# Patient Record
Sex: Female | Born: 1960
Health system: Southern US, Community
[De-identification: ages and names within clinical notes are randomized; demographics above are authoritative.]

## PROBLEM LIST (undated history)

## (undated) DIAGNOSIS — E538 Deficiency of other specified B group vitamins: Secondary | ICD-10-CM

## (undated) DIAGNOSIS — J302 Other seasonal allergic rhinitis: Secondary | ICD-10-CM

## (undated) DIAGNOSIS — E1110 Type 2 diabetes mellitus with ketoacidosis without coma: Secondary | ICD-10-CM

## (undated) DIAGNOSIS — I10 Essential (primary) hypertension: Secondary | ICD-10-CM

## (undated) DIAGNOSIS — K7030 Alcoholic cirrhosis of liver without ascites: Secondary | ICD-10-CM

## (undated) DIAGNOSIS — K860 Alcohol-induced chronic pancreatitis: Secondary | ICD-10-CM

## (undated) DIAGNOSIS — A0472 Enterocolitis due to Clostridium difficile, not specified as recurrent: Secondary | ICD-10-CM

## (undated) DIAGNOSIS — L02212 Cutaneous abscess of back [any part, except buttock]: Secondary | ICD-10-CM

## (undated) DIAGNOSIS — G629 Polyneuropathy, unspecified: Secondary | ICD-10-CM

## (undated) DIAGNOSIS — M5030 Other cervical disc degeneration, unspecified cervical region: Secondary | ICD-10-CM

## (undated) DIAGNOSIS — S060XAA Concussion with loss of consciousness status unknown, initial encounter: Secondary | ICD-10-CM

## (undated) DIAGNOSIS — S060X9A Concussion with loss of consciousness of unspecified duration, initial encounter: Secondary | ICD-10-CM

## (undated) DIAGNOSIS — E119 Type 2 diabetes mellitus without complications: Secondary | ICD-10-CM

## (undated) HISTORY — DX: Enterocolitis due to Clostridium difficile, not specified as recurrent: A04.72

## (undated) HISTORY — PX: TONSILLECTOMY: SUR1361

## (undated) HISTORY — DX: Cutaneous abscess of back (any part, except buttock): L02.212

## (undated) HISTORY — DX: Alcoholic cirrhosis of liver without ascites: K70.30

## (undated) HISTORY — DX: Alcohol-induced chronic pancreatitis: K86.0

## (undated) HISTORY — DX: Type 2 diabetes mellitus without complications: E11.9

## (undated) HISTORY — DX: Essential (primary) hypertension: I10

---

## 1989-01-05 HISTORY — PX: LEEP: SHX91

## 1998-05-07 HISTORY — PX: REFRACTIVE SURGERY: SHX103

## 2008-09-04 HISTORY — PX: ORIF FOOT FRACTURE: SHX2123

## 2008-09-29 ENCOUNTER — Ambulatory Visit: Payer: Self-pay | Admitting: Family Medicine

## 2008-09-29 DIAGNOSIS — I152 Hypertension secondary to endocrine disorders: Secondary | ICD-10-CM | POA: Insufficient documentation

## 2008-09-29 DIAGNOSIS — E1159 Type 2 diabetes mellitus with other circulatory complications: Secondary | ICD-10-CM | POA: Insufficient documentation

## 2008-09-29 DIAGNOSIS — R87619 Unspecified abnormal cytological findings in specimens from cervix uteri: Secondary | ICD-10-CM | POA: Insufficient documentation

## 2008-09-29 DIAGNOSIS — I10 Essential (primary) hypertension: Secondary | ICD-10-CM | POA: Insufficient documentation

## 2008-09-29 DIAGNOSIS — T678XXA Other effects of heat and light, initial encounter: Secondary | ICD-10-CM | POA: Insufficient documentation

## 2008-10-01 ENCOUNTER — Ambulatory Visit: Payer: Self-pay | Admitting: Family Medicine

## 2008-10-05 LAB — CONVERTED CEMR LAB
Albumin: 4.1 g/dL (ref 3.5–5.2)
Basophils Absolute: 0 10*3/uL (ref 0.0–0.1)
CO2: 28 meq/L (ref 19–32)
Eosinophils Relative: 2.7 % (ref 0.0–5.0)
GFR calc non Af Amer: 82.19 mL/min (ref >60)
Glucose, Bld: 241 mg/dL — ABNORMAL HIGH (ref 70–99)
Hemoglobin: 16 g/dL — ABNORMAL HIGH (ref 12.0–15.0)
LH: 4.68 milliintl units/mL
Lymphs Abs: 1.5 10*3/uL (ref 0.7–4.0)
Monocytes Relative: 4.3 % (ref 3.0–12.0)
Neutro Abs: 8.2 10*3/uL — ABNORMAL HIGH (ref 1.4–7.7)
Neutrophils Relative %: 78.3 % — ABNORMAL HIGH (ref 43.0–77.0)
Platelets: 212 10*3/uL (ref 150.0–400.0)
Sodium: 139 meq/L (ref 135–145)
TSH: 1.58 microintl units/mL (ref 0.35–5.50)
Total CHOL/HDL Ratio: 6
Total Protein: 7.3 g/dL (ref 6.0–8.3)

## 2008-11-02 ENCOUNTER — Encounter: Payer: Self-pay | Admitting: Family Medicine

## 2008-11-03 ENCOUNTER — Ambulatory Visit: Payer: Self-pay | Admitting: Family Medicine

## 2008-11-03 DIAGNOSIS — E7800 Pure hypercholesterolemia, unspecified: Secondary | ICD-10-CM | POA: Insufficient documentation

## 2008-11-03 DIAGNOSIS — E1169 Type 2 diabetes mellitus with other specified complication: Secondary | ICD-10-CM | POA: Insufficient documentation

## 2008-11-03 DIAGNOSIS — E119 Type 2 diabetes mellitus without complications: Secondary | ICD-10-CM | POA: Insufficient documentation

## 2008-11-03 LAB — CONVERTED CEMR LAB
Blood in Urine, dipstick: NEGATIVE
Nitrite: POSITIVE
Urobilinogen, UA: 0.2
WBC Urine, dipstick: NEGATIVE

## 2008-11-04 LAB — CONVERTED CEMR LAB: Hgb A1c MFr Bld: 8.9 % — ABNORMAL HIGH (ref 4.6–6.5)

## 2008-11-16 ENCOUNTER — Telehealth: Payer: Self-pay | Admitting: Family Medicine

## 2008-11-29 ENCOUNTER — Ambulatory Visit: Payer: Self-pay | Admitting: Family Medicine

## 2008-11-29 ENCOUNTER — Encounter: Payer: Self-pay | Admitting: Family Medicine

## 2009-01-05 ENCOUNTER — Ambulatory Visit: Payer: Self-pay | Admitting: Family Medicine

## 2009-01-24 ENCOUNTER — Encounter: Payer: Self-pay | Admitting: Family Medicine

## 2009-02-04 ENCOUNTER — Ambulatory Visit: Payer: Self-pay | Admitting: Family Medicine

## 2009-02-07 ENCOUNTER — Encounter: Payer: Self-pay | Admitting: Family Medicine

## 2009-03-02 ENCOUNTER — Ambulatory Visit: Payer: Self-pay | Admitting: Family Medicine

## 2009-03-02 LAB — CONVERTED CEMR LAB
Albumin: 4.2 g/dL (ref 3.5–5.2)
BUN: 9 mg/dL (ref 6–23)
Basophils Absolute: 0 10*3/uL (ref 0.0–0.1)
CO2: 26 meq/L (ref 19–32)
Calcium: 9.5 mg/dL (ref 8.4–10.5)
Cholesterol: 180 mg/dL (ref 0–200)
Creatinine, Ser: 0.8 mg/dL (ref 0.4–1.2)
Glucose, Bld: 157 mg/dL — ABNORMAL HIGH (ref 70–99)
HCT: 41.4 % (ref 36.0–46.0)
Hemoglobin: 14.2 g/dL (ref 12.0–15.0)
Hgb A1c MFr Bld: 7.5 % — ABNORMAL HIGH (ref 4.6–6.5)
LDL Cholesterol: 97 mg/dL (ref 0–99)
Lymphocytes Relative: 25.3 % (ref 12.0–46.0)
MCHC: 34.4 g/dL (ref 30.0–36.0)
MCV: 93.1 fL (ref 78.0–100.0)
Neutrophils Relative %: 65.1 % (ref 43.0–77.0)
Platelets: 217 10*3/uL (ref 150.0–400.0)
RDW: 11.3 % — ABNORMAL LOW (ref 11.5–14.6)
Total CHOL/HDL Ratio: 4
WBC: 6.8 10*3/uL (ref 4.5–10.5)

## 2009-04-04 ENCOUNTER — Ambulatory Visit: Payer: Self-pay | Admitting: Family Medicine

## 2009-04-06 ENCOUNTER — Ambulatory Visit: Payer: Self-pay | Admitting: Family Medicine

## 2009-05-10 ENCOUNTER — Ambulatory Visit: Payer: Self-pay | Admitting: Family Medicine

## 2009-05-10 ENCOUNTER — Other Ambulatory Visit: Admission: RE | Admit: 2009-05-10 | Discharge: 2009-05-10 | Payer: Self-pay | Admitting: Family Medicine

## 2009-05-10 DIAGNOSIS — N920 Excessive and frequent menstruation with regular cycle: Secondary | ICD-10-CM | POA: Insufficient documentation

## 2009-05-10 LAB — CONVERTED CEMR LAB: Pap Smear: NORMAL

## 2009-05-10 LAB — HM DIABETES FOOT EXAM

## 2009-05-12 ENCOUNTER — Encounter: Admission: RE | Admit: 2009-05-12 | Discharge: 2009-05-12 | Payer: Self-pay | Admitting: Family Medicine

## 2009-05-12 LAB — CONVERTED CEMR LAB
Basophils Absolute: 0.2 10*3/uL — ABNORMAL HIGH (ref 0.0–0.1)
Basophils Relative: 3.2 % — ABNORMAL HIGH (ref 0.0–3.0)
Eosinophils Absolute: 0.2 10*3/uL (ref 0.0–0.7)
Eosinophils Relative: 2.4 % (ref 0.0–5.0)
INR: 1 (ref 0.8–1.0)
MCHC: 33.2 g/dL (ref 30.0–36.0)
MCV: 93.8 fL (ref 78.0–100.0)
Monocytes Relative: 2.9 % — ABNORMAL LOW (ref 3.0–12.0)
Neutrophils Relative %: 76.5 % (ref 43.0–77.0)
Platelets: 207 10*3/uL (ref 150.0–400.0)

## 2009-05-16 ENCOUNTER — Encounter (INDEPENDENT_AMBULATORY_CARE_PROVIDER_SITE_OTHER): Payer: Self-pay | Admitting: *Deleted

## 2009-05-19 ENCOUNTER — Telehealth: Payer: Self-pay | Admitting: Family Medicine

## 2009-05-30 ENCOUNTER — Ambulatory Visit: Payer: Self-pay | Admitting: Family Medicine

## 2009-06-07 ENCOUNTER — Ambulatory Visit: Payer: Self-pay | Admitting: Family Medicine

## 2009-06-14 ENCOUNTER — Ambulatory Visit: Payer: Self-pay | Admitting: Obstetrics and Gynecology

## 2009-07-05 ENCOUNTER — Ambulatory Visit: Payer: Self-pay | Admitting: Obstetrics and Gynecology

## 2009-07-11 ENCOUNTER — Ambulatory Visit: Payer: Self-pay | Admitting: Family Medicine

## 2009-07-12 ENCOUNTER — Ambulatory Visit: Payer: Self-pay | Admitting: Family Medicine

## 2009-07-12 ENCOUNTER — Encounter: Payer: Self-pay | Admitting: Family Medicine

## 2009-07-13 LAB — CONVERTED CEMR LAB
HDL: 60.1 mg/dL (ref >39.00)
Hgb A1c MFr Bld: 6.2 % (ref 4.6–6.5)
Triglycerides: 188 mg/dL — ABNORMAL HIGH (ref 0.0–149.0)

## 2009-08-05 ENCOUNTER — Ambulatory Visit: Payer: Self-pay | Admitting: Family Medicine

## 2009-09-23 ENCOUNTER — Ambulatory Visit: Payer: Self-pay | Admitting: Family Medicine

## 2009-09-23 DIAGNOSIS — F411 Generalized anxiety disorder: Secondary | ICD-10-CM | POA: Insufficient documentation

## 2010-06-06 NOTE — Assessment & Plan Note (Signed)
Summary: diabetes/hmw   Vital Signs:  Patient profile:   50 year old female Weight:      220 pounds Temp:     98 degrees F oral Pulse rate:   84 / minute Pulse rhythm:   regular BP sitting:   140 / 100  (left arm) Cuff size:   large  Vitals Entered By: Lowella Petties CMA (Sep 23, 2009 9:17 AM) CC: Diabetes follow up, Hypertension Management   History of Present Illness: DM, well controlled: Imporved on metformin and with diet changes.  8 lb weight loss in last 6 months.  No lows lesss than 60.   Has increased exercise.     Hypertension History:      In pain today with menstrual cramps severe..has appt today with GYN for D and C. Bps usually at home  134/77.        Positive major cardiovascular risk factors include diabetes, hyperlipidemia, and hypertension.  Negative major cardiovascular risk factors include female age less than 65 years old and non-tobacco-user status.     Problems Prior to Update: 1)  Routine Gynecological Examination  (ICD-V72.31) 2)  Preventive Health Care  (ICD-V70.0) 3)  Other Screening Mammogram  (ICD-V76.12) 4)  Excessive or Frequent Menstruation  (ICD-626.2) 5)  Hypercholesterolemia  (ICD-272.0) 6)  Diabetes Mellitus, Type II  (ICD-250.00) 7)  Heat Intolerance  (ICD-992.8) 8)  Pap Smear, Abnormal  (ICD-795.00) 9)  Hypertension  (ICD-401.9)  Current Medications (verified): 1)  Lisinopril 20 Mg Tabs (Lisinopril) .Marland Kitchen.. 1 Tab By Mouth Daily 2)  Vicodin 5-500 Mg Tabs (Hydrocodone-Acetaminophen) .... As Needed For Current Broken Foot 3)  Simvastatin 40 Mg Tabs (Simvastatin) .... Take 1 Tablet By Mouth Once A Day 4)  Metformin Hcl 500 Mg Xr24h-Tab (Metformin Hcl) .... Take 2 Tablet By Mouth Two Times  A Day 5)  Onetouch Ultra Test  Strp (Glucose Blood) .... Check Blood Sugars Two Times A Day 6)  Onetouch Ultrasoft Lancets  Misc (Lancets) .... Check Blood Sugars Two Times A Day  Allergies (verified): No Known Drug Allergies  Past History:  Past  medical, surgical, family and social histories (including risk factors) reviewed, and no changes noted (except as noted below).  Past Medical History: Reviewed history from 09/29/2008 and no changes required. Hypertension  Past Surgical History: Reviewed history from 09/29/2008 and no changes required. Left 5th metatarsal fracture 09/2008 Tonsillectomy LEEP  early 1990s  Family History: Reviewed history from 09/29/2008 and no changes required. father: CVA after hernia surgery, prostate cancer global transient amnesia unclear source mother: tachycardia.Marland KitchenMarland Kitchen? afib sister: a fib 2 brother: healthy PGM: CAD, MI age 58 MGF: ? brain cancer PGF: ? cancer MGM: breast cancer  Social History: Reviewed history from 09/29/2008 and no changes required. 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 CV:  Denies chest pain or discomfort. Resp:  Denies shortness of breath. GI:  Denies abdominal pain. GU:  Denies dysuria. Psych:  anxiety prior to presentations. .  Physical Exam  General:  overweight appearing female in NAD Mouth:  MMM Neck:  no carotid bruit or thyromegaly no cervical or supraclavicular lymphadenopathy  Lungs:  Normal respiratory effort, chest expands symmetrically. Lungs are clear to auscultation, no crackles or wheezes. Heart:  Normal rate and regular rhythm. S1 and S2 normal without gallop, murmur, click, rub or other extra sounds.  Impression & Recommendations:  Problem # 1:  DIABETES MELLITUS, TYPE II (ICD-250.00)  Great improvement. Cotinue current medicaiton. Encouraged exercise, weight loss, healthy eating habits.  Her updated medication list for this problem includes:    Lisinopril 20 Mg Tabs (Lisinopril) .Marland Kitchen... 1 tab by mouth daily    Metformin Hcl 500 Mg  Xr24h-tab (Metformin hcl) .Marland Kitchen... Take 2 tablet by mouth two times  a day  Labs Reviewed: Creat: 0.8 (03/02/2009)    Reviewed HgBA1c results: 6.2 (07/11/2009)  7.5 (03/02/2009)  Problem # 2:  HYPERCHOLESTEROLEMIA (ICD-272.0) Assessment: Improved Well controlled. Continue current medication.  Her updated medication list for this problem includes:    Simvastatin 40 Mg Tabs (Simvastatin) .Marland Kitchen... Take 1 tablet by mouth once a day  Labs Reviewed: SGOT: 38 (03/02/2009)   SGPT: 48 (03/02/2009)  10 Yr Risk Heart Disease: 7 % Prior 10 Yr Risk Heart Disease: 11 % (05/10/2009)   HDL:60.10 (07/11/2009), 43.90 (03/02/2009)  LDL:95 (07/11/2009), 97 (03/02/2009)  Chol:193 (07/11/2009), 180 (03/02/2009)  Trig:188.0 (07/11/2009), 196.0 (03/02/2009)  Problem # 3:  HYPERTENSION (ICD-401.9) Elevated today due to pain. Using propranolol for situational anxiety.  Follow BPs at home..will increase lisinopril if Bps remain >130/80 Her updated medication list for this problem includes:    Lisinopril 20 Mg Tabs (Lisinopril) .Marland Kitchen... 1 tab by mouth daily    Propranolol Hcl 20 Mg Tabs (Propranolol hcl) .Marland Kitchen... 1-2 tab by mouth daily as needed anxiety associated with presentations  Problem # 4:  ANXIETY, SITUATIONAL (ICD-308.3) prior to presentations. Trial of propranolol.   Complete Medication List: 1)  Lisinopril 20 Mg Tabs (Lisinopril) .Marland Kitchen.. 1 tab by mouth daily 2)  Vicodin 5-500 Mg Tabs (Hydrocodone-acetaminophen) .... As needed for current broken foot 3)  Simvastatin 40 Mg Tabs (Simvastatin) .... Take 1 tablet by mouth once a day 4)  Metformin Hcl 500 Mg Xr24h-tab (Metformin hcl) .... Take 2 tablet by mouth two times  a day 5)  Onetouch Ultra Test Strp (Glucose blood) .... Check blood sugars two times a day 6)  Onetouch Ultrasoft Lancets Misc (Lancets) .... Check blood sugars two times a day 7)  Propranolol Hcl 20 Mg Tabs (Propranolol hcl) .Marland Kitchen.. 1-2 tab by mouth daily as needed anxiety associated with  presentations  Hypertension Assessment/Plan:      The patient's hypertensive risk group is category C: Target organ damage and/or diabetes.  Her calculated 10 year risk of coronary heart disease is 7 %.  Today's blood pressure is 140/100.  Her blood pressure goal is < 130/80.  Patient Instructions: 1)  Consider fish oil 2000 mg dvided daily... DHA and EPA. 2)  Continue to make diet changes, increase exercsie and lose weight. 3)  Follow BP at home Goal <130/80. 4)    5)  Please schedule a follow-up appointment in 3 months .  6)  BMP prior to visit, ICD-9: 250.00 7)  Hepatic Panel prior to visit ICD-9:  8)  Lipid panel prior to visit ICD-9 :  9)  HgBA1c prior to visit  ICD-9:  10)  Urine Microalbumin prior to visit ICD-9 :  Prescriptions: PROPRANOLOL HCL 20 MG TABS (PROPRANOLOL HCL) 1-2 tab by mouth daily as needed anxiety associated with presentations  #30 x 0   Entered and Authorized by:   Kerby Nora MD   Signed by:   Kerby Nora MD on 09/23/2009   Method used:   Electronically to        [facility] [id number][facility] (retail)  [address]       [place], [place]  [address]       Ph: [phone] or [phone]       Fax: [phone]   RxID:   [id number] LISINOPRIL 20 MG TABS (LISINOPRIL) 1 tab by mouth daily  #90 x 3   Entered and Authorized by:   Kerby Nora MD   Signed by:   Kerby Nora MD on 09/23/2009   Method used:   [facility]ron[facility] to        [facility] [id number] (retail)       [address]       [place], [place]  [address]       Ph: [phone] or [phone]       Fax: [phone]   RxID:   [id number] METFORMIN HCL 500 MG XR24H-TAB (METFORMIN HCL) Take 2 tablet by mouth two times  a day  #360 x 3   Entered and Authorized by:   Kerby Nora MD   Signed by:   Kerby Nora MD on 09/23/2009   Method used:   [facility]        [facility] [id number] ([facility]       [address]       [place], [place]  [address]       Ph:  [phone] or [phone]       Fax: [phone]   RxID:   [id number] SIMVASTATIN 40 MG TABS (SIMVASTATIN) Take 1 tablet by mouth once a day  #90 x 3   Entered and Authorized by:   Kerby Nora MD   Signed by:   Kerby Nora MD on 09/23/2009   Method used:   [facility]        [facility] [id number] ([facility]ail)       [address]       [place], [place]  [address]       Ph: [phone] or [phone]       Fax: [phone]   RxID:   [id number]   Prior Medications (reviewed today): LISINOPRIL 20 MG TABS (LISINOPRIL) 1 tab by mouth daily VICODIN 5-500 MG TABS (HYDROCODONE-ACETAMINOPHEN) as needed for current broken foot SIMVASTATIN 40 MG TABS (SIMVASTATIN) Take 1 tablet by mouth once a day METFORMIN HCL 500 MG XR24H-TAB (METFORMIN HCL) Take 2 tablet by mouth two times  a day ONETOUCH ULTRA TEST  STRP (GLUCOSE BLOOD) Check blood sugars two times a day ONETOUCH ULTRASOFT LANCETS  MISC (LANCETS) Check blood sugars two times a day Current Allergies (reviewed today): No known allergies

## 2010-06-06 NOTE — Letter (Signed)
Summary: Results Follow up Letter  [facility]  [address] [place], [place] [address]   Phone: [phone]  Fax: [phone]    05/16/2009 MRN: [id number]     Mallory Stewart [address] [place], [place]  [address]    Dear Ms. Sprigg,  The following are the results of your recent test(s):  Test         Result    Pap Smear:        Normal __x___  Not Normal _____ Comments:Repeat in 1 year ______________________________________________________ Cholesterol: LDL(Bad cholesterol):         Your goal is less than:         HDL (Good cholesterol):       Your goal is more than: Comments:  ______________________________________________________ Mammogram:        Normal _____  Not Normal _____ Comments:  ___________________________________________________________________ Hemoccult:        Normal _____  Not normal _______ Comments:    _____________________________________________________________________ Other Tests:    We routinely do not discuss normal results over the telephone.  If you desire a copy of the results, or you have any questions about this information we can discuss them at your next office visit.   Sincerely,   Kerby Nora MD

## 2010-06-06 NOTE — Consult Note (Signed)
Summary: [facility]  [facility]   Imported By: Lanelle Bal 07/20/2009 13:33:05  _____________________________________________________________________  External Attachment:    Type:   Image     Comment:   External Document  Appended Document: [facility] Notify pt she needs to reschedule her 3 month DM follow up to discuss labs and BP elevation at nurtitionist visit.   Appended Document: [facility] Patient made appt for next week

## 2010-06-06 NOTE — Assessment & Plan Note (Signed)
Summary: CPX Mallory Stewart   Vital Signs:  Patient profile:   50 year old female Height:      70 inches Weight:      228.6 pounds BMI:     32.92 Temp:     98.9 degrees F oral Pulse rate:   80 / minute Pulse rhythm:   regular BP sitting:   150 / 80  (left arm) Cuff size:   regular  Vitals Entered By: Benny Lennert CMA Duncan Dull) (May 10, 2009 2:08 PM)  History of Present Illness: Chief complaint cpx with pap  DM, improving control on metformin FBS 136-166... 2 hour post prandial 147-187   Hypertension History:      She denies headache, chest pain, and palpitations.  BP at home 130/70 regularly.        Positive major cardiovascular risk factors include diabetes, hyperlipidemia, and hypertension.  Negative major cardiovascular risk factors include female age less than 46 years old and non-tobacco-user status.     Problems Prior to Update: 1)  Hypercholesterolemia  (ICD-272.0) 2)  Diabetes Mellitus, Type II  (ICD-250.00) 3)  Heat Intolerance  (ICD-992.8) 4)  Pap Smear, Abnormal  (ICD-795.00) 5)  Hypertension  (ICD-401.9)  Current Medications (verified): 1)  Lisinopril 20 Mg Tabs (Lisinopril) .Marland Kitchen.. 1 Tab By Mouth Daily 2)  Vicodin 5-500 Mg Tabs (Hydrocodone-Acetaminophen) .... As Needed For Current Broken Foot 3)  Simvastatin 40 Mg Tabs (Simvastatin) .... Take 1 Tablet By Mouth Once A Day 4)  Metformin Hcl 500 Mg Xr24h-Tab (Metformin Hcl) .... Take 2 Tablet By Mouth Two Times  A Day 5)  Onetouch Ultra Test  Strp (Glucose Blood) .... Check Blood Sugars Two Times A Day 6)  Onetouch Ultrasoft Lancets  Misc (Lancets) .... Check Blood Sugars Two Times A Day  Allergies (verified): No Known Drug Allergies  Past History:  Past medical, surgical, family and social histories (including risk factors) reviewed, and no changes noted (except as noted below).  Past Medical History: Reviewed history from 09/29/2008 and no changes required. Hypertension  Past Surgical History: Reviewed  history from 09/29/2008 and no changes required. Left 5th metatarsal fracture 09/2008 Tonsillectomy LEEP  early 1990s  Family History: Reviewed history from 09/29/2008 and no changes required. father: CVA after hernia surgery, prostate cancer global transient amnesia unclear source mother: tachycardia.Marland KitchenMarland Kitchen? afib sister: a fib 2 brother: healthy PGM: CAD, MI age 59 MGF: ? brain cancer PGF: ? cancer MGM: breast cancer  Social History: Reviewed history from 09/29/2008 and no changes required. 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 CV:  Denies chest pain or discomfort. Resp:  Denies shortness of breath. GI:  Denies abdominal pain, bloody stools, constipation, and diarrhea. GU:  Complains of abnormal vaginal bleeding; denies dysuria; Since starting recent meds..in first 2 days using 1 tampon every 1-2 hours, soaked Also increased cramping with menses. Menses occuring once a month lasting 7 days. Previous light and regular.. MS:  CAM walkker off left foot 2 weeks ago..now starting to exercise again.Marland Kitchen Neuro:  Denies falling down, headaches, poor balance, sensation of room spinning, tingling, and tremors; no lightheadedness. Psych:  Denies anxiety and depression.  Physical Exam  General:  overweight appearing female in NAD Ears:  External ear exam shows no significant lesions or deformities.  Otoscopic examination reveals clear canals, tympanic membranes are intact bilaterally without  bulging, retraction, inflammation or discharge. Hearing is grossly normal bilaterally. Nose:  External nasal examination shows no deformity or inflammation. Nasal mucosa are pink and moist without lesions or exudates. Mouth:  MMM Neck:  no carotid bruit or thyromegaly no cervical or supraclavicular  lymphadenopathy  Chest Wall:  No deformities, masses, or tenderness noted. Breasts:  No mass, nodules, thickening, tenderness, bulging, retraction, inflamation, nipple discharge or skin changes noted.   Lungs:  Normal respiratory effort, chest expands symmetrically. Lungs are clear to auscultation, no crackles or wheezes. Heart:  Normal rate and regular rhythm. S1 and S2 normal without gallop, murmur, click, rub or other extra sounds. Abdomen:  Bowel sounds positive,abdomen soft and non-tender without masses, organomegaly or hernias noted. Genitalia:  Pelvic Exam:        External: normal female genitalia without lesions or masses        Vagina: normal without lesions or masses        Cervix: normal without lesions or masses        Adnexa: normal bimanual exam without masses or fullness        Uterus: normal by palpation        Pap smear: performed Pulses:  R and L posterior tibial pulses are full and equal bilaterally  Extremities:  no edema Skin:  Intact without suspicious lesions or rashes Psych:  Cognition and judgment appear intact. Alert and cooperative with normal attention span and concentration. No apparent delusions, illusions, hallucinations  Diabetes Management Exam:    Foot Exam (with socks and/or shoes not present):       Sensory-Pinprick/Light touch:          Left medial foot (L-4): normal          Left dorsal foot (L-5): normal          Left lateral foot (S-1): normal          Right medial foot (L-4): normal          Right dorsal foot (L-5): normal          Right lateral foot (S-1): normal       Sensory-Monofilament:          Left foot: normal          Right foot: normal       Inspection:          Left foot: normal          Right foot: normal       Nails:          Left foot: normal          Right foot: normal   Impression & Recommendations:  Problem # 1:  Preventive Health Care (ICD-V70.0) Reviewed preventive care protocols, scheduled due services, and updated  immunizations. Encouraged exercise, weight loss, healthy eating habits.   Problem # 2:  Gynecological examination-routine (ICD-V72.31) PAP pending.   Problem # 3:  EXCESSIVE OR FREQUENT MENSTRUATION (ICD-626.2) ? due to perimenopausal status.  Will eval for anemia, clotting disorder and thyroid disfunction. Send for pelvic US..no uterine enlargement palpated on exam.   Orders: TLB-TSH (Thyroid Stimulating Hormone) (84443-TSH) TLB-CBC Platelet - w/Differential (85025-CBCD) TLB-PTT (85730-PTTL) TLB-PT (Protime) (85610-PTP) Radiology Referral (Radiology)  Problem # 4:  DIABETES MELLITUS, TYPE II (ICD-250.00) Improving control..due for A1C in 1 month.  Her updated medication list for this problem includes:    Lisinopril 20 Mg Tabs (Lisinopril) .Marland Kitchen... 1 tab by mouth daily    Metformin Hcl 500 Mg Xr24h-tab (  Metformin hcl) .Marland Kitchen... Take 2 tablet by mouth two times  a day  Problem # 5:  HYPERCHOLESTEROLEMIA (ICD-272.0) At goal on current meds.  Her updated medication list for this problem includes:    Simvastatin 40 Mg Tabs (Simvastatin) .Marland Kitchen... Take 1 tablet by mouth once a day  Problem # 6:  HYPERTENSION (ICD-401.9) Well controlled at home on current medication.  Her updated medication list for this problem includes:    Lisinopril 20 Mg Tabs (Lisinopril) .Marland Kitchen... 1 tab by mouth daily  Complete Medication List: 1)  Lisinopril 20 Mg Tabs (Lisinopril) .Marland Kitchen.. 1 tab by mouth daily 2)  Vicodin 5-500 Mg Tabs (Hydrocodone-acetaminophen) .... As needed for current broken foot 3)  Simvastatin 40 Mg Tabs (Simvastatin) .... Take 1 tablet by mouth once a day 4)  Metformin Hcl 500 Mg Xr24h-tab (Metformin hcl) .... Take 2 tablet by mouth two times  a day 5)  Onetouch Ultra Test Strp (Glucose blood) .... Check blood sugars two times a day 6)  Onetouch Ultrasoft Lancets Misc (Lancets) .... Check blood sugars two times a day  Hypertension Assessment/Plan:      The patient's hypertensive risk group is category  C: Target organ damage and/or diabetes.  Her calculated 10 year risk of coronary heart disease is 11 %.  Today's blood pressure is 150/80.  Her blood pressure goal is < 130/80.  Patient Instructions: 1)  Referral Appointment Information 2)  Day/Date: 3)  Time: 4)  Place/MD: 5)  Address: 6)  Phone/Fax: 7)  Patient given appointment information. Information/Orders faxed/mailed.  8)  Fasting lipids, A1C in 1 month. 9)  1 Month follow up DM appt.  Current Allergies (reviewed today): No known allergies      Influenza Vaccine (to be given today)

## 2010-06-06 NOTE — Progress Notes (Signed)
Summary: Request MGM results  Phone Note Call from Patient   Caller: Patient Call For: Kerby Nora MD Summary of Call: Patient calling for her mammogram results.  Initial call taken by: Sydell Axon LPN,  May 19, 2009 10:25 AM  Follow-up for Phone Call        thought patient was already notified but seh wants reaults Follow-up by: Benny Lennert CMA Duncan Dull),  May 19, 2009 11:18 AM  Additional Follow-up for Phone Call Additional follow up Details #1::        called patient but got message  Additional Follow-up by: Benny Lennert CMA Duncan Dull),  May 20, 2009 9:51 AM    Additional Follow-up for Phone Call Additional follow up Details #2::    Patient notified, normal mammogram.  Repeat in one year Follow-up by: Linde Gillis CMA Duncan Dull),  May 20, 2009 2:13 PM

## 2010-07-11 ENCOUNTER — Ambulatory Visit: Payer: Self-pay | Admitting: Obstetrics and Gynecology

## 2010-07-18 ENCOUNTER — Ambulatory Visit: Payer: Self-pay | Admitting: Obstetrics and Gynecology

## 2010-09-08 ENCOUNTER — Telehealth: Payer: Self-pay | Admitting: *Deleted

## 2010-09-08 DIAGNOSIS — E7800 Pure hypercholesterolemia, unspecified: Secondary | ICD-10-CM

## 2010-09-08 DIAGNOSIS — E119 Type 2 diabetes mellitus without complications: Secondary | ICD-10-CM

## 2010-09-08 NOTE — Telephone Encounter (Signed)
Pt is coming in on 6/29 for a diabetes follow up.  She is asking if she should come for labs prior to that visit.  Please advise.

## 2010-09-08 NOTE — Telephone Encounter (Signed)
Yes Should have been scheduled per pt instructions when she left last time.

## 2010-09-11 NOTE — Telephone Encounter (Signed)
Patient advised via message on home machine 

## 2010-09-19 NOTE — Assessment & Plan Note (Signed)
NAMEKANAE, IGNATOWSKI NO.:  [id number]   MEDICAL RECORD NO.:  [id number]          PATIENT TYPE:  POB   LOCATION:  [facility] at [facility]         FACILITY:  [facility]   PHYSICIAN:  Catalina Antigua, MD     DATE OF BIRTH:  1961-01-28   DATE OF SERVICE:  06/14/2009                                  CLINIC NOTE   This is a 50 year old para 0 with LMP on May 26, 2009, who presents  for evaluation of 4-month history of dysfunctional uterine bleeding.  The patient states that she noted that approximately 6 months ago, her  periods became heavy and associated with severe cramping pain.  The  patient reports a lifelong history of irregular bleeding occurring  roughly anywhere between 28-35 days, but over the past 6 months states  they are occurring every 20 days lasting 7-8 days and she goes through a  box of super absorbent tampons.  The patient otherwise denies any pain  in between her periods and her pain is well controlled with heavy doses  of ibuprofen and Vicodin which was prescribed to her after an orthopedic  injury.   PAST MEDICAL HISTORY:  Significant for hypertension, diabetes and high  cholesterol.   PAST SURGICAL HISTORY:  She denies.   PAST OBSTETRICAL HISTORY:  She denies any history of previous  pregnancies or miscarriages.   PAST GYNECOLOGIC HISTORY:  She has a history of abnormal Pap smear,  treated with a LEEP procedure many years ago and has since had normal  Pap smear.  She denies any history of cyst or fibroids or history of any  STDs.  Her last Pap smear was performed on May 12, 2009, and was  reportedly normal and she had a mammogram on May 16, 2009, which was  also normal.   ALLERGIES:  She denies any allergies to medications.   CURRENT MEDICATIONS:  Metformin, lisinopril and simvastatin.   FAMILY HISTORY:  She has a family history of heart attack in her  paternal grandmother and history of lung and prostate cancer in her  father and  her maternal grandmother.   SOCIAL HISTORY:  She denies smoking or drinking.  She is only a social  drinker.  She denies the use of illicit drug use.   REVIEW OF SYSTEMS:  Otherwise within normal minutes.  She does reports  occasional hot flashes which have significantly improved over the past 3  months.   PHYSICAL EXAMINATION:  VITAL SIGNS:  Her blood pressure was 171/115,  pulse of 101, weight of 226 pounds, height of 5 feet and 9-1/2 inches.  LUNGS:  Clear to auscultation bilaterally.  HEART:  Regular rate and rhythm.  ABDOMEN:  Soft, nontender, and nondistended.  PELVIC:  Bimanual exam, she had normal-appearing vaginal mucosa, normal-  appearing cervix.  No abnormal discharge or bleeding and on bimanual,  she has approximately 9-10 weeks size anteverted uterus.  No palpable  adnexal masses or tenderness.   The patient had an ultrasound performed on January 12 which showed a  normal-sized uterus and endometrial lining measuring 6.7 mm.  There is  an area of shadowing and slight irregularity within the fundal and  endometrium where submucosal  fibroids versus polyp cannot be excluded.  The adnexa were normal in size.  No free fluid was seen.   ASSESSMENT AND PLAN:  This is a 50 year old para 0 with 7-month history  of dysfunctional uterine bleeding, who presents for evaluation and  management.  Endometrial biopsy was performed today.  The patient will  return for discussion of her results and further planning.  The patient  plans on taking a 2-week cruise to the [place] and will return at the end  of February and she will be seen then.  In the meantime, medical  management with Depo-Provera and Mirena IUD were discussed as well as  surgical management including D and C hysteroscopy with resectoscope or  endometrial ablation, even hysterectomy were discussed as well.  At this  point in time, the patient is more inclined to start with medical  management with Mirena IUD.  An  information packet was provided, a  prescription for Vicodin was also provided to the patient to help her  deal with her pain while awaiting the results of the biopsy and medical  management should be implemented.           ______________________________  Catalina Antigua, MD     PC/MEDQ  D:  06/14/2009  T:  06/15/2009  Job:  [id number]

## 2010-09-19 NOTE — Assessment & Plan Note (Signed)
Mallory Stewart, Mallory Stewart            ACCOUNT NO.:  [id number]   MEDICAL RECORD NO.:  [id number]          PATIENT TYPE:  POB   LOCATION:  [facility]         FACILITY:  [facility]   PHYSICIAN:  Catalina Antigua, MD     DATE OF BIRTH:  11-28-1960   DATE OF SERVICE:  07/05/2009                                  CLINIC NOTE   This is a 50 year old para 1 with LMP on May 26, 2009, who presents  today for followup on her endometrial biopsy and further management of  her menometrorrhagia.  The patient reports that she did not have a  period for the month of February and has been without any complaints of  symptoms since that time.  Results of the endometrial biopsy were  reviewed which demonstrated a small polypoid fragment of benign  proliferative endometrium.  At her previous visit, medical management  with Mirena IUD was discussed.  At this time, the patient would like to  observe with her cycles, will be coming in the next few months before  committing to Mirena IUD insertion.  She feels that perhaps she is  entering the menopausal phase and would like to wait and see.  The  patient will schedule an appointment at her own convenient when ready  for Mirena IUD insertion.  The patient was encouraged to continue the  consistent use of condoms as to prevent STDs or pregnancy.           ______________________________  Catalina Antigua, MD     PC/MEDQ  D:  07/05/2009  T:  07/06/2009  Job:  [id number]

## 2010-10-05 ENCOUNTER — Other Ambulatory Visit: Payer: Self-pay | Admitting: Family Medicine

## 2010-10-05 NOTE — Telephone Encounter (Signed)
Medication reviewed from EMR and they were correct also patient has appt for follow up on 11-03-2010. Refills approved

## 2010-10-28 ENCOUNTER — Encounter: Payer: Self-pay | Admitting: Family Medicine

## 2010-11-03 ENCOUNTER — Ambulatory Visit: Payer: Self-pay | Admitting: Family Medicine

## 2010-12-04 ENCOUNTER — Other Ambulatory Visit: Payer: Self-pay

## 2010-12-05 ENCOUNTER — Ambulatory Visit: Payer: Self-pay | Admitting: Family Medicine

## 2010-12-11 ENCOUNTER — Other Ambulatory Visit: Payer: Self-pay | Admitting: Family Medicine

## 2010-12-12 ENCOUNTER — Other Ambulatory Visit: Payer: Self-pay | Admitting: *Deleted

## 2010-12-12 MED ORDER — GLUCOSE BLOOD VI STRP: ORAL_STRIP | Status: AC

## 2011-01-02 ENCOUNTER — Other Ambulatory Visit: Payer: Self-pay

## 2011-01-04 ENCOUNTER — Ambulatory Visit: Payer: Self-pay | Admitting: Family Medicine

## 2011-01-29 ENCOUNTER — Other Ambulatory Visit: Payer: Self-pay

## 2011-01-30 ENCOUNTER — Other Ambulatory Visit (INDEPENDENT_AMBULATORY_CARE_PROVIDER_SITE_OTHER): Payer: BC Managed Care – PPO

## 2011-01-30 ENCOUNTER — Other Ambulatory Visit: Payer: Self-pay

## 2011-01-30 DIAGNOSIS — E7800 Pure hypercholesterolemia, unspecified: Secondary | ICD-10-CM

## 2011-01-30 DIAGNOSIS — E119 Type 2 diabetes mellitus without complications: Secondary | ICD-10-CM

## 2011-01-30 LAB — COMPREHENSIVE METABOLIC PANEL
ALT: 67 U/L — ABNORMAL HIGH (ref 0–35)
AST: 73 U/L — ABNORMAL HIGH (ref 0–37)
Alkaline Phosphatase: 62 U/L (ref 39–117)
CO2: 26 mEq/L (ref 19–32)
Creatinine, Ser: 0.7 mg/dL (ref 0.4–1.2)
Potassium: 5 mEq/L (ref 3.5–5.1)
Total Protein: 7.2 g/dL (ref 6.0–8.3)

## 2011-01-30 LAB — LIPID PANEL
Cholesterol: 168 mg/dL (ref 0–200)
Triglycerides: 100 mg/dL (ref 0.0–149.0)

## 2011-01-30 LAB — HEMOGLOBIN A1C: Hgb A1c MFr Bld: 5.7 % (ref 4.6–6.5)

## 2011-02-01 ENCOUNTER — Ambulatory Visit: Payer: Self-pay | Admitting: Family Medicine

## 2011-02-05 ENCOUNTER — Ambulatory Visit (INDEPENDENT_AMBULATORY_CARE_PROVIDER_SITE_OTHER): Payer: BC Managed Care – PPO | Admitting: Family Medicine

## 2011-02-05 ENCOUNTER — Encounter: Payer: Self-pay | Admitting: Family Medicine

## 2011-02-05 DIAGNOSIS — R748 Abnormal levels of other serum enzymes: Secondary | ICD-10-CM | POA: Insufficient documentation

## 2011-02-05 DIAGNOSIS — R7401 Elevation of levels of liver transaminase levels: Secondary | ICD-10-CM

## 2011-02-05 DIAGNOSIS — R7402 Elevation of levels of lactic acid dehydrogenase (LDH): Secondary | ICD-10-CM

## 2011-02-05 DIAGNOSIS — E7800 Pure hypercholesterolemia, unspecified: Secondary | ICD-10-CM

## 2011-02-05 DIAGNOSIS — E119 Type 2 diabetes mellitus without complications: Secondary | ICD-10-CM

## 2011-02-05 DIAGNOSIS — I10 Essential (primary) hypertension: Secondary | ICD-10-CM

## 2011-02-05 DIAGNOSIS — S29012A Strain of muscle and tendon of back wall of thorax, initial encounter: Secondary | ICD-10-CM

## 2011-02-05 DIAGNOSIS — S239XXA Sprain of unspecified parts of thorax, initial encounter: Secondary | ICD-10-CM

## 2011-02-05 MED ORDER — SIMVASTATIN 20 MG PO TABS: 20.0000 mg | ORAL_TABLET | Freq: Every day | ORAL | Status: DC

## 2011-02-05 MED ORDER — CYCLOBENZAPRINE HCL 5 MG PO TABS: 10.0000 mg | ORAL_TABLET | Freq: Two times a day (BID) | ORAL | Status: DC | PRN

## 2011-02-05 NOTE — Patient Instructions (Addendum)
Decrease metformin to 2 tabs in AM and 1 tab in PM. Goal blood sugar would to have fasting remain consistently  <110... Also call if frequent  FBS <60. Decrease simvastatin to 20 mg daily.  Keep alcohol to on bevarge a day. For back pain: Heat, Massage, muscle relaxant at night, gentle stretching.  Okay to use ibuprofen  mg every 8 hours. Take with food and stop if stomach irritated. Call if not improving in next 1 -2 weeks.

## 2011-02-05 NOTE — Assessment & Plan Note (Signed)
Great control.. Will try to decrease metformin some. Follow up in 6 months.

## 2011-02-05 NOTE — Assessment & Plan Note (Signed)
Great control, but since LFTs elevated...Marland Kitchendecrease to 20 mg daily. If not improved at next OV... Check hepatitis panel and consider abd [place].

## 2011-02-05 NOTE — Assessment & Plan Note (Signed)
Decrease statin to 20 mg daily. If not improved at next OV... Check hepatitis panel and consider abd [place].

## 2011-02-05 NOTE — Assessment & Plan Note (Signed)
Treat with NSIADs, muscle relaxant, vicodin prn, heat , massage and gentle stretching.

## 2011-02-05 NOTE — Progress Notes (Signed)
Subjective:    Patient ID: Mallory Stewart, female    DOB: Oct 09, 1960, 50 y.o.   MRN: [id number]  HPI  Last seen over 1 year ago.... Overdue for CPX.  Diabetes: Great control on glucophage max. Lab Results  Component Value Date   HGBA1C 5.7 01/30/2011  Using medications without difficulties: Good. Hypoglycemic episodes:one measurement 62.. Not sure if it was associated with diet change. Hyperglycemic episodes: None Feet problems: Rare tingling in feet, no pain. Blood Sugars averaging: daily.. FBS 90-130 eye exam within last year:No Eating healthier.  Hypertension:  Poor control on propranolol, lisinopril 20 mg daily Using medication without problems or lightheadedness:  Chest pain with exertion: None Edema:None Short of breath:None Average home BPs: 125/68 at home usually when not in pain.  Other issues: Back pain.. See below  Elevated Cholesterol: LDL at goal on simvastatin 40 mg daily Using medications without problems: LFTs elevated.. Increased from last OV.  Muscle aches:  None Diet compliance: Good Exercise:2-3 times a week. Some wieght lifting.  Other complaints:  Back pain, mid, upper pain.. Since she  Had dog take off after a cat... Pulled suddenly on leash. Having a lot of spasm in back... Took old vicodin, helped pain.  No radicular pain.    Review of Systems     Objective:   Physical Exam  Constitutional: Vital signs are normal. She appears well-developed and well-nourished. She is cooperative.  Non-toxic appearance. She does not appear ill. No distress.  HENT:  Head: Normocephalic.  Right Ear: Hearing, tympanic membrane, external ear and ear canal normal. Tympanic membrane is not erythematous, not retracted and not bulging.  Left Ear: Hearing, tympanic membrane, external ear and ear canal normal. Tympanic membrane is not erythematous, not retracted and not bulging.  Nose: No mucosal edema or rhinorrhea. Right sinus exhibits no maxillary sinus tenderness  and no frontal sinus tenderness. Left sinus exhibits no maxillary sinus tenderness and no frontal sinus tenderness.  Mouth/Throat: Uvula is midline, oropharynx is clear and moist and mucous membranes are normal.  Eyes: Conjunctivae, EOM and lids are normal. Pupils are equal, round, and reactive to light. No foreign bodies found.  Neck: Trachea normal and normal range of motion. Neck supple. Carotid bruit is not present. No mass and no thyromegaly present.  Cardiovascular: Normal rate, regular rhythm, S1 normal, S2 normal, normal heart sounds, intact distal pulses and normal pulses.  Exam reveals no gallop and no friction rub.   No murmur heard. Pulmonary/Chest: Effort normal and breath sounds normal. Not tachypneic. No respiratory distress. She has no decreased breath sounds. She has no wheezes. She has no rhonchi. She has no rales.  Abdominal: Soft. Normal appearance and bowel sounds are normal. There is no tenderness.  Musculoskeletal:       Cervical back: She exhibits decreased range of motion and tenderness. She exhibits no bony tenderness.       Thoracic back: She exhibits decreased range of motion and tenderness. She exhibits no bony tenderness.       Lumbar back: Normal. She exhibits normal range of motion and no tenderness.       Neg Spurling, neg SLR Very tender over trrapezius and paraspinous muscle in thoracic spine.  Neurological: She is alert.  Skin: Skin is warm, dry and intact. No rash noted.  Psychiatric: Her speech is normal and behavior is normal. Judgment and thought content normal. Her mood appears not anxious. Cognition and memory are normal. She does not exhibit a depressed mood.  Diabetic foot exam: Normal inspection No skin breakdown No calluses  Normal DP pulses Normal sensation to light touch and monofilament Nails normal       Assessment & Plan:

## 2011-02-05 NOTE — Assessment & Plan Note (Signed)
Poor control today given pain... Usually well controlled. Continue current meds.

## 2011-02-15 ENCOUNTER — Other Ambulatory Visit: Payer: Self-pay | Admitting: *Deleted

## 2011-02-15 NOTE — Telephone Encounter (Signed)
Pt was seen last week for muscle spasms and given flexeril.  She is still having the spasms but doesn't have anymore medicine.  She's asking for a refill to be called to [facility].

## 2011-02-16 MED ORDER — CYCLOBENZAPRINE HCL 5 MG PO TABS: 10.0000 mg | ORAL_TABLET | Freq: Every evening | ORAL | Status: AC | PRN

## 2011-02-16 MED ORDER — DICLOFENAC SODIUM 75 MG PO TBEC: 75.0000 mg | DELAYED_RELEASE_TABLET | Freq: Two times a day (BID) | ORAL | Status: DC

## 2011-02-16 NOTE — Telephone Encounter (Signed)
Pt called again today requesting a call today regarding RF request.

## 2011-02-16 NOTE — Telephone Encounter (Signed)
Patient advised.

## 2011-02-16 NOTE — Telephone Encounter (Signed)
Given continued issue.. Will refill flexeril, but would change to diclofenac BID instead of ibuproen.. Call if not improving in next week or so.

## 2011-03-19 ENCOUNTER — Ambulatory Visit: Payer: BC Managed Care – PPO | Admitting: Family Medicine

## 2011-03-19 DIAGNOSIS — Z0289 Encounter for other administrative examinations: Secondary | ICD-10-CM

## 2011-03-30 ENCOUNTER — Other Ambulatory Visit: Payer: Self-pay | Admitting: *Deleted

## 2011-03-30 MED ORDER — METFORMIN HCL ER 500 MG PO TB24: ORAL_TABLET | ORAL | Status: DC

## 2011-04-06 ENCOUNTER — Encounter: Payer: Self-pay | Admitting: Family Medicine

## 2011-04-06 ENCOUNTER — Ambulatory Visit (INDEPENDENT_AMBULATORY_CARE_PROVIDER_SITE_OTHER): Payer: BC Managed Care – PPO | Admitting: Family Medicine

## 2011-04-06 VITALS — BP 160/100 | HR 110 | Temp 98.0°F | Ht 69.5 in | Wt 217.8 lb

## 2011-04-06 DIAGNOSIS — R748 Abnormal levels of other serum enzymes: Secondary | ICD-10-CM

## 2011-04-06 DIAGNOSIS — I10 Essential (primary) hypertension: Secondary | ICD-10-CM

## 2011-04-06 DIAGNOSIS — R112 Nausea with vomiting, unspecified: Secondary | ICD-10-CM

## 2011-04-06 DIAGNOSIS — R1310 Dysphagia, unspecified: Secondary | ICD-10-CM | POA: Insufficient documentation

## 2011-04-06 DIAGNOSIS — R7401 Elevation of levels of liver transaminase levels: Secondary | ICD-10-CM

## 2011-04-06 LAB — HEPATIC FUNCTION PANEL
ALT: 89 U/L — ABNORMAL HIGH (ref 0–35)
AST: 142 U/L — ABNORMAL HIGH (ref 0–37)
Albumin: 4.4 g/dL (ref 3.5–5.2)
Total Bilirubin: 0.8 mg/dL (ref 0.3–1.2)
Total Protein: 7.3 g/dL (ref 6.0–8.3)

## 2011-04-06 NOTE — Assessment & Plan Note (Signed)
Well-controlled on current regimen. ?

## 2011-04-06 NOTE — Assessment & Plan Note (Signed)
EGD nml, no hpylori. Barium swallow nml.  Symptoms resolved with steroids and PPI in hospital. Has not used these since home. Has continue ACEI and had no further issue... Will continue this for now but if further issue consider as cause. Resovled at this time... ?cause due to abrupt N/V, acid reflux.

## 2011-04-06 NOTE — Assessment & Plan Note (Signed)
Increased further from last check... Stay off statin Will check hepatitis panel  And hep A IgG if able to determine if recent illness could have been hepatitis.

## 2011-04-06 NOTE — Progress Notes (Signed)
  Subjective:    Patient ID: Mallory Stewart, female    DOB: December 24, 1960, 50 y.o.   MRN: [id number]  HPI  50 year old female presents for hospital follow up.  10/29... Awoke with sudden onset of diarrhea and vomiting. Had had salmon salad.. For dinner? Food poisining.  Felt like something stuck in throat, couldn't swallow. Able to breath.  Went to ER with EMS.  At hospital: BP was elevated. Given IVFs, given steroids, morphine, BP meds. LFTs elevated. AST 153, ALT 128, ALK nml Stopped statin medicaiton.  CT scan of neck : non specific changes Barium swallow normal, no tears. Endoscopy: Saw one area of stricture. Bx of stomach and esophagus  Negative. Hpylori neg.  Feeling of blockage resolved by end of hospital. Had side effects to steroids or other med, rash on face and arms. Resolved with benadryl.  Recommeneded stopping lisinopril.. And to change to norvasc.. She did not do this.  given protonix.. She never started.  She feels completely back to normal at this point. She feeels gereat. FBS and BP at home doing well.     Review of Systems  Constitutional: Negative for fever and fatigue.  HENT: Negative for ear pain.   Eyes: Negative for pain.  Respiratory: Negative for chest tightness and shortness of breath.   Cardiovascular: Negative for chest pain, palpitations and leg swelling.  Gastrointestinal: Negative for abdominal pain.  Genitourinary: Negative for dysuria.       Objective:   Physical Exam  Constitutional: Vital signs are normal. She appears well-developed and well-nourished. She is cooperative.  Non-toxic appearance. She does not appear ill. No distress.  HENT:  Head: Normocephalic.  Right Ear: Hearing, tympanic membrane, external ear and ear canal normal. Tympanic membrane is not erythematous, not retracted and not bulging.  Left Ear: Hearing, tympanic membrane, external ear and ear canal normal. Tympanic membrane is not erythematous, not retracted and not  bulging.  Nose: No mucosal edema or rhinorrhea. Right sinus exhibits no maxillary sinus tenderness and no frontal sinus tenderness. Left sinus exhibits no maxillary sinus tenderness and no frontal sinus tenderness.  Mouth/Throat: Uvula is midline, oropharynx is clear and moist and mucous membranes are normal.  Eyes: Conjunctivae, EOM and lids are normal. Pupils are equal, round, and reactive to light. No foreign bodies found.  Neck: Trachea normal and normal range of motion. Neck supple. Carotid bruit is not present. No mass and no thyromegaly present.  Cardiovascular: Normal rate, regular rhythm, S1 normal, S2 normal, normal heart sounds, intact distal pulses and normal pulses.  Exam reveals no gallop and no friction rub.   No murmur heard. Pulmonary/Chest: Effort normal and breath sounds normal. Not tachypneic. No respiratory distress. She has no decreased breath sounds. She has no wheezes. She has no rhonchi. She has no rales.  Abdominal: Soft. Normal appearance and bowel sounds are normal. There is no tenderness.  Neurological: She is alert.  Skin: Skin is warm, dry and intact. No rash noted.  Psychiatric: Her speech is normal and behavior is normal. Judgment and thought content normal. Her mood appears not anxious. Cognition and memory are normal. She does not exhibit a depressed mood.          Assessment & Plan:

## 2011-04-06 NOTE — Assessment & Plan Note (Signed)
Likely food poisioning with toxin given quick response. Will eval for hepatitis.

## 2011-04-06 NOTE — Patient Instructions (Addendum)
Stay off statin medication. Continue lisinopril, do not fill norvasc.  We will call with lab results.

## 2011-04-09 ENCOUNTER — Other Ambulatory Visit: Payer: Self-pay | Admitting: Family Medicine

## 2011-04-09 ENCOUNTER — Telehealth: Payer: Self-pay | Admitting: *Deleted

## 2011-04-09 DIAGNOSIS — R7989 Other specified abnormal findings of blood chemistry: Secondary | ICD-10-CM

## 2011-04-09 LAB — HEPATITIS PANEL, ACUTE
Hep A IgM: NEGATIVE
Hep B C IgM: NEGATIVE
Hepatitis B Surface Ag: NEGATIVE

## 2011-04-09 NOTE — Telephone Encounter (Signed)
See result note on patients Labs

## 2011-05-03 ENCOUNTER — Ambulatory Visit: Payer: Self-pay | Admitting: Family Medicine

## 2011-05-03 ENCOUNTER — Encounter: Payer: Self-pay | Admitting: Family Medicine

## 2011-05-28 ENCOUNTER — Telehealth: Payer: Self-pay | Admitting: Family Medicine

## 2011-05-28 NOTE — Telephone Encounter (Signed)
Triage Record Num: [id number] Operator: Tomasita Crumble Patient Name: Mallory Stewart Call Date & Time: 05/28/2011 4:28:17PM Patient Phone: [phone] PCP: Kerby Nora Patient Gender: Female PCP Fax : [phone] Patient DOB: [birth year] Practice Name: [facility] Reason for Call: Caller: Gissela/Patient; PCP: Kerby Nora ; CB#: [phone]; ; ; Call regarding Urinary Pain. Pt. calling; reports burning and frequency with urination. Onset 1/19. Urine cloudy. Denies fever. Advised see in 24 hours per Urinary Sx. protocol. Caller states she is in [place] until late evening 05/31/11. Advised home care for the interim and parameters for callback, recommended UC near her location. Caller voiced understanding of instructions. Caller asked if Rx. could be called in; advised not common practice, but that note will be sent to office. OFFICE, PLEASE FOLLOW UP WITH PATIENT REGARDING HER REQUEST; NO PHARMACY INFORMATION GIVEN. Protocol(s) Used: Urinary Symptoms - Female Recommended Outcome per Protocol: See Provider within 24 hours Reason for Outcome: Has one or more urinary tract symptoms AND has not been previously evaluated Care Advice: ~ Call provider if you develop flank or low back pain, fever, generally feel sick. Increase intake of fluids. Try to drink 8 oz. (.2 liter) every hour when awake, including unsweetened cranberry juice, unless on restricted fluids for other medical reasons. Take sips of fluid or eat ice chips if nauseated or vomiting. ~ ~ CAUTIONS Limit carbonated, alcoholic, and caffeinated beverages such as coffee, tea and soda. Avoid nonprescription cold and allergy medications that contain caffeine. Limit intake of tomatoes, fruit juices (except for unsweetened cranberry juice), dairy products, spicy foods, sugar, and artificial sweeteners (aspartame or saccharine). Stop or decrease smoking. Reducing exposure to bladder irritants may help lessen  urgency. ~ Systemic Inflammatory Response Syndrome (SIRS): Watch for signs of a generalized, whole body infection. Occurs within days of a localized infection, especially of the urinary, GI, respiratory or nervous systems; or after a traumatic injury or invasive procedure. - Call EMS 911 if symptoms have worsened, such as increasing confusion or unusual drowsiness; cold and clammy skin; no urine output; rapid respiration (>30/min.) or slow respiration (<10/min.); struggling to breathe. - Go to the ED immediately for early symptoms of rapid pulse >90/min. or rapid breathing >20/min. at rest; chills; oral temperature >100.4 F (38 C) or <96.8 F (36 C) when associated with conditions noted. ~ 01/

## 2011-05-28 NOTE — Telephone Encounter (Signed)
Left message advising patient she would need to go to Urgent care for treatment b/c we don't call in antibiotics.

## 2011-05-29 ENCOUNTER — Telehealth: Payer: Self-pay | Admitting: Family Medicine

## 2011-05-29 NOTE — Telephone Encounter (Signed)
Triage Record Num: [id number] Operator: Tomasita Crumble Patient Name: Mallory Stewart Call Date & Time: 05/28/2011 4:28:17PM Patient Phone: [phone] PCP: Kerby Nora Patient Gender: Female PCP Fax : [phone] Patient DOB: 1961/03/17 Practice Name: [facility] Reason for Call: Caller: [name]/Patient; PCP: Kerby Nora ; CB#: [phone]; ; ; Call regarding Urinary Pain. Pt. calling; reports burning and frequency with urination. Onset 1/19. Urine cloudy. Denies fever. Advised see in 24 hours per Urinary Sx. protocol. Caller states she is in [place] until late evening 05/31/11. Advised home care for the interim and parameters for callback, recommended UC near her location. Caller voiced understanding of instructions. Caller asked if Rx. could be called in; advised not common practice, but that note will be sent to office. OFFICE, PLEASE FOLLOW UP WITH PATIENT REGARDING HER REQUEST; NO PHARMACY INFORMATION GIVEN. Protocol(s) Used: Urinary Symptoms - Female Recommended Outcome per Protocol: See Provider within 24 hours Reason for Outcome: Has one or more urinary tract symptoms AND has not been previously evaluated Care Advice: ~ Call provider if you develop flank or low back pain, fever, generally feel sick. Increase intake of fluids. Try to drink 8 oz. (.2 liter) every hour when awake, including unsweetened cranberry juice, unless on restricted fluids for other medical reasons. Take sips of fluid or eat ice chips if nauseated or vomiting. ~ ~ CAUTIONS Limit carbonated, alcoholic, and caffeinated beverages such as coffee, tea and soda. Avoid nonprescription cold and allergy medications that contain caffeine. Limit intake of tomatoes, fruit juices (except for unsweetened cranberry juice), dairy products, spicy foods, sugar, and artificial sweeteners (aspartame or saccharine). Stop or decrease smoking. Reducing exposure to bladder irritants may help lessen  urgency. ~ Systemic Inflammatory Response Syndrome (SIRS): Watch for signs of a generalized, whole body infection. Occurs within days of a localized infection, especially of the urinary, GI, respiratory or nervous systems; or after a traumatic injury or invasive procedure. - Call EMS 911 if symptoms have worsened, such as increasing confusion or unusual drowsiness; cold and clammy skin; no urine output; rapid respiration (>30/min.) or slow respiration (<10/min.); struggling to breathe. - Go to the ED immediately for early symptoms of rapid pulse >90/min. or rapid breathing >20/min. at rest; chills; oral temperature >100.4 F (38 C) or <96.8 F (36 C) when associated with conditions noted. ~ 05/28/2011 4:38:26PM Page 1 of 1 [id number]

## 2011-08-01 ENCOUNTER — Other Ambulatory Visit: Payer: BC Managed Care – PPO

## 2011-08-06 ENCOUNTER — Encounter: Payer: BC Managed Care – PPO | Admitting: Family Medicine

## 2011-08-31 ENCOUNTER — Other Ambulatory Visit: Payer: Self-pay | Admitting: *Deleted

## 2011-08-31 MED ORDER — LISINOPRIL 20 MG PO TABS: 20.0000 mg | ORAL_TABLET | Freq: Every day | ORAL | Status: DC

## 2011-09-04 ENCOUNTER — Telehealth: Payer: Self-pay | Admitting: Family Medicine

## 2011-09-04 DIAGNOSIS — E119 Type 2 diabetes mellitus without complications: Secondary | ICD-10-CM

## 2011-09-04 DIAGNOSIS — I10 Essential (primary) hypertension: Secondary | ICD-10-CM

## 2011-09-04 DIAGNOSIS — R748 Abnormal levels of other serum enzymes: Secondary | ICD-10-CM

## 2011-09-04 DIAGNOSIS — E7800 Pure hypercholesterolemia, unspecified: Secondary | ICD-10-CM

## 2011-09-04 NOTE — Telephone Encounter (Signed)
Message copied by Excell Seltzer on Tue Sep 04, 2011 10:35 AM ------      Message from: Alvina Chou      Created: Thu Aug 30, 2011  3:27 PM       Patient is scheduled for CPX labs, please order future labs, Thanks , Camelia Eng

## 2011-09-05 ENCOUNTER — Other Ambulatory Visit: Payer: BC Managed Care – PPO

## 2011-09-10 ENCOUNTER — Encounter: Payer: BC Managed Care – PPO | Admitting: Family Medicine

## 2011-09-18 ENCOUNTER — Encounter: Payer: BC Managed Care – PPO | Admitting: Family Medicine

## 2011-10-09 ENCOUNTER — Other Ambulatory Visit: Payer: Self-pay

## 2011-10-11 ENCOUNTER — Other Ambulatory Visit: Payer: Self-pay

## 2011-10-16 ENCOUNTER — Encounter: Payer: Self-pay | Admitting: Family Medicine

## 2012-02-05 ENCOUNTER — Other Ambulatory Visit: Payer: Self-pay

## 2012-02-08 ENCOUNTER — Encounter: Payer: Self-pay | Admitting: Family Medicine

## 2012-02-12 ENCOUNTER — Other Ambulatory Visit: Payer: Self-pay

## 2012-02-15 ENCOUNTER — Encounter: Payer: Self-pay | Admitting: Family Medicine

## 2012-02-19 ENCOUNTER — Other Ambulatory Visit: Payer: Self-pay

## 2012-02-19 ENCOUNTER — Telehealth: Payer: Self-pay | Admitting: Family Medicine

## 2012-02-19 NOTE — Telephone Encounter (Signed)
Labs already in computer as future.

## 2012-02-19 NOTE — Telephone Encounter (Signed)
Message copied by Excell Seltzer on Tue Feb 19, 2012 12:16 AM ------      Message from: Alvina Chou      Created: Wed Feb 13, 2012  3:42 PM      Regarding: lab orders for Tuesday, 10.15.13       Patient is scheduled for CPX labs, please order future labs, Thanks , Camelia Eng

## 2012-02-20 ENCOUNTER — Other Ambulatory Visit (INDEPENDENT_AMBULATORY_CARE_PROVIDER_SITE_OTHER): Payer: 59

## 2012-02-20 DIAGNOSIS — R748 Abnormal levels of other serum enzymes: Secondary | ICD-10-CM

## 2012-02-20 DIAGNOSIS — E119 Type 2 diabetes mellitus without complications: Secondary | ICD-10-CM

## 2012-02-20 DIAGNOSIS — R7401 Elevation of levels of liver transaminase levels: Secondary | ICD-10-CM

## 2012-02-20 DIAGNOSIS — E7800 Pure hypercholesterolemia, unspecified: Secondary | ICD-10-CM

## 2012-02-20 LAB — COMPREHENSIVE METABOLIC PANEL
ALT: 120 U/L — ABNORMAL HIGH (ref 0–35)
Albumin: 3.9 g/dL (ref 3.5–5.2)
BUN: 8 mg/dL (ref 6–23)
CO2: 30 mEq/L (ref 19–32)
Calcium: 9.4 mg/dL (ref 8.4–10.5)
Chloride: 102 mEq/L (ref 96–112)
Creatinine, Ser: 1 mg/dL (ref 0.4–1.2)
GFR: 61.9 mL/min (ref >60.00)

## 2012-02-20 LAB — LIPID PANEL
HDL: 97.3 mg/dL (ref >39.00)
Triglycerides: 119 mg/dL (ref 0.0–149.0)

## 2012-02-20 LAB — LDL CHOLESTEROL, DIRECT: Direct LDL: 160.5 mg/dL

## 2012-02-22 ENCOUNTER — Encounter: Payer: Self-pay | Admitting: Family Medicine

## 2012-02-25 ENCOUNTER — Encounter: Payer: Self-pay | Admitting: *Deleted

## 2012-03-18 ENCOUNTER — Encounter: Payer: Self-pay | Admitting: Family Medicine

## 2012-03-31 ENCOUNTER — Other Ambulatory Visit: Payer: Self-pay

## 2012-03-31 MED ORDER — GLUCOSE BLOOD VI STRP: ORAL_STRIP | Status: DC

## 2012-03-31 NOTE — Telephone Encounter (Signed)
Pt request refill diabetic strips to [facility]. Pt has CPX 04/25/12.pt notified refill done.

## 2012-04-03 ENCOUNTER — Other Ambulatory Visit: Payer: Self-pay | Admitting: Family Medicine

## 2012-04-25 ENCOUNTER — Encounter: Payer: Self-pay | Admitting: Family Medicine

## 2012-05-08 ENCOUNTER — Encounter: Payer: Self-pay | Admitting: Family Medicine

## 2012-06-02 ENCOUNTER — Encounter: Payer: 59 | Admitting: Family Medicine

## 2012-06-13 ENCOUNTER — Encounter: Payer: Self-pay | Admitting: Family Medicine

## 2012-06-13 ENCOUNTER — Ambulatory Visit (INDEPENDENT_AMBULATORY_CARE_PROVIDER_SITE_OTHER): Payer: 59 | Admitting: Family Medicine

## 2012-06-13 ENCOUNTER — Other Ambulatory Visit (HOSPITAL_COMMUNITY)
Admission: RE | Admit: 2012-06-13 | Discharge: 2012-06-13 | Disposition: A | Discharge status: Home / Self Care | Payer: 59 | Source: Ambulatory Visit | Attending: Family Medicine | Admitting: Family Medicine

## 2012-06-13 VITALS — BP 140/92 | HR 100 | Temp 98.4°F | Ht 69.5 in | Wt 213.8 lb

## 2012-06-13 DIAGNOSIS — Z01419 Encounter for gynecological examination (general) (routine) without abnormal findings: Secondary | ICD-10-CM | POA: Insufficient documentation

## 2012-06-13 DIAGNOSIS — R7401 Elevation of levels of liver transaminase levels: Secondary | ICD-10-CM

## 2012-06-13 DIAGNOSIS — R748 Abnormal levels of other serum enzymes: Secondary | ICD-10-CM

## 2012-06-13 DIAGNOSIS — F411 Generalized anxiety disorder: Secondary | ICD-10-CM

## 2012-06-13 DIAGNOSIS — E7800 Pure hypercholesterolemia, unspecified: Secondary | ICD-10-CM

## 2012-06-13 DIAGNOSIS — I10 Essential (primary) hypertension: Secondary | ICD-10-CM

## 2012-06-13 DIAGNOSIS — Z0000 Encounter for general adult medical examination without abnormal findings: Secondary | ICD-10-CM

## 2012-06-13 DIAGNOSIS — Z1151 Encounter for screening for human papillomavirus (HPV): Secondary | ICD-10-CM | POA: Insufficient documentation

## 2012-06-13 DIAGNOSIS — E119 Type 2 diabetes mellitus without complications: Secondary | ICD-10-CM

## 2012-06-13 MED ORDER — VENLAFAXINE HCL ER 37.5 MG PO CP24: ORAL_CAPSULE | ORAL | Status: DC

## 2012-06-13 MED ORDER — ALPRAZOLAM 0.25 MG PO TABS: 0.2500 mg | ORAL_TABLET | Freq: Every evening | ORAL | Status: DC | PRN

## 2012-06-13 MED ORDER — GLUCOSE BLOOD VI STRP: ORAL_STRIP | Status: DC

## 2012-06-13 NOTE — Assessment & Plan Note (Signed)
previoyus poor control off statin. Hold for now, if LFTs improved  With lifestyle change consider restarting.

## 2012-06-13 NOTE — Patient Instructions (Addendum)
Return in next few weeks for labs recheck. We will call you with results. Start venlafaxine at 37.5 mg daily increase after 1 week if tolerated. Follow up in 1 month for mood re-eval. Call to schedule mammogram on your own.

## 2012-06-13 NOTE — Assessment & Plan Note (Signed)
Well controlled at home.

## 2012-06-13 NOTE — Assessment & Plan Note (Signed)
Due  For re-eval.

## 2012-06-13 NOTE — Assessment & Plan Note (Signed)
Disvcussed options. Will use venalfaxine to avoid weight gain SE. Alprazolam prn panic attacks until medication working.

## 2012-06-13 NOTE — Assessment & Plan Note (Signed)
US showed fatty liver. In 02/2012 LFTS increased further despite holding statin. She does drink some alcohol.  Recheck LFTs now. May need GI referral for further eval.

## 2012-06-13 NOTE — Progress Notes (Signed)
Subjective:    Patient ID: Mallory Stewart, female    DOB: 1961/04/23, 52 y.o.   MRN: [id number]  HPI  51 year old female presents for wellness exam.   She has been having 10-12 days of  Productive clear cough, congestion, fatigue, fever, sore throat.  Sinus pressure in last 24 hours.  Was feeling better day better overall until in last few days recurred symtpoms. No SOB, but breathing shallowly. Some body aches.  Taking airborn. Using coricidin and ibuprofen, benadryl. Cough keeping her up at night.  Anxiety, generalized: a lot of increased stress. Going through divorce. Anxiety has ben going on for years worse in last few months.  Carrying stress in upper sholders... Tightness in neck and shoulders. Having panic attacks at night.  Heart beats fast, clammy shaky. Poor sleep. No depression. No SI.   She also has the following chronic issues.  Hypertension:  Above goal on lisinopril but she has white coat HTN Using medication without problems or lightheadedness:  None Chest pain with exertion:None Edema:None Short of breath:see above Average home BPs: At 130/70 Other issues:  Elevated Cholesterol: Poor control last check.. Due for re-eval.On no med. Lab Results  Component Value Date   CHOL 266* 02/20/2012   HDL 97.30 02/20/2012   LDLCALC 63 01/30/2011   LDLDIRECT 160.5 02/20/2012   TRIG 119.0 02/20/2012   CHOLHDL 3 02/20/2012  Using medications without problems: Off med given elevated LFTs Muscle aches:  Diet compliance: great Exercise: since dec exercsiing several times a week Other complaints:  Diabetes:  Due for re-eval. On metformin. Lab Results  Component Value Date   HGBA1C 5.9 02/20/2012  Using medications without difficulties: Makes her nauseous in AMs Hypoglycemic episodes: None Hyperglycemic episodes:None Feet problems: none Blood Sugars averaging: 98-110   Liver function tests elevated.... Likely fatty liver as seen on [place]. Still increasing off  statin.   Has decreased alcohol intake. 2 drinks a day 1- 4 days a week.   Review of Systems  Constitutional: Positive for fever and fatigue.  HENT: Positive for congestion. Negative for ear pain.   Eyes: Negative for pain.  Respiratory: Negative for chest tightness and shortness of breath.   Cardiovascular: Negative for chest pain, palpitations and leg swelling.  Gastrointestinal: Negative for abdominal pain.  Genitourinary: Negative for dysuria.       Objective:   Physical Exam  Constitutional: Vital signs are normal. She appears well-developed and well-nourished. She is cooperative.  Non-toxic appearance. She does not appear ill. No distress.  HENT:  Head: Normocephalic.  Right Ear: Hearing, tympanic membrane, external ear and ear canal normal. Tympanic membrane is not erythematous, not retracted and not bulging.  Left Ear: Hearing, tympanic membrane, external ear and ear canal normal. Tympanic membrane is not erythematous, not retracted and not bulging.  Nose: Nose normal. No mucosal edema or rhinorrhea. Right sinus exhibits no maxillary sinus tenderness and no frontal sinus tenderness. Left sinus exhibits no maxillary sinus tenderness and no frontal sinus tenderness.  Mouth/Throat: Uvula is midline, oropharynx is clear and moist and mucous membranes are normal.  Eyes: Conjunctivae normal, EOM and lids are normal. Pupils are equal, round, and reactive to light. No foreign bodies found.  Neck: Trachea normal and normal range of motion. Neck supple. Carotid bruit is not present. No mass and no thyromegaly present.  Cardiovascular: Normal rate, regular rhythm, S1 normal, S2 normal, normal heart sounds, intact distal pulses and normal pulses.  Exam reveals no gallop and no friction rub.  No murmur heard. Pulmonary/Chest: Effort normal and breath sounds normal. Not tachypneic. No respiratory distress. She has no decreased breath sounds. She has no wheezes. She has no rhonchi. She has no  rales.  Abdominal: Soft. Normal appearance and bowel sounds are normal. She exhibits no distension, no fluid wave, no abdominal bruit and no mass. There is no hepatosplenomegaly. There is no tenderness. There is no rebound, no guarding and no CVA tenderness. No hernia.  Genitourinary: Vagina normal and uterus normal. No breast swelling, tenderness, discharge or bleeding. Pelvic exam was performed with patient prone. There is no rash, tenderness or lesion on the right labia. There is no rash, tenderness or lesion on the left labia. Uterus is not enlarged and not tender. Cervix exhibits no motion tenderness, no discharge and no friability. Right adnexum displays no mass, no tenderness and no fullness. Left adnexum displays no mass, no tenderness and no fullness.  Lymphadenopathy:    She has no cervical adenopathy.    She has no axillary adenopathy.  Neurological: She is alert. She has normal strength. No cranial nerve deficit or sensory deficit.  Skin: Skin is warm, dry and intact. No rash noted.  Psychiatric: Her speech is normal and behavior is normal. Judgment and thought content normal. Her mood appears not anxious. Cognition and memory are normal. She does not exhibit a depressed mood.          Assessment & Plan:  The patient's preventative maintenance and recommended screening tests for an annual wellness exam were reviewed in full today. Brought up to date unless services declined.  Counselled on the importance of diet, exercise, and its role in overall health and mortality. The patient's FH and SH was reviewed, including their home life, tobacco status, and drug and alcohol status.   Vaccines: uptodate with Td, refused fliu Mammo: Due . PAP/DVE: last Pap nml at GYN 2012, due for re-eval , q 2 year schedule.  No menses at this point. Colon:No early family history. Nonsmoker

## 2012-06-23 ENCOUNTER — Encounter: Payer: Self-pay | Admitting: *Deleted

## 2012-07-01 ENCOUNTER — Other Ambulatory Visit: Payer: 59

## 2012-07-24 ENCOUNTER — Other Ambulatory Visit: Payer: 59

## 2012-07-29 ENCOUNTER — Ambulatory Visit: Payer: 59 | Admitting: Family Medicine

## 2012-08-23 ENCOUNTER — Other Ambulatory Visit: Payer: Self-pay | Admitting: Family Medicine

## 2012-09-15 ENCOUNTER — Other Ambulatory Visit: Payer: 59

## 2012-09-19 ENCOUNTER — Ambulatory Visit: Payer: 59 | Admitting: Family Medicine

## 2012-09-26 ENCOUNTER — Ambulatory Visit: Payer: 59 | Admitting: Family Medicine

## 2012-10-22 ENCOUNTER — Encounter: Payer: Self-pay | Admitting: *Deleted

## 2012-10-23 ENCOUNTER — Other Ambulatory Visit: Payer: 59

## 2012-10-30 ENCOUNTER — Encounter: Payer: Self-pay | Admitting: Radiology

## 2012-10-31 ENCOUNTER — Ambulatory Visit: Payer: 59 | Admitting: Family Medicine

## 2012-11-18 ENCOUNTER — Encounter: Payer: Self-pay | Admitting: Radiology

## 2012-11-19 ENCOUNTER — Other Ambulatory Visit: Payer: 59

## 2012-11-20 ENCOUNTER — Encounter: Payer: Self-pay | Admitting: Radiology

## 2012-11-21 ENCOUNTER — Ambulatory Visit: Payer: 59 | Admitting: Family Medicine

## 2012-11-21 DIAGNOSIS — Z0289 Encounter for other administrative examinations: Secondary | ICD-10-CM

## 2012-12-31 ENCOUNTER — Other Ambulatory Visit: Payer: Self-pay | Admitting: *Deleted

## 2012-12-31 MED ORDER — LISINOPRIL 20 MG PO TABS: ORAL_TABLET | ORAL | Status: DC

## 2012-12-31 MED ORDER — GLUCOSE BLOOD VI STRP: ORAL_STRIP | Status: DC

## 2012-12-31 MED ORDER — METFORMIN HCL ER 500 MG PO TB24: ORAL_TABLET | ORAL | Status: DC

## 2012-12-31 NOTE — Telephone Encounter (Signed)
Last office visit 06/2012. Is it okay to refill?

## 2013-04-07 LAB — COMPREHENSIVE METABOLIC PANEL
Alkaline Phosphatase: 75 U/L
Bilirubin,Total: 0.5 mg/dL (ref 0.2–1.0)
Calcium, Total: 9 mg/dL (ref 8.5–10.1)
Chloride: 101 mmol/L (ref 98–107)
Creatinine: 1.05 mg/dL (ref 0.60–1.30)
EGFR (African American): >60
EGFR (Non-African Amer.): >60
SGOT(AST): 204 U/L — ABNORMAL HIGH (ref 15–37)
SGPT (ALT): 184 U/L — ABNORMAL HIGH (ref 12–78)
Total Protein: 6.9 g/dL (ref 6.4–8.2)

## 2013-04-07 LAB — URINALYSIS, COMPLETE
Bacteria: NONE SEEN
Bilirubin,UR: NEGATIVE
Glucose,UR: 150 mg/dL (ref 0–75)
Granular Cast: 15
Hyaline Cast: 10
Ph: 5 (ref 4.5–8.0)
Specific Gravity: 1.02 (ref 1.003–1.030)
Squamous Epithelial: 11
WBC UR: 3 /HPF (ref 0–5)

## 2013-04-07 LAB — CBC WITH DIFFERENTIAL/PLATELET
Eosinophil %: 0.2 %
HCT: 37 % (ref 35.0–47.0)
HGB: 12.4 g/dL (ref 12.0–16.0)
Lymphocyte #: 0.6 10*3/uL — ABNORMAL LOW (ref 1.0–3.6)
MCHC: 33.4 g/dL (ref 32.0–36.0)
MCV: 96 fL (ref 80–100)
Monocyte #: 0.3 x10 3/mm (ref 0.2–0.9)
Monocyte %: 3.8 %
Neutrophil %: 87.1 %
Platelet: 152 10*3/uL (ref 150–440)
RDW: 12.8 % (ref 11.5–14.5)
WBC: 7.5 10*3/uL (ref 3.6–11.0)

## 2013-04-07 LAB — LIPASE, BLOOD: Lipase: >10000 U/L — ABNORMAL HIGH (ref 73–393)

## 2013-04-07 LAB — TROPONIN I: Troponin-I: <0.02 ng/mL

## 2013-04-08 ENCOUNTER — Inpatient Hospital Stay: Payer: Self-pay | Admitting: Internal Medicine

## 2013-04-08 DIAGNOSIS — K859 Acute pancreatitis without necrosis or infection, unspecified: Secondary | ICD-10-CM | POA: Insufficient documentation

## 2013-04-08 LAB — COMPREHENSIVE METABOLIC PANEL
Albumin: 2.8 g/dL — ABNORMAL LOW (ref 3.4–5.0)
Alkaline Phosphatase: 54 U/L
Anion Gap: 10 (ref 7–16)
BUN: 10 mg/dL (ref 7–18)
Bilirubin,Total: 0.8 mg/dL (ref 0.2–1.0)
Calcium, Total: 7.4 mg/dL — ABNORMAL LOW (ref 8.5–10.1)
Co2: 22 mmol/L (ref 21–32)
Sodium: 138 mmol/L (ref 136–145)

## 2013-04-08 LAB — LIPID PANEL
Cholesterol: 217 mg/dL — ABNORMAL HIGH (ref 0–200)
HDL Cholesterol: 75 mg/dL — ABNORMAL HIGH (ref 40–60)
VLDL Cholesterol, Calc: 24 mg/dL (ref 5–40)

## 2013-04-09 LAB — COMPREHENSIVE METABOLIC PANEL
Albumin: 2.5 g/dL — ABNORMAL LOW (ref 3.4–5.0)
BUN: 10 mg/dL (ref 7–18)
Bilirubin,Total: 1 mg/dL (ref 0.2–1.0)
Calcium, Total: 6.7 mg/dL — CL (ref 8.5–10.1)
Creatinine: 1.13 mg/dL (ref 0.60–1.30)
EGFR (African American): >60
Potassium: 4.1 mmol/L (ref 3.5–5.1)
Sodium: 136 mmol/L (ref 136–145)
Total Protein: 5.3 g/dL — ABNORMAL LOW (ref 6.4–8.2)

## 2013-04-09 LAB — LIPID PANEL
Cholesterol: 153 mg/dL (ref 0–200)
HDL Cholesterol: 44 mg/dL (ref 40–60)
Ldl Cholesterol, Calc: 83 mg/dL (ref 0–100)
Triglycerides: 130 mg/dL (ref 0–200)
VLDL Cholesterol, Calc: 26 mg/dL (ref 5–40)

## 2013-04-09 LAB — CBC WITH DIFFERENTIAL/PLATELET
Basophil %: 0.2 %
Eosinophil %: 0.3 %
HCT: 34.1 % — ABNORMAL LOW (ref 35.0–47.0)
Lymphocyte #: 0.9 10*3/uL — ABNORMAL LOW (ref 1.0–3.6)
MCH: 32.6 pg (ref 26.0–34.0)
MCHC: 34.2 g/dL (ref 32.0–36.0)
MCV: 96 fL (ref 80–100)
Neutrophil #: 6.5 10*3/uL (ref 1.4–6.5)
Neutrophil %: 81.2 %
Platelet: 114 10*3/uL — ABNORMAL LOW (ref 150–440)
RBC: 3.57 10*6/uL — ABNORMAL LOW (ref 3.80–5.20)
RDW: 13 % (ref 11.5–14.5)

## 2013-04-09 LAB — TSH: Thyroid Stimulating Horm: 3.81 u[IU]/mL

## 2013-04-21 ENCOUNTER — Telehealth: Payer: Self-pay

## 2013-04-21 NOTE — Telephone Encounter (Signed)
Pt was seen in [facility] 04/07/13 thru 04/10/13 with acute pancreatitis; while in hospital pt developed numbness in upper and lower abdomen; pt said the fullness in her abdomen is almost gone; no N&V, no fever or abdominal pain and appetite is better. Numbness is still there and pt has f/u appt with Dr Ermalene Searing 05/08/13 and pt wants to know if OK to wait until appt to address numbness.Please advise.

## 2013-04-21 NOTE — Telephone Encounter (Signed)
I believe it should be alrigth to wait until then to address the numbness since other symptoms are better. If N/V/ pain fever... Call sooner or return to ER.

## 2013-04-21 NOTE — Telephone Encounter (Signed)
Patient notified as instructed by telephone.  Mallory Stewart states the numbness started after receiving Lovenox injections in the hospital.  Advised hopefully the numbness will probably gradually go away on its own.   Will request records for [facility].

## 2013-04-27 ENCOUNTER — Encounter: Payer: Self-pay | Admitting: Family Medicine

## 2013-04-27 ENCOUNTER — Ambulatory Visit (INDEPENDENT_AMBULATORY_CARE_PROVIDER_SITE_OTHER): Payer: 59 | Admitting: Family Medicine

## 2013-04-27 VITALS — BP 170/110 | HR 108 | Temp 98.3°F | Ht 69.5 in | Wt 196.8 lb

## 2013-04-27 DIAGNOSIS — K7689 Other specified diseases of liver: Secondary | ICD-10-CM

## 2013-04-27 DIAGNOSIS — R1013 Epigastric pain: Secondary | ICD-10-CM

## 2013-04-27 DIAGNOSIS — R748 Abnormal levels of other serum enzymes: Secondary | ICD-10-CM

## 2013-04-27 DIAGNOSIS — R200 Anesthesia of skin: Secondary | ICD-10-CM

## 2013-04-27 DIAGNOSIS — K859 Acute pancreatitis without necrosis or infection, unspecified: Secondary | ICD-10-CM

## 2013-04-27 DIAGNOSIS — R209 Unspecified disturbances of skin sensation: Secondary | ICD-10-CM

## 2013-04-27 DIAGNOSIS — K760 Fatty (change of) liver, not elsewhere classified: Secondary | ICD-10-CM | POA: Insufficient documentation

## 2013-04-27 MED ORDER — HYDROCODONE-ACETAMINOPHEN 5-325 MG PO TABS: 1.0000 | ORAL_TABLET | Freq: Four times a day (QID) | ORAL | Status: DC | PRN

## 2013-04-27 NOTE — Progress Notes (Signed)
Date:  04/27/2013   Name:  Mallory Stewart   DOB:  02/15/1961   MRN:  [id number] Gender: female Age: 52 y.o.  Primary Physician:  Kerby Nora, MD   Chief Complaint: Numbness from Abdomen to legs   Subjective:   History of Present Illness:  Mallory Stewart is a 52 y.o. pleasant patient who presents with the following:  This patient was worked in emergently to evaluate for ongoing abdominal numbness, and abdominal discomfort and pain.  She was admitted on 04/07/2013 at [facility], and she was discharged on December 5 due to pancreatitis. The patient's lipase was elevated at up to a point of 10,000. The lowest lipase that I can find in the hospital records 3000. She had an abdominal ultrasound while she was in the hospital. Her last bilirubin was normal. She does have LFTs are elevated in the 100 range, and she did have evidence for fatty liver on her ultrasound.  On her discharge paperwork and per the patient's report to me, her pancreatitis was ascribed to alcoholic pancreatitis. The patient reports to me several times that she drinks approximately one to 2 drinks 2 or 3 times weekly.  Numbness, started over her epigastric region and now is distended in the abdomen. Now maybe has a little bit of sensation. Over the weekend it has spread but it has spread only to the ankles. The greatest point of decreased sensation is in her abdomen, but now she also has significant decreased sensation medially on her legs in the upper legs to the knees only medially. This is bilaterally.  She denies bowel or bladder incontinence, and she is not having any significant back pain. Only medial, adjacent to the knees and up.  Does not hurt when flat.   Vicodin 5/325. bid   Patient Active Problem List   Diagnosis Date Noted  . Fatty liver 04/27/2013  . Elevated liver enzymes 02/05/2011  . Anxiety, generalized 09/23/2009  . EXCESSIVE OR FREQUENT MENSTRUATION 05/10/2009  . DIABETES MELLITUS, TYPE II  11/03/2008  . HYPERCHOLESTEROLEMIA 11/03/2008  . HYPERTENSION 09/29/2008  . PAP SMEAR, ABNORMAL 09/29/2008  . HEAT INTOLERANCE 09/29/2008    Past Medical History  Diagnosis Date  . Hypertension     Past Surgical History  Procedure Laterality Date  . Tonsillectomy    . Leep  1990's  . Orif foot fracture  09/2008    L 5th metatarsal    History   Social History  . Marital Status: Married    Spouse Name: N/A    Number of Children: N/A  . Years of Education: N/A   Occupational History  . Director of business development    Social History Main Topics  . Smoking status: Never Smoker   . Smokeless tobacco: Never Used  . Alcohol Use: 4.2 oz/week    7 Glasses of wine per week     Comment: wine  . Drug Use: No  . Sexual Activity: Not on file   Other Topics Concern  . Not on file   Social History Narrative   Regular exercise , walking 4-5 times a week   Diet fruits and veggies, water    Family History  Problem Relation Age of Onset  . Stroke Father     after hernia suegery  . Prostate cancer Father   . Other Father     global transient amnesia, unclear source  . Other Mother     tachycardia.Marland KitchenMarland Kitchen?afib  . Atrial fibrillation Sister   . Healthy Brother   .  Healthy Brother   . Coronary artery disease Paternal Grandmother   . Heart attack Paternal Grandmother 40  . Brain cancer Maternal Grandfather     ?  Marland Kitchen Cancer Paternal Grandfather     ?  Marland Kitchen Breast cancer Maternal Grandmother     Allergies  Allergen Reactions  . Simvastatin     Elevated lfts?    Medication list has been reviewed and updated.  Review of Systems:   GEN: as above GI: nausea, abdominal pain Pulm: No SOB Interactive and getting along well at home. Numbness without back pain Otherwise, the pertinent positives and negatives are listed above and in the HPI, otherwise a full review of systems has been reviewed and is negative unless noted positive.   Objective:   Physical Examination: BP  170/110  Pulse 108  Temp(Src) 98.3 F (36.8 C) (Oral)  Ht 5' 9.5" (1.765 m)  Wt 196 lb 12 oz (89.245 kg)  BMI 28.65 kg/m2  Ideal Body Weight: Weight in (lb) to have BMI = 25: 171.4   GEN: WDWN, NAD, Non-toxic, A & O x 3 HEENT: Atraumatic, Normocephalic. Neck supple. No masses, No LAD. Ears and Nose: No external deformity. CV: RRR, No M/G/R. No JVD. No thrill. No extra heart sounds. PULM: CTA B, no wheezes, crackles, rhonchi. No retractions. No resp. distress. No accessory muscle use. Abd: Mild tenderness diffusely more to moderate in the epigastric region. There is no rebound tenderness. No distention. Positive bowel sounds. No hepatosplenomegaly. EXTR: No c/c/e NEURO Normal gait. There is decreased sensation throughout the abdominal wall anteriorly. Sensory exam in the lower extremity shows decreased sensation to pinprick and gross touch medially on the right and left upper thigh to the point around the knee. Deep tendon reflexes are intact and 2+ bilaterally at the knee and at the ankle. Strength is preserved at the hip as well as the knee, and the ankle. PSYCH: Normally interactive. Conversant. Not depressed or anxious appearing.  Calm demeanor.   Laboratory and Imaging Data:  [facility] data and discharge reviewed in full.  Assessment & Plan:    Numbness - Plan: CBC with Differential, Hepatic function panel, Lipase, CT Abdomen Pelvis W Contrast  Pancreatitis, acute - Plan: CBC with Differential, Hepatic function panel, Lipase, CT Abdomen Pelvis W Contrast  Abdominal pain, epigastric - Plan: CBC with Differential, Hepatic function panel, Lipase, CT Abdomen Pelvis W Contrast  Elevated liver enzymes  Fatty liver  Medical decision making and data analysis in this case is high level of complexity.  High level of concern for markedly abnormal neurological exam in a 52 year old patient recently admitted with a lipase of 10,000. Ultrasound was completed in prior hospitalization. Other  causes for elevated lipase and pancreatitis would need to be entertained here. Given altered numbness and progressive, further workup indicated. Initial workup, obtain a CT of the abdomen and pelvis with contrast to evaluate for potential neoplastic disease. Cannot exclude any occult intra-abdominal pathology or neoplasm. Evaluate for potential pancreatic neoplasm. After CT of the abdomen and pelvis is complete, further evaluation to fully delineate source of altered neurologic examination needed until definitive diagnosis obtained.  If the patient decompensates further with additional motor or neurological dysfunction, hospitalization appropriate.  The patient has scheduled followup with her primary care provider, my partner next week.  Patient Instructions  REFERRAL: GO THE THE FRONT ROOM AT THE ENTRANCE OF OUR CLINIC, NEAR CHECK IN. ASK FOR MARION. SHE WILL HELP YOU SET UP YOUR REFERRAL. DATE: TIME:  Orders Today:  Orders Placed This Encounter  Procedures  . CT Abdomen Pelvis W Contrast  . CBC with Differential  . Hepatic function panel  . Lipase    New medications, updates to list, dose adjustments: Meds ordered this encounter  Medications  . HYDROcodone-acetaminophen (NORCO/VICODIN) 5-325 MG per tablet    Sig: Take 1 tablet by mouth every 6 (six) hours as needed for moderate pain.    Dispense:  40 tablet    Refill:  0    Signed,  Renny Gunnarson T. Inice Sanluis, MD, CAQ Sports Medicine  [facility] [address] [place] [address] Phone: [phone] Fax: [phone]  Updated Complete Medication List:   Medication List       This list is accurate as of: 04/27/13  6:51 PM.  Always use your most recent med list.               glucose blood test strip  Onetouch ultra test strips Use as instructed to check blood sugars twice a day.  Dx: 250.00     HYDROcodone-acetaminophen 5-325 MG per tablet  Commonly known as:  NORCO/VICODIN  Take 1 tablet  by mouth every 6 (six) hours as needed for moderate pain.     lisinopril 20 MG tablet  Commonly known as:  PRINIVIL,ZESTRIL  TAKE 1 TABLET (20 MG TOTAL) BY MOUTH DAILY.     metFORMIN 500 MG 24 hr tablet  Commonly known as:  GLUCOPHAGE-XR  TAKE 2 TABLETS BY MOUTH IN THE MORNING AND 1 TABLET BY MOUTH AT BEDTIME     onetouch ultrasoft lancets  Use as instructed to check blood sugars twice a day

## 2013-04-27 NOTE — Patient Instructions (Signed)
REFERRAL: GO THE THE FRONT ROOM AT THE ENTRANCE OF OUR CLINIC, NEAR CHECK IN. ASK FOR MARION. SHE WILL HELP YOU SET UP YOUR REFERRAL. DATE: TIME:  

## 2013-04-27 NOTE — Progress Notes (Signed)
Pre-visit discussion using our clinic review tool. No additional management support is needed unless otherwise documented below in the visit note.  

## 2013-04-28 LAB — CBC WITH DIFFERENTIAL/PLATELET
Basophils Relative: 0.2 % (ref 0.0–3.0)
Eosinophils Absolute: 0.4 10*3/uL (ref 0.0–0.7)
Eosinophils Relative: 3.7 % (ref 0.0–5.0)
HCT: 33.6 % — ABNORMAL LOW (ref 36.0–46.0)
Lymphs Abs: 1.5 10*3/uL (ref 0.7–4.0)
MCHC: 32.8 g/dL (ref 30.0–36.0)
MCV: 93.4 fl (ref 78.0–100.0)
Monocytes Absolute: 0.4 10*3/uL (ref 0.1–1.0)
Neutro Abs: 9.2 10*3/uL — ABNORMAL HIGH (ref 1.4–7.7)
RBC: 3.6 Mil/uL — ABNORMAL LOW (ref 3.87–5.11)
WBC: 11.5 10*3/uL — ABNORMAL HIGH (ref 4.5–10.5)

## 2013-04-28 LAB — HEPATIC FUNCTION PANEL
ALT: 26 U/L (ref 0–35)
Albumin: 3.6 g/dL (ref 3.5–5.2)
Bilirubin, Direct: 0.2 mg/dL (ref 0.0–0.3)
Total Protein: 7.1 g/dL (ref 6.0–8.3)

## 2013-04-28 LAB — LIPASE: Lipase: 113 U/L — ABNORMAL HIGH (ref 11.0–59.0)

## 2013-05-01 ENCOUNTER — Ambulatory Visit (INDEPENDENT_AMBULATORY_CARE_PROVIDER_SITE_OTHER)
Admission: RE | Admit: 2013-05-01 | Discharge: 2013-05-01 | Disposition: A | Discharge status: Home / Self Care | Payer: 59 | Source: Ambulatory Visit | Attending: Family Medicine | Admitting: Family Medicine

## 2013-05-01 DIAGNOSIS — K859 Acute pancreatitis without necrosis or infection, unspecified: Secondary | ICD-10-CM

## 2013-05-01 DIAGNOSIS — R1013 Epigastric pain: Secondary | ICD-10-CM

## 2013-05-01 DIAGNOSIS — R209 Unspecified disturbances of skin sensation: Secondary | ICD-10-CM

## 2013-05-01 DIAGNOSIS — R200 Anesthesia of skin: Secondary | ICD-10-CM

## 2013-05-01 MED ORDER — IOHEXOL 300 MG/ML  SOLN
100.0000 mL | Freq: Once | INTRAMUSCULAR | Status: AC | PRN
  Administered 2013-05-01: 100 mL via INTRAVENOUS

## 2013-05-06 ENCOUNTER — Ambulatory Visit (HOSPITAL_COMMUNITY)
Admission: RE | Admit: 2013-05-06 | Discharge: 2013-05-06 | Disposition: A | Discharge status: Home / Self Care | Payer: 59 | Source: Ambulatory Visit | Attending: Family Medicine | Admitting: Family Medicine

## 2013-05-06 ENCOUNTER — Encounter: Payer: Self-pay | Admitting: Family Medicine

## 2013-05-06 ENCOUNTER — Ambulatory Visit (INDEPENDENT_AMBULATORY_CARE_PROVIDER_SITE_OTHER): Payer: 59 | Admitting: Family Medicine

## 2013-05-06 VITALS — BP 154/104 | HR 96 | Temp 98.8°F | Wt 194.5 lb

## 2013-05-06 DIAGNOSIS — M5137 Other intervertebral disc degeneration, lumbosacral region: Secondary | ICD-10-CM | POA: Insufficient documentation

## 2013-05-06 DIAGNOSIS — R209 Unspecified disturbances of skin sensation: Secondary | ICD-10-CM

## 2013-05-06 DIAGNOSIS — K8689 Other specified diseases of pancreas: Secondary | ICD-10-CM | POA: Insufficient documentation

## 2013-05-06 DIAGNOSIS — R200 Anesthesia of skin: Secondary | ICD-10-CM | POA: Insufficient documentation

## 2013-05-06 DIAGNOSIS — M51379 Other intervertebral disc degeneration, lumbosacral region without mention of lumbar back pain or lower extremity pain: Secondary | ICD-10-CM | POA: Insufficient documentation

## 2013-05-06 DIAGNOSIS — K862 Cyst of pancreas: Secondary | ICD-10-CM

## 2013-05-06 DIAGNOSIS — K859 Acute pancreatitis without necrosis or infection, unspecified: Secondary | ICD-10-CM | POA: Insufficient documentation

## 2013-05-06 HISTORY — DX: Cyst of pancreas: K86.2

## 2013-05-06 NOTE — Progress Notes (Signed)
Pre-visit discussion using our clinic review tool. No additional management support is needed unless otherwise documented below in the visit note.  

## 2013-05-06 NOTE — Progress Notes (Signed)
Subjective:    Patient ID: Mallory Stewart, female    DOB: Jan 20, 1961, 52 y.o.   MRN: [id number]  HPI 52 year old female presents for hospital follow up.  She was seen on 12/22by Dr. Patsy Lager as well. She was admitted on 04/07/2013 at [facility], and she was discharged on 12/5 due to pancreatitis. The patient's lipase was elevated at up to a point of 10,000. The lowest lipase  in the hospital records 3000. She had an abdominal ultrasound while she was in the hospital. Her last bilirubin was normal. She does have LFTs are elevated in the 100 range, and she did have evidence for fatty liver on her ultrasound.   On her discharge paperwork and per the patient's report, her pancreatitis was ascribed to alcoholic pancreatitis. The patient reports to me several times that she drinks approximately one to 2 drinks 2 or 3 times weekly.   She saw Dr. Patsy Lager emergently on 12/22 for the following: [Numbness, started over her epigastric region and now is distended in the abdomen. Now maybe has a little bit of sensation. Over the weekend it has spread but it has spread only to the ankles. The greatest point of decreased sensation is in her abdomen, but now she also has significant decreased sensation medially on her legs in the upper legs to the knees only medially. This is bilaterally.  She denies bowel or bladder incontinence, and she is not having any significant back pain.  Only medial, adjacent to the knees and up.  Does not hurt when flat.  Vicodin 5/325. Bid]  He recommended labs and imaging.  Her lipase decreased down to 100, LFTs significantly decreased, wbc  11.5  CT/Abd/pelvis: IMPRESSION:  Peripancreatic inflammatory stranding, compatible with recently  diagnosed pancreatitis.  2.1 cm fluid collection the junction of the pancreatic head and  body, suggesting an early/developing pseudocyst. No drainable fluid  collection/abscess.  No pancreatic mass is seen.  Today she reports  she  numbness  from under breasts to ankles both sides. Now has progressed some to back of legs and arms as well. Perineal area is numb.  She first noted symptoms as she left the hospital. No vaccines given.  No back pain, mild neck soreness off and on since hospitalization.  Still has sensation on lateral legs.  Her abdominal pain is mild discomfort/ heaviness in epigastrium. Resolves with lying down.   Her BP is elevated as at last OV. She is upset today as cat died this morning. She has been okay at home 130/70s.  BP Readings from Last 3 Encounters:  05/06/13 154/104  04/27/13 170/110  06/13/12 140/92     No family history of MS.  no personal history of numbness in unusual places.  No recent vaccines given.  Review of Systems  Constitutional: Negative for fever and fatigue.  HENT: Negative for ear pain.   Eyes: Negative for pain.  Respiratory: Negative for chest tightness and shortness of breath.   Cardiovascular: Negative for chest pain, palpitations and leg swelling.  Gastrointestinal: Negative for abdominal pain.  Genitourinary: Negative for dysuria.       Objective:   Physical Exam  Constitutional: She is oriented to person, place, and time. Vital signs are normal. She appears well-developed and well-nourished. She is cooperative.  Non-toxic appearance. She does not appear ill. No distress.  HENT:  Head: Normocephalic.  Right Ear: Hearing, tympanic membrane, external ear and ear canal normal. Tympanic membrane is not erythematous, not retracted and not bulging.  Left Ear: Hearing, tympanic membrane, external ear and ear canal normal. Tympanic membrane is not erythematous, not retracted and not bulging.  Nose: No mucosal edema or rhinorrhea. Right sinus exhibits no maxillary sinus tenderness and no frontal sinus tenderness. Left sinus exhibits no maxillary sinus tenderness and no frontal sinus tenderness.  Mouth/Throat: Uvula is midline, oropharynx is clear and moist and mucous  membranes are normal.  Eyes: Conjunctivae, EOM and lids are normal. Pupils are equal, round, and reactive to light. Lids are everted and swept, no foreign bodies found.  Neck: Trachea normal and normal range of motion. Neck supple. Carotid bruit is not present. No mass and no thyromegaly present.  Cardiovascular: Normal rate, regular rhythm, S1 normal, S2 normal, normal heart sounds, intact distal pulses and normal pulses.  Exam reveals no gallop and no friction rub.   No murmur heard. Pulmonary/Chest: Effort normal and breath sounds normal. Not tachypneic. No respiratory distress. She has no decreased breath sounds. She has no wheezes. She has no rhonchi. She has no rales.  Abdominal: Soft. Normal appearance and bowel sounds are normal. There is no tenderness.  Musculoskeletal:       Cervical back: She exhibits normal range of motion, no tenderness and no bony tenderness.       Thoracic back: Normal. She exhibits normal range of motion and no tenderness.       Lumbar back: Normal. She exhibits normal range of motion and no tenderness.  Neurological: She is alert and oriented to person, place, and time. She has normal strength and normal reflexes. She displays no atrophy and no tremor. A sensory deficit is present. No cranial nerve deficit. She exhibits normal muscle tone. She displays a negative Romberg sign. Coordination and gait normal. GCS eye subscore is 4. GCS verbal subscore is 5. GCS motor subscore is 6.  Nml cerebellar exam  decreased sensation breast down (except feet and lateral legs) , can feel cold, no sensation around umbilicus No papilledema  Skin: Skin is warm, dry and intact. No rash noted.  Psychiatric: She has a normal mood and affect. Her speech is normal and behavior is normal. Judgment and thought content normal. Her mood appears not anxious. Cognition and memory are normal. Cognition and memory are not impaired. She does not exhibit a depressed mood. She exhibits normal recent  memory and normal remote memory.          Assessment & Plan:   Acute pancreatitis: unclear source. Pt does not use much alcohol. No gallstones seen.  Repeat CT abd/pelvis showed pseudocyst small.  Consider GI referral for further consideration of etiology .  Numbness breast to feet. Discussed in deatil with Dr. Arbutus Leas neurologist. Will eval with MRi throacic spine STAT ( may need cervical as well at later time).  Will hold on labs such as B12, TSH until appt with neuro.  Refer to neuro within next week.

## 2013-05-06 NOTE — Patient Instructions (Signed)
Stop at front desk to arrange MRI thoracic spine and referral to [facility] ASAP.

## 2013-05-08 ENCOUNTER — Ambulatory Visit (INDEPENDENT_AMBULATORY_CARE_PROVIDER_SITE_OTHER): Payer: 59 | Admitting: Neurology

## 2013-05-08 ENCOUNTER — Encounter: Payer: Self-pay | Admitting: Neurology

## 2013-05-08 ENCOUNTER — Ambulatory Visit: Payer: 59 | Admitting: Family Medicine

## 2013-05-08 VITALS — BP 152/94 | HR 100 | Resp 14 | Ht 69.5 in | Wt 194.2 lb

## 2013-05-08 DIAGNOSIS — R202 Paresthesia of skin: Principal | ICD-10-CM

## 2013-05-08 DIAGNOSIS — R200 Anesthesia of skin: Secondary | ICD-10-CM

## 2013-05-08 DIAGNOSIS — R209 Unspecified disturbances of skin sensation: Secondary | ICD-10-CM

## 2013-05-08 LAB — BASIC METABOLIC PANEL
BUN: 5 mg/dL — ABNORMAL LOW (ref 6–23)
CO2: 23 mEq/L (ref 19–32)
Calcium: 8.9 mg/dL (ref 8.4–10.5)
Chloride: 103 mEq/L (ref 96–112)
Creatinine, Ser: 0.8 mg/dL (ref 0.4–1.2)
GFR: 86.83 mL/min (ref >60.00)
Glucose, Bld: 265 mg/dL — ABNORMAL HIGH (ref 70–99)
POTASSIUM: 3.7 meq/L (ref 3.5–5.1)
SODIUM: 138 meq/L (ref 135–145)

## 2013-05-08 LAB — VITAMIN B12: Vitamin B-12: 162 pg/mL — ABNORMAL LOW (ref 211–911)

## 2013-05-08 LAB — RPR

## 2013-05-08 NOTE — Patient Instructions (Addendum)
I'm really not sure what is going on.  Based on the pattern of the numbness, it should involve the spinal cord.  It is an unusual presentation for a peripheral nerve problem. 1.  We will first check MRI of brain and spinal cord in neck.  We will check some bloodwork as well. 2.  If unremarkable, we will check the peripheral nerves by scheduling a nerve conduction study/EMG. 3.  Follow up soon after.  [facility] MRI [facility] at [facility] located at [address] (beside the hospital) on Monday, January 12th at noon.  Please arrive 15 minutes prior to your appointment.    [phone].

## 2013-05-08 NOTE — Progress Notes (Signed)
NEUROLOGY CONSULTATION NOTE  Mallory PickerelKathleen Josten MRN: [id number] DOB: [birth year]  Referring provider: Dr. Ermalene SearingBedsole Primary care provider: Dr. Ermalene SearingBedsole  Reason for consult:  Progressive numbness.  HISTORY OF PRESENT ILLNESS: Mallory Stewart is a 53 year old right-handed woman with history of recent pancreatitis, alcohol abuse, transaminitis, type II diabetes mellitus, hypercholesterolemia, hypertension who presents for numbness.  Records and images were personally reviewed where available.    She was [facility] to 04/10/13 for sudden onset epigastric abdominal pain associated with nausea and vomiting.  She was found to have a lipase greater than 10,000.  She was found to have elevated liver enzymes, which is pre-existing and thought to possibly have been secondary to statin therapy, which she has stopped.  Abdominal ultrasound revealed fatty liver.  She was diagnosed with pancreatitis, presumably secondary to history of alcoholism, however she disputes this.  She reports drinking approximately 1 to 2 glasses of wine about 2 or 3 times a week and has no history of excessive alcohol consumption.  While in the hospital, she began noticing numbness involving the anterior torso from the level of the mid-sternum to the waistline.  Over the past 2 to 3 weeks, the numbness spread down the anterior legs, including the thighs, knees and shins, down to the ankles.  It also spread to the perineal region. Over the past few days, the numbness involves the back of the legs as well, and now she notices numbness in her upper underarms and side of her thorax bilaterally.  She possibly thinks it may involve to dorsal forearms, but she is not sure if she is just being hypersensitive.  Symptoms are symmetric. There is no tingling, except for the shins.  There is no associated neuropathic pain.  She notes occasional neck pain radiating down both sides of neck.  She still has mild abdominal pain  and epigastric discomfort, that resolves when lying down.  She denies back pain, bowel or bladder incontinence or retention, abnormal sweating, palpitations, visual symptoms, or headache.  She notes mild shortness of breath, which she was told is due to the pancreatitis.  She denies any extremity weakness, although sometimes she has a feeling that her legs will give out while walking.  There is no ataxia or falls.  Besides the pancreatitis, she has had no illness or flu-like symptoms.  She has never had similar episode before.  CT Abdomen & Pelvis with contrast (05/04/13): "peripancreatic inflammatory stranding, combatible with recently diagnosed pancreatitis.  2.1 cm fluid collection the junction of the pancreatic head and body, suggesting an early/developing pseudocyst.  No drainable fluid collection/abscess.  No pancreatic mass is seen.  MRI Thoracic Spine wo Contrast (05/06/13):  Unremarkable.  No abnormality involving the thoracic cord and conus medullaris.  No stenosis.  Labs (04/27/13): WBC 11.5, Hgb 11, HCT 33.6, PLT 392, MCV 93.4, Neut 79.9%, Lymphis 12.7%, Mono 3.5%, Eos 3.7%, TB 0.7, DB 0.2, AP 76, AST 54, ALT 26, TP 7.1, ALB 3.6, Lipase 11.  PAST MEDICAL HISTORY: Past Medical History  Diagnosis Date  . Hypertension   . Diabetes     PAST SURGICAL HISTORY: Past Surgical History  Procedure Laterality Date  . Tonsillectomy    . Leep  1990's  . Orif foot fracture  09/2008    L 5th metatarsal  . Refractive surgery  2000    MEDICATIONS: Current Outpatient Prescriptions on File Prior to Visit  Medication Sig Dispense Refill  . glucose blood test strip Onetouch  ultra test strips Use as instructed to check blood sugars twice a day.  Dx: 250.00  100 each  11  . HYDROcodone-acetaminophen (NORCO/VICODIN) 5-325 MG per tablet Take 1 tablet by mouth every 6 (six) hours as needed for moderate pain.  40 tablet  0  . Lancets (ONETOUCH ULTRASOFT) lancets Use as instructed to check blood sugars  twice a day       . lisinopril (PRINIVIL,ZESTRIL) 20 MG tablet TAKE 1 TABLET (20 MG TOTAL) BY MOUTH DAILY.  90 tablet  3  . metFORMIN (GLUCOPHAGE-XR) 500 MG 24 hr tablet TAKE 2 TABLETS BY MOUTH IN THE MORNING AND 1 TABLET BY MOUTH AT BEDTIME  270 tablet  3  . metoprolol tartrate (LOPRESSOR) 25 MG tablet Take 25 mg by mouth 2 (two) times daily.       No current facility-administered medications on file prior to visit.    ALLERGIES: Allergies  Allergen Reactions  . Simvastatin     Elevated lfts?    FAMILY HISTORY: Family History  Problem Relation Age of Onset  . Stroke Father     after hernia suegery  . Prostate cancer Father   . Other Father     global transient amnesia, unclear source  . Other Mother     tachycardia.Marland KitchenMarland Kitchen?afib  . Atrial fibrillation Sister   . Healthy Brother   . Healthy Brother   . Coronary artery disease Paternal Grandmother   . Heart attack Paternal Grandmother 10  . Brain cancer Maternal Grandfather     ?  Marland Kitchen Cancer Paternal Grandfather     ?  Marland Kitchen Breast cancer Maternal Grandmother     SOCIAL HISTORY: History   Social History  . Marital Status: Married    Spouse Name: N/A    Number of Children: N/A  . Years of Education: N/A   Occupational History  . Director of business development    Social History Main Topics  . Smoking status: Never Smoker   . Smokeless tobacco: Never Used  . Alcohol Use: 4.2 oz/week    7 Glasses of wine per week     Comment: wine  . Drug Use: No  . Sexual Activity: Not on file   Other Topics Concern  . Not on file   Social History Narrative   Regular exercise , walking 4-5 times a week   Diet fruits and veggies, water    REVIEW OF SYSTEMS: Constitutional: No fevers, chills, or sweats, no generalized fatigue, change in appetite Eyes: No visual changes, double vision, eye pain Ear, nose and throat: No hearing loss, ear pain, nasal congestion, sore throat Cardiovascular: No chest pain, palpitations Respiratory:   No shortness of breath at rest or with exertion, wheezes GastrointestinaI: No nausea, vomiting, diarrhea, abdominal pain, fecal incontinence Genitourinary:  No dysuria, urinary retention or frequency Musculoskeletal:  No neck pain, back pain Integumentary: No rash, pruritus, skin lesions Neurological: as above Psychiatric: No depression, insomnia, anxiety Endocrine: No palpitations, fatigue, diaphoresis, mood swings, change in appetite, change in weight, increased thirst Hematologic/Lymphatic:  No anemia, purpura, petechiae. Allergic/Immunologic: no itchy/runny eyes, nasal congestion, recent allergic reactions, rashes  PHYSICAL EXAM: Filed Vitals:   05/08/13 1034  BP: 152/94  Pulse: 100  Resp: 14   General: No acute distress Head:  Normocephalic/atraumatic Neck: supple, no paraspinal tenderness, full range of motion Back: No paraspinal tenderness Heart: regular rate and rhythm Lungs: Clear to auscultation bilaterally. Vascular: No carotid bruits. Neurological Exam: Mental status: alert and oriented to person, place, and  time, speech fluent and not dysarthric, language intact. Cranial nerves: CN I: not tested CN II: pupils equal, round and reactive to light, visual fields intact, fundi unremarkable. CN III, IV, VI:  full range of motion, no nystagmus, no ptosis CN V: facial sensation intact CN VII: upper and lower face symmetric CN VIII: hearing intact CN IX, X: gag intact, uvula midline CN XI: sternocleidomastoid and trapezius muscles intact CN XII: tongue midline Bulk & Tone: normal, no fasciculations. Motor: 5/5 throughout Sensation: endorses reduced pinprick sensation of the anterior thighs down to the ankles, as well as the torso from T1 to approximately T11/T12.  Inguinal region intact. Deep Tendon Reflexes: 2+/brisk in upper extremities, 2- in patellars (right greater than left), and absent in ankles.  No Hoffman sign.  No Babinski Finger to nose testing: no  dysmetria Heel to shin: no dysmetria Gait: normal stance and stride.  Able to walk on toes, heels and in tandem. Romberg negative.  IMPRESSION: Numbness.  Strange presentation of unknown etiology.  Unusual descending pattern starting from torso.  Distribution, with sensory level, suggests myelopathy rather than peripheral neuropathy, most likely from the T1 level.  However, MRI was unremarkable and she does not exhibit any upper motor neuron signs.  Pattern and distribution of numbness is not really consistent with a peripheral sensory neuropathy.  The fast progression and spread of numbness would suggest an acute demyelinating process, if anything, however conditions such as Guillain Barre Syndrome are primarily muscle weakness and presents with an ascending pattern.  She does have reduced reflexes in the legs compared to the arms, however this may be due to diabetes as well.  PLAN: 1.  First, we will check the rest of the neuro-axis, namely MRI of brain and cervical spine with and without contrast. 2.  I will check some blood work, such as B12, B6, RPR and zinc 3.  If MRI unremarkable, then I would pursue NCV-EMG to look for evidence of a neuropathy to help guide further testing. 4.  Follow up soon.  45 minutes spent with patient, over 50% spent counseling and coordinating care. Thank you for allowing me to take part in the care of this patient.  Shon Millet, DO  CC:  Kerby Nora, MD

## 2013-05-10 LAB — ZINC: Zinc: 72 ug/dL (ref 60–130)

## 2013-05-11 ENCOUNTER — Telehealth: Payer: Self-pay | Admitting: Neurology

## 2013-05-11 ENCOUNTER — Telehealth: Payer: Self-pay | Admitting: Family Medicine

## 2013-05-11 NOTE — Telephone Encounter (Signed)
[place] office/telephone note [place]

## 2013-05-11 NOTE — Telephone Encounter (Signed)
Message copied by Benay SpiceSTONEMAN, Tasheem Elms H on Mon May 11, 2013  9:58 AM ------      Message from: JAFFE, ADAM R      Created: Mon May 11, 2013  6:48 AM       Please let Ms. Abril know that her B12 level is low (162).  This may be a cause of numbness.  She needs to get IM supplementation.  This would involve receiving 1000 mcg IM daily for 7 days, then weekly for 4 weeks, then once monthly for one year.  This can be done here in our office or at her PCP's office if she wishes.  Maybe the numbness will resolve, once her B12 level normalizes.  Since this is still an unusual presentation of numbness, I still want to pursue the MRI.  The other labs are still pending.      ----- Message -----         From: [facility]         Sent: 05/08/2013   4:15 PM           To: Cira ServantAdam Robert Jaffe, DO                   ------

## 2013-05-11 NOTE — Telephone Encounter (Signed)
Have her scheduled for : 1000 mcg IM daily for 7 days, then weekly for 4 weeks, then once monthly for one year.  The seven days would skip the weekend.

## 2013-05-11 NOTE — Telephone Encounter (Signed)
Spoke with the patient. Information given as per Dr. Everlena CooperJaffe. Will have B12 with her PCP. She will call them today to schedule. Will go forward with the MRI next week.

## 2013-05-11 NOTE — Telephone Encounter (Signed)
Please call Dr. Moises BloodJaffe's office to get dosiing and recommended schedule to begin ASAP. We can given them here.

## 2013-05-11 NOTE — Telephone Encounter (Signed)
Pt says her neurologist,  Dr. Everlena CooperJaffe has reccommended she receive B-12 injections.  Dr. Moises BloodJaffe's assistant suggested pt get injections at either their office or our office, which ever was most convenient for pt.  Pt says this office is only 5 mins away and would like to get the injections here.  Assistant told pt that in the beginning injections would have to be given everyday for 7 days in a row.  Can pt schedule the injections at our office? If so, are we to schedule an injection a day x7 days (?Sat. & Sun.?)? Thank you.

## 2013-05-12 ENCOUNTER — Ambulatory Visit (INDEPENDENT_AMBULATORY_CARE_PROVIDER_SITE_OTHER): Payer: 59

## 2013-05-12 DIAGNOSIS — E538 Deficiency of other specified B group vitamins: Secondary | ICD-10-CM

## 2013-05-12 LAB — METHYLMALONIC ACID, SERUM: Methylmalonic Acid, Quant: 0.1 umol/L (ref <0.40)

## 2013-05-12 MED ORDER — CYANOCOBALAMIN 1000 MCG/ML IJ SOLN
1000.0000 ug | Freq: Once | INTRAMUSCULAR | Status: AC
  Administered 2013-05-12: 1000 ug via INTRAMUSCULAR

## 2013-05-12 NOTE — Telephone Encounter (Signed)
Pt's 7 day scheduled, she will schedule weekly upon check-out today.

## 2013-05-13 ENCOUNTER — Ambulatory Visit (INDEPENDENT_AMBULATORY_CARE_PROVIDER_SITE_OTHER): Payer: 59

## 2013-05-13 ENCOUNTER — Other Ambulatory Visit: Payer: Self-pay

## 2013-05-13 DIAGNOSIS — E538 Deficiency of other specified B group vitamins: Secondary | ICD-10-CM

## 2013-05-13 LAB — VITAMIN B6: Vitamin B6: 12.8 ng/mL (ref 2.1–21.7)

## 2013-05-13 MED ORDER — CYANOCOBALAMIN 1000 MCG/ML IJ SOLN
1000.0000 ug | Freq: Once | INTRAMUSCULAR | Status: AC
  Administered 2013-05-13: 1000 ug via INTRAMUSCULAR

## 2013-05-13 MED ORDER — HYDROCODONE-ACETAMINOPHEN 5-325 MG PO TABS: 1.0000 | ORAL_TABLET | Freq: Four times a day (QID) | ORAL | Status: DC | PRN

## 2013-05-13 NOTE — Telephone Encounter (Signed)
Pt request rx hydrocodone apap. Call when ready for pick up. Pt took last hydrocodone apap last night and does not want to wait on Dr Daphine DeutscherBedsole's return 05/15/13 to get rx. Pt said for last 3 nights could not rest at night due to leg/foot pain that was achy dull pain with occasional shooting pain; pain level 8. Pt took hydrocodone apap last night and relieved pain and was able to sleep.Please advise.

## 2013-05-13 NOTE — Telephone Encounter (Signed)
i know this case.

## 2013-05-13 NOTE — Telephone Encounter (Signed)
Chandell notified prescription is ready to be picked up at front desk. 

## 2013-05-14 ENCOUNTER — Ambulatory Visit (INDEPENDENT_AMBULATORY_CARE_PROVIDER_SITE_OTHER): Payer: 59

## 2013-05-14 DIAGNOSIS — E538 Deficiency of other specified B group vitamins: Secondary | ICD-10-CM

## 2013-05-14 MED ORDER — CYANOCOBALAMIN 1000 MCG/ML IJ SOLN
1000.0000 ug | Freq: Once | INTRAMUSCULAR | Status: AC
  Administered 2013-05-14: 1000 ug via INTRAMUSCULAR

## 2013-05-15 ENCOUNTER — Encounter: Payer: Self-pay | Admitting: Radiology

## 2013-05-15 ENCOUNTER — Ambulatory Visit (INDEPENDENT_AMBULATORY_CARE_PROVIDER_SITE_OTHER): Payer: 59

## 2013-05-15 DIAGNOSIS — E538 Deficiency of other specified B group vitamins: Secondary | ICD-10-CM

## 2013-05-15 MED ORDER — CYANOCOBALAMIN 1000 MCG/ML IJ SOLN
1000.0000 ug | Freq: Once | INTRAMUSCULAR | Status: AC
  Administered 2013-05-15: 1000 ug via INTRAMUSCULAR

## 2013-05-18 ENCOUNTER — Ambulatory Visit (HOSPITAL_COMMUNITY)
Admission: RE | Admit: 2013-05-18 | Discharge: 2013-05-18 | Disposition: A | Discharge status: Home / Self Care | Payer: 59 | Source: Ambulatory Visit | Attending: Neurology | Admitting: Neurology

## 2013-05-18 ENCOUNTER — Ambulatory Visit (INDEPENDENT_AMBULATORY_CARE_PROVIDER_SITE_OTHER): Payer: 59

## 2013-05-18 DIAGNOSIS — R202 Paresthesia of skin: Secondary | ICD-10-CM

## 2013-05-18 DIAGNOSIS — E538 Deficiency of other specified B group vitamins: Secondary | ICD-10-CM

## 2013-05-18 DIAGNOSIS — M47812 Spondylosis without myelopathy or radiculopathy, cervical region: Secondary | ICD-10-CM | POA: Insufficient documentation

## 2013-05-18 DIAGNOSIS — R200 Anesthesia of skin: Secondary | ICD-10-CM

## 2013-05-18 DIAGNOSIS — M5020 Other cervical disc displacement, unspecified cervical region: Secondary | ICD-10-CM | POA: Insufficient documentation

## 2013-05-18 DIAGNOSIS — R209 Unspecified disturbances of skin sensation: Secondary | ICD-10-CM | POA: Insufficient documentation

## 2013-05-18 DIAGNOSIS — J3489 Other specified disorders of nose and nasal sinuses: Secondary | ICD-10-CM | POA: Insufficient documentation

## 2013-05-18 MED ORDER — CYANOCOBALAMIN 1000 MCG/ML IJ SOLN
1000.0000 ug | Freq: Once | INTRAMUSCULAR | Status: AC
  Administered 2013-05-18: 1000 ug via INTRAMUSCULAR

## 2013-05-18 MED ORDER — GADOBENATE DIMEGLUMINE 529 MG/ML IV SOLN
20.0000 mL | Freq: Once | INTRAVENOUS | Status: AC | PRN
  Administered 2013-05-18: 18 mL via INTRAVENOUS

## 2013-05-19 ENCOUNTER — Ambulatory Visit (INDEPENDENT_AMBULATORY_CARE_PROVIDER_SITE_OTHER): Payer: 59 | Admitting: *Deleted

## 2013-05-19 DIAGNOSIS — E538 Deficiency of other specified B group vitamins: Secondary | ICD-10-CM

## 2013-05-19 MED ORDER — CYANOCOBALAMIN 1000 MCG/ML IJ SOLN
1000.0000 ug | Freq: Once | INTRAMUSCULAR | Status: AC
  Administered 2013-05-19: 1000 ug via INTRAMUSCULAR

## 2013-05-20 ENCOUNTER — Ambulatory Visit: Payer: 59

## 2013-05-20 ENCOUNTER — Telehealth: Payer: Self-pay | Admitting: Neurology

## 2013-05-20 ENCOUNTER — Other Ambulatory Visit: Payer: Self-pay | Admitting: Neurology

## 2013-05-20 DIAGNOSIS — R209 Unspecified disturbances of skin sensation: Secondary | ICD-10-CM

## 2013-05-20 NOTE — Telephone Encounter (Signed)
MRIs do not show any cause for the numbness.  I would schedule her with Dr. Allena KatzPatel for EMG of arm and leg (either side) for 90 minute slot.

## 2013-05-20 NOTE — Telephone Encounter (Signed)
Patient calling for the results of her MRI brain and c spine. **Dr. Everlena CooperJaffe, please advise.

## 2013-05-20 NOTE — Telephone Encounter (Signed)
Spoke with the patient. Information given as per Dr. Everlena CooperJaffe below. The patient will go forward with EMG testing as recommended.

## 2013-05-21 ENCOUNTER — Encounter: Payer: Self-pay | Admitting: Radiology

## 2013-05-21 ENCOUNTER — Ambulatory Visit (INDEPENDENT_AMBULATORY_CARE_PROVIDER_SITE_OTHER): Payer: 59

## 2013-05-21 DIAGNOSIS — E538 Deficiency of other specified B group vitamins: Secondary | ICD-10-CM

## 2013-05-21 MED ORDER — CYANOCOBALAMIN 1000 MCG/ML IJ SOLN
1000.0000 ug | Freq: Once | INTRAMUSCULAR | Status: AC
  Administered 2013-05-21: 1000 ug via INTRAMUSCULAR

## 2013-05-26 ENCOUNTER — Ambulatory Visit (INDEPENDENT_AMBULATORY_CARE_PROVIDER_SITE_OTHER): Payer: 59

## 2013-05-26 DIAGNOSIS — E538 Deficiency of other specified B group vitamins: Secondary | ICD-10-CM

## 2013-05-26 MED ORDER — CYANOCOBALAMIN 1000 MCG/ML IJ SOLN
1000.0000 ug | Freq: Once | INTRAMUSCULAR | Status: AC
  Administered 2013-05-26: 1000 ug via INTRAMUSCULAR

## 2013-06-01 ENCOUNTER — Other Ambulatory Visit: Payer: Self-pay

## 2013-06-01 MED ORDER — HYDROCODONE-ACETAMINOPHEN 5-325 MG PO TABS: 1.0000 | ORAL_TABLET | Freq: Four times a day (QID) | ORAL | Status: DC | PRN

## 2013-06-01 NOTE — Telephone Encounter (Signed)
Pt left v/m requesting rx hydrocodone apap. Pt is out of medication. Call when ready for pick up. Pt takes hydrocodone apap for twitching legs and growing pains; pain is no better and may be worse; pt does not think can sleep at night without med.Please advise.

## 2013-06-01 NOTE — Telephone Encounter (Signed)
Pt last office visit 05/06/13. Medication last filled 05/13/13.  Ok to refill?

## 2013-06-01 NOTE — Telephone Encounter (Signed)
Left message on mobile, prescription is ready for pick up.

## 2013-06-01 NOTE — Telephone Encounter (Signed)
This is an atypical case. For Dr. Leonard SchwartzB., I will refill

## 2013-06-02 ENCOUNTER — Ambulatory Visit: Payer: 59 | Admitting: Neurology

## 2013-06-03 ENCOUNTER — Ambulatory Visit (INDEPENDENT_AMBULATORY_CARE_PROVIDER_SITE_OTHER): Payer: 59 | Admitting: *Deleted

## 2013-06-03 DIAGNOSIS — E538 Deficiency of other specified B group vitamins: Secondary | ICD-10-CM

## 2013-06-03 MED ORDER — CYANOCOBALAMIN 1000 MCG/ML IJ SOLN
1000.0000 ug | Freq: Once | INTRAMUSCULAR | Status: AC
  Administered 2013-06-03: 1000 ug via INTRAMUSCULAR

## 2013-06-05 ENCOUNTER — Ambulatory Visit: Payer: 59 | Admitting: Neurology

## 2013-06-08 ENCOUNTER — Telehealth: Payer: Self-pay | Admitting: *Deleted

## 2013-06-08 ENCOUNTER — Ambulatory Visit (INDEPENDENT_AMBULATORY_CARE_PROVIDER_SITE_OTHER): Payer: 59 | Admitting: *Deleted

## 2013-06-08 DIAGNOSIS — E538 Deficiency of other specified B group vitamins: Secondary | ICD-10-CM

## 2013-06-08 MED ORDER — CYANOCOBALAMIN 1000 MCG/ML IJ SOLN
1000.0000 ug | Freq: Once | INTRAMUSCULAR | Status: AC
  Administered 2013-06-08: 1000 ug via INTRAMUSCULAR

## 2013-06-16 ENCOUNTER — Encounter: Payer: Self-pay | Admitting: Family Medicine

## 2013-06-16 ENCOUNTER — Encounter: Payer: Self-pay | Admitting: Radiology

## 2013-06-16 ENCOUNTER — Ambulatory Visit (INDEPENDENT_AMBULATORY_CARE_PROVIDER_SITE_OTHER): Payer: 59 | Admitting: Neurology

## 2013-06-16 ENCOUNTER — Telehealth: Payer: Self-pay | Admitting: *Deleted

## 2013-06-16 ENCOUNTER — Encounter: Payer: Self-pay | Admitting: Neurology

## 2013-06-16 VITALS — BP 132/84 | HR 68 | Temp 98.0°F | Resp 20 | Ht 69.5 in | Wt 186.6 lb

## 2013-06-16 DIAGNOSIS — E538 Deficiency of other specified B group vitamins: Secondary | ICD-10-CM

## 2013-06-16 DIAGNOSIS — R202 Paresthesia of skin: Principal | ICD-10-CM

## 2013-06-16 DIAGNOSIS — R200 Anesthesia of skin: Secondary | ICD-10-CM

## 2013-06-16 DIAGNOSIS — R209 Unspecified disturbances of skin sensation: Secondary | ICD-10-CM

## 2013-06-16 NOTE — Patient Instructions (Signed)
Other than the B12, I do not have an answer for the numbness.  MRI of brain and spine look okay.  [facility].  Call with questions or concerns.  I would be happy to see you after your visit.

## 2013-06-16 NOTE — Progress Notes (Signed)
NEUROLOGY FOLLOW UP OFFICE NOTE  Mallory Stewart [id number]  HISTORY OF PRESENT ILLNESS: Mallory Stewart is a 53 year old right-handed woman with history of recent pancreatitis, transaminitis, type II diabetes mellitus, hypercholesterolemia, hypertension who follows up for sudden onset numbness in setting of vitamin B12 deficiency and diabetes.  Records and images were personally reviewed where available.    UPDATE: B12 was low and she was started on injections a month ago.  Had recent EMG which was normal.  The numbness has not spread, but the numbness in the legs feels more intense. She now notes that at night, she experiences an aching and uncomfortable feeling in her legs, as well as shooting pain in various areas of her legs.  Her legs sometimes jumps.  She is taking Vicodin for this discomfort.    HISTORY: She was recently admitted to [facility] from 04/08/13 to 04/10/13 for sudden onset epigastric abdominal pain associated with nausea and vomiting.  She was found to have a lipase greater than 10,000.  She was found to have elevated liver enzymes, which is pre-existing and thought to possibly have been secondary to statin therapy, which she has stopped.  Abdominal ultrasound revealed fatty liver.  She was diagnosed with pancreatitis, presumably secondary to history of alcoholism, however she disputes this.  She reports drinking approximately 1 to 2 glasses of wine about 2 or 3 times a week and has no history of excessive alcohol consumption.  While in the hospital, she began noticing numbness involving the anterior torso from the level of the mid-sternum to the waistline.  Over the past 2 to 3 weeks, the numbness spread down the anterior legs, including the thighs, knees and shins, down to the ankles.  It also spread to the perineal region. Over the past few days, the numbness involves the back of the legs as well, and now she notices numbness in her upper underarms and side of her thorax bilaterally.   She possibly thinks it may involve to dorsal forearms, but she is not sure if she is just being hypersensitive.  Symptoms are symmetric. There is no tingling, except for the shins.  There is no associated neuropathic pain.  She notes occasional neck pain radiating down both sides of neck.  She still has mild abdominal pain and epigastric discomfort, that resolves when lying down.  She denies back pain, bowel or bladder incontinence or retention, abnormal sweating, palpitations, visual symptoms, or headache.  She notes mild shortness of breath, which she was told is due to the pancreatitis.  She denies any extremity weakness, although sometimes she has a feeling that her legs will give out while walking.  There is no ataxia or falls.  Besides the pancreatitis, she has had no illness or flu-like symptoms.  She has never had similar episode before.  CT Abdomen & Pelvis with contrast (05/04/13): "peripancreatic inflammatory stranding, combatible with recently diagnosed pancreatitis.  2.1 cm fluid collection the junction of the pancreatic head and body, suggesting an early/developing pseudocyst.  No drainable fluid collection/abscess.  No pancreatic mass is seen.  MRI Thoracic Spine wo Contrast (05/06/13):  Unremarkable.  No abnormality involving the thoracic cord and conus medullaris.  No stenosis.  MRI Brain w/wo (05/18/13):  Unremarkable.  Small subcentimeter hyperintensity in right frontal white matter, but unremarkable.  MRI Cervical spine w/wo (05/18/13): no cord signal abnormalities.  Right foraminal and lateral recess impingement of C5 and C6 nerve roots.  Labs (04/27/13): WBC 11.5, Hgb 11, HCT 33.6, PLT 392,  MCV 93.4, Neut 79.9%, Lymphis 12.7%, Mono 3.5%, Eos 3.7%, TB 0.7, DB 0.2, AP 76, AST 54, ALT 26, TP 7.1, ALB 3.6, Lipase 11.  Labs (05/08/13):  B12 162, B6 12.8, Zinc 72, RPR nonreactive, glucose 265  NCV-EMG:  Performed last [facility] at [facility].  I don't have the results but reportedly  normal.  PAST MEDICAL HISTORY: Past Medical History  Diagnosis Date  . Hypertension   . Diabetes     MEDICATIONS: Current Outpatient Prescriptions on File Prior to Visit  Medication Sig Dispense Refill  . glucose blood test strip Onetouch ultra test strips Use as instructed to check blood sugars twice a day.  Dx: 250.00  100 each  11  . HYDROcodone-acetaminophen (NORCO/VICODIN) 5-325 MG per tablet Take 1 tablet by mouth every 6 (six) hours as needed for moderate pain.  40 tablet  0  . Lancets (ONETOUCH ULTRASOFT) lancets Use as instructed to check blood sugars twice a day       . lisinopril (PRINIVIL,ZESTRIL) 20 MG tablet TAKE 1 TABLET (20 MG TOTAL) BY MOUTH DAILY.  90 tablet  3  . metFORMIN (GLUCOPHAGE-XR) 500 MG 24 hr tablet TAKE 2 TABLETS BY MOUTH IN THE MORNING AND 1 TABLET BY MOUTH AT BEDTIME  270 tablet  3  . metoprolol tartrate (LOPRESSOR) 25 MG tablet Take 25 mg by mouth 2 (two) times daily.       No current facility-administered medications on file prior to visit.    ALLERGIES: Allergies  Allergen Reactions  . Simvastatin     Elevated lfts?    FAMILY HISTORY: Family History  Problem Relation Age of Onset  . Stroke Father     after hernia suegery  . Prostate cancer Father   . Other Father     global transient amnesia, unclear source  . Other Mother     tachycardia.Marland KitchenMarland Kitchen?afib  . Atrial fibrillation Sister   . Healthy Brother   . Healthy Brother   . Coronary artery disease Paternal Grandmother   . Heart attack Paternal Grandmother 8  . Brain cancer Maternal Grandfather     ?  Marland Kitchen Cancer Paternal Grandfather     ?  Marland Kitchen Breast cancer Maternal Grandmother     SOCIAL HISTORY: History   Social History  . Marital Status: Married    Spouse Name: N/A    Number of Children: N/A  . Years of Education: N/A   Occupational History  . Director of business development    Social History Main Topics  . Smoking status: Never Smoker   . Smokeless tobacco: Never Used  .  Alcohol Use: 4.2 oz/week    7 Glasses of wine per week     Comment: wine  . Drug Use: No  . Sexual Activity: Not on file   Other Topics Concern  . Not on file   Social History Narrative   Regular exercise , walking 4-5 times a week   Diet fruits and veggies, water    REVIEW OF SYSTEMS: Constitutional: No fevers, chills, or sweats, no generalized fatigue, change in appetite Eyes: No visual changes, double vision, eye pain Ear, nose and throat: No hearing loss, ear pain, nasal congestion, sore throat Cardiovascular: No chest pain, palpitations Respiratory:  No shortness of breath at rest or with exertion, wheezes GastrointestinaI: No nausea, vomiting, diarrhea, abdominal pain, fecal incontinence Genitourinary:  No dysuria, urinary retention or frequency Musculoskeletal:  No neck pain, back pain Integumentary: No rash, pruritus, skin lesions Neurological: as above Psychiatric:  No depression, insomnia, anxiety Endocrine: No palpitations, fatigue, diaphoresis, mood swings, change in appetite, change in weight, increased thirst Hematologic/Lymphatic:  No anemia, purpura, petechiae. Allergic/Immunologic: no itchy/runny eyes, nasal congestion, recent allergic reactions, rashes  PHYSICAL EXAM: Filed Vitals:   06/16/13 1043  BP: 132/84  Pulse: 68  Temp: 98 F (36.7 C)  Resp: 20   General: No acute distress Head:  Normocephalic/atraumatic Neck: supple, no paraspinal tenderness, full range of motion Heart:  Regular rate and rhythm Lungs:  Clear to auscultation bilaterally Back: No paraspinal tenderness Neurological Exam: alert and oriented to person, place, and time. Speech fluent and not dysarthric, language intact.  CN II-XII intact.  Bulk and tone normal, muscle strength 5/5 throughout.  Endorses reduced pinprick sensation of the anterior thighs down to the ankles, proximal underarms, as well as the torso from T1 to approximately T11/T12.  Deep tendon reflexes 2+ throughout, toes  downgoing.  Finger to nose intact.  Gait normal, Romberg negative.  IMPRESSION: Numbness and tingling, with sensory level at around T1.  MRI does not reveal any myelopathy.  NCV does not reveal peripheral neuropathy.  Could it be unusual presentation of small fiber neuropathy secondary to B12 deficiency?  PLAN: 1.  Recommend to continue B12 shots for now. 2.  Offered to start a neuropathic pain medication like gabapentin, but she would like to hold off for now. 3.  We will refer her to [facility] for a second opinion.  She may follow up with me after her consultation if she wishes.  30 minutes spent with patient, over 50% spent counseling and coordinating care.  Shon Millet, DO  CC: Kerby Nora, MD

## 2013-06-16 NOTE — Telephone Encounter (Signed)
I am waiting on a call back from Dr Jerold CoombeScott Burton [phone]  [facility]   Neuromuscular

## 2013-06-17 ENCOUNTER — Ambulatory Visit: Payer: 59

## 2013-06-17 ENCOUNTER — Telehealth: Payer: Self-pay | Admitting: Neurology

## 2013-06-17 NOTE — Telephone Encounter (Signed)
Patient is aware that it may be 6 weeks to 2 months before [facility] has an opening . She will wait for them to contact her with a date . I have faxed all of the records that were request form Okey RegalCarol at  [employer] [facility]

## 2013-06-17 NOTE — Telephone Encounter (Signed)
Pt calling for status of referral appt to [facility]. Advised pt I believe we are [facility]. [facility] has required some additional info prior to scheduling. Pt would like a current update to see how long before she will hear anything. She is apparently holding off on a work schedule pending this appt date. / Mallory RaiderSherri

## 2013-06-17 NOTE — Telephone Encounter (Signed)
[id number]

## 2013-06-18 ENCOUNTER — Ambulatory Visit (INDEPENDENT_AMBULATORY_CARE_PROVIDER_SITE_OTHER): Payer: 59 | Admitting: *Deleted

## 2013-06-18 ENCOUNTER — Encounter: Payer: 59 | Admitting: Neurology

## 2013-06-18 ENCOUNTER — Other Ambulatory Visit: Payer: Self-pay

## 2013-06-18 DIAGNOSIS — E538 Deficiency of other specified B group vitamins: Secondary | ICD-10-CM

## 2013-06-18 MED ORDER — CYANOCOBALAMIN 1000 MCG/ML IJ SOLN
1000.0000 ug | Freq: Once | INTRAMUSCULAR | Status: AC
  Administered 2013-06-18: 1000 ug via INTRAMUSCULAR

## 2013-06-18 MED ORDER — HYDROCODONE-ACETAMINOPHEN 5-325 MG PO TABS: 1.0000 | ORAL_TABLET | Freq: Four times a day (QID) | ORAL | Status: DC | PRN

## 2013-06-18 NOTE — Telephone Encounter (Signed)
Pt left v/m; pt request rx hydrocodone apap; pt having more pain at night, pt has stabbing pain and muscle jumps in legs at night. Pt taking 2 hydrocodone apap at night to take care of pain; pt saw Dr Everlena CooperJaffe yesterday and [facility]. Pt wondered if muscle relaxant would help. Pt has appt this afternoon for B 12 injection and pt is to repeat drug test today also.Pt is going on business trip on 06/21/13 and will not return until 06/26/13; pt request to pick up hydrocodone apap this afternoon when here for B 12 or by 06/19/13. Pt request cb.

## 2013-06-18 NOTE — Telephone Encounter (Signed)
Rx filled and signed.  I don't think muscle relaxant will help with this issue.

## 2013-06-18 NOTE — Telephone Encounter (Signed)
Patient in office for her B12 injection.  Prescription given to Shapale to give to patient and to let her know Dr. Ermalene SearingBedsole does not feel that a muscle relaxant will help.  Patient is also doing her UDS today.

## 2013-07-06 ENCOUNTER — Telehealth: Payer: Self-pay | Admitting: Neurology

## 2013-07-06 NOTE — Telephone Encounter (Signed)
Pt called and states that she has not heard anything from the referral that was sent to [facility]. It has been 3 weeks.  ° °Pt also states that she has numbness from under her breast to her ankle has gotten worse and it is  now is radiating to her feet.  °

## 2013-07-06 NOTE — Telephone Encounter (Signed)
Please advise on the below message I explained to patient that it could take up to 6 weeks for an app[facility]

## 2013-07-06 NOTE — Telephone Encounter (Signed)
Unfortunately, I have no explanation for her symptoms, which is why we are [facility].  It does take some time to schedule an appointment however.  If she is really concerned and wants to be seen by them, then she can go to their emergency room.

## 2013-07-06 NOTE — Telephone Encounter (Signed)
Please advise on below note 

## 2013-07-09 ENCOUNTER — Other Ambulatory Visit: Payer: Self-pay

## 2013-07-09 MED ORDER — HYDROCODONE-ACETAMINOPHEN 5-325 MG PO TABS: 1.0000 | ORAL_TABLET | Freq: Four times a day (QID) | ORAL | Status: DC | PRN

## 2013-07-09 NOTE — Telephone Encounter (Signed)
Pt request refill hydrocodone apap. Pt will not have enough med to go thru weekend. Call when ready for pick up.

## 2013-07-09 NOTE — Telephone Encounter (Signed)
Areal notified prescription is ready to be picked up at front desk. 

## 2013-07-10 NOTE — Telephone Encounter (Signed)
Pt called and states that she has not heard anything from the [facility] that [facility]. It has been 3 weeks.   Pt also states that she has numbness from under her breast to her ankle has gotten worse and it is  now is radiating to her feet.

## 2013-07-10 NOTE — Telephone Encounter (Signed)
I spoke with this patient to let her know that I am still waiting on [facility] Azuc[facility]Burton [facility] with [facility]  . She states she is getting worse her feet are both really numb . I called [facility] again to check on appt had to leave message for them to call me back the first contact with [facility] was 06/16/13  This patients cell number is [phone]

## 2013-07-15 ENCOUNTER — Encounter: Payer: Self-pay | Admitting: Radiology

## 2013-07-16 ENCOUNTER — Ambulatory Visit: Payer: 59

## 2013-07-17 ENCOUNTER — Encounter: Payer: Self-pay | Admitting: Family Medicine

## 2013-07-21 ENCOUNTER — Encounter: Payer: Self-pay | Admitting: Family Medicine

## 2013-07-21 ENCOUNTER — Ambulatory Visit (INDEPENDENT_AMBULATORY_CARE_PROVIDER_SITE_OTHER): Payer: 59 | Admitting: Family Medicine

## 2013-07-21 VITALS — BP 148/84 | HR 84 | Temp 97.9°F | Ht 69.5 in | Wt 185.5 lb

## 2013-07-21 DIAGNOSIS — E538 Deficiency of other specified B group vitamins: Secondary | ICD-10-CM

## 2013-07-21 MED ORDER — OXYCODONE-ACETAMINOPHEN 5-325 MG PO TABS: 1.0000 | ORAL_TABLET | Freq: Every day | ORAL | Status: DC

## 2013-07-21 MED ORDER — CYANOCOBALAMIN 1000 MCG/ML IJ SOLN
1000.0000 ug | Freq: Once | INTRAMUSCULAR | Status: AC
  Administered 2013-07-21: 1000 ug via INTRAMUSCULAR

## 2013-07-21 NOTE — Patient Instructions (Signed)
Trial of Percocet pain in in feet. Call if pain med not working as hope and you are interested in cahnging to neuropathic pain meds.

## 2013-07-21 NOTE — Progress Notes (Signed)
Pre visit review using our clinic review tool, if applicable. No additional management support is needed unless otherwise documented below in the visit note. 

## 2013-07-21 NOTE — Progress Notes (Signed)
Subjective:    Patient ID: Mallory PickerelKathleen Sherrard, female    DOB: April 17, 1961, 53 y.o.   MRN: [id number]  HPI  Mallory PickerelKathleen Luckey is a 53 year old right-handed woman with history of recent pancreatitis ( 04/2013), transaminitis, type II diabetes mellitus, hypercholesterolemia, hypertension  as well as sudden onset numbness in setting of vitamin B12 deficiency and diabetes.    She comes to clinic today to discuss pain control and insomnia.   Recent history includes :  B12 was low and she was started on injections a month ago. Had recent EMG which was normal. Unremarkable MRI thoracic spine and brain MRI Cervical spine w/wo (05/18/13): no cord signal abnormalities. Right foraminal and lateral recess impingement of C5 and C6 nerve roots.   Had appt with Dr. Everlena CooperJaffe Neurosurgery on 06/16/2013. [facility]. Has appt on 4/27  She has had no improvement at all, just worsening with b12 The numbness now through her feet, the numbness in the legs feels more intense. She now notes that at night, she experiences an aching and uncomfortable feeling in her legs, as well as shooting pain in various areas of her legs. Her legs sometimes jumps. She is taking Vicodin at night for this discomfort but it does not help. She cannot sleep at night due to these unusual sensations, pain.       Review of Systems  Constitutional: Negative for fever and fatigue.  HENT: Negative for ear pain.   Eyes: Negative for pain.  Respiratory: Negative for chest tightness and shortness of breath.   Cardiovascular: Negative for chest pain, palpitations and leg swelling.  Gastrointestinal: Negative for abdominal pain.  Genitourinary: Negative for dysuria.       Objective:   Physical Exam  Constitutional: She is oriented to person, place, and time. Vital signs are normal. She appears well-developed and well-nourished. She is cooperative.  Non-toxic appearance. She does not appear ill. No  distress.  HENT:  Head: Normocephalic.  Right Ear: Hearing, tympanic membrane, external ear and ear canal normal. Tympanic membrane is not erythematous, not retracted and not bulging.  Left Ear: Hearing, tympanic membrane, external ear and ear canal normal. Tympanic membrane is not erythematous, not retracted and not bulging.  Nose: No mucosal edema or rhinorrhea. Right sinus exhibits no maxillary sinus tenderness and no frontal sinus tenderness. Left sinus exhibits no maxillary sinus tenderness and no frontal sinus tenderness.  Mouth/Throat: Uvula is midline, oropharynx is clear and moist and mucous membranes are normal.  Eyes: Conjunctivae, EOM and lids are normal. Pupils are equal, round, and reactive to light. Lids are everted and swept, no foreign bodies found.  Neck: Trachea normal and normal range of motion. Neck supple. Carotid bruit is not present. No mass and no thyromegaly present.  Cardiovascular: Normal rate, regular rhythm, S1 normal, S2 normal, normal heart sounds, intact distal pulses and normal pulses.  Exam reveals no gallop and no friction rub.   No murmur heard. Pulmonary/Chest: Effort normal and breath sounds normal. Not tachypneic. No respiratory distress. She has no decreased breath sounds. She has no wheezes. She has no rhonchi. She has no rales.  Abdominal: Soft. Normal appearance and bowel sounds are normal. There is no tenderness.  Neurological: She is alert and oriented to person, place, and time. She has normal strength. A sensory deficit is present. No cranial nerve deficit. She displays a negative Romberg sign. Gait normal.  No sensation to monofilament in B feet and legs  Skin: Skin is warm, dry and intact. No rash noted.  Psychiatric: Her speech is normal and behavior is normal. Judgment and thought content normal. Her mood appears not anxious. Cognition and memory are normal. She does not exhibit a depressed mood.          Assessment & Plan:

## 2013-07-27 ENCOUNTER — Encounter: Payer: Self-pay | Admitting: Neurology

## 2013-07-31 ENCOUNTER — Other Ambulatory Visit: Payer: Self-pay

## 2013-07-31 MED ORDER — OXYCODONE-ACETAMINOPHEN 5-325 MG PO TABS: 1.0000 | ORAL_TABLET | Freq: Every day | ORAL | Status: DC

## 2013-07-31 NOTE — Telephone Encounter (Signed)
Mallory JohnsKathleen is requesting that her husband be able to pick up prescription this afternoon.  Advised she is due to repeat her toxicology test.  She can't come in today but promises me she will come in on Monday to do the test.  Ok if husband picks up prescription today.

## 2013-07-31 NOTE — Telephone Encounter (Signed)
Please call pt is she using 2 tabs at bed or more?

## 2013-07-31 NOTE — Telephone Encounter (Signed)
Dynastee notified prescription is ready to be picked up at front desk. 

## 2013-07-31 NOTE — Telephone Encounter (Signed)
Pt left v/m; pt said percocet is working much better than vicodin and pt request full month rx of percocet. Pt request cb when ready for pick up. Pt will be out of med on 08/03/13.

## 2013-07-31 NOTE — Telephone Encounter (Signed)
Patient states she is taking 2 tabs at bedtime.

## 2013-08-17 ENCOUNTER — Telehealth: Payer: Self-pay

## 2013-08-17 NOTE — Telephone Encounter (Signed)
Pt left v/m; pt out of town and cancelled b12 inj tomorrow and wants to reschedule this week. Pt request cb; left v/m for pt to cb.

## 2013-08-17 NOTE — Telephone Encounter (Signed)
Pt called back and scheduled 08/19/13 at 11AM nurse visit for Vit B 12 injection.

## 2013-08-18 ENCOUNTER — Ambulatory Visit: Payer: 59

## 2013-08-19 ENCOUNTER — Ambulatory Visit (INDEPENDENT_AMBULATORY_CARE_PROVIDER_SITE_OTHER): Payer: 59

## 2013-08-19 ENCOUNTER — Encounter: Payer: Self-pay | Admitting: Family Medicine

## 2013-08-19 DIAGNOSIS — E538 Deficiency of other specified B group vitamins: Secondary | ICD-10-CM

## 2013-08-19 MED ORDER — CYANOCOBALAMIN 1000 MCG/ML IJ SOLN
1000.0000 ug | Freq: Once | INTRAMUSCULAR | Status: AC
  Administered 2013-08-19: 1000 ug via INTRAMUSCULAR

## 2013-08-31 ENCOUNTER — Other Ambulatory Visit: Payer: Self-pay

## 2013-08-31 NOTE — Telephone Encounter (Signed)
Pt left v/m requesting rx oxycodone apap. Call when ready for pick up. [facility] and has return appt there mid July. Pt will need at least one more month of percocet.

## 2013-09-01 MED ORDER — OXYCODONE-ACETAMINOPHEN 5-325 MG PO TABS: 1.0000 | ORAL_TABLET | Freq: Every day | ORAL | Status: DC

## 2013-09-01 NOTE — Telephone Encounter (Signed)
Left message for Mallory JohnsKathleen that prescription is ready to be picked up at front desk.

## 2013-09-22 ENCOUNTER — Encounter: Payer: Self-pay | Admitting: Family Medicine

## 2013-09-22 ENCOUNTER — Ambulatory Visit: Payer: 59

## 2013-10-01 ENCOUNTER — Other Ambulatory Visit: Payer: Self-pay

## 2013-10-01 NOTE — Telephone Encounter (Signed)
Pt left v/m requesting rx oxycodone apap. Call when ready for pick up. Pt has appt at [facility] for nurse visit on 10/02/13 at 10 am; pt is going out of town on 10/03/13.

## 2013-10-02 ENCOUNTER — Ambulatory Visit (INDEPENDENT_AMBULATORY_CARE_PROVIDER_SITE_OTHER): Payer: 59

## 2013-10-02 DIAGNOSIS — E538 Deficiency of other specified B group vitamins: Secondary | ICD-10-CM

## 2013-10-02 MED ORDER — CYANOCOBALAMIN 1000 MCG/ML IJ SOLN
1000.0000 ug | Freq: Once | INTRAMUSCULAR | Status: AC
  Administered 2013-10-02: 1000 ug via INTRAMUSCULAR

## 2013-10-02 MED ORDER — OXYCODONE-ACETAMINOPHEN 5-325 MG PO TABS: 1.0000 | ORAL_TABLET | Freq: Every day | ORAL | Status: DC

## 2013-10-02 NOTE — Telephone Encounter (Signed)
Rx given to Rena to give to [name] at her nurse visit.

## 2013-10-27 ENCOUNTER — Ambulatory Visit: Payer: 59

## 2013-10-28 ENCOUNTER — Ambulatory Visit (INDEPENDENT_AMBULATORY_CARE_PROVIDER_SITE_OTHER): Payer: 59

## 2013-10-28 DIAGNOSIS — E538 Deficiency of other specified B group vitamins: Secondary | ICD-10-CM

## 2013-10-28 MED ORDER — CYANOCOBALAMIN 1000 MCG/ML IJ SOLN
1000.0000 ug | Freq: Once | INTRAMUSCULAR | Status: AC
  Administered 2013-10-28: 1000 ug via INTRAMUSCULAR

## 2013-11-03 ENCOUNTER — Other Ambulatory Visit: Payer: Self-pay

## 2013-11-03 MED ORDER — OXYCODONE-ACETAMINOPHEN 5-325 MG PO TABS: 1.0000 | ORAL_TABLET | Freq: Every day | ORAL | Status: DC

## 2013-11-03 NOTE — Telephone Encounter (Signed)
[name] notified prescription is ready to be picked up at front desk.

## 2013-11-03 NOTE — Telephone Encounter (Signed)
Pt left v/m requesting rx oxycodone apap. Call when ready for pick up. Pt will be going out of town on 11/04/13 in afternoon and will be out of med before returns home. Pt request cb so can pick up med before mid day 11/04/13.

## 2013-11-13 ENCOUNTER — Telehealth: Payer: Self-pay | Admitting: Family Medicine

## 2013-11-13 NOTE — Telephone Encounter (Signed)
Diabetic Bundle.  Pt needs follow up appt for BP check, LDL, and A1C.  Left vm to return call. 

## 2013-11-13 NOTE — Telephone Encounter (Signed)
Pt c/b, apptmt schedule 07/24 w/Dr. Ermalene SearingBedsole and labs 07/21. Pt states [facility] did A1c 07/092015 and she will bring in their results

## 2013-11-24 ENCOUNTER — Other Ambulatory Visit: Payer: 59

## 2013-11-24 ENCOUNTER — Other Ambulatory Visit: Payer: Self-pay | Admitting: Family Medicine

## 2013-11-24 ENCOUNTER — Telehealth: Payer: Self-pay | Admitting: Family Medicine

## 2013-11-24 DIAGNOSIS — E119 Type 2 diabetes mellitus without complications: Secondary | ICD-10-CM

## 2013-11-24 DIAGNOSIS — E7800 Pure hypercholesterolemia, unspecified: Secondary | ICD-10-CM

## 2013-11-24 NOTE — Telephone Encounter (Signed)
Message copied by Excell SeltzerBEDSOLE, AMY E on Tue Nov 24, 2013  7:16 AM ------      Message from: Alvina ChouWALSH, TERRI J      Created: Thu Nov 19, 2013  3:02 PM      Regarding: Lab orders for Tuesday, 7.21.15       Diabetic bundle, LDL. Had A1c checked @ [facility] ------

## 2013-11-25 ENCOUNTER — Telehealth: Payer: Self-pay

## 2013-11-25 NOTE — Telephone Encounter (Signed)
Pt left v/m; pt was seen at [facility] today for laceration to finger; [facility] was going to prescribe percocet for pain,( pt stated severe cut to finger). [facility] that she was already getting percocet from Dr Ermalene SearingBedsole and is under contract with PCP. Pt request percocet rx. Call when ready for pick up.

## 2013-11-26 MED ORDER — OXYCODONE-ACETAMINOPHEN 5-325 MG PO TABS: 1.0000 | ORAL_TABLET | Freq: Every day | ORAL | Status: DC

## 2013-11-26 NOTE — Telephone Encounter (Signed)
Prescription reprinted so Dr. Patsy Lageropland could sign Rx since Dr. Ermalene SearingBedsole is not in the office until Friday.  Mallory JohnsKathleen notified prescription will be ready to pick up after lunch time today.

## 2013-11-26 NOTE — Addendum Note (Signed)
Addended by: Damita LackLORING, Murry Diaz S on: 11/26/2013 11:04 AM   Modules accepted: Orders

## 2013-11-27 ENCOUNTER — Ambulatory Visit: Payer: 59 | Admitting: Family Medicine

## 2013-11-27 ENCOUNTER — Telehealth: Payer: Self-pay

## 2013-11-27 MED ORDER — OXYCODONE-ACETAMINOPHEN 5-325 MG PO TABS: 1.0000 | ORAL_TABLET | Freq: Four times a day (QID) | ORAL | Status: DC | PRN

## 2013-11-27 NOTE — Telephone Encounter (Signed)
Patient advised.  Rx left at front desk for pick up. 

## 2013-11-27 NOTE — Telephone Encounter (Signed)
Reprinted, sig changed. Thanks.  Routed to PCP as FYI.

## 2013-11-27 NOTE — Addendum Note (Signed)
Addended by: Joaquim NamUNCAN, Jayce Kainz S on: 11/27/2013 02:08 PM   Modules accepted: Orders

## 2013-11-27 NOTE — Telephone Encounter (Signed)
Pt brought oxycodone apap rx back; pharmacy told pt could not fill until 12/02/13 with current instructions of 1-2 tabs at hs. Pt said [facility] doctor was going to give oxycodone apap to take 1 tab q 6 h prn for pain. Pt said pain level now is 8. Pt will be going out of town on 11/30/13 and request cb about getting new rx with new instructions. Pt request cb. Dr Ermalene SearingBedsole and Dr Patsy Lageropland out of office. 11/26/13 oxycodone rx given to Lugene to  Hold until decision by Dr Para Marchuncan.

## 2013-11-27 NOTE — Telephone Encounter (Signed)
error 

## 2013-11-30 ENCOUNTER — Other Ambulatory Visit (INDEPENDENT_AMBULATORY_CARE_PROVIDER_SITE_OTHER): Payer: 59

## 2013-11-30 DIAGNOSIS — E7800 Pure hypercholesterolemia, unspecified: Secondary | ICD-10-CM

## 2013-11-30 LAB — COMPREHENSIVE METABOLIC PANEL
ALT: 61 U/L — ABNORMAL HIGH (ref 0–35)
AST: 57 U/L — ABNORMAL HIGH (ref 0–37)
Albumin: 4.2 g/dL (ref 3.5–5.2)
Alkaline Phosphatase: 57 U/L (ref 39–117)
BILIRUBIN TOTAL: 0.7 mg/dL (ref 0.2–1.2)
BUN: 12 mg/dL (ref 6–23)
CO2: 26 mEq/L (ref 19–32)
CREATININE: 0.8 mg/dL (ref 0.4–1.2)
Calcium: 9.2 mg/dL (ref 8.4–10.5)
Chloride: 100 mEq/L (ref 96–112)
GFR: 81.6 mL/min (ref >60.00)
Glucose, Bld: 125 mg/dL — ABNORMAL HIGH (ref 70–99)
Potassium: 4.5 mEq/L (ref 3.5–5.1)
Sodium: 137 mEq/L (ref 135–145)
Total Protein: 6.8 g/dL (ref 6.0–8.3)

## 2013-11-30 LAB — LIPID PANEL
Cholesterol: 242 mg/dL — ABNORMAL HIGH (ref 0–200)
HDL: 86.3 mg/dL (ref >39.00)
LDL Cholesterol: 128 mg/dL — ABNORMAL HIGH (ref 0–99)
NonHDL: 155.7
Total CHOL/HDL Ratio: 3
Triglycerides: 139 mg/dL (ref 0.0–149.0)
VLDL: 27.8 mg/dL (ref 0.0–40.0)

## 2013-12-01 ENCOUNTER — Ambulatory Visit: Payer: 59

## 2013-12-08 ENCOUNTER — Ambulatory Visit: Payer: 59 | Admitting: Family Medicine

## 2013-12-17 ENCOUNTER — Ambulatory Visit: Payer: 59 | Admitting: Family Medicine

## 2014-01-04 ENCOUNTER — Other Ambulatory Visit: Payer: Self-pay | Admitting: *Deleted

## 2014-01-04 NOTE — Telephone Encounter (Signed)
Patient left a voicemail that she had to cancel her appointment tomorrow and reschedule for Friday. Patient stated that she will be out of her Percocet after tonight and would like a refill or at least enough to last her until her appointment. See history of cancellations.

## 2014-01-05 ENCOUNTER — Ambulatory Visit: Payer: 59

## 2014-01-05 ENCOUNTER — Ambulatory Visit: Payer: 59 | Admitting: Family Medicine

## 2014-01-05 MED ORDER — OXYCODONE-ACETAMINOPHEN 5-325 MG PO TABS: 1.0000 | ORAL_TABLET | Freq: Four times a day (QID) | ORAL | Status: DC | PRN

## 2014-01-05 NOTE — Telephone Encounter (Signed)
Pt left v/m; pt is going out of town in 2-3 hours  And needs to pick up rx for percocet; pt only has 1 pill left. Pt has appt to see Dr Ermalene Searing on 01/08/14.pt request cb ASAP.

## 2014-01-05 NOTE — Telephone Encounter (Signed)
Pt notified rx at front desk for pick up.

## 2014-01-08 ENCOUNTER — Ambulatory Visit (INDEPENDENT_AMBULATORY_CARE_PROVIDER_SITE_OTHER): Payer: 59 | Admitting: Family Medicine

## 2014-01-08 ENCOUNTER — Encounter: Payer: Self-pay | Admitting: Family Medicine

## 2014-01-08 VITALS — BP 169/104 | HR 110 | Temp 98.2°F | Ht 69.5 in | Wt 188.5 lb

## 2014-01-08 DIAGNOSIS — I10 Essential (primary) hypertension: Secondary | ICD-10-CM

## 2014-01-08 DIAGNOSIS — R748 Abnormal levels of other serum enzymes: Secondary | ICD-10-CM

## 2014-01-08 DIAGNOSIS — G609 Hereditary and idiopathic neuropathy, unspecified: Secondary | ICD-10-CM

## 2014-01-08 DIAGNOSIS — R209 Unspecified disturbances of skin sensation: Secondary | ICD-10-CM

## 2014-01-08 DIAGNOSIS — E538 Deficiency of other specified B group vitamins: Secondary | ICD-10-CM

## 2014-01-08 DIAGNOSIS — E7800 Pure hypercholesterolemia, unspecified: Secondary | ICD-10-CM

## 2014-01-08 DIAGNOSIS — R200 Anesthesia of skin: Secondary | ICD-10-CM

## 2014-01-08 DIAGNOSIS — G5793 Unspecified mononeuropathy of bilateral lower limbs: Secondary | ICD-10-CM

## 2014-01-08 DIAGNOSIS — E119 Type 2 diabetes mellitus without complications: Secondary | ICD-10-CM

## 2014-01-08 LAB — HM DIABETES FOOT EXAM

## 2014-01-08 MED ORDER — LISINOPRIL 20 MG PO TABS: ORAL_TABLET | ORAL | Status: DC

## 2014-01-08 MED ORDER — CYANOCOBALAMIN 1000 MCG/ML IJ SOLN
1000.0000 ug | Freq: Once | INTRAMUSCULAR | Status: AC
  Administered 2014-01-08: 1000 ug via INTRAMUSCULAR

## 2014-01-08 MED ORDER — GLUCOSE BLOOD VI STRP: ORAL_STRIP | Status: DC

## 2014-01-08 MED ORDER — METFORMIN HCL ER 500 MG PO TB24: ORAL_TABLET | ORAL | Status: DC

## 2014-01-08 NOTE — Progress Notes (Signed)
Pre visit review using our clinic review tool, if applicable. No additional management support is needed unless otherwise documented below in the visit note. 

## 2014-01-08 NOTE — Patient Instructions (Signed)
Continue working on healthy eating and  weight loss.  Try to swim for exercise.  Follow up in 6 months with fasting labs prior.

## 2014-01-08 NOTE — Progress Notes (Signed)
Subjective:    Patient ID: Mallory Stewart, female    DOB: [birth year], 53 y.o.   MRN: [id number]  HPI  Mallory Stewart is a 53 year old right-handed woman with history of recent pancreatitis ( 04/2013), transaminitis, type II diabetes mellitus, hypercholesterolemia, hypertension as well as sudden onset numbness in setting of vitamin B12 deficiency and diabetes.   She comes to clinic today to discuss DM  and numbness.  Diabetes:  Well controlled on metformin.  Last check at [facility] was 5.5.  She is not interested in lower the meds. Lab Results  Component Value Date   HGBA1C 5.9 02/20/2012  Using medications without difficulties:None Hypoglycemic episodes:None Hyperglycemic episodes:None Feet problems:no ulcers Blood Sugars averaging: FBS 103-130 eye exam within last year: due     Recent history includes :  B12 was low and she was started on injections a month ago. Had recent EMG which was normal. Unremarkable MRI thoracic spine and brain  MRI Cervical spine w/wo (05/18/13): no cord signal abnormalities. Right foraminal and lateral recess impingement of C5 and C6 nerve roots.   [facility]ed at [facility] Dr. Ezequiel Essex. Multiple nerve conduction inconclusive, nerves looked good, but no reflexes. She has had no improvement at all, just worsening with b12.    She has upcoming small nerve test. Has follow up 10/19. She continues to have pain.  Using percocet for pain. ( 1-2 at night)  Hypertension:   BP usually well controlled but when in waiting room today called to be told mother in law unresponsive and on way to ER. BP Readings from Last 3 Encounters:  01/08/14 169/104  07/21/13 148/84  06/16/13 132/84  Using medication without problems or lightheadedness: None Chest pain with exertion:None Edema:None Short of breath:NOne Average home BPs: THIS Am 139/77 Other issues:  Wt Readings from Last 3 Encounters:  01/08/14 188 lb 8 oz (85.503 kg)  07/21/13 185 lb 8 oz  (84.142 kg)  06/16/13 186 lb 9.6 oz (84.641 kg)  She has been working on  [employer].  Fatty liver LFTs improved with weight loss.  Elevated Cholesterol: LDL not at goal < 100. Lab Results  Component Value Date   CHOL 242* 11/30/2013   HDL 86.30 11/30/2013   LDLCALC 128* 11/30/2013   LDLDIRECT 160.5 02/20/2012   TRIG 139.0 11/30/2013   CHOLHDL 3 11/30/2013   Diet compliance:moderate Exercise:limited Other complaints:   Review of Systems  Constitutional: Negative for fever and fatigue.  HENT: Negative for ear pain.   Eyes: Negative for pain.  Respiratory: Negative for chest tightness and shortness of breath.   Cardiovascular: Negative for chest pain, palpitations and leg swelling.  Gastrointestinal: Negative for abdominal pain.  Genitourinary: Negative for dysuria.       Objective:   Physical Exam  Constitutional: She is oriented to person, place, and time. Vital signs are normal. She appears well-developed and well-nourished. She is cooperative.  Non-toxic appearance. She does not appear ill. No distress.  HENT:  Head: Normocephalic.  Right Ear: Hearing, tympanic membrane, external ear and ear canal normal. Tympanic membrane is not erythematous, not retracted and not bulging.  Left Ear: Hearing, tympanic membrane, external ear and ear canal normal. Tympanic membrane is not erythematous, not retracted and not bulging.  Nose: No mucosal edema or rhinorrhea. Right sinus exhibits no maxillary sinus tenderness and no frontal sinus tenderness. Left sinus exhibits no maxillary sinus tenderness and no frontal sinus tenderness.  Mouth/Throat: Uvula is midline, oropharynx is clear and moist and  mucous membranes are normal.  Eyes: Conjunctivae, EOM and lids are normal. Pupils are equal, round, and reactive to light. Lids are everted and swept, no foreign bodies found.  Neck: Trachea normal and normal range of motion. Neck supple. Carotid bruit is not present. No mass and no thyromegaly  present.  Cardiovascular: Normal rate, regular rhythm, S1 normal, S2 normal, normal heart sounds, intact distal pulses and normal pulses.  Exam reveals no gallop and no friction rub.   No murmur heard. Pulmonary/Chest: Effort normal and breath sounds normal. Not tachypneic. No respiratory distress. She has no decreased breath sounds. She has no wheezes. She has no rhonchi. She has no rales.  Abdominal: Soft. Normal appearance and bowel sounds are normal. There is no tenderness.  Neurological: She is alert and oriented to person, place, and time. She has normal strength. A sensory deficit is present. No cranial nerve deficit. She displays a negative Romberg sign. Gait normal.  No sensation to monofilament in B feet and legs   Skin: Skin is warm, dry and intact. No rash noted.  Psychiatric: Her speech is normal and behavior is normal. Judgment and thought content normal. Her mood appears not anxious. Cognition and memory are normal. She does not exhibit a depressed mood.   Diabetic foot exam: Normal inspection No skin breakdown No calluses  Normal DP pulses Abnormal sensation to light touch and monofilament Nails normal        Assessment & Plan:

## 2014-02-04 ENCOUNTER — Telehealth: Payer: Self-pay

## 2014-02-04 NOTE — Telephone Encounter (Signed)
Pt left v/m requesting refill oxycodone apap.call when ready for pick up. Pt has 2 pills left and request to pick up rx today or 02/05/14.Please advise.

## 2014-02-05 MED ORDER — OXYCODONE-ACETAMINOPHEN 5-325 MG PO TABS: 1.0000 | ORAL_TABLET | Freq: Every day | ORAL | Status: DC

## 2014-02-05 NOTE — Telephone Encounter (Signed)
Mallory Stewart Johnsnotified by [name] that prescription is ready to be picked up.

## 2014-02-05 NOTE — Telephone Encounter (Signed)
Mallory JohnsKathleen called to say she normally gets #60 but this prescription was only written for #30.  Per Dr. Ermalene SearingBedsole ok to change to #60.  Informed Mallory JohnsKathleen to bring prescription back that she picked up earlier and we would give her a new Rx for #60.  Mallory JohnsKathleen states she has already gotten that prescription filled.  Advised she can pick up new prescription then around October 15 or 16 and that one will be written for #60.  Patient states understanding.

## 2014-02-05 NOTE — Addendum Note (Signed)
Addended by: Damita LackLORING, DONNA S on: 02/05/2014 02:20 PM   Modules accepted: Orders

## 2014-02-09 ENCOUNTER — Ambulatory Visit (INDEPENDENT_AMBULATORY_CARE_PROVIDER_SITE_OTHER): Payer: 59

## 2014-02-09 DIAGNOSIS — E538 Deficiency of other specified B group vitamins: Secondary | ICD-10-CM

## 2014-02-09 MED ORDER — CYANOCOBALAMIN 1000 MCG/ML IJ SOLN: 1000.0000 ug | Freq: Once | INTRAMUSCULAR | Status: DC

## 2014-02-09 MED ORDER — CYANOCOBALAMIN 1000 MCG/ML IJ SOLN
1000.0000 ug | Freq: Once | INTRAMUSCULAR | Status: AC
  Administered 2014-02-09: 1000 ug via INTRAMUSCULAR

## 2014-02-10 ENCOUNTER — Other Ambulatory Visit: Payer: Self-pay | Admitting: *Deleted

## 2014-02-10 DIAGNOSIS — G5793 Unspecified mononeuropathy of bilateral lower limbs: Secondary | ICD-10-CM

## 2014-02-10 NOTE — Telephone Encounter (Signed)
Left message for Jae DireKate to turn my call.

## 2014-02-10 NOTE — Telephone Encounter (Signed)
Message copied by Damita LackLORING, Mandy Fitzwater S on Wed Feb 10, 2014 12:32 PM ------      Message from: Dala DockHAMBERS, ALLISON R      Created: Wed Feb 10, 2014 10:29 AM       Please call pt on cell #[phone] ------

## 2014-02-10 NOTE — Telephone Encounter (Signed)
Spoke with Mallory Stewart.  She is requesting Dr. Ermalene SearingBedsole [place].  She states [facility] is not being very responsive and feels like they are going to discharge her anyway.   She also states now her feet are oddly numb associated with a lot of pain that is waking her up during the night.  She states she has taken one percocet before bedtime and then waking up twice during the night and having to take a percocet each time to relieve her pain so she can sleep.  She states she is in pain all of the time.  Requesting Dr. Ermalene SearingBedsole  increase her dosing instructions from 1-2 tablets at bedtime to where she can take it more often until she can get in with a pain clinic.  Advised Dr. Ermalene SearingBedsole would be back in the office on Thursday and I send her these request.

## 2014-02-11 DIAGNOSIS — G5793 Unspecified mononeuropathy of bilateral lower limbs: Secondary | ICD-10-CM | POA: Insufficient documentation

## 2014-02-11 MED ORDER — OXYCODONE-ACETAMINOPHEN 5-325 MG PO TABS: 1.0000 | ORAL_TABLET | Freq: Three times a day (TID) | ORAL | Status: DC | PRN

## 2014-02-11 NOTE — Addendum Note (Signed)
Addended by: Damita LackLORING, DONNA S on: 02/11/2014 03:02 PM   Modules accepted: Orders

## 2014-02-11 NOTE — Assessment & Plan Note (Signed)
Continue working on healthy eating and  weight loss.  Try to swim for exercise. Well controlled on metformin.

## 2014-02-11 NOTE — Assessment & Plan Note (Signed)
Usually well controlled on current meds.

## 2014-02-11 NOTE — Telephone Encounter (Signed)
Is is okay to print out new prescription with new instructions and for #60 for patient to have on hand to refill?  She is working out of town all next week.  Please Advise.

## 2014-02-11 NOTE — Telephone Encounter (Addendum)
Agreed. I will refer to pain center. Please also change percocet instructions to 1-2 tabs every eight hours as needed for pain.

## 2014-02-11 NOTE — Telephone Encounter (Signed)
Mallory Stewart notified prescription is ready to be picked up at front desk but can not get it filled until 02/18/2014 per Dr. Ermalene SearingBedsole.

## 2014-02-11 NOTE — Assessment & Plan Note (Signed)
On chronic pain meds. Still poor explanation of cause. [facility]

## 2014-02-11 NOTE — Assessment & Plan Note (Signed)
Improved with weight loss 

## 2014-02-11 NOTE — Assessment & Plan Note (Signed)
Inadequate control. Encouraged exercise, weight loss, healthy eating habits.

## 2014-03-08 ENCOUNTER — Other Ambulatory Visit: Payer: Self-pay

## 2014-03-08 NOTE — Telephone Encounter (Signed)
Pt left v/m requesting rx oxycodone apap. Call when ready for pi ck up. Pt has enough med until 03/09/14 and pt request to pick up rx on 03/09/14.Please advise.

## 2014-03-09 MED ORDER — OXYCODONE-ACETAMINOPHEN 5-325 MG PO TABS: 1.0000 | ORAL_TABLET | Freq: Three times a day (TID) | ORAL | Status: DC | PRN

## 2014-03-09 NOTE — Telephone Encounter (Signed)
Mallory Stewart notified prescription is ready to be picked up at front desk. 

## 2014-03-11 ENCOUNTER — Ambulatory Visit (INDEPENDENT_AMBULATORY_CARE_PROVIDER_SITE_OTHER): Payer: 59

## 2014-03-11 DIAGNOSIS — E538 Deficiency of other specified B group vitamins: Secondary | ICD-10-CM

## 2014-03-11 MED ORDER — CYANOCOBALAMIN 1000 MCG/ML IJ SOLN
1000.0000 ug | Freq: Once | INTRAMUSCULAR | Status: AC
  Administered 2014-03-11: 1000 ug via INTRAMUSCULAR

## 2014-03-16 ENCOUNTER — Ambulatory Visit: Payer: 59

## 2014-03-24 ENCOUNTER — Other Ambulatory Visit: Payer: Self-pay

## 2014-03-24 NOTE — Telephone Encounter (Signed)
Pt left v/m requesting rx oxycodone apap. Call when ready for pick up. Pt does not need until 03/29/14.

## 2014-03-25 MED ORDER — OXYCODONE-ACETAMINOPHEN 5-325 MG PO TABS: 1.0000 | ORAL_TABLET | Freq: Three times a day (TID) | ORAL | Status: DC | PRN

## 2014-03-25 NOTE — Telephone Encounter (Signed)
Left message for Mallory Stewart that her prescription is ready to be picked up at front desk.

## 2014-03-30 ENCOUNTER — Telehealth: Payer: Self-pay

## 2014-03-30 NOTE — Telephone Encounter (Signed)
Pt saw pain mgt; pt did not have a good experience at pain mgt. Pt is concerned about Hippa violation because pts entire name was on urine container and there were 5 containers with pts names on them when pt opened small window in restroom to place her urine container. There was no alternative treatment other than medications discussed; pt did not feel like Dr Dorena BodoMark Scheutzow listened to her at all; Dr Rayna SextonScheutzow did not tell pt name of second med ordered; pt was told one med was neuropathic and the other was for pain. Pt was prescribed Nucynta and another med (pt was not given the name of 2nd med). Pt has not picked up rx yet; pt has enough percocet for 2 weeks and pt will pick up meds but will not start to take meds until hears from Dr Ermalene SearingBedsole; pt has signed controlled substance contract with Dr Ermalene SearingBedsole. Pt is not sure she feels comfortable taking Nucynta because the drug profile is so scary. Pt understands Dr Ermalene SearingBedsole will not be in office until 04/05/14 and pt said a call next week is OK.

## 2014-04-05 NOTE — Telephone Encounter (Signed)
Spoke with [name].  She is wanting your opionion on the medications that was prescribed by Dr. Rayna SextonScheutzow which is Nucynta and Gabapentin. She felt that this MD did not take the time to listen to her and just wrote down the two meds and said her try these which she states she has no comfort level with.  He did not give her any instructions on how to wean off the Percocet before starting the Nucynta. She states the other reason she called was because she had signed a pain contract with us and wanted to let us know the name of the medication prescribed by the pain doctor.

## 2014-04-05 NOTE — Telephone Encounter (Signed)
Please call pt to find out how she would like me to help her.

## 2014-04-06 NOTE — Telephone Encounter (Signed)
Left message for call back. Please get me out of room to speak with her when she calls back.

## 2014-04-07 NOTE — Telephone Encounter (Signed)
[name] is out of town and can be reached on her cell phone [phone].

## 2014-04-07 NOTE — Telephone Encounter (Signed)
Discussed mds with pt in detail. Will start gabapentin, plans to stay on percocet. Will call pain md to let them know and to ask about what to do with nyucynta.

## 2014-04-15 NOTE — Telephone Encounter (Signed)
Pt left v/m; pt is going to take gabapentin but is not going to start Nycynta;pt has appt with [facility] to discuss why pt does not want to take Nucynta but pt will be out of oxycodone on Mon and pt will be traveling the rest of next week. Pt request rx for oxycodone to be written by Dr Ermalene SearingBedsole until can discuss with [facility].Please advise.pt request cb.

## 2014-04-16 MED ORDER — OXYCODONE-ACETAMINOPHEN 5-325 MG PO TABS: 1.0000 | ORAL_TABLET | Freq: Three times a day (TID) | ORAL | Status: DC | PRN

## 2014-04-16 NOTE — Telephone Encounter (Signed)
Mallory Stewart notified prescription was updated and is now ready to be picked up at front desk.

## 2014-04-16 NOTE — Telephone Encounter (Signed)
Mallory Stewart notified prescription is ready to be picked up at front desk.  Advised per Dr. Ermalene SearingBedsole, she will not be able to fill new Rx until 04/24/2014.  Mallory Stewart states that is not right.  That her instructions are to take 1 to 2 tablets up to three times a day so #60 is only a 10 day supply. She is also stating she is not taking it that often but she does not have enough to get her to 04/24/2014.  Advised I would send Dr. Ermalene SearingBedsole a note to see if she will ok early refill.  Please Advise.

## 2014-04-16 NOTE — Telephone Encounter (Signed)
Agreed. Discussed with pt. Let her know I will put fill dat as 12/19 ( Last refill 11/19)

## 2014-04-16 NOTE — Telephone Encounter (Signed)
Okay I see where I was incorrect. I thought it was twice daily. Okay to fill 60, can delete "DO Not Fill UNTIL"

## 2014-04-20 ENCOUNTER — Ambulatory Visit (INDEPENDENT_AMBULATORY_CARE_PROVIDER_SITE_OTHER): Payer: 59

## 2014-04-20 DIAGNOSIS — E538 Deficiency of other specified B group vitamins: Secondary | ICD-10-CM

## 2014-04-20 MED ORDER — CYANOCOBALAMIN 1000 MCG/ML IJ SOLN
1000.0000 ug | Freq: Once | INTRAMUSCULAR | Status: AC
  Administered 2014-04-20: 1000 ug via INTRAMUSCULAR

## 2014-04-21 LAB — HM DIABETES EYE EXAM

## 2014-05-05 ENCOUNTER — Other Ambulatory Visit: Payer: Self-pay | Admitting: Family Medicine

## 2014-05-05 NOTE — Telephone Encounter (Signed)
Last filled 04/16/14.  Pt states that she will be out of medication before Monday and is requesting refill.  Routed to Dr. Patsy Lageropland.

## 2014-05-05 NOTE — Telephone Encounter (Signed)
Non-emergent, Dr. B here in the AM

## 2014-05-06 MED ORDER — OXYCODONE-ACETAMINOPHEN 5-325 MG PO TABS: 1.0000 | ORAL_TABLET | Freq: Three times a day (TID) | ORAL | Status: DC | PRN

## 2014-05-06 NOTE — Telephone Encounter (Signed)
Pt left v/m requesting status of refill; pt will be out of med on 05/10/14 and going out of town on 05/11/14. Pt request cb today.

## 2014-05-10 NOTE — Telephone Encounter (Signed)
Informed pt RX ready to be picked up.

## 2014-05-25 ENCOUNTER — Ambulatory Visit: Payer: 59

## 2014-05-27 ENCOUNTER — Other Ambulatory Visit: Payer: Self-pay | Admitting: *Deleted

## 2014-05-27 ENCOUNTER — Ambulatory Visit (INDEPENDENT_AMBULATORY_CARE_PROVIDER_SITE_OTHER): Payer: 59 | Admitting: *Deleted

## 2014-05-27 DIAGNOSIS — D519 Vitamin B12 deficiency anemia, unspecified: Secondary | ICD-10-CM

## 2014-05-27 MED ORDER — CYANOCOBALAMIN 1000 MCG/ML IJ SOLN
1000.0000 ug | Freq: Once | INTRAMUSCULAR | Status: AC
  Administered 2014-05-27: 1000 ug via INTRAMUSCULAR

## 2014-05-27 NOTE — Telephone Encounter (Signed)
Pt request refill while here for labs. She has enough to get through the weekend, but requesting early due to inclement weather. Pt had an appt scheduled with pain clinic, but the office had to cancel that appt. They didn't reschedule her until next Fri.

## 2014-06-01 NOTE — Telephone Encounter (Signed)
Pt left v/m requesting status of oxycodone apap refill. Pt request cb.

## 2014-06-02 ENCOUNTER — Other Ambulatory Visit: Payer: Self-pay | Admitting: Family Medicine

## 2014-06-02 NOTE — Telephone Encounter (Signed)
See phone note from 06/02/2014.

## 2014-06-02 NOTE — Telephone Encounter (Signed)
Pt called checking on her refill of percet.  Note stated dr Ermalene Searingbedsole refused to soon to prescribed Pt stated it was not to soon she can take up to 6 a day.  She is completely out of her meds and has been out of her med for 2 days  Please advise when she can pick with up Her rx stated she can take 1-2 tablets by mouth every 8hours as needed  This was filled on 05/10/14 it has been 3 weeks which is a little over 2 pills a day.   Pt is upset and crying that no one has called to tell her it was to soon and she has call repeatly to get an answer  [phone]

## 2014-06-02 NOTE — Telephone Encounter (Signed)
Pt left v/m requesting cb today about status of oxycodone apap rx. Pt is out of medication. Oxycodone apap rx last printed on 05/07/15 for # 60.Please advise.

## 2014-06-03 MED ORDER — OXYCODONE-ACETAMINOPHEN 5-325 MG PO TABS: 1.0000 | ORAL_TABLET | Freq: Three times a day (TID) | ORAL | Status: DC | PRN

## 2014-06-03 NOTE — Telephone Encounter (Signed)
Breeann notified prescription is ready to be picked up at front desk. 

## 2014-06-03 NOTE — Telephone Encounter (Signed)
Spoke with Mallory Stewart.  She states she takes 2 tablets around 8 or 9 o'clock in the evening before bed time.  She usually wakes up early around 4 am and take 1 tablet and on occasion an additional 1/2 tablet.  When she is traveling and is up on her feet a lot she may take 1 to 2 tablets during the day.  She states according to the way the prescription is written 60 tablets would only be a 10 day prescription and doesn't understand why all of a sudden this has become an issue.  She states she is not taking 6 tablets a day.  States we can look at her refill record and see that she is not filling her prescriptions early.

## 2014-06-03 NOTE — Telephone Encounter (Signed)
Last rx given on 12/31. Now okay to refill.  Please send back rx request to me so I can change prescription to be exact for what we think she is taking/ and is actually taking.  I expected 60 to last a month with 2 a day. Clarify this with pt.  I don't know why she was not told of it being to early and rx not filled. Probably message when back to pharmacy. It sounds as if the "little over" 2 per day is what gets our numbers off. She should not be using any where near  6 per day per our last discussion. IN 01/2014 last OV she states she was using 1-2 at bedtime only. Next rx I will write will be to give her 65 tablets ( to get her a few days when she can take 3  Per day. Instructions will be written accordingly (not implying that she can use 6 per day as instructions currently are).  She cannot request refill earlier. If she is using more than last discussion she needs to make appt to discuss amount change.   Let me know if you need me to talk to her.

## 2014-06-03 NOTE — Telephone Encounter (Signed)
Discussed in detail with patient. Will prescribe for her to have ability to take up to 3 a day. Will send in Rx for 90 tabs daily.  Pt expressed her displeasure in not being notified that her Rx was not filled.  PLEASE CALL PT TO LET HE R KNOW RX IS READY FOR PICKUP.

## 2014-06-29 ENCOUNTER — Ambulatory Visit: Payer: 59

## 2014-07-06 ENCOUNTER — Other Ambulatory Visit: Payer: Self-pay

## 2014-07-06 MED ORDER — OXYCODONE-ACETAMINOPHEN 5-325 MG PO TABS: 1.0000 | ORAL_TABLET | Freq: Three times a day (TID) | ORAL | Status: DC | PRN

## 2014-07-06 NOTE — Telephone Encounter (Signed)
Pt left v/m requesting rx oxycodone apap. Call when ready for pick up. Pt last seen 01/08/14; pt request to pick up 07/06/14 or 07/07/14. Pt will be out of med on 07/08/14 and going out of town for family funeral.Please advise.

## 2014-07-06 NOTE — Telephone Encounter (Signed)
Leafy notified prescription is ready to be picked up at the front desk. 

## 2014-07-16 ENCOUNTER — Other Ambulatory Visit: Payer: Self-pay

## 2014-07-16 MED ORDER — LISINOPRIL 20 MG PO TABS: ORAL_TABLET | ORAL | Status: DC

## 2014-07-16 MED ORDER — METFORMIN HCL ER 500 MG PO TB24: ORAL_TABLET | ORAL | Status: DC

## 2014-07-16 NOTE — Telephone Encounter (Signed)
Okay to refill for 90 day supply and schedule when back in town for appt.

## 2014-07-16 NOTE — Telephone Encounter (Signed)
Pt left v/m; pt was seen 01/08/2014 and was to return 6 mth f/u with labs; pt will be out of med next week and pt is going out of town and cannot get appt until later and pt wants to know if could get refills done at mail order and then schedule appt upon pts return or get 1 month at local pharmacy. Please advise.Pt request cb.

## 2014-08-10 ENCOUNTER — Other Ambulatory Visit: Payer: Self-pay | Admitting: *Deleted

## 2014-08-10 MED ORDER — OXYCODONE-ACETAMINOPHEN 5-325 MG PO TABS: 1.0000 | ORAL_TABLET | Freq: Three times a day (TID) | ORAL | Status: DC | PRN

## 2014-08-10 NOTE — Telephone Encounter (Signed)
Patient left a voicemail requesting a refill on her Percocet. Patient stated [place]. Patient stated that she will be out of medication after Wednesday night. Patient stated that she would like to get her husband to pick it up. Patient requested a call back to let her know when the script will be ready for pickup.

## 2014-08-10 NOTE — Telephone Encounter (Signed)
Left message for Mallory Stewart that prescription is ready to be picked up at the front desk.

## 2014-08-27 NOTE — Discharge Summary (Signed)
PATIENT NAME:  Mallory Stewart, Mallory Stewart MR#:  [id number] DATE OF BIRTH:  April 11, 1961  DATE OF ADMISSION:  04/08/2013 DATE OF DISCHARGE:  04/10/2013  DISCHARGE DIAGNOSES: 1.  Acute pancreatitis.  2.  Hypertension.  3.  Diabetes.  4.  Alcohol use, elevated liver enzyme.   CONDITION ON DISCHARGE: Stable.   CODE STATUS: Full code.   MEDICATIONS ON DISCHARGE: 1.  Lisinopril 20 mg once a day.  2.  Metformin 1000 mg oral once a day.  3.  Metformin 500 mg oral once a day at bedtime.  4.  Acetaminophen and oxycodone 1 to 4 times a day as needed for pain.  5.  Metoprolol 25 mg 2 times a day.  6.  Cyclobenzaprine 5 mg oral tablet every six hours as needed for muscle spasms.   HISTORY OF PRESENT ILLNESS:  Done by Dr. Amado CoeGouru  on 04/08/2013. A 54 year old female with past medical history of hypertension, diabetes presented in ER with chief complaint of sudden onset epigastric abdominal pain after lunch since the previous day. It was crampy in nature, no radiation, associated with nausea and intractable vomiting, denied any blood in the vomit. So came to Emergency Room. Her lipase was found to be more than 10,000, AST and ALT were also elevated. Right upper quadrant ultrasound was done, which revealed stenosis and gallstone or wall thickening, so she was admitted with further management of pancreatitis.   HOSPITAL COURSE AND STAY:  1.  Acute pancreatitis. There was no gallstone or blockages. Most likely it was alcohol induced as she was drinking 3 to 4 glasses of wine every day. Lipid panels were elevated. They were coming down slowly. We gave IV fluid, symptomatic management of pancreatitis. She felt better within two days and started tolerating diet, so we discharged her home. The patient also had some complaint of abdominal distention, but which was not very painful,  it was just mild discomfort with that. We counseled due to with acute pancreatitis that she might have some ascites development, but advised to  just continue monitoring it and if it gets bigger or bothering her more,  then she might have to come back; otherwise, it might resolve on its own later on.   2. Hypertension. Lisinopril and metoprolol.  3.  Diabetes. Sliding scale coverage was provided.  4.  Alcohol abuse. We advised her on cutting down alcohol.  5.  Elevated liver enzyme mostly due to her alcohol; were coming down.   IMPORTANT LABORATORY RESULTS IN THE HOSPITAL: Glucose on admission to 37, creatinine 1.05. Lipase was more than 10,000, came down to 3000 on December 4. SGOT was 201 and SGPT 184, came down to 62 and 82 on December 4. White cell count was 7.5, hemoglobin 12.4 on admission and remained stable. Urinalysis is negative.   Ultrasound of abdomen was done, which showed increased hepatic echogenicity likely representing hepatic stenosis, normal in appearance of gallbladder. Upper normal to minimally dilated common duct at 7 mm.   TOTAL TIME SPENT ON THIS DISCHARGE: 40 minutes   ____________________________ Hope PigeonVaibhavkumar G. Elisabeth PigeonVachhani, MD vgv:cc D: 04/12/2013 17:55:30 ET T: 04/13/2013 00:10:41 ET JOB#: [id number]  cc: Hope PigeonVaibhavkumar G. Elisabeth PigeonVachhani, MD, <Dictator> Altamese DillingVAIBHAVKUMAR Tarez Bowns MD ELECTRONICALLY SIGNED 04/14/2013 9:22

## 2014-08-27 NOTE — H&P (Signed)
PATIENT NAME:  Mallory Stewart, Mallory Stewart MR#:  [id number] DATE OF BIRTH:  07/29/1960  DATE OF ADMISSION:  04/08/2013  PRIMARY CARE PHYSICIAN: Dr. Ermalene SearingBedsole.   REFERRING PHYSICIAN: Dr. Zenda AlpersWebster.   CHIEF COMPLAINT: Epigastric abdominal pain with nausea and vomiting.   HISTORY OF PRESENT ILLNESS: The patient is a 54 year old Caucasian female with past medical history of hypertension and diabetes mellitus, is presenting to the ER with a chief complaint of sudden onset of epigastric abdominal pain after lunch since yesterday. It is crampy in nature with no radiation. Associated with nausea and intractable vomiting. Denies any blood in her vomit. Denies any diarrhea or constipation. No similar complaints in the past. Denies any fevers. Came into the ER for severe epigastric abdominal pain and nausea and vomiting. The patient's lipase is elevated at greater than 10,000, and AST and ALT are also elevated. Right upper quadrant ultrasound is done which has revealed steatosis and no gallstones stones or wall thickening. The patient admits that that she drinks wine almost 3 to 4 times per week. Denies any illicit drug usage. The hospitalist team is called to admit the patient.   PAST MEDICAL HISTORY: Diabetes mellitus, hypertension, mild obesity.   PAST SURGICAL HISTORY: Tonsillectomy.   ALLERGIES: No known drug allergies.   PSYCHOSOCIAL HISTORY: Lives at home with husband. Denies any smoking or illicit drug usage. Drinks wine at least 3 to 4 times in a week and she drinks 2 to 3 glasses per day when she drinks.   FAMILY HISTORY: Dad has prostate cancer. Maternal grandmother has breast cancer.   HOME MEDICATIONS: Metformin 500 mg once a day, lisinopril 20 mg once daily.   REVIEW OF SYSTEMS:  CONSTITUTIONAL: Denies any fever, fatigue.  EYES: Denies blurry vision, double vision.  ENT: Denies epistaxis, discharge.  RESPIRATION: Denies cough, COPD.   CARDIOVASCULAR: No chest pain, palpitations or syncope.   GASTROINTESTINAL: Has nausea and vomiting. Denies diarrhea or constipation. Has epigastric abdominal pain.  NEUROLOGIC: Denies any vertigo, ataxia.  PSYCHIATRIC: No ADD, OCD.  MUSCULOSKELETAL: No joint pain in the neck and back. Denies any arthritis.  SKIN: No rashes.  PSYCHIATRIC: Denies any anxiety or depression.   PHYSICAL EXAMINATION:  VITAL SIGNS: Temperature 98.5, pulse 106, respirations 18, blood pressure 148/71, pulse ox 94% on room air.  GENERAL APPEARANCE: Not in any acute distress. Moderately built and obese.  HEENT: Normocephalic, atraumatic. Pupils are equally reacting to light and accommodation. No scleral icterus. No conjunctival injection. No sinus tenderness. Dry mucous membranes. Oral cavity is intact.  NECK: Supple. No JVD. No thyromegaly. No carotid bruits. Range of motion is intact.  LUNGS: Clear to auscultation bilaterally. No accessory muscle usage. No anterior chest wall tenderness on palpation.  CARDIAC: S1, S2 normal. Regular rate and rhythm, tachycardic.  GASTROINTESTINAL: Soft. Positive epigastric tenderness. No rebound tenderness. Bowel sounds are positive in all 4 quadrants. No hepatosplenomegaly. No masses felt.  NEUROLOGIC: Awake, alert and oriented x 3. Motor and sensory grossly intact. Reflexes are 2+. Cranial nerves II through XII are grossly intact.  EXTREMITIES: No edema. No cyanosis. No clubbing  SKIN: Warm to touch. Normal turgor. No rashes. No lesions.  MUSCULOSKELETAL: No joint effusion, tenderness, erythema. Peripheral pulses, dorsalis pedis and posterior tibialis are 2+.  PSYCHIATRIC: Normal mood and affect.   LABORATORIES AND IMAGING STUDIES: Ultrasound of the abdomen has revealed no gallstones or wall thickening visualized. No sonographic Murphy's sign noticed. Common bile duct diameter upper normal to minimally dilated at 7 mm. Top normal in  this age group is 6 mm. No obstructive stone or intrahepatic ductal dilatation. Liver with increased  echogenicity. Correlate with bilirubin levels and if they are elevated, consider CT or MRCP. LFTs: AST and ALT are elevated at 200's and 184. The rest of the LFTs are normal. Troponin less than 0.02. CBC normal. Urinalysis 1+ ketones, leuk esterase and nitrites are negative. BMP: Glucose 237, BUN 6, creatinine 1.05, potassium 4.6, chloride 101, CO2 14. GFR greater than 60. Anion gap 20. Serum osmolality 275. Calcium 9.0. Lipase greater than 10,000. EKG: Sinus tachycardia with nonspecific T wave abnormality.   ASSESSMENT AND PLAN: A 54 year old Caucasian female presenting to the Emergency Room with a chief complaint of sudden onset of epigastric abdominal pain associated with nausea and vomiting. Will be admitted with the following assessment and plan:  1. Acute pancreatitis: Etiology needs to be determined. Will keep her n.p.o. Provide intravenous fluids pain. Pain management with morphine. Will provide Zofran as needed for nausea. Will repeat LFTs and lipase in the a.m. Will provide her intravenous Protonix. P.o. ice chips as needed for dry mouth. If necessary, will consider MRCP if bilirubin levels are up trending. Check fasting lipid panel in a.m.  2. Diabetes mellitus: The patient being n.p.o. will put her on sliding scale insulin.  3. Hypertension: Blood pressure is uncontrolled which could be from the uncontrolled pain. Will continue close monitoring and provide her Lopressor intravenous on as needed basis for elevated blood pressure.  4. Will provide her alcohol cessation counseling once the patient is clinically more stable.  5. Will provide gastrointestinal prophylaxis and deep vein thrombosis prophylaxis.   Diagnosis and plan of care was discussed in detail with the patient. She is aware of the plan. She is FULL CODE. Husband is the medical power of attorney.   TOTAL TIME SPENT ON ADMISSION: 45 minutes.    ____________________________ Ramonita Lab, MD ag:gb D: 04/08/2013 04:42:04  ET T: 04/08/2013 05:16:42 ET JOB#: [id number]  cc: Ramonita Lab, MD, <Dictator> Excell Seltzer, MD Ramonita Lab MD ELECTRONICALLY SIGNED 04/08/2013 7:31

## 2014-09-13 ENCOUNTER — Other Ambulatory Visit: Payer: Self-pay

## 2014-09-13 NOTE — Telephone Encounter (Signed)
Pt left v/m requesting rx oxycodone apap. Call when ready for pick up. Pt last seen 01/08/2014; rx last printed 08/10/2014; pt will be out of med on 09/15/14.Please advise.

## 2014-09-14 MED ORDER — OXYCODONE-ACETAMINOPHEN 5-325 MG PO TABS: 1.0000 | ORAL_TABLET | Freq: Three times a day (TID) | ORAL | Status: DC | PRN

## 2014-09-14 NOTE — Telephone Encounter (Signed)
Jazma notified prescription is ready to be picked up at the front desk. 

## 2014-09-15 ENCOUNTER — Other Ambulatory Visit: Payer: Self-pay | Admitting: Family Medicine

## 2014-09-15 ENCOUNTER — Encounter: Payer: Self-pay | Admitting: Family Medicine

## 2014-10-08 ENCOUNTER — Encounter: Payer: Self-pay | Admitting: Family Medicine

## 2014-10-11 ENCOUNTER — Telehealth: Payer: Self-pay | Admitting: Family Medicine

## 2014-10-11 DIAGNOSIS — E119 Type 2 diabetes mellitus without complications: Secondary | ICD-10-CM

## 2014-10-11 DIAGNOSIS — E7800 Pure hypercholesterolemia, unspecified: Secondary | ICD-10-CM

## 2014-10-11 DIAGNOSIS — E538 Deficiency of other specified B group vitamins: Secondary | ICD-10-CM

## 2014-10-11 NOTE — Telephone Encounter (Signed)
-----   Message from Alvina Chouerri J Walsh sent at 10/11/2014 12:19 PM EDT ----- Regarding: Lab orders for Tuesday, 6.4.16 Lab orders for a f/u

## 2014-10-19 ENCOUNTER — Other Ambulatory Visit (INDEPENDENT_AMBULATORY_CARE_PROVIDER_SITE_OTHER): Payer: 59

## 2014-10-19 ENCOUNTER — Other Ambulatory Visit: Payer: Self-pay

## 2014-10-19 DIAGNOSIS — E119 Type 2 diabetes mellitus without complications: Secondary | ICD-10-CM | POA: Diagnosis not present

## 2014-10-19 DIAGNOSIS — E7800 Pure hypercholesterolemia, unspecified: Secondary | ICD-10-CM

## 2014-10-19 DIAGNOSIS — E538 Deficiency of other specified B group vitamins: Secondary | ICD-10-CM | POA: Diagnosis not present

## 2014-10-19 LAB — COMPREHENSIVE METABOLIC PANEL
ALBUMIN: 4.5 g/dL (ref 3.5–5.2)
ALK PHOS: 50 U/L (ref 39–117)
ALT: 52 U/L — AB (ref 0–35)
AST: 45 U/L — AB (ref 0–37)
BUN: 13 mg/dL (ref 6–23)
CO2: 28 mEq/L (ref 19–32)
CREATININE: 0.8 mg/dL (ref 0.40–1.20)
Calcium: 9.8 mg/dL (ref 8.4–10.5)
Chloride: 101 mEq/L (ref 96–112)
GFR: 80.14 mL/min (ref >60.00)
Glucose, Bld: 131 mg/dL — ABNORMAL HIGH (ref 70–99)
Potassium: 4.7 mEq/L (ref 3.5–5.1)
Sodium: 138 mEq/L (ref 135–145)
Total Bilirubin: 0.8 mg/dL (ref 0.2–1.2)
Total Protein: 6.9 g/dL (ref 6.0–8.3)

## 2014-10-19 LAB — HEMOGLOBIN A1C: HEMOGLOBIN A1C: 5.4 % (ref 4.6–6.5)

## 2014-10-19 LAB — LIPID PANEL
CHOLESTEROL: 238 mg/dL — AB (ref 0–200)
HDL: 80.8 mg/dL (ref >39.00)
LDL Cholesterol: 132 mg/dL — ABNORMAL HIGH (ref 0–99)
NonHDL: 157.2
TRIGLYCERIDES: 125 mg/dL (ref 0.0–149.0)
Total CHOL/HDL Ratio: 3
VLDL: 25 mg/dL (ref 0.0–40.0)

## 2014-10-19 LAB — VITAMIN B12: Vitamin B-12: 177 pg/mL — ABNORMAL LOW (ref 211–911)

## 2014-10-19 MED ORDER — OXYCODONE-ACETAMINOPHEN 5-325 MG PO TABS: 1.0000 | ORAL_TABLET | Freq: Three times a day (TID) | ORAL | Status: DC | PRN

## 2014-10-19 NOTE — Telephone Encounter (Signed)
Left message for Mallory Stewart that her prescription is ready to be picked up at the front desk.

## 2014-10-19 NOTE — Telephone Encounter (Signed)
Pt left note requesting rx oxycodone apap. Call when ready for pick up.last seen 01/08/2014 and rx last printed #90 on 09/14/14.

## 2014-10-21 ENCOUNTER — Ambulatory Visit (INDEPENDENT_AMBULATORY_CARE_PROVIDER_SITE_OTHER): Payer: 59 | Admitting: Family Medicine

## 2014-10-21 ENCOUNTER — Encounter: Payer: Self-pay | Admitting: Family Medicine

## 2014-10-21 VITALS — BP 150/90 | HR 98 | Temp 97.5°F | Ht 70.0 in | Wt 189.0 lb

## 2014-10-21 DIAGNOSIS — E119 Type 2 diabetes mellitus without complications: Secondary | ICD-10-CM

## 2014-10-21 DIAGNOSIS — G5793 Unspecified mononeuropathy of bilateral lower limbs: Secondary | ICD-10-CM

## 2014-10-21 DIAGNOSIS — I10 Essential (primary) hypertension: Secondary | ICD-10-CM | POA: Diagnosis not present

## 2014-10-21 DIAGNOSIS — E538 Deficiency of other specified B group vitamins: Secondary | ICD-10-CM | POA: Diagnosis not present

## 2014-10-21 DIAGNOSIS — E7800 Pure hypercholesterolemia, unspecified: Secondary | ICD-10-CM

## 2014-10-21 DIAGNOSIS — G5791 Unspecified mononeuropathy of right lower limb: Secondary | ICD-10-CM

## 2014-10-21 DIAGNOSIS — G5792 Unspecified mononeuropathy of left lower limb: Secondary | ICD-10-CM

## 2014-10-21 LAB — HM DIABETES FOOT EXAM

## 2014-10-21 MED ORDER — METFORMIN HCL ER 500 MG PO TB24: ORAL_TABLET | ORAL | Status: DC

## 2014-10-21 MED ORDER — LISINOPRIL 20 MG PO TABS: ORAL_TABLET | ORAL | Status: DC

## 2014-10-21 MED ORDER — CYANOCOBALAMIN 1000 MCG/ML IJ SOLN
1000.0000 ug | Freq: Once | INTRAMUSCULAR | Status: AC
  Administered 2014-10-21: 1000 ug via INTRAMUSCULAR

## 2014-10-21 NOTE — Assessment & Plan Note (Signed)
No improvement. Continue percocet as needed.

## 2014-10-21 NOTE — Assessment & Plan Note (Signed)
B12 inj given today. Then start oral. Recheck in 3 months.

## 2014-10-21 NOTE — Progress Notes (Signed)
Pre visit review using our clinic review tool, if applicable. No additional management support is needed unless otherwise documented below in the visit note. 

## 2014-10-21 NOTE — Assessment & Plan Note (Addendum)
Not at goal < 100 with DM. Work on lifestyle changes aggressively. Will be able to start swimming. Recheck in 3 months.

## 2014-10-21 NOTE — Patient Instructions (Addendum)
Return in 3 months for lab check of cholesterol only. May need to consider welchol. Given B12 today. Start daily B12 1000 mcg.  Apply topical steroid OTC twice daily to right arm lesion. If not improved.. Return for cryotherapy for actinic keratosis.

## 2014-10-21 NOTE — Assessment & Plan Note (Signed)
Well controlled. Continue current medication.  

## 2014-10-21 NOTE — Addendum Note (Signed)
Addended by: Sueanne Margarita on: 10/21/2014 12:23 PM   Modules accepted: Orders

## 2014-10-21 NOTE — Progress Notes (Signed)
Mallory Stewart is a 54 year old right-handed woman with history of pancreatitis ( 04/2013), transaminitis, type II diabetes mellitus, hypercholesterolemia, hypertension as well as sudden onset numbness in setting of vitamin B12 deficiency and diabetes.   She comes to clinic today to follow up DM and numbness.  Diabetes: Well controlled on metformin.  Lab Results  Component Value Date   HGBA1C 5.4 10/19/2014  Using medications without difficulties:None Hypoglycemic episodes:None Hyperglycemic episodes:None Feet problems:no ulcers Blood Sugars averaging: FBS 90-103 today 140 ( higher given she is sick with cold, skipped dose metformin) eye exam within last year: done  Body mass index is 27.12 kg/(m^2).  Recent history includes :  B12 was low and she was started on injections > 6 months ago. Had  EMG which was normal. Unremarkable MRI thoracic spine and brain  MRI Cervical spine w/wo (05/18/13): no cord signal abnormalities. Right foraminal and lateral recess impingement of C5 and C6 nerve roots.  Previously followed at [facility] Dr. Ezequiel Essex. Multiple nerve conduction inconclusive, nerves looked good, but no reflexes. She has had no improvement at all, just worsening with b12.  small nerve test.  Using percocet for pain. ( 1-2 at night)  She has started seeing integrative therapy. Some relief for infra red treatments and cranioscaral therapy. Planning acupunture.  Hypertension:   Elevated today on lisinopril 20 mg dialy BP Readings from Last 3 Encounters:  10/21/14 150/90  01/08/14 169/104  07/21/13 148/84  Using medication without problems or light Diet compliance:moderate Exercise:limited Other complaints:  Elevated Cholesterol:  LDL almost at goal  < 100.  Elevated LFTs to statins in past.   Plan welchol if not at goal in 3 months. Lab Results  Component Value Date   CHOL 238* 10/19/2014   HDL 80.80 10/19/2014   LDLCALC 132* 10/19/2014   LDLDIRECT 160.5  02/20/2012   TRIG 125.0 10/19/2014   CHOLHDL 3 10/19/2014  Using medications without problems: Muscle aches:  Diet compliance: Good Exercise: none, starting to swim now. Other complaints:  LFTs from fatty liver improved.   B12 low again, last  B12 injection in 05/2014  (was in nml range when on monthly)    Review of Systems  Constitutional: Negative for fever and fatigue.  HENT: Negative for ear pain.  Eyes: Negative for pain.  Respiratory: Negative for chest tightness and shortness of breath.  Cardiovascular: Negative for chest pain, palpitations and leg swelling.  Gastrointestinal: Negative for abdominal pain.  Genitourinary: Negative for dysuria.       Objective:   Physical Exam  Constitutional: She is oriented to person, place, and time. Vital signs are normal. She appears well-developed and well-nourished. She is cooperative. Non-toxic appearance. She does not appear ill. No distress.  HENT:  Head: Normocephalic.  Right Ear: Hearing, tympanic membrane, external ear and ear canal normal. Tympanic membrane is not erythematous, not retracted and not bulging.  Left Ear: Hearing, tympanic membrane, external ear and ear canal normal. Tympanic membrane is not erythematous, not retracted and not bulging.  Nose: No mucosal edema or rhinorrhea. Right sinus exhibits no maxillary sinus tenderness and no frontal sinus tenderness. Left sinus exhibits no maxillary sinus tenderness and no frontal sinus tenderness.  Mouth/Throat: Uvula is midline, oropharynx is clear and moist and mucous membranes are normal.  Eyes: Conjunctivae, EOM and lids are normal. Pupils are equal, round, and reactive to light. Lids are everted and swept, no foreign bodies found.  Neck: Trachea normal and normal range of motion. Neck supple. Carotid  bruit is not present. No mass and no thyromegaly present.  Cardiovascular: Normal rate, regular rhythm, S1 normal, S2 normal, normal heart sounds, intact distal  pulses and normal pulses. Exam reveals no gallop and no friction rub.  No murmur heard. Pulmonary/Chest: Effort normal and breath sounds normal. Not tachypneic. No respiratory distress. She has no decreased breath sounds. She has no wheezes. She has no rhonchi. She has no rales.  Abdominal: Soft. Normal appearance and bowel sounds are normal. There is no tenderness.  Neurological: She is alert and oriented to person, place, and time. She has normal strength. A sensory deficit is present. No cranial nerve deficit. She displays a negative Romberg sign. Gait normal.  No sensation to monofilament in B feet and legs Skin: Skin is warm, dry and intact. No rash noted.  Psychiatric: Her speech is normal and behavior is normal. Judgment and thought content normal. Her mood appears not anxious. Cognition and memory are normal. She does not exhibit a depressed mood.   Diabetic foot exam: Normal inspection No skin breakdown No calluses  Normal DP pulses Abnormal sensation to light touch and monofilament Nails normal

## 2014-12-02 ENCOUNTER — Other Ambulatory Visit: Payer: Self-pay

## 2014-12-02 MED ORDER — OXYCODONE-ACETAMINOPHEN 5-325 MG PO TABS: 1.0000 | ORAL_TABLET | Freq: Three times a day (TID) | ORAL | Status: DC

## 2014-12-02 NOTE — Telephone Encounter (Signed)
Pt left v/m requesting refill for oxycodone. Call when ready for pick up.pt last seen 10/21/14. ? The quantity on rx.Please advise.

## 2014-12-03 NOTE — Telephone Encounter (Signed)
Jennifier notified prescription is ready to be picked up at the front desk. 

## 2015-01-12 ENCOUNTER — Other Ambulatory Visit: Payer: Self-pay

## 2015-01-12 NOTE — Telephone Encounter (Signed)
pt left v/m requesting rx oxycodone apap. Call when ready for pick up. rx last printed # 90 on 12/02/14 and pt last seen 10/21/14.

## 2015-01-13 MED ORDER — OXYCODONE-ACETAMINOPHEN 5-325 MG PO TABS: 1.0000 | ORAL_TABLET | Freq: Three times a day (TID) | ORAL | Status: DC

## 2015-01-13 NOTE — Telephone Encounter (Signed)
[name] notified prescription is ready to be picked up at the front desk. 

## 2015-01-21 ENCOUNTER — Other Ambulatory Visit: Payer: 59

## 2015-02-10 ENCOUNTER — Other Ambulatory Visit: Payer: Self-pay | Admitting: Family Medicine

## 2015-02-14 ENCOUNTER — Other Ambulatory Visit: Payer: Self-pay | Admitting: Family Medicine

## 2015-02-14 NOTE — Telephone Encounter (Signed)
pt left v/m requesting rx oxycodone apap. Call when ready for pick up. rx last printed # 90 on 01/13/15 and pt last seen 10/21/14.  Pt states she is going out of town and wants to pick up tomorrow 02/15/15.  Best [phone]

## 2015-02-15 MED ORDER — OXYCODONE-ACETAMINOPHEN 5-325 MG PO TABS: 1.0000 | ORAL_TABLET | Freq: Three times a day (TID) | ORAL | Status: DC

## 2015-02-15 NOTE — Telephone Encounter (Signed)
Spoke with patient and advised rx ready for pick-up and it will be at the front desk.  

## 2015-02-15 NOTE — Telephone Encounter (Signed)
Send to me in refill request format if not already done so.

## 2015-03-23 ENCOUNTER — Other Ambulatory Visit: Payer: Self-pay

## 2015-03-23 NOTE — Telephone Encounter (Signed)
Pt left v/m requesting rx oxycodone apap. Call when ready for pick up; last printed # 90 on 02/15/15; pt last seen 10/21/14.

## 2015-03-24 MED ORDER — OXYCODONE-ACETAMINOPHEN 5-325 MG PO TABS: 1.0000 | ORAL_TABLET | Freq: Three times a day (TID) | ORAL | Status: DC

## 2015-03-24 NOTE — Telephone Encounter (Signed)
Mallory JohnsKathleen notified prescription is ready to be picked up at the front desk.

## 2015-04-18 ENCOUNTER — Other Ambulatory Visit: Payer: 59

## 2015-04-22 ENCOUNTER — Encounter: Payer: 59 | Admitting: Family Medicine

## 2015-04-25 ENCOUNTER — Other Ambulatory Visit: Payer: Self-pay

## 2015-04-25 NOTE — Telephone Encounter (Signed)
Pt left v/m requesting rx oxycodone apap. Call when ready for pick up. rx last printed # 90 on 03/24/15. Last seen 10/21/14.

## 2015-04-26 MED ORDER — OXYCODONE-ACETAMINOPHEN 5-325 MG PO TABS: 1.0000 | ORAL_TABLET | Freq: Three times a day (TID) | ORAL | Status: DC

## 2015-04-26 NOTE — Telephone Encounter (Signed)
Left message for Jae DireKate that her prescription is ready to be picked up at the front desk.

## 2015-05-30 ENCOUNTER — Other Ambulatory Visit: Payer: Self-pay

## 2015-05-30 NOTE — Telephone Encounter (Signed)
Pt left v/m requesting rx oxycodone. Call when ready for pick up. Last printed # 90 on 04/26/15; last seen 10/21/14.

## 2015-05-31 MED ORDER — OXYCODONE-ACETAMINOPHEN 5-325 MG PO TABS: 1.0000 | ORAL_TABLET | Freq: Three times a day (TID) | ORAL | Status: DC

## 2015-05-31 NOTE — Telephone Encounter (Signed)
Mallory Stewart notified prescription is ready to be picked up at the front desk.

## 2015-07-05 ENCOUNTER — Other Ambulatory Visit: Payer: Self-pay

## 2015-07-05 MED ORDER — OXYCODONE-ACETAMINOPHEN 5-325 MG PO TABS: 1.0000 | ORAL_TABLET | Freq: Three times a day (TID) | ORAL | Status: DC

## 2015-07-05 NOTE — Telephone Encounter (Signed)
Pt left v/m requesting rx oxycodone apap. Call when ready for pick up. Last printed # 90 on 05/31/15; pt last seen 10/21/14.

## 2015-07-05 NOTE — Telephone Encounter (Signed)
Declyn notified that her prescription is ready to be picked up at the front desk.

## 2015-08-10 ENCOUNTER — Other Ambulatory Visit: Payer: Self-pay

## 2015-08-10 ENCOUNTER — Other Ambulatory Visit: Payer: Self-pay | Admitting: Family Medicine

## 2015-08-10 NOTE — Telephone Encounter (Signed)
Pt left v/m requesting rx oxycodone apap. Call when ready for pick up; pt request to pick up on 08/12/15 because pt will be out of town next week. Last printed # 90 on 07/05/15; last seen 10/21/14.

## 2015-08-11 MED ORDER — OXYCODONE-ACETAMINOPHEN 5-325 MG PO TABS: 1.0000 | ORAL_TABLET | Freq: Three times a day (TID) | ORAL | Status: DC

## 2015-08-11 NOTE — Telephone Encounter (Signed)
Danyelle notified prescription is ready to be picked up at the front desk. 

## 2015-09-02 ENCOUNTER — Other Ambulatory Visit: Payer: 59

## 2015-09-09 ENCOUNTER — Encounter: Payer: 59 | Admitting: Family Medicine

## 2015-09-16 ENCOUNTER — Other Ambulatory Visit: Payer: Self-pay

## 2015-09-16 MED ORDER — OXYCODONE-ACETAMINOPHEN 5-325 MG PO TABS: 1.0000 | ORAL_TABLET | Freq: Three times a day (TID) | ORAL | Status: DC

## 2015-09-16 NOTE — Telephone Encounter (Signed)
Pt left v/m requesting rx oxycodone apap. Call when ready for pick up. Last printed # 90 on 08/11/15. Last seen 10/21/14 and has cpx scheduled on 09/22/15.

## 2015-09-16 NOTE — Telephone Encounter (Signed)
Mallory Stewart notified prescription is ready to be picked up at the front desk.

## 2015-09-18 ENCOUNTER — Telehealth: Payer: Self-pay | Admitting: Family Medicine

## 2015-09-18 DIAGNOSIS — E7800 Pure hypercholesterolemia, unspecified: Secondary | ICD-10-CM

## 2015-09-18 DIAGNOSIS — E119 Type 2 diabetes mellitus without complications: Secondary | ICD-10-CM

## 2015-09-18 NOTE — Telephone Encounter (Signed)
-----   Message from Natasha C Chavers sent at 09/12/2015  2:02 PM EDT ----- Regarding: Cpx labs Mon 5/15, need orders. Thanks! :-) Please order  future cpx labs for pt's upcoming lab appt. Thanks Tasha  

## 2015-09-19 ENCOUNTER — Other Ambulatory Visit: Payer: 59

## 2015-09-20 ENCOUNTER — Encounter: Payer: Self-pay | Admitting: *Deleted

## 2015-09-20 ENCOUNTER — Other Ambulatory Visit (INDEPENDENT_AMBULATORY_CARE_PROVIDER_SITE_OTHER): Payer: 59

## 2015-09-20 DIAGNOSIS — E119 Type 2 diabetes mellitus without complications: Secondary | ICD-10-CM | POA: Diagnosis not present

## 2015-09-20 DIAGNOSIS — E7800 Pure hypercholesterolemia, unspecified: Secondary | ICD-10-CM

## 2015-09-20 DIAGNOSIS — E538 Deficiency of other specified B group vitamins: Secondary | ICD-10-CM

## 2015-09-20 LAB — LIPID PANEL
CHOLESTEROL: 257 mg/dL — AB (ref 0–200)
HDL: 73.7 mg/dL (ref >39.00)
LDL CALC: 158 mg/dL — AB (ref 0–99)
NonHDL: 183.33
Total CHOL/HDL Ratio: 3
Triglycerides: 129 mg/dL (ref 0.0–149.0)
VLDL: 25.8 mg/dL (ref 0.0–40.0)

## 2015-09-20 LAB — COMPREHENSIVE METABOLIC PANEL
ALBUMIN: 4.4 g/dL (ref 3.5–5.2)
ALT: 41 U/L — AB (ref 0–35)
AST: 45 U/L — AB (ref 0–37)
Alkaline Phosphatase: 44 U/L (ref 39–117)
BILIRUBIN TOTAL: 0.6 mg/dL (ref 0.2–1.2)
BUN: 12 mg/dL (ref 6–23)
CALCIUM: 9.5 mg/dL (ref 8.4–10.5)
CO2: 26 meq/L (ref 19–32)
CREATININE: 0.73 mg/dL (ref 0.40–1.20)
Chloride: 100 mEq/L (ref 96–112)
GFR: 88.75 mL/min (ref >60.00)
Glucose, Bld: 109 mg/dL — ABNORMAL HIGH (ref 70–99)
Potassium: 4.6 mEq/L (ref 3.5–5.1)
Sodium: 138 mEq/L (ref 135–145)
TOTAL PROTEIN: 6.7 g/dL (ref 6.0–8.3)

## 2015-09-20 LAB — HEMOGLOBIN A1C: HEMOGLOBIN A1C: 5.9 % (ref 4.6–6.5)

## 2015-09-20 LAB — VITAMIN B12: Vitamin B-12: 760 pg/mL (ref 211–911)

## 2015-09-22 ENCOUNTER — Other Ambulatory Visit (HOSPITAL_COMMUNITY)
Admission: RE | Admit: 2015-09-22 | Discharge: 2015-09-22 | Disposition: A | Discharge status: Home / Self Care | Payer: 59 | Source: Ambulatory Visit | Attending: Family Medicine | Admitting: Family Medicine

## 2015-09-22 ENCOUNTER — Encounter: Payer: Self-pay | Admitting: Family Medicine

## 2015-09-22 ENCOUNTER — Ambulatory Visit (INDEPENDENT_AMBULATORY_CARE_PROVIDER_SITE_OTHER): Payer: 59 | Admitting: Family Medicine

## 2015-09-22 VITALS — BP 158/110 | HR 98 | Temp 98.5°F | Ht 68.5 in | Wt 191.8 lb

## 2015-09-22 DIAGNOSIS — Z23 Encounter for immunization: Secondary | ICD-10-CM | POA: Diagnosis not present

## 2015-09-22 DIAGNOSIS — E7800 Pure hypercholesterolemia, unspecified: Secondary | ICD-10-CM | POA: Diagnosis not present

## 2015-09-22 DIAGNOSIS — G5791 Unspecified mononeuropathy of right lower limb: Secondary | ICD-10-CM | POA: Diagnosis not present

## 2015-09-22 DIAGNOSIS — Z01419 Encounter for gynecological examination (general) (routine) without abnormal findings: Secondary | ICD-10-CM | POA: Insufficient documentation

## 2015-09-22 DIAGNOSIS — I10 Essential (primary) hypertension: Secondary | ICD-10-CM

## 2015-09-22 DIAGNOSIS — G5792 Unspecified mononeuropathy of left lower limb: Secondary | ICD-10-CM

## 2015-09-22 DIAGNOSIS — G5793 Unspecified mononeuropathy of bilateral lower limbs: Secondary | ICD-10-CM

## 2015-09-22 DIAGNOSIS — Z1151 Encounter for screening for human papillomavirus (HPV): Secondary | ICD-10-CM | POA: Diagnosis present

## 2015-09-22 DIAGNOSIS — Z0000 Encounter for general adult medical examination without abnormal findings: Secondary | ICD-10-CM | POA: Diagnosis not present

## 2015-09-22 DIAGNOSIS — Z124 Encounter for screening for malignant neoplasm of cervix: Secondary | ICD-10-CM

## 2015-09-22 DIAGNOSIS — E119 Type 2 diabetes mellitus without complications: Secondary | ICD-10-CM

## 2015-09-22 MED ORDER — GABAPENTIN 100 MG PO CAPS: ORAL_CAPSULE | ORAL | Status: DC

## 2015-09-22 NOTE — Progress Notes (Signed)
55 year old female presents for wellness exam. She also has the following chronic issues.  Chronic neuropathic leg pain: On  Oxycodone at night,  occ uses additional tabs. She has had extensive work up by neuro for causes of neuropathic pain. B12 was low and she was started on injections > 6 months ago. Had EMG which was normal. Unremarkable MRI thoracic spine and brain  MRI Cervical spine w/wo (05/18/13): no cord signal abnormalities. Right foraminal and lateral recess impingement of C5 and C6 nerve roots. [facility] Ezequiel EssexGable. Multiple nerve conduction inconclusive, nerves looked good, but no reflexes. She has had no improvement at all, just worsening with b12.  small nerve test.  tried integrative therapy. Only minimal relief for infra red treatments and cranioscaral therapy. Minimal improvement with acupunture.  She was seen at pain clinic. He pain is getting worse. Mainly at night She never tried a neuropathic pain med.  Anxiety, generalized: resolved.  Hypertension: Above goal on lisinopril but she has white coat HTN BP Readings from Last 3 Encounters:  09/22/15 158/110  10/21/14 150/90  01/08/14 169/104  Using medication without problems or lightheadedness: None Chest pain with exertion:None Edema:None Short of breath:see above Average home BPs: At 147/85 Other issues:  Elevated Cholesterol: Poor control on no med. Lab Results  Component Value Date   CHOL 257* 09/20/2015   HDL 73.70 09/20/2015   LDLCALC 158* 09/20/2015   LDLDIRECT 160.5 02/20/2012   TRIG 129.0 09/20/2015   CHOLHDL 3 09/20/2015  Using medications without problems: Off med given elevated LFTs Muscle aches:  Diet compliance: great Exercise: increasing walking.. Daily in last week. Other complaints:  Diabetes: At goal on metformin. Lab Results  Component Value Date   HGBA1C 5.9 09/20/2015  Using medications without difficulties: Makes her nauseous in  AMs Hypoglycemic episodes: None Hyperglycemic episodes:None Feet problems: none Blood Sugars averaging: 98-110   Liver function tests elevated.... Likely fatty liver as seen on US. Stable. No improvement off statin.Marland Kitchen. Has decreased alcohol intake. 2 drinks a day 1- 4 days a week.  Social History /Family History/Past Medical History reviewed and updated if needed.  Review of Systems  Constitutional: Positive for fever and fatigue.  HENT: Positive for congestion. Negative for ear pain.  Eyes: Negative for pain.  Respiratory: Negative for chest tightness and shortness of breath.  Cardiovascular: Negative for chest pain, palpitations and leg swelling.  Gastrointestinal: Negative for abdominal pain.  Genitourinary: Negative for dysuria.       Objective:   Physical Exam  Constitutional: Vital signs are normal. She appears well-developed and well-nourished. She is cooperative. Non-toxic appearance. She does not appear ill. No distress.  HENT:  Head: Normocephalic.  Right Ear: Hearing, tympanic membrane, external ear and ear canal normal. Tympanic membrane is not erythematous, not retracted and not bulging.  Left Ear: Hearing, tympanic membrane, external ear and ear canal normal. Tympanic membrane is not erythematous, not retracted and not bulging.  Nose: Nose normal. No mucosal edema or rhinorrhea. Right sinus exhibits no maxillary sinus tenderness and no frontal sinus tenderness. Left sinus exhibits no maxillary sinus tenderness and no frontal sinus tenderness.  Mouth/Throat: Uvula is midline, oropharynx is clear and moist and mucous membranes are normal.  Eyes: Conjunctivae normal, EOM and lids are normal. Pupils are equal, round, and reactive to light. No foreign bodies found.  Neck: Trachea normal and normal range of motion. Neck supple. Carotid bruit is not present. No mass and no thyromegaly present.  Cardiovascular:  Normal rate, regular rhythm, S1 normal, S2 normal, normal  heart sounds, intact distal pulses and normal pulses. Exam reveals no gallop and no friction rub.  No murmur heard. Pulmonary/Chest: Effort normal and breath sounds normal. Not tachypneic. No respiratory distress. She has no decreased breath sounds. She has no wheezes. She has no rhonchi. She has no rales.  Abdominal: Soft. Normal appearance and bowel sounds are normal. She exhibits no distension, no fluid wave, no abdominal bruit and no mass. There is no hepatosplenomegaly. There is no tenderness. There is no rebound, no guarding and no CVA tenderness. No hernia.  Genitourinary: Vagina normal and uterus normal. No breast swelling, tenderness, discharge or bleeding. Pelvic exam was performed with patient prone. There is no rash, tenderness or lesion on the right labia. There is no rash, tenderness or lesion on the left labia. Uterus is not enlarged and not tender. Cervix exhibits no motion tenderness, no discharge and no friability. Right adnexum displays no mass, no tenderness and no fullness. Left adnexum displays no mass, no tenderness and no fullness.  Lymphadenopathy:   She has no cervical adenopathy.   She has no axillary adenopathy.  Neurological: She is alert. She has normal strength. No cranial nerve deficit or sensory deficit.  Skin: Skin is warm, dry and intact. No rash noted.  Psychiatric: Her speech is normal and behavior is normal. Judgment and thought content normal. Her mood appears not anxious. Cognition and memory are normal. She does not exhibit a depressed mood.          Assessment & Plan:  The patient's preventative maintenance and recommended screening tests for an annual wellness exam were reviewed in full today. Brought up to date unless services declined.  Counselled on the importance of diet, exercise, and its role in overall health and mortality. The patient's FH and SH was reviewed, including their home life, tobacco status, and drug and alcohol status.    Vaccines: uptodate with Td, given  PCV23 today Mammo: scheduled PAP/DVE:last pap nml in 2014, due for repeat and DVE.  No menses at this point. Colon: looking into cologuard or hemeoccult.  Nonsmoker  Hep C: done  HIV; refused

## 2015-09-22 NOTE — Patient Instructions (Addendum)
Follow BP at home, call if it is consistently > 140/90. Start gabapentin at night, titrate up to 300 mg as needed. Work on low cholesterol diet, increase exercise as able. Let us know what you chose for colon cancer screening.

## 2015-09-26 LAB — CYTOLOGY - PAP

## 2015-09-27 ENCOUNTER — Encounter: Payer: Self-pay | Admitting: *Deleted

## 2015-09-27 NOTE — Assessment & Plan Note (Signed)
Inadequate control, pt hesitant to restart med. Work on low chol diet,info given. Re-eval in 3 months.

## 2015-09-27 NOTE — Assessment & Plan Note (Signed)
Above goal in office today, pt with hx  Of white coat HTN. Follow BP at home , call with meaurements

## 2015-09-27 NOTE — Assessment & Plan Note (Signed)
At goal on current meds. Encouraged exercise, weight loss, healthy eating habits.

## 2015-09-27 NOTE — Assessment & Plan Note (Signed)
Start gabapentin at night, titrate up to 300 mg as needed.

## 2015-10-04 ENCOUNTER — Telehealth: Payer: Self-pay

## 2015-10-04 NOTE — Telephone Encounter (Signed)
Pt called the gabapentin 100 mg taking 2 daily is helping the pain and pt wants to verify nothing needs to be done at pharmacy. [facility] [place] and she advised the way rx is written nothing further needed at this time. Pt voiced understanding.

## 2015-10-19 ENCOUNTER — Telehealth: Payer: Self-pay

## 2015-10-19 NOTE — Telephone Encounter (Signed)
Pt left v/m requesting new rx for gabapentin 300 mg sent to [facility] [place]. Pt recently started on gabapentin 100 mg and gradually increased to 300 mg; 300 mg seem to be working well and pt wants to get new rx of gabapentin 300 mg instead of 100 mg. Pt will be in town this week and then out of town for 2 weeks. Pt request refill to be done this week and pt request cb when done.pt last seen annual 09/22/15.

## 2015-10-20 MED ORDER — GABAPENTIN 300 MG PO CAPS: 300.0000 mg | ORAL_CAPSULE | Freq: Every day | ORAL | Status: DC

## 2015-10-20 NOTE — Telephone Encounter (Signed)
[facility] [facility] instructed by Dr. Ermalene SearingBedsole.  Left message for Jae DireKate that prescription has been sent into her pharmacy as requested.

## 2015-10-20 NOTE — Telephone Encounter (Signed)
Okay to refill as requested.

## 2015-10-23 ENCOUNTER — Emergency Department (HOSPITAL_COMMUNITY)
Admission: EM | Admit: 2015-10-23 | Discharge: 2015-10-23 | Disposition: A | Discharge status: Home / Self Care | Payer: 59 | Attending: Emergency Medicine | Admitting: Emergency Medicine

## 2015-10-23 ENCOUNTER — Emergency Department (HOSPITAL_COMMUNITY): Payer: 59

## 2015-10-23 ENCOUNTER — Encounter (HOSPITAL_COMMUNITY): Payer: Self-pay | Admitting: Emergency Medicine

## 2015-10-23 DIAGNOSIS — E119 Type 2 diabetes mellitus without complications: Secondary | ICD-10-CM | POA: Insufficient documentation

## 2015-10-23 DIAGNOSIS — I10 Essential (primary) hypertension: Secondary | ICD-10-CM | POA: Diagnosis not present

## 2015-10-23 DIAGNOSIS — S0990XA Unspecified injury of head, initial encounter: Secondary | ICD-10-CM

## 2015-10-23 DIAGNOSIS — Y999 Unspecified external cause status: Secondary | ICD-10-CM | POA: Diagnosis not present

## 2015-10-23 DIAGNOSIS — Y9259 Other trade areas as the place of occurrence of the external cause: Secondary | ICD-10-CM | POA: Diagnosis not present

## 2015-10-23 DIAGNOSIS — S5011XA Contusion of right forearm, initial encounter: Secondary | ICD-10-CM | POA: Diagnosis not present

## 2015-10-23 DIAGNOSIS — W01198A Fall on same level from slipping, tripping and stumbling with subsequent striking against other object, initial encounter: Secondary | ICD-10-CM | POA: Insufficient documentation

## 2015-10-23 DIAGNOSIS — S0003XA Contusion of scalp, initial encounter: Secondary | ICD-10-CM | POA: Diagnosis not present

## 2015-10-23 DIAGNOSIS — Y9301 Activity, walking, marching and hiking: Secondary | ICD-10-CM | POA: Diagnosis not present

## 2015-10-23 DIAGNOSIS — W19XXXA Unspecified fall, initial encounter: Secondary | ICD-10-CM

## 2015-10-23 NOTE — Discharge Instructions (Signed)
As we discussed, your head CT and x-rays of your arm were normal. You likely have a postconcussive syndrome. See below for more information.  These symptoms may last for up to 2 weeks but should gradually improve. Follow-up with your primary care doctor. Return here for new concerns.   Concussion, Adult A concussion, or closed-head injury, is a brain injury caused by a direct blow to the head or by a quick and sudden movement (jolt) of the head or neck. Concussions are usually not life-threatening. Even so, the effects of a concussion can be serious. If you have had a concussion before, you are more likely to experience concussion-like symptoms after a direct blow to the head.  CAUSES  Direct blow to the head, such as from running into another player during a soccer game, being hit in a fight, or hitting your head on a hard surface.  A jolt of the head or neck that causes the brain to move back and forth inside the skull, such as in a car crash. SIGNS AND SYMPTOMS The signs of a concussion can be hard to notice. Early on, they may be missed by you, family members, and health care providers. You may look fine but act or feel differently. Symptoms are usually temporary, but they may last for days, weeks, or even longer. Some symptoms may appear right away while others may not show up for hours or days. Every head injury is different. Symptoms include:  Mild to moderate headaches that will not go away.  A feeling of pressure inside your head.  Having more trouble than usual:  Learning or remembering things you have heard.  Answering questions.  Paying attention or concentrating.  Organizing daily tasks.  Making decisions and solving problems.  Slowness in thinking, acting or reacting, speaking, or reading.  Getting lost or being easily confused.  Feeling tired all the time or lacking energy (fatigued).  Feeling drowsy.  Sleep disturbances.  Sleeping more than  usual.  Sleeping less than usual.  Trouble falling asleep.  Trouble sleeping (insomnia).  Loss of balance or feeling lightheaded or dizzy.  Nausea or vomiting.  Numbness or tingling.  Increased sensitivity to:  Sounds.  Lights.  Distractions.  Vision problems or eyes that tire easily.  Diminished sense of taste or smell.  Ringing in the ears.  Mood changes such as feeling sad or anxious.  Becoming easily irritated or angry for little or no reason.  Lack of motivation.  Seeing or hearing things other people do not see or hear (hallucinations). DIAGNOSIS Your health care provider can usually diagnose a concussion based on a description of your injury and symptoms. He or she will ask whether you passed out (lost consciousness) and whether you are having trouble remembering events that happened right before and during your injury. Your evaluation might include:  A brain scan to look for signs of injury to the brain. Even if the test shows no injury, you may still have a concussion.  Blood tests to be sure other problems are not present. TREATMENT  Concussions are usually treated in an emergency department, in urgent care, or at a clinic. You may need to stay in the hospital overnight for further treatment.  Tell your health care provider if you are taking any medicines, including prescription medicines, over-the-counter medicines, and natural remedies. Some medicines, such as blood thinners (anticoagulants) and aspirin, may increase the chance of complications. Also tell your health care provider whether you have had alcohol or  are taking illegal drugs. This information may affect treatment.  Your health care provider will send you home with important instructions to follow.  How fast you will recover from a concussion depends on many factors. These factors include how severe your concussion is, what part of your brain was injured, your age, and how healthy you were  before the concussion.  Most people with mild injuries recover fully. Recovery can take time. In general, recovery is slower in older persons. Also, persons who have had a concussion in the past or have other medical problems may find that it takes longer to recover from their current injury. HOME CARE INSTRUCTIONS General Instructions  Carefully follow the directions your health care provider gave you.  Only take over-the-counter or prescription medicines for pain, discomfort, or fever as directed by your health care provider.  Take only those medicines that your health care provider has approved.  Do not drink alcohol until your health care provider says you are well enough to do so. Alcohol and certain other drugs may slow your recovery and can put you at risk of further injury.  If it is harder than usual to remember things, write them down.  If you are easily distracted, try to do one thing at a time. For example, do not try to watch TV while fixing dinner.  Talk with family members or close friends when making important decisions.  Keep all follow-up appointments. Repeated evaluation of your symptoms is recommended for your recovery.  Watch your symptoms and tell others to do the same. Complications sometimes occur after a concussion. Older adults with a brain injury may have a higher risk of serious complications, such as a blood clot on the brain.  Tell your teachers, school nurse, school counselor, coach, athletic trainer, or work Freight forwarder about your injury, symptoms, and restrictions. Tell them about what you can or cannot do. They should watch for:  Increased problems with attention or concentration.  Increased difficulty remembering or learning new information.  Increased time needed to complete tasks or assignments.  Increased irritability or decreased ability to cope with stress.  Increased symptoms.  Rest. Rest helps the brain to heal. Make sure you:  Get plenty of  sleep at night. Avoid staying up late at night.  Keep the same bedtime hours on weekends and weekdays.  Rest during the day. Take daytime naps or rest breaks when you feel tired.  Limit activities that require a lot of thought or concentration. These include:  Doing homework or job-related work.  Watching TV.  Working on the computer.  Avoid any situation where there is potential for another head injury (football, hockey, soccer, basketball, martial arts, downhill snow sports and horseback riding). Your condition will get worse every time you experience a concussion. You should avoid these activities until you are evaluated by the appropriate follow-up health care providers. Returning To Your Regular Activities You will need to return to your normal activities slowly, not all at once. You must give your body and brain enough time for recovery.  Do not return to sports or other athletic activities until your health care provider tells you it is safe to do so.  Ask your health care provider when you can drive, ride a bicycle, or operate heavy machinery. Your ability to react may be slower after a brain injury. Never do these activities if you are dizzy.  Ask your health care provider about when you can return to work or school. Preventing Another  Concussion It is very important to avoid another brain injury, especially before you have recovered. In rare cases, another injury can lead to permanent brain damage, brain swelling, or death. The risk of this is greatest during the first 7-10 days after a head injury. Avoid injuries by:  Wearing a seat belt when riding in a car.  Drinking alcohol only in moderation.  Wearing a helmet when biking, skiing, skateboarding, skating, or doing similar activities.  Avoiding activities that could lead to a second concussion, such as contact or recreational sports, until your health care provider says it is okay.  Taking safety measures in your  home.  Remove clutter and tripping hazards from floors and stairways.  Use grab bars in bathrooms and handrails by stairs.  Place non-slip mats on floors and in bathtubs.  Improve lighting in dim areas. SEEK MEDICAL CARE IF:  You have increased problems paying attention or concentrating.  You have increased difficulty remembering or learning new information.  You need more time to complete tasks or assignments than before.  You have increased irritability or decreased ability to cope with stress.  You have more symptoms than before. Seek medical care if you have any of the following symptoms for more than 2 weeks after your injury:  Lasting (chronic) headaches.  Dizziness or balance problems.  Nausea.  Vision problems.  Increased sensitivity to noise or light.  Depression or mood swings.  Anxiety or irritability.  Memory problems.  Difficulty concentrating or paying attention.  Sleep problems.  Feeling tired all the time. SEEK IMMEDIATE MEDICAL CARE IF:  You have severe or worsening headaches. These may be a sign of a blood clot in the brain.  You have weakness (even if only in one hand, leg, or part of the face).  You have numbness.  You have decreased coordination.  You vomit repeatedly.  You have increased sleepiness.  One pupil is larger than the other.  You have convulsions.  You have slurred speech.  You have increased confusion. This may be a sign of a blood clot in the brain.  You have increased restlessness, agitation, or irritability.  You are unable to recognize people or places.  You have neck pain.  It is difficult to wake you up.  You have unusual behavior changes.  You lose consciousness. MAKE SURE YOU:  Understand these instructions.  Will watch your condition.  Will get help right away if you are not doing well or get worse.   This information is not intended to replace advice given to you by your health care  provider. Make sure you discuss any questions you have with your health care provider.   Document Released: 07/14/2003 Document Revised: 05/14/2014 Document Reviewed: 11/13/2012 Elsevier Interactive Patient Education [employer]

## 2015-10-23 NOTE — ED Provider Notes (Signed)
CSN: [id number]     Arrival date & time 10/23/15  1222 History   First MD Initiated Contact with Patient 10/23/15 1233     Chief Complaint  Patient presents with  . Fall  . Headache     (Consider location/radiation/quality/duration/timing/severity/associated sxs/prior Treatment) The history is provided by the patient and medical records.    55 year old female with history of hypertension, diabetes, Presenting to the ED after a fall that occurred on Thursday. Patient states she was walking to her garage while talking on the family with her mother and tripped over something on the floor. She states she fell backward onto her weight bench and flipped over backwards striking her head against the concrete floor.  She denies LOC.  States she struck her right forearm against some free weights as well.  Reports over the past several days she has had a headache and general sensation of "fogginess". She states her mental processing seems somewhat delayed and she has had some intermittently blurred vision. She denies any nausea, vomiting, dizziness, confusion, changes in speech, focal numbness, or weakness.  No neck or back pain. States her right forearm has begun bruising and is very sore, more so when gripping objects like toothbrush or hairbrush. Patient is right-hand dominant. She is not currently on any type of anticoagulation. No history of head trauma in the past.  Past Medical History  Diagnosis Date  . Hypertension   . Diabetes [facility])    Past Surgical History  Procedure Laterality Date  . Tonsillectomy    . Leep  1990's  . Orif foot fracture  09/2008    L 5th metatarsal  . Refractive surgery  2000   Family History  Problem Relation Age of Onset  . Stroke Father     after hernia suegery  . Prostate cancer Father   . Other Father     global transient amnesia, unclear source  . Other Mother     tachycardia.Marland KitchenMarland Kitchen?afib  . Atrial fibrillation Sister   . Healthy Brother   . Healthy Brother    . Coronary artery disease Paternal Grandmother   . Heart attack Paternal Grandmother 63  . Brain cancer Maternal Grandfather     ?  Marland Kitchen Cancer Paternal Grandfather     ?  Marland Kitchen Breast cancer Maternal Grandmother    Social History  Substance Use Topics  . Smoking status: Never Smoker   . Smokeless tobacco: Never Used  . Alcohol Use: 4.2 oz/week    7 Glasses of wine per week     Comment: wine   OB History    No data available     Review of Systems  Musculoskeletal: Positive for arthralgias.  Neurological: Positive for headaches.  All other systems reviewed and are negative.     Allergies  Simvastatin  Home Medications   Prior to Admission medications   Medication Sig Start Date End Date Taking? Authorizing Provider  gabapentin (NEURONTIN) 300 MG capsule Take 1 capsule (300 mg total) by mouth at bedtime. 10/20/15   Amy Michelle Nasuti, MD  Lancets (ONETOUCH ULTRASOFT) lancets Use as instructed to check blood sugars twice a day     Historical Provider, MD  lisinopril (PRINIVIL,ZESTRIL) 20 MG tablet Take 1 tablet by mouth  daily 08/10/15   Amy E Ermalene Searing, MD  metFORMIN (GLUCOPHAGE-XR) 500 MG 24 hr tablet TAKE 2 TABLETS BY MOUTH IN  THE MORNING AND 1 TABLET BY MOUTH AT BEDTIME 08/10/15   Amy Michelle Nasuti, MD  ONE TOUCH ULTRA  TEST test strip Check blood sugar two times daily as directed 02/10/15   Amy E Ermalene Searing, MD  oxyCODONE-acetaminophen (PERCOCET/ROXICET) 5-325 MG tablet Take 1 tablet by mouth 3 (three) times daily. 09/16/15   Amy E Bedsole, MD   BP 187/110 mmHg  Pulse 103  Temp(Src) 98.2 F (36.8 C) (Oral)  Resp 18  SpO2 99%   Physical Exam  Constitutional: She is oriented to person, place, and time. She appears well-developed and well-nourished.  HENT:  Head: Normocephalic and atraumatic.  Mouth/Throat: Oropharynx is clear and moist.  Small scalp hematomas noted to right parietal region; no bruising around eyes or ears; no hemotympanum; mid-face stable  Eyes: Conjunctivae and EOM are  normal. Pupils are equal, round, and reactive to light.  Neck: Normal range of motion.  Cardiovascular: Normal rate, regular rhythm and normal heart sounds.   Pulmonary/Chest: Effort normal and breath sounds normal. No respiratory distress. She has no wheezes.  Abdominal: Soft. Bowel sounds are normal.  Musculoskeletal: Normal range of motion.       Cervical back: Normal.       Thoracic back: Normal.       Lumbar back: Normal.  Bruising, mild swelling, and tenderness noted to right proximal forearm; full ROM of right elbow, wrist, and all fingers; strong radial pulse and cap refill; normal sensation throughout; normal grip strength  Neurological: She is alert and oriented to person, place, and time.  AAOx3, answering questions and following commands appropriately; equal strength UE and LE bilaterally; CN grossly intact; moves all extremities appropriately without ataxia; no focal neuro deficits or facial asymmetry appreciated; ambulatory with steady gait  Skin: Skin is warm and dry.  Psychiatric: She has a normal mood and affect.  Nursing note and vitals reviewed.   ED Course  Procedures (including critical care time) Labs Review Labs Reviewed - No data to display  Imaging Review Dg Forearm Right  10/23/2015  CLINICAL DATA:  Tripped and fell landing on her head and right arm. Pain, bruising and swelling posteriorly. EXAM: RIGHT FOREARM - 2 VIEW COMPARISON:  None. FINDINGS: Examination demonstrates no evidence of fracture dislocation. Mild ulnar minus variance is present. Remainder of the exam is within normal. IMPRESSION: No acute findings. Electronically Signed   By: Elberta Fortis M.D.   On: 10/23/2015 13:56   Ct Head Wo Contrast  10/23/2015  CLINICAL DATA:  Patient status post fall. Trauma to the posterior and left side of the head. Brief loss of consciousness. Initial encounter. EXAM: CT HEAD WITHOUT CONTRAST TECHNIQUE: Contiguous axial images were obtained from the base of the skull  through the vertex without intravenous contrast. COMPARISON:  MRI head on 04/25/2014. FINDINGS: Ventricles and sulci are appropriate for patient's age. No evidence for acute cortically based infarct, intracranial hemorrhage, mass lesion or mass-effect. Orbits are unremarkable. Mucosal thickening involving the maxillary sinuses bilaterally. Mastoid air cells unremarkable. Calvarium is intact. IMPRESSION: No acute intracranial process. Paranasal sinus mucosal thickening. Electronically Signed   By: Annia Belt M.D.   On: 10/23/2015 14:08   I have personally reviewed and evaluated these images and lab results as part of my medical decision-making.   EKG Interpretation None      MDM   Final diagnoses:  Fall, initial encounter  Head injury, initial encounter  Forearm contusion, right, initial encounter   55 year old female here after fall with head injury 3 days ago. There was no loss of consciousness. She reports continued headache, sensation of "fogginess", and intimately blurred vision. She  is not currently on any type of anticoagulation. Her vital signs are stable.  Her neurologic exam is nonfocal. She does have some swelling and bruising noted to the right proximal forearm. She is right-hand dominant. CT head and plain films of right forearm are negative for any acute findings. Patient likely with postconcussive syndrome. Discharge home with concussion precautions. She is to follow-up with her primary care doctor.  Discussed plan with patient, he/she acknowledged understanding and agreed with plan of care.  Return precautions given for new or worsening symptoms.  Garlon HatchetLisa M Nasya Vincent, PA-C 10/23/15 1451  Alvira MondayErin Schlossman, MD 10/25/15 1630

## 2015-10-23 NOTE — ED Notes (Signed)
Pt sts fall on Thursday hitting head and right forearm; pt sts HA and pain in arm since then

## 2015-10-24 ENCOUNTER — Other Ambulatory Visit: Payer: Self-pay

## 2015-10-24 MED ORDER — OXYCODONE-ACETAMINOPHEN 5-325 MG PO TABS: 1.0000 | ORAL_TABLET | Freq: Three times a day (TID) | ORAL | Status: DC

## 2015-10-24 NOTE — Telephone Encounter (Signed)
Mallory Stewart notified prescription is ready to be picked up at the front desk. She states she is doing okay.  Just trying to rest.  Follow up appointment scheduled with Dr. Patsy Lageropland on 10/27/2015 at 10:30 am.

## 2015-10-24 NOTE — Telephone Encounter (Signed)
Pt left v/m requesting rx oxycodone apap. Call when ready for pick up.last printed # 90 on 09/16/15. Last annual 09/22/2015. Also pt was seen [place] 10/23/15 with concussion after a fall. [facility]

## 2015-10-24 NOTE — Telephone Encounter (Signed)
Please check on her - f/u after fall with concussion recommended, 30 min

## 2015-10-27 ENCOUNTER — Encounter: Payer: Self-pay | Admitting: Family Medicine

## 2015-10-27 ENCOUNTER — Ambulatory Visit (INDEPENDENT_AMBULATORY_CARE_PROVIDER_SITE_OTHER): Payer: 59 | Admitting: Family Medicine

## 2015-10-27 VITALS — BP 180/122 | HR 95 | Temp 98.9°F | Ht 68.5 in | Wt 194.2 lb

## 2015-10-27 DIAGNOSIS — S060X0A Concussion without loss of consciousness, initial encounter: Secondary | ICD-10-CM | POA: Diagnosis not present

## 2015-10-27 NOTE — Progress Notes (Signed)
Dr. Karleen Hampshire T. Cylinda Santoli, MD, CAQ [facility] [address] [place], [address] Phone: [phone] Fax: [phone]  10/27/2015  Patient: Mallory Stewart, MRN: [id number], DOB: 1961/03/17, 55 y.o.  Primary Physician:  Kerby Nora, MD   Chief Complaint  Patient presents with  . Follow-up    Concussion   Subjective:   Mallory Stewart is a 55 y.o. very pleasant female patient who presents with the following:  R forearm hematoma  1 week today. The patient sustained a closed head injury, date of injury 10/20/2015. She tripped in her garage and fell on a weight bench and flipped backwards where she caught herself and hit the back of her head. She also hit her right arm on a metal dumbbell and developed a hematoma.  Since that time, she has been quite symptomatic. Today she has a headache, nausea, balance disturbance, dizziness, visual disturbance, sensitivity to light and sound as well as cognitive fogginess, mental slow down. She also has difficulty concentrating, difficulty remembering. She also feels like she is more irritable and is more emotional than normal.  Notably, after her injury she continued to work. She had some different coffins calls with people and has done work on the computer and been doing different reports for work. On Tuesday she worked all day and had a long workday, and she felt decidedly worse on Wednesday. Yesterday, she did work, but on a lighter degree, and again felt worse.  She drove herself into the office today.  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  Tuesday worked all day - on conference calls.  Felt much worse on Wednesday. Yesterday - did work.   BP Readings from Last 3 Encounters:  10/27/15 180/122  10/23/15 158/94  09/22/15 158/110      Past Medical History, Surgical History, Social History, Family History, Problem List, Medications, and Allergies have been reviewed and  updated if relevant.  Patient Active Problem List   Diagnosis Date Noted  . B12 deficiency 10/21/2014  . Neuropathic pain of both legs 02/11/2014  . Pancreatic pseudocyst/cyst 05/06/2013  . Fatty liver 04/27/2013  . Elevated liver enzymes 02/05/2011  . EXCESSIVE OR FREQUENT MENSTRUATION 05/10/2009  . Diabetes mellitus without complication (HCC) 11/03/2008  . HYPERCHOLESTEROLEMIA 11/03/2008  . Essential hypertension, benign 09/29/2008  . PAP SMEAR, ABNORMAL 09/29/2008    Past Medical History  Diagnosis Date  . Hypertension   . Diabetes [facility])     Past Surgical History  Procedure Laterality Date  . Tonsillectomy    . Leep  1990's  . Orif foot fracture  09/2008    L 5th metatarsal  . Refractive surgery  2000    Social History   Social History  . Marital Status: Married    Spouse Name: N/A  . Number of Children: N/A  . Years of Education: N/A   Occupational History  . Director of business development    Social History Main Topics  . Smoking status: Never Smoker   . Smokeless tobacco: Never Used  . Alcohol Use: 4.2 oz/week    7 Glasses of wine per week     Comment: wine  . Drug Use: No  . Sexual Activity: Not on file   Other Topics Concern  . Not on file   Social History Narrative   Regular exercise , walking 4-5 times a week   Diet fruits and veggies, water    Family History  Problem  Relation Age of Onset  . Stroke Father     after hernia suegery  . Prostate cancer Father   . Other Father     global transient amnesia, unclear source  . Other Mother     tachycardia.Marland Kitchen.Marland Kitchen.?afib  . Atrial fibrillation Sister   . Healthy Brother   . Healthy Brother   . Coronary artery disease Paternal Grandmother   . Heart attack Paternal Grandmother [phone]  . Brain cancer Maternal Grandfather     ?  Marland Kitchen. Cancer Paternal Grandfather     ?  Marland Kitchen. Breast cancer Maternal Grandmother     Allergies  Allergen Reactions  . Simvastatin     Elevated lfts?    Medication list  reviewed and updated in full in [facility].   GEN: No acute illnesses, no fevers, chills. GI: nausea Pulm: No SOB Interactive and getting along well at home. Otherwise, ROS is as per the HPI.  Objective:   BP 180/122 mmHg  Pulse 95  Temp(Src) 98.9 F (37.2 C) (Oral)  Ht 5' 8.5" (1.74 m)  Wt 194 lb 4 oz (88.111 kg)  BMI 29.10 kg/m2   GEN: WDWN, NAD, Non-toxic, A & O x 3 HEENT: Atraumatic, Normocephalic. Neck supple. No masses, No LAD. Ears and Nose: No external deformity. CV: RRR, No M/G/R. No JVD. No thrill. No extra heart sounds. PULM: CTA B, no wheezes, crackles, rhonchi. No retractions. No resp. distress. No accessory muscle use. ABD: S, NT, ND, +BS. No rebound tenderness. No HSM.  EXTR: No c/c/e  Neuro: CN 2-12 grossly intact. PERRLA. EOMI. Sensation intact throughout. Str 5/5 all extremities. DTR 2+. No clonus. A and o x 4. Romberg POS. Finger nose neg. Heel -shin POS. Tandem gait is positive and poor. Unable to stand on 1 foot with eyes open. Unable to stand tandem BESS testing with eyes open.   PSYCH: Normally interactive. Conversant. Not depressed or anxious appearing.  Calm demeanor.      Laboratory and Imaging Data: Dg Forearm Right  10/23/2015  CLINICAL DATA:  Tripped and fell landing on her head and right arm. Pain, bruising and swelling posteriorly. EXAM: RIGHT FOREARM - 2 VIEW COMPARISON:  None. FINDINGS: Examination demonstrates no evidence of fracture dislocation. Mild ulnar minus variance is present. Remainder of the exam is within normal. IMPRESSION: No acute findings. Electronically Signed   By: Elberta Fortisaniel  Boyle M.D.   On: 10/23/2015 13:56   Ct Head Wo Contrast  10/23/2015  CLINICAL DATA:  Patient status post fall. Trauma to the posterior and left side of the head. Brief loss of consciousness. Initial encounter. EXAM: CT HEAD WITHOUT CONTRAST TECHNIQUE: Contiguous axial images were obtained from the base of the skull through the vertex without intravenous  contrast. COMPARISON:  MRI head on 04/25/2014. FINDINGS: Ventricles and sulci are appropriate for patient's age. No evidence for acute cortically based infarct, intracranial hemorrhage, mass lesion or mass-effect. Orbits are unremarkable. Mucosal thickening involving the maxillary sinuses bilaterally. Mastoid air cells unremarkable. Calvarium is intact. IMPRESSION: No acute intracranial process. Paranasal sinus mucosal thickening. Electronically Signed   By: Annia Beltrew  Davis M.D.   On: 10/23/2015 14:08     Assessment and Plan:   Concussion without loss of consciousness, initial encounter  >40 minutes spent in face to face time with patient, >50% spent in counselling or coordination of care   Long conversation with the patient about concussion, risk of working through concussion from a mental and physical standpoint including delayed recovery and risk of  post concussive syndrome. Recommended that the patient stop driving. She has had a closed head injury, and at a bare minimum should not drive for at least 1-61 days.  Recommended complete neurocognitive and physical rest. Reviewed this in great detail. No work for 7 days. In 7 days she may resume work on a light basis where she takes a break of at least 20 minutes hourly. If at any point she becomes more symptomatic, then she needs to stop. She will follow-up with me in 2 weeks.  She recently started gabapentin, so this is somewhat sedating and I am reluctant to start any other additional medication such as a TCA right now. If symptoms persist then we can reevaluate this.  Refer to the patient instructions sections for details of plan shared with patient.   Follow-up: 2 weeks  Signed,  Anias Bartol T. Laronn Devonshire, MD  Patient Instructions  HEAD INJURY / CONCUSSSION:   MOST PEOPLE RECOVER FINE AND COMPLETELY FROM A CONCUSSION, BUT THE MOST IMPORTANT THING IS VERY EARLY COMPLETE REST SO THAT THE BRAN CAN RECOVER.  COMPLETE PHYSICAL AND MENTAL REST IS  NEEDED.  THAT MEANS: NO SCHOOL OR WORK UNTIL YOU ARE BETTER NO PHYSICAL EXERTION AT ALL UNTIL YOU HAVE NO SYMPTOMS NO MENTAL EXERTION, MEANING NO WORK, NO HOMEWORK, NO TEST TAKING.  NO DRIVING UNTIL YOU ARE ASYMPTOMATIC.  NO VIDEO GAMES, NO USING THE COMPUTER, NO TEXTING, NO USING SMARTPHONES, NO USE OF AN IPAD OR TABLET. DO NOT GO TO A MOVIE THEATRE OR WATCH SPORTS ON TV. HDTV TENDS TO MAKE PEOPLE FEEL WORSE.   IN OTHER WORDS, DO NOT DO ANYTHING. SIT AND CALMLY REST UNTIL YOU FEEL BETTER. SLEEP IS OK. YOU CAN HANG OUT AND TALK TO A FRIEND.  IT IS DIFFICULT TO KNOW HOW QUICKLY YOU WILL RECOVER. SOME PEOPLE FEEL BETTER IN A FEW DAYS, WHILE OTHER PEOPLE HAVE SYMPTOMS THAT CAN LAST FOR WEEKS TO MONTHS.  EARLY REST IS BY FAR THE MOST IMPORTANT THING.  If any of the following occur notify your physician or go to the Hospital Emergency Department - if markedly worsening:  . Increased drowsiness, stupor or loss of consciousness . Restlessness or convulsions (fits) . Paralysis in arms or legs . Temperature above 100 F . Vomiting . Severe headache . Blood or clear fluid dripping from the nose or ears . Stiffness of the neck . Dizziness or blurred vision . Pulsating pain in the eye . Unequal pupils of eye . Personality changes . Any other unusual symptoms  PRECAUTIONS . Keep head elevated at all times for the first 24 hours (Elevate mattress if pillow is ineffective) . Do not take tranquilizers, sedatives, narcotics or alcohol . Avoid aspirin. Use only acetaminophen (e.g. Tylenol) or ibuprofen (e.g. Advil) for relief of pain. Follow directions on the bottle for dosage. . Use ice packs for comfort  MEDICATIONS Use medications only as directed by your physician  Concussion Direct trauma to the head often causes a condition known as a concussion. This injury will interfere with brain function and may cause you to lose consciousness. The consequences of a concussion are usually  temporary, but repetitive concussions can be very dangerous. If you have multiple concussions, you will have a greater risk of long-term effects, such as slurred speech, slow movements, impaired thinking, or tremors. The severity of a concussion is based on the length and severity of the interference with brain activity.  SYMPTOMS  Symptoms of a concussion vary depending on the severity of  the injury. Very mild concussions may even occur without any noticeable symptoms. Swelling in the area of the injury is not related to the seriousness of the injury.   CAUSES  A concussion is the result of trauma to the head. When the head is subjected to such an injury, the brain strikes against the inner wall of the skull. This impact is what causes the damage to the brain. The force of injury is related to severity of injury. The most severe concussions are associated with incidents that involve large impact forces such as motor vehicle accidents. Wearing a helmet will reduce the severity of trauma to the head, but concussions may still occur if you are wearing a helmet.  RISK INCREASES WITH:  Contact sports (football, hockey, rugby, or lacrosse).  Fighting sports (martial arts or boxing).  Riding bicycles, motorcycles, or horses (when you ride without a helmet).   PREVENTION  Wear proper protective headgear and ensure correct fit.  Wear seat belts when driving and riding in a car.  Do not drink or use mind-altering drugs and drive.   PROGNOSIS  Concussions are typically curable if they are recognized and treated early. If a severe concussion or multiple concussions go untreated, then the complications may be life-threatening or cause permanent disability and brain damage.  RELATED COMPLICATIONS   Permanent brain damage (slurred speech, slow movement, impaired thinking, or tremors).  Bleeding under the skull (subdural hemorrhage or hematoma, epidural hematoma).  Bleeding into the  brain.  Prolonged healing time if usual activities are resumed too soon.  Infection if skin over the concussion site is broken.  Increased risk of future concussions (less trauma is required for a second concussion than the first).       Patient's Medications  New Prescriptions   No medications on file  Previous Medications   GABAPENTIN (NEURONTIN) 300 MG CAPSULE    Take 1 capsule (300 mg total) by mouth at bedtime.   LANCETS (ONETOUCH ULTRASOFT) LANCETS    Use as instructed to check blood sugars twice a day    LISINOPRIL (PRINIVIL,ZESTRIL) 20 MG TABLET    Take 1 tablet by mouth  daily   METFORMIN (GLUCOPHAGE-XR) 500 MG 24 HR TABLET    TAKE 2 TABLETS BY MOUTH IN  THE MORNING AND 1 TABLET BY MOUTH AT BEDTIME   ONE TOUCH ULTRA TEST TEST STRIP    Check blood sugar two times daily as directed   OXYCODONE-ACETAMINOPHEN (PERCOCET/ROXICET) 5-325 MG TABLET    Take 1 tablet by mouth 3 (three) times daily.  Modified Medications   No medications on file  Discontinued Medications   No medications on file

## 2015-10-27 NOTE — Patient Instructions (Signed)
HEAD INJURY / CONCUSSSION:   MOST PEOPLE RECOVER FINE AND COMPLETELY FROM A CONCUSSION, BUT THE MOST IMPORTANT THING IS VERY EARLY COMPLETE REST SO THAT THE BRAN CAN RECOVER.  COMPLETE PHYSICAL AND MENTAL REST IS NEEDED.  THAT MEANS: NO SCHOOL OR WORK UNTIL YOU ARE BETTER NO PHYSICAL EXERTION AT ALL UNTIL YOU HAVE NO SYMPTOMS NO MENTAL EXERTION, MEANING NO WORK, NO HOMEWORK, NO TEST TAKING.  NO DRIVING UNTIL YOU ARE ASYMPTOMATIC.  NO VIDEO GAMES, NO USING THE COMPUTER, NO TEXTING, NO USING SMARTPHONES, NO USE OF AN IPAD OR TABLET. DO NOT GO TO A MOVIE THEATRE OR WATCH SPORTS ON TV. HDTV TENDS TO MAKE PEOPLE FEEL WORSE.   IN OTHER WORDS, DO NOT DO ANYTHING. SIT AND CALMLY REST UNTIL YOU FEEL BETTER. SLEEP IS OK. YOU CAN HANG OUT AND TALK TO A FRIEND.  IT IS DIFFICULT TO KNOW HOW QUICKLY YOU WILL RECOVER. SOME PEOPLE FEEL BETTER IN A FEW DAYS, WHILE OTHER PEOPLE HAVE SYMPTOMS THAT CAN LAST FOR WEEKS TO MONTHS.  EARLY REST IS BY FAR THE MOST IMPORTANT THING.  If any of the following occur notify your physician or go to the Hospital Emergency Department - if markedly worsening:  . Increased drowsiness, stupor or loss of consciousness . Restlessness or convulsions (fits) . Paralysis in arms or legs . Temperature above 100 F . Vomiting . Severe headache . Blood or clear fluid dripping from the nose or ears . Stiffness of the neck . Dizziness or blurred vision . Pulsating pain in the eye . Unequal pupils of eye . Personality changes . Any other unusual symptoms  PRECAUTIONS . Keep head elevated at all times for the first 24 hours (Elevate mattress if pillow is ineffective) . Do not take tranquilizers, sedatives, narcotics or alcohol . Avoid aspirin. Use only acetaminophen (e.g. Tylenol) or ibuprofen (e.g. Advil) for relief of pain. Follow directions on the bottle for dosage. . Use ice packs for comfort  MEDICATIONS Use medications only as directed by your  physician  Concussion Direct trauma to the head often causes a condition known as a concussion. This injury will interfere with brain function and may cause you to lose consciousness. The consequences of a concussion are usually temporary, but repetitive concussions can be very dangerous. If you have multiple concussions, you will have a greater risk of long-term effects, such as slurred speech, slow movements, impaired thinking, or tremors. The severity of a concussion is based on the length and severity of the interference with brain activity.  SYMPTOMS  Symptoms of a concussion vary depending on the severity of the injury. Very mild concussions may even occur without any noticeable symptoms. Swelling in the area of the injury is not related to the seriousness of the injury.   CAUSES  A concussion is the result of trauma to the head. When the head is subjected to such an injury, the brain strikes against the inner wall of the skull. This impact is what causes the damage to the brain. The force of injury is related to severity of injury. The most severe concussions are associated with incidents that involve large impact forces such as motor vehicle accidents. Wearing a helmet will reduce the severity of trauma to the head, but concussions may still occur if you are wearing a helmet.  RISK INCREASES WITH:  Contact sports (football, hockey, rugby, or lacrosse).  Fighting sports (martial arts or boxing).  Riding bicycles, motorcycles, or horses (when you ride without a helmet).     PREVENTION  Wear proper protective headgear and ensure correct fit.  Wear seat belts when driving and riding in a car.  Do not drink or use mind-altering drugs and drive.   PROGNOSIS  Concussions are typically curable if they are recognized and treated early. If a severe concussion or multiple concussions go untreated, then the complications may be life-threatening or cause permanent disability and brain  damage.  RELATED COMPLICATIONS   Permanent brain damage (slurred speech, slow movement, impaired thinking, or tremors).  Bleeding under the skull (subdural hemorrhage or hematoma, epidural hematoma).  Bleeding into the brain.  Prolonged healing time if usual activities are resumed too soon.  Infection if skin over the concussion site is broken.  Increased risk of future concussions (less trauma is required for a second concussion than the first).  

## 2015-10-27 NOTE — Progress Notes (Signed)
Pre visit review using our clinic review tool, if applicable. No additional management support is needed unless otherwise documented below in the visit note. 

## 2015-11-03 ENCOUNTER — Telehealth: Payer: Self-pay

## 2015-11-03 NOTE — Telephone Encounter (Addendum)
Pt left v/m; pt was seen on 10/27/15 with concussion; pt is supposed to start gradually back to work on 11/04/15; pt would be working from home but pt still has blinding h/a and wants to know if should not work until rechecked on 11/10/15. Per 10/27/15 office note;Recommended complete neurocognitive and physical rest. Reviewed this in great detail. No work for 7 days. In 7 days she may resume work on a light basis where she takes a break of at least 20 minutes hourly. If at any point she becomes more symptomatic, then she needs to stop. She will follow-up with me in 2 weeks. Left v/m for pt to cb. Pt called back; pt has constant h/a at present time; advised per 10/27/15 office note and pt will not start work on 11/04/15. At some point prior to appt if h/a goes away pt will follow instructions given at 10/27/15 appt. FYI to Dr Patsy Lageropland.

## 2015-11-06 NOTE — Telephone Encounter (Signed)
Please check on patient.  If she still has a "blinding headache" or any other symptoms, definitely do not return to work. If still that bad, we should push back her return to work until I recheck her on 11/10/2015.  I want to give her something for post-concussive headache, too.   Nortriptyline 25 mg, 1 po QHS, #30, 0 ref  - It is possible this could make her slightly drowsy, but rare at this low dose.   This is a TCA anti-depressant, but I am giving it to you for post-concussive headache. It is used all the time for migraines.

## 2015-11-07 ENCOUNTER — Other Ambulatory Visit: Payer: Self-pay | Admitting: *Deleted

## 2015-11-07 MED ORDER — GABAPENTIN 300 MG PO CAPS: 300.0000 mg | ORAL_CAPSULE | Freq: Every day | ORAL | Status: DC

## 2015-11-07 MED ORDER — NORTRIPTYLINE HCL 25 MG PO CAPS: 25.0000 mg | ORAL_CAPSULE | Freq: Every day | ORAL | Status: DC

## 2015-11-07 NOTE — Telephone Encounter (Signed)
Spoke with Mallory Stewart.  She states that  confusion and disorientation has gotten a lot better.  She did have a headache this morning.  She states she is working a little bit today and has done okay with that so far.  She is off on July 4 and will plan on working an abbreviated schedule on Wednesday.  She will follow up as scheduled on 11/10/2015.  She would like to try the nortriptyline.  Rx sent to [facility] [place].

## 2015-11-07 NOTE — Telephone Encounter (Signed)
Received refill request for [facility].  Originally refilled at [facility] [place].

## 2015-11-07 NOTE — Telephone Encounter (Signed)
Left message for [name] to return my call.

## 2015-11-10 ENCOUNTER — Encounter: Payer: Self-pay | Admitting: Family Medicine

## 2015-11-10 ENCOUNTER — Ambulatory Visit (INDEPENDENT_AMBULATORY_CARE_PROVIDER_SITE_OTHER): Payer: 59 | Admitting: Family Medicine

## 2015-11-10 VITALS — BP 150/100 | HR 91 | Temp 98.7°F | Ht 68.5 in | Wt 193.8 lb

## 2015-11-10 DIAGNOSIS — F0781 Postconcussional syndrome: Secondary | ICD-10-CM

## 2015-11-10 DIAGNOSIS — S060X0A Concussion without loss of consciousness, initial encounter: Secondary | ICD-10-CM

## 2015-11-10 NOTE — Progress Notes (Signed)
Pre visit review using our clinic review tool, if applicable. No additional management support is needed unless otherwise documented below in the visit note. 

## 2015-11-10 NOTE — Progress Notes (Signed)
Dr. Karleen HampshireSpencer T. Arohi Salvatierra, MD, CAQ Sports Medicine Primary Care and Sports Medicine [address] [place], [id number] Phone: [phone] Fax: [phone]  11/10/2015  Patient: Mallory GageKathleen L Trudell, MRN: [id number], DOB: 1961/04/27, 55 y.o.  Primary Physician:  Kerby NoraAmy Bedsole, MD   Chief Complaint  Patient presents with  . Follow-up    Concussion   Subjective:   Mallory Stewart is a 55 y.o. very pleasant female patient who presents with the following:   Monday night, fed her dog and could not really remember doing that.  Burgess EstelleYesterday, was on a conference call at work. Searching for words during her conference call.   1/2 a day on Monday, 1/2 day yesterday.   The patient did not start her try cyclic, and she had some concerns about possible side effects. I had sent that in for her postconcussive headache.  She actually drove here to the office today. She did resume work on Monday as well as yesterday, and she became symptomatic at each time that she attempted to work. Her thoughts are still slowed, cannot she has some cognitive fogginess in mental slow down, she didn't think she was able to do her work. After which her headache worsened quite a bit more. She also has been more irritable compared to normal.  10/27/2015 Last OV with Hannah BeatSpencer Gladstone Rosas, MD  R forearm hematoma  1 week today. The patient sustained a closed head injury, date of injury 10/20/2015. She tripped in her garage and fell on a weight bench and flipped backwards where she caught herself and hit the back of her head. She also hit her right arm on a metal dumbbell and developed a hematoma.  Since that time, she has been quite symptomatic. Today she has a headache, nausea, balance disturbance, dizziness, visual disturbance, sensitivity to light and sound as well as cognitive fogginess, mental slow down. She also has difficulty concentrating, difficulty remembering. She also feels like she is more irritable and is more  emotional than normal.  Notably, after her injury she continued to work. She had some different coffins calls with people and has done work on the computer and been doing different reports for work. On Tuesday she worked all day and had a long workday, and she felt decidedly worse on Wednesday. Yesterday, she did work, but on a lighter degree, and again felt worse.  She drove herself into the office today.  [place] in [place] home [place].  Tuesday worked all day - on conference calls.  Felt much worse on Wednesday. Yesterday - did work.   BP Readings from Last 3 Encounters:  11/10/15 150/100  10/27/15 180/122  10/23/15 158/94      Past Medical History, Surgical History, Social History, Family History, Problem List, Medications, and Allergies have been reviewed and updated if relevant.  Patient Active Problem List   Diagnosis Date Noted  . B12 deficiency 10/21/2014  . Neuropathic pain of both legs 02/11/2014  . Pancreatic pseudocyst/cyst 05/06/2013  . Fatty liver 04/27/2013  . Elevated liver enzymes 02/05/2011  . EXCESSIVE OR FREQUENT MENSTRUATION 05/10/2009  . Diabetes mellitus without complication (HCC) 11/03/2008  . HYPERCHOLESTEROLEMIA 11/03/2008  . Essential hypertension, benign 09/29/2008  . PAP SMEAR, ABNORMAL 09/29/2008    Past Medical History  Diagnosis Date  . Hypertension   . Diabetes [facility](HCC)     Past Surgical History  Procedure Laterality Date  . Tonsillectomy    . Leep  1990's  . Orif  foot fracture  09/2008    L 5th metatarsal  . Refractive surgery  2000    Social History   Social History  . Marital Status: Married    Spouse Name: N/A  . Number of Children: N/A  . Years of Education: N/A   Occupational History  . Director of business development    Social History Main Topics  . Smoking status: Never Smoker   . Smokeless tobacco: Never Used  . Alcohol Use: 4.2 oz/week    7 Glasses of wine per week     Comment: wine   . Drug Use: No  . Sexual Activity: Not on file   Other Topics Concern  . Not on file   Social History Narrative   Regular exercise , walking 4-5 times a week   Diet fruits and veggies, water    Family History  Problem Relation Age of Onset  . Stroke Father     after hernia suegery  . Prostate cancer Father   . Other Father     global transient amnesia, unclear source  . Other Mother     tachycardia.Marland KitchenMarland Kitchen?afib  . Atrial fibrillation Sister   . Healthy Brother   . Healthy Brother   . Coronary artery disease Paternal Grandmother   . Heart attack Paternal Grandmother 56  . Brain cancer Maternal Grandfather     ?  Marland Kitchen Cancer Paternal Grandfather     ?  Marland Kitchen Breast cancer Maternal Grandmother     Allergies  Allergen Reactions  . Simvastatin     Elevated lfts?    Medication list reviewed and updated in full in [facility].   GEN: No acute illnesses, no fevers, chills. GI: nausea Pulm: No SOB Interactive and getting along well at home. Otherwise, ROS is as per the HPI.  Objective:   BP 150/100 mmHg  Pulse 91  Temp(Src) 98.7 F (37.1 C) (Oral)  Ht 5' 8.5" (1.74 m)  Wt 193 lb 12 oz (87.884 kg)  BMI 29.03 kg/m2   GEN: WDWN, NAD, Non-toxic, A & O x 3 HEENT: Atraumatic, Normocephalic. Neck supple. No masses, No LAD. Ears and Nose: No external deformity. CV: RRR, No M/G/R. No JVD. No thrill. No extra heart sounds. PULM: CTA B, no wheezes, crackles, rhonchi. No retractions. No resp. distress. No accessory muscle use. ABD: S, NT, ND, +BS. No rebound tenderness. No HSM.  EXTR: No c/c/e  Neuro: CN 2-12 grossly intact. PERRLA. EOMI. Sensation intact throughout. Str 5/5 all extremities. DTR 2+. No clonus. A and o x 4. Romberg POS. Finger nose neg. Heel -shin POS. Unable to stand on 1 foot with eyes closed. Unable to stand tandem BESS testing with eyes closed  While still abnormal, improved compared to last examination.  PSYCH: Normally interactive. Conversant. Not  depressed or anxious appearing.  Calm demeanor.      Laboratory and Imaging Data: Dg Forearm Right  10/23/2015  CLINICAL DATA:  Tripped and fell landing on her head and right arm. Pain, bruising and swelling posteriorly. EXAM: RIGHT FOREARM - 2 VIEW COMPARISON:  None. FINDINGS: Examination demonstrates no evidence of fracture dislocation. Mild ulnar minus variance is present. Remainder of the exam is within normal. IMPRESSION: No acute findings. Electronically Signed   By: Elberta Fortis M.D.   On: 10/23/2015 13:56   Ct Head Wo Contrast  10/23/2015  CLINICAL DATA:  Patient status post fall. Trauma to the posterior and left side of the head. Brief loss of consciousness. Initial encounter.  EXAM: CT HEAD WITHOUT CONTRAST TECHNIQUE: Contiguous axial images were obtained from the base of the skull through the vertex without intravenous contrast. COMPARISON:  MRI head on 04/25/2014. FINDINGS: Ventricles and sulci are appropriate for patient's age. No evidence for acute cortically based infarct, intracranial hemorrhage, mass lesion or mass-effect. Orbits are unremarkable. Mucosal thickening involving the maxillary sinuses bilaterally. Mastoid air cells unremarkable. Calvarium is intact. IMPRESSION: No acute intracranial process. Paranasal sinus mucosal thickening. Electronically Signed   By: Annia Beltrew  Davis M.D.   On: 10/23/2015 14:08     Assessment and Plan:   Post concussive syndrome  Concussion without loss of consciousness, initial encounter  >25 minutes spent in face to face time with patient, >50% spent in counselling or coordination of care: Long conversation about postconcussive syndrome, wrists of returning to work too early, risks of driving, and risks of pushing through symptoms prolonging the course of her recovery. I believe that she understands.  I have recommended that she stay out of work entirely for 2 weeks for physical and mental rest. Recommended no driving and continue physical and mental  rest while symptomatic. I think that she is at this point okay to do some small challenges to see how she feels such as reading a small amount or watching a short television program.  Follow-up: Return in about 2 weeks (around 11/24/2015).  Signed,  Elpidio GaleaSpencer T. Rozelia Catapano, MD   Patient's Medications  New Prescriptions   No medications on file  Previous Medications   GABAPENTIN (NEURONTIN) 300 MG CAPSULE    Take 1 capsule (300 mg total) by mouth at bedtime.   LANCETS (ONETOUCH ULTRASOFT) LANCETS    Use as instructed to check blood sugars twice a day    LISINOPRIL (PRINIVIL,ZESTRIL) 20 MG TABLET    Take 1 tablet by mouth  daily   METFORMIN (GLUCOPHAGE-XR) 500 MG 24 HR TABLET    TAKE 2 TABLETS BY MOUTH IN  THE MORNING AND 1 TABLET BY MOUTH AT BEDTIME   NORTRIPTYLINE (PAMELOR) 25 MG CAPSULE    Take 1 capsule (25 mg total) by mouth at bedtime.   ONE TOUCH ULTRA TEST TEST STRIP    Check blood sugar two times daily as directed   OXYCODONE-ACETAMINOPHEN (PERCOCET/ROXICET) 5-325 MG TABLET    Take 1 tablet by mouth 3 (three) times daily.  Modified Medications   No medications on file  Discontinued Medications   No medications on file

## 2015-11-12 ENCOUNTER — Other Ambulatory Visit: Payer: Self-pay | Admitting: Family Medicine

## 2015-11-14 ENCOUNTER — Telehealth: Payer: Self-pay

## 2015-11-14 NOTE — Telephone Encounter (Signed)
Pt left v/m requesting cb about short term disability form.

## 2015-11-16 NOTE — Telephone Encounter (Signed)
Spoke to pt. She is going to fax it to me today. Pt states this is time sensitive and due on Monday July 17th. I informed pt Dr. Patsy Lageropland was out of office Thurs and Fri of this week and we would do our best to get it completed by Monday 7/17.  Pt will pick up forms herself once completed.

## 2015-11-16 NOTE — Telephone Encounter (Signed)
done

## 2015-11-16 NOTE — Telephone Encounter (Signed)
Attending Physicians Statement in Dr. Patsy Lageropland Inbox.  Please return to [place] once complete. Please see note below about time sensitivity.

## 2015-11-17 NOTE — Telephone Encounter (Signed)
Pt aware form is ready for pick up. Pt will turn in herself. Pt copy up front ready for pick up.  Copy for office file. Copy to scan. Copy sent to Dr. Patsy Lageropland to see if there is a charge?

## 2015-11-21 NOTE — Telephone Encounter (Signed)
No charge per Dr. Copland.  

## 2015-11-23 ENCOUNTER — Ambulatory Visit (INDEPENDENT_AMBULATORY_CARE_PROVIDER_SITE_OTHER): Payer: 59 | Admitting: Family Medicine

## 2015-11-23 ENCOUNTER — Telehealth: Payer: Self-pay | Admitting: Family Medicine

## 2015-11-23 ENCOUNTER — Encounter: Payer: Self-pay | Admitting: Family Medicine

## 2015-11-23 VITALS — BP 162/108 | HR 122 | Temp 98.5°F | Ht 68.5 in | Wt 191.5 lb

## 2015-11-23 DIAGNOSIS — F0781 Postconcussional syndrome: Secondary | ICD-10-CM | POA: Diagnosis not present

## 2015-11-23 MED ORDER — LISINOPRIL 40 MG PO TABS: 40.0000 mg | ORAL_TABLET | Freq: Every day | ORAL | Status: DC

## 2015-11-23 MED ORDER — NORTRIPTYLINE HCL 50 MG PO CAPS: ORAL_CAPSULE | ORAL | Status: DC

## 2015-11-23 NOTE — Progress Notes (Signed)
Pre visit review using our clinic review tool, if applicable. No additional management support is needed unless otherwise documented below in the visit note. 

## 2015-11-23 NOTE — Patient Instructions (Signed)

## 2015-11-23 NOTE — Telephone Encounter (Signed)
Would you call Mallory Stewart -   Her BP has been up. This could possibly be playing a role with her headaches.   Increase lisinopril to 40 mg, 1 po daily, #30, 2 refills

## 2015-11-23 NOTE — Progress Notes (Signed)
Dr. Karleen Hampshire T. Dimitrious Micciche, MD, CAQ Sports Medicine Primary Care and Sports Medicine [address] [place], [address] Phone: [phone] Fax: [phone]  11/23/2015  Patient: Mallory Stewart, MRN: [id number], DOB: June 02, 1960, 55 y.o.  Primary Physician:  Kerby Nora, MD   Chief Complaint  Patient presents with  . Follow-up    Concussion   Subjective:   Mallory Stewart is a 55 y.o. very pleasant female patient who presents with the following:   Initially, the patient sustained her closed head injury on October 20, 2015.  Details in this regard are below.  Initially, she pushed through her symptoms for the first 3 or 4 days and got notably worse, but since then she has become compliant with recommendations for treatment.  She has been out of work for more than a month now, and she has been initially on full mental and physical rest.  For the last 1-2 weeks she has been on relative rest.  Her headache did improve significantly when she was placed on nortriptyline, but over the last few days her headache has gotten significantly worse.  When asked her if she is improved, she says "somewhat.  " She still feels somewhat foggy.  She has been calling her dog by the wrong name intermittently, by a different dog's name.  She also feels like her balance is still off somewhat.  At this point she does not feel like she has returned to baseline.  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  11/10/2015 Last OV with Hannah Beat, MD  Monday night, fed her dog and could not really remember doing that.  Burgess Estelle, was on a conference call at work. Searching for words during her conference call.   1/2 a day on Monday, 1/2 day yesterday.   The patient did not start her try cyclic, and she had some concerns about possible side effects. I had sent that in for her postconcussive headache.  She actually drove here to the office  today. She did resume work on Monday as well as yesterday, and she became symptomatic at each time that she attempted to work. Her thoughts are still slowed, cannot she has some cognitive fogginess in mental slow down, she didn't think she was able to do her work. After which her headache worsened quite a bit more. She also has been more irritable compared to normal.  10/27/2015 Last OV with Hannah Beat, MD  R forearm hematoma  1 week today. The patient sustained a closed head injury, date of injury 10/20/2015. She tripped in her garage and fell on a weight bench and flipped backwards where she caught herself and hit the back of her head. She also hit her right arm on a metal dumbbell and developed a hematoma.  Since that time, she has been quite symptomatic. Today she has a headache, nausea, balance disturbance, dizziness, visual disturbance, sensitivity to light and sound as well as cognitive fogginess, mental slow down. She also has difficulty concentrating, difficulty remembering. She also feels like she is more irritable and is more emotional than normal.  Notably, after her injury she continued to work. She had some different coffins calls with people and has done work on the computer and been doing different reports for work. On Tuesday she worked all day and had a long workday, and she felt decidedly worse on Wednesday. Yesterday, she did work, but on  a lighter degree, and again felt worse.  She drove herself into the office today.  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  Tuesday worked all day - on conference calls.  Felt much worse on Wednesday. Yesterday - did work.   BP Readings from Last 3 Encounters:  11/23/15 162/108  11/10/15 150/100  10/27/15 180/122      Past Medical History, Surgical History, Social History, Family History, Problem List, Medications, and Allergies have been reviewed and updated if relevant.  Patient Active Problem List    Diagnosis Date Noted  . Post concussive syndrome 11/23/2015  . B12 deficiency 10/21/2014  . Neuropathic pain of both legs 02/11/2014  . Pancreatic pseudocyst/cyst 05/06/2013  . Fatty liver 04/27/2013  . Elevated liver enzymes 02/05/2011  . EXCESSIVE OR FREQUENT MENSTRUATION 05/10/2009  . Diabetes mellitus without complication (HCC) 11/03/2008  . HYPERCHOLESTEROLEMIA 11/03/2008  . Essential hypertension, benign 09/29/2008  . PAP SMEAR, ABNORMAL 09/29/2008    Past Medical History  Diagnosis Date  . Hypertension   . Diabetes [facility])     Past Surgical History  Procedure Laterality Date  . Tonsillectomy    . Leep  1990's  . Orif foot fracture  09/2008    L 5th metatarsal  . Refractive surgery  2000    Social History   Social History  . Marital Status: Married    Spouse Name: N/A  . Number of Children: N/A  . Years of Education: N/A   Occupational History  . Director of business development    Social History Main Topics  . Smoking status: Never Smoker   . Smokeless tobacco: Never Used  . Alcohol Use: 4.2 oz/week    7 Glasses of wine per week     Comment: wine  . Drug Use: No  . Sexual Activity: Not on file   Other Topics Concern  . Not on file   Social History Narrative   Regular exercise , walking 4-5 times a week   Diet fruits and veggies, water    Family History  Problem Relation Age of Onset  . Stroke Father     after hernia suegery  . Prostate cancer Father   . Other Father     global transient amnesia, unclear source  . Other Mother     tachycardia.Marland KitchenMarland Kitchen?afib  . Atrial fibrillation Sister   . Healthy Brother   . Healthy Brother   . Coronary artery disease Paternal Grandmother   . Heart attack Paternal Grandmother 73  . Brain cancer Maternal Grandfather     ?  Marland Kitchen Cancer Paternal Grandfather     ?  Marland Kitchen Breast cancer Maternal Grandmother     Allergies  Allergen Reactions  . Simvastatin     Elevated lfts?    Medication list reviewed and updated  in full in [facility].   GEN: No acute illnesses, no fevers, chills. GI: nausea Pulm: No SOB Interactive and getting along well at home. Otherwise, ROS is as per the HPI.  Objective:   BP 162/108 mmHg  Pulse 122  Temp(Src) 98.5 F (36.9 C) (Oral)  Ht 5' 8.5" (1.74 m)  Wt 191 lb 8 oz (86.864 kg)  BMI 28.69 kg/m2   GEN: WDWN, NAD, Non-toxic, A & O x 3 HEENT: Atraumatic, Normocephalic. Neck supple. No masses, No LAD. Ears and Nose: No external deformity. CV: RRR, No M/G/R. No JVD. No thrill. No extra heart sounds. PULM: CTA B, no wheezes, crackles, rhonchi. No retractions.  No resp. distress. No accessory muscle use. ABD: S, NT, ND, +BS. No rebound tenderness. No HSM.  EXTR: No c/c/e  Neuro: CN 2-12 grossly intact. PERRLA. EOMI. Sensation intact throughout. Str 5/5 all extremities. DTR 2+. No clonus. A and o x 4. Romberg Neg. Finger nose neg. Heel -shin neg. Unable to stand on 1 foot with eyes closed. Unable to stand tandem BESS testing with eyes closed  While still abnormal, improved compared to last examination.  PSYCH: Normally interactive. Conversant. Not depressed or anxious appearing.  Calm demeanor.      Laboratory and Imaging Data:  Ct Head Wo Contrast  10/23/2015  CLINICAL DATA:  Patient status post fall. Trauma to the posterior and left side of the head. Brief loss of consciousness. Initial encounter. EXAM: CT HEAD WITHOUT CONTRAST TECHNIQUE: Contiguous axial images were obtained from the base of the skull through the vertex without intravenous contrast. COMPARISON:  MRI head on 04/25/2014. FINDINGS: Ventricles and sulci are appropriate for patient's age. No evidence for acute cortically based infarct, intracranial hemorrhage, mass lesion or mass-effect. Orbits are unremarkable. Mucosal thickening involving the maxillary sinuses bilaterally. Mastoid air cells unremarkable. Calvarium is intact. IMPRESSION: No acute intracranial process. Paranasal sinus mucosal  thickening. Electronically Signed   By: Annia Beltrew  Davis M.D.   On: 10/23/2015 14:08    Assessment and Plan:   Post concussive syndrome - Plan: Ambulatory referral to Neurology  >25 minutes spent in face to face time with patient, >50% spent in counselling or coordination of care: she is still symptomatic now 5 weeks after initial injury with post-concussive symptoms.  Intermittent problems with sleeping, some cognitive fogginess, headache, anxiety, and her balance has not returned to baseline.  We will tried to increase her nortriptyline to 100 mg at nighttime, and if she can tolerate that based on the side effects, continue this.   I have also asked her to start some Fish oil.  At this point, I have asked her to see neurology for their opinion as well.  She has previously seen Dr. Everlena CooperJaffe, and I appreciate his input.  Ideally, she could return to work in a very slow pace with rest as much as she needed, but with the pace of her work and her job that does not seem an option.  For now, recommended the patient remain out of work for her recovery.  Follow-up: with Neurology  Signed,  Karleen HampshireSpencer T. Trinita Devlin, MD   Patient's Medications  New Prescriptions   No medications on file  Previous Medications   GABAPENTIN (NEURONTIN) 300 MG CAPSULE    Take 1 capsule (300 mg total) by mouth at bedtime.   LANCETS (ONETOUCH ULTRASOFT) LANCETS    Use as instructed to check blood sugars twice a day    LISINOPRIL (PRINIVIL,ZESTRIL) 20 MG TABLET    Take 1 tablet by mouth  daily   METFORMIN (GLUCOPHAGE-XR) 500 MG 24 HR TABLET    Take 2 tablets by mouth in  the morning and 1 tablet at bedtime   ONE TOUCH ULTRA TEST TEST STRIP    Check blood sugar two times daily as directed   OXYCODONE-ACETAMINOPHEN (PERCOCET/ROXICET) 5-325 MG TABLET    Take 1 tablet by mouth 3 (three) times daily.  Modified Medications   Modified Medication Previous Medication   NORTRIPTYLINE (PAMELOR) 50 MG CAPSULE nortriptyline (PAMELOR) 25 MG  capsule      1 po QHS, increase to 2 caps QHS as tolerated    Take 1 capsule (25 mg total)  by mouth at bedtime.  Discontinued Medications   NORTRIPTYLINE (PAMELOR) 25 MG CAPSULE    Take 50 mg by mouth at bedtime.

## 2015-11-23 NOTE — Telephone Encounter (Signed)
Jae DireKate notified as instructed by telephone.  Advised to take 2 tablets of her 20 mg tablets until she uses those up and a new Rx for Lisinopril 40 mg sent into [facility].

## 2015-11-28 ENCOUNTER — Other Ambulatory Visit: Payer: Self-pay | Admitting: Family Medicine

## 2015-11-28 NOTE — Telephone Encounter (Signed)
Pt left v/m requesting rx oxycodone apap. Call when ready for pick up.last printed # 90 on 10/24/15. Last annual 09/22/2015, last seen in office 11/23/15.  Best number to call is [phone] when ready for pickup

## 2015-11-29 MED ORDER — OXYCODONE-ACETAMINOPHEN 5-325 MG PO TABS: 1.0000 | ORAL_TABLET | Freq: Three times a day (TID) | ORAL | 0 refills | Status: DC

## 2015-11-29 NOTE — Telephone Encounter (Signed)
Patient notified by telephone that script is up front ready for pickup. 

## 2015-11-29 NOTE — Telephone Encounter (Signed)
I need this in prescription request form.

## 2015-11-30 ENCOUNTER — Telehealth: Payer: Self-pay | Admitting: Family Medicine

## 2015-11-30 ENCOUNTER — Ambulatory Visit (INDEPENDENT_AMBULATORY_CARE_PROVIDER_SITE_OTHER): Payer: 59 | Admitting: Neurology

## 2015-11-30 ENCOUNTER — Encounter: Payer: Self-pay | Admitting: Neurology

## 2015-11-30 VITALS — BP 190/120 | HR 125 | Ht 68.0 in | Wt 194.8 lb

## 2015-11-30 DIAGNOSIS — F0781 Postconcussional syndrome: Secondary | ICD-10-CM | POA: Diagnosis not present

## 2015-11-30 NOTE — Telephone Encounter (Signed)
Spoke to pt, scheduled her for 7.28.17 @ 745am.

## 2015-11-30 NOTE — Telephone Encounter (Signed)
Patient was referred to Dr. Katrinka Blazing by Dr. Everlena Cooper.  Patient states she had a fall an concussion.  Patient states she can not go back to work until she is cleared by Dr. Katrinka Blazing.  The first appt I could get for patient is Aug 2nd.  She is requesting if Dr. Katrinka Blazing gets any cancellations or can do a work in to give her a call.

## 2015-11-30 NOTE — Patient Instructions (Signed)
I would like to refer you to Dr. Antoine Primas.  He is a Sports Medicine physician who treats postconcussion syndrome.  I would remain out of work until his assessment.  Until then, I recommend: Continue to limit screen time I want you to be active.  Start with walking outside daily down the street and back and add a little distance daily.   In addition to this I recommend......  To help improve COGNITIVE function: Using fish oil/omega 3 that is 1000 mg (or roughly 600 mg EPA/DHA), starting as soon as possible after concussion, take: 3 tabs THREE TIMES a day  for the first 3 days, then (you will smell a little, sory) 3 tabs TWICE DAILY  for the next 3 days, then 3 tabs ONCE DAILY  for the next 10 days    To help reduce HEADACHES: Coenzyme Q10 160mg  ONCE DAILY Riboflavin/Vitamin B2 400mg  ONCE DAILY Magnesium oxide 400mg  ONCE - TWICE DAILY May stop after headaches are resolved.                                                                                               To help with INSOMNIA: Melatonin 3-5mg  AT BEDTIME       Other medicines to help decrease inflammation Alpha Lipoic Acid 100mg  TWICE DAILY Turmeric 500mg  twice daily Iron 65mg  elemental daily Vitamin D 4000 IU daily for 2 weeks then 2000 IU daily thereafter.

## 2015-11-30 NOTE — Progress Notes (Signed)
NEUROLOGY FOLLOW UP OFFICE NOTE  Mallory Stewart [id number]  HISTORY OF PRESENT ILLNESS: Mallory Stewart is a 55 year old woman with hypertension and diabetes whom I previously saw for paresthesias presents today for postconcussion syndrome.  History obtained by patient, ED note and PCP note.  On 10/20/15, she tripped in the garage, falling backward and striking her head on the concrete floor.  She is not sure but may have sustained loss of consciousness for a couple of seconds.  Afterwards, she developed a "fogginess" and word-finding difficulty.  She developed a rights sided headache from the back of her head radiating to the right retro-orbital region.  She presented to the ED on 10/23/15 for further evaluation.  CT of head was personally reviewed and was unremarkable.  She returned to work immediately and reported worsening headache and word-finding difficulty.  She works from home but often travels via car.  Driving triggers headache.  She was unable to participate in conference calls due to word-finding problems.  She is typically on the computer all the time.  She followed up with her PCP a week later and was put on short-term disability.  She was started on nortriptyline.  Since increasing dose to '100mg'$  at bedtime, headaches have improved.  She also reports some short-term memory problems such as forgetting if she fed the dogs.   Overall, she is doing better.  However, she developed a headache on the drive over here.  She wants to return to work but the thought of it causes increased headache as well.  Labs from May include unremarkable CMP with Na 138, K 4.6, glucose 109, BUN 12, Cr 0.73, TB 0.6, alk phos 44, and mildly elevated AST 45 and ALT 41; B12 was 760.  Of note, she went to [facility] for evaluation of neuropathy.  Workup was negative without definite diagnosis.  PAST MEDICAL HISTORY: Past Medical History:  Diagnosis Date  . Diabetes ([place])   . Hypertension      MEDICATIONS: Current Outpatient Prescriptions on File Prior to Visit  Medication Sig Dispense Refill  . gabapentin (NEURONTIN) 300 MG capsule Take 1 capsule (300 mg total) by mouth at bedtime. 90 capsule 3  . Lancets (ONETOUCH ULTRASOFT) lancets Use as instructed to check blood sugars twice a day     . lisinopril (PRINIVIL,ZESTRIL) 40 MG tablet Take 1 tablet (40 mg total) by mouth daily. 90 tablet 0  . metFORMIN (GLUCOPHAGE-XR) 500 MG 24 hr tablet Take 2 tablets by mouth in  the morning and 1 tablet at bedtime 270 tablet 1  . nortriptyline (PAMELOR) 50 MG capsule 1 po QHS, increase to 2 caps QHS as tolerated (Patient taking differently: Take 50 mg by mouth at bedtime. 1 po QHS, increase to 2 caps QHS as tolerated) 60 capsule 2  . ONE TOUCH ULTRA TEST test strip Check blood sugar two times daily as directed 150 each 3  . oxyCODONE-acetaminophen (PERCOCET/ROXICET) 5-325 MG tablet Take 1 tablet by mouth 3 (three) times daily. 90 tablet 0   No current facility-administered medications on file prior to visit.     ALLERGIES: Allergies  Allergen Reactions  . Simvastatin     Elevated lfts?    FAMILY HISTORY: Family History  Problem Relation Age of Onset  . Other Mother     tachycardia.Marland KitchenMarland Kitchen?afib  . Stroke Father     after hernia suegery  . Prostate cancer Father   . Other Father     global transient amnesia, unclear source  .  Atrial fibrillation Sister   . Healthy Brother   . Healthy Brother   . Coronary artery disease Paternal Grandmother   . Heart attack Paternal Grandmother 35  . Brain cancer Maternal Grandfather     ?  Marland Kitchen Cancer Paternal Grandfather     ?  Marland Kitchen Breast cancer Maternal Grandmother     SOCIAL HISTORY: Social History   Social History  . Marital status: Married    Spouse name: N/A  . Number of children: N/A  . Years of education: N/A   Occupational History  . Director of business development    Social History Main Topics  . Smoking status: Never Smoker   . Smokeless tobacco: Never Used  . Alcohol use 4.2 oz/week    7 Glasses of wine per week     Comment: wine  . Drug use: No  . Sexual activity: Not on file   Other Topics Concern  . Not on file   Social History Narrative   Regular exercise , walking 4-5 times a week   Diet fruits and veggies, water    REVIEW OF SYSTEMS: Constitutional: No fevers, chills, or sweats, no generalized fatigue, change in appetite Eyes: No visual changes, double vision, eye pain Ear, nose and throat: No hearing loss, ear pain, nasal congestion, sore throat Cardiovascular: No chest pain, palpitations Respiratory:  No shortness of breath at rest or with exertion, wheezes GastrointestinaI: No nausea, vomiting, diarrhea, abdominal pain, fecal incontinence Genitourinary:  No dysuria, urinary retention or frequency Musculoskeletal:  No neck pain, back pain Integumentary: No rash, pruritus, skin lesions Neurological: as above Psychiatric: No depression, insomnia, anxiety Endocrine: No palpitations, fatigue, diaphoresis, mood swings, change in appetite, change in weight, increased thirst Hematologic/Lymphatic:  No purpura, petechiae. Allergic/Immunologic: no itchy/runny eyes, nasal congestion, recent allergic reactions, rashes  PHYSICAL EXAM: Vitals:   11/30/15 0854  BP: (!) 190/120  Pulse: (!) 125   General: No acute distress.  Patient appears well-groomed.  normal body habitus. Head:  Normocephalic/atraumatic Eyes:  Fundi examined but not visualized Neck: supple, no paraspinal tenderness, full range of motion Heart:  Regular rate and rhythm Lungs:  Clear to auscultation bilaterally Back: No paraspinal tenderness Neurological Exam: alert and oriented to person, place, and time. Attention span and concentration intact, recent and remote memory intact, fund of knowledge intact.  Speech fluent and not dysarthric, language intact.   MMSE - Mini Mental State Exam 11/30/2015  Orientation to time 5   Orientation to Place 5  Registration 3  Attention/ Calculation 5  Recall 3  Language- name 2 objects 2  Language- repeat 1  Language- follow 3 step command 3  Language- read & follow direction 1  Write a sentence 1  Copy design 1  Total score 30   CN II-XII intact. Bulk and tone normal, muscle strength 5/5 throughout.  Sensation to pinprick reduced up to just above the knees, reduced vibration up to ankles.  Deep tendon reflexes 2+ throughout, toes downgoing.  Finger to nose and heel to shin testing intact.  Gait normal, Romberg negative.  IMPRESSION: Postconcussion syndrome.  Since driving as well as concentration caused increased headache, I would continue to remain out of work.  PLAN: 1.  Will refer to Dr. Hulan Saas for evaluation and treatment.  She may need neuro-rehab or cognitive behavioral therapy. 2.  Recommend supplements (melatonin for insomnia; Mg, riboflavin, CoQ-10 for headache; fish oil, alpha-lipoic acid and turmeric to decrease inflammation) 3.  Slowly increase light activity such as  walking  27 minutes spent face to face with patient, over 50% spent counseling.  Metta Clines, DO  CC:  Owens Loffler, MD

## 2015-11-30 NOTE — Telephone Encounter (Signed)
Can do one of the 745 this week.

## 2015-12-01 NOTE — Progress Notes (Signed)
[facility] [address] [place], [place] [address] Phone: [phone] Subjective:    I'm seeing this patient by the request  of:  Mallory Stewart  CC: concussion   [id number]  Mallory Stewart is a 55 y.o. female coming in with complaint of concussion.  Has had some trouble.  Patient did have closed head injury on 10/20/2015. She initially tripped and or carotid falling backwards tracking the back of her head on concrete. She does not rib or any true loss of consciousness. Had significant fibrinous and finding words difficulty. Had a right-sided headache that seemed to be significantly worse radiating towards her thigh. Went to the emergency room 3 days later. Independently visualized patient's CT of her head which was unremarkable. Patient went to work and unfortunately was unable to continue secondary to headache and word finding difficult he. Other factors seem to be driving and staring in a screen for a long amount of time. Patient did see Dr. Dallas Stewart who put her on short-term disability and nortriptyline 100 mg at bedtime which didn't seem to help her headache but unfortunately continues to have memory problems. Patient still has intermittent headaches. Once to return to work workup but is concerned if she will be able to do it or not.patient denies any associated nausea, vomiting, any weight loss recently. Patient denies any type of visual disturbances with any of the headaches. Patient did see neurology and didn't discuss over-the-counter natural supplementations which is something we usually recommend as well.  Patient states overall she has made improvement but continues to have difficulty with mine processing. Seems like in the morning seems to be worse and take some time. She seems to be in a regular routine she seems to do better. Still having intermittent headaches. Neuro was having headaches previous to the injury. Patient denies any other visual changes. Other  than that she feels like herself most of the time. Was able to read yesterday for 30 minutes without any significant headaches     Past Medical History:  Diagnosis Date  . Diabetes ([facility])   . Hypertension    Past Surgical History:  Procedure Laterality Date  . LEEP  1990's  . ORIF FOOT FRACTURE  09/2008   L 5th metatarsal  . REFRACTIVE SURGERY  2000  . TONSILLECTOMY     Social History   Social History  . Marital status: Married    Spouse name: N/A  . Number of children: N/A  . Years of education: N/A   Occupational History  . Director of business development    Social History Main Topics  . Smoking status: Never Smoker  . Smokeless tobacco: Never Used  . Alcohol use 4.2 oz/week    7 Glasses of wine per week     Comment: wine  . Drug use: No  . Sexual activity: Not Asked   Other Topics Concern  . None   Social History Narrative   Regular exercise , walking 4-5 times a week   Diet fruits and veggies, water   Allergies  Allergen Reactions  . Simvastatin     Elevated lfts?   Family History  Problem Relation Age of Onset  . Other Mother     tachycardia.Marland KitchenMarland Kitchen?afib  . Stroke Father     after hernia suegery  . Prostate cancer Father   . Other Father     global transient amnesia, unclear source  . Atrial fibrillation Sister   . Healthy Brother   . Healthy  Brother   . Coronary artery disease Paternal Grandmother   . Heart attack Paternal Grandmother 78  . Brain cancer Maternal Grandfather     ?  Marland Kitchen Cancer Paternal Grandfather     ?  Marland Kitchen Breast cancer Maternal Grandmother     Past medical history, social, surgical and family history all reviewed in electronic medical record.  No pertanent information unless stated regarding to the chief complaint.   Review of Systems: No headache, visual changes, nausea, vomiting, diarrhea, constipation, dizziness, abdominal pain, skin rash, fevers, chills, night sweats, weight loss, swollen lymph nodes, body aches, joint  swelling, muscle aches, chest pain, shortness of breath, mood changes.   Objective  Blood pressure (!) 156/82, pulse (!) 102, weight 192 lb (87.1 kg).  General: No apparent distress alert and oriented x3 mood and affect normal, dressed appropriately.  HEENT: Pupils equal, extraocular movements intact  Respiratory: Patient's speak in full sentences and does not appear short of breath  Cardiovascular: No lower extremity edema, non tender, no erythema  Skin: Warm dry intact with no signs of infection or rash on extremities or on axial skeleton.  Abdomen: Soft nontender  Neuro: Cranial nerves II through XII are intact, neurovascularly intact in all extremities with 2+ DTRs and 2+ pulses.  Lymph: No lymphadenopathy of posterior or anterior cervical chain or axillae bilaterally.  Gait normal with good balance and coordination.  MSK:  Non tender with full range of motion and good stability and symmetric strength and tone of shoulders, elbows, wrist, hip, knee and ankles bilaterally.   Neck exam :  Neck: Inspection unremarkable. No palpable stepoffs. Very mild tightness with right-sided rotation but negative Spurling's Grip strength and sensation normal in bilateral hands Strength good C4 to T1 distribution No sensory change to C4 to T1 Negative Hoffman sign bilaterally Reflexes normal  Symptom score? (22)      10 Severity score ? (132)   88    Oirentation?(5)   5 M / D/ W / Y /  Time  Immediate memory ?  5 pt each series Cat  ..........Marland KitchenCandle .... Baby Apple ........Marland KitchenPaper...... .Monkey Orange...Marland KitchenMarland KitchenMarland Kitchen Sugar..... Perfume Ball...........Marland Kitchen Wagon ... [employer]..........Marland Kitchen  Insect ..... Iron     Trial #1. (5)  5      Trial #2  (5)  5      Trial #3  (5)  5   TOTAL points: (15)  15       Concentration ? (5) 2 Digits backward: 1 point each series  2-5-3          (3-5-2) 7-1-6-4       (4-6-1-7)  4-0-3-8        (8-3-0-4) 2-5-3-8-0     (0-8-3-5-2)  Reverse months (1) 1  Balance  (maximum is 10 errors per stance) Non -Dominant foot is         5  (Test with non dominant foot, for 20 sec each, record # errors and add)  # of errors in Double leg stance   2 # of errors in Single leg stance     2 # of errors in Tandem stance        1 TOTAL # errors for balance testing (30) 15  Co-ordination score (1) 1  5 correct FNF repositioning in < 4 seconds = 1  Delayed recall: ? (5)   4  Vestibular neuro testing does cause patient I have nausea and headache immediately.     Impression and Recommendations:  This case required medical decision making of moderate complexity.      Note: This dictation was prepared with Dragon dictation along with smaller phrase technology. Any transcriptional errors that result from this process are unintentional.

## 2015-12-02 ENCOUNTER — Encounter: Payer: Self-pay | Admitting: Family Medicine

## 2015-12-02 ENCOUNTER — Ambulatory Visit (INDEPENDENT_AMBULATORY_CARE_PROVIDER_SITE_OTHER): Payer: 59 | Admitting: Family Medicine

## 2015-12-02 DIAGNOSIS — F0781 Postconcussional syndrome: Secondary | ICD-10-CM

## 2015-12-02 NOTE — Patient Instructions (Signed)
  Good to see you Start increasing screen time 5 minutes a day  OK to walk 10 minutes a day and no headache increase 2 minutes a day but wear hat and sunglasses.  To help improve COGNITIVE function: 3 tabs TWICE DAILY  for the next 3 days, then 3 tabs ONCE DAILY  for the next 10 days   Iron 65mg  daily with 500mg  of vitamin C                                                                                            To help with INSOMNIA: Tart cherry extract at night any dose.      Other medicines to help decrease inflammation Turmeric 500mg  twice daily  I want to see you again in 1 week OK to double book

## 2015-12-02 NOTE — Assessment & Plan Note (Signed)
I believe the patient is unfortunately continued to be symptomatic. At this moment patient is able to stay out of work for one more week. We discussed the over-the-counter medications and we didn't titrate down on the recommendations. In addition of this I like her to discontinue the nortriptyline. We discussed icing regimen any type of pain otherwise. Patient will monitor for any other symptoms. I believe that other deficiencies that she's had previously including B12 and iron could also be contributing. We will have patient follow-up in one week and hopefully we'll be able to release her at that time.

## 2015-12-06 NOTE — Progress Notes (Deleted)
[name] [address] [place], [place] [address] Phone: [phone] Subjective:    I'm seeing this patient by the request  of:  Jaffe  CC: concussion f/u   Update:   [id number]  Mallory Stewart is a 55 y.o. female coming in with complaint of concussion.  Has had some trouble.  Patient did have closed head injury on 10/20/2015. She initially tripped and or carotid falling backwards tracking the back of her head on concrete. She does not rib or any true loss of consciousness. Had significant fibrinous and finding words difficulty. Had a right-sided headache that seemed to be significantly worse radiating towards her thigh. Went to the emergency room 3 days later. Independently visualized patient's CT of her head which was unremarkable. Patient went to work and unfortunately was unable to continue secondary to headache and word finding difficult he. Other factors seem to be driving and staring in a screen for a long amount of time. Patient did see Dr. Dallas Schimke who put her on short-term disability and nortriptyline 100 mg at bedtime which didn't seem to help her headache but unfortunately continues to have memory problems. Patient still has intermittent headaches. Once to return to work workup but is concerned if she will be able to do it or not.patient denies any associated nausea, vomiting, any weight loss recently. Patient denies any type of visual disturbances with any of the headaches. Patient did see neurology and didn't discuss over-the-counter natural supplementations which is something we usually recommend as well.  Patient states overall she has made improvement but continues to have difficulty with mine processing. Seems like in the morning seems to be worse and take some time. She seems to be in a regular routine she seems to do better. Still having intermittent headaches. Neuro was having headaches previous to the injury. Patient denies any other  visual changes. Other than that she feels like herself most of the time. Was able to read yesterday for 30 minutes without any significant headaches     Past Medical History:  Diagnosis Date  . Diabetes ([facility])   . Hypertension    Past Surgical History:  Procedure Laterality Date  . LEEP  1990's  . ORIF FOOT FRACTURE  09/2008   L 5th metatarsal  . REFRACTIVE SURGERY  2000  . TONSILLECTOMY     Social History   Social History  . Marital status: Married    Spouse name: N/A  . Number of children: N/A  . Years of education: N/A   Occupational History  . Director of business development    Social History Main Topics  . Smoking status: Never Smoker  . Smokeless tobacco: Never Used  . Alcohol use 4.2 oz/week    7 Glasses of wine per week     Comment: wine  . Drug use: No  . Sexual activity: Not on file   Other Topics Concern  . Not on file   Social History Narrative   Regular exercise , walking 4-5 times a week   Diet fruits and veggies, water   Allergies  Allergen Reactions  . Simvastatin     Elevated lfts?   Family History  Problem Relation Age of Onset  . Other Mother     tachycardia.Marland KitchenMarland Kitchen?afib  . Stroke Father     after hernia suegery  . Prostate cancer Father   . Other Father     global transient amnesia, unclear source  . Atrial fibrillation Sister   .  Healthy Brother   . Healthy Brother   . Coronary artery disease Paternal Grandmother   . Heart attack Paternal Grandmother 39  . Brain cancer Maternal Grandfather     ?  Marland Kitchen Cancer Paternal Grandfather     ?  Marland Kitchen Breast cancer Maternal Grandmother     Past medical history, social, surgical and family history all reviewed in electronic medical record.  No pertanent information unless stated regarding to the chief complaint.   Review of Systems: No headache, visual changes, nausea, vomiting, diarrhea, constipation, dizziness, abdominal pain, skin rash, fevers, chills, night sweats, weight loss, swollen lymph  nodes, body aches, joint swelling, muscle aches, chest pain, shortness of breath, mood changes.   Objective  There were no vitals taken for this visit.  General: No apparent distress alert and oriented x3 mood and affect normal, dressed appropriately.  HEENT: Pupils equal, extraocular movements intact  Respiratory: Patient's speak in full sentences and does not appear short of breath  Cardiovascular: No lower extremity edema, non tender, no erythema  Skin: Warm dry intact with no signs of infection or rash on extremities or on axial skeleton.  Abdomen: Soft nontender  Neuro: Cranial nerves II through XII are intact, neurovascularly intact in all extremities with 2+ DTRs and 2+ pulses.  Lymph: No lymphadenopathy of posterior or anterior cervical chain or axillae bilaterally.  Gait normal with good balance and coordination.  MSK:  Non tender with full range of motion and good stability and symmetric strength and tone of shoulders, elbows, wrist, hip, knee and ankles bilaterally.   Neck exam :  Neck: Inspection unremarkable. No palpable stepoffs. Very mild tightness with right-sided rotation but negative Spurling's Grip strength and sensation normal in bilateral hands Strength good C4 to T1 distribution No sensory change to C4 to T1 Negative Hoffman sign bilaterally Reflexes normal  Symptom score? (22)      10 Severity score ? (132)   88    Oirentation?(5)   5 M / D/ W / Y /  Time  Immediate memory ?  5 pt each series Cat  ..........Marland KitchenCandle .... Baby Apple ........Marland KitchenPaper...... .Monkey Orange...Marland KitchenMarland KitchenMarland Kitchen Sugar..... Perfume Ball...........Marland Kitchen Wagon ... [employer]..........Marland Kitchen  Insect ..... Iron     Trial #1. (5)  5      Trial #2  (5)  5      Trial #3  (5)  5   TOTAL points: (15)  15       Concentration ? (5) 2 Digits backward: 1 point each series  2-5-3          (3-5-2) 7-1-6-4       (4-6-1-7)  4-0-3-8        (8-3-0-4) 2-5-3-8-0     (0-8-3-5-2)  Reverse months (1) 1  Balance  (maximum is 10 errors per stance) Non -Dominant foot is         5  (Test with non dominant foot, for 20 sec each, record # errors and add)  # of errors in Double leg stance   2 # of errors in Single leg stance     2 # of errors in Tandem stance        1 TOTAL # errors for balance testing (30) 15  Co-ordination score (1) 1  5 correct FNF repositioning in < 4 seconds = 1  Delayed recall: ? (5)   4  Vestibular neuro testing does cause patient I have nausea and headache immediately.     Impression and Recommendations:  This case required medical decision making of moderate complexity.      Note: This dictation was prepared with Dragon dictation along with smaller phrase technology. Any transcriptional errors that result from this process are unintentional.

## 2015-12-07 ENCOUNTER — Ambulatory Visit: Payer: 59 | Admitting: Family Medicine

## 2015-12-08 NOTE — Progress Notes (Signed)
[name] [address] [place], [place] [address] Phone: [phone] Subjective:    I'm seeing this patient by the request  of:  Everlena Cooper  CC: concussion f/u   [id number]  Mallory Stewart is a 55 y.o. female coming in with complaint of concussion.  Has had some trouble.  Patient did have closed head injury on 10/20/2015. She initially tripped and or carotid falling backwards tracking the back of her head on concrete. She does not rib or any true loss of consciousness. Had significant fibrinous and finding words difficulty. Had a right-sided headache that seemed to be significantly worse radiating towards her thigh. Went to the emergency room 3 days later. Independently visualized patient's CT of her head which was unremarkable. Patient went to work and unfortunately was unable to continue secondary to headache and word finding difficult he. Other factors seem to be driving and staring in a screen for a long amount of time. Patient did see Dr. Dallas Schimke who put her on short-term disability and nortriptyline 100 mg at bedtime which didn't seem to help her headache but unfortunately continues to have memory problems. Patient still has intermittent headaches. Once to return to work workup but is concerned if she will be able to do it or not.patient denies any associated nausea, vomiting, any weight loss recently. Patient denies any type of visual disturbances with any of the headaches. Patient did see neurology and didn't discuss over-the-counter natural supplementations which is something we usually recommend as well.   8/4 update-  Last seen 1 week ago. Patient did take some natural over-the-counter medications as well as to discontinue the nortriptyline of that was causing some of the confusion the patient was still having. Patient states She is feeling significantly better. Feels like she is doing very well except for the word finding. Still has some mild discomfort.  Nothing that is severe though. Patient is happy with the results of far. Feels like she would be able to work at this time. Patient feel stopping the amitriptyline has been beneficial as well     Past Medical History:  Diagnosis Date  . Diabetes (HCC)   . Hypertension    Past Surgical History:  Procedure Laterality Date  . LEEP  1990's  . ORIF FOOT FRACTURE  09/2008   L 5th metatarsal  . REFRACTIVE SURGERY  2000  . TONSILLECTOMY     Social History   Social History  . Marital status: Married    Spouse name: N/A  . Number of children: N/A  . Years of education: N/A   Occupational History  . Director of business development    Social History Main Topics  . Smoking status: Never Smoker  . Smokeless tobacco: Never Used  . Alcohol use 4.2 oz/week    7 Glasses of wine per week     Comment: wine  . Drug use: No  . Sexual activity: Not Asked   Other Topics Concern  . None   Social History Narrative   Regular exercise , walking 4-5 times a week   Diet fruits and veggies, water   Allergies  Allergen Reactions  . Simvastatin     Elevated lfts?   Family History  Problem Relation Age of Onset  . Other Mother     tachycardia.Marland KitchenMarland Kitchen?afib  . Stroke Father     after hernia suegery  . Prostate cancer Father   . Other Father     global transient amnesia, unclear source  .  Atrial fibrillation Sister   . Healthy Brother   . Healthy Brother   . Coronary artery disease Paternal Grandmother   . Heart attack Paternal Grandmother 21  . Brain cancer Maternal Grandfather     ?  Marland Kitchen Cancer Paternal Grandfather     ?  Marland Kitchen Breast cancer Maternal Grandmother     Past medical history, social, surgical and family history all reviewed in electronic medical record.  No pertanent information unless stated regarding to the chief complaint.   Review of Systems: No headache, visual changes, nausea, vomiting, diarrhea, constipation, dizziness, abdominal pain, skin rash, fevers, chills, night  sweats, weight loss, swollen lymph nodes, body aches, joint swelling, muscle aches, chest pain, shortness of breath, mood changes.   Objective  Blood pressure 138/84, pulse (!) 105, weight 190 lb (86.2 kg), SpO2 97 %.  General: No apparent distress alert and oriented x3 mood and affect normal, dressed appropriately.  HEENT: Pupils equal, extraocular movements intact  Respiratory: Patient's speak in full sentences and does not appear short of breath  Cardiovascular: No lower extremity edema, non tender, no erythema  Skin: Warm dry intact with no signs of infection or rash on extremities or on axial skeleton.  Abdomen: Soft nontender  Neuro: Cranial nerves II through XII are intact, neurovascularly intact in all extremities with 2+ DTRs and 2+ pulses.  Lymph: No lymphadenopathy of posterior or anterior cervical chain or axillae bilaterally.  Gait normal with good balance and coordination.  MSK:  Non tender with full range of motion and good stability and symmetric strength and tone of shoulders, elbows, wrist, hip, knee and ankles bilaterally.   Neck exam :  Neck: Inspection unremarkable. No palpable stepoffs. No tenderness Grip strength and sensation normal in bilateral hands Strength good C4 to T1 distribution No sensory change to C4 to T1 Negative Hoffman sign bilaterally Reflexes normal  Symptom score? (22)      5 Severity score ? (132)   40    Oirentation?(5)   5 M / D/ W / Y /  Time  Immediate memory ?  5 pt each series Cat  ..........Marland KitchenCandle .... Baby Apple ........Marland KitchenPaper...... .Monkey Orange...Marland KitchenMarland KitchenMarland Kitchen Sugar..... Perfume Ball...........Marland Kitchen Wagon ... [employer]..........Marland Kitchen  Insect ..... Iron     Trial #1. (5)  5      Trial #2  (5)  5      Trial #3  (5)  5   TOTAL points: (15)  15       Concentration ? (5) 5 Digits backward: 1 point each series  2-5-3          (3-5-2) 7-1-6-4       (4-6-1-7)  4-0-3-8        (8-3-0-4) 2-5-3-8-0     (0-8-3-5-2)  Reverse months (1)  1  Balance (maximum is 10 errors per stance) Non -Dominant foot is         5  (Test with non dominant foot, for 20 sec each, record # errors and add)  # of errors in Double leg stance   1 # of errors in Single leg stance     1 # of errors in Tandem stance        0 TOTAL # errors for balance testing (30) 28  Co-ordination score (1) 1  5 correct FNF repositioning in < 4 seconds = 1  Delayed recall: ? (5)   5  Vestibular neuro testing does cause patient I have nausea and headache immediately.  Impression and Recommendations:     This case required medical decision making of moderate complexity.      Note: This dictation was prepared with Dragon dictation along with smaller phrase technology. Any transcriptional errors that result from this process are unintentional.

## 2015-12-09 ENCOUNTER — Ambulatory Visit (INDEPENDENT_AMBULATORY_CARE_PROVIDER_SITE_OTHER): Payer: 59 | Admitting: Family Medicine

## 2015-12-09 ENCOUNTER — Encounter: Payer: Self-pay | Admitting: Family Medicine

## 2015-12-09 ENCOUNTER — Encounter: Payer: Self-pay | Admitting: *Deleted

## 2015-12-09 DIAGNOSIS — F0781 Postconcussional syndrome: Secondary | ICD-10-CM | POA: Diagnosis not present

## 2015-12-09 NOTE — Assessment & Plan Note (Signed)
Patient does have more of a postconcussive syndrome that seemed to have resolved at this time. At this time encouraged her to continue the potential iron as well as the fish well. Patient states that it didn't upset her stomach some. We will continue to monitor. Patient should do well and has given a note to start work next week. Mallory Stewart patient will respond well. Patient follow-up again with me in 2 weeks for further evaluation and likely complete release.

## 2015-12-09 NOTE — Patient Instructions (Addendum)
Great to see you  I think you are doing great  Choline 500mg  daily  Iron 65mg  with 500mg  of vitamin C 3 times a week can help See me in 2 weeks if not perfect      HOME EPLEY MANEUVER (the best home treatment maneuver)- google for pictures  home crp The Epley and/or Semont maneuvers can be done at home (Radke et al, 1999; Radke et al, 2004; Princeton and Dilworth, 2004). We often recommend the home-Epley to our patients who have a clear diagnosis. This procedure seems to be even more effective than the in-office procedure, perhaps because it is repeated every night for a week. At this writing (2015) there are many home maneuvers. As there is only one way to move things around in a circle, they all boil down to the same head positions - -just different ways of getting there. The Epley maneuver is the best established.  The home Epley method (for the left side) is performed as shown on the figure to the right. The maneuver for the right side is just the mirror image.  One stays in each of the supine (lying down) positions for 30 seconds, and in the sitting upright position (top) for 1 minute. Thus, once cycle takes 2 1/2 minutes. Typically 3 cycles are performed just prior to going to sleep. It is best to do them at night rather than in the morning or midday, as if one becomes dizzy following the exercises, then it can resolve while one is sleeping.

## 2015-12-15 ENCOUNTER — Telehealth: Payer: Self-pay | Admitting: Family Medicine

## 2015-12-15 NOTE — Telephone Encounter (Signed)
Copy left at front desk for pt to come by & pick up.

## 2015-12-15 NOTE — Telephone Encounter (Signed)
Patient is requesting that we place a copy of her FMLA up in the cabinet for pick up

## 2015-12-19 ENCOUNTER — Other Ambulatory Visit: Payer: 59

## 2015-12-20 ENCOUNTER — Other Ambulatory Visit: Payer: Self-pay | Admitting: Family Medicine

## 2015-12-23 ENCOUNTER — Ambulatory Visit: Payer: 59 | Admitting: Family Medicine

## 2015-12-29 ENCOUNTER — Ambulatory Visit: Payer: 59 | Admitting: Family Medicine

## 2016-01-10 ENCOUNTER — Other Ambulatory Visit: Payer: Self-pay

## 2016-01-10 MED ORDER — OXYCODONE-ACETAMINOPHEN 5-325 MG PO TABS: 1.0000 | ORAL_TABLET | Freq: Three times a day (TID) | ORAL | 0 refills | Status: DC

## 2016-01-10 NOTE — Telephone Encounter (Signed)
Pt left /vm requesting rx oxycodone apap. Call when ready for pick up. Last printed # 90 on 11/29/15. Last seen 09/22/15 for annual.Please advise.

## 2016-01-11 NOTE — Telephone Encounter (Signed)
Prescription placed in Dr. Bedsole's in box for signature. 

## 2016-01-12 NOTE — Telephone Encounter (Signed)
Mallory Stewart notified prescription is ready to be picked up at the front desk.

## 2016-01-16 ENCOUNTER — Other Ambulatory Visit: Payer: Self-pay | Admitting: Family Medicine

## 2016-02-06 ENCOUNTER — Other Ambulatory Visit: Payer: 59

## 2016-02-09 ENCOUNTER — Ambulatory Visit: Payer: 59 | Admitting: Family Medicine

## 2016-02-13 ENCOUNTER — Other Ambulatory Visit: Payer: 59

## 2016-02-13 ENCOUNTER — Other Ambulatory Visit: Payer: Self-pay

## 2016-02-13 ENCOUNTER — Telehealth: Payer: Self-pay | Admitting: Family Medicine

## 2016-02-13 DIAGNOSIS — E119 Type 2 diabetes mellitus without complications: Secondary | ICD-10-CM

## 2016-02-13 NOTE — Telephone Encounter (Signed)
Pt left v/m requesting rx oxycodone apap. Call when ready for pick up. Last printed # 90 on 01/10/16. Last annual 09/22/15.

## 2016-02-13 NOTE — Telephone Encounter (Signed)
-----   Message from Baldomero LamyNatasha C Chavers sent at 02/03/2016  3:31 PM EDT ----- Regarding: F/u labs Mon 10/9, need orders. Thanks :-) Please order  future f/u labs for pt's upcoming lab appt. Thanks Rodney Boozeasha

## 2016-02-14 MED ORDER — OXYCODONE-ACETAMINOPHEN 5-325 MG PO TABS: 1.0000 | ORAL_TABLET | Freq: Three times a day (TID) | ORAL | 0 refills | Status: DC

## 2016-02-14 NOTE — Telephone Encounter (Signed)
Mallory Stewart notified that her prescription is ready to be picked up at the front desk via voicemail on mobile phone.

## 2016-02-17 ENCOUNTER — Ambulatory Visit: Payer: 59 | Admitting: Family Medicine

## 2016-02-27 ENCOUNTER — Other Ambulatory Visit (INDEPENDENT_AMBULATORY_CARE_PROVIDER_SITE_OTHER): Payer: 59

## 2016-02-27 DIAGNOSIS — E119 Type 2 diabetes mellitus without complications: Secondary | ICD-10-CM

## 2016-02-27 LAB — LIPID PANEL
CHOLESTEROL: 237 mg/dL — AB (ref 0–200)
HDL: 69 mg/dL (ref >39.00)
LDL CALC: 138 mg/dL — AB (ref 0–99)
NonHDL: 168.26
Total CHOL/HDL Ratio: 3
Triglycerides: 150 mg/dL — ABNORMAL HIGH (ref 0.0–149.0)
VLDL: 30 mg/dL (ref 0.0–40.0)

## 2016-02-27 LAB — COMPREHENSIVE METABOLIC PANEL
ALT: 32 U/L (ref 0–35)
AST: 35 U/L (ref 0–37)
Albumin: 4.5 g/dL (ref 3.5–5.2)
Alkaline Phosphatase: 48 U/L (ref 39–117)
BUN: 11 mg/dL (ref 6–23)
CALCIUM: 9.7 mg/dL (ref 8.4–10.5)
CHLORIDE: 102 meq/L (ref 96–112)
CO2: 29 meq/L (ref 19–32)
CREATININE: 0.82 mg/dL (ref 0.40–1.20)
GFR: 77.48 mL/min (ref >60.00)
Glucose, Bld: 141 mg/dL — ABNORMAL HIGH (ref 70–99)
Potassium: 5.3 mEq/L — ABNORMAL HIGH (ref 3.5–5.1)
SODIUM: 139 meq/L (ref 135–145)
Total Bilirubin: 0.5 mg/dL (ref 0.2–1.2)
Total Protein: 6.9 g/dL (ref 6.0–8.3)

## 2016-02-27 LAB — HEMOGLOBIN A1C: Hgb A1c MFr Bld: 6 % (ref 4.6–6.5)

## 2016-03-02 ENCOUNTER — Encounter: Payer: Self-pay | Admitting: Family Medicine

## 2016-03-02 ENCOUNTER — Ambulatory Visit (INDEPENDENT_AMBULATORY_CARE_PROVIDER_SITE_OTHER): Payer: 59 | Admitting: Family Medicine

## 2016-03-02 VITALS — BP 140/94 | HR 103 | Temp 99.1°F | Ht 68.5 in | Wt 190.8 lb

## 2016-03-02 DIAGNOSIS — E875 Hyperkalemia: Secondary | ICD-10-CM | POA: Diagnosis not present

## 2016-03-02 DIAGNOSIS — I10 Essential (primary) hypertension: Secondary | ICD-10-CM

## 2016-03-02 DIAGNOSIS — E7800 Pure hypercholesterolemia, unspecified: Secondary | ICD-10-CM | POA: Diagnosis not present

## 2016-03-02 DIAGNOSIS — G5791 Unspecified mononeuropathy of right lower limb: Secondary | ICD-10-CM

## 2016-03-02 DIAGNOSIS — E119 Type 2 diabetes mellitus without complications: Secondary | ICD-10-CM

## 2016-03-02 DIAGNOSIS — Z23 Encounter for immunization: Secondary | ICD-10-CM | POA: Diagnosis not present

## 2016-03-02 DIAGNOSIS — G5793 Unspecified mononeuropathy of bilateral lower limbs: Secondary | ICD-10-CM

## 2016-03-02 DIAGNOSIS — G5792 Unspecified mononeuropathy of left lower limb: Secondary | ICD-10-CM

## 2016-03-02 LAB — BASIC METABOLIC PANEL
BUN: 17 mg/dL (ref 6–23)
CALCIUM: 9.8 mg/dL (ref 8.4–10.5)
CO2: 28 meq/L (ref 19–32)
CREATININE: 0.86 mg/dL (ref 0.40–1.20)
Chloride: 100 mEq/L (ref 96–112)
GFR: 73.33 mL/min (ref >60.00)
Glucose, Bld: 204 mg/dL — ABNORMAL HIGH (ref 70–99)
Potassium: 5 mEq/L (ref 3.5–5.1)
Sodium: 138 mEq/L (ref 135–145)

## 2016-03-02 LAB — HM DIABETES FOOT EXAM

## 2016-03-02 NOTE — Progress Notes (Signed)
Pre visit review using our clinic review tool, if applicable. No additional management support is needed unless otherwise documented below in the visit note. 

## 2016-03-02 NOTE — Progress Notes (Signed)
Subjective:    Patient ID: Mallory Stewart, female    DOB: [birth year], 55 y.o.   MRN: [id number]  HPI    55 year old female presents to follow up  DM, cholesterol and chronic neuropathic pain in legs.   She had a concussion.Marland Kitchen Has not been able to be active with this. She is now back to regular exercise in last 1-2 months.  Elevated Cholesterol:  Off statin given increase in LFTs. Working on diet changes. LDL 138.  LDL not at goal < 100 . Lab Results  Component Value Date   CHOL 237 (H) 02/27/2016   HDL 69.00 02/27/2016   LDLCALC 138 (H) 02/27/2016   LDLDIRECT 160.5 02/20/2012   TRIG 150.0 (H) 02/27/2016   CHOLHDL 3 02/27/2016  Using medications without problems: Muscle aches:  Diet compliance: Great. Exercise: Other complaints:  Diabetes:   Stable control  On metformin.  She has started gluten free diet. Not snacking as much. Lab Results  Component Value Date   HGBA1C 6.0 02/27/2016    Using medications without difficulties: Hypoglycemic episodes: none Hyperglycemic episodes:None Feet problems: None Blood Sugars averaging: 100-130 eye exam within last year: Wt Readings from Last 3 Encounters:  03/02/16 190 lb 12 oz (86.5 kg)  12/09/15 190 lb (86.2 kg)  12/02/15 192 lb (87.1 kg)    Chronic pain: may be slightly better with gluten free diet.  On gabapentin 300 mg. Using 2-3 oxycodone at night for pain.  Potassium found to be high at last check: redrawn today for re-eval.  She is on ACEI.  Was increased to 40 mg  In 10/2015.  Has white coat HTN, has been 120/70s at home on higher dose.  Was on Brat diet 2 weeks ago. BP Readings from Last 3 Encounters:  03/02/16 (!) 140/94  12/09/15 138/84  12/02/15 (!) 156/82       Review of Systems  Constitutional: Negative for fatigue and fever.  HENT: Negative for ear pain.   Eyes: Negative for pain.  Respiratory: Negative for chest tightness and shortness of breath.   Cardiovascular: Negative for chest pain,  palpitations and leg swelling.  Gastrointestinal: Negative for abdominal pain.  Genitourinary: Negative for dysuria.       Objective:   Physical Exam  Constitutional: Vital signs are normal. She appears well-developed and well-nourished. She is cooperative.  Non-toxic appearance. She does not appear ill. No distress.  HENT:  Head: Normocephalic.  Right Ear: Hearing, tympanic membrane, external ear and ear canal normal. Tympanic membrane is not erythematous, not retracted and not bulging.  Left Ear: Hearing, tympanic membrane, external ear and ear canal normal. Tympanic membrane is not erythematous, not retracted and not bulging.  Nose: No mucosal edema or rhinorrhea. Right sinus exhibits no maxillary sinus tenderness and no frontal sinus tenderness. Left sinus exhibits no maxillary sinus tenderness and no frontal sinus tenderness.  Mouth/Throat: Uvula is midline, oropharynx is clear and moist and mucous membranes are normal.  Eyes: Conjunctivae, EOM and lids are normal. Pupils are equal, round, and reactive to light. Lids are everted and swept, no foreign bodies found.  Neck: Trachea normal and normal range of motion. Neck supple. Carotid bruit is not present. No thyroid mass and no thyromegaly present.  Cardiovascular: Normal rate, regular rhythm, S1 normal, S2 normal, normal heart sounds, intact distal pulses and normal pulses.  Exam reveals no gallop and no friction rub.   No murmur heard. Pulmonary/Chest: Effort normal and breath sounds normal. No tachypnea. No  respiratory distress. She has no decreased breath sounds. She has no wheezes. She has no rhonchi. She has no rales.  Abdominal: Soft. Normal appearance and bowel sounds are normal. There is no tenderness.  Neurological: She is alert.  Skin: Skin is warm, dry and intact. No rash noted.  Psychiatric: Her speech is normal and behavior is normal. Judgment and thought content normal. Her mood appears not anxious. Cognition and memory are  normal. She does not exhibit a depressed mood.    Diabetic foot exam: Normal inspection No skin breakdown No calluses  Normal DP pulses Decreased sensation to light touch and monofilament up to mid calf bialterally Nails normal       Assessment & Plan:

## 2016-03-02 NOTE — Patient Instructions (Signed)
Work on [employer] chol diet.  We will call with lab results for potassium.

## 2016-03-02 NOTE — Assessment & Plan Note (Signed)
Re-eval. Likely due to diet and ACEI.

## 2016-03-02 NOTE — Assessment & Plan Note (Signed)
Well controlled. Continue current medication.  

## 2016-03-02 NOTE — Assessment & Plan Note (Signed)
Improved LDL, not at goal. Pt will continue working on health eating and regular exercise.

## 2016-03-02 NOTE — Assessment & Plan Note (Signed)
Seems to be improving with gluten free diet. Continue gabapentin.

## 2016-03-26 ENCOUNTER — Other Ambulatory Visit: Payer: Self-pay

## 2016-03-26 NOTE — Telephone Encounter (Signed)
Pt left /vm requesting rx for oxycodone apap. Call when ready for pick up. Last printed # 90 on 02/14/16. Last seen 03/02/16.

## 2016-03-27 MED ORDER — OXYCODONE-ACETAMINOPHEN 5-325 MG PO TABS: 1.0000 | ORAL_TABLET | Freq: Three times a day (TID) | ORAL | 0 refills | Status: DC

## 2016-03-27 NOTE — Telephone Encounter (Signed)
Jae DireKate notified that her prescription is ready to be picked up at the front desk.

## 2016-05-01 ENCOUNTER — Other Ambulatory Visit: Payer: Self-pay

## 2016-05-01 MED ORDER — OXYCODONE-ACETAMINOPHEN 5-325 MG PO TABS: 1.0000 | ORAL_TABLET | Freq: Three times a day (TID) | ORAL | 0 refills | Status: DC

## 2016-05-01 NOTE — Telephone Encounter (Signed)
Pt left v/m requesting rx oxycodone apap. Call when ready for pick up. [place]16 at 6 AM and request to pick up rx today or 05/02/16. Last printed # 90 on 03/27/16  And last seen 03/02/16.

## 2016-05-01 NOTE — Telephone Encounter (Signed)
Spoken to patient and notified that prescription is ready for pick up. Left in the front office.

## 2016-06-18 ENCOUNTER — Other Ambulatory Visit: Payer: Self-pay

## 2016-06-18 NOTE — Telephone Encounter (Signed)
Pt left a message on Triage line for refill of Oxycodone. Last written 05-01-16 #90 Last OV 03-02-16 NextOV 09-14-16

## 2016-06-19 MED ORDER — OXYCODONE-ACETAMINOPHEN 5-325 MG PO TABS: 1.0000 | ORAL_TABLET | Freq: Three times a day (TID) | ORAL | 0 refills | Status: DC

## 2016-06-19 NOTE — Telephone Encounter (Signed)
Mallory Stewart notified by telephone that her prescription is ready to be picked up at the front desk.

## 2016-07-03 ENCOUNTER — Other Ambulatory Visit: Payer: Self-pay | Admitting: Family Medicine

## 2016-07-19 ENCOUNTER — Telehealth: Payer: Self-pay

## 2016-07-19 NOTE — Telephone Encounter (Signed)
Pt saw advertisement in newspaper about do you suffer from neuropathy?and pt wants Dr Daphine DeutscherBedsole's opinion if worthwhile for pt to do before pt contacts them about purchasing. The website is [email or web address] . Pt request cb after Dr Ermalene SearingBedsole reviews.

## 2016-07-23 NOTE — Telephone Encounter (Signed)
[name] notified as instructed by telephone.

## 2016-07-23 NOTE — Telephone Encounter (Signed)
Let pt know.. I am not familiar with this group in particular but it looks like alternative medicine treatments from biofeedback to laser treatment ? to nutrition.   I cannot say that any of these treatments have been studied in a controlled format and shown to help neuropathy, but  they also have just not been studied intensively. I am not aware of any harm. Except can be expensive and not covered by insurance.  Hope this helps.

## 2016-07-26 ENCOUNTER — Encounter (HOSPITAL_COMMUNITY): Payer: Self-pay | Admitting: Emergency Medicine

## 2016-07-26 ENCOUNTER — Emergency Department (HOSPITAL_COMMUNITY): Payer: 59

## 2016-07-26 ENCOUNTER — Inpatient Hospital Stay (HOSPITAL_COMMUNITY)
Admission: EM | Admit: 2016-07-26 | Discharge: 2016-07-30 | DRG: 418 | Disposition: A | Discharge status: Home / Self Care | Payer: 59 | Attending: Internal Medicine | Admitting: Internal Medicine

## 2016-07-26 ENCOUNTER — Inpatient Hospital Stay (HOSPITAL_COMMUNITY): Payer: 59

## 2016-07-26 ENCOUNTER — Other Ambulatory Visit (HOSPITAL_COMMUNITY): Payer: 59

## 2016-07-26 DIAGNOSIS — G5791 Unspecified mononeuropathy of right lower limb: Secondary | ICD-10-CM | POA: Diagnosis not present

## 2016-07-26 DIAGNOSIS — K8010 Calculus of gallbladder with chronic cholecystitis without obstruction: Secondary | ICD-10-CM | POA: Diagnosis present

## 2016-07-26 DIAGNOSIS — Z79899 Other long term (current) drug therapy: Secondary | ICD-10-CM | POA: Diagnosis not present

## 2016-07-26 DIAGNOSIS — I160 Hypertensive urgency: Secondary | ICD-10-CM | POA: Diagnosis present

## 2016-07-26 DIAGNOSIS — I152 Hypertension secondary to endocrine disorders: Secondary | ICD-10-CM | POA: Diagnosis present

## 2016-07-26 DIAGNOSIS — E119 Type 2 diabetes mellitus without complications: Secondary | ICD-10-CM

## 2016-07-26 DIAGNOSIS — Z419 Encounter for procedure for purposes other than remedying health state, unspecified: Secondary | ICD-10-CM

## 2016-07-26 DIAGNOSIS — Z7984 Long term (current) use of oral hypoglycemic drugs: Secondary | ICD-10-CM | POA: Diagnosis not present

## 2016-07-26 DIAGNOSIS — K851 Biliary acute pancreatitis without necrosis or infection: Secondary | ICD-10-CM | POA: Diagnosis not present

## 2016-07-26 DIAGNOSIS — Z79891 Long term (current) use of opiate analgesic: Secondary | ICD-10-CM | POA: Diagnosis not present

## 2016-07-26 DIAGNOSIS — M549 Dorsalgia, unspecified: Secondary | ICD-10-CM | POA: Diagnosis not present

## 2016-07-26 DIAGNOSIS — G5793 Unspecified mononeuropathy of bilateral lower limbs: Secondary | ICD-10-CM | POA: Diagnosis present

## 2016-07-26 DIAGNOSIS — K8020 Calculus of gallbladder without cholecystitis without obstruction: Secondary | ICD-10-CM | POA: Diagnosis not present

## 2016-07-26 DIAGNOSIS — E1140 Type 2 diabetes mellitus with diabetic neuropathy, unspecified: Secondary | ICD-10-CM | POA: Diagnosis present

## 2016-07-26 DIAGNOSIS — E1159 Type 2 diabetes mellitus with other circulatory complications: Secondary | ICD-10-CM | POA: Diagnosis present

## 2016-07-26 DIAGNOSIS — G8929 Other chronic pain: Secondary | ICD-10-CM | POA: Diagnosis present

## 2016-07-26 DIAGNOSIS — E785 Hyperlipidemia, unspecified: Secondary | ICD-10-CM | POA: Diagnosis present

## 2016-07-26 DIAGNOSIS — G5792 Unspecified mononeuropathy of left lower limb: Secondary | ICD-10-CM

## 2016-07-26 DIAGNOSIS — K859 Acute pancreatitis without necrosis or infection, unspecified: Secondary | ICD-10-CM | POA: Diagnosis present

## 2016-07-26 DIAGNOSIS — R109 Unspecified abdominal pain: Secondary | ICD-10-CM | POA: Diagnosis present

## 2016-07-26 DIAGNOSIS — I10 Essential (primary) hypertension: Secondary | ICD-10-CM | POA: Diagnosis present

## 2016-07-26 LAB — CBC
HCT: 39 % (ref 36.0–46.0)
Hemoglobin: 13.1 g/dL (ref 12.0–15.0)
MCH: 32.6 pg (ref 26.0–34.0)
MCHC: 33.6 g/dL (ref 30.0–36.0)
MCV: 97 fL (ref 78.0–100.0)
PLATELETS: 137 10*3/uL — AB (ref 150–400)
RBC: 4.02 MIL/uL (ref 3.87–5.11)
RDW: 13.2 % (ref 11.5–15.5)
WBC: 5.4 10*3/uL (ref 4.0–10.5)

## 2016-07-26 LAB — URINALYSIS, ROUTINE W REFLEX MICROSCOPIC
BILIRUBIN URINE: NEGATIVE
Glucose, UA: 50 mg/dL — AB
Hgb urine dipstick: NEGATIVE
KETONES UR: 5 mg/dL — AB
Leukocytes, UA: NEGATIVE
NITRITE: NEGATIVE
PH: 6 (ref 5.0–8.0)
PROTEIN: NEGATIVE mg/dL
Specific Gravity, Urine: >1.046 — ABNORMAL HIGH (ref 1.005–1.030)

## 2016-07-26 LAB — I-STAT TROPONIN, ED: TROPONIN I, POC: 0 ng/mL (ref 0.00–0.08)

## 2016-07-26 LAB — COMPREHENSIVE METABOLIC PANEL
ALBUMIN: 4.1 g/dL (ref 3.5–5.0)
ALK PHOS: 53 U/L (ref 38–126)
ALT: 40 U/L (ref 14–54)
AST: 60 U/L — AB (ref 15–41)
Anion gap: 17 — ABNORMAL HIGH (ref 5–15)
BILIRUBIN TOTAL: 0.9 mg/dL (ref 0.3–1.2)
CALCIUM: 9.4 mg/dL (ref 8.9–10.3)
CO2: 26 mmol/L (ref 22–32)
CREATININE: 0.76 mg/dL (ref 0.44–1.00)
Chloride: 99 mmol/L — ABNORMAL LOW (ref 101–111)
GFR calc Af Amer: >60 mL/min (ref >60)
Glucose, Bld: 231 mg/dL — ABNORMAL HIGH (ref 65–99)
Potassium: 4.6 mmol/L (ref 3.5–5.1)
Sodium: 142 mmol/L (ref 135–145)
TOTAL PROTEIN: 7.1 g/dL (ref 6.5–8.1)

## 2016-07-26 LAB — GLUCOSE, CAPILLARY: Glucose-Capillary: 231 mg/dL — ABNORMAL HIGH (ref 65–99)

## 2016-07-26 LAB — CBG MONITORING, ED: Glucose-Capillary: 207 mg/dL — ABNORMAL HIGH (ref 65–99)

## 2016-07-26 LAB — LIPASE, BLOOD: Lipase: 852 U/L — ABNORMAL HIGH (ref 11–51)

## 2016-07-26 MED ORDER — HYDRALAZINE HCL 20 MG/ML IJ SOLN
10.0000 mg | Freq: Once | INTRAMUSCULAR | Status: AC
  Administered 2016-07-26: 10 mg via INTRAVENOUS
  Filled 2016-07-26: qty 1

## 2016-07-26 MED ORDER — METOCLOPRAMIDE HCL 5 MG/ML IJ SOLN: 10.0000 mg | Freq: Once | INTRAMUSCULAR | Status: DC

## 2016-07-26 MED ORDER — FENTANYL CITRATE (PF) 100 MCG/2ML IJ SOLN
50.0000 ug | INTRAMUSCULAR | Status: DC | PRN
  Administered 2016-07-26: 50 ug via INTRAVENOUS
  Filled 2016-07-26: qty 2

## 2016-07-26 MED ORDER — LORAZEPAM 2 MG/ML IJ SOLN
1.0000 mg | Freq: Once | INTRAMUSCULAR | Status: AC
  Administered 2016-07-26: 1 mg via INTRAVENOUS
  Filled 2016-07-26: qty 1

## 2016-07-26 MED ORDER — HYDRALAZINE HCL 20 MG/ML IJ SOLN
10.0000 mg | INTRAMUSCULAR | Status: DC | PRN
  Administered 2016-07-27 – 2016-07-28 (×5): 10 mg via INTRAVENOUS
  Filled 2016-07-26 (×5): qty 1

## 2016-07-26 MED ORDER — FENTANYL 40 MCG/ML IV SOLN
INTRAVENOUS | Status: DC
  Administered 2016-07-27: 75 ug via INTRAVENOUS
  Administered 2016-07-27: via INTRAVENOUS
  Administered 2016-07-27: 300 ug via INTRAVENOUS
  Administered 2016-07-27: 45 ug via INTRAVENOUS
  Administered 2016-07-28: 60 ug via INTRAVENOUS
  Administered 2016-07-28: 150 ug via INTRAVENOUS
  Administered 2016-07-28: via INTRAVENOUS
  Filled 2016-07-26 (×3): qty 25

## 2016-07-26 MED ORDER — DIPHENHYDRAMINE HCL 12.5 MG/5ML PO ELIX: 12.5000 mg | ORAL_SOLUTION | Freq: Four times a day (QID) | ORAL | Status: DC | PRN

## 2016-07-26 MED ORDER — SODIUM CHLORIDE 0.9 % IV BOLUS (SEPSIS)
500.0000 mL | Freq: Once | INTRAVENOUS | Status: AC
  Administered 2016-07-26: 500 mL via INTRAVENOUS

## 2016-07-26 MED ORDER — GABAPENTIN 300 MG PO CAPS
300.0000 mg | ORAL_CAPSULE | Freq: Every day | ORAL | Status: DC
  Administered 2016-07-26 – 2016-07-29 (×4): 300 mg via ORAL
  Filled 2016-07-26 (×4): qty 1

## 2016-07-26 MED ORDER — ONDANSETRON HCL 4 MG/2ML IJ SOLN
4.0000 mg | Freq: Once | INTRAMUSCULAR | Status: DC
  Filled 2016-07-26: qty 2

## 2016-07-26 MED ORDER — SODIUM CHLORIDE 0.9% FLUSH: 9.0000 mL | INTRAVENOUS | Status: DC | PRN

## 2016-07-26 MED ORDER — HYDROMORPHONE HCL 1 MG/ML IJ SOLN: 1.0000 mg | INTRAMUSCULAR | Status: DC | PRN

## 2016-07-26 MED ORDER — SODIUM CHLORIDE 0.9 % IV SOLN
INTRAVENOUS | Status: DC
  Administered 2016-07-26: 125 mL/h via INTRAVENOUS
  Administered 2016-07-26: 14:00:00 via INTRAVENOUS
  Administered 2016-07-27: 125 mL/h via INTRAVENOUS
  Administered 2016-07-27: 17:00:00 via INTRAVENOUS
  Administered 2016-07-27 – 2016-07-28 (×3): 125 mL/h via INTRAVENOUS
  Administered 2016-07-29: 60 mL/h via INTRAVENOUS
  Administered 2016-07-29: 125 mL via INTRAVENOUS

## 2016-07-26 MED ORDER — NALOXONE HCL 0.4 MG/ML IJ SOLN: 0.4000 mg | INTRAMUSCULAR | Status: DC | PRN

## 2016-07-26 MED ORDER — HYDROMORPHONE HCL 1 MG/ML IJ SOLN
1.0000 mg | Freq: Once | INTRAMUSCULAR | Status: AC
  Administered 2016-07-26: 1 mg via INTRAVENOUS
  Filled 2016-07-26: qty 1

## 2016-07-26 MED ORDER — LABETALOL HCL 5 MG/ML IV SOLN
0.5000 mg/min | INTRAVENOUS | Status: DC
  Filled 2016-07-26: qty 100

## 2016-07-26 MED ORDER — ONDANSETRON HCL 4 MG/2ML IJ SOLN: 4.0000 mg | Freq: Four times a day (QID) | INTRAMUSCULAR | Status: DC | PRN

## 2016-07-26 MED ORDER — HYOSCYAMINE SULFATE 0.5 MG/ML IJ SOLN
0.1250 mg | Freq: Once | INTRAMUSCULAR | Status: AC
  Administered 2016-07-26: 0.125 mg via INTRAVENOUS
  Filled 2016-07-26: qty 0.25

## 2016-07-26 MED ORDER — METHOCARBAMOL 1000 MG/10ML IJ SOLN
1000.0000 mg | Freq: Three times a day (TID) | INTRAVENOUS | Status: DC | PRN
  Administered 2016-07-26: 1000 mg via INTRAVENOUS
  Filled 2016-07-26: qty 10

## 2016-07-26 MED ORDER — IOPAMIDOL (ISOVUE-300) INJECTION 61%
INTRAVENOUS | Status: AC
  Filled 2016-07-26: qty 100

## 2016-07-26 MED ORDER — FENTANYL 40 MCG/ML IV SOLN: INTRAVENOUS | Status: DC

## 2016-07-26 MED ORDER — INSULIN ASPART 100 UNIT/ML ~~LOC~~ SOLN
0.0000 [IU] | Freq: Every day | SUBCUTANEOUS | Status: DC
  Administered 2016-07-26 – 2016-07-28 (×2): 2 [IU] via SUBCUTANEOUS

## 2016-07-26 MED ORDER — INSULIN ASPART 100 UNIT/ML ~~LOC~~ SOLN
0.0000 [IU] | Freq: Three times a day (TID) | SUBCUTANEOUS | Status: DC
  Administered 2016-07-27: 2 [IU] via SUBCUTANEOUS
  Administered 2016-07-27: 1 [IU] via SUBCUTANEOUS
  Administered 2016-07-27 – 2016-07-28 (×2): 2 [IU] via SUBCUTANEOUS
  Administered 2016-07-29 (×2): 1 [IU] via SUBCUTANEOUS

## 2016-07-26 MED ORDER — ONDANSETRON HCL 4 MG/2ML IJ SOLN
4.0000 mg | Freq: Four times a day (QID) | INTRAMUSCULAR | Status: DC | PRN
  Administered 2016-07-26: 4 mg via INTRAVENOUS
  Filled 2016-07-26: qty 2

## 2016-07-26 MED ORDER — SODIUM CHLORIDE 0.9 % IV SOLN
Freq: Once | INTRAVENOUS | Status: AC
  Administered 2016-07-26: 10:00:00 via INTRAVENOUS

## 2016-07-26 MED ORDER — ENOXAPARIN SODIUM 40 MG/0.4ML ~~LOC~~ SOLN
40.0000 mg | SUBCUTANEOUS | Status: DC
  Administered 2016-07-26 – 2016-07-29 (×4): 40 mg via SUBCUTANEOUS
  Filled 2016-07-26 (×5): qty 0.4

## 2016-07-26 MED ORDER — ONDANSETRON HCL 4 MG PO TABS: 4.0000 mg | ORAL_TABLET | Freq: Four times a day (QID) | ORAL | Status: DC | PRN

## 2016-07-26 MED ORDER — FAMOTIDINE IN NACL 20-0.9 MG/50ML-% IV SOLN
20.0000 mg | Freq: Two times a day (BID) | INTRAVENOUS | Status: DC
  Administered 2016-07-26 – 2016-07-29 (×8): 20 mg via INTRAVENOUS
  Filled 2016-07-26 (×8): qty 50

## 2016-07-26 MED ORDER — LABETALOL HCL 5 MG/ML IV SOLN
10.0000 mg | Freq: Once | INTRAVENOUS | Status: AC
  Administered 2016-07-26: 10 mg via INTRAVENOUS
  Filled 2016-07-26: qty 4

## 2016-07-26 MED ORDER — DIPHENHYDRAMINE HCL 50 MG/ML IJ SOLN: 12.5000 mg | Freq: Four times a day (QID) | INTRAMUSCULAR | Status: DC | PRN

## 2016-07-26 MED ORDER — DILTIAZEM HCL 25 MG/5ML IV SOLN
20.0000 mg | Freq: Once | INTRAVENOUS | Status: AC
  Administered 2016-07-26: 20 mg via INTRAVENOUS
  Filled 2016-07-26: qty 5

## 2016-07-26 MED ORDER — ONDANSETRON HCL 4 MG/2ML IJ SOLN
4.0000 mg | Freq: Once | INTRAMUSCULAR | Status: AC | PRN
  Administered 2016-07-26: 4 mg via INTRAVENOUS
  Filled 2016-07-26: qty 2

## 2016-07-26 MED ORDER — DILTIAZEM HCL 25 MG/5ML IV SOLN
10.0000 mg | INTRAVENOUS | Status: DC | PRN
  Filled 2016-07-26: qty 5

## 2016-07-26 MED ORDER — KETOROLAC TROMETHAMINE 30 MG/ML IJ SOLN
30.0000 mg | Freq: Four times a day (QID) | INTRAMUSCULAR | Status: DC | PRN
  Administered 2016-07-26 – 2016-07-29 (×2): 30 mg via INTRAVENOUS
  Filled 2016-07-26 (×2): qty 1

## 2016-07-26 MED ORDER — HYDRALAZINE HCL 20 MG/ML IJ SOLN
5.0000 mg | INTRAMUSCULAR | Status: DC | PRN
  Administered 2016-07-26: 10 mg via INTRAVENOUS
  Filled 2016-07-26: qty 1

## 2016-07-26 MED ORDER — FENTANYL CITRATE (PF) 100 MCG/2ML IJ SOLN
50.0000 ug | INTRAMUSCULAR | Status: DC | PRN
  Administered 2016-07-26 (×2): 100 ug via INTRAVENOUS
  Filled 2016-07-26 (×2): qty 2

## 2016-07-26 NOTE — ED Notes (Signed)
Hooked patient up to the monitor after bathroom patient is back in bed resting

## 2016-07-26 NOTE — ED Triage Notes (Signed)
Pt reports abd pain with distention for 2 days now. Pt also reports back spasms and N/V/D.

## 2016-07-26 NOTE — ED Notes (Signed)
Patient transported to Ultrasound 

## 2016-07-26 NOTE — ED Notes (Signed)
Dr. Merrell at bedside. 

## 2016-07-26 NOTE — ED Notes (Signed)
Placed pt on 2L [place]. Pt's SPO2 has been around 95% while she has been in the ED.  Since given Mx pt SPO2 has been around 92%.

## 2016-07-26 NOTE — H&P (Addendum)
    Made aware of continued BP elevation by nursing staff. Pts BP remains markedly elevated despite 10 mg of hydralazine and of Lopressor and IV pain medications. Her HTN urgency is not likely secondary to pain alone as there is little to no change after narcotic administration. Pt did not take her lisinopril prior to coming to ED on day of admission. At this point time will put patient on a labetalol drip and fentanyl PCA. Patient will be transferred to stepdown unit for closer monitoring until she is able to wean off of these.  Shelly Flattenavid Ashby Leflore, MD [facility] 07/26/2016, 1:52 PM

## 2016-07-26 NOTE — ED Provider Notes (Signed)
[facility]-EMERGENCY DEPT Provider Note   CSN: [id number] Arrival date & time: 07/26/16  0451     History   Chief Complaint Chief Complaint  Patient presents with  . Nausea  . Emesis  . Abdominal Pain    HPI Mallory Stewart is a 56 y.o. female.  Patient presents with progressively worsening abdominal pain and nausea x 2 days, with onset of vomiting tonight. No fever. She reports abdominal distention and bloating with diarrhea. No hematemesis or bloody stools. No cough, SOB, chest pain. She denies urinary symptoms. She states she had pancreatitis on one occasion years ago with "possibly" similar symptoms.   The history is provided by the patient. No language interpreter was used.  Emesis   Associated symptoms include abdominal pain. Pertinent negatives include no chills, no fever and no myalgias.  Abdominal Pain   Associated symptoms include nausea and vomiting. Pertinent negatives include fever and myalgias.    Past Medical History:  Diagnosis Date  . Diabetes (HCC)   . Hypertension     Patient Active Problem List   Diagnosis Date Noted  . Hyperkalemia 03/02/2016  . Post concussive syndrome 11/23/2015  . B12 deficiency 10/21/2014  . Neuropathic pain of both legs 02/11/2014  . Pancreatic pseudocyst/cyst 05/06/2013  . Fatty liver 04/27/2013  . Elevated liver enzymes 02/05/2011  . EXCESSIVE OR FREQUENT MENSTRUATION 05/10/2009  . Diabetes mellitus without complication (HCC) 11/03/2008  . HYPERCHOLESTEROLEMIA 11/03/2008  . Essential hypertension, benign 09/29/2008  . PAP SMEAR, ABNORMAL 09/29/2008    Past Surgical History:  Procedure Laterality Date  . LEEP  1990's  . ORIF FOOT FRACTURE  09/2008   L 5th metatarsal  . REFRACTIVE SURGERY  2000  . TONSILLECTOMY      OB History    No data available       Home Medications    Prior to Admission medications   Medication Sig Start Date End Date Taking? Authorizing Provider  gabapentin (NEURONTIN) 300 MG capsule  Take 1 capsule (300 mg total) by mouth at bedtime. 11/07/15  Yes Amy E Bedsole, MD  lisinopril (PRINIVIL,ZESTRIL) 40 MG tablet TAKE 1 TABLET BY MOUTH  DAILY 07/04/16  Yes Amy E Bedsole, MD  metFORMIN (GLUCOPHAGE-XR) 500 MG 24 hr tablet TAKE 2 TABLETS BY MOUTH IN  THE MORNING AND 1 TABLET AT BEDTIME 07/04/16  Yes Amy E Bedsole, MD  oxyCODONE-acetaminophen (PERCOCET/ROXICET) 5-325 MG tablet Take 1 tablet by mouth 3 (three) times daily. 06/19/16  Yes Amy Michelle NasutiE Bedsole, MD  Lancets (ONETOUCH ULTRASOFT) lancets Use as instructed to check blood sugars twice a day     Historical Provider, MD  ONE TOUCH ULTRA TEST test strip Check blood sugar two times daily as directed 12/20/15   Excell SeltzerAmy E Bedsole, MD    Family History Family History  Problem Relation Age of Onset  . Other Mother     tachycardia.Marland Kitchen.Marland Kitchen.?afib  . Stroke Father     after hernia suegery  . Prostate cancer Father   . Other Father     global transient amnesia, unclear source  . Atrial fibrillation Sister   . Healthy Brother   . Healthy Brother   . Coronary artery disease Paternal Grandmother   . Heart attack Paternal Grandmother [id number]  . Brain cancer Maternal Grandfather     ?  Marland Kitchen. Cancer Paternal Grandfather     ?  Marland Kitchen. Breast cancer Maternal Grandmother     Social History Social History  Substance Use Topics  . Smoking status: Never Smoker  .  Smokeless tobacco: Never Used  . Alcohol use 4.2 oz/week    7 Glasses of wine per week     Comment: wine     Allergies   Simvastatin   Review of Systems Review of Systems  Constitutional: Negative for chills and fever.  HENT: Negative.   Respiratory: Negative.   Cardiovascular: Negative.   Gastrointestinal: Positive for abdominal pain, nausea and vomiting.  Musculoskeletal: Positive for back pain (She reports onset of back spasms with forceful wretching.). Negative for myalgias.  Skin: Negative.   Neurological: Negative.      Physical Exam Updated Vital Signs BP (!) 196/106 (BP Location:  Right Arm)   Pulse 81   Temp 97.7 F (36.5 C) (Oral)   Resp (!) 22   Ht 5' 9.5" (1.765 m)   Wt 83.9 kg   SpO2 98%   BMI 26.93 kg/m   Physical Exam  Constitutional: She is oriented to person, place, and time. She appears well-developed and well-nourished.  HENT:  Head: Normocephalic.  Neck: Normal range of motion. Neck supple.  Cardiovascular: Normal rate and regular rhythm.   Pulmonary/Chest: Effort normal and breath sounds normal. She has no wheezes. She has no rales.  Abdominal: Soft. Bowel sounds are normal. She exhibits distension. There is tenderness (Diffuse abdominal tenderness. ). There is guarding. There is no rebound.  Musculoskeletal: Normal range of motion.  Neurological: She is alert and oriented to person, place, and time.  Skin: Skin is warm and dry.  Psychiatric: She has a normal mood and affect.     ED Treatments / Results  Labs (all labs ordered are listed, but only abnormal results are displayed) Labs Reviewed  CBC - Abnormal; Notable for the following:       Result Value   Platelets 137 (*)    All other components within normal limits  LIPASE, BLOOD  COMPREHENSIVE METABOLIC PANEL  URINALYSIS, ROUTINE W REFLEX MICROSCOPIC  I-STAT TROPOININ, ED   Results for orders placed or performed during the hospital encounter of 07/26/16  CBC  Result Value Ref Range   WBC 5.4 4.0 - 10.5 K/uL   RBC 4.02 3.87 - 5.11 MIL/uL   Hemoglobin 13.1 12.0 - 15.0 g/dL   HCT 16.1 09.6 - 04.5 %   MCV 97.0 78.0 - 100.0 fL   MCH 32.6 26.0 - 34.0 pg   MCHC 33.6 30.0 - 36.0 g/dL   RDW 40.9 81.1 - 91.4 %   Platelets 137 (L) 150 - 400 K/uL  I-stat troponin, ED  Result Value Ref Range   Troponin i, poc 0.00 0.00 - 0.08 ng/mL   Comment 3            EKG  EKG Interpretation  Date/Time:  Thursday July 26 2016 05:55:56 EDT Ventricular Rate:  81 PR Interval:  142 QRS Duration: 72 QT Interval:  400 QTC Calculation: 464 R Axis:   45 Text Interpretation:  Normal sinus  rhythm Nonspecific T wave abnormality Prolonged QT Abnormal ECG Confirmed by Wilkie Aye  MD, Toni Amend ([id number]) on 07/26/2016 6:38:32 AM       Radiology No results found.  Procedures Procedures (including critical care time)  Medications Ordered in ED Medications  ondansetron (ZOFRAN) injection 4 mg (4 mg Intravenous Given 07/26/16 0602)  sodium chloride 0.9 % bolus 500 mL (500 mLs Intravenous New Bag/Given 07/26/16 0606)  hyoscyamine (LEVSIN) 0.5 MG/ML injection 0.125 mg (0.125 mg Intravenous Given 07/26/16 6213)     Initial Impression / Assessment and Plan / ED  Course  I have reviewed the triage vital signs and the nursing notes.  Pertinent labs & imaging results that were available during my care of the patient were reviewed by me and considered in my medical decision making (see chart for details).     Patient presents with abdominal pain progressively worsening over 2 days. She is tender to exam here with pain that is difficult to manage. She is seen by Dr. Wilkie Aye and a cT scan is felt appropriate.  Patient care signed out to Audry Pili, PA-C with CT pending.  Final Clinical Impressions(s) / ED Diagnoses   Final diagnoses:  None   1. Abdominal pain 2. Nausea and vomiting  New Prescriptions New Prescriptions   No medications on file     Elpidio Anis, Cordelia Poche 07/26/16 1610    Shon Baton, MD 08/04/16 [phone]

## 2016-07-26 NOTE — ED Provider Notes (Signed)
  Physical Exam  BP (!) 196/106 (BP Location: Right Arm)   Pulse 81   Temp 97.7 F (36.5 C) (Oral)   Resp (!) 22   Ht 5' 9.5" (1.765 m)   Wt 83.9 kg   SpO2 98%   BMI 26.93 kg/m   Physical Exam  ED Course  Procedures  MDM 7:09 AM- Elpidio AnisShari Upstill, PA-C  Per previous provider HPI: Patient presents with progressively worsening abdominal pain and nausea x 2 days, with onset of vomiting tonight. No fever. She reports abdominal distention and bloating with diarrhea. No hematemesis or bloody stools. No cough, SOB, chest pain. She denies urinary symptoms. She states she had pancreatitis on one occasion years ago with "possibly" similar symptoms.  Pending CMP, Lipase, CT Abdomen/Pelvis  7:48 AM- CMP unremarkable. Liver enzymes unremarkable. Lipase elevated 852. Previous 113 x 3 years ago.   9:35 AM- CT without evidence of Pseudocyst or Necrosis. Will admit to medicine for acute pancreatitis. Likely Idiopathic. Pt does not consume ETOH in excess. Given fluids, anagelsia. NPO.          Audry Piliyler Jaicion Laurie, PA-C 07/26/16 [id number]    Alvira MondayErin Schlossman, MD 07/27/16 [id number]

## 2016-07-26 NOTE — ED Notes (Signed)
Tried to call report. Waiting on charge nurse to assign pt and call down and get report.

## 2016-07-26 NOTE — ED Notes (Signed)
Attempted to call report

## 2016-07-26 NOTE — H&P (Signed)
History and Physical    Mallory Stewart ZOX:[id number] DOB: 09-Dec-1960 DOA: 07/26/2016  PCP: Kerby Nora, MD Patient coming from: home  Chief Complaint: NV Adb pain  HPI: Mallory Stewart is a 56 y.o. female with medical history significant of diabetes, hypertension, pancreatitis   History of abdominal pain and nausea. Associated with intermittent vomiting for the past 1 day. Patient states she feels that her abdomen is distended and bloated. Loose stools. Denies any hematemesis, hematochezia, melena, chest pain, shortness of breath, palpitations, cough, dysuria, frequency, flank pain, fevers. States that her symptoms are similar to her previous bout of pancreatitis. Has not tried anything for sx.  Patient endorses intermittent alcohol use with increase from baseline on the night prior to onset of symptoms. Patient did attempt to eat a little something one day ago which seemed to worsen pain. Patient also complaining of lower back pain.  ED Course: Objective findings outlined below. Reglan, labetalol, IV fluids administered.  Review of Systems: As per HPI otherwise 10 point review of systems negative.   Ambulatory Status: No restrictions  Past Medical History:  Diagnosis Date  . Diabetes (HCC)   . Hypertension   . Pancreatitis     Past Surgical History:  Procedure Laterality Date  . LEEP  1990's  . ORIF FOOT FRACTURE  09/2008   L 5th metatarsal  . REFRACTIVE SURGERY  2000  . TONSILLECTOMY      Social History   Social History  . Marital status: Married    Spouse name: N/A  . Number of children: N/A  . Years of education: N/A   Occupational History  . Director of business development    Social History Main Topics  . Smoking status: Never Smoker  . Smokeless tobacco: Never Used  . Alcohol use 4.2 oz/week    7 Glasses of wine per week     Comment: wine  . Drug use: No  . Sexual activity: Not on file   Other Topics Concern  . Not on file   Social History  Narrative   Regular exercise , walking 4-5 times a week   Diet fruits and veggies, water    Allergies  Allergen Reactions  . Simvastatin     Elevated lfts?    Family History  Problem Relation Age of Onset  . Other Mother     tachycardia.Marland KitchenMarland Kitchen?afib  . Stroke Father     after hernia suegery  . Prostate cancer Father   . Other Father     global transient amnesia, unclear source  . Atrial fibrillation Sister   . Healthy Brother   . Healthy Brother   . Coronary artery disease Paternal Grandmother   . Heart attack Paternal Grandmother 29  . Brain cancer Maternal Grandfather     ?  Marland Kitchen Cancer Paternal Grandfather     ?  Marland Kitchen Breast cancer Maternal Grandmother     Prior to Admission medications   Medication Sig Start Date End Date Taking? Authorizing Provider  gabapentin (NEURONTIN) 300 MG capsule Take 1 capsule (300 mg total) by mouth at bedtime. 11/07/15  Yes Amy E Bedsole, MD  lisinopril (PRINIVIL,ZESTRIL) 40 MG tablet TAKE 1 TABLET BY MOUTH  DAILY 07/04/16  Yes Amy E Bedsole, MD  metFORMIN (GLUCOPHAGE-XR) 500 MG 24 hr tablet TAKE 2 TABLETS BY MOUTH IN  THE MORNING AND 1 TABLET AT BEDTIME 07/04/16  Yes Amy E Bedsole, MD  oxyCODONE-acetaminophen (PERCOCET/ROXICET) 5-325 MG tablet Take 1 tablet by mouth 3 (three) times daily.  06/19/16  Yes Amy Michelle Nasuti, MD  Lancets (ONETOUCH ULTRASOFT) lancets Use as instructed to check blood sugars twice a day     Historical Provider, MD  ONE TOUCH ULTRA TEST test strip Check blood sugar two times daily as directed 12/20/15   Excell Seltzer, MD    Physical Exam: Vitals:   07/26/16 1015 07/26/16 1030 07/26/16 1045 07/26/16 1115  BP: (!) 198/116 (!) 182/123 (!) 194/122 (!) 184/122  Pulse: 76 87 79 82  Resp: 14 16 18 19   Temp:      TempSrc:      SpO2: 92% 92% 94% 91%  Weight:      Height:         General:  Appears calm and comfortable Eyes:  PERRL, EOMI, normal lids, iris ENT:  grossly normal hearing, lips & tongue, mmm Neck:  no LAD, masses or  thyromegaly Cardiovascular:  RRR, no m/r/g. No LE edema.  Respiratory:  CTA bilaterally, no w/r/r. Normal respiratory effort. Abdomen:  soft, ntnd, NABS Skin:  no rash or induration seen on limited exam Musculoskeletal:  grossly normal tone BUE/BLE, good ROM, no bony abnormality Psychiatric:  grossly normal mood and affect, speech fluent and appropriate, AOx3 Neurologic:  CN 2-12 grossly intact, moves all extremities in coordinated fashion, sensation intact  Labs on Admission: I have personally reviewed following labs and imaging studies  CBC:  Recent Labs Lab 07/26/16 0543  WBC 5.4  HGB 13.1  HCT 39.0  MCV 97.0  PLT 137*   Basic Metabolic Panel:  Recent Labs Lab 07/26/16 0543  NA 142  K 4.6  CL 99*  CO2 26  GLUCOSE 231*  BUN <5*  CREATININE 0.76  CALCIUM 9.4   GFR: Estimated Creatinine Clearance: 95 mL/min (by C-G formula based on SCr of 0.76 mg/dL). Liver Function Tests:  Recent Labs Lab 07/26/16 0543  AST 60*  ALT 40  ALKPHOS 53  BILITOT 0.9  PROT 7.1  ALBUMIN 4.1    Recent Labs Lab 07/26/16 0543  LIPASE 852*   No results for input(s): AMMONIA in the last 168 hours. Coagulation Profile: No results for input(s): INR, PROTIME in the last 168 hours. Cardiac Enzymes: No results for input(s): CKTOTAL, CKMB, CKMBINDEX, TROPONINI in the last 168 hours. BNP (last 3 results) No results for input(s): PROBNP in the last 8760 hours. HbA1C: No results for input(s): HGBA1C in the last 72 hours. CBG: No results for input(s): GLUCAP in the last 168 hours. Lipid Profile: No results for input(s): CHOL, HDL, LDLCALC, TRIG, CHOLHDL, LDLDIRECT in the last 72 hours. Thyroid Function Tests: No results for input(s): TSH, T4TOTAL, FREET4, T3FREE, THYROIDAB in the last 72 hours. Anemia Panel: No results for input(s): VITAMINB12, FOLATE, FERRITIN, TIBC, IRON, RETICCTPCT in the last 72 hours. Urine analysis:    Component Value Date/Time   COLORURINE YELLOW  07/26/2016 0933   APPEARANCEUR CLEAR 07/26/2016 0933   APPEARANCEUR Hazy 04/07/2013 2105   LABSPEC >1.046 (H) 07/26/2016 0933   LABSPEC 1.020 04/07/2013 2105   PHURINE 6.0 07/26/2016 0933   GLUCOSEU 50 (A) 07/26/2016 0933   GLUCOSEU 150 mg/dL 16/02/9603 5409   HGBUR NEGATIVE 07/26/2016 0933   HGBUR negative 11/03/2008 0822   BILIRUBINUR NEGATIVE 07/26/2016 0933   BILIRUBINUR Negative 04/07/2013 2105   KETONESUR 5 (A) 07/26/2016 0933   PROTEINUR NEGATIVE 07/26/2016 0933   UROBILINOGEN 0.2 11/03/2008 0822   NITRITE NEGATIVE 07/26/2016 0933   LEUKOCYTESUR NEGATIVE 07/26/2016 0933   LEUKOCYTESUR Negative 04/07/2013 2105  Creatinine Clearance: Estimated Creatinine Clearance: 95 mL/min (by C-G formula based on SCr of 0.76 mg/dL).  Sepsis Labs: @LABRCNTIP (procalcitonin:4,lacticidven:4) )No results found for this or any previous visit (from the past 240 hour(s)).   Radiological Exams on Admission: Ct Abdomen Pelvis W Contrast  Result Date: 07/26/2016 CLINICAL DATA:  Midline abdominal pain, epigastric pain nausea and vomiting for 2 days EXAM: CT ABDOMEN AND PELVIS WITH CONTRAST TECHNIQUE: Multidetector CT imaging of the abdomen and pelvis was performed using the standard protocol following bolus administration of intravenous contrast. CONTRAST:  100 cc Isovue COMPARISON:  05/01/2013 FINDINGS: Lower chest: Atherosclerotic calcifications of coronary arteries are noted. Heart size within normal limits. Images of the lung bases shows mild dependent atelectasis bilateral lower lobe posteriorly. Hepatobiliary: There is mild fatty infiltration of the liver. No calcified gallstones are noted within gallbladder. Pancreas: There is diffuse mild swelling of the pancreas. Small peripancreatic fluid and stranding noted. Findings are highly suspicious for recurrent acute pancreatitis. No definite evidence of pancreatic necrosis or pancreatic pseudocyst. Spleen: Enhanced spleen without focal abnormality.  Adrenals/Urinary Tract: No adrenal gland mass. Enhanced kidneys are symmetrical in size. No hydronephrosis or hydroureter. Delayed renal images shows bilateral renal symmetrical excretion. The urinary bladder is unremarkable. Stomach/Bowel: There is no small bowel obstruction. There is small amount of fluid surrounding the duodenum and between pancreatic head and duodenum. This most likely due to secondary inflammatory changes from acute pancreatitis. No small bowel obstruction. No thickened or dilated small bowel loops. No pericecal inflammation. Normal appendix partially visualized in axial image 81. Some colonic stool noted in descending colon and proximal sigmoid colon. No evidence of distal colitis or diverticulitis. No colonic obstruction. Vascular/Lymphatic: No aortic aneurysm. Mild atherosclerotic calcifications of abdominal aorta and iliac arteries. Axial image 34 there is a portal caval lymph node measures 1.5 cm probable reactive. Reproductive: The uterus is anteflexed normal size.  No pelvic mass. Other: No evidence of free abdominal air. Tiny fluid/ stranding in right retroperitoneum and right paracolic gutter probable secondary to acute pancreatitis. Musculoskeletal: No destructive bony lesions are noted. Sagittal images of the spine shows mild degenerative changes lumbar spine. IMPRESSION: 1. There is diffuse mild swelling of the pancreas. Small peripancreatic fluid and stranding noted. Findings are highly suspicious for recurrent acute pancreatitis. No definite evidence of pancreatic necrosis or pancreatic pseudocyst. 2. Mild inflammatory changes/fluid surrounding the duodenum and between duodenum and pancreatic head most likely secondary to pancreatic inflammation rather than duodenitis. Clinical correlation is necessary. 3. No hydronephrosis or hydroureter. 4. Normal appendix.  No pericecal inflammation. 5. Mild degenerative changes thoracic spine. 6. No small bowel obstruction. Electronically  Signed   By: Natasha Mead M.D.   On: 07/26/2016 09:27    EKG: Independently reviewed. Sinus, no ACS  Assessment/Plan Active Problems:   Diabetes mellitus without complication (HCC)   Essential hypertension, benign   Neuropathic pain of both legs   Pancreatitis   Back pain   Pancreatitis. LIkely source of pts abd pan and n/v. Lipase 852, AST 60, ALT 40. CT abdomen with diffuse mild swelling of the pancreas without evidence of pseudocyst or necrosis. Likely due to EtOH use. Patient endorses social drinking habits with intermittent increased use during special occasions. Patient had one shot of tequila and 3 glasses of wine the night prior to onset of symptoms. Patient has had one other episode of pancreatitis. No evidence of gallstones on imaging or, bile duct dilatation. Triglyceride levels in the past essentially normal.  - IVF, IV pain medications (  No PCA at this time - required one during last admission) - NPO, resume clears on 07/27/16 if able - RUQ US to fully assess gallbladder and CBG - Repeat Lipid panel  Back pain: likely from muscle spasm w/ referred pancreatic pain. Pt w/ longstanding h/o msk pain. - Treatment as above - Robaxin - Heating pad  HTN: - hold lisinopril until taking po - Hydralazine PRN  DM: - SSI - Hold metformin  Chronic pain:  - continue neurontin  DVT prophylaxis: lovenox  Code Status: full  Family Communication: none  Disposition Plan: pending improvement in pancreatitis  Consults called: none  Admission status: inpatient    MERRELL, DAVID J MD [facility]  If 7PM-7AM, please contact night-coverage [email or web address] Password [email or web address]  07/26/2016, 11:28 AM

## 2016-07-26 NOTE — ED Notes (Signed)
Checked patient cbg it was [id number] notified RN of blood sugar patient is resting

## 2016-07-26 NOTE — ED Notes (Signed)
Pt vomiting.

## 2016-07-27 DIAGNOSIS — M549 Dorsalgia, unspecified: Secondary | ICD-10-CM

## 2016-07-27 LAB — COMPREHENSIVE METABOLIC PANEL
ALK PHOS: 46 U/L (ref 38–126)
ALT: 27 U/L (ref 14–54)
AST: 29 U/L (ref 15–41)
Albumin: 3.6 g/dL (ref 3.5–5.0)
Anion gap: 13 (ref 5–15)
BILIRUBIN TOTAL: 0.8 mg/dL (ref 0.3–1.2)
BUN: 8 mg/dL (ref 6–20)
CALCIUM: 8.5 mg/dL — AB (ref 8.9–10.3)
CO2: 27 mmol/L (ref 22–32)
Chloride: 102 mmol/L (ref 101–111)
Creatinine, Ser: 0.93 mg/dL (ref 0.44–1.00)
GFR calc Af Amer: >60 mL/min (ref >60)
Glucose, Bld: 164 mg/dL — ABNORMAL HIGH (ref 65–99)
POTASSIUM: 4.6 mmol/L (ref 3.5–5.1)
Sodium: 142 mmol/L (ref 135–145)
TOTAL PROTEIN: 6.3 g/dL — AB (ref 6.5–8.1)

## 2016-07-27 LAB — CBC
HEMATOCRIT: 37.2 % (ref 36.0–46.0)
Hemoglobin: 12.4 g/dL (ref 12.0–15.0)
MCH: 32.5 pg (ref 26.0–34.0)
MCHC: 33.3 g/dL (ref 30.0–36.0)
MCV: 97.4 fL (ref 78.0–100.0)
Platelets: 125 10*3/uL — ABNORMAL LOW (ref 150–400)
RBC: 3.82 MIL/uL — ABNORMAL LOW (ref 3.87–5.11)
RDW: 13.5 % (ref 11.5–15.5)
WBC: 6.3 10*3/uL (ref 4.0–10.5)

## 2016-07-27 LAB — LIPID PANEL
CHOL/HDL RATIO: 2.9 ratio
Cholesterol: 248 mg/dL — ABNORMAL HIGH (ref 0–200)
HDL: 86 mg/dL (ref >40)
LDL CALC: 130 mg/dL — AB (ref 0–99)
TRIGLYCERIDES: 161 mg/dL — AB (ref <150)
VLDL: 32 mg/dL (ref 0–40)

## 2016-07-27 LAB — GLUCOSE, CAPILLARY
GLUCOSE-CAPILLARY: 137 mg/dL — AB (ref 65–99)
Glucose-Capillary: 161 mg/dL — ABNORMAL HIGH (ref 65–99)
Glucose-Capillary: 176 mg/dL — ABNORMAL HIGH (ref 65–99)
Glucose-Capillary: 146 mg/dL — ABNORMAL HIGH (ref 65–99)

## 2016-07-27 LAB — LIPASE, BLOOD: Lipase: 510 U/L — ABNORMAL HIGH (ref 11–51)

## 2016-07-27 MED ORDER — LISINOPRIL 40 MG PO TABS
40.0000 mg | ORAL_TABLET | Freq: Every day | ORAL | Status: DC
  Administered 2016-07-27 – 2016-07-30 (×4): 40 mg via ORAL
  Filled 2016-07-27 (×4): qty 1

## 2016-07-27 MED ORDER — METOPROLOL TARTRATE 50 MG PO TABS
50.0000 mg | ORAL_TABLET | Freq: Two times a day (BID) | ORAL | Status: DC
Start: 2016-07-27 — End: 2016-07-30
  Administered 2016-07-27 – 2016-07-30 (×7): 50 mg via ORAL
  Filled 2016-07-27 (×7): qty 1

## 2016-07-27 MED ORDER — METHOCARBAMOL 1000 MG/10ML IJ SOLN
500.0000 mg | Freq: Four times a day (QID) | INTRAVENOUS | Status: DC | PRN
  Administered 2016-07-29: 500 mg via INTRAVENOUS
  Filled 2016-07-27 (×3): qty 5

## 2016-07-27 NOTE — Consult Note (Signed)
[facility] Surgery Consult/Admission Note  Mallory Stewart 1960/10/07  [id number].    Requesting MD: Dr. [place] Pink Chief Complaint/Reason for Consult: gallstone pancreatitis    HPI:  Pt is a 56 year old female with a history of pancreatitis, DM, HTN who presented to the [facility] ED yesterday with abdominal pain, nausea and vomiting. Pt states she started to feel bad on Sunday with mild abdominal pain. Yesterday the pain became severe, constant, in the RUQ and epigastric region and radiates inferiorly, worse with movement and touch. Associated nausea, vomiting and chills. Pt had diarrhea earlier in the week but it resolved. Pt denies fever, blood in her vomit, blood in her stool, CP, SOB, dysuria, or hematuria.  ED Course: hypertension, afebrile LABS: lipase 852, AST 60, Anion gap 17, glucose 231 CT: There is diffuse mild swelling of the pancreas. Small peripancreatic fluid and stranding noted. No definite evidence of pancreatic necrosis or pancreatic pseudocyst. Mild inflammatory changes/fluid surrounding the duodenum and between duodenum and pancreatic head most likely secondary to pancreatic inflammation rather than duodenitis. [place]: Cholelithiasis and gallbladder sludge. No evidence of acute cholecystitis. Mildly dilated common bile duct (7 mm diameter). No sonographic evidence of choledocholithiasis, with limitations as described. Moderate diffuse hepatic steatosis.   ROS:  Review of Systems  Constitutional: Positive for chills. Negative for diaphoresis and fever.  HENT: Negative for congestion and sore throat.   Eyes: Negative for pain and discharge.  Respiratory: Negative for cough and shortness of breath.   Cardiovascular: Negative for chest pain.  Gastrointestinal: Positive for abdominal pain, diarrhea, nausea and vomiting. Negative for blood in stool.  Genitourinary: Negative for dysuria and hematuria.  Musculoskeletal: Negative for falls.  Skin: Negative for itching and rash.   Neurological: Negative for dizziness, loss of consciousness and headaches.  All other systems reviewed and are negative.    Family History  Problem Relation Age of Onset  . Other Mother     tachycardia.Marland KitchenMarland Kitchen?afib  . Stroke Father     after hernia suegery  . Prostate cancer Father   . Other Father     global transient amnesia, unclear source  . Atrial fibrillation Sister   . Healthy Brother   . Healthy Brother   . Coronary artery disease Paternal Grandmother   . Heart attack Paternal Grandmother 59  . Brain cancer Maternal Grandfather     ?  Marland Kitchen Cancer Paternal Grandfather     ?  Marland Kitchen Breast cancer Maternal Grandmother     Past Medical History:  Diagnosis Date  . Diabetes ([place])   . Hypertension   . Pancreatitis     Past Surgical History:  Procedure Laterality Date  . LEEP  1990's  . ORIF FOOT FRACTURE  09/2008   L 5th metatarsal  . REFRACTIVE SURGERY  2000  . TONSILLECTOMY      Social History:  reports that she has never smoked. She has never used smokeless tobacco. She reports that she drinks about 4.2 oz of alcohol per week . She reports that she does not use drugs.  Allergies:  Allergies  Allergen Reactions  . Simvastatin     Elevated lfts?    Medications Prior to Admission  Medication Sig Dispense Refill  . gabapentin (NEURONTIN) 300 MG capsule Take 1 capsule (300 mg total) by mouth at bedtime. 90 capsule 3  . lisinopril (PRINIVIL,ZESTRIL) 40 MG tablet TAKE 1 TABLET BY MOUTH  DAILY 90 tablet 1  . metFORMIN (GLUCOPHAGE-XR) 500 MG 24 hr tablet TAKE 2 TABLETS BY  MOUTH IN  THE MORNING AND 1 TABLET AT BEDTIME 270 tablet 1  . oxyCODONE-acetaminophen (PERCOCET/ROXICET) 5-325 MG tablet Take 1 tablet by mouth 3 (three) times daily. 90 tablet 0  . Lancets (ONETOUCH ULTRASOFT) lancets Use as instructed to check blood sugars twice a day     . ONE TOUCH ULTRA TEST test strip Check blood sugar two times daily as directed 100 each 2    Blood pressure (!) 180/104, pulse 92,  temperature 99 F (37.2 C), temperature source Oral, resp. rate 18, height 5' 9.5" (1.765 m), weight 185 lb 6.5 oz (84.1 kg), SpO2 93 %.  Physical Exam  Constitutional: She is oriented to person, place, and time and well-developed, well-nourished, and in no distress. Vital signs are normal. No distress.  Well appearing, pleasant white female  HENT:  Head: Normocephalic and atraumatic.  Nose: Nose normal.  Eyes: Conjunctivae are normal. Right eye exhibits no discharge. Left eye exhibits no discharge. No scleral icterus.  Neck: Normal range of motion. Neck supple.  Cardiovascular: Normal rate, regular rhythm, normal heart sounds and intact distal pulses.  Exam reveals no gallop and no friction rub.   No murmur heard. Pulses:      Radial pulses are 2+ on the right side, and 2+ on the left side.  Pulmonary/Chest: Effort normal and breath sounds normal. No respiratory distress. She has no decreased breath sounds. She has no wheezes. She has no rhonchi. She has no rales.  Abdominal: Soft. Normal appearance and bowel sounds are normal. She exhibits no distension and no mass. There is tenderness. There is no rigidity, no rebound and no guarding.  Obese abdomen, generalized TTP worse in the epigastric region and RUQ  Musculoskeletal: Normal range of motion. She exhibits no tenderness or deformity.  Neurological: She is alert and oriented to person, place, and time.  Skin: Skin is warm and dry. No rash noted. She is not diaphoretic.  Psychiatric: Mood and affect normal.  Nursing note and vitals reviewed.   Results for orders placed or performed during the hospital encounter of 07/26/16 (from the past 48 hour(s))  Lipase, blood     Status: Abnormal   Collection Time: 07/26/16  5:43 AM  Result Value Ref Range   Lipase 852 (H) 11 - 51 U/L    Comment: RESULTS CONFIRMED BY MANUAL DILUTION  Comprehensive metabolic panel     Status: Abnormal   Collection Time: 07/26/16  5:43 AM  Result Value Ref  Range   Sodium 142 135 - 145 mmol/L   Potassium 4.6 3.5 - 5.1 mmol/L   Chloride 99 (L) 101 - 111 mmol/L   CO2 26 22 - 32 mmol/L   Glucose, Bld 231 (H) 65 - 99 mg/dL   BUN <5 (L) 6 - 20 mg/dL   Creatinine, Ser 0.76 0.44 - 1.00 mg/dL   Calcium 9.4 8.9 - 10.3 mg/dL   Total Protein 7.1 6.5 - 8.1 g/dL   Albumin 4.1 3.5 - 5.0 g/dL   AST 60 (H) 15 - 41 U/L   ALT 40 14 - 54 U/L   Alkaline Phosphatase 53 38 - 126 U/L   Total Bilirubin 0.9 0.3 - 1.2 mg/dL   GFR calc non Af Amer >60 >60 mL/min   GFR calc Af Amer >60 >60 mL/min    Comment: (NOTE) The eGFR has been calculated using the CKD EPI equation. This calculation has not been validated in all clinical situations. eGFR's persistently <60 mL/min signify possible Chronic Kidney Disease.  Anion gap 17 (H) 5 - 15  CBC     Status: Abnormal   Collection Time: 07/26/16  5:43 AM  Result Value Ref Range   WBC 5.4 4.0 - 10.5 K/uL   RBC 4.02 3.87 - 5.11 MIL/uL   Hemoglobin 13.1 12.0 - 15.0 g/dL   HCT 39.0 36.0 - 46.0 %   MCV 97.0 78.0 - 100.0 fL   MCH 32.6 26.0 - 34.0 pg   MCHC 33.6 30.0 - 36.0 g/dL   RDW 13.2 11.5 - 15.5 %   Platelets 137 (L) 150 - 400 K/uL  I-stat troponin, ED     Status: None   Collection Time: 07/26/16  6:08 AM  Result Value Ref Range   Troponin i, poc 0.00 0.00 - 0.08 ng/mL   Comment 3            Comment: Due to the release kinetics of cTnI, a negative result within the first hours of the onset of symptoms does not rule out myocardial infarction with certainty. If myocardial infarction is still suspected, repeat the test at appropriate intervals.   Urinalysis, Routine w reflex microscopic     Status: Abnormal   Collection Time: 07/26/16  9:33 AM  Result Value Ref Range   Color, Urine YELLOW YELLOW   APPearance CLEAR CLEAR   Specific Gravity, Urine >1.046 (H) 1.005 - 1.030   pH 6.0 5.0 - 8.0   Glucose, UA 50 (A) NEGATIVE mg/dL   Hgb urine dipstick NEGATIVE NEGATIVE   Bilirubin Urine NEGATIVE NEGATIVE    Ketones, ur 5 (A) NEGATIVE mg/dL   Protein, ur NEGATIVE NEGATIVE mg/dL   Nitrite NEGATIVE NEGATIVE   Leukocytes, UA NEGATIVE NEGATIVE  CBG monitoring, ED     Status: Abnormal   Collection Time: 07/26/16  1:46 PM  Result Value Ref Range   Glucose-Capillary 207 (H) 65 - 99 mg/dL  Glucose, capillary     Status: Abnormal   Collection Time: 07/26/16  9:07 PM  Result Value Ref Range   Glucose-Capillary 231 (H) 65 - 99 mg/dL  CBC     Status: Abnormal   Collection Time: 07/27/16  3:03 AM  Result Value Ref Range   WBC 6.3 4.0 - 10.5 K/uL   RBC 3.82 (L) 3.87 - 5.11 MIL/uL   Hemoglobin 12.4 12.0 - 15.0 g/dL   HCT 37.2 36.0 - 46.0 %   MCV 97.4 78.0 - 100.0 fL   MCH 32.5 26.0 - 34.0 pg   MCHC 33.3 30.0 - 36.0 g/dL   RDW 13.5 11.5 - 15.5 %   Platelets 125 (L) 150 - 400 K/uL  Comprehensive metabolic panel     Status: Abnormal   Collection Time: 07/27/16  3:03 AM  Result Value Ref Range   Sodium 142 135 - 145 mmol/L   Potassium 4.6 3.5 - 5.1 mmol/L   Chloride 102 101 - 111 mmol/L   CO2 27 22 - 32 mmol/L   Glucose, Bld 164 (H) 65 - 99 mg/dL   BUN 8 6 - 20 mg/dL   Creatinine, Ser 0.93 0.44 - 1.00 mg/dL   Calcium 8.5 (L) 8.9 - 10.3 mg/dL   Total Protein 6.3 (L) 6.5 - 8.1 g/dL   Albumin 3.6 3.5 - 5.0 g/dL   AST 29 15 - 41 U/L   ALT 27 14 - 54 U/L   Alkaline Phosphatase 46 38 - 126 U/L   Total Bilirubin 0.8 0.3 - 1.2 mg/dL   GFR calc non Af Amer >60 >  60 mL/min   GFR calc Af Amer >60 >60 mL/min    Comment: (NOTE) The eGFR has been calculated using the CKD EPI equation. This calculation has not been validated in all clinical situations. eGFR's persistently <60 mL/min signify possible Chronic Kidney Disease.    Anion gap 13 5 - 15  Lipid panel     Status: Abnormal   Collection Time: 07/27/16  3:03 AM  Result Value Ref Range   Cholesterol 248 (H) 0 - 200 mg/dL   Triglycerides 161 (H) <150 mg/dL   HDL 86 >40 mg/dL   Total CHOL/HDL Ratio 2.9 RATIO   VLDL 32 0 - 40 mg/dL   LDL  Cholesterol 130 (H) 0 - 99 mg/dL    Comment:        Total Cholesterol/HDL:CHD Risk Coronary Heart Disease Risk Table                     Men   Women  1/2 Average Risk   3.4   3.3  Average Risk       5.0   4.4  2 X Average Risk   9.6   7.1  3 X Average Risk  23.4   11.0        Use the calculated Patient Ratio above and the CHD Risk Table to determine the patient's CHD Risk.        ATP III CLASSIFICATION (LDL):  <100     mg/dL   Optimal  100-129  mg/dL   Near or Above                    Optimal  130-159  mg/dL   Borderline  160-189  mg/dL   High  >190     mg/dL   Very High   Glucose, capillary     Status: Abnormal   Collection Time: 07/27/16  8:02 AM  Result Value Ref Range   Glucose-Capillary 161 (H) 65 - 99 mg/dL  Lipase, blood     Status: Abnormal   Collection Time: 07/27/16  8:56 AM  Result Value Ref Range   Lipase 510 (H) 11 - 51 U/L    Comment: RESULTS CONFIRMED BY MANUAL DILUTION  Glucose, capillary     Status: Abnormal   Collection Time: 07/27/16 11:36 AM  Result Value Ref Range   Glucose-Capillary 176 (H) 65 - 99 mg/dL   Ct Abdomen Pelvis W Contrast  Result Date: 07/26/2016 CLINICAL DATA:  Midline abdominal pain, epigastric pain nausea and vomiting for 2 days EXAM: CT ABDOMEN AND PELVIS WITH CONTRAST TECHNIQUE: Multidetector CT imaging of the abdomen and pelvis was performed using the standard protocol following bolus administration of intravenous contrast. CONTRAST:  100 cc Isovue COMPARISON:  05/01/2013 FINDINGS: Lower chest: Atherosclerotic calcifications of coronary arteries are noted. Heart size within normal limits. Images of the lung bases shows mild dependent atelectasis bilateral lower lobe posteriorly. Hepatobiliary: There is mild fatty infiltration of the liver. No calcified gallstones are noted within gallbladder. Pancreas: There is diffuse mild swelling of the pancreas. Small peripancreatic fluid and stranding noted. Findings are highly suspicious for  recurrent acute pancreatitis. No definite evidence of pancreatic necrosis or pancreatic pseudocyst. Spleen: Enhanced spleen without focal abnormality. Adrenals/Urinary Tract: No adrenal gland mass. Enhanced kidneys are symmetrical in size. No hydronephrosis or hydroureter. Delayed renal images shows bilateral renal symmetrical excretion. The urinary bladder is unremarkable. Stomach/Bowel: There is no small bowel obstruction. There is small amount of fluid surrounding the  duodenum and between pancreatic head and duodenum. This most likely due to secondary inflammatory changes from acute pancreatitis. No small bowel obstruction. No thickened or dilated small bowel loops. No pericecal inflammation. Normal appendix partially visualized in axial image 81. Some colonic stool noted in descending colon and proximal sigmoid colon. No evidence of distal colitis or diverticulitis. No colonic obstruction. Vascular/Lymphatic: No aortic aneurysm. Mild atherosclerotic calcifications of abdominal aorta and iliac arteries. Axial image 34 there is a portal caval lymph node measures 1.5 cm probable reactive. Reproductive: The uterus is anteflexed normal size.  No pelvic mass. Other: No evidence of free abdominal air. Tiny fluid/ stranding in right retroperitoneum and right paracolic gutter probable secondary to acute pancreatitis. Musculoskeletal: No destructive bony lesions are noted. Sagittal images of the spine shows mild degenerative changes lumbar spine. IMPRESSION: 1. There is diffuse mild swelling of the pancreas. Small peripancreatic fluid and stranding noted. Findings are highly suspicious for recurrent acute pancreatitis. No definite evidence of pancreatic necrosis or pancreatic pseudocyst. 2. Mild inflammatory changes/fluid surrounding the duodenum and between duodenum and pancreatic head most likely secondary to pancreatic inflammation rather than duodenitis. Clinical correlation is necessary. 3. No hydronephrosis or  hydroureter. 4. Normal appendix.  No pericecal inflammation. 5. Mild degenerative changes thoracic spine. 6. No small bowel obstruction. Electronically Signed   By: Lahoma Crocker M.D.   On: 07/26/2016 09:27   US Abdomen Limited Ruq  Result Date: 07/26/2016 CLINICAL DATA:  Abdominal pain for 4 days.  Pancreatitis. EXAM: US ABDOMEN LIMITED - RIGHT UPPER QUADRANT COMPARISON:  CT abdomen/ pelvis from earlier today. FINDINGS: Gallbladder: There are a few tiny layering gallstones in the gallbladder measuring up to 6 mm. There is mild layering sludge in the gallbladder. No significant gallbladder distention. No gallbladder wall thickening, pericholecystic fluid or sonographic Murphy's sign. Common bile duct: Diameter: 7 mm. No stones demonstrated in the visualized common bile duct, noting nonvisualization of the distal duct due to overlying bowel gas. Liver: Liver parenchyma is diffusely moderately echogenic with posterior acoustic attenuation, compatible with moderate diffuse hepatic steatosis. No definite liver surface irregularity. No liver mass demonstrated, noting decreased sensitivity in the setting of an echogenic liver. IMPRESSION: 1. Cholelithiasis and gallbladder sludge. No evidence of acute cholecystitis. 2. Mildly dilated common bile duct (7 mm diameter). No sonographic evidence of choledocholithiasis, with limitations as described. Consider correlation with MRI abdomen with MRCP without and with IV contrast, particularly if the bilirubin levels are elevated. 3. Moderate diffuse hepatic steatosis. Electronically Signed   By: Ilona Sorrel M.D.   On: 07/26/2016 12:51      Assessment/Plan  Active Problems:   Diabetes mellitus without complication (HCC)   Essential hypertension, benign   Neuropathic pain of both legs   Pancreatitis   Back pain  Gallstone pancreatitis - will wait for inflammation to calm down prior to surgical intervention. Will monitor lipase and abdominal pain. - - - Recommend  NPO - likely surgery Sunday or Monday pending lipase  We will continue to follow. Thank you for the consult.   Kalman Drape, [facility] Surgery 07/27/2016, 12:22 PM Pager: [phone] Consults: [phone] Mon-Fri 7:00 am-4:30 pm Sat-Sun 7:00 am-11:30 am

## 2016-07-27 NOTE — Progress Notes (Signed)
[facility] PROGRESS NOTE  NAYVIE LIPS WUJ:[id number] DOB: 1960-07-03 DOA: 07/26/2016  PCP: Kerby Nora, MD  Brief History/Interval Summary: 56 year old Caucasian female with a past medical history of diabetes on oral agents, hypertension, previous history of pancreatitis presented with abdominal pain, nausea, vomiting. Patient was diagnosed as having acute pancreatitis. She drinks up to 2 drinks about 3-5 times a week. She did have multiple drinks on the weekend before her symptoms started. She was hospitalized for further management.  Reason for Visit: Acute pancreatitis  Consultants: Gen. surgery  Procedures: None  Antibiotics: None  Subjective/Interval History: Patient feels better this morning. States that most of her abdomen, her back is as she thinks that she may have pulled a muscle. Overall, patient is about 4-6 out of 10 in intensity. Has not had any further episodes of nausea or vomiting. Passing some gas from below.  ROS: Denies any chest pain or shortness of breath.  Objective:  Vital Signs  Vitals:   07/27/16 0435 07/27/16 0438 07/27/16 0645 07/27/16 0819  BP: (!) 159/100 (!) 170/107  (!) 180/104  Pulse: 91   92  Resp: (!) 21  18   Temp: 98.4 F (36.9 C)   99 F (37.2 C)  TempSrc: Oral   Oral  SpO2: 94%  93%   Weight: 84.1 kg (185 lb 6.5 oz)     Height:        Intake/Output Summary (Last 24 hours) at 07/27/16 1304 Last data filed at 07/27/16 0645  Gross per 24 hour  Intake             1650 ml  Output                0 ml  Net             1650 ml   Filed Weights   07/26/16 0456 07/27/16 0435  Weight: 83.9 kg (185 lb) 84.1 kg (185 lb 6.5 oz)    General appearance: alert, cooperative, appears stated age and no distress Resp: clear to auscultation bilaterally Cardio: regular rate and rhythm, S1, S2 normal, no murmur, click, rub or gallop Back: No obvious deformity identified. Able to move her legs without any difficulties. GI: Abdomen  is soft. There is tenderness in the upper abdomen in the epigastric right upper quadrant areas. No rebound, rigidity or guarding. No masses or organomegaly. Bowel sounds are present but sluggish. Extremities: extremities normal, atraumatic, no cyanosis or edema Neurologic: Awake, alert. Oriented 3. No focal neurological deficits.  Lab Results:  Data Reviewed: I have personally reviewed following labs and imaging studies  CBC:  Recent Labs Lab 07/26/16 0543 07/27/16 0303  WBC 5.4 6.3  HGB 13.1 12.4  HCT 39.0 37.2  MCV 97.0 97.4  PLT 137* 125*    Basic Metabolic Panel:  Recent Labs Lab 07/26/16 0543 07/27/16 0303  NA 142 142  K 4.6 4.6  CL 99* 102  CO2 26 27  GLUCOSE 231* 164*  BUN <5* 8  CREATININE 0.76 0.93  CALCIUM 9.4 8.5*    GFR: Estimated Creatinine Clearance: 81.8 mL/min (by C-G formula based on SCr of 0.93 mg/dL).  Liver Function Tests:  Recent Labs Lab 07/26/16 0543 07/27/16 0303  AST 60* 29  ALT 40 27  ALKPHOS 53 46  BILITOT 0.9 0.8  PROT 7.1 6.3*  ALBUMIN 4.1 3.6     Recent Labs Lab 07/26/16 0543 07/27/16 0856  LIPASE 852* 510*   CBG:  Recent Labs Lab 07/26/16  1346 07/26/16 2107 07/27/16 0802 07/27/16 1136  GLUCAP 207* 231* 161* 176*    Lipid Profile:  Recent Labs  07/27/16 0303  CHOL 248*  HDL 86  LDLCALC 130*  TRIG 161*  CHOLHDL 2.9    Radiology Studies: Ct Abdomen Pelvis W Contrast  Result Date: 07/26/2016 CLINICAL DATA:  Midline abdominal pain, epigastric pain nausea and vomiting for 2 days EXAM: CT ABDOMEN AND PELVIS WITH CONTRAST TECHNIQUE: Multidetector CT imaging of the abdomen and pelvis was performed using the standard protocol following bolus administration of intravenous contrast. CONTRAST:  100 cc Isovue COMPARISON:  05/01/2013 FINDINGS: Lower chest: Atherosclerotic calcifications of coronary arteries are noted. Heart size within normal limits. Images of the lung bases shows mild dependent atelectasis  bilateral lower lobe posteriorly. Hepatobiliary: There is mild fatty infiltration of the liver. No calcified gallstones are noted within gallbladder. Pancreas: There is diffuse mild swelling of the pancreas. Small peripancreatic fluid and stranding noted. Findings are highly suspicious for recurrent acute pancreatitis. No definite evidence of pancreatic necrosis or pancreatic pseudocyst. Spleen: Enhanced spleen without focal abnormality. Adrenals/Urinary Tract: No adrenal gland mass. Enhanced kidneys are symmetrical in size. No hydronephrosis or hydroureter. Delayed renal images shows bilateral renal symmetrical excretion. The urinary bladder is unremarkable. Stomach/Bowel: There is no small bowel obstruction. There is small amount of fluid surrounding the duodenum and between pancreatic head and duodenum. This most likely due to secondary inflammatory changes from acute pancreatitis. No small bowel obstruction. No thickened or dilated small bowel loops. No pericecal inflammation. Normal appendix partially visualized in axial image 81. Some colonic stool noted in descending colon and proximal sigmoid colon. No evidence of distal colitis or diverticulitis. No colonic obstruction. Vascular/Lymphatic: No aortic aneurysm. Mild atherosclerotic calcifications of abdominal aorta and iliac arteries. Axial image 34 there is a portal caval lymph node measures 1.5 cm probable reactive. Reproductive: The uterus is anteflexed normal size.  No pelvic mass. Other: No evidence of free abdominal air. Tiny fluid/ stranding in right retroperitoneum and right paracolic gutter probable secondary to acute pancreatitis. Musculoskeletal: No destructive bony lesions are noted. Sagittal images of the spine shows mild degenerative changes lumbar spine. IMPRESSION: 1. There is diffuse mild swelling of the pancreas. Small peripancreatic fluid and stranding noted. Findings are highly suspicious for recurrent acute pancreatitis. No definite  evidence of pancreatic necrosis or pancreatic pseudocyst. 2. Mild inflammatory changes/fluid surrounding the duodenum and between duodenum and pancreatic head most likely secondary to pancreatic inflammation rather than duodenitis. Clinical correlation is necessary. 3. No hydronephrosis or hydroureter. 4. Normal appendix.  No pericecal inflammation. 5. Mild degenerative changes thoracic spine. 6. No small bowel obstruction. Electronically Signed   By: Natasha Mead M.D.   On: 07/26/2016 09:27   US Abdomen Limited Ruq  Result Date: 07/26/2016 CLINICAL DATA:  Abdominal pain for 4 days.  Pancreatitis. EXAM: US ABDOMEN LIMITED - RIGHT UPPER QUADRANT COMPARISON:  CT abdomen/ pelvis from earlier today. FINDINGS: Gallbladder: There are a few tiny layering gallstones in the gallbladder measuring up to 6 mm. There is mild layering sludge in the gallbladder. No significant gallbladder distention. No gallbladder wall thickening, pericholecystic fluid or sonographic Murphy's sign. Common bile duct: Diameter: 7 mm. No stones demonstrated in the visualized common bile duct, noting nonvisualization of the distal duct due to overlying bowel gas. Liver: Liver parenchyma is diffusely moderately echogenic with posterior acoustic attenuation, compatible with moderate diffuse hepatic steatosis. No definite liver surface irregularity. No liver mass demonstrated, noting decreased sensitivity in the setting  of an echogenic liver. IMPRESSION: 1. Cholelithiasis and gallbladder sludge. No evidence of acute cholecystitis. 2. Mildly dilated common bile duct (7 mm diameter). No sonographic evidence of choledocholithiasis, with limitations as described. Consider correlation with MRI abdomen with MRCP without and with IV contrast, particularly if the bilirubin levels are elevated. 3. Moderate diffuse hepatic steatosis. Electronically Signed   By: Delbert PhenixJason A Poff M.D.   On: 07/26/2016 12:51     Medications:  Scheduled: . enoxaparin (LOVENOX)  injection  40 mg Subcutaneous Q24H  . famotidine (PEPCID) IV  20 mg Intravenous Q12H  . gabapentin  300 mg Oral QHS  . insulin aspart  0-5 Units Subcutaneous QHS  . insulin aspart  0-9 Units Subcutaneous TID WC  . lisinopril  40 mg Oral Daily  . metoCLOPramide (REGLAN) injection  10 mg Intravenous Once  . metoprolol tartrate  50 mg Oral BID   Continuous: . sodium chloride 125 mL/hr (07/27/16 0624)  . fentaNYL     ZOX:WRUEAVWUJPRN:diltiazem, diphenhydrAMINE **OR** diphenhydrAMINE, hydrALAZINE, ketorolac, methocarbamol (ROBAXIN)  IV, naloxone **AND** sodium chloride flush, ondansetron **OR** ondansetron (ZOFRAN) IV  Assessment/Plan:  Active Problems:   Diabetes mellitus without complication (HCC)   Essential hypertension, benign   Neuropathic pain of both legs   Pancreatitis   Back pain    Acute Pancreatitis Could be secondary to alcohol, or biliary. Ultrasound does show gallstones. Patient states that she has cut down significantly on her alcohol consumption from the past. Continue ice chips only for now. Continue IV fluids. Pain control. Triglycerides 161. Lipase has improved though still remains elevated. General surgery consult. She may eventually need cholecystectomy. Bilirubin is normal. Alkaline phosphatase is normal. Mildly dilated CBD was noted previously as well.  Back pain Likely from muscle spasm w/ referred pancreatic pain. Pt w/ longstanding h/o msk pain. Continue muscle relaxants. K Pad.  Accelerated hypertension  Blood pressure is poorly controlled. Patient states that her blood pressure is usually well controlled on once a day, lisinopril. She has been compliant with her medications. Last dose was on Wednesday. She will be given hydralazine as needed. Continue with lisinopril for now. Add metoprolol.   Diabetes mellitus type 2  Continue sliding scale coverage. Holding metformin.   Chronic pain Continue neurontin  Hyperlipidemia LDL noted to be 130. She will benefit  from being on a statin medication. Considering history of diabetes. However , there is mention of intolerance to simvastatin. This can be addressed further as outpatient by her PCP.   DVT Prophylaxis: Lovenox    Code Status: Full code. This was verified with patient. Family Communication: Discussed with the patient and her husband  Disposition Plan: Management as outlined above. Await improvement in pancreatitis.    LOS: 1 day   [facility] CampusKRISHNAN,Krisna Omar  [facility] Pager [phone] 07/27/2016, 1:04 PM  If 7PM-7AM, please contact night-coverage at [email or web address], password [email or web address]

## 2016-07-27 NOTE — Progress Notes (Addendum)
Inpatient Diabetes Program Recommendations  AACE/ADA: New Consensus Statement on Inpatient Glycemic Control (2015)  Target Ranges:  Prepandial:   less than 140 mg/dL      Peak postprandial:   less than 180 mg/dL (1-2 hours)      Critically ill patients:  140 - 180 mg/dL   Lab Results  Component Value Date   GLUCAP 176 (H) 07/27/2016   HGBA1C 6.0 02/27/2016    Review of Glycemic ControlResults for Odelia GageMCNULTY, Vieno L (MRN [id number]) as of 07/27/2016 13:04  Ref. Range 07/26/2016 13:46 07/26/2016 21:07 07/27/2016 08:02 07/27/2016 11:36  Glucose-Capillary Latest Ref Range: 65 - 99 mg/dL 409207 (H) 811231 (H) 914161 (H) 176 (H)   Home medications: Metformin XR 1000 mg q AM and 500 mg q PM Inpatient Diabetes Program Recommendations:    Patient is NPO.  Please consider increasing frequency of Novolog correction to q 4 hours.   Thanks, Beryl MeagerJenny Jenascia Bumpass, RN, BC-ADM Inpatient Diabetes Coordinator Pager [phone] (8a-5p)

## 2016-07-27 NOTE — Plan of Care (Signed)
Problem: Pain Managment: Goal: General experience of comfort will improve Patient is still c/o pain to her back and abdomen. Patient is on fentanyl PCA intermittently.  She states the pain is much better and rates it 5/10.

## 2016-07-28 ENCOUNTER — Inpatient Hospital Stay (HOSPITAL_COMMUNITY): Payer: 59 | Admitting: Certified Registered Nurse Anesthetist

## 2016-07-28 ENCOUNTER — Encounter (HOSPITAL_COMMUNITY)
Admission: EM | Disposition: A | Discharge status: Home / Self Care | Payer: Self-pay | Source: Home / Self Care | Attending: Internal Medicine

## 2016-07-28 ENCOUNTER — Inpatient Hospital Stay (HOSPITAL_COMMUNITY): Payer: 59

## 2016-07-28 ENCOUNTER — Encounter (HOSPITAL_COMMUNITY): Payer: Self-pay | Admitting: Certified Registered"

## 2016-07-28 HISTORY — PX: CHOLECYSTECTOMY: SHX55

## 2016-07-28 LAB — COMPREHENSIVE METABOLIC PANEL
ALK PHOS: 41 U/L (ref 38–126)
ALT: 22 U/L (ref 14–54)
AST: 21 U/L (ref 15–41)
Albumin: 3.1 g/dL — ABNORMAL LOW (ref 3.5–5.0)
Anion gap: 12 (ref 5–15)
BUN: 11 mg/dL (ref 6–20)
CALCIUM: 8 mg/dL — AB (ref 8.9–10.3)
CO2: 24 mmol/L (ref 22–32)
CREATININE: 0.75 mg/dL (ref 0.44–1.00)
Chloride: 106 mmol/L (ref 101–111)
GFR calc non Af Amer: >60 mL/min (ref >60)
GLUCOSE: 135 mg/dL — AB (ref 65–99)
Potassium: 4 mmol/L (ref 3.5–5.1)
SODIUM: 142 mmol/L (ref 135–145)
Total Bilirubin: 1.3 mg/dL — ABNORMAL HIGH (ref 0.3–1.2)
Total Protein: 6.2 g/dL — ABNORMAL LOW (ref 6.5–8.1)

## 2016-07-28 LAB — GLUCOSE, CAPILLARY
GLUCOSE-CAPILLARY: 140 mg/dL — AB (ref 65–99)
GLUCOSE-CAPILLARY: 146 mg/dL — AB (ref 65–99)
GLUCOSE-CAPILLARY: 151 mg/dL — AB (ref 65–99)
GLUCOSE-CAPILLARY: 157 mg/dL — AB (ref 65–99)
Glucose-Capillary: 212 mg/dL — ABNORMAL HIGH (ref 65–99)

## 2016-07-28 LAB — CBC
HCT: 35.1 % — ABNORMAL LOW (ref 36.0–46.0)
HEMOGLOBIN: 11.4 g/dL — AB (ref 12.0–15.0)
MCH: 31.9 pg (ref 26.0–34.0)
MCHC: 32.5 g/dL (ref 30.0–36.0)
MCV: 98.3 fL (ref 78.0–100.0)
Platelets: 123 10*3/uL — ABNORMAL LOW (ref 150–400)
RBC: 3.57 MIL/uL — AB (ref 3.87–5.11)
RDW: 13.5 % (ref 11.5–15.5)
WBC: 9.8 10*3/uL (ref 4.0–10.5)

## 2016-07-28 LAB — LIPASE, BLOOD: Lipase: 187 U/L — ABNORMAL HIGH (ref 11–51)

## 2016-07-28 SURGERY — LAPAROSCOPIC CHOLECYSTECTOMY WITH INTRAOPERATIVE CHOLANGIOGRAM
Anesthesia: General | Site: Abdomen

## 2016-07-28 MED ORDER — PROPOFOL 10 MG/ML IV BOLUS
INTRAVENOUS | Status: DC | PRN
Start: 2016-07-28 — End: 2016-07-28
  Administered 2016-07-28: 150 mg via INTRAVENOUS

## 2016-07-28 MED ORDER — HYDROCODONE-ACETAMINOPHEN 5-325 MG PO TABS
1.0000 | ORAL_TABLET | ORAL | Status: DC | PRN
  Administered 2016-07-29 – 2016-07-30 (×4): 1 via ORAL
  Filled 2016-07-28 (×4): qty 1

## 2016-07-28 MED ORDER — FENTANYL CITRATE (PF) 100 MCG/2ML IJ SOLN
INTRAMUSCULAR | Status: AC
  Filled 2016-07-28: qty 2

## 2016-07-28 MED ORDER — SALINE SPRAY 0.65 % NA SOLN
2.0000 | Freq: Three times a day (TID) | NASAL | Status: DC
  Administered 2016-07-28 – 2016-07-30 (×3): 2 via NASAL
  Filled 2016-07-28: qty 44

## 2016-07-28 MED ORDER — ROCURONIUM BROMIDE 100 MG/10ML IV SOLN
INTRAVENOUS | Status: DC | PRN
Start: 2016-07-28 — End: 2016-07-28
  Administered 2016-07-28: 50 mg via INTRAVENOUS

## 2016-07-28 MED ORDER — FENTANYL CITRATE (PF) 100 MCG/2ML IJ SOLN
INTRAMUSCULAR | Status: DC | PRN
  Administered 2016-07-28: 100 ug via INTRAVENOUS
  Administered 2016-07-28: 50 ug via INTRAVENOUS
  Administered 2016-07-28: 100 ug via INTRAVENOUS
  Administered 2016-07-28: 50 ug via INTRAVENOUS

## 2016-07-28 MED ORDER — MIDAZOLAM HCL 2 MG/2ML IJ SOLN
INTRAMUSCULAR | Status: DC | PRN
  Administered 2016-07-28: 2 mg via INTRAVENOUS

## 2016-07-28 MED ORDER — HYDROMORPHONE HCL 1 MG/ML IJ SOLN
1.0000 mg | INTRAMUSCULAR | Status: DC | PRN
  Administered 2016-07-28 – 2016-07-29 (×3): 1 mg via INTRAVENOUS
  Filled 2016-07-28 (×3): qty 1

## 2016-07-28 MED ORDER — CEFAZOLIN (ANCEF) 1 G IV SOLR: 2.0000 g | INTRAVENOUS | Status: DC

## 2016-07-28 MED ORDER — IOPAMIDOL (ISOVUE-300) INJECTION 61%
INTRAVENOUS | Status: AC
  Filled 2016-07-28: qty 50

## 2016-07-28 MED ORDER — PHENYLEPHRINE HCL 10 MG/ML IJ SOLN
INTRAMUSCULAR | Status: DC | PRN
  Administered 2016-07-28: 80 ug via INTRAVENOUS

## 2016-07-28 MED ORDER — LIDOCAINE HCL (CARDIAC) 20 MG/ML IV SOLN
INTRAVENOUS | Status: DC | PRN
  Administered 2016-07-28: 60 mg via INTRAVENOUS

## 2016-07-28 MED ORDER — ONDANSETRON HCL 4 MG/2ML IJ SOLN
INTRAMUSCULAR | Status: AC
  Filled 2016-07-28: qty 2

## 2016-07-28 MED ORDER — IOPAMIDOL (ISOVUE-300) INJECTION 61%
INTRAVENOUS | Status: DC | PRN
  Administered 2016-07-28: 6 mL

## 2016-07-28 MED ORDER — CEFAZOLIN SODIUM-DEXTROSE 2-4 GM/100ML-% IV SOLN
2.0000 g | INTRAVENOUS | Status: AC
  Administered 2016-07-28: 2 g via INTRAVENOUS
  Filled 2016-07-28 (×2): qty 100

## 2016-07-28 MED ORDER — DEXAMETHASONE SODIUM PHOSPHATE 10 MG/ML IJ SOLN
INTRAMUSCULAR | Status: DC | PRN
  Administered 2016-07-28: 10 mg via INTRAVENOUS

## 2016-07-28 MED ORDER — HYDROMORPHONE HCL 1 MG/ML IJ SOLN: 0.2500 mg | INTRAMUSCULAR | Status: DC | PRN

## 2016-07-28 MED ORDER — SCOPOLAMINE 1 MG/3DAYS TD PT72
MEDICATED_PATCH | TRANSDERMAL | Status: AC
  Filled 2016-07-28: qty 1

## 2016-07-28 MED ORDER — SCOPOLAMINE 1 MG/3DAYS TD PT72
MEDICATED_PATCH | TRANSDERMAL | Status: DC | PRN
  Administered 2016-07-28: 1 via TRANSDERMAL

## 2016-07-28 MED ORDER — PROMETHAZINE HCL 25 MG/ML IJ SOLN: 6.2500 mg | INTRAMUSCULAR | Status: DC | PRN

## 2016-07-28 MED ORDER — 0.9 % SODIUM CHLORIDE (POUR BTL) OPTIME
TOPICAL | Status: DC | PRN
  Administered 2016-07-28: 1000 mL

## 2016-07-28 MED ORDER — MIDAZOLAM HCL 2 MG/2ML IJ SOLN
INTRAMUSCULAR | Status: AC
  Filled 2016-07-28: qty 2

## 2016-07-28 MED ORDER — ONDANSETRON HCL 4 MG/2ML IJ SOLN
INTRAMUSCULAR | Status: DC | PRN
  Administered 2016-07-28: 4 mg via INTRAVENOUS

## 2016-07-28 MED ORDER — SUGAMMADEX SODIUM 200 MG/2ML IV SOLN
INTRAVENOUS | Status: AC
  Filled 2016-07-28: qty 2

## 2016-07-28 MED ORDER — LACTATED RINGERS IV SOLN
INTRAVENOUS | Status: DC
  Administered 2016-07-28: 10:00:00 via INTRAVENOUS

## 2016-07-28 MED ORDER — SUGAMMADEX SODIUM 200 MG/2ML IV SOLN
INTRAVENOUS | Status: DC | PRN
  Administered 2016-07-28: 170 mg via INTRAVENOUS

## 2016-07-28 MED ORDER — PHENYLEPHRINE 40 MCG/ML (10ML) SYRINGE FOR IV PUSH (FOR BLOOD PRESSURE SUPPORT)
PREFILLED_SYRINGE | INTRAVENOUS | Status: AC
  Filled 2016-07-28: qty 10

## 2016-07-28 MED ORDER — PROPOFOL 10 MG/ML IV BOLUS
INTRAVENOUS | Status: AC
Start: 2016-07-28 — End: 2016-07-28
  Filled 2016-07-28: qty 60

## 2016-07-28 MED ORDER — PROPOFOL 10 MG/ML IV BOLUS
INTRAVENOUS | Status: AC
  Filled 2016-07-28: qty 20

## 2016-07-28 MED ORDER — LIDOCAINE 2% (20 MG/ML) 5 ML SYRINGE
INTRAMUSCULAR | Status: AC
  Filled 2016-07-28: qty 5

## 2016-07-28 MED ORDER — BUPIVACAINE HCL (PF) 0.25 % IJ SOLN
INTRAMUSCULAR | Status: AC
  Filled 2016-07-28: qty 30

## 2016-07-28 MED ORDER — DEXAMETHASONE SODIUM PHOSPHATE 10 MG/ML IJ SOLN
INTRAMUSCULAR | Status: AC
  Filled 2016-07-28: qty 1

## 2016-07-28 MED ORDER — FENTANYL CITRATE (PF) 100 MCG/2ML IJ SOLN
INTRAMUSCULAR | Status: AC
  Filled 2016-07-28: qty 4

## 2016-07-28 MED ORDER — BUPIVACAINE HCL 0.25 % IJ SOLN
INTRAMUSCULAR | Status: DC | PRN
  Administered 2016-07-28: 15 mL

## 2016-07-28 MED ORDER — ROCURONIUM BROMIDE 50 MG/5ML IV SOSY
PREFILLED_SYRINGE | INTRAVENOUS | Status: AC
  Filled 2016-07-28: qty 5

## 2016-07-28 SURGICAL SUPPLY — 40 items
APPLIER CLIP ROT 10 11.4 M/L (STAPLE)
BLADE CLIPPER SURG (BLADE) IMPLANT
CANISTER SUCT 3000ML PPV (MISCELLANEOUS) ×2 IMPLANT
CATH CHOLANG 76X19 KUMAR (CATHETERS) ×2 IMPLANT
CHLORAPREP W/TINT 26ML (MISCELLANEOUS) ×2 IMPLANT
CLIP APPLIE ROT 10 11.4 M/L (STAPLE) IMPLANT
CLIP LIGATING HEMO LOK XL GOLD (MISCELLANEOUS) ×2 IMPLANT
CLIP LIGATING HEMO O LOK GREEN (MISCELLANEOUS) IMPLANT
COVER MAYO STAND STRL (DRAPES) ×2 IMPLANT
COVER SURGICAL LIGHT HANDLE (MISCELLANEOUS) ×2 IMPLANT
DERMABOND ADVANCED (GAUZE/BANDAGES/DRESSINGS) ×1
DERMABOND ADVANCED .7 DNX12 (GAUZE/BANDAGES/DRESSINGS) ×1 IMPLANT
DRAPE C-ARM 42X72 X-RAY (DRAPES) ×2 IMPLANT
ELECT REM PT RETURN 9FT ADLT (ELECTROSURGICAL) ×2
ELECTRODE REM PT RTRN 9FT ADLT (ELECTROSURGICAL) ×1 IMPLANT
GLOVE BIOGEL PI IND STRL 7.0 (GLOVE) ×1 IMPLANT
GLOVE BIOGEL PI INDICATOR 7.0 (GLOVE) ×1
GLOVE SURG SS PI 7.0 STRL IVOR (GLOVE) ×2 IMPLANT
GOWN STRL REUS W/ TWL LRG LVL3 (GOWN DISPOSABLE) ×3 IMPLANT
GOWN STRL REUS W/TWL LRG LVL3 (GOWN DISPOSABLE) ×3
GRASPER SUT TROCAR 14GX15 (MISCELLANEOUS) ×2 IMPLANT
KIT BASIN OR (CUSTOM PROCEDURE TRAY) ×2 IMPLANT
KIT ROOM TURNOVER OR (KITS) ×2 IMPLANT
NS IRRIG 1000ML POUR BTL (IV SOLUTION) ×2 IMPLANT
PAD ARMBOARD 7.5X6 YLW CONV (MISCELLANEOUS) ×2 IMPLANT
POUCH RETRIEVAL ECOSAC 10 (ENDOMECHANICALS) ×1 IMPLANT
POUCH RETRIEVAL ECOSAC 10MM (ENDOMECHANICALS) ×1
SCISSORS LAP 5X35 DISP (ENDOMECHANICALS) ×2 IMPLANT
SET CHOLANGIOGRAPH 5 50 .035 (SET/KITS/TRAYS/PACK) ×2 IMPLANT
SET IRRIG TUBING LAPAROSCOPIC (IRRIGATION / IRRIGATOR) ×2 IMPLANT
SLEEVE ENDOPATH XCEL 5M (ENDOMECHANICALS) ×4 IMPLANT
SPECIMEN JAR SMALL (MISCELLANEOUS) ×2 IMPLANT
STOPCOCK 4 WAY LG BORE MALE ST (IV SETS) ×2 IMPLANT
SUT MNCRL AB 4-0 PS2 18 (SUTURE) ×2 IMPLANT
TOWEL OR 17X24 6PK STRL BLUE (TOWEL DISPOSABLE) ×2 IMPLANT
TOWEL OR 17X26 10 PK STRL BLUE (TOWEL DISPOSABLE) ×2 IMPLANT
TRAY LAPAROSCOPIC MC (CUSTOM PROCEDURE TRAY) ×2 IMPLANT
TROCAR XCEL 12X100 BLDLESS (ENDOMECHANICALS) ×2 IMPLANT
TROCAR XCEL NON-BLD 5MMX100MML (ENDOMECHANICALS) ×2 IMPLANT
TUBING INSUFFLATION (TUBING) ×2 IMPLANT

## 2016-07-28 NOTE — Progress Notes (Signed)
[facility] PROGRESS NOTE  Mallory Stewart ZOX:[id number] DOB: [birth year] DOA: 07/26/2016  PCP: Kerby Nora, MD  Brief History/Interval Summary: 56 year old Caucasian female with a past medical history of diabetes on oral agents, hypertension, previous history of pancreatitis presented with abdominal pain, nausea, vomiting. Patient was diagnosed as having acute pancreatitis. She drinks up to 2 drinks about 3-5 times a week. She did have multiple drinks on the weekend before her symptoms started. She was hospitalized for further management. Evaluation revealed gallstones , raising concern for biliary pancreatitis. General surgery was consulted.  Reason for Visit: Acute pancreatitis  Consultants: Gen. surgery  Procedures: None  Antibiotics: None  Subjective/Interval History: Patient feels much better this morning. Her abdominal pain has improved, although she still requires fentanyl. The back pain has significantly improved with muscle relaxants and K pad.   ROS: Denies any chest pain or shortness of breath.  Objective:  Vital Signs  Vitals:   07/28/16 0400 07/28/16 0645 07/28/16 0647 07/28/16 0826  BP:    (!) 175/93  Pulse:    93  Resp:  (!) 22 (!) 22   Temp: 99.2 F (37.3 C)   100.1 F (37.8 C)  TempSrc: Oral   Oral  SpO2:  94% 94% 95%  Weight: 84.2 kg (185 lb 10 oz)     Height:        Intake/Output Summary (Last 24 hours) at 07/28/16 0855 Last data filed at 07/28/16 0256  Gross per 24 hour  Intake          1329.17 ml  Output              550 ml  Net           779.17 ml   Filed Weights   07/26/16 0456 07/27/16 0435 07/28/16 0400  Weight: 83.9 kg (185 lb) 84.1 kg (185 lb 6.5 oz) 84.2 kg (185 lb 10 oz)    General appearance: alert, cooperative, appears stated age and no distress Resp: clear to auscultation bilaterally Cardio: regular rate and rhythm, S1, S2 normal, no murmur, click, rub or gallop GI: Abdomen is soft. Improved tenderness, though still  tender in the upper abdomen without any rebound, rigidity or guarding. More bowel sounds heard today. No masses or organomegaly.  Extremities: extremities normal, atraumatic, no cyanosis or edema Neurologic: Awake, alert. Oriented 3. No focal neurological deficits.  Lab Results:  Data Reviewed: I have personally reviewed following labs and imaging studies  CBC:  Recent Labs Lab 07/26/16 0543 07/27/16 0303 07/28/16 0320  WBC 5.4 6.3 9.8  HGB 13.1 12.4 11.4*  HCT 39.0 37.2 35.1*  MCV 97.0 97.4 98.3  PLT 137* 125* 123*    Basic Metabolic Panel:  Recent Labs Lab 07/26/16 0543 07/27/16 0303 07/28/16 0320  NA 142 142 142  K 4.6 4.6 4.0  CL 99* 102 106  CO2 26 27 24   GLUCOSE 231* 164* 135*  BUN <5* 8 11  CREATININE 0.76 0.93 0.75  CALCIUM 9.4 8.5* 8.0*    GFR: Estimated Creatinine Clearance: 95.1 mL/min (by C-G formula based on SCr of 0.75 mg/dL).  Liver Function Tests:  Recent Labs Lab 07/26/16 0543 07/27/16 0303 07/28/16 0320  AST 60* 29 21  ALT 40 27 22  ALKPHOS 53 46 41  BILITOT 0.9 0.8 1.3*  PROT 7.1 6.3* 6.2*  ALBUMIN 4.1 3.6 3.1*     Recent Labs Lab 07/26/16 0543 07/27/16 0856 07/28/16 0320  LIPASE 852* 510* 187*   CBG:  Recent Labs Lab 07/27/16 0802 07/27/16 1136 07/27/16 1719 07/27/16 2121 07/28/16 0728  GLUCAP 161* 176* 146* 137* 140*    Lipid Profile:  Recent Labs  07/27/16 0303  CHOL 248*  HDL 86  LDLCALC 130*  TRIG 161*  CHOLHDL 2.9    Radiology Studies: Ct Abdomen Pelvis W Contrast  Result Date: 07/26/2016 CLINICAL DATA:  Midline abdominal pain, epigastric pain nausea and vomiting for 2 days EXAM: CT ABDOMEN AND PELVIS WITH CONTRAST TECHNIQUE: Multidetector CT imaging of the abdomen and pelvis was performed using the standard protocol following bolus administration of intravenous contrast. CONTRAST:  100 cc Isovue COMPARISON:  05/01/2013 FINDINGS: Lower chest: Atherosclerotic calcifications of coronary arteries are  noted. Heart size within normal limits. Images of the lung bases shows mild dependent atelectasis bilateral lower lobe posteriorly. Hepatobiliary: There is mild fatty infiltration of the liver. No calcified gallstones are noted within gallbladder. Pancreas: There is diffuse mild swelling of the pancreas. Small peripancreatic fluid and stranding noted. Findings are highly suspicious for recurrent acute pancreatitis. No definite evidence of pancreatic necrosis or pancreatic pseudocyst. Spleen: Enhanced spleen without focal abnormality. Adrenals/Urinary Tract: No adrenal gland mass. Enhanced kidneys are symmetrical in size. No hydronephrosis or hydroureter. Delayed renal images shows bilateral renal symmetrical excretion. The urinary bladder is unremarkable. Stomach/Bowel: There is no small bowel obstruction. There is small amount of fluid surrounding the duodenum and between pancreatic head and duodenum. This most likely due to secondary inflammatory changes from acute pancreatitis. No small bowel obstruction. No thickened or dilated small bowel loops. No pericecal inflammation. Normal appendix partially visualized in axial image 81. Some colonic stool noted in descending colon and proximal sigmoid colon. No evidence of distal colitis or diverticulitis. No colonic obstruction. Vascular/Lymphatic: No aortic aneurysm. Mild atherosclerotic calcifications of abdominal aorta and iliac arteries. Axial image 34 there is a portal caval lymph node measures 1.5 cm probable reactive. Reproductive: The uterus is anteflexed normal size.  No pelvic mass. Other: No evidence of free abdominal air. Tiny fluid/ stranding in right retroperitoneum and right paracolic gutter probable secondary to acute pancreatitis. Musculoskeletal: No destructive bony lesions are noted. Sagittal images of the spine shows mild degenerative changes lumbar spine. IMPRESSION: 1. There is diffuse mild swelling of the pancreas. Small peripancreatic fluid and  stranding noted. Findings are highly suspicious for recurrent acute pancreatitis. No definite evidence of pancreatic necrosis or pancreatic pseudocyst. 2. Mild inflammatory changes/fluid surrounding the duodenum and between duodenum and pancreatic head most likely secondary to pancreatic inflammation rather than duodenitis. Clinical correlation is necessary. 3. No hydronephrosis or hydroureter. 4. Normal appendix.  No pericecal inflammation. 5. Mild degenerative changes thoracic spine. 6. No small bowel obstruction. Electronically Signed   By: Natasha Mead M.D.   On: 07/26/2016 09:27   US Abdomen Limited Ruq  Result Date: 07/26/2016 CLINICAL DATA:  Abdominal pain for 4 days.  Pancreatitis. EXAM: US ABDOMEN LIMITED - RIGHT UPPER QUADRANT COMPARISON:  CT abdomen/ pelvis from earlier today. FINDINGS: Gallbladder: There are a few tiny layering gallstones in the gallbladder measuring up to 6 mm. There is mild layering sludge in the gallbladder. No significant gallbladder distention. No gallbladder wall thickening, pericholecystic fluid or sonographic Murphy's sign. Common bile duct: Diameter: 7 mm. No stones demonstrated in the visualized common bile duct, noting nonvisualization of the distal duct due to overlying bowel gas. Liver: Liver parenchyma is diffusely moderately echogenic with posterior acoustic attenuation, compatible with moderate diffuse hepatic steatosis. No definite liver surface irregularity. No liver mass  demonstrated, noting decreased sensitivity in the setting of an echogenic liver. IMPRESSION: 1. Cholelithiasis and gallbladder sludge. No evidence of acute cholecystitis. 2. Mildly dilated common bile duct (7 mm diameter). No sonographic evidence of choledocholithiasis, with limitations as described. Consider correlation with MRI abdomen with MRCP without and with IV contrast, particularly if the bilirubin levels are elevated. 3. Moderate diffuse hepatic steatosis. Electronically Signed   By: Delbert PhenixJason A  Poff M.D.   On: 07/26/2016 12:51     Medications:  Scheduled: . ceFAZolin  2 g Other To OR  . enoxaparin (LOVENOX) injection  40 mg Subcutaneous Q24H  . famotidine (PEPCID) IV  20 mg Intravenous Q12H  . gabapentin  300 mg Oral QHS  . insulin aspart  0-5 Units Subcutaneous QHS  . insulin aspart  0-9 Units Subcutaneous TID WC  . lisinopril  40 mg Oral Daily  . metoCLOPramide (REGLAN) injection  10 mg Intravenous Once  . metoprolol tartrate  50 mg Oral BID  . sodium chloride  2 spray Each Nare TID   Continuous: . sodium chloride 125 mL/hr (07/28/16 0720)  . fentaNYL     ZOX:WRUEAVWUJPRN:diltiazem, diphenhydrAMINE **OR** diphenhydrAMINE, hydrALAZINE, ketorolac, methocarbamol (ROBAXIN)  IV, naloxone **AND** sodium chloride flush, ondansetron **OR** ondansetron (ZOFRAN) IV  Assessment/Plan:  Active Problems:   Diabetes mellitus without complication (HCC)   Essential hypertension, benign   Neuropathic pain of both legs   Pancreatitis   Back pain    Acute Pancreatitis, Likely biliary Ultrasound does show gallstones. Patient states that she has cut down significantly on her alcohol consumption from the past. Lipase levels have improved. Triglyceride is 161. IV fluids. General surgery is seeing the patient and plan is for laparoscopic cholecystectomy. Will need intraoperative cholangiogram. Mildly dilated CBD was noted previously as well. Should be able to get her off of the fentanyl PCA later today. Low-grade fever most likely due to inflammation. Patient feels well otherwise without any symptoms suggestive of any infection. Continue to monitor.  Back pain Likely from muscle spasm w/ referred pancreatic pain. Pt w/ longstanding h/o msk pain. Improved with muscle relaxants. K Pad.  Accelerated hypertension  Blood pressure has improved compared to yesterday. Elevation in blood pressure, most likely secondary to pain. Patient does have a known history of hypertension and is on lisinopril. She  reports compliance with medications. Lisinopril was resumed. Beta blocker was added.    Diabetes mellitus type 2  Continue sliding scale coverage. Holding metformin.   Chronic pain Continue neurontin  Hyperlipidemia LDL noted to be 130. She will benefit from being on a statin medication. Considering history of diabetes. However , there is mention of intolerance to simvastatin. This can be addressed further as outpatient by her PCP.   DVT Prophylaxis: Lovenox    Code Status: Full code.  Family Communication: Discussed with the patient and her husband  Disposition Plan: Management as outlined above. Await improvement. Plan is for laparoscopic cholecystectomy.    LOS: 2 days   [facility] LLCKRISHNAN,Esteven Overfelt  [facility] Pager [phone] 07/28/2016, 8:55 AM  If 7PM-7AM, please contact night-coverage at [email or web address], password [email or web address]

## 2016-07-28 NOTE — Op Note (Signed)
PATIENT:  Mallory GageKathleen L Stewart  56 y.o. female  PRE-OPERATIVE DIAGNOSIS:  pancreatitis  POST-OPERATIVE DIAGNOSIS:  pancreatitis  PROCEDURE:  Procedure(s): LAPAROSCOPIC CHOLECYSTECTOMY WITH INTRAOPERATIVE CHOLANGIOGRAM   SURGEON:  Surgeon(s): Rodman PickleLuke Aaron Kinsinger, MD Violeta GelinasBurke Thompson, MD  ASSISTANT: Violeta GelinasBurke Thompson  ANESTHESIA:   local and general  Indications for procedure: Mallory GageKathleen L Stewart is a 56 y.o. female with symptoms of Abdominal pain consistent with gallbladder disease, Confirmed by Ultrasound and CT.  Description of procedure: The patient was brought into the operative suite, placed supine. Anesthesia was administered with endotracheal tube. Patient was strapped in place and foot board was secured. All pressure points were offloaded by foam padding. The patient was prepped and draped in the usual sterile fashion.  A small incision was made to the right of the umbilicus. A 5mm trocar was inserted into the peritoneal cavity with optical entry. Pneumoperitoneum was applied with high flow low pressure. 2 5mm trocars were placed in the RUQ. A 12mm trocar was placed in the subxiphoid space. All trocars sites were first anesthesized with 0.25% marcaine with epinephrine in the subcutaneous and preperitoneal layers. Next the patient was placed in reverse trendelenberg. The gallbladder was edematous with multiple adhesions of the omentum to the gallbladder wall. These adhesions were taken down with cautery.  The gallbladder was retracted cephalad and lateral. The peritoneum was reflected off the infundibulum working lateral to medial. "The cystic duct and cystic artery were identified and further dissection revealed a critical view, due to concern for choledocholithiasis a cholangiogram was performed with ductotomy and cook catheter passed through a separate subcostal stab incision. No filling defects were found. The cystic duct and cystic artery were doubly clipped and ligated.   The  gallbladder was removed off the liver bed with cautery. The Gallbladder was placed in a specimen bag. The gallbladder fossa was irrigated and hemostasis was applied with cautery. The gallbladder was removed via the 12mm trocar. No dilation was required for removal, therefore no fascial closure was performed. Pneumoperitoneum was removed, all trocar were removed. All incisions were closed with 4-0 monocryl subcuticular stitch. The patient woke from anesthesia and was brought to PACU in stable condition. All counts were correct  Findings: inflamed gallbladder, normal ductal anatomy  Specimen: gallbladder  Blood loss: No intake/output data recorded. ml  Local anesthesia: 15ml 0.25% marcaine  Complications: none  PLAN OF CARE: Admit to inpatient   PATIENT DISPOSITION:  PACU - hemodynamically stable.  Feliciana RossettiLuke Kinsinger, M.D. General, Bariatric, & Minimally Invasive Surgery [facility]

## 2016-07-28 NOTE — Anesthesia Preprocedure Evaluation (Signed)
Anesthesia Evaluation  Patient identified by MRN, date of birth, ID band Patient awake    Reviewed: Allergy & Precautions, NPO status , Patient's Chart, lab work & pertinent test results  History of Anesthesia Complications (+) PONV  Airway Mallampati: II  TM Distance: >3 FB Neck ROM: Full    Dental  (+) Dental Advisory Given   Pulmonary neg pulmonary ROS,    breath sounds clear to auscultation       Cardiovascular hypertension, Pt. on medications  Rhythm:Regular Rate:Normal     Neuro/Psych negative neurological ROS     GI/Hepatic negative GI ROS, Neg liver ROS,   Endo/Other  diabetes, Type 2, Oral Hypoglycemic Agents  Renal/GU negative Renal ROS     Musculoskeletal   Abdominal   Peds  Hematology  (+) anemia ,   Anesthesia Other Findings   Reproductive/Obstetrics                             Lab Results  Component Value Date   WBC 9.8 07/28/2016   HGB 11.4 (L) 07/28/2016   HCT 35.1 (L) 07/28/2016   MCV 98.3 07/28/2016   PLT 123 (L) 07/28/2016   Lab Results  Component Value Date   CREATININE 0.75 07/28/2016   BUN 11 07/28/2016   NA 142 07/28/2016   K 4.0 07/28/2016   CL 106 07/28/2016   CO2 24 07/28/2016    Anesthesia Physical Anesthesia Plan  ASA: II  Anesthesia Plan: General   Post-op Pain Management:    Induction: Intravenous  Airway Management Planned: Oral ETT  Additional Equipment:   Intra-op Plan:   Post-operative Plan: Extubation in OR  Informed Consent: I have reviewed the patients History and Physical, chart, labs and discussed the procedure including the risks, benefits and alternatives for the proposed anesthesia with the patient or authorized representative who has indicated his/her understanding and acceptance.   Dental advisory given  Plan Discussed with: CRNA  Anesthesia Plan Comments:         Anesthesia Quick Evaluation

## 2016-07-28 NOTE — Anesthesia Procedure Notes (Signed)
Procedure Name: Intubation Date/Time: 07/28/2016 12:53 PM Performed by: Barrington Ellison Pre-anesthesia Checklist: Patient identified, Emergency Drugs available, Suction available and Patient being monitored Patient Re-evaluated:Patient Re-evaluated prior to inductionOxygen Delivery Method: Circle System Utilized Preoxygenation: Pre-oxygenation with 100% oxygen Intubation Type: IV induction Ventilation: Mask ventilation without difficulty Laryngoscope Size: Mac and 3 Grade View: Grade I Tube type: Oral Number of attempts: 1 Airway Equipment and Method: Stylet Placement Confirmation: ETT inserted through vocal cords under direct vision,  positive ETCO2 and breath sounds checked- equal and bilateral Secured at: 21 cm Tube secured with: Tape Dental Injury: Teeth and Oropharynx as per pre-operative assessment

## 2016-07-28 NOTE — Transfer of Care (Signed)
Immediate Anesthesia Transfer of Care Note  Patient: Mallory Stewart  Procedure(s) Performed: Procedure(s): LAPAROSCOPIC CHOLECYSTECTOMY WITH INTRAOPERATIVE CHOLANGIOGRAM (N/A)  Patient Location: PACU  Anesthesia Type:General  Level of Consciousness: awake, alert  and oriented  Airway & Oxygen Therapy: Patient Spontanous Breathing and Patient connected to nasal cannula oxygen  Post-op Assessment: Report given to RN and Patient moving all extremities X 4  Post vital signs: Reviewed and stable  Last Vitals:  Vitals:   07/28/16 0826 07/28/16 0949  BP: (!) 175/93   Pulse: 93   Resp:  20  Temp: 37.8 C     Last Pain:  Vitals:   07/28/16 0949  TempSrc:   PainSc: 0-No pain      Patients Stated Pain Goal: 0 (07/27/16 0840)  Complications: No apparent anesthesia complications

## 2016-07-28 NOTE — Plan of Care (Signed)
Problem: Pain Managment: Goal: General experience of comfort will improve Outcome: Progressing Patient continues on PCA fentanyl.  She states her pain is much better; rates it 3/10.

## 2016-07-28 NOTE — Progress Notes (Signed)
  Progress Note: General Surgery Service   Subjective: Pain improved, still requiring fentanyl  Objective: Vital signs in last 24 hours: Temp:  [99 F (37.2 C)-99.5 F (37.5 C)] 99.2 F (37.3 C) (03/24 0400) Pulse Rate:  [79-92] 90 (03/23 2015) Resp:  [17-22] 22 (03/24 0647) BP: (133-180)/(91-112) 168/103 (03/24 0049) SpO2:  [93 %-97 %] 94 % (03/24 0647) Weight:  [84.2 kg (185 lb 10 oz)] 84.2 kg (185 lb 10 oz) (03/24 0400) Last BM Date: 07/25/16  Intake/Output from previous day: 03/23 0701 - 03/24 0700 In: 1569.2 [P.O.:240; I.V.:1229.2; IV Piggyback:100] Out: 550 [Urine:550] Intake/Output this shift: No intake/output data recorded.  Lungs: CTAB  Cardiovascular: RRR  Abd: soft, slight epigastric tenderness  Extremities: no edema  Neuro: AOx4  Lab Results: CBC   Recent Labs  07/27/16 0303 07/28/16 0320  WBC 6.3 9.8  HGB 12.4 11.4*  HCT 37.2 35.1*  PLT 125* 123*   BMET  Recent Labs  07/27/16 0303 07/28/16 0320  NA 142 142  K 4.6 4.0  CL 102 106  CO2 27 24  GLUCOSE 164* 135*  BUN 8 11  CREATININE 0.93 0.75  CALCIUM 8.5* 8.0*   PT/INR No results for input(s): LABPROT, INR in the last 72 hours. ABG No results for input(s): PHART, HCO3 in the last 72 hours.  Invalid input(s): PCO2, PO2  Studies/Results:  Anti-infectives: Anti-infectives    None      Medications: Scheduled Meds: . enoxaparin (LOVENOX) injection  40 mg Subcutaneous Q24H  . famotidine (PEPCID) IV  20 mg Intravenous Q12H  . gabapentin  300 mg Oral QHS  . insulin aspart  0-5 Units Subcutaneous QHS  . insulin aspart  0-9 Units Subcutaneous TID WC  . lisinopril  40 mg Oral Daily  . metoCLOPramide (REGLAN) injection  10 mg Intravenous Once  . metoprolol tartrate  50 mg Oral BID   Continuous Infusions: . sodium chloride 125 mL/hr (07/28/16 0720)  . fentaNYL     PRN Meds:.diltiazem, diphenhydrAMINE **OR** diphenhydrAMINE, hydrALAZINE, ketorolac, methocarbamol (ROBAXIN)  IV,  naloxone **AND** sodium chloride flush, ondansetron **OR** ondansetron (ZOFRAN) IV  Assessment/Plan: Patient Active Problem List   Diagnosis Date Noted  . Pancreatitis 07/26/2016  . Back pain 07/26/2016  . Hyperkalemia 03/02/2016  . Post concussive syndrome 11/23/2015  . B12 deficiency 10/21/2014  . Neuropathic pain of both legs 02/11/2014  . Pancreatic pseudocyst/cyst 05/06/2013  . Fatty liver 04/27/2013  . Elevated liver enzymes 02/05/2011  . EXCESSIVE OR FREQUENT MENSTRUATION 05/10/2009  . Diabetes mellitus without complication (HCC) 11/03/2008  . HYPERCHOLESTEROLEMIA 11/03/2008  . Essential hypertension, benign 09/29/2008  . PAP SMEAR, ABNORMAL 09/29/2008   s/p Procedure(s): LAPAROSCOPIC CHOLECYSTECTOMY WITH INTRAOPERATIVE CHOLANGIOGRAM 07/28/2016 -labs improved -OR today    LOS: 2 days   Rodman PickleLuke Aaron Kinsinger, MD Pg# [phone] [facility]

## 2016-07-28 NOTE — Progress Notes (Signed)
Fentanyl PCA order d/c'd. Remainder 24 ml (6440mcg/ml) of Fentanyl syringe wasted in sink with Charlane FerrettiKristen West, Charity fundraiserN.

## 2016-07-28 NOTE — Progress Notes (Signed)
Consent for Surgery and Blood Consent explained. Verbalized understanding. No questions or concerns expressed. Consent signed. CHG bath complete. Telemetry removed. IVF saline locked and PCA stopped. Down to OR via pt bed with OR techs.

## 2016-07-28 NOTE — Anesthesia Postprocedure Evaluation (Signed)
Anesthesia Post Note  Patient: Odelia GageKathleen L Maule  Procedure(s) Performed: Procedure(s) (LRB): LAPAROSCOPIC CHOLECYSTECTOMY WITH INTRAOPERATIVE CHOLANGIOGRAM (N/A)  Patient location during evaluation: PACU Anesthesia Type: General Level of consciousness: awake and alert Pain management: pain level controlled Vital Signs Assessment: post-procedure vital signs reviewed and stable Respiratory status: spontaneous breathing, nonlabored ventilation, respiratory function stable and patient connected to nasal cannula oxygen Cardiovascular status: blood pressure returned to baseline and stable Postop Assessment: no signs of nausea or vomiting Anesthetic complications: no       Last Vitals:  Vitals:   07/28/16 1420 07/28/16 1422  BP:  (!) 160/98  Pulse:  86  Resp:  14  Temp: 36.3 C     Last Pain:  Vitals:   07/28/16 0949  TempSrc:   PainSc: 0-No pain                 Kennieth RadFitzgerald, Kyesha Balla E

## 2016-07-29 ENCOUNTER — Encounter (HOSPITAL_COMMUNITY): Payer: Self-pay | Admitting: General Surgery

## 2016-07-29 DIAGNOSIS — K8020 Calculus of gallbladder without cholecystitis without obstruction: Secondary | ICD-10-CM

## 2016-07-29 LAB — GLUCOSE, CAPILLARY
GLUCOSE-CAPILLARY: 118 mg/dL — AB (ref 65–99)
GLUCOSE-CAPILLARY: 99 mg/dL (ref 65–99)
Glucose-Capillary: 135 mg/dL — ABNORMAL HIGH (ref 65–99)
Glucose-Capillary: 148 mg/dL — ABNORMAL HIGH (ref 65–99)

## 2016-07-29 LAB — COMPREHENSIVE METABOLIC PANEL
ALBUMIN: 2.8 g/dL — AB (ref 3.5–5.0)
ALT: 22 U/L (ref 14–54)
ANION GAP: 12 (ref 5–15)
AST: 32 U/L (ref 15–41)
Alkaline Phosphatase: 45 U/L (ref 38–126)
BILIRUBIN TOTAL: 0.9 mg/dL (ref 0.3–1.2)
BUN: 16 mg/dL (ref 6–20)
CHLORIDE: 105 mmol/L (ref 101–111)
CO2: 21 mmol/L — AB (ref 22–32)
Calcium: 7.5 mg/dL — ABNORMAL LOW (ref 8.9–10.3)
Creatinine, Ser: 0.86 mg/dL (ref 0.44–1.00)
GFR calc Af Amer: >60 mL/min (ref >60)
GFR calc non Af Amer: >60 mL/min (ref >60)
GLUCOSE: 157 mg/dL — AB (ref 65–99)
POTASSIUM: 4.4 mmol/L (ref 3.5–5.1)
SODIUM: 138 mmol/L (ref 135–145)
TOTAL PROTEIN: 5.8 g/dL — AB (ref 6.5–8.1)

## 2016-07-29 LAB — CBC
HEMATOCRIT: 30.8 % — AB (ref 36.0–46.0)
Hemoglobin: 10 g/dL — ABNORMAL LOW (ref 12.0–15.0)
MCH: 32.2 pg (ref 26.0–34.0)
MCHC: 32.5 g/dL (ref 30.0–36.0)
MCV: 99 fL (ref 78.0–100.0)
PLATELETS: 108 10*3/uL — AB (ref 150–400)
RBC: 3.11 MIL/uL — ABNORMAL LOW (ref 3.87–5.11)
RDW: 13.6 % (ref 11.5–15.5)
WBC: 5.3 10*3/uL (ref 4.0–10.5)

## 2016-07-29 NOTE — Progress Notes (Signed)
[facility] PROGRESS NOTE  Mallory Stewart JWJ:[id number] DOB: Jun 22, 1960 DOA: 07/26/2016  PCP: Kerby Nora, MD  Brief History/Interval Summary: 55 year old Caucasian female with a past medical history of diabetes on oral agents, hypertension, previous history of pancreatitis presented with abdominal pain, nausea, vomiting. Patient was diagnosed as having acute pancreatitis. She drinks up to 2 drinks about 3-5 times a week. She did have multiple drinks on the weekend before her symptoms started. She was hospitalized for further management. Evaluation revealed gallstones , raising concern for biliary pancreatitis. General surgery was consulted. Patient underwent laparoscopic cholecystectomy.  Reason for Visit: Acute pancreatitis  Consultants: Gen. surgery  Procedures: Laparoscopic cholecystectomy  Antibiotics: None  Subjective/Interval History: Patient feels well. Less abdominal pain today compared to yesterday. Back pain has resolved. Pain is controlled just with oral medications.  ROS: Denies any chest pain or shortness of breath.  Objective:  Vital Signs  Vitals:   07/28/16 2031 07/28/16 2143 07/29/16 0038 07/29/16 0336  BP: 130/87 131/86 118/81 (!) 134/96  Pulse: (!) 106 99 64 61  Resp: 16   16  Temp: 99.3 F (37.4 C)  98.4 F (36.9 C) 98.7 F (37.1 C)  TempSrc: Oral  Oral Oral  SpO2: 95%  96% 93%  Weight:    88.2 kg (194 lb 8 oz)  Height:        Intake/Output Summary (Last 24 hours) at 07/29/16 0829 Last data filed at 07/29/16 0651  Gross per 24 hour  Intake             1550 ml  Output               10 ml  Net             1540 ml   Filed Weights   07/27/16 0435 07/28/16 0400 07/29/16 0336  Weight: 84.1 kg (185 lb 6.5 oz) 84.2 kg (185 lb 10 oz) 88.2 kg (194 lb 8 oz)    General appearance: alert, cooperative, appears stated age and no distress Resp: clear to auscultation bilaterally Cardio: regular rate and rhythm, S1, S2 normal, no murmur, click,  rub or gallop GI: Abdomen is soft. Improved tenderness, though still tender in the upper abdomen without any rebound, rigidity or guarding. No masses or organomegaly. Bowel sounds present, though sluggish. Extremities: extremities normal, atraumatic, no cyanosis or edema Neurologic: Awake, alert. Oriented 3. No focal neurological deficits.  Lab Results:  Data Reviewed: I have personally reviewed following labs and imaging studies  CBC:  Recent Labs Lab 07/26/16 0543 07/27/16 0303 07/28/16 0320 07/29/16 0317  WBC 5.4 6.3 9.8 5.3  HGB 13.1 12.4 11.4* 10.0*  HCT 39.0 37.2 35.1* 30.8*  MCV 97.0 97.4 98.3 99.0  PLT 137* 125* 123* 108*    Basic Metabolic Panel:  Recent Labs Lab 07/26/16 0543 07/27/16 0303 07/28/16 0320 07/29/16 0317  NA 142 142 142 138  K 4.6 4.6 4.0 4.4  CL 99* 102 106 105  CO2 26 27 24  21*  GLUCOSE 231* 164* 135* 157*  BUN <5* 8 11 16   CREATININE 0.76 0.93 0.75 0.86  CALCIUM 9.4 8.5* 8.0* 7.5*    GFR: Estimated Creatinine Clearance: 90.4 mL/min (by C-G formula based on SCr of 0.86 mg/dL).  Liver Function Tests:  Recent Labs Lab 07/26/16 0543 07/27/16 0303 07/28/16 0320 07/29/16 0317  AST 60* 29 21 32  ALT 40 27 22 22   ALKPHOS 53 46 41 45  BILITOT 0.9 0.8 1.3* 0.9  PROT 7.1  6.3* 6.2* 5.8*  ALBUMIN 4.1 3.6 3.1* 2.8*     Recent Labs Lab 07/26/16 0543 07/27/16 0856 07/28/16 0320  LIPASE 852* 510* 187*   CBG:  Recent Labs Lab 07/28/16 1017 07/28/16 1354 07/28/16 1612 07/28/16 2032 07/29/16 0742  GLUCAP 146* 151* 157* 212* 135*    Lipid Profile:  Recent Labs  07/27/16 0303  CHOL 248*  HDL 86  LDLCALC 130*  TRIG 161*  CHOLHDL 2.9    Radiology Studies: Dg Cholangiogram Operative  Result Date: 07/28/2016 CLINICAL DATA:  56 year old female with a history of acute cholecystitis EXAM: INTRAOPERATIVE CHOLANGIOGRAM TECHNIQUE: Cholangiographic images from the C-arm fluoroscopic device were submitted for interpretation  post-operatively. Please see the procedural report for the amount of contrast and the fluoroscopy time utilized. COMPARISON:  Ultrasound 07/26/2016 FINDINGS: Surgical instruments project over the upper abdomen. There is cannulation of the cystic duct/gallbladder neck, with antegrade infusion of contrast. Caliber of the extrahepatic ductal system within normal limits. No large filling defect identified. Free flow of contrast across the ampulla. IMPRESSION: Intraoperative cholangiogram demonstrates extrahepatic biliary ducts of unremarkable caliber, with no large filling defect identified. Free flow of contrast across the ampulla. Please refer to the dictated operative report for full details of intraoperative findings and procedure Electronically Signed   By: Gilmer Mor D.O.   On: 07/28/2016 13:55     Medications:  Scheduled: . enoxaparin (LOVENOX) injection  40 mg Subcutaneous Q24H  . famotidine (PEPCID) IV  20 mg Intravenous Q12H  . gabapentin  300 mg Oral QHS  . insulin aspart  0-5 Units Subcutaneous QHS  . insulin aspart  0-9 Units Subcutaneous TID WC  . lisinopril  40 mg Oral Daily  . metoCLOPramide (REGLAN) injection  10 mg Intravenous Once  . metoprolol tartrate  50 mg Oral BID  . sodium chloride  2 spray Each Nare TID   Continuous: . sodium chloride 125 mL (07/29/16 0610)  . lactated ringers 10 mL/hr at 07/28/16 1024   RUE:[id number], hydrALAZINE, HYDROcodone-acetaminophen, HYDROmorphone (DILAUDID) injection, ketorolac, methocarbamol (ROBAXIN)  IV, ondansetron **OR** ondansetron (ZOFRAN) IV  Assessment/Plan:  Active Problems:   Diabetes mellitus without complication (HCC)   Essential hypertension, benign   Neuropathic pain of both legs   Pancreatitis   Back pain    Acute Pancreatitis, Likely biliary Ultrasound did show gallstones. Patient states that she has cut down significantly on her alcohol consumption from the past. Lipase levels have improved. Triglyceride is 161.  Cut back on IV fluids. General surgery was consulted due to concern for biliary etiology. Patient underwent laparoscopic cholecystectomy. Intraoperative cholangiogram did not show any concerns. Mildly dilated CBD seen on ultrasound was noted previously as well. Pain is well controlled. Mobilize. Diet advancement per general surgery.   Back pain Likely from muscle spasm w/ referred pancreatic pain. Pt w/ longstanding h/o msk pain. Improved with muscle relaxants. K Pad.  Accelerated hypertension  Blood pressure has improved significantly. Elevation in blood pressure, most likely secondary to pain. Patient does have a known history of hypertension and is on lisinopril. She reports compliance with medications. Lisinopril was resumed. Beta blocker was added.    Diabetes mellitus type 2  Continue sliding scale coverage. Holding metformin.   Chronic pain Continue neurontin  Hyperlipidemia LDL noted to be 130. She will benefit from being on a statin medication. Considering history of diabetes. However , there is mention of intolerance to simvastatin. This can be addressed further as outpatient by her PCP.   DVT Prophylaxis: Lovenox  Code Status: Full code.  Family Communication: Discussed with the patient and her husband  Disposition Plan: Management as outlined above. Mobilize. Diet to be advanced today. Anticipate discharge tomorrow.    LOS: 3 days   [facility] Surgery CenterKRISHNAN,Christropher Gintz  [facility] Pager [phone] 07/29/2016, 8:29 AM  If 7PM-7AM, please contact night-coverage at [email or web address], password [email or web address]

## 2016-07-29 NOTE — Plan of Care (Signed)
Problem: Pain Managment: Goal: General experience of comfort will improve Outcome: Progressing Tolerates PO diet. Ambulates in hallway.

## 2016-07-29 NOTE — Progress Notes (Signed)
1 Day Post-Op  Subjective: tol clears  Objective: Vital signs in last 24 hours: Temp:  [97.3 F (36.3 C)-99.3 F (37.4 C)] 98.7 F (37.1 C) (03/25 0336) Pulse Rate:  [61-106] 61 (03/25 0336) Resp:  [14-20] 16 (03/25 0336) BP: (118-160)/(72-98) 134/96 (03/25 0336) SpO2:  [90 %-97 %] 93 % (03/25 0336) Weight:  [88.2 kg (194 lb 8 oz)] 88.2 kg (194 lb 8 oz) (03/25 0336) Last BM Date: 07/25/16  Intake/Output from previous day: 03/24 0701 - 03/25 0700 In: 1550 [I.V.:1500; IV Piggyback:50] Out: 10 [Blood:10] Intake/Output this shift: No intake/output data recorded.  GI: soft, incisions CDI  Lab Results:   Recent Labs  07/28/16 0320 07/29/16 0317  WBC 9.8 5.3  HGB 11.4* 10.0*  HCT 35.1* 30.8*  PLT 123* 108*   BMET  Recent Labs  07/28/16 0320 07/29/16 0317  NA 142 138  K 4.0 4.4  CL 106 105  CO2 24 21*  GLUCOSE 135* 157*  BUN 11 16  CREATININE 0.75 0.86  CALCIUM 8.0* 7.5*   PT/INR No results for input(s): LABPROT, INR in the last 72 hours. ABG No results for input(s): PHART, HCO3 in the last 72 hours.  Invalid input(s): PCO2, PO2  Studies/Results: Dg Cholangiogram Operative  Result Date: 07/28/2016 CLINICAL DATA:  56 year old female with a history of acute cholecystitis EXAM: INTRAOPERATIVE CHOLANGIOGRAM TECHNIQUE: Cholangiographic images from the C-arm fluoroscopic device were submitted for interpretation post-operatively. Please see the procedural report for the amount of contrast and the fluoroscopy time utilized. COMPARISON:  Ultrasound 07/26/2016 FINDINGS: Surgical instruments project over the upper abdomen. There is cannulation of the cystic duct/gallbladder neck, with antegrade infusion of contrast. Caliber of the extrahepatic ductal system within normal limits. No large filling defect identified. Free flow of contrast across the ampulla. IMPRESSION: Intraoperative cholangiogram demonstrates extrahepatic biliary ducts of unremarkable caliber, with no large  filling defect identified. Free flow of contrast across the ampulla. Please refer to the dictated operative report for full details of intraoperative findings and procedure Electronically Signed   By: Gilmer MorJaime  Wagner D.O.   On: 07/28/2016 13:55    Anti-infectives: Anti-infectives    Start     Dose/Rate Route Frequency Ordered Stop   07/28/16 0930  ceFAZolin (ANCEF) IVPB 2g/100 mL premix     2 g 200 mL/hr over 30 Minutes Intravenous To [facility] 07/28/16 0928 07/28/16 1310   07/28/16 0815  ceFAZolin (ANCEF) powder 2 g  Status:  Discontinued     2 g Other To Surgery 07/28/16 0807 07/28/16 0928      Assessment/Plan: s/p Procedure(s): LAPAROSCOPIC CHOLECYSTECTOMY WITH INTRAOPERATIVE CHOLANGIOGRAM (N/A) POD#1 Adv to heart healthy diet  LOS: 3 days    Brooklyne Radke E 07/29/2016

## 2016-07-30 DIAGNOSIS — K851 Biliary acute pancreatitis without necrosis or infection: Secondary | ICD-10-CM

## 2016-07-30 LAB — COMPREHENSIVE METABOLIC PANEL
ALT: 24 U/L (ref 14–54)
ANION GAP: 13 (ref 5–15)
AST: 36 U/L (ref 15–41)
Albumin: 2.8 g/dL — ABNORMAL LOW (ref 3.5–5.0)
Alkaline Phosphatase: 55 U/L (ref 38–126)
BILIRUBIN TOTAL: 1.1 mg/dL (ref 0.3–1.2)
BUN: 20 mg/dL (ref 6–20)
CHLORIDE: 105 mmol/L (ref 101–111)
CO2: 20 mmol/L — ABNORMAL LOW (ref 22–32)
Calcium: 7.4 mg/dL — ABNORMAL LOW (ref 8.9–10.3)
Creatinine, Ser: 0.97 mg/dL (ref 0.44–1.00)
GFR calc non Af Amer: >60 mL/min (ref >60)
Glucose, Bld: 116 mg/dL — ABNORMAL HIGH (ref 65–99)
POTASSIUM: 3.8 mmol/L (ref 3.5–5.1)
Sodium: 138 mmol/L (ref 135–145)
Total Protein: 5.3 g/dL — ABNORMAL LOW (ref 6.5–8.1)

## 2016-07-30 LAB — CBC
HEMATOCRIT: 31.8 % — AB (ref 36.0–46.0)
Hemoglobin: 10.2 g/dL — ABNORMAL LOW (ref 12.0–15.0)
MCH: 31.2 pg (ref 26.0–34.0)
MCHC: 32.1 g/dL (ref 30.0–36.0)
MCV: 97.2 fL (ref 78.0–100.0)
PLATELETS: 122 10*3/uL — AB (ref 150–400)
RBC: 3.27 MIL/uL — ABNORMAL LOW (ref 3.87–5.11)
RDW: 13 % (ref 11.5–15.5)
WBC: 7.3 10*3/uL (ref 4.0–10.5)

## 2016-07-30 LAB — GLUCOSE, CAPILLARY: Glucose-Capillary: 91 mg/dL (ref 65–99)

## 2016-07-30 LAB — LIPASE, BLOOD: Lipase: 76 U/L — ABNORMAL HIGH (ref 11–51)

## 2016-07-30 MED ORDER — OXYCODONE-ACETAMINOPHEN 5-325 MG PO TABS: 1.0000 | ORAL_TABLET | Freq: Four times a day (QID) | ORAL | 0 refills | Status: DC | PRN

## 2016-07-30 MED ORDER — DOCUSATE SODIUM 100 MG PO CAPS: 100.0000 mg | ORAL_CAPSULE | Freq: Two times a day (BID) | ORAL | 0 refills | Status: AC

## 2016-07-30 MED ORDER — METHOCARBAMOL 500 MG PO TABS: 500.0000 mg | ORAL_TABLET | Freq: Three times a day (TID) | ORAL | 0 refills | Status: DC | PRN

## 2016-07-30 MED ORDER — METOPROLOL TARTRATE 50 MG PO TABS: 50.0000 mg | ORAL_TABLET | Freq: Two times a day (BID) | ORAL | 0 refills | Status: DC

## 2016-07-30 MED ORDER — FAMOTIDINE 20 MG PO TABS
20.0000 mg | ORAL_TABLET | Freq: Two times a day (BID) | ORAL | Status: DC
  Administered 2016-07-30: 20 mg via ORAL
  Filled 2016-07-30: qty 1

## 2016-07-30 NOTE — Discharge Instructions (Signed)
Low-Fat Diet for Pancreatitis or Gallbladder Conditions A low-fat diet can be helpful if you have pancreatitis or a gallbladder condition. With these conditions, your pancreas and gallbladder have trouble digesting fats. A healthy eating plan with less fat will help rest your pancreas and gallbladder and reduce your symptoms. What do I need to know about this diet?  Eat a low-fat diet.  Reduce your fat intake to less than 20-30% of your total daily calories. This is less than 50-60 g of fat per day.  Remember that you need some fat in your diet. Ask your dietician what your daily goal should be.  Choose nonfat and low-fat healthy foods. Look for the words nonfat, low fat, or fat free.  As a guide, look on the label and choose foods with less than 3 g of fat per serving. Eat only one serving.  Avoid alcohol.  Do not smoke. If you need help quitting, talk with your health care provider.  Eat small frequent meals instead of three large heavy meals. What foods can I eat? Grains  Include healthy grains and starches such as potatoes, wheat bread, fiber-rich cereal, and brown rice. Choose whole grain options whenever possible. In adults, whole grains should account for 45-65% of your daily calories. Fruits and Vegetables  Eat plenty of fruits and vegetables. Fresh fruits and vegetables add fiber to your diet. Meats and Other Protein Sources  Eat lean meat such as chicken and pork. Trim any fat off of meat before cooking it. Eggs, fish, and beans are other sources of protein. In adults, these foods should account for 10-35% of your daily calories. Dairy  Choose low-fat milk and dairy options. Dairy includes fat and protein, as well as calcium. Fats and Oils  Limit high-fat foods such as fried foods, sweets, baked goods, sugary drinks. Other  Creamy sauces and condiments, such as mayonnaise, can add extra fat. Think about whether or not you need to use them, or use smaller amounts or  low fat options. What foods are not recommended?  High fat foods, such as:  [employer].  Ice cream.  [place] toast.  Sweet rolls.  Pizza.  Cheese bread.  Foods covered with batter, butter, creamy sauces, or cheese.  Fried foods.  Sugary drinks and desserts.  Foods that cause gas or bloating This information is not intended to replace advice given to you by your health care provider. Make sure you discuss any questions you have with your health care provider. Document Released: 04/28/2013 Document Revised: 09/29/2015 Document Reviewed: 04/06/2013 Elsevier Interactive Patient Education  2017 [employer].   Acute Pancreatitis Acute pancreatitis is a condition in which the pancreas suddenly gets irritated and swollen (has inflammation). The pancreas is a large gland behind the stomach. It makes enzymes that help to digest food. The pancreas also makes hormones that help to control your blood sugar. Acute pancreatitis happens when the enzymes attack the pancreas and damage it. Most attacks last a couple of days and can cause serious problems. Follow these instructions at home: Eating and drinking   Follow instructions from your doctor about diet. You may need to:  Avoid alcohol.  Limit how much fat is in your diet.  Eat small meals often. Avoid eating big meals.  Drink enough fluid to keep your pee (urine) clear or pale yellow.  Do not drink alcohol if it caused your condition. General instructions   Take over-the-counter and prescription medicines only as told by your doctor.  Do not  use any tobacco products. These include cigarettes, chewing tobacco, and e-cigarettes. If you need help quitting, ask your doctor.  Get plenty of rest.  If directed, check your blood sugar at home as told by your doctor.  Keep all follow-up visits as told by your doctor. This is important. Contact a doctor if:  You do not get better as quickly as expected.  You have new  symptoms.  Your symptoms get worse.  You have lasting pain or weakness.  You continue to feel sick to your stomach (nauseous).  You get better and then you have another pain attack.  You have a fever. Get help right away if:  You cannot eat or keep fluids down.  Your pain becomes very bad.  Your skin or the white part of your eyes turns yellow (jaundice).  You throw up (vomit).  You feel dizzy or you pass out (faint).  Your blood sugar is high (over 300 mg/dL). This information is not intended to replace advice given to you by your health care provider. Make sure you discuss any questions you have with your health care provider. Document Released: 10/10/2007 Document Revised: 09/29/2015 Document Reviewed: 01/25/2015 Elsevier Interactive Patient Education  2017 Elsevier Inc.   [facility] ______[facility] LAPAROSCOPIC SURGERY: POST OP INSTRUCTIONS Always review your discharge instruction sheet given to you by the facility where your surgery was performed. IF YOU HAVE DISABILITY OR FAMILY LEAVE FORMS, YOU MUST BRING THEM TO THE OFFICE FOR PROCESSING.   DO NOT GIVE THEM TO YOUR DOCTOR.  1. A prescription for pain medication may be given to you upon discharge.  Take your pain medication as prescribed, if needed.  If narcotic pain medicine is not needed, then you may take acetaminophen (Tylenol) or ibuprofen (Advil) as needed. 2. Take your usually prescribed medications unless otherwise directed. 3. If you need a refill on your pain medication, please contact your pharmacy.  They will contact our office to request authorization. Prescriptions will not be filled after 5pm or on week-ends. 4. You should follow a light diet the first few days after arrival home, such as soup and crackers, etc.  Be sure to include lots of fluids daily. 5. Most patients will experience some swelling and bruising in the area of the incisions.  Ice packs will help.  Swelling and bruising can  take several days to resolve.  6. It is common to experience some constipation if taking pain medication after surgery.  Increasing fluid intake and taking a stool softener (such as Colace) will usually help or prevent this problem from occurring.  A mild laxative (Milk of Magnesia or Miralax) should be taken according to package instructions if there are no bowel movements after 48 hours. 7. Unless discharge instructions indicate otherwise, you may remove your bandages 24-48 hours after surgery, and you may shower at that time.  You may have steri-strips (small skin tapes) in place directly over the incision.  These strips should be left on the skin for 7-10 days.  If your surgeon used skin glue on the incision, you may shower in 24 hours.  The glue will flake off over the next 2-3 weeks.  Any sutures or staples will be removed at the office during your follow-up visit. 8. ACTIVITIES:  You may resume regular (light) daily activities beginning the next day--such as daily self-care, walking, climbing stairs--gradually increasing activities as tolerated.  You may have sexual intercourse when it is comfortable.  Refrain from any heavy lifting  or straining until approved by your doctor. a. You may drive when you are no longer taking prescription pain medication, you can comfortably wear a seatbelt, and you can safely maneuver your car and apply brakes. b. RETURN TO WORK:  __________________________________________________________ 9. You should see your doctor in the office for a follow-up appointment approximately 2-3 weeks after your surgery.  Make sure that you call for this appointment within a day or two after you arrive home to insure a convenient appointment time. 10. OTHER INSTRUCTIONS: __________________________________________________________________________________________________________________________  __________________________________________________________________________________________________________________________ WHEN TO CALL YOUR DOCTOR: 1. Fever over 101.0 2. Inability to urinate 3. Continued bleeding from incision. 4. Increased pain, redness, or drainage from the incision. 5. Increasing abdominal pain  The clinic staff is available to answer your questions during regular business hours.  Please dont hesitate to call and ask to speak to one of the nurses for clinical concerns.  If you have a medical emergency, go to the nearest emergency room or call 911.  [facility] is always on call at the hospital. [address], [address], [place], [place]  [id number] ? [address] [address], [place], [place]   [id number] [phone] ? [phone] ? FAX [phone] Web site: [email or web address]

## 2016-07-30 NOTE — Plan of Care (Signed)
Problem: Pain Managment: Goal: General experience of comfort will improve Outcome: Not Progressing Patient still c/o back pain 8/10 and abdominal discomfort. PRN Norco given; it was effective

## 2016-07-30 NOTE — Progress Notes (Signed)
Pt discharged to home, condition stable, discharge education complete, printed prescriptions given to patient, left facility via w/c accompanied by spouse.  Raymon MuttonGwen Marieta Markov RN

## 2016-07-30 NOTE — Progress Notes (Signed)
2 Days Post-Op  Subjective: tol PO. No nausea or emesis. Still having back spasms. Ready to go home.  Objective: Vital signs in last 24 hours: Temp:  [97.4 F (36.3 C)-98.9 F (37.2 C)] 98.9 F (37.2 C) (03/26 0457) Pulse Rate:  [62-77] 77 (03/26 0457) Resp:  [20] 20 (03/25 1400) BP: (152-166)/(96-101) 166/101 (03/26 0457) SpO2:  [95 %-97 %] 95 % (03/26 0457) Weight:  [91.4 kg (201 lb 8 oz)] 91.4 kg (201 lb 8 oz) (03/26 0457) Last BM Date: 07/25/16  Intake/Output from previous day: 03/25 0701 - 03/26 0700 In: 1618.8 [P.O.:960; I.V.:603.8; IV Piggyback:55] Out: 300 [Urine:300] Intake/Output this shift: No intake/output data recorded.  GI: soft, incisions CDI  Lab Results:   Recent Labs  07/29/16 0317 07/30/16 0439  WBC 5.3 7.3  HGB 10.0* 10.2*  HCT 30.8* 31.8*  PLT 108* 122*   BMET  Recent Labs  07/29/16 0317 07/30/16 0439  NA 138 138  K 4.4 3.8  CL 105 105  CO2 21* 20*  GLUCOSE 157* 116*  BUN 16 20  CREATININE 0.86 0.97  CALCIUM 7.5* 7.4*   PT/INR No results for input(s): LABPROT, INR in the last 72 hours. ABG No results for input(s): PHART, HCO3 in the last 72 hours.  Invalid input(s): PCO2, PO2  Studies/Results: Dg Cholangiogram Operative  Result Date: 07/28/2016 CLINICAL DATA:  56 year old female with a history of acute cholecystitis EXAM: INTRAOPERATIVE CHOLANGIOGRAM TECHNIQUE: Cholangiographic images from the C-arm fluoroscopic device were submitted for interpretation post-operatively. Please see the procedural report for the amount of contrast and the fluoroscopy time utilized. COMPARISON:  Ultrasound 07/26/2016 FINDINGS: Surgical instruments project over the upper abdomen. There is cannulation of the cystic duct/gallbladder neck, with antegrade infusion of contrast. Caliber of the extrahepatic ductal system within normal limits. No large filling defect identified. Free flow of contrast across the ampulla. IMPRESSION: Intraoperative cholangiogram  demonstrates extrahepatic biliary ducts of unremarkable caliber, with no large filling defect identified. Free flow of contrast across the ampulla. Please refer to the dictated operative report for full details of intraoperative findings and procedure Electronically Signed   By: Gilmer MorJaime  Wagner D.O.   On: 07/28/2016 13:55    Anti-infectives: Anti-infectives    Start     Dose/Rate Route Frequency Ordered Stop   07/28/16 0930  ceFAZolin (ANCEF) IVPB 2g/100 mL premix     2 g 200 mL/hr over 30 Minutes Intravenous To [facility] 07/28/16 0928 07/28/16 1310   07/28/16 0815  ceFAZolin (ANCEF) powder 2 g  Status:  Discontinued     2 g Other To Surgery 07/28/16 [id number] 07/28/16 0928      Assessment/Plan: s/p Procedure(s): LAPAROSCOPIC CHOLECYSTECTOMY WITH INTRAOPERATIVE CHOLANGIOGRAM (N/A) POD#2 OK for discharge from surgery standpoint. Rx for pain meds and stool softeners placed in chart. Follow up with [facility] in 2-3 weeks.   LOS: 4 days    Berna BueChelsea A Altovise Wahler 07/30/2016

## 2016-07-30 NOTE — Discharge Summary (Signed)
[facility]  Physician Discharge Summary   Patient ID: ANIDA DEOL MRN: [id number] DOB/AGE: Oct 20, 1960 56 y.o.  Admit date: 07/26/2016 Discharge date: 07/30/2016  PCP: Kerby Nora, MD  DISCHARGE DIAGNOSES:  Active Problems:   Diabetes mellitus without complication (HCC)   Essential hypertension, benign   Neuropathic pain of both legs   Pancreatitis   Back pain   RECOMMENDATIONS FOR OUTPATIENT FOLLOW UP: 1. Outpatient follow-up with PCP for blood pressure management 2. LDL noted to be 130. Discussed management with PCP. 3. Follow-up with general surgery.   DISCHARGE CONDITION: fair   Filed Weights   07/28/16 0400 07/29/16 0336 07/30/16 0457  Weight: 84.2 kg (185 lb 10 oz) 88.2 kg (194 lb 8 oz) 91.4 kg (201 lb 8 oz)    INITIAL HISTORY: 56 year old Caucasian female with a past medical history of diabetes on oral agents, hypertension, previous history of pancreatitis presented with abdominal pain, nausea, vomiting. Patient was diagnosed as having acute pancreatitis. She drinks up to 2 drinks about 3-5 times a week. She did have multiple drinks on the weekend before her symptoms started. She was hospitalized for further management. Evaluation revealed gallstones , raising concern for biliary pancreatitis. General surgery was consulted. Patient underwent laparoscopic cholecystectomy.  Consultants: Gen. surgery  Procedures: Laparoscopic cholecystectomy  HOSPITAL COURSE:   Acute Pancreatitis, Likely biliary Ultrasound did show gallstones. Patient mentioned that she has cut down significantly on her alcohol consumption from the past. Lipase levels have improved. Triglyceride is 161. General surgery was consulted due to concern for biliary etiology. Patient underwent laparoscopic cholecystectomy. Intraoperative cholangiogram did not show any concerns. Mildly dilated CBD seen on ultrasound was noted previously as well. Pain is well controlled. Patient has ambulated.  She is tolerating her diet. Okay for discharge today.   Back pain Likely from muscle spasm w/ referred pancreatic pain. Pt w/ longstanding h/o msk pain. Improved with muscle relaxants and K Pad.  Accelerated hypertension  Blood pressure has improved significantly. Elevation in blood pressure, most likely secondary to pain. Patient does have a known history of hypertension and is on lisinopril. She reports compliance with medications. Lisinopril was resumed. Beta blocker was added. Continue both of these medications for now. Patient told to follow-up with her PCP for further blood pressure management. Once her pain has completely resolved, it is quite possible that blood pressure may return back to normal and at that point in time beta blocker could be discontinued.  Diabetes mellitus type 2  Stable. Continue home medications  Chronic pain Continue neurontin  Hyperlipidemia LDL noted to be 130. She will benefit from being on a statin medication, considering history of diabetes. However , there is mention of intolerance to simvastatin. This can be addressed further as outpatient by her PCP.  Overall, stable. Feels much better. Cleared by general surgery. Patient already has recently filled Oxy-acetaminophen at home. So the prescription written for narcotics were shredded. Okay for discharge home today.    PERTINENT LABS:  The results of significant diagnostics from this hospitalization (including imaging, microbiology, ancillary and laboratory) are listed below for reference.     Labs: Basic Metabolic Panel:  Recent Labs Lab 07/26/16 0543 07/27/16 0303 07/28/16 0320 07/29/16 0317 07/30/16 0439  NA 142 142 142 138 138  K 4.6 4.6 4.0 4.4 3.8  CL 99* 102 106 105 105  CO2 26 27 24  21* 20*  GLUCOSE 231* 164* 135* 157* 116*  BUN <5* 8 11 16 20   CREATININE 0.76 0.93 0.75 0.86  0.97  CALCIUM 9.4 8.5* 8.0* 7.5* 7.4*   Liver Function Tests:  Recent Labs Lab 07/26/16 0543  07/27/16 0303 07/28/16 0320 07/29/16 0317 07/30/16 0439  AST 60* 29 21 32 36  ALT 40 27 22 22 24   ALKPHOS 53 46 41 45 55  BILITOT 0.9 0.8 1.3* 0.9 1.1  PROT 7.1 6.3* 6.2* 5.8* 5.3*  ALBUMIN 4.1 3.6 3.1* 2.8* 2.8*    Recent Labs Lab 07/26/16 0543 07/27/16 0856 07/28/16 0320 07/30/16 0942  LIPASE 852* 510* 187* 76*   CBC:  Recent Labs Lab 07/26/16 0543 07/27/16 0303 07/28/16 0320 07/29/16 0317 07/30/16 0439  WBC 5.4 6.3 9.8 5.3 7.3  HGB 13.1 12.4 11.4* 10.0* 10.2*  HCT 39.0 37.2 35.1* 30.8* 31.8*  MCV 97.0 97.4 98.3 99.0 97.2  PLT 137* 125* 123* 108* 122*   CBG:  Recent Labs Lab 07/29/16 0742 07/29/16 1123 07/29/16 1704 07/29/16 2134 07/30/16 0750  GLUCAP 135* 148* 118* 99 91     IMAGING STUDIES Dg Cholangiogram Operative  Result Date: 07/28/2016 CLINICAL DATA:  56 year old female with a history of acute cholecystitis EXAM: INTRAOPERATIVE CHOLANGIOGRAM TECHNIQUE: Cholangiographic images from the C-arm fluoroscopic device were submitted for interpretation post-operatively. Please see the procedural report for the amount of contrast and the fluoroscopy time utilized. COMPARISON:  Ultrasound 07/26/2016 FINDINGS: Surgical instruments project over the upper abdomen. There is cannulation of the cystic duct/gallbladder neck, with antegrade infusion of contrast. Caliber of the extrahepatic ductal system within normal limits. No large filling defect identified. Free flow of contrast across the ampulla. IMPRESSION: Intraoperative cholangiogram demonstrates extrahepatic biliary ducts of unremarkable caliber, with no large filling defect identified. Free flow of contrast across the ampulla. Please refer to the dictated operative report for full details of intraoperative findings and procedure Electronically Signed   By: Gilmer Mor D.O.   On: 07/28/2016 13:55   Ct Abdomen Pelvis W Contrast  Result Date: 07/26/2016 CLINICAL DATA:  Midline abdominal pain, epigastric pain  nausea and vomiting for 2 days EXAM: CT ABDOMEN AND PELVIS WITH CONTRAST TECHNIQUE: Multidetector CT imaging of the abdomen and pelvis was performed using the standard protocol following bolus administration of intravenous contrast. CONTRAST:  100 cc Isovue COMPARISON:  05/01/2013 FINDINGS: Lower chest: Atherosclerotic calcifications of coronary arteries are noted. Heart size within normal limits. Images of the lung bases shows mild dependent atelectasis bilateral lower lobe posteriorly. Hepatobiliary: There is mild fatty infiltration of the liver. No calcified gallstones are noted within gallbladder. Pancreas: There is diffuse mild swelling of the pancreas. Small peripancreatic fluid and stranding noted. Findings are highly suspicious for recurrent acute pancreatitis. No definite evidence of pancreatic necrosis or pancreatic pseudocyst. Spleen: Enhanced spleen without focal abnormality. Adrenals/Urinary Tract: No adrenal gland mass. Enhanced kidneys are symmetrical in size. No hydronephrosis or hydroureter. Delayed renal images shows bilateral renal symmetrical excretion. The urinary bladder is unremarkable. Stomach/Bowel: There is no small bowel obstruction. There is small amount of fluid surrounding the duodenum and between pancreatic head and duodenum. This most likely due to secondary inflammatory changes from acute pancreatitis. No small bowel obstruction. No thickened or dilated small bowel loops. No pericecal inflammation. Normal appendix partially visualized in axial image 81. Some colonic stool noted in descending colon and proximal sigmoid colon. No evidence of distal colitis or diverticulitis. No colonic obstruction. Vascular/Lymphatic: No aortic aneurysm. Mild atherosclerotic calcifications of abdominal aorta and iliac arteries. Axial image 34 there is a portal caval lymph node measures 1.5 cm probable reactive. Reproductive: The uterus is  anteflexed normal size.  No pelvic mass. Other: No evidence of  free abdominal air. Tiny fluid/ stranding in right retroperitoneum and right paracolic gutter probable secondary to acute pancreatitis. Musculoskeletal: No destructive bony lesions are noted. Sagittal images of the spine shows mild degenerative changes lumbar spine. IMPRESSION: 1. There is diffuse mild swelling of the pancreas. Small peripancreatic fluid and stranding noted. Findings are highly suspicious for recurrent acute pancreatitis. No definite evidence of pancreatic necrosis or pancreatic pseudocyst. 2. Mild inflammatory changes/fluid surrounding the duodenum and between duodenum and pancreatic head most likely secondary to pancreatic inflammation rather than duodenitis. Clinical correlation is necessary. 3. No hydronephrosis or hydroureter. 4. Normal appendix.  No pericecal inflammation. 5. Mild degenerative changes thoracic spine. 6. No small bowel obstruction. Electronically Signed   By: Natasha Mead M.D.   On: 07/26/2016 09:27   US Abdomen Limited Ruq  Result Date: 07/26/2016 CLINICAL DATA:  Abdominal pain for 4 days.  Pancreatitis. EXAM: US ABDOMEN LIMITED - RIGHT UPPER QUADRANT COMPARISON:  CT abdomen/ pelvis from earlier today. FINDINGS: Gallbladder: There are a few tiny layering gallstones in the gallbladder measuring up to 6 mm. There is mild layering sludge in the gallbladder. No significant gallbladder distention. No gallbladder wall thickening, pericholecystic fluid or sonographic Murphy's sign. Common bile duct: Diameter: 7 mm. No stones demonstrated in the visualized common bile duct, noting nonvisualization of the distal duct due to overlying bowel gas. Liver: Liver parenchyma is diffusely moderately echogenic with posterior acoustic attenuation, compatible with moderate diffuse hepatic steatosis. No definite liver surface irregularity. No liver mass demonstrated, noting decreased sensitivity in the setting of an echogenic liver. IMPRESSION: 1. Cholelithiasis and gallbladder sludge. No  evidence of acute cholecystitis. 2. Mildly dilated common bile duct (7 mm diameter). No sonographic evidence of choledocholithiasis, with limitations as described. Consider correlation with MRI abdomen with MRCP without and with IV contrast, particularly if the bilirubin levels are elevated. 3. Moderate diffuse hepatic steatosis. Electronically Signed   By: Delbert Phenix M.D.   On: 07/26/2016 12:51    DISCHARGE EXAMINATION: Vitals:   07/29/16 0755 07/29/16 1400 07/29/16 2136 07/30/16 0457  BP:  (!) 163/98 (!) 152/96 (!) 166/101  Pulse: 65 62 72 77  Resp:  20    Temp:  98.8 F (37.1 C) 97.4 F (36.3 C) 98.9 F (37.2 C)  TempSrc:  Oral Oral Oral  SpO2: 95% 97% 97% 95%  Weight:    91.4 kg (201 lb 8 oz)  Height:       General appearance: alert, cooperative, appears stated age and no distress Resp: clear to auscultation bilaterally Cardio: regular rate and rhythm, S1, S2 normal, no murmur, click, rub or gallop GI: soft, only mildly tender in the epigastric area without any rebound, rigidity or guarding. No masses, organomegaly. Bowel sounds are present. Extremities: extremities normal, atraumatic, no cyanosis or edema  DISPOSITION: Home  Discharge Instructions    Call MD for:  extreme fatigue    Complete by:  As directed    Call MD for:  persistant dizziness or light-headedness    Complete by:  As directed    Call MD for:  persistant nausea and vomiting    Complete by:  As directed    Call MD for:  severe uncontrolled pain    Complete by:  As directed    Call MD for:  temperature >100.4    Complete by:  As directed    Diet - low sodium heart healthy    Complete  by:  As directed    Discharge instructions    Complete by:  As directed    Please be sure to follow-up with the primary care provider next week for posthospitalization follow-up, as well as for further management of high blood pressure. Please be sure to follow-up with Gen. surgery. Please avoid consuming alcohol from now  onwards. Your LDL cholesterol level was 161. Please discuss further with your PCP.  You were cared for by a hospitalist during your hospital stay. If you have any questions about your discharge medications or the care you received while you were in the hospital after you are discharged, you can call the unit and asked to speak with the hospitalist on call if the hospitalist that took care of you is not available. Once you are discharged, your primary care physician will handle any further medical issues. Please note that NO REFILLS for any discharge medications will be authorized once you are discharged, as it is imperative that you return to your primary care physician (or establish a relationship with a primary care physician if you do not have one) for your aftercare needs so that they can reassess your need for medications and monitor your lab values. If you do not have a primary care physician, you can call [phone] for a physician referral.   Increase activity slowly    Complete by:  As directed       ALLERGIES:  Allergies  Allergen Reactions  . Simvastatin     Elevated lfts?     Discharge Medication List as of 07/30/2016 11:32 AM    START taking these medications   Details  docusate sodium (COLACE) 100 MG capsule Take 1 capsule (100 mg total) by mouth 2 (two) times daily., Starting Mon 07/30/2016, Until Wed 08/29/2016, Print    methocarbamol (ROBAXIN) 500 MG tablet Take 1 tablet (500 mg total) by mouth every 8 (eight) hours as needed for muscle spasms., Starting Mon 07/30/2016, Print    metoprolol (LOPRESSOR) 50 MG tablet Take 1 tablet (50 mg total) by mouth 2 (two) times daily., Starting Mon 07/30/2016, Print      CONTINUE these medications which have CHANGED   Details  oxyCODONE-acetaminophen (PERCOCET/ROXICET) 5-325 MG tablet Take 1 tablet by mouth every 6 (six) hours as needed for severe pain., Starting Mon 07/30/2016, No Print      CONTINUE these medications which have NOT  CHANGED   Details  gabapentin (NEURONTIN) 300 MG capsule Take 1 capsule (300 mg total) by mouth at bedtime., Starting Mon 11/07/2015, Normal    Lancets (ONETOUCH ULTRASOFT) lancets Use as instructed to check blood sugars twice a day , Historical Med    lisinopril (PRINIVIL,ZESTRIL) 40 MG tablet TAKE 1 TABLET BY MOUTH  DAILY, Normal    metFORMIN (GLUCOPHAGE-XR) 500 MG 24 hr tablet TAKE 2 TABLETS BY MOUTH IN  THE MORNING AND 1 TABLET AT BEDTIME, Normal    ONE TOUCH ULTRA TEST test strip Check blood sugar two times daily as directed, Normal         Follow-up Information    [facility] Follow up in 3 week(s).   Specialty:  General Surgery Contact information: [address] [place] [address] [phone]       Kerby Nora, MD. Schedule an appointment as soon as possible for a visit in 1 week(s).   Specialty:  Family Medicine Contact information: [address] [place] [address] [phone]  TOTAL DISCHARGE TIME: 35 mins  [facility] Regional Medical CenterKRISHNAN,Dartanyan Deasis  [facility] Pager [phone]  07/30/2016, 2:20 PM

## 2016-08-07 ENCOUNTER — Encounter: Payer: Self-pay | Admitting: Family Medicine

## 2016-08-07 ENCOUNTER — Other Ambulatory Visit: Payer: Self-pay | Admitting: Family Medicine

## 2016-08-07 ENCOUNTER — Ambulatory Visit (INDEPENDENT_AMBULATORY_CARE_PROVIDER_SITE_OTHER): Payer: 59 | Admitting: Family Medicine

## 2016-08-07 DIAGNOSIS — G5792 Unspecified mononeuropathy of left lower limb: Secondary | ICD-10-CM

## 2016-08-07 DIAGNOSIS — G5793 Unspecified mononeuropathy of bilateral lower limbs: Secondary | ICD-10-CM

## 2016-08-07 DIAGNOSIS — E119 Type 2 diabetes mellitus without complications: Secondary | ICD-10-CM | POA: Diagnosis not present

## 2016-08-07 DIAGNOSIS — G5791 Unspecified mononeuropathy of right lower limb: Secondary | ICD-10-CM | POA: Diagnosis not present

## 2016-08-07 DIAGNOSIS — K851 Biliary acute pancreatitis without necrosis or infection: Secondary | ICD-10-CM | POA: Diagnosis not present

## 2016-08-07 DIAGNOSIS — Z1231 Encounter for screening mammogram for malignant neoplasm of breast: Secondary | ICD-10-CM

## 2016-08-07 DIAGNOSIS — I10 Essential (primary) hypertension: Secondary | ICD-10-CM | POA: Diagnosis not present

## 2016-08-07 MED ORDER — OXYCODONE-ACETAMINOPHEN 5-325 MG PO TABS: 1.0000 | ORAL_TABLET | Freq: Every evening | ORAL | 0 refills | Status: DC | PRN

## 2016-08-07 NOTE — Progress Notes (Signed)
Subjective:    Patient ID: Mallory Stewart, female    DOB: 1961/01/09, 56 y.o.   MRN: [id number]  HPI   56 year old female presents for hospital follow up. Admitted 3/22 to 3/26 for pancreatitis secondary to gallstones.  Summary duplicated from hospital discharge for non-documentation needs. 56 year old Caucasian female with a past medical history of diabetes on oral agents, hypertension, previous history of pancreatitis presented with abdominal pain, nausea, vomiting. Patient was diagnosed as having acute pancreatitis. She drinks up to 2 drinks about 3-5 times a week. She did have multiple drinks on the weekend before her symptoms started. She was hospitalized for further management. Evaluation revealed gallstones , raising concern for biliary pancreatitis. General surgery was consulted.Patient underwent laparoscopic cholecystectomy RECOMMENDATIONS FOR OUTPATIENT FOLLOW UP: 1. Outpatient follow-up with PCP for blood pressure management 2. LDL noted to be 130. Discussed management with PCP. 3. Follow-up with general surgery.   During hospitalization her BP was found to be elevated.. Improved with improvement in pain.  Lisinopril was resumed and BBlocker was added for control. Also hospitalist recommended statin given LDL 130 in setting of Dm. Pt intolerant of statin in past.  Today 08/07/16    BP has improved but she is feeling foggy on the  b blocker.   At home 135/72. BP Readings from Last 3 Encounters:  08/07/16 138/74  07/30/16 (!) 166/101  03/02/16 (!) 140/94      She is on her  Usual pain medication 1-2 tabs per day for neuropathy. She has been able to decrease down from 3 tabs a day.  Used  1 tab in last 24 hours.  Pain med rx shredded on 3/26 per hospitalist note.   Her pain is well controlled. Less back spasms.Gallbladder removed she has had severe uncontrollable diarrhea off and on.  Has to rush to bathroom.  Mulitple per day and some in middle of night. She is  no longer on colace.   Her sugar has been well controlled in hospital and at home.       BP Readings from Last 3 Encounters:  08/07/16 138/74  07/30/16 (!) 166/101  03/02/16 (!) 140/94      Review of Systems  Constitutional: Negative for fatigue and fever.  HENT: Negative for ear pain.   Eyes: Negative for pain.  Respiratory: Negative for chest tightness and shortness of breath.   Cardiovascular: Negative for chest pain, palpitations and leg swelling.  Gastrointestinal: Negative for abdominal pain.  Genitourinary: Negative for dysuria.       Objective:   Physical Exam  Constitutional: Vital signs are normal. She appears well-developed and well-nourished. She is cooperative.  Non-toxic appearance. She does not appear ill. No distress.  HENT:  Head: Normocephalic.  Right Ear: Hearing, tympanic membrane, external ear and ear canal normal. Tympanic membrane is not erythematous, not retracted and not bulging.  Left Ear: Hearing, tympanic membrane, external ear and ear canal normal. Tympanic membrane is not erythematous, not retracted and not bulging.  Nose: No mucosal edema or rhinorrhea. Right sinus exhibits no maxillary sinus tenderness and no frontal sinus tenderness. Left sinus exhibits no maxillary sinus tenderness and no frontal sinus tenderness.  Mouth/Throat: Uvula is midline, oropharynx is clear and moist and mucous membranes are normal.  Eyes: Conjunctivae, EOM and lids are normal. Pupils are equal, round, and reactive to light. Lids are everted and swept, no foreign bodies found.  Neck: Trachea normal and normal range of motion. Neck supple. Carotid bruit is not  present. No thyroid mass and no thyromegaly present.  Cardiovascular: Normal rate, regular rhythm, S1 normal, S2 normal, normal heart sounds, intact distal pulses and normal pulses.  Exam reveals no gallop and no friction rub.   No murmur heard. Pulmonary/Chest: Effort normal and breath sounds normal. No  tachypnea. No respiratory distress. She has no decreased breath sounds. She has no wheezes. She has no rhonchi. She has no rales.  Abdominal: Soft. Normal appearance and bowel sounds are normal. There is no hepatosplenomegaly. There is no tenderness. There is no rigidity, no rebound, no guarding and no CVA tenderness.  bruising and well healed surgical sites.  Neurological: She is alert.  Skin: Skin is warm, dry and intact. No rash noted.  Psychiatric: Her speech is normal and behavior is normal. Judgment and thought content normal. Her mood appears not anxious. Cognition and memory are normal. She does not exhibit a depressed mood.          Assessment & Plan:

## 2016-08-07 NOTE — Patient Instructions (Addendum)
Continue metoprolol for now. If blood pressure decreased < 90/60 or lightheaded .. Call.  If side effects not improved in 2 weeks.. Call to change BP med.  Call if diarrhea not improving with time.

## 2016-08-07 NOTE — Assessment & Plan Note (Signed)
Stable control in hospital and post op on current metformin.

## 2016-08-07 NOTE — Progress Notes (Signed)
Pre visit review using our clinic review tool, if applicable. No additional management support is needed unless otherwise documented below in the visit note. 

## 2016-08-07 NOTE — Assessment & Plan Note (Addendum)
Pain now resolved S/P cholecystectomy. Now with post op diarrhea for removal of gallbladder. Likely will improve with time. Pt shows no sign of infectious diarrhea. Pt denied med to treat. Can consider cahnge of metformin if this may be contributing.

## 2016-08-07 NOTE — Assessment & Plan Note (Signed)
Pain is better tolerated, now able to reduce to 1-2 tabs at night. Refilled. Continue gabapentin.

## 2016-08-07 NOTE — Assessment & Plan Note (Signed)
Improved control with addition of BBLocker. Recommended giving the med more time for her to get used to it.. SE may improve.

## 2016-08-14 ENCOUNTER — Other Ambulatory Visit: Payer: Self-pay | Admitting: Family Medicine

## 2016-08-14 ENCOUNTER — Other Ambulatory Visit: Payer: Self-pay

## 2016-08-14 MED ORDER — METOPROLOL TARTRATE 50 MG PO TABS: 50.0000 mg | ORAL_TABLET | Freq: Two times a day (BID) | ORAL | 0 refills | Status: DC

## 2016-08-14 NOTE — Telephone Encounter (Addendum)
What pharmacy does she want  Metoprolol sent to? [facility]?

## 2016-08-14 NOTE — Telephone Encounter (Signed)
Pt left v/m;pt will be out of town for business and wants metoprolol sent to [facility] [place] [facility]. 08/07/16 f/u visit was noted to continue metoprolol for now and if SE not improve in 2 weeks will change BP med.Please advise.

## 2016-08-15 ENCOUNTER — Telehealth: Payer: Self-pay | Admitting: Family Medicine

## 2016-08-15 MED ORDER — METOPROLOL TARTRATE 50 MG PO TABS: 50.0000 mg | ORAL_TABLET | Freq: Two times a day (BID) | ORAL | 0 refills | Status: DC

## 2016-08-15 NOTE — Telephone Encounter (Signed)
I spoke with pt and the pain she is having is identical to pain prior to surgery. Pt will call surgeon's office and if needed pt will call our office back. FYI to Dr Ermalene Searing.

## 2016-08-15 NOTE — Addendum Note (Signed)
Addended by: Damita Lack on: 08/15/2016 08:10 AM   Modules accepted: Orders

## 2016-08-15 NOTE — Telephone Encounter (Signed)
First Rx was set to print.  Resent electronically.

## 2016-08-15 NOTE — Telephone Encounter (Signed)
Patient Name: Mallory Stewart DOB: 12-22-1960 Initial Comment Caller recently had gallbladder removed, is now having pain in her pancreas area like when she's had pancreatitis in the past. Hurts to breathe in occasionally due to it. Nurse Assessment Nurse: Charna Elizabeth, RN, Cathy Date/Time (Eastern Time): 08/15/2016 9:47:34 AM Confirm and document reason for call. If symptomatic, describe symptoms. ---Mallory Stewart states she had her gallbladder removed 2.5 weeks ago. She developed upper, centralized abdominal pain again last night that she rates as a 5-8 on the 1 to 10 scale. No fever. No severe breathing difficulty, but taking a deep breath in causes increased pain. Alert and responsive. Does the patient have any new or worsening symptoms? ---Yes Will a triage be completed? ---Yes Related visit to physician within the last 2 weeks? ---Yes Does the PT have any chronic conditions? (i.e. diabetes, asthma, etc.) ---Yes List chronic conditions. ---Gallbladder removed 2.5 weeks ago, Diabetes, High Blood Pressure Is the patient pregnant or possibly pregnant? (Ask all females between the ages of 30-55) ---No Is this a behavioral health or substance abuse call? ---No Guidelines Guideline Title Affirmed Question Affirmed Notes Post-Op Symptoms and Questions [1] SEVERE post-op pain (e.g., excruciating, pain scale 8-10) AND [2] not controlled with pain medications Final Disposition User Call PCP Now Charna Elizabeth, RN, Cathy Comments Called the office backline and notified Mallory Stewart. Connected Mallory Stewart and Mallory Stewart together.  Mallory Stewart will follow up with her surgeon, also. Disagree/Comply: Comply

## 2016-08-17 MED ORDER — METOPROLOL TARTRATE 50 MG PO TABS: 50.0000 mg | ORAL_TABLET | Freq: Two times a day (BID) | ORAL | 0 refills | Status: DC

## 2016-08-17 MED ORDER — METOPROLOL TARTRATE 50 MG PO TABS: 50.0000 mg | ORAL_TABLET | Freq: Two times a day (BID) | ORAL | 3 refills | Status: DC

## 2016-08-17 NOTE — Telephone Encounter (Signed)
[facility] [place] and [facility].

## 2016-08-17 NOTE — Telephone Encounter (Signed)
See below

## 2016-08-17 NOTE — Telephone Encounter (Signed)
Left message for [name] to call us back and let us know what pharmacy to send the metoprolol prescription to.

## 2016-08-23 ENCOUNTER — Ambulatory Visit: Payer: 59

## 2016-08-24 ENCOUNTER — Ambulatory Visit: Payer: 59

## 2016-09-07 ENCOUNTER — Telehealth: Payer: Self-pay | Admitting: Family Medicine

## 2016-09-07 DIAGNOSIS — E119 Type 2 diabetes mellitus without complications: Secondary | ICD-10-CM

## 2016-09-07 DIAGNOSIS — E7800 Pure hypercholesterolemia, unspecified: Secondary | ICD-10-CM

## 2016-09-07 DIAGNOSIS — E538 Deficiency of other specified B group vitamins: Secondary | ICD-10-CM

## 2016-09-07 NOTE — Telephone Encounter (Signed)
-----   Message from Alvina Chouerri J Walsh sent at 08/29/2016 11:52 AM EDT ----- Regarding: Lab orders for Tuesday, 5.8.18 Patient is scheduled for CPX labs, please order future labs, Thanks , Camelia Engerri

## 2016-09-10 ENCOUNTER — Other Ambulatory Visit: Payer: 59

## 2016-09-11 ENCOUNTER — Other Ambulatory Visit: Payer: 59

## 2016-09-14 ENCOUNTER — Encounter: Payer: 59 | Admitting: Family Medicine

## 2016-09-17 ENCOUNTER — Other Ambulatory Visit: Payer: Self-pay

## 2016-09-17 ENCOUNTER — Ambulatory Visit (INDEPENDENT_AMBULATORY_CARE_PROVIDER_SITE_OTHER): Payer: 59

## 2016-09-17 ENCOUNTER — Encounter: Payer: Self-pay | Admitting: Sports Medicine

## 2016-09-17 ENCOUNTER — Ambulatory Visit (INDEPENDENT_AMBULATORY_CARE_PROVIDER_SITE_OTHER): Payer: 59 | Admitting: Sports Medicine

## 2016-09-17 VITALS — BP 150/92 | HR 76 | Ht 68.5 in | Wt 173.0 lb

## 2016-09-17 DIAGNOSIS — G5791 Unspecified mononeuropathy of right lower limb: Secondary | ICD-10-CM | POA: Diagnosis not present

## 2016-09-17 DIAGNOSIS — M25511 Pain in right shoulder: Secondary | ICD-10-CM

## 2016-09-17 DIAGNOSIS — M4722 Other spondylosis with radiculopathy, cervical region: Secondary | ICD-10-CM | POA: Diagnosis not present

## 2016-09-17 DIAGNOSIS — F1120 Opioid dependence, uncomplicated: Secondary | ICD-10-CM

## 2016-09-17 DIAGNOSIS — G5793 Unspecified mononeuropathy of bilateral lower limbs: Secondary | ICD-10-CM

## 2016-09-17 DIAGNOSIS — G5792 Unspecified mononeuropathy of left lower limb: Secondary | ICD-10-CM | POA: Diagnosis not present

## 2016-09-17 MED ORDER — GABAPENTIN 300 MG PO CAPS: ORAL_CAPSULE | ORAL | 1 refills | Status: DC

## 2016-09-17 MED ORDER — METHYLPREDNISOLONE 4 MG PO TBPK: ORAL_TABLET | ORAL | 0 refills | Status: DC

## 2016-09-17 MED ORDER — OXYCODONE-ACETAMINOPHEN 5-325 MG PO TABS: 1.0000 | ORAL_TABLET | Freq: Every evening | ORAL | 0 refills | Status: DC | PRN

## 2016-09-17 NOTE — Telephone Encounter (Signed)
Pt left v/m requesting rx oxycodone apap; call when ready for pick up.Pt last seen and rx last printed # 60 on 08/07/16.

## 2016-09-17 NOTE — Assessment & Plan Note (Signed)
[facility] evidence of overt peripheral [facility].  She has lower extremity symptoms as well as I believe new onset upper extremity symptoms.  Further evaluation with advanced lumbar and cervical spine imaging could be considered.  We did discuss appropriate titration of gabapentin which will hopefully be helpful for the persistent lower extremity symptoms that she is having as well.

## 2016-09-17 NOTE — Assessment & Plan Note (Signed)
Given the neural tension signs on exam we will go ahead and start empiric treatment for a neurogenic etiology of her pain. Gabapentin titration discussed.  Can consider transition to Lyrica but she would like to defer this. Medrol Dosepak provided given the severity of the pain. Follow-up in 4 weeks for addition of cervical spine as well as shoulder therapeutic exercises.  If any worsening symptoms in the interim I am happy to see her back sooner in we will need to plan on 2 view x-ray of the cervical spine at follow-up with consideration of MSK ultrasound of the shoulder if more focal shoulder symptoms.

## 2016-09-17 NOTE — Progress Notes (Signed)
OFFICE VISIT NOTE Veverly FellsMichael D. Delorise Shinerigby, DO  [facility] [phone]  Odelia GageKathleen L Schendel - 56 y.o. female MRN [id number]  Date of birth: 09/11/1960  Visit Date: 09/17/2016  PCP: Excell SeltzerBedsole, Amy E, MD   Referred by: Excell SeltzerBedsole, Amy E, MD  Orlie DakinBrandy Shelton, CMA acting as scribe for Dr. Berline Choughigby.  SUBJECTIVE:   Chief Complaint  Patient presents with  . pain in right shoulder   HPI: As below and per problem based documentation when appropriate.  Right shoulder pain, mostly posterior Pain started a couple of weeks ago.  She noticed pain while traveling and carrying her carry on bag and suit case. Her suit case got caught while going through the doors. She grabbed the bag and felt something pop in her shoulder. The pain has gotten worse over time.   The pain is described as constant aching, occasional spasms, pain is sharp and is rated as 8/10.  Worsened with ROM, turning head Nothing pt has tried alleviates or helps with the pain.  Therapies tried include She has tried percocet that she has from a non related issue and gets minimal relief with that. She has tried ice and heat with no relief.   Other associated symptoms include: tingling in right arm and numbness in right hand for a couple of days. She has trouble sleeping d/t the pain.   Pt has not had any recent imaging on the shoulder.     ROS  Otherwise per HPI.  HISTORY & PERTINENT PRIOR DATA:  No specialty comments available. She reports that she has never smoked. She has never used smokeless tobacco.   Recent Labs  09/20/15 0825 02/27/16 0843  HGBA1C 5.9 6.0   Medications & Allergies reviewed per EMR Patient Active Problem List   Diagnosis Date Noted  . Right shoulder pain 09/17/2016  . Osteoarthritis of spine with radiculopathy, cervical region 09/17/2016  . Narcotic dependency, continuous (HCC) 09/17/2016  . Pancreatitis 07/26/2016  . Back pain 07/26/2016  . Hyperkalemia  03/02/2016  . Post concussive syndrome 11/23/2015  . B12 deficiency 10/21/2014  . Neuropathic pain of both legs 02/11/2014  . Pancreatic pseudocyst/cyst 05/06/2013  . Fatty liver 04/27/2013  . Elevated liver enzymes 02/05/2011  . EXCESSIVE OR FREQUENT MENSTRUATION 05/10/2009  . Diabetes mellitus without complication (HCC) 11/03/2008  . HYPERCHOLESTEROLEMIA 11/03/2008  . Essential hypertension, benign 09/29/2008  . PAP SMEAR, ABNORMAL 09/29/2008   Past Medical History:  Diagnosis Date  . Diabetes (HCC)   . Hypertension   . Pancreatitis    Family History  Problem Relation Age of Onset  . Other Mother        tachycardia.Marland Kitchen.Marland Kitchen.?afib  . Stroke Father        after hernia suegery  . Prostate cancer Father   . Other Father        global transient amnesia, unclear source  . Atrial fibrillation Sister   . Healthy Brother   . Healthy Brother   . Coronary artery disease Paternal Grandmother   . Heart attack Paternal Grandmother [id number]  . Brain cancer Maternal Grandfather        ?  Marland Kitchen. Cancer Paternal Grandfather        ?  Marland Kitchen. Breast cancer Maternal Grandmother    Past Surgical History:  Procedure Laterality Date  . CHOLECYSTECTOMY N/A 07/28/2016   Procedure: LAPAROSCOPIC CHOLECYSTECTOMY WITH INTRAOPERATIVE CHOLANGIOGRAM;  Surgeon: Rodman PickleLuke Aaron Kinsinger, MD;  [facility];  Service: General;  Laterality: N/A;  .  LEEP  1990's  . ORIF FOOT FRACTURE  09/2008   L 5th metatarsal  . REFRACTIVE SURGERY  2000  . TONSILLECTOMY     Social History   Occupational History  . Director of business development    Social History Main Topics  . Smoking status: Never Smoker  . Smokeless tobacco: Never Used  . Alcohol use 4.2 oz/week    7 Glasses of wine per week     Comment: wine  . Drug use: No  . Sexual activity: Not on file    OBJECTIVE:  VS:  HT:5' 8.5" (174 cm)   WT:173 lb (78.5 kg)  BMI:26    BP:(!) 150/92  HR:76bpm  TEMP: ( )  RESP:98 % EXAM: Findings:  WDWN, NAD, Non-toxic  appearing Alert & appropriately interactive Not depressed or anxious appearing No increased work of breathing. Pupils are equal. EOM intact without nystagmus No clubbing or cyanosis of the extremities appreciated No significant rashes/lesions/ulcerations overlying the examined area. Radial pulses 2+/4.  No significant generalized UE edema. Sensation intact to light touch in upper extremities with slight dysesthesia in the entire right arm in a nondermatomal distribution.  Right Shoulder:  Normal alignment. No significant deformity.  Generalized TTP without focal bony tenderness. Pain but intact strength with empty can testing, speeds and O'Brien's. 10-15 of limited flexion and abduction compared to the left. Marked pain with brachial plexus squeeze and arm squeeze test on the right, minimal on the left. Pain with ipsilateral right-sided Spurling's test and positive Lhermitte's compression test. Negative Hoffmann sign   Right shoulder x-rays obtained: Initial read negative for acute fracture.  She does have a small amount of osteophytic spurring of the inferior glenoid with overall well-maintained humeral head..  Subacromial space is essentially normal.  Type I acromion.  Moderate degenerative changes of the AC joint.     No results found. ASSESSMENT & PLAN:   Problem List Items Addressed This Visit    Neuropathic pain of both legs    Previous extensive evaluation at [facility] neurology without evidence of overt peripheral neuropathy.  She has lower extremity symptoms as well as I believe new onset upper extremity symptoms.  Further evaluation with advanced lumbar and cervical spine imaging could be considered.  We did discuss appropriate titration of gabapentin which will hopefully be helpful for the persistent lower extremity symptoms that she is having as well.      Right shoulder pain - Primary   Relevant Medications   methylPREDNISolone (MEDROL DOSEPAK) 4 MG TBPK tablet   gabapentin  (NEURONTIN) 300 MG capsule   Other Relevant Orders   DG Shoulder Right   Osteoarthritis of spine with radiculopathy, cervical region    Given the neural tension signs on exam we will go ahead and start empiric treatment for a neurogenic etiology of her pain. Gabapentin titration discussed.  Can consider transition to Lyrica but she would like to defer this. Medrol Dosepak provided given the severity of the pain. Follow-up in 4 weeks for addition of cervical spine as well as shoulder therapeutic exercises.  If any worsening symptoms in the interim I am happy to see her back sooner in we will need to plan on 2 view x-ray of the cervical spine at follow-up with consideration of MSK ultrasound of the shoulder if more focal shoulder symptoms.      Relevant Medications   methylPREDNISolone (MEDROL DOSEPAK) 4 MG TBPK tablet   gabapentin (NEURONTIN) 300 MG capsule      Follow-up: Return  in about 4 weeks (around 10/15/2016).   CMA/ATC served as Neurosurgeon during this visit. History, Physical, and Plan performed by medical provider. Documentation and orders reviewed and attested to.      Gaspar Bidding, DO    [facility] Physician    09/17/2016 1:30 PM

## 2016-09-17 NOTE — Telephone Encounter (Signed)
Left message for [name] that her prescription is ready to be picked up at the front desk. 

## 2016-09-17 NOTE — Telephone Encounter (Signed)
  09/20/15 controlled substance contract signed, uds sample given, low risk, next screen 09/19/16. She needs to do UDS when picks up this Rx.    For some reason unable to find on [employer] database. Will contact [employer] to find out what error is.

## 2016-09-18 ENCOUNTER — Encounter: Payer: Self-pay | Admitting: Family Medicine

## 2016-10-03 ENCOUNTER — Other Ambulatory Visit: Payer: 59

## 2016-10-05 ENCOUNTER — Encounter: Payer: 59 | Admitting: Family Medicine

## 2016-10-09 ENCOUNTER — Other Ambulatory Visit: Payer: Self-pay

## 2016-10-09 ENCOUNTER — Other Ambulatory Visit: Payer: 59

## 2016-10-09 DIAGNOSIS — M25511 Pain in right shoulder: Secondary | ICD-10-CM

## 2016-10-09 MED ORDER — GABAPENTIN 300 MG PO CAPS: ORAL_CAPSULE | ORAL | 1 refills | Status: DC

## 2016-10-14 ENCOUNTER — Emergency Department (HOSPITAL_COMMUNITY)
Admission: EM | Admit: 2016-10-14 | Discharge: 2016-10-14 | Disposition: A | Discharge status: Home / Self Care | Payer: 59 | Attending: Emergency Medicine | Admitting: Emergency Medicine

## 2016-10-14 ENCOUNTER — Encounter (HOSPITAL_COMMUNITY): Payer: Self-pay | Admitting: Emergency Medicine

## 2016-10-14 DIAGNOSIS — M79671 Pain in right foot: Secondary | ICD-10-CM | POA: Diagnosis present

## 2016-10-14 DIAGNOSIS — L03115 Cellulitis of right lower limb: Secondary | ICD-10-CM

## 2016-10-14 DIAGNOSIS — E119 Type 2 diabetes mellitus without complications: Secondary | ICD-10-CM | POA: Diagnosis not present

## 2016-10-14 DIAGNOSIS — Z7984 Long term (current) use of oral hypoglycemic drugs: Secondary | ICD-10-CM | POA: Insufficient documentation

## 2016-10-14 DIAGNOSIS — I10 Essential (primary) hypertension: Secondary | ICD-10-CM | POA: Diagnosis not present

## 2016-10-14 DIAGNOSIS — W57XXXA Bitten or stung by nonvenomous insect and other nonvenomous arthropods, initial encounter: Secondary | ICD-10-CM | POA: Diagnosis not present

## 2016-10-14 MED ORDER — DOXYCYCLINE HYCLATE 100 MG PO CAPS: 100.0000 mg | ORAL_CAPSULE | Freq: Two times a day (BID) | ORAL | 0 refills | Status: DC

## 2016-10-14 NOTE — ED Provider Notes (Signed)
[facility] Provider Note   CSN: [id number] Arrival date & time: 10/14/16  1209     History   Chief Complaint Chief Complaint  Patient presents with  . Foot Pain  . Insect Bite    HPI Mallory Stewart is a 56 y.o. female.  HPI   Pt with hx DM, HTN, chronic lower extremity neuropathy and decreased sensation p/w tick bite on right foot.  States she found a tick between her toes this morning.  States it was not engorged, it was "flat and dead."  She has no idea how long the tick was attached.  She does note that she was sore all over this morning and tired, her right foot felt more numb than usual.  States this may also be because she is currently in the process of moving, had a yard sale yesterday, has been very active and busy.  Denies fevers, vomiting, any pain associated with the area (but at baseline as no feeling in her feet).    Past Medical History:  Diagnosis Date  . Diabetes (HCC)   . Hypertension   . Pancreatitis     Patient Active Problem List   Diagnosis Date Noted  . Right shoulder pain 09/17/2016  . Osteoarthritis of spine with radiculopathy, cervical region 09/17/2016  . Narcotic dependency, continuous (HCC) 09/17/2016  . Pancreatitis 07/26/2016  . Back pain 07/26/2016  . Hyperkalemia 03/02/2016  . Post concussive syndrome 11/23/2015  . B12 deficiency 10/21/2014  . Neuropathic pain of both legs 02/11/2014  . Pancreatic pseudocyst/cyst 05/06/2013  . Fatty liver 04/27/2013  . Elevated liver enzymes 02/05/2011  . EXCESSIVE OR FREQUENT MENSTRUATION 05/10/2009  . Diabetes mellitus without complication (HCC) 11/03/2008  . HYPERCHOLESTEROLEMIA 11/03/2008  . Essential hypertension, benign 09/29/2008  . PAP SMEAR, ABNORMAL 09/29/2008    Past Surgical History:  Procedure Laterality Date  . CHOLECYSTECTOMY N/A 07/28/2016   Procedure: LAPAROSCOPIC CHOLECYSTECTOMY WITH INTRAOPERATIVE CHOLANGIOGRAM;  Surgeon: Rodman PickleLuke Aaron Kinsinger, MD;  [facility];   Service: General;  Laterality: N/A;  . LEEP  1990's  . ORIF FOOT FRACTURE  09/2008   L 5th metatarsal  . REFRACTIVE SURGERY  2000  . TONSILLECTOMY      OB History    No data available       Home Medications    Prior to Admission medications   Medication Sig Start Date End Date Taking? Authorizing Provider  gabapentin (NEURONTIN) 300 MG capsule Take one tablet twice per day (morning and bedtime) x7 days. Continue to take one tablet every morning, start 1 tablet daily at 2pm, and increase to 2 tablets at bedtime. Patient taking differently: Take 300-600 mg by mouth 2 (two) times daily. Take one capsule in the morning, and 2 capsule at bedtime 10/09/16  Yes Andrena Mewsigby, Michael D, DO  lisinopril (PRINIVIL,ZESTRIL) 40 MG tablet TAKE 1 TABLET BY MOUTH  DAILY 07/04/16  Yes Bedsole, Amy E, MD  Melatonin 10 MG CAPS Take 10 mg by mouth at bedtime.   Yes [provider]  metFORMIN (GLUCOPHAGE-XR) 500 MG 24 hr tablet TAKE 2 TABLETS BY MOUTH IN  THE MORNING AND 1 TABLET AT BEDTIME 07/04/16  Yes Bedsole, Amy E, MD  metoprolol (LOPRESSOR) 50 MG tablet Take 1 tablet (50 mg total) by mouth 2 (two) times daily. 08/17/16  Yes Bedsole, Amy E, MD  oxyCODONE-acetaminophen (PERCOCET/ROXICET) 5-325 MG tablet Take 1-2 tablets by mouth at bedtime as needed for severe pain. 09/17/16  Yes Bedsole, Amy E, MD  doxycycline (VIBRAMYCIN)  100 MG capsule Take 1 capsule (100 mg total) by mouth 2 (two) times daily. One po bid x 7 days 10/14/16   Korie Brabson, PA-C  methocarbamol (ROBAXIN) 500 MG tablet Take 1 tablet (500 mg total) by mouth every 8 (eight) hours as needed for muscle spasms. Patient not taking: Reported on 10/14/2016 07/30/16   Osvaldo Shipper, MD    Family History Family History  Problem Relation Age of Onset  . Other Mother        tachycardia.Marland KitchenMarland Kitchen?afib  . Stroke Father        after hernia suegery  . Prostate cancer Father   . Other Father        global transient amnesia, unclear source  . Atrial  fibrillation Sister   . Healthy Brother   . Healthy Brother   . Coronary artery disease Paternal Grandmother   . Heart attack Paternal Grandmother 55  . Brain cancer Maternal Grandfather        ?  Marland Kitchen Cancer Paternal Grandfather        ?  Marland Kitchen Breast cancer Maternal Grandmother     Social History Social History  Substance Use Topics  . Smoking status: Never Smoker  . Smokeless tobacco: Never Used  . Alcohol use 4.2 oz/week    7 Glasses of wine per week     Comment: wine     Allergies   Simvastatin   Review of Systems Review of Systems  All other systems reviewed and are negative.    Physical Exam Updated Vital Signs BP (!) 166/100   Pulse 76   Temp 98 F (36.7 C) (Oral)   Resp 16   SpO2 100%   Physical Exam  Constitutional: She appears well-developed and well-nourished. No distress.  HENT:  Head: Normocephalic and atraumatic.  Neck: Neck supple.  Cardiovascular: Normal rate and regular rhythm.   Pulmonary/Chest: Effort normal and breath sounds normal. No respiratory distress. She has no wheezes. She has no rales.  Musculoskeletal:  Right foot with small area of erythema between 3rd and 4th toe, slightly into forefoot.  No induration or fluctuance.  Pt has symmetric decreased sensation distal to the knees.  Lower extremity distal pulses intact.    Neurological: She is alert.  Skin: She is not diaphoretic.  Nursing note and vitals reviewed.    ED Treatments / Results  Labs (all labs ordered are listed, but only abnormal results are displayed) Labs Reviewed - No data to display  EKG  EKG Interpretation None       Radiology No results found.  Procedures Procedures (including critical care time)  Medications Ordered in ED Medications - No data to display   Initial Impression / Assessment and Plan / ED Course  I have reviewed the triage vital signs and the nursing notes.  Pertinent labs & imaging results that were available during my care of the  patient were reviewed by me and considered in my medical decision making (see chart for details).    Afebrile, nontoxic patient with tick found between her toes and localized erythema.  Unsure of duration of attachment.  She has felt tired and achy today but more likely related to packing, yard sale, moving over past few days.  No e/o abscess.  Doubt rocky mountain spotted fever.  Suspect mild localized cellulitis.  D/C home with doxycycline, PCP follow up.   Discussed result, findings, treatment, and follow up  with patient.  Pt given return precautions.  Pt verbalizes understanding and agrees  with plan.       Final Clinical Impressions(s) / ED Diagnoses   Final diagnoses:  Tick bite, initial encounter  Cellulitis of right lower extremity    New Prescriptions New Prescriptions   DOXYCYCLINE (VIBRAMYCIN) 100 MG CAPSULE    Take 1 capsule (100 mg total) by mouth 2 (two) times daily. One po bid x 7 days     Trixie Dredge, [place] 10/14/16 1408    Vanetta Mulders, MD 10/15/16 [phone]

## 2016-10-14 NOTE — Discharge Instructions (Signed)
Read the information below.  Use the prescribed medication as directed.  Please discuss all new medications with your pharmacist.  You may return to the Emergency Department at any time for worsening condition or any new symptoms that concern you.   If you develop increased redness, swelling, pus draining from the wound, or fevers greater than 100.4, return to the ER immediately for a recheck.   °

## 2016-10-14 NOTE — ED Triage Notes (Signed)
Pt here with rash to right foot after finding tick in same area; pt sts some pain but has neuropathy

## 2016-10-14 NOTE — ED Notes (Signed)
Spoke with PA regarding BP. Instructed patient to follow up with Primary Doctor and continue to take BP regular basis. Patient states stressed about moving today.

## 2016-10-15 ENCOUNTER — Ambulatory Visit: Payer: 59 | Admitting: Sports Medicine

## 2016-10-16 ENCOUNTER — Encounter: Payer: 59 | Admitting: Family Medicine

## 2016-10-16 ENCOUNTER — Other Ambulatory Visit: Payer: 59

## 2016-10-19 ENCOUNTER — Encounter: Payer: 59 | Admitting: Family Medicine

## 2016-11-01 ENCOUNTER — Other Ambulatory Visit: Payer: Self-pay

## 2016-11-01 MED ORDER — OXYCODONE-ACETAMINOPHEN 5-325 MG PO TABS: 1.0000 | ORAL_TABLET | Freq: Every evening | ORAL | 0 refills | Status: DC | PRN

## 2016-11-01 NOTE — Telephone Encounter (Signed)
Pain Contract signed [place]   Last UDS 09/2016 NCCSR reviewed on 11/01/2016.. Found under Terlecki, [name] ( not Dejah) DX: chronic neuropathic pain in bilateral legs Last OV pain meds discussed: 08/2016  

## 2016-11-01 NOTE — Telephone Encounter (Signed)
Left voicemail letting pt know Rx ready for pick up 

## 2016-11-01 NOTE — Telephone Encounter (Signed)
Pt left v/m requesting rx for oxycodone apap. Call when ready for pick up. Pt would like to pick up ASAP. Last printed # 60 on 09/17/16. Pt last seen 08/07/16.

## 2016-11-10 IMAGING — CT CT HEAD W/O CM
4 series · 19 of 47 positions shown, 21 images · non-contrast
Comparison: MRI head on 04/25/2014.

CLINICAL DATA: Patient status post fall. Trauma to the posterior
and left side of the head. Brief loss of consciousness. Initial
encounter.

EXAM:
CT HEAD WITHOUT CONTRAST
TECHNIQUE: Contiguous axial images were obtained from the base of the skull
through the vertex without intravenous contrast.

[Series 201: head w/o, idose (1) · axial · non-contrast · 0.39mm/px · z∈[+55,+170]mm · 8 of 31 slices shown, 10 images]
[im 4/31  brain]
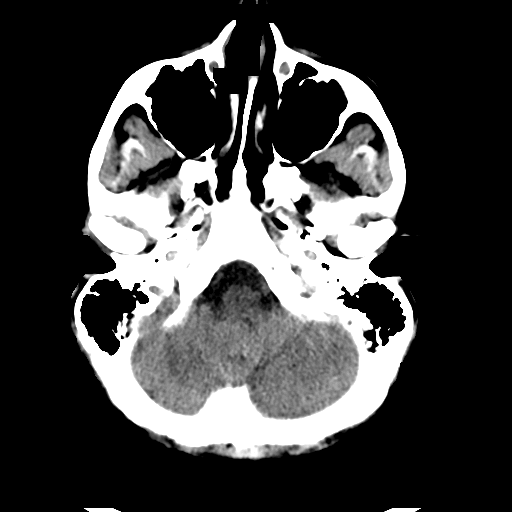
[im 4/31  bone]
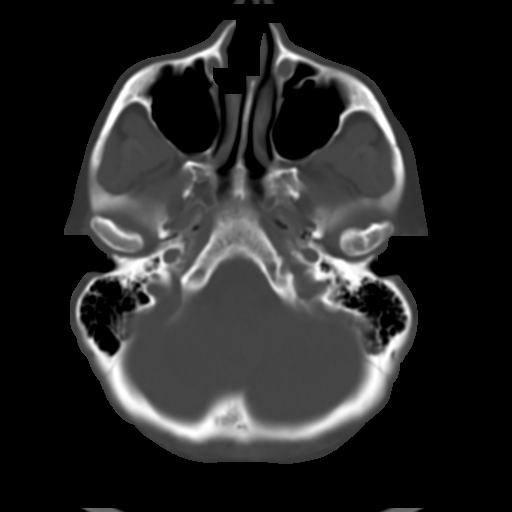
[im 7/31  brain]
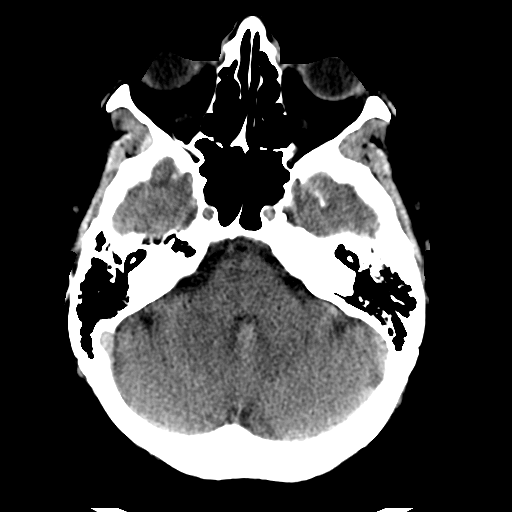
[im 11/31  brain]
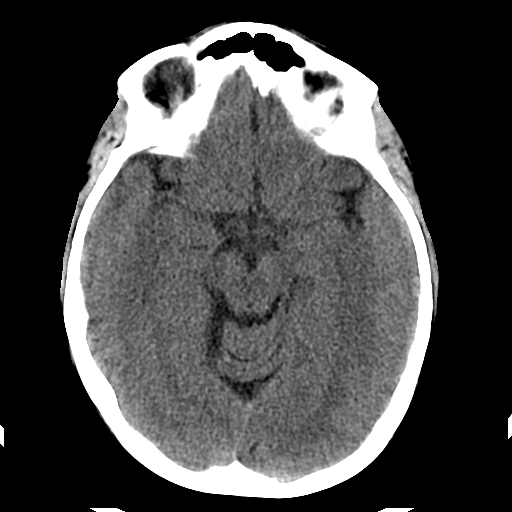
[im 14/31  brain]
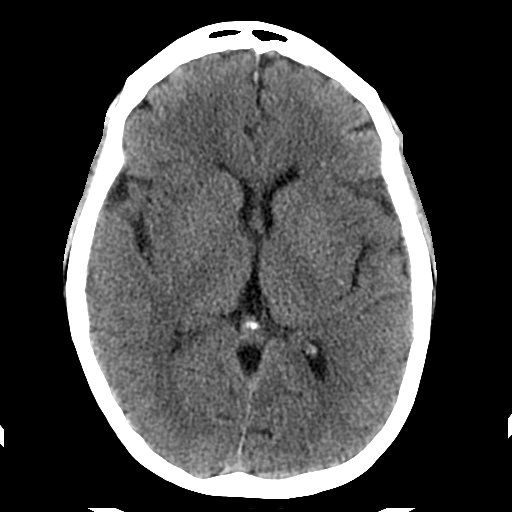
[im 17/31  brain]
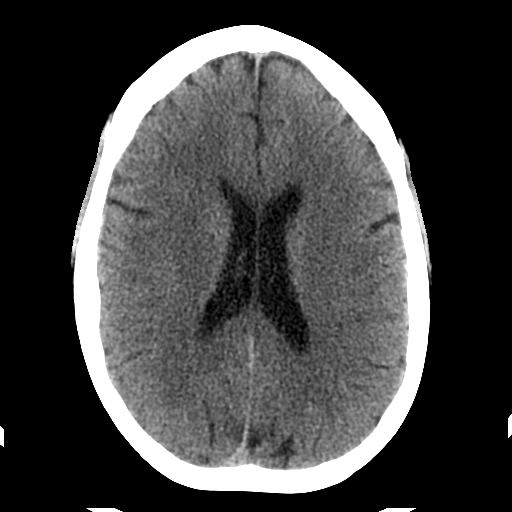
[im 17/31  bone]
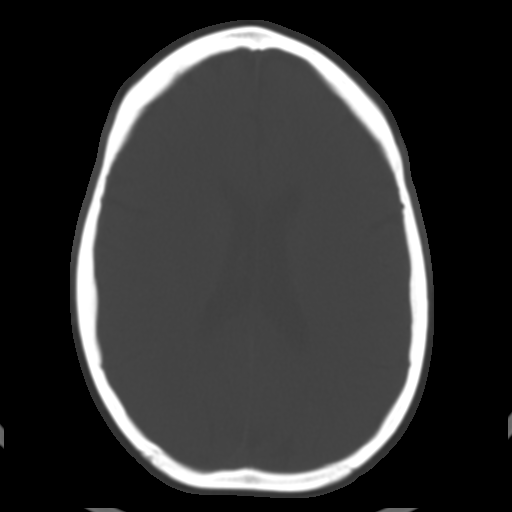
[im 21/31  brain]
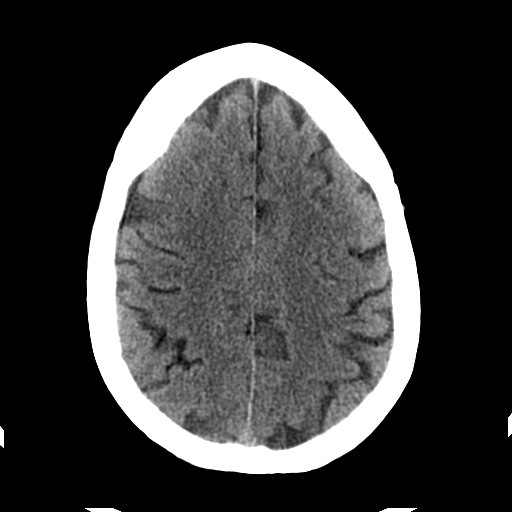
[im 24/31  brain]
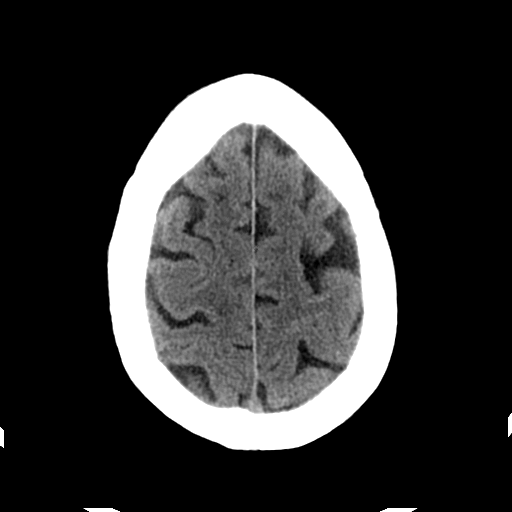
[im 27/31  brain]
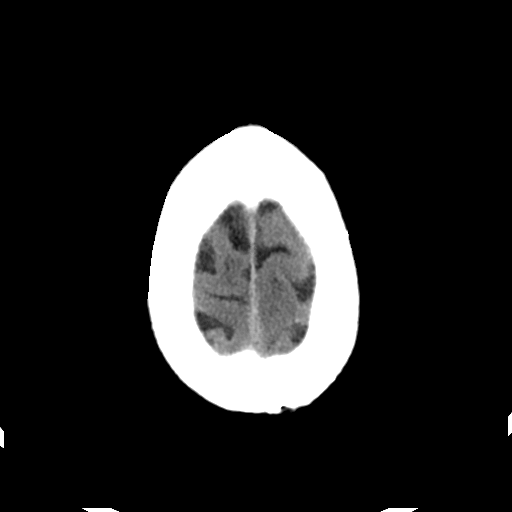

[Series 202: head w/o bone, idose (1) · axial · non-contrast · 0.39mm/px · z∈[+54,+127]mm · 5 of 62 slices shown]
[im 7/62  bone]
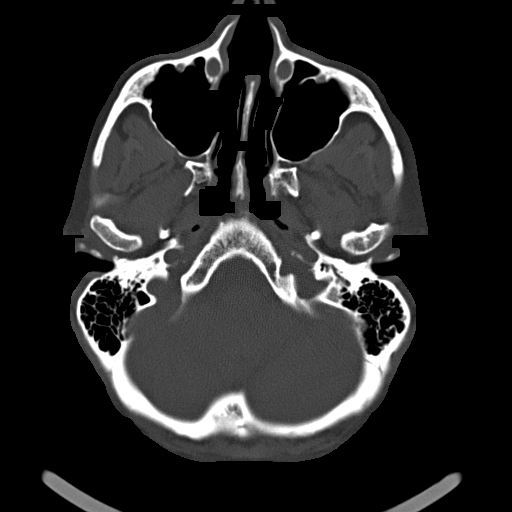
[im 13/62  bone]
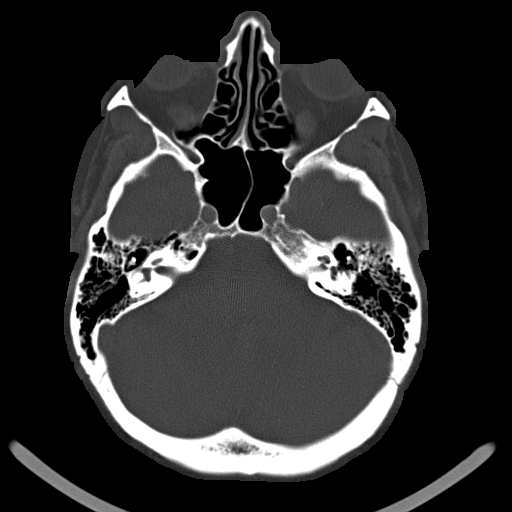
[im 20/62  bone]
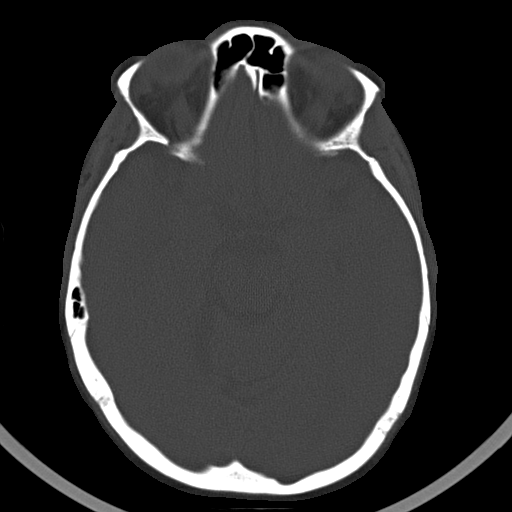
[im 26/62  bone]
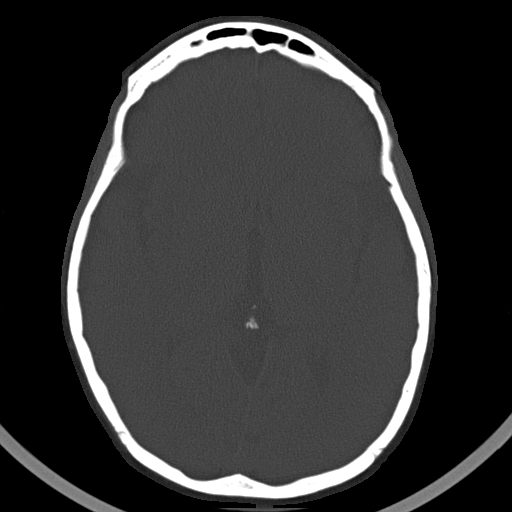
[im 36/62  bone]
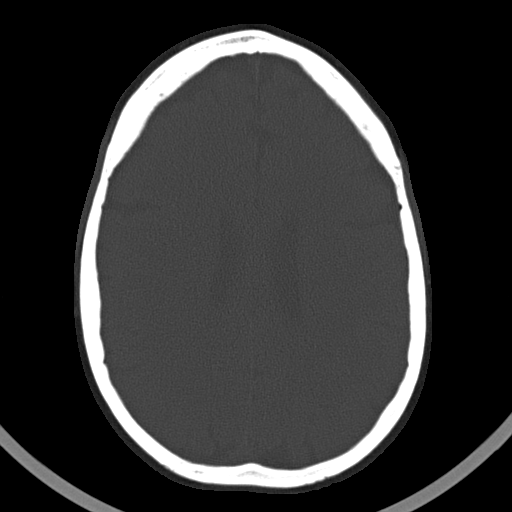

[Series 203: coronal st, idose (1) · coronal · 0.39mm/px · 3 of 65 slices shown]
[im 22/65  brain]
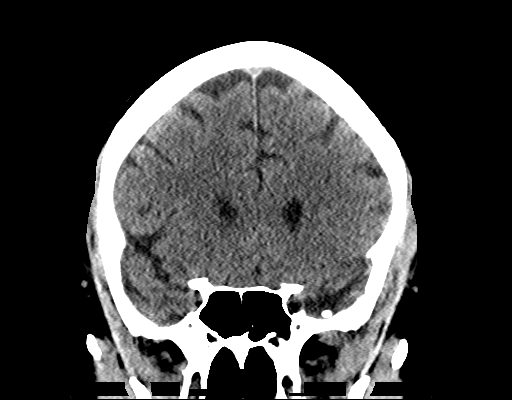
[im 29/65  brain]
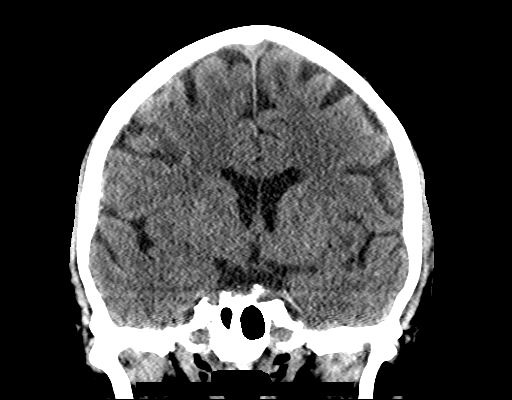
[im 36/65  brain]
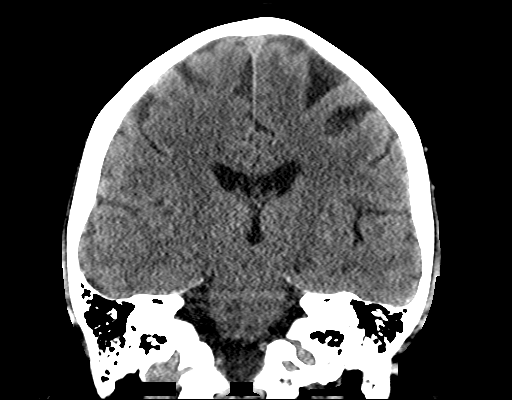

[Series 204: sagittal st, idose (1) · sagittal · 0.39mm/px · 3 of 65 slices shown]
[im 22/65  brain]
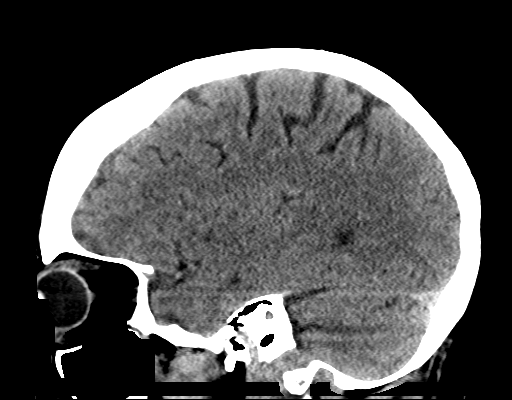
[im 33/65  brain]
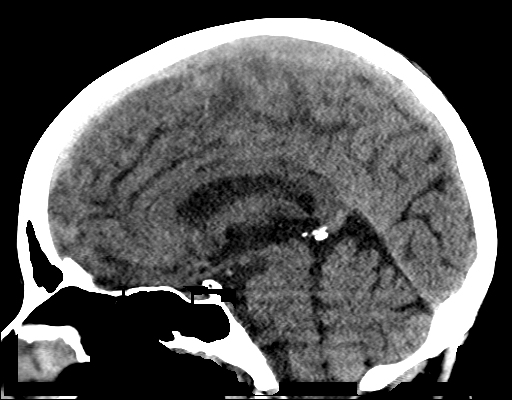
[im 43/65  brain]
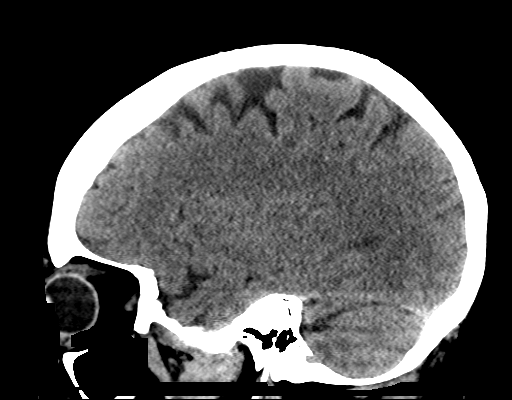

[19 of 47 positions shown; findings below may reference images not displayed]

FINDINGS: Ventricles and sulci are appropriate for patient's age. No evidence
for acute cortically based infarct, intracranial hemorrhage, mass
lesion or mass-effect. Orbits are unremarkable. Mucosal thickening
involving the maxillary sinuses bilaterally. Mastoid air cells
unremarkable. Calvarium is intact.
IMPRESSION: No acute intracranial process.

Paranasal sinus mucosal thickening.

## 2016-11-10 IMAGING — CR DG FOREARM 2V*R*
2 series · 2 of 2 positions shown · non-contrast
Comparison: None.

CLINICAL DATA: Tripped and fell landing on her head and right arm.
Pain, bruising and swelling posteriorly.

EXAM:
RIGHT FOREARM - 2 VIEW

[forearm ap]
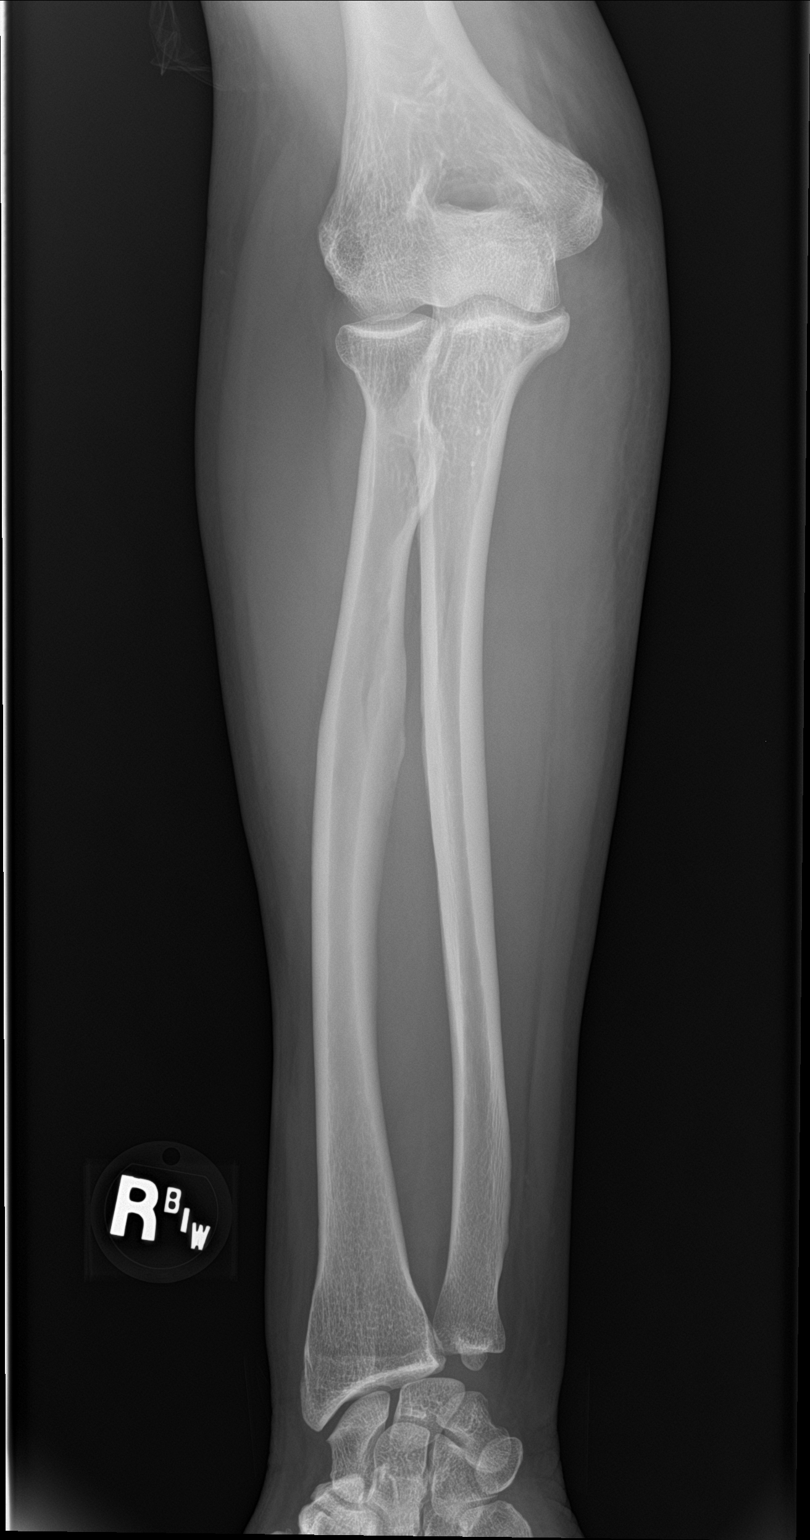

[forearm lat]
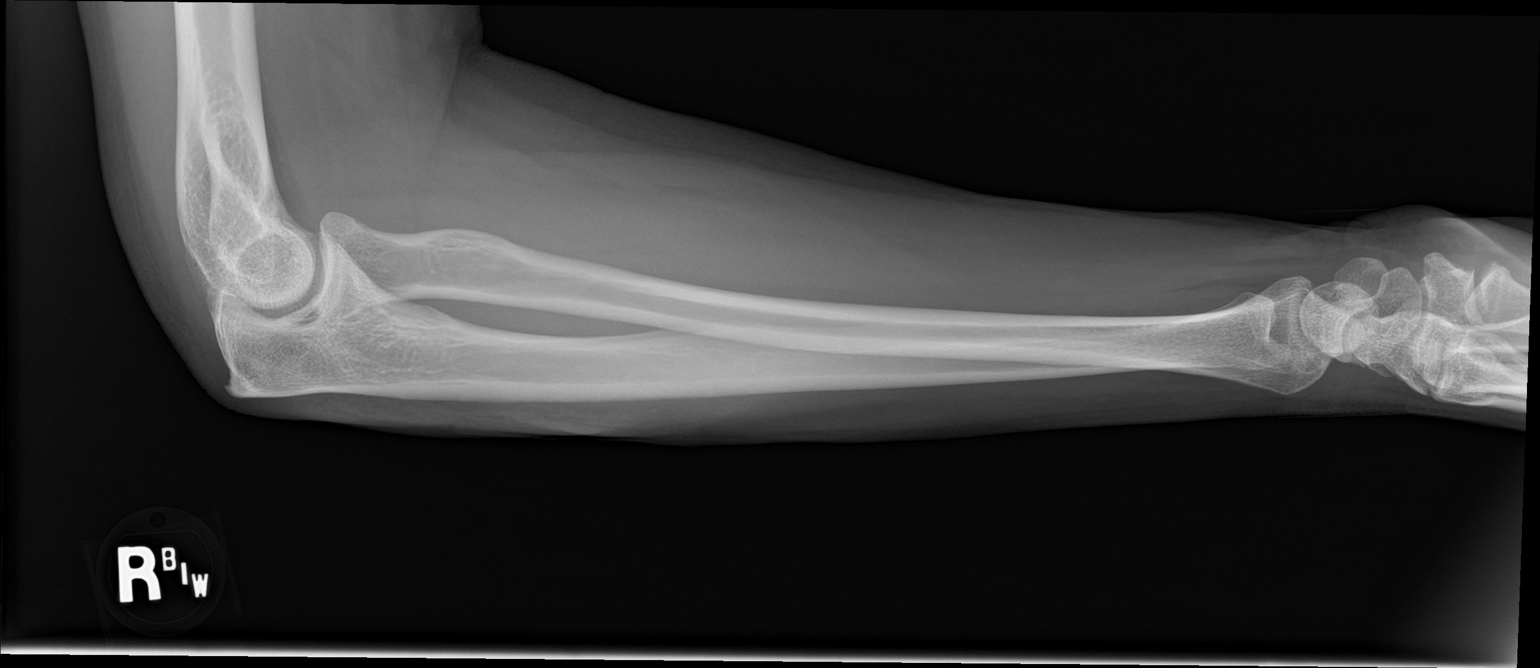

[2 of 2 positions shown; findings below may reference images not displayed]

FINDINGS: Examination demonstrates no evidence of fracture dislocation. Mild
ulnar minus variance is present. Remainder of the exam is within
normal.
IMPRESSION: No acute findings.

## 2016-11-12 ENCOUNTER — Other Ambulatory Visit: Payer: 59

## 2016-11-15 ENCOUNTER — Encounter: Payer: 59 | Admitting: Family Medicine

## 2016-11-21 ENCOUNTER — Other Ambulatory Visit (INDEPENDENT_AMBULATORY_CARE_PROVIDER_SITE_OTHER): Payer: 59

## 2016-11-21 ENCOUNTER — Other Ambulatory Visit: Payer: Self-pay

## 2016-11-21 DIAGNOSIS — E538 Deficiency of other specified B group vitamins: Secondary | ICD-10-CM | POA: Diagnosis not present

## 2016-11-21 DIAGNOSIS — E7800 Pure hypercholesterolemia, unspecified: Secondary | ICD-10-CM

## 2016-11-21 DIAGNOSIS — E119 Type 2 diabetes mellitus without complications: Secondary | ICD-10-CM

## 2016-11-21 LAB — COMPREHENSIVE METABOLIC PANEL
ALBUMIN: 4.2 g/dL (ref 3.5–5.2)
ALK PHOS: 112 U/L (ref 39–117)
ALT: 100 U/L — ABNORMAL HIGH (ref 0–35)
AST: 73 U/L — ABNORMAL HIGH (ref 0–37)
BUN: 5 mg/dL — ABNORMAL LOW (ref 6–23)
CALCIUM: 9.7 mg/dL (ref 8.4–10.5)
CO2: 28 mEq/L (ref 19–32)
Chloride: 102 mEq/L (ref 96–112)
Creatinine, Ser: 0.71 mg/dL (ref 0.40–1.20)
GFR: 91.24 mL/min (ref >60.00)
Glucose, Bld: 198 mg/dL — ABNORMAL HIGH (ref 70–99)
POTASSIUM: 4.9 meq/L (ref 3.5–5.1)
Sodium: 139 mEq/L (ref 135–145)
TOTAL PROTEIN: 6.7 g/dL (ref 6.0–8.3)
Total Bilirubin: 0.7 mg/dL (ref 0.2–1.2)

## 2016-11-21 LAB — LIPID PANEL
CHOLESTEROL: 231 mg/dL — AB (ref 0–200)
HDL: 69.6 mg/dL (ref >39.00)
LDL Cholesterol: 135 mg/dL — ABNORMAL HIGH (ref 0–99)
NONHDL: 161.5
TRIGLYCERIDES: 135 mg/dL (ref 0.0–149.0)
Total CHOL/HDL Ratio: 3
VLDL: 27 mg/dL (ref 0.0–40.0)

## 2016-11-21 LAB — VITAMIN B12: VITAMIN B 12: 965 pg/mL — AB (ref 211–911)

## 2016-11-21 LAB — HEMOGLOBIN A1C: HEMOGLOBIN A1C: 7.3 % — AB (ref 4.6–6.5)

## 2016-12-11 ENCOUNTER — Encounter: Payer: 59 | Admitting: Family Medicine

## 2016-12-17 ENCOUNTER — Other Ambulatory Visit: Payer: Self-pay | Admitting: *Deleted

## 2016-12-17 NOTE — Telephone Encounter (Signed)
Patient left a voicemail requesting a refill on oxycodone. Last refill 11/01/16 #60 Last office visit 08/07/16

## 2016-12-18 MED ORDER — OXYCODONE-ACETAMINOPHEN 5-325 MG PO TABS: 1.0000 | ORAL_TABLET | Freq: Every evening | ORAL | 0 refills | Status: DC | PRN

## 2016-12-18 NOTE — Telephone Encounter (Signed)
Pain Contract signed [place]   Last UDS 09/2016 NCCSR reviewed on 11/01/2016.Marland Kitchen. Found under Lossie FaesMcNulty, [name] ( not [name]) DX: chronic neuropathic pain in bilateral legs Last OV pain meds discussed: 08/2016

## 2016-12-19 NOTE — Telephone Encounter (Signed)
Dr. Ermalene SearingBedsole approved refill and has signed rx. This has been placed up front for pick up. Attempted to reach pt, phone went straight to vm. Left vm to call back

## 2016-12-27 ENCOUNTER — Encounter: Payer: 59 | Admitting: Family Medicine

## 2017-01-06 ENCOUNTER — Other Ambulatory Visit: Payer: Self-pay | Admitting: Family Medicine

## 2017-01-07 ENCOUNTER — Other Ambulatory Visit: Payer: Self-pay | Admitting: Family Medicine

## 2017-01-18 ENCOUNTER — Encounter: Payer: 59 | Admitting: Family Medicine

## 2017-01-23 ENCOUNTER — Encounter: Payer: 59 | Admitting: Family Medicine

## 2017-03-04 ENCOUNTER — Other Ambulatory Visit: Payer: Self-pay | Admitting: Family Medicine

## 2017-03-04 MED ORDER — METOPROLOL TARTRATE 50 MG PO TABS: 50.0000 mg | ORAL_TABLET | Freq: Two times a day (BID) | ORAL | 0 refills | Status: DC

## 2017-03-04 NOTE — Telephone Encounter (Addendum)
Last office visit 08/07/2016.  CPE scheduled for 03/19/2017.  Last refilled 12/18/2016 for #60 with no refills.  UDS/Contract 09/19/2016. Left message for Jae DireKate that her prescription is ready to be picked up at the front desk.

## 2017-03-04 NOTE — Telephone Encounter (Signed)
Copied from CRM [phone]. Topic: Quick Communication - See Telephone Encounter >> Mar 04, 2017 12:29 PM Landry MellowFoltz, [name] J wrote: CRM for notification. See Telephone encounter for:  03/04/17. Pt called - she needs refill on  metoprolol (LOPRESSOR) 50 MG tablet  oxyCODONE-acetaminophen (PERCOCET/ROXICET) 5-325 MG tablet   Pt [facility] pharmacy to [facility]  Please call when ready for pickup [phone]

## 2017-03-04 NOTE — Telephone Encounter (Signed)
Requesting narcotic.  FYI: Triage nurse didn't address either medication on this request.

## 2017-03-05 MED ORDER — OXYCODONE-ACETAMINOPHEN 5-325 MG PO TABS: 1.0000 | ORAL_TABLET | Freq: Every evening | ORAL | 0 refills | Status: DC | PRN

## 2017-03-05 NOTE — Telephone Encounter (Signed)
Correction CPX 03/2017.   Okay to refill

## 2017-03-18 ENCOUNTER — Telehealth: Payer: Self-pay | Admitting: Family Medicine

## 2017-03-18 NOTE — Telephone Encounter (Signed)
Pt called to r/s her cpx appointment from 11/13 to 05/20/16.  She had her labs done 11/21/16 she wanted to know if she needs to get her labs redone

## 2017-03-19 ENCOUNTER — Encounter: Payer: 59 | Admitting: Family Medicine

## 2017-03-19 NOTE — Telephone Encounter (Signed)
Jae DireKate notified as instructed by telephone.  Fasting lab appointment scheduled for 05/13/17 at 7:55am.

## 2017-03-19 NOTE — Telephone Encounter (Signed)
Yes will need labs prior to appt  given she has DM... Let her know cholesterol was to high and DM not at goal.. Work on low carb and low chol diet.

## 2017-03-29 ENCOUNTER — Other Ambulatory Visit: Payer: Self-pay | Admitting: Family Medicine

## 2017-04-09 ENCOUNTER — Other Ambulatory Visit: Payer: Self-pay | Admitting: Sports Medicine

## 2017-04-09 DIAGNOSIS — M25511 Pain in right shoulder: Secondary | ICD-10-CM

## 2017-04-10 ENCOUNTER — Other Ambulatory Visit: Payer: Self-pay

## 2017-04-10 DIAGNOSIS — M25511 Pain in right shoulder: Secondary | ICD-10-CM

## 2017-04-10 MED ORDER — GABAPENTIN 300 MG PO CAPS: ORAL_CAPSULE | ORAL | 0 refills | Status: DC

## 2017-04-24 ENCOUNTER — Other Ambulatory Visit: Payer: Self-pay | Admitting: Family Medicine

## 2017-04-24 MED ORDER — METOPROLOL TARTRATE 50 MG PO TABS: 50.0000 mg | ORAL_TABLET | Freq: Two times a day (BID) | ORAL | 0 refills | Status: DC

## 2017-04-24 NOTE — Telephone Encounter (Signed)
Pt request refill oxycodone apap (last printed # 60 on 03/05/17) and metoprolol last refilled # 60 on 03/04/17.[facility]; Seen 08/07/16 and scheduled for annual on 05/17/17 UDS 09/19/16. Metoprolol refilled per protocol to [facility]. Dr Ermalene SearingBedsole out of office until 04/26/17.

## 2017-04-24 NOTE — Telephone Encounter (Signed)
Copied from CRM [phone]. Topic: Quick Communication - See Telephone Encounter >> Apr 24, 2017  8:48 AM Arlyss Gandyichardson, Revel Stellmach N, NT wrote: CRM for notification. See Telephone encounter for: Need refill of oxycodone-husband will pick up, and metoprolol. Pt will be out of metoprolol before her appt with Dr. Ermalene SearingBedsole in January, if this can be refilled before she runs out. [facility] [address]   04/24/17.

## 2017-04-24 NOTE — Telephone Encounter (Signed)
[name] notified as instructed by telephone.  She is unable to come in at this time. Wants to wait and discuss refill at her CPE on May 17, 2017

## 2017-04-24 NOTE — Telephone Encounter (Signed)
This is not legal based on [place] STOP act legislation.

## 2017-04-24 NOTE — Telephone Encounter (Signed)
Controlled substance.  Also wants metoprolol refill.  Thanks

## 2017-04-24 NOTE — Telephone Encounter (Signed)
Have pt make an appt fri or next Tuesday for controlled substance visit (last OV 8 months ago).....given the [place] STOP ACT created to address the opioid epidemic ongoing.Mallory Stewart. [place] has instituted additional requirements per  mandate. It will requires yearly urine drug screening as well as mandatory office visits dedicated for only for controlled substance review EVERY 3 MONTHS.   Denied rx.

## 2017-05-10 ENCOUNTER — Telehealth: Payer: Self-pay | Admitting: Family Medicine

## 2017-05-10 ENCOUNTER — Other Ambulatory Visit: Payer: Self-pay

## 2017-05-10 DIAGNOSIS — E119 Type 2 diabetes mellitus without complications: Secondary | ICD-10-CM

## 2017-05-10 DIAGNOSIS — E538 Deficiency of other specified B group vitamins: Secondary | ICD-10-CM

## 2017-05-10 DIAGNOSIS — E7800 Pure hypercholesterolemia, unspecified: Secondary | ICD-10-CM

## 2017-05-10 NOTE — Telephone Encounter (Signed)
-----   Message from Alvina Chouerri J Walsh sent at 05/02/2017  2:50 PM EST ----- Regarding: Lab orders for Friday, Jan 4.19 Patient is scheduled for CPX labs, please order future labs, Thanks , Camelia Engerri

## 2017-05-13 ENCOUNTER — Other Ambulatory Visit: Payer: Self-pay

## 2017-05-17 ENCOUNTER — Other Ambulatory Visit: Payer: Self-pay

## 2017-05-17 ENCOUNTER — Telehealth: Payer: Self-pay | Admitting: Family Medicine

## 2017-05-17 ENCOUNTER — Encounter: Payer: Self-pay | Admitting: Family Medicine

## 2017-05-17 ENCOUNTER — Ambulatory Visit (INDEPENDENT_AMBULATORY_CARE_PROVIDER_SITE_OTHER): Payer: Managed Care, Other (non HMO) | Admitting: Family Medicine

## 2017-05-17 VITALS — BP 120/80 | HR 81 | Temp 98.6°F | Ht 68.25 in | Wt 178.5 lb

## 2017-05-17 DIAGNOSIS — E7800 Pure hypercholesterolemia, unspecified: Secondary | ICD-10-CM

## 2017-05-17 DIAGNOSIS — Z23 Encounter for immunization: Secondary | ICD-10-CM

## 2017-05-17 DIAGNOSIS — M25511 Pain in right shoulder: Secondary | ICD-10-CM | POA: Diagnosis not present

## 2017-05-17 DIAGNOSIS — G8929 Other chronic pain: Secondary | ICD-10-CM | POA: Insufficient documentation

## 2017-05-17 DIAGNOSIS — I10 Essential (primary) hypertension: Secondary | ICD-10-CM

## 2017-05-17 DIAGNOSIS — Z0000 Encounter for general adult medical examination without abnormal findings: Secondary | ICD-10-CM | POA: Diagnosis not present

## 2017-05-17 DIAGNOSIS — G5793 Unspecified mononeuropathy of bilateral lower limbs: Secondary | ICD-10-CM | POA: Diagnosis not present

## 2017-05-17 DIAGNOSIS — F1120 Opioid dependence, uncomplicated: Secondary | ICD-10-CM

## 2017-05-17 DIAGNOSIS — E119 Type 2 diabetes mellitus without complications: Secondary | ICD-10-CM

## 2017-05-17 LAB — HM DIABETES FOOT EXAM

## 2017-05-17 MED ORDER — OXYCODONE-ACETAMINOPHEN 5-325 MG PO TABS: 1.0000 | ORAL_TABLET | Freq: Every evening | ORAL | 0 refills | Status: DC | PRN

## 2017-05-17 NOTE — Progress Notes (Signed)
Subjective:    Patient ID: Mallory Stewart, female    DOB: 03-22-1961, 57 y.o.   MRN: [id number]  HPI   The patient is here for annual wellness exam and preventative care.    Hypertension:   good control on lisinopril and metoprolol.   BP Readings from Last 3 Encounters:  05/17/17 120/80  10/14/16 (!) 166/100  09/17/16 (!) 150/92  Using medication without problems or lightheadedness:  none Chest pain with exertion: none Edema:none Short of breath: none Average home BPs: good Other issues:  Elevated Cholesterol:  LDL was not at goal last check on no med. Lab Results  Component Value Date   CHOL 231 (H) 11/21/2016   HDL 69.60 11/21/2016   LDLCALC 135 (H) 11/21/2016   LDLDIRECT 160.5 02/20/2012   TRIG 135.0 11/21/2016   CHOLHDL 3 11/21/2016  Using medications without problems: Muscle aches:  Diet compliance: good, improving.. Now gluten free Exercise: walking.. Daily few miles. Other complaints:  Diabetes:  Due for re-eval.  Lab Results  Component Value Date   HGBA1C 7.3 (H) 11/21/2016  Using medications without difficulties: Hypoglycemic episodes: none Hyperglycemic episodes: none Feet problems: neuropathy, no ulcers Blood Sugars averaging: FBS 89-130 eye exam within last year: Due for yearly eye exam   She has lost 6 lbs in last year. Wt Readings from Last 3 Encounters:  05/17/17 178 lb 8 oz (81 kg)  09/17/16 173 lb (78.5 kg)  08/07/16 184 lb 8 oz (83.7 kg)   She is also here for chronic pain management. Indication for chronic opioid: chronic neuropathic pain in legs Medication and dose:  She is using percocet at 1-2 tabs in night.. Only takes if wakes up at night and cannot sleep with pain. Pain on pain scale 7-8  No using during the day.  She is using  600 mg at bedtime...  Has not tried higher dose # pills per month: 60 Last UDS date:  09/19/2016 Pain contract signed (Y/N): 09/20/26 Date narcotic database last reviewed (include red flags):  none  She has had extensive work up by neuro for causes of neuropathic pain. B12 was low and she was started on injections > 6 months ago. Had EMG which was normal. Unremarkable MRI thoracic spine and brain  MRI Cervical spine w/wo (05/18/13): no cord signal abnormalities. Right foraminal and lateral recess impingement of C5 and C6 nerve roots. Previously followed at [facility] Dr. Ezequiel Essex. Multiple nerve conduction inconclusive, nerves looked good, but no reflexes. She has had no improvement at all, just worsening with b12.  small nerve test.  tried integrative therapy. Only minimal relief for infra red treatments and cranioscaral therapy. Minimal improvement with acupunture.  She was seen at pain clinic.  Review of Systems  Constitutional: Negative for fatigue and fever.  HENT: Negative for congestion.   Eyes: Negative for pain.  Respiratory: Negative for cough and shortness of breath.   Cardiovascular: Negative for chest pain, palpitations and leg swelling.  Gastrointestinal: Negative for abdominal pain.  Genitourinary: Negative for dysuria and vaginal bleeding.  Musculoskeletal: Negative for back pain.  Neurological: Negative for syncope, light-headedness and headaches.  Psychiatric/Behavioral: Negative for dysphoric mood.       Objective:   Physical Exam  Constitutional: Vital signs are normal. She appears well-developed and well-nourished. She is cooperative.  Non-toxic appearance. She does not appear ill. No distress.  HENT:  Head: Normocephalic.  Right Ear: Hearing, tympanic membrane, external ear and ear canal normal.  Left Ear: Hearing, tympanic  membrane, external ear and ear canal normal.  Nose: Nose normal.  Eyes: Conjunctivae, EOM and lids are normal. Pupils are equal, round, and reactive to light. Lids are everted and swept, no foreign bodies found.  Neck: Trachea normal and normal range of motion. Neck supple. Carotid bruit is not present. No thyroid  mass and no thyromegaly present.  Cardiovascular: Normal rate, regular rhythm, S1 normal, S2 normal, normal heart sounds and intact distal pulses. Exam reveals no gallop.  No murmur heard. Pulmonary/Chest: Effort normal and breath sounds normal. No respiratory distress. She has no wheezes. She has no rhonchi. She has no rales.  Abdominal: Soft. Normal appearance and bowel sounds are normal. She exhibits no distension, no fluid wave, no abdominal bruit and no mass. There is no hepatosplenomegaly. There is no tenderness. There is no rebound, no guarding and no CVA tenderness. No hernia.  Lymphadenopathy:    She has no cervical adenopathy.    She has no axillary adenopathy.  Neurological: She is alert. She has normal strength. No cranial nerve deficit or sensory deficit.  Skin: Skin is warm, dry and intact. No rash noted.  Psychiatric: Her speech is normal and behavior is normal. Judgment normal. Her mood appears not anxious. Cognition and memory are normal. She does not exhibit a depressed mood.      Diabetic foot exam: Normal inspection No skin breakdown No calluses  Normal DP pulses no sensation to light touch and monofilament from knee down Nails normal     Assessment & Plan:  The patient's preventative maintenance and recommended screening tests for an annual wellness exam were reviewed in full today. Brought up to date unless services declined.  Counselled on the importance of diet, exercise, and its role in overall health and mortality. The patient's FH and SH was reviewed, including their home life, tobacco status, and drug and alcohol status.   Vaccines: uptodate with Td, given  Flu today Mammo: scheduled PAP/DVE: 09/2015 nml neg HPV.Marland Kitchen. Plan repeat in 5 years  no family hx of Ov or uterine cancer, asymptomatic. No menses at this point. Colon:  Looking into Nonsmoker  Hep C: done  HIV; refused

## 2017-05-17 NOTE — Assessment & Plan Note (Signed)
Due for re-eval. 

## 2017-05-17 NOTE — Telephone Encounter (Addendum)
PA completed on CoverMyMeds and approved from 05/17/2017 through 11/13/2017.  Jae DireKate notified of approval via voicemail.  [facility] notified via telephone.

## 2017-05-17 NOTE — Assessment & Plan Note (Addendum)
Indication for chronic opioid: chronic neuropathic pain in legs Medication and dose:  She is using percocet at 1-2 tabs in night.. Only takes if wakes up at night and cannot sleep with pain. Pain on pain scale 7-8  No using during the day.  She is using  600 mg at bedtime...  Has not tried higher dose # pills per month: 60 Last UDS date:  09/19/2016 Pain contract signed (Y/N): 09/20/26 Date narcotic database last reviewed (include red flags):    Given 1 month of percocet  #60 0 rf. She will follow up for re-eval in 1 month... To hopefull see if we can decrease or come off percocet after increase of gabapentin.

## 2017-05-17 NOTE — Assessment & Plan Note (Signed)
Will try to increase gabapentin to decrease percocet use. Follow up in 1 month.

## 2017-05-17 NOTE — Patient Instructions (Addendum)
Set up mammogram on yuor own as well as yearly eye exam.  Increase gabapentin to 900 mg daily.  Try to stop percocet. Call with choice of cologuard or hemeoccult.

## 2017-05-17 NOTE — Assessment & Plan Note (Signed)
Well controlled. Continue current medication.  

## 2017-05-17 NOTE — Telephone Encounter (Signed)
Copied from CRM [id number]. Topic: Quick Communication - Rx Refill/Question >> May 17, 2017 11:49 AM Oneal GroutSebastian, Jennifer S wrote: Medication: oxyCODONE-acetaminophen (PERCOCET/ROXICET) 5-325 MG tablet, insurance will only cover 1 week supply. Will need PA, insurance Monia Pouchetna [phone]   Has the patient contacted their pharmacy? Yes.     (Agent: If no, request that the patient contact the pharmacy for the refill.)   Preferred Pharmacy (with phone number or street name): [facility]   Agent: Please be advised that RX refills may take up to 3 business days. We ask that you follow-up with your pharmacy.

## 2017-05-21 NOTE — Telephone Encounter (Signed)
Spoke with [employer] and re-submitted the PA for Percocet via telephone.  PA approved x 6 months.  [facility] and ran Rx through. It did go through for $10.  Mallory Stewart notified by telephone that everything should be taken care of and she should be able to pick up her Rx today.

## 2017-05-21 NOTE — Telephone Encounter (Signed)
Patient called back & said [facility] still does not have the authorization for her percocet. Please advise.

## 2017-06-12 ENCOUNTER — Other Ambulatory Visit: Payer: Self-pay | Admitting: Family Medicine

## 2017-06-17 ENCOUNTER — Other Ambulatory Visit: Payer: Managed Care, Other (non HMO)

## 2017-06-18 ENCOUNTER — Other Ambulatory Visit: Payer: Managed Care, Other (non HMO)

## 2017-06-21 ENCOUNTER — Ambulatory Visit: Payer: Managed Care, Other (non HMO) | Admitting: Family Medicine

## 2017-06-22 ENCOUNTER — Other Ambulatory Visit: Payer: Self-pay | Admitting: Sports Medicine

## 2017-06-28 NOTE — Telephone Encounter (Signed)
From med list appears rx done on 05/17/17.

## 2017-07-02 ENCOUNTER — Other Ambulatory Visit (INDEPENDENT_AMBULATORY_CARE_PROVIDER_SITE_OTHER): Payer: Managed Care, Other (non HMO)

## 2017-07-02 ENCOUNTER — Telehealth: Payer: Self-pay | Admitting: Family Medicine

## 2017-07-02 DIAGNOSIS — E538 Deficiency of other specified B group vitamins: Secondary | ICD-10-CM

## 2017-07-02 DIAGNOSIS — E119 Type 2 diabetes mellitus without complications: Secondary | ICD-10-CM

## 2017-07-02 LAB — COMPREHENSIVE METABOLIC PANEL
ALBUMIN: 4.2 g/dL (ref 3.5–5.2)
ALK PHOS: 60 U/L (ref 39–117)
ALT: 36 U/L — ABNORMAL HIGH (ref 0–35)
AST: 42 U/L — AB (ref 0–37)
BILIRUBIN TOTAL: 0.8 mg/dL (ref 0.2–1.2)
BUN: 12 mg/dL (ref 6–23)
CO2: 27 mEq/L (ref 19–32)
CREATININE: 0.89 mg/dL (ref 0.40–1.20)
Calcium: 10 mg/dL (ref 8.4–10.5)
Chloride: 100 mEq/L (ref 96–112)
GFR: 70.13 mL/min (ref >60.00)
Glucose, Bld: 145 mg/dL — ABNORMAL HIGH (ref 70–99)
Potassium: 5.7 mEq/L — ABNORMAL HIGH (ref 3.5–5.1)
SODIUM: 139 meq/L (ref 135–145)
TOTAL PROTEIN: 6.9 g/dL (ref 6.0–8.3)

## 2017-07-02 LAB — LIPID PANEL
CHOLESTEROL: 245 mg/dL — AB (ref 0–200)
HDL: 93.5 mg/dL (ref >39.00)
LDL Cholesterol: 125 mg/dL — ABNORMAL HIGH (ref 0–99)
NonHDL: 151.53
Total CHOL/HDL Ratio: 3
Triglycerides: 134 mg/dL (ref 0.0–149.0)
VLDL: 26.8 mg/dL (ref 0.0–40.0)

## 2017-07-02 LAB — VITAMIN B12: Vitamin B-12: 617 pg/mL (ref 211–911)

## 2017-07-02 LAB — HEMOGLOBIN A1C: Hgb A1c MFr Bld: 6.9 % — ABNORMAL HIGH (ref 4.6–6.5)

## 2017-07-02 NOTE — Telephone Encounter (Signed)
Copied from CRM [id number]. Topic: Quick Communication - Lab Results >> Jul 02, 2017  2:22 PM Damita LackLoring, Donna S, CMA wrote: Called patient to inform them of lab results. When patient returns call, triage nurse may disclose results. >> Jul 02, 2017  5:39 PM Raquel SarnaHayes, Teresa G wrote: Pt returned missed call.  She is only wanting to speak w/ Dr. Daphine DeutscherBedsole's nurse/cma to receive her results.  Please call pt back asap.

## 2017-07-03 NOTE — Telephone Encounter (Signed)
Spoke with Mallory Stewart last night, 07/02/2017, in regard to her lab results.

## 2017-07-05 ENCOUNTER — Telehealth: Payer: Self-pay

## 2017-07-05 ENCOUNTER — Ambulatory Visit: Payer: Managed Care, Other (non HMO) | Admitting: Family Medicine

## 2017-07-05 ENCOUNTER — Other Ambulatory Visit: Payer: Self-pay | Admitting: Family Medicine

## 2017-07-05 NOTE — Telephone Encounter (Signed)
Copied from CRM [id number]. Topic: Quick Communication - Appointment Cancellation >> Jul 05, 2017  8:01 AM Percival SpanishKennedy, Cheryl W wrote: Patient called to cancel appointment scheduled  for today 07/05/17  and rescheduled for 07/12/17/   Route to department's PEC pool.

## 2017-07-05 NOTE — Telephone Encounter (Signed)
Pt called on 07/05/17 at 8 Am to cancel 07/05/17 8:30 appt with Dr Ermalene SearingBedsole; no reason given for cancellation.  Patient No Showed/ Canceled appt within 24 hours.   Please determine one of the following actions:   A. No follow-up necessary.  B. Follow-up urgent. Locate patient immediately.  C. Follow-up necessary. Contact patient and schedule visit in _______ days.  D. Follow-up advised. Contact patient and schedule visit in _______ weeks.    Please determine CHARGE or NO CHARGE.   Thank you,

## 2017-07-05 NOTE — Telephone Encounter (Signed)
A and charge

## 2017-07-09 ENCOUNTER — Other Ambulatory Visit (INDEPENDENT_AMBULATORY_CARE_PROVIDER_SITE_OTHER): Payer: Managed Care, Other (non HMO)

## 2017-07-09 ENCOUNTER — Telehealth: Payer: Self-pay | Admitting: Family Medicine

## 2017-07-09 ENCOUNTER — Encounter: Payer: Self-pay | Admitting: Family Medicine

## 2017-07-09 ENCOUNTER — Ambulatory Visit: Payer: Managed Care, Other (non HMO) | Admitting: Family Medicine

## 2017-07-09 ENCOUNTER — Ambulatory Visit: Payer: Self-pay | Admitting: *Deleted

## 2017-07-09 VITALS — BP 144/88 | HR 89 | Temp 99.9°F | Ht 68.25 in | Wt 178.0 lb

## 2017-07-09 DIAGNOSIS — R112 Nausea with vomiting, unspecified: Secondary | ICD-10-CM | POA: Diagnosis not present

## 2017-07-09 LAB — COMPREHENSIVE METABOLIC PANEL
ALBUMIN: 4.5 g/dL (ref 3.5–5.2)
ALK PHOS: 68 U/L (ref 39–117)
ALT: 32 U/L (ref 0–35)
AST: 28 U/L (ref 0–37)
BILIRUBIN TOTAL: 1.3 mg/dL — AB (ref 0.2–1.2)
BUN: 11 mg/dL (ref 6–23)
CALCIUM: 9.6 mg/dL (ref 8.4–10.5)
CHLORIDE: 95 meq/L — AB (ref 96–112)
CO2: 29 mEq/L (ref 19–32)
CREATININE: 0.89 mg/dL (ref 0.40–1.20)
GFR: 70.13 mL/min (ref >60.00)
Glucose, Bld: 265 mg/dL — ABNORMAL HIGH (ref 70–99)
Potassium: 4.9 mEq/L (ref 3.5–5.1)
SODIUM: 134 meq/L — AB (ref 135–145)
TOTAL PROTEIN: 7.5 g/dL (ref 6.0–8.3)

## 2017-07-09 LAB — C-REACTIVE PROTEIN: CRP: 6.9 mg/dL (ref 0.5–20.0)

## 2017-07-09 LAB — CBC WITH DIFFERENTIAL/PLATELET
BASOS ABS: 0 10*3/uL (ref 0.0–0.1)
Basophils Relative: 0.3 % (ref 0.0–3.0)
EOS ABS: 0 10*3/uL (ref 0.0–0.7)
Eosinophils Relative: 0.6 % (ref 0.0–5.0)
HEMATOCRIT: 39.2 % (ref 36.0–46.0)
HEMOGLOBIN: 13.4 g/dL (ref 12.0–15.0)
LYMPHS PCT: 10.5 % — AB (ref 12.0–46.0)
Lymphs Abs: 0.7 10*3/uL (ref 0.7–4.0)
MCHC: 34.1 g/dL (ref 30.0–36.0)
MCV: 94.3 fl (ref 78.0–100.0)
MONO ABS: 0.5 10*3/uL (ref 0.1–1.0)
Monocytes Relative: 7.7 % (ref 3.0–12.0)
Neutro Abs: 5.2 10*3/uL (ref 1.4–7.7)
Neutrophils Relative %: 80.9 % — ABNORMAL HIGH (ref 43.0–77.0)
Platelets: 135 10*3/uL — ABNORMAL LOW (ref 150.0–400.0)
RBC: 4.16 Mil/uL (ref 3.87–5.11)
RDW: 13.2 % (ref 11.5–15.5)
WBC: 6.5 10*3/uL (ref 4.0–10.5)

## 2017-07-09 LAB — AMYLASE: AMYLASE: 73 U/L (ref 27–131)

## 2017-07-09 LAB — LIPASE: LIPASE: 511 U/L — AB (ref 11.0–59.0)

## 2017-07-09 MED ORDER — CYCLOBENZAPRINE HCL 10 MG PO TABS: 10.0000 mg | ORAL_TABLET | Freq: Three times a day (TID) | ORAL | 0 refills | Status: DC | PRN

## 2017-07-09 NOTE — Telephone Encounter (Signed)
Patient is calling to report that she has been vomiting since Saturday- she has been unable to stop it and she has been unable to keep solids and liquids down.  Reason for Disposition . [1] SEVERE vomiting (e.g., 6 or more times/day) AND [2] present > 8 hours  Answer Assessment - Initial Assessment Questions 1. VOMITING SEVERITY: "How many times have you vomited in the past 24 hours?"     - MILD:  1 - 2 times/day    - MODERATE: 3 - 5 times/day, decreased oral intake without significant weight loss or symptoms of dehydration    - SEVERE: 6 or more times/day, vomits everything or nearly everything, with significant weight loss, symptoms of dehydration      Severe- minimal liquid 2. ONSET: "When did the vomiting begin?"      Saturday 3. FLUIDS: "What fluids or food have you vomited up today?" "Have you been able to keep any fluids down?"     Not able to eat or drink- trying to take in ginger ale- not keeping that down 4. ABDOMINAL PAIN: "Are your having any abdominal pain?" If yes : "How bad is it and what does it feel like?" (e.g., crampy, dull, intermittent, constant)      Feels more from vomiting- was more constant sharp- now more gassy  5. DIARRHEA: "Is there any diarrhea?" If so, ask: "How many times today?"      no 6. CONTACTS: "Is there anyone else in the family with the same symptoms?"      1 week ago- niece was ill 7. CAUSE: "What do you think is causing your vomiting?"     viral 8. HYDRATION STATUS: "Any signs of dehydration?" (e.g., dry mouth [not only dry lips], too weak to stand) "When did you last urinate?"     Dry mouth, ringing in head, this morning- minimal 9. OTHER SYMPTOMS: "Do you have any other symptoms?" (e.g., fever, headache, vertigo, vomiting blood or coffee grounds, recent head injury)     vertigo 1 week ago- standing makes her sick 10. PREGNANCY: "Is there any chance you are pregnant?" "When was your last menstrual period?"       n/a  Protocols used:  [facility]

## 2017-07-09 NOTE — Telephone Encounter (Signed)
Spoke with patient about her labs. Lipase was elevated. Symptoms could be related to pancreatitis. If symptoms continue for the next couple of days then would consider obtaining a CT ab/pelvis.   Myra RudeSchmitz, Meka Lewan E, MD [facility] 07/09/2017, 4:54 PM

## 2017-07-09 NOTE — Patient Instructions (Signed)
We will call you with the results from today.  

## 2017-07-09 NOTE — Progress Notes (Signed)
Mallory Stewart - 57 y.o. female MRN [id number]  Date of birth: 08-Jun-1960  SUBJECTIVE:  Including CC & ROS.  Chief Complaint  Patient presents with  . Nausea    Mallory Stewart is a 57 y.o. female that is presenting with nausea and vomiting. Her symptoms have been ongoing for four days. Admits to body aches. Denies fevers. Abdominal pain present. Denies diarrhea. Has not been around anyone with similar symptoms. Denies any food triggers.She has not been able to tolerate food. She usually 6 with a gluten free diet but did eat [place] food on Saturday. She is still having flatus but has not had any bowel movements for the past couple of days. Feels like her symptoms are staying the same. Her pain is mild in nature. Her vomiting is moderate. She has had roughly 4 episodes of vomiting per day.    Review of Systems  Constitutional: Negative for fever.  HENT: Negative for sore throat.   Respiratory: Negative for shortness of breath.   Cardiovascular: Negative for chest pain.  Gastrointestinal: Positive for abdominal pain, diarrhea and vomiting.  Musculoskeletal: Positive for back pain.  Skin: Negative for color change.  Neurological: Negative for weakness.  Hematological: Negative for adenopathy.  Psychiatric/Behavioral: Negative for agitation.    HISTORY: Past Medical, Surgical, Social, and Family History Reviewed & Updated per EMR.   Pertinent Historical Findings include:  Past Medical History:  Diagnosis Date  . Diabetes (HCC)   . Hypertension   . Pancreatitis     Past Surgical History:  Procedure Laterality Date  . CHOLECYSTECTOMY N/A 07/28/2016   Procedure: LAPAROSCOPIC CHOLECYSTECTOMY WITH INTRAOPERATIVE CHOLANGIOGRAM;  Surgeon: Rodman Pickle, MD;  Location: [facility] OR;  Service: General;  Laterality: N/A;  . LEEP  1990's  . ORIF FOOT FRACTURE  09/2008   L 5th metatarsal  . REFRACTIVE SURGERY  2000  . TONSILLECTOMY      Allergies  Allergen Reactions  .  Simvastatin     Elevated lfts?    Family History  Problem Relation Age of Onset  . Other Mother        tachycardia.Marland KitchenMarland Kitchen?afib  . Stroke Father        after hernia suegery  . Prostate cancer Father   . Other Father        global transient amnesia, unclear source  . Atrial fibrillation Sister   . Healthy Brother   . Healthy Brother   . Coronary artery disease Paternal Grandmother   . Heart attack Paternal Grandmother 67  . Brain cancer Maternal Grandfather        ?  Marland Kitchen Cancer Paternal Grandfather        ?  Marland Kitchen Breast cancer Maternal Grandmother      Social History   Socioeconomic History  . Marital status: Married    Spouse name: Not on file  . Number of children: Not on file  . Years of education: Not on file  . Highest education level: Not on file  Social Needs  . Financial resource strain: Not on file  . Food insecurity - worry: Not on file  . Food insecurity - inability: Not on file  . Transportation needs - medical: Not on file  . Transportation needs - non-medical: Not on file  Occupational History  . 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 Smoking status: Never Smoker  . Smokeless tobacco: Never Used  Substance and Sexual Activity  . Alcohol use: Yes    Alcohol/week: 4.2  oz    Types: 7 Glasses of wine per week    Comment: wine  . Drug use: No  . Sexual activity: Not on file  Other Topics Concern  . Not on file  Social History Narrative   Regular exercise , walking 4-5 times a week   Diet fruits and veggies, water     PHYSICAL EXAM:  VS: BP (!) 144/88 (BP Location: Left Arm, Patient Position: Sitting, Cuff Size: Normal)   Pulse 89   Temp 99.9 F (37.7 C) (Oral)   Ht 5' 8.25" (1.734 m)   Wt 178 lb (80.7 kg)   SpO2 97%   BMI 26.87 kg/m  Physical Exam Gen: NAD, alert, cooperative with exam,  ENT: normal lips, normal nasal mucosa,  Eye: normal EOM, normal conjunctiva and lids CV:  no edema, +2 pedal pulses, regular rate and  rhythm   Resp: no accessory muscle use, non-labored, clear to auscultation bilaterally GI: no masses, soft, hypoactive bowel sounds, some tenderness in the upper quadrant,, no hernia  Skin: no rashes, no areas of induration  Neuro: normal tone, normal sensation to touch Psych:  normal insight, alert and oriented MSK: Normal gait, normal strength      ASSESSMENT & PLAN:   Non-intractable vomiting with nausea Possibly she has a component of viral gastroenteritis. She has a history of pancreatitis from gallstones and a cholecystectomy. Does not look fluid down today. - Obtain lab work - Counseled on supportive therapies - Given indications to follow-up and see care

## 2017-07-10 NOTE — Assessment & Plan Note (Signed)
Possibly she has a component of viral gastroenteritis. She has a history of pancreatitis from gallstones and a cholecystectomy. Does not look fluid down today. - Obtain lab work - Counseled on supportive therapies - Given indications to follow-up and see care

## 2017-07-12 ENCOUNTER — Other Ambulatory Visit: Payer: Self-pay

## 2017-07-12 ENCOUNTER — Encounter: Payer: Self-pay | Admitting: Family Medicine

## 2017-07-12 ENCOUNTER — Ambulatory Visit: Payer: Managed Care, Other (non HMO) | Admitting: Family Medicine

## 2017-07-12 DIAGNOSIS — E119 Type 2 diabetes mellitus without complications: Secondary | ICD-10-CM | POA: Diagnosis not present

## 2017-07-12 DIAGNOSIS — A084 Viral intestinal infection, unspecified: Secondary | ICD-10-CM | POA: Diagnosis not present

## 2017-07-12 DIAGNOSIS — G5793 Unspecified mononeuropathy of bilateral lower limbs: Secondary | ICD-10-CM

## 2017-07-12 DIAGNOSIS — M62830 Muscle spasm of back: Secondary | ICD-10-CM | POA: Diagnosis not present

## 2017-07-12 DIAGNOSIS — F1120 Opioid dependence, uncomplicated: Secondary | ICD-10-CM

## 2017-07-12 MED ORDER — OXYCODONE-ACETAMINOPHEN 5-325 MG PO TABS: 1.0000 | ORAL_TABLET | Freq: Every evening | ORAL | 0 refills | Status: DC | PRN

## 2017-07-12 MED ORDER — METHOCARBAMOL 500 MG PO TABS: 500.0000 mg | ORAL_TABLET | Freq: Three times a day (TID) | ORAL | 0 refills | Status: DC

## 2017-07-12 NOTE — Assessment & Plan Note (Signed)
Has been able to decrease use to 0-1 at bedtime on higher dose gabapentin.  Follow up in 3 months.  # 60 sent to pharmacy to fill after 07/24/2017

## 2017-07-12 NOTE — Assessment & Plan Note (Signed)
ICe, stretching and change to robaxin. Stop flexeril.

## 2017-07-12 NOTE — Assessment & Plan Note (Signed)
Improved control on gabapentin  Dose.

## 2017-07-12 NOTE — Patient Instructions (Addendum)
Schedule yearly eye exam when feeling better.  Continue to wean off oxycodone as much as able.  Continue muscle stretching of upper and loiw back.  Stop flexeril and try new muscle relaxant.

## 2017-07-12 NOTE — Progress Notes (Signed)
Subjective:    Patient ID: Mallory GageKathleen L Vandeusen, female    DOB: Feb 03, 1961, 57 y.o.   MRN: [id number]  HPI    57 year old female presents for DM follow up.   She has been vomiting in last week  Dr. Jordan LikesSchmitz on 3/5 Dx viral GE. She has been able to keep down liquids.  No longer with emesis ( last on 3/5) , no abdominal pain. No fever.  Last BM small last night.  Pulled muscle in low back with emesis.Marland Kitchen.Marland Kitchen. Is causing back spasm.. Flexeril is not helping. Oxycidine does not help.  Has been stretching and heat.   Diabetes:   Improving control on  metformin.. Not taking this week given vital GE. Lab Results  Component Value Date   HGBA1C 6.9 (H) 07/02/2017  Using medications without difficulties: Hypoglycemic episodes:none Hyperglycemic episodes: none Feet problems: no uclers Blood Sugars averaging: FBS 110-125 eye exam within last year: due   Wt Readings from Last 3 Encounters:  07/12/17 172 lb (78 kg)  07/09/17 178 lb (80.7 kg)  05/17/17 178 lb 8 oz (81 kg)    Also here to follow up chronic pain management. At last OV she began to try to wean off oxycodone by increasing gabapentin dose. She reports some improvement in pain on higher dose of gabapentin. Using oxycodone using 1 tabs at bedtime, occ not using.   Review of Systems  Constitutional: Negative for fatigue and fever.  HENT: Negative for congestion.   Eyes: Negative for pain.  Respiratory: Negative for cough and shortness of breath.   Cardiovascular: Negative for chest pain, palpitations and leg swelling.  Gastrointestinal: Negative for abdominal pain.  Genitourinary: Negative for dysuria and vaginal bleeding.  Musculoskeletal: Negative for back pain.  Neurological: Negative for syncope, light-headedness and headaches.  Psychiatric/Behavioral: Negative for dysphoric mood.       Objective:   Physical Exam  Constitutional: Vital signs are normal. She appears well-developed and well-nourished. She is cooperative.   Non-toxic appearance. She does not appear ill. No distress.  HENT:  Head: Normocephalic.  Right Ear: Hearing, tympanic membrane, external ear and ear canal normal. Tympanic membrane is not erythematous, not retracted and not bulging.  Left Ear: Hearing, tympanic membrane, external ear and ear canal normal. Tympanic membrane is not erythematous, not retracted and not bulging.  Nose: No mucosal edema or rhinorrhea. Right sinus exhibits no maxillary sinus tenderness and no frontal sinus tenderness. Left sinus exhibits no maxillary sinus tenderness and no frontal sinus tenderness.  Mouth/Throat: Uvula is midline, oropharynx is clear and moist and mucous membranes are normal.  Eyes: Conjunctivae, EOM and lids are normal. Pupils are equal, round, and reactive to light. Lids are everted and swept, no foreign bodies found.  Neck: Trachea normal and normal range of motion. Neck supple. Carotid bruit is not present. No thyroid mass and no thyromegaly present.  Cardiovascular: Normal rate, regular rhythm, S1 normal, S2 normal, normal heart sounds, intact distal pulses and normal pulses. Exam reveals no gallop and no friction rub.  No murmur heard. Pulmonary/Chest: Effort normal and breath sounds normal. No tachypnea. No respiratory distress. She has no decreased breath sounds. She has no wheezes. She has no rhonchi. She has no rales.  Abdominal: Soft. Normal appearance and bowel sounds are normal. There is no tenderness.  Musculoskeletal:       Cervical back: She exhibits tenderness. She exhibits no bony tenderness.       Thoracic back: She exhibits tenderness.  Lumbar back: She exhibits tenderness. She exhibits no bony tenderness.  ttp upper and lower back over paraspinous muscles  Neurological: She is alert.  Skin: Skin is warm, dry and intact. No rash noted.  Psychiatric: Her speech is normal and behavior is normal. Judgment and thought content normal. Her mood appears not anxious. Cognition and  memory are normal. She does not exhibit a depressed mood.          Assessment & Plan:

## 2017-07-12 NOTE — Assessment & Plan Note (Signed)
Improving control on current regimen.

## 2017-07-12 NOTE — Assessment & Plan Note (Signed)
Symptomatic care 

## 2017-08-02 ENCOUNTER — Ambulatory Visit: Payer: Managed Care, Other (non HMO) | Admitting: Family Medicine

## 2017-08-14 ENCOUNTER — Other Ambulatory Visit: Payer: Self-pay | Admitting: Family Medicine

## 2017-09-04 ENCOUNTER — Ambulatory Visit: Payer: Managed Care, Other (non HMO) | Admitting: Family Medicine

## 2017-09-04 ENCOUNTER — Other Ambulatory Visit: Payer: Self-pay

## 2017-09-04 ENCOUNTER — Ambulatory Visit (INDEPENDENT_AMBULATORY_CARE_PROVIDER_SITE_OTHER)
Admission: RE | Admit: 2017-09-04 | Discharge: 2017-09-04 | Disposition: A | Discharge status: Home / Self Care | Payer: Managed Care, Other (non HMO) | Source: Ambulatory Visit | Attending: Family Medicine | Admitting: Family Medicine

## 2017-09-04 ENCOUNTER — Encounter: Payer: Self-pay | Admitting: Family Medicine

## 2017-09-04 VITALS — BP 140/86 | HR 85 | Temp 98.9°F | Ht 68.25 in | Wt 178.8 lb

## 2017-09-04 DIAGNOSIS — M542 Cervicalgia: Secondary | ICD-10-CM

## 2017-09-04 DIAGNOSIS — M47812 Spondylosis without myelopathy or radiculopathy, cervical region: Secondary | ICD-10-CM

## 2017-09-04 DIAGNOSIS — M7581 Other shoulder lesions, right shoulder: Secondary | ICD-10-CM | POA: Diagnosis not present

## 2017-09-04 DIAGNOSIS — M5030 Other cervical disc degeneration, unspecified cervical region: Secondary | ICD-10-CM | POA: Diagnosis not present

## 2017-09-04 MED ORDER — CYCLOBENZAPRINE HCL 10 MG PO TABS: 5.0000 mg | ORAL_TABLET | Freq: Three times a day (TID) | ORAL | 0 refills | Status: DC | PRN

## 2017-09-04 MED ORDER — PREDNISONE 20 MG PO TABS: ORAL_TABLET | ORAL | 0 refills | Status: DC

## 2017-09-04 NOTE — Progress Notes (Signed)
Dr. Karleen Hampshire T. Anne Boltz, MD, CAQ Sports [facility] Sports [facility] [address] [place], [address] Phone: [phone] Fax: [phone]  09/04/2017  Patient: Mallory Stewart, MRN: [id number], DOB: 1960/08/21, 57 y.o.  Primary Physician:  Excell Seltzer, MD   Chief Complaint  Patient presents with  . Shoulder Pain    Right   Subjective:   Mallory Stewart is a 57 y.o. very pleasant female patient who presents with the following:  R shoulder and no known injury and driving to [place] almost every week. Carrying briefcase and some and going down the back of the neck and only in the upper arm area. Holds arm across body.  She also has some significant pain with movement at the neck, and pain in virtually all directions of movement at the neck.  She denies any numbness or tingling as well as radicular symptoms down her arm.  The pain in the right arm is in a T-shirt distribution, and she also has pain primarily with abduction and holding things in abduction.  She also has some pain with terminal internal range of motion.  She has not had any kind of trauma.  RHD. Messing up sleeping  Past Medical History, Surgical History, Social History, Family History, Problem List, Medications, and Allergies have been reviewed and updated if relevant.  Patient Active Problem List   Diagnosis Date Noted  . Viral gastroenteritis 07/12/2017  . Muscle spasm of back 07/12/2017  . Chronic pain 05/17/2017  . Osteoarthritis of spine with radiculopathy, cervical region 09/17/2016  . Narcotic dependency, continuous (HCC) 09/17/2016  . Back pain 07/26/2016  . B12 deficiency 10/21/2014  . Neuropathic pain of both legs 02/11/2014  . Pancreatic pseudocyst/cyst 05/06/2013  . Fatty liver 04/27/2013  . Non-intractable vomiting with nausea 04/06/2011  . Elevated liver enzymes 02/05/2011  . EXCESSIVE OR FREQUENT MENSTRUATION 05/10/2009  . Diabetes mellitus without complication (HCC)  11/03/2008  . HYPERCHOLESTEROLEMIA 11/03/2008  . Essential hypertension, benign 09/29/2008  . PAP SMEAR, ABNORMAL 09/29/2008    Past Medical History:  Diagnosis Date  . Diabetes (HCC)   . Hypertension   . Pancreatitis     Past Surgical History:  Procedure Laterality Date  . CHOLECYSTECTOMY N/A 07/28/2016   Procedure: LAPAROSCOPIC CHOLECYSTECTOMY WITH INTRAOPERATIVE CHOLANGIOGRAM;  Surgeon: Rodman Pickle, MD;  Location: [facility] OR;  Service: General;  Laterality: N/A;  . LEEP  1990's  . ORIF FOOT FRACTURE  09/2008   L 5th metatarsal  . REFRACTIVE SURGERY  2000  . TONSILLECTOMY      Social History   Socioeconomic History  . Marital status: Married    Spouse name: Not on file  . Number of children: Not on file  . Years of education: Not on file  . Highest education level: Not on file  Occupational History  . 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 Financial resource strain: Not on file  . Food insecurity:    Worry: Not on file    Inability: Not on file  . Transportation needs:    Medical: Not on file    Non-medical: Not on file  Tobacco Use  . Smoking status: Never Smoker  . Smokeless tobacco: Never Used  Substance and Sexual Activity  . Alcohol use: Yes    Alcohol/week: 4.2 oz    Types: 7 Glasses of wine per week    Comment: wine  . Drug use: No  . Sexual activity: Not on file  Lifestyle  . Physical activity:    Days per week: Not on file    Minutes per session: Not on file  . Stress: Not on file  Relationships  . Social connections:    Talks on phone: Not on file    Gets together: Not on file    Attends religious service: Not on file    Active member of club or organization: Not on file    Attends meetings of clubs or organizations: Not on file    Relationship status: Not on file  . Intimate partner violence:    Fear of current or ex partner: Not on file    Emotionally abused: Not on file    Physically abused: Not on file     Forced sexual activity: Not on file  Other Topics Concern  . Not on file  Social History Narrative   Regular exercise , walking 4-5 times a week   Diet fruits and veggies, water    Family History  Problem Relation Age of Onset  . Other Mother        tachycardia.Marland KitchenMarland Kitchen?afib  . Stroke Father        after hernia suegery  . Prostate cancer Father   . Other Father        global transient amnesia, unclear source  . Atrial fibrillation Sister   . Healthy Brother   . Healthy Brother   . Coronary artery disease Paternal Grandmother   . Heart attack Paternal Grandmother 70  . Brain cancer Maternal Grandfather        ?  Marland Kitchen Cancer Paternal Grandfather        ?  Marland Kitchen Breast cancer Maternal Grandmother     Allergies  Allergen Reactions  . Simvastatin     Elevated lfts?    Medication list reviewed and updated in full in [facility].  GEN: No fevers, chills. Nontoxic. Primarily MSK c/o today. MSK: Detailed in the HPI GI: tolerating PO intake without difficulty Neuro: No numbness, parasthesias, or tingling associated. Otherwise the pertinent positives of the ROS are noted above.   Objective:   BP 140/86   Pulse 85   Temp 98.9 F (37.2 C) (Oral)   Ht 5' 8.25" (1.734 m)   Wt 178 lb 12 oz (81.1 kg)   BMI 26.98 kg/m    GEN: Well-developed,well-nourished,in no acute distress; alert,appropriate and cooperative throughout examination HEENT: Normocephalic and atraumatic without obvious abnormalities. Ears, externally no deformities PULM: Breathing comfortably in no respiratory distress EXT: No clubbing, cyanosis, or edema PSYCH: Normally interactive. Cooperative during the interview. Pleasant. Friendly and conversant. Not anxious or depressed appearing. Normal, full affect.  CERVICAL SPINE EXAM Range of motion: Flexion, extension, lateral bending, and rotation: The patient has at least a 40% loss of motion in all directions. Pain with terminal motion: Pain with terminal motion in  all directions. Spurlings causes local pain only Spinous Processes: NT SCM: NT Upper paracervical muscles: y, relatively diffuse Upper traps: NT C5-T1 intact, sensation and motor   Shoulder: R Inspection: No muscle wasting or winging Ecchymosis/edema: neg  AC joint, scapula, clavicle: NT Abduction: full, 5/5 Flexion: full, 5/5 IR, full, lift-off: 5/5 ER at neutral: full, 5/5 AC crossover: neg Neer: pos Hawkins: pos Drop Test: neg Empty Can: pos Supraspinatus insertion: mild-mod T Bicipital groove: NT Speed's: neg Yergason's: neg Sulcus sign: neg Scapular dyskinesis: none C5-T1 intact  Neuro: Sensation intact Grip 5/5   Radiology: Dg Cervical Spine Complete  Result Date: 09/04/2017 CLINICAL DATA:  Neck pain with restricted motion. EXAM: CERVICAL SPINE - COMPLETE 4+ VIEW COMPARISON:  None. FINDINGS: Moderate disc flattening C4-5, C5-6 and C6-7 with small anterior osteophytes. Straightening of the cervical lordosis. No jumped or perched appearing facets. Uncinate spurring secondary to osteoarthritis of the uncovertebral joints at C4-5 bilaterally, on the right at C5-6 and to a lesser degree bilaterally at C6-7. Associated foraminal stenosis secondary to uncinate spurring at these levels. Lung apices are clear. IMPRESSION: Cervical spondylosis with degenerative disc disease C4 through C7 with associated uncovertebral joint osteoarthritis and uncinate spurring as above. No acute fracture or listhesis. Electronically Signed   By: Tollie Eth M.D.   On: 09/04/2017 23:45     Assessment and Plan:   DDD (degenerative disc disease), cervical  Cervicalgia - Plan: DG Cervical Spine Complete, Ambulatory referral to Physical Therapy  Rotator cuff tendonitis, right - Plan: Ambulatory referral to Physical Therapy  Cervical arthritis   I think that the primary issue here is her degenerative disc disease and spondyloarthropathy of the neck.  This is not something that developed rapidly,  but now this is her primary pain point, she has pain in fairly notable loss of motion.  She also has some rotator cuff tendinitis on the right, and I suspect that some of her recent travels and prolonged car exposure has caused this as well as inflamed her neck.  She is hurting in multiple locations, and I am going to give her some oral steroids to see if this will help calm down both her neck and her shoulder and start her on some physical therapy.  Follow-up: 6 weeks  Meds ordered this encounter  Medications  . cyclobenzaprine (FLEXERIL) 10 MG tablet    Sig: Take 0.5-1 tablets (5-10 mg total) by mouth 3 (three) times daily as needed for muscle spasms.    Dispense:  30 tablet    Refill:  0  . predniSONE (DELTASONE) 20 MG tablet    Sig: 2 tabs po for 5 days, then 1 tab po for 5 days    Dispense:  15 tablet    Refill:  0   Orders Placed This Encounter  Procedures  . DG Cervical Spine Complete  . Ambulatory referral to Physical Therapy    Signed,  Karleen Hampshire T. Qunisha Bryk, MD   Allergies as of 09/04/2017      Reactions   Simvastatin    Elevated lfts?      Medication List        Accurate as of 09/04/17 11:59 PM. Always use your most recent med list.          cyclobenzaprine 10 MG tablet Commonly known as:  FLEXERIL Take 0.5-1 tablets (5-10 mg total) by mouth 3 (three) times daily as needed for muscle spasms.   gabapentin 300 MG capsule Commonly known as:  NEURONTIN Take 900 mg by mouth at bedtime.   lisinopril 40 MG tablet Commonly known as:  PRINIVIL,ZESTRIL TAKE 1 TABLET BY MOUTH  DAILY   Melatonin 10 MG Caps Take 10 mg by mouth at bedtime.   metFORMIN 500 MG 24 hr tablet Commonly known as:  GLUCOPHAGE-XR TAKE 2 TABLETS BY MOUTH IN  THE MORNING AND 1 TABLET AT BEDTIME   metoprolol tartrate 50 MG tablet Commonly known as:  LOPRESSOR TAKE 1 TABLET BY MOUTH TWICE A DAY   ONE TOUCH ULTRA TEST test strip Generic drug:  glucose blood CHECK BLOOD SUGAR TWO TIMES  DAILY AS DIRECTED   oxyCODONE-acetaminophen 5-325 MG tablet Commonly  known as:  PERCOCET/ROXICET Take 1-2 tablets by mouth at bedtime as needed for severe pain. Fill  on or after 07/24/2017   predniSONE 20 MG tablet Commonly known as:  DELTASONE 2 tabs po for 5 days, then 1 tab po for 5 days

## 2017-09-04 NOTE — Patient Instructions (Signed)

## 2017-09-06 ENCOUNTER — Telehealth: Payer: Self-pay | Admitting: Family Medicine

## 2017-09-06 NOTE — Telephone Encounter (Signed)
Copied from CRM [id number]. Topic: Quick Communication - Other Results >> Sep 05, 2017  4:10 PM Damita Lack, CMA wrote: Called patient to inform them of x-ray results. When patient returns call, triage nurse may disclose results.

## 2017-09-06 NOTE — Telephone Encounter (Signed)
Pt. notified of results of xray per result note of 09/06/17 @ 10:41 AM

## 2017-10-11 ENCOUNTER — Ambulatory Visit: Payer: Managed Care, Other (non HMO) | Admitting: Family Medicine

## 2017-10-25 ENCOUNTER — Ambulatory Visit: Payer: Managed Care, Other (non HMO) | Admitting: Family Medicine

## 2017-11-13 ENCOUNTER — Encounter: Payer: Self-pay | Admitting: *Deleted

## 2017-11-14 ENCOUNTER — Ambulatory Visit: Payer: Managed Care, Other (non HMO) | Admitting: Family Medicine

## 2017-11-22 ENCOUNTER — Ambulatory Visit: Payer: Managed Care, Other (non HMO) | Admitting: Family Medicine

## 2017-11-22 DIAGNOSIS — Z0289 Encounter for other administrative examinations: Secondary | ICD-10-CM

## 2017-12-13 ENCOUNTER — Other Ambulatory Visit: Payer: Self-pay | Admitting: Family Medicine

## 2017-12-13 DIAGNOSIS — Z1231 Encounter for screening mammogram for malignant neoplasm of breast: Secondary | ICD-10-CM

## 2017-12-26 ENCOUNTER — Ambulatory Visit (INDEPENDENT_AMBULATORY_CARE_PROVIDER_SITE_OTHER): Payer: Managed Care, Other (non HMO) | Admitting: Family Medicine

## 2017-12-26 ENCOUNTER — Encounter: Payer: Self-pay | Admitting: Family Medicine

## 2017-12-26 VITALS — BP 110/70 | HR 77 | Temp 98.3°F | Ht 68.25 in | Wt 173.8 lb

## 2017-12-26 DIAGNOSIS — E119 Type 2 diabetes mellitus without complications: Secondary | ICD-10-CM

## 2017-12-26 DIAGNOSIS — G8929 Other chronic pain: Secondary | ICD-10-CM

## 2017-12-26 DIAGNOSIS — E7800 Pure hypercholesterolemia, unspecified: Secondary | ICD-10-CM

## 2017-12-26 DIAGNOSIS — G5793 Unspecified mononeuropathy of bilateral lower limbs: Secondary | ICD-10-CM

## 2017-12-26 DIAGNOSIS — R42 Dizziness and giddiness: Secondary | ICD-10-CM | POA: Insufficient documentation

## 2017-12-26 DIAGNOSIS — I10 Essential (primary) hypertension: Secondary | ICD-10-CM | POA: Diagnosis not present

## 2017-12-26 LAB — HM DIABETES FOOT EXAM

## 2017-12-26 MED ORDER — LISINOPRIL 40 MG PO TABS: 40.0000 mg | ORAL_TABLET | Freq: Every day | ORAL | 3 refills | Status: DC

## 2017-12-26 MED ORDER — GABAPENTIN 300 MG PO CAPS: 900.0000 mg | ORAL_CAPSULE | Freq: Every day | ORAL | 2 refills | Status: DC

## 2017-12-26 NOTE — Addendum Note (Signed)
Addended by: Alvina ChouWALSH, TERRI J on: 12/26/2017 03:15 PM   Modules accepted: Orders

## 2017-12-26 NOTE — Assessment & Plan Note (Signed)
Well controlled. Continue current medication.  

## 2017-12-26 NOTE — Progress Notes (Signed)
Subjective:    Patient ID: Mallory GageKathleen L Stewart, female    DOB: April 15, 1961, 57 y.o.   MRN: [id number]  HPI  57 year old female pt presents for pain med management and DM follow up.  She has had some recent vertigo, in last month.. When move, turning head triggers head spinning. Ears feel full. She has been swimming a lot and feels that she cannot get water out of ears. No ear pain, no fever. Vertigo not clearly associated with the below nausea she has been having. No syncope or presyncope. Balance is decreased... Has bene some like that since the concussion 2 years, but worse.  Diabetes:   Due for re-eval.  She has been having a lot of nausea and has been eating BRAT diet. Has skipped some doses given metformin make stomach hurt worse sometimes. Lab Results  Component Value Date   HGBA1C 6.9 (H) 07/02/2017  Using medications without difficulties: Hypoglycemic episodes:none Hyperglycemic episodes: none Feet problems: Blood Sugars averaging: FBS 110-115 eye exam within last year:  Hypertension:    Good control on lisinopril BP Readings from Last 3 Encounters:  12/26/17 110/70  09/04/17 140/86  07/12/17 138/90  Using medication without problems or lightheadedness:  none Chest pain with exertion:none Edema:none Short of breath:none Average home BPs: Other issues:   Indication for chronic opioid:  peripheral neuropathy Medication and dose: She has only used oxycodone   At night if wakes up at night and cannot sleep with pain.  She has not used in the last 2 weeks. She has been using CBD oil at night # pills per month:  < 30 Last UDS date: Due last 09/19/2016  No UDS of or contract needed as she has stopped the med!!   Blood pressure 110/70, pulse 77, temperature 98.3 F (36.8 C), temperature source Oral, height 5' 8.25" (1.734 m), weight 173 lb 12 oz (78.8 kg). Social History /Family History/Past Medical History reviewed in detail and updated in EMR if needed.  Review of  Systems  Constitutional: Negative for fatigue and fever.  HENT: Negative for congestion.   Eyes: Negative for pain.  Respiratory: Negative for cough and shortness of breath.   Cardiovascular: Negative for chest pain, palpitations and leg swelling.  Gastrointestinal: Negative for abdominal pain.  Genitourinary: Negative for dysuria and vaginal bleeding.  Musculoskeletal: Negative for back pain.  Neurological: Negative for syncope, light-headedness and headaches.  Psychiatric/Behavioral: Negative for dysphoric mood.       Objective:   Physical Exam  Constitutional: Vital signs are normal. She appears well-developed and well-nourished. She is cooperative.  Non-toxic appearance. She does not appear ill. No distress.  HENT:  Head: Normocephalic.  Right Ear: Hearing, tympanic membrane, external ear and ear canal normal. Tympanic membrane is not erythematous, not retracted and not bulging.  Left Ear: Hearing, tympanic membrane, external ear and ear canal normal. Tympanic membrane is not erythematous, not retracted and not bulging.  Nose: No mucosal edema or rhinorrhea. Right sinus exhibits no maxillary sinus tenderness and no frontal sinus tenderness. Left sinus exhibits no maxillary sinus tenderness and no frontal sinus tenderness.  Mouth/Throat: Uvula is midline, oropharynx is clear and moist and mucous membranes are normal.  Eyes: Pupils are equal, round, and reactive to light. Conjunctivae, EOM and lids are normal. Lids are everted and swept, no foreign bodies found.  Neck: Trachea normal and normal range of motion. Neck supple. Carotid bruit is not present. No thyroid mass and no thyromegaly present.  Cardiovascular: Normal rate,  regular rhythm, S1 normal, S2 normal, normal heart sounds, intact distal pulses and normal pulses. Exam reveals no gallop and no friction rub.  No murmur heard. Pulmonary/Chest: Effort normal and breath sounds normal. No tachypnea. No respiratory distress. She has  no decreased breath sounds. She has no wheezes. She has no rhonchi. She has no rales.  Abdominal: Soft. Normal appearance and bowel sounds are normal. There is no tenderness.  Neurological: She is alert.  Skin: Skin is warm, dry and intact. No rash noted.  Psychiatric: Her speech is normal and behavior is normal. Judgment and thought content normal. Her mood appears not anxious. Cognition and memory are normal. She does not exhibit a depressed mood.    Diabetic foot exam: Normal inspection No skin breakdown No calluses  Normal DP pulses Normal sensation to light touch and monofilament Nails normal       Assessment & Plan:

## 2017-12-26 NOTE — Patient Instructions (Addendum)
Please stop at the lab to have labs drawn.  Set up yearly diabetes eye exam.  Flonase 2 sprays per nostril daily. Look into CBD oil as cause of nausea.  Increase gabapentin to 4 capsule at bedtime.  Stop oxycodone, but you can use what you have if severe breakthrough pain. Plan on no further prescription of oxycodone given your desire to weAN OFF.  Call if issues!

## 2017-12-26 NOTE — Assessment & Plan Note (Signed)
Increase gabapentin to 1200 mg at bedtime. Continue to try to stay off oxycocodne, but can use what you have for break through pain, pt plans to stay off.  If further prescription required .Marland Kitchen. We will need UDS and  Contract.

## 2017-12-26 NOTE — Assessment & Plan Note (Signed)
Most consistent with fluids behind Bilateral TMs vs BPPV.. Treat with nasal steroid.

## 2017-12-30 ENCOUNTER — Other Ambulatory Visit (INDEPENDENT_AMBULATORY_CARE_PROVIDER_SITE_OTHER): Payer: Managed Care, Other (non HMO)

## 2017-12-30 DIAGNOSIS — I10 Essential (primary) hypertension: Secondary | ICD-10-CM | POA: Diagnosis not present

## 2017-12-30 DIAGNOSIS — E7800 Pure hypercholesterolemia, unspecified: Secondary | ICD-10-CM | POA: Diagnosis not present

## 2017-12-30 DIAGNOSIS — E119 Type 2 diabetes mellitus without complications: Secondary | ICD-10-CM | POA: Diagnosis not present

## 2017-12-30 LAB — COMPREHENSIVE METABOLIC PANEL
ALK PHOS: 86 U/L (ref 39–117)
ALT: 40 U/L — AB (ref 0–35)
AST: 46 U/L — AB (ref 0–37)
Albumin: 4.1 g/dL (ref 3.5–5.2)
BILIRUBIN TOTAL: 0.9 mg/dL (ref 0.2–1.2)
BUN: 7 mg/dL (ref 6–23)
CO2: 29 mEq/L (ref 19–32)
CREATININE: 0.84 mg/dL (ref 0.40–1.20)
Calcium: 9.7 mg/dL (ref 8.4–10.5)
Chloride: 99 mEq/L (ref 96–112)
GFR: 74.83 mL/min (ref >60.00)
GLUCOSE: 231 mg/dL — AB (ref 70–99)
Potassium: 4.6 mEq/L (ref 3.5–5.1)
Sodium: 139 mEq/L (ref 135–145)
TOTAL PROTEIN: 6.7 g/dL (ref 6.0–8.3)

## 2017-12-30 LAB — LIPID PANEL
CHOL/HDL RATIO: 3
Cholesterol: 224 mg/dL — ABNORMAL HIGH (ref 0–200)
HDL: 71.1 mg/dL (ref >39.00)
LDL Cholesterol: 121 mg/dL — ABNORMAL HIGH (ref 0–99)
NonHDL: 152.81
Triglycerides: 157 mg/dL — ABNORMAL HIGH (ref 0.0–149.0)
VLDL: 31.4 mg/dL (ref 0.0–40.0)

## 2017-12-30 LAB — HEMOGLOBIN A1C: HEMOGLOBIN A1C: 9.2 % — AB (ref 4.6–6.5)

## 2018-01-10 ENCOUNTER — Ambulatory Visit
Admission: RE | Admit: 2018-01-10 | Discharge: 2018-01-10 | Disposition: A | Discharge status: Home / Self Care | Payer: Managed Care, Other (non HMO) | Source: Ambulatory Visit | Attending: Family Medicine | Admitting: Family Medicine

## 2018-01-10 ENCOUNTER — Other Ambulatory Visit: Payer: Self-pay | Admitting: *Deleted

## 2018-01-10 ENCOUNTER — Other Ambulatory Visit: Payer: Self-pay | Admitting: Family Medicine

## 2018-01-10 DIAGNOSIS — Z1231 Encounter for screening mammogram for malignant neoplasm of breast: Secondary | ICD-10-CM

## 2018-01-10 MED ORDER — METFORMIN HCL ER 500 MG PO TB24: ORAL_TABLET | ORAL | 3 refills | Status: DC

## 2018-04-01 ENCOUNTER — Other Ambulatory Visit: Payer: Self-pay | Admitting: Family Medicine

## 2018-04-01 NOTE — Telephone Encounter (Signed)
Last office visit 12/26/2017 for Neuropathic Pain.  Last refilled 12/26/2017 for #90 with 2 refills.  CPE scheduled for 05/30/2018.

## 2018-04-25 ENCOUNTER — Telehealth: Payer: Self-pay | Admitting: Family Medicine

## 2018-04-25 NOTE — Telephone Encounter (Signed)
Left message to call office

## 2018-04-25 NOTE — Telephone Encounter (Signed)
Pt need call back from nurse to discuss how she is suppose to be taking her Gabapentin. Please call pt.

## 2018-04-28 MED ORDER — GABAPENTIN 300 MG PO CAPS: 1200.0000 mg | ORAL_CAPSULE | Freq: Every day | ORAL | 2 refills | Status: DC

## 2018-04-28 NOTE — Telephone Encounter (Signed)
She is out of gabapentin. It needs to be increasing to 4 caps at bedtime. She is not on oxycodone. [facility]. She will be leaving around 1pm today to travel for the holidays.

## 2018-04-28 NOTE — Telephone Encounter (Signed)
[facility] [place], [place] They are not able to have rx transferred as it is a new Rx. Verbal given for #120 and the remaining refills will be available at [facility], when due.

## 2018-04-28 NOTE — Telephone Encounter (Signed)
Let pt know [facility] 4 tabs at bedtime.

## 2018-04-28 NOTE — Telephone Encounter (Signed)
Patient notified by telephone and verbalized understanding. Patient stated that she is on her way out of town and would like the script sent to a [facility] [place]. Advised patient that she should be able to get the [facility] [place] to contact the local pharmacy and have it transferred and she verbalized understanding.

## 2018-04-28 NOTE — Telephone Encounter (Signed)
Pt returning your call. Please call pt. °

## 2018-05-22 ENCOUNTER — Telehealth: Payer: Self-pay | Admitting: Family Medicine

## 2018-05-22 DIAGNOSIS — E538 Deficiency of other specified B group vitamins: Secondary | ICD-10-CM

## 2018-05-22 DIAGNOSIS — E119 Type 2 diabetes mellitus without complications: Secondary | ICD-10-CM

## 2018-05-22 NOTE — Telephone Encounter (Signed)
-----   Message from Alvina Chou sent at 05/12/2018 10:47 AM EST ----- Regarding: Lab orders for Friday, 1.17.20 Patient is scheduled for CPX labs, please order future labs, Thanks , Camelia Eng

## 2018-05-23 ENCOUNTER — Other Ambulatory Visit: Payer: Managed Care, Other (non HMO)

## 2018-05-30 ENCOUNTER — Encounter: Payer: Managed Care, Other (non HMO) | Admitting: Family Medicine

## 2018-05-31 ENCOUNTER — Other Ambulatory Visit: Payer: Self-pay | Admitting: Family Medicine

## 2018-07-21 ENCOUNTER — Emergency Department (HOSPITAL_COMMUNITY): Payer: 59

## 2018-07-21 ENCOUNTER — Other Ambulatory Visit: Payer: Self-pay

## 2018-07-21 ENCOUNTER — Inpatient Hospital Stay (HOSPITAL_COMMUNITY): Payer: 59

## 2018-07-21 ENCOUNTER — Inpatient Hospital Stay (HOSPITAL_COMMUNITY)
Admission: EM | Admit: 2018-07-21 | Discharge: 2018-07-24 | DRG: 637 | Disposition: A | Discharge status: Home / Self Care | Payer: 59 | Attending: Internal Medicine | Admitting: Internal Medicine

## 2018-07-21 ENCOUNTER — Encounter (HOSPITAL_COMMUNITY): Payer: Self-pay | Admitting: Emergency Medicine

## 2018-07-21 DIAGNOSIS — E7800 Pure hypercholesterolemia, unspecified: Secondary | ICD-10-CM | POA: Diagnosis present

## 2018-07-21 DIAGNOSIS — K311 Adult hypertrophic pyloric stenosis: Secondary | ICD-10-CM | POA: Diagnosis present

## 2018-07-21 DIAGNOSIS — Z7984 Long term (current) use of oral hypoglycemic drugs: Secondary | ICD-10-CM

## 2018-07-21 DIAGNOSIS — E1010 Type 1 diabetes mellitus with ketoacidosis without coma: Secondary | ICD-10-CM | POA: Diagnosis not present

## 2018-07-21 DIAGNOSIS — Z803 Family history of malignant neoplasm of breast: Secondary | ICD-10-CM

## 2018-07-21 DIAGNOSIS — I10 Essential (primary) hypertension: Secondary | ICD-10-CM | POA: Diagnosis present

## 2018-07-21 DIAGNOSIS — E861 Hypovolemia: Secondary | ICD-10-CM | POA: Diagnosis present

## 2018-07-21 DIAGNOSIS — Z808 Family history of malignant neoplasm of other organs or systems: Secondary | ICD-10-CM | POA: Diagnosis not present

## 2018-07-21 DIAGNOSIS — R109 Unspecified abdominal pain: Secondary | ICD-10-CM

## 2018-07-21 DIAGNOSIS — E876 Hypokalemia: Secondary | ICD-10-CM | POA: Diagnosis not present

## 2018-07-21 DIAGNOSIS — E872 Acidosis: Secondary | ICD-10-CM | POA: Diagnosis present

## 2018-07-21 DIAGNOSIS — Z8042 Family history of malignant neoplasm of prostate: Secondary | ICD-10-CM

## 2018-07-21 DIAGNOSIS — Z823 Family history of stroke: Secondary | ICD-10-CM

## 2018-07-21 DIAGNOSIS — Z8249 Family history of ischemic heart disease and other diseases of the circulatory system: Secondary | ICD-10-CM

## 2018-07-21 DIAGNOSIS — N179 Acute kidney failure, unspecified: Secondary | ICD-10-CM | POA: Diagnosis present

## 2018-07-21 DIAGNOSIS — Z888 Allergy status to other drugs, medicaments and biological substances status: Secondary | ICD-10-CM | POA: Diagnosis not present

## 2018-07-21 DIAGNOSIS — R948 Abnormal results of function studies of other organs and systems: Secondary | ICD-10-CM

## 2018-07-21 DIAGNOSIS — E1110 Type 2 diabetes mellitus with ketoacidosis without coma: Secondary | ICD-10-CM | POA: Diagnosis present

## 2018-07-21 DIAGNOSIS — Z4659 Encounter for fitting and adjustment of other gastrointestinal appliance and device: Secondary | ICD-10-CM | POA: Diagnosis not present

## 2018-07-21 DIAGNOSIS — R945 Abnormal results of liver function studies: Secondary | ICD-10-CM | POA: Diagnosis not present

## 2018-07-21 DIAGNOSIS — R112 Nausea with vomiting, unspecified: Secondary | ICD-10-CM | POA: Diagnosis not present

## 2018-07-21 DIAGNOSIS — K859 Acute pancreatitis without necrosis or infection, unspecified: Secondary | ICD-10-CM | POA: Diagnosis not present

## 2018-07-21 DIAGNOSIS — R748 Abnormal levels of other serum enzymes: Secondary | ICD-10-CM

## 2018-07-21 DIAGNOSIS — E43 Unspecified severe protein-calorie malnutrition: Secondary | ICD-10-CM | POA: Diagnosis present

## 2018-07-21 DIAGNOSIS — Z6822 Body mass index (BMI) 22.0-22.9, adult: Secondary | ICD-10-CM | POA: Diagnosis not present

## 2018-07-21 DIAGNOSIS — R9389 Abnormal findings on diagnostic imaging of other specified body structures: Secondary | ICD-10-CM

## 2018-07-21 DIAGNOSIS — R680 Hypothermia, not associated with low environmental temperature: Secondary | ICD-10-CM

## 2018-07-21 LAB — BASIC METABOLIC PANEL
Anion gap: 20 — ABNORMAL HIGH (ref 5–15)
Anion gap: 15 (ref 5–15)
Anion gap: 12 (ref 5–15)
Anion gap: 10 (ref 5–15)
BUN: 17 mg/dL (ref 6–20)
BUN: 15 mg/dL (ref 6–20)
BUN: 14 mg/dL (ref 6–20)
BUN: 14 mg/dL (ref 6–20)
BUN: 13 mg/dL (ref 6–20)
CALCIUM: 9.2 mg/dL (ref 8.9–10.3)
CALCIUM: 9 mg/dL (ref 8.9–10.3)
CALCIUM: 8.5 mg/dL — AB (ref 8.9–10.3)
CHLORIDE: 110 mmol/L (ref 98–111)
CHLORIDE: 110 mmol/L (ref 98–111)
CO2: <7 mmol/L — ABNORMAL LOW (ref 22–32)
CO2: 14 mmol/L — ABNORMAL LOW (ref 22–32)
CO2: 23 mmol/L (ref 22–32)
CO2: 25 mmol/L (ref 22–32)
CO2: 24 mmol/L (ref 22–32)
CREATININE: 0.98 mg/dL (ref 0.44–1.00)
Calcium: 8.4 mg/dL — ABNORMAL LOW (ref 8.9–10.3)
Calcium: 9.3 mg/dL (ref 8.9–10.3)
Chloride: 114 mmol/L — ABNORMAL HIGH (ref 98–111)
Chloride: 110 mmol/L (ref 98–111)
Chloride: 109 mmol/L (ref 98–111)
Creatinine, Ser: 1.73 mg/dL — ABNORMAL HIGH (ref 0.44–1.00)
Creatinine, Ser: 1.43 mg/dL — ABNORMAL HIGH (ref 0.44–1.00)
Creatinine, Ser: 1.07 mg/dL — ABNORMAL HIGH (ref 0.44–1.00)
Creatinine, Ser: 0.97 mg/dL (ref 0.44–1.00)
GFR calc Af Amer: 38 mL/min — ABNORMAL LOW (ref >60)
GFR calc Af Amer: 48 mL/min — ABNORMAL LOW (ref >60)
GFR calc Af Amer: >60 mL/min (ref >60)
GFR calc Af Amer: >60 mL/min (ref >60)
GFR calc Af Amer: >60 mL/min (ref >60)
GFR calc non Af Amer: 33 mL/min — ABNORMAL LOW (ref >60)
GFR calc non Af Amer: 41 mL/min — ABNORMAL LOW (ref >60)
GFR calc non Af Amer: 58 mL/min — ABNORMAL LOW (ref >60)
GFR calc non Af Amer: >60 mL/min (ref >60)
GFR calc non Af Amer: >60 mL/min (ref >60)
Glucose, Bld: 478 mg/dL — ABNORMAL HIGH (ref 70–99)
Glucose, Bld: 318 mg/dL — ABNORMAL HIGH (ref 70–99)
Glucose, Bld: 172 mg/dL — ABNORMAL HIGH (ref 70–99)
Glucose, Bld: 129 mg/dL — ABNORMAL HIGH (ref 70–99)
Glucose, Bld: 196 mg/dL — ABNORMAL HIGH (ref 70–99)
Potassium: 2.9 mmol/L — ABNORMAL LOW (ref 3.5–5.1)
Potassium: 3.2 mmol/L — ABNORMAL LOW (ref 3.5–5.1)
Potassium: 3.3 mmol/L — ABNORMAL LOW (ref 3.5–5.1)
Potassium: 3.3 mmol/L — ABNORMAL LOW (ref 3.5–5.1)
Potassium: 3.7 mmol/L (ref 3.5–5.1)
SODIUM: 148 mmol/L — AB (ref 135–145)
Sodium: 147 mmol/L — ABNORMAL HIGH (ref 135–145)
Sodium: 148 mmol/L — ABNORMAL HIGH (ref 135–145)
Sodium: 147 mmol/L — ABNORMAL HIGH (ref 135–145)
Sodium: 143 mmol/L (ref 135–145)

## 2018-07-21 LAB — COMPREHENSIVE METABOLIC PANEL
ALT: 210 U/L — ABNORMAL HIGH (ref 0–44)
AST: 90 U/L — ABNORMAL HIGH (ref 15–41)
Albumin: 3.9 g/dL (ref 3.5–5.0)
Alkaline Phosphatase: 1028 U/L — ABNORMAL HIGH (ref 38–126)
BUN: 18 mg/dL (ref 6–20)
CALCIUM: 9.9 mg/dL (ref 8.9–10.3)
CO2: <7 mmol/L — ABNORMAL LOW (ref 22–32)
Chloride: 102 mmol/L (ref 98–111)
Creatinine, Ser: 1.83 mg/dL — ABNORMAL HIGH (ref 0.44–1.00)
GFR calc Af Amer: 35 mL/min — ABNORMAL LOW (ref >60)
GFR calc non Af Amer: 31 mL/min — ABNORMAL LOW (ref >60)
GLUCOSE: 618 mg/dL — AB (ref 70–99)
Potassium: 3.9 mmol/L (ref 3.5–5.1)
Sodium: 143 mmol/L (ref 135–145)
Total Bilirubin: 2.7 mg/dL — ABNORMAL HIGH (ref 0.3–1.2)
Total Protein: 8 g/dL (ref 6.5–8.1)

## 2018-07-21 LAB — CBC WITH DIFFERENTIAL/PLATELET
Abs Immature Granulocytes: 0.46 10*3/uL — ABNORMAL HIGH (ref 0.00–0.07)
Basophils Absolute: 0.1 10*3/uL (ref 0.0–0.1)
Basophils Relative: 0 %
Eosinophils Absolute: 0 10*3/uL (ref 0.0–0.5)
Eosinophils Relative: 0 %
HCT: 46.4 % — ABNORMAL HIGH (ref 36.0–46.0)
Hemoglobin: 13.8 g/dL (ref 12.0–15.0)
Immature Granulocytes: 2 %
Lymphocytes Relative: 6 %
Lymphs Abs: 1.2 10*3/uL (ref 0.7–4.0)
MCH: 30 pg (ref 26.0–34.0)
MCHC: 29.7 g/dL — ABNORMAL LOW (ref 30.0–36.0)
MCV: 100.9 fL — AB (ref 80.0–100.0)
MONOS PCT: 3 %
Monocytes Absolute: 0.6 10*3/uL (ref 0.1–1.0)
Neutro Abs: 16.9 10*3/uL — ABNORMAL HIGH (ref 1.7–7.7)
Neutrophils Relative %: 89 %
Platelets: 340 10*3/uL (ref 150–400)
RBC: 4.6 MIL/uL (ref 3.87–5.11)
RDW: 12.1 % (ref 11.5–15.5)
WBC: 19.2 10*3/uL — ABNORMAL HIGH (ref 4.0–10.5)
nRBC: 0 % (ref 0.0–0.2)

## 2018-07-21 LAB — GLUCOSE, CAPILLARY
Glucose-Capillary: 348 mg/dL — ABNORMAL HIGH (ref 70–99)
Glucose-Capillary: 339 mg/dL — ABNORMAL HIGH (ref 70–99)
Glucose-Capillary: 289 mg/dL — ABNORMAL HIGH (ref 70–99)
Glucose-Capillary: 207 mg/dL — ABNORMAL HIGH (ref 70–99)
Glucose-Capillary: 182 mg/dL — ABNORMAL HIGH (ref 70–99)
Glucose-Capillary: 196 mg/dL — ABNORMAL HIGH (ref 70–99)
Glucose-Capillary: 157 mg/dL — ABNORMAL HIGH (ref 70–99)
Glucose-Capillary: 145 mg/dL — ABNORMAL HIGH (ref 70–99)
Glucose-Capillary: 132 mg/dL — ABNORMAL HIGH (ref 70–99)
Glucose-Capillary: 138 mg/dL — ABNORMAL HIGH (ref 70–99)
Glucose-Capillary: 123 mg/dL — ABNORMAL HIGH (ref 70–99)
Glucose-Capillary: 111 mg/dL — ABNORMAL HIGH (ref 70–99)
Glucose-Capillary: 137 mg/dL — ABNORMAL HIGH (ref 70–99)
Glucose-Capillary: 158 mg/dL — ABNORMAL HIGH (ref 70–99)
Glucose-Capillary: 181 mg/dL — ABNORMAL HIGH (ref 70–99)
Glucose-Capillary: 192 mg/dL — ABNORMAL HIGH (ref 70–99)
Glucose-Capillary: 169 mg/dL — ABNORMAL HIGH (ref 70–99)
Glucose-Capillary: 116 mg/dL — ABNORMAL HIGH (ref 70–99)

## 2018-07-21 LAB — POCT I-STAT 7, (LYTES, BLD GAS, ICA,H+H)
Acid-Base Excess: 5 mmol/L — ABNORMAL HIGH (ref 0.0–2.0)
Acid-base deficit: 24 mmol/L — ABNORMAL HIGH (ref 0.0–2.0)
Bicarbonate: 4.5 mmol/L — ABNORMAL LOW (ref 20.0–28.0)
Bicarbonate: 27.9 mmol/L (ref 20.0–28.0)
Calcium, Ion: 1.24 mmol/L (ref 1.15–1.40)
Calcium, Ion: 1.18 mmol/L (ref 1.15–1.40)
HCT: 42 % (ref 36.0–46.0)
HCT: 33 % — ABNORMAL LOW (ref 36.0–46.0)
Hemoglobin: 14.3 g/dL (ref 12.0–15.0)
Hemoglobin: 11.2 g/dL — ABNORMAL LOW (ref 12.0–15.0)
O2 Saturation: 74 %
O2 Saturation: 98 %
Patient temperature: 97.7
Potassium: 3.3 mmol/L — ABNORMAL LOW (ref 3.5–5.1)
Potassium: 3.1 mmol/L — ABNORMAL LOW (ref 3.5–5.1)
SODIUM: 140 mmol/L (ref 135–145)
Sodium: 149 mmol/L — ABNORMAL HIGH (ref 135–145)
TCO2: <5 mmol/L — ABNORMAL LOW (ref 22–32)
TCO2: 29 mmol/L (ref 22–32)
pCO2 arterial: 16.6 mmHg — CL (ref 32.0–48.0)
pCO2 arterial: 32.9 mmHg (ref 32.0–48.0)
pH, Arterial: 7.039 — CL (ref 7.350–7.450)
pH, Arterial: 7.534 — ABNORMAL HIGH (ref 7.350–7.450)
pO2, Arterial: 55 mmHg — ABNORMAL LOW (ref 83.0–108.0)
pO2, Arterial: 83 mmHg (ref 83.0–108.0)

## 2018-07-21 LAB — LIPASE, BLOOD: Lipase: 105 U/L — ABNORMAL HIGH (ref 11–51)

## 2018-07-21 LAB — MRSA PCR SCREENING: MRSA BY PCR: NEGATIVE

## 2018-07-21 LAB — BLOOD GAS, ARTERIAL
BICARBONATE: 3.6 mmol/L — AB (ref 20.0–28.0)
Drawn by: 330991
FIO2: 21
O2 Saturation: 98.9 %
Patient temperature: 98.6
pH, Arterial: 7.252 — ABNORMAL LOW (ref 7.350–7.450)
pO2, Arterial: 187 mmHg — ABNORMAL HIGH (ref 83.0–108.0)

## 2018-07-21 LAB — LACTIC ACID, PLASMA
LACTIC ACID, VENOUS: 4.2 mmol/L — AB (ref 0.5–1.9)
Lactic Acid, Venous: 4.7 mmol/L (ref 0.5–1.9)
Lactic Acid, Venous: 3.3 mmol/L (ref 0.5–1.9)
Lactic Acid, Venous: 2.4 mmol/L (ref 0.5–1.9)
Lactic Acid, Venous: 2.5 mmol/L (ref 0.5–1.9)

## 2018-07-21 LAB — CBG MONITORING, ED
GLUCOSE-CAPILLARY: 428 mg/dL — AB (ref 70–99)
Glucose-Capillary: 372 mg/dL — ABNORMAL HIGH (ref 70–99)
Glucose-Capillary: 532 mg/dL (ref 70–99)
Glucose-Capillary: 572 mg/dL (ref 70–99)

## 2018-07-21 LAB — BETA-HYDROXYBUTYRIC ACID: Beta-Hydroxybutyric Acid: >8 mmol/L — ABNORMAL HIGH (ref 0.05–0.27)

## 2018-07-21 LAB — TROPONIN I
Troponin I: 0.28 ng/mL (ref <0.03)
Troponin I: 0.28 ng/mL (ref <0.03)

## 2018-07-21 LAB — PROCALCITONIN: Procalcitonin: 0.23 ng/mL

## 2018-07-21 LAB — MAGNESIUM: Magnesium: 1.8 mg/dL (ref 1.7–2.4)

## 2018-07-21 LAB — I-STAT BETA HCG BLOOD, ED (MC, WL, AP ONLY): I-stat hCG, quantitative: <5 m[IU]/mL (ref <5)

## 2018-07-21 MED ORDER — STERILE WATER FOR INJECTION IV SOLN
INTRAVENOUS | Status: DC
  Administered 2018-07-21: 07:00:00 via INTRAVENOUS
  Filled 2018-07-21 (×2): qty 850

## 2018-07-21 MED ORDER — POTASSIUM CHLORIDE 10 MEQ/100ML IV SOLN
10.0000 meq | INTRAVENOUS | Status: AC
  Administered 2018-07-21 (×4): 10 meq via INTRAVENOUS
  Filled 2018-07-21 (×4): qty 100

## 2018-07-21 MED ORDER — INSULIN ASPART 100 UNIT/ML ~~LOC~~ SOLN
0.0000 [IU] | SUBCUTANEOUS | Status: DC
  Administered 2018-07-21: 2 [IU] via SUBCUTANEOUS
  Administered 2018-07-22: 5 [IU] via SUBCUTANEOUS
  Administered 2018-07-22 (×3): 3 [IU] via SUBCUTANEOUS
  Administered 2018-07-22: 5 [IU] via SUBCUTANEOUS
  Administered 2018-07-22: 3 [IU] via SUBCUTANEOUS
  Administered 2018-07-23: 2 [IU] via SUBCUTANEOUS

## 2018-07-21 MED ORDER — ONDANSETRON HCL 4 MG/2ML IJ SOLN
4.0000 mg | Freq: Once | INTRAMUSCULAR | Status: AC
  Administered 2018-07-21: 4 mg via INTRAVENOUS
  Filled 2018-07-21: qty 2

## 2018-07-21 MED ORDER — SODIUM BICARBONATE 8.4 % IV SOLN
INTRAVENOUS | Status: AC
  Filled 2018-07-21: qty 50

## 2018-07-21 MED ORDER — INSULIN GLARGINE 100 UNIT/ML ~~LOC~~ SOLN
12.0000 [IU] | Freq: Two times a day (BID) | SUBCUTANEOUS | Status: DC
  Administered 2018-07-21 – 2018-07-22 (×2): 12 [IU] via SUBCUTANEOUS
  Filled 2018-07-21 (×3): qty 0.12

## 2018-07-21 MED ORDER — PANTOPRAZOLE SODIUM 40 MG IV SOLR
40.0000 mg | Freq: Every day | INTRAVENOUS | Status: DC
  Administered 2018-07-21 – 2018-07-23 (×3): 40 mg via INTRAVENOUS
  Filled 2018-07-21 (×3): qty 40

## 2018-07-21 MED ORDER — INSULIN REGULAR(HUMAN) IN NACL 100-0.9 UT/100ML-% IV SOLN
INTRAVENOUS | Status: DC
  Administered 2018-07-21: 5.1 [IU]/h via INTRAVENOUS
  Filled 2018-07-21 (×2): qty 100

## 2018-07-21 MED ORDER — SODIUM CHLORIDE 0.9 % IV BOLUS
30.0000 mL/kg | Freq: Once | INTRAVENOUS | Status: AC
  Administered 2018-07-21: 2364 mL via INTRAVENOUS

## 2018-07-21 MED ORDER — METOPROLOL TARTRATE 5 MG/5ML IV SOLN
5.0000 mg | Freq: Four times a day (QID) | INTRAVENOUS | Status: DC
  Administered 2018-07-21 – 2018-07-23 (×9): 5 mg via INTRAVENOUS
  Filled 2018-07-21 (×8): qty 5

## 2018-07-21 MED ORDER — HEPARIN SODIUM (PORCINE) 5000 UNIT/ML IJ SOLN
5000.0000 [IU] | Freq: Three times a day (TID) | INTRAMUSCULAR | Status: DC
  Administered 2018-07-21 – 2018-07-24 (×10): 5000 [IU] via SUBCUTANEOUS
  Filled 2018-07-21 (×9): qty 1

## 2018-07-21 MED ORDER — HYDRALAZINE HCL 20 MG/ML IJ SOLN: 5.0000 mg | Freq: Four times a day (QID) | INTRAMUSCULAR | Status: DC | PRN

## 2018-07-21 MED ORDER — LACTATED RINGERS IV BOLUS
2000.0000 mL | Freq: Once | INTRAVENOUS | Status: AC
  Administered 2018-07-21: 2000 mL via INTRAVENOUS

## 2018-07-21 MED ORDER — METOPROLOL TARTRATE 5 MG/5ML IV SOLN
5.0000 mg | Freq: Once | INTRAVENOUS | Status: AC
  Administered 2018-07-21: 5 mg via INTRAVENOUS
  Filled 2018-07-21: qty 5

## 2018-07-21 MED ORDER — POTASSIUM CHLORIDE 10 MEQ/100ML IV SOLN
10.0000 meq | INTRAVENOUS | Status: AC
  Administered 2018-07-21 (×5): 10 meq via INTRAVENOUS
  Filled 2018-07-21 (×5): qty 100

## 2018-07-21 MED ORDER — BENZOCAINE 20 % MT AERO
INHALATION_SPRAY | Freq: Once | OROMUCOSAL | Status: AC
  Administered 2018-07-21: 05:00:00 via OROMUCOSAL
  Filled 2018-07-21: qty 57

## 2018-07-21 MED ORDER — DEXTROSE-NACL 5-0.45 % IV SOLN
INTRAVENOUS | Status: DC
  Administered 2018-07-21 – 2018-07-23 (×7): via INTRAVENOUS

## 2018-07-21 MED ORDER — SODIUM BICARBONATE 8.4 % IV SOLN
100.0000 meq | Freq: Once | INTRAVENOUS | Status: AC
  Administered 2018-07-21: 100 meq via INTRAVENOUS
  Filled 2018-07-21: qty 50

## 2018-07-21 MED ORDER — PIPERACILLIN-TAZOBACTAM 3.375 G IVPB
3.3750 g | Freq: Three times a day (TID) | INTRAVENOUS | Status: DC
  Filled 2018-07-21: qty 50

## 2018-07-21 NOTE — Progress Notes (Signed)
Repeat ABG collected, results below reportable range on ISTAT. Sample was going to be re-run in RT department, but was accidentally discarded. CCM NP aware of results and discarded sample.    pH 7.24  Co2<15  Po2 156   Na 145  K 2.8  iCa 1.20  Hct 33  Hb 11.2

## 2018-07-21 NOTE — Progress Notes (Signed)
Inpatient Diabetes Program Recommendations  AACE/ADA: New Consensus Statement on Inpatient Glycemic Control (2015)  Target Ranges:  Prepandial:   less than 140 mg/dL      Peak postprandial:   less than 180 mg/dL (1-2 hours)      Critically ill patients:  140 - 180 mg/dL   Lab Results  Component Value Date   GLUCAP 145 (H) 07/21/2018   HGBA1C 9.2 (H) 12/30/2017    Review of Glycemic Control Results for AMBERNICOLE, USREY (MRN [id number]) as of 07/21/2018 13:26  Ref. Range 07/21/2018 10:03 07/21/2018 11:01 07/21/2018 12:13 07/21/2018 13:09  Glucose-Capillary Latest Ref Range: 70 - 99 mg/dL 409 (H) 811 (H) 914 (H) 145 (H)   Diabetes history: T2DM Outpatient Diabetes medications: Metformin 1000 mg QAM, 500 mg QHS Current orders for Inpatient glycemic control: IV insulin per Glucostabilizer  Inpatient Diabetes Program Recommendations:     When ready for transition off IV insulin per MD (Venous CO2=20, normal anion gap (8-12), negative ketones, Insulin gtt rate < 3 units/hr), consider Novolog Sensitive Correction Scale/SSI (0-9 units) and Lantus 8 units. Administer Lantus 2 hrs prior to stopping insulin gtt.  Given steadily rising A1c since 2016 and last result in 2019, consider repeat A1c to determine recent glycemic control.   Thanks, Lujean Rave, MSN, RNC-OB Diabetes Coordinator [phone] (8a-5p)

## 2018-07-21 NOTE — Progress Notes (Signed)
ABG collected, was mixed sample. Critical results given to MD/PA. RT Will continue to monitor.

## 2018-07-21 NOTE — ED Provider Notes (Signed)
[facility] Provider Note   CSN: [id number] Arrival date & time: 07/21/18  0012    History   Chief Complaint Chief Complaint  Patient presents with   Hyperglycemia    HPI Mallory Stewart is a 58 y.o. female with a past medical history of DM, hypertension, pancreatitis, who presents today for evaluation of generally not feeling well.  She reports that since Wednesday she has had nausea vomiting diarrhea.  She reports that she has started feeling really poorly since Thursday and has not taken any of her diabetes medicines as she has not been eating.  She says that she has never felt this bad before.  She reports that yesterday she had abdominal pain however none today.  She denies any fevers or coughs at home.    She reports 15-20 lbs of unintentional weight loss over the past 1.5-2 months.      HPI  Past Medical History:  Diagnosis Date   Diabetes ([place])    Hypertension    Pancreatitis     Patient Active Problem List   Diagnosis Date Noted   Vertigo 12/26/2017   Muscle spasm of back 07/12/2017   Chronic pain 05/17/2017   Osteoarthritis of spine with radiculopathy, cervical region 09/17/2016   Back pain 07/26/2016   B12 deficiency 10/21/2014   Neuropathic pain of both legs 02/11/2014   Pancreatic pseudocyst/cyst 05/06/2013   Fatty liver 04/27/2013   Non-intractable vomiting with nausea 04/06/2011   Elevated liver enzymes 02/05/2011   EXCESSIVE OR FREQUENT MENSTRUATION 05/10/2009   Diabetes mellitus without complication ([place]) 24/40/1027   HYPERCHOLESTEROLEMIA 11/03/2008   Essential hypertension, benign 09/29/2008   PAP SMEAR, ABNORMAL 09/29/2008    Past Surgical History:  Procedure Laterality Date   CHOLECYSTECTOMY N/A 07/28/2016   Procedure: LAPAROSCOPIC CHOLECYSTECTOMY WITH INTRAOPERATIVE CHOLANGIOGRAM;  Surgeon: Mickeal Skinner, MD;  Location: [place];  Service: General;  Laterality: N/A;   LEEP   1990's   ORIF FOOT FRACTURE  09/2008   L 5th metatarsal   REFRACTIVE SURGERY  2000   TONSILLECTOMY       OB History   No obstetric history on file.      Home Medications    Prior to Admission medications   Medication Sig Start Date End Date Taking? Authorizing Provider  cyclobenzaprine (FLEXERIL) 10 MG tablet Take 0.5-1 tablets (5-10 mg total) by mouth 3 (three) times daily as needed for muscle spasms. 09/04/17   Copland, Frederico Hamman, MD  gabapentin (NEURONTIN) 300 MG capsule Take 4 capsules (1,200 mg total) by mouth at bedtime. 04/28/18   Bedsole, Amy E, MD  lisinopril (PRINIVIL,ZESTRIL) 40 MG tablet Take 1 tablet (40 mg total) by mouth daily. 12/26/17   Bedsole, Amy E, MD  Melatonin 10 MG CAPS Take 10 mg by mouth at bedtime.    [provider]  metFORMIN (GLUCOPHAGE-XR) 500 MG 24 hr tablet TAKE 2 TABLETS BY MOUTH IN  THE MORNING AND 1 TABLET AT BEDTIME 01/10/18   Bedsole, Amy E, MD  metoprolol tartrate (LOPRESSOR) 50 MG tablet TAKE 1 TABLET BY MOUTH TWICE A DAY 06/02/18   Bedsole, Amy E, MD  ONE TOUCH ULTRA TEST test strip CHECK BLOOD SUGAR TWO TIMES DAILY AS DIRECTED 08/15/17   Bedsole, Amy E, MD  predniSONE (DELTASONE) 20 MG tablet 2 tabs po for 5 days, then 1 tab po for 5 days 09/04/17   Owens Loffler, MD    Family History Family History  Problem Relation Age of Onset  Other Mother        tachycardia.Marland KitchenMarland Kitchen?afib   Stroke Father        after hernia suegery   Prostate cancer Father    Other Father        global transient amnesia, unclear source   Atrial fibrillation Sister    Healthy Brother    Healthy Brother    Coronary artery disease Paternal Grandmother    Heart attack Paternal Grandmother 30   Brain cancer Maternal Grandfather        ?   Cancer Paternal Grandfather        ?   Breast cancer Maternal Grandmother     Social History Social History   Tobacco Use   Smoking status: Never Smoker   Smokeless tobacco: Never Used  Substance Use Topics     Alcohol use: Yes    Alcohol/week: 7.0 standard drinks    Types: 7 Glasses of wine per week    Comment: wine   Drug use: No     Allergies   Simvastatin   Review of Systems Review of Systems  Constitutional: Negative for chills and fever.  Respiratory: Positive for shortness of breath. Negative for cough and chest tightness.   Cardiovascular: Negative for chest pain.  Gastrointestinal: Positive for diarrhea, nausea and vomiting. Negative for abdominal pain.  Neurological: Positive for weakness. Negative for headaches.  All other systems reviewed and are negative.    Physical Exam Updated Vital Signs BP (!) 143/103    Pulse (!) 136    Temp (!) 95.1 F (35.1 C) (Rectal)    Resp (!) 28    Ht 5' 8.25" (1.734 m)    Wt 78.8 kg    SpO2 100%    BMI 26.22 kg/m   Physical Exam Vitals signs and nursing note reviewed.  Constitutional:      General: She is in acute distress.     Appearance: She is well-developed. She is ill-appearing.  HENT:     Head: Normocephalic and atraumatic.     Mouth/Throat:     Mouth: Mucous membranes are dry.  Eyes:     Conjunctiva/sclera: Conjunctivae normal.  Neck:     Musculoskeletal: Normal range of motion and neck supple. No neck rigidity.  Cardiovascular:     Rate and Rhythm: Regular rhythm. Tachycardia present.     Heart sounds: No murmur.  Pulmonary:     Breath sounds: Normal breath sounds. No decreased breath sounds, wheezing, rhonchi or rales.     Comments: Patient is taking deep, rapid breaths. Abdominal:     General: Abdomen is flat. There is no distension.     Palpations: Abdomen is soft. There is no mass.     Tenderness: There is no abdominal tenderness.  Musculoskeletal:        General: No signs of injury.     Right lower leg: No edema.     Left lower leg: No edema.  Skin:    General: Skin is cool.     Coloration: Skin is mottled and pale.     Comments: Mottling over bilateral lower extremities.   Neurological:     General:  No focal deficit present.     Mental Status: She is alert and oriented to person, place, and time.     Comments: She is alert, able to answer questions appropriately, however her eyes are partially closed.  Purposeful movement.  Oriented to person place and time, able to provide history.  Psychiatric:  Mood and Affect: Mood normal.        Behavior: Behavior normal.        ED Treatments / Results  Labs (all labs ordered are listed, but only abnormal results are displayed) Labs Reviewed  CBC WITH DIFFERENTIAL/PLATELET - Abnormal; Notable for the following components:      Result Value   WBC 19.2 (*)    HCT 46.4 (*)    MCV 100.9 (*)    MCHC 29.7 (*)    Neutro Abs 16.9 (*)    Abs Immature Granulocytes 0.46 (*)    All other components within normal limits  BETA-HYDROXYBUTYRIC ACID - Abnormal; Notable for the following components:   Beta-Hydroxybutyric Acid >8.00 (*)    All other components within normal limits  COMPREHENSIVE METABOLIC PANEL - Abnormal; Notable for the following components:   CO2 <7 (*)    Glucose, Bld 618 (*)    Creatinine, Ser 1.83 (*)    AST 90 (*)    ALT 210 (*)    Alkaline Phosphatase 1,028 (*)    Total Bilirubin 2.7 (*)    GFR calc non Af Amer 31 (*)    GFR calc Af Amer 35 (*)    All other components within normal limits  LIPASE, BLOOD - Abnormal; Notable for the following components:   Lipase 105 (*)    All other components within normal limits  LACTIC ACID, PLASMA - Abnormal; Notable for the following components:   Lactic Acid, Venous 4.7 (*)    All other components within normal limits  LACTIC ACID, PLASMA - Abnormal; Notable for the following components:   Lactic Acid, Venous 4.2 (*)    All other components within normal limits  BLOOD GAS, ARTERIAL - Abnormal; Notable for the following components:   pH, Arterial 7.252 (*)    pO2, Arterial 187 (*)    Bicarbonate 3.6 (*)    All other components within normal limits  BASIC METABOLIC PANEL -  Abnormal; Notable for the following components:   Sodium 147 (*)    Potassium 2.9 (*)    CO2 <7 (*)    Glucose, Bld 478 (*)    Creatinine, Ser 1.73 (*)    Calcium 8.4 (*)    GFR calc non Af Amer 33 (*)    GFR calc Af Amer 38 (*)    All other components within normal limits  LACTIC ACID, PLASMA - Abnormal; Notable for the following components:   Lactic Acid, Venous 3.3 (*)    All other components within normal limits  GLUCOSE, CAPILLARY - Abnormal; Notable for the following components:   Glucose-Capillary 339 (*)    All other components within normal limits  CBG MONITORING, ED - Abnormal; Notable for the following components:   Glucose-Capillary 572 (*)    All other components within normal limits  POCT I-STAT 7, (LYTES, BLD GAS, ICA,H+H) - Abnormal; Notable for the following components:   pH, Arterial 7.039 (*)    pCO2 arterial 16.6 (*)    pO2, Arterial 55.0 (*)    Bicarbonate 4.5 (*)    TCO2 <5 (*)    Acid-base deficit 24.0 (*)    Potassium 3.3 (*)    All other components within normal limits  CBG MONITORING, ED - Abnormal; Notable for the following components:   Glucose-Capillary 532 (*)    All other components within normal limits  CBG MONITORING, ED - Abnormal; Notable for the following components:   Glucose-Capillary 428 (*)    All other  components within normal limits  CBG MONITORING, ED - Abnormal; Notable for the following components:   Glucose-Capillary 372 (*)    All other components within normal limits  CULTURE, BLOOD (ROUTINE X 2)  CULTURE, BLOOD (ROUTINE X 2)  MRSA PCR SCREENING  MAGNESIUM  PROCALCITONIN  URINALYSIS, ROUTINE W REFLEX MICROSCOPIC  BASIC METABOLIC PANEL  BASIC METABOLIC PANEL  BASIC METABOLIC PANEL  BASIC METABOLIC PANEL  HIV ANTIBODY (ROUTINE TESTING W REFLEX)  OCCULT BLOOD GASTRIC / DUODENUM (SPECIMEN CUP)  LACTIC ACID, PLASMA  I-STAT BETA HCG BLOOD, ED ([facility], [facility], [facility] ONLY)    EKG EKG Interpretation  Date/Time:  Monday July 21 2018  00:24:00 EDT Ventricular Rate:  143 PR Interval:    QRS Duration: 91 QT Interval:  363 QTC Calculation: 560 R Axis:   54 Text Interpretation:  Sinus tachycardia LAE, consider biatrial enlargement Abnormal R-wave progression, early transition Abnormal T, probable ischemia, inferior leads Prolonged QT interval Rate faster Confirmed by Ezequiel Essex [phone]) on 07/21/2018 12:41:21 AM   Radiology Ct Abdomen Pelvis Wo Contrast  Result Date: 07/21/2018 CLINICAL DATA:  Weight loss, nausea, vomiting, abnormal LFTs. EXAM: CT CHEST, ABDOMEN AND PELVIS WITHOUT CONTRAST TECHNIQUE: Multidetector CT imaging of the chest, abdomen and pelvis was performed following the standard protocol without IV contrast. COMPARISON:  CT abdomen and pelvis 07/26/2016 FINDINGS: CT CHEST FINDINGS Cardiovascular: Scattered aortic and coronary artery calcifications. Heart is normal size. Aorta is normal caliber. Mediastinum/Nodes: No mediastinal, hilar, or axillary adenopathy. Lungs/Pleura: No confluent opacities or effusions. Musculoskeletal: Chest wall soft tissues are unremarkable. No acute bony abnormality. CT ABDOMEN PELVIS FINDINGS Hepatobiliary: No focal liver abnormality is seen. Status post cholecystectomy. No biliary dilatation. Pancreas: Appears to be atrophy of the pancreatic body and tail. There is fullness in the region the pancreatic head, difficult to visualize well and separate from the adjacent duodenum on this noncontrast study. Spleen: No focal abnormality.  Normal size. Adrenals/Urinary Tract: No adrenal abnormality. No focal renal abnormality. No stones or hydronephrosis. Urinary bladder is unremarkable. Stomach/Bowel: There is significant gas and fluid distention of the stomach. The distal stomach and proximal duodenum are difficult to evaluate with lack of oral and IV contrast. Cannot exclude wall thickening in this area. Remainder of the small bowel is decompressed. Large bowel unremarkable. Appendix normal.  Vascular/Lymphatic: Aortic atherosclerosis. No enlarged abdominal or pelvic lymph nodes. Reproductive: Uterus and adnexa unremarkable.  No mass. Other: No free fluid or free air. Musculoskeletal: No acute bony abnormality. IMPRESSION: Abnormal appearance in the region of the distal stomach and 1st and 2nd portions of the duodenum. There appears to be some wall thickening in this area. The stomach is significantly distended with fluid and gas. In addition, there appears to be soft tissue fullness in the region of the pancreatic head. This area is difficult to evaluate with the lack of IV and oral contrast. Consider further evaluation with contrast enhanced CT with oral and IV contrast. This could be performed after NG tube placement to decompress the stomach. Coronary artery disease. No acute cardiopulmonary disease. Electronically Signed   By: Rolm Baptise M.D.   On: 07/21/2018 03:25   Ct Chest Wo Contrast  Result Date: 07/21/2018 CLINICAL DATA:  Weight loss, nausea, vomiting, abnormal LFTs. EXAM: CT CHEST, ABDOMEN AND PELVIS WITHOUT CONTRAST TECHNIQUE: Multidetector CT imaging of the chest, abdomen and pelvis was performed following the standard protocol without IV contrast. COMPARISON:  CT abdomen and pelvis 07/26/2016 FINDINGS: CT CHEST FINDINGS Cardiovascular: Scattered aortic and coronary artery  calcifications. Heart is normal size. Aorta is normal caliber. Mediastinum/Nodes: No mediastinal, hilar, or axillary adenopathy. Lungs/Pleura: No confluent opacities or effusions. Musculoskeletal: Chest wall soft tissues are unremarkable. No acute bony abnormality. CT ABDOMEN PELVIS FINDINGS Hepatobiliary: No focal liver abnormality is seen. Status post cholecystectomy. No biliary dilatation. Pancreas: Appears to be atrophy of the pancreatic body and tail. There is fullness in the region the pancreatic head, difficult to visualize well and separate from the adjacent duodenum on this noncontrast study. Spleen: No  focal abnormality.  Normal size. Adrenals/Urinary Tract: No adrenal abnormality. No focal renal abnormality. No stones or hydronephrosis. Urinary bladder is unremarkable. Stomach/Bowel: There is significant gas and fluid distention of the stomach. The distal stomach and proximal duodenum are difficult to evaluate with lack of oral and IV contrast. Cannot exclude wall thickening in this area. Remainder of the small bowel is decompressed. Large bowel unremarkable. Appendix normal. Vascular/Lymphatic: Aortic atherosclerosis. No enlarged abdominal or pelvic lymph nodes. Reproductive: Uterus and adnexa unremarkable.  No mass. Other: No free fluid or free air. Musculoskeletal: No acute bony abnormality. IMPRESSION: Abnormal appearance in the region of the distal stomach and 1st and 2nd portions of the duodenum. There appears to be some wall thickening in this area. The stomach is significantly distended with fluid and gas. In addition, there appears to be soft tissue fullness in the region of the pancreatic head. This area is difficult to evaluate with the lack of IV and oral contrast. Consider further evaluation with contrast enhanced CT with oral and IV contrast. This could be performed after NG tube placement to decompress the stomach. Coronary artery disease. No acute cardiopulmonary disease. Electronically Signed   By: Rolm Baptise M.D.   On: 07/21/2018 03:25   Dg Chest Portable 1 View  Result Date: 07/21/2018 CLINICAL DATA:  Shortness of Breath EXAM: PORTABLE CHEST 1 VIEW COMPARISON:  None. FINDINGS: Heart and mediastinal contours are within normal limits. No focal opacities or effusions. No acute bony abnormality. IMPRESSION: No active disease. Electronically Signed   By: Rolm Baptise M.D.   On: 07/21/2018 00:46   Dg Abd Portable 1v  Result Date: 07/21/2018 CLINICAL DATA:  Nasogastric tube placement EXAM: PORTABLE ABDOMEN - 1 VIEW COMPARISON:  Chest CT from earlier today FINDINGS: Nasogastric tube loops  through the stomach with tip at the fundus. The upper abdominal bowel gas pattern is normal. In bases are clear. Cholecystectomy clips. IMPRESSION: Nasogastric tube loops through the stomach with tip at the fundus. Electronically Signed   By: Monte Fantasia M.D.   On: 07/21/2018 05:24    Procedures .Critical Care Performed by: Lorin Glass, PA-C Authorized by: Lorin Glass, PA-C   Critical care provider statement:    Critical care time (minutes):  45   Critical care was necessary to treat or prevent imminent or life-threatening deterioration of the following conditions:  Sepsis, respiratory failure and shock   Critical care was time spent personally by me on the following activities:  Discussions with consultants, evaluation of patient's response to treatment, examination of patient, ordering and performing treatments and interventions, ordering and review of laboratory studies, ordering and review of radiographic studies, pulse oximetry, re-evaluation of patient's condition, obtaining history from patient or surrogate and review of old charts   (including critical care time)  Medications Ordered in ED Medications  insulin regular, human (MYXREDLIN) 100 units/ 100 mL infusion (9.4 Units/hr Intravenous Rate/Dose Change 07/21/18 0209)  dextrose 5 %-0.45 % sodium chloride infusion (has no administration  in time range)  sodium chloride 0.9 % bolus 2,364 mL (2,364 mLs Intravenous New Bag/Given 07/21/18 0102)     Initial Impression / Assessment and Plan / ED Course  I have reviewed the triage vital signs and the nursing notes.  Pertinent labs & imaging results that were available during my care of the patient were reviewed by me and considered in my medical decision making (see chart for details).  Clinical Course as of Jul 20 636  Mon Jul 21, 2018  0030 Dr. Wyvonnia Dusky informed of patient condition.    [EH]  0103 pH, Arterial(!!): 7.039 [EH]  0107 pCO2 arterial(!!): 16.6 [EH]    0107 Mixed sample  Bicarbonate(!): 4.5 [EH]  0124 Lactic Acid, Venous(!!): 4.7 [EH]  0236 Spoke with critical care doctor.    [EH]  9357 PCCM NP ordered zofran.     [EH]    Clinical Course User Index [EH] Lorin Glass, PA-C      Patient presents today for evaluation of hyperglycemia.  She has not been feeling well over the past 4 days and has developed nausea, vomiting, and diarrhea.  On arrival she appeared ill, she was tachycardic in the 140s, tachypneic with [place]ul like respirations, and mottled lower extremities.  She was awake and able to provide appropriate history.  He had not been taking her diabetes medicines for the past 4 days.  ABG and i-STAT labs were obtained, her pH is 7.039 with a PCO2 of 16.  Her bicarb is low at 4.5 with a potassium of 3.3.  Her actual CBG was elevated at 572.  30 mL/kg fluid bolus was ordered along with IV insulin.  Her to hydroxybutyric acid is elevated at over 8 consistent with DKA.  Her lactic acid came back elevated at 4.7, lipase is elevated at 105.  CMP is significant for AKI with a creatinine of 1.83 up from her baseline of 0.8.  Her AST, and ALT are both significantly elevated at 90 and 210 respectively.  Alk phos is markedly elevated at 1028.    CBC shows leukocytosis of 19.2, she is not anemic.  Blood cultures were obtained.  Initial temperature was 95.1 and she was placed on [facility].    CT chest, abdomen, and pelvis were obtained based on her clinical presentation along with her reported unintentional weight loss.  CT abdomen pelvis revealed a markedly enlarged stomach with swelling around the head of the pancreas however information was limited secondary to the size of her stomach.  Order for NG tube was placed.  Her EKG showed prolonged QT, magnesium was normal.    Critical care was consulted as patient is critically ill.  This patient was seen as a shared visit with Dr. Wyvonnia Dusky.  Patient was admitted by critical care to the  ICU.  Final Clinical Impressions(s) / ED Diagnoses   Final diagnoses:  Diabetic ketoacidosis without coma associated with type 2 diabetes mellitus (HCC)  Elevated alkaline phosphatase level  Abnormal CT scan  Gastric outlet obstruction  Abnormal pancreas function test  AKI (acute kidney injury) ([place])  Hypothermia not due to cold exposure    ED Discharge Orders    None       Ollen Gross 07/21/18 0650    Ezequiel Essex, MD 07/21/18 [phone]

## 2018-07-21 NOTE — ED Notes (Signed)
ED TO INPATIENT HANDOFF REPORT  ED Nurse Name and Phone #:  [place] [phone]  S Name/Age/Gender Mallory Stewart 58 y.o. female Room/Bed: 015C/015C  Code Status   Code Status: Full Code  Home/SNF/Other Home Patient oriented to: self, place, time and situation Is this baseline? Yes   Triage Complete: Triage complete  Chief Complaint hyperglycemia  Triage Note Brought by ems from home for c/o n/v for 3 days.  Endorses abdominal pain yesterday but none today.  Reports not being able to eat or take medications for several days.  Has not had htn and dm medication.     Allergies Allergies  Allergen Reactions  . Simvastatin     Elevated lfts?    Level of Care/Admitting Diagnosis ED Disposition    ED Disposition Condition Comment   Admit  Hospital Area: [facility] [[id number]]  Level of Care: ICU [6]  Diagnosis: DKA (diabetic ketoacidoses) [facility]) [[id number]]  Admitting Physician: Elyn Aquas [[id number]]  Attending Physician: Elyn Aquas [[id number]]  Estimated length of stay: 3 - 4 days  Certification:: I certify this patient will need inpatient services for at least 2 midnights  PT Class (Do Not Modify): Inpatient [101]  PT Acc Code (Do Not Modify): Private [1]       B Medical/Surgery History Past Medical History:  Diagnosis Date  . Diabetes (HCC)   . Hypertension   . Pancreatitis    Past Surgical History:  Procedure Laterality Date  . CHOLECYSTECTOMY N/A 07/28/2016   Procedure: LAPAROSCOPIC CHOLECYSTECTOMY WITH INTRAOPERATIVE CHOLANGIOGRAM;  Surgeon: Rodman Pickle, MD;  Location: [facility] OR;  Service: General;  Laterality: N/A;  . LEEP  1990's  . ORIF FOOT FRACTURE  09/2008   L 5th metatarsal  . REFRACTIVE SURGERY  2000  . TONSILLECTOMY       A IV Location/Drains/Wounds Patient Lines/Drains/Airways Status   Active Line/Drains/Airways    Name:   Placement date:   Placement time:   Site:   Days:   Peripheral IV 07/21/18 Right Forearm    07/21/18    0100    Forearm   less than 1   Peripheral IV 07/21/18 Left Antecubital   07/21/18    0200    Antecubital   less than 1   NG/OG Tube Nasogastric Left nare Xray   07/21/18    0508    Left nare   less than 1   Incision (Closed) 07/28/16 Abdomen Other (Comment)   07/28/16    1337     723   Incision - 3 Ports Abdomen Umbilicus Upper;Medial Right;Lateral   07/28/16    1305     723          Intake/Output Last 24 hours  Intake/Output Summary (Last 24 hours) at 07/21/2018 0539 Last data filed at 07/21/2018 0500 Gross per 24 hour  Intake 2364 ml  Output 1200 ml  Net 1164 ml    Labs/Imaging Results for orders placed or performed during the hospital encounter of 07/21/18 (from the past 48 hour(s))  CBG monitoring, ED     Status: Abnormal   Collection Time: 07/21/18 12:26 AM  Result Value Ref Range   Glucose-Capillary 572 (HH) 70 - 99 mg/dL  CBC with Differential (PNL)     Status: Abnormal   Collection Time: 07/21/18 12:39 AM  Result Value Ref Range   WBC 19.2 (H) 4.0 - 10.5 K/uL   RBC 4.60 3.87 - 5.11 MIL/uL   Hemoglobin 13.8 12.0 - 15.0 g/dL  HCT 46.4 (H) 36.0 - 46.0 %   MCV 100.9 (H) 80.0 - 100.0 fL   MCH 30.0 26.0 - 34.0 pg   MCHC 29.7 (L) 30.0 - 36.0 g/dL   RDW 16.112.1 09.611.5 - 04.515.5 %   Platelets 340 150 - 400 K/uL   nRBC 0.0 0.0 - 0.2 %   Neutrophils Relative % 89 %   Neutro Abs 16.9 (H) 1.7 - 7.7 K/uL   Lymphocytes Relative 6 %   Lymphs Abs 1.2 0.7 - 4.0 K/uL   Monocytes Relative 3 %   Monocytes Absolute 0.6 0.1 - 1.0 K/uL   Eosinophils Relative 0 %   Eosinophils Absolute 0.0 0.0 - 0.5 K/uL   Basophils Relative 0 %   Basophils Absolute 0.1 0.0 - 0.1 K/uL   Immature Granulocytes 2 %   Abs Immature Granulocytes 0.46 (H) 0.00 - 0.07 K/uL    Comment: Performed at [facility], [address] [place], [place] [address]  Beta-hydroxybutyric acid     Status: Abnormal   Collection Time: 07/21/18 12:39 AM  Result Value Ref Range   Beta-Hydroxybutyric Acid >8.00  (H) 0.05 - 0.27 mmol/L    Comment: RESULTS CONFIRMED BY MANUAL DILUTION Performed at [facility], [address] [place], [place] [address]   Comprehensive metabolic panel     Status: Abnormal   Collection Time: 07/21/18 12:39 AM  Result Value Ref Range   Sodium 143 135 - 145 mmol/L   Potassium 3.9 3.5 - 5.1 mmol/L   Chloride 102 98 - 111 mmol/L   CO2 <7 (L) 22 - 32 mmol/L   Glucose, Bld 618 (HH) 70 - 99 mg/dL    Comment: CRITICAL RESULT CALLED TO, READ BACK BY AND VERIFIED WITH: [facility],RN 07/21/18 0132 WAYK    BUN 18 6 - 20 mg/dL   Creatinine, Ser 8.291.83 (H) 0.44 - 1.00 mg/dL   Calcium 9.9 8.9 - 56.210.3 mg/dL   Total Protein 8.0 6.5 - 8.1 g/dL   Albumin 3.9 3.5 - 5.0 g/dL   AST 90 (H) 15 - 41 U/L   ALT 210 (H) 0 - 44 U/L   Alkaline Phosphatase 1,028 (H) 38 - 126 U/L   Total Bilirubin 2.7 (H) 0.3 - 1.2 mg/dL   GFR calc non Af Amer 31 (L) >60 mL/min   GFR calc Af Amer 35 (L) >60 mL/min    Comment: Performed at [facility], [address] [place], [place] [address]  Lipase, blood     Status: Abnormal   Collection Time: 07/21/18 12:39 AM  Result Value Ref Range   Lipase 105 (H) 11 - 51 U/L    Comment: Performed at [facility], [address] [place], [place] [address]  Lactic acid, plasma     Status: Abnormal   Collection Time: 07/21/18 12:39 AM  Result Value Ref Range   Lactic Acid, Venous 4.7 (HH) 0.5 - 1.9 mmol/L    Comment: CRITICAL RESULT CALLED TO, READ BACK BY AND VERIFIED WITH: Rodman PickleAYLOR J,RN 07/21/18 0121 WAYK Performed at [facility], [address] [place], [place] [address]   Magnesium     Status: None   Collection Time: 07/21/18 12:39 AM  Result Value Ref Range   Magnesium 1.8 1.7 - 2.4 mg/dL    Comment: Performed at [facility], [address] [place], [place] [address]  I-Stat beta hCG blood, ED     Status: None   Collection  Time: 07/21/18 12:44 AM  Result Value Ref Range   I-stat hCG, quantitative <5.0 <5 mIU/mL    Comment 3            Comment:   GEST. AGE      CONC.  (mIU/mL)   <=1 WEEK        5 - 50     2 WEEKS       50 - 500     3 WEEKS       100 - 10,000     4 WEEKS     1,000 - 30,000        FEMALE AND NON-PREGNANT FEMALE:     LESS THAN 5 mIU/mL   I-STAT 7, (LYTES, BLD GAS, ICA, H+H)     Status: Abnormal   Collection Time: 07/21/18 12:58 AM  Result Value Ref Range   pH, Arterial 7.039 (LL) 7.350 - 7.450   pCO2 arterial 16.6 (LL) 32.0 - 48.0 mmHg   pO2, Arterial 55.0 (L) 83.0 - 108.0 mmHg   Bicarbonate 4.5 (L) 20.0 - 28.0 mmol/L   TCO2 <5 (L) 22 - 32 mmol/L   O2 Saturation 74.0 %   Acid-base deficit 24.0 (H) 0.0 - 2.0 mmol/L   Sodium 140 135 - 145 mmol/L   Potassium 3.3 (L) 3.5 - 5.1 mmol/L   Calcium, Ion 1.24 1.15 - 1.40 mmol/L   HCT 42.0 36.0 - 46.0 %   Hemoglobin 14.3 12.0 - 15.0 g/dL   Patient temperature 16.1 F    Collection site RADIAL, ALLEN'S TEST ACCEPTABLE    Drawn by Operator    Sample type ARTERIAL    Comment NOTIFIED PHYSICIAN   CBG monitoring, ED     Status: Abnormal   Collection Time: 07/21/18  2:05 AM  Result Value Ref Range   Glucose-Capillary 532 (HH) 70 - 99 mg/dL   Comment 1 Notify RN   Lactic acid, plasma     Status: Abnormal   Collection Time: 07/21/18  3:34 AM  Result Value Ref Range   Lactic Acid, Venous 4.2 (HH) 0.5 - 1.9 mmol/L    Comment: CRITICAL RESULT CALLED TO, READ BACK BY AND VERIFIED WITH: Rodman Pickle 07/21/18 0424 WAYK Performed at [facility], [address], [place], [place] [address]   Procalcitonin - Baseline     Status: None   Collection Time: 07/21/18  3:34 AM  Result Value Ref Range   Procalcitonin 0.23 ng/mL    Comment:        Interpretation: PCT (Procalcitonin) <= 0.5 ng/mL: Systemic infection (sepsis) is not likely. Local bacterial infection is possible. (NOTE)       Sepsis PCT Algorithm           Lower Respiratory Tract                                      Infection PCT Algorithm    ----------------------------      ----------------------------         PCT < 0.25 ng/mL                PCT < 0.10 ng/mL         Strongly encourage             Strongly discourage   discontinuation of antibiotics    initiation of antibiotics    ----------------------------     -----------------------------  PCT 0.25 - 0.50 ng/mL            PCT 0.10 - 0.25 ng/mL               OR       >80% decrease in PCT            Discourage initiation of                                            antibiotics      Encourage discontinuation           of antibiotics    ----------------------------     -----------------------------         PCT >= 0.50 ng/mL              PCT 0.26 - 0.50 ng/mL               AND        <80% decrease in PCT             Encourage initiation of                                             antibiotics       Encourage continuation           of antibiotics    ----------------------------     -----------------------------        PCT >= 0.50 ng/mL                  PCT > 0.50 ng/mL               AND         increase in PCT                  Strongly encourage                                      initiation of antibiotics    Strongly encourage escalation           of antibiotics                                     -----------------------------                                           PCT <= 0.25 ng/mL                                                 OR                                        > 80% decrease in PCT  Discontinue / Do not initiate                                             antibiotics Performed at [facility], [address] [place], [place] [address]   Basic metabolic panel     Status: Abnormal   Collection Time: 07/21/18  3:34 AM  Result Value Ref Range   Sodium 147 (H) 135 - 145 mmol/L   Potassium 2.9 (L) 3.5 - 5.1 mmol/L   Chloride 110 98 - 111 mmol/L   CO2 <7 (L) 22 - 32 mmol/L   Glucose, Bld 478 (H) 70 - 99 mg/dL   BUN 17 6 - 20 mg/dL    Creatinine, Ser 3.74 (H) 0.44 - 1.00 mg/dL   Calcium 8.4 (L) 8.9 - 10.3 mg/dL   GFR calc non Af Amer 33 (L) >60 mL/min   GFR calc Af Amer 38 (L) >60 mL/min    Comment: Performed at [facility], [address], [place], [place] [address]  CBG monitoring, ED     Status: Abnormal   Collection Time: 07/21/18  3:35 AM  Result Value Ref Range   Glucose-Capillary 428 (H) 70 - 99 mg/dL  Blood gas, arterial     Status: Abnormal   Collection Time: 07/21/18  4:00 AM  Result Value Ref Range   FIO2 21.00    pH, Arterial 7.252 (L) 7.350 - 7.450   pCO2 arterial  32.0 - 48.0 mmHg    CRITICAL RESULT CALLED TO, READ BACK BY AND VERIFIED WITH:    Comment: BELOW REPORTABLE RANGE CARRIE MIKKE, RRT BY T COCKMAN, RRT ON 07/21/18 AT 0412    pO2, Arterial 187 (H) 83.0 - 108.0 mmHg   Bicarbonate 3.6 (L) 20.0 - 28.0 mmol/L   O2 Saturation 98.9 %   Patient temperature 98.6    Collection site BRACHIAL ARTERY    Drawn by [id number]    Sample type ARTERIAL DRAW   CBG monitoring, ED     Status: Abnormal   Collection Time: 07/21/18  4:45 AM  Result Value Ref Range   Glucose-Capillary 372 (H) 70 - 99 mg/dL   Ct Abdomen Pelvis Wo Contrast  Result Date: 07/21/2018 CLINICAL DATA:  Weight loss, nausea, vomiting, abnormal LFTs. EXAM: CT CHEST, ABDOMEN AND PELVIS WITHOUT CONTRAST TECHNIQUE: Multidetector CT imaging of the chest, abdomen and pelvis was performed following the standard protocol without IV contrast. COMPARISON:  CT abdomen and pelvis 07/26/2016 FINDINGS: CT CHEST FINDINGS Cardiovascular: Scattered aortic and coronary artery calcifications. Heart is normal size. Aorta is normal caliber. Mediastinum/Nodes: No mediastinal, hilar, or axillary adenopathy. Lungs/Pleura: No confluent opacities or effusions. Musculoskeletal: Chest wall soft tissues are unremarkable. No acute bony abnormality. CT ABDOMEN PELVIS FINDINGS Hepatobiliary: No focal liver abnormality is seen. Status post cholecystectomy. No biliary  dilatation. Pancreas: Appears to be atrophy of the pancreatic body and tail. There is fullness in the region the pancreatic head, difficult to visualize well and separate from the adjacent duodenum on this noncontrast study. Spleen: No focal abnormality.  Normal size. Adrenals/Urinary Tract: No adrenal abnormality. No focal renal abnormality. No stones or hydronephrosis. Urinary bladder is unremarkable. Stomach/Bowel: There is significant gas and fluid distention of the stomach. The distal stomach and proximal duodenum are difficult to evaluate with lack of oral and IV contrast. Cannot exclude wall thickening in this area. Remainder  of the small bowel is decompressed. Large bowel unremarkable. Appendix normal. Vascular/Lymphatic: Aortic atherosclerosis. No enlarged abdominal or pelvic lymph nodes. Reproductive: Uterus and adnexa unremarkable.  No mass. Other: No free fluid or free air. Musculoskeletal: No acute bony abnormality. IMPRESSION: Abnormal appearance in the region of the distal stomach and 1st and 2nd portions of the duodenum. There appears to be some wall thickening in this area. The stomach is significantly distended with fluid and gas. In addition, there appears to be soft tissue fullness in the region of the pancreatic head. This area is difficult to evaluate with the lack of IV and oral contrast. Consider further evaluation with contrast enhanced CT with oral and IV contrast. This could be performed after NG tube placement to decompress the stomach. Coronary artery disease. No acute cardiopulmonary disease. Electronically Signed   By: Charlett Nose M.D.   On: 07/21/2018 03:25   Ct Chest Wo Contrast  Result Date: 07/21/2018 CLINICAL DATA:  Weight loss, nausea, vomiting, abnormal LFTs. EXAM: CT CHEST, ABDOMEN AND PELVIS WITHOUT CONTRAST TECHNIQUE: Multidetector CT imaging of the chest, abdomen and pelvis was performed following the standard protocol without IV contrast. COMPARISON:  CT abdomen and  pelvis 07/26/2016 FINDINGS: CT CHEST FINDINGS Cardiovascular: Scattered aortic and coronary artery calcifications. Heart is normal size. Aorta is normal caliber. Mediastinum/Nodes: No mediastinal, hilar, or axillary adenopathy. Lungs/Pleura: No confluent opacities or effusions. Musculoskeletal: Chest wall soft tissues are unremarkable. No acute bony abnormality. CT ABDOMEN PELVIS FINDINGS Hepatobiliary: No focal liver abnormality is seen. Status post cholecystectomy. No biliary dilatation. Pancreas: Appears to be atrophy of the pancreatic body and tail. There is fullness in the region the pancreatic head, difficult to visualize well and separate from the adjacent duodenum on this noncontrast study. Spleen: No focal abnormality.  Normal size. Adrenals/Urinary Tract: No adrenal abnormality. No focal renal abnormality. No stones or hydronephrosis. Urinary bladder is unremarkable. Stomach/Bowel: There is significant gas and fluid distention of the stomach. The distal stomach and proximal duodenum are difficult to evaluate with lack of oral and IV contrast. Cannot exclude wall thickening in this area. Remainder of the small bowel is decompressed. Large bowel unremarkable. Appendix normal. Vascular/Lymphatic: Aortic atherosclerosis. No enlarged abdominal or pelvic lymph nodes. Reproductive: Uterus and adnexa unremarkable.  No mass. Other: No free fluid or free air. Musculoskeletal: No acute bony abnormality. IMPRESSION: Abnormal appearance in the region of the distal stomach and 1st and 2nd portions of the duodenum. There appears to be some wall thickening in this area. The stomach is significantly distended with fluid and gas. In addition, there appears to be soft tissue fullness in the region of the pancreatic head. This area is difficult to evaluate with the lack of IV and oral contrast. Consider further evaluation with contrast enhanced CT with oral and IV contrast. This could be performed after NG tube placement to  decompress the stomach. Coronary artery disease. No acute cardiopulmonary disease. Electronically Signed   By: Charlett Nose M.D.   On: 07/21/2018 03:25   Dg Chest Portable 1 View  Result Date: 07/21/2018 CLINICAL DATA:  Shortness of Breath EXAM: PORTABLE CHEST 1 VIEW COMPARISON:  None. FINDINGS: Heart and mediastinal contours are within normal limits. No focal opacities or effusions. No acute bony abnormality. IMPRESSION: No active disease. Electronically Signed   By: Charlett Nose M.D.   On: 07/21/2018 00:46   Dg Abd Portable 1v  Result Date: 07/21/2018 CLINICAL DATA:  Nasogastric tube placement EXAM: PORTABLE ABDOMEN - 1 VIEW COMPARISON:  Chest CT from earlier today FINDINGS: Nasogastric tube loops through the stomach with tip at the fundus. The upper abdominal bowel gas pattern is normal. In bases are clear. Cholecystectomy clips. IMPRESSION: Nasogastric tube loops through the stomach with tip at the fundus. Electronically Signed   By: Marnee Spring M.D.   On: 07/21/2018 05:24    Pending Labs Unresulted Labs (From admission, onward)    Start     Ordered   07/22/18 0500  Procalcitonin  Daily,   R     07/21/18 0237   07/21/18 0513  Lactic acid, plasma  STAT Now then every 3 hours,   R     07/21/18 0512   07/21/18 0453  Occult blood gastric / duodenum  Once,   R     07/21/18 0453   07/21/18 0445  HIV antibody (Routine Testing)  Once,   R     07/21/18 0444   07/21/18 0428  Basic metabolic panel  Now then every 4 hours,   R     07/21/18 0428   07/21/18 0108  Culture, blood (routine x 2)  BLOOD CULTURE X 2,   STAT     07/21/18 0107   07/21/18 0025  Urinalysis, Routine w reflex microscopic  ONCE - STAT,   STAT     07/21/18 0027          Vitals/Pain Today's Vitals   07/21/18 0445 07/21/18 0500 07/21/18 0515 07/21/18 0530  BP: (!) 161/125 (!) 151/118 (!) 144/115 (!) 139/112  Pulse: (!) 134 (!) 131 (!) 123 (!) 121  Resp: (!) 28 (!) 21 (!) 23 (!) 26  Temp:      TempSrc:      SpO2:  100% 100% 100% 100%  Weight:      Height:      PainSc:        Isolation Precautions No active isolations  Medications Medications  insulin regular, human (MYXREDLIN) 100 units/ 100 mL infusion (5.6 Units/hr Intravenous Rate/Dose Change 07/21/18 0448)  dextrose 5 %-0.45 % sodium chloride infusion (has no administration in time range)  metoprolol tartrate (LOPRESSOR) injection 5 mg (has no administration in time range)  hydrALAZINE (APRESOLINE) injection 5 mg (has no administration in time range)  heparin injection 5,000 Units (has no administration in time range)  sodium bicarbonate 150 mEq in sterile water 1,000 mL infusion (has no administration in time range)  potassium chloride 10 mEq in 100 mL IVPB (has no administration in time range)  pantoprazole (PROTONIX) injection 40 mg (has no administration in time range)  sodium chloride 0.9 % bolus 2,364 mL (0 mL/kg  78.8 kg Intravenous Stopped 07/21/18 0314)  lactated ringers bolus 2,000 mL (2,000 mLs Intravenous New Bag/Given 07/21/18 0249)  sodium bicarbonate injection 100 mEq (100 mEq Intravenous Given 07/21/18 0248)  metoprolol tartrate (LOPRESSOR) injection 5 mg (5 mg Intravenous Given 07/21/18 0323)  ondansetron (ZOFRAN) injection 4 mg (4 mg Intravenous Given 07/21/18 0339)  Benzocaine (HURRCAINE) 20 % mouth spray ( Mouth/Throat Given 07/21/18 0459)    Mobility non-ambulatory Low fall risk   Focused Assessments Cardiac Assessment Handoff:  Cardiac Rhythm: Sinus tachycardia Lab Results  Component Value Date   TROPONINI < 0.02 04/07/2013   No results found for: DDIMER Does the Patient currently have chest pain? No  , Pulmonary Assessment Handoff:  Lung sounds: Bilateral Breath Sounds: Clear L Breath Sounds: Clear R Breath Sounds: Clear O2 Device: Room Air        R Recommendations: See Admitting  Provider Note  Report given to:   Additional Notes:  Pt on Glucostabilizer and has NG tube. A+O x4.

## 2018-07-21 NOTE — Discharge Instructions (Addendum)
Diabetic Ketoacidosis  Diabetic ketoacidosis is a serious complication of diabetes. This condition develops when there is not enough insulin in the body. Insulin is an hormone that regulates blood sugar levels in the body. Normally, insulin allows glucose to enter the cells in the body. The cells break down glucose for energy. Without enough insulin, the body cannot break down glucose, so it breaks down fats instead. This leads to high blood glucose levels in the body and the production of acids that are called ketones. Ketones are poisonous at high levels.  If diabetic ketoacidosis is not treated, it can cause severe dehydration and can lead to a coma or death.  What are the causes?  This condition develops when a lack of insulin causes the body to break down fats instead of glucose. This may be triggered by:  · Stress on the body. This stress is brought on by an illness.  · Infection.  · Medicines that raise blood glucose levels.  · Not taking diabetes medicine.  · New onset of type 1 diabetes mellitus.  What are the signs or symptoms?  Symptoms of this condition include:  · Fatigue.  · Weight loss.  · Excessive thirst.  · Light-headedness.  · Fruity or sweet-smelling breath.  · Excessive urination.  · Vision changes.  · Confusion or irritability.  · Nausea.  · Vomiting.  · Rapid breathing.  · Abdominal pain.  · Feeling flushed.  How is this diagnosed?  This condition is diagnosed based on your medical history, a physical exam, and blood tests. You may also have a urine test to check for ketones.  How is this treated?  This condition may be treated with:  · Fluid replacement. This may be done to correct dehydration.  · Insulin injections. These may be given through the skin or through an IV tube.  · Electrolyte replacement. Electrolytes are minerals in your blood. Electrolytes such as potassium and sodium may be given in pill form or through an IV tube.  · Antibiotic medicines. These may be prescribed if your  condition was caused by an infection.  Diabetic ketoacidosis is a serious medical condition. You may need emergency treatment in the hospital to monitor your condition.  Follow these instructions at home:  Eating and drinking  · Drink enough fluids to keep your urine clear or pale yellow.  · If you are not able to eat, drink clear fluids in small amounts as you are able. Clear fluids include water, ice chips, fruit juice with water added (diluted), and low-calorie sports drinks. You may also have sugar-free jello or popsicles.  · If you are able to eat, follow your usual diet and drink sugar-free liquids, such as water.  Medicines  · Take over-the-counter and prescription medicines only as told by your health care provider.  · Continue to take insulin and other diabetes medicines as told by your health care provider.  · If you were prescribed an antibiotic, take it as told by your health care provider. Do not stop taking the antibiotic even if you start to feel better.  General instructions    · Check your urine for ketones when you are ill and as told by your health care provider.  ? If your blood glucose is 240 mg/dL (13.3 mmol/L) or higher, check your urine ketones every 4-6 hours.  · Check your blood glucose every day, as often as told by your health care provider.  ? If your blood glucose is high, drink   plenty of fluids. This helps to flush out ketones.  ? If your blood glucose is above your target for 2 tests in a row, contact your health care provider.  · Carry a medical alert card or wear medical alert jewelry that says that you have diabetes.  · Rest and exercise only as told by your health care provider. Do not exercise when your blood glucose is high and you have ketones in your urine.  · If you get sick, call your health care provider and begin treatment quickly. Your body often needs extra insulin to fight an illness. Check your blood glucose every 4-6 hours when you are sick.  · Keep all follow-up  visits as told by your health care provider. This is important.  Contact a health care provider if:  · Your blood glucose level is higher than 240 mg/dL (13.3 mmol/L) for 2 days in a row.  · You have moderate or large ketones in your urine.  · You have a fever.  · You cannot eat or drink without vomiting.  · You have been vomiting for more than 2 hours.  · You continue to have symptoms of diabetic ketoacidosis.  · You develop new symptoms.  Get help right away if:  · Your blood glucose monitor reads “high” even when you are taking insulin.  · You faint.  · You have chest pain.  · You have trouble breathing.  · You have sudden trouble speaking or swallowing.  · You have vomiting or diarrhea that gets worse after 3 hours.  · You are unable to stay awake.  · You have trouble thinking.  · You are severely dehydrated. Symptoms of severe dehydration include:  ? Extreme thirst.  ? Dry mouth.  ? Rapid breathing.  These symptoms may represent a serious problem that is an emergency. Do not wait to see if the symptoms will go away. Get medical help right away. Call your local emergency services (911 in the [place] Do not drive yourself to the hospital.  Summary  · Diabetic ketoacidosis is a serious complication of diabetes. This condition develops when there is not enough insulin in the body.  · This condition is diagnosed based on your medical history, a physical exam, and blood tests. You may also have a urine test to check for ketones.  · Diabetic ketoacidosis is a serious medical condition. You may need emergency treatment in the hospital to monitor your condition.  · Contact your health care provider if your blood glucose is higher than 240 mg/dl for 2 days in a row or if you have moderate or large ketones in your urine.  This information is not intended to replace advice given to you by your health care provider. Make sure you discuss any questions you have with your health care provider.  Document Released: 04/20/2000  Document Revised: 05/28/2016 Document Reviewed: 05/28/2016  Elsevier Interactive Patient Education © 2019 [employer]

## 2018-07-21 NOTE — ED Notes (Addendum)
Date and time results received: 07/21/18 0121  Test: Lactic acid Critical Value: 4.7  Name of Provider Notified: PA Jeraldine Loots  Orders Received? Or Actions Taken?: No new orders at this time

## 2018-07-21 NOTE — ED Notes (Signed)
Per NP KB collect labs in 30 minutes.

## 2018-07-21 NOTE — H&P (Addendum)
NAME:  Mallory Stewart, MRN:  [id number], DOB:  11-Jul-1960, LOS: 0 ADMISSION DATE:  07/21/2018, CONSULTATION DATE:  07/21/2018 REFERRING MD:  Dr. Manus Gunning, CHIEF COMPLAINT: DKA  History of present illness   58 year old female presents to ED on 3/16 with reported Nausea/Vomiting/Diarrhea for last 3 days. Has been unable to take her medications during this time. On arrival HR 143, BP 148/102. ABG 7.039/16/55. Glucose 618. LA 4.7. Given 2L NS and started on insulin gtt.   Husband reports that patient has had significant weight loss over the last 2 months associated with poor appetite and weakness.   Past Medical History  DM, HTN, Pancreatitis, Cholecystectomy  Significant Hospital Events   3/16 > Presents to ED   Consults:  PCCM  Procedures:    Significant Diagnostic Tests:  CXR 3/16 > No active disease  CT Chest/A/P 3/16 > Abnormal appearance in the region of the distal stomach and 1st and 2nd portions of the duodenum. There appears to be some wall thickening in this area. The stomach is significantly distended with fluid and gas. In addition, there appears to be soft tissue fullness in the region of the pancreatic head. This area is difficult to evaluate with the lack of IV and oral contrast. Consider further evaluation with contrast enhanced CT with oral and IV contrast. This could be performed after NG tube placement to decompress the stomach.  Micro Data:  Blood 3/16 >  Urine 3/16 >   Antimicrobials:  Zosyn 3/16    Interim history/subjective:  As above   Objective   Blood pressure (!) 159/110, pulse (!) 103, temperature (!) 95.1 F (35.1 C), temperature source Rectal, resp. rate (!) 28, height 5' 8.25" (1.734 m), weight 78.8 kg, SpO2 100 %.        Intake/Output Summary (Last 24 hours) at 07/21/2018 0402 Last data filed at 07/21/2018 0314 Gross per 24 hour  Intake 2364 ml  Output -  Net 2364 ml   Filed Weights   07/21/18 0017  Weight: 78.8 kg    Examination:  General: Adult female,  HENT: Dry MM  Lungs: Tachypnea, Clear breath sounds, no wheeze/crackles  Cardiovascular: Tachycardia, no MRG Abdomen: Distended, active bowel sounds, generalized tenderness  Extremities: mottling noted to lower extremities  Neuro: alert, oriented, follows commands  GU: intact   Resolved Hospital Problem list     Assessment & Plan:   Tachycardia in setting of hypovolemia H/O HTN Plan  -Cardiac Monitoring  -Fluids as below  -on home Metoprolol 50 mg BID given NPO status will order Metoprolol 5 mg q6h  -PRN Hydralazine   Anion Gap Metabolic Acidosis with Lactic Acidosis  AKI  Plan -S/P 2L NS, Give additional 2L now -Trend LA  -Trend BMP   DKA H/O DM Plan  -DKA protocol  -Trend BMP   Nausea/Vomiting > CT with hardening around stomach and ?mass to pancreatic head  Plan -NPO -Obtain CT with contrast when able to further assess -[place] ABD -Place NG tube  > 1.2 L out appears coffee ground  Best practice:  Diet: NPO DVT prophylaxis: Heparin SQ GI prophylaxis: PPI Glucose control: Insulin gtt  Mobility: Bedrest  Code Status: Full Code  Family Communication: Husband updated at bedside   Labs   CBC: Recent Labs  Lab 07/21/18 0039 07/21/18 0058  WBC 19.2*  --   NEUTROABS 16.9*  --   HGB 13.8 14.3  HCT 46.4* 42.0  MCV 100.9*  --   PLT 340  --  Basic Metabolic Panel: Recent Labs  Lab 07/21/18 0039 07/21/18 0058  NA 143 140  K 3.9 3.3*  CL 102  --   CO2 <7*  --   GLUCOSE 618*  --   BUN 18  --   CREATININE 1.83*  --   CALCIUM 9.9  --   MG 1.8  --    GFR: Estimated Creatinine Clearance: 38.5 mL/min (A) (by C-G formula based on SCr of 1.83 mg/dL (H)). Recent Labs  Lab 07/21/18 0039  WBC 19.2*  LATICACIDVEN 4.7*    Liver Function Tests: Recent Labs  Lab 07/21/18 0039  AST 90*  ALT 210*  ALKPHOS 1,028*  BILITOT 2.7*  PROT 8.0  ALBUMIN 3.9   Recent Labs  Lab 07/21/18 0039  LIPASE 105*   No results for  input(s): AMMONIA in the last 168 hours.  ABG    Component Value Date/Time   PHART 7.039 (LL) 07/21/2018 0058   PCO2ART 16.6 (LL) 07/21/2018 0058   PO2ART 55.0 (L) 07/21/2018 0058   HCO3 4.5 (L) 07/21/2018 0058   TCO2 <5 (L) 07/21/2018 0058   ACIDBASEDEF 24.0 (H) 07/21/2018 0058   O2SAT 74.0 07/21/2018 0058     Coagulation Profile: No results for input(s): INR, PROTIME in the last 168 hours.  Cardiac Enzymes: No results for input(s): CKTOTAL, CKMB, CKMBINDEX, TROPONINI in the last 168 hours.  HbA1C: Hgb A1c MFr Bld  Date/Time Value Ref Range Status  12/30/2017 08:06 AM 9.2 (H) 4.6 - 6.5 % Final    Comment:    Glycemic Control Guidelines for People with Diabetes:Non Diabetic:  <6%Goal of Therapy: <7%Additional Action Suggested:  >8%   07/02/2017 08:44 AM 6.9 (H) 4.6 - 6.5 % Final    Comment:    Glycemic Control Guidelines for People with Diabetes:Non Diabetic:  <6%Goal of Therapy: <7%Additional Action Suggested:  >8%     CBG: Recent Labs  Lab 07/21/18 0026 07/21/18 0205  GLUCAP 572* 532*    Review of Systems:   All negative; except for those that are bolded, which indicate positives.  Constitutional: weight loss, weight gain, night sweats, fevers, chills, fatigue, weakness.  HEENT: headaches, sore throat, sneezing, nasal congestion, post nasal drip, difficulty swallowing, tooth/dental problems, visual complaints, visual changes, ear aches. Neuro: difficulty with speech, weakness, numbness, ataxia. CV:  chest pain, orthopnea, PND, swelling in lower extremities, dizziness, palpitations, syncope.  Resp: cough, hemoptysis, dyspnea, wheezing. GI: heartburn, indigestion, abdominal pain, nausea, vomiting, diarrhea, constipation, change in bowel habits, loss of appetite, hematemesis, melena, hematochezia.  GU: dysuria, change in color of urine, urgency or frequency, flank pain, hematuria. MSK: joint pain or swelling, decreased range of motion. Psych: change in mood or affect,  depression, anxiety, suicidal ideations, homicidal ideations. Skin: rash, itching, bruising.    Past Medical History  She,  has a past medical history of Diabetes (HCC), Hypertension, and Pancreatitis.   Surgical History    Past Surgical History:  Procedure Laterality Date  . CHOLECYSTECTOMY N/A 07/28/2016   Procedure: LAPAROSCOPIC CHOLECYSTECTOMY WITH INTRAOPERATIVE CHOLANGIOGRAM;  Surgeon: Rodman Pickle, MD;  Location: [facility] OR;  Service: General;  Laterality: N/A;  . LEEP  1990's  . ORIF FOOT FRACTURE  09/2008   L 5th metatarsal  . REFRACTIVE SURGERY  2000  . TONSILLECTOMY       Social History   reports that she has never smoked. She has never used smokeless tobacco. She reports current alcohol use of about 7.0 standard drinks of alcohol per week. She reports that  she does not use drugs.   Family History   Her family history includes Atrial fibrillation in her sister; Brain cancer in her maternal grandfather; Breast cancer in her maternal grandmother; Cancer in her paternal grandfather; Coronary artery disease in her paternal grandmother; Healthy in her brother and brother; Heart attack (age of onset: 84) in her paternal grandmother; Other in her father and mother; Prostate cancer in her father; Stroke in her father.   Allergies Allergies  Allergen Reactions  . Simvastatin     Elevated lfts?     Home Medications  Prior to Admission medications   Medication Sig Start Date End Date Taking? Authorizing Provider  cyclobenzaprine (FLEXERIL) 10 MG tablet Take 0.5-1 tablets (5-10 mg total) by mouth 3 (three) times daily as needed for muscle spasms. 09/04/17   Copland, Karleen Hampshire, MD  gabapentin (NEURONTIN) 300 MG capsule Take 4 capsules (1,200 mg total) by mouth at bedtime. 04/28/18   Bedsole, Amy E, MD  lisinopril (PRINIVIL,ZESTRIL) 40 MG tablet Take 1 tablet (40 mg total) by mouth daily. 12/26/17   Bedsole, Amy E, MD  Melatonin 10 MG CAPS Take 10 mg by mouth at bedtime.    [provider]  metFORMIN (GLUCOPHAGE-XR) 500 MG 24 hr tablet TAKE 2 TABLETS BY MOUTH IN  THE MORNING AND 1 TABLET AT BEDTIME 01/10/18   Bedsole, Amy E, MD  metoprolol tartrate (LOPRESSOR) 50 MG tablet TAKE 1 TABLET BY MOUTH TWICE A DAY 06/02/18   Bedsole, Amy E, MD  ONE TOUCH ULTRA TEST test strip CHECK BLOOD SUGAR TWO TIMES DAILY AS DIRECTED 08/15/17   Bedsole, Amy E, MD  predniSONE (DELTASONE) 20 MG tablet 2 tabs po for 5 days, then 1 tab po for 5 days 09/04/17   Hannah Beat, MD     Critical care time: 42 minutes     Jovita Kussmaul, AGACNP-BC [facility]  PCCM Pgr: [phone]

## 2018-07-21 NOTE — ED Triage Notes (Signed)
Brought by ems from home for c/o n/v for 3 days.  Endorses abdominal pain yesterday but none today.  Reports not being able to eat or take medications for several days.  Has not had htn and dm medication.

## 2018-07-22 ENCOUNTER — Other Ambulatory Visit: Payer: Self-pay

## 2018-07-22 ENCOUNTER — Ambulatory Visit: Payer: 59 | Admitting: Family Medicine

## 2018-07-22 DIAGNOSIS — K859 Acute pancreatitis without necrosis or infection, unspecified: Secondary | ICD-10-CM

## 2018-07-22 LAB — BASIC METABOLIC PANEL
Anion gap: 10 (ref 5–15)
Anion gap: 13 (ref 5–15)
Anion gap: 8 (ref 5–15)
BUN: 11 mg/dL (ref 6–20)
BUN: 11 mg/dL (ref 6–20)
BUN: 8 mg/dL (ref 6–20)
CHLORIDE: 107 mmol/L (ref 98–111)
CO2: 23 mmol/L (ref 22–32)
CO2: 21 mmol/L — ABNORMAL LOW (ref 22–32)
CO2: 24 mmol/L (ref 22–32)
Calcium: 8.3 mg/dL — ABNORMAL LOW (ref 8.9–10.3)
Calcium: 8.3 mg/dL — ABNORMAL LOW (ref 8.9–10.3)
Calcium: 8 mg/dL — ABNORMAL LOW (ref 8.9–10.3)
Chloride: 105 mmol/L (ref 98–111)
Chloride: 106 mmol/L (ref 98–111)
Creatinine, Ser: 0.87 mg/dL (ref 0.44–1.00)
Creatinine, Ser: 0.84 mg/dL (ref 0.44–1.00)
Creatinine, Ser: 0.82 mg/dL (ref 0.44–1.00)
GFR calc Af Amer: >60 mL/min (ref >60)
GFR calc Af Amer: >60 mL/min (ref >60)
GFR calc Af Amer: >60 mL/min (ref >60)
GFR calc non Af Amer: >60 mL/min (ref >60)
GFR calc non Af Amer: >60 mL/min (ref >60)
Glucose, Bld: 134 mg/dL — ABNORMAL HIGH (ref 70–99)
Glucose, Bld: 302 mg/dL — ABNORMAL HIGH (ref 70–99)
Glucose, Bld: 277 mg/dL — ABNORMAL HIGH (ref 70–99)
POTASSIUM: 3.3 mmol/L — AB (ref 3.5–5.1)
Potassium: 3.4 mmol/L — ABNORMAL LOW (ref 3.5–5.1)
Potassium: 2.9 mmol/L — ABNORMAL LOW (ref 3.5–5.1)
Sodium: 140 mmol/L (ref 135–145)
Sodium: 139 mmol/L (ref 135–145)
Sodium: 138 mmol/L (ref 135–145)

## 2018-07-22 LAB — PROCALCITONIN: Procalcitonin: 0.48 ng/mL

## 2018-07-22 LAB — TROPONIN I: Troponin I: 0.2 ng/mL (ref <0.03)

## 2018-07-22 LAB — HIV ANTIBODY (ROUTINE TESTING W REFLEX): HIV Screen 4th Generation wRfx: NONREACTIVE

## 2018-07-22 LAB — GLUCOSE, CAPILLARY
GLUCOSE-CAPILLARY: 264 mg/dL — AB (ref 70–99)
GLUCOSE-CAPILLARY: 233 mg/dL — AB (ref 70–99)
Glucose-Capillary: 210 mg/dL — ABNORMAL HIGH (ref 70–99)
Glucose-Capillary: 210 mg/dL — ABNORMAL HIGH (ref 70–99)
Glucose-Capillary: 235 mg/dL — ABNORMAL HIGH (ref 70–99)

## 2018-07-22 MED ORDER — INSULIN GLARGINE 100 UNIT/ML ~~LOC~~ SOLN
2.0000 [IU] | Freq: Once | SUBCUTANEOUS | Status: AC
  Administered 2018-07-22: 2 [IU] via SUBCUTANEOUS
  Filled 2018-07-22: qty 0.02

## 2018-07-22 MED ORDER — INSULIN GLARGINE 100 UNIT/ML ~~LOC~~ SOLN
14.0000 [IU] | Freq: Two times a day (BID) | SUBCUTANEOUS | Status: DC
  Administered 2018-07-22 – 2018-07-24 (×4): 14 [IU] via SUBCUTANEOUS
  Filled 2018-07-22 (×5): qty 0.14

## 2018-07-22 MED ORDER — POTASSIUM CHLORIDE 10 MEQ/100ML IV SOLN
10.0000 meq | INTRAVENOUS | Status: AC
  Administered 2018-07-22 (×4): 10 meq via INTRAVENOUS
  Filled 2018-07-22 (×4): qty 100

## 2018-07-22 NOTE — Progress Notes (Addendum)
Inpatient Diabetes Program Recommendations  AACE/ADA: New Consensus Statement on Inpatient Glycemic Control (2015)  Target Ranges:  Prepandial:   less than 140 mg/dL      Peak postprandial:   less than 180 mg/dL (1-2 hours)      Critically ill patients:  140 - 180 mg/dL   Lab Results  Component Value Date   GLUCAP 210 (H) 07/22/2018   HGBA1C 9.2 (H) 12/30/2017    Review of Glycemic Control Results for Mallory Stewart, Mallory Stewart (MRN [id number]) as of 07/22/2018 11:32  Ref. Range 07/21/2018 21:29 07/21/2018 22:24 07/21/2018 23:27 07/22/2018 04:16  Glucose-Capillary Latest Ref Range: 70 - 99 mg/dL 301 (H) 601 (H) 093 (H) 210 (H)   Diabetes history:T2DM Outpatient Diabetes medications:Metformin 1000 mg QAM, 500 mg QHS Current orders for Inpatient glycemic control:Lantus 12 units BID, Novolog 0-9 units Q4H  Inpatient Diabetes Program Recommendations:  Noted transition following IV insulin and increased glucose trends. Consider increasing Lantus to 14 units BID.  Given steadily rising A1c since 2016 and last result in 2019, consider repeat A1c to determine recent glycemic control.  Addendum@1505 : Spoke with patient regarding outpatient diabetes management. Verified patient was taking Metformin, however MD encouraged her to hold med in the event she was vomiting. Patient checks CBGs once per day as FSBG.   Explained what a A1c is and what it measures. Also reviewed goal A1c with patient, importance of good glucose control @ home, and blood sugar goals. Reviewed patho of DM, DKA, need for insulin, current inpatient insulin needs, role of pancreas and beta cells, weight loss impact with possible poor control, vascular changes, differences between long acting vs short acting, and co-morbidies. A1C is scheduled from draw in aM, patient informed.  Has necessary supplies for checking. Discussed diet has been poor lately due to more frequent travel for her job. Reviewed and encouraged to be mindful of  nutritional intake and to eliminate unneccessary carbohydrates. Will plan to see patient again on 3/18 once A1C is resulted and formulate plan for discharge. Anticipate based on current inpatient needs, will need insulin outpatient.    Thanks, Lujean Rave, MSN, RNC-OB Diabetes Coordinator [phone] (8a-5p)

## 2018-07-22 NOTE — Progress Notes (Signed)
Patient transferred form 2H. Alert and oriented to room and surroundings. VSS. Telemetry applied and verified with two staff. Will continue to monitor.

## 2018-07-22 NOTE — Progress Notes (Signed)
NAME:  Mallory Stewart, MRN:  [id number], DOB:  03-Mar-1961, LOS: 1 ADMISSION DATE:  07/21/2018, CONSULTATION DATE:  07/21/2018 REFERRING MD:  Dr. Manus Gunning, CHIEF COMPLAINT: DKA  History of present illness   58 year old female presents to ED on 3/16 with reported Nausea/Vomiting for 3 days. Has been unable to take her medications during this time. On arrival HR 143, BP 148/102. ABG 7.039/16/55. Glucose 618. LA 4.7. Given 2L NS and started on insulin gtt.   Husband reported that patient has had significant weight loss over the last 2 months associated with poor appetite and weakness.   Past Medical History  DM, HTN, Pancreatitis, Cholecystectomy  Significant Hospital Events   3/16 Admit with weakness, decreased intake, N/V, DKA  Consults:  PCCM  Procedures:    Significant Diagnostic Tests:  CXR 3/16 > No active disease  CT Chest/A/P 3/16 > Abnormal appearance in the region of the distal stomach and 1st and 2nd portions of the duodenum. There appears to be some wall thickening in this area. The stomach is significantly distended with fluid and gas. In addition, there appears to be soft tissue fullness in the region of the pancreatic head. This area is difficult to evaluate with the lack of IV and oral contrast. Consider further evaluation with contrast enhanced CT with oral and IV contrast. This could be performed after NG tube placement to decompress the stomach. [place] ABD 3/15 > mild extrahepatic biliary dilation s/p cholecystectmy, nonvisualization of pancreas  Micro Data:  Blood 3/16 >>  Urine 3/16 >>  Antimicrobials:  Zosyn 3/16 >>   Interim history/subjective:  Pt reports feeling much better > hoping to have NGT removed.  Denies further vomiting.  Insulin gtt turned off, lantus given overnight.   Objective   Blood pressure (!) 150/106, pulse 83, temperature 98.3 F (36.8 C), temperature source Oral, resp. rate 17, height 5' 8.25" (1.734 m), weight 67.5 kg, SpO2 98 %.         Intake/Output Summary (Last 24 hours) at 07/22/2018 1048 Last data filed at 07/22/2018 0700 Gross per 24 hour  Intake 3821.92 ml  Output 2000 ml  Net 1821.92 ml   Filed Weights   07/21/18 0017 07/21/18 0500 07/22/18 0500  Weight: 78.8 kg 65 kg 67.5 kg    Examination: General: adult female lying in bed in NAD HEENT: MM pink/dry, NGT in place to LIS Neuro: AAOx4, speech clear, MAE CV: s1s2 rrr, no m/r/g PULM: even/non-labored, lungs bilaterally clear  GI: soft, non-tender to palpation, bsx4 active, minimal drainage from NGT Extremities: warm/dry, no edema  Skin: no rashes or lesions  Resolved Hospital Problem list   Tachycardia in setting of hypovolemia  Anion Gap Metabolic Acidosis with Lactic Acidosis   Assessment & Plan:   DKA DM II P: Increase lantus to 14 units BID  SSI, sensitive scale  Additional 2 units lantus now Assess Hgb A1c  Reduce D5 1/2NS to 56ml/hr until taking PO's   Nausea/Vomiting  -CT with thickening of stomach / duodenumand ?mass/fullness to pancreatic head  Hx of Pancreatitis  -prior ETOH use (2 glasses wine per day) up till 05/2018.  Limited to none since.  Gallstones in past with cholecystectomy 2 years ago.  72 lb weight loss in last 2-3 years. She was also recently on prednisone. P: Clamp NGT  Follow up LFT's, lipase Begin clear liquids as tolerated > if further n/v put patient back to suction  Reviewed images with [facility] > plan for diet as  above, ETOH abstinence & follow up with them as outpatient.  If pt eating / gaining weight, may not need further imaging.  Concern for low level pancreatitis with DKA, ETOH use, may be causing image findings vs other cause with weight loss.   Will need outpatient GI follow up, reviewed with patient and husband  HTN P: Consider transition to floor this evening if tolerates PO's  Continue IV lopressor 5mg  IV Q6 until taking PO's  Hold home ACE-I  PRN hydralazine   AKI  P: Trend BMP / urinary output  Replace electrolytes as indicated Avoid nephrotoxic agents, ensure adequate renal perfusion   Best practice:  Diet: Clear liquids DVT prophylaxis: Heparin SQ GI prophylaxis: PPI Glucose control: Lantus, SSI   Mobility: Bedrest  Code Status: Full Code  Family Communication: Patient and family updated at bedside 3/17 extensively  Labs   CBC: Recent Labs  Lab 07/21/18 0039 07/21/18 0058 07/21/18 1246  WBC 19.2*  --   --   NEUTROABS 16.9*  --   --   HGB 13.8 14.3 11.2*  HCT 46.4* 42.0 33.0*  MCV 100.9*  --   --   PLT 340  --   --     Basic Metabolic Panel: Recent Labs  Lab 07/21/18 0039  07/21/18 1212 07/21/18 1246 07/21/18 1620 07/21/18 1955 07/22/18 0054 07/22/18 0518  NA 143   < > 148* 149* 147* 143 140 139  K 3.9   < > 3.3* 3.1* 3.3* 3.7 3.4* 3.3*  CL 102   < > 110  --  110 109 107 105  CO2 <7*   < > 23  --  25 24 23  21*  GLUCOSE 618*   < > 172*  --  129* 196* 134* 302*  BUN 18   < > 14  --  14 13 11 11   CREATININE 1.83*   < > 1.07*  --  0.98 0.97 0.87 0.84  CALCIUM 9.9   < > 9.2  --  9.0 8.5* 8.3* 8.3*  MG 1.8  --   --   --   --   --   --   --    < > = values in this interval not displayed.   GFR: Estimated Creatinine Clearance: 77.1 mL/min (by C-G formula based on SCr of 0.84 mg/dL). Recent Labs  Lab 07/21/18 0039 07/21/18 0334 07/21/18 0532 07/21/18 0811 07/21/18 1212 07/22/18 0518  PROCALCITON  --  0.23  --   --   --  0.48  WBC 19.2*  --   --   --   --   --   LATICACIDVEN 4.7* 4.2* 3.3* 2.4* 2.5*  --     Liver Function Tests: Recent Labs  Lab 07/21/18 0039  AST 90*  ALT 210*  ALKPHOS 1,028*  BILITOT 2.7*  PROT 8.0  ALBUMIN 3.9   Recent Labs  Lab 07/21/18 0039  LIPASE 105*   No results for input(s): AMMONIA in the last 168 hours.  ABG    Component Value Date/Time   PHART 7.534 (H) 07/21/2018 1246   PCO2ART 32.9 07/21/2018 1246   PO2ART 83.0 07/21/2018 1246   HCO3 27.9 07/21/2018 1246   TCO2 29 07/21/2018 1246   ACIDBASEDEF  24.0 (H) 07/21/2018 0058   O2SAT 98.0 07/21/2018 1246     Coagulation Profile: No results for input(s): INR, PROTIME in the last 168 hours.  Cardiac Enzymes: Recent Labs  Lab 07/21/18 1212 07/21/18 1830 07/22/18 0054  TROPONINI 0.28* 0.28*  0.20*    HbA1C: Hgb A1c MFr Bld  Date/Time Value Ref Range Status  12/30/2017 08:06 AM 9.2 (H) 4.6 - 6.5 % Final    Comment:    Glycemic Control Guidelines for People with Diabetes:Non Diabetic:  <6%Goal of Therapy: <7%Additional Action Suggested:  >8%   07/02/2017 08:44 AM 6.9 (H) 4.6 - 6.5 % Final    Comment:    Glycemic Control Guidelines for People with Diabetes:Non Diabetic:  <6%Goal of Therapy: <7%Additional Action Suggested:  >8%     CBG: Recent Labs  Lab 07/21/18 2001 07/21/18 2129 07/21/18 2224 07/21/18 2327 07/22/18 0416  GLUCAP 181* 192* 169* 116* 210*    Critical care time: n/a      Canary Brim, NP-C [facility] Pgr: [phone] or if no answer [phone] 07/22/2018, 10:49 AM

## 2018-07-22 NOTE — Progress Notes (Signed)
Pt transferred to 2W10 on monitor. Pt ambulated to bathroom then bed. Vitals T 98.2, b/p 117/90, hr 87 SR, O2 sat 100% on RA. Pt ambulated to bed.Marland Kitchen

## 2018-07-22 NOTE — Plan of Care (Signed)
  Problem: Cardiac: Goal: Ability to maintain an adequate cardiac output will improve Outcome: Progressing  Cardiac WNL Problem: Nutritional: Goal: Maintenance of adequate nutrition will improve Outcome: Progressing  NPO Problem: Respiratory: Goal: Will regain and/or maintain adequate ventilation Outcome: Progressing  Adequate respiratory status Problem: Urinary Elimination: Goal: Ability to achieve and maintain adequate renal perfusion and functioning will improve Outcome: Progressing  Pt with adequate uop

## 2018-07-22 NOTE — Progress Notes (Addendum)
Initial Nutrition Assessment  DOCUMENTATION CODES:   Severe malnutrition in context of acute illness/injury  INTERVENTION:  Once Diet changes from NPO:   Encourage Po intake  Ensure Enlive po BID, each supplement provides 350 kcal and 20 grams of protein  Provide Diet education on follow-up as appropriate.    NUTRITION DIAGNOSIS:   Severe Malnutrition related to acute illness(DKA, recent flu) as evidenced by moderate fat depletion, mild muscle depletion, energy intake < or equal to 50% for > or equal to 5 days, percent weight loss.   GOAL:   Patient will meet greater than or equal to 90% of their needs   MONITOR:   Diet advancement, Labs, Weight trends  REASON FOR ASSESSMENT:   Malnutrition Screening Tool(MST 4)    ASSESSMENT:   Pt is a 34y F with PMH of DM (metformin), HTN, pancreatitis. Pt presents to ED for evaluation of generally not feeling well, stating since Wednesday she has had N/V/D (3x days) and has not taken any of her diabetes medicines as she has not been eating. Pt is now admitted for DKA with initial glucose of 618.   Pt states that appetite has been low for the last ~2 months but unable to contribute why its low. Pt states that back in Jan 2020 she had the flu and was out of it for nearly 10 days eating very little. Since last Wednesday 07/16/18, pt was feeling very N/V and was eating very little to nothing. A typical day's diet is banana or cereal, some days no breakfast. For lunch something simple like egg rolls or skipped, and dinner is a larger meal like grilled chicken or steak, vegetables, and some starch. Pt states she if gluten free, (states she is gluten intolerant), unless if it slips into her diet. Started gluten free diet to help with the numbness of her lower legs. Pt states it did not help with the numbness, but overall health reports feeling better without gluten. On days where appetite has low to nothing, she would eat bites of typical diet  resulting in 0-25% intake (pt reported). Pt states that some days are better than others for appetite, but reports having more bad vs good days.   Pt endorses weight loss, and doesn't have a current UBW. Pt states that she was intentionally losing weight, 50# intentional. Over the last ~61months, the latest 20# loss were unintentional. Current weight is 148.5#. Pt has reportedly loss 11.9% weight (unintentionally) over ~2 months significant for time.   Pt has no issues eating and is ambulatory at baseline, even with numbness in below knee extremities. Pt currently has NG suction tube for gastric decompression and fluid removal. CT of abdomin shows that the stomach is significantly distended with both fluid and gas. There is also some wall thickening in the distal stomach region and 1st and 2nd portions of the duodenum.   With reported intake at </=50% for > 5days, and significant wt % weight loss, with NFPE findings, pt is severely malnourished.   Labs reviewed:  Glucose 618 (admission) 302 (07/22/2018) HbA1c 9.2 (12/30/17)  Medications reviewed and include:  NovoLog 0-9units Lantus injections 12 units Dextrose 5% and 0.45 % NaCl 154mL/hr    NUTRITION - FOCUSED PHYSICAL EXAM:    Most Recent Value  Orbital Region  Mild depletion  Upper Arm Region  Moderate depletion  Thoracic and Lumbar Region  Moderate depletion  Buccal Region  Mild depletion  Temple Region  Mild depletion  Clavicle Bone Region  Mild  depletion  Clavicle and Acromion Bone Region  Mild depletion  Scapular Bone Region  Mild depletion  Dorsal Hand  Moderate depletion  Patellar Region  Mild depletion  Anterior Thigh Region  Mild depletion  Posterior Calf Region  Mild depletion  Edema (RD Assessment)  None  Hair  Reviewed  Eyes  Reviewed  Mouth  Reviewed  Skin  Reviewed  Nails  Reviewed       Diet Order:   Diet Order    None      EDUCATION NEEDS:   Not appropriate for education at this time  Skin:  Skin  Assessment: Reviewed RN Assessment  Last BM:  Unknown  Height:   Ht Readings from Last 1 Encounters:  07/21/18 5' 8.25" (1.734 m)    Weight:   Wt Readings from Last 1 Encounters:  07/22/18 67.5 kg    Ideal Body Weight:  64.2 kg  BMI:  Body mass index is 22.46 kg/m.  Estimated Nutritional Needs:   Kcal:  1700-2000  Protein:  85-100 grams  Fluid:  >/= 2L    Burnard Bunting, [facility] Dietetic Intern

## 2018-07-23 DIAGNOSIS — R112 Nausea with vomiting, unspecified: Secondary | ICD-10-CM

## 2018-07-23 DIAGNOSIS — E876 Hypokalemia: Secondary | ICD-10-CM

## 2018-07-23 DIAGNOSIS — R945 Abnormal results of liver function studies: Secondary | ICD-10-CM

## 2018-07-23 DIAGNOSIS — E1110 Type 2 diabetes mellitus with ketoacidosis without coma: Principal | ICD-10-CM

## 2018-07-23 DIAGNOSIS — E43 Unspecified severe protein-calorie malnutrition: Secondary | ICD-10-CM

## 2018-07-23 LAB — GLUCOSE, CAPILLARY
Glucose-Capillary: 298 mg/dL — ABNORMAL HIGH (ref 70–99)
Glucose-Capillary: 93 mg/dL (ref 70–99)
Glucose-Capillary: 93 mg/dL (ref 70–99)
Glucose-Capillary: 170 mg/dL — ABNORMAL HIGH (ref 70–99)
Glucose-Capillary: 246 mg/dL — ABNORMAL HIGH (ref 70–99)
Glucose-Capillary: 206 mg/dL — ABNORMAL HIGH (ref 70–99)
Glucose-Capillary: 231 mg/dL — ABNORMAL HIGH (ref 70–99)

## 2018-07-23 LAB — CBC
HCT: 32.6 % — ABNORMAL LOW (ref 36.0–46.0)
HEMOGLOBIN: 10.6 g/dL — AB (ref 12.0–15.0)
MCH: 29.9 pg (ref 26.0–34.0)
MCHC: 32.5 g/dL (ref 30.0–36.0)
MCV: 91.8 fL (ref 80.0–100.0)
NRBC: 0 % (ref 0.0–0.2)
Platelets: 105 10*3/uL — ABNORMAL LOW (ref 150–400)
RBC: 3.55 MIL/uL — AB (ref 3.87–5.11)
RDW: 11.9 % (ref 11.5–15.5)
WBC: 5.9 10*3/uL (ref 4.0–10.5)

## 2018-07-23 LAB — COMPREHENSIVE METABOLIC PANEL
ALT: 77 U/L — ABNORMAL HIGH (ref 0–44)
AST: 73 U/L — AB (ref 15–41)
Albumin: 2.3 g/dL — ABNORMAL LOW (ref 3.5–5.0)
Alkaline Phosphatase: 508 U/L — ABNORMAL HIGH (ref 38–126)
Anion gap: 9 (ref 5–15)
BUN: 5 mg/dL — ABNORMAL LOW (ref 6–20)
CO2: 23 mmol/L (ref 22–32)
Calcium: 7.9 mg/dL — ABNORMAL LOW (ref 8.9–10.3)
Chloride: 109 mmol/L (ref 98–111)
Creatinine, Ser: 0.76 mg/dL (ref 0.44–1.00)
GFR calc Af Amer: >60 mL/min (ref >60)
GFR calc non Af Amer: >60 mL/min (ref >60)
Glucose, Bld: 127 mg/dL — ABNORMAL HIGH (ref 70–99)
Potassium: 3.1 mmol/L — ABNORMAL LOW (ref 3.5–5.1)
Sodium: 141 mmol/L (ref 135–145)
Total Bilirubin: 0.9 mg/dL (ref 0.3–1.2)
Total Protein: 5.3 g/dL — ABNORMAL LOW (ref 6.5–8.1)

## 2018-07-23 LAB — HEMOGLOBIN A1C
Hgb A1c MFr Bld: 13.3 % — ABNORMAL HIGH (ref 4.8–5.6)
Mean Plasma Glucose: 335.01 mg/dL

## 2018-07-23 LAB — MAGNESIUM: Magnesium: 1 mg/dL — ABNORMAL LOW (ref 1.7–2.4)

## 2018-07-23 LAB — LIPASE, BLOOD: Lipase: 58 U/L — ABNORMAL HIGH (ref 11–51)

## 2018-07-23 LAB — PROCALCITONIN: Procalcitonin: 0.19 ng/mL

## 2018-07-23 MED ORDER — POTASSIUM CHLORIDE CRYS ER 20 MEQ PO TBCR
40.0000 meq | EXTENDED_RELEASE_TABLET | Freq: Once | ORAL | Status: AC
  Administered 2018-07-23: 40 meq via ORAL
  Filled 2018-07-23: qty 2

## 2018-07-23 MED ORDER — PANTOPRAZOLE SODIUM 40 MG PO TBEC
40.0000 mg | DELAYED_RELEASE_TABLET | Freq: Every day | ORAL | Status: DC
  Administered 2018-07-24: 40 mg via ORAL
  Filled 2018-07-23: qty 1

## 2018-07-23 MED ORDER — METOPROLOL TARTRATE 50 MG PO TABS
50.0000 mg | ORAL_TABLET | Freq: Two times a day (BID) | ORAL | Status: DC
  Administered 2018-07-23 – 2018-07-24 (×2): 50 mg via ORAL
  Filled 2018-07-23 (×2): qty 1

## 2018-07-23 MED ORDER — INSULIN STARTER KIT- PEN NEEDLES (ENGLISH)
1.0000 | Freq: Once | Status: AC
  Administered 2018-07-23: 1

## 2018-07-23 MED ORDER — MAGNESIUM SULFATE 4 GM/100ML IV SOLN
4.0000 g | Freq: Once | INTRAVENOUS | Status: AC
  Administered 2018-07-23: 4 g via INTRAVENOUS
  Filled 2018-07-23: qty 100

## 2018-07-23 MED ORDER — INSULIN ASPART 100 UNIT/ML ~~LOC~~ SOLN
0.0000 [IU] | Freq: Three times a day (TID) | SUBCUTANEOUS | Status: DC
  Administered 2018-07-23 – 2018-07-24 (×4): 3 [IU] via SUBCUTANEOUS

## 2018-07-23 NOTE — Progress Notes (Addendum)
PROGRESS NOTE    Mallory Stewart  OHY:[id number] DOB: 04-06-1961 DOA: 07/21/2018 PCP: [name], MD   Brief Narrative:  HPI on 07/21/2018 by Ms. Hayden Pedro, NP (PCCM) 58 year old female presents to ED on 3/16 with reported Nausea/Vomiting/Diarrhea for last 3 days. Has been unable to take her medications during this time. On arrival HR 143, BP 148/102. ABG 7.039/16/55. Glucose 618. LA 4.7. Given 2L NS and started on insulin gtt.   Husband reports that patient has had significant weight loss over the last 2 months associated with poor appetite and weakness.   Interim history Patient admitted with DKA which is now resolved.  She was admitted by PCCM, TRH is now assumed care. Assessment & Plan   DKA/diabetes mellitus, type II uncontrolled -Patient did require insulin drip, however was transitioned to Lantus and insulin sliding scale with CBG monitoring -Hemoglobin A1c 13.3 -Diabetes coordinator consulted and appreciated -Suspect DKA likely secondary to noncompliance with medications, patient admits to not taking her medications for approximately 5 days as she was not feeling well.  Nausea, vomiting with history of pancreatitis -CT abdomen pelvis showed thickening of the stomach, duodenum/questionable mass or fullness to the pancreatic head -Patient did have history of alcohol use, 2 glasses of wine per day up until January 2020 -She has had gallstones in the past with cholecystectomy approximately 2 years ago -72 pound weight loss in the last 2 to 3 years -She states she was not on prednisone however this was prescribed to her for pain -Currently she denies any further nausea or vomiting, denies any diarrhea. -Continue diet along with antiemetics as needed. -Of note, images were reviewed with equal and GI by PCCM.  Follow-up with them as an outpatient, alcohol abstinence.  Essential hypertension -Will discontinue IV Lopressor and place on home metoprolol 50 mg twice daily  -Home lisinopril held -Continue hydralazine as needed  Acute kidney injury -Creatinine was 1.83 on admission, suspect secondary to the above -Has not resolved and currently down to 0.76 -Continue to monitor  Hypokalemia/hypomagnesemia -Replacing continue to monitor  Elevated LFTs -Likely secondary to the above, trending downward  Severe malnutrition -Likely secondary to acute illness, continue feeding supplementation -Nutrition consulted  DVT Prophylaxis  heparin  Code Status: Full  Family Communication: None at bedside  Disposition Plan: Admitted. Pending improvement. Possibly home in 1-2 days.  Consultants US Abdomen  Procedures  None  Antibiotics   Anti-infectives (From admission, onward)   Start     Dose/Rate Route Frequency Ordered Stop   07/21/18 0330  piperacillin-tazobactam (ZOSYN) IVPB 3.375 g  Status:  Discontinued     3.375 g 12.5 mL/hr over 240 Minutes Intravenous Every 8 hours 07/21/18 0256 07/21/18 0456      Subjective:   Mallory Stewart seen and examined today.  Patient states she is feeling better.  No further nausea or vomiting, abdominal pain.  Denies any diarrhea.  Denies chest pain, shortness of breath, dizziness or headache. Objective:   Vitals:   07/22/18 2312 07/23/18 0329 07/23/18 0500 07/23/18 0814  BP: (!) 136/106 134/84  (!) 144/96  Pulse: 82 83  85  Resp: 14   16  Temp: 98.4 F (36.9 C) 98.5 F (36.9 C)  99 F (37.2 C)  TempSrc: Oral Oral  Oral  SpO2: 100% 100%  100%  Weight:   68 kg   Height:        Intake/Output Summary (Last 24 hours) at 07/23/2018 1535 Last data filed at 07/22/2018 1900  Gross per 24 hour  Intake 742.59 ml  Output 250 ml  Net 492.59 ml   Filed Weights   07/21/18 0500 07/22/18 0500 07/23/18 0500  Weight: 65 kg 67.5 kg 68 kg    Exam  General: Well developed, well nourished, NAD, appears stated age  54: NCAT, mucous membranes moist.   Neck: Supple  Cardiovascular: S1 S2 auscultated, RRR,  no murmur  Respiratory: Clear to auscultation bilaterally with equal chest rise  Abdomen: Soft, nontender, nondistended, + bowel sounds  Extremities: warm dry without cyanosis clubbing or edema  Neuro: AAOx3, nonfocal  Psych: Appropriate mood and affect    Data Reviewed: I have personally reviewed following labs and imaging studies  CBC: Recent Labs  Lab 07/21/18 0039 07/21/18 0058 07/21/18 1246 07/23/18 0230  WBC 19.2*  --   --  5.9  NEUTROABS 16.9*  --   --   --   HGB 13.8 14.3 11.2* 10.6*  HCT 46.4* 42.0 33.0* 32.6*  MCV 100.9*  --   --  91.8  PLT 340  --   --  619*   Basic Metabolic Panel: Recent Labs  Lab 07/21/18 0039  07/21/18 1955 07/22/18 0054 07/22/18 0518 07/22/18 1044 07/23/18 0230  NA 143   < > 143 140 139 138 141  K 3.9   < > 3.7 3.4* 3.3* 2.9* 3.1*  CL 102   < > 109 107 105 106 109  CO2 <7*   < > 24 23 21* 24 23  GLUCOSE 618*   < > 196* 134* 302* 277* 127*  BUN 18   < > _0 5*  CREATININE 1.83*   < > 0.97 0.87 0.84 0.82 0.76  CALCIUM 9.9   < > 8.5* 8.3* 8.3* 8.0* 7.9*  MG 1.8  --   --   --   --   --  1.0*   < > = values in this interval not displayed.   GFR: Estimated Creatinine Clearance: 80.9 mL/min (by C-G formula based on SCr of 0.76 mg/dL). Liver Function Tests: Recent Labs  Lab 07/21/18 0039 07/23/18 0230  AST 90* 73*  ALT 210* 77*  ALKPHOS 1,028* 508*  BILITOT 2.7* 0.9  PROT 8.0 5.3*  ALBUMIN 3.9 2.3*   Recent Labs  Lab 07/21/18 0039 07/23/18 0230  LIPASE 105* 58*   No results for input(s): AMMONIA in the last 168 hours. Coagulation Profile: No results for input(s): INR, PROTIME in the last 168 hours. Cardiac Enzymes: Recent Labs  Lab 07/21/18 1212 07/21/18 1830 07/22/18 0054  TROPONINI 0.28* 0.28* 0.20*   BNP (last 3 results) No results for input(s): PROBNP in the last 8760 hours. HbA1C: Recent Labs    07/23/18 0230  HGBA1C 13.3*   CBG: Recent Labs  Lab 07/22/18 2021 07/22/18 2311 07/23/18 0312  07/23/18 0810 07/23/18 1251  GLUCAP 235* 233* 93 93 170*   Lipid Profile: No results for input(s): CHOL, HDL, LDLCALC, TRIG, CHOLHDL, LDLDIRECT in the last 72 hours. Thyroid Function Tests: No results for input(s): TSH, T4TOTAL, FREET4, T3FREE, THYROIDAB in the last 72 hours. Anemia Panel: No results for input(s): VITAMINB12, FOLATE, FERRITIN, TIBC, IRON, RETICCTPCT in the last 72 hours. Urine analysis:    Component Value Date/Time   COLORURINE YELLOW 07/26/2016 0933   APPEARANCEUR CLEAR 07/26/2016 0933   APPEARANCEUR Hazy 04/07/2013 2105   LABSPEC >1.046 (H) 07/26/2016 0933   LABSPEC 1.020 04/07/2013 2105   PHURINE 6.0 07/26/2016 0933   GLUCOSEU 50 (A) 07/26/2016  0933   GLUCOSEU 150 mg/dL 04/07/2013 2105   HGBUR NEGATIVE 07/26/2016 0933   HGBUR negative 11/03/2008 0822   BILIRUBINUR NEGATIVE 07/26/2016 0933   BILIRUBINUR Negative 04/07/2013 2105   KETONESUR 5 (A) 07/26/2016 0933   PROTEINUR NEGATIVE 07/26/2016 0933   UROBILINOGEN 0.2 11/03/2008 0822   NITRITE NEGATIVE 07/26/2016 0933   LEUKOCYTESUR NEGATIVE 07/26/2016 0933   LEUKOCYTESUR Negative 04/07/2013 2105   Sepsis Labs: _0 (procalcitonin:4,lacticidven:4)  ) Recent Results (from the past 240 hour(s))  Culture, blood (routine x 2)     Status: None (Preliminary result)   Collection Time: 07/21/18  1:20 AM  Result Value Ref Range Status   Specimen Description BLOOD RIGHT HAND  Final   Special Requests   Final    BOTTLES DRAWN AEROBIC AND ANAEROBIC Blood Culture results may not be optimal due to an inadequate volume of blood received in culture bottles   Culture   Final    NO GROWTH 2 DAYS Performed at [facility], [place] [address], [place], [place] [address]    Report Status PENDING  Incomplete  Culture, blood (routine x 2)     Status: None (Preliminary result)   Collection Time: 07/21/18  1:35 AM  Result Value Ref Range Status   Specimen Description BLOOD LEFT HAND  Final   Special Requests    Final    BOTTLES DRAWN AEROBIC AND ANAEROBIC Blood Culture results may not be optimal due to an inadequate volume of blood received in culture bottles   Culture   Final    NO GROWTH 2 DAYS Performed at [facility], [place] [address] [place], [place] [address]    Report Status PENDING  Incomplete  MRSA PCR Screening     Status: None   Collection Time: 07/21/18  6:44 AM  Result Value Ref Range Status   MRSA by PCR NEGATIVE NEGATIVE Final    Comment:        The GeneXpert MRSA Assay (FDA approved for NASAL specimens only), is one component of a comprehensive MRSA colonization surveillance program. It is not intended to diagnose MRSA infection nor to guide or monitor treatment for MRSA infections. Performed at [facility], [place] [address], [place],  [address]       Radiology Studies: No results found.   Scheduled Meds: . heparin  5,000 Units Subcutaneous Q8H  . insulin aspart  0-9 Units Subcutaneous Q4H  . insulin glargine  14 Units Subcutaneous Q12H  . insulin starter kit- pen needles  1 kit Other Once  . metoprolol tartrate  50 mg Oral BID  . pantoprazole  40 mg Oral Daily   Continuous Infusions: . dextrose 5 % and 0.45% NaCl 75 mL/hr at 07/23/18 0328  . magnesium sulfate 1 - 4 g bolus IVPB       LOS: 2 days   Time Spent in minutes   30 minutes  Florenda Watt D.O. on 07/23/2018 at 3:35 PM  Between 7am to 7pm - Please see pager noted on [email or web address]  After 7pm go to [email or web address]  And look for the night coverage person covering for me after hours  [facility] Office  [phone]

## 2018-07-23 NOTE — Progress Notes (Addendum)
Inpatient Diabetes Program Recommendations  AACE/ADA: New Consensus Statement on Inpatient Glycemic Control (2015)  Target Ranges:  Prepandial:   less than 140 mg/dL      Peak postprandial:   less than 180 mg/dL (1-2 hours)      Critically ill patients:  140 - 180 mg/dL   Lab Results  Component Value Date   GLUCAP 93 07/23/2018   HGBA1C 13.3 (H) 07/23/2018    Review of Glycemic Control Results for Mallory Stewart, Mallory Stewart (MRN [id number]) as of 07/23/2018 10:28  Ref. Range 07/22/2018 20:21 07/22/2018 23:11 07/23/2018 03:12 07/23/2018 08:10  Glucose-Capillary Latest Ref Range: 70 - 99 mg/dL 235 (H) 233 (H) 93 93   Diabetes history:T2DM Outpatient Diabetes medications:Metformin 1000 mg QAM, 500 mg QHS Current orders for Inpatient glycemic control:Lantus 14 units BID, Novolog 0-9 units Q4H  Inpatient Diabetes Program Recommendations:  Patient now has diet order placed. Consider changing correction to Novolog 0-9 units TID & HS.  Addendum_0 : Spoke to patient and husband again for outpatient DM management.  Reviewed patient's current A1c of 13.3%, which was up from 9.4%. Explained what a A1c is and what it measures. Also reviewed goal A1c with patient, importance of good glucose control @ home, and blood sugar goals. Reviewed again patho of DM, DKA, need for insulin, current inpatient insulin needs, role of pancreas and beta cells, weight loss impact with poor glycemic control, survival skills, vascular changes, differences between long acting vs short acting, and co-morbidities.  Patient is requesting another meter at discharge. Please order blood glucose meter kit (includes lancets and strips) ([id number]). Encouraged to begin checking 4 times per day and to keep log of results in journal. Patient has many questions related to diet with regard to gluten. Reviewed carb counting, total carbohydrates and compared nutritional labels to gluten containing foods. Dietitian consult also placed and  encouraged to consider carb intake if nutritional supplements are recommended. Will also place consult for outpatient education.  Educated patient and spouse on insulin pen use at home. Reviewed contents of insulin flexpen starter kit. Reviewed all steps if insulin pen including attachment of needle, 2-unit air shot, dialing up dose, giving injection, removing needle, disposal of sharps, storage of unused insulin, disposal of insulin etc. Patient able to provide successful return demonstration. Also reviewed troubleshooting with insulin pen. MD to give patient Rxs for insulin pens and insulin pen needles [phone]). Reviewed the importance for patient to call insurance company to determine preferred insulins, supplies, and eligibility based on tiers. Patient plans to get this information today as well as make appointment with endocrinologist. List provided. Will continue to follow back up on preferred insulin to make recs at discharge. Patient has no further questions at this time.  Thanks, Mallory Curb, MSN, RNC-OB Diabetes Coordinator [phone] (8a-5p)

## 2018-07-23 NOTE — Plan of Care (Signed)

## 2018-07-24 DIAGNOSIS — I10 Essential (primary) hypertension: Secondary | ICD-10-CM

## 2018-07-24 LAB — COMPREHENSIVE METABOLIC PANEL
ALT: 56 U/L — ABNORMAL HIGH (ref 0–44)
AST: 38 U/L (ref 15–41)
Albumin: 2.3 g/dL — ABNORMAL LOW (ref 3.5–5.0)
Alkaline Phosphatase: 421 U/L — ABNORMAL HIGH (ref 38–126)
Anion gap: 8 (ref 5–15)
BUN: 7 mg/dL (ref 6–20)
CO2: 23 mmol/L (ref 22–32)
CREATININE: 0.54 mg/dL (ref 0.44–1.00)
Calcium: 7.9 mg/dL — ABNORMAL LOW (ref 8.9–10.3)
Chloride: 107 mmol/L (ref 98–111)
GFR calc Af Amer: >60 mL/min (ref >60)
GFR calc non Af Amer: >60 mL/min (ref >60)
Glucose, Bld: 172 mg/dL — ABNORMAL HIGH (ref 70–99)
Potassium: 3 mmol/L — ABNORMAL LOW (ref 3.5–5.1)
Sodium: 138 mmol/L (ref 135–145)
Total Bilirubin: 0.7 mg/dL (ref 0.3–1.2)
Total Protein: 5.1 g/dL — ABNORMAL LOW (ref 6.5–8.1)

## 2018-07-24 LAB — GLUCOSE, CAPILLARY
Glucose-Capillary: 105 mg/dL — ABNORMAL HIGH (ref 70–99)
Glucose-Capillary: 213 mg/dL — ABNORMAL HIGH (ref 70–99)
Glucose-Capillary: 207 mg/dL — ABNORMAL HIGH (ref 70–99)

## 2018-07-24 LAB — MAGNESIUM: Magnesium: 1.9 mg/dL (ref 1.7–2.4)

## 2018-07-24 LAB — POTASSIUM: Potassium: 4.3 mmol/L (ref 3.5–5.1)

## 2018-07-24 MED ORDER — GLUCERNA SHAKE PO LIQD: 237.0000 mL | Freq: Three times a day (TID) | ORAL | Status: DC

## 2018-07-24 MED ORDER — POTASSIUM CHLORIDE CRYS ER 20 MEQ PO TBCR
40.0000 meq | EXTENDED_RELEASE_TABLET | Freq: Once | ORAL | Status: AC
  Administered 2018-07-24: 40 meq via ORAL
  Filled 2018-07-24: qty 2

## 2018-07-24 MED ORDER — PEN NEEDLES 31G X 5 MM MISC: 1.0000 "application " | Freq: Every day | 0 refills | Status: DC

## 2018-07-24 MED ORDER — POTASSIUM CHLORIDE 20 MEQ/15ML (10%) PO SOLN
40.0000 meq | ORAL | Status: AC
  Administered 2018-07-24 (×2): 40 meq via ORAL
  Filled 2018-07-24 (×2): qty 30

## 2018-07-24 MED ORDER — GLUCERNA SHAKE PO LIQD: 237.0000 mL | Freq: Three times a day (TID) | ORAL | 0 refills | Status: DC

## 2018-07-24 MED ORDER — INSULIN DEGLUDEC 200 UNIT/ML ~~LOC~~ SOPN: 28.0000 [IU] | PEN_INJECTOR | Freq: Every morning | SUBCUTANEOUS | 1 refills | Status: DC

## 2018-07-24 MED ORDER — ADULT MULTIVITAMIN W/MINERALS CH: 1.0000 | ORAL_TABLET | Freq: Every day | ORAL | Status: DC

## 2018-07-24 NOTE — Progress Notes (Addendum)
Inpatient Diabetes Program Recommendations  AACE/ADA: New Consensus Statement on Inpatient Glycemic Control (2015)  Target Ranges:  Prepandial:   less than 140 mg/dL      Peak postprandial:   less than 180 mg/dL (1-2 hours)      Critically ill patients:  140 - 180 mg/dL   Lab Results  Component Value Date   GLUCAP 213 (H) 07/24/2018   HGBA1C 13.3 (H) 07/23/2018    Review of Glycemic Control  Results for Mallory, Stewart (MRN [id number]) as of 07/24/2018 14:41  Ref. Range 07/23/2018 21:49 07/24/2018 07:40 07/24/2018 11:30  Glucose-Capillary Latest Ref Range: 70 - 99 mg/dL 231 (H) 105 (H) 213 (H)   Diabetes history:T2DM Outpatient Diabetes medications:Metformin 1000 mg QAM, 500 mg QHS Current orders for Inpatient glycemic control:Lantus 14 units BID, Novolog 0-9 units TID [facility]  Inpatient Diabetes Program Recommendations:    Spoke with patient and reviewed plan for home insulin on discharge. Educated patient on insulin pen use at home. Reviewed contents of insulin flexpen starter kit. Reviewed all steps of insulin pen including attachment of needle, 2-unit air shot, dialing up dose, giving injection, removing needle, disposal of sharps, storage of unused insulin, disposal of insulin etc. Patient was again able to provide successful return demonstration. Also reviewed troubleshooting with insulin pen. MD to give patient Rxs for insulin pens and insulin pen needles.   Spoke w/ Dr. Ree Kida and discussed discharged orders. Will plan for Tresiba 28 units daily on discharge to start in AM of 07/25/2018. Patient will plan to take Tresiba in AM and monitor CBGs QID. Discussed keeping home CBG log and taking it to her hospital f/u w/ PCP. Reviewed survival skills again with patient, verbalized understanding. Patient willing to self inject QD, copay card given, and answered additional questions. Also recommended that she schedule an appointment with an endocrinologist, as she may have to wait a few  months for appointment. Patient verbalized understanding and agrees with plan.  Thanks, Dorrene German, RN, BSN [employer] MSN Student

## 2018-07-24 NOTE — TOC Benefit Eligibility Note (Signed)
Transition of Care [facility]) Benefit Eligibility Note    Patient Details  Name: Mallory Stewart MRN: [id number] Date of Birth: 1960/10/20   Medication/Dose: Lantus 14u BID  Covered?: No     Prescription Coverage Preferred Pharmacy: [facility]  Spoke with Person/Company/Phone Number:: [employer]  Co-Pay: NOT COVERED        Additional Notes: Levemir, Basaglar, and Evaristo Bury are all covered alternitives covered for $25 for 30 day supply    [employer] Phone Number: 07/24/2018, 12:17 PM

## 2018-07-24 NOTE — Progress Notes (Signed)
Nutrition Follow-up  DOCUMENTATION CODES:   Severe malnutrition in context of acute illness/injury  INTERVENTION:   -Glucerna Shake po TID, each supplement provides 220 kcal and 10 grams of protein -MVI with minerals daily -Manager's check on all trays -Educated pt on gluten-free, carb modified diet; provided "Carbohydrate Counting For People with Diabetes" handout from [facility] Nutrition Care Manual. Also provided RD contact information- pt to be referred to local endocrinologist and [facility] for further follow-up  NUTRITION DIAGNOSIS:   Severe Malnutrition related to acute illness(DKA, recent flu) as evidenced by moderate fat depletion, mild muscle depletion, energy intake < or equal to 50% for > or equal to 5 days, percent weight loss.  Ongoing  GOAL:   Patient will meet greater than or equal to 90% of their needs  Progressing  MONITOR:   PO intake, Supplement acceptance, Diet advancement, Labs, Weight trends, Skin, I & O's  REASON FOR ASSESSMENT:   Consult Diet education  ASSESSMENT:   Pt is a 80y F with PMH of DM (metformin), HTN, pancreatitis. Pt presents to ED for evaluation of generally not feeling well, stating since Wednesday she has had N/V/D (3x days) and has not taken any of her diabetes medicines as she has not been eating. Pt is now admitted for DKA with initial glucose of 618.   3/17- insulin drip d/c, transferred from ICU to PCU  Reviewed I/O's: 339 ml x 24 hours and +7 L since admission  Spoke with pt at bedside, who was in good spirits today. She shares that she is feeling better and her appetite is returning. Her biggest complaint is that multiple meal trays have been incorrect; she follows a gluten-free diet secondary to gluten intolerance. Pt reports that her intake has been variable over the past 3 months (Breakfast: homemade smoothie with frozen mango, berries, plain yogurt, splash of almond milk, and agave  nectar Or banana; Lunch: fruit; Dinner: pho OR meat, starch, and vegetable). Pt shares that she travels a lot for work and relies on [employer] while traveling. She shares there would often be several days where she would prepare meals, but not have the desire to eat them and consume only a banana for the entire day.   Pt reports concern over her overall health and is motivated to improve her DM. She shares that she feel comfortable about transitioning to insulin and giving her own injections. She also reports intention to follow-up with endocrinology and outpatient diabetes education services, which this RD highly encouraged. Pt shares that she attended outpatient diabetes education courses in the past when she was first diagnosed, but eager to try again.   Pt shares she tries to eat healthfully, but finds this challenging during busy times or when she is travelling. Discussed more healthful items available to pt when dining out and carefully selecting gluten-free products, as many have added, fat, sugar, and salt to become more palatable. Also recommended pt measure out portions in smoothies to be more mindful of carbohydrate intake. Reviewed importance of overall diabetes self-management, consistent meals intake (including carbohydrate and protein) to optimize glycemic control. Pt shares she intends to checks her CBGS more often, as she was only checking fAM fasting levels PTA. Teachback method used.   Pt amenable to Glucerna shakes for additional calories and protein. Also added manager's check with meals to assist with tray accuracy. Pt very appreciative of visit.   Lab Results  Component Value Date   HGBA1C 13.3 (H)  07/23/2018   PTA DM medications are 500 metformin q AM and 1000 mg metformin q HS.   Labs reviewed: K: 3.0, Mg WDL, CBGS: 105-231 (inpatient orders for glycemic control are 0-9 units insulin aspart QID and 14 units insulin glargine every 12 hours).   Diet Order:   Diet Order             Diet heart healthy/carb modified Room service appropriate? Yes; Fluid consistency: Thin  Diet effective now              EDUCATION NEEDS:   Education needs have been addressed  Skin:  Skin Assessment: Reviewed RN Assessment  Last BM:  PTA  Height:   Ht Readings from Last 1 Encounters:  07/21/18 5' 8.25" (1.734 m)    Weight:   Wt Readings from Last 1 Encounters:  07/24/18 70.7 kg    Ideal Body Weight:  64.2 kg  BMI:  Body mass index is 23.53 kg/m.  Estimated Nutritional Needs:   Kcal:  1700-2000  Protein:  85-100 grams  Fluid:  >/= 2L    Susanne Baumgarner A. Mayford Knife, RD, LDN, CDCES Registered Dietitian II Certified Diabetes Care and Education Specialist Pager: [phone] After hours Pager: [phone]

## 2018-07-24 NOTE — Progress Notes (Signed)
Patient was stable at discharge. I removed their IV. We reviewed the discharge education. Patient/Family verbalized understanding and had no further questions. Patient left with prescription/s in hand.  

## 2018-07-24 NOTE — Discharge Summary (Signed)
Physician Discharge Summary  Mallory Stewart WNU:[id number] DOB: [birth year] DOA: 07/21/2018  PCP: Excell Seltzer, MD  Admit date: 07/21/2018 Discharge date: 07/24/2018  Time spent: 45 minutes  Recommendations for Outpatient Follow-up:  Patient will be discharged to home.  Patient will need to follow up with primary care provider within one week of discharge and discuss diabetes management and repeat CMP and magnesium.  Patient should continue medications as prescribed.  Patient should follow a carb modified diet.   Discharge Diagnoses:  DKA/diabetes mellitus, type II uncontrolled Nausea, vomiting with history of pancreatitis Essential hypertension Acute kidney injury Hypokalemia/hypomagnesemia Elevated LFTs Severe malnutrition  Discharge Condition: Stable  Diet recommendation: carb modified  Filed Weights   07/22/18 0500 07/23/18 0500 07/24/18 0500  Weight: 67.5 kg 68 kg 70.7 kg    History of present illness:  on 07/21/2018 by Ms. Jovita Kussmaul, NP (PCCM) 58 year old female presents to ED on 3/16 with reported Nausea/Vomiting/Diarrhea for last 3 days. Has been unable to take her medications during this time. On arrival HR 143, BP 148/102. ABG 7.039/16/55. Glucose 618. LA 4.7. Given 2L NS and started on insulin gtt.   Husband reports that patient has had significant weight loss over the last 2 months associated with poor appetite and weakness.  Hospital Course:  DKA/diabetes mellitus, type II uncontrolled -Patient did require insulin drip, however was transitioned to Lantus and insulin sliding scale with CBG monitoring -Hemoglobin A1c 13.3 -Diabetes coordinator consulted and appreciated- discussed in depth- patient does not really want to be on insulin for a long time. Will discharge patient with Evaristo Bury 28u daily with close follow up with PCP/endocrinologist  -Suspect DKA likely secondary to noncompliance with medications, patient admits to not taking her medications for  approximately 5 days as she was not feeling well. -Discussed patient with her PCP, Dr. Ermalene Searing  Nausea, vomiting with history of pancreatitis -CT abdomen pelvis showed thickening of the stomach, duodenum/questionable mass or fullness to the pancreatic head -Patient did have history of alcohol use, 2 glasses of wine per day - however states she does not drink on a daily basis  -She has had gallstones in the past with cholecystectomy approximately 2 years ago -72 pound weight loss in the last 2 to 3 years -She states she was not on prednisone however this was prescribed to her for pain -Currently she denies any further nausea or vomiting, denies any diarrhea. -Continue diet along with antiemetics as needed. -Of note, images were reviewed with equal and GI by PCCM.  Follow-up with them as an outpatient, alcohol abstinence. -discussed with patient the need for abstinence and how alcohol can affect her sugars  Essential hypertension -Will discontinue IV Lopressor and place on home metoprolol 50 mg twice daily -Home lisinopril held- may resume on discharge  Acute kidney injury -Creatinine was 1.83 on admission, suspect secondary to the above -Has not resolved and currently down to 0.76  Hypokalemia/hypomagnesemia -resolved with replacement -repeat in one week  Elevated LFTs -Likely secondary to the above, trending downward -repeat CMP in one week  Severe malnutrition -Likely secondary to acute illness, continue feeding supplementation -Nutrition consulted  Consultants US Abdomen  Procedures  None  Discharge Exam: Vitals:   07/24/18 0741 07/24/18 0832  BP: (!) 132/95 (!) 134/94  Pulse: 86 88  Resp:    Temp: 98.3 F (36.8 C) 98.2 F (36.8 C)  SpO2: 100% 100%     General: Well developed, well nourished, NAD, appears stated age  HEENT: NCAT,  mucous membranes moist.  Cardiovascular: S1 S2 auscultated, RRR  Respiratory: Clear to auscultation bilaterally with equal  chest rise  Abdomen: Soft, nontender, nondistended, + bowel sounds  Extremities: warm dry without cyanosis clubbing or edema  Neuro: AAOx3, nonfocal  Psych: Appropriate mood and affect  Discharge Instructions Discharge Instructions    Ambulatory referral to Nutrition and Diabetic Education   Complete by:  As directed    Discharge instructions   Complete by:  As directed    Patient will be discharged to home.  Patient will need to follow up with primary care provider within one week of discharge and discuss diabetes management and repeat CMP and magnesium.  Patient should continue medications as prescribed.  Patient should follow a carb modified diet.     Allergies as of 07/24/2018      Reactions   Gluten Meal    Simvastatin    Elevated lfts?      Medication List    STOP taking these medications   cyclobenzaprine 10 MG tablet Commonly known as:  FLEXERIL   metFORMIN 500 MG 24 hr tablet Commonly known as:  GLUCOPHAGE-XR   predniSONE 20 MG tablet Commonly known as:  DELTASONE     TAKE these medications   feeding supplement (GLUCERNA SHAKE) Liqd Take 237 mLs by mouth 3 (three) times daily between meals.   gabapentin 300 MG capsule Commonly known as:  NEURONTIN Take 4 capsules (1,200 mg total) by mouth at bedtime.   Insulin Degludec 200 UNIT/ML Sopn Commonly known as:  [place] FlexTouch Inject 28 Units into the skin every morning for 30 days.   lisinopril 40 MG tablet Commonly known as:  PRINIVIL,ZESTRIL Take 1 tablet (40 mg total) by mouth daily.   metoprolol tartrate 50 MG tablet Commonly known as:  LOPRESSOR TAKE 1 TABLET BY MOUTH TWICE A DAY   multivitamin with minerals Tabs tablet Take 1 tablet by mouth daily. Start taking on:  July 25, 2018   ONE TOUCH ULTRA TEST test strip Generic drug:  glucose blood CHECK BLOOD SUGAR TWO TIMES DAILY AS DIRECTED   Pen Needles 31G X 5 MM Misc 1 application by Does not apply route daily.      Allergies    Allergen Reactions   Gluten Meal    Simvastatin     Elevated lfts?   Follow-up Information    Excell Seltzer, MD Follow up.   Specialty:  Family Medicine Contact information: [address] [place] [address] [phone]            The results of significant diagnostics from this hospitalization (including imaging, microbiology, ancillary and laboratory) are listed below for reference.    Significant Diagnostic Studies: Ct Abdomen Pelvis Wo Contrast  Result Date: 07/21/2018 CLINICAL DATA:  Weight loss, nausea, vomiting, abnormal LFTs. EXAM: CT CHEST, ABDOMEN AND PELVIS WITHOUT CONTRAST TECHNIQUE: Multidetector CT imaging of the chest, abdomen and pelvis was performed following the standard protocol without IV contrast. COMPARISON:  CT abdomen and pelvis 07/26/2016 FINDINGS: CT CHEST FINDINGS Cardiovascular: Scattered aortic and coronary artery calcifications. Heart is normal size. Aorta is normal caliber. Mediastinum/Nodes: No mediastinal, hilar, or axillary adenopathy. Lungs/Pleura: No confluent opacities or effusions. Musculoskeletal: Chest wall soft tissues are unremarkable. No acute bony abnormality. CT ABDOMEN PELVIS FINDINGS Hepatobiliary: No focal liver abnormality is seen. Status post cholecystectomy. No biliary dilatation. Pancreas: Appears to be atrophy of the pancreatic body and tail. There is fullness in the region the pancreatic head, difficult to visualize  well and separate from the adjacent duodenum on this noncontrast study. Spleen: No focal abnormality.  Normal size. Adrenals/Urinary Tract: No adrenal abnormality. No focal renal abnormality. No stones or hydronephrosis. Urinary bladder is unremarkable. Stomach/Bowel: There is significant gas and fluid distention of the stomach. The distal stomach and proximal duodenum are difficult to evaluate with lack of oral and IV contrast. Cannot exclude wall thickening in this area. Remainder of the small bowel is  decompressed. Large bowel unremarkable. Appendix normal. Vascular/Lymphatic: Aortic atherosclerosis. No enlarged abdominal or pelvic lymph nodes. Reproductive: Uterus and adnexa unremarkable.  No mass. Other: No free fluid or free air. Musculoskeletal: No acute bony abnormality. IMPRESSION: Abnormal appearance in the region of the distal stomach and 1st and 2nd portions of the duodenum. There appears to be some wall thickening in this area. The stomach is significantly distended with fluid and gas. In addition, there appears to be soft tissue fullness in the region of the pancreatic head. This area is difficult to evaluate with the lack of IV and oral contrast. Consider further evaluation with contrast enhanced CT with oral and IV contrast. This could be performed after NG tube placement to decompress the stomach. Coronary artery disease. No acute cardiopulmonary disease. Electronically Signed   By: Charlett Nose M.D.   On: 07/21/2018 03:25   Ct Chest Wo Contrast  Result Date: 07/21/2018 CLINICAL DATA:  Weight loss, nausea, vomiting, abnormal LFTs. EXAM: CT CHEST, ABDOMEN AND PELVIS WITHOUT CONTRAST TECHNIQUE: Multidetector CT imaging of the chest, abdomen and pelvis was performed following the standard protocol without IV contrast. COMPARISON:  CT abdomen and pelvis 07/26/2016 FINDINGS: CT CHEST FINDINGS Cardiovascular: Scattered aortic and coronary artery calcifications. Heart is normal size. Aorta is normal caliber. Mediastinum/Nodes: No mediastinal, hilar, or axillary adenopathy. Lungs/Pleura: No confluent opacities or effusions. Musculoskeletal: Chest wall soft tissues are unremarkable. No acute bony abnormality. CT ABDOMEN PELVIS FINDINGS Hepatobiliary: No focal liver abnormality is seen. Status post cholecystectomy. No biliary dilatation. Pancreas: Appears to be atrophy of the pancreatic body and tail. There is fullness in the region the pancreatic head, difficult to visualize well and separate from the  adjacent duodenum on this noncontrast study. Spleen: No focal abnormality.  Normal size. Adrenals/Urinary Tract: No adrenal abnormality. No focal renal abnormality. No stones or hydronephrosis. Urinary bladder is unremarkable. Stomach/Bowel: There is significant gas and fluid distention of the stomach. The distal stomach and proximal duodenum are difficult to evaluate with lack of oral and IV contrast. Cannot exclude wall thickening in this area. Remainder of the small bowel is decompressed. Large bowel unremarkable. Appendix normal. Vascular/Lymphatic: Aortic atherosclerosis. No enlarged abdominal or pelvic lymph nodes. Reproductive: Uterus and adnexa unremarkable.  No mass. Other: No free fluid or free air. Musculoskeletal: No acute bony abnormality. IMPRESSION: Abnormal appearance in the region of the distal stomach and 1st and 2nd portions of the duodenum. There appears to be some wall thickening in this area. The stomach is significantly distended with fluid and gas. In addition, there appears to be soft tissue fullness in the region of the pancreatic head. This area is difficult to evaluate with the lack of IV and oral contrast. Consider further evaluation with contrast enhanced CT with oral and IV contrast. This could be performed after NG tube placement to decompress the stomach. Coronary artery disease. No acute cardiopulmonary disease. Electronically Signed   By: Charlett Nose M.D.   On: 07/21/2018 03:25   US Abdomen Complete  Result Date: 07/21/2018 CLINICAL DATA:  Abdominal pain  for 5 days. Vomiting. History of cholecystectomy, pancreatitis, hypertension, and diabetes. EXAM: ABDOMEN ULTRASOUND COMPLETE COMPARISON:  CT abdomen and pelvis 07/21/2018 FINDINGS: Gallbladder: Surgically absent. Common bile duct: Diameter: 12 mm Liver: No focal lesion identified. Within normal limits in parenchymal echogenicity. Portal vein is patent on color Doppler imaging with normal direction of blood flow towards the  liver. IVC: No abnormality visualized. Pancreas: Not visualized. Spleen: Size and appearance within normal limits. Right Kidney: Length: 10.8 cm. Echogenicity within normal limits. No mass or hydronephrosis visualized. Left Kidney: Length: 10.4 cm. Echogenicity within normal limits. No mass or hydronephrosis visualized. Abdominal aorta: No aneurysm visualized. Other findings: None. IMPRESSION: 1. Mild extrahepatic biliary dilatation status post cholecystectomy, correlate with laboratory studies. 2. Nonvisualization of the pancreas. Electronically Signed   By: Sebastian AcheAllen  Grady M.D.   On: 07/21/2018 09:33   Dg Chest Portable 1 View  Result Date: 07/21/2018 CLINICAL DATA:  Shortness of Breath EXAM: PORTABLE CHEST 1 VIEW COMPARISON:  None. FINDINGS: Heart and mediastinal contours are within normal limits. No focal opacities or effusions. No acute bony abnormality. IMPRESSION: No active disease. Electronically Signed   By: Charlett NoseKevin  Dover M.D.   On: 07/21/2018 00:46   Dg Abd Portable 1v  Result Date: 07/21/2018 CLINICAL DATA:  Nasogastric tube placement EXAM: PORTABLE ABDOMEN - 1 VIEW COMPARISON:  Chest CT from earlier today FINDINGS: Nasogastric tube loops through the stomach with tip at the fundus. The upper abdominal bowel gas pattern is normal. In bases are clear. Cholecystectomy clips. IMPRESSION: Nasogastric tube loops through the stomach with tip at the fundus. Electronically Signed   By: Marnee SpringJonathon  Watts M.D.   On: 07/21/2018 05:24    Microbiology: Recent Results (from the past 240 hour(s))  Culture, blood (routine x 2)     Status: None (Preliminary result)   Collection Time: 07/21/18  1:20 AM  Result Value Ref Range Status   Specimen Description BLOOD RIGHT HAND  Final   Special Requests   Final    BOTTLES DRAWN AEROBIC AND ANAEROBIC Blood Culture results may not be optimal due to an inadequate volume of blood received in culture bottles   Culture   Final    NO GROWTH 3 DAYS Performed at [facility], [address] [place], [place] [address]    Report Status PENDING  Incomplete  Culture, blood (routine x 2)     Status: None (Preliminary result)   Collection Time: 07/21/18  1:35 AM  Result Value Ref Range Status   Specimen Description BLOOD LEFT HAND  Final   Special Requests   Final    BOTTLES DRAWN AEROBIC AND ANAEROBIC Blood Culture results may not be optimal due to an inadequate volume of blood received in culture bottles   Culture   Final    NO GROWTH 3 DAYS Performed at [facility], [address] [place], [place] [address]    Report Status PENDING  Incomplete  MRSA PCR Screening     Status: None   Collection Time: 07/21/18  6:44 AM  Result Value Ref Range Status   MRSA by PCR NEGATIVE NEGATIVE Final    Comment:        The GeneXpert MRSA Assay (FDA approved for NASAL specimens only), is one component of a comprehensive MRSA colonization surveillance program. It is not intended to diagnose MRSA infection nor to guide or monitor treatment for MRSA infections. Performed at [facility], [address] [place], [place] [address]  Labs: Basic Metabolic Panel: Recent Labs  Lab 07/21/18 0039  07/22/18 0054 07/22/18 0518 07/22/18 1044 07/23/18 0230 07/24/18 0241 07/24/18 1444  NA 143   < > 140 139 138 141 138  --   K 3.9   < > 3.4* 3.3* 2.9* 3.1* 3.0* 4.3  CL 102   < > 107 105 106 109 107  --   CO2 <7*   < > 23 21* 24 23 23   --   GLUCOSE 618*   < > 134* 302* 277* 127* 172*  --   BUN 18   < > 11 11 8  5* 7  --   CREATININE 1.83*   < > 0.87 0.84 0.82 0.76 0.54  --   CALCIUM 9.9   < > 8.3* 8.3* 8.0* 7.9* 7.9*  --   MG 1.8  --   --   --   --  1.0* 1.9  --    < > = values in this interval not displayed.   Liver Function Tests: Recent Labs  Lab 07/21/18 0039 07/23/18 0230 07/24/18 0241  AST 90* 73* 38  ALT 210* 77* 56*  ALKPHOS 1,028* 508* 421*  BILITOT 2.7* 0.9 0.7  PROT 8.0 5.3* 5.1*  ALBUMIN 3.9 2.3* 2.3*   Recent Labs  Lab  07/21/18 0039 07/23/18 0230  LIPASE 105* 58*   No results for input(s): AMMONIA in the last 168 hours. CBC: Recent Labs  Lab 07/21/18 0039 07/21/18 0058 07/21/18 1246 07/23/18 0230  WBC 19.2*  --   --  5.9  NEUTROABS 16.9*  --   --   --   HGB 13.8 14.3 11.2* 10.6*  HCT 46.4* 42.0 33.0* 32.6*  MCV 100.9*  --   --  91.8  PLT 340  --   --  105*   Cardiac Enzymes: Recent Labs  Lab 07/21/18 1212 07/21/18 1830 07/22/18 0054  TROPONINI 0.28* 0.28* 0.20*   BNP: BNP (last 3 results) No results for input(s): BNP in the last 8760 hours.  ProBNP (last 3 results) No results for input(s): PROBNP in the last 8760 hours.  CBG: Recent Labs  Lab 07/23/18 2008 07/23/18 2149 07/24/18 0740 07/24/18 1130 07/24/18 1635  GLUCAP 206* 231* 105* 213* 207*       Signed:  Toleen Lachapelle  [facility] 07/24/2018, 5:08 PM

## 2018-07-24 NOTE — TOC Benefit Eligibility Note (Signed)
Transition of Care [facility]) Benefit Eligibility Note    Patient Details  Name: Mallory Stewart MRN: [id number] Date of Birth: [birth year]   Medication/Dose: NovoLog  Covered?: Yes  Prescription Coverage Preferred Pharmacy: [facility]  Spoke with Person/Company/Phone Number:: [employer]  Co-Pay: $25  Prior Approval: No  Additional Notes: none  Orson Aloe Phone Number: 07/24/2018, 12:18 PM

## 2018-07-25 ENCOUNTER — Telehealth: Payer: Self-pay

## 2018-07-25 NOTE — Telephone Encounter (Signed)
LM requesting call back to complete TCM and schedule hospital follow up.   

## 2018-07-26 LAB — CULTURE, BLOOD (ROUTINE X 2)
Culture: NO GROWTH
Culture: NO GROWTH

## 2018-07-28 NOTE — Telephone Encounter (Signed)
Second attempt to contact patient to complete TCM.

## 2018-07-29 NOTE — Telephone Encounter (Signed)
Pt returning call to nurse. Please call pt °

## 2018-07-29 NOTE — Telephone Encounter (Signed)
Third attempt to contact patient to complete TCM and confirm hospital f/u. LM requesting call back.

## 2018-07-31 NOTE — Telephone Encounter (Signed)
LM to return call.

## 2018-08-01 ENCOUNTER — Ambulatory Visit: Payer: 59 | Admitting: Family Medicine

## 2018-08-01 ENCOUNTER — Other Ambulatory Visit: Payer: Self-pay | Admitting: Family Medicine

## 2018-08-01 ENCOUNTER — Encounter: Payer: Self-pay | Admitting: Family Medicine

## 2018-08-01 ENCOUNTER — Other Ambulatory Visit: Payer: Self-pay

## 2018-08-01 VITALS — BP 140/90 | HR 84 | Temp 98.3°F | Ht 68.25 in | Wt 147.5 lb

## 2018-08-01 DIAGNOSIS — R140 Abdominal distension (gaseous): Secondary | ICD-10-CM | POA: Insufficient documentation

## 2018-08-01 DIAGNOSIS — I10 Essential (primary) hypertension: Secondary | ICD-10-CM

## 2018-08-01 DIAGNOSIS — R935 Abnormal findings on diagnostic imaging of other abdominal regions, including retroperitoneum: Secondary | ICD-10-CM | POA: Diagnosis not present

## 2018-08-01 DIAGNOSIS — E43 Unspecified severe protein-calorie malnutrition: Secondary | ICD-10-CM

## 2018-08-01 DIAGNOSIS — E119 Type 2 diabetes mellitus without complications: Secondary | ICD-10-CM | POA: Diagnosis not present

## 2018-08-01 DIAGNOSIS — R112 Nausea with vomiting, unspecified: Secondary | ICD-10-CM

## 2018-08-01 DIAGNOSIS — R198 Other specified symptoms and signs involving the digestive system and abdomen: Secondary | ICD-10-CM | POA: Insufficient documentation

## 2018-08-01 DIAGNOSIS — R42 Dizziness and giddiness: Secondary | ICD-10-CM

## 2018-08-01 DIAGNOSIS — G5793 Unspecified mononeuropathy of bilateral lower limbs: Secondary | ICD-10-CM

## 2018-08-01 DIAGNOSIS — E1110 Type 2 diabetes mellitus with ketoacidosis without coma: Secondary | ICD-10-CM

## 2018-08-01 LAB — COMPREHENSIVE METABOLIC PANEL
ALT: 52 U/L — ABNORMAL HIGH (ref 0–35)
AST: 46 U/L — ABNORMAL HIGH (ref 0–37)
Albumin: 3.6 g/dL (ref 3.5–5.2)
Alkaline Phosphatase: 553 U/L — ABNORMAL HIGH (ref 39–117)
BUN: 8 mg/dL (ref 6–23)
CALCIUM: 9.3 mg/dL (ref 8.4–10.5)
CO2: 26 mEq/L (ref 19–32)
Chloride: 101 mEq/L (ref 96–112)
Creatinine, Ser: 0.64 mg/dL (ref 0.40–1.20)
GFR: 96.16 mL/min (ref >60.00)
Glucose, Bld: 317 mg/dL — ABNORMAL HIGH (ref 70–99)
POTASSIUM: 5 meq/L (ref 3.5–5.1)
Sodium: 137 mEq/L (ref 135–145)
Total Bilirubin: 0.9 mg/dL (ref 0.2–1.2)
Total Protein: 6.5 g/dL (ref 6.0–8.3)

## 2018-08-01 LAB — MAGNESIUM: Magnesium: 1.4 mg/dL — ABNORMAL LOW (ref 1.5–2.5)

## 2018-08-01 MED ORDER — ONETOUCH ULTRA 2 W/DEVICE KIT: PACK | 0 refills | Status: DC

## 2018-08-01 MED ORDER — ONDANSETRON HCL 4 MG PO TABS: 4.0000 mg | ORAL_TABLET | Freq: Three times a day (TID) | ORAL | 0 refills | Status: DC | PRN

## 2018-08-01 MED ORDER — ONETOUCH ULTRASOFT LANCETS MISC: 3 refills | Status: DC

## 2018-08-01 MED ORDER — GLUCOSE BLOOD VI STRP: ORAL_STRIP | 3 refills | Status: DC

## 2018-08-01 NOTE — Assessment & Plan Note (Signed)
Unclear cause for recurrent emesis and unexpected weight loss leading up to  stopping meds and DKA.  Will eval with labs but likely increase insulin . Will refer to ENDO for carefull eval and treatment.  Pt to continue nutritionist.

## 2018-08-01 NOTE — Patient Instructions (Signed)
Continue flonase 2 sprays per nostril daily.  Start zyrtec or Xyzal for possible allergy component of ear fullness.  We will call with ENDO and GI referrals.  Please stop at the lab to have labs drawn.

## 2018-08-01 NOTE — Progress Notes (Signed)
Subjective:    Patient ID: Mallory Stewart, female    DOB: [birth year], 58 y.o.   MRN: [id number]  HPI     58 year old female with diabetes presents following hospitalization 07/20/2017 to  07/24/2018 for  Nausea vomiting with history of pancreatitis, malnutrition and poorly controlled DM with diabetic ketoacidosis.  She had been feeling week since early January. Periodic emesis with no clear cause.  She has been trrying to lose weight over the last few years, but has unexpectedly losing weight  ( 20 Lbs) in last 2 months. Then 1 week prior to admission.   She presented with poor eating, emesis and extreme thirsts. Stomach had been feeling   She stopped her DM meds for 5 days prior. Could only keep down regular coke soda.  [place] became weaker.    Previously A1C had been 6.9 then 9.2 over the last 9 months. Lab Results  Component Value Date   HGBA1C 13.3 (H) 07/23/2018    Needs repeat CMP and MG today in office.  Hospital course pulled in below for clarity;  Hospital Course:  DKA/diabetes mellitus, type II uncontrolled -Patient did require insulin drip, however was transitioned to Lantus and insulin sliding scale with CBG monitoring -Hemoglobin A1c13.3 -Diabetes coordinator consulted and appreciated- discussed in depth- patient does not really want to be on insulin for a long time. Will discharge patient with Evaristo B[place] 28u daily with close follow up with PCP/endocrinologist  -Suspect DKA likely secondary to noncompliance with medications, patient admits to not taking her medications for approximately 5 days as she was not feeling well. -Discussed patient with her PCP, Dr. Ermalene Searing  Nausea, vomiting with history of pancreatitis -CT abdomen pelvis showed thickening of the stomach, duodenum/questionable mass or fullness to the pancreatic head -Patient did have history of alcohol use, 2 glasses of wine per day - however states she does not drink on a daily basis  -She has had  gallstones in the past with cholecystectomy approximately 2 years ago -72 pound weight loss in the last 2 to 3 years -She states she was not on prednisone however this was prescribed to her for pain -Currently she denies any further nausea or vomiting, denies any diarrhea. -Continue diet along with antiemetics as needed. Follow-up  With [facility]  as outpatient ( [facility]), alcohol abstinence. -discussed with patient the need for abstinence and how alcohol can affect her sugars  Essential hypertension -Will discontinue IV Lopressor and place on home metoprolol 50 mg twice daily -Home lisinopril held- may resume on discharge  Acute kidney injury -Creatinine was 1.83 on admission, suspect secondary to the above  resolved at discharge to 0.76  Hypokalemia/hypomagnesemia -resolved with replacement -repeat in one week  Elevated LFTs -Likely secondary to the above, trending downward -repeat CMP in one week  Severe malnutrition -Likely secondary to acute illness, continue feeding supplementation    Today she reports she feels " pretty crappy". She feels bussing in head, swimmy headed.  Slight decrease in hearing lately.  She feels shaky.  She is eating regularly ( protein smoothis, peanut butter toast, grilled chicken, drinking lots of fluids).. but no appetite... when she eats it feels crampy and blaoted in upper abdomen.  no abd pain.. no diarrhea, no constipation.  Food does not taste right  She has not thrown up since she has been home. No fever.   She is taking [place] 28 Units. daily  FBS 119- 214  Since discharge.  She is back on lisinopril  for BP.   She has not needed gabapentin for neuropathy in  Hospital or since home. . No alcohol use reported in  last month.. prior to that, occasional ETOH.. max 2 glasses of wine a night but  Not daily. Pt denies alcohol.   Social History /Family History/Past Medical History reviewed in detail and updated in EMR if needed.  Blood pressure 140/90, pulse 84, temperature 98.3 F (36.8 C), temperature source Oral, height 5' 8.25" (1.734 m), weight 147 lb 8 oz (66.9 kg). Wt Readings from Last 3 Encounters:  08/01/18 147 lb 8 oz (66.9 kg)  07/24/18 155 lb 13.8 oz (70.7 kg)  12/26/17 173 lb 12 oz (78.8 kg)     Review of Systems  Constitutional: Positive for appetite change, fatigue and unexpected weight change. Negative for fever.  HENT: Positive for postnasal drip, rhinorrhea and sinus pressure. Negative for ear pain.   Eyes: Negative for redness and itching.  Respiratory: Negative for shortness of breath.   Cardiovascular: Negative for chest pain, palpitations and leg swelling.  Gastrointestinal: Positive for nausea. Negative for blood in stool, constipation and diarrhea.  Psychiatric/Behavioral: Negative for confusion.       Objective:   Physical Exam Constitutional:      General: She is not in acute distress.    Appearance: Normal appearance. She is well-developed. She is cachectic. She is not ill-appearing or toxic-appearing.  HENT:     Head: Normocephalic.     Right Ear: Hearing, tympanic membrane, ear canal and external ear normal. Tympanic membrane is not erythematous, retracted or bulging.     Left Ear: Hearing, tympanic membrane, ear canal and external ear normal. Tympanic membrane is not erythematous, retracted or bulging.     Nose: No mucosal edema or rhinorrhea.     Right Sinus: No maxillary sinus tenderness or frontal sinus tenderness.     Left Sinus: No maxillary sinus tenderness or frontal sinus tenderness.     Mouth/Throat:     Pharynx: Uvula midline.  Eyes:     General: Lids are normal. Lids are everted, no foreign bodies appreciated.     Conjunctiva/sclera: Conjunctivae normal.     Pupils: Pupils are equal, round, and reactive to light.  Neck:     Musculoskeletal: Normal range of motion and neck supple.     Thyroid: No thyroid mass or thyromegaly.     Vascular: No carotid bruit.      Trachea: Trachea normal.  Cardiovascular:     Rate and Rhythm: Normal rate and regular rhythm.     Pulses: Normal pulses.     Heart sounds: Normal heart sounds, S1 normal and S2 normal. No murmur. No friction rub. No gallop.   Pulmonary:     Effort: Pulmonary effort is normal. No tachypnea or respiratory distress.     Breath sounds: Normal breath sounds. No decreased breath sounds, wheezing, rhonchi or rales.  Abdominal:     General: Bowel sounds are normal.     Palpations: Abdomen is soft.     Tenderness: There is abdominal tenderness in the epigastric area. There is no right CVA tenderness, left CVA tenderness, guarding or rebound. Negative signs include Murphy's sign and McBurney's sign.     Hernia: No hernia is present.  Skin:    General: Skin is warm and dry.     Findings: No rash.  Neurological:     Mental Status: She is alert.  Psychiatric:        Mood and Affect: Mood is  not anxious or depressed.        Speech: Speech normal.        Behavior: Behavior normal. Behavior is cooperative.        Thought Content: Thought content normal.        Judgment: Judgment normal.           Assessment & Plan:

## 2018-08-01 NOTE — Assessment & Plan Note (Signed)
Tolerable control back on lisinopril.

## 2018-08-01 NOTE — Assessment & Plan Note (Signed)
Resolved but pt remains with poorly controlled FBS.  Will re-eval electrolytes today. Pt not currently dehydrated and keeping up with fluid intake.

## 2018-08-01 NOTE — Assessment & Plan Note (Signed)
IMproved and pt now off gabapentin.

## 2018-08-01 NOTE — Assessment & Plan Note (Signed)
Refer to GI for further eval of recurrent emesis and Abn CT in hospital..unclear if related to current malnutrition.  Pt has lost 30 lbs since I last saw her in the office.

## 2018-08-01 NOTE — Assessment & Plan Note (Signed)
Most likely due to allergies and ETD... start floanse and antihistmine.

## 2018-08-12 ENCOUNTER — Encounter (HOSPITAL_COMMUNITY): Payer: Self-pay | Admitting: *Deleted

## 2018-08-12 ENCOUNTER — Telehealth: Payer: Self-pay

## 2018-08-12 ENCOUNTER — Emergency Department (HOSPITAL_COMMUNITY): Payer: 59

## 2018-08-12 ENCOUNTER — Other Ambulatory Visit: Payer: Self-pay

## 2018-08-12 ENCOUNTER — Inpatient Hospital Stay (HOSPITAL_COMMUNITY)
Admission: EM | Admit: 2018-08-12 | Discharge: 2018-08-15 | DRG: 380 | Disposition: A | Discharge status: Home / Self Care | Payer: 59 | Attending: Family Medicine | Admitting: Family Medicine

## 2018-08-12 DIAGNOSIS — E7800 Pure hypercholesterolemia, unspecified: Secondary | ICD-10-CM | POA: Diagnosis present

## 2018-08-12 DIAGNOSIS — E538 Deficiency of other specified B group vitamins: Secondary | ICD-10-CM | POA: Diagnosis present

## 2018-08-12 DIAGNOSIS — I371 Nonrheumatic pulmonary valve insufficiency: Secondary | ICD-10-CM | POA: Diagnosis not present

## 2018-08-12 DIAGNOSIS — K6389 Other specified diseases of intestine: Secondary | ICD-10-CM | POA: Diagnosis not present

## 2018-08-12 DIAGNOSIS — Z9049 Acquired absence of other specified parts of digestive tract: Secondary | ICD-10-CM

## 2018-08-12 DIAGNOSIS — K209 Esophagitis, unspecified: Secondary | ICD-10-CM | POA: Diagnosis present

## 2018-08-12 DIAGNOSIS — K8689 Other specified diseases of pancreas: Secondary | ICD-10-CM | POA: Diagnosis present

## 2018-08-12 DIAGNOSIS — G8929 Other chronic pain: Secondary | ICD-10-CM | POA: Diagnosis present

## 2018-08-12 DIAGNOSIS — K838 Other specified diseases of biliary tract: Secondary | ICD-10-CM | POA: Diagnosis not present

## 2018-08-12 DIAGNOSIS — I10 Essential (primary) hypertension: Secondary | ICD-10-CM | POA: Diagnosis present

## 2018-08-12 DIAGNOSIS — R634 Abnormal weight loss: Secondary | ICD-10-CM | POA: Diagnosis present

## 2018-08-12 DIAGNOSIS — Z6821 Body mass index (BMI) 21.0-21.9, adult: Secondary | ICD-10-CM

## 2018-08-12 DIAGNOSIS — Z794 Long term (current) use of insulin: Secondary | ICD-10-CM

## 2018-08-12 DIAGNOSIS — E10649 Type 1 diabetes mellitus with hypoglycemia without coma: Secondary | ICD-10-CM | POA: Diagnosis present

## 2018-08-12 DIAGNOSIS — K760 Fatty (change of) liver, not elsewhere classified: Secondary | ICD-10-CM | POA: Diagnosis present

## 2018-08-12 DIAGNOSIS — E1159 Type 2 diabetes mellitus with other circulatory complications: Secondary | ICD-10-CM | POA: Diagnosis present

## 2018-08-12 DIAGNOSIS — R9431 Abnormal electrocardiogram [ECG] [EKG]: Secondary | ICD-10-CM | POA: Diagnosis present

## 2018-08-12 DIAGNOSIS — E1040 Type 1 diabetes mellitus with diabetic neuropathy, unspecified: Secondary | ICD-10-CM | POA: Diagnosis present

## 2018-08-12 DIAGNOSIS — E876 Hypokalemia: Secondary | ICD-10-CM | POA: Diagnosis present

## 2018-08-12 DIAGNOSIS — K859 Acute pancreatitis without necrosis or infection, unspecified: Secondary | ICD-10-CM | POA: Diagnosis present

## 2018-08-12 DIAGNOSIS — K861 Other chronic pancreatitis: Secondary | ICD-10-CM | POA: Diagnosis present

## 2018-08-12 DIAGNOSIS — Z7951 Long term (current) use of inhaled steroids: Secondary | ICD-10-CM

## 2018-08-12 DIAGNOSIS — Z803 Family history of malignant neoplasm of breast: Secondary | ICD-10-CM

## 2018-08-12 DIAGNOSIS — E785 Hyperlipidemia, unspecified: Secondary | ICD-10-CM | POA: Diagnosis present

## 2018-08-12 DIAGNOSIS — R112 Nausea with vomiting, unspecified: Secondary | ICD-10-CM

## 2018-08-12 DIAGNOSIS — I152 Hypertension secondary to endocrine disorders: Secondary | ICD-10-CM | POA: Diagnosis present

## 2018-08-12 DIAGNOSIS — F1010 Alcohol abuse, uncomplicated: Secondary | ICD-10-CM | POA: Diagnosis present

## 2018-08-12 DIAGNOSIS — R748 Abnormal levels of other serum enzymes: Secondary | ICD-10-CM | POA: Diagnosis not present

## 2018-08-12 DIAGNOSIS — Z8042 Family history of malignant neoplasm of prostate: Secondary | ICD-10-CM

## 2018-08-12 DIAGNOSIS — K222 Esophageal obstruction: Secondary | ICD-10-CM | POA: Diagnosis not present

## 2018-08-12 DIAGNOSIS — K311 Adult hypertrophic pyloric stenosis: Principal | ICD-10-CM | POA: Diagnosis present

## 2018-08-12 DIAGNOSIS — M5030 Other cervical disc degeneration, unspecified cervical region: Secondary | ICD-10-CM | POA: Diagnosis present

## 2018-08-12 DIAGNOSIS — Z8249 Family history of ischemic heart disease and other diseases of the circulatory system: Secondary | ICD-10-CM

## 2018-08-12 DIAGNOSIS — R198 Other specified symptoms and signs involving the digestive system and abdomen: Secondary | ICD-10-CM | POA: Diagnosis not present

## 2018-08-12 DIAGNOSIS — K860 Alcohol-induced chronic pancreatitis: Secondary | ICD-10-CM

## 2018-08-12 DIAGNOSIS — K862 Cyst of pancreas: Secondary | ICD-10-CM | POA: Diagnosis not present

## 2018-08-12 DIAGNOSIS — Z808 Family history of malignant neoplasm of other organs or systems: Secondary | ICD-10-CM

## 2018-08-12 DIAGNOSIS — K863 Pseudocyst of pancreas: Secondary | ICD-10-CM | POA: Diagnosis present

## 2018-08-12 DIAGNOSIS — E1169 Type 2 diabetes mellitus with other specified complication: Secondary | ICD-10-CM | POA: Diagnosis present

## 2018-08-12 DIAGNOSIS — Z79899 Other long term (current) drug therapy: Secondary | ICD-10-CM

## 2018-08-12 DIAGNOSIS — K2970 Gastritis, unspecified, without bleeding: Secondary | ICD-10-CM | POA: Diagnosis present

## 2018-08-12 DIAGNOSIS — R740 Nonspecific elevation of levels of transaminase and lactic acid dehydrogenase [LDH]: Secondary | ICD-10-CM | POA: Diagnosis present

## 2018-08-12 DIAGNOSIS — Z823 Family history of stroke: Secondary | ICD-10-CM

## 2018-08-12 DIAGNOSIS — D649 Anemia, unspecified: Secondary | ICD-10-CM | POA: Diagnosis present

## 2018-08-12 DIAGNOSIS — K831 Obstruction of bile duct: Secondary | ICD-10-CM | POA: Diagnosis present

## 2018-08-12 DIAGNOSIS — K315 Obstruction of duodenum: Secondary | ICD-10-CM | POA: Diagnosis not present

## 2018-08-12 DIAGNOSIS — I899 Noninfective disorder of lymphatic vessels and lymph nodes, unspecified: Secondary | ICD-10-CM | POA: Diagnosis not present

## 2018-08-12 DIAGNOSIS — E46 Unspecified protein-calorie malnutrition: Secondary | ICD-10-CM | POA: Diagnosis present

## 2018-08-12 DIAGNOSIS — I361 Nonrheumatic tricuspid (valve) insufficiency: Secondary | ICD-10-CM | POA: Diagnosis not present

## 2018-08-12 HISTORY — DX: Concussion with loss of consciousness of unspecified duration, initial encounter: S06.0X9A

## 2018-08-12 HISTORY — DX: Polyneuropathy, unspecified: G62.9

## 2018-08-12 HISTORY — DX: Concussion with loss of consciousness status unknown, initial encounter: S06.0XAA

## 2018-08-12 HISTORY — DX: Deficiency of other specified B group vitamins: E53.8

## 2018-08-12 HISTORY — DX: Other cervical disc degeneration, unspecified cervical region: M50.30

## 2018-08-12 HISTORY — DX: Type 2 diabetes mellitus with ketoacidosis without coma: E11.10

## 2018-08-12 HISTORY — DX: Other seasonal allergic rhinitis: J30.2

## 2018-08-12 HISTORY — DX: Adult hypertrophic pyloric stenosis: K31.1

## 2018-08-12 LAB — COMPREHENSIVE METABOLIC PANEL
ALT: 125 U/L — ABNORMAL HIGH (ref 0–44)
AST: 96 U/L — ABNORMAL HIGH (ref 15–41)
Albumin: 3.4 g/dL — ABNORMAL LOW (ref 3.5–5.0)
Alkaline Phosphatase: 486 U/L — ABNORMAL HIGH (ref 38–126)
Anion gap: 15 (ref 5–15)
BUN: 6 mg/dL (ref 6–20)
CO2: 24 mmol/L (ref 22–32)
Calcium: 9.8 mg/dL (ref 8.9–10.3)
Chloride: 102 mmol/L (ref 98–111)
Creatinine, Ser: 0.66 mg/dL (ref 0.44–1.00)
GFR calc Af Amer: >60 mL/min (ref >60)
GFR calc non Af Amer: >60 mL/min (ref >60)
Glucose, Bld: 120 mg/dL — ABNORMAL HIGH (ref 70–99)
Potassium: 3.6 mmol/L (ref 3.5–5.1)
Sodium: 141 mmol/L (ref 135–145)
Total Bilirubin: 1.1 mg/dL (ref 0.3–1.2)
Total Protein: 7.1 g/dL (ref 6.5–8.1)

## 2018-08-12 LAB — URINALYSIS, ROUTINE W REFLEX MICROSCOPIC
Bacteria, UA: NONE SEEN
Bilirubin Urine: NEGATIVE
Glucose, UA: NEGATIVE mg/dL
Hgb urine dipstick: NEGATIVE
Ketones, ur: 5 mg/dL — AB
Leukocytes,Ua: NEGATIVE
Nitrite: NEGATIVE
Protein, ur: 30 mg/dL — AB
Specific Gravity, Urine: 1.029 (ref 1.005–1.030)
pH: 5 (ref 5.0–8.0)

## 2018-08-12 LAB — CBC
HCT: 36.3 % (ref 36.0–46.0)
Hemoglobin: 12.2 g/dL (ref 12.0–15.0)
MCH: 30.4 pg (ref 26.0–34.0)
MCHC: 33.6 g/dL (ref 30.0–36.0)
MCV: 90.5 fL (ref 80.0–100.0)
Platelets: 182 10*3/uL (ref 150–400)
RBC: 4.01 MIL/uL (ref 3.87–5.11)
RDW: 11.8 % (ref 11.5–15.5)
WBC: 7.5 10*3/uL (ref 4.0–10.5)
nRBC: 0 % (ref 0.0–0.2)

## 2018-08-12 LAB — I-STAT BETA HCG BLOOD, ED (MC, WL, AP ONLY): I-stat hCG, quantitative: <5 m[IU]/mL (ref <5)

## 2018-08-12 LAB — LIPASE, BLOOD: Lipase: 116 U/L — ABNORMAL HIGH (ref 11–51)

## 2018-08-12 MED ORDER — ONDANSETRON HCL 4 MG PO TABS: 4.0000 mg | ORAL_TABLET | Freq: Four times a day (QID) | ORAL | Status: DC | PRN

## 2018-08-12 MED ORDER — ONDANSETRON HCL 4 MG/2ML IJ SOLN
4.0000 mg | Freq: Four times a day (QID) | INTRAMUSCULAR | Status: DC | PRN
  Administered 2018-08-13: 4 mg via INTRAVENOUS
  Filled 2018-08-12: qty 2

## 2018-08-12 MED ORDER — IOHEXOL 300 MG/ML  SOLN
100.0000 mL | Freq: Once | INTRAMUSCULAR | Status: AC | PRN
  Administered 2018-08-12: 100 mL via INTRAVENOUS

## 2018-08-12 MED ORDER — SODIUM CHLORIDE 0.9 % IV SOLN
INTRAVENOUS | Status: DC
  Administered 2018-08-12 – 2018-08-13 (×3): via INTRAVENOUS

## 2018-08-12 MED ORDER — SODIUM CHLORIDE 0.9% FLUSH
3.0000 mL | Freq: Once | INTRAVENOUS | Status: AC
  Administered 2018-08-12: 3 mL via INTRAVENOUS

## 2018-08-12 MED ORDER — HALOPERIDOL LACTATE 5 MG/ML IJ SOLN
3.0000 mg | Freq: Once | INTRAMUSCULAR | Status: AC
  Administered 2018-08-12: 3 mg via INTRAVENOUS
  Filled 2018-08-12: qty 1

## 2018-08-12 MED ORDER — SODIUM CHLORIDE 0.9 % IV BOLUS
1000.0000 mL | Freq: Once | INTRAVENOUS | Status: AC
  Administered 2018-08-12: 1000 mL via INTRAVENOUS

## 2018-08-12 MED ORDER — ENOXAPARIN SODIUM 40 MG/0.4ML ~~LOC~~ SOLN
40.0000 mg | SUBCUTANEOUS | Status: DC
  Administered 2018-08-12 – 2018-08-14 (×3): 40 mg via SUBCUTANEOUS
  Filled 2018-08-12 (×3): qty 0.4

## 2018-08-12 MED ORDER — MORPHINE SULFATE (PF) 4 MG/ML IV SOLN
4.0000 mg | Freq: Once | INTRAVENOUS | Status: AC
  Administered 2018-08-12: 4 mg via INTRAVENOUS
  Filled 2018-08-12: qty 1

## 2018-08-12 NOTE — H&P (Signed)
History and Physical   Mallory Stewart OEV:[id number] DOB: [birth year] DOA: 08/12/2018  Referring MD/NP/PA: Dr. Billy Fischer  PCP: [name], MD   Outpatient Specialists: [facility]  Patient coming from: Home  Chief Complaint: Intractable nausea vomiting  HPI: Mallory Stewart is a 58 y.o. female with medical history significant of diabetes type 1 with recent DKA, hypertension, chronic pancreatitis Who was admitted on March 16 with DKA and discharged home in good health.  Patient returns today due to intractable nausea vomiting and abdominal pain.  Symptoms have been going on for 3 days now.  She has been taking her nausea medicine but not relieved.  She has pain at 8 out of 10 in the epigastric region radiating to her back.  She denied taking NSAIDs or alcohol.  Denied any new medications.  Denied any sick contacts and did not eat outside the home.  Patient was seen in the ER evaluated and found to have gastric outlet obstruction due to pancreatic mass.  She is therefore being admitted for work-up..  ED Course: Vitals and lab work all appear to be within normal.  Glucose is 120.  ALT is 126 AST 96 lipase 116 and alkaline phosphatase of 486 with albumin of 3.4.  Urinalysis essentially negative.  CT abdomen pelvis showed Distal stomach thickening with proximal transverse duodenum.  Finding associated with the head of pancreas suspected mass or inflammation.  Gastric outlet obstruction as a result of that.  GI was consulted and we are consulted for admission.  Review of Systems: As per HPI otherwise 10 point review of systems negative.    Past Medical History:  Diagnosis Date   B12 deficiency    Concussion    DDD (degenerative disc disease), cervical    Diabetes ([place])    DKA, type 2 ([place])    Hypertension    Neuropathy    Pancreatitis    Seasonal allergies     Past Surgical History:  Procedure Laterality Date   CHOLECYSTECTOMY N/A 07/28/2016   Procedure:  LAPAROSCOPIC CHOLECYSTECTOMY WITH INTRAOPERATIVE CHOLANGIOGRAM;  Surgeon: Mickeal Skinner, MD;  Location: [facility];  Service: General;  Laterality: N/A;   LEEP  1990's   ORIF FOOT FRACTURE  09/2008   L 5th metatarsal   REFRACTIVE SURGERY  2000   TONSILLECTOMY       reports that she has never smoked. She has never used smokeless tobacco. She reports current alcohol use of about 7.0 standard drinks of alcohol per week. She reports that she does not use drugs.  Allergies  Allergen Reactions   Gluten Meal Other (See Comments)    unk   Simvastatin Other (See Comments)    Elevated lfts?    Family History  Problem Relation Age of Onset   Other Mother        tachycardia.Marland KitchenMarland Kitchen?afib   Stroke Father        after hernia suegery   Prostate cancer Father    Other Father        global transient amnesia, unclear source   Atrial fibrillation Sister    Healthy Brother    Healthy Brother    Coronary artery disease Paternal Grandmother    Heart attack Paternal Grandmother 47   Brain cancer Maternal Grandfather        ?   Cancer Paternal Grandfather        ?   Breast cancer Maternal Grandmother      Prior to Admission medications   Medication Sig Start  Date End Date Taking? Authorizing Provider  fluticasone (FLONASE) 50 MCG/ACT nasal spray Place 2 sprays into both nostrils every morning.   Yes [provider]  Insulin Degludec (TRESIBA FLEXTOUCH) 200 UNIT/ML SOPN Inject 28 Units into the skin every morning for 30 days. Patient taking differently: Inject 40 Units into the skin every morning.  07/24/18 08/23/18 Yes Mikhail, Velta Addison, DO  levocetirizine (XYZAL) 5 MG tablet Take 5 mg by mouth at bedtime.   Yes [provider]  lisinopril (PRINIVIL,ZESTRIL) 40 MG tablet Take 1 tablet (40 mg total) by mouth daily. 12/26/17  Yes Bedsole, Amy E, MD  metoprolol tartrate (LOPRESSOR) 50 MG tablet TAKE 1 TABLET BY MOUTH TWICE A DAY Patient taking differently: Take 50 mg  by mouth 2 (two) times daily.  06/02/18  Yes Bedsole, Amy E, MD  Multiple Vitamin (MULTIVITAMIN WITH MINERALS) TABS tablet Take 1 tablet by mouth daily. 07/25/18  Yes Mikhail, Velta Addison, DO  ondansetron (ZOFRAN) 4 MG tablet Take 1 tablet (4 mg total) by mouth every 8 (eight) hours as needed for nausea or vomiting. 08/01/18  Yes Bedsole, Amy E, MD  Blood Glucose Monitoring Suppl (ONE TOUCH ULTRA 2) w/Device KIT Use to check blood sugars two times day 08/01/18   Diona Browner, Amy E, MD  gabapentin (NEURONTIN) 300 MG capsule Take 4 capsules (1,200 mg total) by mouth at bedtime. Patient not taking: Reported on 08/01/2018 04/28/18   Bedsole, Amy E, MD  glucose blood (ONE TOUCH ULTRA TEST) test strip CHECK BLOOD SUGAR TWO TIMES DAILY AS DIRECTED 08/01/18   Bedsole, Amy E, MD  Insulin Pen Needle (PEN NEEDLES) 31G X 5 MM MISC 1 application by Does not apply route daily. 07/24/18   Mikhail, Velta Addison, DO  Lancets [facility] ULTRASOFT) lancets Use to check blood sugar two times a day. 08/01/18   [name], MD    Physical Exam: Vitals:   08/12/18 1930 08/12/18 2000 08/12/18 2030 08/12/18 2105  BP: 119/73 117/73 119/72 110/70  Pulse: 69 64 67 65  Resp:    16  Temp:    97.8 F (36.6 C)  TempSrc:    Oral  SpO2: 100% 100% 99% 100%  Weight:      Height:          Constitutional: NAD, calm, comfortable Vitals:   08/12/18 1930 08/12/18 2000 08/12/18 2030 08/12/18 2105  BP: 119/73 117/73 119/72 110/70  Pulse: 69 64 67 65  Resp:    16  Temp:    97.8 F (36.6 C)  TempSrc:    Oral  SpO2: 100% 100% 99% 100%  Weight:      Height:       Eyes: PERRL, lids and conjunctivae normal ENMT: Mucous membranes are moist. Posterior pharynx clear of any exudate or lesions.Normal dentition.  Neck: normal, supple, no masses, no thyromegaly Respiratory: clear to auscultation bilaterally, no wheezing, no crackles. Normal respiratory effort. No accessory muscle use.  Cardiovascular: Regular rate and rhythm, no murmurs / rubs /  gallops. No extremity edema. 2+ pedal pulses. No carotid bruits.  Abdomen: Diffuse epigastric tenderness, no masses palpated. No hepatosplenomegaly. Bowel sounds positive.  Musculoskeletal: no clubbing / cyanosis. No joint deformity upper and lower extremities. Good ROM, no contractures. Normal muscle tone.  Skin: no rashes, lesions, ulcers. No induration Neurologic: CN 2-12 grossly intact. Sensation intact, DTR normal. Strength 5/5 in all 4.  Psychiatric: Normal judgment and insight. Alert and oriented x 3. Normal mood.     Labs on Admission: I have  personally reviewed following labs and imaging studies  CBC: Recent Labs  Lab 08/12/18 1536  WBC 7.5  HGB 12.2  HCT 36.3  MCV 90.5  PLT 163   Basic Metabolic Panel: Recent Labs  Lab 08/12/18 1536  NA 141  K 3.6  CL 102  CO2 24  GLUCOSE 120*  BUN 6  CREATININE 0.66  CALCIUM 9.8   GFR: Estimated Creatinine Clearance: 82.2 mL/min (by C-G formula based on SCr of 0.66 mg/dL). Liver Function Tests: Recent Labs  Lab 08/12/18 1536  AST 96*  ALT 125*  ALKPHOS 486*  BILITOT 1.1  PROT 7.1  ALBUMIN 3.4*   Recent Labs  Lab 08/12/18 1536  LIPASE 116*   No results for input(s): AMMONIA in the last 168 hours. Coagulation Profile: No results for input(s): INR, PROTIME in the last 168 hours. Cardiac Enzymes: No results for input(s): CKTOTAL, CKMB, CKMBINDEX, TROPONINI in the last 168 hours. BNP (last 3 results) No results for input(s): PROBNP in the last 8760 hours. HbA1C: No results for input(s): HGBA1C in the last 72 hours. CBG: No results for input(s): GLUCAP in the last 168 hours. Lipid Profile: No results for input(s): CHOL, HDL, LDLCALC, TRIG, CHOLHDL, LDLDIRECT in the last 72 hours. Thyroid Function Tests: No results for input(s): TSH, T4TOTAL, FREET4, T3FREE, THYROIDAB in the last 72 hours. Anemia Panel: No results for input(s): VITAMINB12, FOLATE, FERRITIN, TIBC, IRON, RETICCTPCT in the last 72 hours. Urine  analysis:    Component Value Date/Time   COLORURINE AMBER (A) 08/12/2018 1640   APPEARANCEUR HAZY (A) 08/12/2018 1640   APPEARANCEUR Hazy 04/07/2013 2105   LABSPEC 1.029 08/12/2018 1640   LABSPEC 1.020 04/07/2013 2105   PHURINE 5.0 08/12/2018 1640   GLUCOSEU NEGATIVE 08/12/2018 1640   GLUCOSEU 150 mg/dL 04/07/2013 2105   HGBUR NEGATIVE 08/12/2018 1640   HGBUR negative 11/03/2008 0822   BILIRUBINUR NEGATIVE 08/12/2018 1640   BILIRUBINUR Negative 04/07/2013 2105   KETONESUR 5 (A) 08/12/2018 1640   PROTEINUR 30 (A) 08/12/2018 1640   UROBILINOGEN 0.2 11/03/2008 0822   NITRITE NEGATIVE 08/12/2018 1640   LEUKOCYTESUR NEGATIVE 08/12/2018 1640   LEUKOCYTESUR Negative 04/07/2013 2105   Sepsis Labs: '@LABRCNTIP'$ (procalcitonin:4,lacticidven:4) )No results found for this or any previous visit (from the past 240 hour(s)).   Radiological Exams on Admission: Ct Abdomen Pelvis W Contrast  Result Date: 08/12/2018 CLINICAL DATA:  Abdominal pain. EXAM: CT ABDOMEN AND PELVIS WITH CONTRAST TECHNIQUE: Multidetector CT imaging of the abdomen and pelvis was performed using the standard protocol following bolus administration of intravenous contrast. CONTRAST:  129m OMNIPAQUE IOHEXOL 300 MG/ML  SOLN COMPARISON:  07/21/2018 FINDINGS: Lower chest: Emphysema. Hepatobiliary: No suspicious focal abnormality within the liver parenchyma. Mild intrahepatic biliary duct dilatation noted. Gallbladder surgically absent. Extrahepatic common duct dilated up to 14 mm diameter. Common bile duct tapers abruptly in the head of the pancreas, near the ampulla. Pancreas: Pancreatic tail markedly atrophic with 16 mm cystic focus identified on 28/3. Head of pancreas appears expanded and ill-defined with some associated edema. Numerous tiny calcifications in the pancreatic parenchyma are compatible with chronic pancreatitis. Spleen: No splenomegaly. No focal mass lesion. Adrenals/Urinary Tract: No adrenal nodule or mass. Kidneys  unremarkable. No evidence for hydroureter. Bladder is decompressed. Stomach/Bowel: Stomach is unremarkable. No gastric wall thickening. No evidence of outlet obstruction. Mild wall thickening noted in the gastric antrum. Duodenal bulb shows substantial irregular wall thickening and edema as does the descending and proximal transverse segments of the duodenum. Jejunal loops unremarkable. The terminal ileum  is normal. The appendix is normal. Diverticuli are seen scattered along the entire length of the colon without CT findings of diverticulitis. Vascular/Lymphatic: There is abdominal aortic atherosclerosis without aneurysm. Small lymph nodes are seen in the region of the pancreatic head. 12 mm short axis hepato duodenal ligament lymph node is associated. Reproductive: The uterus is unremarkable.  There is no adnexal mass. Other: No intraperitoneal free fluid. Musculoskeletal: No worrisome lytic or sclerotic osseous abnormality. IMPRESSION: 1. Abnormal wall thickening in the distal stomach and descending/proximal transverse duodenum. This finding is associated with edema and ill definition involving the head of pancreas on a background of features suggesting chronic pancreatitis. Intra and extrahepatic biliary duct dilatation noted with abrupt tapering of the common bile duct in the head of the pancreas near the ampulla. There are some scattered peripancreatic and hepato duodenal ligament lymph nodes. Imaging features may be related to pancreatitis resulting in narrowing of the common bile duct and biliary dilatation. However, neoplasm in the head of the pancreas or inflammatory/peptic ulcer disease in the distal stomach/duodenum are also considerations. Close follow-up recommended. 2. Atrophy in the tail of pancreas with 16 mm low-density, probably cystic lesion associated in the pancreatic tail region, potentially pseudocyst. Close follow-up recommended. Electronically Signed   By: Misty Stanley M.D.   On:  08/12/2018 19:07    EKG: Independently reviewed.  It shows normal sinus rhythm with T wave inversions in the lateral lead evidence of septal infarct indeterminate when this happened.  Diffuse ST and T wave inversion is new since previous EKG on 07/21/2018.  Assessment/Plan Principal Problem:   Gastric outlet obstruction Active Problems:   HYPERCHOLESTEROLEMIA   Essential hypertension, benign   Fatty liver   Chronic pain   EKG abnormalities     1.  Gastric outlet obstruction: Secondary to chronic pancreatitis with mass involving the head of the pancreas or inflammation.  Patient will be admitted to medical service.  Gastroenterology following.  Supportive care pain management and nausea management.  Keep n.p.o. for now until seen by GI.  2.  Diabetes: Control at this point.  Has had previous DKA's but not in DKA right now.  Sliding scale insulin will be continued with IV fluids.  3.  Essential hypertension: Blood pressure is controlled.  Continue to monitor.  4. chronic pain: Continue home regimen and monitor.  5.  Abnormal EKG: Patient is not exhibiting any cardiac causes.  Recheck EKG in the morning.  Check troponin.  Most likely noncardiac but also diabetic could have had a silent infarct since discharge.   DVT prophylaxis: Lovenox Code Status: Full code Family Communication: No family at bedside Disposition Plan: To be determined Consults called: Dr. Arelia Longest of Gastroenterology Admission status: Inpatient  Severity of Illness: The appropriate patient status for this patient is INPATIENT. Inpatient status is judged to be reasonable and necessary in order to provide the required intensity of service to ensure the patient's safety. The patient's presenting symptoms, physical exam findings, and initial radiographic and laboratory data in the context of their chronic comorbidities is felt to place them at high risk for further clinical deterioration. Furthermore, it is not  anticipated that the patient will be medically stable for discharge from the hospital within 2 midnights of admission. The following factors support the patient status of inpatient.   " The patient's presenting symptoms include nausea vomiting abdominal pain. " The worrisome physical exam findings include diffuse abdominal tenderness. " The initial radiographic and laboratory data are worrisome because of  evidence of gastric outlet obstruction. " The chronic co-morbidities include insulin-dependent diabetes.   * I certify that at the point of admission it is my clinical judgment that the patient will require inpatient hospital care spanning beyond 2 midnights from the point of admission due to high intensity of service, high risk for further deterioration and high frequency of surveillance required.Barbette Merino MD [facility] Pager [phone]  If 7PM-7AM, please contact night-coverage [email or web address] Password [facility]  08/12/2018, 9:52 PM

## 2018-08-12 NOTE — Telephone Encounter (Signed)
Agree with ED visit.

## 2018-08-12 NOTE — ED Notes (Addendum)
ED TO INPATIENT HANDOFF REPORT  ED Nurse Name and Phone #:  [phone]  S Name/Age/Gender Mallory Stewart 58 y.o. female Room/Bed: 026C/026C  Code Status   Code Status: Prior  Home/SNF/Other Home Patient oriented to: self, place, time and situation Is this baseline? Yes   Triage Complete: Triage complete  Chief Complaint ABD PAIN,VOMITING  Triage Note Pt in c/o nausea & Vomiting with recent hospitalization for DKA last week, pt reports pain with deep inspiration, pt reports vomiting continuous vomiting x 4 days, pt denies diarrhea, pt A&O x4   Allergies Allergies  Allergen Reactions  . Gluten Meal Other (See Comments)    unk  . Simvastatin Other (See Comments)    Elevated lfts?    Level of Care/Admitting Diagnosis ED Disposition    ED Disposition Condition Comment   Admit  Hospital Area: [facility] [[id number]]  Level of Care: Med-Surg [16]  Diagnosis: Gastric outlet obstruction [[id number]]  Admitting Physician: Rometta Emery [[id number]]  Attending Physician: Rometta Emery [[id number]]  Estimated length of stay: past midnight tomorrow  Certification:: I certify this patient will need inpatient services for at least 2 midnights  PT Class (Do Not Modify): Inpatient [101]  PT Acc Code (Do Not Modify): Private [1]       B Medical/Surgery History Past Medical History:  Diagnosis Date  . Diabetes (HCC)   . Hypertension   . Pancreatitis    Past Surgical History:  Procedure Laterality Date  . CHOLECYSTECTOMY N/A 07/28/2016   Procedure: LAPAROSCOPIC CHOLECYSTECTOMY WITH INTRAOPERATIVE CHOLANGIOGRAM;  Surgeon: Rodman Pickle, MD;  Location: [facility] OR;  Service: General;  Laterality: N/A;  . LEEP  1990's  . ORIF FOOT FRACTURE  09/2008   L 5th metatarsal  . REFRACTIVE SURGERY  2000  . TONSILLECTOMY       A IV Location/Drains/Wounds Patient Lines/Drains/Airways Status   Active Line/Drains/Airways    Name:   Placement date:   Placement time:    Site:   Days:   Peripheral IV 08/12/18 Right Arm   08/12/18    1721    Arm   less than 1   Incision (Closed) 07/28/16 Abdomen Other (Comment)   07/28/16    1337     745   Incision - 3 Ports Abdomen Umbilicus Upper;Medial Right;Lateral   07/28/16    1305     745          Intake/Output Last 24 hours No intake or output data in the 24 hours ending 08/12/18 2045  Labs/Imaging Results for orders placed or performed during the hospital encounter of 08/12/18 (from the past 48 hour(s))  Lipase, blood     Status: Abnormal   Collection Time: 08/12/18  3:36 PM  Result Value Ref Range   Lipase 116 (H) 11 - 51 U/L    Comment: Performed at [facility], [address] [place], [place] [address]  Comprehensive metabolic panel     Status: Abnormal   Collection Time: 08/12/18  3:36 PM  Result Value Ref Range   Sodium 141 135 - 145 mmol/L   Potassium 3.6 3.5 - 5.1 mmol/L   Chloride 102 98 - 111 mmol/L   CO2 24 22 - 32 mmol/L   Glucose, Bld 120 (H) 70 - 99 mg/dL   BUN 6 6 - 20 mg/dL   Creatinine, Ser 3.01 0.44 - 1.00 mg/dL   Calcium 9.8 8.9 - 60.1 mg/dL   Total Protein 7.1 6.5 - 8.1 g/dL  Albumin 3.4 (L) 3.5 - 5.0 g/dL   AST 96 (H) 15 - 41 U/L   ALT 125 (H) 0 - 44 U/L   Alkaline Phosphatase 486 (H) 38 - 126 U/L   Total Bilirubin 1.1 0.3 - 1.2 mg/dL   GFR calc non Af Amer >60 >60 mL/min   GFR calc Af Amer >60 >60 mL/min   Anion gap 15 5 - 15    Comment: Performed at [facility], [address] [place], [place] [address]  CBC     Status: None   Collection Time: 08/12/18  3:36 PM  Result Value Ref Range   WBC 7.5 4.0 - 10.5 K/uL   RBC 4.01 3.87 - 5.11 MIL/uL   Hemoglobin 12.2 12.0 - 15.0 g/dL   HCT 60.4 54.0 - 98.1 %   MCV 90.5 80.0 - 100.0 fL   MCH 30.4 26.0 - 34.0 pg   MCHC 33.6 30.0 - 36.0 g/dL   RDW 19.1 47.8 - 29.5 %   Platelets 182 150 - 400 K/uL   nRBC 0.0 0.0 - 0.2 %    Comment: Performed at [facility], [address], [place], [place] [address]  I-Stat  beta hCG blood, ED     Status: None   Collection Time: 08/12/18  3:56 PM  Result Value Ref Range   I-stat hCG, quantitative <5.0 <5 mIU/mL   Comment 3            Comment:   GEST. AGE      CONC.  (mIU/mL)   <=1 WEEK        5 - 50     2 WEEKS       50 - 500     3 WEEKS       100 - 10,000     4 WEEKS     1,000 - 30,000        FEMALE AND NON-PREGNANT FEMALE:     LESS THAN 5 mIU/mL   Urinalysis, Routine w reflex microscopic     Status: Abnormal   Collection Time: 08/12/18  4:40 PM  Result Value Ref Range   Color, Urine AMBER (A) YELLOW    Comment: BIOCHEMICALS MAY BE AFFECTED BY COLOR   APPearance HAZY (A) CLEAR   Specific Gravity, Urine 1.029 1.005 - 1.030   pH 5.0 5.0 - 8.0   Glucose, UA NEGATIVE NEGATIVE mg/dL   Hgb urine dipstick NEGATIVE NEGATIVE   Bilirubin Urine NEGATIVE NEGATIVE   Ketones, ur 5 (A) NEGATIVE mg/dL   Protein, ur 30 (A) NEGATIVE mg/dL   Nitrite NEGATIVE NEGATIVE   Leukocytes,Ua NEGATIVE NEGATIVE   RBC / HPF 0-5 0 - 5 RBC/hpf   WBC, UA 0-5 0 - 5 WBC/hpf   Bacteria, UA NONE SEEN NONE SEEN   Squamous Epithelial / LPF 0-5 0 - 5   Mucus PRESENT     Comment: Performed at [facility], [address] [place], [place] [address]   Ct Abdomen Pelvis W Contrast  Result Date: 08/12/2018 CLINICAL DATA:  Abdominal pain. EXAM: CT ABDOMEN AND PELVIS WITH CONTRAST TECHNIQUE: Multidetector CT imaging of the abdomen and pelvis was performed using the standard protocol following bolus administration of intravenous contrast. CONTRAST:  OMNIPAQUE IOHEXOL 300 MG/ML  SOLN COMPARISON:  07/21/2018 FINDINGS: Lower chest: Emphysema. Hepatobiliary: No suspicious focal abnormality within the liver parenchyma. Mild intrahepatic biliary duct dilatation noted. Gallbladder surgically absent. Extrahepatic common duct dilated up to 14 mm  diameter. Common bile duct tapers abruptly in the head of the pancreas, near the ampulla. Pancreas: Pancreatic tail markedly atrophic with 16 mm cystic  focus identified on 28/3. Head of pancreas appears expanded and ill-defined with some associated edema. Numerous tiny calcifications in the pancreatic parenchyma are compatible with chronic pancreatitis. Spleen: No splenomegaly. No focal mass lesion. Adrenals/Urinary Tract: No adrenal nodule or mass. Kidneys unremarkable. No evidence for hydroureter. Bladder is decompressed. Stomach/Bowel: Stomach is unremarkable. No gastric wall thickening. No evidence of outlet obstruction. Mild wall thickening noted in the gastric antrum. Duodenal bulb shows substantial irregular wall thickening and edema as does the descending and proximal transverse segments of the duodenum. Jejunal loops unremarkable. The terminal ileum is normal. The appendix is normal. Diverticuli are seen scattered along the entire length of the colon without CT findings of diverticulitis. Vascular/Lymphatic: There is abdominal aortic atherosclerosis without aneurysm. Small lymph nodes are seen in the region of the pancreatic head. 12 mm short axis hepato duodenal ligament lymph node is associated. Reproductive: The uterus is unremarkable.  There is no adnexal mass. Other: No intraperitoneal free fluid. Musculoskeletal: No worrisome lytic or sclerotic osseous abnormality. IMPRESSION: 1. Abnormal wall thickening in the distal stomach and descending/proximal transverse duodenum. This finding is associated with edema and ill definition involving the head of pancreas on a background of features suggesting chronic pancreatitis. Intra and extrahepatic biliary duct dilatation noted with abrupt tapering of the common bile duct in the head of the pancreas near the ampulla. There are some scattered peripancreatic and hepato duodenal ligament lymph nodes. Imaging features may be related to pancreatitis resulting in narrowing of the common bile duct and biliary dilatation. However, neoplasm in the head of the pancreas or inflammatory/peptic ulcer disease in the distal  stomach/duodenum are also considerations. Close follow-up recommended. 2. Atrophy in the tail of pancreas with 16 mm low-density, probably cystic lesion associated in the pancreatic tail region, potentially pseudocyst. Close follow-up recommended. Electronically Signed   By: Kennith CenterEric  Mansell M.D.   On: 08/12/2018 19:07    Pending Labs [facility] (From admission, onward)    Start     Ordered   Signed and Held  CBC  (enoxaparin (LOVENOX)    CrCl >/= 30 ml/min)  Once,   R    Comments:  Baseline for enoxaparin therapy IF NOT ALREADY DRAWN.  Notify MD if PLT < 100 K.    Signed and Held   Signed and Held  Creatinine, serum  (enoxaparin (LOVENOX)    CrCl >/= 30 ml/min)  Once,   R    Comments:  Baseline for enoxaparin therapy IF NOT ALREADY DRAWN.    Signed and Held   Signed and Held  Creatinine, serum  (enoxaparin (LOVENOX)    CrCl >/= 30 ml/min)  Weekly,   R    Comments:  while on enoxaparin therapy    Signed and Held   Signed and Held  Comprehensive metabolic panel  Tomorrow morning,   R     Signed and Held   Signed and Held  CBC  Tomorrow morning,   R     Signed and Held          Vitals/Pain Today's Vitals   08/12/18 1900 08/12/18 1930 08/12/18 2000 08/12/18 2030  BP: 130/73 119/73 117/73 119/72  Pulse: 74 69 64 67  Resp: 14     Temp:      TempSrc:      SpO2: 100% 100% 100% 99%  Weight:  Height:      PainSc:        Isolation Precautions No active isolations  Medications Medications  sodium chloride flush (NS) 0.9 % injection 3 mL (3 mLs Intravenous Given 08/12/18 1723)  haloperidol lactate (HALDOL) injection 3 mg (3 mg Intravenous Given 08/12/18 1722)  iohexol (OMNIPAQUE) 300 MG/ML solution 100 mL (100 mLs Intravenous Contrast Given 08/12/18 1739)  sodium chloride 0.9 % bolus 1,000 mL (1,000 mLs Intravenous New Bag/Given 08/12/18 1900)  morphine 4 MG/ML injection 4 mg (4 mg Intravenous Given 08/12/18 1859)    Mobility walks Low fall risk   Focused  Assessments    R Recommendations: See Admitting Provider Note  Report given to:   Additional Notes:

## 2018-08-12 NOTE — Telephone Encounter (Signed)
Pt was seen 08/01/18; pt recently in hospital.pt has virtual appt on 08/13/18 with GI; pt is not able to keep anything down for 3 days, zofran not helping N&V. Pt has had 1/2 c of tea this AM and has vomited x 3 this morning.FBS good at 114. No diarrhea but upper and lower abd pain at pain level 8. Pt said is hard to breathe due to vomiting so much. Back is also in spasms. pts mouth is not dry but pt said she thinks that is about to start. Pt going to [facility] ED now. FYI to Dr Ermalene Searing.

## 2018-08-12 NOTE — Consult Note (Signed)
Consultation  Referring Provider:     [facility] Primary Care Physician:  [name], MD Primary Gastroenterologist:       New  Reason for Consultation:     Gastric outlet obstruction, abnormal pancreas w/ biliary dilation     Impression / Plan:   Gastric outlet obstruction which appears to be due to a pancreatic mass with edema and swelling in the duodenum, most likely related to chronic pancreatitis which is also causing biliary obstruction and dilation, pancreatic cancer not excluded.  Etiology of chronic pancreatitis appears to be alcohol in my opinion.  At this point the biliary obstruction is not a clinical problem.  These problems are the likely cause of her weight loss.  She will need an EGD at least, and probably an endoscopic ultrasound.  She will be kept n.p.o. and I think at least one or both of these could be accomplished tomorrow but I will defer to the regular GI team and Dr. Rush Landmark as to the timing.  Orders not yet placed.  The risks and benefits as well as alternatives of endoscopic procedure(s) have been discussed and reviewed. All questions answered. The patient agrees to proceed.   Gatha Mayer, MD, [facility] 08/12/2018 9:51 PM Pager [phone]         HPI:   Mallory Stewart is a 58 y.o. female with a history of at least a few episodes of pancreatitis going back a number of years attributed to alcohol around 2014 (had small fluid collection near head and duodenum then) when she was admitted to [facility], status post laparoscopic cholecystectomy in 2018 when she was admitted with pancreatitis (chronic cholecystitis no stones on pathology), who was admitted for the second time in a month with nausea and vomiting problems.  Last month she had diabetic ketoacidosis picture.  CT scanning then showed a dilated stomach and a small amount of fluid surrounding the duodenum and between the pancreatic head and duodenum.  She had  a great deal of nausea and vomiting then, was treated with an NG tube, improved and tolerated a diet and was discharged with plans for outpatient follow-up actually tomorrow with Dr. Havery Moros.  For the past several days she has had more problems with postprandial epigastric pain and nausea and vomiting that got severe to the point where she presented for evaluation in the emergency department.  She has had chills but no fever.  She thinks her urine is a bit dark.  She drinks regularly at least 5 days a week, says she has not had anything in a month.  Drinking up to 2 glasses of wine on a daily basis several days a week at least it seems for a number of years.  30+ pound weight loss recently.  Past Medical History:  Diagnosis Date   B12 deficiency    Concussion    DDD (degenerative disc disease), cervical    Diabetes ([place])    DKA, type 2 ([place])    Hypertension    Neuropathy    Pancreatitis    Seasonal allergies     Past Surgical History:  Procedure Laterality Date   CHOLECYSTECTOMY N/A 07/28/2016   Procedure: LAPAROSCOPIC CHOLECYSTECTOMY WITH INTRAOPERATIVE CHOLANGIOGRAM;  Surgeon: Mickeal Skinner, MD;  Location: [place];  Service: General;  Laterality: N/A;   LEEP  1990's   ORIF FOOT FRACTURE  09/2008   L 5th metatarsal   REFRACTIVE SURGERY  2000   TONSILLECTOMY  Family History  Problem Relation Age of Onset   Other Mother        tachycardia.Marland KitchenMarland Kitchen?afib   Stroke Father        after hernia suegery   Prostate cancer Father    Other Father        global transient amnesia, unclear source   Atrial fibrillation Sister    Healthy Brother    Healthy Brother    Coronary artery disease Paternal Grandmother    Heart attack Paternal Grandmother 56   Brain cancer Maternal Grandfather        ?   Cancer Paternal Grandfather        ?   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 Authorizing Provider  fluticasone (FLONASE) 50 MCG/ACT nasal spray Place 2 sprays into both nostrils every morning.   Yes [provider]  Insulin Degludec (TRESIBA FLEXTOUCH) 200 UNIT/ML SOPN Inject 28 Units into the skin every morning for 30 days. Patient taking differently: Inject 40 Units into the skin every morning.  07/24/18 08/23/18 Yes Mikhail, Velta Addison, DO  levocetirizine (XYZAL) 5 MG tablet Take 5 mg by mouth at bedtime.   Yes [provider]  lisinopril (PRINIVIL,ZESTRIL) 40 MG tablet Take 1 tablet (40 mg total) by mouth daily. 12/26/17  Yes Bedsole, Amy E, MD  metoprolol tartrate (LOPRESSOR) 50 MG tablet TAKE 1 TABLET BY MOUTH TWICE A DAY Patient taking differently: Take 50 mg by mouth 2 (two) times daily.  06/02/18  Yes Bedsole, Amy E, MD  Multiple Vitamin (MULTIVITAMIN WITH MINERALS) TABS tablet Take 1 tablet by mouth daily. 07/25/18  Yes Mikhail, Velta Addison, DO  ondansetron (ZOFRAN) 4 MG tablet Take 1 tablet (4 mg total) by mouth every 8 (eight) hours as needed for nausea or vomiting. 08/01/18  Yes Bedsole, Amy E, MD  Blood Glucose Monitoring Suppl (ONE TOUCH ULTRA 2) w/Device KIT Use to check blood sugars two times day 08/01/18   Diona Browner, Amy E, MD  gabapentin (NEURONTIN) 300 MG capsule Take 4 capsules (1,200 mg total) by mouth at bedtime. Patient not taking: Reported  on 08/01/2018 04/28/18   Bedsole, Amy E, MD  glucose blood (ONE TOUCH ULTRA TEST) test strip CHECK BLOOD SUGAR TWO TIMES DAILY AS DIRECTED 08/01/18   Bedsole, Amy E, MD  Insulin Pen Needle (PEN NEEDLES) 31G X 5 MM MISC 1 application by Does not apply route daily. 07/24/18   Mikhail, Velta Addison, DO  Lancets [facility] ULTRASOFT) lancets Use to check blood sugar two times a day. 08/01/18   [name], MD    Current Facility-Administered Medications  Medication Dose Route Frequency Provider Last Rate Last Dose  0.9 %  sodium chloride infusion   Intravenous Continuous Garba, Mohammad L, MD       enoxaparin (LOVENOX) injection 40 mg  40 mg Subcutaneous Q24H Garba, Mohammad L, MD       ondansetron (ZOFRAN) tablet 4 mg  4 mg Oral Q6H PRN Elwyn Reach, MD       Or   ondansetron (ZOFRAN) injection 4 mg  4 mg Intravenous Q6H PRN Elwyn Reach, MD        Allergies as of 08/12/2018 - Review Complete 08/12/2018  Allergen Reaction Noted   Gluten meal Other (See Comments) 07/22/2018   Simvastatin Other (See Comments) 04/06/2011     Review of Systems:    This is positive for those things mentioned in the HPI, also positive for back pain All other review of systems are negative.       Physical Exam:  Vital signs in last 24 hours: Temp:  [97.8 F (36.6 C)-98.2 F (36.8 C)] 97.8 F (36.6 C) (04/07 2105) Pulse Rate:  [64-91] 65 (04/07 2105) Resp:  [14-16] 16 (04/07 2105) BP: (110-139)/(70-95) 110/70 (04/07 2105) SpO2:  [96 %-100 %] 100 % (04/07 2105) Weight:  [65.5 kg] 65.5 kg (04/07 1507)    General:  Well-developed, well-nourished and in no acute distress Eyes:  anicteric. ENT:   Mouth and posterior pharynx free of lesions.  Neck:   supple w/o thyromegaly or mass.  Lungs: Clear to auscultation bilaterally. Heart:  S1S2, no rubs, murmurs, gallops. Abdomen:  soft, mildly tender epigastrium, no hepatosplenomegaly, hernia, or mass and BS+.  Lymph:  no cervical or  supraclavicular adenopathy. Extremities:   no edema Skin   no rash. Neuro:  A&O x 3.  Psych:  appropriate mood and  Affect.   Data Reviewed:   LAB RESULTS: Recent Labs    08/12/18 1536  WBC 7.5  HGB 12.2  HCT 36.3  PLT 182   BMET Recent Labs    08/12/18 1536  NA 141  K 3.6  CL 102  CO2 24  GLUCOSE 120*  BUN 6  CREATININE 0.66  CALCIUM 9.8   LFT Recent Labs    08/12/18 1536  PROT 7.1  ALBUMIN 3.4*  AST 96*  ALT 125*  ALKPHOS 486*  BILITOT 1.1   PT/INR No results for input(s): LABPROT, INR in the last 72 hours.  STUDIES: Ct Abdomen Pelvis W Contrast  Result Date: 08/12/2018 CLINICAL DATA:  Abdominal pain. EXAM: CT ABDOMEN AND PELVIS WITH CONTRAST TECHNIQUE: Multidetector CT imaging of the abdomen and pelvis was performed using the standard protocol following bolus administration of intravenous contrast. CONTRAST:  172m OMNIPAQUE IOHEXOL 300 MG/ML  SOLN COMPARISON:  07/21/2018 FINDINGS: Lower chest: Emphysema. Hepatobiliary: No suspicious focal abnormality within the liver parenchyma. Mild intrahepatic biliary duct dilatation noted. Gallbladder surgically absent. Extrahepatic common duct dilated up to 14 mm diameter. Common bile duct tapers abruptly in the head of the pancreas, near the ampulla. Pancreas: Pancreatic tail markedly atrophic with 16 mm cystic focus identified on 28/3. Head of pancreas appears expanded and ill-defined with some associated edema. Numerous tiny calcifications in the pancreatic parenchyma are compatible with chronic pancreatitis. Spleen: No splenomegaly. No focal mass lesion. Adrenals/Urinary Tract: No adrenal nodule or mass. Kidneys unremarkable. No evidence for hydroureter. Bladder is decompressed. Stomach/Bowel: Stomach is unremarkable. No gastric wall thickening. No evidence of outlet obstruction. Mild wall thickening noted in the gastric antrum. Duodenal bulb shows substantial irregular wall thickening and edema as does the descending and  proximal transverse segments of the duodenum. Jejunal loops unremarkable. The terminal ileum is normal. The appendix is normal. Diverticuli are seen scattered along the entire length of the colon without CT findings of diverticulitis. Vascular/Lymphatic: There is abdominal aortic atherosclerosis without aneurysm. Small lymph nodes are seen in the region of the pancreatic head. 12 mm short axis hepato duodenal ligament lymph node is associated. Reproductive: The uterus is unremarkable.  There is no adnexal mass. Other: No intraperitoneal free fluid. Musculoskeletal: No worrisome lytic or sclerotic osseous abnormality. IMPRESSION: 1. Abnormal wall thickening in the distal stomach and descending/proximal transverse duodenum. This finding is associated with edema and ill definition involving the head of pancreas on a background of features suggesting chronic pancreatitis. Intra and extrahepatic biliary duct dilatation noted with abrupt tapering of the common bile duct in the head of the pancreas near the ampulla. There are some scattered peripancreatic and hepato duodenal ligament lymph nodes. Imaging features may be related to pancreatitis resulting in narrowing of the common bile duct and biliary dilatation. However, neoplasm in the head of the pancreas or inflammatory/peptic ulcer disease in the distal stomach/duodenum are also considerations. Close follow-up recommended. 2. Atrophy in the tail of pancreas with 16 mm low-density, probably cystic lesion associated in the pancreatic tail region, potentially pseudocyst. Close follow-up recommended. Electronically Signed   By: Misty Stanley M.D.   On: 08/12/2018 19:07   Images reviewed personally  PREVIOUS ENDOSCOPIES:            None   Thanks   LOS: 0 days   '@Mirl Hillery'$  Simonne Maffucci, MD, [facility] @  08/12/2018, 9:49 PM

## 2018-08-12 NOTE — ED Triage Notes (Signed)
Pt in c/o nausea & Vomiting with recent hospitalization for DKA last week, pt reports pain with deep inspiration, pt reports vomiting continuous vomiting x 4 days, pt denies diarrhea, pt A&O x4

## 2018-08-12 NOTE — ED Provider Notes (Signed)
[facility] EMERGENCY DEPARTMENT Provider Note   CSN: [id number] Arrival date & time: 08/12/18  1502    History   Chief Complaint Chief Complaint  Patient presents with  . Emesis    HPI Mallory Stewart is a 58 y.o. female.     HPI   Saturday night nausea, vomiting returned, today was 4 times, yesterday 2 times, despite taking nausea medicine Has been unable to keep things down Today has been the worst Significant abdominal pain, from vomiting so much back is in spasm Epigastric abdominal pain the worst but notes generalized pain Reports pain with burping as well Feels thirsty, afraid to drink water because of nausea Taking nausea medicine at home, vomiting it up so stopped taking it. Not phenergan. Maybe zofran Was scheduled to have webex tomorrow AM, [facility] Sugars have been ok, but has had to bump up insulin  No pain with urination No diarrhea Not eating anything so not sure if constipated. Is passing gas.  No blood emesis Haven't had etoh in 1.32mo Past Medical History:  Diagnosis Date  . B12 deficiency   . Concussion   . DDD (degenerative disc disease), cervical   . Diabetes ([place]   . DKA, type 2 ([facility]   . Hypertension   . Neuropathy   . Pancreatitis   . Seasonal allergies     Patient Active Problem List   Diagnosis Date Noted  . Gastric outlet obstruction 08/12/2018  . EKG abnormalities 08/12/2018  . Abnormal CT of the abdomen 08/01/2018  . Abdominal fullness 08/01/2018  . Protein-calorie malnutrition, severe 07/23/2018  . DKA (diabetic ketoacidoses) ([facility] 07/21/2018  . Vertigo 12/26/2017  . Muscle spasm of back 07/12/2017  . Chronic pain 05/17/2017  . Osteoarthritis of spine with radiculopathy, cervical region 09/17/2016  . Back pain 07/26/2016  . B12 deficiency 10/21/2014  . Neuropathic pain of both legs 02/11/2014  . Pancreatic pseudocyst/cyst 05/06/2013  . Fatty liver 04/27/2013  . Non-intractable vomiting with  nausea 04/06/2011  . Elevated liver enzymes 02/05/2011  . EXCESSIVE OR FREQUENT MENSTRUATION 05/10/2009  . Diabetes mellitus without complication ([place] 089/16/9450 . HYPERCHOLESTEROLEMIA 11/03/2008  . Essential hypertension, benign 09/29/2008  . PAP SMEAR, ABNORMAL 09/29/2008    Past Surgical History:  Procedure Laterality Date  . CHOLECYSTECTOMY N/A 07/28/2016   Procedure: LAPAROSCOPIC CHOLECYSTECTOMY WITH INTRAOPERATIVE CHOLANGIOGRAM;  Surgeon: LMickeal Skinner MD;  [facility]  Service: General;  Laterality: N/A;  . LEEP  1990's  . ORIF FOOT FRACTURE  09/2008   L 5th metatarsal  . REFRACTIVE SURGERY  2000  . TONSILLECTOMY       OB History   No obstetric history on file.      Home Medications    Prior to Admission medications   Medication Sig Start Date End Date Taking? Authorizing Provider  fluticasone (FLONASE) 50 MCG/ACT nasal spray Place 2 sprays into both nostrils every morning.   Yes [provider]  Insulin Degludec (TRESIBA FLEXTOUCH) 200 UNIT/ML SOPN Inject 28 Units into the skin every morning for 30 days. Patient taking differently: Inject 40 Units into the skin every morning.  07/24/18 08/23/18 Yes Mikhail, MVelta Addison DO  levocetirizine (XYZAL) 5 MG tablet Take 5 mg by mouth at bedtime.   Yes [provider]  lisinopril (PRINIVIL,ZESTRIL) 40 MG tablet Take 1 tablet (40 mg total) by mouth daily. 12/26/17  Yes Bedsole, Amy E, MD  metoprolol tartrate (LOPRESSOR) 50 MG tablet TAKE 1 TABLET BY MOUTH TWICE A  DAY Patient taking differently: Take 50 mg by mouth 2 (two) times daily.  06/02/18  Yes Bedsole, Amy E, MD  Multiple Vitamin (MULTIVITAMIN WITH MINERALS) TABS tablet Take 1 tablet by mouth daily. 07/25/18  Yes Mikhail, Velta Addison, DO  ondansetron (ZOFRAN) 4 MG tablet Take 1 tablet (4 mg total) by mouth every 8 (eight) hours as needed for nausea or vomiting. 08/01/18  Yes Bedsole, Amy E, MD  Blood Glucose Monitoring Suppl (ONE TOUCH ULTRA 2) w/Device  KIT Use to check blood sugars two times day 08/01/18   Diona Browner, Amy E, MD  gabapentin (NEURONTIN) 300 MG capsule Take 4 capsules (1,200 mg total) by mouth at bedtime. Patient not taking: Reported on 08/01/2018 04/28/18   Bedsole, Amy E, MD  glucose blood (ONE TOUCH ULTRA TEST) test strip CHECK BLOOD SUGAR TWO TIMES DAILY AS DIRECTED 08/01/18   Bedsole, Amy E, MD  Insulin Pen Needle (PEN NEEDLES) 31G X 5 MM MISC 1 application by Does not apply route daily. 07/24/18   Mikhail, Velta Addison, DO  Lancets [facility] ULTRASOFT) lancets Use to check blood sugar two times a day. 08/01/18   [name], MD    Family History Family History  Problem Relation Age of Onset  . Other Mother        tachycardia.Marland KitchenMarland Kitchen?afib  . Stroke Father        after hernia suegery  . Prostate cancer Father   . Other Father        global transient amnesia, unclear source  . Atrial fibrillation Sister   . Healthy Brother   . Healthy Brother   . Coronary artery disease Paternal Grandmother   . Heart attack Paternal Grandmother 38  . Brain cancer Maternal Grandfather        ?  Marland Kitchen Cancer Paternal Grandfather        ?  Marland Kitchen Breast cancer Maternal Grandmother     Social History Social History   Tobacco Use  . Smoking status: Never Smoker  . Smokeless tobacco: Never Used  Substance Use Topics  . Alcohol use: Yes    Alcohol/week: 7.0 standard drinks    Types: 7 Glasses of wine per week    Comment: wine  . Drug use: No     Allergies   Gluten meal and Simvastatin   Review of Systems Review of Systems  Constitutional: Negative for fever.  HENT: Negative for sore throat.   Eyes: Negative for visual disturbance.  Respiratory: Negative for cough and shortness of breath.   Cardiovascular: Negative for chest pain.  Gastrointestinal: Positive for abdominal pain, nausea and vomiting. Negative for constipation and diarrhea.  Genitourinary: Negative for difficulty urinating and dysuria.  Musculoskeletal: Negative for back  pain and neck pain.  Skin: Negative for rash.  Neurological: Negative for syncope and headaches.     Physical Exam Updated Vital Signs BP 128/76 (BP Location: Left Arm)   Pulse (!) 57 Comment: RN notified  Temp 97.8 F (36.6 C) (Oral)   Resp 17   Ht '5\' 9"'$  (1.753 m)   Wt 65.5 kg   SpO2 99%   BMI 21.31 kg/m   Physical Exam Vitals signs and nursing note reviewed.  Constitutional:      General: She is not in acute distress.    Appearance: She is well-developed. She is not diaphoretic.  HENT:     Head: Normocephalic and atraumatic.  Eyes:     Conjunctiva/sclera: Conjunctivae normal.  Neck:     Musculoskeletal: Normal range of motion.  Cardiovascular:     Rate and Rhythm: Normal rate and regular rhythm.  Pulmonary:     Effort: Pulmonary effort is normal. No respiratory distress.  Abdominal:     General: There is no distension.     Palpations: Abdomen is soft.     Tenderness: There is abdominal tenderness (ruq, epigastric). There is no guarding.  Musculoskeletal:        General: No tenderness.  Skin:    General: Skin is warm and dry.     Findings: No erythema or rash.  Neurological:     Mental Status: She is alert and oriented to person, place, and time.      ED Treatments / Results  Labs (all labs ordered are listed, but only abnormal results are displayed) Labs Reviewed  LIPASE, BLOOD - Abnormal; Notable for the following components:      Result Value   Lipase 116 (*)    All other components within normal limits  COMPREHENSIVE METABOLIC PANEL - Abnormal; Notable for the following components:   Glucose, Bld 120 (*)    Albumin 3.4 (*)    AST 96 (*)    ALT 125 (*)    Alkaline Phosphatase 486 (*)    All other components within normal limits  URINALYSIS, ROUTINE W REFLEX MICROSCOPIC - Abnormal; Notable for the following components:   Color, Urine AMBER (*)    APPearance HAZY (*)    Ketones, ur 5 (*)    Protein, ur 30 (*)    All other components within normal  limits  COMPREHENSIVE METABOLIC PANEL - Abnormal; Notable for the following components:   Potassium 3.0 (*)    Glucose, Bld 56 (*)    Calcium 8.7 (*)    Total Protein 5.8 (*)    Albumin 2.7 (*)    AST 62 (*)    ALT 86 (*)    Alkaline Phosphatase 406 (*)    All other components within normal limits  CBC - Abnormal; Notable for the following components:   RBC 3.28 (*)    Hemoglobin 9.7 (*)    HCT 30.1 (*)    All other components within normal limits  CBC  I-STAT BETA HCG BLOOD, ED ([facility], [facility], [facility] ONLY)    EKG EKG Interpretation  Date/Time:  Tuesday August 12 2018 15:21:24 EDT Ventricular Rate:  96 PR Interval:  126 QRS Duration: 64 QT Interval:  386 QTC Calculation: 487 R Axis:   81 Text Interpretation:  Normal sinus rhythm Septal infarct , age undetermined ST & T wave abnormality, consider inferior ischemia ST & T wave abnormality, consider anterolateral ischemia Abnormal ECG Since prior ECG, has diffuse ST and TW abnormalities Confirmed by Gareth Morgan [id number]) on 08/12/2018 7:22:23 PM   Radiology Ct Abdomen Pelvis W Contrast  Result Date: 08/12/2018 CLINICAL DATA:  Abdominal pain. EXAM: CT ABDOMEN AND PELVIS WITH CONTRAST TECHNIQUE: Multidetector CT imaging of the abdomen and pelvis was performed using the standard protocol following bolus administration of intravenous contrast. CONTRAST:  16m OMNIPAQUE IOHEXOL 300 MG/ML  SOLN COMPARISON:  07/21/2018 FINDINGS: Lower chest: Emphysema. Hepatobiliary: No suspicious focal abnormality within the liver parenchyma. Mild intrahepatic biliary duct dilatation noted. Gallbladder surgically absent. Extrahepatic common duct dilated up to 14 mm diameter. Common bile duct tapers abruptly in the head of the pancreas, near the ampulla. Pancreas: Pancreatic tail markedly atrophic with 16 mm cystic focus identified on 28/3. Head of pancreas appears expanded and ill-defined with some associated edema. Numerous tiny calcifications in the pancreatic  parenchyma are compatible with chronic pancreatitis. Spleen: No splenomegaly. No focal mass lesion. Adrenals/Urinary Tract: No adrenal nodule or mass. Kidneys unremarkable. No evidence for hydroureter. Bladder is decompressed. Stomach/Bowel: Stomach is unremarkable. No gastric wall thickening. No evidence of outlet obstruction. Mild wall thickening noted in the gastric antrum. Duodenal bulb shows substantial irregular wall thickening and edema as does the descending and proximal transverse segments of the duodenum. Jejunal loops unremarkable. The terminal ileum is normal. The appendix is normal. Diverticuli are seen scattered along the entire length of the colon without CT findings of diverticulitis. Vascular/Lymphatic: There is abdominal aortic atherosclerosis without aneurysm. Small lymph nodes are seen in the region of the pancreatic head. 12 mm short axis hepato duodenal ligament lymph node is associated. Reproductive: The uterus is unremarkable.  There is no adnexal mass. Other: No intraperitoneal free fluid. Musculoskeletal: No worrisome lytic or sclerotic osseous abnormality. IMPRESSION: 1. Abnormal wall thickening in the distal stomach and descending/proximal transverse duodenum. This finding is associated with edema and ill definition involving the head of pancreas on a background of features suggesting chronic pancreatitis. Intra and extrahepatic biliary duct dilatation noted with abrupt tapering of the common bile duct in the head of the pancreas near the ampulla. There are some scattered peripancreatic and hepato duodenal ligament lymph nodes. Imaging features may be related to pancreatitis resulting in narrowing of the common bile duct and biliary dilatation. However, neoplasm in the head of the pancreas or inflammatory/peptic ulcer disease in the distal stomach/duodenum are also considerations. Close follow-up recommended. 2. Atrophy in the tail of pancreas with 16 mm low-density, probably cystic  lesion associated in the pancreatic tail region, potentially pseudocyst. Close follow-up recommended. Electronically Signed   By: Misty Stanley M.D.   On: 08/12/2018 19:07    Procedures Procedures (including critical care time)  Medications Ordered in ED Medications  enoxaparin (LOVENOX) injection 40 mg (40 mg Subcutaneous Given 08/12/18 2201)  0.9 %  sodium chloride infusion ( Intravenous New Bag/Given 08/13/18 0505)  ondansetron (ZOFRAN) tablet 4 mg (has no administration in time range)    Or  ondansetron (ZOFRAN) injection 4 mg (has no administration in time range)  cyclobenzaprine (FLEXERIL) tablet 10 mg (has no administration in time range)  sodium chloride flush (NS) 0.9 % injection 3 mL (3 mLs Intravenous Given 08/12/18 1723)  haloperidol lactate (HALDOL) injection 3 mg (3 mg Intravenous Given 08/12/18 1722)  iohexol (OMNIPAQUE) 300 MG/ML solution 100 mL (100 mLs Intravenous Contrast Given 08/12/18 1739)  sodium chloride 0.9 % bolus 1,000 mL (1,000 mLs Intravenous New Bag/Given 08/12/18 1900)  morphine 4 MG/ML injection 4 mg (4 mg Intravenous Given 08/12/18 1859)  HYDROcodone-acetaminophen (NORCO/VICODIN) 5-325 MG per tablet 1 tablet (1 tablet Oral Given 08/13/18 0435)     Initial Impression / Assessment and Plan / ED Course  I have reviewed the triage vital signs and the nursing notes.  Pertinent labs & imaging results that were available during my care of the patient were reviewed by me and considered in my medical decision making (see chart for details).        58 year old female with a history of diabetes, hypertension pancreatitis, recent admission in March for diabetic ketoacidosis with an abnormal CT scan of abdomen, who chronic cryptitis with return of nausea and vomiting, abdominal pain, with improvement with antiemetics at home.  No sign of urinary tract infection.  No sign of diabetic ketoacidosis.  Transaminitis has worsened since prior admission.  Lipase is elevated, however not  consistent  with acute pancreatitis.  CT scan completed today shows concern for gastric outlet obstruction biliary ductal dilation likely secondary to a pancreatic mass, which may be secondary to malignancy or chronic pancreatitis.  Consulted Dr. Marko Plume, GI, who came to bedside to evaluate the patient.  For the to the hospital given inability to tolerate p.o. at home despite antiemetics and above abnormalities.  Final Clinical Impressions(s) / ED Diagnoses   Final diagnoses:  Non-intractable vomiting with nausea, unspecified vomiting type  Dilation of biliary tract  Pancreatic mass  Gastric out let obstruction    ED Discharge Orders    None       Gareth Morgan, MD 08/13/18 [phone]

## 2018-08-12 NOTE — H&P (View-Only) (Signed)
Consultation  Referring Provider:     [place] [facility] Primary Care Physician:  [name], MD Primary Gastroenterologist:       New  Reason for Consultation:     Gastric outlet obstruction, abnormal pancreas w/ biliary dilation     Impression / Plan:   Gastric outlet obstruction which appears to be due to a pancreatic mass with edema and swelling in the duodenum, most likely related to chronic pancreatitis which is also causing biliary obstruction and dilation, pancreatic cancer not excluded.  Etiology of chronic pancreatitis appears to be alcohol in my opinion.  At this point the biliary obstruction is not a clinical problem.  These problems are the likely cause of her weight loss.  She will need an EGD at least, and probably an endoscopic ultrasound.  She will be kept n.p.o. and I think at least one or both of these could be accomplished tomorrow but I will defer to the regular GI team and Dr. Rush Landmark as to the timing.  Orders not yet placed.  The risks and benefits as well as alternatives of endoscopic procedure(s) have been discussed and reviewed. All questions answered. The patient agrees to proceed.   Gatha Mayer, MD, [facility] 08/12/2018 9:51 PM Pager [phone]         HPI:   Mallory Stewart is a 58 y.o. female with a history of at least a few episodes of pancreatitis going back a number of years attributed to alcohol around 2014 (had small fluid collection near head and duodenum then) when she was admitted to [facility], status post laparoscopic cholecystectomy in 2018 when she was admitted with pancreatitis (chronic cholecystitis no stones on pathology), who was admitted for the second time in a month with nausea and vomiting problems.  Last month she had diabetic ketoacidosis picture.  CT scanning then showed a dilated stomach and a small amount of fluid surrounding the duodenum and between the pancreatic head and duodenum.  She had  a great deal of nausea and vomiting then, was treated with an NG tube, improved and tolerated a diet and was discharged with plans for outpatient follow-up actually tomorrow with Dr. Havery Moros.  For the past several days she has had more problems with postprandial epigastric pain and nausea and vomiting that got severe to the point where she presented for evaluation in the emergency department.  She has had chills but no fever.  She thinks her urine is a bit dark.  She drinks regularly at least 5 days a week, says she has not had anything in a month.  Drinking up to 2 glasses of wine on a daily basis several days a week at least it seems for a number of years.  30+ pound weight loss recently.  Past Medical History:  Diagnosis Date   B12 deficiency    Concussion    DDD (degenerative disc disease), cervical    Diabetes ([place])    DKA, type 2 ([place])    Hypertension    Neuropathy    Pancreatitis    Seasonal allergies     Past Surgical History:  Procedure Laterality Date   CHOLECYSTECTOMY N/A 07/28/2016   Procedure: LAPAROSCOPIC CHOLECYSTECTOMY WITH INTRAOPERATIVE CHOLANGIOGRAM;  Surgeon: Mickeal Skinner, MD;  Location: [place];  Service: General;  Laterality: N/A;   LEEP  1990's   ORIF FOOT FRACTURE  09/2008   L 5th metatarsal   REFRACTIVE SURGERY  2000   TONSILLECTOMY  Family History  Problem Relation Age of Onset   Other Mother        tachycardia.Marland KitchenMarland Kitchen?afib   Stroke Father        after hernia suegery   Prostate cancer Father    Other Father        global transient amnesia, unclear source   Atrial fibrillation Sister    Healthy Brother    Healthy Brother    Coronary artery disease Paternal Grandmother    Heart attack Paternal Grandmother 3   Brain cancer Maternal Grandfather        ?   Cancer Paternal Grandfather        ?   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 Authorizing Provider  fluticasone (FLONASE) 50 MCG/ACT nasal spray Place 2 sprays into both nostrils every morning.   Yes [provider]  Insulin Degludec (TRESIBA FLEXTOUCH) 200 UNIT/ML SOPN Inject 28 Units into the skin every morning for 30 days. Patient taking differently: Inject 40 Units into the skin every morning.  07/24/18 08/23/18 Yes Mikhail, Velta Addison, DO  levocetirizine (XYZAL) 5 MG tablet Take 5 mg by mouth at bedtime.   Yes [provider]  lisinopril (PRINIVIL,ZESTRIL) 40 MG tablet Take 1 tablet (40 mg total) by mouth daily. 12/26/17  Yes Bedsole, Amy E, MD  metoprolol tartrate (LOPRESSOR) 50 MG tablet TAKE 1 TABLET BY MOUTH TWICE A DAY Patient taking differently: Take 50 mg by mouth 2 (two) times daily.  06/02/18  Yes Bedsole, Amy E, MD  Multiple Vitamin (MULTIVITAMIN WITH MINERALS) TABS tablet Take 1 tablet by mouth daily. 07/25/18  Yes Mikhail, Velta Addison, DO  ondansetron (ZOFRAN) 4 MG tablet Take 1 tablet (4 mg total) by mouth every 8 (eight) hours as needed for nausea or vomiting. 08/01/18  Yes Bedsole, Amy E, MD  Blood Glucose Monitoring Suppl (ONE TOUCH ULTRA 2) w/Device KIT Use to check blood sugars two times day 08/01/18   Diona Browner, Amy E, MD  gabapentin (NEURONTIN) 300 MG capsule Take 4 capsules (1,200 mg total) by mouth at bedtime. Patient not taking: Reported  on 08/01/2018 04/28/18   Bedsole, Amy E, MD  glucose blood (ONE TOUCH ULTRA TEST) test strip CHECK BLOOD SUGAR TWO TIMES DAILY AS DIRECTED 08/01/18   Bedsole, Amy E, MD  Insulin Pen Needle (PEN NEEDLES) 31G X 5 MM MISC 1 application by Does not apply route daily. 07/24/18   Mikhail, Velta Addison, DO  Lancets [facility] ULTRASOFT) lancets Use to check blood sugar two times a day. 08/01/18   [name], MD    Current Facility-Administered Medications  Medication Dose Route Frequency Provider Last Rate Last Dose  0.9 %  sodium chloride infusion   Intravenous Continuous Garba, Mohammad L, MD       enoxaparin (LOVENOX) injection 40 mg  40 mg Subcutaneous Q24H Garba, Mohammad L, MD       ondansetron (ZOFRAN) tablet 4 mg  4 mg Oral Q6H PRN Elwyn Reach, MD       Or   ondansetron (ZOFRAN) injection 4 mg  4 mg Intravenous Q6H PRN Elwyn Reach, MD        Allergies as of 08/12/2018 - Review Complete 08/12/2018  Allergen Reaction Noted   Gluten meal Other (See Comments) 07/22/2018   Simvastatin Other (See Comments) 04/06/2011     Review of Systems:    This is positive for those things mentioned in the HPI, also positive for back pain All other review of systems are negative.       Physical Exam:  Vital signs in last 24 hours: Temp:  [97.8 F (36.6 C)-98.2 F (36.8 C)] 97.8 F (36.6 C) (04/07 2105) Pulse Rate:  [64-91] 65 (04/07 2105) Resp:  [14-16] 16 (04/07 2105) BP: (110-139)/(70-95) 110/70 (04/07 2105) SpO2:  [96 %-100 %] 100 % (04/07 2105) Weight:  [65.5 kg] 65.5 kg (04/07 1507)    General:  Well-developed, well-nourished and in no acute distress Eyes:  anicteric. ENT:   Mouth and posterior pharynx free of lesions.  Neck:   supple w/o thyromegaly or mass.  Lungs: Clear to auscultation bilaterally. Heart:  S1S2, no rubs, murmurs, gallops. Abdomen:  soft, mildly tender epigastrium, no hepatosplenomegaly, hernia, or mass and BS+.  Lymph:  no cervical or  supraclavicular adenopathy. Extremities:   no edema Skin   no rash. Neuro:  A&O x 3.  Psych:  appropriate mood and  Affect.   Data Reviewed:   LAB RESULTS: Recent Labs    08/12/18 1536  WBC 7.5  HGB 12.2  HCT 36.3  PLT 182   BMET Recent Labs    08/12/18 1536  NA 141  K 3.6  CL 102  CO2 24  GLUCOSE 120*  BUN 6  CREATININE 0.66  CALCIUM 9.8   LFT Recent Labs    08/12/18 1536  PROT 7.1  ALBUMIN 3.4*  AST 96*  ALT 125*  ALKPHOS 486*  BILITOT 1.1   PT/INR No results for input(s): LABPROT, INR in the last 72 hours.  STUDIES: Ct Abdomen Pelvis W Contrast  Result Date: 08/12/2018 CLINICAL DATA:  Abdominal pain. EXAM: CT ABDOMEN AND PELVIS WITH CONTRAST TECHNIQUE: Multidetector CT imaging of the abdomen and pelvis was performed using the standard protocol following bolus administration of intravenous contrast. CONTRAST:  138m OMNIPAQUE IOHEXOL 300 MG/ML  SOLN COMPARISON:  07/21/2018 FINDINGS: Lower chest: Emphysema. Hepatobiliary: No suspicious focal abnormality within the liver parenchyma. Mild intrahepatic biliary duct dilatation noted. Gallbladder surgically absent. Extrahepatic common duct dilated up to 14 mm diameter. Common bile duct tapers abruptly in the head of the pancreas, near the ampulla. Pancreas: Pancreatic tail markedly atrophic with 16 mm cystic focus identified on 28/3. Head of pancreas appears expanded and ill-defined with some associated edema. Numerous tiny calcifications in the pancreatic parenchyma are compatible with chronic pancreatitis. Spleen: No splenomegaly. No focal mass lesion. Adrenals/Urinary Tract: No adrenal nodule or mass. Kidneys unremarkable. No evidence for hydroureter. Bladder is decompressed. Stomach/Bowel: Stomach is unremarkable. No gastric wall thickening. No evidence of outlet obstruction. Mild wall thickening noted in the gastric antrum. Duodenal bulb shows substantial irregular wall thickening and edema as does the descending and  proximal transverse segments of the duodenum. Jejunal loops unremarkable. The terminal ileum is normal. The appendix is normal. Diverticuli are seen scattered along the entire length of the colon without CT findings of diverticulitis. Vascular/Lymphatic: There is abdominal aortic atherosclerosis without aneurysm. Small lymph nodes are seen in the region of the pancreatic head. 12 mm short axis hepato duodenal ligament lymph node is associated. Reproductive: The uterus is unremarkable.  There is no adnexal mass. Other: No intraperitoneal free fluid. Musculoskeletal: No worrisome lytic or sclerotic osseous abnormality. IMPRESSION: 1. Abnormal wall thickening in the distal stomach and descending/proximal transverse duodenum. This finding is associated with edema and ill definition involving the head of pancreas on a background of features suggesting chronic pancreatitis. Intra and extrahepatic biliary duct dilatation noted with abrupt tapering of the common bile duct in the head of the pancreas near the ampulla. There are some scattered peripancreatic and hepato duodenal ligament lymph nodes. Imaging features may be related to pancreatitis resulting in narrowing of the common bile duct and biliary dilatation. However, neoplasm in the head of the pancreas or inflammatory/peptic ulcer disease in the distal stomach/duodenum are also considerations. Close follow-up recommended. 2. Atrophy in the tail of pancreas with 16 mm low-density, probably cystic lesion associated in the pancreatic tail region, potentially pseudocyst. Close follow-up recommended. Electronically Signed   By: Misty Stanley M.D.   On: 08/12/2018 19:07   Images reviewed personally  PREVIOUS ENDOSCOPIES:            None   Thanks   LOS: 0 days   '@Aleksa Catterton'$  Simonne Maffucci, MD, [facility] @  08/12/2018, 9:49 PM

## 2018-08-13 ENCOUNTER — Encounter (HOSPITAL_COMMUNITY)
Admission: EM | Disposition: A | Discharge status: Home / Self Care | Payer: Self-pay | Source: Home / Self Care | Attending: Family Medicine

## 2018-08-13 ENCOUNTER — Inpatient Hospital Stay (HOSPITAL_COMMUNITY): Payer: 59 | Admitting: Anesthesiology

## 2018-08-13 ENCOUNTER — Other Ambulatory Visit: Payer: Self-pay

## 2018-08-13 ENCOUNTER — Ambulatory Visit: Payer: 59 | Admitting: Gastroenterology

## 2018-08-13 ENCOUNTER — Encounter (HOSPITAL_COMMUNITY): Payer: Self-pay | Admitting: *Deleted

## 2018-08-13 DIAGNOSIS — K862 Cyst of pancreas: Secondary | ICD-10-CM

## 2018-08-13 DIAGNOSIS — K2970 Gastritis, unspecified, without bleeding: Secondary | ICD-10-CM

## 2018-08-13 DIAGNOSIS — K209 Esophagitis, unspecified: Secondary | ICD-10-CM

## 2018-08-13 DIAGNOSIS — K222 Esophageal obstruction: Secondary | ICD-10-CM

## 2018-08-13 HISTORY — PX: UPPER ESOPHAGEAL ENDOSCOPIC ULTRASOUND (EUS): SHX6562

## 2018-08-13 HISTORY — PX: ESOPHAGOGASTRODUODENOSCOPY (EGD) WITH PROPOFOL: SHX5813

## 2018-08-13 LAB — COMPREHENSIVE METABOLIC PANEL
ALT: 86 U/L — ABNORMAL HIGH (ref 0–44)
AST: 62 U/L — ABNORMAL HIGH (ref 15–41)
Albumin: 2.7 g/dL — ABNORMAL LOW (ref 3.5–5.0)
Alkaline Phosphatase: 406 U/L — ABNORMAL HIGH (ref 38–126)
Anion gap: 10 (ref 5–15)
BUN: 6 mg/dL (ref 6–20)
CO2: 25 mmol/L (ref 22–32)
Calcium: 8.7 mg/dL — ABNORMAL LOW (ref 8.9–10.3)
Chloride: 105 mmol/L (ref 98–111)
Creatinine, Ser: 0.56 mg/dL (ref 0.44–1.00)
GFR calc Af Amer: >60 mL/min (ref >60)
GFR calc non Af Amer: >60 mL/min (ref >60)
Glucose, Bld: 56 mg/dL — ABNORMAL LOW (ref 70–99)
Potassium: 3 mmol/L — ABNORMAL LOW (ref 3.5–5.1)
Sodium: 140 mmol/L (ref 135–145)
Total Bilirubin: 0.8 mg/dL (ref 0.3–1.2)
Total Protein: 5.8 g/dL — ABNORMAL LOW (ref 6.5–8.1)

## 2018-08-13 LAB — TROPONIN I: Troponin I: <0.03 ng/mL (ref <0.03)

## 2018-08-13 LAB — GLUCOSE, CAPILLARY
Glucose-Capillary: 34 mg/dL — CL (ref 70–99)
Glucose-Capillary: 74 mg/dL (ref 70–99)
Glucose-Capillary: 26 mg/dL — CL (ref 70–99)
Glucose-Capillary: 105 mg/dL — ABNORMAL HIGH (ref 70–99)
Glucose-Capillary: 31 mg/dL — CL (ref 70–99)
Glucose-Capillary: 88 mg/dL (ref 70–99)
Glucose-Capillary: 140 mg/dL — ABNORMAL HIGH (ref 70–99)

## 2018-08-13 LAB — CBC
HCT: 30.1 % — ABNORMAL LOW (ref 36.0–46.0)
Hemoglobin: 9.7 g/dL — ABNORMAL LOW (ref 12.0–15.0)
MCH: 29.6 pg (ref 26.0–34.0)
MCHC: 32.2 g/dL (ref 30.0–36.0)
MCV: 91.8 fL (ref 80.0–100.0)
Platelets: 179 10*3/uL (ref 150–400)
RBC: 3.28 MIL/uL — ABNORMAL LOW (ref 3.87–5.11)
RDW: 11.9 % (ref 11.5–15.5)
WBC: 5.4 10*3/uL (ref 4.0–10.5)
nRBC: 0 % (ref 0.0–0.2)

## 2018-08-13 SURGERY — ESOPHAGOGASTRODUODENOSCOPY (EGD) WITH PROPOFOL
Anesthesia: General

## 2018-08-13 MED ORDER — DEXTROSE 50 % IV SOLN
INTRAVENOUS | Status: AC
  Filled 2018-08-13: qty 50

## 2018-08-13 MED ORDER — SODIUM CHLORIDE 4 MEQ/ML IV SOLN
INTRAVENOUS | Status: DC
  Administered 2018-08-13: 20:00:00 via INTRAVENOUS
  Filled 2018-08-13 (×2): qty 1000

## 2018-08-13 MED ORDER — INSULIN ASPART 100 UNIT/ML ~~LOC~~ SOLN
0.0000 [IU] | SUBCUTANEOUS | Status: DC
  Administered 2018-08-13: 1 [IU] via SUBCUTANEOUS
  Administered 2018-08-14: 3 [IU] via SUBCUTANEOUS
  Administered 2018-08-14: 2 [IU] via SUBCUTANEOUS
  Administered 2018-08-14: 3 [IU] via SUBCUTANEOUS
  Administered 2018-08-14: 1 [IU] via SUBCUTANEOUS
  Administered 2018-08-14: 2 [IU] via SUBCUTANEOUS

## 2018-08-13 MED ORDER — LACTATED RINGERS IV SOLN
INTRAVENOUS | Status: DC
  Administered 2018-08-13 (×2): via INTRAVENOUS

## 2018-08-13 MED ORDER — MIDAZOLAM HCL 2 MG/2ML IJ SOLN
INTRAMUSCULAR | Status: DC | PRN
  Administered 2018-08-13: 2 mg via INTRAVENOUS

## 2018-08-13 MED ORDER — SUCCINYLCHOLINE CHLORIDE 200 MG/10ML IV SOSY
PREFILLED_SYRINGE | INTRAVENOUS | Status: DC | PRN
  Administered 2018-08-13: 60 mg via INTRAVENOUS

## 2018-08-13 MED ORDER — HYDROCODONE-ACETAMINOPHEN 5-325 MG PO TABS
1.0000 | ORAL_TABLET | Freq: Once | ORAL | Status: AC
  Administered 2018-08-13: 1 via ORAL
  Filled 2018-08-13: qty 1

## 2018-08-13 MED ORDER — POTASSIUM CHLORIDE CRYS ER 20 MEQ PO TBCR
20.0000 meq | EXTENDED_RELEASE_TABLET | Freq: Once | ORAL | Status: AC
  Administered 2018-08-13: 20 meq via ORAL
  Filled 2018-08-13: qty 1

## 2018-08-13 MED ORDER — CYCLOBENZAPRINE HCL 10 MG PO TABS
10.0000 mg | ORAL_TABLET | Freq: Three times a day (TID) | ORAL | Status: DC | PRN
  Administered 2018-08-13 – 2018-08-14 (×4): 10 mg via ORAL
  Filled 2018-08-13 (×4): qty 1

## 2018-08-13 MED ORDER — KCL-LACTATED RINGERS-D5W 20 MEQ/L IV SOLN
INTRAVENOUS | Status: DC
  Administered 2018-08-13: 13:00:00 via INTRAVENOUS
  Filled 2018-08-13: qty 1000

## 2018-08-13 MED ORDER — PROPOFOL 10 MG/ML IV BOLUS
INTRAVENOUS | Status: DC | PRN
  Administered 2018-08-13: 480 mg via INTRAVENOUS

## 2018-08-13 MED ORDER — METOPROLOL TARTRATE 25 MG PO TABS
25.0000 mg | ORAL_TABLET | Freq: Two times a day (BID) | ORAL | Status: DC
  Administered 2018-08-13 – 2018-08-15 (×5): 25 mg via ORAL
  Filled 2018-08-13 (×6): qty 1

## 2018-08-13 MED ORDER — PHENYLEPHRINE 40 MCG/ML (10ML) SYRINGE FOR IV PUSH (FOR BLOOD PRESSURE SUPPORT)
PREFILLED_SYRINGE | INTRAVENOUS | Status: DC | PRN
  Administered 2018-08-13: 80 ug via INTRAVENOUS
  Administered 2018-08-13: 120 ug via INTRAVENOUS
  Administered 2018-08-13: 80 ug via INTRAVENOUS

## 2018-08-13 MED ORDER — DEXTROSE IN LACTATED RINGERS 5 % IV SOLN
INTRAVENOUS | Status: DC
  Administered 2018-08-13: 12:00:00 via INTRAVENOUS

## 2018-08-13 MED ORDER — FENTANYL CITRATE (PF) 250 MCG/5ML IJ SOLN
INTRAMUSCULAR | Status: DC | PRN
  Administered 2018-08-13 (×3): 50 ug via INTRAVENOUS

## 2018-08-13 MED ORDER — DEXTROSE 50 % IV SOLN
25.0000 g | INTRAVENOUS | Status: AC
  Administered 2018-08-13: 25 g via INTRAVENOUS

## 2018-08-13 MED ORDER — LIDOCAINE 2% (20 MG/ML) 5 ML SYRINGE
INTRAMUSCULAR | Status: DC | PRN
  Administered 2018-08-13: 60 mg via INTRAVENOUS

## 2018-08-13 SURGICAL SUPPLY — 15 items

## 2018-08-13 NOTE — Anesthesia Procedure Notes (Signed)
Procedure Name: Intubation Date/Time: 08/13/2018 3:02 PM Performed by: Elayne Snare, CRNA Pre-anesthesia Checklist: Patient identified, Emergency Drugs available, Suction available and Patient being monitored Patient Re-evaluated:Patient Re-evaluated prior to induction Oxygen Delivery Method: Circle System Utilized Preoxygenation: Pre-oxygenation with 100% oxygen Induction Type: IV induction and Rapid sequence Laryngoscope Size: Mac and 4 Grade View: Grade I Tube type: Oral Tube size: 7.0 mm Number of attempts: 1 Airway Equipment and Method: Stylet and Oral airway Placement Confirmation: ETT inserted through vocal cords under direct vision,  positive ETCO2 and breath sounds checked- equal and bilateral Secured at: 21 cm Tube secured with: Tape Dental Injury: Teeth and Oropharynx as per pre-operative assessment

## 2018-08-13 NOTE — Anesthesia Preprocedure Evaluation (Addendum)
Anesthesia Evaluation  Patient identified by MRN, date of birth, ID band Patient awake    Reviewed: Allergy & Precautions, NPO status , Patient's Chart, lab work & pertinent test results  Airway Mallampati: II  TM Distance: >3 FB Neck ROM: Full    Dental no notable dental hx.    Pulmonary neg pulmonary ROS,    Pulmonary exam normal breath sounds clear to auscultation       Cardiovascular hypertension, Pt. on medications and Pt. on home beta blockers Normal cardiovascular exam Rhythm:Regular Rate:Normal  ECG: NSR, rate 61   Neuro/Psych negative neurological ROS  negative psych ROS   GI/Hepatic negative GI ROS, Neg liver ROS,   Endo/Other  diabetes, Insulin Dependent  Renal/GU negative Renal ROS     Musculoskeletal negative musculoskeletal ROS (+)   Abdominal   Peds  Hematology  (+) anemia ,   Anesthesia Other Findings pancreatic inflammation, ? mass.  dilated bile duct  Reproductive/Obstetrics                            Anesthesia Physical Anesthesia Plan  ASA: III  Anesthesia Plan: General   Post-op Pain Management:    Induction: Intravenous and Rapid sequence  PONV Risk Score and Plan: 3 and Ondansetron, Dexamethasone and Treatment may vary due to age or medical condition  Airway Management Planned: Oral ETT  Additional Equipment:   Intra-op Plan:   Post-operative Plan: Extubation in OR  Informed Consent: I have reviewed the patients History and Physical, chart, labs and discussed the procedure including the risks, benefits and alternatives for the proposed anesthesia with the patient or authorized representative who has indicated his/her understanding and acceptance.     Dental advisory given  Plan Discussed with: CRNA  Anesthesia Plan Comments:        Anesthesia Quick Evaluation

## 2018-08-13 NOTE — Progress Notes (Signed)
PROGRESS NOTE    Mallory Stewart  GYF:[id number] DOB: 08/25/1960 DOA: 08/12/2018 PCP: Excell Seltzer, MD   Brief Narrative:  Mallory Stewart is Mallory Stewart 58 y.o. female with medical history significant of diabetes type 1 with recent DKA, hypertension, chronic pancreatitis Who was admitted on March 16 with DKA and discharged home in good health.  Patient returns today due to intractable nausea vomiting and abdominal pain.  Symptoms have been going on for 3 days now.  She has been taking her nausea medicine but not relieved.  She has pain at 8 out of 10 in the epigastric region radiating to her back.  She denied taking NSAIDs or alcohol.  Denied any new medications.  Denied any sick contacts and did not eat outside the home.  Patient was seen in the ER evaluated and found to have gastric outlet obstruction due to pancreatic mass.  She is therefore being admitted for work-up..  ED Course: Vitals and lab work all appear to be within normal.  Glucose is 120.  ALT is 126 AST 96 lipase 116 and alkaline phosphatase of 486 with albumin of 3.4.  Urinalysis essentially negative.  CT abdomen pelvis showed Distal stomach thickening with proximal transverse duodenum.  Finding associated with the head of pancreas suspected mass or inflammation.  Gastric outlet obstruction as Mallory Stewart result of that.  GI was consulted and we are consulted for admission.  Assessment & Plan:   Principal Problem:   Gastric outlet obstruction Active Problems:   HYPERCHOLESTEROLEMIA   Essential hypertension, benign   Fatty liver   Chronic pain   EKG abnormalities   1.  Gastric outlet obstruction: Imaging notable for abnormal wall thickening in the distal stomach and descending/proximal transverse duodenum.  Associated with edema and ill definition involving the head of the pancreas on Mallory Stewart background of features suggesting chronic pancreatitis.  Concern for GOO related to chronic pancreatitis (see image report and GI c/s).   NPO GI c/s,  planning for EGD today  # Biliary Obstruction: related to above, appreciate GI input  2.  Diabetes: A1c 3/18 13.3.   Hypoglycemia this AM, currently NPO Currently hold long acting insulin (40 tresiba daily) Start D5 LR and watch BG carefully SSI Q4 hrs while NPO   3.  Essential hypertension: Blood pressure is controlled.  Resume metoprolol at 25 mg BID (lower dose with holding parameters).  Hold lisinopril with procedure.   4. chronic pain: Continue home regimen and monitor.  5.  Abnormal EKG:  Troponin negative.  No CP or SOB.  EKG with diffuse T wave inversions in II, III, aVF and V3, V4, V5, V6.  Will need outpatient cardiology follow up.  Of note, she had mild troponin elevation in her last hospitalization.   # Elevated LFTs: likely related to above.  Follow acute hepatitis panel.   DVT prophylaxis: lovenox Code Status: full  Family Communication: declined me calling husband, encouraged to let me know if she changes her mind Disposition Plan: pending GI evaluation   Consultants:   GI   Procedures:   none  Antimicrobials: Anti-infectives (From admission, onward)   None     Subjective: Feels hungry today. Feeling Mallory Stewart bit better. Denies CP or SOB not associated with her vomiting.  Objective: Vitals:   08/12/18 2030 08/12/18 2105 08/13/18 0208 08/13/18 0559  BP: 119/72 110/70 106/63 128/76  Pulse: 67 65 63 (!) 57  Resp:  16 17 17   Temp:  97.8 F (36.6 C) 97.6 F (36.4  C) 97.8 F (36.6 C)  TempSrc:  Oral Oral Oral  SpO2: 99% 100% 98% 99%  Weight:      Height:        Intake/Output Summary (Last 24 hours) at 08/13/2018 1213 Last data filed at 08/13/2018 0300 Gross per 24 hour  Intake 7.11 ml  Output --  Net 7.11 ml   Filed Weights   08/12/18 1507  Weight: 65.5 kg    Examination:  General exam: Appears calm and comfortable  Respiratory system: Clear to auscultation. Respiratory effort normal. Cardiovascular system: S1 & S2 heard,  RRR. Gastrointestinal system: Abdomen is nondistended, soft and nontender.  Central nervous system: Alert and oriented. No focal neurological deficits. Extremities: Symmetric 5 x 5 power. Skin: No rashes, lesions or ulcers Psychiatry: Judgement and insight appear normal. Mood & affect appropriate.     Data Reviewed: I have personally reviewed following labs and imaging studies  CBC: Recent Labs  Lab 08/12/18 1536 08/13/18 0430  WBC 7.5 5.4  HGB 12.2 9.7*  HCT 36.3 30.1*  MCV 90.5 91.8  PLT 182 179   Basic Metabolic Panel: Recent Labs  Lab 08/12/18 1536 08/13/18 0430  NA 141 140  K 3.6 3.0*  CL 102 105  CO2 24 25  GLUCOSE 120* 56*  BUN 6 6  CREATININE 0.66 0.56  CALCIUM 9.8 8.7*   GFR: Estimated Creatinine Clearance: 82.2 mL/min (by C-G formula based on SCr of 0.56 mg/dL). Liver Function Tests: Recent Labs  Lab 08/12/18 1536 08/13/18 0430  AST 96* 62*  ALT 125* 86*  ALKPHOS 486* 406*  BILITOT 1.1 0.8  PROT 7.1 5.8*  ALBUMIN 3.4* 2.7*   Recent Labs  Lab 08/12/18 1536  LIPASE 116*   No results for input(s): AMMONIA in the last 168 hours. Coagulation Profile: No results for input(s): INR, PROTIME in the last 168 hours. Cardiac Enzymes: Recent Labs  Lab 08/13/18 1046  TROPONINI <0.03   BNP (last 3 results) No results for input(s): PROBNP in the last 8760 hours. HbA1C: No results for input(s): HGBA1C in the last 72 hours. CBG: Recent Labs  Lab 08/13/18 0859 08/13/18 0947  GLUCAP 34* 74   Lipid Profile: No results for input(s): CHOL, HDL, LDLCALC, TRIG, CHOLHDL, LDLDIRECT in the last 72 hours. Thyroid Function Tests: No results for input(s): TSH, T4TOTAL, FREET4, T3FREE, THYROIDAB in the last 72 hours. Anemia Panel: No results for input(s): VITAMINB12, FOLATE, FERRITIN, TIBC, IRON, RETICCTPCT in the last 72 hours. Sepsis Labs: No results for input(s): PROCALCITON, LATICACIDVEN in the last 168 hours.  No results found for this or any  previous visit (from the past 240 hour(s)).       Radiology Studies: Ct Abdomen Pelvis W Contrast  Result Date: 08/12/2018 CLINICAL DATA:  Abdominal pain. EXAM: CT ABDOMEN AND PELVIS WITH CONTRAST TECHNIQUE: Multidetector CT imaging of the abdomen and pelvis was performed using the standard protocol following bolus administration of intravenous contrast. CONTRAST:  100mL OMNIPAQUE IOHEXOL 300 MG/ML  SOLN COMPARISON:  07/21/2018 FINDINGS: Lower chest: Emphysema. Hepatobiliary: No suspicious focal abnormality within the liver parenchyma. Mild intrahepatic biliary duct dilatation noted. Gallbladder surgically absent. Extrahepatic common duct dilated up to 14 mm diameter. Common bile duct tapers abruptly in the head of the pancreas, near the ampulla. Pancreas: Pancreatic tail markedly atrophic with 16 mm cystic focus identified on 28/3. Head of pancreas appears expanded and ill-defined with some associated edema. Numerous tiny calcifications in the pancreatic parenchyma are compatible with chronic pancreatitis. Spleen: No splenomegaly. No  focal mass lesion. Adrenals/Urinary Tract: No adrenal nodule or mass. Kidneys unremarkable. No evidence for hydroureter. Bladder is decompressed. Stomach/Bowel: Stomach is unremarkable. No gastric wall thickening. No evidence of outlet obstruction. Mild wall thickening noted in the gastric antrum. Duodenal bulb shows substantial irregular wall thickening and edema as does the descending and proximal transverse segments of the duodenum. Jejunal loops unremarkable. The terminal ileum is normal. The appendix is normal. Diverticuli are seen scattered along the entire length of the colon without CT findings of diverticulitis. Vascular/Lymphatic: There is abdominal aortic atherosclerosis without aneurysm. Small lymph nodes are seen in the region of the pancreatic head. 12 mm short axis hepato duodenal ligament lymph node is associated. Reproductive: The uterus is unremarkable.  There  is no adnexal mass. Other: No intraperitoneal free fluid. Musculoskeletal: No worrisome lytic or sclerotic osseous abnormality. IMPRESSION: 1. Abnormal wall thickening in the distal stomach and descending/proximal transverse duodenum. This finding is associated with edema and ill definition involving the head of pancreas on [place] Timothy background of features suggesting chronic pancreatitis. Intra and extrahepatic biliary duct dilatation noted with abrupt tapering of the common bile duct in the head of the pancreas near the ampulla. There are some scattered peripancreatic and hepato duodenal ligament lymph nodes. Imaging features may be related to pancreatitis resulting in narrowing of the common bile duct and biliary dilatation. However, neoplasm in the head of the pancreas or inflammatory/peptic ulcer disease in the distal stomach/duodenum are also considerations. Close follow-up recommended. 2. Atrophy in the tail of pancreas with 16 mm low-density, probably cystic lesion associated in the pancreatic tail region, potentially pseudocyst. Close follow-up recommended. Electronically Signed   By: Kennith Center M.D.   On: 08/12/2018 19:07        Scheduled Meds:  enoxaparin (LOVENOX) injection  40 mg Subcutaneous Q24H   insulin aspart  0-9 Units Subcutaneous Q4H   Continuous Infusions:  sodium chloride 150 mL/hr at 08/13/18 1210   dextrose 5% lactated ringers       LOS: 1 day    Time spent: over 30 min    Lacretia Nicks, MD [facility] Pager AMION  If 7PM-7AM, please contact night-coverage [email or web address] Password [facility] 08/13/2018, 12:13 PM

## 2018-08-13 NOTE — Anesthesia Postprocedure Evaluation (Signed)
Anesthesia Post Note  Patient: Mallory Stewart  Procedure(s) Performed: ESOPHAGOGASTRODUODENOSCOPY (EGD) WITH PROPOFOL (N/A ) UPPER ESOPHAGEAL ENDOSCOPIC ULTRASOUND (EUS) (N/A )     Patient location during evaluation: PACU Anesthesia Type: General Level of consciousness: awake and alert Pain management: pain level controlled Vital Signs Assessment: post-procedure vital signs reviewed and stable Respiratory status: spontaneous breathing, nonlabored ventilation, respiratory function stable and patient connected to nasal cannula oxygen Cardiovascular status: blood pressure returned to baseline and stable Postop Assessment: no apparent nausea or vomiting Anesthetic complications: no    Last Vitals:  Vitals:   08/13/18 1640 08/13/18 1730  BP: (!) 184/99 (!) 157/89  Pulse: 84 63  Resp: 15 12  Temp:  36.6 C  SpO2: 100% 100%    Last Pain:  Vitals:   08/13/18 1730  TempSrc: Oral  PainSc:                  Ryan P Ellender

## 2018-08-13 NOTE — Progress Notes (Signed)
Hypoglycemic Event  CBG: 31  Treatment: D50 25g  Symptoms: No manifestations of Hypoglycemic symptoms  Follow-up CBG: Time:1800 CBG Result:88  Possible Reasons for Event: Pt have been NPO  Due to procedures  Comments/MD notified: awaiting   Maral Lampe B Clarke Peretz

## 2018-08-13 NOTE — Progress Notes (Signed)
Initial Nutrition Assessment  RD working remotely.  DOCUMENTATION CODES:   Not applicable  INTERVENTION:   -RD will follow for diet advancement and supplement as appropriate  NUTRITION DIAGNOSIS:   Inadequate oral intake related to altered GI function as evidenced by NPO status.  GOAL:   Patient will meet greater than or equal to 90% of their needs  MONITOR:   Diet advancement, Labs, Weight trends, Skin, I & O's  REASON FOR ASSESSMENT:   Malnutrition Screening Tool    ASSESSMENT:   Mallory Stewart is a 58 y.o. female with medical history significant of diabetes type 1 with recent DKA, hypertension, chronic pancreatitis Who was admitted on March 16 with DKA and discharged home in good health.  Patient returns today due to intractable nausea vomiting and abdominal pain.  Symptoms have been going on for 3 days now.  She has been taking her nausea medicine but not relieved.  She has pain at 8 out of 10 in the epigastric region radiating to her back.  She denied taking NSAIDs or alcohol.  Denied any new medications.  Denied any sick contacts and did not eat outside the home.  Patient was seen in the ER evaluated and found to have gastric outlet obstruction due to pancreatic mass.  She is therefore being admitted for work-up.  Pt admitted with GOO secondary to chronic pancreatitis with mass involving head of pancreas of inflammation.   Reviewed I/O's: 7 ml x 24 hours  Pt in endo suite at time of visit. Unable to obtain further nutrition-related history at this time.  Pt is familiar to this RD from previous admission. Reviewed wt hx, which reveals a 16.8% wt loss over the past 5 months, which is significant for time frame. Noted pt was identified with malnutrition on prior admission. Suspect pt with malnutrition (worsening), however, unable to identify at this time without further details.   Pt currently NPO. Plan for EGD/EUS today, with possibility of requiring ERCP with stent.    Lab Results  Component Value Date   HGBA1C 13.3 (H) 07/23/2018   PTA DM medications are 40 units insulin degludec q AM.   Labs reviewed: K: 3.0, Lipase: 116 (inpatient orders for glycemic control are 0-9 units insulin aspart every 4 hours).   NUTRITION - FOCUSED PHYSICAL EXAM:    Most Recent Value  Orbital Region  Unable to assess  Upper Arm Region  Unable to assess  Thoracic and Lumbar Region  Unable to assess  Buccal Region  Unable to assess  Temple Region  Unable to assess  Clavicle Bone Region  Unable to assess  Clavicle and Acromion Bone Region  Unable to assess  Scapular Bone Region  Unable to assess  Dorsal Hand  Unable to assess  Patellar Region  Unable to assess  Anterior Thigh Region  Unable to assess  Posterior Calf Region  Unable to assess  Edema (RD Assessment)  Unable to assess  Hair  Unable to assess  Eyes  Unable to assess  Mouth  Unable to assess  Skin  Unable to assess  Nails  Unable to assess       Diet Order:   Diet Order            Diet NPO time specified Except for: Sips with Meds  Diet effective now              EDUCATION NEEDS:   No education needs have been identified at this time  Skin:  Skin  Assessment: Reviewed RN Assessment  Last BM:  08/12/18  Height:   Ht Readings from Last 1 Encounters:  08/12/18 5\' 9"  (1.753 m)    Weight:   Wt Readings from Last 1 Encounters:  08/12/18 65.5 kg    Ideal Body Weight:  65.9 kg  BMI:  Body mass index is 21.31 kg/m.  Estimated Nutritional Needs:   Kcal:  2000-2200  Protein:  100-115 grams  Fluid:  > 2 L    Alcide Memoli A. Mayford KnifeWilliams, RD, LDN, CDCES Registered Dietitian II Certified Diabetes Care and Education Specialist Pager: [phone] After hours Pager: [phone]

## 2018-08-13 NOTE — Interval H&P Note (Signed)
History and Physical Interval Note:  08/13/2018 2:24 PM  Mallory Stewart  has presented today for surgery, with the diagnosis of pancreatic inflammation, ? mass.  dilated bile duct.  The various methods of treatment have been discussed with the patient and family. After consideration of risks, benefits and other options for treatment, the patient has consented to  Procedure(s): ESOPHAGOGASTRODUODENOSCOPY (EGD) WITH PROPOFOL (N/A) UPPER ESOPHAGEAL ENDOSCOPIC ULTRASOUND (EUS) (N/A) as a surgical intervention.  The patient's history has been reviewed, patient examined, no change in status, stable for surgery.  I have reviewed the patient's chart and labs.  Questions were answered to the patient's satisfaction.     The risks of EUS including bleeding, infection, aspiration pneumonia and intestinal perforation were discussed as was the possibility it may not give a definitive diagnosis.  If a biopsy of the pancreas is done as part of the EUS, there is an additional risk of pancreatitis at the rate of about 1%.  It was explained that procedure related pancreatitis is typically mild, although can be severe and even life threatening, which is why we do not perform random pancreatic biopsies and only biopsy a lesion we feel is concerning enough to warrant the risk.   The risks and benefits of endoscopic evaluation were discussed with the patient; these include but are not limited to the risk of perforation, infection, bleeding, missed lesions, lack of diagnosis, severe illness requiring hospitalization, as well as anesthesia and sedation related illnesses.  The patient is agreeable to proceed.    [employer]

## 2018-08-13 NOTE — Transfer of Care (Signed)
Immediate Anesthesia Transfer of Care Note  Patient: ASANTE VINOKUR  Procedure(s) Performed: ESOPHAGOGASTRODUODENOSCOPY (EGD) WITH PROPOFOL (N/A ) UPPER ESOPHAGEAL ENDOSCOPIC ULTRASOUND (EUS) (N/A )  Patient Location: PACU  Anesthesia Type:General  Level of Consciousness: awake, alert  and patient cooperative  Airway & Oxygen Therapy: Patient Spontanous Breathing  Post-op Assessment: Report given to RN and Post -op Vital signs reviewed and stable  Post vital signs: Reviewed and stable  Last Vitals:  Vitals Value Taken Time  BP 154/96 08/13/2018  4:20 PM  Temp    Pulse 90 08/13/2018  4:21 PM  Resp 13 08/13/2018  4:21 PM  SpO2 100 % 08/13/2018  4:21 PM  Vitals shown include unvalidated device data.  Last Pain:  Vitals:   08/13/18 1310  TempSrc: Oral  PainSc: 0-No pain      Patients Stated Pain Goal: 2 (08/13/18 0900)  Complications: No apparent anesthesia complications

## 2018-08-13 NOTE — Progress Notes (Addendum)
Daily Rounding Note  08/13/2018, 9:23 AM  LOS: 1 day   SUBJECTIVE:   Chief complaint:     Feels ok  OBJECTIVE:         Vital signs in last 24 hours:    Temp:  [97.6 F (36.4 C)-98.2 F (36.8 C)] 97.8 F (36.6 C) (04/08 0559) Pulse Rate:  [57-91] 57 (04/08 0559) Resp:  [14-17] 17 (04/08 0559) BP: (106-139)/(63-95) 128/76 (04/08 0559) SpO2:  [96 %-100 %] 99 % (04/08 0559) Weight:  [65.5 kg] 65.5 kg (04/07 1507) Last BM Date: 08/12/18 Filed Weights   08/12/18 1507  Weight: 65.5 kg   General: looks well, comfortable, calm.  No SOB  Did not reexamine pt other than to speak with her and visually assess      Intake/Output from previous day: 04/07 0701 - 04/08 0700 In: 7.1 [I.V.:7.1] Out: -   Intake/Output this shift: No intake/output data recorded.  Lab Results: Recent Labs    08/12/18 1536 08/13/18 0430  WBC 7.5 5.4  HGB 12.2 9.7*  HCT 36.3 30.1*  PLT 182 179   BMET Recent Labs    08/12/18 1536 08/13/18 0430  NA 141 140  K 3.6 3.0*  CL 102 105  CO2 24 25  GLUCOSE 120* 56*  BUN 6 6  CREATININE 0.66 0.56  CALCIUM 9.8 8.7*   LFT Recent Labs    08/12/18 1536 08/13/18 0430  PROT 7.1 5.8*  ALBUMIN 3.4* 2.7*  AST 96* 62*  ALT 125* 86*  ALKPHOS 486* 406*  BILITOT 1.1 0.8   PT/INR No results for input(s): LABPROT, INR in the last 72 hours. Hepatitis Panel No results for input(s): HEPBSAG, HCVAB, HEPAIGM, HEPBIGM in the last 72 hours.  Studies/Results: Ct Abdomen Pelvis W Contrast  Result Date: 08/12/2018 CLINICAL DATA:  Abdominal pain. EXAM: CT ABDOMEN AND PELVIS WITH CONTRAST TECHNIQUE: Multidetector CT imaging of the abdomen and pelvis was performed using the standard protocol following bolus administration of intravenous contrast. CONTRAST:  100mL OMNIPAQUE IOHEXOL 300 MG/ML  SOLN COMPARISON:  07/21/2018 FINDINGS: Lower chest: Emphysema. Hepatobiliary: No suspicious focal abnormality  within the liver parenchyma. Mild intrahepatic biliary duct dilatation noted. Gallbladder surgically absent. Extrahepatic common duct dilated up to 14 mm diameter. Common bile duct tapers abruptly in the head of the pancreas, near the ampulla. Pancreas: Pancreatic tail markedly atrophic with 16 mm cystic focus identified on 28/3. Head of pancreas appears expanded and ill-defined with some associated edema. Numerous tiny calcifications in the pancreatic parenchyma are compatible with chronic pancreatitis. Spleen: No splenomegaly. No focal mass lesion. Adrenals/Urinary Tract: No adrenal nodule or mass. Kidneys unremarkable. No evidence for hydroureter. Bladder is decompressed. Stomach/Bowel: Stomach is unremarkable. No gastric wall thickening. No evidence of outlet obstruction. Mild wall thickening noted in the gastric antrum. Duodenal bulb shows substantial irregular wall thickening and edema as does the descending and proximal transverse segments of the duodenum. Jejunal loops unremarkable. The terminal ileum is normal. The appendix is normal. Diverticuli are seen scattered along the entire length of the colon without CT findings of diverticulitis. Vascular/Lymphatic: There is abdominal aortic atherosclerosis without aneurysm. Small lymph nodes are seen in the region of the pancreatic head. 12 mm short axis hepato duodenal ligament lymph node is associated. Reproductive: The uterus is unremarkable.  There is no adnexal mass. Other: No intraperitoneal free fluid. Musculoskeletal: No worrisome lytic or sclerotic osseous abnormality. IMPRESSION: 1. Abnormal wall thickening in the distal stomach and descending/proximal  transverse duodenum. This finding is associated with edema and ill definition involving the head of pancreas on a background of features suggesting chronic pancreatitis. Intra and extrahepatic biliary duct dilatation noted with abrupt tapering of the common bile duct in the head of the pancreas near the  ampulla. There are some scattered peripancreatic and hepato duodenal ligament lymph nodes. Imaging features may be related to pancreatitis resulting in narrowing of the common bile duct and biliary dilatation. However, neoplasm in the head of the pancreas or inflammatory/peptic ulcer disease in the distal stomach/duodenum are also considerations. Close follow-up recommended. 2. Atrophy in the tail of pancreas with 16 mm low-density, probably cystic lesion associated in the pancreatic tail region, potentially pseudocyst. Close follow-up recommended. Electronically Signed   By: Kennith Center M.D.   On: 08/12/2018 19:07    ASSESMENT:   *   Pancreatic edema (underlying mass?) with associated distal stomach and duodenal involvement (?causing GOO?).  Peripancreatic and hepatoduodenal adenopathy causing narrowing and subsequent dilation of CBD.  Potential pseudocyst at pancreatic tail.  Chronic pancreatitis, likely alcoholic in origin.  History of previous alcoholic pancreatitis..    *   30 # weight loss associated with above process.  *  Hypokalemia.    *   Plains anemia.  Hgb 12.2 >> 9.7.    *    Laparoscopic cholecystectomy 2018.  *    Alcoholism.  Pt claims no ETOH for 1 month.    *   IDDM.  DKA admission 07/2018   PLAN   *  EGD/EUS with DR Mansouraty.  Timing TBD.  Hopefully this PM, so keep NPO.  Also discussed possibility of neding ERCP/stent with pt but did not schedule this or put ERCP on consent form.      Ordered 20 meq of po potassium.      Jennye Moccasin  08/13/2018, 9:23 AM Phone [phone]

## 2018-08-14 ENCOUNTER — Encounter (HOSPITAL_COMMUNITY): Payer: Self-pay | Admitting: Gastroenterology

## 2018-08-14 ENCOUNTER — Inpatient Hospital Stay (HOSPITAL_COMMUNITY): Payer: 59

## 2018-08-14 DIAGNOSIS — I899 Noninfective disorder of lymphatic vessels and lymph nodes, unspecified: Secondary | ICD-10-CM

## 2018-08-14 DIAGNOSIS — R198 Other specified symptoms and signs involving the digestive system and abdomen: Secondary | ICD-10-CM

## 2018-08-14 DIAGNOSIS — K6389 Other specified diseases of intestine: Secondary | ICD-10-CM

## 2018-08-14 DIAGNOSIS — I361 Nonrheumatic tricuspid (valve) insufficiency: Secondary | ICD-10-CM

## 2018-08-14 DIAGNOSIS — I371 Nonrheumatic pulmonary valve insufficiency: Secondary | ICD-10-CM

## 2018-08-14 DIAGNOSIS — K838 Other specified diseases of biliary tract: Secondary | ICD-10-CM

## 2018-08-14 LAB — GLUCOSE, CAPILLARY
Glucose-Capillary: 132 mg/dL — ABNORMAL HIGH (ref 70–99)
Glucose-Capillary: 135 mg/dL — ABNORMAL HIGH (ref 70–99)
Glucose-Capillary: 165 mg/dL — ABNORMAL HIGH (ref 70–99)
Glucose-Capillary: 178 mg/dL — ABNORMAL HIGH (ref 70–99)
Glucose-Capillary: 239 mg/dL — ABNORMAL HIGH (ref 70–99)
Glucose-Capillary: 241 mg/dL — ABNORMAL HIGH (ref 70–99)

## 2018-08-14 LAB — COMPREHENSIVE METABOLIC PANEL
ALT: 57 U/L — ABNORMAL HIGH (ref 0–44)
AST: 33 U/L (ref 15–41)
Albumin: 2.5 g/dL — ABNORMAL LOW (ref 3.5–5.0)
Alkaline Phosphatase: 327 U/L — ABNORMAL HIGH (ref 38–126)
Anion gap: 10 (ref 5–15)
BUN: <5 mg/dL — ABNORMAL LOW (ref 6–20)
CO2: 22 mmol/L (ref 22–32)
Calcium: 8.4 mg/dL — ABNORMAL LOW (ref 8.9–10.3)
Chloride: 106 mmol/L (ref 98–111)
Creatinine, Ser: 0.56 mg/dL (ref 0.44–1.00)
GFR calc Af Amer: >60 mL/min (ref >60)
GFR calc non Af Amer: >60 mL/min (ref >60)
Glucose, Bld: 189 mg/dL — ABNORMAL HIGH (ref 70–99)
Potassium: 4.1 mmol/L (ref 3.5–5.1)
Sodium: 138 mmol/L (ref 135–145)
Total Bilirubin: 0.4 mg/dL (ref 0.3–1.2)
Total Protein: 5.2 g/dL — ABNORMAL LOW (ref 6.5–8.1)

## 2018-08-14 LAB — CBC
HCT: 28.6 % — ABNORMAL LOW (ref 36.0–46.0)
Hemoglobin: 9.2 g/dL — ABNORMAL LOW (ref 12.0–15.0)
MCH: 29.5 pg (ref 26.0–34.0)
MCHC: 32.2 g/dL (ref 30.0–36.0)
MCV: 91.7 fL (ref 80.0–100.0)
Platelets: 146 10*3/uL — ABNORMAL LOW (ref 150–400)
RBC: 3.12 MIL/uL — ABNORMAL LOW (ref 3.87–5.11)
RDW: 11.8 % (ref 11.5–15.5)
WBC: 4 10*3/uL (ref 4.0–10.5)
nRBC: 0 % (ref 0.0–0.2)

## 2018-08-14 LAB — ECHOCARDIOGRAM COMPLETE
Height: 69 in
Weight: 2309 oz

## 2018-08-14 LAB — MAGNESIUM: Magnesium: 1.3 mg/dL — ABNORMAL LOW (ref 1.7–2.4)

## 2018-08-14 MED ORDER — ENSURE ENLIVE PO LIQD
237.0000 mL | Freq: Three times a day (TID) | ORAL | Status: DC
  Administered 2018-08-14: 237 mL via ORAL

## 2018-08-14 MED ORDER — INSULIN GLARGINE 100 UNIT/ML ~~LOC~~ SOLN
10.0000 [IU] | Freq: Every day | SUBCUTANEOUS | Status: DC
  Administered 2018-08-14 – 2018-08-15 (×2): 10 [IU] via SUBCUTANEOUS
  Filled 2018-08-14 (×3): qty 0.1

## 2018-08-14 MED ORDER — TRAMADOL HCL 50 MG PO TABS
50.0000 mg | ORAL_TABLET | Freq: Two times a day (BID) | ORAL | Status: DC | PRN
  Administered 2018-08-14: 50 mg via ORAL
  Filled 2018-08-14: qty 1

## 2018-08-14 MED ORDER — MAGNESIUM SULFATE 4 GM/100ML IV SOLN
4.0000 g | Freq: Once | INTRAVENOUS | Status: AC
  Administered 2018-08-14: 4 g via INTRAVENOUS
  Filled 2018-08-14: qty 100

## 2018-08-14 MED ORDER — LISINOPRIL 40 MG PO TABS
40.0000 mg | ORAL_TABLET | Freq: Every day | ORAL | Status: DC
  Administered 2018-08-14 – 2018-08-15 (×2): 40 mg via ORAL
  Filled 2018-08-14 (×2): qty 1

## 2018-08-14 MED ORDER — MENTHOL 3 MG MT LOZG
1.0000 | LOZENGE | OROMUCOSAL | Status: DC | PRN
  Filled 2018-08-14: qty 9

## 2018-08-14 MED ORDER — GLUCERNA SHAKE PO LIQD
237.0000 mL | Freq: Three times a day (TID) | ORAL | Status: DC
  Administered 2018-08-14 (×2): 237 mL via ORAL

## 2018-08-14 MED ORDER — ADULT MULTIVITAMIN W/MINERALS CH
1.0000 | ORAL_TABLET | Freq: Every day | ORAL | Status: DC
  Administered 2018-08-14 – 2018-08-15 (×2): 1 via ORAL
  Filled 2018-08-14 (×2): qty 1

## 2018-08-14 MED ORDER — PANTOPRAZOLE SODIUM 40 MG IV SOLR
40.0000 mg | Freq: Two times a day (BID) | INTRAVENOUS | Status: DC
  Administered 2018-08-14 – 2018-08-15 (×3): 40 mg via INTRAVENOUS
  Filled 2018-08-14 (×3): qty 40

## 2018-08-14 NOTE — Progress Notes (Signed)
Inpatient Diabetes Program Recommendations  AACE/ADA: New Consensus Statement on Inpatient Glycemic Control (2015)  Target Ranges:  Prepandial:   less than 140 mg/dL      Peak postprandial:   less than 180 mg/dL (1-2 hours)      Critically ill patients:  140 - 180 mg/dL   Lab Results  Component Value Date   GLUCAP 178 (H) 08/14/2018   HGBA1C 13.3 (H) 07/23/2018    Review of Glycemic Control  Diabetes history: type 2 Outpatient Diabetes medications: Tresiba 40 units every am Current orders for Inpatient glycemic control:   Inpatient Diabetes Program Recommendations:    Spoke with patient on the phone. Patient states that she has been taking [place] for about 2 weeks per PCP. She has been regulating the dosage per PCP orders to increase 4 units every 2 days to keep blood sugars 80-120 mg/dl. States that her dose is up to 40 units daily.   Discussed the importance of checking blood sugars at least 3 times per day and recording the results. Then take the results to physician as follow up. If blood sugars run greater tham 250 mg/dl on a regular basis, check with PCP. She will call PCP for follow up appointment after discharge.  Will attach diabetes sick day management with discharge papers. Treatment of hypoglycemia exit notes attached. Will continue to monitor blood sugars while in the hospital.  Smith Mince RN BSN CDE Diabetes Coordinator Pager: [phone]  8am-5pm

## 2018-08-14 NOTE — Progress Notes (Signed)
PROGRESS NOTE    Mallory Stewart  VFI:[id number] DOB: 1961-03-28 DOA: 08/12/2018 PCP: Mallory Sanders, MD   Brief Narrative:  Mallory Stewart is Mallory Stewart 58 y.o. female with medical history significant of diabetes type 1 with recent DKA, hypertension, chronic pancreatitis Who was admitted on March 16 with DKA and discharged home in good health.  Patient returns today due to intractable nausea vomiting and abdominal pain.  Symptoms have been going on for 3 days now.  She has been taking her nausea medicine but not relieved.  She has pain at 8 out of 10 in the epigastric region radiating to her back.  She denied taking NSAIDs or alcohol.  Denied any new medications.  Denied any sick contacts and did not eat outside the home.  Patient was seen in the ER evaluated and found to have gastric outlet obstruction due to pancreatic mass.  She is therefore being admitted for work-up..  ED Course: Vitals and lab work all appear to be within normal.  Glucose is 120.  ALT is 126 AST 96 lipase 116 and alkaline phosphatase of 486 with albumin of 3.4.  Urinalysis essentially negative.  CT abdomen pelvis showed Distal stomach thickening with proximal transverse duodenum.  Finding associated with the head of pancreas suspected mass or inflammation.  Gastric outlet obstruction as Mallory Stewart result of that.  GI was consulted and we are consulted for admission.  Assessment & Plan:   Principal Problem:   Gastric outlet obstruction Active Problems:   HYPERCHOLESTEROLEMIA   Essential hypertension, benign   Fatty liver   Chronic pain   EKG abnormalities   1.  Gastric outlet obstruction: Imaging notable for abnormal wall thickening in the distal stomach and descending/proximal transverse duodenum.  Associated with edema and ill definition involving the head of the pancreas on Mallory Stewart background of features suggesting chronic pancreatitis.  Concern for GOO related to chronic pancreatitis (see image report and GI c/s).   Full liquid diet  now S/p EGD/EUS - notable for grade C esophagitis, gastritis (biopsied), duodenal deformity (biopsied).   EUS with evidence of chronic pancreatitis.  See report. Recommending IV PPI.  Follow bx results. Calorie count.    # Biliary Obstruction: related to above, appreciate GI input  2.  Diabetes: A1c 3/18 13.3.   Hypoglycemia 4/8.  She was asymptomatic with these episodes. Home insulin 40 units tresiba daily BG this AM appropriate Will decrease to 10 units lantus here daily (she needs basal insulin given hx DKA) Continue Q4 SSI  Diabetic education (she was only checking BG's once Mallory Stewart day at home)  3.  Essential hypertension: Blood pressure is controlled.  Resume metoprolol at 25 mg BID (lower dose with holding parameters).  Resume lisinopril.   4. chronic pain: Continue home regimen and monitor.  5.  Abnormal EKG:  Troponin negative.  No CP or SOB.  EKG with diffuse T wave inversions in II, III, aVF and V3, V4, V5, V6.  Will need outpatient cards follow up. Echo with normal EF (see report)   # Elevated LFTs: likely related to above.  Follow acute hepatitis panel.  Improved.   DVT prophylaxis: lovenox Code Status: full  Family Communication: declined me calling husband, encouraged to let me know if she changes her mind Disposition Plan: pending GI evaluation and sign off, stable blood sugars on insulin   Consultants:   GI   Procedures:  EGD Impression: - No gross lesions in proximal/middle esophagus. LA Grade C esophagitis in distal esophagus.  Non-obstructing Schatzki ring at [name]. - Gastritis. Biopsied for HP. - Duodenal deformity with associated Mucosal changes in the duodenum. Biopsied to rule out adenomatous tissue or mass/lesion infiltrative. There is associated stenosis in this region that only allowed an adult endoscope to pass through the region, no larger scopes would be able to traverse. - Normal appearing ampulla but only visualized with forward viewing  endoscope. - Distal D2 normal appearing. EUS Impression: - Pancreatic parenchymal abnormalities consisting of atrophy, hyperechoic foci, lobularity with honeycombing and hyperechoic strands were noted in the entire pancreas. These changes are consistent based on [place] criteria for diagnosis of Chronic Pancreatitis. - The pancreatic duct had Mallory Stewart normal endosonographic appearance in the pancreatic head, genu of the pancreas, body of the pancreas and tail of the pancreas. - Mallory Stewart cystic lesion was seen in the pancreatic body and pancreatic tail. Tissue has not been obtained. However, the endosonographic appearance is suggestive of benign inflammatory change and possible small pseudocyst. - No pancreatic masses were noted, but decreased sensitivity for findings masses/lesions as Mallory Stewart result of severe chronic pancreatitis changes throughout. - There was dilation in the common bile duct and in the common hepatic duct. - There was Mallory Stewart suggestion of Mallory Stewart distal stenosis in the CBD as it enters into the HOP. - No malignant-appearing lymph nodes were visualized in the celiac region (level 20), peripancreatic region and porta hepatis region. - The patient will be observed post-procedure, until all discharge criteria are met. - Return patient to hospital ward for ongoing care. - Full liquid diet today. - Initiate IV PPI BID and transition if tolerating PO diet in the next couple of days to PO PPI BID. - Based on her overall ability and calorie counts in the next 48-72 hours, will dictate need for other supplemental feeding (I would like her to have 3-4 Ensures per day) to optimize calories and passage through the congested/swollen Duodenum. - Await path results. - Observe patient's clinical course. - If fails calorie counts then would consider an UGI Series. - If LFTs continue to rise then will need to consider role of percutaneous drainage if any evidence of cholangitis. Could consider role of ERCP once  again,but with inability to traverse the region today with duodenoscope, repeat attempt would likely be unsuccesful but may need to be considered. - If biopsies are unremarkable for malignant/infiltrative process, then my hypothesis is that this is Haydan Mansouri bout of pancreatitis induced by her chronic idiopathic pancreatitis (previous notation of some alcohol use though patient denies overt long-term high intake). The patient could benefit from post-duodenal bulb feedings via Cortrak if she fails. This is not typical Groove Pancreatitis either, but this has been associated with Chronic Pancreatitis at times. May benefit from initiation of Pancreas Enzyme Replacement Therapy (Chronic Pancreatitis dosing can be weight based in setting of significant EPI but can be helpful in Romaine Neville pain perspective for some individuals up to 72000 units of Lipase with each meal). Let us see how she does in the interim and will discuss the utility of this during hospitalization and with follow up with Dr. Carlean Purl at some point in future. - The findings and recommendations were discussed with the patient. - The findings and recommendations were discussed with the referring physician.  Echo IMPRESSIONS    1. The left ventricle has normal systolic function, with an ejection fraction of 55-60%. The cavity size was normal. Left ventricular diastolic parameters were normal.  2. The right ventricle has normal systolic function. The cavity was  normal. There is no increase in right ventricular wall thickness.  3. Left atrial size was moderately dilated.  4. Mild thickening of the mitral valve leaflet. There is mild mitral annular calcification present.  5. The tricuspid valve is grossly normal.  6. The aortic valve is tricuspid. Mild thickening of the aortic valve. Mild calcification of the aortic valve. Antimicrobials: Anti-infectives (From admission, onward)   None     Subjective: Feels better.  Objective: Vitals:    08/13/18 2357 08/14/18 0413 08/14/18 0824 08/14/18 1316  BP: (!) 125/96 128/78 140/85 (!) 140/97  Pulse: 69 65 72 64  Resp: _0 Temp: 98.6 F (37 C) 98.3 F (36.8 C)  98.2 F (36.8 C)  TempSrc: Oral Oral  Oral  SpO2: 98% 98%  100%  Weight:      Height:        Intake/Output Summary (Last 24 hours) at 08/14/2018 1553 Last data filed at 08/14/2018 1500 Gross per 24 hour  Intake 2233.4 ml  Output -  Net 2233.4 ml   Filed Weights   08/12/18 1507  Weight: 65.5 kg    Examination:  General: No acute distress. Cardiovascular: Heart sounds show Erice Ahles regular rate, and rhythm.  Lungs: Clear to auscultation bilaterally  Abdomen: Soft, nontender, nondistended  Neurological: Alert and oriented 3. Moves all extremities 4 with equal strength. Cranial nerves II through XII grossly intact. Skin: Warm and dry. No rashes or lesions. Extremities: No clubbing or cyanosis. No edema. Psychiatric: Mood and affect are normal. Insight and judgment are stable.   Data Reviewed: I have personally reviewed following labs and imaging studies  CBC: Recent Labs  Lab 08/12/18 1536 08/13/18 0430 08/14/18 0211  WBC 7.5 5.4 4.0  HGB 12.2 9.7* 9.2*  HCT 36.3 30.1* 28.6*  MCV 90.5 91.8 91.7  PLT 182 179 923*   Basic Metabolic Panel: Recent Labs  Lab 08/12/18 1536 08/13/18 0430 08/14/18 0211  NA 141 140 138  K 3.6 3.0* 4.1  CL 102 105 106  CO2 _1 GLUCOSE 120* 56* 189*  BUN 6 6 <5*  CREATININE 0.66 0.56 0.56  CALCIUM 9.8 8.7* 8.4*  MG  --   --  1.3*   GFR: Estimated Creatinine Clearance: 82.2 mL/min (by C-G formula based on SCr of 0.56 mg/dL). Liver Function Tests: Recent Labs  Lab 08/12/18 1536 08/13/18 0430 08/14/18 0211  AST 96* 62* 33  ALT 125* 86* 57*  ALKPHOS 486* 406* 327*  BILITOT 1.1 0.8 0.4  PROT 7.1 5.8* 5.2*  ALBUMIN 3.4* 2.7* 2.5*   Recent Labs  Lab 08/12/18 1536  LIPASE 116*   No results for input(s): AMMONIA in the last 168 hours. Coagulation  Profile: No results for input(s): INR, PROTIME in the last 168 hours. Cardiac Enzymes: Recent Labs  Lab 08/13/18 1046  TROPONINI <0.03   BNP (last 3 results) No results for input(s): PROBNP in the last 8760 hours. HbA1C: No results for input(s): HGBA1C in the last 72 hours. CBG: Recent Labs  Lab 08/13/18 2038 08/13/18 2358 08/14/18 0414 08/14/18 0758 08/14/18 1124  GLUCAP 140* 239* 165* 132* 178*   Lipid Profile: No results for input(s): CHOL, HDL, LDLCALC, TRIG, CHOLHDL, LDLDIRECT in the last 72 hours. Thyroid Function Tests: No results for input(s): TSH, T4TOTAL, FREET4, T3FREE, THYROIDAB in the last 72 hours. Anemia Panel: No results for input(s): VITAMINB12, FOLATE, FERRITIN, TIBC, IRON, RETICCTPCT in the last 72 hours. Sepsis Labs: No results for input(s): PROCALCITON, LATICACIDVEN  in the last 168 hours.  No results found for this or any previous visit (from the past 240 hour(s)).       Radiology Studies: Ct Abdomen Pelvis W Contrast  Result Date: 08/12/2018 CLINICAL DATA:  Abdominal pain. EXAM: CT ABDOMEN AND PELVIS WITH CONTRAST TECHNIQUE: Multidetector CT imaging of the abdomen and pelvis was performed using the standard protocol following bolus administration of intravenous contrast. CONTRAST:  15m OMNIPAQUE IOHEXOL 300 MG/ML  SOLN COMPARISON:  07/21/2018 FINDINGS: Lower chest: Emphysema. Hepatobiliary: No suspicious focal abnormality within the liver parenchyma. Mild intrahepatic biliary duct dilatation noted. Gallbladder surgically absent. Extrahepatic common duct dilated up to 14 mm diameter. Common bile duct tapers abruptly in the head of the pancreas, near the ampulla. Pancreas: Pancreatic tail markedly atrophic with 16 mm cystic focus identified on 28/3. Head of pancreas appears expanded and ill-defined with some associated edema. Numerous tiny calcifications in the pancreatic parenchyma are compatible with chronic pancreatitis. Spleen: No splenomegaly. No  focal mass lesion. Adrenals/Urinary Tract: No adrenal nodule or mass. Kidneys unremarkable. No evidence for hydroureter. Bladder is decompressed. Stomach/Bowel: Stomach is unremarkable. No gastric wall thickening. No evidence of outlet obstruction. Mild wall thickening noted in the gastric antrum. Duodenal bulb shows substantial irregular wall thickening and edema as does the descending and proximal transverse segments of the duodenum. Jejunal loops unremarkable. The terminal ileum is normal. The appendix is normal. Diverticuli are seen scattered along the entire length of the colon without CT findings of diverticulitis. Vascular/Lymphatic: There is abdominal aortic atherosclerosis without aneurysm. Small lymph nodes are seen in the region of the pancreatic head. 12 mm short axis hepato duodenal ligament lymph node is associated. Reproductive: The uterus is unremarkable.  There is no adnexal mass. Other: No intraperitoneal free fluid. Musculoskeletal: No worrisome lytic or sclerotic osseous abnormality. IMPRESSION: 1. Abnormal wall thickening in the distal stomach and descending/proximal transverse duodenum. This finding is associated with edema and ill definition involving the head of pancreas on Norita Meigs background of features suggesting chronic pancreatitis. Intra and extrahepatic biliary duct dilatation noted with abrupt tapering of the common bile duct in the head of the pancreas near the ampulla. There are some scattered peripancreatic and hepato duodenal ligament lymph nodes. Imaging features may be related to pancreatitis resulting in narrowing of the common bile duct and biliary dilatation. However, neoplasm in the head of the pancreas or inflammatory/peptic ulcer disease in the distal stomach/duodenum are also considerations. Close follow-up recommended. 2. Atrophy in the tail of pancreas with 16 mm low-density, probably cystic lesion associated in the pancreatic tail region, potentially pseudocyst. Close  follow-up recommended. Electronically Signed   By: EMisty StanleyM.D.   On: 08/12/2018 19:07        Scheduled Meds: . enoxaparin (LOVENOX) injection  40 mg Subcutaneous Q24H  . feeding supplement (GLUCERNA SHAKE)  237 mL Oral TID BM  . insulin aspart  0-9 Units Subcutaneous Q4H  . metoprolol tartrate  25 mg Oral BID  . multivitamin with minerals  1 tablet Oral Daily  . pantoprazole (PROTONIX) IV  40 mg Intravenous Q12H   Continuous Infusions:    LOS: 2 days    Time spent: over 30 min    CFayrene Helper MD [facility] Pager AMION  If 7PM-7AM, please contact night-coverage [email or web address] Password [email or web address]4/01/2019, 3:53 PM

## 2018-08-14 NOTE — Progress Notes (Signed)
  Echocardiogram 2D Echocardiogram has been performed.  Conroy Goracke G Heather Mckendree 08/14/2018, 10:36 AM

## 2018-08-14 NOTE — Progress Notes (Signed)
Nutrition Follow-up  DOCUMENTATION CODES:   Not applicable  INTERVENTION:   -Continue Glucerna Shake po TID, each supplement provides 220 kcal and 10 grams of protein -MVI with minerals daily  NUTRITION DIAGNOSIS:   Inadequate oral intake related to altered GI function as evidenced by NPO status.  Progressing; advanced to a full liquid diet  GOAL:   Patient will meet greater than or equal to 90% of their needs  Progressing  MONITOR:   Diet advancement, Labs, Weight trends, Skin, I & O's  REASON FOR ASSESSMENT:   Consult Assessment of nutrition requirement/status  ASSESSMENT:   Mallory Stewart is a 58 y.o. female with medical history significant of diabetes type 1 with recent DKA, hypertension, chronic pancreatitis Who was admitted on March 16 with DKA and discharged home in good health.  Patient returns today due to intractable nausea vomiting and abdominal pain.  Symptoms have been going on for 3 days now.  She has been taking her nausea medicine but not relieved.  She has pain at 8 out of 10 in the epigastric region radiating to her back.  She denied taking NSAIDs or alcohol.  Denied any new medications.  Denied any sick contacts and did not eat outside the home.  Patient was seen in the ER evaluated and found to have gastric outlet obstruction due to pancreatic mass.  She is therefore being admitted for work-up.  4/8- s/p Procedure(s): EGD and EUS, which revealed no gross lesions in proximal/middle esophagus; LA Grade C esophagitis in distal esophagus; Non-obstructing Schatzki ring at [employer]; gastritis (biopsised for h-pylori); Duodenal deformity with associated Mucosal (biopsied to rule out adenomatous tissue or mass/lesion infiltrative);  EUS consistent with chronic pancreatitis; cystic lesion was seen in the pancreatic body and pancreatic tail- suggestive of benign inflammatory change and possible small pseudocyst; no pancreatic masses detected; dilation in the  common bile duct and in the common hepatic duct, suggestion of a distal stenosis in the CBD as it enters into the HOP; advanced to full liquid diet  Reviewed I/O's: +2.8 L x 24 hours                                                                           RD consulted for assessment of needs. Per GI notes, recommending pt receive 3-4 supplements per days and observe oral intake closely to determine need to potential supplemental enteral feeding.   Pt tolerating full liquid diet well; noted meal completion 100%. Pt accepted first dose of Glucerna.  Labs reviewed: CBGS: 132-178 (inpatient orders for glycemic control are 0-9 units insulin aspart every 4 hours).   Diet Order:   Diet Order            Diet full liquid Room service appropriate? Yes; Fluid consistency: Thin  Diet effective now              EDUCATION NEEDS:   No education needs have been identified at this time  Skin:  Skin Assessment: Reviewed RN Assessment  Last BM:  08/11/18  Height:   Ht Readings from Last 1 Encounters:  08/12/18 5\' 9"  (1.753 m)    Weight:   Wt Readings from Last 1 Encounters:  08/12/18 65.5 kg  Ideal Body Weight:  65.9 kg  BMI:  Body mass index is 21.31 kg/m.  Estimated Nutritional Needs:   Kcal:  2000-2200  Protein:  100-115 grams  Fluid:  > 2 L    Gustav Knueppel A. Mayford KnifeWilliams, RD, LDN, CDCES Registered Dietitian II Certified Diabetes Care and Education Specialist Pager: [phone] After hours Pager: [phone]

## 2018-08-14 NOTE — Progress Notes (Signed)
Gastroenterology Inpatient Follow-up Note   PATIENT IDENTIFICATION  Mallory Stewart is a 58 y.o. female with a pmh significant for diabetes, hypertension, degenerative joint disease, previous pancreatitis (unclear etiology idiopathic versus alcohol), chronic pancreatitis, abnormal CT imaging. Hospital Day: 3  SUBJECTIVE  Patient seen and evaluated earlier this afternoon. Patient tolerating liquid diet without any issues and feels better than she has in days. Biopsies pending. Patient wants know she can tolerate an advancement in her diet if she will be able to go home soon. Denies any fevers or chills.  Abdominal discomfort is improving. The patient denies any issues with jaundice, scleral icterus, pruritus, darkened/amber urine, clay-colored stools, LE edema, hematemesis, coffee-ground emesis, abdominal distention, confusion, new generalized pruritus.   OBJECTIVE  Scheduled Inpatient Medications:   enoxaparin (LOVENOX) injection  40 mg Subcutaneous Q24H   feeding supplement (GLUCERNA SHAKE)  237 mL Oral TID BM   insulin aspart  0-9 Units Subcutaneous Q4H   insulin glargine  10 Units Subcutaneous Daily   metoprolol tartrate  25 mg Oral BID   multivitamin with minerals  1 tablet Oral Daily   pantoprazole (PROTONIX) IV  40 mg Intravenous Q12H   Continuous Inpatient Infusions:  PRN Inpatient Medications: cyclobenzaprine, menthol-cetylpyridinium, ondansetron **OR** ondansetron (ZOFRAN) IV   Physical Examination  Temp:  [97.9 F (36.6 C)-98.6 F (37 C)] 98.2 F (36.8 C) (04/09 1316) Pulse Rate:  [63-96] 64 (04/09 1316) Resp:  [12-16] 16 (04/09 1316) BP: (125-184)/(78-99) 140/97 (04/09 1316) SpO2:  [98 %-100 %] 100 % (04/09 1316) Temp (24hrs), Avg:98.2 F (36.8 C), Min:97.9 F (36.6 C), Max:98.6 F (37 C)  Weight: 65.5 kg GEN: NAD, appears stated age, doesn't appear chronically ill PSYCH: Cooperative, without pressured speech EYE: Conjunctivae pink, sclerae  anicteric ENT: MMM CV: RR without R/Gs  RESP: Without adventitious sounds present GI: NABS, soft, minimal tenderness to palpation in the right upper quadrant/midepigastrium, without rebound or guarding  MSK/EXT: Trace bilateral pedal edema SKIN: No jaundice NEURO:  Alert & Oriented x 3, no focal deficits   Review of Data   Laboratory Studies   Recent Labs  Lab 08/14/18 0211  NA 138  K 4.1  CL 106  CO2 22  BUN <5*  CREATININE 0.56  GLUCOSE 189*  CALCIUM 8.4*  MG 1.3*   Recent Labs  Lab 08/14/18 0211  AST 33  ALT 57*  ALKPHOS 327*    Recent Labs  Lab 08/12/18 1536 08/13/18 0430 08/14/18 0211  WBC 7.5 5.4 4.0  HGB 12.2 9.7* 9.2*  HCT 36.3 30.1* 28.6*  PLT 182 179 146*   No results for input(s): APTT, INR in the last 168 hours.  Imaging Studies  No new relevant studies to review  GI Procedures and Studies  EUS EGD Impression: - No gross lesions in proximal/middle esophagus. LA Grade C esophagitis in distal esophagus. Non-obstructing Schatzki ring at [employer] [place]. - Gastritis. Biopsied for HP. - Duodenal deformity with associated Mucosal changes in the duodenum. Biopsied to rule out adenomatous tissue or mass/lesion infiltrative. There is associated stenosis in this region that only allowed an adult endoscope to pass through the region, no larger scopes would be able to traverse. - Normal appearing ampulla but only visualized with forward viewing endoscope. - Distal D2 normal appearing. EUS Impression: - Pancreatic parenchymal abnormalities consisting of atrophy, hyperechoic foci, lobularity with honeycombing and hyperechoic strands were noted in the entire pancreas. These changes are consistent based on Rose[place] criteria for diagnosis of Chronic Pancreatitis. - The pancreatic  duct had a normal endosonographic appearance in the pancreatic head, genu of the pancreas, body of the pancreas and tail of the pancreas. - A cystic lesion was seen in the pancreatic  body and pancreatic tail. Tissue has not been obtained. However, the endosonographic appearance is suggestive of benign inflammatory change and possible small pseudocyst. - No pancreatic masses were noted, but decreased sensitivity for findings masses/lesions as a result of severe chronic pancreatitis changes throughout. - There was dilation in the common bile duct and in the common hepatic duct. - There was a suggestion of a distal stenosis in the CBD as it enters into the HOP. - No malignant-appearing lymph nodes were visualized in the celiac region (level 20), peripancreatic region and porta hepatis region.   ASSESSMENT  Mallory Stewart is a 58 y.o. female with a pmh significant for diabetes, hypertension, degenerative joint disease, previous pancreatitis (unclear etiology idiopathic versus alcohol), chronic pancreatitis, abnormal CT imaging.  The patient has definite evidence of chronic pancreatitis with the etiology remaining unclear although underlying alcohol had been presumed to be the etiology of her prior bout of pancreatitis.  It is interesting though that her imaging has dramatically changed over the course of just the last 2 years since her last cross-sectional imaging in 2018.  There obviously is always the concern that in the setting of chronic pancreatitis acute can miss subtle lesions in the pancreas as a result of the calcifications and hyper echoic foci that shadow.  The patient's liver biochemical testing today is improving as well.  Not completely clear what is going on and/or if this is a congestion edema that is causing her issues.  With that being said will maintain her PPI and await biopsy results to ensure that there is no underlying malignant process occurring.  If she is unable to advance her diet she may require post duodenal feeding.  Have asked her to also initiate more frequent protein shakes during the course of the day to try and optimize her weight and calorie intake.  As  discussed previously she may benefit in the future from being on pancreatic enzyme replacement therapy and this can be considered as an outpatient.   PLAN/RECOMMENDATIONS  Attempt advancement of diet to diabetic soft diet Maintenance fluids as necessary Trend LFTs every other day while inpatient Await biopsy results Chronic pancreatitis consideration of pancreatic enzyme replacement therapy can be considered as an outpatient Continue PPI twice daily for now Consider repeat endoscopy if patient improves in approximately 6 to 8 weeks to reevaluate the area Patient will benefit from repeat imaging in approximately 8 weeks to ensure that there is no longer concern for a pancreatic lesion May have to consider repeat EUS if there is any persistence in imaging abnormalities but if this remains in the distal head/uncinate region and there remains congestion then it unlikely that we would be able to sample this   Dr. Yancey Flemings takes over the GI service starting on 4/10.  GI will follow-up on 4/10.  Please page/call with questions or concerns.   Corliss Parish, MD [facility] Office # [phone]    LOS: 2 days  Mallory Stewart  08/14/2018, 4:07 PM

## 2018-08-14 NOTE — Op Note (Signed)
[facility] Patient Name: Mallory Stewart Procedure Date : 08/13/2018 MRN: [id number] Attending MD: Justice Britain , MD Date of Birth: [birth year] CSN: [id number] Age: 58 Admit Type: Inpatient Procedure:                Upper EUS Indications:              Common bile duct dilation (etiology unknown) seen                            on CT scan, Suspected mass in pancreas on CT scan,                            Abnormal abdominal/pelvic CT scan, Suspected                            chronic pancreatitis, Exclusion of chronic                            pancreatitis, Exclusion of duodenal stenosis,                            Abnormal CT of the GI tract Providers:                Justice Britain, MD, Glori Bickers, RN, Carlyn Reichert, RN, Charolette Child, Technician, Lavona Mound, CRNA Referring MD:             Gatha Mayer, MD, Dr. Florene Glen ([facility]) Medicines:                General Anesthesia Complications:            No immediate complications. Estimated Blood Loss:     Estimated blood loss was minimal. Procedure:                Pre-Anesthesia Assessment:                           - Prior to the procedure, a History and Physical                            was performed, and patient medications and                            allergies were reviewed. The patient's tolerance of                            previous anesthesia was also reviewed. The risks                            and benefits of the procedure and the sedation  options and risks were discussed with the patient.                            All questions were answered, and informed consent                            was obtained. Prior Anticoagulants: The patient has                            taken no previous anticoagulant or antiplatelet                            agents. ASA Grade Assessment: III - A patient with        severe systemic disease. After reviewing the risks                            and benefits, the patient was deemed in                            satisfactory condition to undergo the procedure.                           After obtaining informed consent, the endoscope was                            passed under direct vision. Throughout the                            procedure, the patient's blood pressure, pulse, and                            oxygen saturations were monitored continuously. The                            [id number]-H190 ([id number]) Olympus gastroscope was                            introduced through the mouth, and advanced to the                            second part of duodenum. The [id number]-Q180V ([id number])                            Olympus Duodensocope was introduced through the                            mouth, and advanced to the duodenal bulb. The                            GF-UTC180 ([id number]) Olympus Linear EUS scope was                            introduced through the mouth, and advanced to the  duodenum for ultrasound examination from the                            stomach and duodenum. The upper EUS was                            accomplished without difficulty. The patient                            tolerated the procedure. Scope In: Scope Out: Findings:      ENDOSCOPIC FINDING: :      No gross lesions were noted in the proximal esophagus and in the mid       esophagus.      LA Grade C (one or more mucosal breaks continuous between tops of 2 or       more mucosal folds, less than 75% circumference) esophagitis with no       bleeding was found in the distal esophagus and the [place]unction.      A non-obstructing Schatzki ring was found at the gastroesophageal       junction.      Scattered mild inflammation characterized by erythema was found in the       gastric body and in the gastric antrum. Biopsies were taken with a cold       forceps  for histology and Helicobacter pylori testing.      A severe deformity was found in the duodenal bulb, and in the D1/D2       sweep. The adult endoscope only passed after significant maneuvering and       pressure to get into the D2 region. Attempts at the [place] to pass       including with wire guided placement were not successful to get into the       D2 region, thus no EUS evaluation in D1/D2 sweep and D2 could be       performed.      Diffuse severe mucosal changes characterized by congestion, friability       (with contact bleeding), granularity, inflammation and altered texture       were found in the duodenal bulb, in D1/D2 sweep and the proximal portion       of D2. Biopsies were taken with a cold forceps for histology to rule out       an infilitrative mass. Has more endoscopic appearance of congestion and       inflammed mucosa in most regions however.      The ampulla was grossly normal appearing though could only be visualized       with the forward viewing EGD scope as the duodenoscope could not       traverse the bulb/D1-D2 sweep.      The distal portion of D2 was normal appearing.      ENDOSONOGRAPHIC FINDING: :      Pancreatic parenchymal abnormalities were noted in the entire pancreas.       These consisted of overall atrophy, hyperechoic foci with shadowing,       lobularity with honeycombing and hyperechoic strands throughout the       entire pancreas.      The pancreatic duct had a normal endosonographic appearance in the       pancreatic head (1.5 mm -> 1.9 mm), genu of the pancreas (1.8  mm), body       of the pancreas (0.9 mm) and tail of the pancreas (thin barely       visualized due to atrophy).      An anechoic lesion suggestive of a cyst was identified in the pancreatic       body and pancreatic tail. It is not in obvious communication with the       pancreatic duct. The lesion measured 11 mm by 5 mm in maximal       cross-sectional diameter. There was a  single compartment without septae.       The outer wall of the lesion was thin. There was no internal debris       within the fluid-filled cavity.      Endosonographic imaging in the entire pancreas showed no mass. However,       as noted previously, visualization of the entire HOP and the Uncinate       Process was not possible as a result of the significant congestion       within the duodenal bulb, I could only enter into the very proximal bulb       to allow evaluation with the duodenoscope and the Linear EUS. As a       result of the calcifications and evidence of Chronic Pancreatitis above,       the sensitivity of finding masses/lesions in the pancreas is decreased.       However, very close examination at this time did not visualize a mass.      There was dilation in the common bile duct (5.5 mm -> 10 mm) and in the       common hepatic duct (16 mm).      There was a suggestion of a stenosis in the very distal common bile       duct. This is in the head of the pancreas region and adjacent to       multiple calcifications but no clearly defined mass.      Endosonographic imaging in the visualized portion of the liver showed no       mass-lesion.      No malignant-appearing lymph nodes were visualized in the celiac region       (level 20), peripancreatic region and porta hepatis region.      The celiac region was visualized. Impression:               EGD Impression:                           - No gross lesions in proximal/middle esophagus. LA                            Grade C esophagitis in distal esophagus.                            Non-obstructing Schatzki ring at [name].                           - Gastritis. Biopsied for HP.                           - Duodenal deformity with associated Mucosal  changes in the duodenum. Biopsied to rule out                            adenomatous tissue or mass/lesion infiltrative.                             There is associated stenosis in this region that                            only allowed an adult endoscope to pass through the                            region, no larger scopes would be able to traverse.                           - Normal appearing ampulla but only visualized with                            forward viewing endoscope.                           - Distal D2 normal appearing.                           EUS Impression:                           - Pancreatic parenchymal abnormalities consisting                            of atrophy, hyperechoic foci, lobularity with                            honeycombing and hyperechoic strands were noted in                            the entire pancreas. These changes are consistent                            based on Rosemont criteria for diagnosis of Chronic                            Pancreatitis.                           - The pancreatic duct had a normal endosonographic                            appearance in the pancreatic head, genu of the                            pancreas, body of the pancreas and tail of the                            pancreas.                           -  A cystic lesion was seen in the pancreatic body                            and pancreatic tail. Tissue has not been obtained.                            However, the endosonographic appearance is                            suggestive of benign inflammatory change and                            possible small pseudocyst.                           - No pancreatic masses were noted, but decreased                            sensitivity for findings masses/lesions as a result                            of severe chronic pancreatitis changes throughout.                           - There was dilation in the common bile duct and in                            the common hepatic duct.                           - There was a suggestion of a distal stenosis in                             the CBD as it enters into the HOP.                           - No malignant-appearing lymph nodes were                            visualized in the celiac region (level 20),                            peripancreatic region and porta hepatis region. Recommendation:           - The patient will be observed post-procedure,                            until all discharge criteria are met.                           - Return patient to hospital ward for ongoing care.                           - Full liquid diet today.                           -  Initiate IV PPI BID and transition if tolerating                            PO diet in the next couple of days to PO PPI BID.                           - Based on her overall ability and calorie counts                            in the next 48-72 hours, will dictate need for                            other supplemental feeding (I would like her to                            have 3-4 Ensures per day) to optimize calories and                            passage through the congested/swollen Duodenum.                           - Await path results.                           - Observe patient's clinical course.                           - If fails calorie counts then would consider an                            UGI Series.                           - If LFTs continue to rise then will need to                            consider role of percutaneous drainage if any                            evidence of cholangitis. Could consider role of                            ERCP once again,but with inability to traverse the                            region today with duodenoscope, repeat attempt                            would likely be unsuccesful but may need to be                            considered.                           -  If biopsies are unremarkable for                            malignant/infiltrative process, then my hypothesis                             is that this is a bout of pancreatitis induced by                            her chronic idiopathic pancreatitis (previous                            notation of some alcohol use though patient denies                            overt long-term high intake). The patient could                            benefit from post-duodenal bulb feedings via                            Cortrak if she fails. This is not typical [place]                            Pancreatitis either, but this has been associated                            with Chronic Pancreatitis at times. May benefit                            from initiation of Pancreas Enzyme Replacement                            Therapy (Chronic Pancreatitis dosing can be weight                            based in setting of significant EPI but can be                            helpful in a pain perspective for some individuals                            up to 72000 units of Lipase with each meal). Let us                            see how she does in the interim and will discuss                            the utility of this during hospitalization and with                            follow up with Dr. Carlean Purl at some point in future.                           -  The findings and recommendations were discussed                            with the patient.                           - The findings and recommendations were discussed                            with the referring physician. Procedure Code(s):        --- Professional ---                           [phone], Esophagogastroduodenoscopy, flexible,                            transoral; with endoscopic ultrasound examination                            limited to the esophagus, stomach or duodenum, and                            adjacent structures                           43239, Esophagogastroduodenoscopy, flexible,                            transoral; with biopsy, single or  multiple Diagnosis Code(s):        --- Professional ---                           K20.9, Esophagitis, unspecified                           K22.2, Esophageal obstruction                           K29.70, Gastritis, unspecified, without bleeding                           K31.89, Other diseases of stomach and duodenum                           K86.9, Disease of pancreas, unspecified                           K86.2, Cyst of pancreas                           R93.2, Abnormal findings on diagnostic imaging of                            liver and biliary tract                           I89.9, Noninfective disorder of lymphatic vessels  and lymph nodes, unspecified                           R93.3, Abnormal findings on diagnostic imaging of                            other parts of digestive tract                           K83.8, Other specified diseases of biliary tract                           R93.5, Abnormal findings on diagnostic imaging of                            other abdominal regions, including retroperitoneum CPT copyright 2019 [employer]. All rights reserved. The codes documented in this report are preliminary and upon coder review may  be revised to meet current compliance requirements. Justice Britain, MD 08/14/2018 5:19:28 AM Number of Addenda: 0

## 2018-08-15 DIAGNOSIS — R748 Abnormal levels of other serum enzymes: Secondary | ICD-10-CM

## 2018-08-15 DIAGNOSIS — K315 Obstruction of duodenum: Secondary | ICD-10-CM

## 2018-08-15 LAB — HEPATITIS PANEL, ACUTE
HCV Ab: <0.1 s/co ratio (ref 0.0–0.9)
Hep A IgM: NEGATIVE
Hep B C IgM: NEGATIVE
Hepatitis B Surface Ag: NEGATIVE

## 2018-08-15 LAB — COMPREHENSIVE METABOLIC PANEL
ALT: 48 U/L — ABNORMAL HIGH (ref 0–44)
AST: 37 U/L (ref 15–41)
Albumin: 3 g/dL — ABNORMAL LOW (ref 3.5–5.0)
Alkaline Phosphatase: 338 U/L — ABNORMAL HIGH (ref 38–126)
Anion gap: 8 (ref 5–15)
BUN: <5 mg/dL — ABNORMAL LOW (ref 6–20)
CO2: 26 mmol/L (ref 22–32)
Calcium: 9.6 mg/dL (ref 8.9–10.3)
Chloride: 104 mmol/L (ref 98–111)
Creatinine, Ser: 0.79 mg/dL (ref 0.44–1.00)
GFR calc Af Amer: >60 mL/min (ref >60)
GFR calc non Af Amer: >60 mL/min (ref >60)
Glucose, Bld: 142 mg/dL — ABNORMAL HIGH (ref 70–99)
Potassium: 4.4 mmol/L (ref 3.5–5.1)
Sodium: 138 mmol/L (ref 135–145)
Total Bilirubin: 0.7 mg/dL (ref 0.3–1.2)
Total Protein: 6.3 g/dL — ABNORMAL LOW (ref 6.5–8.1)

## 2018-08-15 LAB — CBC
HCT: 33.3 % — ABNORMAL LOW (ref 36.0–46.0)
Hemoglobin: 10.8 g/dL — ABNORMAL LOW (ref 12.0–15.0)
MCH: 29.8 pg (ref 26.0–34.0)
MCHC: 32.4 g/dL (ref 30.0–36.0)
MCV: 92 fL (ref 80.0–100.0)
Platelets: 183 10*3/uL (ref 150–400)
RBC: 3.62 MIL/uL — ABNORMAL LOW (ref 3.87–5.11)
RDW: 11.5 % (ref 11.5–15.5)
WBC: 4.9 10*3/uL (ref 4.0–10.5)
nRBC: 0 % (ref 0.0–0.2)

## 2018-08-15 LAB — GLUCOSE, CAPILLARY
Glucose-Capillary: 104 mg/dL — ABNORMAL HIGH (ref 70–99)
Glucose-Capillary: 110 mg/dL — ABNORMAL HIGH (ref 70–99)

## 2018-08-15 MED ORDER — GLUCERNA SHAKE PO LIQD: 237.0000 mL | Freq: Three times a day (TID) | ORAL | 0 refills | Status: DC

## 2018-08-15 MED ORDER — TRAMADOL HCL 50 MG PO TABS
50.0000 mg | ORAL_TABLET | Freq: Four times a day (QID) | ORAL | Status: DC | PRN
  Administered 2018-08-15: 50 mg via ORAL
  Filled 2018-08-15: qty 1

## 2018-08-15 MED ORDER — HYOSCYAMINE SULFATE 0.5 MG/ML IJ SOLN: 0.5000 mg | Freq: Once | INTRAMUSCULAR | Status: DC

## 2018-08-15 MED ORDER — METOPROLOL TARTRATE 50 MG PO TABS: 25.0000 mg | ORAL_TABLET | Freq: Two times a day (BID) | ORAL | 0 refills | Status: DC

## 2018-08-15 MED ORDER — INSULIN DEGLUDEC 200 UNIT/ML ~~LOC~~ SOPN: 10.0000 [IU] | PEN_INJECTOR | Freq: Every morning | SUBCUTANEOUS | 0 refills | Status: DC

## 2018-08-15 MED ORDER — PANTOPRAZOLE SODIUM 40 MG PO TBEC: 40.0000 mg | DELAYED_RELEASE_TABLET | Freq: Two times a day (BID) | ORAL | 0 refills | Status: DC

## 2018-08-15 MED ORDER — PANTOPRAZOLE SODIUM 40 MG PO TBEC: 40.0000 mg | DELAYED_RELEASE_TABLET | Freq: Two times a day (BID) | ORAL | Status: DC

## 2018-08-15 NOTE — Discharge Summary (Signed)
Physician Discharge Summary  Mallory Stewart LYY:[id number] DOB: 07/16/1960 DOA: 08/12/2018  PCP: Mallory Sanders, MD  Admit date: 08/12/2018 Discharge date: 08/15/2018  Time spent: 40 minutes  Recommendations for Outpatient Follow-up:  1. Follow up outpatient CBC/CMP  2. Follow pending biopsy results with GI 3. Follow up with GI outpatient for repeat imaging and possibly repeat endoscopy and EUS.   4. Consider pancreatic enzyme replacement therapy as outpatient 5. Follow up with PCP outpatient for diabetes management.  Insulin decreased to 10 units daily for hypoglycemia while Mallory Stewart was admitted. 6. Follow up with cardiology outpatient.  New T wave inversions.  Troponin elevation during March admission, negative during this admission.  Echo without WMA. 7. Low fat diet at discharge, avoid roughage per GI 8. Continue PPI BID until follow up with GI   Discharge Diagnoses:  Principal Problem:   Gastric outlet obstruction Active Problems:   HYPERCHOLESTEROLEMIA   Essential hypertension, benign   Fatty liver   Chronic pain   EKG abnormalities   Discharge Condition: stable  Diet recommendation: heart healthy  Filed Weights   08/12/18 1507  Weight: 65.5 kg    History of present illness:  Mallory Stewart 58 y.o.femalewith medical history significant ofdiabetes type 1 with recent DKA, hypertension, chronic pancreatitisWho was admitted on March 16 with DKA and discharged home in good health.Patient returns today due to intractable nausea vomiting and abdominal pain. Symptoms have been going on for 3 days now. Mallory Stewart has been taking her nausea medicine but not relieved. Mallory Stewart has pain at 8 out of 10 in the epigastric region radiating to her back. Mallory Stewart denied taking NSAIDs or alcohol. Denied any new medications. Denied any sick contacts and did not eat outside the home. Patient was seen in the ER evaluated and found to have gastric outlet obstruction due to pancreatic mass.  Mallory Stewart is therefore being admitted for work-up..  ED Course:Vitals and lab work all appear to be within normal. Glucose is 120. ALT is 126 AST 96 lipase 116 and alkaline phosphatase of 486 with albumin of 3.4. Urinalysis essentially negative. CT abdomen pelvis showed Distal stomach thickening with proximal transverse duodenum. Finding associated with the head of pancreas suspected mass or inflammation. Gastric outlet obstruction as Mallory Stewart result of that. GI was consulted and we are consulted for admission.  Mallory Stewart was admitted for gastric outlet obstruction.  Mallory Stewart had Mallory Stewart CT scan with findings suggestive of chronic pancreatitis (see report).  Mallory Stewart had EGD with esophagitis, gastritis and Mallory Stewart duodenal deformity (see report).  EUS with findings c/w chronic pancreatitis.  Mallory Stewart was started on PPI BID.  He diet was gradually advanced.  Her biopsies were pending at discharge.  Hospitalization was complicated by hypoglycemia (her insulin was decreased at discharge) and an abnormal EKG (workup here unremarkable, recommended cards f/u outpatient).  Mallory Stewart'll need repeat imaging with GI outpatient with follow up of biopsies.  Mallory Stewart may need repeat EGD/EUS.  Hospital Course:  1. Gastric outlet obstruction: Imaging notable for abnormal wall thickening in the distal stomach and descending/proximal transverse duodenum.  Associated with edema and ill definition involving the head of the pancreas on Mallory Stewart background of features suggesting chronic pancreatitis.  Concern for GOO related to chronic pancreatitis (see image report and GI c/s).   Advanced to soft diet on discharge.  Continue low fat diet/avoid etoh/avoid roughage per GI. S/p EGD/EUS - notable for grade C esophagitis, gastritis (biopsied), duodenal deformity (biopsied).   EUS with evidence of chronic pancreatitis.  See  report. Recommending IV PPI -> transition to PO.  Follow bx results (pending). Will need follow up with GI in 8 weeks to ensure there is no pancreatic  lesion Will need to consider repeat endoscopy and EUS  # Biliary Obstruction: related to above, appreciate GI input  2.Diabetes:A1c 3/18 13.3.   Hypoglycemia 4/8.  Mallory Stewart was asymptomatic with these episodes. Home insulin 40 units tresiba daily BG appropriate on day of discharge Will decrease to 10 units lantus at discharge (Mallory Stewart needs basal insulin given hx DKA) Diabetic education   3.Essential hypertension: Blood pressure is controlled. Resume metoprolol at 25 mg BID (lower dose with holding parameters).  Resume lisinopril.   4.chronic pain:Continue home regimen and monitor.  5.Abnormal EKG: Troponin negative.  No CP or SOB.  EKG with diffuse T wave inversions in II, III, aVF and V3, V4, V5, V6.  Will need outpatient cards follow up. Echo with normal EF (see report)   # Elevated LFTs: likely related to above.  Follow acute hepatitis panel.  Improved.   Procedures: EGD Impression: - No gross lesions in proximal/middle esophagus. LA Grade C esophagitis in distal esophagus. Non-obstructing Schatzki ring at [name]. - Gastritis. Biopsied for HP. - Duodenal deformity with associated Mucosal changes in the duodenum. Biopsied to rule out adenomatous tissue or mass/lesion infiltrative. There is associated stenosis in this region that only allowed an adult endoscope to pass through the region, no larger scopes would be able to traverse. - Normal appearing ampulla but only visualized with forward viewing endoscope. - Distal D2 normal appearing. EUS Impression: - Pancreatic parenchymal abnormalities consisting of atrophy, hyperechoic foci, lobularity with honeycombing and hyperechoic strands were noted in the entire pancreas. These changes are consistent based on [place] criteria for diagnosis of Chronic Pancreatitis. - The pancreatic duct had [name] normal endosonographic appearance in the pancreatic head, genu of the pancreas, body of the pancreas and tail of the  pancreas. - Mallory Stewart cystic lesion was seen in the pancreatic body and pancreatic tail. Tissue has not been obtained. However, the endosonographic appearance is suggestive of benign inflammatory change and possible small pseudocyst. - No pancreatic masses were noted, but decreased sensitivity for findings masses/lesions as Ulysess Witz result of severe chronic pancreatitis changes throughout. - There was dilation in the common bile duct and in the common hepatic duct. - There was [place] Frieden suggestion of Antonius Hartlage distal stenosis in the CBD as it enters into the HOP. - No malignant-appearing lymph nodes were visualized in the celiac region (level 20), peripancreatic region and porta hepatis region. - The patient will be observed post-procedure, until all discharge criteria are met. - Return patient to hospital ward for ongoing care. - Full liquid diet today. - Initiate IV PPI BID and transition if tolerating PO diet in the next couple of days to PO PPI BID. - Based on her overall ability and calorie counts in the next 48-72 hours, will dictate need for other supplemental feeding (I would like her to have 3-4 Ensures per day) to optimize calories and passage through the congested/swollen Duodenum. - Await path results. - Observe patient's clinical course. - If fails calorie counts then would consider an UGI Series. - If LFTs continue to rise then will need to consider role of percutaneous drainage if any evidence of cholangitis. Could consider role of ERCP once again,but with inability to traverse the region today with duodenoscope, repeat attempt would likely be unsuccesful but may need to be considered. - If biopsies are unremarkable for malignant/infiltrative process,  then my hypothesis is that this is Tanajah Boulter bout of pancreatitis induced by her chronic idiopathic pancreatitis (previous notation of some alcohol use though patient denies overt long-term high intake). The patient could benefit from post-duodenal bulb feedings  via Cortrak if Mallory Stewart fails. This is not typical [place] Pancreatitis either, but this has been associated with Chronic Pancreatitis at times. May benefit from initiation of Pancreas Enzyme Replacement Therapy (Chronic Pancreatitis dosing can be weight based in setting of significant EPI but can be helpful in Khairi Garman pain perspective for some individuals up to 72000 units of Lipase with each meal). Let us see how Mallory Stewart does in the interim and will discuss the utility of this during hospitalization and with follow up with Dr. Carlean Purl at some point in future. - The findings and recommendations were discussed with the patient. - The findings and recommendations were discussed with the referring physician.  Echo IMPRESSIONS   1. The left ventricle has normal systolic function, with an ejection fraction of 55-60%. The cavity size was normal. Left ventricular diastolic parameters were normal. 2. The right ventricle has normal systolic function. The cavity was normal. There is no increase in right ventricular wall thickness. 3. Left atrial size was moderately dilated. 4. Mild thickening of the mitral valve leaflet. There is mild mitral annular calcification present. 5. The tricuspid valve is grossly normal. 6. The aortic valve is tricuspid. Mild thickening of the aortic valve. Mild calcification of the aortic valve.  Consultations:  GI  Discharge Exam: Vitals:   08/15/18 0413 08/15/18 0428  BP: (!) 154/110 (!) 146/94  Pulse: 64   Resp: 16   Temp: 97.7 F (36.5 C)   SpO2: 97%    Feels better.  Ready for d/c.  General: No acute distress. Cardiovascular: Heart sounds show Kyrstal Monterrosa regular rate, and rhythm.  Lungs: Clear to auscultation bilaterally  Abdomen: Soft, nontender, nondistended  Neurological: Alert and oriented 3. Moves all extremities 4 . Cranial nerves II through XII grossly intact. Skin: Warm and dry. No rashes or lesions. Extremities: No clubbing or cyanosis. No edema.   Psychiatric: Mood and affect are normal. Insight and judgment are appropriate.  Discharge Instructions   Discharge Instructions    Call MD for:  difficulty breathing, headache or visual disturbances   Complete by:  As directed    Call MD for:  extreme fatigue   Complete by:  As directed    Call MD for:  hives   Complete by:  As directed    Call MD for:  persistant dizziness or light-headedness   Complete by:  As directed    Call MD for:  persistant nausea and vomiting   Complete by:  As directed    Call MD for:  redness, tenderness, or signs of infection (pain, swelling, redness, odor or green/yellow discharge around incision site)   Complete by:  As directed    Call MD for:  severe uncontrolled pain   Complete by:  As directed    Call MD for:  temperature >100.4   Complete by:  As directed    Diet - low sodium heart healthy   Complete by:  As directed    Diet - low sodium heart healthy   Complete by:  As directed    Discharge instructions   Complete by:  As directed    You were seen for gastric outlet syndrome.  This was thought to be due to chronic pancreatitis.  Dr. Rush Landmark performed an EGD and endoscopic ultrasound.  The biopsies that he took during this procedure are currently pending.  Please follow up with gastroenterology as an outpatient.  They will review your biopsy results with you and may also start you on pancreatic enzyme replacement therapy.    You should have Deavon Podgorski repeat CT scan in about 8 weeks.  Gastroenterology may also consider Nima Kemppainen repeat endoscopy in 6-8 weeks to reevaluate the area.  Please continue the nutrition supplements as recommended.  Continue the soft diet at discharge and slowly advance your diet as tolerated.   Continue protonix twice daily until you follow up with gastroenterology as an outpatient.  Your echocardiogram was essentially normal.  Please follow up with your PCP as an outpatient to repeat an EKG.  I've asked cardiology to  schedule you for follow up because of your abnormal EKG, but your workup here was reassuring.   Decrease your tresiba to 10 units daily.  Continue checking your blood sugars.  Watch for low blood sugars (less than 70).  Call your doctor if you notice recurrently low or high blood sugars.  Bring Seanna Sisler log to your doctor to see in follow up.  Return for new, recurrent, or worsening issues.  Please ask your PCP to request records from this hospitalization so they know what was done and what the next steps will be.   Increase activity slowly   Complete by:  As directed    Increase activity slowly   Complete by:  As directed      Allergies as of 08/15/2018      Reactions   Gluten Meal Other (See Comments)   unk   Simvastatin Other (See Comments)   Elevated lfts?      Medication List    STOP taking these medications   gabapentin 300 MG capsule Commonly known as:  NEURONTIN     TAKE these medications   feeding supplement (GLUCERNA SHAKE) Liqd Take 237 mLs by mouth 3 (three) times daily between meals for 30 days.   fluticasone 50 MCG/ACT nasal spray Commonly known as:  FLONASE Place 2 sprays into both nostrils every morning.   glucose blood test strip Commonly known as:  ONE TOUCH ULTRA TEST CHECK BLOOD SUGAR TWO TIMES DAILY AS DIRECTED   Insulin Degludec 200 UNIT/ML Sopn Commonly known as:  [place] FlexTouch Inject 10 Units into the skin every morning for 30 days. What changed:  how much to take   levocetirizine 5 MG tablet Commonly known as:  XYZAL Take 5 mg by mouth at bedtime.   lisinopril 40 MG tablet Commonly known as:  PRINIVIL,ZESTRIL Take 1 tablet (40 mg total) by mouth daily.   metoprolol tartrate 50 MG tablet Commonly known as:  LOPRESSOR Take 0.5 tablets (25 mg total) by mouth 2 (two) times daily. What changed:  how much to take   multivitamin with minerals Tabs tablet Take 1 tablet by mouth daily.   ondansetron 4 MG tablet Commonly known as:  ZOFRAN Take  1 tablet (4 mg total) by mouth every 8 (eight) hours as needed for nausea or vomiting.   ONE TOUCH ULTRA 2 w/Device Kit Use to check blood sugars two times day   onetouch ultrasoft lancets Use to check blood sugar two times Amire Gossen day.   pantoprazole 40 MG tablet Commonly known as:  PROTONIX Take 1 tablet (40 mg total) by mouth 2 (two) times daily for 30 days.   Pen Needles 31G X 5 MM Misc 1 application by Does not apply route daily.  Allergies  Allergen Reactions  . Gluten Meal Other (See Comments)    unk  . Simvastatin Other (See Comments)    Elevated lfts?   Follow-up Information    Mallory Sanders, MD Follow up.   Specialty:  Family Medicine Why:  Call for follow up appointment Contact information: [place] [address] [phone]        Gatha Mayer, MD Follow up.   Specialty:  Gastroenterology Why:  Call for follow up appointment Contact information: [address] [place] [address] [phone]            The results of significant diagnostics from this hospitalization (including imaging, microbiology, ancillary and laboratory) are listed below for reference.    Significant Diagnostic Studies: Ct Abdomen Pelvis Wo Contrast  Result Date: 07/21/2018 CLINICAL DATA:  Weight loss, nausea, vomiting, abnormal LFTs. EXAM: CT CHEST, ABDOMEN AND PELVIS WITHOUT CONTRAST TECHNIQUE: Multidetector CT imaging of the chest, abdomen and pelvis was performed following the standard protocol without IV contrast. COMPARISON:  CT abdomen and pelvis 07/26/2016 FINDINGS: CT CHEST FINDINGS Cardiovascular: Scattered aortic and coronary artery calcifications. Heart is normal size. Aorta is normal caliber. Mediastinum/Nodes: No mediastinal, hilar, or axillary adenopathy. Lungs/Pleura: No confluent opacities or effusions. Musculoskeletal: Chest wall soft tissues are unremarkable. No acute bony abnormality. CT ABDOMEN PELVIS FINDINGS Hepatobiliary: No  focal liver abnormality is seen. Status post cholecystectomy. No biliary dilatation. Pancreas: Appears to be atrophy of the pancreatic body and tail. There is fullness in the region the pancreatic head, difficult to visualize well and separate from the adjacent duodenum on this noncontrast study. Spleen: No focal abnormality.  Normal size. Adrenals/Urinary Tract: No adrenal abnormality. No focal renal abnormality. No stones or hydronephrosis. Urinary bladder is unremarkable. Stomach/Bowel: There is significant gas and fluid distention of the stomach. The distal stomach and proximal duodenum are difficult to evaluate with lack of oral and IV contrast. Cannot exclude wall thickening in this area. Remainder of the small bowel is decompressed. Large bowel unremarkable. Appendix normal. Vascular/Lymphatic: Aortic atherosclerosis. No enlarged abdominal or pelvic lymph nodes. Reproductive: Uterus and adnexa unremarkable.  No mass. Other: No free fluid or free air. Musculoskeletal: No acute bony abnormality. IMPRESSION: Abnormal appearance in the region of the distal stomach and 1st and 2nd portions of the duodenum. There appears to be some wall thickening in this area. The stomach is significantly distended with fluid and gas. In addition, there appears to be soft tissue fullness in the region of the pancreatic head. This area is difficult to evaluate with the lack of IV and oral contrast. Consider further evaluation with contrast enhanced CT with oral and IV contrast. This could be performed after NG tube placement to decompress the stomach. Coronary artery disease. No acute cardiopulmonary disease. Electronically Signed   By: Rolm Baptise M.D.   On: 07/21/2018 03:25   Ct Chest Wo Contrast  Result Date: 07/21/2018 CLINICAL DATA:  Weight loss, nausea, vomiting, abnormal LFTs. EXAM: CT CHEST, ABDOMEN AND PELVIS WITHOUT CONTRAST TECHNIQUE: Multidetector CT imaging of the chest, abdomen and pelvis was performed following  the standard protocol without IV contrast. COMPARISON:  CT abdomen and pelvis 07/26/2016 FINDINGS: CT CHEST FINDINGS Cardiovascular: Scattered aortic and coronary artery calcifications. Heart is normal size. Aorta is normal caliber. Mediastinum/Nodes: No mediastinal, hilar, or axillary adenopathy. Lungs/Pleura: No confluent opacities or effusions. Musculoskeletal: Chest wall soft tissues are unremarkable. No acute bony abnormality. CT ABDOMEN PELVIS FINDINGS Hepatobiliary: No focal liver abnormality  is seen. Status post cholecystectomy. No biliary dilatation. Pancreas: Appears to be atrophy of the pancreatic body and tail. There is fullness in the region the pancreatic head, difficult to visualize well and separate from the adjacent duodenum on this noncontrast study. Spleen: No focal abnormality.  Normal size. Adrenals/Urinary Tract: No adrenal abnormality. No focal renal abnormality. No stones or hydronephrosis. Urinary bladder is unremarkable. Stomach/Bowel: There is significant gas and fluid distention of the stomach. The distal stomach and proximal duodenum are difficult to evaluate with lack of oral and IV contrast. Cannot exclude wall thickening in this area. Remainder of the small bowel is decompressed. Large bowel unremarkable. Appendix normal. Vascular/Lymphatic: Aortic atherosclerosis. No enlarged abdominal or pelvic lymph nodes. Reproductive: Uterus and adnexa unremarkable.  No mass. Other: No free fluid or free air. Musculoskeletal: No acute bony abnormality. IMPRESSION: Abnormal appearance in the region of the distal stomach and 1st and 2nd portions of the duodenum. There appears to be some wall thickening in this area. The stomach is significantly distended with fluid and gas. In addition, there appears to be soft tissue fullness in the region of the pancreatic head. This area is difficult to evaluate with the lack of IV and oral contrast. Consider further evaluation with contrast enhanced CT with  oral and IV contrast. This could be performed after NG tube placement to decompress the stomach. Coronary artery disease. No acute cardiopulmonary disease. Electronically Signed   By: Rolm Baptise M.D.   On: 07/21/2018 03:25   US Abdomen Complete  Result Date: 07/21/2018 CLINICAL DATA:  Abdominal pain for 5 days. Vomiting. History of cholecystectomy, pancreatitis, hypertension, and diabetes. EXAM: ABDOMEN ULTRASOUND COMPLETE COMPARISON:  CT abdomen and pelvis 07/21/2018 FINDINGS: Gallbladder: Surgically absent. Common bile duct: Diameter: 12 mm Liver: No focal lesion identified. Within normal limits in parenchymal echogenicity. Portal vein is patent on color Doppler imaging with normal direction of blood flow towards the liver. IVC: No abnormality visualized. Pancreas: Not visualized. Spleen: Size and appearance within normal limits. Right Kidney: Length: 10.8 cm. Echogenicity within normal limits. No mass or hydronephrosis visualized. Left Kidney: Length: 10.4 cm. Echogenicity within normal limits. No mass or hydronephrosis visualized. Abdominal aorta: No aneurysm visualized. Other findings: None. IMPRESSION: 1. Mild extrahepatic biliary dilatation status post cholecystectomy, correlate with laboratory studies. 2. Nonvisualization of the pancreas. Electronically Signed   By: Logan Bores M.D.   On: 07/21/2018 09:33   Ct Abdomen Pelvis W Contrast  Result Date: 08/12/2018 CLINICAL DATA:  Abdominal pain. EXAM: CT ABDOMEN AND PELVIS WITH CONTRAST TECHNIQUE: Multidetector CT imaging of the abdomen and pelvis was performed using the standard protocol following bolus administration of intravenous contrast. CONTRAST:  158m OMNIPAQUE IOHEXOL 300 MG/ML  SOLN COMPARISON:  07/21/2018 FINDINGS: Lower chest: Emphysema. Hepatobiliary: No suspicious focal abnormality within the liver parenchyma. Mild intrahepatic biliary duct dilatation noted. Gallbladder surgically absent. Extrahepatic common duct dilated up to 14 mm  diameter. Common bile duct tapers abruptly in the head of the pancreas, near the ampulla. Pancreas: Pancreatic tail markedly atrophic with 16 mm cystic focus identified on 28/3. Head of pancreas appears expanded and ill-defined with some associated edema. Numerous tiny calcifications in the pancreatic parenchyma are compatible with chronic pancreatitis. Spleen: No splenomegaly. No focal mass lesion. Adrenals/Urinary Tract: No adrenal nodule or mass. Kidneys unremarkable. No evidence for hydroureter. Bladder is decompressed. Stomach/Bowel: Stomach is unremarkable. No gastric wall thickening. No evidence of outlet obstruction. Mild wall thickening noted in the gastric antrum. Duodenal bulb shows substantial irregular wall  thickening and edema as does the descending and proximal transverse segments of the duodenum. Jejunal loops unremarkable. The terminal ileum is normal. The appendix is normal. Diverticuli are seen scattered along the entire length of the colon without CT findings of diverticulitis. Vascular/Lymphatic: There is abdominal aortic atherosclerosis without aneurysm. Small lymph nodes are seen in the region of the pancreatic head. 12 mm short axis hepato duodenal ligament lymph node is associated. Reproductive: The uterus is unremarkable.  There is no adnexal mass. Other: No intraperitoneal free fluid. Musculoskeletal: No worrisome lytic or sclerotic osseous abnormality. IMPRESSION: 1. Abnormal wall thickening in the distal stomach and descending/proximal transverse duodenum. This finding is associated with edema and ill definition involving the head of pancreas on Patriciann Becht background of features suggesting chronic pancreatitis. Intra and extrahepatic biliary duct dilatation noted with abrupt tapering of the common bile duct in the head of the pancreas near the ampulla. There are some scattered peripancreatic and hepato duodenal ligament lymph nodes. Imaging features may be related to pancreatitis resulting in  narrowing of the common bile duct and biliary dilatation. However, neoplasm in the head of the pancreas or inflammatory/peptic ulcer disease in the distal stomach/duodenum are also considerations. Close follow-up recommended. 2. Atrophy in the tail of pancreas with 16 mm low-density, probably cystic lesion associated in the pancreatic tail region, potentially pseudocyst. Close follow-up recommended. Electronically Signed   By: Misty Stanley M.D.   On: 08/12/2018 19:07   Dg Chest Portable 1 View  Result Date: 07/21/2018 CLINICAL DATA:  Shortness of Breath EXAM: PORTABLE CHEST 1 VIEW COMPARISON:  None. FINDINGS: Heart and mediastinal contours are within normal limits. No focal opacities or effusions. No acute bony abnormality. IMPRESSION: No active disease. Electronically Signed   By: Rolm Baptise M.D.   On: 07/21/2018 00:46   Dg Abd Portable 1v  Result Date: 07/21/2018 CLINICAL DATA:  Nasogastric tube placement EXAM: PORTABLE ABDOMEN - 1 VIEW COMPARISON:  Chest CT from earlier today FINDINGS: Nasogastric tube loops through the stomach with tip at the fundus. The upper abdominal bowel gas pattern is normal. In bases are clear. Cholecystectomy clips. IMPRESSION: Nasogastric tube loops through the stomach with tip at the fundus. Electronically Signed   By: Monte Fantasia M.D.   On: 07/21/2018 05:24    Microbiology: No results found for this or any previous visit (from the past 240 hour(s)).   Labs: Basic Metabolic Panel: Recent Labs  Lab 08/12/18 1536 08/13/18 0430 08/14/18 0211 08/15/18 0814  NA 141 140 138 138  K 3.6 3.0* 4.1 4.4  CL 102 105 106 104  CO2 '24 25 22 26  '$ GLUCOSE 120* 56* 189* 142*  BUN 6 6 <5* <5*  CREATININE 0.66 0.56 0.56 0.79  CALCIUM 9.8 8.7* 8.4* 9.6  MG  --   --  1.3*  --    Liver Function Tests: Recent Labs  Lab 08/12/18 1536 08/13/18 0430 08/14/18 0211 08/15/18 0814  AST 96* 62* 33 37  ALT 125* 86* 57* 48*  ALKPHOS 486* 406* 327* 338*  BILITOT 1.1 0.8 0.4  0.7  PROT 7.1 5.8* 5.2* 6.3*  ALBUMIN 3.4* 2.7* 2.5* 3.0*   Recent Labs  Lab 08/12/18 1536  LIPASE 116*   No results for input(s): AMMONIA in the last 168 hours. CBC: Recent Labs  Lab 08/12/18 1536 08/13/18 0430 08/14/18 0211 08/15/18 0814  WBC 7.5 5.4 4.0 4.9  HGB 12.2 9.7* 9.2* 10.8*  HCT 36.3 30.1* 28.6* 33.3*  MCV 90.5 91.8 91.7 92.0  PLT 182 179 146* 183   Cardiac Enzymes: Recent Labs  Lab 08/13/18 1046  TROPONINI <0.03   BNP: BNP (last 3 results) No results for input(s): BNP in the last 8760 hours.  ProBNP (last 3 results) No results for input(s): PROBNP in the last 8760 hours.  CBG: Recent Labs  Lab 08/14/18 1124 08/14/18 1632 08/14/18 2132 08/15/18 0416 08/15/18 0806  GLUCAP 178* 241* 135* 104* 110*       Signed:  Fayrene Helper MD.  [facility] 08/15/2018, 11:49 AM

## 2018-08-15 NOTE — Progress Notes (Signed)
Pt is tolerating diet. No nausea, no vomiting. Denies abd pain. Ambulating around the unit. Discharge instructions given to pt. Discharged to home picked up by spouse.

## 2018-08-15 NOTE — Progress Notes (Signed)
GI ATTENDING  Case discussed with Dr. Meridee Score including endoscopic findings.  Interval data reviewed.  Hemoglobin 10.8.  Alkaline phosphatase about the same.  Contacted by primary service who is anticipating patient discharge.  GI impression and recommendations as follows:  IMPRESSION 1.  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  Chronic benign distal biliary stricture with chronic elevation of alkaline phosphatase and biliary ductal dilation.  She is at risk long-term for secondary biliary cirrhosis. 3.  Acute on chronic pancreatitis with duodenal stenosis and abnormal mucosa as described.  Proximal gastric outlet obstruction as a result.  Biopsies pending  RECOMMENDATIONS 1.  Low-fat diet.  Eat small portions more frequently and avoid roughage for now 2.  Avoid alcohol 3.  GI will follow-up biopsies 4.  Routine GI follow-up with her primary gastroenterologist, Dr. Leone Payor in a few weeks  Wilhemina Bonito. Eda Keys., M.D. [facility] Division of Gastroenterology

## 2018-08-15 NOTE — Discharge Instructions (Signed)
Nutrition Post Hospital Stay Proper nutrition can help your body recover from illness and injury.   Foods and beverages high in protein, vitamins, and minerals help rebuild muscle loss, promote healing, & reduce fall risk.   In addition to eating healthy foods, a nutrition shake is an easy, delicious way to get the nutrition you need during and after your hospital stay  It is recommended that you continue to drink 3 bottles per day of:       Glucerna or equivalent for at least 1 month (30 days) after your hospital stay   Tips for adding a nutrition shake into your routine: As allowed, drink one with vitamins or medications instead of water or juice Enjoy one as a tasty mid-morning or afternoon snack Drink cold or make a milkshake out of it Drink one instead of milk with cereal or snacks Use as a coffee creamer   Available at the following grocery stores and pharmacies:           * Karin Golden * [employer] * [employer]co  * Rite Aid          * Walmart * Sam's Club  * Walgreens      * Target  * BJ's   * [facility]  * [employer]   * [facility] [phone]            For COUPONS visit: www.ensure.com/join or RoleLink.com.br   Suggested Substitutions Ensure Plus = Boost Plus = Carnation Breakfast Essentials = Boost Compact Ensure Active Clear = Boost Breeze Glucerna Shake = Boost Glucose Control = Carnation Breakfast Essentials SUGAR FREE

## 2018-08-15 NOTE — Progress Notes (Signed)
Pt discharging home with spouse. Pt has hospital follow up, insurance and transportation home.

## 2018-08-15 NOTE — Progress Notes (Signed)
Nutrition Follow-up  DOCUMENTATION CODES:   Not applicable  INTERVENTION:   -Continue Glucerna Shake po TID, each supplement provides 220 kcal and 10 grams of protein  NUTRITION DIAGNOSIS:   Inadequate oral intake related to altered GI function as evidenced by NPO status.  Progressing; advanced to heart healthy/ carb modified diet   GOAL:   Patient will meet greater than or equal to 90% of their needs  Progressing  MONITOR:   Diet advancement, Labs, Weight trends, Skin, I & O's  REASON FOR ASSESSMENT:   Consult Assessment of nutrition requirement/status  ASSESSMENT:   Mallory Stewart is a 58 y.o. female with medical history significant of diabetes type 1 with recent DKA, hypertension, chronic pancreatitis Who was admitted on March 16 with DKA and discharged home in good health.  Patient returns today due to intractable nausea vomiting and abdominal pain.  Symptoms have been going on for 3 days now.  She has been taking her nausea medicine but not relieved.  She has pain at 8 out of 10 in the epigastric region radiating to her back.  She denied taking NSAIDs or alcohol.  Denied any new medications.  Denied any sick contacts and did not eat outside the home.  Patient was seen in the ER evaluated and found to have gastric outlet obstruction due to pancreatic mass.  She is therefore being admitted for work-up.  4/8- s/p Procedure(s): EGD and EUS, which revealed no gross lesions in proximal/middle esophagus; LA Grade C esophagitis in distal esophagus; Non-obstructing Schatzki ring at [place]; gastritis (biopsised for h-pylori); Duodenal deformity with associated Mucosal (biopsied to rule out adenomatous tissue or mass/lesion infiltrative);  EUS consistent with chronic pancreatitis; cystic lesion was seen in the pancreatic bodyand pancreatic tail- suggestive of benign inflammatory change andpossible small pseudocyst; no pancreatic masses detected; dilation in the common bile  duct and inthe common hepatic duct, suggestion of a distal stenosis inthe CBD as it enters into the HOP; advanced to full liquid diet  Reviewed I/O's: -210 ml x 24 hours and +2.6 L since admission  UOP: 1.2 L x 24 hours  Case discussed with Dr. Lowell GuitarPowell prior to visit. Plan for pt to discharge home today. MD requested RD assess pt this AM and get a feel for her PO intake, as well as encourage her to continue oral nutrition supplements at home. MD would like to pt continue Glucerna.   Spoke with pt at bedside, who is familiar with this RD due to prior admission. Pt reveals appetite had improved greatly from last hospitalization up until 4 days PTA. Pt reports she was consuming 3 meals per day, as well as a spoonful of peanut butter at night. She denies further weight loss since last hospital discharge- she suspects she gained weight from admission secondary to IV fluids.   Pt reports feeling horribly 4 days PTA- she was unable to keep any foods or liquids down. Pt reports she tried to eat the first two days, "but gave up" the latter two. Since EGD, pt "feels so much better, it's unbelieveable". She confirms good tolerance of diet and intake. She confirms consuming 100% of breakfast and dinner yesterday and about 50% of lunch. RD also observed pt's breakfast tray. She is tolerating solid food without difficulty. Pt consumed one Glucerna supplement last night and tolerated well. Pt consumed approximately 1152 kcals and 48 grams of protein yesterday (approximately 58% of needs), however, pt was on a full liquid diet for most of the day,  Discussed importance of good meal and supplement intake to promote healing. Discussed appropriate supplements and ways to increase calories and protein in her diet. Pt reports she has been more conscious about her food choices secondary to her DM. She has an outpatient visit with [facility] next month.   Labs reviewed: CBGS: 110-241 (inpatient orders for glycemic control are  0-9 units insulin aspart every 4 hours and 10 units insulin glargine daily).   NUTRITION - FOCUSED PHYSICAL EXAM:    Most Recent Value  Orbital Region  No depletion  Upper Arm Region  Mild depletion  Thoracic and Lumbar Region  No depletion  Buccal Region  No depletion  Temple Region  No depletion  Clavicle Bone Region  No depletion  Clavicle and Acromion Bone Region  No depletion  Scapular Bone Region  No depletion  Dorsal Hand  No depletion  Patellar Region  No depletion  Anterior Thigh Region  No depletion  Posterior Calf Region  No depletion  Edema (RD Assessment)  None  Hair  Reviewed  Eyes  Reviewed  Mouth  Reviewed  Skin  Reviewed  Nails  Reviewed       Diet Order:   Diet Order            Diet - low sodium heart healthy        Diet - low sodium heart healthy        Diet heart healthy/carb modified Room service appropriate? Yes; Fluid consistency: Thin  Diet effective now              EDUCATION NEEDS:   No education needs have been identified at this time  Skin:  Skin Assessment: Reviewed RN Assessment  Last BM:  08/11/18  Height:   Ht Readings from Last 1 Encounters:  08/12/18 5\' 9"  (1.753 m)    Weight:   Wt Readings from Last 1 Encounters:  08/12/18 65.5 kg    Ideal Body Weight:  65.9 kg  BMI:  Body mass index is 21.31 kg/m.  Estimated Nutritional Needs:   Kcal:  2000-2200  Protein:  100-115 grams  Fluid:  > 2 L    Gerber Penza A. Mayford Knife, RD, LDN, CDCES Registered Dietitian II Certified Diabetes Care and Education Specialist Pager: [phone] After hours Pager: [phone]

## 2018-08-18 ENCOUNTER — Ambulatory Visit: Payer: 59 | Admitting: *Deleted

## 2018-08-18 ENCOUNTER — Telehealth: Payer: Self-pay | Admitting: Family Medicine

## 2018-08-18 DIAGNOSIS — E119 Type 2 diabetes mellitus without complications: Secondary | ICD-10-CM

## 2018-08-18 MED ORDER — INSULIN DEGLUDEC 200 UNIT/ML ~~LOC~~ SOPN: 40.0000 [IU] | PEN_INJECTOR | Freq: Every morning | SUBCUTANEOUS | 0 refills | Status: DC

## 2018-08-18 NOTE — Telephone Encounter (Signed)
Have her return to her pre hospital dose... 40 Units daily of Tresieba I believe. Verify with pt. Send in at that dose for refills. Have pt do a virtual follow up

## 2018-08-18 NOTE — Telephone Encounter (Signed)
Best number [phone] Pt called stating she was at [place] last week  And needs hospital follow up before 4/24  Do  you want virtual appointment on in office appointment   Pt blood sugar  Today 378 Yesterday 275  Needs refill on  tresiba [facility] [place]  hosptial prescribed pantoprazole bid  Insurance will only pay for 1 time a day Needs rx fixed so insurance will pay for this.  Pt missed gi last week she will call to r/s

## 2018-08-18 NOTE — Telephone Encounter (Signed)
PA for Pantoprazole has been completed on CoverMyMeds.  Awaiting decision.  Can't refill Tresiba until I hear back from Dr. Ermalene Searing in case she wants to change her dose.

## 2018-08-18 NOTE — Telephone Encounter (Signed)
Xitlaly notified as instructed by telephone.  Refill sent in for Tresiba at 40 units daily.  Lab appointment scheduled for 08/20/2018 at 9:15 am and Dr. Ermalene Searing 08/21/2018 at 2:00 pm for Doxy.Me.  Will forward back to Dr. Ermalene Searing to put in future lab orders.

## 2018-08-18 NOTE — Telephone Encounter (Signed)
Spoke to patient and was advised that she was in the hospital last week and they adjusted her insulin down to 10 units a day because her blood sugar was so low while she was in the hospital. Patient stated that she had bumped her insulin up to 40 units a day before she had gone into the hospital. Patient stated that her blood sugar this morning was 378 and she has a slight headache. Patient wants to know how she should increase her insulin since her blood sugar is so high?  See previous request regarding refill and Prior authorization on Pantoprazole.

## 2018-08-19 ENCOUNTER — Telehealth: Payer: Self-pay

## 2018-08-19 ENCOUNTER — Encounter: Payer: Self-pay | Admitting: Gastroenterology

## 2018-08-19 NOTE — Telephone Encounter (Signed)
Patient is returning your call.  I did give her PA information. Patient wants to know if letter was received from GI Doctor from hospital. Patient would like a call back

## 2018-08-19 NOTE — Telephone Encounter (Signed)
PA approved from 08/18/2018 through 08/18/2019.

## 2018-08-19 NOTE — Telephone Encounter (Signed)
Left detailed VM w COVID screen and curbside info   

## 2018-08-19 NOTE — Telephone Encounter (Signed)
Discharged 08/15/18 from [facility]. Per discharge summary, patient is to f/u with PCP for diabetes management.  Transitional Care Management Follow-up Telephone Call    Date discharged? 08/15/2018  How have you been since you were released from the hospital? Recovering.   Any patient concerns? None at this time.    Items Reviewed:  Medications reviewed: Yes  Allergies reviewed: Yes  Dietary changes reviewed: Yes  Referrals reviewed: Yes   Functional Questionnaire:  Independent - I Dependent - D    Activities of Daily Living (ADLs):    Personal hygiene - I Dressing - I Eating - I Maintaining continence - I Transferring - I   Independent Activities of Daily Living (iADLs): Basic communication skills - I Transportation - I Meal preparation  - I Shopping - I Housework - I Managing medications - I  Managing personal finances - I   Confirmed importance and date/time of follow-up visits scheduled YES  Provider Appointment booked with PCP 08/21/18 @ 1400  Confirmed with patient if condition begins to worsen call PCP or go to the ER.  Patient was given the office number and encouraged to call back with question or concerns: YES

## 2018-08-19 NOTE — Telephone Encounter (Signed)
Letter written by Lemar Lofty., MD about Mallory Stewart's biopsies read to patient over the telephone.

## 2018-08-20 ENCOUNTER — Telehealth: Payer: Self-pay | Admitting: Family Medicine

## 2018-08-20 ENCOUNTER — Other Ambulatory Visit (INDEPENDENT_AMBULATORY_CARE_PROVIDER_SITE_OTHER): Payer: 59

## 2018-08-20 ENCOUNTER — Other Ambulatory Visit: Payer: Self-pay

## 2018-08-20 DIAGNOSIS — E119 Type 2 diabetes mellitus without complications: Secondary | ICD-10-CM | POA: Diagnosis not present

## 2018-08-20 LAB — CBC WITH DIFFERENTIAL/PLATELET
Basophils Absolute: 0 10*3/uL (ref 0.0–0.1)
Basophils Relative: 0.5 % (ref 0.0–3.0)
Eosinophils Absolute: 0.4 10*3/uL (ref 0.0–0.7)
Eosinophils Relative: 6.8 % — ABNORMAL HIGH (ref 0.0–5.0)
HCT: 36 % (ref 36.0–46.0)
Hemoglobin: 12.4 g/dL (ref 12.0–15.0)
Lymphocytes Relative: 29.2 % (ref 12.0–46.0)
Lymphs Abs: 1.6 10*3/uL (ref 0.7–4.0)
MCHC: 34.3 g/dL (ref 30.0–36.0)
MCV: 88.8 fl (ref 78.0–100.0)
Monocytes Absolute: 0.3 10*3/uL (ref 0.1–1.0)
Monocytes Relative: 6.5 % (ref 3.0–12.0)
Neutro Abs: 3 10*3/uL (ref 1.4–7.7)
Neutrophils Relative %: 57 % (ref 43.0–77.0)
Platelets: 236 10*3/uL (ref 150.0–400.0)
RBC: 4.05 Mil/uL (ref 3.87–5.11)
RDW: 12.2 % (ref 11.5–15.5)
WBC: 5.3 10*3/uL (ref 4.0–10.5)

## 2018-08-20 LAB — COMPREHENSIVE METABOLIC PANEL
ALT: 22 U/L (ref 0–35)
AST: 23 U/L (ref 0–37)
Albumin: 3.9 g/dL (ref 3.5–5.2)
Alkaline Phosphatase: 262 U/L — ABNORMAL HIGH (ref 39–117)
BUN: 11 mg/dL (ref 6–23)
CO2: 30 mEq/L (ref 19–32)
Calcium: 9.9 mg/dL (ref 8.4–10.5)
Chloride: 97 mEq/L (ref 96–112)
Creatinine, Ser: 0.77 mg/dL (ref 0.40–1.20)
GFR: 77.66 mL/min (ref >60.00)
Glucose, Bld: 300 mg/dL — ABNORMAL HIGH (ref 70–99)
Potassium: 4 mEq/L (ref 3.5–5.1)
Sodium: 135 mEq/L (ref 135–145)
Total Bilirubin: 0.6 mg/dL (ref 0.2–1.2)
Total Protein: 7.3 g/dL (ref 6.0–8.3)

## 2018-08-20 NOTE — Telephone Encounter (Signed)
PA approval letter [facility]ed [facility] [place] at [phone].   Left message for [name] that I faxed over approval letter and if [facility] still has issues running it through, then they need to call [employer].

## 2018-08-20 NOTE — Telephone Encounter (Signed)
Patient went to [facility] to pick up Protonix and the pharmacy told her they need a prior auth for the second dose. Patient said Lupita Leash received the prior auth, but the pharmacy said they didn't receive it.

## 2018-08-21 ENCOUNTER — Encounter: Payer: Self-pay | Admitting: Family Medicine

## 2018-08-21 ENCOUNTER — Other Ambulatory Visit: Payer: 59

## 2018-08-21 ENCOUNTER — Ambulatory Visit (INDEPENDENT_AMBULATORY_CARE_PROVIDER_SITE_OTHER): Payer: 59 | Admitting: Family Medicine

## 2018-08-21 VITALS — Ht 68.25 in | Wt 145.0 lb

## 2018-08-21 DIAGNOSIS — K311 Adult hypertrophic pyloric stenosis: Secondary | ICD-10-CM

## 2018-08-21 DIAGNOSIS — E119 Type 2 diabetes mellitus without complications: Secondary | ICD-10-CM

## 2018-08-21 DIAGNOSIS — E43 Unspecified severe protein-calorie malnutrition: Secondary | ICD-10-CM

## 2018-08-21 DIAGNOSIS — R748 Abnormal levels of other serum enzymes: Secondary | ICD-10-CM | POA: Diagnosis not present

## 2018-08-21 DIAGNOSIS — R9431 Abnormal electrocardiogram [ECG] [EKG]: Secondary | ICD-10-CM | POA: Diagnosis not present

## 2018-08-21 NOTE — Assessment & Plan Note (Signed)
Associated with edema and ill definition involving the head of the pancreas on a background of features suggesting chronic pancreatitis. EUS with evidence of chronic pancreatitis.   Has follow up with Gi 4/30 for further assessment. Pt now getting back to regular diet, pushing water intake, avoiding high fat/no ETOH and avoiding roughage.

## 2018-08-21 NOTE — Assessment & Plan Note (Signed)
Working on increasing po intake.

## 2018-08-21 NOTE — Assessment & Plan Note (Signed)
Sugars decreasing now back on 40 units ddaily. She will titrate up insulin 2 units every 2 days if FBS not at goal. She does have new pt appt with ENDO tomorrow.

## 2018-08-21 NOTE — Progress Notes (Signed)
VIRTUAL VISIT Due to national recommendations of social distancing due to [place] 19, a virtual visit is felt to be most appropriate for this patient at this time.   I connected with the patient on 08/21/18 at  2:00 PM EDT by virtual telehealth platform and verified that I am speaking with the correct person using two identifiers.   I discussed the limitations, risks, security and privacy concerns of performing an evaluation and management service by  virtual telehealth platform and the availability of in person appointments. I also discussed with the patient that there may be a patient responsible charge related to this service. The patient expressed understanding and agreed to proceed.  Patient location: Home Provider Location: [facility] Participants: Eliezer Lofts and Lynda Rainwater   Chief Complaint  Patient presents with  . Hospitalization Follow-up    History of Present Illness: 58 year old diabetic female presents for follow up RE: recent hospital admission for pancreatitis, unclear etiology. 08/12/2018  1. Chronic pancreatitis, gastric outlet obstruction: Abdominal pain, N/V, weight loss:    Follow up with GI outpatient 09/04/2018 for repeat imaging and possibly repeat endoscopy and EUS.   Imaging notable for abnormal wall thickening in the distal stomach and descending/proximal transverse duodenum. Associated with edema and ill definition involving the head of the pancreas on a background of features suggesting chronic pancreatitis. Concern for GOO related to chronic pancreatitis (see image report and GI c/s).  Advanced to soft diet on discharge.  Continue low fat diet/avoid etoh/avoid roughage per GI. S/p EGD/EUS - notable for grade C esophagitis, gastritis (biopsied), duodenal deformity (biopsied). EUS with evidence of chronic pancreatitis. See report. Recommending IV PPI -> transition to PO. Follow bx results (pending). Will need follow up with GI in 8 weeks to  make sure  Pancreatic swelling resolved.   She has been doing better with po intake, drinking a lot fluid. Today she has felt much better with abd pain/nausea. No vomiting.  2. Diabetes:  She was discharged to low dose insulin at 10 Units given low blood sugars. Now back up to 40 Units in last 2 days. Using medications without difficulties: Hypoglycemic episodes:none Hyperglycemic episodes:yes.. but better now back on home dose Feet problems: no ulcers Blood Sugars averaging: FBS have come down from 243-378  Today AM 223. ( before went to hospital 120-150)  Has appt tommorow with ENDO.  3.  New T wave inversions seen in hospital: Follow up with cardiology outpatient.  New T wave inversions.  Troponin elevation during March admission, negative during this admission.  Echo without WMA.   cbc: nml  CMET: nml lfts, glucose 300, nml electrolytes, alk phos 262  COVID 19 screen No recent travel or known exposure to COVID19 The patient denies respiratory symptoms of COVID 19 at this time.  The importance of social distancing was discussed today.   Review of Systems  Constitutional: Negative for chills and fever.  HENT: Negative for congestion and ear pain.   Eyes: Negative for pain and redness.  Respiratory: Negative for cough and shortness of breath.   Cardiovascular: Negative for chest pain, palpitations and leg swelling.  Gastrointestinal: Negative for abdominal pain, blood in stool, constipation, diarrhea, nausea and vomiting.  Genitourinary: Negative for dysuria.  Musculoskeletal: Negative for falls and myalgias.  Skin: Negative for rash.  Neurological: Negative for dizziness.  Psychiatric/Behavioral: Negative for depression. The patient is not nervous/anxious.       Past Medical History:  Diagnosis Date  . B12 deficiency   .  Concussion   . DDD (degenerative disc disease), cervical   . Diabetes ([place])   . DKA, type 2 ([place])   . Hypertension   . Neuropathy   . Pancreatitis    . Seasonal allergies     reports that she has never smoked. She has never used smokeless tobacco. She reports current alcohol use of about 7.0 standard drinks of alcohol per week. She reports that she does not use drugs.   Current Outpatient Medications:  .  Blood Glucose Monitoring Suppl (ONE TOUCH ULTRA 2) w/Device KIT, Use to check blood sugars two times day, Disp: 1 each, Rfl: 0 .  feeding supplement, GLUCERNA SHAKE, (GLUCERNA SHAKE) LIQD, Take 237 mLs by mouth 3 (three) times daily between meals for 30 days., Disp: 21330 mL, Rfl: 0 .  fluticasone (FLONASE) 50 MCG/ACT nasal spray, Place 2 sprays into both nostrils every morning., Disp: , Rfl:  .  glucose blood (ONE TOUCH ULTRA TEST) test strip, CHECK BLOOD SUGAR TWO TIMES DAILY AS DIRECTED, Disp: 200 each, Rfl: 3 .  Insulin Degludec (TRESIBA FLEXTOUCH) 200 UNIT/ML SOPN, Inject 40 Units into the skin every morning., Disp: 18 mL, Rfl: 0 .  Insulin Pen Needle (PEN NEEDLES) 31G X 5 MM MISC, 1 application by Does not apply route daily., Disp: 100 each, Rfl: 0 .  Lancets (ONETOUCH ULTRASOFT) lancets, Use to check blood sugar two times a day., Disp: 200 each, Rfl: 3 .  levocetirizine (XYZAL) 5 MG tablet, Take 5 mg by mouth at bedtime., Disp: , Rfl:  .  lisinopril (PRINIVIL,ZESTRIL) 40 MG tablet, Take 1 tablet (40 mg total) by mouth daily., Disp: 90 tablet, Rfl: 3 .  metoprolol tartrate (LOPRESSOR) 50 MG tablet, Take 0.5 tablets (25 mg total) by mouth 2 (two) times daily., Disp: 180 tablet, Rfl: 0 .  Multiple Vitamin (MULTIVITAMIN WITH MINERALS) TABS tablet, Take 1 tablet by mouth daily., Disp: , Rfl:  .  ondansetron (ZOFRAN) 4 MG tablet, Take 1 tablet (4 mg total) by mouth every 8 (eight) hours as needed for nausea or vomiting., Disp: 20 tablet, Rfl: 0 .  pantoprazole (PROTONIX) 40 MG tablet, Take 1 tablet (40 mg total) by mouth 2 (two) times daily for 30 days., Disp: 60 tablet, Rfl: 0   Observations/Objective: Height 5' 8.25" (1.734 m), weight  145 lb (65.8 kg).  Physical Exam  Physical Exam Constitutional:      General: She is not in acute distress. Pulmonary:     Effort: Pulmonary effort is normal. No respiratory distress.  Neurological:     Mental Status: She is alert and oriented to person, place, and time.  Psychiatric:        Mood and Affect: Mood normal.        Behavior: Behavior normal.   Assessment and Plan Gastric outlet obstruction Associated with edema and ill definition involving the head of the pancreas on a background of features suggesting chronic pancreatitis. EUS with evidence of chronic pancreatitis.   Has follow up with Gi 4/30 for further assessment. Pt now getting back to regular diet, pushing water intake, avoiding high fat/no ETOH and avoiding roughage.  Diabetes mellitus without complication ([place]) Sugars decreasing now back on 40 units ddaily. She will titrate up insulin 2 units every 2 days if FBS not at goal. She does have new pt appt with ENDO tomorrow.  Protein-calorie malnutrition, severe Working on increasing po intake.  EKG abnormalities Not cleraly significant.. pt encouraged to call  To set up appt with [place]  cardiology.     I discussed the assessment and treatment plan with the patient. The patient was provided an opportunity to ask questions and all were answered. The patient agreed with the plan and demonstrated an understanding of the instructions.   The patient was advised to call back or seek an in-person evaluation if the symptoms worsen or if the condition fails to improve as anticipated.     Eliezer Lofts, MD

## 2018-08-21 NOTE — Assessment & Plan Note (Signed)
Not cleraly significant.. pt encouraged to call  To set up appt with [facility] cardiology.

## 2018-08-22 ENCOUNTER — Other Ambulatory Visit: Payer: Self-pay

## 2018-08-22 ENCOUNTER — Ambulatory Visit (INDEPENDENT_AMBULATORY_CARE_PROVIDER_SITE_OTHER): Payer: 59 | Admitting: Internal Medicine

## 2018-08-22 ENCOUNTER — Encounter: Payer: Self-pay | Admitting: Internal Medicine

## 2018-08-22 DIAGNOSIS — E1165 Type 2 diabetes mellitus with hyperglycemia: Secondary | ICD-10-CM

## 2018-08-22 DIAGNOSIS — Z794 Long term (current) use of insulin: Secondary | ICD-10-CM

## 2018-08-22 DIAGNOSIS — E785 Hyperlipidemia, unspecified: Secondary | ICD-10-CM

## 2018-08-22 DIAGNOSIS — E1159 Type 2 diabetes mellitus with other circulatory complications: Secondary | ICD-10-CM | POA: Insufficient documentation

## 2018-08-22 DIAGNOSIS — E1142 Type 2 diabetes mellitus with diabetic polyneuropathy: Secondary | ICD-10-CM | POA: Diagnosis not present

## 2018-08-22 MED ORDER — INSULIN DEGLUDEC 200 UNIT/ML ~~LOC~~ SOPN: 30.0000 [IU] | PEN_INJECTOR | Freq: Every morning | SUBCUTANEOUS | 11 refills | Status: DC

## 2018-08-22 MED ORDER — INSULIN ASPART 100 UNIT/ML FLEXPEN: 8.0000 [IU] | PEN_INJECTOR | Freq: Three times a day (TID) | SUBCUTANEOUS | 11 refills | Status: DC

## 2018-08-22 MED ORDER — PEN NEEDLES 31G X 5 MM MISC: 1.0000 "application " | Freq: Four times a day (QID) | 11 refills | Status: DC

## 2018-08-22 NOTE — Progress Notes (Signed)
Virtual Visit via Video Note  I connected with Mallory Stewart on 08/22/18 at  8:10 AM EDT by a video enabled telemedicine application and verified that I am speaking with the correct person using two identifiers.   I discussed the limitations of evaluation and management by telemedicine and the availability of in person appointments. The patient expressed understanding and agreed to proceed.  -Location of the patient : Home  -Location of the provider : Office  -The names of all persons participating in the telemedicine service : [place]     Name: Mallory Stewart  MRN/ DOB: [id number], [birth year]   Age/ Sex: 58 y.o., female    PCP: [name], MD   Reason for Endocrinology Evaluation: Type 2 Diabetes Mellitus     Date of Initial Endocrinology Visit: 08/22/2018     PATIENT IDENTIFIER: Mallory Stewart is a 58 y.o. female with a past medical history of HTN, T2DM, chronic pancreatitis and gastric outlet obstruction. The patient presented for initial endocrinology clinic visit on 08/22/2018 for consultative assistance with her diabetes management.    HPI: Mallory Stewart was    Diagnosed with T2DM in 2010. Pt had hospital admission in 2014 for pancreatitis secondary to ETOH intake, and another one in 2018 secondary to Gallstones but she al;so had binged the prior day on ETOH intake.  Prior Medications tried/Intolerance: Metformin stopped 2020. Insulin started 07/2018 Currently checking blood sugars 1 x / day,  before breakfast Hypoglycemia episodes : no       Hemoglobin A1c has ranged from 5.4% in 2016, peaking at 13.3%  In 2020. Patient required assistance for hypoglycemia: no Patient has required hospitalization within the last 1 year from hyper or hypoglycemia: Yes- March, 2020 for DKA   In terms of diet, the patient does not drink sugar-sweetened beverages. Eats 3 meals a day as well as snacks.    HOME DIABETES REGIMEN: Tresiba 40 units daily     Statin:  no - due to elevated LFT's ACE-I/ARB: yes Prior Diabetic Education: yes   GLUCOSE LOG:  Date Fasting  08/18/18 378  4/14 364  4/15 327  4/16 223    DIABETIC COMPLICATIONS: Microvascular complications:   Neuropathy   Denies: CKD, retinopathy   Last eye exam: Completed 2018  Macrovascular complications:    Denies: CAD, PVD, CVA   PAST HISTORY: Past Medical History:  Past Medical History:  Diagnosis Date  . B12 deficiency   . Concussion   . DDD (degenerative disc disease), cervical   . Diabetes ([place])   . DKA, type 2 ([place])   . Hypertension   . Neuropathy   . Pancreatitis   . Seasonal allergies     Past Surgical History:  Past Surgical History:  Procedure Laterality Date  . CHOLECYSTECTOMY N/A 07/28/2016   Procedure: LAPAROSCOPIC CHOLECYSTECTOMY WITH INTRAOPERATIVE CHOLANGIOGRAM;  Surgeon: Mickeal Skinner, MD;  Location: [place];  Service: General;  Laterality: N/A;  . ESOPHAGOGASTRODUODENOSCOPY (EGD) WITH PROPOFOL N/A 08/13/2018   Procedure: ESOPHAGOGASTRODUODENOSCOPY (EGD) WITH PROPOFOL;  Surgeon: Irving Copas., MD;  Location: [place];  Service: Gastroenterology;  Laterality: N/A;  . LEEP  1990's  . ORIF FOOT FRACTURE  09/2008   L 5th metatarsal  . REFRACTIVE SURGERY  2000  . TONSILLECTOMY    . UPPER ESOPHAGEAL ENDOSCOPIC ULTRASOUND (EUS) N/A 08/13/2018   Procedure: UPPER ESOPHAGEAL ENDOSCOPIC ULTRASOUND (EUS);  Surgeon: Irving Copas., MD;  Location: [place];  Service: Gastroenterology;  Laterality: N/A;  Social History:  reports that she has never smoked. She has never used smokeless tobacco. She reports current alcohol use of about 7.0 standard drinks of alcohol per week. She reports that she does not use drugs. Family History:  Family History  Problem Relation Age of Onset  . Other Mother        tachycardia.Marland KitchenMarland Kitchen?afib  . Stroke Father        after hernia suegery  . Prostate cancer Father   . Other Father        global  transient amnesia, unclear source  . Atrial fibrillation Sister   . Healthy Brother   . Healthy Brother   . Coronary artery disease Paternal Grandmother   . Heart attack Paternal Grandmother 58  . Brain cancer Maternal Grandfather        ?  Marland Kitchen Cancer Paternal Grandfather        ?  Marland Kitchen Breast cancer Maternal Grandmother       HOME MEDICATIONS: Allergies as of 08/22/2018      Reactions   Gluten Meal Other (See Comments)   unk   Simvastatin Other (See Comments)   Elevated lfts?      Medication List       Accurate as of August 22, 2018  7:41 AM. Always use your most recent med list.        feeding supplement (GLUCERNA SHAKE) Liqd Take 237 mLs by mouth 3 (three) times daily between meals for 30 days.   fluticasone 50 MCG/ACT nasal spray Commonly known as:  FLONASE Place 2 sprays into both nostrils every morning.   glucose blood test strip Commonly known as:  ONE TOUCH ULTRA TEST CHECK BLOOD SUGAR TWO TIMES DAILY AS DIRECTED   Insulin Degludec 200 UNIT/ML Sopn Commonly known as:  [place] FlexTouch Inject 40 Units into the skin every morning.   levocetirizine 5 MG tablet Commonly known as:  XYZAL Take 5 mg by mouth at bedtime.   lisinopril 40 MG tablet Commonly known as:  ZESTRIL Take 1 tablet (40 mg total) by mouth daily.   metoprolol tartrate 50 MG tablet Commonly known as:  LOPRESSOR Take 0.5 tablets (25 mg total) by mouth 2 (two) times daily.   multivitamin with minerals Tabs tablet Take 1 tablet by mouth daily.   ondansetron 4 MG tablet Commonly known as:  ZOFRAN Take 1 tablet (4 mg total) by mouth every 8 (eight) hours as needed for nausea or vomiting.   ONE TOUCH ULTRA 2 w/Device Kit Use to check blood sugars two times day   onetouch ultrasoft lancets Use to check blood sugar two times a day.   pantoprazole 40 MG tablet Commonly known as:  PROTONIX Take 1 tablet (40 mg total) by mouth 2 (two) times daily for 30 days.   Pen Needles 31G X 5 MM Misc  1 application by Does not apply route daily.        ALLERGIES: Allergies  Allergen Reactions  . Gluten Meal Other (See Comments)    unk  . Simvastatin Other (See Comments)    Elevated lfts?     REVIEW OF SYSTEMS: A comprehensive ROS was conducted with the patient and is negative except as per HPI and below:  Review of Systems  Eyes: Positive for blurred vision. Negative for pain.  Gastrointestinal: Positive for abdominal pain. Negative for diarrhea, nausea and vomiting.  Genitourinary: Negative for frequency.  Neurological: Positive for tingling. Negative for tremors.  Endo/Heme/Allergies: Negative for polydipsia.  Psychiatric/Behavioral: Negative for  depression. The patient is not nervous/anxious.         DATA REVIEWED:  Lab Results  Component Value Date   HGBA1C 13.3 (H) 07/23/2018   HGBA1C 9.2 (H) 12/30/2017   HGBA1C 6.9 (H) 07/02/2017   Lab Results  Component Value Date   LDLCALC 121 (H) 12/30/2017   CREATININE 0.77 08/20/2018    Lab Results  Component Value Date   CHOL 224 (H) 12/30/2017   HDL 71.10 12/30/2017   LDLCALC 121 (H) 12/30/2017   LDLDIRECT 160.5 02/20/2012   TRIG 157.0 (H) 12/30/2017   CHOLHDL 3 12/30/2017        ASSESSMENT / PLAN / RECOMMENDATIONS:   1) Type 2 Diabetes Mellitus, Poorly controlled, In the setting of Chronic Pancreatitis Withneuropathic complications - Most recent A1c of 13.3 %. Goal A1c < 7.0 %.    Plan: GENERAL: I have discussed with the patient the pathophysiology of diabetes. We went over the natural progression of the disease. We talked about both insulin resistance and insulin deficiency. We stressed the importance of lifestyle changes including diet and exercise. I explained the complications associated with diabetes including retinopathy, nephropathy, neuropathy as well as increased risk of cardiovascular disease. We went over the benefit seen with glycemic control.    I explained to the patient that diabetic  patients are at higher than normal risk for amputations. The patient was informed that diabetes is the number one cause of non-traumatic amputations in [place].  Discussed pharmacokinetics of basal/bolus insulin and the importance of taking prandial insulin with meals.   We also discussed avoiding sugar-sweetened beverages and snacks, when possible.   Her poorly controlled diabetes is due to sub-optimal medical management, dietary indiscretions, she has hx of excessive alcohol use in 2014, she was drinking 3-4 drinks a day, in 2018 she admitted to binge drinking prior to presentation with another bout of pancreatitis, recently she has been drinking no more then 2 glasses of wine , on average twice a week. I have discussed the  role of the pancrease and pancreatitis in diabetes, I have advised her to have no more 1 alcoholic beverage in a day, we discussed the CHO content of red wine vs white wine. I have encouraged her to stop all alcohol drinks if possible.   I don't believe she had a DKA due to insulin deficiency back in March,2020 her acidosis was due to high lactic acid levels. There was no beta hydroxybutyrate checked and elevated ketones in the urine could be due to lack of oral intake .   Given her hyperglycemia and glucose toxicity , she will need Multiple daily injections of insulin at this time, in the future we could discuss adding SGLT-2 inhibitors and see if she responds to them.   She is intolerant to Metformin, she is not a candidate for GLP-1 agonists, or DPP-4 inhibitors due to hx of pancreatitis.    MEDICATIONS:  Decrease Tresiba to 30 units Daily   Start Novolog 8 units TID QAC   EDUCATION / INSTRUCTIONS:  BG monitoring instructions: Patient is instructed to check her blood sugars 4 times a day, before meals and bedtime when possible.  Call [facility] if: BG persistently < 70 or > 300. . I reviewed the Rule of 15 for the treatment of hypoglycemia in  detail with the patient. Literature supplied.   2) Diabetic complications:   Eye: Does not have known diabetic retinopathy.   Neuro/ Feet: Does have known diabetic peripheral neuropathy.  Renal:  Patient does not have known baseline CKD. She is on an ACEI/ARB at present.   3) Lipids: Patient is not on a statin. There has been hesitance in starting her on statin due to elevated LFT'. Pt can go on statin with mildly elevated LFT's as long as they are less then 3x the normal limit with close monitoring.    4) Hypertension: Historically her BP has been above goal of < 140/90 mmHg.        I discussed the assessment and treatment plan with the patient. The patient was provided an opportunity to ask questions and all were answered. The patient agreed with the plan and demonstrated an understanding of the instructions.   The patient was advised to call back or seek an in-person evaluation if the symptoms worsen or if the condition fails to improve as anticipated.  F/u in 6 weeks    Signed electronically by: Mack Guise, MD  [facility]  [address] [place], [place] [address] Phone: [phone] FAX: [phone]   CC: [name], MD [place] [address] Phone: [phone] Fax: [phone]   Return to Endocrinology clinic as below: Future Appointments  Date Time Provider [place]  08/22/2018  8:10 AM [place] Mincey, Melanie Crazier, MD [facility] None  09/02/2018  9:45 AM Gatha Mayer, MD [facility]  09/08/2018  1:30 PM Shotwell, Guadalupe Maple, RN [facility] None  10/16/2018  8:15 AM [facility]  10/24/2018  8:45 AM [name], MD [facility]

## 2018-08-26 ENCOUNTER — Encounter: Payer: 59 | Admitting: Family Medicine

## 2018-08-29 ENCOUNTER — Ambulatory Visit: Payer: 59 | Admitting: Family Medicine

## 2018-09-02 ENCOUNTER — Encounter: Payer: Self-pay | Admitting: Internal Medicine

## 2018-09-02 ENCOUNTER — Other Ambulatory Visit: Payer: Self-pay

## 2018-09-02 ENCOUNTER — Ambulatory Visit (INDEPENDENT_AMBULATORY_CARE_PROVIDER_SITE_OTHER): Payer: 59 | Admitting: Internal Medicine

## 2018-09-02 DIAGNOSIS — Z1211 Encounter for screening for malignant neoplasm of colon: Secondary | ICD-10-CM | POA: Diagnosis not present

## 2018-09-02 DIAGNOSIS — K311 Adult hypertrophic pyloric stenosis: Secondary | ICD-10-CM | POA: Diagnosis not present

## 2018-09-02 DIAGNOSIS — K862 Cyst of pancreas: Secondary | ICD-10-CM | POA: Diagnosis not present

## 2018-09-02 DIAGNOSIS — K860 Alcohol-induced chronic pancreatitis: Secondary | ICD-10-CM | POA: Diagnosis not present

## 2018-09-02 DIAGNOSIS — K863 Pseudocyst of pancreas: Secondary | ICD-10-CM

## 2018-09-02 MED ORDER — PANTOPRAZOLE SODIUM 40 MG PO TBEC: 40.0000 mg | DELAYED_RELEASE_TABLET | Freq: Every day | ORAL | 0 refills | Status: DC

## 2018-09-02 NOTE — Assessment & Plan Note (Addendum)
She is much improved, avoiding alcohol.  She understands the relationship.  She does not have signs of pancreatic insufficiency based upon history.  She has a small cystic lesion in the pancreas in the pancreatic body and tail.  Being considered for follow-up EUS. - discussed and we will do CT abdomen pancreas protocol in late May

## 2018-09-02 NOTE — Assessment & Plan Note (Addendum)
April 2020 EUS showed a small cystic lesion in the body and tail most likely benign.  Being considered for follow-up EUS. Discussed w/ Dr. Meridee Score - plan is for CT scan pancreas protocol in late May [facility] in mid-May

## 2018-09-02 NOTE — Assessment & Plan Note (Signed)
Will place a colonoscopy recall for July to consider a screening colonoscopy later this year.

## 2018-09-02 NOTE — Progress Notes (Signed)
TELEHEALTH ENCOUNTER IN SETTING OF COVID-19 PANDEMIC - REQUESTED BY PATIENT SERVICE PROVIDED BY TELEMEDECINE - TYPE: Phone (failed Zoom AV) PATIENT LOCATION: Home PATIENT HAS CONSENTED TO TELEHEALTH VISIT PROVIDER LOCATION: OFFICE REFERRING PROVIDER PCP is dr. Diona Browner PARTICIPANTS OTHER THAN PATIENT:None TIME SPENT ON CALL:15 minutes    Mallory Stewart 58 y.o. Aug 17, 1960 [id number]  Assessment & Plan:  Gastric outlet obstruction Clinically resolved.  For the time being continue pantoprazole 40 mg daily.  I do not think she will need this long-term but would treat for 2 to 3 months and then see if she may come off it.  Chronic alcoholic pancreatitis ([place]) She is much improved, avoiding alcohol.  She understands the relationship.  She does not have signs of pancreatic insufficiency based upon history.  She has a small cystic lesion in the pancreas in the pancreatic body and tail.  Being considered for follow-up EUS.  Pancreatic pseudocyst/cyst April 2020 EUS showed a small cystic lesion in the body and tail most likely benign.  Being considered for follow-up EUS.  Colon cancer screening Will place a colonoscopy recall for July to consider a screening colonoscopy later this year.  I appreciate the opportunity to care for this patient. CC: Mallory Sanders, MD   Subjective:   Chief Complaint: Follow-up of chronic pancreatitis and gastric outlet obstruction mood  HPI The patient is seen via telehealth for follow-up of gastric outlet obstruction and chronic pancreatitis, I initially met her in hospital consultation on April 7.  She had imaging and clinical scenario of gastric outlet obstruction due to mass-effect in the head of the pancreas with edema and swelling in the duodenum.  She had biliary obstruction and dilation.  She had upper endoscopy with endoscopic ultrasound by Dr. Rush Landmark on April 9, and it demonstrated chronic pancreatitis with a small cyst lesion, inflammatory  changes in the distal stomach and the duodenal sweep biopsies benign and no H. pylori, and a dilated common bile duct.  She was a regular user of alcohol.  Since that time and released from the hospital she has not had alcohol and she is doing well without any pain nausea vomiting eating or diarrhea.  Stools which used to be loose when she was drinking regularly are now formed.  She says she does not miss drinking alcohol.  She recently saw endocrinology to try to help with improving control of her diabetes.  Labs from April 15 show alkaline phosphatase of 262 but her other liver chemistries are normal, her sugar was 300 that day her hemoglobin 12.4 which is improved.  Previously had elevated transaminases and a higher alkaline phosphatase. Full findings of the EUS/EGD below:  EUS EGD Impression: - No gross lesions in proximal/middle esophagus. LA Grade C esophagitis in distal esophagus. Non-obstructing Schatzki ring at [name]. - Gastritis. Biopsied for HP. - Duodenal deformity with associated Mucosal changes in the duodenum. Biopsied to rule out adenomatous tissue or mass/lesion infiltrative. There is associated stenosis in this region that only allowed an adult endoscope to pass through the region, no larger scopes would be able to traverse. - Normal appearing ampulla but only visualized with forward viewing endoscope. - Distal D2 normal appearing. EUS Impression: - Pancreatic parenchymal abnormalities consisting of atrophy, hyperechoic foci, lobularity with honeycombing and hyperechoic strands were noted in the entire pancreas. These changes are consistent based on [place] criteria for diagnosis of Chronic Pancreatitis. - The pancreatic duct had a normal endosonographic appearance in the pancreatic head, genu of  the pancreas, body of the pancreas and tail of the pancreas. - A cystic lesion was seen in the pancreatic body and pancreatic tail. Tissue has not been obtained. However, the  endosonographic appearance is suggestive of benign inflammatory change and possible small pseudocyst. - No pancreatic masses were noted, but decreased sensitivity for findings masses/lesions as a result of severe chronic pancreatitis changes throughout. - There was dilation in the common bile duct and in the common hepatic duct. - There was a suggestion of a distal stenosis in the CBD as it enters into the HOP. - No malignant-appearing lymph nodes were visualized in the celiac region (level 20), peripancreatic region and porta hepatis region.   Dr. Rush Landmark was not committed to EUS follow-up but wanted to consider that.  Wt Readings from Last 3 Encounters:  09/02/18 151 lb (68.5 kg)  08/21/18 145 lb (65.8 kg)  08/12/18 144 lb 5 oz (65.5 kg)    Allergies  Allergen Reactions  . Gluten Meal Other (See Comments)    unk  . Simvastatin Other (See Comments)    Elevated lfts?   Current Meds  Medication Sig  . Blood Glucose Monitoring Suppl (ONE TOUCH ULTRA 2) w/Device KIT Use to check blood sugars two times day  . fluticasone (FLONASE) 50 MCG/ACT nasal spray Place 2 sprays into both nostrils every morning.  Marland Kitchen glucose blood (ONE TOUCH ULTRA TEST) test strip CHECK BLOOD SUGAR TWO TIMES DAILY AS DIRECTED  . insulin aspart (NOVOLOG FLEXPEN) 100 UNIT/ML FlexPen Inject 8 Units into the skin 3 (three) times daily with meals.  . Insulin Degludec (TRESIBA FLEXTOUCH) 200 UNIT/ML SOPN Inject 30 Units into the skin every morning.  . Insulin Pen Needle (PEN NEEDLES) 31G X 5 MM MISC 1 application by Does not apply route 4 (four) times daily.  . Lancets (ONETOUCH ULTRASOFT) lancets Use to check blood sugar two times a day.  . levocetirizine (XYZAL) 5 MG tablet Take 5 mg by mouth at bedtime.  Marland Kitchen lisinopril (PRINIVIL,ZESTRIL) 40 MG tablet Take 1 tablet (40 mg total) by mouth daily.  . metoprolol tartrate (LOPRESSOR) 50 MG tablet Take 0.5 tablets (25 mg total) by mouth 2 (two) times daily.  . Multiple  Vitamin (MULTIVITAMIN WITH MINERALS) TABS tablet Take 1 tablet by mouth daily.  . ondansetron (ZOFRAN) 4 MG tablet Take 1 tablet (4 mg total) by mouth every 8 (eight) hours as needed for nausea or vomiting.  . [DISCONTINUED] pantoprazole (PROTONIX) 40 MG tablet Take 1 tablet (40 mg total) by mouth 2 (two) times daily for 30 days.   Past Medical History:  Diagnosis Date  . Alcohol-induced chronic pancreatitis ([place])   . B12 deficiency   . Concussion   . DDD (degenerative disc disease), cervical   . Diabetes ([place])   . DKA, type 2 ([place])   . Hypertension   . Neuropathy   . Seasonal allergies    Past Surgical History:  Procedure Laterality Date  . CHOLECYSTECTOMY N/A 07/28/2016   Procedure: LAPAROSCOPIC CHOLECYSTECTOMY WITH INTRAOPERATIVE CHOLANGIOGRAM;  Surgeon: Mickeal Skinner, MD;  Location: [place];  Service: General;  Laterality: N/A;  . ESOPHAGOGASTRODUODENOSCOPY (EGD) WITH PROPOFOL N/A 08/13/2018   Procedure: ESOPHAGOGASTRODUODENOSCOPY (EGD) WITH PROPOFOL;  Surgeon: Irving Copas., MD;  Location: [place];  Service: Gastroenterology;  Laterality: N/A;  . LEEP  1990's  . ORIF FOOT FRACTURE  09/2008   L 5th metatarsal  . REFRACTIVE SURGERY  2000  . TONSILLECTOMY    . 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 not traveling   regular exercise , walking 4-5 times a week   Diet fruits and veggies, water   Alcohol at least several glasses of wine a week Stopped 08/2018   Never smoker, no drug use   family history includes Atrial fibrillation in her sister; Brain cancer in her maternal grandfather; Breast  cancer in her maternal grandmother; Cancer in her paternal grandfather; Coronary artery disease in her paternal grandmother; Healthy in her brother and brother; Heart attack (age of onset: 90) in her paternal grandmother; Other in her father and mother; Prostate cancer in her father; Stroke in her father.   Review of Systems See HPI.

## 2018-09-02 NOTE — Patient Instructions (Addendum)
Good to speak to you today and hear that things are so much better.  I am glad to hear you are not having trouble avoiding alcohol as that is very important to stay well going forward.  Remember we talked about how important it is to get diabetes under control as much as possible for your overall health.  With your damaged pancreas that may be more difficult but insulin is the right treatment.  I discussed your situation with Dr. Meridee Score and we have decided to repeat a CT scan in mid to late May and you will need labs before then.  I anticipate contacting you about a screening colonoscopy later - in a few months.    I appreciate the opportunity to care for you. Iva Boop, MD, [facility]

## 2018-09-02 NOTE — Assessment & Plan Note (Signed)
Clinically resolved.  For the time being continue pantoprazole 40 mg daily.  I do not think she will need this long-term but would treat for 2 to 3 months and then see if she may come off it.

## 2018-09-04 ENCOUNTER — Telehealth: Payer: Self-pay | Admitting: Internal Medicine

## 2018-09-04 MED ORDER — INSULIN DEGLUDEC 200 UNIT/ML ~~LOC~~ SOPN: 28.0000 [IU] | PEN_INJECTOR | Freq: Every morning | SUBCUTANEOUS | 11 refills | Status: DC

## 2018-09-04 MED ORDER — INSULIN ASPART 100 UNIT/ML FLEXPEN: 6.0000 [IU] | PEN_INJECTOR | Freq: Three times a day (TID) | SUBCUTANEOUS | 11 refills | Status: DC

## 2018-09-04 NOTE — Telephone Encounter (Signed)
lft vm to return call 

## 2018-09-04 NOTE — Telephone Encounter (Signed)
Spoke to pt and her recent blood sugars have been as follows:  4/30 105 a.m. 4/29  115 a.m. 82 lunch 94 dinner 68 bed 4/28   126 a.m. 88 lunch 80 dinner 57 bed 4/27  178 a.m. 111 lunch 75 dinner  Pt currently taking novolog 8 units 3 times daily w/meals and tresiba 30 units every morning, please advise

## 2018-09-04 NOTE — Telephone Encounter (Signed)
Patient called states that she has been on fast acting insulin and that Dr Lonzo Cloud requested that she let her know if she has any concerns.  Patient advises that she has had 3 instances in the last week with evening blood sugars in the 50's and 60's.

## 2018-09-04 NOTE — Telephone Encounter (Signed)
Pt stated an understanding of changes and stated that she will call with an update on her readings

## 2018-09-08 ENCOUNTER — Ambulatory Visit: Payer: 59 | Admitting: *Deleted

## 2018-09-15 ENCOUNTER — Telehealth: Payer: Self-pay

## 2018-09-15 NOTE — Telephone Encounter (Signed)
Patient informed of appointment date/time and wrote it all down.

## 2018-09-15 NOTE — Telephone Encounter (Signed)
Left detailed message for patient to call me back. I have set up her CT scan for May 27th at 11:30AM, she is to check in at 11:00AM to drink the water prep there. She is to be NPO 2 hours with solids but may have liquids. This is to be done at [facility], [address], [address]. Phone # [phone]. I also need to remind her to come get labs drawn mid to late May, orders in for [facility].

## 2018-09-18 ENCOUNTER — Telehealth: Payer: Self-pay

## 2018-09-18 NOTE — Telephone Encounter (Signed)
Pt left v/m ;that 2 wks ago pt was playing frisbee with her dog and pt did something to her rt shoulder where rotator cuff is. Pt said difficulty sleeping at night due to pain and pt has to take her other hand and raise rt arm so can type on keyboard.  I returned pts call and left v/m for pt to cb [facility].

## 2018-09-18 NOTE — Telephone Encounter (Signed)
I scheduled pt for 8am Friday 5/15.

## 2018-09-18 NOTE — Telephone Encounter (Signed)
Will try to start virtual but this may need to become  In office

## 2018-09-19 ENCOUNTER — Encounter: Payer: Self-pay | Admitting: Family Medicine

## 2018-09-19 ENCOUNTER — Ambulatory Visit: Payer: 59 | Admitting: Family Medicine

## 2018-09-19 ENCOUNTER — Other Ambulatory Visit: Payer: Self-pay

## 2018-09-19 VITALS — BP 130/82 | HR 77 | Temp 97.6°F | Ht 68.25 in | Wt 163.5 lb

## 2018-09-19 DIAGNOSIS — M25511 Pain in right shoulder: Secondary | ICD-10-CM

## 2018-09-19 MED ORDER — DICLOFENAC SODIUM 75 MG PO TBEC: 75.0000 mg | DELAYED_RELEASE_TABLET | Freq: Two times a day (BID) | ORAL | 0 refills | Status: DC

## 2018-09-19 NOTE — Patient Instructions (Signed)
Stop ibuprofen.  Start diclofenac twice daily. Take with meals and continue protonix.  Start home physical therapy.   If pain not improving after 2 weeks call to make in office appointment with Dr. Patsy Lager for possible injection or further eval.

## 2018-09-19 NOTE — Assessment & Plan Note (Signed)
Most likely rotator cuff tendonitis. No red flags or indication for X-ray today.  Treat with NSAIDs and home PT.  Info given.  If not improving return for eval by Sm... possible Rosanne Sack and steroid injection.

## 2018-09-19 NOTE — Progress Notes (Signed)
Subjective:    Patient ID: Mallory Stewart, female    DOB: 10/25/1960, 58 y.o.   MRN: [id number]  HPI  58 year old female presents with new onset shoulder pain.  She reports 2 weeks of anterior right shoulder pain following an increase of playing frisbee with her dog. No fall.   She notes pain with movement of right shoulder, no neck pain.  Pain occ wakes her up at night when rolling over and makes it uncomfortable to sleep.  She denies arm weakness, numbness.  Pain is greatest with int and ext rotation and abduction. No change with motion of neck.   She does have a sebaceous scyst in left lateral neck that is increasing in size... not red.  Pain is better when she holds her arm. Ibuprofen 600 mg BID helps temporarily.  No fever.  Social History /Family History/Past Medical History reviewed in detail and updated in EMR if needed. Blood pressure 130/82, pulse 77, temperature 97.6 F (36.4 C), temperature source Oral, height 5' 8.25" (1.734 m), weight 163 lb 8 oz (74.2 kg).  Review of Systems  Constitutional: Negative for fatigue and fever.  HENT: Negative for congestion.   Eyes: Negative for pain.  Respiratory: Negative for cough and shortness of breath.   Cardiovascular: Negative for chest pain, palpitations and leg swelling.  Gastrointestinal: Negative for abdominal pain.  Genitourinary: Negative for dysuria and vaginal bleeding.  Musculoskeletal: Negative for back pain.  Neurological: Negative for syncope, light-headedness and headaches.  Psychiatric/Behavioral: Negative for dysphoric mood.       Objective:   Physical Exam Constitutional:      General: She is not in acute distress.    Appearance: Normal appearance. She is well-developed. She is not ill-appearing or toxic-appearing.  HENT:     Head: Normocephalic.     Right Ear: Hearing, tympanic membrane, ear canal and external ear normal. Tympanic membrane is not erythematous, retracted or bulging.     Left  Ear: Hearing, tympanic membrane, ear canal and external ear normal. Tympanic membrane is not erythematous, retracted or bulging.     Nose: No mucosal edema or rhinorrhea.     Right Sinus: No maxillary sinus tenderness or frontal sinus tenderness.     Left Sinus: No maxillary sinus tenderness or frontal sinus tenderness.     Mouth/Throat:     Pharynx: Uvula midline.  Eyes:     General: Lids are normal. Lids are everted, no foreign bodies appreciated.     Conjunctiva/sclera: Conjunctivae normal.     Pupils: Pupils are equal, round, and reactive to light.  Neck:     Musculoskeletal: Normal range of motion and neck supple.     Thyroid: No thyroid mass or thyromegaly.     Vascular: No carotid bruit.     Trachea: Trachea normal.  Cardiovascular:     Rate and Rhythm: Normal rate and regular rhythm.     Pulses: Normal pulses.     Heart sounds: Normal heart sounds, S1 normal and S2 normal. No murmur. No friction rub. No gallop.   Pulmonary:     Effort: Pulmonary effort is normal. No tachypnea or respiratory distress.     Breath sounds: Normal breath sounds. No decreased breath sounds, wheezing, rhonchi or rales.  Abdominal:     General: Bowel sounds are normal.     Palpations: Abdomen is soft.     Tenderness: There is no abdominal tenderness.  Musculoskeletal:     Right shoulder: She exhibits decreased range  of motion, tenderness, bony tenderness and pain. She exhibits no swelling, no effusion, no deformity and no laceration.     Cervical back: Normal. She exhibits no tenderness.     Comments: 2 cm sebaceous cyst in left neck.. no redness, no numbness, mobile  Skin:    General: Skin is warm and dry.     Findings: No rash.  Neurological:     Mental Status: She is alert.     Cranial Nerves: Cranial nerves are intact.     Sensory: Sensation is intact.     Motor: Motor function is intact.     Coordination: Coordination is intact.  Psychiatric:        Mood and Affect: Mood is not anxious or  depressed.        Speech: Speech normal.        Behavior: Behavior normal. Behavior is cooperative.        Thought Content: Thought content normal.        Judgment: Judgment normal.           Assessment & Plan:

## 2018-09-22 ENCOUNTER — Other Ambulatory Visit: Payer: Self-pay | Admitting: Family Medicine

## 2018-09-30 ENCOUNTER — Other Ambulatory Visit (INDEPENDENT_AMBULATORY_CARE_PROVIDER_SITE_OTHER): Payer: 59

## 2018-09-30 ENCOUNTER — Telehealth: Payer: Self-pay | Admitting: *Deleted

## 2018-09-30 DIAGNOSIS — K863 Pseudocyst of pancreas: Secondary | ICD-10-CM | POA: Diagnosis not present

## 2018-09-30 DIAGNOSIS — K860 Alcohol-induced chronic pancreatitis: Secondary | ICD-10-CM

## 2018-09-30 DIAGNOSIS — K862 Cyst of pancreas: Secondary | ICD-10-CM

## 2018-09-30 LAB — COMPREHENSIVE METABOLIC PANEL
ALT: 16 U/L (ref 0–35)
AST: 27 U/L (ref 0–37)
Albumin: 4.2 g/dL (ref 3.5–5.2)
Alkaline Phosphatase: 89 U/L (ref 39–117)
BUN: 23 mg/dL (ref 6–23)
CO2: 28 mEq/L (ref 19–32)
Calcium: 9.7 mg/dL (ref 8.4–10.5)
Chloride: 104 mEq/L (ref 96–112)
Creatinine, Ser: 0.85 mg/dL (ref 0.40–1.20)
GFR: 69.26 mL/min (ref >60.00)
Glucose, Bld: 130 mg/dL — ABNORMAL HIGH (ref 70–99)
Potassium: 4.5 mEq/L (ref 3.5–5.1)
Sodium: 141 mEq/L (ref 135–145)
Total Bilirubin: 0.7 mg/dL (ref 0.2–1.2)
Total Protein: 7 g/dL (ref 6.0–8.3)

## 2018-09-30 NOTE — Telephone Encounter (Signed)

## 2018-10-01 ENCOUNTER — Ambulatory Visit (INDEPENDENT_AMBULATORY_CARE_PROVIDER_SITE_OTHER)
Admission: RE | Admit: 2018-10-01 | Discharge: 2018-10-01 | Disposition: A | Discharge status: Home / Self Care | Payer: 59 | Source: Ambulatory Visit | Attending: Internal Medicine | Admitting: Internal Medicine

## 2018-10-01 ENCOUNTER — Other Ambulatory Visit: Payer: Self-pay

## 2018-10-01 DIAGNOSIS — K860 Alcohol-induced chronic pancreatitis: Secondary | ICD-10-CM

## 2018-10-01 DIAGNOSIS — K862 Cyst of pancreas: Secondary | ICD-10-CM

## 2018-10-01 DIAGNOSIS — K863 Pseudocyst of pancreas: Secondary | ICD-10-CM

## 2018-10-01 MED ORDER — IOHEXOL 300 MG/ML  SOLN
100.0000 mL | Freq: Once | INTRAMUSCULAR | Status: AC | PRN
  Administered 2018-10-01: 100 mL via INTRAVENOUS

## 2018-10-02 NOTE — Progress Notes (Signed)
Sheri,  Please let her know labs and CT scan much better. Pancreas still inflamed some.  I hope she is feeling better.  Does not seem like she needs another EUS - Dr. Meridee Score will provide input and let me know if he thinks otherwise  Please schedule a f/u with me for about 3-4 weeks from now and have her let us know if she has any concerns or ? Right now - I.e. any problems or is she still getting better (hope so)

## 2018-10-03 ENCOUNTER — Other Ambulatory Visit: Payer: Self-pay

## 2018-10-03 ENCOUNTER — Encounter: Payer: Self-pay | Admitting: Internal Medicine

## 2018-10-03 ENCOUNTER — Ambulatory Visit (INDEPENDENT_AMBULATORY_CARE_PROVIDER_SITE_OTHER): Payer: 59 | Admitting: Internal Medicine

## 2018-10-03 DIAGNOSIS — Z794 Long term (current) use of insulin: Secondary | ICD-10-CM | POA: Diagnosis not present

## 2018-10-03 DIAGNOSIS — E1142 Type 2 diabetes mellitus with diabetic polyneuropathy: Secondary | ICD-10-CM | POA: Diagnosis not present

## 2018-10-03 DIAGNOSIS — E1165 Type 2 diabetes mellitus with hyperglycemia: Secondary | ICD-10-CM

## 2018-10-03 MED ORDER — DAPAGLIFLOZIN PROPANEDIOL 5 MG PO TABS: 5.0000 mg | ORAL_TABLET | Freq: Every day | ORAL | 1 refills | Status: DC

## 2018-10-03 MED ORDER — INSULIN ASPART 100 UNIT/ML FLEXPEN: 4.0000 [IU] | PEN_INJECTOR | Freq: Three times a day (TID) | SUBCUTANEOUS | 11 refills | Status: DC

## 2018-10-03 NOTE — Progress Notes (Signed)
Virtual Visit via Video Note  I connected with Mallory Stewart on 10/03/18 at  8:30 AM EDT by a video enabled telemedicine application and verified that I am speaking with the correct person using two identifiers.   I discussed the limitations of evaluation and management by telemedicine and the availability of in person appointments. The patient expressed understanding and agreed to proceed.   -Location of the patient : Home  -Location of the provider : Office -The names of all persons participating in the telemedicine service : Pt and myself        Name: Mallory Stewart  Age/ Sex: 58 y.o., female   MRN/ DOB: [id number], 1961-03-07     PCP: [name], MD   Reason for Endocrinology Evaluation: Type 2 Diabetes Mellitus     Initial Endocrinology Clinic Visit: 08/22/2018    PATIENT IDENTIFIER: Mallory Stewart is a 58 y.o. female with a past medical history of HTN, T2DM, chronic pancreatitis and gastric outlet obstruction . The patient has followed with Endocrinology clinic since 08/22/2018 for consultative assistance with management of her diabetes.  DIABETIC HISTORY:  Mallory Stewart was diagnosed with T2DM in 2010.Pt had hospital admission in 2014 for pancreatitis secondary to ETOH intake, and another one in 2018 secondary to Gallstones but she al;so had binged the prior day on ETOH intake. Insulin started in 07/2018 . Her hemoglobin A1c has ranged from 5.4% in 2016, peaking at 13.3%  In 2020.  Prandial insulin 08/2018  SUBJECTIVE:   During the last visit (08/22/2018): We reduced Tresiba  From 40 units to 30 units, started Novolg at 8 units [facility]   Today (10/03/2018): Mallory Stewart is here for a virtual 4 month follow  Up on diabetes management.  She checks her blood sugars 3-4 times daily, preprandial and bedtime. The patient has had hypoglycemic episodes since the last clinic visit, which typically occur 2 x /week - most often occuring after lunch . The patient is  symptomatic with these episodes, with symptoms of shaking. Otherwise, the patient has not required any recent emergency interventions for hypoglycemia and has not had recent hospitalizations secondary to hyper or hypoglycemic episodes.    ROS: As per HPI and as detailed below: Review of Systems  Respiratory: Negative for cough and shortness of breath.   Cardiovascular: Negative for chest pain and palpitations.  Gastrointestinal: Negative for diarrhea and nausea.  Genitourinary: Negative for frequency.  Endo/Heme/Allergies: Negative for polydipsia.      HOME DIABETES REGIMEN:   Tyler Aas to 30 units Daily - has been taking 26 units    Novolog 8 units TID QAC- has been taking 6 units TIDQAC     GLUCOSE LOG:  Date Breakfast  Lunch  Dinner Bedtime  10/03/18 97     5/28 153 130 126   5/27 135 108 109 125  5/26 167 250 120 114       HISTORY:  Past Medical History:  Past Medical History:  Diagnosis Date   Alcohol-induced chronic pancreatitis ([place])    B12 deficiency    Concussion    DDD (degenerative disc disease), cervical    Diabetes ([place])    DKA, type 2 ([place])    Hypertension    Neuropathy    Seasonal allergies     Past Surgical History:  Past Surgical History:  Procedure Laterality Date   CHOLECYSTECTOMY N/A 07/28/2016   Procedure: LAPAROSCOPIC CHOLECYSTECTOMY WITH INTRAOPERATIVE CHOLANGIOGRAM;  Surgeon: Mickeal Skinner, MD;  Location: [place];  Service: General;  Laterality: N/A;   ESOPHAGOGASTRODUODENOSCOPY (EGD) WITH PROPOFOL N/A 08/13/2018   Procedure: ESOPHAGOGASTRODUODENOSCOPY (EGD) WITH PROPOFOL;  Surgeon: Rush Landmark Telford Nab., MD;  Location: [place];  Service: Gastroenterology;  Laterality: N/A;   LEEP  1990's   ORIF FOOT FRACTURE  09/2008   L 5th metatarsal   REFRACTIVE SURGERY  2000   TONSILLECTOMY     UPPER ESOPHAGEAL ENDOSCOPIC ULTRASOUND (EUS) N/A 08/13/2018   Procedure: UPPER ESOPHAGEAL ENDOSCOPIC ULTRASOUND (EUS);  Surgeon:  Irving Copas., MD;  Location: [facility];  Service: Gastroenterology;  Laterality: N/A;     Social History:  reports that she has never smoked. She has never used smokeless tobacco. She reports previous alcohol use of about 7.0 standard drinks of alcohol per week. She reports that she does not use drugs. Family History:  Family History  Problem Relation Age of Onset   Other Mother        tachycardia.Marland KitchenMarland Kitchen?afib   Stroke Father        after hernia suegery   Prostate cancer Father    Other Father        global transient amnesia, unclear source   Atrial fibrillation Sister    Healthy Brother    Healthy Brother    Coronary artery disease Paternal Grandmother    Heart attack Paternal Grandmother 36   Brain cancer Maternal Grandfather        ?   Cancer Paternal Grandfather        ?   Breast cancer Maternal Grandmother       HOME MEDICATIONS: Allergies as of 10/03/2018      Reactions   Gluten Meal Other (See Comments)   unk   Simvastatin Other (See Comments)   Elevated lfts?      Medication List       Accurate as of Oct 03, 2018  8:24 AM. If you have any questions, ask your nurse or doctor.        diclofenac 75 MG EC tablet Commonly known as:  VOLTAREN Take 1 tablet (75 mg total) by mouth 2 (two) times daily.   fluticasone 50 MCG/ACT nasal spray Commonly known as:  FLONASE Place 2 sprays into both nostrils every morning.   glucose blood test strip Commonly known as:  ONE TOUCH ULTRA TEST CHECK BLOOD SUGAR TWO TIMES DAILY AS DIRECTED   insulin aspart 100 UNIT/ML FlexPen Commonly known as:  NovoLOG FlexPen Inject 6 Units into the skin 3 (three) times daily with meals.   Insulin Degludec 200 UNIT/ML Sopn Commonly known as:  [place] FlexTouch Inject 28 Units into the skin every morning.   levocetirizine 5 MG tablet Commonly known as:  XYZAL Take 5 mg by mouth at bedtime.   lisinopril 40 MG tablet Commonly known as:  ZESTRIL Take 1 tablet  (40 mg total) by mouth daily.   metoprolol tartrate 50 MG tablet Commonly known as:  LOPRESSOR TAKE 1 TABLET BY MOUTH TWICE A DAY   multivitamin with minerals Tabs tablet Take 1 tablet by mouth daily.   ondansetron 4 MG tablet Commonly known as:  ZOFRAN Take 1 tablet (4 mg total) by mouth every 8 (eight) hours as needed for nausea or vomiting.   ONE TOUCH ULTRA 2 w/Device Kit Use to check blood sugars two times day   onetouch ultrasoft lancets Use to check blood sugar two times a day.   pantoprazole 40 MG tablet Commonly known as:  PROTONIX Take 1 tablet (40 mg total) by mouth daily  before breakfast.   Pen Needles 31G X 5 MM Misc 1 application by Does not apply route 4 (four) times daily.         DATA REVIEWED:  Lab Results  Component Value Date   HGBA1C 13.3 (H) 07/23/2018   HGBA1C 9.2 (H) 12/30/2017   HGBA1C 6.9 (H) 07/02/2017   Lab Results  Component Value Date   LDLCALC 121 (H) 12/30/2017   CREATININE 0.85 09/30/2018     Lab Results  Component Value Date   CHOL 224 (H) 12/30/2017   HDL 71.10 12/30/2017   LDLCALC 121 (H) 12/30/2017   LDLDIRECT 160.5 02/20/2012   TRIG 157.0 (H) 12/30/2017   CHOLHDL 3 12/30/2017         ASSESSMENT / PLAN / RECOMMENDATIONS:   1) Type 2 Diabetes Mellitus, Poorly controlled, In the setting of Chronic Pancreatitis Withneuropathic complications - Most recent A1c of 13.3 %. Goal A1c < 7.0 %.    Plan:  - Her BG's have improved a great deal. I have congratulated her on the hard work, compliance with glucose checks, taking medications and continuing with lifestyle changes.  - She is currently on 6 units of Novolog and continues to have hypoglycemia. We discussed oral glycemic agents to be able to taper off prandial insulin  - She intolerant to metformin and would like not not try it again. Due to hx of pancreatitis, she is not a candidate for DPP-4 inhibitors nor GLP-1 agonists. We discussed SU and SGLT-2 inhibitors.  - She  is interested in SGLT_2 inhibitors, we discussed risk of genital infections, we discussed benefits to include cardiovascular, glucose and weight.  - Once she starts on SGLT-2 inhibitors, she could stop Novolog but continue tresiba, but until then, she can use novolog 4 units with each meal      MEDICATIONS:  Continue Tresiba 28 unirs daily   Stop Novolog once Wilder Glade is started, but until then use 4 units TODQAC   Farxiga 5 mg daily   EDUCATION / INSTRUCTIONS:  BG monitoring instructions: Patient is instructed to check her blood sugars 4 times a day, before meals and bedtime.  Call [facility] if: BG persistently < 70 or > 300.  I reviewed the Rule of 15 for the treatment of hypoglycemia in detail with the patient. Literature supplied.       I discussed the assessment and treatment plan with the patient. The patient was provided an opportunity to ask questions and all were answered. The patient agreed with the plan and demonstrated an understanding of the instructions.   The patient was advised to call back or seek an in-person evaluation if the symptoms worsen or if the condition fails to improve as anticipated.   F/U in 8 weeks    Signed electronically by: Mack Guise, MD  [facility] Endocrinology  [address] [place], [place] [address] Phone: [phone] FAX: [phone]   CC: [name], MD [place] [address] Phone: [phone]  Fax: [phone]  Return to Endocrinology clinic as below: Future Appointments  Date Time Provider [place]  10/03/2018  8:30 AM Rilyn Upshaw, Melanie Crazier, MD [facility] None  10/16/2018  8:15 AM [facility]  10/20/2018 11:00 AM Shotwell, Guadalupe Maple, RN [facility] None  10/24/2018  8:40 AM [name], MD [facility] PEC  11/05/2018  9:00 AM Gatha Mayer, MD [facility]

## 2018-10-14 ENCOUNTER — Telehealth: Payer: Self-pay | Admitting: Family Medicine

## 2018-10-14 DIAGNOSIS — E538 Deficiency of other specified B group vitamins: Secondary | ICD-10-CM

## 2018-10-14 DIAGNOSIS — Z794 Long term (current) use of insulin: Secondary | ICD-10-CM

## 2018-10-14 DIAGNOSIS — R748 Abnormal levels of other serum enzymes: Secondary | ICD-10-CM

## 2018-10-14 DIAGNOSIS — E119 Type 2 diabetes mellitus without complications: Secondary | ICD-10-CM

## 2018-10-14 DIAGNOSIS — E1142 Type 2 diabetes mellitus with diabetic polyneuropathy: Secondary | ICD-10-CM

## 2018-10-14 NOTE — Telephone Encounter (Signed)
-----   Message from Terri J Walsh sent at 10/08/2018  2:40 PM EDT ----- Regarding: Lab orders for Thursday,6.11.20 Patient is scheduled for CPX labs, please order future labs, Thanks , Terri  

## 2018-10-14 NOTE — Telephone Encounter (Signed)
-----   Message from Ellamae Sia sent at 10/08/2018  2:40 PM EDT ----- Regarding: Lab orders for Thursday,6.11.20 Patient is scheduled for CPX labs, please order future labs, Thanks , Karna Christmas

## 2018-10-16 ENCOUNTER — Other Ambulatory Visit (INDEPENDENT_AMBULATORY_CARE_PROVIDER_SITE_OTHER): Payer: 59

## 2018-10-16 ENCOUNTER — Other Ambulatory Visit: Payer: Self-pay

## 2018-10-16 DIAGNOSIS — Z794 Long term (current) use of insulin: Secondary | ICD-10-CM

## 2018-10-16 DIAGNOSIS — E1142 Type 2 diabetes mellitus with diabetic polyneuropathy: Secondary | ICD-10-CM

## 2018-10-16 DIAGNOSIS — E538 Deficiency of other specified B group vitamins: Secondary | ICD-10-CM | POA: Diagnosis not present

## 2018-10-16 LAB — COMPREHENSIVE METABOLIC PANEL
ALT: 20 U/L (ref 0–35)
AST: 33 U/L (ref 0–37)
Albumin: 4.3 g/dL (ref 3.5–5.2)
Alkaline Phosphatase: 89 U/L (ref 39–117)
BUN: 22 mg/dL (ref 6–23)
CO2: 28 mEq/L (ref 19–32)
Calcium: 9.5 mg/dL (ref 8.4–10.5)
Chloride: 101 mEq/L (ref 96–112)
Creatinine, Ser: 0.95 mg/dL (ref 0.40–1.20)
GFR: 60.91 mL/min (ref >60.00)
Glucose, Bld: 93 mg/dL (ref 70–99)
Potassium: 4.9 mEq/L (ref 3.5–5.1)
Sodium: 139 mEq/L (ref 135–145)
Total Bilirubin: 0.8 mg/dL (ref 0.2–1.2)
Total Protein: 6.7 g/dL (ref 6.0–8.3)

## 2018-10-16 LAB — LIPID PANEL
Cholesterol: 201 mg/dL — ABNORMAL HIGH (ref 0–200)
HDL: 104.5 mg/dL (ref >39.00)
LDL Cholesterol: 82 mg/dL (ref 0–99)
NonHDL: 96.47
Total CHOL/HDL Ratio: 2
Triglycerides: 70 mg/dL (ref 0.0–149.0)
VLDL: 14 mg/dL (ref 0.0–40.0)

## 2018-10-16 LAB — VITAMIN B12: Vitamin B-12: 157 pg/mL — ABNORMAL LOW (ref 211–911)

## 2018-10-16 LAB — HEMOGLOBIN A1C: Hgb A1c MFr Bld: 7 % — ABNORMAL HIGH (ref 4.6–6.5)

## 2018-10-20 ENCOUNTER — Ambulatory Visit: Payer: 59 | Admitting: *Deleted

## 2018-10-24 ENCOUNTER — Ambulatory Visit (INDEPENDENT_AMBULATORY_CARE_PROVIDER_SITE_OTHER): Payer: 59 | Admitting: Family Medicine

## 2018-10-24 ENCOUNTER — Encounter: Payer: Self-pay | Admitting: Family Medicine

## 2018-10-24 VITALS — Temp 97.8°F | Wt 163.0 lb

## 2018-10-24 DIAGNOSIS — Z0000 Encounter for general adult medical examination without abnormal findings: Secondary | ICD-10-CM | POA: Diagnosis not present

## 2018-10-24 DIAGNOSIS — Z1211 Encounter for screening for malignant neoplasm of colon: Secondary | ICD-10-CM | POA: Diagnosis not present

## 2018-10-24 DIAGNOSIS — M25511 Pain in right shoulder: Secondary | ICD-10-CM

## 2018-10-24 DIAGNOSIS — E1142 Type 2 diabetes mellitus with diabetic polyneuropathy: Secondary | ICD-10-CM | POA: Diagnosis not present

## 2018-10-24 DIAGNOSIS — G5793 Unspecified mononeuropathy of bilateral lower limbs: Secondary | ICD-10-CM

## 2018-10-24 DIAGNOSIS — K860 Alcohol-induced chronic pancreatitis: Secondary | ICD-10-CM

## 2018-10-24 DIAGNOSIS — I10 Essential (primary) hypertension: Secondary | ICD-10-CM

## 2018-10-24 DIAGNOSIS — K311 Adult hypertrophic pyloric stenosis: Secondary | ICD-10-CM

## 2018-10-24 DIAGNOSIS — Z794 Long term (current) use of insulin: Secondary | ICD-10-CM

## 2018-10-24 DIAGNOSIS — E43 Unspecified severe protein-calorie malnutrition: Secondary | ICD-10-CM

## 2018-10-24 NOTE — Assessment & Plan Note (Signed)
Well controlled. Continue current medication.  

## 2018-10-24 NOTE — Patient Instructions (Signed)
Get yearly eye exam.  Keep up the great work on diet and exercise.

## 2018-10-24 NOTE — Assessment & Plan Note (Signed)
Recent flare up.. may restart gabapentin prn.

## 2018-10-24 NOTE — Assessment & Plan Note (Signed)
Not improving.. pt will call to set up appt with [facility] Dr. Lorelei Pont

## 2018-10-24 NOTE — Progress Notes (Signed)
VIRTUAL VISIT Due to national recommendations of social distancing due to [place] 19, a virtual visit is felt to be most appropriate for this patient at this time.   I connected with the patient on 10/24/18 at  8:40 AM EDT by virtual telehealth platform and verified that I am speaking with the correct person using two identifiers.   I discussed the limitations, risks, security and privacy concerns of performing an evaluation and management service by  virtual telehealth platform and the availability of in person appointments. I also discussed with the patient that there may be a patient responsible charge related to this service. The patient expressed understanding and agreed to proceed.  Patient location: Home Provider Location: [facility] Participants: Eliezer Lofts and Lynda Rainwater   Chief Complaint  Patient presents with  . Annual Exam    History of Present Illness: The patient is here for annual wellness exam and preventative care.   She is feeling much better overall  Shoulder pain: minimaly improved with  diclofenac   DM Followed by ENDO Dr. Kelton Pillar.. improved significantly! Lab Results  Component Value Date   HGBA1C 7.0 (H) 10/16/2018   Elevated Cholesterol: LDL at goal at last check with diet Lab Results  Component Value Date   CHOL 201 (H) 10/16/2018   HDL 104.50 10/16/2018   LDLCALC 82 10/16/2018   LDLDIRECT 160.5 02/20/2012   TRIG 70.0 10/16/2018   CHOLHDL 2 10/16/2018  Using medications without problems: Muscle aches:  Diet compliance: healthy Exercise: walking several days a week, water aerobics Other complaints:  Hypertension:  good control on lisinopril and metoprolol.         BP Readings from Last 3 Encounters:  09/19/18 130/82  08/15/18 (!) 146/94  08/01/18 140/90  using medication without problems or lightheadedness:  Chest pain with exertion: Edema: Short of breath: Average home BPs: Other issues:  chronic pain management.  chronic neuropathic pain in legs  No longer requiring narcotics.  She is no longer using gabapentin.. but sometimes p[ain is waking her up at night in last 2 weeks.  COVID 19 screen No recent travel or known exposure to COVID19 The patient denies respiratory symptoms of COVID 19 at this time.  The importance of social distancing was discussed today.   Review of Systems  Constitutional: Negative for chills and fever.  HENT: Negative for congestion and ear pain.   Eyes: Negative for pain and redness.  Respiratory: Negative for cough and shortness of breath.   Cardiovascular: Negative for chest pain, palpitations and leg swelling.  Gastrointestinal: Negative for abdominal pain, blood in stool, constipation, diarrhea, nausea and vomiting.  Genitourinary: Negative for dysuria.  Musculoskeletal: Negative for falls and myalgias.  Skin: Negative for rash.  Neurological: Negative for dizziness.  Psychiatric/Behavioral: Negative for depression. The patient is not nervous/anxious.       Past Medical History:  Diagnosis Date  . Alcohol-induced chronic pancreatitis ([place])   . B12 deficiency   . Concussion   . DDD (degenerative disc disease), cervical   . Diabetes ([place])   . DKA, type 2 ([place])   . Hypertension   . Neuropathy   . Seasonal allergies     reports that she has never smoked. She has never used smokeless tobacco. She reports previous alcohol use of about 7.0 standard drinks of alcohol per week. She reports that she does not use drugs.   Current Outpatient Medications:  .  Blood Glucose Monitoring Suppl (ONE TOUCH ULTRA 2) w/Device KIT,  Use to check blood sugars two times day, Disp: 1 each, Rfl: 0 .  dapagliflozin propanediol (FARXIGA) 5 MG TABS tablet, Take 5 mg by mouth daily., Disp: 30 tablet, Rfl: 1 .  diclofenac (VOLTAREN) 75 MG EC tablet, Take 1 tablet (75 mg total) by mouth 2 (two) times daily., Disp: 30 tablet, Rfl: 0 .  fluticasone (FLONASE) 50 MCG/ACT nasal spray, Place 2  sprays into both nostrils every morning., Disp: , Rfl:  .  glucose blood (ONE TOUCH ULTRA TEST) test strip, CHECK BLOOD SUGAR TWO TIMES DAILY AS DIRECTED, Disp: 200 each, Rfl: 3 .  insulin aspart (NOVOLOG FLEXPEN) 100 UNIT/ML FlexPen, Inject 4 Units into the skin 3 (three) times daily with meals., Disp: 15 mL, Rfl: 11 .  Insulin Degludec (TRESIBA FLEXTOUCH) 200 UNIT/ML SOPN, Inject 28 Units into the skin every morning., Disp: 15 mL, Rfl: 11 .  Insulin Pen Needle (PEN NEEDLES) 31G X 5 MM MISC, 1 application by Does not apply route 4 (four) times daily., Disp: 150 each, Rfl: 11 .  Lancets (ONETOUCH ULTRASOFT) lancets, Use to check blood sugar two times a day., Disp: 200 each, Rfl: 3 .  levocetirizine (XYZAL) 5 MG tablet, Take 5 mg by mouth at bedtime., Disp: , Rfl:  .  lisinopril (PRINIVIL,ZESTRIL) 40 MG tablet, Take 1 tablet (40 mg total) by mouth daily., Disp: 90 tablet, Rfl: 3 .  metoprolol tartrate (LOPRESSOR) 50 MG tablet, TAKE 1 TABLET BY MOUTH TWICE A DAY, Disp: 180 tablet, Rfl: 1 .  Multiple Vitamin (MULTIVITAMIN WITH MINERALS) TABS tablet, Take 1 tablet by mouth daily., Disp: , Rfl:  .  ondansetron (ZOFRAN) 4 MG tablet, Take 1 tablet (4 mg total) by mouth every 8 (eight) hours as needed for nausea or vomiting., Disp: 20 tablet, Rfl: 0 .  pantoprazole (PROTONIX) 40 MG tablet, Take 1 tablet (40 mg total) by mouth daily before breakfast., Disp: 90 tablet, Rfl: 0   Observations/Objective: Temperature 97.8 F (36.6 C), temperature source Oral, weight 163 lb (73.9 kg).  Physical Exam  Physical Exam Constitutional:      General: The patient is not in acute distress. Pulmonary:     Effort: Pulmonary effort is normal. No respiratory distress.  Neurological:     Mental Status: The patient is alert and oriented to person, place, and time.  Psychiatric:        Mood and Affect: Mood normal.        Behavior: Behavior normal.   Assessment and Plan The patient's preventative maintenance and  recommended screening tests for an annual wellness exam were reviewed in full today. Brought up to date unless services declined.  Counselled on the importance of diet, exercise, and its role in overall health and mortality. The patient's FH and SH was reviewed, including their home life, tobacco status, and drug and alcohol status.   Vaccines:  TD due this year. Mammo: 01/2018 PAP/DVE: 09/2015 nml neg HPV.Marland Kitchen Plan repeat in 5 years  no family hx of Ov or uterine cancer, asymptomatic. No menses at this point. Colon:  plan yearly IFOB Nonsmoker Hep C: done HIV neg   Type 2 diabetes mellitus with diabetic polyneuropathy, with long-term current use of insulin ([place]) Significant improvement. Followed by ENDO.  Right shoulder pain Not improving.. pt will call to set up appt with SM Dr. Lorelei Pont  Neuropathic pain of both legs Recent flare up.. may restart gabapentin prn.  Gastric outlet obstruction Nausea and GI symptoms resolved.  Eating well... no malnutrition.  Essential  hypertension, benign Well controlled. Continue current medication.   Chronic alcoholic pancreatitis ([facility]) Resolved GI symptoms.  NO ETOH use.   I discussed the assessment and treatment plan with the patient. The patient was provided an opportunity to ask questions and all were answered. The patient agreed with the plan and demonstrated an understanding of the instructions.   The patient was advised to call back or seek an in-person evaluation if the symptoms worsen or if the condition fails to improve as anticipated.    Wt Readings from Last 3 Encounters:  10/24/18 163 lb (73.9 kg)  09/19/18 163 lb 8 oz (74.2 kg)  09/02/18 151 lb (68.5 kg)     Eliezer Lofts, MD

## 2018-10-24 NOTE — Assessment & Plan Note (Signed)
Significant improvement. Followed by ENDO.

## 2018-10-24 NOTE — Assessment & Plan Note (Signed)
Nausea and GI symptoms resolved.  Eating well... no malnutrition.

## 2018-10-24 NOTE — Assessment & Plan Note (Signed)
Resolved GI symptoms.  NO ETOH use.

## 2018-10-28 ENCOUNTER — Telehealth: Payer: Self-pay | Admitting: Gastroenterology

## 2018-10-28 ENCOUNTER — Other Ambulatory Visit: Payer: Self-pay

## 2018-10-28 ENCOUNTER — Emergency Department (HOSPITAL_COMMUNITY)
Admission: EM | Admit: 2018-10-28 | Discharge: 2018-10-28 | Disposition: A | Discharge status: Home / Self Care | Payer: 59 | Attending: Emergency Medicine | Admitting: Emergency Medicine

## 2018-10-28 ENCOUNTER — Telehealth: Payer: Self-pay | Admitting: Internal Medicine

## 2018-10-28 ENCOUNTER — Emergency Department (HOSPITAL_COMMUNITY): Payer: 59

## 2018-10-28 ENCOUNTER — Telehealth: Payer: Self-pay

## 2018-10-28 DIAGNOSIS — E1142 Type 2 diabetes mellitus with diabetic polyneuropathy: Secondary | ICD-10-CM | POA: Diagnosis not present

## 2018-10-28 DIAGNOSIS — K859 Acute pancreatitis without necrosis or infection, unspecified: Secondary | ICD-10-CM | POA: Diagnosis not present

## 2018-10-28 DIAGNOSIS — Z79899 Other long term (current) drug therapy: Secondary | ICD-10-CM | POA: Diagnosis not present

## 2018-10-28 DIAGNOSIS — I10 Essential (primary) hypertension: Secondary | ICD-10-CM | POA: Insufficient documentation

## 2018-10-28 DIAGNOSIS — Z794 Long term (current) use of insulin: Secondary | ICD-10-CM | POA: Diagnosis not present

## 2018-10-28 DIAGNOSIS — R109 Unspecified abdominal pain: Secondary | ICD-10-CM | POA: Diagnosis present

## 2018-10-28 LAB — COMPREHENSIVE METABOLIC PANEL
ALT: 44 U/L (ref 0–44)
AST: 76 U/L — ABNORMAL HIGH (ref 15–41)
Albumin: 4.2 g/dL (ref 3.5–5.0)
Alkaline Phosphatase: 101 U/L (ref 38–126)
Anion gap: 15 (ref 5–15)
BUN: 18 mg/dL (ref 6–20)
CO2: 24 mmol/L (ref 22–32)
Calcium: 9.6 mg/dL (ref 8.9–10.3)
Chloride: 99 mmol/L (ref 98–111)
Creatinine, Ser: 1.28 mg/dL — ABNORMAL HIGH (ref 0.44–1.00)
GFR calc Af Amer: 54 mL/min — ABNORMAL LOW (ref >60)
GFR calc non Af Amer: 47 mL/min — ABNORMAL LOW (ref >60)
Glucose, Bld: 188 mg/dL — ABNORMAL HIGH (ref 70–99)
Potassium: 4.3 mmol/L (ref 3.5–5.1)
Sodium: 138 mmol/L (ref 135–145)
Total Bilirubin: 1 mg/dL (ref 0.3–1.2)
Total Protein: 7.3 g/dL (ref 6.5–8.1)

## 2018-10-28 LAB — CBC
HCT: 41.6 % (ref 36.0–46.0)
Hemoglobin: 13.9 g/dL (ref 12.0–15.0)
MCH: 29.3 pg (ref 26.0–34.0)
MCHC: 33.4 g/dL (ref 30.0–36.0)
MCV: 87.6 fL (ref 80.0–100.0)
Platelets: 164 10*3/uL (ref 150–400)
RBC: 4.75 MIL/uL (ref 3.87–5.11)
RDW: 12.5 % (ref 11.5–15.5)
WBC: 7.5 10*3/uL (ref 4.0–10.5)
nRBC: 0 % (ref 0.0–0.2)

## 2018-10-28 LAB — URINALYSIS, ROUTINE W REFLEX MICROSCOPIC
Bilirubin Urine: NEGATIVE
Glucose, UA: >=500 mg/dL — AB
Hgb urine dipstick: NEGATIVE
Ketones, ur: 20 mg/dL — AB
Leukocytes,Ua: NEGATIVE
Nitrite: NEGATIVE
Protein, ur: NEGATIVE mg/dL
Specific Gravity, Urine: 1.027 (ref 1.005–1.030)
pH: 5 (ref 5.0–8.0)

## 2018-10-28 LAB — LIPASE, BLOOD: Lipase: 101 U/L — ABNORMAL HIGH (ref 11–51)

## 2018-10-28 MED ORDER — ONDANSETRON 4 MG PO TBDP: ORAL_TABLET | ORAL | 0 refills | Status: DC

## 2018-10-28 MED ORDER — ONDANSETRON HCL 4 MG/2ML IJ SOLN
4.0000 mg | Freq: Once | INTRAMUSCULAR | Status: AC
  Administered 2018-10-28: 4 mg via INTRAVENOUS
  Filled 2018-10-28: qty 2

## 2018-10-28 MED ORDER — FENTANYL CITRATE (PF) 100 MCG/2ML IJ SOLN
50.0000 ug | Freq: Once | INTRAMUSCULAR | Status: AC
  Administered 2018-10-28: 50 ug via INTRAVENOUS
  Filled 2018-10-28: qty 2

## 2018-10-28 MED ORDER — SODIUM CHLORIDE 0.9 % IV BOLUS
1000.0000 mL | Freq: Once | INTRAVENOUS | Status: AC
  Administered 2018-10-28: 1000 mL via INTRAVENOUS

## 2018-10-28 MED ORDER — IOHEXOL 300 MG/ML  SOLN
100.0000 mL | Freq: Once | INTRAMUSCULAR | Status: AC | PRN
  Administered 2018-10-28: 100 mL via INTRAVENOUS

## 2018-10-28 MED ORDER — PANTOPRAZOLE SODIUM 40 MG IV SOLR
40.0000 mg | Freq: Once | INTRAVENOUS | Status: AC
  Administered 2018-10-28: 40 mg via INTRAVENOUS
  Filled 2018-10-28: qty 40

## 2018-10-28 MED ORDER — SODIUM CHLORIDE 0.9% FLUSH: 3.0000 mL | Freq: Once | INTRAVENOUS | Status: DC

## 2018-10-28 MED ORDER — HYDROCODONE-ACETAMINOPHEN 5-325 MG PO TABS: 1.0000 | ORAL_TABLET | ORAL | 0 refills | Status: DC | PRN

## 2018-10-28 NOTE — Telephone Encounter (Signed)
Shirlean Mylar called pt to verify in office appt on 10/29/18 with Dr Lorelei Pont. Pt has been vomiting since 10/25/18 on and off. Pt had vomiting on 10/25/18 and 10/26/18 in AM; no vomiting on 10/27/18 and started vomiting again today. Pt has appt with GI on 11/05/18 for FU pancreatitis . Pt also has H/A,chills,abd muscular pain from vomiting. No diarrhea,fever,cough,S/T,SOB or loss of taste or smell.pt has had shoulder pain for wks; had virtual appt with Dr Diona Browner on 10/24/18 and advised if shoulder did not improve to schedule appt with Dr Dollene Cleveland agreed pt can contact Dr Carlean Purl about vomiting and I offered pt virtual appt for remaining symptoms. The inoffice visit on 10/29/18 would need to be rescheduled after other symptoms are gone. Pt voiced understanding. Pt wants to contact Dr Carlean Purl before scheduling virtual at [facility]. Pt will cb if needed and pt will cb to reschedule appt with Dr Lorelei Pont after other symptoms are gone. FYI to Dr Lorelei Pont.

## 2018-10-28 NOTE — ED Triage Notes (Signed)
Pt reports lower abd pain and bloating- that has been on going but had gotten better and then worse on Saturday. Pt reports multiple episodes of vomiting and diarrhea.

## 2018-10-28 NOTE — Telephone Encounter (Signed)
Patient in ED this evening with day of escalating abd pain and vomiting.  Reviewed your recent office note and EGD/EUS report from April hospital stay.  Volume depletion - given IVF and anti-emetics and reportedly felt well enough to go home.   CT (reviewed) shows marked duodenal inflammation. Seems most likely reactive to acute on chronic pancreatitis (ED doc reports recent EtOH)  Should be going home on zofran, small supply pain meds, clear liquid diet.  Please have nursing contact her in AM with your advice.   (? See if she can get recover at home on clears, PPI, zofran, pain meds, consider panc enzymes)  - HD

## 2018-10-28 NOTE — Telephone Encounter (Signed)
Patient called in wanting to speak with the nurse for advice. She stated that she has been throwing up non stop even with water. She stated last time she was doing that she ended up in the hospital so she wanting to speak with the nurse.

## 2018-10-28 NOTE — Telephone Encounter (Signed)
Spoke to the patient who reports on 6/20 she starting vomiting (several times) whatever she ingested that progressed to persistent wretching. 6/21 vomited x1, decrease in PO's other than sips of gatorade zero which she tolerated. 6/22 vomited x2, ate no solid foods but tolerated more gatorade zero. Today, the patient has vomited twice, with sudden waves of nausea that seems to disappear quickly. The patient called her PCP and told her that her GI symptoms along with at temp of 99.1 are COVID-19 symptoms. The patient denies weakness, dizziness, endorses a slight headache that has resolved, reports decreased urine output, slight heart palpitations and "very loud gurgling." The patient also stated on 6/22, she had 2-3 episodes of dark brown, watery diarrhea, no blood or mucous. No BM today. The patient is has T2DM, no alcohol ingestion, no new foods, no other sick contacts, started the medication Wilder Glade today. Please advise.

## 2018-10-28 NOTE — ED Provider Notes (Signed)
[facility] Provider Note   CSN: [id number] Arrival date & time: 10/28/18  1610    History   Chief Complaint Chief Complaint  Patient presents with  . Abdominal Pain    HPI [facility] is a 58 y.o. female.     Patient is a 58 year old female who presents with abdominal pain.  She has been admitted to the hospital twice recently for nausea vomiting upper abdominal pain.  She was diagnosed with pancreatitis and her symptoms were thought to be related to chronic pancreatitis.  She has had her gallbladder removed.  She states that 3 days ago she started having a recurrence of her nausea and vomiting.  She had multiple episodes of vomiting and was unable to keep anything down.  Yesterday she actually felt a little bit better but this morning she started vomiting again has not been able to keep anything down.  She has tenderness more across her right lower abdomen at this point which is different from her prior episodes.  She has had some chills but no fevers.  No urinary symptoms.  She is had some loose stools but no significant diarrhea.     Past Medical History:  Diagnosis Date  . Alcohol-induced chronic pancreatitis ([place])   . B12 deficiency   . Concussion   . DDD (degenerative disc disease), cervical   . Diabetes ([place])   . DKA, type 2 ([place])   . Hypertension   . Neuropathy   . Seasonal allergies     Patient Active Problem List   Diagnosis Date Noted  . Chronic alcoholic pancreatitis ([place]) 09/02/2018  . Type 2 diabetes mellitus with diabetic polyneuropathy, with long-term current use of insulin ([place]) 08/22/2018  . Dyslipidemia 08/22/2018  . Gastric outlet obstruction 08/12/2018  . Vertigo 12/26/2017  . Chronic pain 05/17/2017  . Right shoulder pain 09/17/2016  . Osteoarthritis of spine with radiculopathy, cervical region 09/17/2016  . B12 deficiency 10/21/2014  . Neuropathic pain of both legs 02/11/2014  . Pancreatic  pseudocyst/cyst 05/06/2013  . EXCESSIVE OR FREQUENT MENSTRUATION 05/10/2009  . HYPERCHOLESTEROLEMIA 11/03/2008  . Essential hypertension, benign 09/29/2008  . PAP SMEAR, ABNORMAL 09/29/2008    Past Surgical History:  Procedure Laterality Date  . CHOLECYSTECTOMY N/A 07/28/2016   Procedure: LAPAROSCOPIC CHOLECYSTECTOMY WITH INTRAOPERATIVE CHOLANGIOGRAM;  Surgeon: Mickeal Skinner, MD;  Location: [facility];  Service: General;  Laterality: N/A;  . ESOPHAGOGASTRODUODENOSCOPY (EGD) WITH PROPOFOL N/A 08/13/2018   Procedure: ESOPHAGOGASTRODUODENOSCOPY (EGD) WITH PROPOFOL;  Surgeon: Irving Copas., MD;  Location: [facility];  Service: Gastroenterology;  Laterality: N/A;  . LEEP  1990's  . ORIF FOOT FRACTURE  09/2008   L 5th metatarsal  . REFRACTIVE SURGERY  2000  . TONSILLECTOMY    . UPPER ESOPHAGEAL ENDOSCOPIC ULTRASOUND (EUS) N/A 08/13/2018   Procedure: UPPER ESOPHAGEAL ENDOSCOPIC ULTRASOUND (EUS);  Surgeon: Irving Copas., MD;  Location: [facility];  Service: Gastroenterology;  Laterality: N/A;     OB History   No obstetric history on file.      Home Medications    Prior to Admission medications   Medication Sig Start Date End Date Taking? Authorizing Provider  Blood Glucose Monitoring Suppl (ONE TOUCH ULTRA 2) w/Device KIT Use to check blood sugars two times day 08/01/18   Eliezer Lofts E, MD  dapagliflozin propanediol (FARXIGA) 5 MG TABS tablet Take 5 mg by mouth daily. 10/03/18   Shamleffer,  Crazier, MD  diclofenac (VOLTAREN) 75 MG EC tablet Take 1 tablet (  75 mg total) by mouth 2 (two) times daily. 09/19/18   Bedsole, Amy E, MD  fluticasone (FLONASE) 50 MCG/ACT nasal spray Place 2 sprays into both nostrils every morning.    [provider]  glucose blood (ONE TOUCH ULTRA TEST) test strip CHECK BLOOD SUGAR TWO TIMES DAILY AS DIRECTED 08/01/18   Bedsole, Amy E, MD  HYDROcodone-acetaminophen (NORCO/VICODIN) 5-325 MG tablet Take 1-2 tablets by mouth every 4  (four) hours as needed. 10/28/18   Malvin Johns, MD  insulin aspart (NOVOLOG FLEXPEN) 100 UNIT/ML FlexPen Inject 4 Units into the skin 3 (three) times daily with meals. 10/03/18   Shamleffer, Aleah Ahlgrim Crazier, MD  Insulin Degludec (TRESIBA FLEXTOUCH) 200 UNIT/ML SOPN Inject 28 Units into the skin every morning. 09/04/18   Shamleffer, Kennet Mccort Crazier, MD  Insulin Pen Needle (PEN NEEDLES) 31G X 5 MM MISC 1 application by Does not apply route 4 (four) times daily. 08/22/18   Shamleffer, Riva Sesma Crazier, MD  Lancets [facility] ULTRASOFT) lancets Use to check blood sugar two times a day. 08/01/18   Bedsole, Amy E, MD  levocetirizine (XYZAL) 5 MG tablet Take 5 mg by mouth at bedtime.    [provider]  lisinopril (PRINIVIL,ZESTRIL) 40 MG tablet Take 1 tablet (40 mg total) by mouth daily. 12/26/17   Bedsole, Amy E, MD  metoprolol tartrate (LOPRESSOR) 50 MG tablet TAKE 1 TABLET BY MOUTH TWICE A DAY 09/22/18   Bedsole, Amy E, MD  Multiple Vitamin (MULTIVITAMIN WITH MINERALS) TABS tablet Take 1 tablet by mouth daily. 07/25/18   Mikhail, Velta Addison, DO  ondansetron (ZOFRAN ODT) 4 MG disintegrating tablet '4mg'$  ODT q4 hours prn nausea/vomit 10/28/18   Malvin Johns, MD  ondansetron (ZOFRAN) 4 MG tablet Take 1 tablet (4 mg total) by mouth every 8 (eight) hours as needed for nausea or vomiting. 08/01/18   Diona Browner, Amy E, MD  pantoprazole (PROTONIX) 40 MG tablet Take 1 tablet (40 mg total) by mouth daily before breakfast. 09/02/18   Gatha Mayer, MD    Family History Family History  Problem Relation Age of Onset  . Other Mother        tachycardia.Marland KitchenMarland Kitchen?afib  . Stroke Father        after hernia suegery  . Prostate cancer Father   . Other Father        global transient amnesia, unclear source  . Atrial fibrillation Sister   . Healthy Brother   . Healthy Brother   . Coronary artery disease Paternal Grandmother   . Heart attack Paternal Grandmother 29  . Brain cancer Maternal Grandfather        ?  Marland Kitchen Cancer  Paternal Grandfather        ?  Marland Kitchen Breast cancer Maternal Grandmother     Social History Social History   Tobacco Use  . Smoking status: Never Smoker  . Smokeless tobacco: Never Used  Substance Use Topics  . Alcohol use: Not Currently    Alcohol/week: 7.0 standard drinks    Types: 7 Glasses of wine per week    Comment: wine  . Drug use: No     Allergies   Gluten meal and Simvastatin   Review of Systems Review of Systems  Constitutional: Positive for chills. Negative for diaphoresis, fatigue and fever.  HENT: Negative for congestion, rhinorrhea and sneezing.   Eyes: Negative.   Respiratory: Negative for cough, chest tightness and shortness of breath.   Cardiovascular: Negative for chest pain and leg swelling.  Gastrointestinal: Positive for abdominal pain,  nausea and vomiting. Negative for blood in stool and diarrhea.  Genitourinary: Negative for difficulty urinating, flank pain, frequency and hematuria.  Musculoskeletal: Negative for arthralgias and back pain.  Skin: Negative for rash.  Neurological: Negative for dizziness, speech difficulty, weakness, numbness and headaches.     Physical Exam Updated Vital Signs BP (!) 155/85 (BP Location: Right Arm)   Pulse 82   Temp 98.6 F (37 C)   Resp 16   SpO2 99%   Physical Exam Constitutional:      Appearance: She is well-developed.  HENT:     Head: Normocephalic and atraumatic.  Eyes:     Pupils: Pupils are equal, round, and reactive to light.  Neck:     Musculoskeletal: Normal range of motion and neck supple.  Cardiovascular:     Rate and Rhythm: Normal rate and regular rhythm.     Heart sounds: Normal heart sounds.  Pulmonary:     Effort: Pulmonary effort is normal. No respiratory distress.     Breath sounds: Normal breath sounds. No wheezing or rales.  Chest:     Chest wall: No tenderness.  Abdominal:     General: Bowel sounds are normal.     Palpations: Abdomen is soft.     Tenderness: There is abdominal  tenderness in the right upper quadrant and right lower quadrant. There is no guarding or rebound.  Musculoskeletal: Normal range of motion.  Lymphadenopathy:     Cervical: No cervical adenopathy.  Skin:    General: Skin is warm and dry.     Findings: No rash.  Neurological:     Mental Status: She is alert and oriented to person, place, and time.      ED Treatments / Results  Labs (all labs ordered are listed, but only abnormal results are displayed) Labs Reviewed  LIPASE, BLOOD - Abnormal; Notable for the following components:      Result Value   Lipase 101 (*)    All other components within normal limits  COMPREHENSIVE METABOLIC PANEL - Abnormal; Notable for the following components:   Glucose, Bld 188 (*)    Creatinine, Ser 1.28 (*)    AST 76 (*)    GFR calc non Af Amer 47 (*)    GFR calc Af Amer 54 (*)    All other components within normal limits  URINALYSIS, ROUTINE W REFLEX MICROSCOPIC - Abnormal; Notable for the following components:   Glucose, UA >=500 (*)    Ketones, ur 20 (*)    Bacteria, UA RARE (*)    All other components within normal limits  CBC    EKG None  Radiology Ct Abdomen Pelvis W Contrast  Result Date: 10/28/2018 CLINICAL DATA:  58 year old female with history of lower abdominal pain and bloating. Multiple episodes of vomiting and diarrhea. EXAM: CT ABDOMEN AND PELVIS WITH CONTRAST TECHNIQUE: Multidetector CT imaging of the abdomen and pelvis was performed using the standard protocol following bolus administration of intravenous contrast. CONTRAST:  169m OMNIPAQUE IOHEXOL 300 MG/ML  SOLN COMPARISON:  CT the abdomen and pelvis 10/01/2018. FINDINGS: Lower chest: Aortic atherosclerosis. Hepatobiliary: No suspicious cystic or solid hepatic lesions. No intra or extrahepatic biliary ductal dilatation. Status post cholecystectomy. Pancreas: No pancreatic mass. No pancreatic ductal dilatation. Mild pancreatic atrophy. Multiple small coarse calcifications are  noted in the head of the pancreas. Inflammatory changes adjacent to the head of the pancreas, favored to be related to the adjacent duodenal process (discussed below). Spleen: Unremarkable. Adrenals/Urinary Tract: Bilateral kidneys and  adrenal glands are normal in appearance. No hydroureteronephrosis. Urinary bladder is normal in appearance. Stomach/Bowel: Normal appearance of the stomach. Severe mural thickening and mucosal hyperenhancement in the duodenum, most pronounced in the region of the duodenal bulb, second and proximal third portion of the duodenum. In addition, there is an unusual low-attenuation region along the medial aspect of the duodenal bulb and proximal second portion of the duodenum which is irregular in shape but estimated to measure approximately 2.2 x 0.9 x 2.5 cm (axial image 27 of series 3 and coronal image 41 of series 6). No pathologic dilatation of small bowel or colon. Scattered colonic diverticulae are noted, without surrounding inflammatory changes to suggest an acute diverticulitis at this time. Normal appendix. Vascular/Lymphatic: Aortic atherosclerosis, without evidence of aneurysm or dissection in the abdominal or pelvic vasculature. Several prominent but nonenlarged periduodenal and peripancreatic lymph nodes are noted, likely reactive. No lymphadenopathy noted in the abdomen or pelvis. Reproductive: In the left side of the uterus there is a 1.3 cm hypervascular lesion, presumably a small fibroid. Ovaries are unremarkable in appearance. Other: No significant volume of ascites.  No pneumoperitoneum. Musculoskeletal: There are no aggressive appearing lytic or blastic lesions noted in the visualized portions of the skeleton. IMPRESSION: 1. Profound mural thickening, mucosal hyperenhancement and surrounding inflammation associated with the proximal to mid duodenum, as detailed above. Importantly, there is also a somewhat ill-defined low-attenuation collection along the medial aspect of  the duodenal bulb and proximal second segment of the duodenum. Findings are compatible with duodenitis, but the collection raises concern for potential duodenal ulcer with fistulous tract. Alternatively but less likely, findings could be related to "groove pancreatitis". Correlation with endoscopy should be considered if clinically appropriate. 2. Aortic atherosclerosis. 3. Additional incidental findings, as above. Electronically Signed   By: Vinnie Langton M.D.   On: 10/28/2018 20:40    Procedures Procedures (including critical care time)  Medications Ordered in ED Medications  sodium chloride flush (NS) 0.9 % injection 3 mL (3 mLs Intravenous Not Given 10/28/18 2127)  sodium chloride 0.9 % bolus 1,000 mL (0 mLs Intravenous Stopped 10/28/18 2128)  ondansetron (ZOFRAN) injection 4 mg (4 mg Intravenous Given 10/28/18 1907)  fentaNYL (SUBLIMAZE) injection 50 mcg (50 mcg Intravenous Given 10/28/18 1901)  pantoprazole (PROTONIX) injection 40 mg (40 mg Intravenous Given 10/28/18 1907)  iohexol (OMNIPAQUE) 300 MG/ML solution 100 mL (100 mLs Intravenous Contrast Given 10/28/18 1949)     Initial Impression / Assessment and Plan / ED Course  I have reviewed the triage vital signs and the nursing notes.  Pertinent labs & imaging results that were available during my care of the patient were reviewed by me and considered in my medical decision making (see chart for details).        Patient is a 58 year old female who presents with abdominal pain.  She has a history of prior pancreatitis.  There was some questionable duodenal ulcer disease and she is on a PPI.  I did do a CT scan today because her symptoms seem to be a little worse/different in her prior episodes of pancreatitis.  She was tender in her right lower abdomen.  CT scan shows possible duodenal ulcer with questionable fistula tract.  I did discuss these findings with Dr. Loletha Carrow with [facility] gastroenterology.  He has reviewed the chart and images  and feels that it is most likely consistent with her prior EGD findings of pancreatitis.  She is feeling much better after treatment in the ED.  Her  lipase is mildly elevated.  Her LFTs are mildly elevated.  However she is symptomatically much better and has no ongoing vomiting.  She is able to tolerate oral fluids.  I did discharge her in good condition.  I encouraged her to use a clear liquid only diet.  She was given a prescription for a small amount of Vicodin and Zofran.  Dr. Loletha Carrow is going to send a message to Dr. Carlean Purl, the patient's gastroenterologist to arrange close follow-up.  Final Clinical Impressions(s) / ED Diagnoses   Final diagnoses:  Acute pancreatitis, unspecified complication status, unspecified pancreatitis type    ED Discharge Orders         Ordered    ondansetron (ZOFRAN ODT) 4 MG disintegrating tablet     10/28/18 2232    HYDROcodone-acetaminophen (NORCO/VICODIN) 5-325 MG tablet  Every 4 hours PRN     10/28/18 2232           Malvin Johns, MD 10/28/18 2236

## 2018-10-28 NOTE — ED Notes (Signed)
PO challenge started

## 2018-10-28 NOTE — Telephone Encounter (Signed)
Spoke to the patient and the patient reports continued vomiting and diarrhea since the end of our earlier phone call. The patient was not surprised by the suggestion to report to the ED for evaluation. The patient reported she would report to the ED for evaluation. Nothing further at the time of the phone call.

## 2018-10-28 NOTE — Telephone Encounter (Signed)
I agree

## 2018-10-28 NOTE — Telephone Encounter (Signed)
I think this could be a flare of her pancreatitis and she should go to the ED for evaluation, IV fluids and she could even need admission.  While it is possible Covid-19 could present this way it is probably not though she can get tested in the ED if needed.

## 2018-10-29 ENCOUNTER — Telehealth: Payer: Self-pay | Admitting: Internal Medicine

## 2018-10-29 ENCOUNTER — Ambulatory Visit: Payer: 59 | Admitting: Family Medicine

## 2018-10-29 NOTE — Telephone Encounter (Signed)
Pt was in the Ed yesterday and was put on a clear liquid diet until she f/u with Dr. Carlean Purl. She has an appt with him next Wednesday so she wants to know if she needs to be on a liquid diet until then or if she can eat something.

## 2018-10-29 NOTE — Telephone Encounter (Signed)
See phone note

## 2018-10-29 NOTE — Telephone Encounter (Signed)
Called patient, she has had no vomiting since yesterday,still has some abdominal pain and a lot of "gurgling". She has tolerated sprite, gator-aid and broth. she has not had anything solid since Mon. And feels weak  and has lost 5 lbs. She has not taken the Voltaren for 1 1/2 weeks. Asked her to increase her Protonix to twice a day. States her husband has gone to the Pharmacy to pick-up her Zofran ODT and Vicoden

## 2018-10-29 NOTE — Telephone Encounter (Signed)
Will have RN staff contact patient and get symptom update re pain, vomiting and what diet she is tolerating  She needs to stop voltaren for now and take pantoprazole twice a day before breakfast and supper  Once I hear how she is will make additional recommendations

## 2018-10-30 ENCOUNTER — Encounter: Payer: Self-pay | Admitting: Internal Medicine

## 2018-10-30 DIAGNOSIS — K2980 Duodenitis without bleeding: Secondary | ICD-10-CM | POA: Insufficient documentation

## 2018-10-30 NOTE — Telephone Encounter (Signed)
She is much better today.  One thought is could this be angioedema from lisinopril.  She has f/u 7/1

## 2018-11-04 ENCOUNTER — Telehealth: Payer: Self-pay

## 2018-11-04 NOTE — Telephone Encounter (Signed)
Covid-19 screening questions   Do you now or have you had a fever in the last 14 days no   Do you have any respiratory symptoms of shortness of breath or cough now or in the last 14 days no  Do you have any family members or close contacts with diagnosed or suspected Covid-19 in the past 14 days no  Have you been tested for Covid-19 and found to be positive no          

## 2018-11-05 ENCOUNTER — Ambulatory Visit: Payer: 59 | Admitting: Internal Medicine

## 2018-11-05 ENCOUNTER — Encounter: Payer: Self-pay | Admitting: Internal Medicine

## 2018-11-05 ENCOUNTER — Other Ambulatory Visit: Payer: Self-pay

## 2018-11-05 VITALS — BP 128/72 | HR 84 | Ht 69.0 in | Wt 164.0 lb

## 2018-11-05 DIAGNOSIS — K862 Cyst of pancreas: Secondary | ICD-10-CM

## 2018-11-05 DIAGNOSIS — E538 Deficiency of other specified B group vitamins: Secondary | ICD-10-CM | POA: Diagnosis not present

## 2018-11-05 DIAGNOSIS — K860 Alcohol-induced chronic pancreatitis: Secondary | ICD-10-CM

## 2018-11-05 DIAGNOSIS — K2980 Duodenitis without bleeding: Secondary | ICD-10-CM | POA: Diagnosis not present

## 2018-11-05 DIAGNOSIS — K863 Pseudocyst of pancreas: Secondary | ICD-10-CM

## 2018-11-05 MED ORDER — B-12 1000 MCG PO TBCR: 2000.0000 ug | EXTENDED_RELEASE_TABLET | Freq: Every day | ORAL | Status: DC

## 2018-11-05 NOTE — Patient Instructions (Addendum)
Glad you are feeling better.  I will communicate with Dr. Diona Browner about changing BP med.  Please start B12 2000 ug daily by mouth and will ask Dr. Lorelei Pont if you can get a B12 shot at their office tomorrow if possible.  Dr. Donneta Romberg team will contact you about another endoscopic ultrasound to be done in -8 weeks.  I appreciate the opportunity to care for you. Mallory Mayer, MD, [facility]

## 2018-11-05 NOTE — Assessment & Plan Note (Signed)
Follow-up EUS is planned

## 2018-11-05 NOTE — Assessment & Plan Note (Signed)
Question if this recent problem was a flare or something else perhaps she had a gastroenteritis.  Another thought I have had is whether or not she could have ACE inhibitor induced angioedema.  Plan for a follow-up endoscopic ultrasound in about 6 weeks.  We will coordinate with Dr. Rush Landmark.

## 2018-11-05 NOTE — Assessment & Plan Note (Signed)
Needs to start oral B12 and consider getting an injection.

## 2018-11-05 NOTE — Progress Notes (Signed)
Mallory Stewart 58 y.o. [birth year] [id number]  Assessment & Plan:   Chronic alcoholic pancreatitis [facility]) Question if this recent problem was a flare or something else perhaps she had a gastroenteritis.  Another thought I have had is whether or not she could have ACE inhibitor induced angioedema.  Plan for a follow-up endoscopic ultrasound in about 6 weeks.  We will coordinate with Dr. Rush Landmark.  Duodenitis - edema ? andgioedema I suppose this is possible but is not very clear.  Did could be reasonable to change her blood pressure medicine, it sounds like this was started for hypertension and not diabetic albuminuria.  That said it may be advantageous for her to be changed to an arm which does still have some risk though less that ACE inhibitor is, of causing angioedema.  Will defer to Dr. Diona Browner.  Pancreatic pseudocyst/cyst Follow-up EUS is planned  B12 deficiency Needs to start oral B12 and consider getting an injection.   CC: Mallory Sanders, MD  Subjective:   Chief Complaint: Pancreatitis?  HPI Mallory Stewart is here for follow-up after an episode of nausea vomiting and diarrhea which required an ER visit.  Her brother-in-law had been sick with a gastrointestinal problem and then she developed nausea vomiting diarrhea and some abdominal pain.  She said it felt different than her pancreatitis problems.  She was seen and evaluated in the emergency department on 623, CT scan showed market edema of the duodenum similar to what she had back in April.  Dr. Rush Landmark had performed an EUS that showed her chronic pancreatitis changes and a cystic lesion in the pancreas.  Follow-up CT imaging after that showed resolution and improvement.  She did have persistent thickening in the duodenum though was much less.  Previous gastric wall thickening was better.  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  She has been abstinent from alcohol.  Her lipase was 101 with top normal 51 on June 23.  She  was really only ill for a few days and bounced back quickly. Allergies  Allergen Reactions  . Gluten Meal Other (See Comments)    unk  . Simvastatin Other (See Comments)    Elevated lfts?   Current Meds  Medication Sig  . Blood Glucose Monitoring Suppl (ONE TOUCH ULTRA 2) w/Device KIT Use to check blood sugars two times day  . dapagliflozin propanediol (FARXIGA) 5 MG TABS tablet Take 5 mg by mouth daily.  . fluticasone (FLONASE) 50 MCG/ACT nasal spray Place 2 sprays into both nostrils every morning.  Marland Kitchen glucose blood (ONE TOUCH ULTRA TEST) test strip CHECK BLOOD SUGAR TWO TIMES DAILY AS DIRECTED  . HYDROcodone-acetaminophen (NORCO/VICODIN) 5-325 MG tablet Take 1-2 tablets by mouth every 4 (four) hours as needed.  . Insulin Degludec (TRESIBA FLEXTOUCH) 200 UNIT/ML SOPN Inject 28 Units into the skin every morning.  . Insulin Pen Needle (PEN NEEDLES) 31G X 5 MM MISC 1 application by Does not apply route 4 (four) times daily.  . Lancets (ONETOUCH ULTRASOFT) lancets Use to check blood sugar two times a day.  . levocetirizine (XYZAL) 5 MG tablet Take 5 mg by mouth at bedtime.  Marland Kitchen lisinopril (PRINIVIL,ZESTRIL) 40 MG tablet Take 1 tablet (40 mg total) by mouth daily.  . metoprolol tartrate (LOPRESSOR) 50 MG tablet TAKE 1 TABLET BY MOUTH TWICE A DAY  . Multiple Vitamin (MULTIVITAMIN WITH MINERALS) TABS tablet Take 1 tablet by mouth daily.  . ondansetron (ZOFRAN ODT) 4 MG disintegrating tablet '4mg'$  ODT q4 hours prn nausea/vomit  .  pantoprazole (PROTONIX) 40 MG tablet Take 1 tablet (40 mg total) by mouth daily before breakfast. (Patient taking differently: Take 40 mg by mouth 2 (two) times daily. )   Past Medical History:  Diagnosis Date  . Alcohol-induced chronic pancreatitis ([place])   . B12 deficiency   . Concussion   . DDD (degenerative disc disease), cervical   . Diabetes ([place])   . DKA, type 2 ([place])   . Hypertension   . Neuropathy   . Seasonal allergies    Past Surgical History:  Procedure  Laterality Date  . CHOLECYSTECTOMY N/A 07/28/2016   Procedure: LAPAROSCOPIC CHOLECYSTECTOMY WITH INTRAOPERATIVE CHOLANGIOGRAM;  Surgeon: Mickeal Skinner, MD;  Location: [place];  Service: General;  Laterality: N/A;  . ESOPHAGOGASTRODUODENOSCOPY (EGD) WITH PROPOFOL N/A 08/13/2018   Procedure: ESOPHAGOGASTRODUODENOSCOPY (EGD) WITH PROPOFOL;  Surgeon: Irving Copas., MD;  Location: [place];  Service: Gastroenterology;  Laterality: N/A;  . LEEP  1990's  . ORIF FOOT FRACTURE  09/2008   L 5th metatarsal  . REFRACTIVE SURGERY  2000  . TONSILLECTOMY    . 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   Review of Systems See HPI  Objective:   Physical Exam BP 128/72   Pulse 84   Ht '5\' 9"'$  (1.753 m)   Wt 164 lb (74.4 kg)   BMI 24.22 kg/m  No acute distress middle-aged white woman Eyes anicteric Abdomen is soft and nontender bowel sounds are present no organomegaly or mass Appropriate mood and affect Alert and oriented x3   Data reviewed include ER visit CT scan including images reviewed with the patient

## 2018-11-05 NOTE — Assessment & Plan Note (Signed)
I suppose this is possible but is not very clear.  Did could be reasonable to change her blood pressure medicine, it sounds like this was started for hypertension and not diabetic albuminuria.  That said it may be advantageous for her to be changed to an arm which does still have some risk though less that ACE inhibitor is, of causing angioedema.  Will defer to Dr. Diona Browner.

## 2018-11-06 ENCOUNTER — Ambulatory Visit: Payer: 59 | Admitting: Family Medicine

## 2018-11-06 ENCOUNTER — Other Ambulatory Visit: Payer: Self-pay

## 2018-11-06 ENCOUNTER — Ambulatory Visit (INDEPENDENT_AMBULATORY_CARE_PROVIDER_SITE_OTHER)
Admission: RE | Admit: 2018-11-06 | Discharge: 2018-11-06 | Disposition: A | Discharge status: Home / Self Care | Payer: 59 | Source: Ambulatory Visit | Attending: Family Medicine | Admitting: Family Medicine

## 2018-11-06 ENCOUNTER — Encounter: Payer: Self-pay | Admitting: Family Medicine

## 2018-11-06 VITALS — BP 120/86 | HR 73 | Temp 97.9°F | Ht 68.25 in | Wt 164.5 lb

## 2018-11-06 DIAGNOSIS — E538 Deficiency of other specified B group vitamins: Secondary | ICD-10-CM | POA: Diagnosis not present

## 2018-11-06 DIAGNOSIS — M25511 Pain in right shoulder: Secondary | ICD-10-CM | POA: Diagnosis not present

## 2018-11-06 MED ORDER — CYANOCOBALAMIN 1000 MCG/ML IJ SOLN
1000.0000 ug | Freq: Once | INTRAMUSCULAR | Status: AC
  Administered 2018-11-06: 1000 ug via INTRAMUSCULAR

## 2018-11-06 MED ORDER — METHYLPREDNISOLONE ACETATE 40 MG/ML IJ SUSP
80.0000 mg | Freq: Once | INTRAMUSCULAR | Status: AC
  Administered 2018-11-06: 80 mg via INTRA_ARTICULAR

## 2018-11-06 NOTE — Addendum Note (Signed)
Addended by: Carter Kitten on: 11/06/2018 09:48 AM   Modules accepted: Orders

## 2018-11-06 NOTE — Progress Notes (Addendum)
Mallory Rochon T. [name], MD Primary Care and [place] at [facility] [place], [address] Phone: [phone]  FAX: [place] - 58 y.o. female  MRN [id number]  Date of Birth: [birth year]  Visit Date: 11/06/2018  PCP: [name], MD  Referred by: [name], MD  Chief Complaint  Patient presents with  . Shoulder Pain    Right   Subjective:   Mallory Stewart is a 58 y.o. very pleasant female patient who presents with the following:  Patient presents with ongoing R shoulder pain and Dr. Carlean Purl asked me to assist with giving her a B12 shot.  Lab Results  Component Value Date   VITAMINB12 157 (L) 10/16/2018    B12 def  R shoulder has been bothering her for about six weeks. Will hurt with vaccuming and using a spatula.  Putting bra on is very painful.  Water aerobic will hurt. Flexion feels terrible.   Wakes up at night.  No injury.  Threw a frisbee to her dog.  ? Right shoulder pain since 6 weeks ago.   She recalls recalls having some pain significantly when she was throwing a Frisbee, and since that time she is not been able to do this.  Abrupt roughly onset of pain.  She is having pain particular with abduction and flexion.  She is also having some pain with internal range of motion.  No significant history of shoulder pain including no fracture, dislocation or surgery in the past.  subac inj  r bic groove Drop pain  Past Medical History, Surgical History, Social History, Family History, Problem List, Medications, and Allergies have been reviewed and updated if relevant.  Patient Active Problem List   Diagnosis Date Noted  . Duodenitis - edema ? andgioedema 10/30/2018  . Chronic alcoholic pancreatitis ([place]) 09/02/2018  . Type 2 diabetes mellitus with diabetic polyneuropathy, with long-term current use of insulin ([place]) 08/22/2018  . Dyslipidemia 08/22/2018  . Gastric outlet  obstruction 08/12/2018  . Vertigo 12/26/2017  . Chronic pain 05/17/2017  . Right shoulder pain 09/17/2016  . Osteoarthritis of spine with radiculopathy, cervical region 09/17/2016  . B12 deficiency 10/21/2014  . Neuropathic pain of both legs 02/11/2014  . Pancreatic pseudocyst/cyst 05/06/2013  . EXCESSIVE OR FREQUENT MENSTRUATION 05/10/2009  . HYPERCHOLESTEROLEMIA 11/03/2008  . Essential hypertension, benign 09/29/2008  . PAP SMEAR, ABNORMAL 09/29/2008    Past Medical History:  Diagnosis Date  . Alcohol-induced chronic pancreatitis ([place])   . B12 deficiency   . Concussion   . DDD (degenerative disc disease), cervical   . Diabetes ([place])   . DKA, type 2 ([place])   . Hypertension   . Neuropathy   . Seasonal allergies     Past Surgical History:  Procedure Laterality Date  . CHOLECYSTECTOMY N/A 07/28/2016   Procedure: LAPAROSCOPIC CHOLECYSTECTOMY WITH INTRAOPERATIVE CHOLANGIOGRAM;  Surgeon: Mickeal Skinner, MD;  Location: [place];  Service: General;  Laterality: N/A;  . ESOPHAGOGASTRODUODENOSCOPY (EGD) WITH PROPOFOL N/A 08/13/2018   Procedure: ESOPHAGOGASTRODUODENOSCOPY (EGD) WITH PROPOFOL;  Surgeon: Irving Copas., MD;  Location: [place];  Service: Gastroenterology;  Laterality: N/A;  . LEEP  1990's  . ORIF FOOT FRACTURE  09/2008   L 5th metatarsal  . REFRACTIVE SURGERY  2000  . TONSILLECTOMY    . UPPER ESOPHAGEAL ENDOSCOPIC ULTRASOUND (EUS) N/A 08/13/2018   Procedure: UPPER ESOPHAGEAL ENDOSCOPIC ULTRASOUND (EUS);  Surgeon: Rush Landmark Telford Nab., MD;  [facility]  ENDOSCOPY;  Service: Gastroenterology;  Laterality: N/A;    Social History   Socioeconomic History  . Marital status: Married    Spouse name: Not on file  . Number of children: 1  . Years of education: Not on file  . Highest education level: Not on file  Occupational History  . Occupation: Mudlogger of business development  Social Needs  . Financial resource strain: Not on file  . Food insecurity     Worry: Not on file    Inability: Not on file  . Transportation needs    Medical: Not on file    Non-medical: Not on file  Tobacco Use  . Smoking status: Never Smoker  . Smokeless tobacco: Never Used  Substance and Sexual Activity  . Alcohol use: Not Currently    Alcohol/week: 7.0 standard drinks    Types: 7 Glasses of wine per week    Comment: wine  . Drug use: No  . Sexual activity: Not on file  Lifestyle  . Physical activity    Days per week: Not on file    Minutes per session: Not on file  . Stress: Not on file  Relationships  . Social Herbalist on phone: Not on file    Gets together: Not on file    Attends religious service: Not on file    Active member of club or organization: Not on file    Attends meetings of clubs or organizations: Not on file    Relationship status: Not on file  . Intimate partner violence    Fear of current or ex partner: Not on file    Emotionally abused: Not on file    Physically abused: Not on file    Forced sexual activity: Not on file  Other Topics Concern  . 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 Other Mother        tachycardia.Marland KitchenMarland Kitchen?afib  . Stroke Father        after hernia suegery  . Prostate cancer Father   . Other Father        global transient amnesia, unclear source  . Atrial fibrillation Sister   . Healthy Brother   . Healthy Brother   . Coronary artery disease Paternal Grandmother   . Heart attack Paternal Grandmother 48  . Brain cancer Maternal Grandfather        ?  Marland Kitchen Cancer Paternal Grandfather        ?  Marland Kitchen Breast cancer Maternal  Grandmother     Allergies  Allergen Reactions  . Gluten Meal Other (See Comments)    unk  . Simvastatin Other (See Comments)    Elevated lfts?    Medication list reviewed and updated in full in [place].  GEN: No fevers, chills. Nontoxic. Primarily MSK c/o today. MSK: Detailed in the HPI GI: tolerating PO intake without difficulty Neuro: No numbness, parasthesias, or tingling associated. Otherwise the pertinent positives of the ROS are noted above.   Objective:   BP 120/86  Pulse 73   Temp 97.9 F (36.6 C) (Temporal)   Ht 5' 8.25" (1.734 m)   Wt 164 lb 8 oz (74.6 kg)   SpO2 99%   BMI 24.83 kg/m    GEN: Well-developed,well-nourished,in no acute distress; alert,appropriate and cooperative throughout examination HEENT: Normocephalic and atraumatic without obvious abnormalities. Ears, externally no deformities PULM: Breathing comfortably in no respiratory distress EXT: No clubbing, cyanosis, or edema PSYCH: Normally interactive. Cooperative during the interview. Pleasant. Friendly and conversant. Not anxious or depressed appearing. Normal, full affect.  Shoulder: R Inspection: No muscle wasting or winging Ecchymosis/edema: neg  AC joint, scapula, clavicle: NT Cervical spine: NT, full ROM Spurling's: neg Abduction: full, 4+/5, painful arc Flexion: full, 5/5, painful IR, full, lift-off: 5/5 ER at neutral: full, 5/5 AC crossover: neg Neer: pos Hawkins: pos Drop Test: pain Empty Can: pos Supraspinatus insertion: mild-mod T Bicipital groove: ttp Speed's: neg Yergason's: neg Sulcus sign: neg Scapular dyskinesis: yes C5-T1 intact  Neuro: Sensation intact Grip 5/5   Radiology:  Assessment and Plan:     ICD-10-CM   1. Acute pain of right shoulder  M25.511 DG Shoulder Right    Ambulatory referral to Physical Therapy  2. B12 deficiency  E53.8    Partial-thickness rotator cuff tear, supraspinatus is most likely cause of symptoms.  Initiate formal  physical therapy for rehab.  Significant pain right now.  Some degree of functional impairment.  Previous conservative care has failed, subacromial injection today.  Aspiration/Injection Procedure Note NATALYAH CUMMISKEY [birth year] Date of procedure: 11/06/2018  Procedure: Large Joint Aspiration / Injection of Shoulder, Subacromial, R Indications: Pain  Procedure Details Verbal consent was obtained from the patient. Risks (including rare infection), benefits, and alternatives were explained. Patient prepped with Chloraprep and Ethyl Chloride used for anesthesia. The subacromial space was injected using the posterior approach. The patient tolerated the procedure well and had decreased pain post injection. No complications. Injection: 8 cc of Lidocaine 1% and 2 mL of Depo-Medrol 40 mg. Needle: 22 gauge Medication: 2 mL of Depo-Medrol 40 mg, equaling Depo-Medrol 80 mg total   ADDENDUM, 11/23/2018: I spoke with the patient as well as the physical therapist who is been taking care of her, and she has been doing worse in physical therapy.  Given that the patient clinically has a partial-thickness versus full-thickness rotator cuff tear and is failed outpatient management, we will obtain an MRI of the right shoulder to evaluate for full-thickness rotator cuff tear.  This is without contrast. Electronically Signed  By: Owens Loffler, MD On: 11/23/2018 6:07 PM   Follow-up: 6 weeks  No orders of the defined types were placed in this encounter.  Orders Placed This Encounter  Procedures  . DG Shoulder Right  . Ambulatory referral to Physical Therapy    Signed,  Frederico Hamman T. Lurline Caver, MD   Outpatient Encounter Medications as of 11/06/2018  Medication Sig  . Blood Glucose Monitoring Suppl (ONE TOUCH ULTRA 2) w/Device KIT Use to check blood sugars two times day  . Cyanocobalamin (B-12) 1000 MCG TBCR Take 2,000 mcg by mouth daily.  . dapagliflozin propanediol (FARXIGA) 5 MG TABS tablet Take 5 mg  by mouth daily.  . fluticasone (FLONASE) 50 MCG/ACT nasal spray Place 2 sprays into both nostrils every morning.  Marland Kitchen glucose blood (ONE TOUCH ULTRA TEST) test strip CHECK BLOOD SUGAR TWO TIMES DAILY AS DIRECTED  . HYDROcodone-acetaminophen (NORCO/VICODIN) 5-325 MG tablet Take 1-2 tablets by mouth every 4 (four) hours as needed.  . Insulin Degludec (  TRESIBA FLEXTOUCH) 200 UNIT/ML SOPN Inject 28 Units into the skin every morning.  . Insulin Pen Needle (PEN NEEDLES) 31G X 5 MM MISC 1 application by Does not apply route 4 (four) times daily.  . Lancets (ONETOUCH ULTRASOFT) lancets Use to check blood sugar two times a day.  . levocetirizine (XYZAL) 5 MG tablet Take 5 mg by mouth at bedtime.  Marland Kitchen lisinopril (PRINIVIL,ZESTRIL) 40 MG tablet Take 1 tablet (40 mg total) by mouth daily.  . metoprolol tartrate (LOPRESSOR) 50 MG tablet TAKE 1 TABLET BY MOUTH TWICE A DAY  . Multiple Vitamin (MULTIVITAMIN WITH MINERALS) TABS tablet Take 1 tablet by mouth daily.  . ondansetron (ZOFRAN ODT) 4 MG disintegrating tablet '4mg'$  ODT q4 hours prn nausea/vomit  . pantoprazole (PROTONIX) 40 MG tablet Take 40 mg by mouth 2 (two) times daily.  . [DISCONTINUED] pantoprazole (PROTONIX) 40 MG tablet Take 1 tablet (40 mg total) by mouth daily before breakfast. (Patient taking differently: Take 40 mg by mouth 2 (two) times daily. )   No facility-administered encounter medications on file as of 11/06/2018.

## 2018-11-18 ENCOUNTER — Telehealth: Payer: Self-pay | Admitting: Family Medicine

## 2018-11-18 NOTE — Telephone Encounter (Signed)
I do not have the results... 

## 2018-11-18 NOTE — Telephone Encounter (Signed)
Patient stated she had a COVID antibody test done yesterday.   She stated that the results are in, but she is having difficulties reviewing her results through [facility], She would like to know if maybe these have been sent to you or if there is someone who could call her to help her find these results.   C/B # [phone]

## 2018-11-18 NOTE — Telephone Encounter (Signed)
Spoke with pt gave notes below advised pt to contact [facility]

## 2018-11-20 ENCOUNTER — Telehealth: Payer: Self-pay | Admitting: Internal Medicine

## 2018-11-20 NOTE — Telephone Encounter (Signed)
Patient is experiencing consistently higher blood sugars the last week.  Blood Sugars has been in the 300's and this morning it was over 400   Would like to know how to proceed?  Call back number is [phone]

## 2018-11-20 NOTE — Telephone Encounter (Signed)
Called pt and got 1 weeks worth of blood sugars. They are as follows: 07/16 was 400 07/15 was 221  07/14 was 300 07/13 was 320 07/12 was 350  07/11 was 270  07/10 was 371 All values were fasting and medications were verified and accurate on pt's chart.

## 2018-11-20 NOTE — Telephone Encounter (Signed)
Okay to increase Tresiba from 28 to 32 units and then may increase further to 34 if needed.  However, if sugars are not improved, she may need to add back NovoLog initially at 4 units 3 times a day.

## 2018-11-21 ENCOUNTER — Telehealth: Payer: Self-pay | Admitting: Family Medicine

## 2018-11-21 NOTE — Telephone Encounter (Signed)
Pt called stating that Dr. Carney Harder said they don't think PT will fix her problem. She is wondering if she can get an MRI or if there are next steps. She can be reached at [phone].

## 2018-11-21 NOTE — Telephone Encounter (Signed)
Called pt and left detailed voicemail with MD instructions.

## 2018-11-23 NOTE — Telephone Encounter (Signed)
Please call  I ordered an MRI

## 2018-11-23 NOTE — Addendum Note (Signed)
Addended by: Owens Loffler on: 11/23/2018 06:22 PM   Modules accepted: Orders

## 2018-11-24 NOTE — Telephone Encounter (Signed)
Patient notified as instructed by telephone. Patient stated that she is scheduled for MRI in 3 weeks, but hoping to get in sooner.

## 2018-11-25 ENCOUNTER — Other Ambulatory Visit: Payer: Self-pay

## 2018-11-25 MED ORDER — PANTOPRAZOLE SODIUM 40 MG PO TBEC: 40.0000 mg | DELAYED_RELEASE_TABLET | Freq: Every day | ORAL | 0 refills | Status: DC

## 2018-11-28 ENCOUNTER — Telehealth: Payer: Self-pay | Admitting: Internal Medicine

## 2018-11-28 ENCOUNTER — Other Ambulatory Visit: Payer: Self-pay

## 2018-11-28 ENCOUNTER — Other Ambulatory Visit: Payer: Self-pay | Admitting: Family Medicine

## 2018-11-28 ENCOUNTER — Telehealth: Payer: Self-pay | Admitting: Family Medicine

## 2018-11-28 ENCOUNTER — Telehealth: Payer: Self-pay | Admitting: Gastroenterology

## 2018-11-28 MED ORDER — PANTOPRAZOLE SODIUM 40 MG PO TBEC: 40.0000 mg | DELAYED_RELEASE_TABLET | Freq: Two times a day (BID) | ORAL | 3 refills | Status: DC

## 2018-11-28 NOTE — Telephone Encounter (Signed)
Appointment 7/27

## 2018-11-28 NOTE — Telephone Encounter (Signed)
Sir I see in April where it was BID but looks like it ended 11/25/2018. Please advise the sig for her refill , thanks.

## 2018-11-28 NOTE — Telephone Encounter (Signed)
error 

## 2018-11-28 NOTE — Telephone Encounter (Signed)
Received Denial from [place] for the MRI shoulder that is scheduled on 12/13/18 at [facility]. Put the Denial letter along with the PT discharge summary in your in box. Peer to Peer can be done and they gave a number to call. Please advise if this is how you want to handle this. Patient was notified that it was Denied and I told her we would call her next week if there is anything more we can do to get this Approved. I didn't cancel the MRI.

## 2018-11-28 NOTE — Progress Notes (Signed)
error 

## 2018-11-28 NOTE — Telephone Encounter (Signed)
Mallory Stewart  Last fill 09/04/18 Last OV 11/06/18

## 2018-11-28 NOTE — Telephone Encounter (Signed)
Refill bid # [phone] refills

## 2018-11-28 NOTE — Telephone Encounter (Signed)
Can we get her a f/u next week?

## 2018-11-28 NOTE — Telephone Encounter (Signed)
Patient informed and said she appreciates [place].

## 2018-11-30 NOTE — Progress Notes (Signed)
Mallory Stewart T. Harryette Shuart, MD Primary Care and [place] at [facility] [place], [address] Phone: [phone]  FAX: [place] - 58 y.o. female  MRN [id number]  Date of Birth: [birth year]  Visit Date: 12/01/2018  PCP: [name], MD  Referred by: [name], MD  Chief Complaint  Patient presents with  . Follow-up    Right Shoulder   Subjective:   Mallory Stewart is a 58 y.o. very pleasant female patient with Body mass index is 25.4 kg/m. who presents with the following:  Presents w/ R sided shoulder pain: I initially saw her on 11/06/2018, when she already had some pain for 6 weeks.  At this point she was having functional limitation, and she had some abrupt pain with throwing a frisbee. At that time, I did a subac injection and had her start PT.  F/u R shoulder:  Not worse, but definitely not better.  Has a great deal of pain with basic movements and with some basic maneuvers.  Limited ABD nd dull ache and solid 7-8/10.  11/06/2018 Last OV with Owens Loffler, MD  Patient presents with ongoing R shoulder pain and Dr. Carlean Purl asked me to assist with giving her a B12 shot.  Recent Labs       Lab Results  Component Value Date   VITAMINB12 157 (L) 10/16/2018      B12 def  R shoulder has been bothering her for about six weeks. Will hurt with vaccuming and using a spatula.  Putting bra on is very painful.  Water aerobic will hurt. Flexion feels terrible.   Wakes up at night.  No injury.  Threw a frisbee to her dog.  ? Right shoulder pain since 6 weeks ago.   She recalls recalls having some pain significantly when she was throwing a Frisbee, and since that time she is not been able to do this.  Abrupt roughly onset of pain.  She is having pain particular with abduction and flexion.  She is also having some pain with internal range of motion.  No significant history of shoulder pain  including no fracture, dislocation or surgery in the past.  subac inj  r bic groove Drop pain   Past Medical History, Surgical History, Social History, Family History, Problem List, Medications, and Allergies have been reviewed and updated if relevant.  Patient Active Problem List   Diagnosis Date Noted  . Duodenitis - edema ? andgioedema 10/30/2018  . Chronic alcoholic pancreatitis ([place]) 09/02/2018  . Type 2 diabetes mellitus with diabetic polyneuropathy, with long-term current use of insulin ([place]) 08/22/2018  . Dyslipidemia 08/22/2018  . Gastric outlet obstruction 08/12/2018  . Vertigo 12/26/2017  . Chronic pain 05/17/2017  . Right shoulder pain 09/17/2016  . Osteoarthritis of spine with radiculopathy, cervical region 09/17/2016  . B12 deficiency 10/21/2014  . Neuropathic pain of both legs 02/11/2014  . Pancreatic pseudocyst/cyst 05/06/2013  . EXCESSIVE OR FREQUENT MENSTRUATION 05/10/2009  . HYPERCHOLESTEROLEMIA 11/03/2008  . Essential hypertension, benign 09/29/2008  . PAP SMEAR, ABNORMAL 09/29/2008    Past Medical History:  Diagnosis Date  . Alcohol-induced chronic pancreatitis ([place])   . B12 deficiency   . Concussion   . DDD (degenerative disc disease), cervical   . Diabetes ([place])   . DKA, type 2 ([place])   . Hypertension   . Neuropathy   . Seasonal allergies     Past Surgical History:  Procedure Laterality  Date  . CHOLECYSTECTOMY N/A 07/28/2016   Procedure: LAPAROSCOPIC CHOLECYSTECTOMY WITH INTRAOPERATIVE CHOLANGIOGRAM;  Surgeon: Mickeal Skinner, MD;  Location: [place];  Service: General;  Laterality: N/A;  . ESOPHAGOGASTRODUODENOSCOPY (EGD) WITH PROPOFOL N/A 08/13/2018   Procedure: ESOPHAGOGASTRODUODENOSCOPY (EGD) WITH PROPOFOL;  Surgeon: Irving Copas., MD;  Location: [facility];  Service: Gastroenterology;  Laterality: N/A;  . LEEP  1990's  . ORIF FOOT FRACTURE  09/2008   L 5th metatarsal  . REFRACTIVE SURGERY  2000  . TONSILLECTOMY    . UPPER  ESOPHAGEAL ENDOSCOPIC ULTRASOUND (EUS) N/A 08/13/2018   Procedure: UPPER ESOPHAGEAL ENDOSCOPIC ULTRASOUND (EUS);  Surgeon: Irving Copas., MD;  Location: [place] [facility];  Service: Gastroenterology;  Laterality: N/A;    Social History   Socioeconomic History  . Marital status: Married    Spouse name: Not on file  . Number of children: 1  . Years of education: Not on file  . Highest education level: Not on file  Occupational History  . Occupation: Mudlogger of business development  Social Needs  . Financial resource strain: Not on file  . Food insecurity    Worry: Not on file    Inability: Not on file  . Transportation needs    Medical: Not on file    Non-medical: Not on file  Tobacco Use  . Smoking status: Never Smoker  . Smokeless tobacco: Never Used  Substance and Sexual Activity  . Alcohol use: Not Currently    Alcohol/week: 7.0 standard drinks    Types: 7 Glasses of wine per week    Comment: wine  . Drug use: No  . Sexual activity: Not on file  Lifestyle  . Physical activity    Days per week: Not on file    Minutes per session: Not on file  . Stress: Not on file  Relationships  . Social Herbalist on phone: Not on file    Gets together: Not on file    Attends religious service: Not on file    Active member of club or organization: Not on file    Attends meetings of clubs or organizations: Not on file    Relationship status: Not on file  . Intimate partner violence    Fear of current or ex partner: Not on file    Emotionally abused: Not on file    Physically abused: Not on file    Forced sexual activity: Not on file  Other Topics Concern  . 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 Other Mother        tachycardia.Marland KitchenMarland Kitchen?afib  . Stroke Father        after hernia suegery  . Prostate cancer Father   . Other Father        global transient amnesia, unclear source  . Atrial fibrillation Sister   . Healthy Brother   . Healthy Brother   . Coronary artery disease Paternal Grandmother   . Heart attack Paternal Grandmother 63  . Brain cancer Maternal Grandfather        ?  Marland Kitchen  Cancer Paternal Grandfather        ?  . Breast cancer Maternal Grandmother     Allergies  Allergen Reactions  . Gluten Meal Other (See Comments)    unk  . Simvastatin Other (See Comments)    Elevated lfts?    Medication list reviewed and updated in full in [facility] [facility].  GEN: No fevers, chills. Nontoxic. Primarily MSK c/o today. MSK: Detailed in the HPI GI: tolerating PO intake without difficulty Neuro: No numbness, parasthesias, or tingling associated. Otherwise the pertinent positives of the ROS are noted above.   Objective:   BP 120/80   Pulse 71   Temp 98.7 F (37.1 C) (Temporal)   Ht 5' 8.25" (1.734 m)   Wt 168 lb 4 oz (76.3 kg)   BMI 25.40 kg/m    GEN: Well-developed,well-nourished,in no acute distress; alert,appropriate and cooperative throughout examination HEENT: Normocephalic and atraumatic without obvious abnormalities. Ears, externally no deformities PULM: Breathing comfortably in no respiratory distress EXT: No clubbing, cyanosis, or edema PSYCH: Normally interactive. Cooperative during the interview. Pleasant. Friendly and conversant. Not anxious or depressed appearing. Normal, full affect.  Shoulder: R Inspection: No muscle wasting or winging Ecchymosis/edema: neg  AC joint, scapula, clavicle: TTP Cervical spine: NT, full ROM Spurling's: neg Abduction: full PASSIVELY, LIMITED TO 50 DEG ACTIVELY, 3+/5 Flexion: full, 4/5 IR, full, lift-off: 5/5 ER  at neutral: full, 5/5 AC crossover: POS Neer: pos Hawkins: pos Drop Test: POS Empty Can: pos Supraspinatus insertion: mild-mod T Bicipital groove: TTP Speed's: neg Yergason's: neg Sulcus sign: neg Scapular dyskinesis: none C5-T1 intact  Neuro: Sensation intact Grip 5/5   Radiology: Dg Shoulder Right  Result Date: 11/06/2018 CLINICAL DATA:  RIGHT shoulder injury 6 weeks ago, pain with abduction and internal rotation question partial rotator cuff tear EXAM: RIGHT SHOULDER - 2+ VIEW COMPARISON:  09/17/2016 FINDINGS: Osseous demineralization. Degenerative changes of the AC joint. No acute fracture, dislocation, or bone destruction. Visualized RIGHT ribs intact. IMPRESSION: Degenerative changes of RIGHT AC joint. No acute abnormalities. Electronically Signed   By: Mark  Boles M.D.   On: 11/06/2018 14:32    Assessment and Plan:     ICD-10-CM   1. Acute pain of right shoulder  M25.511    Functional impairment in a patient who is failed outpatient management of right-sided shoulder pain for 8 weeks.  Status post treatment with oral NSAIDs, Tylenol, and subacromial injection.  She also has failed formal physical therapy.  Failed home exercise program.  Physical therapy made her symptoms worse.  The patient has a positive drop test, and significant pain with abduction and flexion.  Functional decline since last office visit.  Obtain an MRI of the right shoulder without contrast to evaluate for full-thickness rotator cuff tear versus high-grade partial-thickness rotator cuff tear.  Follow-up: No follow-ups on file.  No orders of the defined types were placed in this encounter.  No orders of the defined types were placed in this encounter.   Signed,   T. , MD   Outpatient Encounter Medications as of 12/01/2018  Medication Sig  . Blood Glucose Monitoring Suppl (ONE TOUCH ULTRA 2) w/Device KIT Use to check blood sugars two times day  . Cyanocobalamin (B-12) 1000 MCG TBCR  Take 2,000 mcg by mouth daily.  . dapagliflozin propanediol (FARXIGA) 5 MG TABS tablet Take 5 mg by mouth daily.  . fluticasone (FLONASE) 50 MCG/ACT nasal spray Place 2 sprays into both nostrils every morning.  . glucose blood (ONE TOUCH ULTRA   TEST) test strip CHECK BLOOD SUGAR TWO TIMES DAILY AS DIRECTED  . HYDROcodone-acetaminophen (NORCO/VICODIN) 5-325 MG tablet Take 1-2 tablets by mouth every 4 (four) hours as needed.  . Insulin Pen Needle (PEN NEEDLES) 31G X 5 MM MISC 1 application by Does not apply route 4 (four) times daily.  . Lancets (ONETOUCH ULTRASOFT) lancets Use to check blood sugar two times a day.  . levocetirizine (XYZAL) 5 MG tablet Take 5 mg by mouth at bedtime.  Marland Kitchen lisinopril (PRINIVIL,ZESTRIL) 40 MG tablet Take 1 tablet (40 mg total) by mouth daily.  . metoprolol tartrate (LOPRESSOR) 50 MG tablet TAKE 1 TABLET BY MOUTH TWICE A DAY  . Multiple Vitamin (MULTIVITAMIN WITH MINERALS) TABS tablet Take 1 tablet by mouth daily.  . ondansetron (ZOFRAN ODT) 4 MG disintegrating tablet 10m ODT q4 hours prn nausea/vomit  . pantoprazole (PROTONIX) 40 MG tablet Take 1 tablet (40 mg total) by mouth 2 (two) times daily before a meal.  . TRESIBA FLEXTOUCH 200 UNIT/ML SOPN INJECT 40 UNITS INTO THE SKIN EVERY MORNING.  . [DISCONTINUED] diclofenac (VOLTAREN) 75 MG EC tablet TAKE 1 TABLET BY MOUTH TWICE A DAY   No facility-administered encounter medications on file as of 12/01/2018.

## 2018-12-01 ENCOUNTER — Other Ambulatory Visit: Payer: Self-pay

## 2018-12-01 ENCOUNTER — Encounter: Payer: Self-pay | Admitting: Family Medicine

## 2018-12-01 ENCOUNTER — Ambulatory Visit: Payer: 59 | Admitting: Family Medicine

## 2018-12-01 VITALS — BP 120/80 | HR 71 | Temp 98.7°F | Ht 68.25 in | Wt 168.2 lb

## 2018-12-01 DIAGNOSIS — M25511 Pain in right shoulder: Secondary | ICD-10-CM | POA: Diagnosis not present

## 2018-12-02 ENCOUNTER — Encounter: Payer: Self-pay | Admitting: Family Medicine

## 2018-12-13 ENCOUNTER — Other Ambulatory Visit: Payer: Self-pay

## 2018-12-13 ENCOUNTER — Ambulatory Visit
Admission: RE | Admit: 2018-12-13 | Discharge: 2018-12-13 | Disposition: A | Discharge status: Home / Self Care | Payer: 59 | Source: Ambulatory Visit | Attending: Family Medicine | Admitting: Family Medicine

## 2018-12-13 DIAGNOSIS — M25511 Pain in right shoulder: Secondary | ICD-10-CM

## 2018-12-15 ENCOUNTER — Telehealth: Payer: Self-pay

## 2018-12-15 ENCOUNTER — Other Ambulatory Visit: Payer: Self-pay | Admitting: Internal Medicine

## 2018-12-15 NOTE — Telephone Encounter (Signed)
I did a prior authorization thru [employer] for patients Pantoprazole 40mg  BID dosing. It has been approved thru 12/15/2019. [facility] informed.

## 2018-12-16 ENCOUNTER — Telehealth: Payer: Self-pay | Admitting: *Deleted

## 2018-12-16 NOTE — Telephone Encounter (Signed)
Patient left a voicemail stating that she has been seeing Dr. Lorelei Pont for her shoulder pain. Patient stated that she had a MRI Saturday and wants to know what the next step would be?

## 2018-12-17 NOTE — Telephone Encounter (Signed)
Will call.

## 2018-12-17 NOTE — Telephone Encounter (Signed)
Patient left another voicemail requesting a call back regarding MRI.

## 2018-12-17 NOTE — Telephone Encounter (Signed)
Will call. Can be difficult in the middle of the work day.

## 2018-12-18 ENCOUNTER — Other Ambulatory Visit (HOSPITAL_COMMUNITY): Payer: 59

## 2018-12-21 ENCOUNTER — Other Ambulatory Visit: Payer: Self-pay | Admitting: Family Medicine

## 2018-12-22 ENCOUNTER — Ambulatory Visit: Payer: 59 | Admitting: Family Medicine

## 2018-12-22 ENCOUNTER — Encounter: Payer: Self-pay | Admitting: Family Medicine

## 2018-12-22 ENCOUNTER — Other Ambulatory Visit: Payer: Self-pay

## 2018-12-22 VITALS — BP 120/80 | HR 74 | Temp 98.4°F | Ht 68.25 in | Wt 169.2 lb

## 2018-12-22 DIAGNOSIS — M7591 Shoulder lesion, unspecified, right shoulder: Secondary | ICD-10-CM

## 2018-12-22 MED ORDER — METHYLPREDNISOLONE ACETATE 40 MG/ML IJ SUSP
40.0000 mg | Freq: Once | INTRAMUSCULAR | Status: AC
  Administered 2018-12-22: 15:00:00 40 mg via INTRAMUSCULAR

## 2018-12-22 NOTE — Addendum Note (Signed)
Addended by: Carter Kitten on: 12/22/2018 02:46 PM   Modules accepted: Orders

## 2018-12-22 NOTE — Progress Notes (Signed)
Mallory Stewart [name], MD Primary Care and [place] at [facility] [place], [address] Phone: [phone]   FAX: [place] - 58 y.o. female   MRN [id number]   Date of Birth: 12-Jan-1961  Visit Date: 12/22/2018   PCP: [name], MD   Referred by: [name], MD  Chief Complaint  Patient presents with   Follow-up    Right Shoulder   Subjective:   Mallory Stewart is a 58 y.o. very pleasant female patient with Body mass index is 25.55 kg/m. who presents with the following:  She is a really nice lady and I have seen her multiple times.  Last time I saw her I was worried about a potential rotator cuff tear, ultimately got an MRI of her shoulder.  This showed some mild supraspinatus tendinopathy only.  At this point her range of motion and pain is improved, she rates her pain 3 out of 4.  She has been able to go to water aerobics and thinks that this feels better.  R shoulder, f/u Right shoulder injection intraa supra  Past Medical History, Surgical History, Social History, Family History, Problem List, Medications, and Allergies have been reviewed and updated if relevant.  Patient Active Problem List   Diagnosis Date Noted   Duodenitis - edema ? andgioedema 10/30/2018   Chronic alcoholic pancreatitis ([place]) 09/02/2018   Type 2 diabetes mellitus with diabetic polyneuropathy, with long-term current use of insulin ([place]) 08/22/2018   Dyslipidemia 08/22/2018   Gastric outlet obstruction 08/12/2018   Vertigo 12/26/2017   Chronic pain 05/17/2017   Right shoulder pain 09/17/2016   Osteoarthritis of spine with radiculopathy, cervical region 09/17/2016   B12 deficiency 10/21/2014   Neuropathic pain of both legs 02/11/2014   Pancreatic pseudocyst/cyst 05/06/2013   EXCESSIVE OR FREQUENT MENSTRUATION 05/10/2009   HYPERCHOLESTEROLEMIA 11/03/2008   Essential hypertension, benign  09/29/2008   PAP SMEAR, ABNORMAL 09/29/2008    Past Medical History:  Diagnosis Date   Alcohol-induced chronic pancreatitis ([place])    B12 deficiency    Concussion    DDD (degenerative disc disease), cervical    Diabetes ([place])    DKA, type 2 ([place])    Hypertension    Neuropathy    Seasonal allergies     Past Surgical History:  Procedure Laterality Date   CHOLECYSTECTOMY N/A 07/28/2016   Procedure: LAPAROSCOPIC CHOLECYSTECTOMY WITH INTRAOPERATIVE CHOLANGIOGRAM;  Surgeon: Mickeal Skinner, MD;  [facility];  Service: General;  Laterality: N/A;   ESOPHAGOGASTRODUODENOSCOPY (EGD) WITH PROPOFOL N/A 08/13/2018   Procedure: ESOPHAGOGASTRODUODENOSCOPY (EGD) WITH PROPOFOL;  Surgeon: Irving Copas., MD;  Location: [facility];  Service: Gastroenterology;  Laterality: N/A;   LEEP  1990's   ORIF FOOT FRACTURE  09/2008   L 5th metatarsal   REFRACTIVE SURGERY  2000   TONSILLECTOMY     UPPER ESOPHAGEAL ENDOSCOPIC ULTRASOUND (EUS) N/A 08/13/2018   Procedure: UPPER ESOPHAGEAL ENDOSCOPIC ULTRASOUND (EUS);  Surgeon: Irving Copas., MD;  Location: [facility];  Service: Gastroenterology;  Laterality: N/A;    Social History   Socioeconomic History   Marital status: Married    Spouse name: Not on file   Number of children: 1   Years of education: Not on file   Highest education level: Not on file  Occupational History   Occupation: Mudlogger of business development  Social Needs   Financial resource strain: Not on file   Food insecurity  Worry: Not on file    Inability: Not on file   Transportation needs    Medical: Not on file    Non-medical: Not on file  Tobacco Use   Smoking status: Never Smoker   Smokeless tobacco: Never Used  Substance and Sexual Activity   Alcohol use: Not Currently    Alcohol/week: 7.0 standard drinks    Types: 7 Glasses of wine per week    Comment: wine   Drug use: No   Sexual activity: Not on file    Lifestyle   Physical activity    Days per week: Not on file    Minutes per session: Not on file   Stress: Not on file  Relationships   Social connections    Talks on phone: Not on file    Gets together: Not on file    Attends religious service: Not on file    Active member of club or organization: Not on file    Attends meetings of clubs or organizations: Not on file    Relationship status: Not on file   Intimate partner violence    Fear of current or ex partner: Not on file    Emotionally abused: Not on file    Physically abused: Not on file    Forced sexual activity: Not on file  Other Topics Concern   Not on file  Social History Narrative   Patient is married one stepson    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 not traveling   regular exercise , walking 4-5 times a week   Diet fruits and veggies, water   Alcohol at least several glasses of wine a week Stopped 08/2018   Never smoker, no drug use    Family History  Problem Relation Age of Onset   Other Mother        tachycardia.Marland KitchenMarland Kitchen?afib   Stroke Father        after hernia suegery   Prostate cancer Father    Other Father        global transient amnesia, unclear source   Atrial fibrillation Sister    Healthy Brother    Healthy Brother    Coronary artery disease Paternal Grandmother    Heart attack Paternal Grandmother 70   Brain cancer Maternal Grandfather        ?   Cancer Paternal Grandfather        ?   Breast cancer Maternal Grandmother     Allergies  Allergen Reactions   Gluten Meal Other (See Comments)    unk   Simvastatin Other (See Comments)    Elevated lfts?    Medication list reviewed and updated in full in [place].  GEN: No fevers, chills. Nontoxic. Primarily MSK c/o today. MSK: Detailed in the HPI GI: tolerating PO intake without difficulty Neuro: No numbness, parasthesias, or tingling  associated. Otherwise the pertinent positives of the ROS are noted above.   Objective:   BP 120/80    Pulse 74    Temp 98.4 F (36.9 C) (Temporal)    Ht 5' 8.25" (1.734 m)    Wt 169 lb 4 oz (76.8 kg)    SpO2 97%    BMI 25.55 kg/m    GEN: WDWN, NAD, Non-toxic, Alert & Oriented x 3 HEENT: Atraumatic, Normocephalic.  Ears and Nose: No external deformity. EXTR: No clubbing/cyanosis/edema NEURO: Normal gait.  PSYCH: Normally interactive. Conversant. Not depressed or anxious  appearing.  Calm demeanor.    Right shoulder, full range of motion with some painful arc.  Strength 5/5 in all planes of movement. Luan Pulling and Merrilyn Puma are still positive as well as near to a much lesser degree.  Radiology: Mr Shoulder Right Wo Contrast  Result Date: 12/15/2018 CLINICAL DATA:  Right shoulder pain for 2 months. No known injury or prior relevant surgery. Rotator cuff tear/impingement suspected. EXAM: MRI OF THE RIGHT SHOULDER WITHOUT CONTRAST TECHNIQUE: Multiplanar, multisequence MR imaging of the shoulder was performed. No intravenous contrast was administered. COMPARISON:  Radiographs 11/06/2018. FINDINGS: Rotator cuff: Mild supraspinatus tendinosis without evidence of tear. The infraspinatus, subscapularis and teres minor tendons appear normal. Muscles:  No focal muscular atrophy or edema. Biceps long head:  Intact and normally positioned. Acromioclavicular Joint: The acromion is type 1. There are moderate acromioclavicular degenerative changes. No significant fluid is present in the subacromial - subdeltoid bursa. Glenohumeral Joint: No significant shoulder joint effusion or glenohumeral arthropathy. Labrum:  No evidence of labral tear or paralabral cyst. Bones: No acute or significant extra-articular osseous findings. There is prominent subcortical cyst formation posteriorly in the humeral head near the infraspinatus insertion. Other: No significant soft tissue findings. IMPRESSION: 1. Mild supraspinatus  tendinosis.  No evidence of rotator cuff tear. 2. The biceps tendon and labrum appear intact. 3. Moderate acromioclavicular degenerative changes. Electronically Signed   By: Richardean Sale M.D.   On: 12/15/2018 07:49    Assessment and Plan:     ICD-10-CM   1. Supraspinatus tendinitis, right  M75.91    Continue with home rehab.  I am to do an intra-articular injection today to see if this provides greater relief.  Intraarticular Shoulder Aspiration/Injection Procedure Note Mallory Stewart 1961-02-04 Date of procedure: 12/22/2018  Procedure: Large Joint Aspiration / Injection of Shoulder, Intraarticular, R Indications: Pain  Procedure Details Verbal consent was obtained from the patient. Risks including infection explained and contrasted with benefits and alternatives. Patient prepped with Chloraprep and Ethyl Chloride used for anesthesia. An intraarticular shoulder injection was performed using the posterior approach; needle placed into joint capsule without difficulty. The patient tolerated the procedure well and had decreased pain post injection. No complications. Injection: 8 cc of Lidocaine 1% and 2 mL Depo-Medrol 40 mg. Needle: 21 gauge, 2 inch   Follow-up: No follow-ups on file.  No orders of the defined types were placed in this encounter.  No orders of the defined types were placed in this encounter.   Signed,  Maud Deed. Kalima Saylor, MD   Outpatient Encounter Medications as of 12/22/2018  Medication Sig   Blood Glucose Monitoring Suppl (ONE TOUCH ULTRA 2) w/Device KIT Use to check blood sugars two times day   Cyanocobalamin (B-12) 1000 MCG TBCR Take 2,000 mcg by mouth daily.   FARXIGA 5 MG TABS tablet TAKE 1 TABLET BY MOUTH EVERY DAY   fluticasone (FLONASE) 50 MCG/ACT nasal spray Place 2 sprays into both nostrils every morning.   glucose blood (ONE TOUCH ULTRA TEST) test strip CHECK BLOOD SUGAR TWO TIMES DAILY AS DIRECTED   HYDROcodone-acetaminophen  (NORCO/VICODIN) 5-325 MG tablet Take 1-2 tablets by mouth every 4 (four) hours as needed.   Insulin Pen Needle (PEN NEEDLES) 31G X 5 MM MISC 1 application by Does not apply route 4 (four) times daily.   Lancets (ONETOUCH ULTRASOFT) lancets Use to check blood sugar two times a day.   levocetirizine (XYZAL) 5 MG tablet Take 5 mg by mouth at bedtime.   lisinopril (ZESTRIL) 40  MG tablet TAKE 1 TABLET BY MOUTH EVERY DAY   metoprolol tartrate (LOPRESSOR) 50 MG tablet TAKE 1 TABLET BY MOUTH TWICE A DAY   Multiple Vitamin (MULTIVITAMIN WITH MINERALS) TABS tablet Take 1 tablet by mouth daily.   ondansetron (ZOFRAN ODT) 4 MG disintegrating tablet '4mg'$  ODT q4 hours prn nausea/vomit   pantoprazole (PROTONIX) 40 MG tablet Take 1 tablet (40 mg total) by mouth 2 (two) times daily before a meal.   TRESIBA FLEXTOUCH 200 UNIT/ML SOPN INJECT 40 UNITS INTO THE SKIN EVERY MORNING.   No facility-administered encounter medications on file as of 12/22/2018.

## 2018-12-23 ENCOUNTER — Telehealth: Payer: Self-pay

## 2018-12-23 NOTE — Telephone Encounter (Signed)
Farxiga 5 mg approval received from [place] for 12/22/2018-12/21/2021

## 2018-12-31 ENCOUNTER — Telehealth: Payer: Self-pay | Admitting: Internal Medicine

## 2018-12-31 ENCOUNTER — Telehealth: Payer: Self-pay

## 2018-12-31 MED ORDER — FARXIGA 10 MG PO TABS: 10.0000 mg | ORAL_TABLET | Freq: Every day | ORAL | 6 refills | Status: DC

## 2018-12-31 NOTE — Telephone Encounter (Signed)
Approval for farxiga 5 mg from 12/22/2018-12/21/2021 [facility]

## 2018-12-31 NOTE — Telephone Encounter (Signed)
Pt called to inform that her sugar has been high. 08/26 am 515. It goes down to low 200s.  08/25 am 510 Please advise? Pt has been drinking Gatorade zero at night.   Call pt @ [phone]. Thank you!

## 2018-12-31 NOTE — Telephone Encounter (Signed)
Pt taking 5 mg farxiga and 34 units of tresiba every morning, these readings are not accurate as she guessed because she did not have her meter with her, please advise

## 2018-12-31 NOTE — Telephone Encounter (Signed)
Pt informed of increase and f/u scheduled for 01/15/2019 @10 :10

## 2019-01-14 NOTE — Progress Notes (Deleted)
Name: Mallory Stewart  Age/ Sex: 58 y.o., female   MRN/ DOB: [id number], [birth year]     PCP: [name], MD   Reason for Endocrinology Evaluation: Type 2 Diabetes Mellitus     Initial Endocrinology Clinic Visit: 08/22/2018    PATIENT IDENTIFIER: Mallory Stewart is a 58 y.o. female with a past medical history of HTN, T2DM, chronic pancreatitis and gastric outlet obstruction . The patient has followed with Endocrinology clinic since 08/22/2018 for consultative assistance with management of her diabetes.  DIABETIC HISTORY:  Mallory Stewart was diagnosed with T2DM in 2010.Pt had hospital admission in 2014 for pancreatitis secondary to ETOH intake, and another one in 2018 secondary to Gallstones but she al;so had binged the prior day on ETOH intake. Insulin started in 07/2018 . Her hemoglobin A1c has ranged from 5.4% in 2016, peaking at 13.3%  In 2020.  Prandial insulin 08/2018  SUBJECTIVE:   During the last visit (10/03/2018): We continued [place] 28 units, she was started on Farxiga and advised to stop the 4 units of Novolog daily .   Today (01/14/2019): Mallory Stewart is here for a 4 month follow  Up on diabetes management.  She checks her blood sugars 3-4 times daily, preprandial and bedtime. The patient has had hypoglycemic episodes since the last clinic visit, which typically occur 2 x /week - most often occuring after lunch . The patient is symptomatic with these episodes, with symptoms of shaking. Otherwise, the patient has not required any recent emergency interventions for hypoglycemia and has not had recent hospitalizations secondary to hyper or hypoglycemic episodes.    ROS: As per HPI and as detailed below: Review of Systems  Respiratory: Negative for cough and shortness of breath.   Cardiovascular: Negative for chest pain and palpitations.  Gastrointestinal: Negative for diarrhea and nausea.  Genitourinary: Negative for frequency.  Endo/Heme/Allergies: Negative for  polydipsia.      HOME DIABETES REGIMEN:   Tresiba 28  units Daily - has been taking    Novolog 4 units TID QAC  Farxiga 5 mg      GLUCOSE LOG:  Date Breakfast  Lunch  Dinner Bedtime  10/03/18 97     5/28 153 130 126   5/27 135 108 109 125  5/26 167 250 120 114       HISTORY:  Past Medical History:  Past Medical History:  Diagnosis Date  . Alcohol-induced chronic pancreatitis ([place])   . B12 deficiency   . Concussion   . DDD (degenerative disc disease), cervical   . Diabetes ([place])   . DKA, type 2 ([place])   . Hypertension   . Neuropathy   . Seasonal allergies    Past Surgical History:  Past Surgical History:  Procedure Laterality Date  . CHOLECYSTECTOMY N/A 07/28/2016   Procedure: LAPAROSCOPIC CHOLECYSTECTOMY WITH INTRAOPERATIVE CHOLANGIOGRAM;  Surgeon: Mickeal Skinner, MD;  Location: [facility];  Service: General;  Laterality: N/A;  . ESOPHAGOGASTRODUODENOSCOPY (EGD) WITH PROPOFOL N/A 08/13/2018   Procedure: ESOPHAGOGASTRODUODENOSCOPY (EGD) WITH PROPOFOL;  Surgeon: Irving Copas., MD;  Location: [facility];  Service: Gastroenterology;  Laterality: N/A;  . LEEP  1990's  . ORIF FOOT FRACTURE  09/2008   L 5th metatarsal  . REFRACTIVE SURGERY  2000  . TONSILLECTOMY    . UPPER ESOPHAGEAL ENDOSCOPIC ULTRASOUND (EUS) N/A 08/13/2018   Procedure: UPPER ESOPHAGEAL ENDOSCOPIC ULTRASOUND (EUS);  Surgeon: Irving Copas., MD;  Location: [facility];  Service: Gastroenterology;  Laterality: N/A;  Social History:  reports that she has never smoked. She has never used smokeless tobacco. She reports previous alcohol use of about 7.0 standard drinks of alcohol per week. She reports that she does not use drugs. Family History:  Family History  Problem Relation Age of Onset  . Other Mother        tachycardia.Marland KitchenMarland Kitchen?afib  . Stroke Father        after hernia suegery  . Prostate cancer Father   . Other Father        global transient amnesia, unclear source  . Atrial  fibrillation Sister   . Healthy Brother   . Healthy Brother   . Coronary artery disease Paternal Grandmother   . Heart attack Paternal Grandmother 81  . Brain cancer Maternal Grandfather        ?  Marland Kitchen Cancer Paternal Grandfather        ?  Marland Kitchen Breast cancer Maternal Grandmother      HOME MEDICATIONS: Allergies as of 01/15/2019      Reactions   Gluten Meal Other (See Comments)   unk   Simvastatin Other (See Comments)   Elevated lfts?      Medication List       Accurate as of January 14, 2019  3:05 PM. If you have any questions, ask your nurse or doctor.        B-12 1000 MCG Tbcr Take 2,000 mcg by mouth daily.   Farxiga 10 MG Tabs tablet Generic drug: dapagliflozin propanediol Take 10 mg by mouth daily before breakfast.   fluticasone 50 MCG/ACT nasal spray Commonly known as: FLONASE Place 2 sprays into both nostrils every morning.   glucose blood test strip Commonly known as: ONE TOUCH ULTRA TEST CHECK BLOOD SUGAR TWO TIMES DAILY AS DIRECTED   HYDROcodone-acetaminophen 5-325 MG tablet Commonly known as: NORCO/VICODIN Take 1-2 tablets by mouth every 4 (four) hours as needed.   levocetirizine 5 MG tablet Commonly known as: XYZAL Take 5 mg by mouth at bedtime.   lisinopril 40 MG tablet Commonly known as: ZESTRIL TAKE 1 TABLET BY MOUTH EVERY DAY   metoprolol tartrate 50 MG tablet Commonly known as: LOPRESSOR TAKE 1 TABLET BY MOUTH TWICE A DAY   multivitamin with minerals Tabs tablet Take 1 tablet by mouth daily.   ondansetron 4 MG disintegrating tablet Commonly known as: Zofran ODT '4mg'$  ODT q4 hours prn nausea/vomit   ONE TOUCH ULTRA 2 w/Device Kit Use to check blood sugars two times day   onetouch ultrasoft lancets Use to check blood sugar two times a day.   pantoprazole 40 MG tablet Commonly known as: PROTONIX Take 1 tablet (40 mg total) by mouth 2 (two) times daily before a meal.   Pen Needles 31G X 5 MM Misc 1 application by Does not apply route 4  (four) times daily.   Tyler Aas FlexTouch 200 UNIT/ML Sopn Generic drug: Insulin Degludec INJECT 40 UNITS INTO THE SKIN EVERY MORNING.         DATA REVIEWED:  Lab Results  Component Value Date   HGBA1C 7.0 (H) 10/16/2018   HGBA1C 13.3 (H) 07/23/2018   HGBA1C 9.2 (H) 12/30/2017   Lab Results  Component Value Date   LDLCALC 82 10/16/2018   CREATININE 1.28 (H) 10/28/2018     Lab Results  Component Value Date   CHOL 201 (H) 10/16/2018   HDL 104.50 10/16/2018   LDLCALC 82 10/16/2018   LDLDIRECT 160.5 02/20/2012   TRIG 70.0 10/16/2018   CHOLHDL 2  10/16/2018         ASSESSMENT / PLAN / RECOMMENDATIONS:   1) Type 2 Diabetes Mellitus, Poorly controlled, In the setting of Chronic Pancreatitis Withneuropathic complications - Most recent A1c of 13.3 %. Goal A1c < 7.0 %.    Plan:  - Her BG's have improved a great deal. I have congratulated her on the hard work, compliance with glucose checks, taking medications and continuing with lifestyle changes.  - She is currently on 6 units of Novolog and continues to have hypoglycemia. We discussed oral glycemic agents to be able to taper off prandial insulin  - She intolerant to metformin and would like not not try it again. Due to hx of pancreatitis, she is not a candidate for DPP-4 inhibitors nor GLP-1 agonists. We discussed SU and SGLT-2 inhibitors.  - She is interested in SGLT_2 inhibitors, we discussed risk of genital infections, we discussed benefits to include cardiovascular, glucose and weight.  - Once she starts on SGLT-2 inhibitors, she could stop Novolog but continue tresiba, but until then, she can use novolog 4 units with each meal      MEDICATIONS:  Continue Tresiba 28 unirs daily   Stop Novolog once Wilder Glade is started, but until then use 4 units TODQAC   Farxiga 5 mg daily   EDUCATION / INSTRUCTIONS:  BG monitoring instructions: Patient is instructed to check her blood sugars 4 times a day, before meals and  bedtime.  Call [facility] if: BG persistently < 70 or > 300. . I reviewed the Rule of 15 for the treatment of hypoglycemia in detail with the patient. Literature supplied.       I discussed the assessment and treatment plan with the patient. The patient was provided an opportunity to ask questions and all were answered. The patient agreed with the plan and demonstrated an understanding of the instructions.   The patient was advised to call back or seek an in-person evaluation if the symptoms worsen or if the condition fails to improve as anticipated.   F/U in 8 weeks    Signed electronically by: Mack Guise, MD  [facility]  [facility] [address] [place], [place] [address] Phone: [phone] FAX: [phone]   CC: [name], MD [place] [address] Phone: [phone]  Fax: [phone]  Return to Endocrinology clinic as below: Future Appointments  Date Time Provider [place]  01/15/2019  9:00 AM [facility] None  01/15/2019 10:10 AM , Melanie Crazier, MD [facility] None

## 2019-01-15 ENCOUNTER — Ambulatory Visit: Payer: 59 | Admitting: Internal Medicine

## 2019-01-15 ENCOUNTER — Other Ambulatory Visit (HOSPITAL_COMMUNITY)
Admission: RE | Admit: 2019-01-15 | Discharge: 2019-01-15 | Disposition: A | Discharge status: Home / Self Care | Payer: 59 | Source: Ambulatory Visit | Attending: Gastroenterology | Admitting: Gastroenterology

## 2019-01-15 DIAGNOSIS — K863 Pseudocyst of pancreas: Secondary | ICD-10-CM | POA: Diagnosis not present

## 2019-01-15 DIAGNOSIS — K317 Polyp of stomach and duodenum: Secondary | ICD-10-CM | POA: Insufficient documentation

## 2019-01-15 DIAGNOSIS — Z20828 Contact with and (suspected) exposure to other viral communicable diseases: Secondary | ICD-10-CM | POA: Insufficient documentation

## 2019-01-15 DIAGNOSIS — Z01812 Encounter for preprocedural laboratory examination: Secondary | ICD-10-CM | POA: Diagnosis present

## 2019-01-15 DIAGNOSIS — K861 Other chronic pancreatitis: Secondary | ICD-10-CM | POA: Insufficient documentation

## 2019-01-16 LAB — NOVEL CORONAVIRUS, NAA (HOSP ORDER, SEND-OUT TO REF LAB; TAT 18-24 HRS): SARS-CoV-2, NAA: NOT DETECTED

## 2019-01-16 NOTE — Progress Notes (Signed)
I was unable to reach patient by phone.  I left  A message on voice mail.  I instructed the patient to arrive at [facility] entrance at [place].  , register in the [facility]. DO NOT eat or drink anything after midnight.  I instructed the patient to take the following medications in the am with just enough water to get them down: Xyzal, Metoprolol, Protonix, use Flonase. Ms Montez Hageman has type II diabetes, I instructed patient to not take Samule Ohm Sunday or Monday am. I instructed Ms Brandle to take 1/2 of scheduled Tresiba Monday AM if CBG is >70. I instructed patient to check CBG after awaking and every 2 hours until arrival  to the hospital.  I Instructed patient if CBG is less than 70 to take 4 Glucose Tablets or 1 tube of Glucose Gel or 1/2 cup of a clear juice. Recheck CBG in 15 minutes then call pre- op desk at [phone] for further instructions. If scheduled to receive Insulin, d not take Insulin   I asked patient to not wear any lotions, powders, cologne, jewelry, piercing, make-up or nail polish.  Wear clean clothes. Brush teeth. I informed patient that there will need to be a driver and someone to stay with him/her for the first 24 hours after surgery.   I instructed  patient to call [phone], in the am if there were any questions or problems.

## 2019-01-18 NOTE — Anesthesia Preprocedure Evaluation (Addendum)
Anesthesia Evaluation  Patient identified by MRN, date of birth, ID band Patient awake    Reviewed: Allergy & Precautions, NPO status , Patient's Chart, lab work & pertinent test results, reviewed documented beta blocker date and time   History of Anesthesia Complications Negative for: history of anesthetic complications  Airway Mallampati: II  TM Distance: >3 FB Neck ROM: Full    Dental  (+) Dental Advisory Given   Pulmonary neg pulmonary ROS,    Pulmonary exam normal        Cardiovascular hypertension, Pt. on medications and Pt. on home beta blockers Normal cardiovascular exam   '20 TTE - Normal EF, moderately dilated LA    Neuro/Psych  Vertigo Neuropathy  negative psych ROS   GI/Hepatic (+)     substance abuse  alcohol use,  Chronic pancreatitis    Endo/Other  diabetes, Type 2, Insulin Dependent, Oral Hypoglycemic Agents  Renal/GU negative Renal ROS     Musculoskeletal  (+) Arthritis , Osteoarthritis,    Abdominal   Peds  Hematology negative hematology ROS (+)   Anesthesia Other Findings   Reproductive/Obstetrics                            Anesthesia Physical Anesthesia Plan  ASA: III  Anesthesia Plan: MAC   Post-op Pain Management:    Induction: Intravenous  PONV Risk Score and Plan: 2 and Propofol infusion and Treatment may vary due to age or medical condition  Airway Management Planned: Natural Airway and Nasal Cannula  Additional Equipment: None  Intra-op Plan:   Post-operative Plan:   Informed Consent: I have reviewed the patients History and Physical, chart, labs and discussed the procedure including the risks, benefits and alternatives for the proposed anesthesia with the patient or authorized representative who has indicated his/her understanding and acceptance.       Plan Discussed with: CRNA and Anesthesiologist  Anesthesia Plan Comments:         Anesthesia Quick Evaluation

## 2019-01-19 ENCOUNTER — Encounter (HOSPITAL_COMMUNITY)
Admission: RE | Disposition: A | Discharge status: Home / Self Care | Payer: Self-pay | Source: Home / Self Care | Attending: Gastroenterology

## 2019-01-19 ENCOUNTER — Ambulatory Visit (HOSPITAL_COMMUNITY): Payer: 59 | Admitting: Anesthesiology

## 2019-01-19 ENCOUNTER — Ambulatory Visit (HOSPITAL_COMMUNITY)
Admission: RE | Admit: 2019-01-19 | Discharge: 2019-01-19 | Disposition: A | Discharge status: Home / Self Care | Payer: 59 | Attending: Gastroenterology | Admitting: Gastroenterology

## 2019-01-19 ENCOUNTER — Other Ambulatory Visit: Payer: Self-pay

## 2019-01-19 ENCOUNTER — Encounter (HOSPITAL_COMMUNITY): Payer: Self-pay | Admitting: Certified Registered Nurse Anesthetist

## 2019-01-19 DIAGNOSIS — Z8719 Personal history of other diseases of the digestive system: Secondary | ICD-10-CM | POA: Insufficient documentation

## 2019-01-19 DIAGNOSIS — K317 Polyp of stomach and duodenum: Secondary | ICD-10-CM | POA: Insufficient documentation

## 2019-01-19 DIAGNOSIS — K838 Other specified diseases of biliary tract: Secondary | ICD-10-CM | POA: Insufficient documentation

## 2019-01-19 DIAGNOSIS — K2980 Duodenitis without bleeding: Secondary | ICD-10-CM | POA: Insufficient documentation

## 2019-01-19 DIAGNOSIS — K860 Alcohol-induced chronic pancreatitis: Secondary | ICD-10-CM

## 2019-01-19 DIAGNOSIS — K862 Cyst of pancreas: Secondary | ICD-10-CM | POA: Diagnosis not present

## 2019-01-19 DIAGNOSIS — Z79899 Other long term (current) drug therapy: Secondary | ICD-10-CM | POA: Diagnosis not present

## 2019-01-19 DIAGNOSIS — R935 Abnormal findings on diagnostic imaging of other abdominal regions, including retroperitoneum: Secondary | ICD-10-CM | POA: Diagnosis present

## 2019-01-19 DIAGNOSIS — Z794 Long term (current) use of insulin: Secondary | ICD-10-CM | POA: Insufficient documentation

## 2019-01-19 DIAGNOSIS — K869 Disease of pancreas, unspecified: Secondary | ICD-10-CM

## 2019-01-19 DIAGNOSIS — I10 Essential (primary) hypertension: Secondary | ICD-10-CM | POA: Insufficient documentation

## 2019-01-19 DIAGNOSIS — K8689 Other specified diseases of pancreas: Secondary | ICD-10-CM | POA: Insufficient documentation

## 2019-01-19 DIAGNOSIS — E119 Type 2 diabetes mellitus without complications: Secondary | ICD-10-CM | POA: Insufficient documentation

## 2019-01-19 HISTORY — PX: BIOPSY: SHX5522

## 2019-01-19 HISTORY — PX: ESOPHAGOGASTRODUODENOSCOPY (EGD) WITH PROPOFOL: SHX5813

## 2019-01-19 HISTORY — PX: UPPER ESOPHAGEAL ENDOSCOPIC ULTRASOUND (EUS): SHX6562

## 2019-01-19 LAB — GLUCOSE, CAPILLARY: Glucose-Capillary: 85 mg/dL (ref 70–99)

## 2019-01-19 SURGERY — UPPER ESOPHAGEAL ENDOSCOPIC ULTRASOUND (EUS)
Anesthesia: Monitor Anesthesia Care

## 2019-01-19 MED ORDER — SODIUM CHLORIDE 0.9 % IV SOLN: INTRAVENOUS | Status: DC

## 2019-01-19 MED ORDER — LACTATED RINGERS IV SOLN
INTRAVENOUS | Status: DC | PRN
  Administered 2019-01-19: 08:00:00 via INTRAVENOUS

## 2019-01-19 MED ORDER — PROPOFOL 500 MG/50ML IV EMUL
INTRAVENOUS | Status: DC | PRN
  Administered 2019-01-19: 75 ug/kg/min via INTRAVENOUS
  Administered 2019-01-19: 150 ug/kg/min via INTRAVENOUS

## 2019-01-19 MED ORDER — PROPOFOL 10 MG/ML IV BOLUS
INTRAVENOUS | Status: DC | PRN
  Administered 2019-01-19 (×4): 40 mg via INTRAVENOUS
  Administered 2019-01-19: 20 mg via INTRAVENOUS
  Administered 2019-01-19: 40 mg via INTRAVENOUS
  Administered 2019-01-19: 80 mg via INTRAVENOUS
  Administered 2019-01-19: 20 mg via INTRAVENOUS
  Administered 2019-01-19 (×3): 40 mg via INTRAVENOUS

## 2019-01-19 MED ORDER — LIDOCAINE HCL (CARDIAC) PF 100 MG/5ML IV SOSY
PREFILLED_SYRINGE | INTRAVENOUS | Status: DC | PRN
  Administered 2019-01-19: 100 mg via INTRAVENOUS

## 2019-01-19 MED ORDER — GLYCOPYRROLATE 0.2 MG/ML IJ SOLN
INTRAMUSCULAR | Status: DC | PRN
  Administered 2019-01-19: 0.1 mg via INTRAVENOUS

## 2019-01-19 MED ORDER — ONDANSETRON HCL 4 MG/2ML IJ SOLN
INTRAMUSCULAR | Status: DC | PRN
  Administered 2019-01-19: 8 mg via INTRAVENOUS

## 2019-01-19 NOTE — H&P (Signed)
GASTROENTEROLOGY PROCEDURE H&P NOTE   Primary Care Physician: [name], MD  HPI: Mallory Stewart is a 58 y.o. female who presents for EGD/EUS.  Past Medical History:  Diagnosis Date  . Alcohol-induced chronic pancreatitis ([place])   . B12 deficiency   . Concussion   . DDD (degenerative disc disease), cervical   . Diabetes ([place])   . DKA, type 2 ([place])   . Hypertension   . Neuropathy   . Seasonal allergies    Past Surgical History:  Procedure Laterality Date  . CHOLECYSTECTOMY N/A 07/28/2016   Procedure: LAPAROSCOPIC CHOLECYSTECTOMY WITH INTRAOPERATIVE CHOLANGIOGRAM;  Surgeon: Mickeal Skinner, MD;  Location: [facility];  Service: General;  Laterality: N/A;  . ESOPHAGOGASTRODUODENOSCOPY (EGD) WITH PROPOFOL N/A 08/13/2018   Procedure: ESOPHAGOGASTRODUODENOSCOPY (EGD) WITH PROPOFOL;  Surgeon: Irving Copas., MD;  Location: [facility];  Service: Gastroenterology;  Laterality: N/A;  . LEEP  1990's  . ORIF FOOT FRACTURE  09/2008   L 5th metatarsal  . REFRACTIVE SURGERY  2000  . TONSILLECTOMY    . UPPER ESOPHAGEAL ENDOSCOPIC ULTRASOUND (EUS) N/A 08/13/2018   Procedure: UPPER ESOPHAGEAL ENDOSCOPIC ULTRASOUND (EUS);  Surgeon: Irving Copas., MD;  Location: [facility];  Service: Gastroenterology;  Laterality: N/A;   Current Facility-Administered Medications  Medication Dose Route Frequency Provider Last Rate Last Dose  . 0.9 %  sodium chloride infusion   Intravenous Continuous Mansouraty, Telford Nab., MD       Allergies  Allergen Reactions  . Gluten Meal Other (See Comments)    Stomach distress  . Simvastatin Other (See Comments)    Elevated lfts?   Family History  Problem Relation Age of Onset  . Other Mother        tachycardia.Marland KitchenMarland Kitchen?afib  . Stroke Father        after hernia suegery  . Prostate cancer Father   . Other Father        global transient amnesia, unclear source  . Atrial fibrillation Sister   . Healthy Brother   . Healthy Brother   .  Coronary artery disease Paternal Grandmother   . Heart attack Paternal Grandmother 75  . Brain cancer Maternal Grandfather        ?  Marland Kitchen Cancer Paternal Grandfather        ?  Marland Kitchen Breast cancer Maternal Grandmother    Social History   Socioeconomic History  . Marital status: Married    Spouse name: Not on file  . Number of children: 1  . Years of education: Not on file  . Highest education level: Not on file  Occupational History  . Occupation: Mudlogger of business development  Social Needs  . Financial resource strain: Not on file  . Food insecurity    Worry: Not on file    Inability: Not on file  . Transportation needs    Medical: Not on file    Non-medical: Not on file  Tobacco Use  . Smoking status: Never Smoker  . Smokeless tobacco: Never Used  Substance and Sexual Activity  . Alcohol use: Not Currently    Alcohol/week: 7.0 standard drinks    Types: 7 Glasses of wine per week    Comment: wine  . Drug use: No  . Sexual activity: Not on file  Lifestyle  . Physical activity    Days per week: Not on file    Minutes per session: Not on file  . Stress: Not on file  Relationships  . Social connections  Talks on phone: Not on file    Gets together: Not on file    Attends religious service: Not on file    Active member of club or organization: Not on file    Attends meetings of clubs or organizations: Not on file    Relationship status: Not on file  . Intimate partner violence    Fear of current or ex partner: Not on file    Emotionally abused: Not on file    Physically abused: Not on file    Forced sexual activity: Not on file  Other Topics Concern  . 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 ([employer])works from home mostly when she is not traveling   regular exercise , walking 4-5 times a week   Diet fruits and veggies, water   Alcohol at least several glasses of wine  a week Stopped 08/2018   Never smoker, no drug use    Physical Exam: Vital signs in last 24 hours: Temp:  [98.7 F (37.1 C)] 98.7 F (37.1 C) (09/14 0719) Resp:  [15] 15 (09/14 0719) BP: (144)/(95) 144/95 (09/14 0719) SpO2:  [98 %] 98 % (09/14 0719) Weight:  [74.8 kg] 74.8 kg (09/14 0719)   GEN: NAD EYE: Sclerae anicteric ENT: MMM CV: Non-tachycardic GI: Soft, NT/ND NEURO:  Alert & Oriented x 3  Lab Results: No results for input(s): WBC, HGB, HCT, PLT in the last 72 hours. BMET No results for input(s): NA, K, CL, CO2, GLUCOSE, BUN, CREATININE, CALCIUM in the last 72 hours. LFT No results for input(s): PROT, ALBUMIN, AST, ALT, ALKPHOS, BILITOT, BILIDIR, IBILI in the last 72 hours. PT/INR No results for input(s): LABPROT, INR in the last 72 hours.   Impression / Plan: This is a 58 y.o.female who presents for EGD/EUS.  The risks of EUS including bleeding, infection, aspiration pneumonia and intestinal perforation were discussed as was the possibility it may not give a definitive diagnosis.  If a biopsy of the pancreas is done as part of the EUS, there is an additional risk of pancreatitis at the rate of about 1%.  It was explained that procedure related pancreatitis is typically mild, although can be severe and even life threatening, which is why we do not perform random pancreatic biopsies and only biopsy a lesion we feel is concerning enough to warrant the risk.   The risks and benefits of endoscopic evaluation were discussed with the patient; these include but are not limited to the risk of perforation, infection, bleeding, missed lesions, lack of diagnosis, severe illness requiring hospitalization, as well as anesthesia and sedation related illnesses.  The patient is agreeable to proceed.    Corliss ParishGabriel Mansouraty, MD [facility] Office # [phone] '

## 2019-01-19 NOTE — Op Note (Signed)
[facility] Patient Name: Mallory Stewart Procedure Date : 01/19/2019 MRN: [id number] Attending MD: Justice Britain , MD Date of Birth: 1960/06/08 CSN: [id number] Age: 58 Admit Type: Inpatient Procedure:                Upper EUS Indications:              Pancreatic cyst on CT scan, Abnormal ultrasound of                            the abdomen Providers:                Justice Britain, MD, Carlyn Reichert, RN, Marguerita Merles, Technician Referring MD:             Gatha Mayer, MD, [name] MD, MD Medicines:                Monitored Anesthesia Care Complications:            No immediate complications. Estimated Blood Loss:     Estimated blood loss was minimal. Procedure:                Pre-Anesthesia Assessment:                           - Prior to the procedure, a History and Physical                            was performed, and patient medications and                            allergies were reviewed. The patient's tolerance of                            previous anesthesia was also reviewed. The risks                            and benefits of the procedure and the sedation                            options and risks were discussed with the patient.                            All questions were answered, and informed consent                            was obtained. Prior Anticoagulants: The patient has                            taken no previous anticoagulant or antiplatelet                            agents. ASA Grade Assessment: III - A patient with  severe systemic disease. After reviewing the risks                            and benefits, the patient was deemed in                            satisfactory condition to undergo the procedure.                           After obtaining informed consent, the endoscope was                            passed under direct vision. Throughout the                 procedure, the patient's blood pressure, pulse, and                            oxygen saturations were monitored continuously. The                            [id number]-H190 ([id number]) Olympus gastroscope was                            introduced through the mouth, and advanced to the                            second part of duodenum. The [id number]-Q180V ([id number])                            Olympus duodenoscope was introduced through the                            mouth, and advanced to the second part of duodenum.                            The GF-UCT180 ([id number]) Olympus Linear EUS scope                            was introduced through the mouth, and advanced to                            the duodenum for ultrasound examination from the                            stomach and duodenum. The upper EUS was                            accomplished without difficulty. The patient                            tolerated the procedure. Scope In: Scope Out: Findings:      ENDOSCOPIC FINDING: :      No gross lesions were noted in the proximal esophagus and in the mid  esophagus.      A single scattered island of salmon-colored mucosa was present at 38 cm.       No other visible abnormalities were present. Biopsies were taken with a       cold forceps for histology to rule out Barrett's.      The Z-line was regular and was found 39 cm from the incisors.      Multiple small sessile polyps were found in the gastric body - appearing       to be fundic gland. Biopsies were taken with a cold forceps for       histology to rule out adenoma.      No other gross mucosal lesions were noted in the entire examined stomach.      Localized moderately erythematous mucosa without active bleeding and       with no stigmata of bleeding was found in the D1/D2 sweep. This is       improved from prior with ability to pass the duodenoscope and the linear       echoendoscope into this region. Biopsies were taken  with a cold forceps       for histology.      The major papilla was normal.      ENDOSONOGRAPHIC FINDING: :      Pancreatic parenchymal abnormalities were noted in the entire pancreas.       These consisted of atrophy, hyperechoic foci with shadowing, lobularity       with honeycombing and hyperechoic strands. No evidence of cystic       appearing lesions or masses were noted on today's examination. The       pancreatic duct in the head measured 0.8 mm. The pancreatic duct in the       neck measured 0.8 mm. The pancreatic duct in the body measured 0.9 mm.       The pancreatic duct in the tail measured 1.0 mm. The sensitivity of       finding masses/lesions in chronic pancreatitis via EUS is decreased.      There was dilation in the common bile duct and in the common hepatic       duct. No evidence of choledocholithiasis.      Endosonographic imaging in the visualized portion of the left-lobe of       the liver showed no mass-lesion.      No malignant-appearing lymph nodes were visualized in the celiac region       (level 20), peripancreatic region and porta hepatis region.      The celiac region was visualized. Impression:               EGD Impression:                           - No gross lesions in esophagus.                           - Salmon-colored mucosa. Biopsied.                           - Z-line regular, 39 cm from the incisors.                           - Multiple gastric polyps. Biopsied.                           -  No gross lesions in the stomach.                           - Erythematous duodenopathy. Biopsied.                           - Normal major papilla.                           EUS Impression:                           - Pancreatic parenchymal abnormalities consisting                            of atrophy, hyperechoic foci, lobularity with                            honeycombing and hyperechoic strands were noted in                            the entire pancreas.                            - There was dilation in the common bile duct and in                            the common hepatic duct.                           - No malignant-appearing lymph nodes were                            visualized in the celiac region (level 20),                            peripancreatic region and porta hepatis region. Recommendation:           - The patient will be observed post-procedure,                            until all discharge criteria are met.                           - Discharge patient to home.                           - Observe patient's clinical course.                           - Await path results. If positive for Barrett's                            will need surveillance. If positive for HP then                            will  need treatment. Gastric biopsies had been                            obtained previously.                           - Query use of Pancreatic Enzyme Replacement                            Therapy in patient.                           - May benefit from Carafate for a period in time to                            continue to allow healing of the duodenal                            inflammation that is present while already on BID                            PPI.                           - Consider repeat CTAP-Pancreas Protocol in                            21-month time (623-monthsince last CT scan) to                            ensure no other lesions are noted since in the                            setting of chronic pancreatitis the sensitivity is                            decreased in the finding of pancreatic                            masses/lesions.                           - The findings and recommendations were discussed                            with the patient.                           - The findings and recommendations were discussed                            with the patient's family. Procedure Code(s):         --- Professional ---                           [id number]py, flexible,  transoral; with endoscopic ultrasound examination                            limited to the esophagus, stomach or duodenum, and                            adjacent structures                           43239, Esophagogastroduodenoscopy, flexible,                            transoral; with biopsy, single or multiple Diagnosis Code(s):        --- Professional ---                           K22.8, Other specified diseases of esophagus                           K31.7, Polyp of stomach and duodenum                           K31.89, Other diseases of stomach and duodenum                           K86.9, Disease of pancreas, unspecified                           I89.9, Noninfective disorder of lymphatic vessels                            and lymph nodes, unspecified                           K86.2, Cyst of pancreas                           K83.8, Other specified diseases of biliary tract                           R93.5, Abnormal findings on diagnostic imaging of                            other abdominal regions, including retroperitoneum CPT copyright 2019 American [employer] Association. All rights reserved. The codes documented in this report are preliminary and upon coder review may  be revised to meet current compliance requirements. Justice Britain, MD 01/19/2019 8:58:16 AM Number of Addenda: 0

## 2019-01-19 NOTE — Discharge Instructions (Signed)
YOU HAD AN ENDOSCOPIC PROCEDURE TODAY: Refer to the procedure report and other information in the discharge instructions given to you for any specific questions about what was found during the examination. If this information does not answer your questions, please call [place] office at [phone] to clarify.   YOU SHOULD EXPECT: Some feelings of bloating in the abdomen. Passage of more gas than usual. Walking can help get rid of the air that was put into your GI tract during the procedure and reduce the bloating. If you had a lower endoscopy (such as a colonoscopy or flexible sigmoidoscopy) you may notice spotting of blood in your stool or on the toilet paper. Some abdominal soreness may be present for a day or two, also.  DIET: Your first meal following the procedure should be a light meal and then it is ok to progress to your normal diet. A half-sandwich or bowl of soup is an example of a good first meal. Heavy or fried foods are harder to digest and may make you feel nauseous or bloated. Drink plenty of fluids but you should avoid alcoholic beverages for 24 hours. If you had a esophageal dilation, please see attached instructions for diet.    ACTIVITY: Your care partner should take you home directly after the procedure. You should plan to take it easy, moving slowly for the rest of the day. You can resume normal activity the day after the procedure however YOU SHOULD NOT DRIVE, use power tools, machinery or perform tasks that involve climbing or major physical exertion for 24 hours (because of the sedation medicines used during the test).   SYMPTOMS TO REPORT IMMEDIATELY: A gastroenterologist can be reached at any hour. Please call [phone]  for any of the following symptoms:   Following upper endoscopy (EGD, EUS, ERCP, esophageal dilation) Vomiting of blood or coffee ground material  New, significant abdominal pain  New, significant chest pain or pain under the shoulder blades  Painful or  persistently difficult swallowing  New shortness of breath  Black, tarry-looking or red, bloody stools  FOLLOW UP:  If any biopsies were taken you will be contacted by phone or by letter within the next 1-3 weeks. Call [phone]  if you have not heard about the biopsies in 3 weeks.  Please also call with any specific questions about appointments or follow up tests.  

## 2019-01-19 NOTE — Anesthesia Postprocedure Evaluation (Signed)
Anesthesia Post Note  Patient: Mallory Stewart  Procedure(s) Performed: UPPER ESOPHAGEAL ENDOSCOPIC ULTRASOUND (EUS) (N/A ) ESOPHAGOGASTRODUODENOSCOPY (EGD) WITH PROPOFOL (N/A ) BIOPSY     Patient location during evaluation: PACU Anesthesia Type: MAC Level of consciousness: awake and alert Pain management: pain level controlled Vital Signs Assessment: post-procedure vital signs reviewed and stable Respiratory status: spontaneous breathing, nonlabored ventilation and respiratory function stable Cardiovascular status: stable and blood pressure returned to baseline Anesthetic complications: no    Last Vitals:  Vitals:   01/19/19 0850 01/19/19 0905  BP: 106/74 (!) 145/93  Pulse: 78 79  Resp: 12 16  Temp:    SpO2: 100% 100%    Last Pain:  Vitals:   01/19/19 0905  TempSrc:   PainSc: 0-No pain                 Audry Pili

## 2019-01-19 NOTE — Transfer of Care (Signed)
Immediate Anesthesia Transfer of Care Note  Patient: NEFTALI THUROW  Procedure(s) Performed: UPPER ESOPHAGEAL ENDOSCOPIC ULTRASOUND (EUS) (N/A ) ESOPHAGOGASTRODUODENOSCOPY (EGD) WITH PROPOFOL (N/A ) BIOPSY  Patient Location: PACU and Endoscopy Unit  Anesthesia Type:MAC  Level of Consciousness: awake, alert  and oriented  Airway & Oxygen Therapy: Patient Spontanous Breathing and Patient connected to nasal cannula oxygen  Post-op Assessment: Report given to RN, Post -op Vital signs reviewed and stable and Patient moving all extremities  Post vital signs: Reviewed and stable  Last Vitals:  Vitals Value Taken Time  BP 90/67 01/19/19 0835  Temp    Pulse 87 01/19/19 0839  Resp 15 01/19/19 0839  SpO2 100 % 01/19/19 0839  Vitals shown include unvalidated device data.  Last Pain:  Vitals:   01/19/19 0835  TempSrc:   PainSc: 0-No pain         Complications: No apparent anesthesia complications

## 2019-01-20 ENCOUNTER — Encounter (HOSPITAL_COMMUNITY): Payer: Self-pay | Admitting: Gastroenterology

## 2019-01-27 ENCOUNTER — Other Ambulatory Visit: Payer: Self-pay

## 2019-01-27 ENCOUNTER — Encounter: Payer: Self-pay | Admitting: Gastroenterology

## 2019-01-29 ENCOUNTER — Ambulatory Visit: Payer: 59 | Admitting: Internal Medicine

## 2019-01-29 DIAGNOSIS — Z0289 Encounter for other administrative examinations: Secondary | ICD-10-CM

## 2019-01-29 NOTE — Progress Notes (Deleted)
Name: Mallory Stewart  Age/ Sex: 58 y.o., female   MRN/ DOB: [id number], [birth year]     PCP: [name], MD   Reason for Endocrinology Evaluation: Type 2 Diabetes Mellitus     Initial Endocrinology Clinic Visit: 08/22/2018    PATIENT IDENTIFIER: Mallory Stewart is a 59 y.o. female with a past medical history of HTN, T2DM, chronic pancreatitis and gastric outlet obstruction . The patient has followed with Endocrinology clinic since 08/22/2018 for consultative assistance with management of her diabetes.  DIABETIC HISTORY:  Mallory Stewart was diagnosed with T2DM in 2010.Pt had hospital admission in 2014 for pancreatitis secondary to ETOH intake, and another one in 2018 secondary to Gallstones but she al;so had binged the prior day on ETOH intake. Insulin started in 07/2018 . Her hemoglobin A1c has ranged from 5.4% in 2016, peaking at 13.3%  In 2020.  Prandial insulin 08/2018  SUBJECTIVE:   During the last visit (10/03/2018): We continued [place] 28 units, she was started on Farxiga and advised to stop the 4 units of Novolog daily .   Today (01/29/2019): Mallory Stewart is here for a 4 month followup on diabetes management.  She checks her blood sugars 3-4 times daily, preprandial and bedtime. The patient has had hypoglycemic episodes since the last clinic visit, which typically occur 2 x /week - most often occuring after lunch . The patient is symptomatic with these episodes, with symptoms of shaking. Otherwise, the patient has not required any recent emergency interventions for hypoglycemia and has not had recent hospitalizations secondary to hyper or hypoglycemic episodes.    ROS: As per HPI and as detailed below: Review of Systems  Respiratory: Negative for cough and shortness of breath.   Cardiovascular: Negative for chest pain and palpitations.  Gastrointestinal: Negative for diarrhea and nausea.  Genitourinary: Negative for frequency.  Endo/Heme/Allergies: Negative for  polydipsia.      HOME DIABETES REGIMEN:   Tresiba 28  units Daily - has been taking    Novolog 4 units TID QAC  Farxiga 5 mg      GLUCOSE LOG:  Date Breakfast  Lunch  Dinner Bedtime  10/03/18 97     5/28 153 130 126   5/27 135 108 109 125  5/26 167 250 120 114       HISTORY:  Past Medical History:  Past Medical History:  Diagnosis Date  . Alcohol-induced chronic pancreatitis ([place])   . B12 deficiency   . Concussion   . DDD (degenerative disc disease), cervical   . Diabetes ([place])   . DKA, type 2 ([place])   . Hypertension   . Neuropathy   . Seasonal allergies    Past Surgical History:  Past Surgical History:  Procedure Laterality Date  . BIOPSY  01/19/2019   Procedure: BIOPSY;  Surgeon: Rush Landmark Telford Nab., MD;  Location: [place];  Service: Gastroenterology;;  . CHOLECYSTECTOMY N/A 07/28/2016   Procedure: LAPAROSCOPIC CHOLECYSTECTOMY WITH INTRAOPERATIVE CHOLANGIOGRAM;  Surgeon: Mickeal Skinner, MD;  Location: [place] [facility];  Service: General;  Laterality: N/A;  . ESOPHAGOGASTRODUODENOSCOPY (EGD) WITH PROPOFOL N/A 08/13/2018   Procedure: ESOPHAGOGASTRODUODENOSCOPY (EGD) WITH PROPOFOL;  Surgeon: Irving Copas., MD;  Location: [place];  Service: Gastroenterology;  Laterality: N/A;  . ESOPHAGOGASTRODUODENOSCOPY (EGD) WITH PROPOFOL N/A 01/19/2019   Procedure: ESOPHAGOGASTRODUODENOSCOPY (EGD) WITH PROPOFOL;  Surgeon: Rush Landmark Telford Nab., MD;  Location: [place];  Service: Gastroenterology;  Laterality: N/A;  . LEEP  1990's  . ORIF FOOT FRACTURE  09/2008   L 5th metatarsal  . REFRACTIVE SURGERY  2000  . TONSILLECTOMY    . UPPER ESOPHAGEAL ENDOSCOPIC ULTRASOUND (EUS) N/A 08/13/2018   Procedure: UPPER ESOPHAGEAL ENDOSCOPIC ULTRASOUND (EUS);  Surgeon: Irving Copas., MD;  Location: [place];  Service: Gastroenterology;  Laterality: N/A;  . UPPER ESOPHAGEAL ENDOSCOPIC ULTRASOUND (EUS) N/A 01/19/2019   Procedure: UPPER ESOPHAGEAL ENDOSCOPIC  ULTRASOUND (EUS);  Surgeon: Irving Copas., MD;  Location: [place];  Service: Gastroenterology;  Laterality: N/A;    Social History:  reports that she has never smoked. She has never used smokeless tobacco. She reports previous alcohol use of about 7.0 standard drinks of alcohol per week. She reports that she does not use drugs. Family History:  Family History  Problem Relation Age of Onset  . Other Mother        tachycardia.Marland KitchenMarland Kitchen?afib  . Stroke Father        after hernia suegery  . Prostate cancer Father   . Other Father        global transient amnesia, unclear source  . Atrial fibrillation Sister   . Healthy Brother   . Healthy Brother   . Coronary artery disease Paternal Grandmother   . Heart attack Paternal Grandmother 21  . Brain cancer Maternal Grandfather        ?  Marland Kitchen Cancer Paternal Grandfather        ?  Marland Kitchen Breast cancer Maternal Grandmother      HOME MEDICATIONS: Allergies as of 01/29/2019      Reactions   Gluten Meal Other (See Comments)   Stomach distress   Simvastatin Other (See Comments)   Elevated lfts?      Medication List       Accurate as of January 29, 2019  7:31 AM. If you have any questions, ask your nurse or doctor.        vitamin B-12 500 MCG tablet Commonly known as: CYANOCOBALAMIN Take 1,000 mcg by mouth daily.   B-12 1000 MCG Tbcr Take 2,000 mcg by mouth daily.   Farxiga 10 MG Tabs tablet Generic drug: dapagliflozin propanediol Take 10 mg by mouth daily before breakfast.   fluticasone 50 MCG/ACT nasal spray Commonly known as: FLONASE Place 2 sprays into both nostrils every morning.   glucose blood test strip Commonly known as: ONE TOUCH ULTRA TEST CHECK BLOOD SUGAR TWO TIMES DAILY AS DIRECTED   levocetirizine 5 MG tablet Commonly known as: XYZAL Take 5 mg by mouth at bedtime.   lisinopril 40 MG tablet Commonly known as: ZESTRIL TAKE 1 TABLET BY MOUTH EVERY DAY   metoprolol tartrate 50 MG tablet Commonly known  as: LOPRESSOR TAKE 1 TABLET BY MOUTH TWICE A DAY   multivitamin with minerals Tabs tablet Take 1 tablet by mouth daily.   ONE TOUCH ULTRA 2 w/Device Kit Use to check blood sugars two times day   onetouch ultrasoft lancets Use to check blood sugar two times a day.   pantoprazole 40 MG tablet Commonly known as: PROTONIX Take 1 tablet (40 mg total) by mouth 2 (two) times daily before a meal.   Pen Needles 31G X 5 MM Misc 1 application by Does not apply route 4 (four) times daily.   Tyler Aas FlexTouch 200 UNIT/ML Sopn Generic drug: Insulin Degludec INJECT 40 UNITS INTO THE SKIN EVERY MORNING. What changed: See the new instructions.       PHYSICAL EXAM: VS: There were no vitals taken for this visit.   EXAM: General: Pt appears  well and is in NAD  Neck: General: Supple without adenopathy. Thyroid: Thyroid size normal.  No goiter or nodules appreciated. No thyroid bruit.  Lungs: Clear with good BS bilat with no rales, rhonchi, or wheezes  Heart: Auscultation: RRR with normal S1 and S2, no gallops or murmurs Carotid arteries: no bruits Periph. circulation: no peripheral edema  Abdomen: Normoactive bowel sounds, soft, nontender, without masses or organomegaly palpable  Extremities:  BL LE: no pretibial edema  Neuro: Cranial nerves: II - XII grossly intact ;  Motor: normal strength throughout  Mental Status: Judgment, insight: intact Orientation: oriented to time, place, and person Mood and affect: no depression, anxiety, or agitation     DATA REVIEWED:  Lab Results  Component Value Date   HGBA1C 7.0 (H) 10/16/2018   HGBA1C 13.3 (H) 07/23/2018   HGBA1C 9.2 (H) 12/30/2017   Lab Results  Component Value Date   LDLCALC 82 10/16/2018   CREATININE 1.28 (H) 10/28/2018     Lab Results  Component Value Date   CHOL 201 (H) 10/16/2018   HDL 104.50 10/16/2018   LDLCALC 82 10/16/2018   LDLDIRECT 160.5 02/20/2012   TRIG 70.0 10/16/2018   CHOLHDL 2 10/16/2018          ASSESSMENT / PLAN / RECOMMENDATIONS:   1) Type 2 Diabetes Mellitus, Poorly controlled, In the setting of Chronic Pancreatitis Withneuropathic complications - Most recent A1c of 13.3 %. Goal A1c < 7.0 %.    Plan:  - Her BG's have improved a great deal. I have congratulated her on the hard work, compliance with glucose checks, taking medications and continuing with lifestyle changes.  - She is currently on 6 units of Novolog and continues to have hypoglycemia. We discussed oral glycemic agents to be able to taper off prandial insulin  - She intolerant to metformin and would like not not try it again. Due to hx of pancreatitis, she is not a candidate for DPP-4 inhibitors nor GLP-1 agonists. We discussed SU and SGLT-2 inhibitors.  - She is interested in SGLT_2 inhibitors, we discussed risk of genital infections, we discussed benefits to include cardiovascular, glucose and weight.  - Once she starts on SGLT-2 inhibitors, she could stop Novolog but continue tresiba, but until then, she can use novolog 4 units with each meal      MEDICATIONS:  Continue Tresiba 28 unirs daily   Stop Novolog once Wilder Glade is started, but until then use 4 units TODQAC   Farxiga 5 mg daily   EDUCATION / INSTRUCTIONS:  BG monitoring instructions: Patient is instructed to check her blood sugars 4 times a day, before meals and bedtime.  Call [facility] if: BG persistently < 70 or > 300. . I reviewed the Rule of 15 for the treatment of hypoglycemia in detail with the patient. Literature supplied.       F/U in 8 weeks    Signed electronically by: Mack Guise, MD  [facility]  [facility] [address] [place], [place] [address] Phone: [phone] FAX: [phone]   CC: [name], MD [place] [address] Phone: [phone]  Fax: [phone]  Return to Endocrinology clinic as below: Future  Appointments  Date Time Provider [place]  01/29/2019  9:50 AM Mariany Mackintosh, Melanie Crazier, MD [id number] None

## 2019-01-30 ENCOUNTER — Telehealth: Payer: Self-pay | Admitting: Gastroenterology

## 2019-01-30 NOTE — Telephone Encounter (Signed)
01/27/2019 MRN: [id number]  Mallory Stewart [address] [place] [address]  Dear Ms. Aldana,  I am writing to inform you that the biopsies taken during your recent endoscopic examination showed:   Diagnosis 1. Duodenum, Biopsy - PEPTIC DUODENITIS. - THERE IS NO EVIDENCE OF HELICOBACTER PYLORI, SIGNIFICANT VILLOUS ATROPHY, DYSPLASIA OR MALIGNANCY. - SEE COMMENT. 2. Esophagus, biopsy, distal - MINIMALLY INFLAMED GASTROESOPHAGEAL JUNCTIONAL MUCOSA. - THERE IS NO EVIDENCE GOBLET CELL METAPLASIA, DYSPLASIA OR MALIGNANCY. 3. Stomach, polyp(s) - BENIGN POLYPOID GASTRIC-TYPE MUCOSA. - THERE IS NO EVIDENCE OF MALIGNANCY. Microscopic Comment 1. A Warthin-Starry stain is performed to determine the possibility of the presence of Helicobacter pylori. The Warthin-Starry stain is negative for organisms of Helicobacter pylori.    What this is all mean? Your small intestine biopsies show evidence of persistent inflammation however there is no evidence of H. pylori or precancerous changes. Your stomach polyps returned showing benign stomach tissue so there is no need for follow-up of this particular area. Your esophagus biopsies at the end of your esophagus showed evidence of inflamed GE junction tissue (the transition from esophagus to stomach) but no evidence of Barrett's esophagus or other precancerous changes.  Overall this is good news. As we discussed after your procedure the area in your small bowel continues to have inflammation as is evidence endoscopically as well as with biopsies however the area is much less swollen than it was previously.  You must continue your acid reducing medications.  Dr. Carlean Purl will consider in the future the use of Carafate and possibly pancreas enzyme replacement therapy. I do recommend that we consider a repeat CT scan in 3 months (this will be 6 months from your last one). Please follow-up with Dr. Carlean Purl and I will be available as needed  in the future.   Please call us at Dept: [phone] if you have persistent problems or have questions about your condition that have not been fully answered at this time.  Sincerely,  Irving Copas., MD                                The pt has been advised by message that a letter was mailed with results and if she would like to call back we can go over the information in the letter

## 2019-02-22 ENCOUNTER — Other Ambulatory Visit: Payer: Self-pay | Admitting: Family Medicine

## 2019-02-23 NOTE — Telephone Encounter (Signed)
Patient called back and stated that she is taking 34 units of [place]

## 2019-02-23 NOTE — Telephone Encounter (Signed)
Left message for [name] to return call to verify units of Mallory Stewart she is currently taking.

## 2019-03-17 ENCOUNTER — Other Ambulatory Visit: Payer: Self-pay | Admitting: Family Medicine

## 2019-04-06 ENCOUNTER — Other Ambulatory Visit: Payer: Self-pay | Admitting: Family Medicine

## 2019-04-06 ENCOUNTER — Other Ambulatory Visit: Payer: Self-pay

## 2019-04-06 MED ORDER — PANTOPRAZOLE SODIUM 40 MG PO TBEC: 40.0000 mg | DELAYED_RELEASE_TABLET | Freq: Two times a day (BID) | ORAL | 1 refills | Status: DC

## 2019-04-06 NOTE — Telephone Encounter (Signed)
Pantoprazole refilled as pharmacy requested. 

## 2019-04-08 ENCOUNTER — Telehealth: Payer: Self-pay

## 2019-04-08 DIAGNOSIS — K860 Alcohol-induced chronic pancreatitis: Secondary | ICD-10-CM

## 2019-04-08 NOTE — Telephone Encounter (Signed)
Left message for patient to call back To discuss repeat CT

## 2019-04-08 NOTE — Telephone Encounter (Signed)
-----   Message from Marlon Pel, RN sent at 03/23/2019  5:01 PM EST ----- Regarding: FW: CT scan in December  ----- Message ----- From: Marlon Pel, RN Sent: 03/22/2019 To: Marlon Pel, RN Subject: FW: CT scan in December                         ----- Message ----- From: Gatha Mayer, MD Sent: 02/16/2019   2:39 PM EDT To: Marlon Pel, RN Subject: CT scan in December                            The patient needs a CT scan abd/pelvis w/ contrast to f/u pancreatitis - mid december  Let us do a pancreatic protocol CT  When you call her about this please ask her how she is doing and if we need to set up an OV now as opposed to after CT

## 2019-04-09 NOTE — Telephone Encounter (Signed)
Patient has been scheduled for 04/22/19 10:30 am at [facility].  She understands to come pick up the contrast and instructions and get her labs prior to this date.   Dr. Carlean Purl per rad tech pancreatic protocol is for pancreatic lesions not follow up pancreatitis.  Are you looking for a lesion?

## 2019-04-09 NOTE — Telephone Encounter (Signed)
There was a concern for mass in the past which is why I asked for pancreatic protocol  She has chronic pancreatitis and in may there was a possible mass - so that is why  I will defer to expertise of tech/radiologists re; method of CT

## 2019-04-09 NOTE — Addendum Note (Signed)
Addended by: Marlon Pel on: 04/09/2019 04:29 PM   Modules accepted: Orders

## 2019-04-17 ENCOUNTER — Telehealth: Payer: Self-pay | Admitting: Internal Medicine

## 2019-04-17 NOTE — Telephone Encounter (Signed)
Patient provided the number to CT to reschedule at her convenience

## 2019-04-17 NOTE — Telephone Encounter (Signed)
Pt requested to reschedule CT to January.

## 2019-04-22 ENCOUNTER — Other Ambulatory Visit: Payer: 59

## 2019-04-23 ENCOUNTER — Other Ambulatory Visit: Payer: 59

## 2019-05-20 ENCOUNTER — Inpatient Hospital Stay: Admission: RE | Admit: 2019-05-20 | Payer: 59 | Source: Ambulatory Visit

## 2019-05-22 ENCOUNTER — Telehealth: Payer: Self-pay | Admitting: Family Medicine

## 2019-05-22 NOTE — Telephone Encounter (Signed)
[name] notified as instructed by telephone.  Phone number to schedule vaccine given so when she is able to schedule, she will already have the number.

## 2019-05-22 NOTE — Telephone Encounter (Signed)
Patient called in regards to the covid vaccine  She stated that she is not quite 67 but wanted to know due to her health if she would be eligible to get the vaccine now    Please advise '

## 2019-05-22 NOTE — Telephone Encounter (Signed)
I don't believe we have moved to her group yet.

## 2019-06-01 ENCOUNTER — Other Ambulatory Visit (INDEPENDENT_AMBULATORY_CARE_PROVIDER_SITE_OTHER): Payer: 59

## 2019-06-01 ENCOUNTER — Telehealth: Payer: Self-pay | Admitting: Internal Medicine

## 2019-06-01 DIAGNOSIS — K860 Alcohol-induced chronic pancreatitis: Secondary | ICD-10-CM

## 2019-06-01 LAB — BUN: BUN: 29 mg/dL — ABNORMAL HIGH (ref 6–23)

## 2019-06-01 LAB — CREATININE, SERUM: Creatinine, Ser: 1.33 mg/dL — ABNORMAL HIGH (ref 0.40–1.20)

## 2019-06-01 NOTE — Telephone Encounter (Signed)
I called and left a message telling her to hold off on the CT scan and we would reschedule it.  Let's do it in 3 weeks  Please discuss w/ her.

## 2019-06-01 NOTE — Telephone Encounter (Signed)
Left message for the patient to call back.

## 2019-06-01 NOTE — Telephone Encounter (Signed)
Patient returned your call, please call patient one more time.   

## 2019-06-01 NOTE — Telephone Encounter (Signed)
Dr. Leone Payor, please advise.  The patient is scheduled for a CT scan tomorrow but states she is positive she had a pancreatitis flare last week. She expresses concern about having a scan so close to the time frame of her flare. She does not want the test to be negatively "skewed" being that she has been without problems for a while. She wants to know if it is best for her to push her CT scan further out or proceed tomorrow as planned?

## 2019-06-02 ENCOUNTER — Inpatient Hospital Stay: Admission: RE | Admit: 2019-06-02 | Payer: 59 | Source: Ambulatory Visit

## 2019-06-02 NOTE — Telephone Encounter (Signed)
Left detailed message. CT rescheduled at [facility] at 9 am on 06/23/19. Bottle 1 of contrast at 7 am, bottle 2 at 8 am.

## 2019-06-16 ENCOUNTER — Other Ambulatory Visit: Payer: Self-pay | Admitting: Family Medicine

## 2019-06-23 ENCOUNTER — Other Ambulatory Visit: Payer: Self-pay

## 2019-06-23 ENCOUNTER — Ambulatory Visit (INDEPENDENT_AMBULATORY_CARE_PROVIDER_SITE_OTHER)
Admission: RE | Admit: 2019-06-23 | Discharge: 2019-06-23 | Disposition: A | Discharge status: Home / Self Care | Payer: 59 | Source: Ambulatory Visit | Attending: Internal Medicine | Admitting: Internal Medicine

## 2019-06-23 DIAGNOSIS — K860 Alcohol-induced chronic pancreatitis: Secondary | ICD-10-CM | POA: Diagnosis not present

## 2019-06-23 MED ORDER — IOHEXOL 300 MG/ML  SOLN
100.0000 mL | Freq: Once | INTRAMUSCULAR | Status: AC | PRN
  Administered 2019-06-23: 09:00:00 100 mL via INTRAVENOUS

## 2019-07-08 ENCOUNTER — Other Ambulatory Visit: Payer: Self-pay | Admitting: Internal Medicine

## 2019-07-23 ENCOUNTER — Ambulatory Visit: Payer: 59 | Admitting: Internal Medicine

## 2019-07-29 ENCOUNTER — Ambulatory Visit: Payer: 59 | Admitting: Family Medicine

## 2019-07-30 ENCOUNTER — Ambulatory Visit: Payer: 59 | Admitting: Family Medicine

## 2019-07-30 ENCOUNTER — Other Ambulatory Visit: Payer: Self-pay

## 2019-07-30 ENCOUNTER — Encounter: Payer: Self-pay | Admitting: Family Medicine

## 2019-07-30 VITALS — BP 118/80 | HR 79 | Temp 97.6°F | Ht 68.25 in | Wt 192.5 lb

## 2019-07-30 DIAGNOSIS — M7502 Adhesive capsulitis of left shoulder: Secondary | ICD-10-CM | POA: Diagnosis not present

## 2019-07-30 MED ORDER — METHYLPREDNISOLONE ACETATE 40 MG/ML IJ SUSP
80.0000 mg | Freq: Once | INTRAMUSCULAR | Status: AC
  Administered 2019-07-30: 80 mg via INTRA_ARTICULAR

## 2019-07-30 NOTE — Progress Notes (Signed)
Mallory Rewerts T. Cline Draheim, MD Primary Care and [place] at [facility] [place], [address] Phone: [phone]  FAX: [place] - 59 y.o. female  MRN [id number]  Date of Birth: [birth year]  Visit Date: 07/30/2019  PCP: [name], MD  Referred by: [name], MD  Chief Complaint  Patient presents with  . Shoulder Pain    Left    This visit occurred during the SARS-CoV-2 public health emergency.  Safety protocols were in place, including screening questions prior to the visit, additional usage of staff PPE, and extensive cleaning of exam room while observing appropriate contact time as indicated for disinfecting solutions.   Subjective:   Mallory Stewart is a 59 y.o. very pleasant female patient who presents with the following: shoulder pain  The patient noted above presents with shoulder pain that has been ongoing for L there is no history of trauma or accident. The patient denies neck pain or radicular symptoms. No shoulder blade pain Denies dislocation, subluxation, separation of the shoulder. The patient does complain of pain with flexion, abduction, and terminal motion.  Significant restriction of motion. she describes a deep ache around the shoulder, and sometimes it will wake the patient up at night.  She presents with some ongoing left-sided shoulder pain.  I have seen her before last approximately 7 months ago.  I previously did get an MRI of her shoulder for concern about a rotator cuff tear, and this was grossly unremarkable with supraspinatus tendinopathy only.  A couple of months has had some pain.  Has a new puppy.  Flung her dog's tick and had motion limit motion.   l intra should  Medications Tried: otc Ice or Heat: minimal help Tried PT: No  Prior shoulder Injury: prior R shoulder pain Prior surgery: No Prior fracture: No   Review of Systems is noted in the HPI, as  appropriate  Objective:   Blood pressure 118/80, pulse 79, temperature 97.6 F (36.4 C), temperature source Temporal, height 5' 8.25" (1.734 m), weight 192 lb 8 oz (87.3 kg), SpO2 97 %.   GEN: No acute distress; alert,appropriate. PULM: Breathing comfortably in no respiratory distress PSYCH: Normally interactive.   Shoulder: R and L Inspection: No muscle wasting or winging Ecchymosis/edema: neg  AC joint, scapula, clavicle: NT Cervical spine: NT, full ROM Spurling's: neg ABNORMAL SIDE TESTED: L UNLESS OTHERWISE NOTED, THE CONTRALATERAL SIDE HAS FULL RANGE OF MOTION. Abduction: 5/5, LIMITED TO 110 DEGREES Flexion: 5/5, LIMITED TO 125 DEGNO ROM  IR, lift-off: 5/5. TESTED AT 90 DEGREES OF ABDUCTION, LIMITED TO 5 DEGREES ER at neutral:  5/5, TESTED AT 90 DEGREES OF ABDUCTION, LIMITED TO 20 DEGREES AC crossover and compression: PAIN Drop Test: neg Empty Can: neg Supraspinatus insertion: NT Bicipital groove: NT ALL OTHER SPECIAL TESTING EQUIVOCAL GIVEN LOSS OF MOTION C5-T1 intact Sensation intact Grip 5/5   Assessment and Plan:     ICD-10-CM   1. Adhesive capsulitis of left shoulder  M75.02     Level of Medical Decision-Making in this case is MODERATE.  Patient was given a systematic ROM protocol from [facility] to be done daily. Emphasized importance of adherence, help of PT, daily HEP.  The average length of total symptoms is 12-18 months going through 3 different phases in the freezing and thawing process. Reviewed all with patient.   Tylenol or NSAID of choice prn for pain relief Intraarticular shoulder injections discussed with  patient, which have good evidence for accelerating the thawing phase.  Patient will be sent for formal PT for aggressive frozen shoulder ROM if sx persist. Will need RTC str and scapular stabilization to fix underlying mechanics.  I appreciate the opportunity to evaluate this very friendly patient. If you have any question regarding her care or  prognosis, do not hesitate to ask.  Intraarticular Shoulder Aspiration/Injection Procedure Note JOSSILYN BENDA [birth year] Date of procedure: 07/30/2019  Procedure: Large Joint Aspiration / Injection of Shoulder, Intraarticular, L Indications: Pain  Procedure Details Verbal consent was obtained from the patient. Risks including infection explained and contrasted with benefits and alternatives. Patient prepped with Betadine x 3 and Ethyl Chloride used for anesthesia. An intraarticular shoulder injection was performed using the posterior approach; needle placed into joint capsule without difficulty. The patient tolerated the procedure well and had decreased pain post injection. No complications. Injection: 8 cc of Lidocaine 1% and 2 mL Depo-Medrol 40 mg. Needle: 21 gauge, 2 inch   Follow-up: Return in about 10 weeks (around 10/08/2019).  No orders of the defined types were placed in this encounter.  There are no discontinued medications. No orders of the defined types were placed in this encounter.   Signed,  Maud Deed. Sheritha Louis, MD   Outpatient Encounter Medications as of 07/30/2019  Medication Sig  . Blood Glucose Monitoring Suppl (ONE TOUCH ULTRA 2) w/Device KIT Use to check blood sugars two times day  . Cyanocobalamin (B-12) 1000 MCG TBCR Take 2,000 mcg by mouth daily.  . dapagliflozin propanediol (FARXIGA) 10 MG TABS tablet Take 10 mg by mouth daily before breakfast.  . fluticasone (FLONASE) 50 MCG/ACT nasal spray Place 2 sprays into both nostrils every morning.  Marland Kitchen glucose blood (ONE TOUCH ULTRA TEST) test strip CHECK BLOOD SUGAR TWO TIMES DAILY AS DIRECTED  . Insulin Degludec (TRESIBA FLEXTOUCH) 200 UNIT/ML SOPN Inject 34 Units as directed every morning.  . Insulin Pen Needle (PEN NEEDLES) 31G X 5 MM MISC 1 application by Does not apply route 4 (four) times daily.  . Lancets (ONETOUCH ULTRASOFT) lancets Use to check blood sugar two times a day.  . levocetirizine (XYZAL) 5 MG  tablet Take 5 mg by mouth at bedtime.  Marland Kitchen lisinopril (ZESTRIL) 40 MG tablet TAKE 1 TABLET BY MOUTH EVERY DAY  . metoprolol tartrate (LOPRESSOR) 50 MG tablet TAKE 1 TABLET BY MOUTH TWICE A DAY  . Multiple Vitamin (MULTIVITAMIN WITH MINERALS) TABS tablet Take 1 tablet by mouth daily.  . pantoprazole (PROTONIX) 40 MG tablet TAKE 1 TABLET (40 MG TOTAL) BY MOUTH 2 (TWO) TIMES DAILY BEFORE A MEAL.  . vitamin B-12 (CYANOCOBALAMIN) 500 MCG tablet Take 1,000 mcg by mouth daily.   No facility-administered encounter medications on file as of 07/30/2019.

## 2019-07-30 NOTE — Addendum Note (Signed)
Addended by: Damita Lack on: 07/30/2019 09:50 AM   Modules accepted: Orders

## 2019-08-01 ENCOUNTER — Other Ambulatory Visit: Payer: Self-pay | Admitting: Internal Medicine

## 2019-08-13 ENCOUNTER — Other Ambulatory Visit: Payer: Self-pay

## 2019-08-13 ENCOUNTER — Telehealth: Payer: Self-pay | Admitting: Internal Medicine

## 2019-08-13 NOTE — Telephone Encounter (Signed)
MEDICATION: farxiga  PHARMACY:   [id number] - [place], [place] - [address] Phone:  [phone]  Fax:  [phone]       IS THIS A 90 DAY SUPPLY : yes  IS PATIENT OUT OF MEDICATION: no  IF NOT; HOW MUCH IS LEFT: couple days left  LAST APPOINTMENT DATE: 10/03/18  NEXT APPOINTMENT DATE: 08/17/2019  DO WE HAVE YOUR PERMISSION TO LEAVE A DETAILED MESSAGE: yes  OTHER COMMENTS:    **Let patient know to contact pharmacy at the end of the day to make sure medication is ready. **  ** Please notify patient to allow 48-72 hours to process**  **Encourage patient to contact the pharmacy for refills or they can request refills through [facility]**

## 2019-08-14 NOTE — Telephone Encounter (Signed)
Will r/f at pt appt in 08/17/2019

## 2019-08-17 ENCOUNTER — Telehealth: Payer: Self-pay | Admitting: Internal Medicine

## 2019-08-17 ENCOUNTER — Ambulatory Visit: Payer: 59 | Admitting: Internal Medicine

## 2019-08-17 NOTE — Telephone Encounter (Signed)
Have you contacted your pharmacy to initiate the refill request? No - but she said she always gets a call from them when they get it and she hasn't got a call. (she did say she would call them when we got off the phone but I didn't see a refill sent since 6 mo ago)          If no then please ask them to call the pharmacy to start the request but if they insist proceed to next questions  What is the name of the medication/medications? Marcelline Deist        If it is for test strips or lancets please confirm the brand  Is this for a 90 day? "whatever it was last time"  What is the name and location of the pharmacy you would like to use?    [id number] [place] [facility], [place] - [address] Phone:  [phone]  Fax:  [phone]

## 2019-08-17 NOTE — Telephone Encounter (Signed)
Pt had appt for today and cancelled and rescheduled to 08/19/2019. Refills have been denied because pt needs appt as documented in her chart. Will send refill on 08/19/2019 during appt if she does not cancel.

## 2019-08-18 NOTE — Progress Notes (Signed)
Name: Mallory Stewart  Age/ Sex: 59 y.o., female   MRN/ DOB: [id number], 1960/12/04     PCP: [name], MD   Reason for Endocrinology Evaluation: Type 2 Diabetes Mellitus  Initial Endocrine Consultative Visit: 08/22/2018    PATIENT IDENTIFIER: Mallory Stewart is a 59 y.o. female with a past medical history of HTN, T2DM , chronic pancreatitis and gastric outlet obstruction. The patient has followed with Endocrinology clinic since 08/22/2018 for consultative assistance with management of her diabetes.  DIABETIC HISTORY:  Mallory Stewart was with  T2DM in 2010.Pt had hospital admission in 2014 for pancreatitis secondary to ETOH intake, with another one in 2018 and 2020. Insulin started in 07/2018 . Her hemoglobin A1c has ranged from 5.4%in 2016, peaking at13.3%In 2020.  Prandial insulin started 08/2018 Wilder Glade added in 09/2018  SUBJECTIVE:   During the last visit (10/03/2018): this was a virtual visit , we continued tresiba and added [place]  Today (08/19/2019): Mallory Stewart is here for a follow up on diabetes management. She has not been here since 09/2018.  She checks her blood sugars 1 times daily. The patient has  had hypoglycemic episodes since the last clinic visit, which typically occur 1x /week - most often occuring during the day ( mid-late afternoon ). The patient is symptomatic with these episodes, with symptoms of sweating and shaking . Otherwise, the patient has not required any recent emergency interventions for hypoglycemia but was admitted for alcohol induced pancreatitis 01/2019     HOME DIABETES REGIMEN:  Tyler Aas 34 units daily  Farxiga - ran out last week   Statin: No ACE-I/ARB: yes    METER DOWNLOAD SUMMARY: Did not bring     DIABETIC COMPLICATIONS: Microvascular complications:   Neuropathic   Denies: CKD  Last Eye Exam: Completed 2018  Macrovascular complications:    Denies: CAD, CVA, PVD   HISTORY:  Past Medical History:  Past Medical History:  Diagnosis Date  . Alcohol-induced chronic pancreatitis ([place])   . B12 deficiency   . Concussion   . DDD (degenerative disc disease), cervical   . Diabetes ([place])   . DKA, type 2 ([place])   . Hypertension   . Neuropathy   . Seasonal allergies    Past Surgical History:  Past Surgical History:  Procedure Laterality Date  . BIOPSY  01/19/2019   Procedure: BIOPSY;  Surgeon: Rush Landmark Telford Nab., MD;  Location: [place];  Service: Gastroenterology;;  . CHOLECYSTECTOMY N/A 07/28/2016   Procedure: LAPAROSCOPIC CHOLECYSTECTOMY WITH INTRAOPERATIVE CHOLANGIOGRAM;  Surgeon: Mickeal Skinner, MD;  Location: [place];  Service: General;  Laterality: N/A;  . ESOPHAGOGASTRODUODENOSCOPY (EGD) WITH PROPOFOL N/A 08/13/2018   Procedure: ESOPHAGOGASTRODUODENOSCOPY (EGD) WITH PROPOFOL;  Surgeon: Irving Copas., MD;  Location: [place];  Service: Gastroenterology;  Laterality: N/A;  . ESOPHAGOGASTRODUODENOSCOPY (EGD) WITH PROPOFOL N/A 01/19/2019   Procedure: ESOPHAGOGASTRODUODENOSCOPY (EGD) WITH PROPOFOL;  Surgeon: Rush Landmark Telford Nab., MD;  Location: [place];  Service: Gastroenterology;  Laterality: N/A;  . LEEP  1990's  . ORIF FOOT FRACTURE  09/2008   L 5th metatarsal  . REFRACTIVE SURGERY  2000  . TONSILLECTOMY    . UPPER ESOPHAGEAL ENDOSCOPIC ULTRASOUND (EUS) N/A 08/13/2018   Procedure: UPPER ESOPHAGEAL ENDOSCOPIC ULTRASOUND (EUS);  Surgeon: Irving Copas., MD;  Location: [place];  Service: Gastroenterology;  Laterality: N/A;  . UPPER ESOPHAGEAL ENDOSCOPIC ULTRASOUND (EUS) N/A 01/19/2019   Procedure: UPPER ESOPHAGEAL ENDOSCOPIC ULTRASOUND (EUS);  Surgeon: Rush Landmark Telford Nab., MD;  Location: [place];  Service:  Gastroenterology;  Laterality: N/A;    Social History:  reports that she has never smoked. She has never used smokeless tobacco. She reports previous alcohol use of about  7.0 standard drinks of alcohol per week. She reports that she does not use drugs. Family History:  Family History  Problem Relation Age of Onset  . Other Mother        tachycardia.Marland KitchenMarland Kitchen?afib  . Stroke Father        after hernia suegery  . Prostate cancer Father   . Other Father        global transient amnesia, unclear source  . Atrial fibrillation Sister   . Healthy Brother   . Healthy Brother   . Coronary artery disease Paternal Grandmother   . Heart attack Paternal Grandmother 91  . Brain cancer Maternal Grandfather        ?  Marland Kitchen Cancer Paternal Grandfather        ?  Marland Kitchen Breast cancer Maternal Grandmother      HOME MEDICATIONS: Allergies as of 08/19/2019      Reactions   Gluten Meal Other (See Comments)   Stomach distress   Simvastatin Other (See Comments)   Elevated lfts?      Medication List       Accurate as of August 19, 2019  9:26 AM. If you have any questions, ask your nurse or doctor.        vitamin B-12 500 MCG tablet Commonly known as: CYANOCOBALAMIN Take 1,000 mcg by mouth daily.   B-12 1000 MCG Tbcr Take 2,000 mcg by mouth daily.   Farxiga 10 MG Tabs tablet Generic drug: dapagliflozin propanediol Take 10 mg by mouth daily before breakfast.   fluticasone 50 MCG/ACT nasal spray Commonly known as: FLONASE Place 2 sprays into both nostrils every morning.   glucose blood test strip Commonly known as: ONE TOUCH ULTRA TEST CHECK BLOOD SUGAR TWO TIMES DAILY AS DIRECTED   levocetirizine 5 MG tablet Commonly known as: XYZAL Take 5 mg by mouth at bedtime.   lisinopril 40 MG tablet Commonly known as: ZESTRIL TAKE 1 TABLET BY MOUTH EVERY DAY   metoprolol tartrate 50 MG tablet Commonly known as: LOPRESSOR TAKE 1 TABLET BY MOUTH TWICE A DAY   multivitamin with minerals Tabs tablet Take 1 tablet by mouth daily.   ONE TOUCH ULTRA 2 w/Device Kit Use to check blood sugars two times day   onetouch ultrasoft lancets Use to check blood sugar two times a  day.   pantoprazole 40 MG tablet Commonly known as: PROTONIX TAKE 1 TABLET (40 MG TOTAL) BY MOUTH 2 (TWO) TIMES DAILY BEFORE A MEAL.   Pen Needles 31G X 5 MM Misc 1 application by Does not apply route 4 (four) times daily.   Tyler Aas FlexTouch 200 UNIT/ML FlexTouch Pen Generic drug: insulin degludec Inject 34 Units into the skin daily. What changed:   how to take this  when to take this Changed by: Dorita Sciara, MD        OBJECTIVE:   Vital Signs: BP 138/88 (BP Location: Left Arm, Patient Position: Sitting, Cuff Size: Normal)   Pulse 77   Temp 98.3 F (36.8 C)   Ht '5\' 8"'$  (1.727 m)   Wt 189 lb (85.7 kg)   SpO2 98%   BMI 28.74 kg/m   Wt Readings from Last 3 Encounters:  08/19/19 189 lb (85.7 kg)  07/30/19 192 lb 8 oz (87.3 kg)  01/19/19 165 lb (74.8 kg)  Exam: General: Pt appears well and is in NAD  Neck: General: Supple without adenopathy. Thyroid: Thyroid size normal.  No goiter or nodules appreciated.  Lungs: Clear with good BS bilat with no rales, rhonchi, or wheezes  Heart: RRR with normal S1 and S2 and no gallops; no murmurs; no rub  Abdomen: Normoactive bowel sounds, soft, nontender, without masses or organomegaly palpable  Extremities: No pretibial edema.   Neuro: MS is good with appropriate affect, pt is alert and Ox3    DM foot exam:  08/19/2019  The skin of the feet is intact without sores or ulcerations. The pedal pulses are 2+ on right and 2+ on left. The sensation is absent to a screening 5.07, 10 gram monofilament bilaterally        DATA REVIEWED:  Lab Results  Component Value Date   HGBA1C 7.1 (A) 08/19/2019   HGBA1C 7.0 (H) 10/16/2018   HGBA1C 13.3 (H) 07/23/2018   Lab Results  Component Value Date   LDLCALC 82 10/16/2018   CREATININE 1.33 (H) 06/01/2019   No results found for: MICRALBCREAT   Lab Results  Component Value Date   CHOL 201 (H) 10/16/2018   HDL 104.50 10/16/2018   LDLCALC 82 10/16/2018   LDLDIRECT  160.5 02/20/2012   TRIG 70.0 10/16/2018   CHOLHDL 2 10/16/2018       Results for Desha, Scherrie L "[name]" (MRN [id number]) as of 08/20/2019 07:36  Ref. Range 08/19/2019 08:47  Sodium Latest Ref Range: 135 - 145 mEq/L 131 (L)  Potassium Latest Ref Range: 3.5 - 5.1 mEq/L 4.6  Chloride Latest Ref Range: 96 - 112 mEq/L 99  CO2 Latest Ref Range: 19 - 32 mEq/L 22  Glucose Latest Ref Range: 70 - 99 mg/dL 255 (H)  BUN Latest Ref Range: 6 - 23 mg/dL 16  Creatinine Latest Ref Range: 0.40 - 1.20 mg/dL 0.87  Calcium Latest Ref Range: 8.4 - 10.5 mg/dL 9.7  GFR Latest Ref Range: >60.00 mL/min 67.21  MICROALB/CREAT RATIO Latest Ref Range: 0.0 - 30.0 mg/g 6.6  Creatinine,U Latest Units: mg/dL 24.6  Microalb, Ur Latest Ref Range: 0.0 - 1.9 mg/dL 1.6    ASSESSMENT / PLAN / RECOMMENDATIONS:   1) Type 2 Diabetes Mellitus, Optimally controlled, With Neuropathic complications - Most recent A1c of 7.1 %. Goal A1c < 7.0 %.     -A1c acceptable range -Patient endorses hypoglycemic symptoms sometimes with BG's in the 90s, I explained to the patient that low BG is anything below 70 mg/DL, the fact that she is symptomatic with normal BG's indicates that she does tend to have higher BG's. -The patient has been taking [place] 2 hours prior to breakfast, I have advised her to start taking it with breakfast and if she continues to have tight BG's during the day she may cut down the [place] to 30 units daily -BMP today is normal    She is intolerant to Metformin, she is not a candidate for GLP-1 agonists, or DPP-4 inhibitors due to hx of pancreatitis.   MEDICATIONS: -Continue Tresiba 34 units daily -Continue Farxiga 10 mg daily with breakfast    EDUCATION / INSTRUCTIONS:  BG monitoring instructions: Patient is instructed to check her blood sugars 1 times a day.  Call [facility] if: BG persistently < 70 or > 300. . I reviewed the Rule of 15 for the treatment of hypoglycemia in detail with  the patient. Literature supplied.   2) Diabetic complications:   Eye: Does not have known  diabetic retinopathy.   Neuro/ Feet: Does have known diabetic peripheral neuropathy.  Renal: Patient does not have known baseline CKD. She is on an ACEI/ARB at present. Normal MA/Cr ratio    F/U in 4 months   Signed electronically by: Mack Guise, MD  [facility]  [facility] [address] [place], [place] [address] Phone: [phone] FAX: [phone]   CC: [name], MD [place] [address] Phone: [phone]  Fax: [phone]  Return to Endocrinology clinic as below: Future Appointments  Date Time Provider [place]  08/24/2019 11:10 AM Gatha Mayer, MD [facility]  10/08/2019  8:00 AM Owens Loffler, MD [facility]  12/24/2019  7:50 AM Benford Asch, Melanie Crazier, MD [facility] None

## 2019-08-19 ENCOUNTER — Encounter: Payer: Self-pay | Admitting: Internal Medicine

## 2019-08-19 ENCOUNTER — Ambulatory Visit: Payer: 59 | Admitting: Internal Medicine

## 2019-08-19 ENCOUNTER — Other Ambulatory Visit: Payer: Self-pay

## 2019-08-19 VITALS — BP 138/88 | HR 77 | Temp 98.3°F | Ht 68.0 in | Wt 189.0 lb

## 2019-08-19 DIAGNOSIS — E1165 Type 2 diabetes mellitus with hyperglycemia: Secondary | ICD-10-CM

## 2019-08-19 DIAGNOSIS — Z794 Long term (current) use of insulin: Secondary | ICD-10-CM

## 2019-08-19 LAB — GLUCOSE, POCT (MANUAL RESULT ENTRY): POC Glucose: 248 mg/dl — AB (ref 70–99)

## 2019-08-19 LAB — BASIC METABOLIC PANEL
BUN: 16 mg/dL (ref 6–23)
CO2: 22 mEq/L (ref 19–32)
Calcium: 9.7 mg/dL (ref 8.4–10.5)
Chloride: 99 mEq/L (ref 96–112)
Creatinine, Ser: 0.87 mg/dL (ref 0.40–1.20)
GFR: 67.21 mL/min (ref >60.00)
Glucose, Bld: 255 mg/dL — ABNORMAL HIGH (ref 70–99)
Potassium: 4.6 mEq/L (ref 3.5–5.1)
Sodium: 131 mEq/L — ABNORMAL LOW (ref 135–145)

## 2019-08-19 LAB — POCT GLYCOSYLATED HEMOGLOBIN (HGB A1C): Hemoglobin A1C: 7.1 % — AB (ref 4.0–5.6)

## 2019-08-19 LAB — MICROALBUMIN / CREATININE URINE RATIO
Creatinine,U: 24.6 mg/dL
Microalb Creat Ratio: 6.6 mg/g (ref 0.0–30.0)
Microalb, Ur: 1.6 mg/dL (ref 0.0–1.9)

## 2019-08-19 MED ORDER — TRESIBA FLEXTOUCH 200 UNIT/ML ~~LOC~~ SOPN: 34.0000 [IU] | PEN_INJECTOR | Freq: Every day | SUBCUTANEOUS | 11 refills | Status: DC

## 2019-08-19 NOTE — Patient Instructions (Addendum)
-   Take Marcelline Deist with Breakfast, Continue Tresiba at 34 units daily but if you notice that your sugars continue to drop despite taking the [place] with breakfast, please cut the tresiba down to 30 units daily

## 2019-08-20 ENCOUNTER — Telehealth: Payer: Self-pay | Admitting: Internal Medicine

## 2019-08-20 MED ORDER — FARXIGA 10 MG PO TABS: 10.0000 mg | ORAL_TABLET | Freq: Every day | ORAL | 3 refills | Status: DC

## 2019-08-20 NOTE — Telephone Encounter (Signed)
Please let her know that her kidney function and urine tests are normal and that I refilled her Farxiga     Thanks    Abby Raelyn Mora, MD  [facility]  [facility] [address] [place], [place] [address] Phone: [phone] FAX: [phone]

## 2019-08-20 NOTE — Telephone Encounter (Signed)
Lft vm informing of normal results and refill sent.

## 2019-08-24 ENCOUNTER — Encounter: Payer: Self-pay | Admitting: Internal Medicine

## 2019-08-24 ENCOUNTER — Ambulatory Visit: Payer: 59 | Admitting: Internal Medicine

## 2019-08-24 VITALS — BP 140/100 | HR 80 | Temp 98.0°F | Ht 68.0 in | Wt 187.0 lb

## 2019-08-24 DIAGNOSIS — K860 Alcohol-induced chronic pancreatitis: Secondary | ICD-10-CM

## 2019-08-24 DIAGNOSIS — K2980 Duodenitis without bleeding: Secondary | ICD-10-CM

## 2019-08-24 NOTE — Assessment & Plan Note (Signed)
This has not recurred.  She had had unusual duodenal edema of unclear etiology and I had wondered if it could have been lisinopril i.e. ACE inhibitor induced angioedema.  That does not appear to be the case.  We will continue to monitor for any recurrence.

## 2019-08-24 NOTE — Patient Instructions (Signed)
If you are age 57 or older, your body mass index should be between 23-30. Your Body mass index is 28.43 kg/m. If this is out of the aforementioned range listed, please consider follow up with your Primary Care Provider.  If you are age 76 or younger, your body mass index should be between 19-25. Your Body mass index is 28.43 kg/m. If this is out of the aformentioned range listed, please consider follow up with your Primary Care Provider.    As per our discussion no alcohol is best for you.   We will see you back in the office in 6 months.   Thank you for choosing me and [facility].  Dr. Leone Payor

## 2019-08-24 NOTE — Assessment & Plan Note (Signed)
This is improved.  She is reminded that complete alcohol abstinence is the best plan here. Return in 6 months.  Office visit recall placed.  Sooner as needed.

## 2019-08-24 NOTE — Progress Notes (Signed)
Mallory Stewart 59 y.o. 1961/01/20 [id number]  Assessment & Plan:  Chronic alcoholic pancreatitis ([place]) This is improved.  She is reminded that complete alcohol abstinence is the best plan here. Return in 6 months.  Office visit recall placed.  Sooner as needed.    Duodenitis - edema ? andgioedema This has not recurred.  She had had unusual duodenal edema of unclear etiology and I had wondered if it could have been lisinopril i.e. ACE inhibitor induced angioedema.  That does not appear to be the case.  We will continue to monitor for any recurrence.  CC: [name], MD     Subjective:   Chief Complaint: Follow-up of pancreatitis  HPI Mallory Stewart is here for follow-up of her alcoholic pancreatitis reports that she is feeling well.  She had a CT scan in February as part of a follow-up.  At 1 point she had a cystic lesion with negative EUS evaluation and the plan was to follow-up with imaging cross-sectional.  In February she had trace amount of fluid and subtle inflammatory changes adjacent to the pancreas.  No fluid collections no cystic lesions.  She also has hepatic steatosis and splenomegaly.  She has been struggling with her blood sugars though these are getting better.  She is being followed in endocrine.  When we were covering alcohol abstinence she said that she had had a glass of wine "just 1" the other day and does this from "time to time".  No signs of steatorrhea  Wt Readings from Last 3 Encounters:  08/24/19 187 lb (84.8 kg)  08/19/19 189 lb (85.7 kg)  07/30/19 192 lb 8 oz (87.3 kg)   She maintains a gluten-free diet because her stomach feels better that way.  Reports initially when she was having some "nerve pain" she had met someone on a plane who is a doctor that suggested she try stopping gluten.  It did not help neuropathy but it did provide a more tolerable diet and as far as GI symptoms.  Now if she ingests gluten she will get stomachaches and cramps.  But again  she reports that she is doing well at this time. Allergies  Allergen Reactions  . Gluten Meal Other (See Comments)    Stomach distress  . Zocor [Simvastatin] Other (See Comments)    Elevated lfts?   Current Meds  Medication Sig  . Blood Glucose Monitoring Suppl (ONE TOUCH ULTRA 2) w/Device KIT Use to check blood sugars two times day  . Cyanocobalamin (B-12) 1000 MCG TBCR Take 2,000 mcg by mouth daily.  . dapagliflozin propanediol (FARXIGA) 10 MG TABS tablet Take 10 mg by mouth daily before breakfast.  . fluticasone (FLONASE) 50 MCG/ACT nasal spray Place 2 sprays into both nostrils every morning.  Marland Kitchen glucose blood (ONE TOUCH ULTRA TEST) test strip CHECK BLOOD SUGAR TWO TIMES DAILY AS DIRECTED  . insulin degludec (TRESIBA FLEXTOUCH) 200 UNIT/ML FlexTouch Pen Inject 34 Units into the skin daily.  . Insulin Pen Needle (PEN NEEDLES) 31G X 5 MM MISC 1 application by Does not apply route 4 (four) times daily.  . Lancets (ONETOUCH ULTRASOFT) lancets Use to check blood sugar two times a day.  . levocetirizine (XYZAL) 5 MG tablet Take 5 mg by mouth at bedtime.  Marland Kitchen lisinopril (ZESTRIL) 40 MG tablet TAKE 1 TABLET BY MOUTH EVERY DAY  . metoprolol tartrate (LOPRESSOR) 50 MG tablet TAKE 1 TABLET BY MOUTH TWICE A DAY  . Multiple Vitamin (MULTIVITAMIN WITH MINERALS) TABS tablet Take  1 tablet by mouth daily.  . pantoprazole (PROTONIX) 40 MG tablet TAKE 1 TABLET (40 MG TOTAL) BY MOUTH 2 (TWO) TIMES DAILY BEFORE A MEAL.  . vitamin B-12 (CYANOCOBALAMIN) 500 MCG tablet Take 1,000 mcg by mouth daily.   Past Medical History:  Diagnosis Date  . Alcohol-induced chronic pancreatitis ([place])   . B12 deficiency   . Concussion   . DDD (degenerative disc disease), cervical   . Diabetes ([place])   . DKA, type 2 ([place])   . Gastric outlet obstruction 08/12/2018  . Hypertension   . Neuropathy   . Seasonal allergies    Past Surgical History:  Procedure Laterality Date  . BIOPSY  01/19/2019   Procedure: BIOPSY;  Surgeon:  Rush Landmark Telford Nab., MD;  Location: [place];  Service: Gastroenterology;;  . CHOLECYSTECTOMY N/A 07/28/2016   Procedure: LAPAROSCOPIC CHOLECYSTECTOMY WITH INTRAOPERATIVE CHOLANGIOGRAM;  Surgeon: Mickeal Skinner, MD;  Location: [place];  Service: General;  Laterality: N/A;  . ESOPHAGOGASTRODUODENOSCOPY (EGD) WITH PROPOFOL N/A 08/13/2018   Procedure: ESOPHAGOGASTRODUODENOSCOPY (EGD) WITH PROPOFOL;  Surgeon: Irving Copas., MD;  Location: [place];  Service: Gastroenterology;  Laterality: N/A;  . ESOPHAGOGASTRODUODENOSCOPY (EGD) WITH PROPOFOL N/A 01/19/2019   Procedure: ESOPHAGOGASTRODUODENOSCOPY (EGD) WITH PROPOFOL;  Surgeon: Rush Landmark Telford Nab., MD;  Location: [place];  Service: Gastroenterology;  Laterality: N/A;  . LEEP  1990's  . ORIF FOOT FRACTURE  09/2008   L 5th metatarsal  . REFRACTIVE SURGERY  2000  . TONSILLECTOMY    . UPPER ESOPHAGEAL ENDOSCOPIC ULTRASOUND (EUS) N/A 08/13/2018   Procedure: UPPER ESOPHAGEAL ENDOSCOPIC ULTRASOUND (EUS);  Surgeon: Irving Copas., MD;  Location: [place];  Service: Gastroenterology;  Laterality: N/A;  . 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 not traveling   regular exercise , walking 4-5 times a week   Diet fruits and veggies, water   Alcohol at least several glasses of wine a week Stopped 08/2018   Never smoker, no drug use   family history includes Atrial fibrillation in her sister; Brain cancer in her maternal grandfather; Breast cancer in her maternal grandmother; Cancer in her paternal grandfather; Coronary artery disease in her paternal  grandmother; Healthy in her brother and brother; Heart attack (age of onset: 20) in her paternal grandmother; Other in her father and mother; Prostate cancer in her father; Stroke in her father.   Review of Systems As per HPI  Objective:   Physical Exam BP (!) 140/100 (BP Location: Left Arm, Patient Position: Sitting, Cuff Size: Normal)   Pulse 80   Temp 98 F (36.7 C)   Ht '5\' 8"'$  (1.727 m) Comment: height measured without shoes  Wt 187 lb (84.8 kg)   BMI 28.43 kg/m  No acute distress Abdomen is soft nontender no organomegaly or mass   21 minutes total time

## 2019-09-17 ENCOUNTER — Other Ambulatory Visit: Payer: Self-pay | Admitting: Family Medicine

## 2019-10-08 ENCOUNTER — Other Ambulatory Visit: Payer: Self-pay | Admitting: Internal Medicine

## 2019-10-08 ENCOUNTER — Ambulatory Visit: Payer: 59 | Admitting: Family Medicine

## 2019-10-15 ENCOUNTER — Other Ambulatory Visit: Payer: Self-pay | Admitting: Family Medicine

## 2019-11-03 ENCOUNTER — Ambulatory Visit (INDEPENDENT_AMBULATORY_CARE_PROVIDER_SITE_OTHER): Payer: 59 | Admitting: Family Medicine

## 2019-11-03 ENCOUNTER — Encounter: Payer: Self-pay | Admitting: Family Medicine

## 2019-11-03 ENCOUNTER — Other Ambulatory Visit: Payer: Self-pay

## 2019-11-03 VITALS — BP 118/82 | HR 78 | Temp 98.4°F | Ht 68.25 in | Wt 189.2 lb

## 2019-11-03 DIAGNOSIS — L720 Epidermal cyst: Secondary | ICD-10-CM | POA: Diagnosis not present

## 2019-11-03 DIAGNOSIS — S46819A Strain of other muscles, fascia and tendons at shoulder and upper arm level, unspecified arm, initial encounter: Secondary | ICD-10-CM

## 2019-11-03 MED ORDER — METHOCARBAMOL 500 MG PO TABS: 500.0000 mg | ORAL_TABLET | Freq: Three times a day (TID) | ORAL | 0 refills | Status: DC | PRN

## 2019-11-03 NOTE — Assessment & Plan Note (Signed)
No current infection.. increasing in size, irritating to patient.  Recommended holding off on removal today given current neck pain.  Refer to  Derm for best chance of successful removal.

## 2019-11-03 NOTE — Assessment & Plan Note (Signed)
Whiplash injury. Ttreat with muscle relaxants, heat, massage and home PT. INfo given.  SE to NSAIDs

## 2019-11-03 NOTE — Patient Instructions (Addendum)
Heat and continue home PT. Consider massage.  Start muscle relaxant. Dermatology will call to set up removal of cyst.

## 2019-11-03 NOTE — Progress Notes (Signed)
Chief Complaint  Patient presents with  . Cyst Removal on Neck    History of Present Illness: HPI   59 year old female presents with  Cyst on neck.. increasing in size over the last year. Has been present for several years but much smaller then pea size.  The area is not red, inflamed. No discharge, no pain.   She has had several days of neck spasm and mid back.. through shoulder blades.  Decreased ROM of head.   Started after dog jumped on her and whiplashed 3 days ago.  She has been  Using heat, ice and massage/streching   This visit occurred during the SARS-CoV-2 public health emergency.  Safety protocols were in place, including screening questions prior to the visit, additional usage of staff PPE, and extensive cleaning of exam room while observing appropriate contact time as indicated for disinfecting solutions.   COVID 19 screen:  No recent travel or known exposure to COVID19 The patient denies respiratory symptoms of COVID 19 at this time. The importance of social distancing was discussed today.     ROS    Past Medical History:  Diagnosis Date  . Alcohol-induced chronic pancreatitis ([place])   . B12 deficiency   . Concussion   . DDD (degenerative disc disease), cervical   . Diabetes ([place])   . DKA, type 2 ([place])   . Gastric outlet obstruction 08/12/2018  . Hypertension   . Neuropathy   . Seasonal allergies     reports that she has never smoked. She has never used smokeless tobacco. She reports previous alcohol use of about 7.0 standard drinks of alcohol per week. She reports that she does not use drugs.   Current Outpatient Medications:  .  Blood Glucose Monitoring Suppl (ONE TOUCH ULTRA 2) w/Device KIT, Use to check blood sugars two times day, Disp: 1 each, Rfl: 0 .  Cyanocobalamin (B-12) 1000 MCG TBCR, Take 2,000 mcg by mouth daily., Disp: 30 tablet, Rfl:  .  dapagliflozin propanediol (FARXIGA) 10 MG TABS tablet, Take 10 mg by mouth daily before breakfast., Disp:  90 tablet, Rfl: 3 .  fluticasone (FLONASE) 50 MCG/ACT nasal spray, Place 2 sprays into both nostrils every morning., Disp: , Rfl:  .  glucose blood (ONE TOUCH ULTRA TEST) test strip, CHECK BLOOD SUGAR TWO TIMES DAILY AS DIRECTED, Disp: 200 each, Rfl: 3 .  insulin degludec (TRESIBA FLEXTOUCH) 200 UNIT/ML FlexTouch Pen, Inject 34 Units into the skin daily., Disp: 15 pen, Rfl: 11 .  Insulin Pen Needle (PEN NEEDLES) 31G X 5 MM MISC, 1 application by Does not apply route 4 (four) times daily., Disp: 150 each, Rfl: 11 .  Lancets (ONETOUCH ULTRASOFT) lancets, Use to check blood sugar two times a day., Disp: 200 each, Rfl: 3 .  levocetirizine (XYZAL) 5 MG tablet, Take 5 mg by mouth at bedtime., Disp: , Rfl:  .  lisinopril (ZESTRIL) 40 MG tablet, TAKE 1 TABLET BY MOUTH EVERY DAY, Disp: 90 tablet, Rfl: 1 .  metoprolol tartrate (LOPRESSOR) 50 MG tablet, TAKE 1 TABLET BY MOUTH TWICE A DAY, Disp: 180 tablet, Rfl: 0 .  Multiple Vitamin (MULTIVITAMIN WITH MINERALS) TABS tablet, Take 1 tablet by mouth daily., Disp: , Rfl:  .  pantoprazole (PROTONIX) 40 MG tablet, TAKE 1 TABLET (40 MG TOTAL) BY MOUTH 2 (TWO) TIMES DAILY BEFORE A MEAL., Disp: 60 tablet, Rfl: 1 .  vitamin B-12 (CYANOCOBALAMIN) 500 MCG tablet, Take 1,000 mcg by mouth daily., Disp: , Rfl:    Observations/Objective:  Blood pressure 118/82, pulse 78, temperature 98.4 F (36.9 C), temperature source Temporal, height 5' 8.25" (1.734 m), weight 189 lb 4 oz (85.8 kg), SpO2 98 %.  Physical Exam Constitutional:      General: She is not in acute distress.    Appearance: Normal appearance. She is well-developed. She is not ill-appearing or toxic-appearing.  HENT:     Head: Normocephalic.     Right Ear: Hearing, tympanic membrane, ear canal and external ear normal. Tympanic membrane is not erythematous, retracted or bulging.     Left Ear: Hearing, tympanic membrane, ear canal and external ear normal. Tympanic membrane is not erythematous, retracted or  bulging.     Nose: No mucosal edema or rhinorrhea.     Right Sinus: No maxillary sinus tenderness or frontal sinus tenderness.     Left Sinus: No maxillary sinus tenderness or frontal sinus tenderness.     Mouth/Throat:     Pharynx: Uvula midline.  Eyes:     General: Lids are normal. Lids are everted, no foreign bodies appreciated.     Conjunctiva/sclera: Conjunctivae normal.     Pupils: Pupils are equal, round, and reactive to light.  Neck:     Thyroid: No thyroid mass or thyromegaly.     Vascular: No carotid bruit.     Trachea: Trachea normal.  Cardiovascular:     Rate and Rhythm: Normal rate and regular rhythm.     Pulses: Normal pulses.     Heart sounds: Normal heart sounds, S1 normal and S2 normal. No murmur heard.  No friction rub. No gallop.   Pulmonary:     Effort: Pulmonary effort is normal. No tachypnea or respiratory distress.     Breath sounds: Normal breath sounds. No decreased breath sounds, wheezing, rhonchi or rales.  Abdominal:     General: Bowel sounds are normal.     Palpations: Abdomen is soft.     Tenderness: There is no abdominal tenderness.  Musculoskeletal:     Cervical back: Neck supple. Tenderness present. No bony tenderness. Pain with movement present. Decreased range of motion.     Thoracic back: Spasms and tenderness present. No bony tenderness. Decreased range of motion.  Skin:    General: Skin is warm and dry.     Findings: No rash.  Neurological:     Mental Status: She is alert.  Psychiatric:        Mood and Affect: Mood is not anxious or depressed.        Speech: Speech normal.        Behavior: Behavior normal. Behavior is cooperative.        Thought Content: Thought content normal.        Judgment: Judgment normal.      Assessment and Plan Trapezius strain Whiplash injury. Ttreat with muscle relaxants, heat, massage and home PT. INfo given.  SE to NSAIDs  Epidermal cyst of neck No current infection.. increasing in size, irritating to  patient.  Recommended holding off on removal today given current neck pain.  Refer to  Derm for best chance of successful removal.       Eliezer Lofts, MD

## 2019-11-06 ENCOUNTER — Telehealth: Payer: Self-pay

## 2019-11-06 NOTE — Telephone Encounter (Signed)
Patient contacted the office and states that she was seen on 6/29 for whiplash. She states Dr. Ermalene Searing prescribed Robaxin, and she states this is not helping at all - and she has not had any relief. She states she is having a lot of trouble sleeping and resting, and even trying to function throughout the day, due to the pain and spasms. She is wondering if there is anything else that can be sent in for her? [facility] [address]

## 2019-11-06 NOTE — Telephone Encounter (Signed)
Referral to physical therapy.. If still not better after that then re-eval in office.  Let me know location

## 2019-11-06 NOTE — Telephone Encounter (Signed)
Spoke with Mallory Stewart. She does not wish to do a prednisone taper.  She has not tolerated this medication very well in the past.  She states she did get scheduled for a massage next week but the Robaxin is not helping at all.  She is asking what her other options would be.  Please advise.

## 2019-11-06 NOTE — Telephone Encounter (Signed)
Is she willing to try a prednisone taper? Pharmacy?

## 2019-11-10 NOTE — Telephone Encounter (Signed)
Left message for Mallory Stewart letting her know Dr. Ermalene Searing wants to refer her to PT.   I ask that she call back to let us know if she is agreeable with the referral and if she would prefer [place] or [place].

## 2019-11-11 NOTE — Telephone Encounter (Signed)
Left message for [name] to return my call.

## 2019-11-12 NOTE — Telephone Encounter (Signed)
Mallory Stewart returned phone call. She states she is doing water therapy now, and is hoping that this will help her. She states she will call back at a later date if she wants to move forward with a PT referral.

## 2019-11-30 ENCOUNTER — Telehealth: Payer: Self-pay | Admitting: Family Medicine

## 2019-11-30 ENCOUNTER — Other Ambulatory Visit: Payer: Self-pay | Admitting: Internal Medicine

## 2019-11-30 NOTE — Telephone Encounter (Signed)
Please schedule CPE with fasting labs prior with Dr. Bedsole.  

## 2019-12-02 NOTE — Telephone Encounter (Signed)
Labs 8/31 Cpx 9/2 Pt aware

## 2019-12-18 ENCOUNTER — Telehealth: Payer: Self-pay | Admitting: Internal Medicine

## 2019-12-18 MED ORDER — PANTOPRAZOLE SODIUM 40 MG PO TBEC: 40.0000 mg | DELAYED_RELEASE_TABLET | Freq: Two times a day (BID) | ORAL | 1 refills | Status: DC

## 2019-12-18 NOTE — Telephone Encounter (Signed)
Patient requesting refill on Pantoprazole 

## 2019-12-18 NOTE — Telephone Encounter (Signed)
Pantoprazole sent in as requested.

## 2019-12-24 ENCOUNTER — Ambulatory Visit: Payer: 59 | Admitting: Internal Medicine

## 2020-01-05 ENCOUNTER — Telehealth: Payer: Self-pay | Admitting: Family Medicine

## 2020-01-05 ENCOUNTER — Other Ambulatory Visit: Payer: 59

## 2020-01-05 DIAGNOSIS — E1142 Type 2 diabetes mellitus with diabetic polyneuropathy: Secondary | ICD-10-CM

## 2020-01-05 DIAGNOSIS — Z794 Long term (current) use of insulin: Secondary | ICD-10-CM

## 2020-01-05 NOTE — Telephone Encounter (Signed)
-----   Message from Alvina Chou sent at 12/21/2019 10:32 AM EDT ----- Regarding: Lab orders for Tuesday, 8.31.21 Patient is scheduled for CPX labs, please order future labs, Thanks , Camelia Eng

## 2020-01-07 ENCOUNTER — Encounter: Payer: 59 | Admitting: Family Medicine

## 2020-02-12 ENCOUNTER — Other Ambulatory Visit: Payer: 59

## 2020-02-15 ENCOUNTER — Ambulatory Visit: Payer: 59 | Admitting: Internal Medicine

## 2020-02-18 ENCOUNTER — Encounter: Payer: 59 | Admitting: Family Medicine

## 2020-02-18 ENCOUNTER — Telehealth: Payer: Self-pay

## 2020-02-18 NOTE — Telephone Encounter (Signed)
[facility] Night - Client Nonclinical Telephone Record AccessNurse Client [facility] Night - Client Client Site [facility] [place] - Night Physician Kerby Nora - MD Contact Type Call Who Is Calling Patient / Member / Family / Caregiver Caller Name Trisha Ken Caller Phone Number [phone] Patient Name Mallory Stewart Patient DOB 12-Dec-1960 Call Type Message Only Information Provided Reason for Call Request to Reschedule Office Appointment Initial Comment The caller needs to reschedule. Disp. Time Disposition Final User 02/17/2020 5:17:42 PM General Information Provided Yes Majel Homer Call Closed By: Majel Homer Transaction Date/Time: 02/17/2020 5:15:42 PM (ET)

## 2020-02-18 NOTE — Telephone Encounter (Signed)
Left message asking pt to call office  °

## 2020-02-22 ENCOUNTER — Other Ambulatory Visit: Payer: Self-pay | Admitting: Family Medicine

## 2020-03-08 NOTE — Telephone Encounter (Signed)
Left message asking pt to call office  °

## 2020-03-08 NOTE — Telephone Encounter (Signed)
Do not see rescheduled appt.

## 2020-03-21 ENCOUNTER — Ambulatory Visit: Payer: Self-pay | Admitting: Internal Medicine

## 2020-03-21 NOTE — Progress Notes (Deleted)
Name: Mallory Stewart  Age/ Sex: 59 y.o., female   MRN/ DOB: [id number], 04-16-1961     PCP: [name], MD   Reason for Endocrinology Evaluation: Type 2 Diabetes Mellitus  Initial Endocrine Consultative Visit: 08/22/2018    PATIENT IDENTIFIER: Mallory Stewart is a 59 y.o. female with a past medical history of HTN, T2DM , chronic pancreatitis and gastric outlet obstruction. The patient has followed with Endocrinology clinic since 08/22/2018 for consultative assistance with management of her diabetes.  DIABETIC HISTORY:  [name] was with  T2DM in 2010.Pt had hospital admission in 2014 for pancreatitis secondary to ETOH intake, with another one in 2018 and 2020. Insulin started in 07/2018 . Her hemoglobin A1c has ranged from 5.4%in 2016, peaking at13.3%In 2020.  Prandial insulin started 08/2018 Farxiga added in 09/2018  SUBJECTIVE:   During the last visit (08/19/2019): A1c 7.1 %, we continued tresiba and added [place]  Today (03/21/2020): Mallory Stewart is here for a follow up on diabetes management. She checks her blood sugars 1 times daily. The patient has  had hypoglycemic episodes since the last clinic visit, which typically occur 1x /week - most often occuring during the day ( mid-late afternoon ). The patient is symptomatic with these episodes, with symptoms of sweating and shaking    HOME DIABETES REGIMEN:  Tresiba 34 units daily  Farxiga 10 mg daily   Statin: No ACE-I/ARB: yes    METER DOWNLOAD SUMMARY: Did not bring     DIABETIC COMPLICATIONS: Microvascular complications:   Neuropathic   Denies: CKD  Last Eye Exam: Completed 2018  Macrovascular complications:   Denies: CAD, CVA, PVD   HISTORY:  Past Medical History:  Past Medical History:  Diagnosis Date  . Alcohol-induced chronic pancreatitis ([place])   . B12 deficiency   . Concussion   . DDD (degenerative  disc disease), cervical   . Diabetes ([name])   . DKA, type 2 ([place])   . Gastric outlet obstruction 08/12/2018  . Hypertension   . Neuropathy   . Seasonal allergies    Past Surgical History:  Past Surgical History:  Procedure Laterality Date  . BIOPSY  01/19/2019   Procedure: BIOPSY;  Surgeon: Rush Landmark Telford Nab., MD;  Location: [place];  Service: Gastroenterology;;  . CHOLECYSTECTOMY N/A 07/28/2016   Procedure: LAPAROSCOPIC CHOLECYSTECTOMY WITH INTRAOPERATIVE CHOLANGIOGRAM;  Surgeon: Mickeal Skinner, MD;  Location: [place];  Service: General;  Laterality: N/A;  . ESOPHAGOGASTRODUODENOSCOPY (EGD) WITH PROPOFOL N/A 08/13/2018   Procedure: ESOPHAGOGASTRODUODENOSCOPY (EGD) WITH PROPOFOL;  Surgeon: Irving Copas., MD;  Location: [place];  Service: Gastroenterology;  Laterality: N/A;  . ESOPHAGOGASTRODUODENOSCOPY (EGD) WITH PROPOFOL N/A 01/19/2019   Procedure: ESOPHAGOGASTRODUODENOSCOPY (EGD) WITH PROPOFOL;  Surgeon: Rush Landmark Telford Nab., MD;  Location: [place];  Service: Gastroenterology;  Laterality: N/A;  . LEEP  1990's  . ORIF FOOT FRACTURE  09/2008   L 5th metatarsal  . REFRACTIVE SURGERY  2000  . TONSILLECTOMY    . UPPER ESOPHAGEAL ENDOSCOPIC ULTRASOUND (EUS) N/A 08/13/2018   Procedure: UPPER ESOPHAGEAL ENDOSCOPIC ULTRASOUND (EUS);  Surgeon: Irving Copas., MD;  Location: [place];  Service: Gastroenterology;  Laterality: N/A;  . UPPER ESOPHAGEAL ENDOSCOPIC ULTRASOUND (EUS) N/A 01/19/2019   Procedure: UPPER ESOPHAGEAL ENDOSCOPIC ULTRASOUND (EUS);  Surgeon: Irving Copas., MD;  Location: [place];  Service: Gastroenterology;  Laterality: N/A;    Social History:  reports that she has never smoked. She has never used smokeless tobacco. She reports previous alcohol use of about  7.0 standard drinks of alcohol per week. She reports that she does not use drugs. Family History:  Family History  Problem Relation Age of Onset  . Other Mother         tachycardia.Marland KitchenMarland Kitchen?afib  . Stroke Father        after hernia suegery  . Prostate cancer Father   . Other Father        global transient amnesia, unclear source  . Atrial fibrillation Sister   . Healthy Brother   . Healthy Brother   . Coronary artery disease Paternal Grandmother   . Heart attack Paternal Grandmother 50  . Brain cancer Maternal Grandfather        ?  Marland Kitchen Cancer Paternal Grandfather        ?  Marland Kitchen Breast cancer Maternal Grandmother      HOME MEDICATIONS: Allergies as of 03/21/2020      Reactions   Gluten Meal Other (See Comments)   Stomach distress   Zocor [simvastatin] Other (See Comments)   Elevated lfts?      Medication List       Accurate as of March 21, 2020  7:24 AM. If you have any questions, ask your nurse or doctor.        vitamin B-12 500 MCG tablet Commonly known as: CYANOCOBALAMIN Take 1,000 mcg by mouth daily.   B-12 1000 MCG Tbcr Take 2,000 mcg by mouth daily.   B-D UF III MINI PEN NEEDLES 31G X 5 MM Misc Generic drug: Insulin Pen Needle USE 4 TIMES A DAY AS DIRECTED   Farxiga 10 MG Tabs tablet Generic drug: dapagliflozin propanediol Take 10 mg by mouth daily before breakfast.   fluticasone 50 MCG/ACT nasal spray Commonly known as: FLONASE Place 2 sprays into both nostrils every morning.   glucose blood test strip Commonly known as: ONE TOUCH ULTRA TEST CHECK BLOOD SUGAR TWO TIMES DAILY AS DIRECTED   levocetirizine 5 MG tablet Commonly known as: XYZAL Take 5 mg by mouth at bedtime.   lisinopril 40 MG tablet Commonly known as: ZESTRIL TAKE 1 TABLET BY MOUTH EVERY DAY   methocarbamol 500 MG tablet Commonly known as: ROBAXIN Take 1 tablet (500 mg total) by mouth every 8 (eight) hours as needed for muscle spasms.   metoprolol tartrate 50 MG tablet Commonly known as: LOPRESSOR TAKE 1 TABLET BY MOUTH TWICE A DAY   multivitamin with minerals Tabs tablet Take 1 tablet by mouth daily.   ONE TOUCH ULTRA 2 w/Device Kit Use to  check blood sugars two times day   onetouch ultrasoft lancets Use to check blood sugar two times a day.   pantoprazole 40 MG tablet Commonly known as: PROTONIX Take 1 tablet (40 mg total) by mouth 2 (two) times daily before a meal.   Tyler Aas FlexTouch 200 UNIT/ML FlexTouch Pen Generic drug: insulin degludec Inject 34 Units into the skin daily.        OBJECTIVE:   Vital Signs: There were no vitals taken for this visit.  Wt Readings from Last 3 Encounters:  11/03/19 189 lb 4 oz (85.8 kg)  08/24/19 187 lb (84.8 kg)  08/19/19 189 lb (85.7 kg)     Exam: General: Pt appears well and is in NAD  Neck: General: Supple without adenopathy. Thyroid: Thyroid size normal.  No goiter or nodules appreciated.  Lungs: Clear with good BS bilat with no rales, rhonchi, or wheezes  Heart: RRR with normal S1 and S2 and no gallops; no murmurs; no rub  Abdomen: Normoactive bowel sounds, soft, nontender, without masses or organomegaly palpable  Extremities: No pretibial edema.   Neuro: MS is good with appropriate affect, pt is alert and Ox3    DM foot exam:  08/19/2019  The skin of the feet is intact without sores or ulcerations. The pedal pulses are 2+ on right and 2+ on left. The sensation is absent to a screening 5.07, 10 gram monofilament bilaterally        DATA REVIEWED:  Lab Results  Component Value Date   HGBA1C 7.1 (A) 08/19/2019   HGBA1C 7.0 (H) 10/16/2018   HGBA1C 13.3 (H) 07/23/2018   Lab Results  Component Value Date   MICROALBUR 1.6 08/19/2019   LDLCALC 82 10/16/2018   CREATININE 0.87 08/19/2019   Lab Results  Component Value Date   MICRALBCREAT 6.6 08/19/2019     Lab Results  Component Value Date   CHOL 201 (H) 10/16/2018   HDL 104.50 10/16/2018   LDLCALC 82 10/16/2018   LDLDIRECT 160.5 02/20/2012   TRIG 70.0 10/16/2018   CHOLHDL 2 10/16/2018       Results for Niebuhr, Janita L "[name]" (MRN [id number]) as of 08/20/2019 07:36  Ref. Range 08/19/2019 08:47   Sodium Latest Ref Range: 135 - 145 mEq/L 131 (L)  Potassium Latest Ref Range: 3.5 - 5.1 mEq/L 4.6  Chloride Latest Ref Range: 96 - 112 mEq/L 99  CO2 Latest Ref Range: 19 - 32 mEq/L 22  Glucose Latest Ref Range: 70 - 99 mg/dL 255 (H)  BUN Latest Ref Range: 6 - 23 mg/dL 16  Creatinine Latest Ref Range: 0.40 - 1.20 mg/dL 0.87  Calcium Latest Ref Range: 8.4 - 10.5 mg/dL 9.7  GFR Latest Ref Range: >60.00 mL/min 67.21  MICROALB/CREAT RATIO Latest Ref Range: 0.0 - 30.0 mg/g 6.6  Creatinine,U Latest Units: mg/dL 24.6  Microalb, Ur Latest Ref Range: 0.0 - 1.9 mg/dL 1.6    ASSESSMENT / PLAN / RECOMMENDATIONS:   1) Type 2 Diabetes Mellitus, Optimally controlled, With Neuropathic complications - Most recent A1c of 7.1 %. Goal A1c < 7.0 %.     -A1c acceptable range -Patient endorses hypoglycemic symptoms sometimes with BG's in the 90s, I explained to the patient that low BG is anything below 70 mg/DL, the fact that she is symptomatic with normal BG's indicates that she does tend to have higher BG's. -The patient has been taking [place] 2 hours prior to breakfast, I have advised her to start taking it with breakfast and if she continues to have tight BG's during the day she may cut down the [place] to 30 units daily -BMP today is normal    She is intolerant to Metformin, she is not a candidate for GLP-1 agonists, or DPP-4 inhibitors due to hx of pancreatitis.   MEDICATIONS: -Continue Tresiba 34 units daily -Continue Farxiga 10 mg daily with breakfast    EDUCATION / INSTRUCTIONS:  BG monitoring instructions: Patient is instructed to check her blood sugars 1 times a day.  Call [place] [facility] clinic if: BG persistently < 70  . I reviewed the Rule of 15 for the treatment of hypoglycemia in detail with the patient. Literature supplied.   2) Diabetic complications:   Eye: Does not have known diabetic retinopathy.   Neuro/ Feet: Does have known diabetic peripheral  neuropathy.  Renal: Patient does not have known baseline CKD. She is on an ACEI/ARB at present. Normal MA/Cr ratio    F/U in 4 months   Signed electronically by: Maretta Bees  Nena Jordan, MD  [facility] Endocr[facility]  [facility] [address] [place], [place] [address] Phone: [phone] FAX: [phone]   CC: [name], MD [place] [address] Phone: [phone]  Fax: [phone]  Return to Endocrinology clinic as below: Future Appointments  Date Time Provider [place]  03/21/2020  8:50 AM Kalika Smay, Melanie Crazier, MD [facility] None

## 2020-03-23 ENCOUNTER — Other Ambulatory Visit: Payer: Self-pay | Admitting: Family Medicine

## 2020-03-23 DIAGNOSIS — E119 Type 2 diabetes mellitus without complications: Secondary | ICD-10-CM

## 2020-03-24 ENCOUNTER — Other Ambulatory Visit: Payer: Self-pay | Admitting: Family Medicine

## 2020-04-12 NOTE — Progress Notes (Deleted)
Name: Mallory Stewart  Age/ Sex: 59 y.o., female   MRN/ DOB: [id number], Oct 13, 1960     PCP: [name], MD   Reason for Endocrinology Evaluation: Type 2 Diabetes Mellitus  Initial Endocrine Consultative Visit: 08/22/2018    PATIENT IDENTIFIER: Mallory Stewart is a 59 y.o. female with a past medical history of HTN, T2DM , chronic pancreatitis and gastric outlet obstruction. The patient has followed with Endocrinology clinic since 08/22/2018 for consultative assistance with management of her diabetes.  DIABETIC HISTORY:  Mallory Stewart was with  T2DM in 2010.Pt had hospital admission in 2014 for pancreatitis secondary to ETOH intake, with another one in 2018 and 2020. Insulin started in 07/2018 . Her hemoglobin A1c has ranged from 5.4%in 2016, peaking at13.3%In 2020.  Prandial insulin started 08/2018 Farxiga added in 09/2018  SUBJECTIVE:   During the last visit (08/19/2019): A1c 7.1 %, we continued tresiba and added [place]  Today (04/13/2020): Mallory Stewart is here for a follow up on diabetes management. She checks her blood sugars 1 times daily. The patient has  had hypoglycemic episodes since the last clinic visit, which typically occur 1x /week - most often occuring during the day ( mid-late afternoon ). The patient is symptomatic with these episodes, with symptoms of sweating and shaking    HOME DIABETES REGIMEN:  Tresiba 34 units daily  Farxiga 10 mg daily   Statin: No ACE-I/ARB: yes    METER DOWNLOAD SUMMARY: Did not bring     DIABETIC COMPLICATIONS: Microvascular complications:   Neuropathic   Denies: CKD  Last Eye Exam: Completed 2018  Macrovascular complications:   Denies: CAD, CVA, PVD   HISTORY:  Past Medical History:  Past Medical History:  Diagnosis Date  . Alcohol-induced chronic pancreatitis ([place])   . B12 deficiency   . Concussion   . DDD (degenerative disc disease), cervical   . Diabetes ([place])   . DKA, type 2 ([place])   . Gastric  outlet obstruction 08/12/2018  . Hypertension   . Neuropathy   . Seasonal allergies    Past Surgical History:  Past Surgical History:  Procedure Laterality Date  . BIOPSY  01/19/2019   Procedure: BIOPSY;  Surgeon: Rush Landmark Telford Nab., MD;  Location: [place];  Service: Gastroenterology;;  . CHOLECYSTECTOMY N/A 07/28/2016   Procedure: LAPAROSCOPIC CHOLECYSTECTOMY WITH INTRAOPERATIVE CHOLANGIOGRAM;  Surgeon: Mickeal Skinner, MD;  Location: [place];  Service: General;  Laterality: N/A;  . ESOPHAGOGASTRODUODENOSCOPY (EGD) WITH PROPOFOL N/A 08/13/2018   Procedure: ESOPHAGOGASTRODUODENOSCOPY (EGD) WITH PROPOFOL;  Surgeon: Irving Copas., MD;  Location: [place];  Service: Gastroenterology;  Laterality: N/A;  . ESOPHAGOGASTRODUODENOSCOPY (EGD) WITH PROPOFOL N/A 01/19/2019   Procedure: ESOPHAGOGASTRODUODENOSCOPY (EGD) WITH PROPOFOL;  Surgeon: Rush Landmark Telford Nab., MD;  Location: [place];  Service: Gastroenterology;  Laterality: N/A;  . LEEP  1990's  . ORIF FOOT FRACTURE  09/2008   L 5th metatarsal  . REFRACTIVE SURGERY  2000  . TONSILLECTOMY    . UPPER ESOPHAGEAL ENDOSCOPIC ULTRASOUND (EUS) N/A 08/13/2018   Procedure: UPPER ESOPHAGEAL ENDOSCOPIC ULTRASOUND (EUS);  Surgeon: Irving Copas., MD;  Location: [place];  Service: Gastroenterology;  Laterality: N/A;  . UPPER ESOPHAGEAL ENDOSCOPIC ULTRASOUND (EUS) N/A 01/19/2019   Procedure: UPPER ESOPHAGEAL ENDOSCOPIC ULTRASOUND (EUS);  Surgeon: Irving Copas., MD;  Location: [place];  Service: Gastroenterology;  Laterality: N/A;    Social History:  reports that she has never smoked. She has never used smokeless tobacco. She reports previous alcohol use of about  7.0 standard drinks of alcohol per week. She reports that she does not use drugs. Family History:  Family History  Problem Relation Age of Onset  . Other Mother        tachycardia.Marland KitchenMarland Kitchen?afib  . Stroke Father        after hernia suegery  .  Prostate cancer Father   . Other Father        global transient amnesia, unclear source  . Atrial fibrillation Sister   . Healthy Brother   . Healthy Brother   . Coronary artery disease Paternal Grandmother   . Heart attack Paternal Grandmother 27  . Brain cancer Maternal Grandfather        ?  Marland Kitchen Cancer Paternal Grandfather        ?  Marland Kitchen Breast cancer Maternal Grandmother      HOME MEDICATIONS: Allergies as of 04/13/2020      Reactions   Gluten Meal Other (See Comments)   Stomach distress   Zocor [simvastatin] Other (See Comments)   Elevated lfts?      Medication List       Accurate as of April 13, 2020  7:13 AM. If you have any questions, ask your nurse or doctor.        vitamin B-12 500 MCG tablet Commonly known as: CYANOCOBALAMIN Take 1,000 mcg by mouth daily.   B-12 1000 MCG Tbcr Take 2,000 mcg by mouth daily.   B-D UF III MINI PEN NEEDLES 31G X 5 MM Misc Generic drug: Insulin Pen Needle USE 4 TIMES A DAY AS DIRECTED   Farxiga 10 MG Tabs tablet Generic drug: dapagliflozin propanediol Take 10 mg by mouth daily before breakfast.   fluticasone 50 MCG/ACT nasal spray Commonly known as: FLONASE Place 2 sprays into both nostrils every morning.   levocetirizine 5 MG tablet Commonly known as: XYZAL Take 5 mg by mouth at bedtime.   lisinopril 40 MG tablet Commonly known as: ZESTRIL TAKE 1 TABLET BY MOUTH EVERY DAY   methocarbamol 500 MG tablet Commonly known as: ROBAXIN Take 1 tablet (500 mg total) by mouth every 8 (eight) hours as needed for muscle spasms.   metoprolol tartrate 50 MG tablet Commonly known as: LOPRESSOR TAKE 1 TABLET BY MOUTH TWICE A DAY   multivitamin with minerals Tabs tablet Take 1 tablet by mouth daily.   ONE TOUCH ULTRA 2 w/Device Kit Use to check blood sugars two times day   OneTouch Ultra test strip Generic drug: glucose blood CHECK BLOOD SUGAR TWO TIMES DAILY AS DIRECTED   onetouch ultrasoft lancets Use to check blood  sugar two times a day.   pantoprazole 40 MG tablet Commonly known as: PROTONIX Take 1 tablet (40 mg total) by mouth 2 (two) times daily before a meal.   Evaristo Bury FlexTouch 200 UNIT/ML FlexTouch Pen Generic drug: insulin degludec Inject 34 Units into the skin daily.        OBJECTIVE:   Vital Signs: There were no vitals taken for this visit.  Wt Readings from Last 3 Encounters:  11/03/19 189 lb 4 oz (85.8 kg)  08/24/19 187 lb (84.8 kg)  08/19/19 189 lb (85.7 kg)     Exam: General: Pt appears well and is in NAD  Neck: General: Supple without adenopathy. Thyroid: Thyroid size normal.  No goiter or nodules appreciated.  Lungs: Clear with good BS bilat with no rales, rhonchi, or wheezes  Heart: RRR with normal S1 and S2 and no gallops; no murmurs; no rub  Abdomen: Normoactive  bowel sounds, soft, nontender, without masses or organomegaly palpable  Extremities: No pretibial edema.   Neuro: MS is good with appropriate affect, pt is alert and Ox3    DM foot exam:  08/19/2019  The skin of the feet is intact without sores or ulcerations. The pedal pulses are 2+ on right and 2+ on left. The sensation is absent to a screening 5.07, 10 gram monofilament bilaterally        DATA REVIEWED:  Lab Results  Component Value Date   HGBA1C 7.1 (A) 08/19/2019   HGBA1C 7.0 (H) 10/16/2018   HGBA1C 13.3 (H) 07/23/2018   Lab Results  Component Value Date   MICROALBUR 1.6 08/19/2019   LDLCALC 82 10/16/2018   CREATININE 0.87 08/19/2019   Lab Results  Component Value Date   MICRALBCREAT 6.6 08/19/2019     Lab Results  Component Value Date   CHOL 201 (H) 10/16/2018   HDL 104.50 10/16/2018   LDLCALC 82 10/16/2018   LDLDIRECT 160.5 02/20/2012   TRIG 70.0 10/16/2018   CHOLHDL 2 10/16/2018       Results for Lebarron, Riti L "[name]" (MRN [id number]) as of 08/20/2019 07:36  Ref. Range 08/19/2019 08:47  Sodium Latest Ref Range: 135 - 145 mEq/L 131 (L)  Potassium Latest Ref Range: 3.5  - 5.1 mEq/L 4.6  Chloride Latest Ref Range: 96 - 112 mEq/L 99  CO2 Latest Ref Range: 19 - 32 mEq/L 22  Glucose Latest Ref Range: 70 - 99 mg/dL 255 (H)  BUN Latest Ref Range: 6 - 23 mg/dL 16  Creatinine Latest Ref Range: 0.40 - 1.20 mg/dL 0.87  Calcium Latest Ref Range: 8.4 - 10.5 mg/dL 9.7  GFR Latest Ref Range: >60.00 mL/min 67.21  MICROALB/CREAT RATIO Latest Ref Range: 0.0 - 30.0 mg/g 6.6  Creatinine,U Latest Units: mg/dL 24.6  Microalb, Ur Latest Ref Range: 0.0 - 1.9 mg/dL 1.6    ASSESSMENT / PLAN / RECOMMENDATIONS:   1) Type 2 Diabetes Mellitus, Optimally controlled, With Neuropathic complications - Most recent A1c of 7.1 %. Goal A1c < 7.0 %.     -A1c acceptable range -Patient endorses hypoglycemic symptoms sometimes with BG's in the 90s, I explained to the patient that low BG is anything below 70 mg/DL, the fact that she is symptomatic with normal BG's indicates that she does tend to have higher BG's. -The patient has been taking [place] 2 hours prior to breakfast, I have advised her to start taking it with breakfast and if she continues to have tight BG's during the day she may cut down the [place] to 30 units daily -BMP today is normal    She is intolerant to Metformin, she is not a candidate for GLP-1 agonists, or DPP-4 inhibitors due to hx of pancreatitis.   MEDICATIONS: -Continue Tresiba 34 units daily -Continue Farxiga 10 mg daily with breakfast    EDUCATION / INSTRUCTIONS:  BG monitoring instructions: Patient is instructed to check her blood sugars 1 times a day.  Call [facility] if: BG persistently < 70  . I reviewed the Rule of 15 for the treatment of hypoglycemia in detail with the patient. Literature supplied.   2) Diabetic complications:   Eye: Does not have known diabetic retinopathy.   Neuro/ Feet: Does have known diabetic peripheral neuropathy.  Renal: Patient does not have known baseline CKD. She is on an ACEI/ARB at present.  Normal MA/Cr ratio    F/U in 4 months   Signed electronically by: Mack Guise,  MD  [facility]  [facility] [address] [place], [place] [address] Phone: [phone] FAX: [phone]   CC: [name], MD [place] [address] Phone: [phone]  Fax: [phone]  Return to Endocrinology clinic as below: Future Appointments  Date Time Provider [place]  04/13/2020  7:30 AM Shaneque Merkle, Melanie Crazier, MD [id number] None

## 2020-04-13 ENCOUNTER — Ambulatory Visit: Payer: Self-pay | Admitting: Internal Medicine

## 2020-04-16 ENCOUNTER — Other Ambulatory Visit: Payer: Self-pay | Admitting: Internal Medicine

## 2020-05-05 ENCOUNTER — Telehealth: Payer: Self-pay | Admitting: Family Medicine

## 2020-05-05 MED ORDER — LISINOPRIL 40 MG PO TABS: 40.0000 mg | ORAL_TABLET | Freq: Every day | ORAL | 0 refills | Status: DC

## 2020-05-05 NOTE — Telephone Encounter (Signed)
Please call and schedule CPE with fasting labs prior with Dr. Bedsole. 

## 2020-05-05 NOTE — Telephone Encounter (Signed)
Patient has called in stating that she has changed pharmacies. Patient is out of her Lisinopril. Please send to [facility] [address] [facility] in [place], [place]

## 2020-05-05 NOTE — Addendum Note (Signed)
Addended by: Damita Lack on: 05/05/2020 02:27 PM   Modules accepted: Orders

## 2020-05-07 ENCOUNTER — Other Ambulatory Visit: Payer: Self-pay | Admitting: Family Medicine

## 2020-05-11 ENCOUNTER — Other Ambulatory Visit: Payer: Self-pay | Admitting: Internal Medicine

## 2020-05-13 ENCOUNTER — Encounter: Payer: Self-pay | Admitting: Family Medicine

## 2020-05-13 ENCOUNTER — Other Ambulatory Visit: Payer: Self-pay

## 2020-05-13 ENCOUNTER — Ambulatory Visit: Payer: 59 | Admitting: Family Medicine

## 2020-05-13 VITALS — BP 120/80 | HR 85 | Temp 97.5°F | Ht 68.25 in | Wt 203.2 lb

## 2020-05-13 DIAGNOSIS — R053 Chronic cough: Secondary | ICD-10-CM | POA: Diagnosis not present

## 2020-05-13 DIAGNOSIS — J3089 Other allergic rhinitis: Secondary | ICD-10-CM | POA: Diagnosis not present

## 2020-05-13 MED ORDER — AZELASTINE HCL 0.1 % NA SOLN: 2.0000 | Freq: Two times a day (BID) | NASAL | 12 refills | Status: DC

## 2020-05-13 MED ORDER — AZELASTINE HCL 0.05 % OP SOLN: 1.0000 [drp] | Freq: Two times a day (BID) | OPHTHALMIC | 12 refills | Status: DC

## 2020-05-13 NOTE — Patient Instructions (Addendum)
Stop  Xyzal and  change  To Over-the-counter Zyrtec at bedtime.  Start prescription topical eye drops for allergies as needed.  Change Flonase to prescription antihistamine nose spray.  Let me know if you are interested in referral to allergist for allergy testing.

## 2020-05-13 NOTE — Progress Notes (Signed)
Patient ID: Mallory Stewart, female    DOB: 16-Feb-1961, 60 y.o.   MRN: [id number]  This visit was conducted in person.  BP 120/80   Pulse 85   Temp (!) 97.5 F (36.4 C) (Temporal)   Ht 5' 8.25" (1.734 m)   Wt 203 lb 4 oz (92.2 kg)   SpO2 96%   BMI 30.68 kg/m    CC:  Chronic cough Subjective:   HPI: Mallory Stewart is a 60 y.o. female presenting on 05/13/2020 for Cough (with huge chucks of mucus when waking up in the morning-Going on since Thanksgiving), Nasal Congestion (Nose drips all day long "goes through box of tissues a day"), and Ear Drainage    She report 2 months of rhinorrhea as well as productive cough. She wakes with productive cough, post nasal drip. occ post nasal drip makes her gag. No nasal congetion.. more runny.Marland Kitchen drips all day. Mostly clear nasal discharge.  No ear pain, but when blows her nose her ears squeak, feel full. No clear ear discharge , but feel fluid moving to side of head when Occ facial pain.  No SOB, no wheeze. No fever, no myalgia.  Eyes burn and are itchy. She feels tired.  She is using max flonase. Using Xyzal nightly for long time.   She has a new puppy. Since spring.. symptoms did not resolve when went on trip.  Unclear triggers.   reflux is well controlled on pantoprazole  Non smoker On lisinopril for years    COVID testing in last 2 weeks.. negative.  She has had 2 COVID vaccines.  Relevant past medical, surgical, family and social history reviewed and updated as indicated. Interim medical history since our last visit reviewed. Allergies and medications reviewed and updated. Outpatient Medications Prior to Visit  Medication Sig Dispense Refill  . B-D UF III MINI PEN NEEDLES 31G X 5 MM MISC USE 4 TIMES A DAY AS DIRECTED 100 each 3  . Blood Glucose Monitoring Suppl (ONE TOUCH ULTRA 2) w/Device KIT Use to check blood sugars two times day 1 each 0  . Cyanocobalamin (B-12) 1000 MCG TBCR Take 2,000 mcg by mouth daily. 30 tablet    . dapagliflozin propanediol (FARXIGA) 10 MG TABS tablet Take 10 mg by mouth daily before breakfast. 90 tablet 3  . fluticasone (FLONASE) 50 MCG/ACT nasal spray Place 2 sprays into both nostrils every morning.    . Lancets (ONETOUCH ULTRASOFT) lancets Use to check blood sugar two times a day. 200 each 3  . levocetirizine (XYZAL) 5 MG tablet Take 5 mg by mouth at bedtime.    Marland Kitchen lisinopril (ZESTRIL) 40 MG tablet Take 1 tablet (40 mg total) by mouth daily. 90 tablet 0  . methocarbamol (ROBAXIN) 500 MG tablet Take 1 tablet (500 mg total) by mouth every 8 (eight) hours as needed for muscle spasms. 20 tablet 0  . metoprolol tartrate (LOPRESSOR) 50 MG tablet TAKE 1 TABLET BY MOUTH TWICE A DAY 180 tablet 0  . Multiple Vitamin (MULTIVITAMIN WITH MINERALS) TABS tablet Take 1 tablet by mouth daily.    Glory Rosebush ULTRA test strip CHECK BLOOD SUGAR TWO TIMES DAILY AS DIRECTED 100 strip 7  . pantoprazole (PROTONIX) 40 MG tablet TAKE 1 TABLET (40 MG TOTAL) BY MOUTH 2 (TWO) TIMES DAILY BEFORE A MEAL. 60 tablet 1  . TRESIBA FLEXTOUCH 200 UNIT/ML FlexTouch Pen INJECT 34 UNITS INTO THE SKIN DAILY. 9 mL 3  . vitamin B-12 (CYANOCOBALAMIN) 500 MCG tablet Take  1,000 mcg by mouth daily.     No facility-administered medications prior to visit.     Per HPI unless specifically indicated in ROS section below Review of Systems  Constitutional: Negative for fatigue and fever.  HENT: Positive for congestion and postnasal drip.   Eyes: Negative for pain.  Respiratory: Positive for cough. Negative for shortness of breath.   Cardiovascular: Negative for chest pain, palpitations and leg swelling.  Gastrointestinal: Negative for abdominal pain.  Genitourinary: Negative for dysuria and vaginal bleeding.  Musculoskeletal: Negative for back pain.  Neurological: Negative for syncope, light-headedness and headaches.  Psychiatric/Behavioral: Negative for dysphoric mood.   Objective:  BP 120/80   Pulse 85   Temp (!) 97.5 F  (36.4 C) (Temporal)   Ht 5' 8.25" (1.734 m)   Wt 203 lb 4 oz (92.2 kg)   SpO2 96%   BMI 30.68 kg/m   Wt Readings from Last 3 Encounters:  05/13/20 203 lb 4 oz (92.2 kg)  11/03/19 189 lb 4 oz (85.8 kg)  08/24/19 187 lb (84.8 kg)      Physical Exam Constitutional:      General: She is not in acute distress.Vital signs are normal.     Appearance: Normal appearance. She is well-developed and well-nourished. She is not ill-appearing or toxic-appearing.  HENT:     Head: Normocephalic.     Right Ear: Hearing, tympanic membrane, ear canal and external ear normal. Tympanic membrane is not erythematous, retracted or bulging.     Left Ear: Hearing, tympanic membrane, ear canal and external ear normal. Tympanic membrane is not erythematous, retracted or bulging.     Nose: No mucosal edema or rhinorrhea.     Right Sinus: No maxillary sinus tenderness or frontal sinus tenderness.     Left Sinus: No maxillary sinus tenderness or frontal sinus tenderness.     Mouth/Throat:     Mouth: Oropharynx is clear and moist and mucous membranes are normal.     Pharynx: Uvula midline.  Eyes:     General: Lids are normal. Lids are everted, no foreign bodies appreciated.     Extraocular Movements: EOM normal.     Conjunctiva/sclera: Conjunctivae normal.     Pupils: Pupils are equal, round, and reactive to light.  Neck:     Thyroid: No thyroid mass or thyromegaly.     Vascular: No carotid bruit.     Trachea: Trachea normal.  Cardiovascular:     Rate and Rhythm: Normal rate and regular rhythm.     Pulses: Normal pulses and intact distal pulses.     Heart sounds: Normal heart sounds, S1 normal and S2 normal. No murmur heard. No friction rub. No gallop.   Pulmonary:     Effort: Pulmonary effort is normal. No tachypnea or respiratory distress.     Breath sounds: Normal breath sounds. No decreased breath sounds, wheezing, rhonchi or rales.  Abdominal:     General: Bowel sounds are normal.     Palpations:  Abdomen is soft.     Tenderness: There is no abdominal tenderness.  Musculoskeletal:     Cervical back: Normal range of motion and neck supple.  Skin:    General: Skin is warm, dry and intact.     Findings: No rash.  Neurological:     Mental Status: She is alert.  Psychiatric:        Mood and Affect: Mood is not anxious or depressed.        Speech: Speech normal.  Behavior: Behavior normal. Behavior is cooperative.        Thought Content: Thought content normal.        Cognition and Memory: Cognition and memory normal.        Judgment: Judgment normal.       Results for orders placed or performed in visit on 08/19/19  Microalbumin / creatinine urine ratio  Result Value Ref Range   Microalb, Ur 1.6 0.0 - 1.9 mg/dL   Creatinine,U 24.6 mg/dL   Microalb Creat Ratio 6.6 0.0 - 30.0 mg/g  Basic metabolic panel  Result Value Ref Range   Sodium 131 (L) 135 - 145 mEq/L   Potassium 4.6 3.5 - 5.1 mEq/L   Chloride 99 96 - 112 mEq/L   CO2 22 19 - 32 mEq/L   Glucose, Bld 255 (H) 70 - 99 mg/dL   BUN 16 6 - 23 mg/dL   Creatinine, Ser 0.87 0.40 - 1.20 mg/dL   GFR 67.21 >60.00 mL/min   Calcium 9.7 8.4 - 10.5 mg/dL  POCT HgB A1C  Result Value Ref Range   Hemoglobin A1C 7.1 (A) 4.0 - 5.6 %   HbA1c POC (<> result, manual entry)     HbA1c, POC (prediabetic range)     HbA1c, POC (controlled diabetic range)    POCT Glucose (CBG)  Result Value Ref Range   POC Glucose 248 (A) 70 - 99 mg/dl    This visit occurred during the SARS-CoV-2 public health emergency.  Safety protocols were in place, including screening questions prior to the visit, additional usage of staff PPE, and extensive cleaning of exam room while observing appropriate contact time as indicated for disinfecting solutions.   COVID 19 screen:  No recent travel or known exposure to COVID19 The patient denies respiratory symptoms of COVID 19 at this time. The importance of social distancing was discussed today.   Assessment  and Plan    Problem List Items Addressed This Visit    Allergic rhinitis - Primary    Stop  Xyzal and  change  To Over-the-counter Zyrtec at bedtime.  Start prescription topical eye drops for allergies as needed.  Change Flonase to prescription antihistamine nose spray.  if not improving.. consider  referral to allergist for allergy testing.      Persistent cough for 3 weeks or longer    Most likely secondary to  alleriges.   Non smoker.          Eliezer Lofts, MD

## 2020-05-14 ENCOUNTER — Other Ambulatory Visit: Payer: Self-pay | Admitting: Internal Medicine

## 2020-05-16 ENCOUNTER — Telehealth: Payer: Self-pay | Admitting: Family Medicine

## 2020-05-16 ENCOUNTER — Ambulatory Visit: Payer: Self-pay | Admitting: Internal Medicine

## 2020-05-16 ENCOUNTER — Encounter: Payer: Self-pay | Admitting: Internal Medicine

## 2020-05-16 DIAGNOSIS — E7800 Pure hypercholesterolemia, unspecified: Secondary | ICD-10-CM

## 2020-05-16 DIAGNOSIS — E538 Deficiency of other specified B group vitamins: Secondary | ICD-10-CM

## 2020-05-16 DIAGNOSIS — E1142 Type 2 diabetes mellitus with diabetic polyneuropathy: Secondary | ICD-10-CM

## 2020-05-16 NOTE — Telephone Encounter (Signed)
-----   Message from Aquilla Solian, RT sent at 05/16/2020  1:54 PM EST ----- Regarding: Lab Orders for Monday 1.24.2022 Please place lab orders for Monday 1.24.2022, office visit for physical on Thursday 2.3.2022 Thank you, Jones Bales RT(R)

## 2020-05-16 NOTE — Progress Notes (Deleted)
Name: Mallory Stewart  Age/ Sex: 60 y.o., female   MRN/ DOB: [id number], [birth year]     PCP: [name], MD   Reason for Endocrinology Evaluation: Type 2 Diabetes Mellitus  Initial Endocrine Consultative Visit: 08/22/2018    PATIENT IDENTIFIER: Mallory Stewart is a 60 y.o. female with a past medical history of HTN, T2DM , chronic pancreatitis and gastric outlet obstruction. The patient has followed with Endocrinology clinic since 08/22/2018 for consultative assistance with management of her diabetes.  DIABETIC HISTORY:  Mallory Stewart was with  T2DM in 2010.Pt had hospital admission in 2014 for pancreatitis secondary to ETOH intake, with another one in 2018 and 2020. Insulin started in 07/2018 . Her hemoglobin A1c has ranged from 5.4%in 2016, peaking at13.3%In 2020.  Prandial insulin started 08/2018 Farxiga added in 09/2018  SUBJECTIVE:   During the last visit (08/19/2019): A1c 7.1%  we continued tresiba and [place]  Today (05/16/2020): Mallory Stewart is here for a follow up on diabetes management. She has not been here since 09/2018.  She checks her blood sugars 1 times daily. The patient has  had hypoglycemic episodes since the last clinic visit, which typically occur 1x /week - most often occuring during the day ( mid-late afternoon ). The patient is symptomatic with these episodes, with symptoms of sweating and shaking . Otherwise, the patient has not required any recent emergency interventions for hypoglycemia but was admitted for alcohol induced pancreatitis 01/2019     HOME DIABETES REGIMEN:  Tresiba 34 units daily  Farxiga 10 mg daily   Statin: No ACE-I/ARB: yes    METER DOWNLOAD SUMMARY: Did not bring     DIABETIC COMPLICATIONS: Microvascular complications:   Neuropathic   Denies: CKD  Last Eye Exam: Completed 2018  Macrovascular complications:   Denies: CAD, CVA,  PVD   HISTORY:  Past Medical History:  Past Medical History:  Diagnosis Date  . Alcohol-induced chronic pancreatitis ([place])   . B12 deficiency   . Concussion   . DDD (degenerative disc disease), cervical   . Diabetes ([place])   . DKA, type 2 ([place])   . Gastric outlet obstruction 08/12/2018  . Hypertension   . Neuropathy   . Seasonal allergies    Past Surgical History:  Past Surgical History:  Procedure Laterality Date  . BIOPSY  01/19/2019   Procedure: BIOPSY;  Surgeon: Rush Landmark Telford Nab., MD;  Location: [facility];  Service: Gastroenterology;;  . CHOLECYSTECTOMY N/A 07/28/2016   Procedure: LAPAROSCOPIC CHOLECYSTECTOMY WITH INTRAOPERATIVE CHOLANGIOGRAM;  Surgeon: Mickeal Skinner, MD;  Location: [facility];  Service: General;  Laterality: N/A;  . ESOPHAGOGASTRODUODENOSCOPY (EGD) WITH PROPOFOL N/A 08/13/2018   Procedure: ESOPHAGOGASTRODUODENOSCOPY (EGD) WITH PROPOFOL;  Surgeon: Irving Copas., MD;  Location: [facility];  Service: Gastroenterology;  Laterality: N/A;  . ESOPHAGOGASTRODUODENOSCOPY (EGD) WITH PROPOFOL N/A 01/19/2019   Procedure: ESOPHAGOGASTRODUODENOSCOPY (EGD) WITH PROPOFOL;  Surgeon: Rush Landmark Telford Nab., MD;  Location: [facility];  Service: Gastroenterology;  Laterality: N/A;  . LEEP  1990's  . ORIF FOOT FRACTURE  09/2008   L 5th metatarsal  . REFRACTIVE SURGERY  2000  . TONSILLECTOMY    . UPPER ESOPHAGEAL ENDOSCOPIC ULTRASOUND (EUS) N/A 08/13/2018   Procedure: UPPER ESOPHAGEAL ENDOSCOPIC ULTRASOUND (EUS);  Surgeon: Irving Copas., MD;  Location: [facility];  Service: Gastroenterology;  Laterality: N/A;  . UPPER ESOPHAGEAL ENDOSCOPIC ULTRASOUND (EUS) N/A 01/19/2019   Procedure: UPPER ESOPHAGEAL ENDOSCOPIC ULTRASOUND (EUS);  Surgeon: Rush Landmark Telford Nab., MD;  Location: [facility];  Service:  Gastroenterology;  Laterality: N/A;    Social History:  reports that she has never smoked. She has never used smokeless tobacco. She reports previous  alcohol use of about 7.0 standard drinks of alcohol per week. She reports that she does not use drugs. Family History:  Family History  Problem Relation Age of Onset  . Other Mother        tachycardia.Marland KitchenMarland Kitchen?afib  . Stroke Father        after hernia suegery  . Prostate cancer Father   . Other Father        global transient amnesia, unclear source  . Atrial fibrillation Sister   . Healthy Brother   . Healthy Brother   . Coronary artery disease Paternal Grandmother   . Heart attack Paternal Grandmother 40  . Brain cancer Maternal Grandfather        ?  Marland Kitchen Cancer Paternal Grandfather        ?  Marland Kitchen Breast cancer Maternal Grandmother      HOME MEDICATIONS: Allergies as of 05/16/2020      Reactions   Gluten Meal Other (See Comments)   Stomach distress   Zocor [simvastatin] Other (See Comments)   Elevated lfts?      Medication List       Accurate as of May 16, 2020  7:05 AM. If you have any questions, ask your nurse or doctor.        azelastine 0.05 % ophthalmic solution Commonly known as: OPTIVAR Place 1 drop into both eyes 2 (two) times daily.   azelastine 0.1 % nasal spray Commonly known as: ASTELIN Place 2 sprays into both nostrils 2 (two) times daily. Use in each nostril as directed   vitamin B-12 500 MCG tablet Commonly known as: CYANOCOBALAMIN Take 1,000 mcg by mouth daily.   B-12 1000 MCG Tbcr Take 2,000 mcg by mouth daily.   B-D UF III MINI PEN NEEDLES 31G X 5 MM Misc Generic drug: Insulin Pen Needle USE 4 TIMES A DAY AS DIRECTED   Farxiga 10 MG Tabs tablet Generic drug: dapagliflozin propanediol Take 10 mg by mouth daily before breakfast.   fluticasone 50 MCG/ACT nasal spray Commonly known as: FLONASE Place 2 sprays into both nostrils every morning.   levocetirizine 5 MG tablet Commonly known as: XYZAL Take 5 mg by mouth at bedtime.   lisinopril 40 MG tablet Commonly known as: ZESTRIL Take 1 tablet (40 mg total) by mouth daily.   methocarbamol  500 MG tablet Commonly known as: ROBAXIN Take 1 tablet (500 mg total) by mouth every 8 (eight) hours as needed for muscle spasms.   metoprolol tartrate 50 MG tablet Commonly known as: LOPRESSOR TAKE 1 TABLET BY MOUTH TWICE A DAY   multivitamin with minerals Tabs tablet Take 1 tablet by mouth daily.   ONE TOUCH ULTRA 2 w/Device Kit Use to check blood sugars two times day   OneTouch Ultra test strip Generic drug: glucose blood CHECK BLOOD SUGAR TWO TIMES DAILY AS DIRECTED   onetouch ultrasoft lancets Use to check blood sugar two times a day.   pantoprazole 40 MG tablet Commonly known as: PROTONIX TAKE 1 TABLET (40 MG TOTAL) BY MOUTH 2 (TWO) TIMES DAILY BEFORE A MEAL.   Tyler Aas FlexTouch 200 UNIT/ML FlexTouch Pen Generic drug: insulin degludec INJECT 34 UNITS INTO THE SKIN DAILY.        OBJECTIVE:   Vital Signs: There were no vitals taken for this visit.  Wt Readings from Last 3 Encounters:  05/13/20 203 lb 4 oz (92.2 kg)  11/03/19 189 lb 4 oz (85.8 kg)  08/24/19 187 lb (84.8 kg)     Exam: General: Pt appears well and is in NAD  Neck: General: Supple without adenopathy. Thyroid: Thyroid size normal.  No goiter or nodules appreciated.  Lungs: Clear with good BS bilat with no rales, rhonchi, or wheezes  Heart: RRR with normal S1 and S2 and no gallops; no murmurs; no rub  Abdomen: Normoactive bowel sounds, soft, nontender, without masses or organomegaly palpable  Extremities: No pretibial edema.   Neuro: MS is good with appropriate affect, pt is alert and Ox3    DM foot exam:  08/19/2019  The skin of the feet is intact without sores or ulcerations. The pedal pulses are 2+ on right and 2+ on left. The sensation is absent to a screening 5.07, 10 gram monofilament bilaterally        DATA REVIEWED:  Lab Results  Component Value Date   HGBA1C 7.1 (A) 08/19/2019   HGBA1C 7.0 (H) 10/16/2018   HGBA1C 13.3 (H) 07/23/2018   Lab Results  Component Value Date    MICROALBUR 1.6 08/19/2019   LDLCALC 82 10/16/2018   CREATININE 0.87 08/19/2019   Lab Results  Component Value Date   MICRALBCREAT 6.6 08/19/2019     Lab Results  Component Value Date   CHOL 201 (H) 10/16/2018   HDL 104.50 10/16/2018   LDLCALC 82 10/16/2018   LDLDIRECT 160.5 02/20/2012   TRIG 70.0 10/16/2018   CHOLHDL 2 10/16/2018       Results for Beavin, Mallory L "[name]" (MRN [id number]) as of 08/20/2019 07:36  Ref. Range 08/19/2019 08:47  Sodium Latest Ref Range: 135 - 145 mEq/L 131 (L)  Potassium Latest Ref Range: 3.5 - 5.1 mEq/L 4.6  Chloride Latest Ref Range: 96 - 112 mEq/L 99  CO2 Latest Ref Range: 19 - 32 mEq/L 22  Glucose Latest Ref Range: 70 - 99 mg/dL 255 (H)  BUN Latest Ref Range: 6 - 23 mg/dL 16  Creatinine Latest Ref Range: 0.40 - 1.20 mg/dL 0.87  Calcium Latest Ref Range: 8.4 - 10.5 mg/dL 9.7  GFR Latest Ref Range: >60.00 mL/min 67.21  MICROALB/CREAT RATIO Latest Ref Range: 0.0 - 30.0 mg/g 6.6  Creatinine,U Latest Units: mg/dL 24.6  Microalb, Ur Latest Ref Range: 0.0 - 1.9 mg/dL 1.6    ASSESSMENT / PLAN / RECOMMENDATIONS:   1) Type 2 Diabetes Mellitus, Optimally controlled, With Neuropathic complications - Most recent A1c of 7.1 %. Goal A1c < 7.0 %.     -A1c acceptable range -Patient endorses hypoglycemic symptoms sometimes with BG's in the 90s, I explained to the patient that low BG is anything below 70 mg/DL, the fact that she is symptomatic with normal BG's indicates that she does tend to have higher BG's. -The patient has been taking Farxiga 2 hours prior to breakfast, I have advised her to start taking it with breakfast and if she continues to have tight BG's during the day she may cut down the Tresiba to 30 units daily -BMP today is normal    She is intolerant to Metformin, she is not a candidate for GLP-1 agonists, or DPP-4 inhibitors due to hx of pancreatitis.   MEDICATIONS: -Continue Tresiba 34 units daily -Continue Farxiga 10 mg daily with  breakfast    EDUCATION / INSTRUCTIONS:  BG monitoring instructions: Patient is instructed to check her blood sugars 1 times a day.  Call [facility] if: BG   persistently < 70 or > 300. . I reviewed the Rule of 15 for the treatment of hypoglycemia in detail with the patient. Literature supplied.   2) Diabetic complications:   Eye: Does not have known diabetic retinopathy.   Neuro/ Feet: Does have known diabetic peripheral neuropathy.  Renal: Patient does not have known baseline CKD. She is on an ACEI/ARB at present. Normal MA/Cr ratio    F/U in 4 months   Signed electronically by: Abby Jaralla , MD  [facility]  [facility] [address] [place], [place] [address] Phone: [phone] FAX: [phone]   CC: Bedsole, Amy E, MD [address] [place] [address] Phone: [phone]  Fax: [phone]  Return to Endocrinology clinic as below: Future Appointments  Date Time Provider Department Center  05/16/2020  7:50 AM ,  Jaralla, MD [facility] None  05/30/2020  8:40 AM [facility]  06/09/2020  8:40 AM Bedsole, Amy E, MD [facility]      

## 2020-05-30 ENCOUNTER — Other Ambulatory Visit: Payer: Self-pay

## 2020-06-02 ENCOUNTER — Telehealth: Payer: Self-pay | Admitting: Family Medicine

## 2020-06-02 MED ORDER — METOPROLOL TARTRATE 50 MG PO TABS: 50.0000 mg | ORAL_TABLET | Freq: Two times a day (BID) | ORAL | 0 refills | Status: DC

## 2020-06-02 NOTE — Telephone Encounter (Signed)
Patient is calling in needing [place] to send in a refill for  Metoprolol tartrate  Pharmacy: [facility] [address] [place], [place]

## 2020-06-02 NOTE — Telephone Encounter (Signed)
Refill sent as requested. 

## 2020-06-08 ENCOUNTER — Telehealth: Payer: Self-pay | Admitting: Internal Medicine

## 2020-06-08 MED ORDER — TOUJEO MAX SOLOSTAR 300 UNIT/ML ~~LOC~~ SOPN: 34.0000 [IU] | PEN_INJECTOR | Freq: Every day | SUBCUTANEOUS | 6 refills | Status: DC

## 2020-06-08 NOTE — Telephone Encounter (Signed)
Patient called to advise that Mallory Stewart is no longer covered by her insurance.  States she was advised that Toujeo would be covered  Patient requesting changed medication be sent to [facility] on [address] back # [phone]

## 2020-06-08 NOTE — Telephone Encounter (Signed)
Patient have been notified.  

## 2020-06-09 ENCOUNTER — Encounter: Payer: Self-pay | Admitting: Family Medicine

## 2020-06-10 ENCOUNTER — Telehealth: Payer: Self-pay | Admitting: Internal Medicine

## 2020-06-10 NOTE — Telephone Encounter (Signed)
Pt called because her Toujeo prescription was wrote for 3 viles but pt only received 1. The package says 3 as well so she is confused why she received 1. The pharmacy told her the vile she received is equivalent to 3 but pt said that doesn't sound right and wants clarification from a nurse.   Pt also had a question regarding her dosage. On the prescription is says "take at 2pm" but pt states she took her other medication in the morning so she wanted to see if she had to take exactly at 2pm or if that time could be a little different?   Callback# [phone]

## 2020-06-13 ENCOUNTER — Telehealth: Payer: Self-pay | Admitting: Internal Medicine

## 2020-06-13 NOTE — Telephone Encounter (Signed)
Message left for patient to return my call.  

## 2020-06-13 NOTE — Telephone Encounter (Signed)
Pt returning Chan's call. Callback # [phone]

## 2020-06-13 NOTE — Telephone Encounter (Signed)
Spoken to patient and inform how the prefilled pen are.

## 2020-06-13 NOTE — Telephone Encounter (Signed)
Left message for patient

## 2020-06-13 NOTE — Telephone Encounter (Signed)
Message left for patient to return my call.  These pen are 300 units/mL. One prefilled pen is 450 units/1.5 ml

## 2020-06-13 NOTE — Telephone Encounter (Signed)
Mallory Stewart E 1 hour ago (12:10 PM)     Pt returning Chan's call. Callback # [phone]

## 2020-06-16 ENCOUNTER — Other Ambulatory Visit (INDEPENDENT_AMBULATORY_CARE_PROVIDER_SITE_OTHER): Payer: 59

## 2020-06-16 ENCOUNTER — Other Ambulatory Visit: Payer: Self-pay

## 2020-06-16 DIAGNOSIS — Z794 Long term (current) use of insulin: Secondary | ICD-10-CM | POA: Diagnosis not present

## 2020-06-16 DIAGNOSIS — E1142 Type 2 diabetes mellitus with diabetic polyneuropathy: Secondary | ICD-10-CM | POA: Diagnosis not present

## 2020-06-16 DIAGNOSIS — E538 Deficiency of other specified B group vitamins: Secondary | ICD-10-CM | POA: Diagnosis not present

## 2020-06-16 LAB — COMPREHENSIVE METABOLIC PANEL
ALT: 18 U/L (ref 0–35)
AST: 61 U/L — ABNORMAL HIGH (ref 0–37)
Albumin: 3.4 g/dL — ABNORMAL LOW (ref 3.5–5.2)
Alkaline Phosphatase: 215 U/L — ABNORMAL HIGH (ref 39–117)
BUN: 6 mg/dL (ref 6–23)
CO2: 28 mEq/L (ref 19–32)
Calcium: 9 mg/dL (ref 8.4–10.5)
Chloride: 99 mEq/L (ref 96–112)
Creatinine, Ser: 0.94 mg/dL (ref 0.40–1.20)
GFR: 67.41 mL/min (ref >60.00)
Glucose, Bld: 135 mg/dL — ABNORMAL HIGH (ref 70–99)
Potassium: 4.7 mEq/L (ref 3.5–5.1)
Sodium: 133 mEq/L — ABNORMAL LOW (ref 135–145)
Total Bilirubin: 1.4 mg/dL — ABNORMAL HIGH (ref 0.2–1.2)
Total Protein: 7 g/dL (ref 6.0–8.3)

## 2020-06-16 LAB — LIPID PANEL
Cholesterol: 168 mg/dL (ref 0–200)
HDL: 18.1 mg/dL — ABNORMAL LOW (ref >39.00)
NonHDL: 149.45
Total CHOL/HDL Ratio: 9
Triglycerides: 236 mg/dL — ABNORMAL HIGH (ref 0.0–149.0)
VLDL: 47.2 mg/dL — ABNORMAL HIGH (ref 0.0–40.0)

## 2020-06-16 LAB — HEMOGLOBIN A1C: Hgb A1c MFr Bld: 5.8 % (ref 4.6–6.5)

## 2020-06-16 LAB — VITAMIN B12: Vitamin B-12: 314 pg/mL (ref 211–911)

## 2020-06-16 LAB — LDL CHOLESTEROL, DIRECT: Direct LDL: 129 mg/dL

## 2020-06-21 ENCOUNTER — Ambulatory Visit (INDEPENDENT_AMBULATORY_CARE_PROVIDER_SITE_OTHER): Payer: 59 | Admitting: Family Medicine

## 2020-06-21 ENCOUNTER — Other Ambulatory Visit: Payer: Self-pay

## 2020-06-21 ENCOUNTER — Encounter: Payer: Self-pay | Admitting: Family Medicine

## 2020-06-21 VITALS — BP 120/76 | HR 89 | Temp 97.1°F | Ht 68.5 in | Wt 196.2 lb

## 2020-06-21 DIAGNOSIS — G5793 Unspecified mononeuropathy of bilateral lower limbs: Secondary | ICD-10-CM | POA: Diagnosis not present

## 2020-06-21 DIAGNOSIS — J3089 Other allergic rhinitis: Secondary | ICD-10-CM

## 2020-06-21 DIAGNOSIS — E1159 Type 2 diabetes mellitus with other circulatory complications: Secondary | ICD-10-CM

## 2020-06-21 DIAGNOSIS — Z794 Long term (current) use of insulin: Secondary | ICD-10-CM

## 2020-06-21 DIAGNOSIS — Z1211 Encounter for screening for malignant neoplasm of colon: Secondary | ICD-10-CM

## 2020-06-21 DIAGNOSIS — K860 Alcohol-induced chronic pancreatitis: Secondary | ICD-10-CM | POA: Diagnosis not present

## 2020-06-21 DIAGNOSIS — E785 Hyperlipidemia, unspecified: Secondary | ICD-10-CM

## 2020-06-21 DIAGNOSIS — Z0000 Encounter for general adult medical examination without abnormal findings: Secondary | ICD-10-CM | POA: Diagnosis not present

## 2020-06-21 DIAGNOSIS — E1169 Type 2 diabetes mellitus with other specified complication: Secondary | ICD-10-CM | POA: Diagnosis not present

## 2020-06-21 DIAGNOSIS — J309 Allergic rhinitis, unspecified: Secondary | ICD-10-CM | POA: Insufficient documentation

## 2020-06-21 DIAGNOSIS — R0982 Postnasal drip: Secondary | ICD-10-CM | POA: Insufficient documentation

## 2020-06-21 DIAGNOSIS — I152 Hypertension secondary to endocrine disorders: Secondary | ICD-10-CM

## 2020-06-21 DIAGNOSIS — R748 Abnormal levels of other serum enzymes: Secondary | ICD-10-CM | POA: Insufficient documentation

## 2020-06-21 DIAGNOSIS — E786 Lipoprotein deficiency: Secondary | ICD-10-CM | POA: Insufficient documentation

## 2020-06-21 LAB — HM DIABETES FOOT EXAM

## 2020-06-21 MED ORDER — LOSARTAN POTASSIUM 100 MG PO TABS: 100.0000 mg | ORAL_TABLET | Freq: Every day | ORAL | 11 refills | Status: DC

## 2020-06-21 MED ORDER — AMITRIPTYLINE HCL 25 MG PO TABS: 25.0000 mg | ORAL_TABLET | Freq: Every day | ORAL | 1 refills | Status: DC

## 2020-06-21 MED ORDER — ALPRAZOLAM 0.25 MG PO TABS: 0.2500 mg | ORAL_TABLET | Freq: Every day | ORAL | 0 refills | Status: DC | PRN

## 2020-06-21 NOTE — Assessment & Plan Note (Addendum)
Chronic, worsening Last neuro eval... she was told " nerves are beautiful"... but no sensation in feet and lower legs, pain worsening.  Will try low dose trial of amitriptyline.. likely will need to titrate up.  Neurontin not effective in past even at high doses. Narcotics not very effective and made her feel terrible.

## 2020-06-21 NOTE — Assessment & Plan Note (Addendum)
Very well controlled on Toujeo 34 units daily, but frequent lows.. drop to 30 units daily and follow up as planned with Dr. Lonzo Cloud. Continue farxiga for now. Increase healthy po intake.

## 2020-06-21 NOTE — Progress Notes (Signed)
Patient ID: Mallory Stewart, female    DOB: 09-10-1960, 60 y.o.   MRN: [id number]  This visit was conducted in person.  BP 120/76   Pulse 89   Temp (!) 97.1 F (36.2 C) (Temporal)   Ht 5' 8.5" (1.74 m)   Wt 196 lb 4 oz (89 kg)   SpO2 97%   BMI 29.41 kg/m     CC:  Chief Complaint  Patient presents with  . Annual Exam    Subjective:   HPI: Mallory Stewart is a 60 y.o. female presenting on 06/21/2020 for Annual Exam   Continued nasal congestion/runny nose chronically... x 3 months  Occ seeing thick green nasal discharge chunks.  Post nasal drip, no facial At last OV tried azelastine... helped a little.  Tired Xyzal instead of Zyrtec.  Flonase was not helpful.  She is very anxious about leaving the house given the post nasal drip causes N/V. She took a friend's Xanax and it relieved her situational anxiety and helped her not vomit. She has an upcoming conference where she has to be around people for several days... she is anxious about it.  Chronic alcoholic pancreatitis: no issues in year. Noted increased Alk phos and AST on  Recent labs. No abdominal pain.  Hypertension:   Good control on lisinopril 40 mg daily, metoprolol 50 mg daily.  BP Readings from Last 3 Encounters:  06/21/20 120/76  05/13/20 120/80  11/03/19 118/82  Using medication without problems or lightheadedness:  Chest pain with exertion: Edema: Short of breath: Average home BPs: Other issues:  Elevated Cholesterol:  LDL not at goal < 100.. not on statin.. was on statin in past  HDL very low! Lab Results  Component Value Date   CHOL 168 06/16/2020   HDL 18.10 (L) 06/16/2020   LDLCALC 82 10/16/2018   LDLDIRECT 129.0 06/16/2020   TRIG 236.0 (H) 06/16/2020   CHOLHDL 9 06/16/2020  Using medications without problems: Muscle aches:  Diet compliance: Exercise: Other complaints:  Diabetes:  Has noted more lows since cannot eat with change in taste an nasal issues.  Plan switching to [employer]  for insurance reasons.  Seeing ENDO Dr. Kelton Pillar.. next POV 2 weeks from now. On farxiga as well. Lab Results  Component Value Date   HGBA1C 5.8 06/16/2020  Using medications without difficulties: Hypoglycemic episodes: occ in AMs Hyperglycemic episodes: Feet problems: no ulcers, increased pain at night.. has been using CBD.Marland Kitchen only helps a little. Blood Sugars averaging: FBS 72-88 eye exam within last year: due     Relevant past medical, surgical, family and social history reviewed and updated as indicated. Interim medical history since our last visit reviewed. Allergies and medications reviewed and updated. Outpatient Medications Prior to Visit  Medication Sig Dispense Refill  . azelastine (ASTELIN) 0.1 % nasal spray Place 2 sprays into both nostrils 2 (two) times daily. Use in each nostril as directed 30 mL 12  . azelastine (OPTIVAR) 0.05 % ophthalmic solution Place 1 drop into both eyes 2 (two) times daily. 6 mL 12  . B-D UF III MINI PEN NEEDLES 31G X 5 MM MISC USE 4 TIMES A DAY AS DIRECTED 100 each 3  . Blood Glucose Monitoring Suppl (ONE TOUCH ULTRA 2) w/Device KIT Use to check blood sugars two times day 1 each 0  . Cyanocobalamin (B-12) 1000 MCG TBCR Take 2,000 mcg by mouth daily. 30 tablet   . dapagliflozin propanediol (FARXIGA) 10 MG TABS tablet Take 10 mg by  mouth daily before breakfast. 90 tablet 3  . fluticasone (FLONASE) 50 MCG/ACT nasal spray Place 2 sprays into both nostrils every morning.    . insulin glargine, 2 Unit Dial, (TOUJEO MAX SOLOSTAR) 300 UNIT/ML Solostar Pen Inject 34 Units into the skin daily at 2 PM. 15 mL 6  . Lancets (ONETOUCH ULTRASOFT) lancets Use to check blood sugar two times a day. 200 each 3  . levocetirizine (XYZAL) 5 MG tablet Take 5 mg by mouth at bedtime.    Marland Kitchen lisinopril (ZESTRIL) 40 MG tablet Take 1 tablet (40 mg total) by mouth daily. 90 tablet 0  . methocarbamol (ROBAXIN) 500 MG tablet Take 1 tablet (500 mg total) by mouth every 8 (eight) hours  as needed for muscle spasms. 20 tablet 0  . metoprolol tartrate (LOPRESSOR) 50 MG tablet Take 1 tablet (50 mg total) by mouth 2 (two) times daily. 180 tablet 0  . Multiple Vitamin (MULTIVITAMIN WITH MINERALS) TABS tablet Take 1 tablet by mouth daily.    Glory Rosebush ULTRA test strip CHECK BLOOD SUGAR TWO TIMES DAILY AS DIRECTED 100 strip 7  . pantoprazole (PROTONIX) 40 MG tablet TAKE 1 TABLET BY MOUTH TWICE DAILY BEFORE A MEAL 60 tablet 1  . vitamin B-12 (CYANOCOBALAMIN) 500 MCG tablet Take 1,000 mcg by mouth daily.     No facility-administered medications prior to visit.     Per HPI unless specifically indicated in ROS section below Review of Systems  Constitutional: Positive for fatigue. Negative for fever.  HENT: Negative for congestion.   Eyes: Negative for pain.  Respiratory: Negative for cough and shortness of breath.   Cardiovascular: Negative for chest pain, palpitations and leg swelling.  Gastrointestinal: Negative for abdominal pain.  Genitourinary: Negative for dysuria and vaginal bleeding.  Musculoskeletal: Negative for back pain.  Neurological: Negative for syncope, light-headedness and headaches.  Psychiatric/Behavioral: Negative for dysphoric mood.   Objective:  BP 120/76   Pulse 89   Temp (!) 97.1 F (36.2 C) (Temporal)   Ht 5' 8.5" (1.74 m)   Wt 196 lb 4 oz (89 kg)   SpO2 97%   BMI 29.41 kg/m   Wt Readings from Last 3 Encounters:  06/21/20 196 lb 4 oz (89 kg)  05/13/20 203 lb 4 oz (92.2 kg)  11/03/19 189 lb 4 oz (85.8 kg)      Physical Exam Constitutional:      General: She is not in acute distress.Vital signs are normal.     Appearance: Normal appearance. She is well-developed and well-nourished. She is not ill-appearing or toxic-appearing.  HENT:     Head: Normocephalic.     Right Ear: Hearing, tympanic membrane, ear canal and external ear normal.     Left Ear: Hearing, tympanic membrane, ear canal and external ear normal.     Nose: Nose normal.  Eyes:      General: Lids are normal. Lids are everted, no foreign bodies appreciated.     Extraocular Movements: EOM normal.     Conjunctiva/sclera: Conjunctivae normal.     Pupils: Pupils are equal, round, and reactive to light.  Neck:     Thyroid: No thyroid mass or thyromegaly.     Vascular: No carotid bruit.     Trachea: Trachea normal.  Cardiovascular:     Rate and Rhythm: Normal rate and regular rhythm.     Pulses: Intact distal pulses.     Heart sounds: Normal heart sounds, S1 normal and S2 normal. No murmur heard. No gallop.  Pulmonary:     Effort: Pulmonary effort is normal. No respiratory distress.     Breath sounds: Normal breath sounds. No wheezing, rhonchi or rales.  Abdominal:     General: Bowel sounds are normal. There is no distension or abdominal bruit.     Palpations: Abdomen is soft. There is no fluid wave, hepatosplenomegaly or mass.     Tenderness: There is no abdominal tenderness. There is no CVA tenderness, guarding or rebound.     Hernia: No hernia is present.  Musculoskeletal:     Cervical back: Normal range of motion and neck supple.  Lymphadenopathy:     Cervical: No cervical adenopathy.     Upper Body:  No axillary adenopathy present. Skin:    General: Skin is warm, dry and intact.     Findings: No rash.  Neurological:     Mental Status: She is alert.     Cranial Nerves: No cranial nerve deficit.     Sensory: No sensory deficit.     Deep Tendon Reflexes: Strength normal.  Psychiatric:        Mood and Affect: Mood is not anxious or depressed.        Speech: Speech normal.        Behavior: Behavior normal. Behavior is cooperative.        Cognition and Memory: Cognition and memory normal.        Judgment: Judgment normal.       Diabetic foot exam: Normal inspection No skin breakdown No calluses  Normal DP pulses No sensation to light touch and monofilament Nails normal  Results for orders placed or performed in visit on 06/16/20  Hemoglobin A1c   Result Value Ref Range   Hgb A1c MFr Bld 5.8 4.6 - 6.5 %  Lipid panel  Result Value Ref Range   Cholesterol 168 0 - 200 mg/dL   Triglycerides 045.4 (H) 0.0 - 149.0 mg/dL   HDL 09.81 (L) >19.14 mg/dL   VLDL 78.2 (H) 0.0 - 95.6 mg/dL   Total CHOL/HDL Ratio 9    NonHDL 149.45   Comprehensive metabolic panel  Result Value Ref Range   Sodium 133 (L) 135 - 145 mEq/L   Potassium 4.7 3.5 - 5.1 mEq/L   Chloride 99 96 - 112 mEq/L   CO2 28 19 - 32 mEq/L   Glucose, Bld 135 (H) 70 - 99 mg/dL   BUN 6 6 - 23 mg/dL   Creatinine, Ser 2.13 0.40 - 1.20 mg/dL   Total Bilirubin 1.4 (H) 0.2 - 1.2 mg/dL   Alkaline Phosphatase 215 (H) 39 - 117 U/L   AST 61 (H) 0 - 37 U/L   ALT 18 0 - 35 U/L   Total Protein 7.0 6.0 - 8.3 g/dL   Albumin 3.4 (L) 3.5 - 5.2 g/dL   GFR 08.65 >78.46 mL/min   Calcium 9.0 8.4 - 10.5 mg/dL  Vitamin N62  Result Value Ref Range   Vitamin B-12 314 211 - 911 pg/mL  LDL cholesterol, direct  Result Value Ref Range   Direct LDL 129.0 mg/dL    This visit occurred during the SARS-CoV-2 public health emergency.  Safety protocols were in place, including screening questions prior to the visit, additional usage of staff PPE, and extensive cleaning of exam room while observing appropriate contact time as indicated for disinfecting solutions.   COVID 19 screen:  No recent travel or known exposure to COVID19 The patient denies respiratory symptoms of COVID 19 at this time. The  importance of social distancing was discussed today.   Assessment and Plan   The patient's preventative maintenance and recommended screening tests for an annual wellness exam were reviewed in full today. Brought up to date unless services declined.  Counselled on the importance of diet, exercise, and its role in overall health and mortality. The patient's FH and SH was reviewed, including their home life, tobacco status, and drug and alcohol status.   Vaccines: Due for flu, TD.. hold off for now. S/P COVID  x 3, Uptodate with Shingrix and PNA Pap/DVE:  Hx of abnormal pap, 09/2015 normal repeat q5 years...  plan next year. Mammo:  DUE Colon: DUE Smoking Status: none ETOH/ drug use:  None/none  Hep C: done  HIV screen:  done  Problem List Items Addressed This Visit    Chronic alcoholic pancreatitis ([place]) (Chronic)   Diabetes mellitus with circulatory complication, HTN ([place])     Very well controlled on Toujeo 34 units daily, but frequent lows.. drop to 30 units daily and follow up as planned with Dr. Kelton Pillar. Continue farxiga for now. Increase healthy po intake.      Relevant Medications   losartan (COZAAR) 100 MG tablet   Elevated alkaline phosphatase level    New, Unclear cause... no current abd pain... ? Related to emesis and decreased po intake? Re-eval at next OV.      Hyperlipidemia associated with type 2 diabetes mellitus ([place]) (Chronic)    Was  On statin in past.. stopped given possible increase LFTs... not sure if true elevation or releated to past pancreatitis issues.  Reconsider  Starting statin or other cholesterol med in furture. Pt not currently eating well with many issues.. so will hold off new start.      Relevant Medications   losartan (COZAAR) 100 MG tablet   Hypertension associated with diabetes ([facility]) (Chronic)    Stable, chronic.  Continue current medication.   on lisinopril 40 mg daily, metoprolol 50 mg daily.      Relevant Medications   losartan (COZAAR) 100 MG tablet   Low HDL (under 40)     New, Very low.. likely due to poor nutrition and recent emesis. In past very high in 100s.  Encouraged 3 meals daily with protein  And vegetable fats. Restart exercsie as tolerated. Wt Readings from Last 3 Encounters:  06/21/20 196 lb 4 oz (89 kg)  05/13/20 203 lb 4 oz (92.2 kg)  11/03/19 189 lb 4 oz (85.8 kg)        Neuropathic pain of both legs (Chronic)    Chronic, worsening Last neuro eval... she was told " nerves are beautiful"... but no sensation in  feet and lower legs, pain worsening.  Will try low dose trial of amitriptyline.. likely will need to titrate up.  Neurontin not effective in past even at high doses. Narcotics not very effective and made her feel terrible.      Non-seasonal allergic rhinitis    Chronic, severe Very bothersome for pt.. cannot function.  No benefit from Xyzal, zyrtec, flonase .. small benefit from azelastine spray.   no clear S/S of infection.   Refer to allergist for further eval and recommendations.      Relevant Orders   Ambulatory referral to Allergy   Post-nasal drip   Relevant Orders   Ambulatory referral to Allergy    Other Visit Diagnoses    Routine general medical examination at a health care facility    -  Primary   Colon  cancer screening       Relevant Orders   Fecal occult blood, imunochemical     Meds ordered this encounter  Medications  . losartan (COZAAR) 100 MG tablet    Sig: Take 1 tablet (100 mg total) by mouth daily.    Dispense:  30 tablet    Refill:  11  . amitriptyline (ELAVIL) 25 MG tablet    Sig: Take 1 tablet (25 mg total) by mouth at bedtime.    Dispense:  30 tablet    Refill:  1  . ALPRAZolam (XANAX) 0.25 MG tablet    Sig: Take 1 tablet (0.25 mg total) by mouth daily as needed for anxiety.    Dispense:  20 tablet    Refill:  0   Orders Placed This Encounter  Procedures  . Fecal occult blood, imunochemical    Standing Status:   Future    Standing Expiration Date:   06/21/2021  . Ambulatory referral to Allergy    Referral Priority:   Routine    Referral Type:   Allergy Testing    Referral Reason:   Specialty Services Required    Requested Specialty:   Allergy    Number of Visits Requested:   1  . [facility] DIABETES FOOT EXAM    This external order was created through the Results Console.     Eliezer Lofts, MD

## 2020-06-21 NOTE — Assessment & Plan Note (Addendum)
New, Very low.. likely due to poor nutrition and recent emesis. In past very high in 100s.  Encouraged 3 meals daily with protein  And vegetable fats. Restart exercsie as tolerated. Wt Readings from Last 3 Encounters:  06/21/20 196 lb 4 oz (89 kg)  05/13/20 203 lb 4 oz (92.2 kg)  11/03/19 189 lb 4 oz (85.8 kg)

## 2020-06-21 NOTE — Assessment & Plan Note (Signed)
Stable, chronic.  Continue current medication.   on lisinopril 40 mg daily, metoprolol 50 mg daily.

## 2020-06-21 NOTE — Assessment & Plan Note (Addendum)
Chronic, severe Very bothersome for pt.. cannot function.  No benefit from Xyzal, zyrtec, flonase .. small benefit from azelastine spray.   no clear S/S of infection.   Refer to allergist for further eval and recommendations.

## 2020-06-21 NOTE — Patient Instructions (Addendum)
Decrease Toujeo/ insulin to 30 Units daily.  Follow blood sugars and discuss long term plan with endocrinologist.  Work on eating more nutritious meals, protein etc.  Stop lisinopril and change to losartan daily.. to see if we can decrease cough.  We will  Call with information on referral to allergist.  Stop any CBD smoke inhalation. Call to set up mammogram.  Pick up stool cards in lab on way out.

## 2020-06-21 NOTE — Assessment & Plan Note (Signed)
Was  On statin in past.. stopped given possible increase LFTs... not sure if true elevation or releated to past pancreatitis issues.  Reconsider  Starting statin or other cholesterol med in furture. Pt not currently eating well with many issues.. so will hold off new start.

## 2020-06-21 NOTE — Assessment & Plan Note (Addendum)
New, Unclear cause... no current abd pain... ? Related to emesis and decreased po intake? Re-eval at next OV.

## 2020-07-04 DIAGNOSIS — R053 Chronic cough: Secondary | ICD-10-CM | POA: Insufficient documentation

## 2020-07-04 NOTE — Assessment & Plan Note (Signed)
Most likely secondary to  alleriges.   Non smoker.

## 2020-07-04 NOTE — Assessment & Plan Note (Addendum)
Stop  Xyzal and  change  To Over-the-counter Zyrtec at bedtime.  Start prescription topical eye drops for allergies as needed.  Change Flonase to prescription antihistamine nose spray.  if not improving.. consider  referral to allergist for allergy testing.

## 2020-07-12 NOTE — Progress Notes (Signed)
Name: Mallory Stewart  Age/ Sex: 60 y.o., female   MRN/ DOB: [id number], 1960-10-22     PCP: [name], MD   Reason for Endocrinology Evaluation: Type 2 Diabetes Mellitus  Initial Endocrine Consultative Visit: 08/22/2018    PATIENT IDENTIFIER: Ms. Mallory Stewart is a 60 y.o. female with a past medical history of HTN, T2DM , chronic pancreatitis and gastric outlet obstruction. The patient has followed with Endocrinology clinic since 08/22/2018 for consultative assistance with management of her diabetes.  DIABETIC HISTORY:  [name] was with  T2DM in 2010.Pt had hospital admission in 2014 for pancreatitis secondary to ETOH intake, with another one in 2018 and 2020. Insulin started in 07/2018 . Her hemoglobin A1c has ranged from 5.4%in 2016, peaking at13.3%In 2020.  Prandial insulin started 08/2018 Farxiga added in 09/2018  SUBJECTIVE:   During the last visit (08/19/2019): A1c 7.1% we continued tresiba Farxiga      Today (07/13/2020): [name] is here for a follow up on diabetes management. She has not been here since 08/2019.  She checks her blood sugars 1 times daily. The patient has had hypoglycemic episodes since the last clinic visit, which typically occur several x /week - most often occuring during the day. The patient is symptomatic with these episodes.   She has constant pain and tingling of the feet .   HOME DIABETES REGIMEN:  Toujeo 30 units daily  Farxiga 10 mg daily      Statin: No ACE-I/ARB: yes    METER DOWNLOAD SUMMARY: Did not bring     DIABETIC COMPLICATIONS: Microvascular complications:   Neuropathic   Denies: CKD  Last Eye Exam: Completed 2018  Macrovascular complications:   Denies: CAD, CVA, PVD   HISTORY:  Past Medical History:  Past Medical History:  Diagnosis Date  . Alcohol-induced chronic pancreatitis ([place])   . B12 deficiency   . Concussion   . DDD (degenerative disc disease), cervical   . Diabetes ([place])   .  DKA, type 2 ([place])   . Gastric outlet obstruction 08/12/2018  . Hypertension   . Neuropathy   . Seasonal allergies    Past Surgical History:  Past Surgical History:  Procedure Laterality Date  . BIOPSY  01/19/2019   Procedure: BIOPSY;  Surgeon: Rush Landmark Telford Nab., MD;  Location: [facility];  Service: Gastroenterology;;  . CHOLECYSTECTOMY N/A 07/28/2016   Procedure: LAPAROSCOPIC CHOLECYSTECTOMY WITH INTRAOPERATIVE CHOLANGIOGRAM;  Surgeon: Mickeal Skinner, MD;  Location: [facility];  Service: General;  Laterality: N/A;  . ESOPHAGOGASTRODUODENOSCOPY (EGD) WITH PROPOFOL N/A 08/13/2018   Procedure: ESOPHAGOGASTRODUODENOSCOPY (EGD) WITH PROPOFOL;  Surgeon: Irving Copas., MD;  Location: [facility];  Service: Gastroenterology;  Laterality: N/A;  . ESOPHAGOGASTRODUODENOSCOPY (EGD) WITH PROPOFOL N/A 01/19/2019   Procedure: ESOPHAGOGASTRODUODENOSCOPY (EGD) WITH PROPOFOL;  Surgeon: Rush Landmark Telford Nab., MD;  Location: [facility];  Service: Gastroenterology;  Laterality: N/A;  . LEEP  1990's  . ORIF FOOT FRACTURE  09/2008   L 5th metatarsal  . REFRACTIVE SURGERY  2000  . TONSILLECTOMY    . UPPER ESOPHAGEAL ENDOSCOPIC ULTRASOUND (EUS) N/A 08/13/2018   Procedure: UPPER ESOPHAGEAL ENDOSCOPIC ULTRASOUND (EUS);  Surgeon: Irving Copas., MD;  Location: [facility];  Service: Gastroenterology;  Laterality: N/A;  . UPPER ESOPHAGEAL ENDOSCOPIC ULTRASOUND (EUS) N/A 01/19/2019   Procedure: UPPER ESOPHAGEAL ENDOSCOPIC ULTRASOUND (EUS);  Surgeon: Irving Copas., MD;  Location: [facility];  Service: Gastroenterology;  Laterality: N/A;    Social History:  reports that she has never smoked.  She has never used smokeless tobacco. She reports previous alcohol use of about 7.0 standard drinks of alcohol per week. She reports that she does not use drugs. Family History:  Family History  Problem Relation Age of Onset  . Other Mother        tachycardia.Marland KitchenMarland Kitchen?afib  . Stroke Father         after hernia suegery  . Prostate cancer Father   . Other Father        global transient amnesia, unclear source  . Atrial fibrillation Sister   . Healthy Brother   . Healthy Brother   . Coronary artery disease Paternal Grandmother   . Heart attack Paternal Grandmother 72  . Brain cancer Maternal Grandfather        ?  Marland Kitchen Cancer Paternal Grandfather        ?  Marland Kitchen Breast cancer Maternal Grandmother      HOME MEDICATIONS: Allergies as of 07/13/2020      Reactions   Gluten Meal Other (See Comments)   Stomach distress   Zocor [simvastatin] Other (See Comments)   Elevated lfts?      Medication List       Accurate as of July 13, 2020 12:07 PM. If you have any questions, ask your nurse or doctor.        ALPRAZolam 0.25 MG tablet Commonly known as: XANAX Take 1 tablet (0.25 mg total) by mouth daily as needed for anxiety.   amitriptyline 25 MG tablet Commonly known as: ELAVIL Take 1 tablet (25 mg total) by mouth at bedtime.   azelastine 0.05 % ophthalmic solution Commonly known as: OPTIVAR Place 1 drop into both eyes 2 (two) times daily.   azelastine 0.1 % nasal spray Commonly known as: ASTELIN Place 2 sprays into both nostrils 2 (two) times daily. Use in each nostril as directed   vitamin B-12 500 MCG tablet Commonly known as: CYANOCOBALAMIN Take 1,000 mcg by mouth daily.   B-12 1000 MCG Tbcr Take 2,000 mcg by mouth daily.   B-D UF III MINI PEN NEEDLES 31G X 5 MM Misc Generic drug: Insulin Pen Needle USE 4 TIMES A DAY AS DIRECTED   Farxiga 10 MG Tabs tablet Generic drug: dapagliflozin propanediol Take 10 mg by mouth daily before breakfast.   fluticasone 50 MCG/ACT nasal spray Commonly known as: FLONASE Place 2 sprays into both nostrils every morning.   levocetirizine 5 MG tablet Commonly known as: XYZAL Take 5 mg by mouth at bedtime.   losartan 100 MG tablet Commonly known as: COZAAR Take 1 tablet (100 mg total) by mouth daily.   methocarbamol 500 MG  tablet Commonly known as: ROBAXIN Take 1 tablet (500 mg total) by mouth every 8 (eight) hours as needed for muscle spasms.   metoprolol tartrate 50 MG tablet Commonly known as: LOPRESSOR Take 1 tablet (50 mg total) by mouth 2 (two) times daily.   multivitamin with minerals Tabs tablet Take 1 tablet by mouth daily.   ONE TOUCH ULTRA 2 w/Device Kit Use to check blood sugars two times day   OneTouch Ultra test strip Generic drug: glucose blood CHECK BLOOD SUGAR TWO TIMES DAILY AS DIRECTED   onetouch ultrasoft lancets Use to check blood sugar two times a day.   pantoprazole 40 MG tablet Commonly known as: PROTONIX TAKE 1 TABLET BY MOUTH TWICE DAILY BEFORE A MEAL   Toujeo Max SoloStar 300 UNIT/ML Solostar Pen Generic drug: insulin glargine (2 Unit Dial) Inject 34 Units into the skin  daily at 2 PM. What changed: how much to take        OBJECTIVE:   Vital Signs: BP 122/82   Pulse 90   Ht 5' 8.25" (1.734 m)   Wt 191 lb 6 oz (86.8 kg)   SpO2 98%   BMI 28.89 kg/m   Wt Readings from Last 3 Encounters:  07/13/20 191 lb 6 oz (86.8 kg)  06/21/20 196 lb 4 oz (89 kg)  05/13/20 203 lb 4 oz (92.2 kg)     Exam: General: Pt appears well and is in NAD  Neck: General: Supple without adenopathy. Thyroid: Thyroid size normal.  No goiter or nodules appreciated.  Lungs: Clear with good BS bilat with no rales, rhonchi, or wheezes  Heart: RRR with normal S1 and S2 and no gallops; no murmurs; no rub  Abdomen: Normoactive bowel sounds, soft, nontender, without masses or organomegaly palpable  Extremities: No pretibial edema.   Neuro: MS is good with appropriate affect, pt is alert and Ox3    DM foot exam:   07/13/2020  The skin of the feet is intact without sores or ulcerations. The pedal pulses are 2+ on right and 2+ on left. The sensation is absent to a screening 5.07, 10 gram monofilament bilaterally        DATA REVIEWED:  Lab Results  Component Value Date   HGBA1C 5.8  06/16/2020   HGBA1C 7.1 (A) 08/19/2019   HGBA1C 7.0 (H) 10/16/2018   Lab Results  Component Value Date   MICROALBUR 1.6 08/19/2019   LDLCALC 82 10/16/2018   CREATININE 0.94 06/16/2020   Lab Results  Component Value Date   MICRALBCREAT 6.6 08/19/2019     Lab Results  Component Value Date   CHOL 168 06/16/2020   HDL 18.10 (L) 06/16/2020   LDLCALC 82 10/16/2018   LDLDIRECT 129.0 06/16/2020   TRIG 236.0 (H) 06/16/2020   CHOLHDL 9 06/16/2020       Results for Abee, Sinia L "[name]" (MRN [id number]) as of 07/13/2020 11:57  Ref. Range 06/16/2020 08:38  Sodium Latest Ref Range: 135 - 145 mEq/L 133 (L)  Potassium Latest Ref Range: 3.5 - 5.1 mEq/L 4.7  Chloride Latest Ref Range: 96 - 112 mEq/L 99  CO2 Latest Ref Range: 19 - 32 mEq/L 28  Glucose Latest Ref Range: 70 - 99 mg/dL 135 (H)  BUN Latest Ref Range: 6 - 23 mg/dL 6  Creatinine Latest Ref Range: 0.40 - 1.20 mg/dL 0.94  Calcium Latest Ref Range: 8.4 - 10.5 mg/dL 9.0  Alkaline Phosphatase Latest Ref Range: 39 - 117 U/L 215 (H)  Albumin Latest Ref Range: 3.5 - 5.2 g/dL 3.4 (L)  AST Latest Ref Range: 0 - 37 U/L 61 (H)  ALT Latest Ref Range: 0 - 35 U/L 18  Total Protein Latest Ref Range: 6.0 - 8.3 g/dL 7.0  Total Bilirubin Latest Ref Range: 0.2 - 1.2 mg/dL 1.4 (H)  GFR Latest Ref Range: >60.00 mL/min 67.41    ASSESSMENT / PLAN / RECOMMENDATIONS:   1) Type 2 Diabetes Mellitus, With Neuropathic complications - Most recent A1c of 5.8 %. Goal A1c < 7.0 %.     - A1c is low due to recurrent hypoglycemia. She was having frequent hypoglycemia, until her basal insulin was reduce by her PCP last month. We discussed the importance of keeping up with her office visit and having periodic check on her diabetes, historically she has been making it here once a year which I explained to her  is not the right way to manage diabetes.   - She is intolerant to Metformin - She is not a candidate for GLP-1 agonists, or DPP-4 inhibitors due to hx of  pancreatitis.  - In - Office Bg was high at 226 mg/dL, pt attributes this to eating right before her appointment.  - Due to lack of more glucose data, as the pt does not bring glucose meter, no changes will be made today   MEDICATIONS: -Continue Toujeo 30 units daily -Continue Farxiga 10 mg daily with breakfast    EDUCATION / INSTRUCTIONS:  BG monitoring instructions: Patient is instructed to check her blood sugars 1 times a day.  Call [facility] if: BG persistently < 70  . I reviewed the Rule of 15 for the treatment of hypoglycemia in detail with the patient. Literature supplied.   2) Diabetic complications:   Eye: Does not have known diabetic retinopathy. Pt urged to have an eye exam soon   Neuro/ Feet: Does have known diabetic peripheral neuropathy.  Renal: Patient does not have known baseline CKD. She is on an ACEI/ARB at present.   3) Dyslipidemia:  - She is intolerant to Simvastatin- elevated LFT's   4) Peripheral Neuropathy:   - Pt pt she had EMG studies in the past through [facility] and was told her nerves are normal.  - She was on Oxycodone and gabapentin without help. Pt advised to try Capsaicin cream or lidocaine cream  - She was also advised to start B12 daily as her levels were low-normal range    F/U in 6 months   Signed electronically by: Mack Guise, MD  [facility]  [address] [place], [place] [address] Phone: [phone] FAX: [phone]   CC: [name], MD [place] [address] Phone: [phone]  Fax: [phone]  Return to Endocrinology clinic as below: Future Appointments  Date Time Provider [place]  07/19/2020  8:20 AM [name], MD [facility]  08/11/2020  9:30 AM Kennith Gain, MD [facility] None

## 2020-07-13 ENCOUNTER — Ambulatory Visit: Payer: 59 | Admitting: Internal Medicine

## 2020-07-13 ENCOUNTER — Encounter: Payer: Self-pay | Admitting: Internal Medicine

## 2020-07-13 ENCOUNTER — Other Ambulatory Visit: Payer: Self-pay

## 2020-07-13 VITALS — BP 122/82 | HR 90 | Ht 68.25 in | Wt 191.4 lb

## 2020-07-13 DIAGNOSIS — E1142 Type 2 diabetes mellitus with diabetic polyneuropathy: Secondary | ICD-10-CM | POA: Diagnosis not present

## 2020-07-13 DIAGNOSIS — G609 Hereditary and idiopathic neuropathy, unspecified: Secondary | ICD-10-CM | POA: Insufficient documentation

## 2020-07-13 DIAGNOSIS — Z794 Long term (current) use of insulin: Secondary | ICD-10-CM

## 2020-07-13 LAB — POCT GLUCOSE (DEVICE FOR HOME USE): POC Glucose: 226 mg/dl — AB (ref 70–99)

## 2020-07-13 NOTE — Patient Instructions (Signed)
-   Continue  Farxiga 10 mg 1 tablet daily  - Continue Toujeo 30 units daily      Please see an eye doctor    HOW TO TREAT LOW BLOOD SUGARS (Blood sugar LESS THAN 70 MG/DL)  Please follow the RULE OF 15 for the treatment of hypoglycemia treatment (when your (blood sugars are less than 70 mg/dL)    STEP 1: Take 15 grams of carbohydrates when your blood sugar is low, which includes:   3-4 GLUCOSE TABS  OR  3-4 OZ OF JUICE OR REGULAR SODA OR  ONE TUBE OF GLUCOSE GEL     STEP 2: RECHECK blood sugar in 15 MINUTES STEP 3: If your blood sugar is still low at the 15 minute recheck --> then, go back to STEP 1 and treat AGAIN with another 15 grams of carbohydrates.

## 2020-07-19 ENCOUNTER — Encounter: Payer: Self-pay | Admitting: Family Medicine

## 2020-07-19 ENCOUNTER — Other Ambulatory Visit: Payer: Self-pay

## 2020-07-19 ENCOUNTER — Other Ambulatory Visit (INDEPENDENT_AMBULATORY_CARE_PROVIDER_SITE_OTHER): Payer: 59

## 2020-07-19 ENCOUNTER — Ambulatory Visit (INDEPENDENT_AMBULATORY_CARE_PROVIDER_SITE_OTHER): Payer: 59 | Admitting: Family Medicine

## 2020-07-19 VITALS — BP 120/80 | HR 97 | Temp 97.0°F | Ht 68.5 in | Wt 191.5 lb

## 2020-07-19 DIAGNOSIS — G609 Hereditary and idiopathic neuropathy, unspecified: Secondary | ICD-10-CM

## 2020-07-19 DIAGNOSIS — R748 Abnormal levels of other serum enzymes: Secondary | ICD-10-CM | POA: Diagnosis not present

## 2020-07-19 LAB — HEPATIC FUNCTION PANEL
ALT: 20 U/L (ref 0–35)
AST: 58 U/L — ABNORMAL HIGH (ref 0–37)
Albumin: 3.4 g/dL — ABNORMAL LOW (ref 3.5–5.2)
Alkaline Phosphatase: 271 U/L — ABNORMAL HIGH (ref 39–117)
Bilirubin, Direct: 0.6 mg/dL — ABNORMAL HIGH (ref 0.0–0.3)
Total Bilirubin: 1.6 mg/dL — ABNORMAL HIGH (ref 0.2–1.2)
Total Protein: 7.1 g/dL (ref 6.0–8.3)

## 2020-07-19 LAB — LIPASE: Lipase: 46 U/L (ref 11.0–59.0)

## 2020-07-19 MED ORDER — AMITRIPTYLINE HCL 50 MG PO TABS
50.0000 mg | ORAL_TABLET | Freq: Every day | ORAL | 3 refills | Status: DC
Start: 2020-07-19 — End: 2020-11-08

## 2020-07-19 NOTE — Assessment & Plan Note (Signed)
Keep appt on April 7 with allergist.

## 2020-07-19 NOTE — Patient Instructions (Addendum)
Increase to 50 mg at bedtime of amitryptiline.  Stop all nasal spray and keep appt with allergist.   Please stop at the lab to have labs drawn.

## 2020-07-19 NOTE — Assessment & Plan Note (Signed)
Some improvement in pain and sleep.Marland Kitchen on low dose. Increase to 50 mg daily. No SE except some dry mouth.

## 2020-07-19 NOTE — Assessment & Plan Note (Signed)
Unclear cause... recheck  Today.

## 2020-07-19 NOTE — Progress Notes (Signed)
Patient ID: Mallory Stewart, female    DOB: 1960-09-25, 60 y.o.   MRN: [id number]  This visit was conducted in person.  BP 120/80   Pulse 97   Temp (!) 97 F (36.1 C) (Temporal)   Ht 5' 8.5" (1.74 m)   Wt 191 lb 8 oz (86.9 kg)   SpO2 95%   BMI 28.69 kg/m    CC:  Chief Complaint  Patient presents with  . Follow-up    Neuropathic Pain, Anxiety, HTN & Nasal symptoms   Subjective:   HPI: Mallory Stewart is a 60 y.o. female presenting on 07/19/2020 for Follow-up (Neuropathic Pain, Anxiety, HTN & Nasal symptoms)  At last OV 2/15.Marland Kitchen started on trial of amitriptyline at bedtime for neuropathic leg pan as well as mood/anxiety.  No SE... possible slight benefit in decreased leg pain.  She was referred to an allergist for further eval/treatment of chronic severe rhinorrhea.  Has appt on April 7th.  She has nasal irritation  And occ nose bleeds... using azelastine.  She has post nasal drip that is causing.. cough and post tussive emesis/gag reflux. No nausea, no abd pain.   HTN well controlled in office  BP Readings from Last 3 Encounters:  07/19/20 120/80  07/13/20 122/82  06/21/20 120/76   Alk phos was elevated at last check for no clear reason... plan re-eval today.      Relevant past medical, surgical, family and social history reviewed and updated as indicated. Interim medical history since our last visit reviewed. Allergies and medications reviewed and updated. Outpatient Medications Prior to Visit  Medication Sig Dispense Refill  . ALPRAZolam (XANAX) 0.25 MG tablet Take 1 tablet (0.25 mg total) by mouth daily as needed for anxiety. 20 tablet 0  . amitriptyline (ELAVIL) 25 MG tablet Take 1 tablet (25 mg total) by mouth at bedtime. 30 tablet 1  . azelastine (ASTELIN) 0.1 % nasal spray Place 2 sprays into both nostrils 2 (two) times daily. Use in each nostril as directed 30 mL 12  . azelastine (OPTIVAR) 0.05 % ophthalmic solution Place 1 drop into both eyes 2 (two)  times daily. 6 mL 12  . B-D UF III MINI PEN NEEDLES 31G X 5 MM MISC USE 4 TIMES A DAY AS DIRECTED 100 each 3  . Blood Glucose Monitoring Suppl (ONE TOUCH ULTRA 2) w/Device KIT Use to check blood sugars two times day 1 each 0  . Cyanocobalamin (B-12) 1000 MCG TBCR Take 2,000 mcg by mouth daily. 30 tablet   . dapagliflozin propanediol (FARXIGA) 10 MG TABS tablet Take 10 mg by mouth daily before breakfast. 90 tablet 3  . fluticasone (FLONASE) 50 MCG/ACT nasal spray Place 2 sprays into both nostrils every morning.    . insulin glargine, 2 Unit Dial, (TOUJEO MAX SOLOSTAR) 300 UNIT/ML Solostar Pen Inject 34 Units into the skin daily at 2 PM. (Patient taking differently: Inject 30 Units into the skin daily at 2 PM.) 15 mL 6  . Lancets (ONETOUCH ULTRASOFT) lancets Use to check blood sugar two times a day. 200 each 3  . levocetirizine (XYZAL) 5 MG tablet Take 5 mg by mouth at bedtime.    Marland Kitchen losartan (COZAAR) 100 MG tablet Take 1 tablet (100 mg total) by mouth daily. 30 tablet 11  . methocarbamol (ROBAXIN) 500 MG tablet Take 1 tablet (500 mg total) by mouth every 8 (eight) hours as needed for muscle spasms. 20 tablet 0  . metoprolol tartrate (LOPRESSOR) 50 MG tablet  Take 1 tablet (50 mg total) by mouth 2 (two) times daily. 180 tablet 0  . Multiple Vitamin (MULTIVITAMIN WITH MINERALS) TABS tablet Take 1 tablet by mouth daily.    Glory Rosebush ULTRA test strip CHECK BLOOD SUGAR TWO TIMES DAILY AS DIRECTED 100 strip 7  . pantoprazole (PROTONIX) 40 MG tablet TAKE 1 TABLET BY MOUTH TWICE DAILY BEFORE A MEAL 60 tablet 1  . vitamin B-12 (CYANOCOBALAMIN) 500 MCG tablet Take 1,000 mcg by mouth daily.     No facility-administered medications prior to visit.     Per HPI unless specifically indicated in ROS section below Review of Systems  Constitutional: Negative for fatigue and fever.  HENT: Negative for ear pain.   Eyes: Negative for pain.  Respiratory: Negative for chest tightness and shortness of breath.    Cardiovascular: Negative for chest pain, palpitations and leg swelling.  Gastrointestinal: Negative for abdominal pain.  Genitourinary: Negative for dysuria.   Objective:  BP 120/80   Pulse 97   Temp (!) 97 F (36.1 C) (Temporal)   Ht 5' 8.5" (1.74 m)   Wt 191 lb 8 oz (86.9 kg)   SpO2 95%   BMI 28.69 kg/m   Wt Readings from Last 3 Encounters:  07/19/20 191 lb 8 oz (86.9 kg)  07/13/20 191 lb 6 oz (86.8 kg)  06/21/20 196 lb 4 oz (89 kg)      Physical Exam Constitutional:      General: She is not in acute distress.    Appearance: Normal appearance. She is well-developed. She is not ill-appearing or toxic-appearing.  HENT:     Head: Normocephalic.     Right Ear: Hearing, tympanic membrane, ear canal and external ear normal. Tympanic membrane is not erythematous, retracted or bulging.     Left Ear: Hearing, tympanic membrane, ear canal and external ear normal. Tympanic membrane is not erythematous, retracted or bulging.     Nose: No mucosal edema or rhinorrhea.     Right Sinus: No maxillary sinus tenderness or frontal sinus tenderness.     Left Sinus: No maxillary sinus tenderness or frontal sinus tenderness.     Mouth/Throat:     Pharynx: Uvula midline.  Eyes:     General: Lids are normal. Lids are everted, no foreign bodies appreciated.     Conjunctiva/sclera: Conjunctivae normal.     Pupils: Pupils are equal, round, and reactive to light.  Neck:     Thyroid: No thyroid mass or thyromegaly.     Vascular: No carotid bruit.     Trachea: Trachea normal.  Cardiovascular:     Rate and Rhythm: Normal rate and regular rhythm.     Pulses: Normal pulses.     Heart sounds: Normal heart sounds, S1 normal and S2 normal. No murmur heard. No friction rub. No gallop.   Pulmonary:     Effort: Pulmonary effort is normal. No tachypnea or respiratory distress.     Breath sounds: Normal breath sounds. No decreased breath sounds, wheezing, rhonchi or rales.  Abdominal:     General: Bowel  sounds are normal.     Palpations: Abdomen is soft.     Tenderness: There is no abdominal tenderness.  Musculoskeletal:     Cervical back: Normal range of motion and neck supple.  Skin:    General: Skin is warm and dry.     Findings: No rash.  Neurological:     Mental Status: She is alert.  Psychiatric:  Mood and Affect: Mood is not anxious or depressed.        Speech: Speech normal.        Behavior: Behavior normal. Behavior is cooperative.        Thought Content: Thought content normal.        Judgment: Judgment normal.       Results for orders placed or performed in visit on 07/13/20  POCT Glucose (Device for Home Use)  Result Value Ref Range   Glucose Fasting, POC     POC Glucose 226 (A) 70 - 99 mg/dl    This visit occurred during the SARS-CoV-2 public health emergency.  Safety protocols were in place, including screening questions prior to the visit, additional usage of staff PPE, and extensive cleaning of exam room while observing appropriate contact time as indicated for disinfecting solutions.   COVID 19 screen:  No recent travel or known exposure to COVID19 The patient denies respiratory symptoms of COVID 19 at this time. The importance of social distancing was discussed today.   Assessment and Plan Problem List Items Addressed This Visit    Elevated alkaline phosphatase level - Primary    Unclear cause... recheck  Today.      Relevant Orders   Hepatic Function Panel   Idiopathic peripheral neuropathy    Some improvement in pain and sleep.Marland Kitchen on low dose. Increase to 50 mg daily. No SE except some dry mouth.      Relevant Medications   amitriptyline (ELAVIL) 50 MG tablet      Meds ordered this encounter  Medications  . amitriptyline (ELAVIL) 50 MG tablet    Sig: Take 1 tablet (50 mg total) by mouth at bedtime.    Dispense:  30 tablet    Refill:  3   Orders Placed This Encounter  Procedures  . Hepatic Function Panel       Eliezer Lofts, MD

## 2020-08-05 ENCOUNTER — Telehealth: Payer: Self-pay

## 2020-08-05 ENCOUNTER — Encounter (HOSPITAL_COMMUNITY): Payer: Self-pay

## 2020-08-05 ENCOUNTER — Emergency Department (HOSPITAL_COMMUNITY): Payer: 59

## 2020-08-05 ENCOUNTER — Inpatient Hospital Stay (HOSPITAL_COMMUNITY)
Admission: EM | Admit: 2020-08-05 | Discharge: 2020-08-08 | DRG: 433 | Disposition: A | Discharge status: Home / Self Care | Payer: 59 | Attending: Internal Medicine | Admitting: Internal Medicine

## 2020-08-05 ENCOUNTER — Other Ambulatory Visit: Payer: Self-pay

## 2020-08-05 DIAGNOSIS — K219 Gastro-esophageal reflux disease without esophagitis: Secondary | ICD-10-CM | POA: Diagnosis present

## 2020-08-05 DIAGNOSIS — E440 Moderate protein-calorie malnutrition: Secondary | ICD-10-CM | POA: Diagnosis present

## 2020-08-05 DIAGNOSIS — Z6825 Body mass index (BMI) 25.0-25.9, adult: Secondary | ICD-10-CM

## 2020-08-05 DIAGNOSIS — E871 Hypo-osmolality and hyponatremia: Secondary | ICD-10-CM | POA: Diagnosis not present

## 2020-08-05 DIAGNOSIS — Z9049 Acquired absence of other specified parts of digestive tract: Secondary | ICD-10-CM

## 2020-08-05 DIAGNOSIS — R296 Repeated falls: Secondary | ICD-10-CM | POA: Diagnosis present

## 2020-08-05 DIAGNOSIS — R42 Dizziness and giddiness: Secondary | ICD-10-CM | POA: Diagnosis present

## 2020-08-05 DIAGNOSIS — Z7984 Long term (current) use of oral hypoglycemic drugs: Secondary | ICD-10-CM

## 2020-08-05 DIAGNOSIS — E1169 Type 2 diabetes mellitus with other specified complication: Secondary | ICD-10-CM | POA: Diagnosis present

## 2020-08-05 DIAGNOSIS — E1140 Type 2 diabetes mellitus with diabetic neuropathy, unspecified: Secondary | ICD-10-CM | POA: Diagnosis present

## 2020-08-05 DIAGNOSIS — W0110XA Fall on same level from slipping, tripping and stumbling with subsequent striking against unspecified object, initial encounter: Secondary | ICD-10-CM | POA: Diagnosis present

## 2020-08-05 DIAGNOSIS — Z20822 Contact with and (suspected) exposure to covid-19: Secondary | ICD-10-CM | POA: Diagnosis present

## 2020-08-05 DIAGNOSIS — Z87820 Personal history of traumatic brain injury: Secondary | ICD-10-CM

## 2020-08-05 DIAGNOSIS — Z888 Allergy status to other drugs, medicaments and biological substances status: Secondary | ICD-10-CM

## 2020-08-05 DIAGNOSIS — R7401 Elevation of levels of liver transaminase levels: Secondary | ICD-10-CM

## 2020-08-05 DIAGNOSIS — Z794 Long term (current) use of insulin: Secondary | ICD-10-CM

## 2020-08-05 DIAGNOSIS — D638 Anemia in other chronic diseases classified elsewhere: Secondary | ICD-10-CM | POA: Diagnosis present

## 2020-08-05 DIAGNOSIS — G8929 Other chronic pain: Secondary | ICD-10-CM | POA: Diagnosis present

## 2020-08-05 DIAGNOSIS — Y92009 Unspecified place in unspecified non-institutional (private) residence as the place of occurrence of the external cause: Secondary | ICD-10-CM

## 2020-08-05 DIAGNOSIS — K7460 Unspecified cirrhosis of liver: Secondary | ICD-10-CM | POA: Diagnosis not present

## 2020-08-05 DIAGNOSIS — Z91018 Allergy to other foods: Secondary | ICD-10-CM

## 2020-08-05 DIAGNOSIS — R17 Unspecified jaundice: Secondary | ICD-10-CM

## 2020-08-05 DIAGNOSIS — W19XXXA Unspecified fall, initial encounter: Secondary | ICD-10-CM

## 2020-08-05 DIAGNOSIS — J302 Other seasonal allergic rhinitis: Secondary | ICD-10-CM | POA: Diagnosis present

## 2020-08-05 DIAGNOSIS — R627 Adult failure to thrive: Secondary | ICD-10-CM | POA: Diagnosis present

## 2020-08-05 DIAGNOSIS — E8809 Other disorders of plasma-protein metabolism, not elsewhere classified: Secondary | ICD-10-CM

## 2020-08-05 DIAGNOSIS — K760 Fatty (change of) liver, not elsewhere classified: Secondary | ICD-10-CM | POA: Diagnosis present

## 2020-08-05 DIAGNOSIS — R188 Other ascites: Secondary | ICD-10-CM | POA: Diagnosis present

## 2020-08-05 DIAGNOSIS — K860 Alcohol-induced chronic pancreatitis: Secondary | ICD-10-CM | POA: Diagnosis present

## 2020-08-05 DIAGNOSIS — I10 Essential (primary) hypertension: Secondary | ICD-10-CM | POA: Diagnosis present

## 2020-08-05 DIAGNOSIS — R140 Abdominal distension (gaseous): Secondary | ICD-10-CM | POA: Diagnosis present

## 2020-08-05 DIAGNOSIS — Z8249 Family history of ischemic heart disease and other diseases of the circulatory system: Secondary | ICD-10-CM

## 2020-08-05 DIAGNOSIS — Z79899 Other long term (current) drug therapy: Secondary | ICD-10-CM

## 2020-08-05 DIAGNOSIS — R531 Weakness: Secondary | ICD-10-CM

## 2020-08-05 DIAGNOSIS — E785 Hyperlipidemia, unspecified: Secondary | ICD-10-CM | POA: Diagnosis present

## 2020-08-05 DIAGNOSIS — D696 Thrombocytopenia, unspecified: Secondary | ICD-10-CM | POA: Diagnosis present

## 2020-08-05 LAB — HEPATIC FUNCTION PANEL
ALT: 29 U/L (ref 0–44)
AST: 79 U/L — ABNORMAL HIGH (ref 15–41)
Albumin: 2.9 g/dL — ABNORMAL LOW (ref 3.5–5.0)
Alkaline Phosphatase: 291 U/L — ABNORMAL HIGH (ref 38–126)
Bilirubin, Direct: 0.9 mg/dL — ABNORMAL HIGH (ref 0.0–0.2)
Indirect Bilirubin: 1 mg/dL — ABNORMAL HIGH (ref 0.3–0.9)
Total Bilirubin: 1.9 mg/dL — ABNORMAL HIGH (ref 0.3–1.2)
Total Protein: 6.6 g/dL (ref 6.5–8.1)

## 2020-08-05 LAB — BASIC METABOLIC PANEL
Anion gap: 10 (ref 5–15)
BUN: 9 mg/dL (ref 6–20)
CO2: 20 mmol/L — ABNORMAL LOW (ref 22–32)
Calcium: 8.1 mg/dL — ABNORMAL LOW (ref 8.9–10.3)
Chloride: 96 mmol/L — ABNORMAL LOW (ref 98–111)
Creatinine, Ser: 1.01 mg/dL — ABNORMAL HIGH (ref 0.44–1.00)
GFR, Estimated: >60 mL/min (ref >60)
Glucose, Bld: 120 mg/dL — ABNORMAL HIGH (ref 70–99)
Potassium: 4.4 mmol/L (ref 3.5–5.1)
Sodium: 126 mmol/L — ABNORMAL LOW (ref 135–145)

## 2020-08-05 LAB — CBC
HCT: 31.2 % — ABNORMAL LOW (ref 36.0–46.0)
Hemoglobin: 11 g/dL — ABNORMAL LOW (ref 12.0–15.0)
MCH: 33.2 pg (ref 26.0–34.0)
MCHC: 35.3 g/dL (ref 30.0–36.0)
MCV: 94.3 fL (ref 80.0–100.0)
Platelets: 176 10*3/uL (ref 150–400)
RBC: 3.31 MIL/uL — ABNORMAL LOW (ref 3.87–5.11)
RDW: 14.6 % (ref 11.5–15.5)
WBC: 10 10*3/uL (ref 4.0–10.5)
nRBC: 0 % (ref 0.0–0.2)

## 2020-08-05 LAB — URINALYSIS, ROUTINE W REFLEX MICROSCOPIC
Bilirubin Urine: NEGATIVE
Glucose, UA: NEGATIVE mg/dL
Ketones, ur: 5 mg/dL — AB
Nitrite: NEGATIVE
Protein, ur: NEGATIVE mg/dL
Specific Gravity, Urine: 1.012 (ref 1.005–1.030)
pH: 5 (ref 5.0–8.0)

## 2020-08-05 LAB — CBG MONITORING, ED: Glucose-Capillary: 131 mg/dL — ABNORMAL HIGH (ref 70–99)

## 2020-08-05 LAB — MAGNESIUM: Magnesium: 1.1 mg/dL — ABNORMAL LOW (ref 1.7–2.4)

## 2020-08-05 MED ORDER — SODIUM CHLORIDE 0.9 % IV BOLUS
500.0000 mL | Freq: Once | INTRAVENOUS | Status: AC
  Administered 2020-08-05: 500 mL via INTRAVENOUS

## 2020-08-05 MED ORDER — SODIUM CHLORIDE 0.9 % IV SOLN: Freq: Once | INTRAVENOUS | Status: AC

## 2020-08-05 MED ORDER — ENOXAPARIN SODIUM 40 MG/0.4ML ~~LOC~~ SOLN
40.0000 mg | SUBCUTANEOUS | Status: DC
  Administered 2020-08-06 – 2020-08-08 (×3): 40 mg via SUBCUTANEOUS
  Filled 2020-08-05 (×3): qty 0.4

## 2020-08-05 MED ORDER — MAGNESIUM SULFATE 2 GM/50ML IV SOLN
2.0000 g | Freq: Once | INTRAVENOUS | Status: AC
  Administered 2020-08-05: 2 g via INTRAVENOUS
  Filled 2020-08-05: qty 50

## 2020-08-05 NOTE — ED Provider Notes (Signed)
MSE was initiated and I personally evaluated the patient and placed orders (if any) at  12:13 PM on August 05, 2020.  The patient appears stable so that the remainder of the MSE may be completed by another provider.  Patient presents today for multiple falls over the past 15 days, most recent was last night.  She attributes this to fluid in her ears   She has hit her head on 3 out of the 4 falls.   No new medications.   Can hear fluid moving in her ears, mostly the right ear.  No syncope.    Patient and I discussed that she needs to stay for full evaluation in the back, that failure to do so may put her at risk of life threatening condition and she states her   Note: Portions of this report may have been transcribed using voice recognition software. Every effort was made to ensure accuracy; however, inadvertent computerized transcription errors may be present     Norman Clay 08/05/20 1220    Blane Ohara, MD 08/06/20 1020

## 2020-08-05 NOTE — Telephone Encounter (Signed)
Agree with ER eval given head injury with worsening neuro change.

## 2020-08-05 NOTE — ED Notes (Signed)
Pt able to ambulate with standby assist. Pt is off balanced and stumbling occasionally which required intervention to steady. Pt states she feels dizzy and as if her legs do not want to move right.

## 2020-08-05 NOTE — ED Provider Notes (Signed)
[facility] EMERGENCY DEPARTMENT Provider Note   CSN: [id number] Arrival date & time: 08/05/20  1155     History Chief Complaint  Patient presents with  . Fall  . Dizziness    Mallory Stewart is a 60 y.o. female.  HPI  2 weeks of dizziness Called PCP because she has fallen 4 times, most recently last night PCP advised to come to ED to be evaluated for concussion Three of the falls including last night she has hit her head Denies LOC Has felt like she has been "slow"  States that dizziness is worsening, PCP has been treating for "fluid in ear" and changing her allergy medications Has a history of prior fall with post-concussive disorder and has had chronic balance problems since then that have exacerbated since she has had recent falls Denies any pain in extremities Has been having headaches off and on Otherwise feeling well Denies alcohol use for years, when did drink, only drank 1 glass of wine a night at most CBGs have been well-controlled Has not been drinking fluids very much lately, states that a new medication she was put on has been making her mouth very dry Feels that she has fullness in her right ear Denies any changes in hearing She has lack of sensation in her bilateral lower extremities from knees to feet and has had prior work-up with neurology, she states that cause is unclear She has not had any other changes in sensation or weakness  Past Medical History:  Diagnosis Date  . Alcohol-induced chronic pancreatitis ([place])   . B12 deficiency   . Concussion   . DDD (degenerative disc disease), cervical   . Diabetes ([place])   . DKA, type 2 ([place])   . Gastric outlet obstruction 08/12/2018  . Hypertension   . Neuropathy   . Seasonal allergies     Patient Active Problem List   Diagnosis Date Noted  . Idiopathic peripheral neuropathy 07/13/2020  . Type 2 diabetes mellitus with diabetic polyneuropathy, with long-term current use of insulin ([place])  07/13/2020  . Persistent cough for 3 weeks or longer 07/04/2020  . Low HDL (under 40) 06/21/2020  . Post-nasal drip 06/21/2020  . Allergic rhinitis 06/21/2020  . Elevated alkaline phosphatase level 06/21/2020  . Duodenitis - edema ? andgioedema 10/30/2018  . Chronic alcoholic pancreatitis ([place]) 09/02/2018  . Diabetes mellitus with circulatory complication, HTN ([place]) 40/98/1191  . Chronic pain 05/17/2017  . Osteoarthritis of spine with radiculopathy, cervical region 09/17/2016  . B12 deficiency 10/21/2014  . Neuropathic pain of both legs 02/11/2014  . Pancreatic pseudocyst/cyst 05/06/2013  . Hepatic steatosis 04/27/2013  . Hyperlipidemia associated with type 2 diabetes mellitus ([place]) 11/03/2008  . Hypertension associated with diabetes ([place]) 09/29/2008  . PAP SMEAR, ABNORMAL 09/29/2008    Past Surgical History:  Procedure Laterality Date  . BIOPSY  01/19/2019   Procedure: BIOPSY;  Surgeon: Rush Landmark Telford Nab., MD;  Location: [facility];  Service: Gastroenterology;;  . CHOLECYSTECTOMY N/A 07/28/2016   Procedure: LAPAROSCOPIC CHOLECYSTECTOMY WITH INTRAOPERATIVE CHOLANGIOGRAM;  Surgeon: Mickeal Skinner, MD;  Location: [facility];  Service: General;  Laterality: N/A;  . ESOPHAGOGASTRODUODENOSCOPY (EGD) WITH PROPOFOL N/A 08/13/2018   Procedure: ESOPHAGOGASTRODUODENOSCOPY (EGD) WITH PROPOFOL;  Surgeon: Irving Copas., MD;  Location: [facility];  Service: Gastroenterology;  Laterality: N/A;  . ESOPHAGOGASTRODUODENOSCOPY (EGD) WITH PROPOFOL N/A 01/19/2019   Procedure: ESOPHAGOGASTRODUODENOSCOPY (EGD) WITH PROPOFOL;  Surgeon: Rush Landmark Telford Nab., MD;  Location: [facility];  Service: Gastroenterology;  Laterality: N/A;  .  LEEP  1990's  . ORIF FOOT FRACTURE  09/2008   L 5th metatarsal  . REFRACTIVE SURGERY  2000  . TONSILLECTOMY    . UPPER ESOPHAGEAL ENDOSCOPIC ULTRASOUND (EUS) N/A 08/13/2018   Procedure: UPPER ESOPHAGEAL ENDOSCOPIC ULTRASOUND (EUS);  Surgeon: Irving Copas., MD;  Location: [facility];  Service: Gastroenterology;  Laterality: N/A;  . UPPER ESOPHAGEAL ENDOSCOPIC ULTRASOUND (EUS) N/A 01/19/2019   Procedure: UPPER ESOPHAGEAL ENDOSCOPIC ULTRASOUND (EUS);  Surgeon: Irving Copas., MD;  Location: [facility];  Service: Gastroenterology;  Laterality: N/A;     OB History   No obstetric history on file.     Family History  Problem Relation Age of Onset  . Other Mother        tachycardia.Marland KitchenMarland Kitchen?afib  . Stroke Father        after hernia suegery  . Prostate cancer Father   . Other Father        global transient amnesia, unclear source  . Atrial fibrillation Sister   . Healthy Brother   . Healthy Brother   . Coronary artery disease Paternal Grandmother   . Heart attack Paternal Grandmother 42  . Brain cancer Maternal Grandfather        ?  Marland Kitchen Cancer Paternal Grandfather        ?  Marland Kitchen Breast cancer Maternal Grandmother     Social History   Tobacco Use  . Smoking status: Never Smoker  . Smokeless tobacco: Never Used  Vaping Use  . Vaping Use: Never used  Substance Use Topics  . Alcohol use: Not Currently    Alcohol/week: 7.0 standard drinks    Types: 7 Glasses of wine per week    Comment: wine  . Drug use: No    Home Medications Prior to Admission medications   Medication Sig Start Date End Date Taking? Authorizing Provider  ALPRAZolam (XANAX) 0.25 MG tablet Take 1 tablet (0.25 mg total) by mouth daily as needed for anxiety. 06/21/20   Bedsole, Amy E, MD  amitriptyline (ELAVIL) 50 MG tablet Take 1 tablet (50 mg total) by mouth at bedtime. 07/19/20   Bedsole, Amy E, MD  azelastine (ASTELIN) 0.1 % nasal spray Place 2 sprays into both nostrils 2 (two) times daily. Use in each nostril as directed 05/13/20   Bedsole, Amy E, MD  azelastine (OPTIVAR) 0.05 % ophthalmic solution Place 1 drop into both eyes 2 (two) times daily. 05/13/20   Bedsole, Amy E, MD  B-D UF III MINI PEN NEEDLES 31G X 5 MM MISC USE 4 TIMES A DAY AS DIRECTED  11/30/19   Shamleffer, Melanie Crazier, MD  Blood Glucose Monitoring Suppl (ONE TOUCH ULTRA 2) w/Device KIT Use to check blood sugars two times day 08/01/18   Bedsole, Amy E, MD  Cyanocobalamin (B-12) 1000 MCG TBCR Take 2,000 mcg by mouth daily. 11/05/18   Gatha Mayer, MD  dapagliflozin propanediol (FARXIGA) 10 MG TABS tablet Take 10 mg by mouth daily before breakfast. 08/20/19   Shamleffer, Melanie Crazier, MD  fluticasone (FLONASE) 50 MCG/ACT nasal spray Place 2 sprays into both nostrils every morning.    [provider]  insulin glargine, 2 Unit Dial, (TOUJEO MAX SOLOSTAR) 300 UNIT/ML Solostar Pen Inject 34 Units into the skin daily at 2 PM. Patient taking differently: Inject 30 Units into the skin daily at 2 PM. 06/08/20   Shamleffer, Melanie Crazier, MD  Lancets [facility] ULTRASOFT) lancets Use to check blood sugar two times a day. 08/01/18   Bedsole, Amy E,  MD  levocetirizine (XYZAL) 5 MG tablet Take 5 mg by mouth at bedtime.    [provider]  losartan (COZAAR) 100 MG tablet Take 1 tablet (100 mg total) by mouth daily. 06/21/20   Bedsole, Amy E, MD  methocarbamol (ROBAXIN) 500 MG tablet Take 1 tablet (500 mg total) by mouth every 8 (eight) hours as needed for muscle spasms. 11/03/19   Bedsole, Amy E, MD  metoprolol tartrate (LOPRESSOR) 50 MG tablet Take 1 tablet (50 mg total) by mouth 2 (two) times daily. 06/02/20   [name], MD  Multiple Vitamin (MULTIVITAMIN WITH MINERALS) TABS tablet Take 1 tablet by mouth daily. 07/25/18   Mikhail, Velta Addison, DO  ONETOUCH ULTRA test strip CHECK BLOOD SUGAR TWO TIMES DAILY AS DIRECTED 03/23/20   Diona Browner, Amy E, MD  pantoprazole (PROTONIX) 40 MG tablet TAKE 1 TABLET BY MOUTH TWICE DAILY BEFORE A MEAL 05/16/20   Gatha Mayer, MD  vitamin B-12 (CYANOCOBALAMIN) 500 MCG tablet Take 1,000 mcg by mouth daily.    [provider]    Allergies    Gluten meal and Zocor [simvastatin]  Review of Systems   Review of Systems   Constitutional: Negative for appetite change, chills, fatigue and fever.  HENT: Negative for congestion.        Right ear fullness  Eyes: Negative for visual disturbance.  Respiratory: Negative for cough and shortness of breath.   Cardiovascular: Positive for leg swelling. Negative for chest pain.  Gastrointestinal: Negative for abdominal pain, blood in stool, constipation, diarrhea, nausea and vomiting.  Genitourinary: Negative for difficulty urinating and dysuria.  Musculoskeletal: Positive for gait problem. Negative for back pain.  Skin: Negative for color change.  Neurological: Positive for dizziness, numbness (chronic BLE from knees to feet) and headaches. Negative for syncope and weakness.    Physical Exam Updated Vital Signs BP (!) 123/92 (BP Location: Right Arm)   Pulse 90   Temp 99.2 F (37.3 C)   Resp (!) 22   SpO2 100%   Physical Exam Constitutional:      General: She is not in acute distress. HENT:     Head: Normocephalic and atraumatic.     Mouth/Throat:     Mouth: Mucous membranes are moist.  Eyes:     Extraocular Movements: Extraocular movements intact.     Pupils: Pupils are equal, round, and reactive to light.     Comments: Dizziness with EOM testing  Cardiovascular:     Rate and Rhythm: Normal rate and regular rhythm.     Heart sounds: No murmur heard. No friction rub. No gallop.   Pulmonary:     Effort: Pulmonary effort is normal.     Breath sounds: Normal breath sounds. No wheezing, rhonchi or rales.  Abdominal:     General: There is distension.     Palpations: Abdomen is soft.     Tenderness: There is no abdominal tenderness.  Musculoskeletal:        General: Normal range of motion.     Cervical back: Normal range of motion.     Right lower leg: Edema (1-2+ at ankles) present.     Left lower leg: Edema (1-2+ at ankles) present.  Skin:    General: Skin is warm and dry.     Comments: Bruise on left buttocks, no other bruises noted   Neurological:     Mental Status: She is alert and oriented to person, place, and time.     Gait: Gait abnormal.  Comments: CN II-XII grossly intact, sensation intact throughout aside from BLE from knees to feet, 5/5 strength BUE/BLE, unable to perform tandem stance which she states is chronic, finger-to-nose with path deviation and overshooting, Positive Romberg  Immediate recall and delayed recall intact Able to state months backwards  Psychiatric:        Mood and Affect: Mood normal.        Behavior: Behavior normal.        Thought Content: Thought content normal.        Judgment: Judgment normal.     ED Results / Procedures / Treatments   Labs (all labs ordered are listed, but only abnormal results are displayed) Labs Reviewed  BASIC METABOLIC PANEL - Abnormal; Notable for the following components:      Result Value   Sodium 126 (*)    Chloride 96 (*)    CO2 20 (*)    Glucose, Bld 120 (*)    Creatinine, Ser 1.01 (*)    Calcium 8.1 (*)    All other components within normal limits  CBC - Abnormal; Notable for the following components:   RBC 3.31 (*)    Hemoglobin 11.0 (*)    HCT 31.2 (*)    All other components within normal limits  CBG MONITORING, ED - Abnormal; Notable for the following components:   Glucose-Capillary 131 (*)    All other components within normal limits  URINALYSIS, ROUTINE W REFLEX MICROSCOPIC  HEPATIC FUNCTION PANEL  MAGNESIUM    EKG EKG Interpretation  Date/Time:  Friday August 05 2020 12:02:24 EDT Ventricular Rate:  88 PR Interval:  110 QRS Duration: 80 QT Interval:  372 QTC Calculation: 450 R Axis:   28 Text Interpretation: Sinus rhythm with short PR Low voltage QRS Cannot rule out Anterior infarct , age undetermined Abnormal ECG Confirmed by Elnora Morrison [phone]) on 08/05/2020 2:13:43 PM   Radiology No results found.  Procedures Procedures   Medications Ordered in ED Medications - No data to display  ED Course  I have  reviewed the triage vital signs and the nursing notes.  Pertinent labs & imaging results that were available during my care of the patient were reviewed by me and considered in my medical decision making (see chart for details).    MDM Rules/Calculators/A&P                          PARISHA BEAULAC is a 60 y.o. female with presents with acute on chronic dizziness with recent falls.  Vitals are stable.  Neurologic testing is concerning for cerebellar stroke vs vestibular symptoms worsening in the setting of concussion, vs symptoms of hyponatremia given Na 126 on admission.  Unclear cause of hyponatremia, could consider medications, not on diuresis.  No history of cirrhosis or heart failure.  She does not drink alcohol.  Given that these symptoms have been occurring for days, would not give hypertonic saline.  Will give gentle bolus of 500cc NS and order MRI to further assess for stroke vs bleed.  She will likely require admission for stroke vs. Hyponatremia.  She does not appear to have any signs of trauma to other areas of her body, therefore no other imaging indicated.  Patient signed out to Dr. Vanita Panda.   Final Clinical Impression(s) / ED Diagnoses Final diagnoses:  None    Rx / DC Orders ED Discharge Orders    None       Jasmina Gendron, Bernita Raisin,  DO 08/05/20 1612    Elnora Morrison, MD 08/06/20 1020

## 2020-08-05 NOTE — Telephone Encounter (Signed)
[facility] - Client TELEPHONE ADVICE RECORD Access[facility]urse Patient Name: Mallory Stewart MCNULT Y Gender: Female DOB: February 11, 1961 Age: 60 Y 6 M 15 D Return Phone Number: [phone] (Primary), [phone] (Secondary) Address: City/ State/ Zip: [place] [address] Client [facility] - Client Client Site [facility] - Day Physician Kerby Nora - MD Contact Type Call Who Is Calling Patient / Member / Family / Caregiver Call Type Triage / Clinical Relationship To Patient Self Return Phone Number [phone] (Secondary) Chief Complaint HEAD INJURY - and not acting right. Change in behaviour after hitting head. Reason for Call Symptomatic / Request for Health Information Initial Comment Caller states she has dizziness. Has already fell, four times in the last eight days. She fell last night. She hit her head when she fell on the tile floor, Monday night she fell on the concrete. GOTO Facility Not Listed [facility] Translation No Nurse Assessment Nurse: Risa Grill, RN, Lambert Mody Date/Time Lamount Cohen Time): 08/05/2020 9:44:25 AM Confirm and document reason for call. If symptomatic, describe symptoms. ---Caller states she has dizziness. Has already fell, four times in the last eight days. She fell last night. Has hit her head when she fell on the tile floor 3 different times , Monday night she fell on the concrete Does the patient have any new or worsening symptoms? ---Yes Will a triage be completed? ---Yes Related visit to physician within the last 2 weeks? ---No Does the PT have any chronic conditions? (i.e. diabetes, asthma, this includes High risk factors for pregnancy, etc.) ---Yes List chronic conditions. ---DM Is this a behavioral health or substance abuse call? ---No Guidelines Guideline Title Affirmed Question Affirmed Notes Nurse Date/Time (Eastern Time) Head Injury [1] ACUTE NEURO SYMPTOM AND  [2] now fine (DEFINITION: difficult to awaken OR confused thinking Risa Grill, RN, Lambert Mody 08/05/2020 9:45:29 AM PLEASE NOTE: All timestamps contained within this report are represented as [place] Standard Time. CONFIDENTIALTY NOTICE: This fax transmission is intended only for the addressee. It contains information that is legally privileged, confidential or otherwise protected from use or disclosure. If you are not the intended recipient, you are strictly prohibited from reviewing, disclosing, copying using or disseminating any of this information or taking any action in reliance on or regarding this information. If you have received this fax in error, please notify us immediately by telephone so that we can arrange for its return to [place]. Phone: [phone], Toll-Free: [phone], Fax: [phone] Page: 2 of 4 Call Id: [id number] Guidelines Guideline Title Affirmed Question Affirmed Notes Nurse Date/Time Lamount Cohen Time) and talking OR slurred speech OR weakness of arms OR unsteady walking) Disp. Time Lamount Cohen Time) Disposition Final User 08/05/2020 9:42:18 AM Send to Urgent Tenna Child 08/05/2020 9:47:33 AM Go to ED Now (or PCP triage) Yes Risa Grill, RN, Cyndia Diver Disagree/Comply Comply Caller Understands Yes PreDisposition Did not know what to do Care Advice Given Per Guideline GO TO ED NOW (OR PCP TRIAGE): * IF NO PCP (PRIMARY CARE PROVIDER) Stewart-LEVEL TRIAGE: You need to be seen within the next hour. Go to the ED/UCC at _____________ Hospital. Leave as soon as you can. ANOTHER ADULT SHOULD DRIVE: * It is better and safer if another adult drives instead of you. USE A COLD PACK FOR PAIN, SWELLING, OR BRUISING: * Put a cold pack or an ice bag (wrapped in a moist towel) on the area for 20 minutes. CARE ADVICE given per Head Injury (Adult) guideline. Referrals GO TO FACILITY OTHER -  SPECIFY Triage Details User: Melany Guernsey Date/Time: 08/05/2020 9:45:29 AM Triage  Guideline: Head Injury [1] ACUTE NEURO SYMPTOM AND [2] present now (DEFINITION: difficult to awaken OR confused thinking and talking OR slurred speech OR weakness of arms OR unsteady walking) -----No Knocked out (unconscious) > 1 minute -----No Seizure (convulsion) occurred (Exception: prior history of seizures and now alert and without Acute Neuro Symptoms) -----No Penetrating head injury (e.g., knife, gun shot wound, metal object) PLEASE NOTE: All timestamps contained within this report are represented as [place] Standard Time. CONFIDENTIALTY NOTICE: This fax transmission is intended only for the addressee. It contains information that is legally privileged, confidential or otherwise protected from use or disclosure. If you are not the intended recipient, you are strictly prohibited from reviewing, disclosing, copying using or disseminating any of this information or taking any action in reliance on or regarding this information. If you have received this fax in error, please notify us immediately by telephone so that we can arrange for its return to [place]. Phone: [phone], Toll-Free: [phone], Fax: [phone] Page: 3 of 4 Call Id: [id number] Triage Details -----No [1] Major bleeding (e.g., actively dripping or spurting) AND [2] can't be stopped -----No [1] Dangerous mechanism of injury (e.g., MVA, diving, trampoline, contact sports, fall > 10 feet or 3 meters) AND [2] NECK pain AND [3] began < 1 hour after injury -----No Sounds like a life-threatening emergency to the triager -----No [1] Diagnosed with concussion AND [2] within last 14 days -----No [1] Traumatic brain injury (mTBI; concussion) AND [2] more than 14 days since head injury -----No Can't remember what happened (amnesia) -----No Vomiting once or more -----No [1] Loss of vision or double vision AND [2] present now -----No Watery or blood-tinged fluid dripping from the NOSE or EARS now (Exception: tears from  crying or nosebleed from nasal trauma) -----No [1] One or two "black eyes" (bruising, purple color of eyelids) AND [2] onset within 24 hours of head injury -----No Large swelling or bruise > 2 inches (5 cm) -----No Skin is split open or gaping (or length > 1/2 inch or 12 mm) -----No [1] Bleeding AND [2] won't stop after 10 minutes of direct pressure (using correct technique) -----No Sounds like a serious injury to the triager -----No [1] ACUTE NEURO SYMPTOM AND [2] now fine (DEFINITION: difficult to awaken OR confused thinking and talking OR slurred speech OR weakness of arms OR unsteady walking) -----Yes Disposition: Go to ED Now (or PCP triage) PLEASE NOTE: All timestamps contained within this report are represented as [place] Standard Time. CONFIDENTIALTY NOTICE: This fax transmission is intended only for the addressee. It contains information that is legally privileged, confidential or otherwise protected from use or disclosure. If you are not the intended recipient, you are strictly prohibited from reviewing, disclosing, copying using or disseminating any of this information or taking any action in reliance on or regarding this information. If you have received this fax in error, please notify us immediately by telephone so that we can arrange for its return to [place]. Phone: [phone], Toll-Free: [phone], Fax: [phone] Page: 4 of 4 Call Id: [id number] Triage Details GO TO ED NOW (OR PCP TRIAGE): * IF NO PCP (PRIMARY CARE PROVIDER) Stewart-LEVEL TRIAGE: You need to be seen within the next hour. Go to the ED/UCC at _____________ Hospital. Leave as soon as you can. ANOTHER ADULT SHOULD DRIVE: * It is better and safer if another adult drives instead of you. USE A COLD PACK FOR PAIN, SWELLING, OR BRUISING: *  Put a cold pack or an ice bag (wrapped in a moist towel) on the area for 20 minutes. CARE ADVICE given per Head Injury (Adult) guideline

## 2020-08-05 NOTE — ED Notes (Signed)
Pt transported to MRI 

## 2020-08-05 NOTE — ED Provider Notes (Addendum)
Care of the patient assumed at signout. Physical Exam  BP 140/85   Pulse (!) 102   Temp 99.2 F (37.3 C)   Resp 17   SpO2 97%  9:13 PM  Physical Exam General: Awake and alert no distress Neuro: Moves all extremities spontaneously, moves her head freely, has minimal subjective dizziness  ED Course/Procedures    Patient notes that she is feeling better, but is still unable to walk, due to weakness and unsteadiness. We again discussed her findings, concerning for electrolyte abnormalities including profound hypomagnesemia, and hyponatremia. Patient will continue fluid resuscitation, electrolyte repletion, will be admitted for further monitoring and management.   Gerhard Munch, MD 08/05/20 2114    Gerhard Munch, MD 08/05/20 2231

## 2020-08-05 NOTE — Telephone Encounter (Signed)
I spoke with pt;pt is not sure if she is going to ED or not; pt wonders if could be an inner ear issue; pt has weakness in legs and no feeling from knees down for 4 - 5 years. No H/A no vision changes; pt said her body is sore all over; pt said she understands importance of being evaluated after fall where hits head;  she recently lost a family member due to a fall and pt was bleeding in his brain.  Pt said when her husband gets home within the hour pt will go to [facility] ED and will call [facility] with update next wk. Sending note to Dr Ermalene Searing.

## 2020-08-05 NOTE — ED Triage Notes (Signed)
PT stated dizziness started about 15 days ago. PCP saw pt and confirmed fluid in ears, and put her on zyrtec. Multiple falls with worsening dizziness. Pt states she can feel/hear fluid moving around.

## 2020-08-06 DIAGNOSIS — R140 Abdominal distension (gaseous): Secondary | ICD-10-CM | POA: Diagnosis present

## 2020-08-06 DIAGNOSIS — R531 Weakness: Secondary | ICD-10-CM

## 2020-08-06 DIAGNOSIS — E440 Moderate protein-calorie malnutrition: Secondary | ICD-10-CM | POA: Diagnosis present

## 2020-08-06 DIAGNOSIS — R42 Dizziness and giddiness: Secondary | ICD-10-CM | POA: Diagnosis present

## 2020-08-06 DIAGNOSIS — E1140 Type 2 diabetes mellitus with diabetic neuropathy, unspecified: Secondary | ICD-10-CM | POA: Diagnosis present

## 2020-08-06 DIAGNOSIS — K219 Gastro-esophageal reflux disease without esophagitis: Secondary | ICD-10-CM | POA: Diagnosis present

## 2020-08-06 DIAGNOSIS — J302 Other seasonal allergic rhinitis: Secondary | ICD-10-CM | POA: Diagnosis present

## 2020-08-06 DIAGNOSIS — E785 Hyperlipidemia, unspecified: Secondary | ICD-10-CM | POA: Diagnosis present

## 2020-08-06 DIAGNOSIS — W19XXXA Unspecified fall, initial encounter: Secondary | ICD-10-CM | POA: Diagnosis not present

## 2020-08-06 DIAGNOSIS — G8929 Other chronic pain: Secondary | ICD-10-CM | POA: Diagnosis present

## 2020-08-06 DIAGNOSIS — R627 Adult failure to thrive: Secondary | ICD-10-CM | POA: Diagnosis present

## 2020-08-06 DIAGNOSIS — Y92009 Unspecified place in unspecified non-institutional (private) residence as the place of occurrence of the external cause: Secondary | ICD-10-CM

## 2020-08-06 DIAGNOSIS — K760 Fatty (change of) liver, not elsewhere classified: Secondary | ICD-10-CM | POA: Diagnosis present

## 2020-08-06 DIAGNOSIS — K7460 Unspecified cirrhosis of liver: Secondary | ICD-10-CM | POA: Diagnosis present

## 2020-08-06 DIAGNOSIS — R296 Repeated falls: Secondary | ICD-10-CM | POA: Diagnosis present

## 2020-08-06 DIAGNOSIS — R188 Other ascites: Secondary | ICD-10-CM | POA: Diagnosis present

## 2020-08-06 DIAGNOSIS — W0110XA Fall on same level from slipping, tripping and stumbling with subsequent striking against unspecified object, initial encounter: Secondary | ICD-10-CM | POA: Diagnosis present

## 2020-08-06 DIAGNOSIS — I10 Essential (primary) hypertension: Secondary | ICD-10-CM | POA: Diagnosis present

## 2020-08-06 DIAGNOSIS — E871 Hypo-osmolality and hyponatremia: Secondary | ICD-10-CM | POA: Diagnosis present

## 2020-08-06 DIAGNOSIS — D696 Thrombocytopenia, unspecified: Secondary | ICD-10-CM | POA: Diagnosis present

## 2020-08-06 DIAGNOSIS — E1169 Type 2 diabetes mellitus with other specified complication: Secondary | ICD-10-CM | POA: Diagnosis present

## 2020-08-06 DIAGNOSIS — Z9049 Acquired absence of other specified parts of digestive tract: Secondary | ICD-10-CM | POA: Diagnosis not present

## 2020-08-06 DIAGNOSIS — Z20822 Contact with and (suspected) exposure to covid-19: Secondary | ICD-10-CM | POA: Diagnosis present

## 2020-08-06 DIAGNOSIS — D638 Anemia in other chronic diseases classified elsewhere: Secondary | ICD-10-CM | POA: Diagnosis present

## 2020-08-06 DIAGNOSIS — R7401 Elevation of levels of liver transaminase levels: Secondary | ICD-10-CM

## 2020-08-06 DIAGNOSIS — K860 Alcohol-induced chronic pancreatitis: Secondary | ICD-10-CM | POA: Diagnosis present

## 2020-08-06 DIAGNOSIS — Z6825 Body mass index (BMI) 25.0-25.9, adult: Secondary | ICD-10-CM | POA: Diagnosis not present

## 2020-08-06 DIAGNOSIS — E8809 Other disorders of plasma-protein metabolism, not elsewhere classified: Secondary | ICD-10-CM | POA: Diagnosis present

## 2020-08-06 DIAGNOSIS — M7989 Other specified soft tissue disorders: Secondary | ICD-10-CM | POA: Diagnosis not present

## 2020-08-06 DIAGNOSIS — R17 Unspecified jaundice: Secondary | ICD-10-CM

## 2020-08-06 LAB — COMPREHENSIVE METABOLIC PANEL
ALT: 26 U/L (ref 0–44)
AST: 70 U/L — ABNORMAL HIGH (ref 15–41)
Albumin: 2.7 g/dL — ABNORMAL LOW (ref 3.5–5.0)
Alkaline Phosphatase: 263 U/L — ABNORMAL HIGH (ref 38–126)
Anion gap: 11 (ref 5–15)
BUN: 8 mg/dL (ref 6–20)
CO2: 19 mmol/L — ABNORMAL LOW (ref 22–32)
Calcium: 8.1 mg/dL — ABNORMAL LOW (ref 8.9–10.3)
Chloride: 99 mmol/L (ref 98–111)
Creatinine, Ser: 0.92 mg/dL (ref 0.44–1.00)
GFR, Estimated: >60 mL/min (ref >60)
Glucose, Bld: 144 mg/dL — ABNORMAL HIGH (ref 70–99)
Potassium: 4.4 mmol/L (ref 3.5–5.1)
Sodium: 129 mmol/L — ABNORMAL LOW (ref 135–145)
Total Bilirubin: 2.8 mg/dL — ABNORMAL HIGH (ref 0.3–1.2)
Total Protein: 6.2 g/dL — ABNORMAL LOW (ref 6.5–8.1)

## 2020-08-06 LAB — CBC
HCT: 29 % — ABNORMAL LOW (ref 36.0–46.0)
Hemoglobin: 10.2 g/dL — ABNORMAL LOW (ref 12.0–15.0)
MCH: 33.4 pg (ref 26.0–34.0)
MCHC: 35.2 g/dL (ref 30.0–36.0)
MCV: 95.1 fL (ref 80.0–100.0)
Platelets: 136 10*3/uL — ABNORMAL LOW (ref 150–400)
RBC: 3.05 MIL/uL — ABNORMAL LOW (ref 3.87–5.11)
RDW: 14.8 % (ref 11.5–15.5)
WBC: 8.6 10*3/uL (ref 4.0–10.5)
nRBC: 0 % (ref 0.0–0.2)

## 2020-08-06 LAB — GLUCOSE, CAPILLARY: Glucose-Capillary: 203 mg/dL — ABNORMAL HIGH (ref 70–99)

## 2020-08-06 LAB — PROTIME-INR
INR: 1.7 — ABNORMAL HIGH (ref 0.8–1.2)
Prothrombin Time: 19 seconds — ABNORMAL HIGH (ref 11.4–15.2)

## 2020-08-06 LAB — OSMOLALITY: Osmolality: 274 mOsm/kg — ABNORMAL LOW (ref 275–295)

## 2020-08-06 LAB — PHOSPHORUS: Phosphorus: 2.8 mg/dL (ref 2.5–4.6)

## 2020-08-06 LAB — HIV ANTIBODY (ROUTINE TESTING W REFLEX): HIV Screen 4th Generation wRfx: NONREACTIVE

## 2020-08-06 LAB — SARS CORONAVIRUS 2 (TAT 6-24 HRS): SARS Coronavirus 2: NEGATIVE

## 2020-08-06 LAB — CBG MONITORING, ED
Glucose-Capillary: 120 mg/dL — ABNORMAL HIGH (ref 70–99)
Glucose-Capillary: 114 mg/dL — ABNORMAL HIGH (ref 70–99)
Glucose-Capillary: 170 mg/dL — ABNORMAL HIGH (ref 70–99)

## 2020-08-06 LAB — APTT: aPTT: 42 seconds — ABNORMAL HIGH (ref 24–36)

## 2020-08-06 LAB — MAGNESIUM: Magnesium: 1.4 mg/dL — ABNORMAL LOW (ref 1.7–2.4)

## 2020-08-06 MED ORDER — VITAMIN B-12 1000 MCG PO TABS
2000.0000 ug | ORAL_TABLET | Freq: Every day | ORAL | Status: DC
  Administered 2020-08-06 – 2020-08-08 (×3): 2000 ug via ORAL
  Filled 2020-08-06 (×3): qty 2

## 2020-08-06 MED ORDER — PANTOPRAZOLE SODIUM 40 MG PO TBEC
40.0000 mg | DELAYED_RELEASE_TABLET | Freq: Two times a day (BID) | ORAL | Status: DC
  Administered 2020-08-06 – 2020-08-08 (×5): 40 mg via ORAL
  Filled 2020-08-06 (×5): qty 1

## 2020-08-06 MED ORDER — METOPROLOL TARTRATE 50 MG PO TABS
50.0000 mg | ORAL_TABLET | Freq: Two times a day (BID) | ORAL | Status: DC
  Administered 2020-08-06 – 2020-08-08 (×6): 50 mg via ORAL
  Filled 2020-08-06: qty 2
  Filled 2020-08-06: qty 1
  Filled 2020-08-06: qty 2
  Filled 2020-08-06 (×3): qty 1

## 2020-08-06 MED ORDER — MAGNESIUM SULFATE 2 GM/50ML IV SOLN
2.0000 g | Freq: Once | INTRAVENOUS | Status: AC
  Administered 2020-08-06: 2 g via INTRAVENOUS
  Filled 2020-08-06: qty 50

## 2020-08-06 MED ORDER — INSULIN ASPART 100 UNIT/ML ~~LOC~~ SOLN
0.0000 [IU] | Freq: Every day | SUBCUTANEOUS | Status: DC
  Administered 2020-08-06: 2 [IU] via SUBCUTANEOUS

## 2020-08-06 MED ORDER — GLUCERNA SHAKE PO LIQD
237.0000 mL | Freq: Three times a day (TID) | ORAL | Status: DC
  Administered 2020-08-06 – 2020-08-08 (×7): 237 mL via ORAL
  Filled 2020-08-06 (×3): qty 237

## 2020-08-06 MED ORDER — MECLIZINE HCL 25 MG PO TABS
25.0000 mg | ORAL_TABLET | Freq: Two times a day (BID) | ORAL | Status: DC
  Administered 2020-08-06 – 2020-08-08 (×6): 25 mg via ORAL
  Filled 2020-08-06 (×8): qty 1

## 2020-08-06 MED ORDER — LOSARTAN POTASSIUM 50 MG PO TABS
100.0000 mg | ORAL_TABLET | Freq: Every day | ORAL | Status: DC
  Administered 2020-08-06 – 2020-08-08 (×3): 100 mg via ORAL
  Filled 2020-08-06 (×3): qty 2

## 2020-08-06 MED ORDER — SODIUM CHLORIDE 0.9 % IV SOLN: INTRAVENOUS | Status: DC

## 2020-08-06 MED ORDER — INSULIN ASPART 100 UNIT/ML ~~LOC~~ SOLN
0.0000 [IU] | Freq: Three times a day (TID) | SUBCUTANEOUS | Status: DC
  Administered 2020-08-07 – 2020-08-08 (×3): 2 [IU] via SUBCUTANEOUS

## 2020-08-06 MED ORDER — ALPRAZOLAM 0.25 MG PO TABS
0.2500 mg | ORAL_TABLET | Freq: Every day | ORAL | Status: DC | PRN
  Administered 2020-08-06 – 2020-08-07 (×2): 0.25 mg via ORAL
  Filled 2020-08-06 (×2): qty 1

## 2020-08-06 NOTE — ED Notes (Signed)
Called [facility]RT to send a gluten free breakfast tray to pt room.

## 2020-08-06 NOTE — ED Notes (Signed)
Gluten free lunch tray ordered

## 2020-08-06 NOTE — ED Notes (Signed)
Attempted to call report; RN requested 10 minutes.

## 2020-08-06 NOTE — Evaluation (Signed)
Physical Therapy Evaluation Patient Details Name: Mallory Stewart MRN: [id number] DOB: April 23, 1961 Today's Date: 08/06/2020   History of Present Illness  Mallory Stewart is a 60 y.o. female with medical history significant for, hypertension, GERD who presents to the emergency department due to 1 week onset of worsening unsteadiness, dizziness and fall within last 2 weeks with most recent fall being last night.  Patient thought that this may be related to fluid buildup in the ears.  Patient endorsed history of chronic balance problems and lack of sensation in legs bilaterally below the knees.  Clinical Impression  Patient presents with decreased balance, decreased activity tolerance and at high risk for falls.  She has symptoms which seem to present as R vestibular hypofunction likely to due vestibular neuritis.  Patient educated with handout on seated vestibular adaptation exercises.  She will need RW for home and follow up outpatient vestibular rehab.  PT to follow up if remains on acute.   Vestibular Assessment - 08/06/20 0001      Symptom Behavior   Subjective history of current problem Patient reports 3 week history of dizziness, imbalance, disorientation to the point she has fallen 2-3 times and even hit head on pavement, though MRI normal.  She has h/o numbness in feet and lower legs as well.  Reports allergy symptoms with severe post nasal drip for some weeks prior to symptoms even vomiting a little due to mucous in the mornings.    Type of Dizziness  Imbalance;Unsteady with head/body turns;Vertigo    Frequency of Dizziness intermittent    Duration of Dizziness minutes    Symptom Nature Motion provoked;Variable;Intermittent    Aggravating Factors Activity in general;Turning head quickly;Sit to stand    Relieving Factors Lying supine;Rest;Slow movements    Progression of Symptoms Better    History of similar episodes No      Oculomotor Exam   Oculomotor Alignment Abnormal   L eye  lower, but possibly due to facial asymmetry   Ocular ROM WFL    Spontaneous Absent    Gaze-induced  Absent    Head shaking Horizontal Absent    Smooth Pursuits Intact    Saccades Hypometric   for vertical saccades only and pt reports some woozy symptoms with vertical saccades     Oculomotor Exam-Fixation Suppressed    Left Head Impulse WFL    Right Head Impulse positive for refixation saccade      Vestibulo-Ocular Reflex   VOR 1 Head Only (x 1 viewing) able to perform both with vertical and horizontal head movements, but more symptomatic with horizontal head movement; feels with horizontal rotation feels easier going to R than L    VOR Cancellation Normal      Auditory   Comments intact and equal to scratch test      Positional Testing   Sidelying Test Sidelying Left;Sidelying Right      Sidelying Right   Sidelying Right Duration 60 sec    Sidelying Right Symptoms No nystagmus      Sidelying Left   Sidelying Left Duration 60 sec    Sidelying Left Symptoms No nystagmus             Follow Up Recommendations Outpatient PT (for vestibular rehab)    Equipment Recommendations  Rolling walker with 5" wheels    Recommendations for Other Services       Precautions / Restrictions Precautions Precautions: Fall      Mobility  Bed Mobility Overal bed mobility: Modified Independent  Transfers Overall transfer level: Needs assistance Equipment used: None;Rolling walker (2 wheeled) Transfers: Sit to/from Stand Sit to Stand: Min guard         General transfer comment: for balance  Ambulation/Gait Ambulation/Gait assistance: Min assist;Supervision Gait Distance (Feet): 200 Feet Assistive device: Rolling walker (2 wheeled);1 person hand held assist Gait Pattern/deviations: Step-through pattern;Decreased stride length;Shuffle     General Gait Details: cautious and slow, reports feels shaky and needed HHA initially, then obtained RW and able to  use for ambulation with close S to CGA.  Educated in using RW initially with spouse assist for safety, but reports supposed to go on flight to big work conference in 1.5 weeks so encourged traveling companion, contacting airline to get assistance for accessing flights, etc.  Information systems manager [place] (Stroke Patients Only)       Balance Overall balance assessment: Needs assistance Sitting-balance support: Feet supported Sitting balance-Leahy Scale: Good     Standing balance support: No upper extremity supported Standing balance-Leahy Scale: Poor Standing balance comment: currently needs UE support for balance                             Pertinent Vitals/Pain Pain Assessment: Faces Faces Pain Scale: Hurts little more Pain Location: headache Pain Descriptors / Indicators: Headache Pain Intervention(s): Monitored during session    Home Living Family/patient expects to be discharged to:: Private residence Living Arrangements: Spouse/significant other Available Help at Discharge: Family Type of Home: House Home Access: Level entry     Home Layout: Two level;Able to live on main level with bedroom/bathroom Home Equipment: None;Shower seat - built in Additional Comments: home was made to be w/c accessible    Prior Function Level of Independence: Independent               Hand Dominance        Extremity/Trunk Assessment   Upper Extremity Assessment Upper Extremity Assessment: Overall WFL for tasks assessed    Lower Extremity Assessment Lower Extremity Assessment: RLE deficits/detail;LLE deficits/detail RLE Deficits / Details: stiffness/soreness from falls, but moves antigravity and coordination generally WFL RLE Sensation: decreased light touch;decreased proprioception (proprioception absent in great toe, better at ankle) LLE Deficits / Details: stiffness/soreness from falls, but moves antigravity and coordination  generally WFL LLE Sensation: decreased light touch;decreased proprioception (proprioception absent in great toe, better at ankle)       Communication   Communication: No difficulties  Cognition Arousal/Alertness: Awake/alert Behavior During Therapy: WFL for tasks assessed/performed Overall Cognitive Status: Within Functional Limits for tasks assessed                                        General Comments General comments (skin integrity, edema, etc.): Education given regarding Vestibular Neuritis and HEP for vestibular adaptation seated as well as referral form for outpatient rehab in case referral not sent at d/c.    Exercises     Assessment/Plan    PT Assessment Patient needs continued PT services  PT Problem List Decreased activity tolerance;Decreased balance;Decreased knowledge of use of DME;Decreased mobility;Decreased safety awareness;Other (comment) (dizziness)       PT Treatment Interventions DME instruction;Therapeutic activities;Therapeutic exercise;Gait training;Balance training;Functional mobility training;Patient/family education    PT Goals (Current goals can be found in the Care  Plan section)  Acute Rehab PT Goals Patient Stated Goal: to return to independent PT Goal Formulation: With patient Time For Goal Achievement: 08/20/20 Potential to Achieve Goals: Good    Frequency Min 3X/week   Barriers to discharge        Co-evaluation               AM-PAC PT "6 Clicks" Mobility  Outcome Measure Help needed turning from your back to your side while in a flat bed without using bedrails?: None Help needed moving from lying on your back to sitting on the side of a flat bed without using bedrails?: None Help needed moving to and from a bed to a chair (including a wheelchair)?: A Little Help needed standing up from a chair using your arms (e.g., wheelchair or bedside chair)?: A Little Help needed to walk in hospital room?: A Little Help  needed climbing 3-5 steps with a railing? : A Little 6 Click Score: 20    End of Session   Activity Tolerance: Patient tolerated treatment well Patient left: in bed;with call bell/phone within reach   PT Visit Diagnosis: Other abnormalities of gait and mobility (R26.89);Dizziness and giddiness (R42)    Time: 9937-1696 PT Time Calculation (min) (ACUTE ONLY): 56 min   Charges:   PT Evaluation $PT Eval Moderate Complexity: 1 Mod PT Treatments $Gait Training: 8-22 mins $Neuromuscular Re-education: 23-37 mins        Sheran Lawless, PT Acute Rehabilitation Services [phone] Office:[phone] 08/06/2020   Elray Mcgregor 08/06/2020, 2:38 PM

## 2020-08-06 NOTE — H&P (Signed)
History and Physical  Mallory Stewart PXT:[id number] DOB: 1960/10/15 DOA: 08/05/2020  Referring physician: Carmin Muskrat, MD PCP: [name], MD  Patient coming from: Home  Chief Complaint: Dizziness, fall  HPI: Mallory Stewart is a 60 y.Stewart. female with medical history significant for, hypertension, GERD who presents to the emergency department due to 1 week onset of worsening unsteadiness, dizziness and fall within last 2 weeks with most recent fall being last night.  Patient thought that this may be related to fluid buildup in the ears.  Patient endorsed history of chronic balance problems and lack of sensation in legs bilaterally below the knees due to postconcussive disorder, this has worsened since onset of symptoms.  She denies loss of consciousness with the falls.  Dizziness worsens with change of position.  ED Course:  In the emergency department, she was intermittently tachycardic, BP was 129/93, other vital signs are within normal range.  Work-up in the ED showed hypoalbuminemia, elevated liver enzymes, elevated total bilirubin, hypomagnesemia, hyponatremia, normocytic anemia.  Urinalysis was unimpressive for UTI. MRI of brain without contrast showed no acute intracranial abnormality or significant interval change. IV hydration was provided, magnesium was replenished.  Hospitalist was asked to admit patient for further evaluation and management.  Review of Systems: Constitutional: Negative for chills and fever.  HENT: Positive for sensation of fluid in the ears.  Negative for  sore throat.   Eyes: Negative for pain and visual disturbance.  Respiratory: Negative for cough, chest tightness and shortness of breath.   Cardiovascular: Negative for chest pain and palpitations.  Gastrointestinal: Negative for abdominal pain and vomiting.  Endocrine: Negative for polyphagia and polyuria.  Genitourinary: Negative for decreased urine volume, dysuria, enuresis Musculoskeletal:  Negative for arthralgias and back pain.  Skin: Negative for color change and rash.  Allergic/Immunologic: Negative for immunocompromised state.  Neurological: Positive for lack of sensation from below the knees to the feet bilaterally (chronic).  Negative for tremors, syncope, speech difficulty Hematological: Does not bruise/bleed easily.  All other systems reviewed and are negative    Past Medical History:  Diagnosis Date  . Alcohol-induced chronic pancreatitis ([place])   . B12 deficiency   . Concussion   . DDD (degenerative disc disease), cervical   . Diabetes ([place])   . DKA, type 2 ([place])   . Gastric outlet obstruction 08/12/2018  . Hypertension   . Neuropathy   . Seasonal allergies    Past Surgical History:  Procedure Laterality Date  . BIOPSY  01/19/2019   Procedure: BIOPSY;  Surgeon: Rush Landmark Telford Nab., MD;  Location: [facility];  Service: Gastroenterology;;  . CHOLECYSTECTOMY N/A 07/28/2016   Procedure: LAPAROSCOPIC CHOLECYSTECTOMY WITH INTRAOPERATIVE CHOLANGIOGRAM;  Surgeon: Mickeal Skinner, MD;  Location: [facility];  Service: General;  Laterality: N/A;  . ESOPHAGOGASTRODUODENOSCOPY (EGD) WITH PROPOFOL N/A 08/13/2018   Procedure: ESOPHAGOGASTRODUODENOSCOPY (EGD) WITH PROPOFOL;  Surgeon: Irving Copas., MD;  Location: [facility];  Service: Gastroenterology;  Laterality: N/A;  . ESOPHAGOGASTRODUODENOSCOPY (EGD) WITH PROPOFOL N/A 01/19/2019   Procedure: ESOPHAGOGASTRODUODENOSCOPY (EGD) WITH PROPOFOL;  Surgeon: Rush Landmark Telford Nab., MD;  Location: [facility];  Service: Gastroenterology;  Laterality: N/A;  . LEEP  1990's  . ORIF FOOT FRACTURE  09/2008   L 5th metatarsal  . REFRACTIVE SURGERY  2000  . TONSILLECTOMY    . UPPER ESOPHAGEAL ENDOSCOPIC ULTRASOUND (EUS) N/A 08/13/2018   Procedure: UPPER ESOPHAGEAL ENDOSCOPIC ULTRASOUND (EUS);  Surgeon: Irving Copas., MD;  Location: [facility];  Service: Gastroenterology;  Laterality: N/A;  .  UPPER ESOPHAGEAL  ENDOSCOPIC ULTRASOUND (EUS) N/A 01/19/2019   Procedure: UPPER ESOPHAGEAL ENDOSCOPIC ULTRASOUND (EUS);  Surgeon: Lemar Lofty., MD;  Location: [facility] ENDOSCOPY;  Service: Gastroenterology;  Laterality: N/A;    Social History:  reports that she has never smoked. She has never used smokeless tobacco. She reports previous alcohol use of about 7.0 standard drinks of alcohol per week. She reports that she does not use drugs.   Allergies  Allergen Reactions  . Gluten Meal Other (See Comments)    Stomach distress  . Zocor [Simvastatin] Other (See Comments)    Elevated lfts?    Family History  Problem Relation Age of Onset  . Other Mother        tachycardia.Marland KitchenMarland Kitchen?afib  . Stroke Father        after hernia suegery  . Prostate cancer Father   . Other Father        global transient amnesia, unclear source  . Atrial fibrillation Sister   . Healthy Brother   . Healthy Brother   . Coronary artery disease Paternal Grandmother   . Heart attack Paternal Grandmother 36  . Brain cancer Maternal Grandfather        ?  Marland Kitchen Cancer Paternal Grandfather        ?  Marland Kitchen Breast cancer Maternal Grandmother     Prior to Admission medications   Medication Sig Start Date End Date Taking? Authorizing Provider  ALPRAZolam (XANAX) 0.25 MG tablet Take 1 tablet (0.25 mg total) by mouth daily as needed for anxiety. 06/21/20  Yes Bedsole, Amy E, MD  amitriptyline (ELAVIL) 50 MG tablet Take 1 tablet (50 mg total) by mouth at bedtime. 07/19/20  Yes Bedsole, Amy E, MD  azelastine (OPTIVAR) 0.05 % ophthalmic solution Place 1 drop into both eyes 2 (two) times daily. Patient taking differently: Place 1 drop into both eyes as needed (allergies). 05/13/20  Yes Bedsole, Amy E, MD  B-D UF III MINI PEN NEEDLES 31G X 5 MM MISC USE 4 TIMES A DAY AS DIRECTED 11/30/19  Yes Shamleffer, Konrad Dolores, MD  Blood Glucose Monitoring Suppl (ONE TOUCH ULTRA 2) w/Device KIT Use to check blood sugars two times day 08/01/18  Yes Bedsole, Amy E,  MD  cetirizine (ZYRTEC) 10 MG tablet Take 10 mg by mouth at bedtime.   Yes [provider]  Cyanocobalamin (B-12) 1000 MCG TBCR Take 2,000 mcg by mouth daily. 11/05/18  Yes Iva Boop, MD  dapagliflozin propanediol (FARXIGA) 10 MG TABS tablet Take 10 mg by mouth daily before breakfast. 08/20/19  Yes Shamleffer, Konrad Dolores, MD  insulin glargine, 2 Unit Dial, (TOUJEO MAX SOLOSTAR) 300 UNIT/ML Solostar Pen Inject 34 Units into the skin daily at 2 PM. Patient taking differently: Inject 30 Units into the skin daily at 2 PM. 06/08/20  Yes Shamleffer, Konrad Dolores, MD  Lancets [facility] ULTRASOFT) lancets Use to check blood sugar two times a day. 08/01/18  Yes Bedsole, Amy E, MD  losartan (COZAAR) 100 MG tablet Take 1 tablet (100 mg total) by mouth daily. 06/21/20  Yes Bedsole, Amy E, MD  metoprolol tartrate (LOPRESSOR) 50 MG tablet Take 1 tablet (50 mg total) by mouth 2 (two) times daily. 06/02/20  Yes Bedsole, Amy E, MD  Multiple Vitamin (MULTIVITAMIN WITH MINERALS) TABS tablet Take 1 tablet by mouth daily. 07/25/18  Yes Mikhail, Pensions consultant, DO  ONETOUCH ULTRA test strip CHECK BLOOD SUGAR TWO TIMES DAILY AS DIRECTED 03/23/20  Yes Bedsole, Amy E, MD  pantoprazole (PROTONIX) 40 MG  tablet TAKE 1 TABLET BY MOUTH TWICE DAILY BEFORE A MEAL Patient taking differently: Take 40 mg by mouth 2 (two) times daily before a meal. 05/16/20  Yes Gatha Mayer, MD  azelastine (ASTELIN) 0.1 % nasal spray Place 2 sprays into both nostrils 2 (two) times daily. Use in each nostril as directed Patient not taking: No sig reported 05/13/20   [name], MD  methocarbamol (ROBAXIN) 500 MG tablet Take 1 tablet (500 mg total) by mouth every 8 (eight) hours as needed for muscle spasms. Patient not taking: No sig reported 11/03/19   [name], MD    Physical Exam: BP 118/77 (BP Location: Left Arm)   Pulse 85   Temp 98.5 F (36.9 C) (Oral)   Resp 20   SpO2 98%   . General: 18 y.Stewart. year-old female well  developed well nourished in no acute distress.  Alert and oriented x3.  Marland Kitchen HEENT: NCAT, EOMI . Neck: Supple, trachea medial . Cardiovascular: Regular rate and rhythm with no rubs or gallops.  No thyromegaly or JVD noted. 2/4 pulses in all 4 extremities. Marland Kitchen Respiratory: Clear to auscultation with no wheezes or rales. Good inspiratory effort. . Abdomen: Soft nontender nondistended with normal bowel sounds x4 quadrants. . Muskuloskeletal: No cyanosis, clubbing or edema noted bilaterally . Neuro: Lack of sensation from below the knees to the feet bilaterally.  CN II-XII intact . Skin: No ulcerative lesions noted or rashes . Psychiatry: Mood is appropriate for condition and setting          Labs on Admission:  Basic Metabolic Panel: Recent Labs  Lab 08/05/20 1209 08/05/20 1500  NA 126*  --   K 4.4  --   CL 96*  --   CO2 20*  --   GLUCOSE 120*  --   BUN 9  --   CREATININE 1.01*  --   CALCIUM 8.1*  --   MG  --  1.1*   Liver Function Tests: Recent Labs  Lab 08/05/20 1500  AST 79*  ALT 29  ALKPHOS 291*  BILITOT 1.9*  PROT 6.6  ALBUMIN 2.9*   No results for input(s): LIPASE, AMYLASE in the last 168 hours. No results for input(s): AMMONIA in the last 168 hours. CBC: Recent Labs  Lab 08/05/20 1209  WBC 10.0  HGB 11.0*  HCT 31.2*  MCV 94.3  PLT 176   Cardiac Enzymes: No results for input(s): CKTOTAL, CKMB, CKMBINDEX, TROPONINI in the last 168 hours.  BNP (last 3 results) No results for input(s): BNP in the last 8760 hours.  ProBNP (last 3 results) No results for input(s): PROBNP in the last 8760 hours.  CBG: Recent Labs  Lab 08/05/20 1212  GLUCAP 131*    Radiological Exams on Admission: MR BRAIN WO CONTRAST  Result Date: 08/05/2020 CLINICAL DATA:  Multiple falls.  Dizziness.  Trauma to head. EXAM: MRI HEAD WITHOUT CONTRAST TECHNIQUE: Multiplanar, multiecho pulse sequences of the brain and surrounding structures were obtained without intravenous contrast.  COMPARISON:  CT head without contrast 10/23/2015. MR head without and with contrast 05/18/2013 FINDINGS: Brain: No acute infarct, hemorrhage, or mass lesion is present. Mild atrophy and white matter changes are stable. The ventricles are of normal size. No significant extraaxial fluid collection is present. Vascular: Flow is present in the major intracranial arteries. Skull and upper cervical spine: The craniocervical junction is normal. Upper cervical spine is within normal limits. Marrow signal is unremarkable. Sinuses/Orbits: The paranasal sinuses and mastoid air cells  are clear. The globes and orbits are within normal limits. IMPRESSION: 1. No acute intracranial abnormality or significant interval change. 2. Stable mild atrophy and white matter disease. This likely reflects the sequela of chronic microvascular ischemia. Electronically Signed   By: San Morelle M.D.   On: 08/05/2020 19:16    EKG: I independently viewed the EKG done and my findings are as followed: Normal sinus rhythm at a rate of 88 bpm with short PR interval.  Assessment/Plan Present on Admission: **None**  Principal Problem:   Generalized weakness Active Problems:   Dizziness   Hyponatremia   Hypomagnesemia   Hypoalbuminemia   Transaminitis   Serum total bilirubin elevated   Fall at home, initial encounter  Generalized weakness possibly secondary to multifactorial Fall in the setting of above  Continue fall precaution and neurochecks Continue PT/OT eval and treat  Dizziness/vertigo Patient endorsed fullness with sensation of movement of fluid in the ears She endorsed worsening of symptoms on changing position (supine to sitting/sitting to standing) Orthostatic vital signs will be checked Continue meclizine  Hyponatremia Na 126, she was started on IV hydration Orthostatic BP will be checked to rule out postural hypotension Continue gentle hydration Continue to monitor sodium with serial BMPs Urine,  serum osmolality and urine sodium will be checked  Hypomagnesemia Magnesium 1.1, this was replenished Continue to monitor magnesium level with morning labs  Hypoalbuminemia possibly secondary to moderate protein calorie malnutrition Albumin 2.9, protein supplement will be provided  Transaminitis/elevated total bilirubin AST 79, ALP 291, Total bilirubin 1.9 She denies abdominal pain Continue to monitor liver enzyme  Consider RUQ ultrasound for worsening of symptoms  Essential hypertension Continue losartan and Lopressor per home regimen  T2DM Continue ISS and hypoglycemic protocol Wilder [place]ade will be held at this time  GERD Continue Protonix  Anxiety Continue Xanax as needed   DVT prophylaxis: Lovenox  Code Status: Full code  Family Communication: None at bedside  Disposition Plan:  Patient is from:                        home Anticipated DC to:                   SNF or family members home Anticipated DC date:               1 day Anticipated DC barriers:           Patient is unstable to be discharged at this time due to dizziness, falls and electrolyte abnormalities that require replenishment   Consults called: None  Admission status: Observation    Bernadette Hoit MD [facility]  08/06/2020, 2:08 AM

## 2020-08-06 NOTE — Progress Notes (Signed)
PROGRESS NOTE    Mallory Stewart  XBM:[id number] DOB: February 16, 1961 DOA: 08/05/2020 PCP: Excell Seltzer, MD    Chief Complaint  Patient presents with  . Fall  . Dizziness    Brief Narrative:   No charge note, patient admitted early this morning, magnesium continued to be low, gave IV magnesium,  Sodium remain low , she continues have poor appetite ,continue hydration, will get PT eval, repeat lab work in the morning  Subjective:  She is seen in her room with husband at bedside  She denies pain,  Reports mild dizziness when turning to the right side, reports right side ear fullness, no nystagmus on exam Chronic loss of sensation to bilateral lower legs, with numbness and tingling, reports was evaluated at [facility] neurology,  Had a normal EMG study and was told nerves are working fine  Reports n/v for several weeks due to allergy meds side effect, no n/v for the last 18days, but still has poor appetite  Report has cut down drinking alcohol, now only drink occasionally     Assessment & Plan:   Principal Problem:   Generalized weakness Active Problems:   Dizziness   Hyponatremia   Hypomagnesemia   Hypoalbuminemia   Transaminitis   Serum total bilirubin elevated   Fall at home, initial encounter      Nutritional Assessment:  The patient's BMI is: Body mass index is 25.1 kg/m.Marland Kitchen  Seen by dietician.  I agree with the assessment and plan as outlined below:  Nutrition Status:        .     Skin Assessment:  I have examined the patient's skin and I agree with the wound assessment as performed by the wound care RN as outlined below:     Unresulted Labs (From admission, onward)          Start     Ordered   08/12/20 0500  Creatinine, serum  (enoxaparin (LOVENOX)    CrCl >/= 30 ml/min)  Weekly,   R     Comments: while on enoxaparin therapy    08/05/20 2311   08/06/20 0500  Protime-INR  Once,   R        08/06/20 0500   08/06/20 0500  APTT  Once,   R         08/06/20 0500   08/06/20 0219  Osmolality, urine  Once,   STAT        08/06/20 0218   08/06/20 0218  Sodium, urine, random  Once,   STAT        08/06/20 0218   08/05/20 2312  HIV Antibody (routine testing w rflx)  (HIV Antibody (Routine testing w reflex) panel)  Once,   STAT        08/05/20 2311            DVT prophylaxis: enoxaparin (LOVENOX) injection 40 mg Start: 08/06/20 1000 SCDs Start: 08/05/20 2312   Code Status: Family Communication: husband  Disposition:   Status is: Observation    Dispo: The patient is from:               Anticipated d/c is to:               Anticipated d/c date is:               Patient currently   Consultants:     Procedures:     Antimicrobials:        Objective: Vitals:   08/06/20  0915 08/06/20 0930 08/06/20 0945 08/06/20 1000  BP: (!) 135/92 128/89 (!) 131/97 (!) 141/94  Pulse: 87 87 85 85  Resp: 19 17 15 16   Temp:      TempSrc:      SpO2: 98% 98% 95% 96%  Weight:      Height:        Intake/Output Summary (Last 24 hours) at 08/06/2020 1115 Last data filed at 08/06/2020 0945 Gross per 24 hour  Intake 4900 ml  Output --  Net 4900 ml   Filed Weights   08/06/20 0250  Weight: 77.1 kg    Examination:  General exam: calm, NAD Respiratory system: Clear to auscultation. Respiratory effort normal. Cardiovascular system: S1 & S2 heard, RRR. No JVD, no murmur,  Gastrointestinal system: protuberance Abdomen, soft and nontender.  Normal bowel sounds heard. Central nervous system: Alert and oriented. No focal neurological deficits. Extremities: trace pitting edema bilateral lower extremity, decrease sensation lower legs and feet Skin: No rashes, lesions or ulcers Psychiatry: Judgement and insight appear normal. Mood & affect appropriate.     Data Reviewed: I have personally reviewed following labs and imaging studies  CBC: Recent Labs  Lab 08/05/20 1209 08/06/20 0301  WBC 10.0 8.6  HGB 11.0* 10.2*  HCT 31.2* 29.0*   MCV 94.3 95.1  PLT 176 136*    Basic Metabolic Panel: Recent Labs  Lab 08/05/20 1209 08/05/20 1500 08/06/20 0301  NA 126*  --  129*  K 4.4  --  4.4  CL 96*  --  99  CO2 20*  --  19*  GLUCOSE 120*  --  144*  BUN 9  --  8  CREATININE 1.01*  --  0.92  CALCIUM 8.1*  --  8.1*  MG  --  1.1* 1.4*  PHOS  --   --  2.8    GFR: Estimated Creatinine Clearance: 70.5 mL/min (by C-G formula based on SCr of 0.92 mg/dL).  Liver Function Tests: Recent Labs  Lab 08/05/20 1500 08/06/20 0301  AST 79* 70*  ALT 29 26  ALKPHOS 291* 263*  BILITOT 1.9* 2.8*  PROT 6.6 6.2*  ALBUMIN 2.9* 2.7*    CBG: Recent Labs  Lab 08/05/20 1212 08/06/20 0518 08/06/20 0748  GLUCAP 131* 120* 114*     Recent Results (from the past 240 hour(s))  SARS CORONAVIRUS 2 (TAT 6-24 HRS) Nasopharyngeal Nasopharyngeal Swab     Status: None   Collection Time: 08/05/20 10:22 PM   Specimen: Nasopharyngeal Swab  Result Value Ref Range Status   SARS Coronavirus 2 NEGATIVE NEGATIVE Final    Comment: (NOTE) SARS-CoV-2 target nucleic acids are NOT DETECTED.  The SARS-CoV-2 RNA is generally detectable in upper and lower respiratory specimens during the acute phase of infection. Negative results do not preclude SARS-CoV-2 infection, do not rule out co-infections with other pathogens, and should not be used as the sole basis for treatment or other patient management decisions. Negative results must be combined with clinical observations, patient history, and epidemiological information. The expected result is Negative.  Fact Sheet for Patients: 10/05/20  Fact Sheet for Healthcare Providers: HairSlick.no  This test is not yet approved or cleared by the [place]ir[place]ir.com FDA and  has been authorized for detection and/or diagnosis of SARS-CoV-2 by FDA under an Emergency Use Authorization (EUA). This EUA will remain  in effect (meaning this test can be  used) for the duration of the COVID-19 declaration under Se ction 564(b)(1) of the Act, 21 U.S.C. section 360bbb-3(b)(1),  unless the authorization is terminated or revoked sooner.  Performed at [facility], [address] [place], [place] [address]          Radiology Studies: MR BRAIN WO CONTRAST  Result Date: 08/05/2020 CLINICAL DATA:  Multiple falls.  Dizziness.  Trauma to head. EXAM: MRI HEAD WITHOUT CONTRAST TECHNIQUE: Multiplanar, multiecho pulse sequences of the brain and surrounding structures were obtained without intravenous contrast. COMPARISON:  CT head without contrast 10/23/2015. MR head without and with contrast 05/18/2013 FINDINGS: Brain: No acute infarct, hemorrhage, or mass lesion is present. Mild atrophy and white matter changes are stable. The ventricles are of normal size. No significant extraaxial fluid collection is present. Vascular: Flow is present in the major intracranial arteries. Skull and upper cervical spine: The craniocervical junction is normal. Upper cervical spine is within normal limits. Marrow signal is unremarkable. Sinuses/Orbits: The paranasal sinuses and mastoid air cells are clear. The globes and orbits are within normal limits. IMPRESSION: 1. No acute intracranial abnormality or significant interval change. 2. Stable mild atrophy and white matter disease. This likely reflects the sequela of chronic microvascular ischemia. Electronically Signed   By: Marin Roberts M.D.   On: 08/05/2020 19:16        Scheduled Meds: . enoxaparin (LOVENOX) injection  40 mg Subcutaneous Q24H  . feeding supplement (GLUCERNA SHAKE)  237 mL Oral TID BM  . insulin aspart  0-5 Units Subcutaneous QHS  . insulin aspart  0-9 Units Subcutaneous TID WC  . losartan  100 mg Oral Daily  . meclizine  25 mg Oral BID  . metoprolol tartrate  50 mg Oral BID  . pantoprazole  40 mg Oral BID AC  . vitamin B-12  2,000 mcg Oral Daily   Continuous Infusions: . sodium  chloride 75 mL/hr at 08/06/20 0842     LOS: 0 days   Time spent: mins Greater than 50% of this time was spent in counseling, explanation of diagnosis, planning of further management, and coordination of care.   Voice Recognition Reubin Milan dictation system was used to create this note, attempts have been made to correct errors. Please contact the author with questions and/or clarifications.   Albertine Grates, MD PhD FACP [facility]  Available via Epic secure chat 7am-7pm for nonurgent issues Please page for urgent issues To page the attending provider between 7A-7P or the covering provider during after hours 7P-7A, please log into the web site [email or web address] and access using universal [place] password for that web site. If you do not have the password, please call the hospital operator.    08/06/2020, 11:15 AM

## 2020-08-07 ENCOUNTER — Inpatient Hospital Stay (HOSPITAL_COMMUNITY): Payer: 59

## 2020-08-07 DIAGNOSIS — M7989 Other specified soft tissue disorders: Secondary | ICD-10-CM | POA: Diagnosis not present

## 2020-08-07 LAB — MAGNESIUM: Magnesium: 1.6 mg/dL — ABNORMAL LOW (ref 1.7–2.4)

## 2020-08-07 LAB — COMPREHENSIVE METABOLIC PANEL
ALT: 22 U/L (ref 0–44)
AST: 63 U/L — ABNORMAL HIGH (ref 15–41)
Albumin: 2.5 g/dL — ABNORMAL LOW (ref 3.5–5.0)
Alkaline Phosphatase: 212 U/L — ABNORMAL HIGH (ref 38–126)
Anion gap: 8 (ref 5–15)
BUN: 6 mg/dL (ref 6–20)
CO2: 22 mmol/L (ref 22–32)
Calcium: 8.4 mg/dL — ABNORMAL LOW (ref 8.9–10.3)
Chloride: 105 mmol/L (ref 98–111)
Creatinine, Ser: 0.82 mg/dL (ref 0.44–1.00)
GFR, Estimated: >60 mL/min (ref >60)
Glucose, Bld: 91 mg/dL (ref 70–99)
Potassium: 4.5 mmol/L (ref 3.5–5.1)
Sodium: 135 mmol/L (ref 135–145)
Total Bilirubin: 2 mg/dL — ABNORMAL HIGH (ref 0.3–1.2)
Total Protein: 5.7 g/dL — ABNORMAL LOW (ref 6.5–8.1)

## 2020-08-07 LAB — CBC WITH DIFFERENTIAL/PLATELET
Abs Immature Granulocytes: 0.04 10*3/uL (ref 0.00–0.07)
Basophils Absolute: 0.1 10*3/uL (ref 0.0–0.1)
Basophils Relative: 1 %
Eosinophils Absolute: 0.1 10*3/uL (ref 0.0–0.5)
Eosinophils Relative: 1 %
HCT: 27.6 % — ABNORMAL LOW (ref 36.0–46.0)
Hemoglobin: 9.9 g/dL — ABNORMAL LOW (ref 12.0–15.0)
Immature Granulocytes: 1 %
Lymphocytes Relative: 19 %
Lymphs Abs: 1.6 10*3/uL (ref 0.7–4.0)
MCH: 34.3 pg — ABNORMAL HIGH (ref 26.0–34.0)
MCHC: 35.9 g/dL (ref 30.0–36.0)
MCV: 95.5 fL (ref 80.0–100.0)
Monocytes Absolute: 0.9 10*3/uL (ref 0.1–1.0)
Monocytes Relative: 12 %
Neutro Abs: 5.4 10*3/uL (ref 1.7–7.7)
Neutrophils Relative %: 66 %
Platelets: 141 10*3/uL — ABNORMAL LOW (ref 150–400)
RBC: 2.89 MIL/uL — ABNORMAL LOW (ref 3.87–5.11)
RDW: 14.9 % (ref 11.5–15.5)
WBC: 8 10*3/uL (ref 4.0–10.5)
nRBC: 0 % (ref 0.0–0.2)

## 2020-08-07 LAB — GLUCOSE, CAPILLARY
Glucose-Capillary: 80 mg/dL (ref 70–99)
Glucose-Capillary: 154 mg/dL — ABNORMAL HIGH (ref 70–99)
Glucose-Capillary: 171 mg/dL — ABNORMAL HIGH (ref 70–99)
Glucose-Capillary: 177 mg/dL — ABNORMAL HIGH (ref 70–99)

## 2020-08-07 MED ORDER — ALBUMIN HUMAN 5 % IV SOLN
12.5000 g | INTRAVENOUS | Status: AC
  Administered 2020-08-08: 12.5 g via INTRAVENOUS
  Filled 2020-08-07: qty 250

## 2020-08-07 MED ORDER — FUROSEMIDE 40 MG PO TABS
40.0000 mg | ORAL_TABLET | Freq: Every day | ORAL | Status: DC
  Administered 2020-08-08: 40 mg via ORAL
  Filled 2020-08-07: qty 1

## 2020-08-07 MED ORDER — SENNOSIDES-DOCUSATE SODIUM 8.6-50 MG PO TABS
1.0000 | ORAL_TABLET | Freq: Two times a day (BID) | ORAL | Status: DC
  Administered 2020-08-08: 1 via ORAL
  Filled 2020-08-07 (×2): qty 1

## 2020-08-07 MED ORDER — MAGNESIUM SULFATE 4 GM/100ML IV SOLN
4.0000 g | Freq: Once | INTRAVENOUS | Status: AC
  Administered 2020-08-07: 4 g via INTRAVENOUS
  Filled 2020-08-07: qty 100

## 2020-08-07 MED ORDER — SPIRONOLACTONE 25 MG PO TABS
100.0000 mg | ORAL_TABLET | Freq: Every day | ORAL | Status: DC
  Administered 2020-08-08: 100 mg via ORAL
  Filled 2020-08-07: qty 4

## 2020-08-07 NOTE — Progress Notes (Signed)
PROGRESS NOTE    Mallory Stewart  NWG:[id number] DOB: October 21, 1960 DOA: 08/05/2020 PCP: Excell Seltzer, MD    Chief Complaint  Patient presents with  . Fall  . Dizziness    Brief Narrative:   Mallory Stewart is a 60 y.o. female with medical history significant for, hypertension, GERD who presents to the emergency department due to 1 week onset of worsening unsteadiness, dizziness and fall within last 2 weeks  Found to have electrolyte abnormalities, ascites and lower extremity edema  Subjective:  She is seen in her room with husband at bedside  She denies pain, reports dizziness has improved and feeling better Her abdomen appears distended, she reports it is being tight, but denies ab pain, no fever Continue to have leg edema   Report has cut down drinking alcohol, now only drink occasionally     Assessment & Plan:   Principal Problem:   Generalized weakness Active Problems:   Dizziness   Hyponatremia   Hypomagnesemia   Hypoalbuminemia   Transaminitis   Serum total bilirubin elevated   Fall at home, initial encounter   Hyponatremia -She reported poor oral intake last month ,sodium 126 on presentation, this is normalized after hydration  Hypomagnesemia Replaced, remain low, will give IV mag 4 g today  Abdominal distention  -Obtained abdominal ultrasound today showed ascites  -She has abnormal LFTs AST elevated 60-79/tbili 2.8/abnormal INR 1.7/mild thrombocytopenia plt 136-141 -CT abdomen obtained a year ago showed severe hepatic steatosis and mild enlargement of spleen -suspect early cirrhosis -she reports abdomen is being tight, denies pain, she agreed to diagnostic and therapeutic paracentesis, fluids study plus cytology ordered, albumin 2.5, will give albumin with paracentesis -also ordered autoimmune hepatitis panel and AFP/CEA -case discussed with GI Dr Chales Abrahams who agrees with currently plan and recommended outpatient GI follow up in one week. -Staff  message sent to Dr. Leone Payor    Bilateral lower extremity pitting edema Left > right Venous doppler no DVT Elevate legs  Normocytic anemia -No overt sign of external bleed -check stool guaiac -Likely anemia of chronic disease  Hypertension , stable ,continue Lopressor and Cozaar  Diabetes On Farxiga at home Currently on sliding scale insulin Recent A1c 5.8 in February 2022 A.m. blood glucose 91 today  Dizziness/vertigo Does has fluid seen right ear PT recommended outpatient vestibular rehab  Failure to thrive; PT recommended rolling walker   Nutritional Assessment: The patient's BMI is: Body mass index is 25.1 kg/m.Marland Kitchen  Seen by dietician.  I agree with the assessment and plan as outlined below:  Nutrition Status:          Unresulted Labs (From admission, onward)          Start     Ordered   08/12/20 0500  Creatinine, serum  (enoxaparin (LOVENOX)    CrCl >/= 30 ml/min)  Weekly,   R     Comments: while on enoxaparin therapy    08/05/20 2311   08/08/20 0500  Comprehensive metabolic panel  Tomorrow morning,   R        08/07/20 1616   08/08/20 0500  Magnesium  Tomorrow morning,   R        08/07/20 1616   08/08/20 0500  CEA  Tomorrow morning,   R        08/07/20 1616   08/08/20 0500  AFP tumor marker  Tomorrow morning,   R        08/07/20 1616   08/08/20 0500  Hepatitis  panel, acute  Tomorrow morning,   R        08/07/20 1616   08/08/20 0500  ANA, IFA (with reflex)  Tomorrow morning,   R        08/07/20 1616   08/08/20 0500  Mitochondrial antibodies  (Mitochondiral/Smooth Muscle AB PNL (PNL))  Tomorrow morning,   R        08/07/20 1616   08/08/20 0500  Anti-smooth muscle antibody, IgG  (Mitochondiral/Smooth Muscle AB PNL (PNL))  Tomorrow morning,   R        08/07/20 1616   08/06/20 0219  Osmolality, urine  Once,   STAT        08/06/20 0218   08/06/20 0218  Sodium, urine, random  Once,   STAT        08/06/20 0218            DVT prophylaxis: enoxaparin  (LOVENOX) injection 40 mg Start: 08/06/20 1000 SCDs Start: 08/05/20 2312   Code Status:full Family Communication: husband  Disposition:   Status is: Inpatient    Dispo: The patient is from: Home              Anticipated d/c is to: Home              Anticipated d/c date is: Tomorrow after paracentesis                Consultants:   GI Dr. Chales Abrahams over the phone  Procedures:   Paracentesis planned  Antimicrobials:   Anti-infectives (From admission, onward)   None          Objective: Vitals:   08/07/20 0018 08/07/20 0400 08/07/20 0809 08/07/20 1544  BP: (!) 146/86 130/85 128/84 (!) 137/93  Pulse: 85 91 93 97  Resp: 16 17 18 19   Temp: 98.5 F (36.9 C) 98.3 F (36.8 C) 98.4 F (36.9 C) (!) 97 F (36.1 C)  TempSrc: Oral Oral    SpO2: 100% 95% 98% 97%  Weight:      Height:        Intake/Output Summary (Last 24 hours) at 08/07/2020 1617 Last data filed at 08/07/2020 0900 Gross per 24 hour  Intake 240 ml  Output --  Net 240 ml   Filed Weights   08/06/20 0250  Weight: 77.1 kg    Examination:  General exam: calm, NAD Respiratory system: Clear to auscultation. Respiratory effort normal. Cardiovascular system: S1 & S2 heard, RRR. No JVD, no murmur,  Gastrointestinal system: protuberance Abdomen, tight, nontender.  Normal bowel sounds heard. Central nervous system: Alert and oriented. No focal neurological deficits. Extremities: trace pitting edema bilateral lower extremity, decrease sensation lower legs and feet Skin: No rashes, lesions or ulcers Psychiatry: Judgement and insight appear normal. Mood & affect appropriate.     Data Reviewed: I have personally reviewed following labs and imaging studies  CBC: Recent Labs  Lab 08/05/20 1209 08/06/20 0301 08/07/20 0309  WBC 10.0 8.6 8.0  NEUTROABS  --   --  5.4  HGB 11.0* 10.2* 9.9*  HCT 31.2* 29.0* 27.6*  MCV 94.3 95.1 95.5  PLT 176 136* 141*    Basic Metabolic Panel: Recent Labs  Lab  08/05/20 1209 08/05/20 1500 08/06/20 0301 08/07/20 0309  NA 126*  --  129* 135  K 4.4  --  4.4 4.5  CL 96*  --  99 105  CO2 20*  --  19* 22  GLUCOSE 120*  --  144* 91  BUN 9  --  8 6  CREATININE 1.01*  --  0.92 0.82  CALCIUM 8.1*  --  8.1* 8.4*  MG  --  1.1* 1.4* 1.6*  PHOS  --   --  2.8  --     GFR: Estimated Creatinine Clearance: 79.1 mL/min (by C-G formula based on SCr of 0.82 mg/dL).  Liver Function Tests: Recent Labs  Lab 08/05/20 1500 08/06/20 0301 08/07/20 0309  AST 79* 70* 63*  ALT 29 26 22   ALKPHOS 291* 263* 212*  BILITOT 1.9* 2.8* 2.0*  PROT 6.6 6.2* 5.7*  ALBUMIN 2.9* 2.7* 2.5*    CBG: Recent Labs  Lab 08/06/20 0748 08/06/20 1241 08/06/20 1932 08/07/20 0655 08/07/20 1143  GLUCAP 114* 170* 203* 80 154*     Recent Results (from the past 240 hour(s))  SARS CORONAVIRUS 2 (TAT 6-24 HRS) Nasopharyngeal Nasopharyngeal Swab     Status: None   Collection Time: 08/05/20 10:22 PM   Specimen: Nasopharyngeal Swab  Result Value Ref Range Status   SARS Coronavirus 2 NEGATIVE NEGATIVE Final    Comment: (NOTE) SARS-CoV-2 target nucleic acids are NOT DETECTED.  The SARS-CoV-2 RNA is generally detectable in upper and lower respiratory specimens during the acute phase of infection. Negative results do not preclude SARS-CoV-2 infection, do not rule out co-infections with other pathogens, and should not be used as the sole basis for treatment or other patient management decisions. Negative results must be combined with clinical observations, patient history, and epidemiological information. The expected result is Negative.  Fact Sheet for Patients: HairSlick.nohttps://www.fda.gov/media/138098/download  Fact Sheet for Healthcare Providers: quierodirigir.comhttps://www.fda.gov/media/138095/download  This test is not yet approved or cleared by the [place] FDA and  has been authorized for detection and/or diagnosis of SARS-CoV-2 by FDA under an Emergency Use Authorization (EUA).  This EUA will remain  in effect (meaning this test can be used) for the duration of the COVID-19 declaration under Se ction 564(b)(1) of the Act, 21 U.S.C. section 360bbb-3(b)(1), unless the authorization is terminated or revoked sooner.  Performed at [facility], [address] [place], [place] [address]          Radiology Studies: MR BRAIN WO CONTRAST  Result Date: 08/05/2020 CLINICAL DATA:  Multiple falls.  Dizziness.  Trauma to head. EXAM: MRI HEAD WITHOUT CONTRAST TECHNIQUE: Multiplanar, multiecho pulse sequences of the brain and surrounding structures were obtained without intravenous contrast. COMPARISON:  CT head without contrast 10/23/2015. MR head without and with contrast 05/18/2013 FINDINGS: Brain: No acute infarct, hemorrhage, or mass lesion is present. Mild atrophy and white matter changes are stable. The ventricles are of normal size. No significant extraaxial fluid collection is present. Vascular: Flow is present in the major intracranial arteries. Skull and upper cervical spine: The craniocervical junction is normal. Upper cervical spine is within normal limits. Marrow signal is unremarkable. Sinuses/Orbits: The paranasal sinuses and mastoid air cells are clear. The globes and orbits are within normal limits. IMPRESSION: 1. No acute intracranial abnormality or significant interval change. 2. Stable mild atrophy and white matter disease. This likely reflects the sequela of chronic microvascular ischemia. Electronically Signed   By: Marin Robertshristopher  Mattern M.D.   On: 08/05/2020 19:16   US Abdomen Limited  Result Date: 08/07/2020 CLINICAL DATA:  Abdominal distension.  Evaluate for ascites. EXAM: LIMITED ABDOMEN ULTRASOUND FOR ASCITES TECHNIQUE: Limited ultrasound survey for ascites was performed in all four abdominal quadrants. COMPARISON:  CT abdomen/pelvis 06/23/2019 FINDINGS: Four quadrants of the abdomen were evaluated demonstrating moderate ascites. IMPRESSION: Moderate  ascites. Electronically Signed   By: Elberta Fortis M.D.   On: 08/07/2020 15:47   VAS [place] LOWER EXTREMITY VENOUS (DVT)  Result Date: 08/07/2020  Lower Venous DVT Study Indications: Swelling.  Comparison Study: No prior study on file Performing Technologist: Sherren Kerns RVS  Examination Guidelines: A complete evaluation includes B-mode imaging, spectral Doppler, color Doppler, and power Doppler as needed of all accessible portions of each vessel. Bilateral testing is considered an integral part of a complete examination. Limited examinations for reoccurring indications may be performed as noted. The reflux portion of the exam is performed with the patient in reverse Trendelenburg.  +---------+---------------+---------+-----------+----------+--------------+ RIGHT    CompressibilityPhasicitySpontaneityPropertiesThrombus Aging +---------+---------------+---------+-----------+----------+--------------+ CFV      Full           Yes      Yes                                 +---------+---------------+---------+-----------+----------+--------------+ SFJ      Full                                                        +---------+---------------+---------+-----------+----------+--------------+ FV Prox  Full                                                        +---------+---------------+---------+-----------+----------+--------------+ FV Mid   Full                                                        +---------+---------------+---------+-----------+----------+--------------+ FV DistalFull                                                        +---------+---------------+---------+-----------+----------+--------------+ PFV      Full                                                        +---------+---------------+---------+-----------+----------+--------------+ POP      Full           Yes      Yes                                  +---------+---------------+---------+-----------+----------+--------------+ PTV      Full                                                        +---------+---------------+---------+-----------+----------+--------------+ PERO  Full                                                        +---------+---------------+---------+-----------+----------+--------------+ Soleal   Full                                                        +---------+---------------+---------+-----------+----------+--------------+   +---------+---------------+---------+-----------+----------+--------------+ LEFT     CompressibilityPhasicitySpontaneityPropertiesThrombus Aging +---------+---------------+---------+-----------+----------+--------------+ CFV      Full           Yes      Yes                                 +---------+---------------+---------+-----------+----------+--------------+ SFJ      Full                                                        +---------+---------------+---------+-----------+----------+--------------+ FV Prox  Full                                                        +---------+---------------+---------+-----------+----------+--------------+ FV Mid   Full                                                        +---------+---------------+---------+-----------+----------+--------------+ FV DistalFull                                                        +---------+---------------+---------+-----------+----------+--------------+ PFV      Full                                                        +---------+---------------+---------+-----------+----------+--------------+ POP      Full           Yes      Yes                                 +---------+---------------+---------+-----------+----------+--------------+ PTV      Full                                                         +---------+---------------+---------+-----------+----------+--------------+  PERO     Full                                                        +---------+---------------+---------+-----------+----------+--------------+     Summary: BILATERAL: - No evidence of deep vein thrombosis seen in the lower extremities, bilaterally. -   *See table(s) above for measurements and observations.    Preliminary         Scheduled Meds: . enoxaparin (LOVENOX) injection  40 mg Subcutaneous Q24H  . feeding supplement (GLUCERNA SHAKE)  237 mL Oral TID BM  . insulin aspart  0-5 Units Subcutaneous QHS  . insulin aspart  0-9 Units Subcutaneous TID WC  . losartan  100 mg Oral Daily  . meclizine  25 mg Oral BID  . metoprolol tartrate  50 mg Oral BID  . pantoprazole  40 mg Oral BID AC  . vitamin B-12  2,000 mcg Oral Daily   Continuous Infusions: . [START ON 08/08/2020] albumin human       LOS: 1 day   Time spent: Greater than 50% of this time was spent in counseling, explanation of diagnosis, planning of further management, and coordination of care.   Voice Recognition Reubin Milan dictation system was used to create this note, attempts have been made to correct errors. Please contact the author with questions and/or clarifications.   Albertine Grates, MD PhD FACP [facility]  Available via Epic secure chat 7am-7pm for nonurgent issues Please page for urgent issues To page the attending provider between 7A-7P or the covering provider during after hours 7P-7A, please log into the web site [email or web address] and access using universal [place] password for that web site. If you do not have the password, please call the hospital operator.    08/07/2020, 4:17 PM

## 2020-08-07 NOTE — Progress Notes (Signed)
VASCULAR LAB    Bilateral lower extremity venous duplex has been performed.  See CV proc for preliminary results.   Daelan Gatt, RVT 08/07/2020, 11:16 AM

## 2020-08-07 NOTE — Evaluation (Signed)
Occupational Therapy Evaluation Patient Details Name: Mallory Stewart MRN: [id number] DOB: 07-Aug-1960 Today's Date: 08/07/2020    History of Present Illness 60 y.o. female presenting to the ED due to 1 week onset of worsening unsteadiness, dizziness and fall within last 2 weeks with most recent fall being last night.  Patient endorsed history of chronic balance problems and lack of sensation in legs bilaterally below the knees. MRI negative. PT vestibular assessment showing "R vestibular hypofunction likely to due vestibular neuritis." PMH including HTN and GERD.   Clinical Impression   PTA, pt was living with her husband and was independent; working from home. Pt currently requiring Supervision for ADLs and functional mobility with use of RW. Pt reports she still feel unsteady on her feet. Providing education on RW management and height. Providing education for compensatory techniques to optimize balance and safety (such as figure four technique for LB dressing). Pt verbalizing and demonstrating understanding. Recommend dc to home once medically stable per physician. Answered all questions. All acute OT needs met and will sign off.     Follow Up Recommendations  No OT follow up    Equipment Recommendations  None recommended by OT    Recommendations for Other Services PT consult     Precautions / Restrictions Precautions Precautions: Fall      Mobility Bed Mobility Overal bed mobility: Modified Independent                  Transfers Overall transfer level: Needs assistance Equipment used: None;Rolling walker (2 wheeled) Transfers: Sit to/from Stand Sit to Stand: Supervision         General transfer comment: For safety    Balance Overall balance assessment: Needs assistance Sitting-balance support: Feet supported Sitting balance-Leahy Scale: Good     Standing balance support: No upper extremity supported Standing balance-Leahy Scale: Fair Standing balance  comment: Able to maintain static standing without UE support                           ADL either performed or assessed with clinical judgement   ADL Overall ADL's : Needs assistance/impaired                                       General ADL Comments: Pt performing ADLs and fucntional mobility at Supervision level. Reporting feeling unsteady on feet. Using RW for mobility. Providing education on comepnsatory techniques including figure four for LB dressing or using a cup for oral care to decreased forward head movement.     Vision         Perception     Praxis      Pertinent Vitals/Pain Pain Assessment: Faces Faces Pain Scale: No hurt Pain Intervention(s): Monitored during session     Hand Dominance Right   Extremity/Trunk Assessment Upper Extremity Assessment Upper Extremity Assessment: Overall WFL for tasks assessed   Lower Extremity Assessment Lower Extremity Assessment: Defer to PT evaluation RLE Deficits / Details: stiffness/soreness from falls, but moves antigravity and coordination generally [facility] RLE Sensation: decreased light touch;decreased proprioception (proprioception absent in great toe, better at ankle) LLE Deficits / Details: stiffness/soreness from falls, but moves antigravity and coordination generally WFL LLE Sensation: decreased light touch;decreased proprioception (proprioception absent in great toe, better at ankle)       Communication Communication Communication: No difficulties   Cognition Arousal/Alertness: Awake/alert Behavior During  Therapy: WFL for tasks assessed/performed Overall Cognitive Status: Within Functional Limits for tasks assessed                                     General Comments       Exercises     Shoulder Instructions      Home Living Family/patient expects to be discharged to:: Private residence Living Arrangements: Spouse/significant other Available Help at Discharge:  Family Type of Home: House Home Access: Level entry     Home Layout: Two level;Able to live on main level with bedroom/bathroom Alternate Level Stairs-Number of Steps: bonus room upstairs   Bathroom Shower/Tub: Occupational psychologist: Handicapped height Bathroom Accessibility: Yes   Home Equipment: Shower seat - built in   Additional Comments: home was made to be w/c accessible      Prior Functioning/Environment Level of Independence: Independent                 OT Problem List: Decreased activity tolerance;Impaired balance (sitting and/or standing);Decreased knowledge of use of DME or AE;Decreased knowledge of precautions      OT Treatment/Interventions:      OT Goals(Current goals can be found in the care plan section) Acute Rehab OT Goals Patient Stated Goal: to return to independent OT Goal Formulation: All assessment and education complete, DC therapy  OT Frequency:     Barriers to D/C:            Co-evaluation              AM-PAC OT "6 Clicks" Daily Activity     Outcome Measure Help from another person eating meals?: None Help from another person taking care of personal grooming?: None Help from another person toileting, which includes using toliet, bedpan, or urinal?: A Little Help from another person bathing (including washing, rinsing, drying)?: A Little Help from another person to put on and taking off regular upper body clothing?: None Help from another person to put on and taking off regular lower body clothing?: A Little 6 Click Score: 21   End of Session Equipment Utilized During Treatment: Rolling walker;Gait belt Nurse Communication: Mobility status  Activity Tolerance: Patient tolerated treatment well Patient left: in chair;with call bell/phone within reach  OT Visit Diagnosis: Unsteadiness on feet (R26.81);Other abnormalities of gait and mobility (R26.89);Muscle weakness (generalized) (M62.81)                Time:  1610-9604 OT Time Calculation (min): 25 min Charges:  OT General Charges $OT Visit: 1 Visit OT Evaluation $OT Eval Low Complexity: 1 Low OT Treatments $Self Care/Home Management : 8-22 mins  Trenyce Loera MSOT, OTR/L Acute Rehab Pager: [phone] Office: [place] 08/07/2020, 3:57 PM

## 2020-08-08 ENCOUNTER — Inpatient Hospital Stay (HOSPITAL_COMMUNITY): Payer: 59

## 2020-08-08 DIAGNOSIS — E440 Moderate protein-calorie malnutrition: Secondary | ICD-10-CM | POA: Insufficient documentation

## 2020-08-08 HISTORY — PX: IR PARACENTESIS: IMG2679

## 2020-08-08 LAB — ALBUMIN, PLEURAL OR PERITONEAL FLUID: Albumin, Fluid: 1.3 g/dL

## 2020-08-08 LAB — HEPATITIS PANEL, ACUTE
HCV Ab: NONREACTIVE
Hep A IgM: NONREACTIVE
Hep B C IgM: NONREACTIVE
Hepatitis B Surface Ag: NONREACTIVE

## 2020-08-08 LAB — BODY FLUID CELL COUNT WITH DIFFERENTIAL
Eos, Fluid: 0 %
Lymphs, Fluid: 53 %
Monocyte-Macrophage-Serous Fluid: 39 % — ABNORMAL LOW (ref 50–90)
Neutrophil Count, Fluid: 8 % (ref 0–25)
Total Nucleated Cell Count, Fluid: 182 cu mm (ref 0–1000)

## 2020-08-08 LAB — COMPREHENSIVE METABOLIC PANEL
ALT: 25 U/L (ref 0–44)
AST: 67 U/L — ABNORMAL HIGH (ref 15–41)
Albumin: 2.5 g/dL — ABNORMAL LOW (ref 3.5–5.0)
Alkaline Phosphatase: 234 U/L — ABNORMAL HIGH (ref 38–126)
Anion gap: 8 (ref 5–15)
BUN: <5 mg/dL — ABNORMAL LOW (ref 6–20)
CO2: 23 mmol/L (ref 22–32)
Calcium: 8.7 mg/dL — ABNORMAL LOW (ref 8.9–10.3)
Chloride: 101 mmol/L (ref 98–111)
Creatinine, Ser: 0.78 mg/dL (ref 0.44–1.00)
GFR, Estimated: >60 mL/min (ref >60)
Glucose, Bld: 169 mg/dL — ABNORMAL HIGH (ref 70–99)
Potassium: 4.2 mmol/L (ref 3.5–5.1)
Sodium: 132 mmol/L — ABNORMAL LOW (ref 135–145)
Total Bilirubin: 2 mg/dL — ABNORMAL HIGH (ref 0.3–1.2)
Total Protein: 6.3 g/dL — ABNORMAL LOW (ref 6.5–8.1)

## 2020-08-08 LAB — GLUCOSE, CAPILLARY
Glucose-Capillary: 163 mg/dL — ABNORMAL HIGH (ref 70–99)
Glucose-Capillary: 188 mg/dL — ABNORMAL HIGH (ref 70–99)

## 2020-08-08 LAB — PROTEIN, PLEURAL OR PERITONEAL FLUID: Total protein, fluid: <3 g/dL

## 2020-08-08 LAB — LACTATE DEHYDROGENASE, PLEURAL OR PERITONEAL FLUID: LD, Fluid: 56 U/L — ABNORMAL HIGH (ref 3–23)

## 2020-08-08 LAB — MAGNESIUM: Magnesium: 1.8 mg/dL (ref 1.7–2.4)

## 2020-08-08 MED ORDER — SPIRONOLACTONE 25 MG PO TABS: 100.0000 mg | ORAL_TABLET | Freq: Every day | ORAL | 0 refills | Status: DC

## 2020-08-08 MED ORDER — LIDOCAINE HCL (PF) 1 % IJ SOLN
INTRAMUSCULAR | Status: DC | PRN
  Administered 2020-08-08: 10 mL

## 2020-08-08 MED ORDER — GLUCERNA SHAKE PO LIQD: 237.0000 mL | Freq: Three times a day (TID) | ORAL | 0 refills | Status: DC

## 2020-08-08 MED ORDER — MAGNESIUM OXIDE 400 (241.3 MG) MG PO TABS: 400.0000 mg | ORAL_TABLET | Freq: Every day | ORAL | 0 refills | Status: DC

## 2020-08-08 MED ORDER — MECLIZINE HCL 25 MG PO TABS: 25.0000 mg | ORAL_TABLET | Freq: Two times a day (BID) | ORAL | 0 refills | Status: DC | PRN

## 2020-08-08 MED ORDER — FUROSEMIDE 40 MG PO TABS: 40.0000 mg | ORAL_TABLET | Freq: Every day | ORAL | 0 refills | Status: DC

## 2020-08-08 MED ORDER — ADULT MULTIVITAMIN W/MINERALS CH: 1.0000 | ORAL_TABLET | Freq: Every day | ORAL | Status: DC

## 2020-08-08 MED ORDER — ENSURE ENLIVE PO LIQD: 237.0000 mL | Freq: Three times a day (TID) | ORAL | Status: DC

## 2020-08-08 MED ORDER — THIAMINE HCL 100 MG PO TABS: 100.0000 mg | ORAL_TABLET | Freq: Every day | ORAL | 0 refills | Status: DC

## 2020-08-08 MED ORDER — LIDOCAINE HCL 1 % IJ SOLN
INTRAMUSCULAR | Status: AC
  Filled 2020-08-08: qty 20

## 2020-08-08 MED ORDER — MAGNESIUM OXIDE 400 (241.3 MG) MG PO TABS
400.0000 mg | ORAL_TABLET | Freq: Every day | ORAL | Status: DC
  Administered 2020-08-08: 400 mg via ORAL
  Filled 2020-08-08: qty 1

## 2020-08-08 NOTE — Progress Notes (Signed)
Initial Nutrition Assessment  DOCUMENTATION CODES:  Non-severe (moderate) malnutrition in context of acute illness/injury  INTERVENTION:  Discontinue Glucerna TID.  Add Ensure Enlive po TID, each supplement provides 350 kcal and 20 grams of protein.  Add Magic cup TID with meals, each supplement provides 290 kcal and 9 grams of protein.  Add MVI with minerals.  NUTRITION DIAGNOSIS:  Moderate Malnutrition related to acute illness (vertigo, dizziness, decreased appetite) as evidenced by moderate muscle depletion,mild muscle depletion,moderate fat depletion,mild fat depletion.  GOAL:  Patient will meet greater than or equal to 90% of their needs  MONITOR:  Supplement acceptance,PO intake,Labs  REASON FOR ASSESSMENT:  Consult Assessment of nutrition requirement/status  ASSESSMENT:  60 yo female with a PMH of HTN, T1DM, Vitamin B12 deficiency, and GERD who presents with generalized weakness and fall. 4/4 - paracentesis (pt reports 9 lb wt loss from this)  Spoke with pt at bedside. Pt in very pleasant spirits, excited to go home. She reports that today is the first day in a few months that she feels like she has an appetite and is excited to go home and eat. Pt does not eat gluten, so she has had difficulty choosing options for herself. Her husband has been bringing foods from home (PB&J) that she can gave. She reports not having much of an appetite within the last couple of months.  She also denies any weight loss that was not fluid. She reports losing 9 lbs during her paracentesis today, but other than that, no weight changes. On exam, pt slightly edematous in legs on exam. Pt had some depletions in upper arms and on face.  Recommend removing carb modification from diet order as pt's A1c is only slightly above normal. MD disagrees, consider again if PO intake is still poor. Also recommend removing Glucerna as pt does not like it very much, but is willing to drink it. Also recommend MVI  with minerals.  Pt reports discharge today. Encouraged her to increase protein and calories for a while after discharge so that she can start to build back her muscle and fat stores.  Relevant Medications: Lasix, SSI, Mag-Ox, Protonix, Senokot, spironolactone, vitamin B12 tablet 2000 mcg Labs: reviewed; Na 132, CBG 163-177 HbA1c: 5.8% (06/2020)  NUTRITION - FOCUSED PHYSICAL EXAM: Flowsheet Row Most Recent Value  Orbital Region Mild depletion  Upper Arm Region Moderate depletion  Thoracic and Lumbar Region No depletion  Buccal Region Mild depletion  Temple Region Mild depletion  Clavicle Bone Region No depletion  Clavicle and Acromion Bone Region No depletion  Scapular Bone Region No depletion  Dorsal Hand No depletion  Patellar Region Mild depletion  Anterior Thigh Region Mild depletion  Posterior Calf Region Mild depletion  Edema (RD Assessment) Moderate  Hair Reviewed  Eyes Reviewed  Mouth Reviewed  Skin Reviewed  Nails Reviewed     Diet Order:   Diet Order            Diet - low sodium heart healthy           Diet heart healthy/carb modified Room service appropriate? Yes; Fluid consistency: Thin  Diet effective now                EDUCATION NEEDS:  Education needs have been addressed  Skin:  Skin Assessment: Reviewed RN Assessment  Last BM:  08/06/20  Height:  Ht Readings from Last 1 Encounters:  08/06/20 5\' 9"  (1.753 m)   Weight:  Wt Readings from Last 1 Encounters:  08/06/20  77.1 kg   Ideal Body Weight:  66 kg  BMI:  Body mass index is 25.1 kg/m.  Estimated Nutritional Needs:  Kcal:  1650-1850 Protein:  75-90 grams Fluid:  >1.65  Vertell Limber, RD, LDN Registered Dietitian After Hours/Weekend Pager # in Amion

## 2020-08-08 NOTE — Discharge Summary (Signed)
Discharge Summary  Mallory Stewart QQI:[id number] DOB: Aug 25, 1960  PCP: [name], MD  Admit date: 08/05/2020 Discharge date: 08/08/2020  Time spent: 9mins, more than 50% time spent on coordination of care.   Recommendations for Outpatient Follow-up:  1. F/u with PCP within a week  for hospital discharge follow up 2. F/u with GI Dr. Carlean Purl in 1 to 2 weeks 3. Vestibular rehab referral placed 4. Walker ordered at discharge  Please follow-up the following lab test result:    repeat cbc/cmp/mag at follow up  Unresulted Labs (From admission, onward)          Start     Ordered   08/08/20 0915  Gram stain  RELEASE UPON ORDERING,   TIMED        08/08/20 0915   08/08/20 0915  PH, Body Fluid  RELEASE UPON ORDERING,   TIMED        08/08/20 0915   08/08/20 0914  Gram stain  Once,   R        08/08/20 0914   08/08/20 0914  Culture, body fluid w Gram Stain-bottle  Once,   R        08/08/20 0914   08/08/20 0500  CEA  Tomorrow morning,   R        08/07/20 1616   08/08/20 0500  AFP tumor marker  Tomorrow morning,   R        08/07/20 1616   08/08/20 0500  ANA, IFA (with reflex)  Tomorrow morning,   R        08/07/20 1616   08/08/20 0500  Mitochondrial antibodies  (Mitochondiral/Smooth Muscle AB PNL (PNL))  Tomorrow morning,   R        08/07/20 1616   08/08/20 0500  Anti-smooth muscle antibody, IgG  (Mitochondiral/Smooth Muscle AB PNL (PNL))  Tomorrow morning,   R        08/07/20 1616   Unscheduled  Occult blood card to lab, stool RN will collect  As needed,   R     Question:  Specimen to be collected by:  Answer:  RN will collect   08/07/20 1636          Discharge Diagnoses:  Active Hospital Problems   Diagnosis Date Noted  . Generalized weakness 08/05/2020  . Malnutrition of moderate degree 08/08/2020  . Hyponatremia 08/06/2020  . Hypomagnesemia 08/06/2020  . Hypoalbuminemia 08/06/2020  . Transaminitis 08/06/2020  . Serum total bilirubin elevated 08/06/2020  . Fall at  home, initial encounter 08/06/2020  . Dizziness 12/26/2017    Resolved Hospital Problems  No resolved problems to display.    Discharge Condition: stable  Diet recommendation: Low-sodium/carb modified  Filed Weights   08/06/20 0250  Weight: 77.1 kg    History of present illness:  Chief Complaint: Dizziness, fall  HPI: Mallory Stewart is a 60 y.o. female with medical history significant for, hypertension, GERD who presents to the emergency department due to 1 week onset of worsening unsteadiness, dizziness and fall within last 2 weeks with most recent fall being last night.  Patient thought that this may be related to fluid buildup in the ears.  Patient endorsed history of chronic balance problems and lack of sensation in legs bilaterally below the knees due to postconcussive disorder, this has worsened since onset of symptoms.  She denies loss of consciousness with the falls.  Dizziness worsens with change of position. ED Course:  In the emergency department, she was intermittently  tachycardic, BP was 129/93, other vital signs are within normal range.  Work-up in the ED showed hypoalbuminemia, elevated liver enzymes, elevated total bilirubin, hypomagnesemia, hyponatremia, normocytic anemia.  Urinalysis was unimpressive for UTI. MRI of brain without contrast showed no acute intracranial abnormality or significant interval change. IV hydration was provided, magnesium was replenished.  Hospitalist was asked to admit patient for further evaluation and management.  Hospital Course:  Principal Problem:   Generalized weakness Active Problems:   Dizziness   Hyponatremia   Hypomagnesemia   Hypoalbuminemia   Transaminitis   Serum total bilirubin elevated   Fall at home, initial encounter   Malnutrition of moderate degree   Hyponatremia -Sodium 126 on presentation, sodium 132 at discharge -She reported poor oral intake last month , she also likely have cirrhosis she also likely have  cirrhosis  Hypomagnesemia Mag 1.1 on presentation, she received aggressive iv mag supplement, mag improved to 1.8, she is discharged on magnesium supplement daily   Abdominal distention /ascites - abdominal ultrasound  showed ascites  -She has abnormal LFTs AST elevated 60-79/tbili 2.8/abnormal INR 1.7/mild thrombocytopenia plt 136-141 -CT abdomen obtained a year ago showed severe hepatic steatosis and mild enlargement of spleen -suspect early cirrhosis -she reports abdomen is being tight, denies pain,  -she underwent diagnostic and therapeutic paracentesis with 4liter fluids removed, fluids study plus cytology ordered, serum albumin level  2.5, iv albumin 12.5 x1 given with paracentesis, she felt better after paracentesis -also ordered autoimmune hepatitis panel and AFP/CEA -Restarted on spironolactone 100 mg/Lasix 40 mg daily -case discussed with GI Dr Lyndel Safe who agrees with currently plan and recommended outpatient GI follow up in one week. -Staff message sent to Dr. Carlean Purl    Bilateral lower extremity pitting edema Left > right Venous doppler no DVT Elevate legs Restarted on spironolactone 100 mg/Lasix 40 mg daily  Normocytic anemia -No overt sign of external bleed -Hemoglobin 9.9 -Follow-up with PCP and GI -Likely anemia of chronic disease  Hypertension , stable ,continue Lopressor and Cozaar Restarted on Lasix and spironolactone, she is instructed to check blood pressure at home, follow-up with PCP  Insulin-dependent type 2 diabetes Continue home meds Wilder Glade and Toujeo  Recent A1c 5.8 in February 2022 A.m. blood glucose 91 -169 in the hospital F/u with pcp  Dizziness/vertigo Does has fluid seen right ear PT recommended outpatient vestibular rehab Prescribed meclizine as needed Much improved at discharge  Failure to thrive; PT recommended rolling walker   Body mass index is 25.1 kg/m.Marland Kitchen  Nutrition Status: Nutrition Problem: Moderate  Malnutrition Etiology: acute illness (vertigo, dizziness, decreased appetite) Signs/Symptoms: moderate muscle depletion,mild muscle depletion,moderate fat depletion,mild fat depletion Interventions: MVI,Magic cup,Ensure Enlive (each supplement provides 350kcal and 20 grams of protein)     Procedures:  Paracentesis  Consultations:  GI Dr. Lyndel Safe over the phone  Discharge Exam: BP (!) 147/98 (BP Location: Left Arm)   Pulse 96   Temp 98.6 F (37 C) (Oral)   Resp 17   Ht $R'5\' 9"'aZ$  (1.753 m)   Wt 77.1 kg   SpO2 99%   BMI 25.10 kg/m   General: NAD, abdomen distention is much improved at discharge, lower extremity edema much improved at discharge Cardiovascular: Regular rate rhythm Respiratory: Normal respiratory effort  Discharge Instructions You were cared for by a hospitalist during your hospital stay. If you have any questions about your discharge medications or the care you received while you were in the hospital after you are discharged, you can call the unit and asked  to speak with the hospitalist on call if the hospitalist that took care of you is not available. Once you are discharged, your primary care physician will handle any further medical issues. Please note that NO REFILLS for any discharge medications will be authorized once you are discharged, as it is imperative that you return to your primary care physician (or establish a relationship with a primary care physician if you do not have one) for your aftercare needs so that they can reassess your need for medications and monitor your lab values.  Discharge Instructions    Ambulatory referral to Physical Therapy   Complete by: As directed    Vestibular rehab   Diet - low sodium heart healthy   Complete by: As directed    Carb modified diet Please avoid alcohol   Increase activity slowly   Complete by: As directed      Allergies as of 08/08/2020      Reactions   Gluten Meal Other (See Comments)   Stomach distress    Zocor [simvastatin] Other (See Comments)   Elevated lfts?      Medication List    TAKE these medications   ALPRAZolam 0.25 MG tablet Commonly known as: XANAX Take 1 tablet (0.25 mg total) by mouth daily as needed for anxiety.   amitriptyline 50 MG tablet Commonly known as: ELAVIL Take 1 tablet (50 mg total) by mouth at bedtime.   azelastine 0.05 % ophthalmic solution Commonly known as: OPTIVAR Place 1 drop into both eyes 2 (two) times daily. What changed:   when to take this  reasons to take this   azelastine 0.1 % nasal spray Commonly known as: ASTELIN Place 2 sprays into both nostrils 2 (two) times daily. Use in each nostril as directed   B-12 1000 MCG Tbcr Take 2,000 mcg by mouth daily.   B-D UF III MINI PEN NEEDLES 31G X 5 MM Misc Generic drug: Insulin Pen Needle USE 4 TIMES A DAY AS DIRECTED   cetirizine 10 MG tablet Commonly known as: ZYRTEC Take 10 mg by mouth at bedtime.   Farxiga 10 MG Tabs tablet Generic drug: dapagliflozin propanediol Take 10 mg by mouth daily before breakfast.   feeding supplement (GLUCERNA SHAKE) Liqd Take 237 mLs by mouth 3 (three) times daily between meals.   furosemide 40 MG tablet Commonly known as: LASIX Take 1 tablet (40 mg total) by mouth daily. Start taking on: August 09, 2020   losartan 100 MG tablet Commonly known as: COZAAR Take 1 tablet (100 mg total) by mouth daily.   magnesium oxide 400 (241.3 Mg) MG tablet Commonly known as: MAG-OX Take 1 tablet (400 mg total) by mouth daily. Start taking on: August 09, 2020   meclizine 25 MG tablet Commonly known as: ANTIVERT Take 1 tablet (25 mg total) by mouth 2 (two) times daily as needed for dizziness.   methocarbamol 500 MG tablet Commonly known as: ROBAXIN Take 1 tablet (500 mg total) by mouth every 8 (eight) hours as needed for muscle spasms.   metoprolol tartrate 50 MG tablet Commonly known as: LOPRESSOR Take 1 tablet (50 mg total) by mouth 2 (two) times daily.    multivitamin with minerals Tabs tablet Take 1 tablet by mouth daily.   ONE TOUCH ULTRA 2 w/Device Kit Use to check blood sugars two times day   OneTouch Ultra test strip Generic drug: glucose blood CHECK BLOOD SUGAR TWO TIMES DAILY AS DIRECTED   onetouch ultrasoft lancets Use to check blood  sugar two times a day.   pantoprazole 40 MG tablet Commonly known as: PROTONIX TAKE 1 TABLET BY MOUTH TWICE DAILY BEFORE A MEAL   spironolactone 25 MG tablet Commonly known as: ALDACTONE Take 4 tablets (100 mg total) by mouth daily. Start taking on: August 09, 2020   thiamine 100 MG tablet Take 1 tablet (100 mg total) by mouth daily.   Toujeo Max SoloStar 300 UNIT/ML Solostar Pen Generic drug: insulin glargine (2 Unit Dial) Inject 34 Units into the skin daily at 2 PM. What changed: how much to take            Durable Medical Equipment  (From admission, onward)         Start     Ordered   08/06/20 2120  For home use only DME Walker rolling  Once       Question Answer Comment  Walker: With [employer]er Wheels   Patient needs a walker to treat with the following condition FTT (failure to thrive) in adult      08/06/20 2119         Allergies  Allergen Reactions  . Gluten Meal Other (See Comments)    Stomach distress  . Zocor [Simvastatin] Other (See Comments)    Elevated lfts?    Follow-up Information    Bedsole, Amy E, MD Follow up in 1 week(s).   Specialty: Family Medicine Why: hospital discharge follow up, repeat basic labs to monitor anemia/liver/kidney function/sodium and magnesium level please check your blood pressure at home twice a day, bring record to your pcp, your blood pressure medication may need to be adjusted Contact information: [place] [address] [phone]        Gatha Mayer, MD Follow up in 2 week(s).   Specialty: Gastroenterology Why: ascites ,abnormal liver function Contact information: [address] [place] [address] [phone]        [facility] Follow up.   Specialty: Rehabilitation Why: office will call and arrange outpatient vestibular PT appointment Contact information: [address] [place] [id number] [place] [phone]               The results of significant diagnostics from this hospitalization (including imaging, microbiology, ancillary and laboratory) are listed below for reference.    Significant Diagnostic Studies: MR BRAIN WO CONTRAST  Result Date: 08/05/2020 CLINICAL DATA:  Multiple falls.  Dizziness.  Trauma to head. EXAM: MRI HEAD WITHOUT CONTRAST TECHNIQUE: Multiplanar, multiecho pulse sequences of the brain and surrounding structures were obtained without intravenous contrast. COMPARISON:  CT head without contrast 10/23/2015. MR head without and with contrast 05/18/2013 FINDINGS: Brain: No acute infarct, hemorrhage, or mass lesion is present. Mild atrophy and white matter changes are stable. The ventricles are of normal size. No significant extraaxial fluid collection is present. Vascular: Flow is present in the major intracranial arteries. Skull and upper cervical spine: The craniocervical junction is normal. Upper cervical spine is within normal limits. Marrow signal is unremarkable. Sinuses/Orbits: The paranasal sinuses and mastoid air cells are clear. The globes and orbits are within normal limits. IMPRESSION: 1. No acute intracranial abnormality or significant interval change. 2. Stable mild atrophy and white matter disease. This likely reflects the sequela of chronic microvascular ischemia. Electronically Signed   By: San Morelle M.D.   On: 08/05/2020 19:16   US Abdomen Limited  Result Date: 08/07/2020 CLINICAL DATA:  Abdominal distension.  Evaluate for ascites. EXAM:  LIMITED ABDOMEN ULTRASOUND FOR ASCITES TECHNIQUE: Limited ultrasound survey for ascites was  performed in all four abdominal quadrants. COMPARISON:  CT abdomen/pelvis 06/23/2019 FINDINGS: Four quadrants of the abdomen were evaluated demonstrating moderate ascites. IMPRESSION: Moderate ascites. Electronically Signed   By: Marin Olp M.D.   On: 08/07/2020 15:47   VAS [place] LOWER EXTREMITY VENOUS (DVT)  Result Date: 08/07/2020  Lower Venous DVT Study Indications: Swelling.  Comparison Study: No prior study on file Performing Technologist: Sharion Dove RVS  Examination Guidelines: A complete evaluation includes B-mode imaging, spectral Doppler, color Doppler, and power Doppler as needed of all accessible portions of each vessel. Bilateral testing is considered an integral part of a complete examination. Limited examinations for reoccurring indications may be performed as noted. The reflux portion of the exam is performed with the patient in reverse Trendelenburg.  +---------+---------------+---------+-----------+----------+--------------+ RIGHT    CompressibilityPhasicitySpontaneityPropertiesThrombus Aging +---------+---------------+---------+-----------+----------+--------------+ CFV      Full           Yes      Yes                                 +---------+---------------+---------+-----------+----------+--------------+ SFJ      Full                                                        +---------+---------------+---------+-----------+----------+--------------+ FV Prox  Full                                                        +---------+---------------+---------+-----------+----------+--------------+ FV Mid   Full                                                        +---------+---------------+---------+-----------+----------+--------------+ FV DistalFull                                                        +---------+---------------+---------+-----------+----------+--------------+ PFV      Full                                                         +---------+---------------+---------+-----------+----------+--------------+ POP      Full           Yes      Yes                                 +---------+---------------+---------+-----------+----------+--------------+ PTV      Full                                                        +---------+---------------+---------+-----------+----------+--------------+  PERO     Full                                                        +---------+---------------+---------+-----------+----------+--------------+ Soleal   Full                                                        +---------+---------------+---------+-----------+----------+--------------+   +---------+---------------+---------+-----------+----------+--------------+ LEFT     CompressibilityPhasicitySpontaneityPropertiesThrombus Aging +---------+---------------+---------+-----------+----------+--------------+ CFV      Full           Yes      Yes                                 +---------+---------------+---------+-----------+----------+--------------+ SFJ      Full                                                        +---------+---------------+---------+-----------+----------+--------------+ FV Prox  Full                                                        +---------+---------------+---------+-----------+----------+--------------+ FV Mid   Full                                                        +---------+---------------+---------+-----------+----------+--------------+ FV DistalFull                                                        +---------+---------------+---------+-----------+----------+--------------+ PFV      Full                                                        +---------+---------------+---------+-----------+----------+--------------+ POP      Full           Yes      Yes                                  +---------+---------------+---------+-----------+----------+--------------+ PTV      Full                                                        +---------+---------------+---------+-----------+----------+--------------+  PERO     Full                                                        +---------+---------------+---------+-----------+----------+--------------+     Summary: BILATERAL: - No evidence of deep vein thrombosis seen in the lower extremities, bilaterally. -   *See table(s) above for measurements and observations. Electronically signed by Sherald Hess MD on 08/07/2020 at 4:21:35 PM.    Final    IR Paracentesis  Result Date: 08/08/2020 INDICATION: Patient with history of abdominal distension, found to have new onset ascites. Request is made for diagnostic and therapeutic paracentesis. EXAM: ULTRASOUND GUIDED DIAGNOSTIC AND THERAPEUTIC PARACENTESIS MEDICATIONS: 10 mL 1% lidocaine COMPLICATIONS: None immediate. PROCEDURE: Informed written consent was obtained from the patient after a discussion of the risks, benefits and alternatives to treatment. A timeout was performed prior to the initiation of the procedure. Initial ultrasound scanning demonstrates a large amount of ascites within the right lower abdominal quadrant. The right lower abdomen was prepped and draped in the usual sterile fashion. 1% lidocaine was used for local anesthesia. Following this, a 19 gauge, 7-cm, Yueh catheter was introduced. An ultrasound image was saved for documentation purposes. The paracentesis was performed. The catheter was removed and a dressing was applied. The patient tolerated the procedure well without immediate post procedural complication. FINDINGS: A total of approximately 4.0 liters of yellow fluid was removed. Samples were sent to the laboratory as requested by the clinical team. IMPRESSION: Successful ultrasound-guided paracentesis yielding 4.0 liters of peritoneal fluid. Read by: Loyce Dys  PA-C Electronically Signed   By: Irish Lack M.D.   On: 08/08/2020 13:27    Microbiology: Recent Results (from the past 240 hour(s))  SARS CORONAVIRUS 2 (TAT 6-24 HRS) Nasopharyngeal Nasopharyngeal Swab     Status: None   Collection Time: 08/05/20 10:22 PM   Specimen: Nasopharyngeal Swab  Result Value Ref Range Status   SARS Coronavirus 2 NEGATIVE NEGATIVE Final    Comment: (NOTE) SARS-CoV-2 target nucleic acids are NOT DETECTED.  The SARS-CoV-2 RNA is generally detectable in upper and lower respiratory specimens during the acute phase of infection. Negative results do not preclude SARS-CoV-2 infection, do not rule out co-infections with other pathogens, and should not be used as the sole basis for treatment or other patient management decisions. Negative results must be combined with clinical observations, patient history, and epidemiological information. The expected result is Negative.  Fact Sheet for Patients: HairSlick.no  Fact Sheet for Healthcare Providers: quierodirigir.com  This test is not yet approved or cleared by the [place] FDA and  has been authorized for detection and/or diagnosis of SARS-CoV-2 by FDA under an Emergency Use Authorization (EUA). This EUA will remain  in effect (meaning this test can be used) for the duration of the COVID-19 declaration under Se ction 564(b)(1) of the Act, 21 U.S.C. section 360bbb-3(b)(1), unless the authorization is terminated or revoked sooner.  Performed at [facility], [address] [place], [place] [address]      Labs: Basic Metabolic Panel: Recent Labs  Lab 08/05/20 1209 08/05/20 1500 08/06/20 0301 08/07/20 0309 08/08/20 0446  NA 126*  --  129* 135 132*  K 4.4  --  4.4 4.5 4.2  CL 96*  --  99 105 101  CO2 20*  --  19* 22 23  GLUCOSE 120*  --  144* 91 169*  BUN 9  --  8 6 <5*  CREATININE 1.01*  --  0.92 0.82 0.78  CALCIUM 8.1*  --  8.1*  8.4* 8.7*  MG  --  1.1* 1.4* 1.6* 1.8  PHOS  --   --  2.8  --   --    Liver Function Tests: Recent Labs  Lab 08/05/20 1500 08/06/20 0301 08/07/20 0309 08/08/20 0446  AST 79* 70* 63* 67*  ALT $Re'29 26 22 25  'YjV$ ALKPHOS 291* 263* 212* 234*  BILITOT 1.9* 2.8* 2.0* 2.0*  PROT 6.6 6.2* 5.7* 6.3*  ALBUMIN 2.9* 2.7* 2.5* 2.5*   No results for input(s): LIPASE, AMYLASE in the last 168 hours. No results for input(s): AMMONIA in the last 168 hours. CBC: Recent Labs  Lab 08/05/20 1209 08/06/20 0301 08/07/20 0309  WBC 10.0 8.6 8.0  NEUTROABS  --   --  5.4  HGB 11.0* 10.2* 9.9*  HCT 31.2* 29.0* 27.6*  MCV 94.3 95.1 95.5  PLT 176 136* 141*   Cardiac Enzymes: No results for input(s): CKTOTAL, CKMB, CKMBINDEX, TROPONINI in the last 168 hours. BNP: BNP (last 3 results) No results for input(s): BNP in the last 8760 hours.  ProBNP (last 3 results) No results for input(s): PROBNP in the last 8760 hours.  CBG: Recent Labs  Lab 08/07/20 1143 08/07/20 1700 08/07/20 1951 08/08/20 0653 08/08/20 1143  GLUCAP 154* 171* 177* 163* 188*       Signed:  Florencia Reasons MD, PhD, FACP  [facility] 08/08/2020, 2:54 PM

## 2020-08-08 NOTE — Progress Notes (Signed)
Discharge instructions addressed; Pt in stable condition ;Pt.'s husband in room and will be pt.'s ride home.DME rolling walker delivered in room.

## 2020-08-08 NOTE — Plan of Care (Signed)
  Problem: Activity: Goal: Risk for activity intolerance will decrease Outcome: Progressing   Problem: Coping: Goal: Level of anxiety will decrease Outcome: Progressing   Problem: Pain Managment: Goal: General experience of comfort will improve Outcome: Progressing   Problem: Safety: Goal: Ability to remain free from injury will improve Outcome: Progressing   Problem: Skin Integrity: Goal: Risk for impaired skin integrity will decrease Outcome: Progressing   

## 2020-08-08 NOTE — Progress Notes (Signed)
Physical Therapy Treatment Patient Details Name: Mallory Stewart MRN: [id number] DOB: 25-Jun-1960 Today's Date: 08/08/2020    History of Present Illness 60 y.o. female presenting to the ED due to 1 week onset of worsening unsteadiness, dizziness and fall within last 2 weeks with most recent fall being last night.  Patient endorsed history of chronic balance problems and lack of sensation in legs bilaterally below the knees. MRI negative. PT vestibular assessment showing "R vestibular hypofunction likely to due vestibular neuritis." PMH including HTN and GERD.    PT Comments    Pt with improved dizziness at rest and is able to roll, transfer to EOB, and into standing without dizziness with use of gaze stabilization. trialed ambulation without AD, pt steady while using gaze stabilization however pt with LOBx4 while turning head in all directions requiring minA to prevent fall. Pt for planned thoracentesis today, transport came to take pt prior to being able to complete VORx1 exercises. Pt with good recall and reports she will do them later today. Acute PT to cont to follow.   Follow Up Recommendations  Outpatient PT-vestibular     Equipment Recommendations  Rolling walker with 5" wheels    Recommendations for Other Services       Precautions / Restrictions Precautions Precautions: Fall Precaution Comments: pt going for thorencentisis today Restrictions Weight Bearing Restrictions: No    Mobility  Bed Mobility Overal bed mobility: Modified Independent             General bed mobility comments: educated on gaze stabilization technique while rolling and coming to sit, worked with bed flat without use of bed rail to mimic home set up    Transfers Overall transfer level: Needs assistance Equipment used: None Transfers: Sit to/from Stand Sit to Stand: Min guard         General transfer comment: slow as a precaution, no physical assist  needed  Ambulation/Gait Ambulation/Gait assistance: Min guard Gait Distance (Feet): 550 Feet Assistive device: Rolling walker (2 wheeled);None Gait Pattern/deviations: Step-through pattern;Decreased stride length Gait velocity: guarded, cautionsly slow Gait velocity interpretation: 1.31 - 2.62 ft/sec, indicative of limited community ambulator General Gait Details: pt able to amb without AD while looking a couple steps ahead, when asked to turn head L/R and up/down pt with LOB all 4 times requiring minA, turning head to the L and looking straight down were the worst per patient report, pt did better when she had walker to hold only but still felt onset of spinning   Stairs             Wheelchair Mobility    Modified Rankin (Stroke Patients Only)       Balance Overall balance assessment: Needs assistance Sitting-balance support: Feet supported Sitting balance-Leahy Scale: Good     Standing balance support: No upper extremity supported Standing balance-Leahy Scale: Fair Standing balance comment: Able to maintain static standing without UE support to brush teeth and brush hair                            Cognition Arousal/Alertness: Awake/alert Behavior During Therapy: WFL for tasks assessed/performed Overall Cognitive Status: Within Functional Limits for tasks assessed                                        Exercises      General Comments General  comments (skin integrity, edema, etc.): VSS, went over VORx1 exercises, assisted to bathroom, pt indep with tolieting and hygiene, able to brush teeth without assist      Pertinent Vitals/Pain Pain Assessment: No/denies pain    Home Living                      Prior Function            PT Goals (current goals can now be found in the care plan section) Progress towards PT goals: Progressing toward goals    Frequency    Min 3X/week      PT Plan Current plan remains  appropriate    Co-evaluation              AM-PAC PT "6 Clicks" Mobility   Outcome Measure  Help needed turning from your back to your side while in a flat bed without using bedrails?: None Help needed moving from lying on your back to sitting on the side of a flat bed without using bedrails?: None Help needed moving to and from a bed to a chair (including a wheelchair)?: A Little Help needed standing up from a chair using your arms (e.g., wheelchair or bedside chair)?: A Little Help needed to walk in hospital room?: A Little Help needed climbing 3-5 steps with a railing? : A Little 6 Click Score: 20    End of Session Equipment Utilized During Treatment: Gait belt Activity Tolerance: Patient tolerated treatment well Patient left: in bed;with call bell/phone within reach (leaving for IR with transporter)   PT Visit Diagnosis: Other abnormalities of gait and mobility (R26.89);Dizziness and giddiness (R42)     Time: 2595-6387 PT Time Calculation (min) (ACUTE ONLY): 30 min  Charges:  $Gait Training: 8-22 mins $Therapeutic Activity: 8-22 mins                     Lewis Shock, PT, DPT Acute Rehabilitation Services Pager #: [phone] Office #: [phone]    Iona Hansen 08/08/2020, 8:33 AM

## 2020-08-08 NOTE — Procedures (Signed)
PROCEDURE SUMMARY:  Successful US guided paracentesis from right lateral abdomen.  Yielded 4.0 liters of yellow fluid.  No immediate complications.  Pt tolerated well.   Specimen was sent for labs.  EBL < 60mL  Hoyt Koch PA-C 08/08/2020 1:26 PM

## 2020-08-09 ENCOUNTER — Encounter: Payer: Self-pay | Admitting: Family Medicine

## 2020-08-09 ENCOUNTER — Telehealth: Payer: Self-pay

## 2020-08-09 DIAGNOSIS — R161 Splenomegaly, not elsewhere classified: Secondary | ICD-10-CM | POA: Insufficient documentation

## 2020-08-09 LAB — PH, BODY FLUID: pH, Body Fluid: 7.6

## 2020-08-09 LAB — ANTI-SMOOTH MUSCLE ANTIBODY, IGG: F-Actin IgG: 9 Units (ref 0–19)

## 2020-08-09 LAB — GRAM STAIN

## 2020-08-09 LAB — MITOCHONDRIAL ANTIBODIES: Mitochondrial M2 Ab, IgG: <20 Units (ref 0.0–20.0)

## 2020-08-09 LAB — CEA: CEA: 8.5 ng/mL — ABNORMAL HIGH (ref 0.0–4.7)

## 2020-08-09 LAB — AFP TUMOR MARKER: AFP, Serum, Tumor Marker: 2.3 ng/mL (ref 0.0–9.2)

## 2020-08-09 LAB — CYTOLOGY - NON PAP

## 2020-08-09 LAB — ANTINUCLEAR ANTIBODIES, IFA: ANA Ab, IFA: NEGATIVE

## 2020-08-09 NOTE — Telephone Encounter (Signed)
Transition Care Management Unsuccessful Follow-up Telephone Call  Date of discharge and from where:  08/08/2020, [facility]   Attempts:  1st Attempt  Reason for unsuccessful TCM follow-up call:  Left voice message

## 2020-08-09 NOTE — Telephone Encounter (Signed)
Transition Care Management Follow-up Telephone Call  Date of discharge and from where: 08/08/2020, [facility]  How have you been since you were released from the hospital? Patient is doing much better now.  Any questions or concerns? No  Items Reviewed:  Did the pt receive and understand the discharge instructions provided? Yes   Medications obtained and verified? Yes   Other? No   Any new allergies since your discharge? No   Dietary orders reviewed? Yes  Do you have support at home? Yes   Home Care and Equipment/Supplies: Were home health services ordered? not applicable If so, what is the name of the agency? N/A  Has the agency set up a time to come to the patient's home? not applicable Were any new equipment or medical supplies ordered?  No What is the name of the medical supply agency? N/A Were you able to get the supplies/equipment? not applicable Do you have any questions related to the use of the equipment or supplies? No  Functional Questionnaire: (I = Independent and D = Dependent) ADLs: I  Bathing/Dressing- I  Meal Prep- I  Eating- I  Maintaining continence- I  Transferring/Ambulation- I  Managing Meds- I  Follow up appointments reviewed:   PCP Hospital f/u appt confirmed? Yes  Scheduled to see Dr. Ermalene Searing on 08/18/2020 @ 10:20 am.  Specialist Hospital f/u appt confirmed? follow up with Gastroenterology   Are transportation arrangements needed? No   If their condition worsens, is the pt aware to call PCP or go to the Emergency Dept.? Yes  Was the patient provided with contact information for the PCP's office or ED? Yes  Was to pt encouraged to call back with questions or concerns? Yes

## 2020-08-10 ENCOUNTER — Telehealth: Payer: Self-pay

## 2020-08-10 DIAGNOSIS — R188 Other ascites: Secondary | ICD-10-CM

## 2020-08-10 NOTE — Telephone Encounter (Signed)
Iva Boop, MD sent to Annett Fabian, RN Lavonna Rua,   Lets have the patient come Monday next week for a CBC and CMET.   I also want her to do a CT abdomen and pelvis with contrast diagnosis ascites   She has an appointment with me in May but lets get her an app appointment for the week of around April 20 it looks like there are openings I do not have any where I could see her    Once I review the other labs I will reach out to the patient about her studies in the hospital I have looked at them some already and nothing suspicious there. If she has started drinking again she needs to stop.   Thanks   Ceg        Previous Messages   ----- Message -----  From: Albertine Grates, MD  Sent: 08/07/2020  5:00 PM EDT  To: Iva Boop, MD   Dr Leone Payor,   Ms Weill came to the hospital due to weakness, she is found to have new onset of ascites, she reports stomach is tight, but no pain, no fever.  I am getting diagnostic and therapeutic paracentesis with fluid studies and cytology, also ordered hepatitis panel/AFP.  I plan to start her on spironolactone 100mg  /lasix40mg , and discharge her on Monday 4/4 after paracentesis.   could you or your PA see her in one week to monitor lytes and renal function, and follow up on the lab test results?   Thank you very much,   11-22-2001  Parke Poisson

## 2020-08-10 NOTE — Telephone Encounter (Signed)
Patient notified of the new recommendations.  She agrees to proceed.  She will be out of town on Mon, but will come for labs on Thursday am when she returns CT scan is scheduled at [facility] CT for 08/22/20.  She understands to come pick up her contrast and instructions when she comes for labs next week.  Follow up rescheduled for 4/20 with Dr. Leone Payor

## 2020-08-11 ENCOUNTER — Ambulatory Visit: Payer: 59 | Admitting: Allergy

## 2020-08-11 ENCOUNTER — Encounter: Payer: Self-pay | Admitting: Allergy

## 2020-08-11 ENCOUNTER — Other Ambulatory Visit: Payer: Self-pay

## 2020-08-11 VITALS — BP 138/70 | HR 93 | Temp 97.9°F | Resp 16 | Ht 69.0 in | Wt 177.0 lb

## 2020-08-11 DIAGNOSIS — J3089 Other allergic rhinitis: Secondary | ICD-10-CM | POA: Diagnosis not present

## 2020-08-11 DIAGNOSIS — H1013 Acute atopic conjunctivitis, bilateral: Secondary | ICD-10-CM | POA: Diagnosis not present

## 2020-08-11 MED ORDER — CARBINOXAMINE MALEATE 4 MG PO TABS: 8.0000 mg | ORAL_TABLET | Freq: Two times a day (BID) | ORAL | 5 refills | Status: DC | PRN

## 2020-08-11 MED ORDER — IPRATROPIUM BROMIDE 0.06 % NA SOLN: 2.0000 | Freq: Four times a day (QID) | NASAL | 5 refills | Status: DC | PRN

## 2020-08-11 NOTE — Progress Notes (Signed)
New Patient Note  RE: [name] MRN: [id number] DOB: 1960/09/06 Date of Office Visit: 08/11/2020  Primary care provider: [name], MD  Chief Complaint: allergies  History of present illness: Mallory Stewart is a 60 y.o. female presenting today for evaluation of non-seasonal allergic rhinitis/post-nasal drip.  She states she has had excessive post-nasal drainage.  She states the nasal drainage has improved a bit.  She states she would start most mornings with vomiting up clear mucus that she feels drained downed her throat overnight.  This has been ongoing for the past month or so.  She states she also has issues with watery eyes and sneezing as well which are typically year-round symptoms.   She reports no benefit from xyzal, flonase.  Little benefit from azelastine spray using 1 spray each nostril twice a day.  She is currently on zyrtec and noticed a little difference.   Denies history of sinus infections.   No history of asthma or food allergy.  She reports having childhood eczema that she outgrew.  She is on a gluten-free diet. She states she would have bloating and gas with gluten in the diet.      She had a recent fall and was hospitalized from 4/1-08/08/2020.  She will be going to vestibular therapy starting next week.  Review of systems: Review of Systems  Constitutional: Negative.   HENT:       See HPI  Eyes:       See HPI  Respiratory: Negative.   Cardiovascular: Negative.   Gastrointestinal: Negative.   Musculoskeletal: Positive for falls.  Skin: Negative.   Neurological: Positive for dizziness.    All other systems negative unless noted above in HPI  Past medical history: Past Medical History:  Diagnosis Date  . Alcohol-induced chronic pancreatitis ([place])   . B12 deficiency   . Concussion   . DDD (degenerative disc disease), cervical   . Diabetes ([place])   . DKA, type 2 ([place])   . Gastric outlet obstruction 08/12/2018  . Hypertension   .  Neuropathy   . Seasonal allergies     Past surgical history: Past Surgical History:  Procedure Laterality Date  . BIOPSY  01/19/2019   Procedure: BIOPSY;  Surgeon: Rush Landmark Telford Nab., MD;  Location: [place];  Service: Gastroenterology;;  . CHOLECYSTECTOMY N/A 07/28/2016   Procedure: LAPAROSCOPIC CHOLECYSTECTOMY WITH INTRAOPERATIVE CHOLANGIOGRAM;  Surgeon: Mickeal Skinner, MD;  Location: [place];  Service: General;  Laterality: N/A;  . ESOPHAGOGASTRODUODENOSCOPY (EGD) WITH PROPOFOL N/A 08/13/2018   Procedure: ESOPHAGOGASTRODUODENOSCOPY (EGD) WITH PROPOFOL;  Surgeon: Irving Copas., MD;  Location: [place];  Service: Gastroenterology;  Laterality: N/A;  . ESOPHAGOGASTRODUODENOSCOPY (EGD) WITH PROPOFOL N/A 01/19/2019   Procedure: ESOPHAGOGASTRODUODENOSCOPY (EGD) WITH PROPOFOL;  Surgeon: Rush Landmark Telford Nab., MD;  Location: [place];  Service: Gastroenterology;  Laterality: N/A;  . IR PARACENTESIS  08/08/2020  . LEEP  1990's  . ORIF FOOT FRACTURE  09/2008   L 5th metatarsal  . REFRACTIVE SURGERY  2000  . TONSILLECTOMY    . UPPER ESOPHAGEAL ENDOSCOPIC ULTRASOUND (EUS) N/A 08/13/2018   Procedure: UPPER ESOPHAGEAL ENDOSCOPIC ULTRASOUND (EUS);  Surgeon: Irving Copas., MD;  Location: [place];  Service: Gastroenterology;  Laterality: N/A;  . UPPER ESOPHAGEAL ENDOSCOPIC ULTRASOUND (EUS) N/A 01/19/2019   Procedure: UPPER ESOPHAGEAL ENDOSCOPIC ULTRASOUND (EUS);  Surgeon: Irving Copas., MD;  Location: [place];  Service: Gastroenterology;  Laterality: N/A;    Family history:  Family History  Problem Relation  Age of Onset  . Other Mother        tachycardia.Marland KitchenMarland Kitchen?afib  . Stroke Father        after hernia suegery  . Prostate cancer Father   . Other Father        global transient amnesia, unclear source  . Atrial fibrillation Sister   . Healthy Brother   . Healthy Brother   . Coronary artery disease Paternal Grandmother   . Heart attack Paternal  Grandmother 78  . Brain cancer Maternal Grandfather        ?  Marland Kitchen Cancer Paternal Grandfather        ?  Marland Kitchen Breast cancer Maternal Grandmother     Social history: Lives in a home with carpeting in the bedroom with gas heating and central cooling.  1 dog and 1 cat in the home.  There is no concern for water damage, mildew or roaches in the home.  She has a Editor, commissioning.  [place]  She denies a smoking history.  Medication List: Current Outpatient Medications  Medication Sig Dispense Refill  . ALPRAZolam (XANAX) 0.25 MG tablet Take 1 tablet (0.25 mg total) by mouth daily as needed for anxiety. 20 tablet 0  . amitriptyline (ELAVIL) 50 MG tablet Take 1 tablet (50 mg total) by mouth at bedtime. 30 tablet 3  . azelastine (OPTIVAR) 0.05 % ophthalmic solution Place 1 drop into both eyes 2 (two) times daily. (Patient taking differently: Place 1 drop into both eyes as needed (allergies).) 6 mL 12  . B-D UF III MINI PEN NEEDLES 31G X 5 MM MISC USE 4 TIMES A DAY AS DIRECTED 100 each 3  . Blood Glucose Monitoring Suppl (ONE TOUCH ULTRA 2) w/Device KIT Use to check blood sugars two times day 1 each 0  . cetirizine (ZYRTEC) 10 MG tablet Take 10 mg by mouth at bedtime.    . Cyanocobalamin (B-12) 1000 MCG TBCR Take 2,000 mcg by mouth daily. 30 tablet   . feeding supplement, GLUCERNA SHAKE, (GLUCERNA SHAKE) LIQD Take 237 mLs by mouth 3 (three) times daily between meals. 30 mL 0  . furosemide (LASIX) 40 MG tablet Take 1 tablet (40 mg total) by mouth daily. 30 tablet 0  . insulin glargine, 2 Unit Dial, (TOUJEO MAX SOLOSTAR) 300 UNIT/ML Solostar Pen Inject 34 Units into the skin daily at 2 PM. (Patient taking differently: Inject 30 Units into the skin daily at 2 PM.) 15 mL 6  . Lancets (ONETOUCH ULTRASOFT) lancets Use to check blood sugar two times a day. 200 each 3  . losartan (COZAAR) 100 MG tablet Take 1 tablet (100 mg total) by mouth daily. 30 tablet 11  . magnesium  oxide (MAG-OX) 400 (241.3 Mg) MG tablet Take 1 tablet (400 mg total) by mouth daily. 30 tablet 0  . meclizine (ANTIVERT) 25 MG tablet Take 1 tablet (25 mg total) by mouth 2 (two) times daily as needed for dizziness. 30 tablet 0  . metoprolol tartrate (LOPRESSOR) 50 MG tablet Take 1 tablet (50 mg total) by mouth 2 (two) times daily. 180 tablet 0  . Multiple Vitamin (MULTIVITAMIN WITH MINERALS) TABS tablet Take 1 tablet by mouth daily.    Glory Rosebush ULTRA test strip CHECK BLOOD SUGAR TWO TIMES DAILY AS DIRECTED 100 strip 7  . pantoprazole (PROTONIX) 40 MG tablet TAKE 1 TABLET BY MOUTH TWICE DAILY BEFORE A MEAL (Patient taking differently: Take 40 mg by mouth 2 (two) times  daily before a meal.) 60 tablet 1  . spironolactone (ALDACTONE) 25 MG tablet Take 4 tablets (100 mg total) by mouth daily. 30 tablet 0  . thiamine 100 MG tablet Take 1 tablet (100 mg total) by mouth daily. 30 tablet 0  . azelastine (ASTELIN) 0.1 % nasal spray Place 2 sprays into both nostrils 2 (two) times daily. Use in each nostril as directed (Patient not taking: No sig reported) 30 mL 12  . dapagliflozin propanediol (FARXIGA) 10 MG TABS tablet Take 10 mg by mouth daily before breakfast. (Patient not taking: Reported on 08/11/2020) 90 tablet 3  . methocarbamol (ROBAXIN) 500 MG tablet Take 1 tablet (500 mg total) by mouth every 8 (eight) hours as needed for muscle spasms. (Patient not taking: Reported on 08/11/2020) 20 tablet 0   No current facility-administered medications for this visit.    Known medication allergies: Allergies  Allergen Reactions  . Gluten Meal Other (See Comments)    Stomach distress  . Zocor [Simvastatin] Other (See Comments)    Elevated lfts?     Physical examination: Blood pressure 138/70, pulse 93, temperature 97.9 F (36.6 C), temperature source Temporal, resp. rate 16, height _0  (1.753 m), weight 177 lb (80.3 kg), SpO2 97 %.  General: Alert, interactive, in no acute distress. HEENT: PERRLA, TMs  pearly gray, turbinates minimally edematous without discharge, post-pharynx non erythematous. Neck: Supple without lymphadenopathy. Lungs: Clear to auscultation without wheezing, rhonchi or rales. {no increased work of breathing. CV: Normal S1, S2 without murmurs. Abdomen: Nondistended, nontender. Skin: Warm and dry, without lesions or rashes. Extremities:  No clubbing, cyanosis or edema. Neuro:   Grossly intact.  Diagnositics/Labs:  Allergy testing: Environmental allergy skin prick testing is positive to [place], [place] blue, meadow fescue, sweet vernal, Timothy, short ragweed, Cottonwood, maple, oak, Alternaria, Helminthosporium, Curvularia, Candida albicans, phoma betae, both dust mites, cat, dog Allergy testing results were read and interpreted by provider, documented by clinical staff.   Assessment and plan: Allergic rhinitis with conjunctivitis - environmental allergy testing is positive to tree pollens, grass pollens, weed pollens, molds,dust mites cat and dog.  - allergen avoidance measures discussed/handouts provided - for itchy/watery eyes can use over-the-counter Pataday 1 drop each eye daily as needed OR Aloway or Zaditor 1 drop each eye twice a day as needed - for nasal drainage/runny nose would try nasal Atrovent 2 sprays each nostril up to 3-4 times a day as needed for runny nose/nasal drainage.   If nose gets too dry stop use and can use nasal saline spray - Nasal saline spray (i.e., Simply Saline) or nasal saline lavage (i.e., NeilMed) is recommended as needed and prior to medicated nasal sprays. - if having generalized allergy symptoms like sneezing and itch then can try prescription based antihistamine, Carbinoxamine 3m twice a day (dose changed to 4 mg for insurance coverage) - allergen immunotherapy discussed today including protocol, benefits and risk.  Informational handout provided.  If interested in this therapuetic option you can check with your insurance carrier for  coverage.  Let [place] if you would like to proceed with this option.    Follow-up in 4-6 months or sooner if needed  I appreciate the opportunity to take part in [facility]. Please do not hesitate to contact me with questions.  Sincerely,   SPrudy Feeler MD Allergy/Immunology [facility]

## 2020-08-11 NOTE — Patient Instructions (Addendum)
Allergies - environmental allergy testing is positive to tree pollens, grass pollens, weed pollens, molds,dust mites cat and dog.  - allergen avoidance measures discussed/handouts provided - for itchy/watery eyes can use over-the-counter Pataday 1 drop each eye daily as needed OR Aloway or Zaditor 1 drop each eye twice a day as needed - for nasal drainage/runny nose would try nasal Atrovent 2 sprays each nostril up to 3-4 times a day as needed for runny nose/nasal drainage.   If nose gets too dry stop use and can use nasal saline spray - Nasal saline spray (i.e., Simply Saline) or nasal saline lavage (i.e., NeilMed) is recommended as needed and prior to medicated nasal sprays. - if having generalized allergy symptoms like sneezing and itch then can try prescription based antihistamine, Carbinoxamine 6mg  twice a day (dose changed to 4 mg for insurance coverage) - allergen immunotherapy discussed today including protocol, benefits and risk.  Informational handout provided.  If interested in this therapuetic option you can check with your insurance carrier for coverage.  Let know if you would like to proceed with this option.    Follow-up in 4-6 months or sooner if needed

## 2020-08-13 LAB — CULTURE, BODY FLUID W GRAM STAIN -BOTTLE: Culture: NO GROWTH

## 2020-08-18 ENCOUNTER — Other Ambulatory Visit: Payer: Self-pay

## 2020-08-18 ENCOUNTER — Ambulatory Visit (INDEPENDENT_AMBULATORY_CARE_PROVIDER_SITE_OTHER)
Admission: RE | Admit: 2020-08-18 | Discharge: 2020-08-18 | Disposition: A | Discharge status: Home / Self Care | Payer: 59 | Source: Ambulatory Visit | Attending: Family Medicine | Admitting: Family Medicine

## 2020-08-18 ENCOUNTER — Encounter: Payer: Self-pay | Admitting: Family Medicine

## 2020-08-18 ENCOUNTER — Ambulatory Visit: Payer: 59 | Admitting: Family Medicine

## 2020-08-18 ENCOUNTER — Other Ambulatory Visit (INDEPENDENT_AMBULATORY_CARE_PROVIDER_SITE_OTHER): Payer: 59

## 2020-08-18 VITALS — BP 110/80 | HR 78 | Temp 97.9°F | Ht 68.5 in

## 2020-08-18 DIAGNOSIS — E8809 Other disorders of plasma-protein metabolism, not elsewhere classified: Secondary | ICD-10-CM | POA: Diagnosis not present

## 2020-08-18 DIAGNOSIS — E871 Hypo-osmolality and hyponatremia: Secondary | ICD-10-CM

## 2020-08-18 DIAGNOSIS — Y92009 Unspecified place in unspecified non-institutional (private) residence as the place of occurrence of the external cause: Secondary | ICD-10-CM

## 2020-08-18 DIAGNOSIS — M25571 Pain in right ankle and joints of right foot: Secondary | ICD-10-CM

## 2020-08-18 DIAGNOSIS — W19XXXA Unspecified fall, initial encounter: Secondary | ICD-10-CM

## 2020-08-18 DIAGNOSIS — M79671 Pain in right foot: Secondary | ICD-10-CM

## 2020-08-18 DIAGNOSIS — R188 Other ascites: Secondary | ICD-10-CM | POA: Diagnosis not present

## 2020-08-18 DIAGNOSIS — R7401 Elevation of levels of liver transaminase levels: Secondary | ICD-10-CM

## 2020-08-18 DIAGNOSIS — E440 Moderate protein-calorie malnutrition: Secondary | ICD-10-CM

## 2020-08-18 DIAGNOSIS — K760 Fatty (change of) liver, not elsewhere classified: Secondary | ICD-10-CM

## 2020-08-18 DIAGNOSIS — R748 Abnormal levels of other serum enzymes: Secondary | ICD-10-CM | POA: Diagnosis not present

## 2020-08-18 DIAGNOSIS — S91114S Laceration without foreign body of right lesser toe(s) without damage to nail, sequela: Secondary | ICD-10-CM

## 2020-08-18 LAB — CBC
HCT: 33.1 % — ABNORMAL LOW (ref 36.0–46.0)
Hemoglobin: 11.3 g/dL — ABNORMAL LOW (ref 12.0–15.0)
MCHC: 34.1 g/dL (ref 30.0–36.0)
MCV: 97.4 fl (ref 78.0–100.0)
Platelets: 335 10*3/uL (ref 150.0–400.0)
RBC: 3.4 Mil/uL — ABNORMAL LOW (ref 3.87–5.11)
RDW: 15 % (ref 11.5–15.5)
WBC: 9.7 10*3/uL (ref 4.0–10.5)

## 2020-08-18 LAB — COMPREHENSIVE METABOLIC PANEL
ALT: 21 U/L (ref 0–35)
AST: 64 U/L — ABNORMAL HIGH (ref 0–37)
Albumin: 3.3 g/dL — ABNORMAL LOW (ref 3.5–5.2)
Alkaline Phosphatase: 198 U/L — ABNORMAL HIGH (ref 39–117)
BUN: 9 mg/dL (ref 6–23)
CO2: 27 mEq/L (ref 19–32)
Calcium: 8.9 mg/dL (ref 8.4–10.5)
Chloride: 94 mEq/L — ABNORMAL LOW (ref 96–112)
Creatinine, Ser: 0.99 mg/dL (ref 0.40–1.20)
GFR: 63.27 mL/min (ref >60.00)
Glucose, Bld: 284 mg/dL — ABNORMAL HIGH (ref 70–99)
Potassium: 4.3 mEq/L (ref 3.5–5.1)
Sodium: 130 mEq/L — ABNORMAL LOW (ref 135–145)
Total Bilirubin: 1.7 mg/dL — ABNORMAL HIGH (ref 0.2–1.2)
Total Protein: 7.3 g/dL (ref 6.0–8.3)

## 2020-08-18 LAB — MAGNESIUM: Magnesium: 1.5 mg/dL (ref 1.5–2.5)

## 2020-08-18 MED ORDER — TRAMADOL HCL 50 MG PO TABS: 50.0000 mg | ORAL_TABLET | Freq: Three times a day (TID) | ORAL | 0 refills | Status: AC | PRN

## 2020-08-18 NOTE — Patient Instructions (Addendum)
We will call with lab results for magnesium.  Ice  foot and ankle for 10 min 3 times daily and elevate foot as much as able  Wear post op shoe on right foot.  Avoid weight bearing.  Keep laceration on foot clean and dry.. okay to wash 24 hours after glue applied.  Can treat pain with tramadol as needed for pain.  When pain improving start gentle range of motion of ankle several times a day.

## 2020-08-18 NOTE — Progress Notes (Signed)
Patient ID: Mallory Stewart, female    DOB: 1960/08/08, 60 y.o.   MRN: [id number]  This visit was conducted in person.  BP 110/80   Pulse 78   Temp 97.9 F (36.6 C) (Temporal)   Ht 5' 8.5" (1.74 m)   SpO2 95%   BMI 26.52 kg/m    CC:  Chief Complaint  Patient presents with  . Hospitalization Follow-up  . Fall    Yesterday morning in [place]  . Foot Injury    Right Foot    Subjective:   HPI: Mallory Stewart is a 60 y.o. female presenting on 08/18/2020 for Hospitalization Follow-up, Fall (Yesterday morning in [place]), and Foot Injury (Right Foot)  Admitted on 08/05/2020 to 08/08/20 for dizziness,unsteadiness weakness and frequent falls.  Recommendations for Outpatient Follow-up:  1. F/u with PCP within a week  for hospital discharge follow up 2. F/u with GI Dr. Carlean Purl in 1 to 2 weeks 3. Vestibular rehab referral placed 4. Walker ordered at discharge   Hospital note reviewed in detail and copied below for informational purposes. ED Course: In the emergency department, she was intermittently tachycardic, BP was 129/93, other vital signs are within normal range. Work-up in the ED showed hypoalbuminemia, elevated liver enzymes, elevated total bilirubin, hypomagnesemia, hyponatremia, normocytic anemia. Urinalysis was unimpressive for UTI. MRI of brain without contrast showed no acute intracranial abnormality or significant interval change. IV hydration was provided, magnesium was replenished. Hospitalist was asked to admit patient for further evaluation and management.  Hyponatremia -Sodium 126 on presentation, sodium 132 at discharge -She reported poor oral intake last month, she also likely have cirrhosis   Hypomagnesemia Mag 1.1 on presentation, she received aggressive iv mag supplement, mag improved to 1.8, she is discharged on magnesium supplement daily   Abdominal distention/ascites - abdominal ultrasound  showed ascites -She hasabnormal LFTs AST  elevated 60-79/tbili 2.8/abnormal INR1.7/mild thrombocytopeniaplt 136-141 -CT abdomen obtained a year ago showed severe hepatic steatosis and mild enlargement of spleen -suspect early cirrhosis -she reports abdomen is being tight, denies pain,  -she underwent diagnostic and therapeutic paracentesis with 4liter fluids removed, fluids study plus cytology ordered, serum albumin level  2.5, iv albumin 12.5 x1 given with paracentesis, she felt better after paracentesis -also ordered autoimmune hepatitis panel and AFP/CEA -Started on spironolactone 100 mg/Lasix 40 mg daily -case discussed with GI Dr Lyndel Safe who agrees with currently plan and recommended outpatient GI follow up in one week. -Staff message sent to Dr. Carlean Purl   Bilateral lower extremity pitting edema Left > right Venous doppler no DVT Elevate legs Restarted on spironolactone 100 mg/Lasix 40 mg daily  Normocytic anemia -No overt sign of external bleed -Hemoglobin 9.9 -Follow-up with PCP and GI -Likely anemia of chronic disease  Hypertension,stable,continue Lopressor and Cozaar Restarted on Lasix and spironolactone, she is instructed to check blood pressure at home, follow-up with PCP  Insulin-dependent type 2 diabetes Continue home meds Wilder Glade and Toujeo  Recent A1c 5.8 in February 2022 A.m. blood glucose 91 -169 in the hospital F/u with pcp  Dizziness/vertigo Does has fluid seen right ear PT recommended outpatient vestibular rehab Prescribed meclizine as needed   08/18/20 Today she reports she is feeling better after fluid draining out of abdomen. She feels much better.  Tolerating spironolactone and lasix. BP Readings from Last 3 Encounters:  08/18/20 110/80  08/11/20 138/70  08/08/20 (!) 147/98    She saw ENT regarding vertigo dizziness.  She is set up for vestibular rehab.  Using  walker now.  Using Antivert twice daily.   She lost balance and  Tripped fell yesterday in [place]... hurt her right  foot... cut bottom of foot.. had a lot of bleeding.. may have hit her walker.  Pain bottom of foot and lateral ankle and foot.  Swelling in foot and lateral ankle.    Blood sugar this AM 315  Relevant past medical, surgical, family and social history reviewed and updated as indicated. Interim medical history since our last visit reviewed. Allergies and medications reviewed and updated. Outpatient Medications Prior to Visit  Medication Sig Dispense Refill  . ALPRAZolam (XANAX) 0.25 MG tablet Take 1 tablet (0.25 mg total) by mouth daily as needed for anxiety. 20 tablet 0  . amitriptyline (ELAVIL) 50 MG tablet Take 1 tablet (50 mg total) by mouth at bedtime. 30 tablet 3  . azelastine (OPTIVAR) 0.05 % ophthalmic solution Place 1 drop into both eyes 2 (two) times daily as needed.    . B-D UF III MINI PEN NEEDLES 31G X 5 MM MISC USE 4 TIMES A DAY AS DIRECTED 100 each 3  . Blood Glucose Monitoring Suppl (ONE TOUCH ULTRA 2) w/Device KIT Use to check blood sugars two times day 1 each 0  . Carbinoxamine Maleate 4 MG TABS Take 2 tablets (8 mg total) by mouth 2 (two) times daily as needed (Allergies). 28 tablet 5  . cetirizine (ZYRTEC) 10 MG tablet Take 10 mg by mouth at bedtime.    . Cyanocobalamin (B-12) 1000 MCG TBCR Take 2,000 mcg by mouth daily. 30 tablet   . dapagliflozin propanediol (FARXIGA) 10 MG TABS tablet Take 10 mg by mouth daily before breakfast. 90 tablet 3  . feeding supplement, GLUCERNA SHAKE, (GLUCERNA SHAKE) LIQD Take 237 mLs by mouth 3 (three) times daily between meals. 30 mL 0  . furosemide (LASIX) 40 MG tablet Take 1 tablet (40 mg total) by mouth daily. 30 tablet 0  . insulin glargine, 1 Unit Dial, (TOUJEO SOLOSTAR) 300 UNIT/ML Solostar Pen Inject 30 Units into the skin daily at 2 PM.    . ipratropium (ATROVENT) 0.06 % nasal spray Place 2 sprays into both nostrils 4 (four) times daily as needed (Nasal drainage). 15 mL 5  . Lancets (ONETOUCH ULTRASOFT) lancets Use to check blood sugar  two times a day. 200 each 3  . losartan (COZAAR) 100 MG tablet Take 1 tablet (100 mg total) by mouth daily. 30 tablet 11  . magnesium oxide (MAG-OX) 400 (241.3 Mg) MG tablet Take 1 tablet (400 mg total) by mouth daily. 30 tablet 0  . meclizine (ANTIVERT) 25 MG tablet Take 1 tablet (25 mg total) by mouth 2 (two) times daily as needed for dizziness. 30 tablet 0  . metoprolol tartrate (LOPRESSOR) 50 MG tablet Take 1 tablet (50 mg total) by mouth 2 (two) times daily. 180 tablet 0  . Multiple Vitamin (MULTIVITAMIN WITH MINERALS) TABS tablet Take 1 tablet by mouth daily.    Glory Rosebush ULTRA test strip CHECK BLOOD SUGAR TWO TIMES DAILY AS DIRECTED 100 strip 7  . pantoprazole (PROTONIX) 40 MG tablet TAKE 1 TABLET BY MOUTH TWICE DAILY BEFORE A MEAL 60 tablet 1  . spironolactone (ALDACTONE) 25 MG tablet Take 4 tablets (100 mg total) by mouth daily. 30 tablet 0  . thiamine 100 MG tablet Take 1 tablet (100 mg total) by mouth daily. 30 tablet 0  . azelastine (ASTELIN) 0.1 % nasal spray Place 2 sprays into both nostrils 2 (two) times daily. Use  in each nostril as directed (Patient not taking: No sig reported) 30 mL 12  . azelastine (OPTIVAR) 0.05 % ophthalmic solution Place 1 drop into both eyes 2 (two) times daily. (Patient taking differently: Place 1 drop into both eyes as needed (allergies).) 6 mL 12  . insulin glargine, 2 Unit Dial, (TOUJEO MAX SOLOSTAR) 300 UNIT/ML Solostar Pen Inject 34 Units into the skin daily at 2 PM. (Patient taking differently: Inject 30 Units into the skin daily at 2 PM.) 15 mL 6  . methocarbamol (ROBAXIN) 500 MG tablet Take 1 tablet (500 mg total) by mouth every 8 (eight) hours as needed for muscle spasms. (Patient not taking: No sig reported) 20 tablet 0   No facility-administered medications prior to visit.     Per HPI unless specifically indicated in ROS section below Review of Systems  Constitutional: Negative for fatigue and fever.  HENT: Negative for congestion.   Eyes:  Negative for pain.  Respiratory: Negative for cough and shortness of breath.   Cardiovascular: Negative for chest pain, palpitations and leg swelling.  Gastrointestinal: Negative for abdominal pain.  Genitourinary: Negative for dysuria and vaginal bleeding.  Musculoskeletal: Positive for gait problem. Negative for back pain.  Neurological: Negative for syncope, light-headedness and headaches.  Psychiatric/Behavioral: Negative for dysphoric mood.   Objective:  BP 110/80   Pulse 78   Temp 97.9 F (36.6 C) (Temporal)   Ht 5' 8.5" (1.74 m)   SpO2 95%   BMI 26.52 kg/m   Wt Readings from Last 3 Encounters:  08/11/20 177 lb (80.3 kg)  08/06/20 170 lb (77.1 kg)  07/19/20 191 lb 8 oz (86.9 kg)      Physical Exam Constitutional:      General: She is not in acute distress.    Appearance: Normal appearance. She is well-developed. She is not ill-appearing or toxic-appearing.  HENT:     Head: Normocephalic.     Right Ear: Hearing, tympanic membrane, ear canal and external ear normal. Tympanic membrane is not erythematous, retracted or bulging.     Left Ear: Hearing, tympanic membrane, ear canal and external ear normal. Tympanic membrane is not erythematous, retracted or bulging.     Nose: No mucosal edema or rhinorrhea.     Right Sinus: No maxillary sinus tenderness or frontal sinus tenderness.     Left Sinus: No maxillary sinus tenderness or frontal sinus tenderness.     Mouth/Throat:     Pharynx: Uvula midline.  Eyes:     General: Lids are normal. Lids are everted, no foreign bodies appreciated.     Conjunctiva/sclera: Conjunctivae normal.     Pupils: Pupils are equal, round, and reactive to light.  Neck:     Thyroid: No thyroid mass or thyromegaly.     Vascular: No carotid bruit.     Trachea: Trachea normal.  Cardiovascular:     Rate and Rhythm: Normal rate and regular rhythm.     Pulses: Normal pulses.     Heart sounds: Normal heart sounds, S1 normal and S2 normal. No murmur  heard. No friction rub. No gallop.   Pulmonary:     Effort: Pulmonary effort is normal. No tachypnea or respiratory distress.     Breath sounds: Normal breath sounds. No decreased breath sounds, wheezing, rhonchi or rales.  Abdominal:     General: Bowel sounds are normal.     Palpations: Abdomen is soft.     Tenderness: There is no abdominal tenderness.     Comments: Improved abdominal  distention  Musculoskeletal:     Cervical back: Normal range of motion and neck supple.     Comments: Right ankle with swelling and contusion, diffuse pain, swelling contusion over right dorsal foot, ttp at base of 3,4, and 5th digits   Laceration at base of 5th digit on right 1 cm in length  Skin:    General: Skin is warm and dry.     Findings: No rash.  Neurological:     Mental Status: She is alert.  Psychiatric:        Mood and Affect: Mood is not anxious or depressed.        Speech: Speech normal.        Behavior: Behavior normal. Behavior is cooperative.        Thought Content: Thought content normal.        Judgment: Judgment normal.       Results for orders placed or performed during the hospital encounter of 08/05/20  SARS CORONAVIRUS 2 (TAT 6-24 HRS) Nasopharyngeal Nasopharyngeal Swab   Specimen: Nasopharyngeal Swab  Result Value Ref Range   SARS Coronavirus 2 NEGATIVE NEGATIVE  Gram stain   Specimen: Fluid  Result Value Ref Range   Specimen Description FLUID    Special Requests NONE    Gram Stain      RARE WBC PRESENT, PREDOMINANTLY MONONUCLEAR NO ORGANISMS SEEN Performed at [facility], [place] [address] [place], [place] [address]    Report Status 08/09/2020 FINAL   Culture, body fluid w Gram Stain-bottle   Specimen: Fluid  Result Value Ref Range   Specimen Description FLUID    Special Requests NONE    Culture      NO GROWTH 5 DAYS Performed at [facility], [address] [place], [place] [address]    Report Status 08/13/2020 FINAL   Basic metabolic panel   Result Value Ref Range   Sodium 126 (L) 135 - 145 mmol/L   Potassium 4.4 3.5 - 5.1 mmol/L   Chloride 96 (L) 98 - 111 mmol/L   CO2 20 (L) 22 - 32 mmol/L   Glucose, Bld 120 (H) 70 - 99 mg/dL   BUN 9 6 - 20 mg/dL   Creatinine, Ser 1.01 (H) 0.44 - 1.00 mg/dL   Calcium 8.1 (L) 8.9 - 10.3 mg/dL   GFR, Estimated >60 >60 mL/min   Anion gap 10 5 - 15  CBC  Result Value Ref Range   WBC 10.0 4.0 - 10.5 K/uL   RBC 3.31 (L) 3.87 - 5.11 MIL/uL   Hemoglobin 11.0 (L) 12.0 - 15.0 g/dL   HCT 31.2 (L) 36.0 - 46.0 %   MCV 94.3 80.0 - 100.0 fL   MCH 33.2 26.0 - 34.0 pg   MCHC 35.3 30.0 - 36.0 g/dL   RDW 14.6 11.5 - 15.5 %   Platelets 176 150 - 400 K/uL   nRBC 0.0 0.0 - 0.2 %  Urinalysis, Routine w reflex microscopic  Result Value Ref Range   Color, Urine AMBER (A) YELLOW   APPearance CLOUDY (A) CLEAR   Specific Gravity, Urine 1.012 1.005 - 1.030   pH 5.0 5.0 - 8.0   Glucose, UA NEGATIVE NEGATIVE mg/dL   Hgb urine dipstick SMALL (A) NEGATIVE   Bilirubin Urine NEGATIVE NEGATIVE   Ketones, ur 5 (A) NEGATIVE mg/dL   Protein, ur NEGATIVE NEGATIVE mg/dL   Nitrite NEGATIVE NEGATIVE   Leukocytes,Ua SMALL (A) NEGATIVE   RBC / HPF 11-20 0 - 5  RBC/hpf   WBC, UA 0-5 0 - 5 WBC/hpf   Bacteria, UA MANY (A) NONE SEEN   Squamous Epithelial / LPF 21-50 0 - 5   Mucus PRESENT   Hepatic function panel  Result Value Ref Range   Total Protein 6.6 6.5 - 8.1 g/dL   Albumin 2.9 (L) 3.5 - 5.0 g/dL   AST 79 (H) 15 - 41 U/L   ALT 29 0 - 44 U/L   Alkaline Phosphatase 291 (H) 38 - 126 U/L   Total Bilirubin 1.9 (H) 0.3 - 1.2 mg/dL   Bilirubin, Direct 0.9 (H) 0.0 - 0.2 mg/dL   Indirect Bilirubin 1.0 (H) 0.3 - 0.9 mg/dL  Magnesium  Result Value Ref Range   Magnesium 1.1 (L) 1.7 - 2.4 mg/dL  HIV Antibody (routine testing w rflx)  Result Value Ref Range   HIV Screen 4th Generation wRfx Non Reactive Non Reactive  Comprehensive metabolic panel  Result Value Ref Range   Sodium 129 (L) 135 - 145 mmol/L   Potassium  4.4 3.5 - 5.1 mmol/L   Chloride 99 98 - 111 mmol/L   CO2 19 (L) 22 - 32 mmol/L   Glucose, Bld 144 (H) 70 - 99 mg/dL   BUN 8 6 - 20 mg/dL   Creatinine, Ser 0.92 0.44 - 1.00 mg/dL   Calcium 8.1 (L) 8.9 - 10.3 mg/dL   Total Protein 6.2 (L) 6.5 - 8.1 g/dL   Albumin 2.7 (L) 3.5 - 5.0 g/dL   AST 70 (H) 15 - 41 U/L   ALT 26 0 - 44 U/L   Alkaline Phosphatase 263 (H) 38 - 126 U/L   Total Bilirubin 2.8 (H) 0.3 - 1.2 mg/dL   GFR, Estimated >60 >60 mL/min   Anion gap 11 5 - 15  CBC  Result Value Ref Range   WBC 8.6 4.0 - 10.5 K/uL   RBC 3.05 (L) 3.87 - 5.11 MIL/uL   Hemoglobin 10.2 (L) 12.0 - 15.0 g/dL   HCT 29.0 (L) 36.0 - 46.0 %   MCV 95.1 80.0 - 100.0 fL   MCH 33.4 26.0 - 34.0 pg   MCHC 35.2 30.0 - 36.0 g/dL   RDW 14.8 11.5 - 15.5 %   Platelets 136 (L) 150 - 400 K/uL   nRBC 0.0 0.0 - 0.2 %  Magnesium  Result Value Ref Range   Magnesium 1.4 (L) 1.7 - 2.4 mg/dL  Phosphorus  Result Value Ref Range   Phosphorus 2.8 2.5 - 4.6 mg/dL  Osmolality  Result Value Ref Range   Osmolality 274 (L) 275 - 295 mOsm/kg  Protime-INR  Result Value Ref Range   Prothrombin Time 19.0 (H) 11.4 - 15.2 seconds   INR 1.7 (H) 0.8 - 1.2  APTT  Result Value Ref Range   aPTT 42 (H) 24 - 36 seconds  Glucose, capillary  Result Value Ref Range   Glucose-Capillary 203 (H) 70 - 99 mg/dL  CBC with Differential/Platelet  Result Value Ref Range   WBC 8.0 4.0 - 10.5 K/uL   RBC 2.89 (L) 3.87 - 5.11 MIL/uL   Hemoglobin 9.9 (L) 12.0 - 15.0 g/dL   HCT 27.6 (L) 36.0 - 46.0 %   MCV 95.5 80.0 - 100.0 fL   MCH 34.3 (H) 26.0 - 34.0 pg   MCHC 35.9 30.0 - 36.0 g/dL   RDW 14.9 11.5 - 15.5 %   Platelets 141 (L) 150 - 400 K/uL   nRBC 0.0 0.0 - 0.2 %   Neutrophils  Relative % 66 %   Neutro Abs 5.4 1.7 - 7.7 K/uL   Lymphocytes Relative 19 %   Lymphs Abs 1.6 0.7 - 4.0 K/uL   Monocytes Relative 12 %   Monocytes Absolute 0.9 0.1 - 1.0 K/uL   Eosinophils Relative 1 %   Eosinophils Absolute 0.1 0.0 - 0.5 K/uL   Basophils  Relative 1 %   Basophils Absolute 0.1 0.0 - 0.1 K/uL   Immature Granulocytes 1 %   Abs Immature Granulocytes 0.04 0.00 - 0.07 K/uL  Comprehensive metabolic panel  Result Value Ref Range   Sodium 135 135 - 145 mmol/L   Potassium 4.5 3.5 - 5.1 mmol/L   Chloride 105 98 - 111 mmol/L   CO2 22 22 - 32 mmol/L   Glucose, Bld 91 70 - 99 mg/dL   BUN 6 6 - 20 mg/dL   Creatinine, Ser 0.82 0.44 - 1.00 mg/dL   Calcium 8.4 (L) 8.9 - 10.3 mg/dL   Total Protein 5.7 (L) 6.5 - 8.1 g/dL   Albumin 2.5 (L) 3.5 - 5.0 g/dL   AST 63 (H) 15 - 41 U/L   ALT 22 0 - 44 U/L   Alkaline Phosphatase 212 (H) 38 - 126 U/L   Total Bilirubin 2.0 (H) 0.3 - 1.2 mg/dL   GFR, Estimated >60 >60 mL/min   Anion gap 8 5 - 15  Magnesium  Result Value Ref Range   Magnesium 1.6 (L) 1.7 - 2.4 mg/dL  Glucose, capillary  Result Value Ref Range   Glucose-Capillary 80 70 - 99 mg/dL  Glucose, capillary  Result Value Ref Range   Glucose-Capillary 154 (H) 70 - 99 mg/dL  Comprehensive metabolic panel  Result Value Ref Range   Sodium 132 (L) 135 - 145 mmol/L   Potassium 4.2 3.5 - 5.1 mmol/L   Chloride 101 98 - 111 mmol/L   CO2 23 22 - 32 mmol/L   Glucose, Bld 169 (H) 70 - 99 mg/dL   BUN <5 (L) 6 - 20 mg/dL   Creatinine, Ser 0.78 0.44 - 1.00 mg/dL   Calcium 8.7 (L) 8.9 - 10.3 mg/dL   Total Protein 6.3 (L) 6.5 - 8.1 g/dL   Albumin 2.5 (L) 3.5 - 5.0 g/dL   AST 67 (H) 15 - 41 U/L   ALT 25 0 - 44 U/L   Alkaline Phosphatase 234 (H) 38 - 126 U/L   Total Bilirubin 2.0 (H) 0.3 - 1.2 mg/dL   GFR, Estimated >60 >60 mL/min   Anion gap 8 5 - 15  Magnesium  Result Value Ref Range   Magnesium 1.8 1.7 - 2.4 mg/dL  CEA  Result Value Ref Range   CEA 8.5 (H) 0.0 - 4.7 ng/mL  AFP tumor marker  Result Value Ref Range   AFP, Serum, Tumor Marker 2.3 0.0 - 9.2 ng/mL  Hepatitis panel, acute  Result Value Ref Range   Hepatitis B Surface Ag NON REACTIVE NON REACTIVE   HCV Ab NON REACTIVE NON REACTIVE   Hep A IgM NON REACTIVE NON REACTIVE    Hep B C IgM NON REACTIVE NON REACTIVE  ANA, IFA (with reflex)  Result Value Ref Range   ANA Ab, IFA Negative   Mitochondrial antibodies  Result Value Ref Range   Mitochondrial M2 Ab, IgG <20.0 0.0 - 20.0 Units  Anti-smooth muscle antibody, IgG  Result Value Ref Range   F-Actin IgG 9 0 - 19 Units  Glucose, capillary  Result Value Ref Range   Glucose-Capillary  171 (H) 70 - 99 mg/dL  Glucose, capillary  Result Value Ref Range   Glucose-Capillary 177 (H) 70 - 99 mg/dL  Glucose, capillary  Result Value Ref Range   Glucose-Capillary 163 (H) 70 - 99 mg/dL  Lactate dehydrogenase (pleural or peritoneal fluid)  Result Value Ref Range   LD, Fluid 56 (H) 3 - 23 U/L   Fluid Type-FLDH ABD PERITONEAL   Body fluid cell count with differential  Result Value Ref Range   Fluid Type-FCT ABD PERITONEAL    Color, Fluid YELLOW (A) YELLOW   Appearance, Fluid CLEAR CLEAR   Total Nucleated Cell Count, Fluid 182 0 - 1,000 cu mm   Neutrophil Count, Fluid 8 0 - 25 %   Lymphs, Fluid 53 %   Monocyte-Macrophage-Serous Fluid 39 (L) 50 - 90 %   Eos, Fluid 0 %   Other Cells, Fluid MESOTHELIAL CELLS %  Albumin, pleural or peritoneal fluid  Result Value Ref Range   Albumin, Fluid 1.3 g/dL   Fluid Type-FALB ABD PERITONEAL   Protein, pleural or peritoneal fluid  Result Value Ref Range   Total protein, fluid <3.0 g/dL   Fluid Type-FTP ABD PERITONEAL   PH, Body Fluid  Result Value Ref Range   pH, Body Fluid 7.6 Not Estab.   Source ABD PERITONEAL   Glucose, capillary  Result Value Ref Range   Glucose-Capillary 188 (H) 70 - 99 mg/dL  CBG monitoring, ED  Result Value Ref Range   Glucose-Capillary 131 (H) 70 - 99 mg/dL  CBG monitoring, ED  Result Value Ref Range   Glucose-Capillary 120 (H) 70 - 99 mg/dL  CBG monitoring, ED  Result Value Ref Range   Glucose-Capillary 114 (H) 70 - 99 mg/dL  CBG monitoring, ED  Result Value Ref Range   Glucose-Capillary 170 (H) 70 - 99 mg/dL  Cytology - Non PAP;   Result Value Ref Range   CYTOLOGY - NON GYN      CYTOLOGY - NON PAP CASE: [id number] PATIENT: Diannah Stoudt Non-Gynecological Cytology Report     Clinical History: None provided Specimen Submitted:  A. ASCITES, PARACENTESIS:   FINAL MICROSCOPIC DIAGNOSIS: - No malignant cells identified  SPECIMEN ADEQUACY: Satisfactory for evaluation  DIAGNOSTIC COMMENTS: Mixed acute and chronic inflammation is present.  GROSS: Received is/are 800cc's of gold fluid.(ES:es) Smears: 0 Concentration Method (Thin Prep): 1 Cell Block: 1 Conventional Additional Studies: 2 Hematology slides labeled  [id number]     Final Diagnosis performed by Thressa Sheller, MD.   Electronically signed 08/09/2020 Technical and / or Professional components performed at [facility]. [facility], [place] [address] [place], [place] [address].  Immunohistochemistry Technical component (if applicable) was performed at [facility]. [address] [place], [place], [place] [address].   IMMUNOHISTOCHEMISTRY DISCLAIMER (if appli cable): Some of these immunohistochemical stains may have been developed and the performance characteristics determine by [facility]. Some may not have been cleared or approved by the U.S. Food and Drug Administration. The FDA has determined that such clearance or approval is not necessary. This test is used for clinical purposes. It should not be regarded as investigational or for research. This laboratory is certified under the [place] (CLIA-88) as qualified to perform high complexity clinical laboratory testing.  The controls stained appropriately.     This visit occurred during the SARS-CoV-2 public health emergency.  Safety protocols were in place, including screening questions prior to the visit, additional usage of staff PPE,  and extensive cleaning of exam room while observing appropriate contact time as  indicated for disinfecting solutions.   COVID 19 screen:  No recent travel or known exposure to COVID19 The patient denies respiratory symptoms of COVID 19 at this time. The importance of social distancing was discussed today.   Assessment and Plan    Problem List Items Addressed This Visit    abdominal acites    Concerning for early cirrhosis... has  appt with GI scheduled.      Acute right ankle pain    X-ray showed no fracture in ankle or foot. Likely sprain.Marland Kitchen treat with ice , elevation and tramadol for pain      Relevant Orders   DG Ankle Complete Right (Completed)   Elevated alkaline phosphatase level - Primary    Lilyke due to new dx of possible liver disease.      Fall at home, initial encounter    Discussed home safety and limited weight bearing... use wheelchair or walker for now.      Relevant Orders   DG Ankle Complete Right (Completed)   DG Foot Complete Right (Completed)   Hepatic steatosis   Hypoalbuminemia   Hypomagnesemia    Re-eval in office today.      Relevant Orders   Magnesium (Completed)   Hyponatremia     Follow over time.      Laceration of lesser toe of right foot without foreign body present or damage to nail    Area irrigated and cleaned with saline and laceration closed with tissue glue and steri strips. No complications.      Malnutrition of moderate degree (HCC)    Encouraged po intake.  Albumin is low.      Right foot pain    Likely doot sprain.Marland Kitchen treat with tramadol for pain, ice, elevation and post op shoe. Close follow up.  Limit weight bearing.      Relevant Orders   DG Foot Complete Right (Completed)   Transaminitis       Eliezer Lofts, MD

## 2020-08-20 ENCOUNTER — Other Ambulatory Visit: Payer: Self-pay | Admitting: Family Medicine

## 2020-08-20 ENCOUNTER — Other Ambulatory Visit: Payer: Self-pay | Admitting: Internal Medicine

## 2020-08-22 ENCOUNTER — Other Ambulatory Visit: Payer: Self-pay

## 2020-08-22 ENCOUNTER — Ambulatory Visit (INDEPENDENT_AMBULATORY_CARE_PROVIDER_SITE_OTHER)
Admission: RE | Admit: 2020-08-22 | Discharge: 2020-08-22 | Disposition: A | Discharge status: Home / Self Care | Payer: 59 | Source: Ambulatory Visit | Attending: Internal Medicine | Admitting: Internal Medicine

## 2020-08-22 DIAGNOSIS — R188 Other ascites: Secondary | ICD-10-CM

## 2020-08-22 MED ORDER — IOHEXOL 300 MG/ML  SOLN
100.0000 mL | Freq: Once | INTRAMUSCULAR | Status: AC | PRN
  Administered 2020-08-22: 100 mL via INTRAVENOUS

## 2020-08-24 ENCOUNTER — Ambulatory Visit: Payer: 59 | Admitting: Internal Medicine

## 2020-08-24 ENCOUNTER — Other Ambulatory Visit (INDEPENDENT_AMBULATORY_CARE_PROVIDER_SITE_OTHER): Payer: 59

## 2020-08-24 ENCOUNTER — Other Ambulatory Visit: Payer: Self-pay | Admitting: Internal Medicine

## 2020-08-24 ENCOUNTER — Other Ambulatory Visit: Payer: Self-pay

## 2020-08-24 ENCOUNTER — Encounter: Payer: Self-pay | Admitting: Internal Medicine

## 2020-08-24 VITALS — BP 110/64 | HR 64 | Ht 68.5 in | Wt 175.0 lb

## 2020-08-24 DIAGNOSIS — K766 Portal hypertension: Secondary | ICD-10-CM

## 2020-08-24 DIAGNOSIS — K860 Alcohol-induced chronic pancreatitis: Secondary | ICD-10-CM

## 2020-08-24 DIAGNOSIS — R188 Other ascites: Secondary | ICD-10-CM

## 2020-08-24 DIAGNOSIS — R932 Abnormal findings on diagnostic imaging of liver and biliary tract: Secondary | ICD-10-CM

## 2020-08-24 LAB — FERRITIN: Ferritin: 229.2 ng/mL (ref 10.0–291.0)

## 2020-08-24 LAB — COMPREHENSIVE METABOLIC PANEL
ALT: 19 U/L (ref 0–35)
AST: 56 U/L — ABNORMAL HIGH (ref 0–37)
Albumin: 3.3 g/dL — ABNORMAL LOW (ref 3.5–5.2)
Alkaline Phosphatase: 200 U/L — ABNORMAL HIGH (ref 39–117)
BUN: 17 mg/dL (ref 6–23)
CO2: 23 mEq/L (ref 19–32)
Calcium: 9.1 mg/dL (ref 8.4–10.5)
Chloride: 90 mEq/L — ABNORMAL LOW (ref 96–112)
Creatinine, Ser: 1.24 mg/dL — ABNORMAL HIGH (ref 0.40–1.20)
GFR: 48.28 mL/min — ABNORMAL LOW (ref >60.00)
Glucose, Bld: 255 mg/dL — ABNORMAL HIGH (ref 70–99)
Potassium: 4.4 mEq/L (ref 3.5–5.1)
Sodium: 128 mEq/L — ABNORMAL LOW (ref 135–145)
Total Bilirubin: 1.5 mg/dL — ABNORMAL HIGH (ref 0.2–1.2)
Total Protein: 7.3 g/dL (ref 6.0–8.3)

## 2020-08-24 LAB — VITAMIN D 25 HYDROXY (VIT D DEFICIENCY, FRACTURES): VITD: 43.63 ng/mL (ref 30.00–100.00)

## 2020-08-24 NOTE — Progress Notes (Signed)
Mallory Stewart 60 y.o. 10/21/1960 [id number]  Assessment & Plan:   Encounter Diagnoses  Name Primary?  . Portal hypertension ([place]) Yes  . Abnormal liver CT - possible cirrhosis   . Chronic alcoholic pancreatitis ([place])   . Other ascites      Mallory Stewart may have early cirrhosis but ? If some vascular cause of portal htn - though not evident on CT  Hx of EtOH - denies  intake in past year  Continue current diuretic regimen - adjust depending upon labs  Orders Placed This Encounter  Procedures  . MR LIVER W WO CONTRAST  . Comprehensive metabolic panel  . Anti-smooth muscle antibody, IgG  . Ferritin  . Alpha-1-antitrypsin  . Vitamin D (25 hydroxy)       Subjective:   Chief Complaint: ascites and abnormal liver  HPI 60 yo ww known to me because of chronic alcoholic pancreatiotis not seen in about 1 year - was recently admitted to hospital after multiple falls thought to be vestibular in origin - had abd distention and a 9 L low protein fluid paracentesis. I had her do a CT on 418 and it suggested cirrhosis by hypertrophy of caudate and L lobes, ascites and had variable density of liver parenchyma and Mr suggested. Mallory Stewart is on spironolactone and improved. Sprained  Rankle and is non weight bearing  Says wgt going down.  Acute hepatitis panel was neg ANA and mitochondrial Abs neg  Allergies  Allergen Reactions  . Gluten Meal Other (See Comments)    Stomach distress  . Zocor [Simvastatin] Other (See Comments)    Elevated lfts?   Current Meds  Medication Sig  . ALPRAZolam (XANAX) 0.25 MG tablet Take 1 tablet (0.25 mg total) by mouth daily as needed for anxiety.  Marland Kitchen amitriptyline (ELAVIL) 50 MG tablet Take 1 tablet (50 mg total) by mouth at bedtime.  Marland Kitchen azelastine (OPTIVAR) 0.05 % ophthalmic solution Place 1 drop into both eyes 2 (two) times daily as needed.  . B-D UF III MINI PEN NEEDLES 31G X 5 MM MISC USE 4 TIMES A DAY AS DIRECTED  . Blood Glucose Monitoring Suppl  (ONE TOUCH ULTRA 2) w/Device KIT Use to check blood sugars two times day  . Carbinoxamine Maleate 4 MG TABS Take 2 tablets (8 mg total) by mouth 2 (two) times daily as needed (Allergies).  . cetirizine (ZYRTEC) 10 MG tablet Take 10 mg by mouth at bedtime.  . Cyanocobalamin (B-12) 1000 MCG TBCR Take 2,000 mcg by mouth daily.  . dapagliflozin propanediol (FARXIGA) 10 MG TABS tablet Take 10 mg by mouth daily before breakfast.  . feeding supplement, GLUCERNA SHAKE, (GLUCERNA SHAKE) LIQD Take 237 mLs by mouth 3 (three) times daily between meals.  . furosemide (LASIX) 40 MG tablet Take 1 tablet (40 mg total) by mouth daily.  . insulin glargine, 1 Unit Dial, (TOUJEO SOLOSTAR) 300 UNIT/ML Solostar Pen Inject 30 Units into the skin daily at 2 PM.  . ipratropium (ATROVENT) 0.06 % nasal spray Place 2 sprays into both nostrils 4 (four) times daily as needed (Nasal drainage).  . Lancets (ONETOUCH ULTRASOFT) lancets Use to check blood sugar two times a day.  . losartan (COZAAR) 100 MG tablet Take 1 tablet (100 mg total) by mouth daily.  . magnesium oxide (MAG-OX) 400 (241.3 Mg) MG tablet Take 1 tablet (400 mg total) by mouth daily.  . meclizine (ANTIVERT) 25 MG tablet Take 1 tablet (25 mg total) by mouth 2 (two) times daily as  needed for dizziness.  . metoprolol tartrate (LOPRESSOR) 50 MG tablet TAKE 1 TABLET(50 MG) BY MOUTH TWICE DAILY  . Multiple Vitamin (MULTIVITAMIN WITH MINERALS) TABS tablet Take 1 tablet by mouth daily.  Glory Rosebush ULTRA test strip CHECK BLOOD SUGAR TWO TIMES DAILY AS DIRECTED  . pantoprazole (PROTONIX) 40 MG tablet TAKE 1 TABLET BY MOUTH TWICE DAILY BEFORE A MEAL  . spironolactone (ALDACTONE) 25 MG tablet Take 4 tablets (100 mg total) by mouth daily.  Marland Kitchen thiamine 100 MG tablet Take 1 tablet (100 mg total) by mouth daily.   Past Medical History:  Diagnosis Date  . Alcohol-induced chronic pancreatitis ([place])   . B12 deficiency   . Concussion   . DDD (degenerative disc disease),  cervical   . Diabetes ([place])   . DKA, type 2 ([place])   . Gastric outlet obstruction 08/12/2018  . Hypertension   . Neuropathy   . Seasonal allergies    Past Surgical History:  Procedure Laterality Date  . BIOPSY  01/19/2019   Procedure: BIOPSY;  Surgeon: Rush Landmark Telford Nab., MD;  Location: [facility];  Service: Gastroenterology;;  . CHOLECYSTECTOMY N/A 07/28/2016   Procedure: LAPAROSCOPIC CHOLECYSTECTOMY WITH INTRAOPERATIVE CHOLANGIOGRAM;  Surgeon: Mickeal Skinner, MD;  Location: [facility];  Service: General;  Laterality: N/A;  . ESOPHAGOGASTRODUODENOSCOPY (EGD) WITH PROPOFOL N/A 08/13/2018   Procedure: ESOPHAGOGASTRODUODENOSCOPY (EGD) WITH PROPOFOL;  Surgeon: Irving Copas., MD;  Location: [facility];  Service: Gastroenterology;  Laterality: N/A;  . ESOPHAGOGASTRODUODENOSCOPY (EGD) WITH PROPOFOL N/A 01/19/2019   Procedure: ESOPHAGOGASTRODUODENOSCOPY (EGD) WITH PROPOFOL;  Surgeon: Rush Landmark Telford Nab., MD;  Location: [facility];  Service: Gastroenterology;  Laterality: N/A;  . IR PARACENTESIS  08/08/2020  . LEEP  1990's  . ORIF FOOT FRACTURE  09/2008   L 5th metatarsal  . REFRACTIVE SURGERY  2000  . TONSILLECTOMY    . UPPER ESOPHAGEAL ENDOSCOPIC ULTRASOUND (EUS) N/A 08/13/2018   Procedure: UPPER ESOPHAGEAL ENDOSCOPIC ULTRASOUND (EUS);  Surgeon: Irving Copas., MD;  Location: [facility];  Service: Gastroenterology;  Laterality: N/A;  . 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[employer] ([employer])works from home mostly when Mallory Stewart is not traveling   regular exercise , walking 4-5 times a week   Diet fruits and veggies, water   Alcohol at least several  glasses of wine a week Stopped 08/2018   Never smoker, no drug use   family history includes Atrial fibrillation in Mallory Stewart sister; Brain cancer in Mallory Stewart maternal grandfather; Breast cancer in Mallory Stewart maternal grandmother; Cancer in Mallory Stewart paternal grandfather; Coronary artery disease in Mallory Stewart paternal grandmother; Healthy in Mallory Stewart brother and brother; Heart attack (age of onset: 33) in Mallory Stewart paternal grandmother; Other in Mallory Stewart father and mother; Prostate cancer in Mallory Stewart father; Stroke in Mallory Stewart father.   Review of Systems As above  Objective:   Physical Exam BP 110/64   Pulse 64   Ht 5' 8.5" (1.74 m)   Wt 175 lb (79.4 kg) Comment: Reported  BMI 26.22 kg/m  NAD but in w/c with r ankle sprain/splint Anicteric Alert and oriented x 3 Lungs cta Cor NL s1s2 no rmg abd soft NT + ascites No stigmata of chronic liver dz

## 2020-08-24 NOTE — Patient Instructions (Signed)
Your provider has requested that you go to the basement level for lab work before leaving today. Press "B" on the elevator. The lab is located at the first door on the left as you exit the elevator.  Due to recent changes in healthcare laws, you may see the results of your imaging and laboratory studies on MyChart before your provider has had a chance to review them.  We understand that in some cases there may be results that are confusing or concerning to you. Not all laboratory results come back in the same time frame and the provider may be waiting for multiple results in order to interpret others.  Please give [place] 48 hours in order for your provider to thoroughly review all the results before contacting the office for clarification of your results.   You will be contacted by [facility] in the next 2 days to arrange a MRI liver.  The number on your caller ID will be [phone], please answer when they call.  If you have not heard from them in 2 days please call [phone] to schedule.    Follow up with Dr Leone Payor at the end of May.   I appreciate the opportunity to care for you. Stan Head, MD, [facility]

## 2020-08-26 ENCOUNTER — Other Ambulatory Visit: Payer: Self-pay

## 2020-08-26 ENCOUNTER — Telehealth: Payer: Self-pay | Admitting: Internal Medicine

## 2020-08-26 ENCOUNTER — Ambulatory Visit: Payer: 59 | Admitting: Family Medicine

## 2020-08-26 VITALS — BP 102/70 | HR 83 | Temp 97.5°F | Ht 69.0 in | Wt 175.0 lb

## 2020-08-26 DIAGNOSIS — S93601D Unspecified sprain of right foot, subsequent encounter: Secondary | ICD-10-CM | POA: Insufficient documentation

## 2020-08-26 DIAGNOSIS — S93491D Sprain of other ligament of right ankle, subsequent encounter: Secondary | ICD-10-CM

## 2020-08-26 DIAGNOSIS — S93491A Sprain of other ligament of right ankle, initial encounter: Secondary | ICD-10-CM | POA: Insufficient documentation

## 2020-08-26 DIAGNOSIS — S91114S Laceration without foreign body of right lesser toe(s) without damage to nail, sequela: Secondary | ICD-10-CM

## 2020-08-26 DIAGNOSIS — K766 Portal hypertension: Secondary | ICD-10-CM

## 2020-08-26 DIAGNOSIS — K860 Alcohol-induced chronic pancreatitis: Secondary | ICD-10-CM

## 2020-08-26 MED ORDER — FUROSEMIDE 40 MG PO TABS: 20.0000 mg | ORAL_TABLET | Freq: Every day | ORAL | 0 refills | Status: DC

## 2020-08-26 MED ORDER — SPIRONOLACTONE 25 MG PO TABS: 50.0000 mg | ORAL_TABLET | Freq: Every day | ORAL | 0 refills | Status: DC

## 2020-08-26 NOTE — Patient Instructions (Addendum)
Keep laceration clean and dry.Marland Kitchen apply topical antibiotic and Band-Aid.  Wear ankle brace and post op shoe on right for stability.  Start 3 times daily, foot and ankle rehab exercises as able with balance issues. Okay to use walker, weight bear as long as balance remaining well controlled.

## 2020-08-26 NOTE — Progress Notes (Signed)
Patient ID: Mallory Stewart, female    DOB: 12/13/1960, 60 y.o.   MRN: [id number]  This visit was conducted in person.  BP 102/70   Pulse 83   Temp (!) 97.5 F (36.4 C) (Temporal)   Ht 5\' 9"  (1.753 m)   Wt 175 lb (79.4 kg)   SpO2 99%   BMI 25.84 kg/m    CC:  Chief Complaint  Patient presents with  . Follow-up    1 week- foot injury    Subjective:   HPI: Mallory Stewart is a 60 y.o. female presenting on 08/26/2020 for Follow-up (1 week- foot injury)  Seen 1 week ago with foot and lateral ankle pain on right.08/28/2020 showed no fracture.  Treated with ice, elevation, post op shoe, non-weight bearing Tramadol as needed for pain.  Als with laceration on base on 5th digit.. cleaned at last OV 1 week ago and steri strips applied.  Today she reports decreased swelling, decreased pain. No redness spreading. Keeping wound clean and dry.  Steristrips remain.  She has been able to put weight    Relevant past medical, surgical, family and social history reviewed and updated as indicated. Interim medical history since our last visit reviewed. Allergies and medications reviewed and updated. Outpatient Medications Prior to Visit  Medication Sig Dispense Refill  . ALPRAZolam (XANAX) 0.25 MG tablet Take 1 tablet (0.25 mg total) by mouth daily as needed for anxiety. 20 tablet 0  . amitriptyline (ELAVIL) 50 MG tablet Take 1 tablet (50 mg total) by mouth at bedtime. 30 tablet 3  . azelastine (OPTIVAR) 0.05 % ophthalmic solution Place 1 drop into both eyes 2 (two) times daily as needed.    . B-D UF III MINI PEN NEEDLES 31G X 5 MM MISC USE 4 TIMES A DAY AS DIRECTED 100 each 3  . Blood Glucose Monitoring Suppl (ONE TOUCH ULTRA 2) w/Device KIT Use to check blood sugars two times day 1 each 0  . Carbinoxamine Maleate 4 MG TABS Take 2 tablets (8 mg total) by mouth 2 (two) times daily as needed (Allergies). 28 tablet 5  . cetirizine (ZYRTEC) 10 MG tablet Take 10 mg by mouth at bedtime.     . Cyanocobalamin (B-12) 1000 MCG TBCR Take 2,000 mcg by mouth daily. 30 tablet   . dapagliflozin propanediol (FARXIGA) 10 MG TABS tablet Take 10 mg by mouth daily before breakfast. 90 tablet 3  . feeding supplement, GLUCERNA SHAKE, (GLUCERNA SHAKE) LIQD Take 237 mLs by mouth 3 (three) times daily between meals. 30 mL 0  . furosemide (LASIX) 40 MG tablet Take 1 tablet (40 mg total) by mouth daily. 30 tablet 0  . insulin glargine, 1 Unit Dial, (TOUJEO SOLOSTAR) 300 UNIT/ML Solostar Pen Inject 30 Units into the skin daily at 2 PM.    . ipratropium (ATROVENT) 0.06 % nasal spray Place 2 sprays into both nostrils 4 (four) times daily as needed (Nasal drainage). 15 mL 5  . Lancets (ONETOUCH ULTRASOFT) lancets Use to check blood sugar two times a day. 200 each 3  . losartan (COZAAR) 100 MG tablet Take 1 tablet (100 mg total) by mouth daily. 30 tablet 11  . magnesium oxide (MAG-OX) 400 (241.3 Mg) MG tablet Take 1 tablet (400 mg total) by mouth daily. 30 tablet 0  . meclizine (ANTIVERT) 25 MG tablet Take 1 tablet (25 mg total) by mouth 2 (two) times daily as needed for dizziness. 30 tablet 0  . metoprolol tartrate (LOPRESSOR)  50 MG tablet TAKE 1 TABLET(50 MG) BY MOUTH TWICE DAILY 180 tablet 1  . Multiple Vitamin (MULTIVITAMIN WITH MINERALS) TABS tablet Take 1 tablet by mouth daily.    Glory Rosebush ULTRA test strip CHECK BLOOD SUGAR TWO TIMES DAILY AS DIRECTED 100 strip 7  . pantoprazole (PROTONIX) 40 MG tablet TAKE 1 TABLET BY MOUTH TWICE DAILY BEFORE A MEAL 180 tablet 1  . spironolactone (ALDACTONE) 25 MG tablet Take 4 tablets (100 mg total) by mouth daily. 30 tablet 0  . thiamine 100 MG tablet Take 1 tablet (100 mg total) by mouth daily. 30 tablet 0   No facility-administered medications prior to visit.     Per HPI unless specifically indicated in ROS section below Review of Systems  Constitutional: Negative for fatigue and fever.  HENT: Negative for congestion.   Eyes: Negative for pain.   Respiratory: Negative for cough and shortness of breath.   Cardiovascular: Negative for chest pain, palpitations and leg swelling.  Gastrointestinal: Negative for abdominal pain.  Genitourinary: Negative for dysuria and vaginal bleeding.  Musculoskeletal: Negative for back pain.  Neurological: Negative for syncope, light-headedness and headaches.  Psychiatric/Behavioral: Negative for dysphoric mood.   Objective:  BP 102/70   Pulse 83   Temp (!) 97.5 F (36.4 C) (Temporal)   Ht $R'5\' 9"'wX$  (1.753 m)   Wt 175 lb (79.4 kg)   SpO2 99%   BMI 25.84 kg/m   Wt Readings from Last 3 Encounters:  08/26/20 175 lb (79.4 kg)  08/24/20 175 lb (79.4 kg)  08/11/20 177 lb (80.3 kg)      Physical Exam Constitutional:      General: She is not in acute distress.    Appearance: Normal appearance. She is well-developed. She is not ill-appearing or toxic-appearing.  HENT:     Head: Normocephalic.     Right Ear: Hearing, tympanic membrane, ear canal and external ear normal. Tympanic membrane is not erythematous, retracted or bulging.     Left Ear: Hearing, tympanic membrane, ear canal and external ear normal. Tympanic membrane is not erythematous, retracted or bulging.     Nose: No mucosal edema or rhinorrhea.     Right Sinus: No maxillary sinus tenderness or frontal sinus tenderness.     Left Sinus: No maxillary sinus tenderness or frontal sinus tenderness.     Mouth/Throat:     Pharynx: Uvula midline.  Eyes:     General: Lids are normal. Lids are everted, no foreign bodies appreciated.     Conjunctiva/sclera: Conjunctivae normal.     Pupils: Pupils are equal, round, and reactive to light.  Neck:     Thyroid: No thyroid mass or thyromegaly.     Vascular: No carotid bruit.     Trachea: Trachea normal.  Cardiovascular:     Rate and Rhythm: Normal rate and regular rhythm.     Pulses: Normal pulses.     Heart sounds: Normal heart sounds, S1 normal and S2 normal. No murmur heard. No friction rub. No  gallop.   Pulmonary:     Effort: Pulmonary effort is normal. No tachypnea or respiratory distress.     Breath sounds: Normal breath sounds. No decreased breath sounds, wheezing, rhonchi or rales.  Abdominal:     General: Bowel sounds are normal.     Palpations: Abdomen is soft.     Tenderness: There is no abdominal tenderness.  Musculoskeletal:     Cervical back: Normal range of motion and neck supple.  Feet:  Comments: Decrease in swelling in foot and ankle.  Decreased sensation throughout lower extremities but can feel pain to palpation in dorsal foot and 3,4,5th digits. No pain to palpation in ankle but pain with internal and external rotation.  healing laceration at base of right 5th digit, steristrips no longer present, no erythema, or discharge. Skin:    General: Skin is warm and dry.     Findings: No rash.  Neurological:     Mental Status: She is alert.  Psychiatric:        Mood and Affect: Mood is not anxious or depressed.        Speech: Speech normal.        Behavior: Behavior normal. Behavior is cooperative.        Thought Content: Thought content normal.        Judgment: Judgment normal.       Results for orders placed or performed in visit on 08/24/20  Vitamin D (25 hydroxy)  Result Value Ref Range   VITD 43.63 30.00 - 100.00 ng/mL  Alpha-1-antitrypsin  Result Value Ref Range   A-1 Antitrypsin, Ser 266 (H) 83 - 199 mg/dL  Ferritin  Result Value Ref Range   Ferritin 229.2 10.0 - 291.0 ng/mL  Comprehensive metabolic panel  Result Value Ref Range   Sodium 128 (L) 135 - 145 mEq/L   Potassium 4.4 3.5 - 5.1 mEq/L   Chloride 90 (L) 96 - 112 mEq/L   CO2 23 19 - 32 mEq/L   Glucose, Bld 255 (H) 70 - 99 mg/dL   BUN 17 6 - 23 mg/dL   Creatinine, Ser 1.24 (H) 0.40 - 1.20 mg/dL   Total Bilirubin 1.5 (H) 0.2 - 1.2 mg/dL   Alkaline Phosphatase 200 (H) 39 - 117 U/L   AST 56 (H) 0 - 37 U/L   ALT 19 0 - 35 U/L   Total Protein 7.3 6.0 - 8.3 g/dL   Albumin 3.3 (L) 3.5 -  5.2 g/dL   GFR 48.28 (L) >60.00 mL/min   Calcium 9.1 8.4 - 10.5 mg/dL    This visit occurred during the SARS-CoV-2 public health emergency.  Safety protocols were in place, including screening questions prior to the visit, additional usage of staff PPE, and extensive cleaning of exam room while observing appropriate contact time as indicated for disinfecting solutions.   COVID 19 screen:  No recent travel or known exposure to COVID19 The patient denies respiratory symptoms of COVID 19 at this time. The importance of social distancing was discussed today.   Assessment and Plan    Problem List Items Addressed This Visit    Foot sprain, right, subsequent encounter   Sprain of anterior talofibular ligament of right ankle - Primary    Other Visit Diagnoses    Laceration of lesser toe of right foot without foreign body present or damage to nail, sequela        R for ankle brace given for stability. Continue post op shoe,elevation  And icing prn. Start more regular rehab exercises and able to weight bear  with walker if balance good enough. No S/S infection in laceration... keep clean dry, antibiotic ointment and bandaid.   Eliezer Lofts, MD

## 2020-08-26 NOTE — Telephone Encounter (Signed)
Inbound call from patient returning call about lab results

## 2020-08-26 NOTE — Telephone Encounter (Signed)
Patient will come for labs in 2 weeks. See results notes for details.

## 2020-08-29 ENCOUNTER — Encounter: Payer: Self-pay | Admitting: Physical Therapy

## 2020-08-29 ENCOUNTER — Ambulatory Visit: Payer: 59 | Attending: Internal Medicine | Admitting: Physical Therapy

## 2020-08-29 ENCOUNTER — Other Ambulatory Visit: Payer: Self-pay

## 2020-08-29 ENCOUNTER — Other Ambulatory Visit: Payer: Self-pay | Admitting: Internal Medicine

## 2020-08-29 DIAGNOSIS — H8112 Benign paroxysmal vertigo, left ear: Secondary | ICD-10-CM | POA: Diagnosis present

## 2020-08-29 DIAGNOSIS — R2681 Unsteadiness on feet: Secondary | ICD-10-CM | POA: Insufficient documentation

## 2020-08-29 DIAGNOSIS — R42 Dizziness and giddiness: Secondary | ICD-10-CM | POA: Diagnosis present

## 2020-08-29 LAB — ALPHA-1-ANTITRYPSIN: A-1 Antitrypsin, Ser: 266 mg/dL — ABNORMAL HIGH (ref 83–199)

## 2020-08-29 LAB — ANTI-SMOOTH MUSCLE ANTIBODY, IGG: Actin (Smooth Muscle) Antibody (IGG): <20 U (ref <20)

## 2020-08-29 NOTE — Patient Instructions (Addendum)
  Sit to Side-Lying    Sit on edge of bed. 1. Turn head 45 to right. 2. Maintain head position and lie down slowly on left side. Hold until symptoms subside. 3. Sit up slowly. Hold until symptoms subside. 4. Turn head 45 to left. 5. Maintain head position and lie down slowly on right side. Hold until symptoms subside. 6. Sit up slowly. Repeat sequence _5_ times per session. Do __3__ sessions per day.    Self Treatment for Left Posterior / Anterior Canalithiasis  1-2  Sitting on bed: 1. Turn head 45 left. (a) Lie back slowly, shoulders on pillow, head on bed. (b) Hold _20___ seconds. 2. Keeping head on bed, turn head 90 right. Hold __20__ seconds. 3. Roll to right, head on 45 angle down toward bed. Hold __20__ seconds. 4. Sit up on right side of bed. Repeat __3__ times per session. Do __1-2__ sessions per day.  Copyright  VHI. All rights reserved.

## 2020-08-29 NOTE — Therapy (Signed)
[facility] [address] [place], [place], [address] Phone: [phone]   Fax:  [phone]  Physical Therapy Evaluation  Patient Details  Name: Mallory Stewart MRN: [id number] Date of Birth: 06-Jul-1960 Referring Provider (PT): Albertine Grates, MD (Hospitalist);  Kerby Nora, MD (PCP)   Encounter Date: 08/29/2020   PT End of Session - 08/29/20 2000    Visit Number 1    Number of Visits 4    Date for PT Re-Evaluation 09/23/20    Authorization Type UHC    PT Start Time [phone]    PT Stop Time 1015    PT Time Calculation (min) 39 min    Activity Tolerance Patient tolerated treatment well    Behavior During Therapy [facility] for tasks assessed/performed           Past Medical History:  Diagnosis Date  . Alcohol-induced chronic pancreatitis (HCC)   . B12 deficiency   . Concussion   . DDD (degenerative disc disease), cervical   . Diabetes (HCC)   . DKA, type 2 (HCC)   . Gastric outlet obstruction 08/12/2018  . Hypertension   . Neuropathy   . Seasonal allergies     Past Surgical History:  Procedure Laterality Date  . BIOPSY  01/19/2019   Procedure: BIOPSY;  Surgeon: Meridee Score Netty Starring., MD;  Location: [facility] ENDOSCOPY;  Service: Gastroenterology;;  . CHOLECYSTECTOMY N/A 07/28/2016   Procedure: LAPAROSCOPIC CHOLECYSTECTOMY WITH INTRAOPERATIVE CHOLANGIOGRAM;  Surgeon: Rodman Pickle, MD;  Location: [facility] OR;  Service: General;  Laterality: N/A;  . ESOPHAGOGASTRODUODENOSCOPY (EGD) WITH PROPOFOL N/A 08/13/2018   Procedure: ESOPHAGOGASTRODUODENOSCOPY (EGD) WITH PROPOFOL;  Surgeon: Lemar Lofty., MD;  Location: [facility] ENDOSCOPY;  Service: Gastroenterology;  Laterality: N/A;  . ESOPHAGOGASTRODUODENOSCOPY (EGD) WITH PROPOFOL N/A 01/19/2019   Procedure: ESOPHAGOGASTRODUODENOSCOPY (EGD) WITH PROPOFOL;  Surgeon: Meridee Score Netty Starring., MD;  Location: [facility] ENDOSCOPY;  Service: Gastroenterology;  Laterality: N/A;  . IR PARACENTESIS   08/08/2020  . LEEP  1990's  . ORIF FOOT FRACTURE  09/2008   L 5th metatarsal  . REFRACTIVE SURGERY  2000  . TONSILLECTOMY    . UPPER ESOPHAGEAL ENDOSCOPIC ULTRASOUND (EUS) N/A 08/13/2018   Procedure: UPPER ESOPHAGEAL ENDOSCOPIC ULTRASOUND (EUS);  Surgeon: Lemar Lofty., MD;  Location: [facility] ENDOSCOPY;  Service: Gastroenterology;  Laterality: N/A;  . UPPER ESOPHAGEAL ENDOSCOPIC ULTRASOUND (EUS) N/A 01/19/2019   Procedure: UPPER ESOPHAGEAL ENDOSCOPIC ULTRASOUND (EUS);  Surgeon: Lemar Lofty., MD;  Location: [facility] ENDOSCOPY;  Service: Gastroenterology;  Laterality: N/A;    There were no vitals filed for this visit.    Subjective Assessment - 08/29/20 0941    Subjective Pt reports dizziness started approx. 3-4 months ago ; states it progressively got worse; pt states she has had 5 falls within past month.  Pt states she fell in hotel room in [place]  while on a business trip.  Pt states today is really bad - has not driven in past 4 weeks.   Pt states she always feels a little "off kilter".  States she looked up at her husband this morning and got very dizzy.    Patient Stated Goals Resolve the vertigo    Currently in Pain? No/denies              [facility] PT Assessment - 08/29/20 0001      Assessment   Medical Diagnosis Dizziness:  Generalized weakness    Referring Provider (PT) Albertine Grates, MD (Hospitalist);  Kerby Nora, MD (PCP)    Onset  Date/Surgical Date 08/05/20    Prior Therapy pt was evaluated for vertigo in ED      Precautions   Precautions Fall      Restrictions   Weight Bearing Restrictions No      Balance Screen   Has the patient fallen in the past 6 months Yes    How many times? 5    Has the patient had a decrease in activity level because of a fear of falling?  No    Is the patient reluctant to leave their home because of a fear of falling?  No      Prior Function   Level of Independence Independent      Observation/Other Assessments   Focus on Therapeutic  Outcomes (FOTO)  45/100 (risk adjusted 48/100    Other Surveys  --   DFS 41/100                 Vestibular Assessment - 08/29/20 0001      Symptom Behavior   Subjective history of current problem Pt reports 2 month hx of dizziness, imbalance and numerous falls.  Pt has h/o numbness in feet & lower legs    Type of Dizziness  Spinning    Frequency of Dizziness intermittent    Duration of Dizziness minutes    Aggravating Factors Rolling to left;Looking up to the ceiling    Relieving Factors Lying supine    Progression of Symptoms Better      Positional Testing   Dix-Hallpike Dix-Hallpike Right;Dix-Hallpike Left    Sidelying Test Sidelying Right;Sidelying Left      Dix-Hallpike Right   Dix-Hallpike Right Duration none    Dix-Hallpike Right Symptoms No nystagmus      Dix-Hallpike Left   Dix-Hallpike Left Duration approx. 15 secs    Dix-Hallpike Left Symptoms No nystagmus      Sidelying Right   Sidelying Right Duration none    Sidelying Right Symptoms No nystagmus      Sidelying Left   Sidelying Left Duration approx. 10 secs    Sidelying Left Symptoms No nystagmus      Positional Sensitivities   Up from Right Hallpike Moderate dizziness    Up from Left Hallpike Moderate dizziness    Positional Sensitivities Comments mod dizziness with sidelying to sitting from both sides              Objective measurements completed on examination: See above findings.               PT Education - 08/29/20 1957    Education Details pt instructed in Epley and also in Brandt-Daroff exs    Person(s) Educated Patient    Methods Explanation;Handout;Demonstration    Comprehension Verbalized understanding               PT Long Term Goals - 08/29/20 2044      PT LONG TERM GOAL #1   Title Pt will have a (-) Lt Dix-Hallpike test to indicate resolution of Lt BPPV.    Time 4    Period Weeks    Status New    Target Date 09/23/20      PT LONG TERM GOAL #2   Title  Independent in self treatment exercises (Brandt-Daroff, Epley maneuver) prn should BPPV re-occur.    Time 4    Period Weeks    Status New    Target Date 09/23/20      PT LONG TERM GOAL #3   Title Improve DFS  from 41/100 to >/= 50/100 to demo improvement in symptoms.    Time 4    Period Weeks    Status New    Target Date 09/23/20                  Plan - 08/29/20 2001    Clinical Impression Statement Pt is a 60 yr old lady with c/o vertigo with rolling onto Lt side and also with looking up toward ceiling.  Pt c/o spinning vertigo in Lt Dix-Hallpike test position and also in Lt sidelying position but no nystagmus noted with any positional testing.  Pt was treated with 3 reps of Epley maneuver with report of improvement in symptoms on 3rd rep.  Cont to assess Lt BPPV.    Personal Factors and Comorbidities Behavior Pattern;Comorbidity 2    Comorbidities concussion, peripheral neuropathy, DM, DDD, chronic pancreatitis    Examination-Activity Limitations Bed Mobility;Bend;Locomotion Level;Transfers;Stand;Squat    Examination-Participation Restrictions Meal Prep;Cleaning;Occupation;Community Activity;Driving;Laundry;Shop    Stability/Clinical Decision Making Evolving/Moderate complexity    Clinical Decision Making Moderate    Rehab Potential Good    PT Frequency 1x / week    PT Duration 4 weeks    PT Treatment/Interventions ADLs/Self Care Home Management;Vestibular;Canalith Repostioning;Gait training;Patient/family education;Therapeutic activities;Therapeutic exercise;Balance training;Neuromuscular re-education    PT Next Visit Plan recheck Lt BPPV    Consulted and Agree with Plan of Care Patient           Patient will benefit from skilled therapeutic intervention in order to improve the following deficits and impairments:  Difficulty walking,Dizziness,Decreased balance,Decreased strength  Visit Diagnosis: BPPV (benign paroxysmal positional vertigo), left - Plan: PT plan of care  cert/re-cert  Dizziness and giddiness - Plan: PT plan of care cert/re-cert  Unsteadiness on feet - Plan: PT plan of care cert/re-cert     Problem List Patient Active Problem List   Diagnosis Date Noted  . Foot sprain, right, subsequent encounter 08/26/2020  . Sprain of anterior talofibular ligament of right ankle 08/26/2020  . Splenomegaly 08/09/2020  . Malnutrition of moderate degree (HCC) 08/08/2020  . Hyponatremia 08/06/2020  . Hypomagnesemia 08/06/2020  . Hypoalbuminemia 08/06/2020  . Transaminitis 08/06/2020  . Serum total bilirubin elevated 08/06/2020  . Fall at home, initial encounter 08/06/2020  . Generalized weakness 08/05/2020  . Idiopathic peripheral neuropathy 07/13/2020  . Type 2 diabetes mellitus with diabetic polyneuropathy, with long-term current use of insulin (HCC) 07/13/2020  . Persistent cough for 3 weeks or longer 07/04/2020  . Low HDL (under 40) 06/21/2020  . Post-nasal drip 06/21/2020  . Allergic rhinitis 06/21/2020  . Elevated alkaline phosphatase level 06/21/2020  . Duodenitis - edema ? andgioedema 10/30/2018  . Chronic alcoholic pancreatitis (HCC) 09/02/2018  . Diabetes mellitus with circulatory complication, HTN (HCC) 08/22/2018  . Dizziness 12/26/2017  . Chronic pain 05/17/2017  . Osteoarthritis of spine with radiculopathy, cervical region 09/17/2016  . B12 deficiency 10/21/2014  . Neuropathic pain of both legs 02/11/2014  . Pancreatic pseudocyst/cyst 05/06/2013  . Hepatic steatosis 04/27/2013  . Hyperlipidemia associated with type 2 diabetes mellitus (HCC) 11/03/2008  . Hypertension associated with diabetes (HCC) 09/29/2008  . PAP SMEAR, ABNORMAL 09/29/2008    Kary Kos, PT 08/29/2020, 8:59 PM  [facility] [address] [place], [place], [address] Phone: [phone]   Fax:  [phone]  Name: KINSLEE DALPE MRN: [id number] Date of Birth: [birth year]

## 2020-08-30 ENCOUNTER — Other Ambulatory Visit: Payer: Self-pay | Admitting: Family Medicine

## 2020-08-30 NOTE — Telephone Encounter (Signed)
Last office visit 08/26/2020 follow up foot injury.  Last refilled 06/21/2020 for #20 with no refills.  No future appointments with PCP.

## 2020-09-06 ENCOUNTER — Other Ambulatory Visit: Payer: Self-pay

## 2020-09-06 ENCOUNTER — Encounter: Payer: Self-pay | Admitting: Physical Therapy

## 2020-09-06 ENCOUNTER — Ambulatory Visit (HOSPITAL_COMMUNITY)
Admission: RE | Admit: 2020-09-06 | Discharge: 2020-09-06 | Disposition: A | Discharge status: Home / Self Care | Payer: 59 | Source: Ambulatory Visit | Attending: Internal Medicine | Admitting: Internal Medicine

## 2020-09-06 ENCOUNTER — Ambulatory Visit: Payer: 59 | Attending: Internal Medicine | Admitting: Physical Therapy

## 2020-09-06 DIAGNOSIS — R932 Abnormal findings on diagnostic imaging of liver and biliary tract: Secondary | ICD-10-CM | POA: Diagnosis present

## 2020-09-06 DIAGNOSIS — H8112 Benign paroxysmal vertigo, left ear: Secondary | ICD-10-CM | POA: Diagnosis present

## 2020-09-06 DIAGNOSIS — R2681 Unsteadiness on feet: Secondary | ICD-10-CM | POA: Diagnosis present

## 2020-09-06 DIAGNOSIS — K766 Portal hypertension: Secondary | ICD-10-CM | POA: Diagnosis present

## 2020-09-06 MED ORDER — GADOBUTROL 1 MMOL/ML IV SOLN
8.0000 mL | Freq: Once | INTRAVENOUS | Status: AC | PRN
  Administered 2020-09-06: 8 mL via INTRAVENOUS

## 2020-09-06 NOTE — Patient Instructions (Signed)
SINGLE LIMB STANCE    Stance: single leg on floor. Raise leg. Hold 10___ seconds. Repeat with other leg. _1__ reps per set, __2_ sets per day, _5__ days per week   Feet Together (Compliant Surface) Varied Arm Positions - Eyes Open -- NO PILLOW - STAND ON FLOOR; can do with eyes closed    With eyes open, standing on compliant surface: _FLOOR ONLY_______, feet together and arms out, look at a stationary object. Hold _30___ seconds. Repeat __1_ times per session. Do _1-2___ sessions per day.    Tandem Stance - Partial heel to toe to start     Right foot in front of left, heel touching toe both feet "straight ahead". Stand on Foot Triangle of Support with both feet. Balance in this position _30__ seconds. Do with left foot in front of right.

## 2020-09-07 NOTE — Therapy (Signed)
[facility] [address] [place], [place], [address] Phone: [phone]   Fax:  [phone]  Physical Therapy Treatment  Patient Details  Name: Mallory Stewart MRN: [id number] Date of Birth: Jun 16, 1960 Referring Provider (PT): Albertine Grates, MD (Hospitalist);  Kerby Nora, MD (PCP)   Encounter Date: 09/06/2020   PT End of Session - 09/07/20 1438    Visit Number 2    Number of Visits 4    Date for PT Re-Evaluation 09/23/20    Authorization Type UHC    PT Start Time 0805    PT Stop Time 0846    PT Time Calculation (min) 41 min    Activity Tolerance Patient tolerated treatment well    Behavior During Therapy [facility] for tasks assessed/performed           Past Medical History:  Diagnosis Date  . Alcohol-induced chronic pancreatitis (HCC)   . B12 deficiency   . Concussion   . DDD (degenerative disc disease), cervical   . Diabetes (HCC)   . DKA, type 2 (HCC)   . Gastric outlet obstruction 08/12/2018  . Hypertension   . Neuropathy   . Seasonal allergies     Past Surgical History:  Procedure Laterality Date  . BIOPSY  01/19/2019   Procedure: BIOPSY;  Surgeon: Meridee Score Netty Starring., MD;  Location: [facility] ENDOSCOPY;  Service: Gastroenterology;;  . CHOLECYSTECTOMY N/A 07/28/2016   Procedure: LAPAROSCOPIC CHOLECYSTECTOMY WITH INTRAOPERATIVE CHOLANGIOGRAM;  Surgeon: Rodman Pickle, MD;  Location: [facility] OR;  Service: General;  Laterality: N/A;  . ESOPHAGOGASTRODUODENOSCOPY (EGD) WITH PROPOFOL N/A 08/13/2018   Procedure: ESOPHAGOGASTRODUODENOSCOPY (EGD) WITH PROPOFOL;  Surgeon: Lemar Lofty., MD;  Location: [facility] ENDOSCOPY;  Service: Gastroenterology;  Laterality: N/A;  . ESOPHAGOGASTRODUODENOSCOPY (EGD) WITH PROPOFOL N/A 01/19/2019   Procedure: ESOPHAGOGASTRODUODENOSCOPY (EGD) WITH PROPOFOL;  Surgeon: Meridee Score Netty Starring., MD;  Location: [facility] ENDOSCOPY;  Service: Gastroenterology;  Laterality: N/A;  . IR PARACENTESIS   08/08/2020  . LEEP  1990's  . ORIF FOOT FRACTURE  09/2008   L 5th metatarsal  . REFRACTIVE SURGERY  2000  . TONSILLECTOMY    . UPPER ESOPHAGEAL ENDOSCOPIC ULTRASOUND (EUS) N/A 08/13/2018   Procedure: UPPER ESOPHAGEAL ENDOSCOPIC ULTRASOUND (EUS);  Surgeon: Lemar Lofty., MD;  Location: [facility] ENDOSCOPY;  Service: Gastroenterology;  Laterality: N/A;  . UPPER ESOPHAGEAL ENDOSCOPIC ULTRASOUND (EUS) N/A 01/19/2019   Procedure: UPPER ESOPHAGEAL ENDOSCOPIC ULTRASOUND (EUS);  Surgeon: Lemar Lofty., MD;  Location: [facility] ENDOSCOPY;  Service: Gastroenterology;  Laterality: N/A;    There were no vitals filed for this visit.   Subjective Assessment - 09/06/20 0757    Subjective Pt reports no dizziness yesterday - minimal dizziness this am- pt reports the dizziness is so much better    Patient Stated Goals Resolve the vertigo    Currently in Pain? No/denies                   Vestibular Assessment - 09/07/20 0001      Positional Testing   Dix-Hallpike Dix-Hallpike Left      Sidelying Left   Sidelying Left Duration none    Sidelying Left Symptoms No nystagmus                     Vestibular Treatment/Exercise - 09/07/20 0001      Vestibular Treatment/Exercise   Vestibular Treatment Provided Canalith Repositioning    Canalith Repositioning Epley Manuever Left       EPLEY MANUEVER LEFT   Number  of Reps  2    Overall Response  Improved Symptoms     RESPONSE DETAILS LEFT no nystagmus noted on either rep but pt reported no vertigo in 3rd position on 2nd rep              Balance Exercises - 09/07/20 0001      Balance Exercises: Standing   Standing Eyes Opened Narrow base of support (BOS);Wide (BOA);Head turns;5 reps;Solid surface   horizontal & vertical head turns   Standing Eyes Closed Narrow base of support (BOS);Wide (BOA);Head turns;5 reps;Solid surface   horizontal and vertical head turns   Tandem Stance Eyes open;1 rep;10 secs    SLS Eyes open;Solid  surface;1 rep   10 secs each with UE support prn            PT Education - 09/07/20 1455    Education Details HEP - standing on floor feet together, tandem and SLS    Person(s) Educated Patient    Methods Explanation;Demonstration;Handout    Comprehension Verbalized understanding;Returned demonstration               PT Long Term Goals - 09/07/20 1512      PT LONG TERM GOAL #1   Title Pt will have a (-) Lt Dix-Hallpike test to indicate resolution of Lt BPPV.    Time 4    Period Weeks    Status New      PT LONG TERM GOAL #2   Title Independent in self treatment exercises (Brandt-Daroff, Epley maneuver) prn should BPPV re-occur.    Time 4    Period Weeks    Status New      PT LONG TERM GOAL #3   Title Improve DFS from 41/100 to >/= 50/100 to demo improvement in symptoms.    Time 4    Period Weeks    Status New                 Plan - 09/07/20 1500    Clinical Impression Statement Pt had no nystagmus and no c/o vertigo with 2nd rep Epley maneuver for Lt BPPV - indicative of resolution of Lt BPPV posterior canalithiasis.  Remaining part of session focused on practicing high level balance skills such as tandem stance and SLS; pt needs UE support for safety due to decreased standing balance.  Cont with POC.    Personal Factors and Comorbidities Behavior Pattern;Comorbidity 2    Comorbidities concussion, peripheral neuropathy, DM, DDD, chronic pancreatitis    Examination-Activity Limitations Bed Mobility;Bend;Locomotion Level;Transfers;Stand;Squat    Examination-Participation Restrictions Meal Prep;Cleaning;Occupation;Community Activity;Driving;Laundry;Shop    Stability/Clinical Decision Making Evolving/Moderate complexity    Rehab Potential Good    PT Frequency 1x / week    PT Duration 4 weeks    PT Treatment/Interventions ADLs/Self Care Home Management;Vestibular;Canalith Repostioning;Gait training;Patient/family education;Therapeutic activities;Therapeutic  exercise;Balance training;Neuromuscular re-education    PT Next Visit Plan check HEP - reassess Lt BPPV - high level balance skills    Consulted and Agree with Plan of Care Patient           Patient will benefit from skilled therapeutic intervention in order to improve the following deficits and impairments:  Difficulty walking,Dizziness,Decreased balance,Decreased strength  Visit Diagnosis: BPPV (benign paroxysmal positional vertigo), left  Unsteadiness on feet     Problem List Patient Active Problem List   Diagnosis Date Noted  . Foot sprain, right, subsequent encounter 08/26/2020  . Sprain of anterior talofibular ligament of right ankle 08/26/2020  . Splenomegaly 08/09/2020  .  Malnutrition of moderate degree (HCC) 08/08/2020  . Hyponatremia 08/06/2020  . Hypomagnesemia 08/06/2020  . Hypoalbuminemia 08/06/2020  . Transaminitis 08/06/2020  . Serum total bilirubin elevated 08/06/2020  . Fall at home, initial encounter 08/06/2020  . Generalized weakness 08/05/2020  . Idiopathic peripheral neuropathy 07/13/2020  . Type 2 diabetes mellitus with diabetic polyneuropathy, with long-term current use of insulin (HCC) 07/13/2020  . Persistent cough for 3 weeks or longer 07/04/2020  . Low HDL (under 40) 06/21/2020  . Post-nasal drip 06/21/2020  . Allergic rhinitis 06/21/2020  . Elevated alkaline phosphatase level 06/21/2020  . Duodenitis - edema ? andgioedema 10/30/2018  . Chronic alcoholic pancreatitis (HCC) 09/02/2018  . Diabetes mellitus with circulatory complication, HTN (HCC) 08/22/2018  . Dizziness 12/26/2017  . Chronic pain 05/17/2017  . Osteoarthritis of spine with radiculopathy, cervical region 09/17/2016  . B12 deficiency 10/21/2014  . Neuropathic pain of both legs 02/11/2014  . Pancreatic pseudocyst/cyst 05/06/2013  . Hepatic steatosis 04/27/2013  . Hyperlipidemia associated with type 2 diabetes mellitus (HCC) 11/03/2008  . Hypertension associated with diabetes  (HCC) 09/29/2008  . PAP SMEAR, ABNORMAL 09/29/2008    Kary Kos, PT 09/07/2020, 3:13 PM  [facility] [address] [place], [place], [address] Phone: [phone]   Fax:  [phone]  Name: RIVKA BAUNE MRN: [id number] Date of Birth: 1960/05/20

## 2020-09-12 ENCOUNTER — Telehealth: Payer: Self-pay | Admitting: Family Medicine

## 2020-09-12 ENCOUNTER — Telehealth: Payer: Self-pay

## 2020-09-12 DIAGNOSIS — S82899A Other fracture of unspecified lower leg, initial encounter for closed fracture: Secondary | ICD-10-CM

## 2020-09-12 NOTE — Addendum Note (Signed)
Addended by: Kerby Nora E on: 09/12/2020 07:26 PM   Modules accepted: Orders

## 2020-09-12 NOTE — Telephone Encounter (Signed)
Patient fell and broke her ankle in 3 places. Had to have surgery yesterday in [place].She is currently in the hospital in [place] and she said that her surgeon is requesting that she see a Orthopedic Dr here in [place]. Patient is needing a referral.  Please advise patient. EM

## 2020-09-12 NOTE — Telephone Encounter (Signed)
[facility] Night - Client Nonclinical Telephone Record AccessNurse Client [facility] Night - Client Client Site [facility] - Night Physician Kerby Nora - MD Contact Type Call Who Is Calling Physician / Provider / Hospital Call Type Provider Call [facility] Page Now Reason for Call Request to speak to Physician Initial Comment Caller states she needs a list of medications for a patient. Patient Name Mallory Stewart Patient DOB 03-Jan-1961 Requesting Provider Dr. Evalee [facility] Physician Number [phone] Facility Name [facility] Room Number [facility] 254 Disp. Time Disposition Final User 09/11/2020 11:58:14 AM Send to [facility] Paging Queue Maryjean Morn 09/11/2020 12:02:20 PM Called On-Call Provider Carley Hammed 09/11/2020 12:02:34 PM Paged On Call back to Call Center - PC Carley Hammed 09/11/2020 12:02:50 PM Page Completed Yes Carley Hammed [facility] Phone DateTime Result/Outcome Message Type Notes Luana Shu- DO [phone] 09/11/2020 12:02:20 PM Called On Call Provider - Reached Doctor Paged Luana Shu- DO 09/11/2020 12:03:01 PM Spoke with Office - General Message Result Call Closed By: Carley Hammed Transaction Date/Time: 09/11/2020 11:55:32 AM (ET)

## 2020-09-12 NOTE — Telephone Encounter (Signed)
Referral palced

## 2020-09-14 ENCOUNTER — Other Ambulatory Visit: Payer: Self-pay | Admitting: Family Medicine

## 2020-09-14 ENCOUNTER — Telehealth: Payer: Self-pay | Admitting: Family Medicine

## 2020-09-14 DIAGNOSIS — R296 Repeated falls: Secondary | ICD-10-CM | POA: Insufficient documentation

## 2020-09-14 DIAGNOSIS — S82899A Other fracture of unspecified lower leg, initial encounter for closed fracture: Secondary | ICD-10-CM

## 2020-09-14 DIAGNOSIS — G5793 Unspecified mononeuropathy of bilateral lower limbs: Secondary | ICD-10-CM

## 2020-09-14 NOTE — Telephone Encounter (Signed)
Mrs. Mallory Stewart called in wanted to know about getting a referral for PT due to she fell on Saturday and was discharged from the hospital last night and she has broken 3 bones in her ankle. And wanted to know about her next steps

## 2020-09-14 NOTE — Telephone Encounter (Signed)
Spoke with [name].  She was wanting to see about getting a referral for home PT/OT to help with her recent ankle fracture.  Waiting on Ortho appointment.

## 2020-09-14 NOTE — Telephone Encounter (Signed)
Mallory Stewart was asking for home health.  She has a hospital follow up with you on 09/20/2020.

## 2020-09-14 NOTE — Telephone Encounter (Signed)
I place referral... but it typically needs face to face... I put recent hospitalization and pending appt.  It denied.. work her in ASAP with me or someone else for appt to get home health.

## 2020-09-14 NOTE — Telephone Encounter (Signed)
Patient advised that [facility] office has called to schedule. I provided their office information for patient to call back. Also let patient know details about HH and that Dr Ermalene Searing recommends seen ortho first. Patient agreed and understands the reasoning. Cancelling HH order for now until further evaluation.

## 2020-09-14 NOTE — Telephone Encounter (Signed)
She is home bound? This is home health not regular PT/OT? Don't I need OV? I would recommend waiting until ORTHO exam as also I am not sure recommendations on weight bearing, motion etc.

## 2020-09-16 ENCOUNTER — Ambulatory Visit: Payer: Self-pay

## 2020-09-16 ENCOUNTER — Ambulatory Visit (INDEPENDENT_AMBULATORY_CARE_PROVIDER_SITE_OTHER): Payer: 59 | Admitting: Family

## 2020-09-16 ENCOUNTER — Encounter: Payer: Self-pay | Admitting: Family

## 2020-09-16 DIAGNOSIS — M25571 Pain in right ankle and joints of right foot: Secondary | ICD-10-CM | POA: Diagnosis not present

## 2020-09-16 MED ORDER — OXYCODONE HCL 5 MG PO TABS: 5.0000 mg | ORAL_TABLET | ORAL | 0 refills | Status: DC | PRN

## 2020-09-16 NOTE — Progress Notes (Signed)
Spoke with [name] and cancelled her hospital follow up on 09/20/2020 given she is being seen by Ortho for her ankle fracture.

## 2020-09-16 NOTE — Progress Notes (Signed)
Office Visit Note   Patient: Mallory Stewart           Date of Birth: 02/05/1961           MRN: [id number] Visit Date: 09/16/2020              Requested by: Excell Seltzer, MD [address] [place],  [place] [address] PCP: Excell Seltzer, MD  Chief Complaint  Patient presents with  . Right Ankle - Pain      HPI: The patient is a 60 year old woman who was on vacation at [facility] when unfortunately had a fall and sustained a ankle fracture on the right, with dislocation.  ORIF of the right ankle on Saturday.  Will be obtaining her follow-up care with [place].  She is in a splint today.  Assessment & Plan: Visit Diagnoses:  1. Pain in right ankle and joints of right foot     Plan: Splint removed placed in the cam walker.  She will begin daily Dial soap cleansing.  Dry dressing changes.  Elevate for swelling.  We will refill her pain medication.  She will continue nonweightbearing she will follow-up in 1 more week for staple and suture removal.  Follow-Up Instructions: Return in about 1 week (around 09/23/2020).   Ortho Exam  Patient is alert, oriented, no adenopathy, well-dressed, normal affect, normal respiratory effort. On examination of the right ankle.  On examination of the right ankle significant edema and ecchymosis.  There is no blistering.  Incision intact with staples laterally this is healing well does have medial suture over malleolus as well.  There is no erythema no warmth no sign of infection  Imaging: No results found. No images are attached to the encounter.  Labs: Lab Results  Component Value Date   HGBA1C 5.8 06/16/2020   HGBA1C 7.1 (A) 08/19/2019   HGBA1C 7.0 (H) 10/16/2018   CRP 6.9 07/09/2017   REPTSTATUS 08/09/2020 FINAL 08/08/2020   REPTSTATUS 08/13/2020 FINAL 08/08/2020   GRAMSTAIN  08/08/2020    RARE WBC PRESENT, PREDOMINANTLY MONONUCLEAR NO ORGANISMS SEEN Performed at [facility], [address] [place], [place]  [address]    CULT  08/08/2020    NO GROWTH 5 DAYS Performed at [facility], [address], [place], [place] [address]      Lab Results  Component Value Date   ALBUMIN 3.3 (L) 08/24/2020   ALBUMIN 3.3 (L) 08/18/2020   ALBUMIN 2.5 (L) 08/08/2020    Lab Results  Component Value Date   MG 1.5 08/18/2020   MG 1.8 08/08/2020   MG 1.6 (L) 08/07/2020   Lab Results  Component Value Date   VD25OH 43.63 08/24/2020    No results found for: PREALBUMIN CBC EXTENDED Latest Ref Rng & Units 08/18/2020 08/07/2020 08/06/2020  WBC 4.0 - 10.5 K/uL 9.7 8.0 8.6  RBC 3.87 - 5.11 Mil/uL 3.40(L) 2.89(L) 3.05(L)  HGB 12.0 - 15.0 g/dL 11.3(L) 9.9(L) 10.2(L)  HCT 36.0 - 46.0 % 33.1(L) 27.6(L) 29.0(L)  PLT 150.0 - 400.0 K/uL 335.0 141(L) 136(L)  NEUTROABS 1.7 - 7.7 K/uL - 5.4 -  LYMPHSABS 0.7 - 4.0 K/uL - 1.6 -     There is no height or weight on file to calculate BMI.  Orders:  Orders Placed This Encounter  Procedures  . XR Ankle Complete Right   No orders of the defined types were placed in this encounter.    Procedures: No procedures  performed  Clinical Data: No additional findings.  ROS:  All other systems negative, except as noted in the HPI. Review of Systems  Objective: Vital Signs: There were no vitals taken for this visit.  Specialty Comments:  No specialty comments available.  PMFS History: Patient Active Problem List   Diagnosis Date Noted  . Frequent falls 09/14/2020  . Closed fracture of ankle 09/14/2020  . Foot sprain, right, subsequent encounter 08/26/2020  . Sprain of anterior talofibular ligament of right ankle 08/26/2020  . Splenomegaly 08/09/2020  . Malnutrition of moderate degree (HCC) 08/08/2020  . Hyponatremia 08/06/2020  . Hypomagnesemia 08/06/2020  . Hypoalbuminemia 08/06/2020  . Transaminitis 08/06/2020  . Serum total bilirubin elevated 08/06/2020  . Fall at home, initial encounter 08/06/2020  . Generalized weakness 08/05/2020  . Idiopathic  peripheral neuropathy 07/13/2020  . Type 2 diabetes mellitus with diabetic polyneuropathy, with long-term current use of insulin (HCC) 07/13/2020  . Persistent cough for 3 weeks or longer 07/04/2020  . Low HDL (under 40) 06/21/2020  . Post-nasal drip 06/21/2020  . Allergic rhinitis 06/21/2020  . Elevated alkaline phosphatase level 06/21/2020  . Duodenitis - edema ? andgioedema 10/30/2018  . Chronic alcoholic pancreatitis (HCC) 09/02/2018  . Diabetes mellitus with circulatory complication, HTN (HCC) 08/22/2018  . Dizziness 12/26/2017  . Chronic pain 05/17/2017  . Osteoarthritis of spine with radiculopathy, cervical region 09/17/2016  . B12 deficiency 10/21/2014  . Neuropathic pain of both legs 02/11/2014  . Pancreatic pseudocyst/cyst 05/06/2013  . Hepatic steatosis 04/27/2013  . Hyperlipidemia associated with type 2 diabetes mellitus (HCC) 11/03/2008  . Hypertension associated with diabetes (HCC) 09/29/2008  . PAP SMEAR, ABNORMAL 09/29/2008   Past Medical History:  Diagnosis Date  . Alcohol-induced chronic pancreatitis (HCC)   . B12 deficiency   . Concussion   . DDD (degenerative disc disease), cervical   . Diabetes (HCC)   . DKA, type 2 (HCC)   . Gastric outlet obstruction 08/12/2018  . Hypertension   . Neuropathy   . Seasonal allergies     Family History  Problem Relation Age of Onset  . Other Mother        tachycardia.Marland KitchenMarland Kitchen?afib  . Stroke Father        after hernia suegery  . Prostate cancer Father   . Other Father        global transient amnesia, unclear source  . Atrial fibrillation Sister   . Healthy Brother   . Healthy Brother   . Coronary artery disease Paternal Grandmother   . Heart attack Paternal Grandmother 46  . Brain cancer Maternal Grandfather        ?  Marland Kitchen Cancer Paternal Grandfather        ?  Marland Kitchen Breast cancer Maternal Grandmother     Past Surgical History:  Procedure Laterality Date  . BIOPSY  01/19/2019   Procedure: BIOPSY;  Surgeon: Meridee Score  Netty Starring., MD;  Location: [facility] ENDOSCOPY;  Service: Gastroenterology;;  . CHOLECYSTECTOMY N/A 07/28/2016   Procedure: LAPAROSCOPIC CHOLECYSTECTOMY WITH INTRAOPERATIVE CHOLANGIOGRAM;  Surgeon: Rodman Pickle, MD;  Location: [facility] OR;  Service: General;  Laterality: N/A;  . ESOPHAGOGASTRODUODENOSCOPY (EGD) WITH PROPOFOL N/A 08/13/2018   Procedure: ESOPHAGOGASTRODUODENOSCOPY (EGD) WITH PROPOFOL;  Surgeon: Lemar Lofty., MD;  Location: [facility] ENDOSCOPY;  Service: Gastroenterology;  Laterality: N/A;  . ESOPHAGOGASTRODUODENOSCOPY (EGD) WITH PROPOFOL N/A 01/19/2019   Procedure: ESOPHAGOGASTRODUODENOSCOPY (EGD) WITH PROPOFOL;  Surgeon: Meridee Score Netty Starring., MD;  Location: [facility] ENDOSCOPY;  Service: Gastroenterology;  Laterality: N/A;  . IR PARACENTESIS  08/08/2020  . LEEP  1990's  . ORIF FOOT FRACTURE  09/2008   L 5th metatarsal  . REFRACTIVE SURGERY  2000  . TONSILLECTOMY    . UPPER ESOPHAGEAL ENDOSCOPIC ULTRASOUND (EUS) N/A 08/13/2018   Procedure: UPPER ESOPHAGEAL ENDOSCOPIC ULTRASOUND (EUS);  Surgeon: Lemar Lofty., MD;  Location: [facility] ENDOSCOPY;  Service: Gastroenterology;  Laterality: N/A;  . UPPER ESOPHAGEAL ENDOSCOPIC ULTRASOUND (EUS) N/A 01/19/2019   Procedure: UPPER ESOPHAGEAL ENDOSCOPIC ULTRASOUND (EUS);  Surgeon: Lemar Lofty., MD;  Location: [facility] ENDOSCOPY;  Service: Gastroenterology;  Laterality: N/A;   Social History   Occupational History  . 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 Smoking status: Never Smoker  . Smokeless tobacco: Never Used  Vaping Use  . Vaping Use: Never used  Substance and Sexual Activity  . Alcohol use: Not Currently    Alcohol/week: 7.0 standard drinks    Types: 7 Glasses of wine per week    Comment: wine  . Drug use: No  . Sexual activity: Not on file

## 2020-09-17 DIAGNOSIS — S91114A Laceration without foreign body of right lesser toe(s) without damage to nail, initial encounter: Secondary | ICD-10-CM | POA: Insufficient documentation

## 2020-09-17 DIAGNOSIS — M25571 Pain in right ankle and joints of right foot: Secondary | ICD-10-CM | POA: Insufficient documentation

## 2020-09-17 DIAGNOSIS — M79671 Pain in right foot: Secondary | ICD-10-CM | POA: Insufficient documentation

## 2020-09-17 DIAGNOSIS — R188 Other ascites: Secondary | ICD-10-CM | POA: Insufficient documentation

## 2020-09-17 NOTE — Assessment & Plan Note (Signed)
Discussed home safety and limited weight bearing... use wheelchair or walker for now.

## 2020-09-17 NOTE — Assessment & Plan Note (Signed)
Follow over time. 

## 2020-09-17 NOTE — Assessment & Plan Note (Signed)
Re-eval in office today.

## 2020-09-17 NOTE — Assessment & Plan Note (Signed)
Concerning for early cirrhosis... has  appt with GI scheduled.

## 2020-09-17 NOTE — Assessment & Plan Note (Signed)
Encouraged po intake.  Albumin is low.

## 2020-09-17 NOTE — Assessment & Plan Note (Signed)
Mallory Stewart due to new dx of possible liver disease.

## 2020-09-17 NOTE — Assessment & Plan Note (Signed)
X-ray showed no fracture in ankle or foot. Likely sprain.Marland Kitchen treat with ice , elevation and tramadol for pain

## 2020-09-17 NOTE — Assessment & Plan Note (Signed)
Likely doot sprain.Marland Kitchen treat with tramadol for pain, ice, elevation and post op shoe. Close follow up.  Limit weight bearing.

## 2020-09-17 NOTE — Assessment & Plan Note (Signed)
Area irrigated and cleaned with saline and laceration closed with tissue glue and steri strips. No complications.

## 2020-09-19 ENCOUNTER — Other Ambulatory Visit: Payer: Self-pay | Admitting: Family Medicine

## 2020-09-19 ENCOUNTER — Other Ambulatory Visit: Payer: Self-pay | Admitting: Internal Medicine

## 2020-09-20 ENCOUNTER — Inpatient Hospital Stay: Payer: 59 | Admitting: Family Medicine

## 2020-09-20 ENCOUNTER — Ambulatory Visit: Payer: 59 | Admitting: Internal Medicine

## 2020-09-23 ENCOUNTER — Ambulatory Visit (INDEPENDENT_AMBULATORY_CARE_PROVIDER_SITE_OTHER): Payer: 59 | Admitting: Family

## 2020-09-23 ENCOUNTER — Other Ambulatory Visit (INDEPENDENT_AMBULATORY_CARE_PROVIDER_SITE_OTHER): Payer: 59

## 2020-09-23 ENCOUNTER — Encounter: Payer: Self-pay | Admitting: Family

## 2020-09-23 DIAGNOSIS — K766 Portal hypertension: Secondary | ICD-10-CM

## 2020-09-23 DIAGNOSIS — S82891D Other fracture of right lower leg, subsequent encounter for closed fracture with routine healing: Secondary | ICD-10-CM | POA: Diagnosis not present

## 2020-09-23 DIAGNOSIS — K860 Alcohol-induced chronic pancreatitis: Secondary | ICD-10-CM

## 2020-09-23 LAB — BASIC METABOLIC PANEL
BUN: 7 mg/dL (ref 6–23)
CO2: 26 mEq/L (ref 19–32)
Calcium: 8.7 mg/dL (ref 8.4–10.5)
Chloride: 96 mEq/L (ref 96–112)
Creatinine, Ser: 0.93 mg/dL (ref 0.40–1.20)
GFR: 68.15 mL/min (ref >60.00)
Glucose, Bld: 162 mg/dL — ABNORMAL HIGH (ref 70–99)
Potassium: 4.2 mEq/L (ref 3.5–5.1)
Sodium: 133 mEq/L — ABNORMAL LOW (ref 135–145)

## 2020-09-23 MED ORDER — OXYCODONE HCL 5 MG PO TABS: 5.0000 mg | ORAL_TABLET | Freq: Four times a day (QID) | ORAL | 0 refills | Status: DC | PRN

## 2020-09-23 NOTE — Progress Notes (Signed)
Post-Op Visit Note   Patient: Mallory Stewart           Date of Birth: 11-Aug-1960           MRN: [id number] Visit Date: 09/23/2020 PCP: Excell Seltzer, MD  Chief Complaint:  Chief Complaint  Patient presents with  . Right Ankle - Follow-up    HPI:  HPI The patient is a 60 year old woman seen in post operative follow up for right ankle fracture with dislocation. Has been nonweight bearing. In cam walker.   Ortho Exam Moderate swelling of foot and ankle. Incisions are well approximated with staples and sutures. Healing well. No drainage, surrounding erythema or sign of infection.  Visit Diagnoses: No diagnosis found.  Plan: staples and suture harvested. Vive crew sock applied for compression. Continue caM. nonweight bearing. Follow up ini 2 weeks with radiographs of right ankle Follow-Up Instructions: Return in about 13 days (around 10/06/2020).   Imaging: No results found.  Orders:  No orders of the defined types were placed in this encounter.  Meds ordered this encounter  Medications  . oxyCODONE (OXY IR/ROXICODONE) 5 MG immediate release tablet    Sig: Take 1 tablet (5 mg total) by mouth every 6 (six) hours as needed.    Dispense:  30 tablet    Refill:  0     PMFS History: Patient Active Problem List   Diagnosis Date Noted  . abdominal acites 09/17/2020  . Right foot pain 09/17/2020  . Laceration of lesser toe of right foot without foreign body present or damage to nail 09/17/2020  . Acute right ankle pain 09/17/2020  . Frequent falls 09/14/2020  . Closed fracture of ankle 09/14/2020  . Foot sprain, right, subsequent encounter 08/26/2020  . Sprain of anterior talofibular ligament of right ankle 08/26/2020  . Splenomegaly 08/09/2020  . Malnutrition of moderate degree (HCC) 08/08/2020  . Hyponatremia 08/06/2020  . Hypomagnesemia 08/06/2020  . Hypoalbuminemia 08/06/2020  . Transaminitis 08/06/2020  . Serum total bilirubin elevated 08/06/2020  . Fall at  home, initial encounter 08/06/2020  . Generalized weakness 08/05/2020  . Idiopathic peripheral neuropathy 07/13/2020  . Type 2 diabetes mellitus with diabetic polyneuropathy, with long-term current use of insulin (HCC) 07/13/2020  . Persistent cough for 3 weeks or longer 07/04/2020  . Low HDL (under 40) 06/21/2020  . Post-nasal drip 06/21/2020  . Allergic rhinitis 06/21/2020  . Elevated alkaline phosphatase level 06/21/2020  . Duodenitis - edema ? andgioedema 10/30/2018  . Chronic alcoholic pancreatitis (HCC) 09/02/2018  . Diabetes mellitus with circulatory complication, HTN (HCC) 08/22/2018  . Dizziness 12/26/2017  . Chronic pain 05/17/2017  . Osteoarthritis of spine with radiculopathy, cervical region 09/17/2016  . B12 deficiency 10/21/2014  . Neuropathic pain of both legs 02/11/2014  . Pancreatic pseudocyst/cyst 05/06/2013  . Hepatic steatosis 04/27/2013  . Hyperlipidemia associated with type 2 diabetes mellitus (HCC) 11/03/2008  . Hypertension associated with diabetes (HCC) 09/29/2008  . PAP SMEAR, ABNORMAL 09/29/2008   Past Medical History:  Diagnosis Date  . Alcohol-induced chronic pancreatitis (HCC)   . B12 deficiency   . Concussion   . DDD (degenerative disc disease), cervical   . Diabetes (HCC)   . DKA, type 2 (HCC)   . Gastric outlet obstruction 08/12/2018  . Hypertension   . Neuropathy   . Seasonal allergies     Family History  Problem Relation Age of Onset  . Other Mother        tachycardia.Marland KitchenMarland Kitchen?afib  . Stroke Father  after hernia suegery  . Prostate cancer Father   . Other Father        global transient amnesia, unclear source  . Atrial fibrillation Sister   . Healthy Brother   . Healthy Brother   . Coronary artery disease Paternal Grandmother   . Heart attack Paternal Grandmother 42  . Brain cancer Maternal Grandfather        ?  Marland Kitchen Cancer Paternal Grandfather        ?  Marland Kitchen Breast cancer Maternal Grandmother     Past Surgical History:  Procedure  Laterality Date  . BIOPSY  01/19/2019   Procedure: BIOPSY;  Surgeon: Meridee Score Netty Starring., MD;  Location: [facility] ENDOSCOPY;  Service: Gastroenterology;;  . CHOLECYSTECTOMY N/A 07/28/2016   Procedure: LAPAROSCOPIC CHOLECYSTECTOMY WITH INTRAOPERATIVE CHOLANGIOGRAM;  Surgeon: Rodman Pickle, MD;  Location: [facility] OR;  Service: General;  Laterality: N/A;  . ESOPHAGOGASTRODUODENOSCOPY (EGD) WITH PROPOFOL N/A 08/13/2018   Procedure: ESOPHAGOGASTRODUODENOSCOPY (EGD) WITH PROPOFOL;  Surgeon: Lemar Lofty., MD;  Location: [facility] ENDOSCOPY;  Service: Gastroenterology;  Laterality: N/A;  . ESOPHAGOGASTRODUODENOSCOPY (EGD) WITH PROPOFOL N/A 01/19/2019   Procedure: ESOPHAGOGASTRODUODENOSCOPY (EGD) WITH PROPOFOL;  Surgeon: Meridee Score Netty Starring., MD;  Location[facility] ENDOSCOPY;  Service: Gastroenterology;  Laterality: N/A;  . IR PARACENTESIS  08/08/2020  . LEEP  1990's  . ORIF FOOT FRACTURE  09/2008   L 5th metatarsal  . REFRACTIVE SURGERY  2000  . TONSILLECTOMY    . UPPER ESOPHAGEAL ENDOSCOPIC ULTRASOUND (EUS) N/A 08/13/2018   Procedure: UPPER ESOPHAGEAL ENDOSCOPIC ULTRASOUND (EUS);  Surgeon: Lemar Lofty., MD;  Location: [facility] ENDOSCOPY;  Service: Gastroenterology;  Laterality: N/A;  . UPPER ESOPHAGEAL ENDOSCOPIC ULTRASOUND (EUS) N/A 01/19/2019   Procedure: UPPER ESOPHAGEAL ENDOSCOPIC ULTRASOUND (EUS);  Surgeon: Lemar Lofty., MD;  Location: [facility] ENDOSCOPY;  Service: Gastroenterology;  Laterality: N/A;   Social History   Occupational History  . 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 Smoking status: Never Smoker  . Smokeless tobacco: Never Used  Vaping Use  . Vaping Use: Never used  Substance and Sexual Activity  . Alcohol use: Not Currently    Alcohol/week: 7.0 standard drinks    Types: 7 Glasses of wine per week    Comment: wine  . Drug use: No  . Sexual activity: Not on file

## 2020-09-27 ENCOUNTER — Ambulatory Visit: Payer: 59 | Admitting: Physical Therapy

## 2020-09-28 ENCOUNTER — Telehealth: Payer: Self-pay | Admitting: Orthopedic Surgery

## 2020-09-28 NOTE — Telephone Encounter (Signed)
Flor with PT called to get verbal orders approved; there was a frequency of once a week for 4 weeks and Flor also wanted to know if the pt could start on ROM for her R ankle?   Flor CB# [phone]

## 2020-09-29 ENCOUNTER — Ambulatory Visit (INDEPENDENT_AMBULATORY_CARE_PROVIDER_SITE_OTHER): Payer: 59 | Admitting: Internal Medicine

## 2020-09-29 ENCOUNTER — Other Ambulatory Visit (INDEPENDENT_AMBULATORY_CARE_PROVIDER_SITE_OTHER): Payer: 59

## 2020-09-29 ENCOUNTER — Telehealth: Payer: Self-pay | Admitting: Internal Medicine

## 2020-09-29 ENCOUNTER — Encounter: Payer: Self-pay | Admitting: Internal Medicine

## 2020-09-29 ENCOUNTER — Telehealth: Payer: Self-pay

## 2020-09-29 VITALS — BP 102/64 | HR 70 | Ht 69.0 in

## 2020-09-29 DIAGNOSIS — K766 Portal hypertension: Secondary | ICD-10-CM

## 2020-09-29 DIAGNOSIS — K7031 Alcoholic cirrhosis of liver with ascites: Secondary | ICD-10-CM | POA: Diagnosis not present

## 2020-09-29 DIAGNOSIS — K860 Alcohol-induced chronic pancreatitis: Secondary | ICD-10-CM

## 2020-09-29 DIAGNOSIS — Z13820 Encounter for screening for osteoporosis: Secondary | ICD-10-CM | POA: Diagnosis not present

## 2020-09-29 DIAGNOSIS — K838 Other specified diseases of biliary tract: Secondary | ICD-10-CM

## 2020-09-29 LAB — CBC WITH DIFFERENTIAL/PLATELET
Basophils Absolute: 0.1 10*3/uL (ref 0.0–0.1)
Basophils Relative: 0.9 % (ref 0.0–3.0)
Eosinophils Absolute: 0.3 10*3/uL (ref 0.0–0.7)
Eosinophils Relative: 4.3 % (ref 0.0–5.0)
HCT: 34.1 % — ABNORMAL LOW (ref 36.0–46.0)
Hemoglobin: 11.4 g/dL — ABNORMAL LOW (ref 12.0–15.0)
Lymphocytes Relative: 19 % (ref 12.0–46.0)
Lymphs Abs: 1.4 10*3/uL (ref 0.7–4.0)
MCHC: 33.6 g/dL (ref 30.0–36.0)
MCV: 94.4 fl (ref 78.0–100.0)
Monocytes Absolute: 0.4 10*3/uL (ref 0.1–1.0)
Monocytes Relative: 5.9 % (ref 3.0–12.0)
Neutro Abs: 5.2 10*3/uL (ref 1.4–7.7)
Neutrophils Relative %: 69.9 % (ref 43.0–77.0)
Platelets: 267 10*3/uL (ref 150.0–400.0)
RBC: 3.61 Mil/uL — ABNORMAL LOW (ref 3.87–5.11)
RDW: 14 % (ref 11.5–15.5)
WBC: 7.4 10*3/uL (ref 4.0–10.5)

## 2020-09-29 LAB — PROTIME-INR
INR: 1.5 ratio — ABNORMAL HIGH (ref 0.8–1.0)
Prothrombin Time: 16.7 s — ABNORMAL HIGH (ref 9.6–13.1)

## 2020-09-29 MED ORDER — PNEUMOVAX 23 25 MCG/0.5ML IJ INJ: 0.5000 mL | INJECTION | Freq: Once | INTRAMUSCULAR | 0 refills | Status: AC

## 2020-09-29 MED ORDER — TETANUS-DIPHTH-ACELL PERTUSSIS 5-2.5-18.5 LF-MCG/0.5 IM SUSY: 0.5000 mL | PREFILLED_SYRINGE | Freq: Once | INTRAMUSCULAR | 0 refills | Status: AC

## 2020-09-29 NOTE — Patient Instructions (Addendum)
Your provider has requested that you go to the basement level for lab work before leaving today. Press "B" on the elevator. The lab is located at the first door on the left as you exit the elevator.  You have been scheduled for an endoscopy. Please follow written instructions given to you at your visit today. If you use inhalers (even only as needed), please bring them with you on the day of your procedure.  Due to recent changes in healthcare laws, you may see the results of your imaging and laboratory studies on MyChart before your provider has had a chance to review them.  We understand that in some cases there may be results that are confusing or concerning to you. Not all laboratory results come back in the same time frame and the provider may be waiting for multiple results in order to interpret others.  Please give [place] 48 hours in order for your provider to thoroughly review all the results before contacting the office for clarification of your results.   Use a 2 gram low sodium diet -handout provided.  We are provided you with rx's to take to the pharmacy to get your Tdap and pneumovax vaccines.  We have ordered a DEXA (Bone Density) test for you. That is done in our basement. I will call you with the date and time.  You have been scheduled for a bone density test on 10/12/20 at 9:30am. Please arrive 15 minutes prior to your scheduled appointment to radiology on the basement floor of [facility] location for this test. If you need to cancel or reschedule for any reason, please contact radiology at [phone].  Preparation for test is as follows:   If you are taking calcium, discontinue this 24-48 hours prior to your appointment.   Wear pants with an elastic waistband (or without any metal such as a zipper).   Do not wear an underwire bra.   We do have gowns if you are unable to find appropriate clothing without metal.   Please bring a list of all current  medications.  I appreciate the opportunity to care for you. Stan Head, MD, [facility]

## 2020-09-29 NOTE — Telephone Encounter (Signed)
Please advise Sir? Thank you. 

## 2020-09-29 NOTE — Telephone Encounter (Signed)
I spoke with the home nurse Dois Davenport and she said when she went out today and they were going over her meds that she has not had the furosemide or spironolactone since the hospital. She didn't have refills. Please advise Sir, thank you.

## 2020-09-29 NOTE — Telephone Encounter (Signed)
Pt was in the office on Wednesday please see below and advise.

## 2020-09-29 NOTE — Telephone Encounter (Signed)
According to my discussion with the patient today in her medication reconciliation she is on furosemide and spironolactone so I do not understand.  You can ask the home nurse how she knows she is not on diuretics.

## 2020-09-29 NOTE — Telephone Encounter (Signed)
Pt's home care nurse called because she seen Dr. Leone Payor on 09/29/20 and stated the she is not on any diuretics and hasn't been on any since she's been home from the hospital. She needed know what to do about this. Her name is Dois Davenport and a good contact is [phone]. Please give her a call. Thank you

## 2020-09-29 NOTE — Telephone Encounter (Signed)
Isaiah Serge, [facility] called wanting to let you know that she saw patient today and that patient's incision looks good and that patient is not wearing with compression sock that she was given.  Cb# [phone].  Please advise.  Thank you.

## 2020-09-29 NOTE — Progress Notes (Signed)
Mallory Stewart 60 y.o. 08/11/1960 [id number]  Assessment & Plan:   Alcoholic cirrhosis of liver with ascites ([place]) Clinically much better.  When she was here we reviewed and I thought she was on furosemide 20 mg daily and spironolactone 50 mg daily.  Follow-up after that with a home health nurse indicates the patient ran out of these at some point and never had refills after the hospitalization.  She seems to be doing well regarding ascites and fluid and I suspect she may have had some component of alcoholic hepatitis along with her hospitalization a while back so we will leave her off these.  2 g sodium diet is recommended. Needs an EGD to look for varices.  Orders Placed This Encounter  Procedures  . DG BONE DENSITY (DXA)  . Hepatitis A antibody, total  . Hepatitis B surface antibody,qualitative  . CBC with Differential/Platelet  . Protime-INR  . Ambulatory referral to Gastroenterology      Chronic alcoholic pancreatitis ([place]) Stable without deterioration at this time.  She understands the need for abstinence for this and her liver.  Tends towards constipation and I do not think she has EPI at this point.  She is a diabetic that may have preceded pancreatitis.  Probably a type II on insulin.  Dilated bile duct Will monitor this at this time.  I favor post cholecystectomy dilation.   Meds ordered this encounter  Medications  . pneumococcal 23 valent vaccine (PNEUMOVAX 23) 25 MCG/0.5ML injection    Sig: Inject 0.5 mLs into the muscle once for 1 dose.    Dispense:  2.5 mL    Refill:  0  . Tdap (BOOSTRIX) 5-2.5-18.5 LF-MCG/0.5 injection    Sig: Inject 0.5 mLs into the muscle once for 1 dose.    Dispense:  0.5 mL    Refill:  0     Subjective:   Chief Complaint: Follow-up of cirrhosis and pancreatitis  HPI Mallory Stewart is a 60 year old woman with chronic alcoholic pancreatitis and suspected portal hypertension and abnormal liver on imaging here for follow-up after MRI  09/06/2020 demonstrated the following:  IMPRESSION: 1. The hepatic heterogeneity seen on CT is due to a combination of cirrhosis and geographic steatosis. No specific worrisome focal liver lesion is identified. 2. There is some mild intrahepatic biliary dilatation along with fusiform dilatation of the common bile duct, which could be a physiologic response to cholecystectomy although a type 1 choledochocele is not excluded. 3. Atrophic pancreas. I suspect that the edema around the pancreas is simply related to background ascites and edema rather than necessarily being from acute pancreatitis although correlation with lipase levels might be warranted. 4. Borderline prominence of the spleen. 5. Gastric varices along the gastric cardia, arising from the left renal vein, compatible with portal venous hypertension. 6.  Aortic Atherosclerosis (ICD10-I70.0).   She was seen in early April and had completion of a serologic work-up for liver disease that was negative and I adjusted her diuretics to increase spironolactone and added furosemide and then these follow-up labs showed some hyponatremia and increasing creatinine so I adjusted again and reduced the dose with normalization for nearly so of those labs.  She reports she is feeling well.  She is still in a wheelchair here today as she is recovering from a fracture of the ankle.  She is wondering about a DEXA scan.  She says she continues to be abstinent from alcohol.  She mentions again that "I never really drink that much". Lab Results  Component Value Date   CREATININE 0.93 09/23/2020   BUN 7 09/23/2020   NA 133 (L) 09/23/2020   K 4.2 09/23/2020   CL 96 09/23/2020   CO2 26 09/23/2020    Wt Readings from Last 3 Encounters:  08/26/20 175 lb (79.4 kg)  08/24/20 175 lb (79.4 kg)  08/11/20 177 lb (80.3 kg)     Allergies  Allergen Reactions  . Gluten Meal Other (See Comments)    Stomach distress  . Zocor [Simvastatin] Other (See  Comments)    Elevated lfts?   Current Meds  Medication Sig  . ALPRAZolam (XANAX) 0.25 MG tablet TAKE 1 TABLET(0.25 MG) BY MOUTH DAILY AS NEEDED FOR ANXIETY  . amitriptyline (ELAVIL) 50 MG tablet Take 1 tablet (50 mg total) by mouth at bedtime.  Marland Kitchen azelastine (OPTIVAR) 0.05 % ophthalmic solution Place 1 drop into both eyes 2 (two) times daily as needed.  . B-D UF III MINI PEN NEEDLES 31G X 5 MM MISC USE 4 TIMES A DAY AS DIRECTED  . Blood Glucose Monitoring Suppl (ONE TOUCH ULTRA 2) w/Device KIT Use to check blood sugars two times day  . Carbinoxamine Maleate 4 MG TABS Take 2 tablets (8 mg total) by mouth 2 (two) times daily as needed (Allergies).  . cetirizine (ZYRTEC) 10 MG tablet Take 10 mg by mouth at bedtime.  . Cyanocobalamin (B-12) 1000 MCG TBCR Take 2,000 mcg by mouth daily.  . dapagliflozin propanediol (FARXIGA) 10 MG TABS tablet Take 10 mg by mouth daily before breakfast.  . feeding supplement, GLUCERNA SHAKE, (GLUCERNA SHAKE) LIQD Take 237 mLs by mouth 3 (three) times daily between meals.  . insulin glargine, 1 Unit Dial, (TOUJEO SOLOSTAR) 300 UNIT/ML Solostar Pen Inject 30 Units into the skin daily at 2 PM.  . ipratropium (ATROVENT) 0.06 % nasal spray Place 2 sprays into both nostrils 4 (four) times daily as needed (Nasal drainage).  . Lancets (ONETOUCH ULTRASOFT) lancets Use to check blood sugar two times a day.  . losartan (COZAAR) 100 MG tablet Take 1 tablet (100 mg total) by mouth daily.  . magnesium oxide (MAG-OX) 400 (241.3 Mg) MG tablet Take 1 tablet (400 mg total) by mouth daily.  . meclizine (ANTIVERT) 25 MG tablet Take 1 tablet (25 mg total) by mouth 2 (two) times daily as needed for dizziness.  . metoprolol tartrate (LOPRESSOR) 50 MG tablet TAKE 1 TABLET(50 MG) BY MOUTH TWICE DAILY  . Multiple Vitamin (MULTIVITAMIN WITH MINERALS) TABS tablet Take 1 tablet by mouth daily.  Glory Rosebush ULTRA test strip CHECK BLOOD SUGAR TWO TIMES DAILY AS DIRECTED  . oxyCODONE (OXY  IR/ROXICODONE) 5 MG immediate release tablet Take 1 tablet (5 mg total) by mouth every 6 (six) hours as needed.  . pantoprazole (PROTONIX) 40 MG tablet TAKE 1 TABLET BY MOUTH TWICE DAILY BEFORE A MEAL  . [EXPIRED] pneumococcal 23 valent vaccine (PNEUMOVAX 23) 25 MCG/0.5ML injection Inject 0.5 mLs into the muscle once for 1 dose.  . [EXPIRED] Tdap (BOOSTRIX) 5-2.5-18.5 LF-MCG/0.5 injection Inject 0.5 mLs into the muscle once for 1 dose.  . thiamine 100 MG tablet Take 1 tablet (100 mg total) by mouth daily.  . [DISCONTINUED] furosemide (LASIX) 40 MG tablet Take 0.5 tablets (20 mg total) by mouth daily.  . [DISCONTINUED] spironolactone (ALDACTONE) 25 MG tablet Take 2 tablets (50 mg total) by mouth daily.   Past Medical History:  Diagnosis Date  . Alcohol-induced chronic pancreatitis ([place])   . B12 deficiency   . Concussion   .  DDD (degenerative disc disease), cervical   . Diabetes ([place])   . DKA, type 2 ([place])   . Gastric outlet obstruction 08/12/2018  . Hypertension   . Neuropathy   . Pancreatic pseudocyst/cyst 05/06/2013  . Seasonal allergies    Past Surgical History:  Procedure Laterality Date  . BIOPSY  01/19/2019   Procedure: BIOPSY;  Surgeon: Rush Landmark Telford Nab., MD;  Location: [place];  Service: Gastroenterology;;  . CHOLECYSTECTOMY N/A 07/28/2016   Procedure: LAPAROSCOPIC CHOLECYSTECTOMY WITH INTRAOPERATIVE CHOLANGIOGRAM;  Surgeon: Mickeal Skinner, MD;  Location: [place];  Service: General;  Laterality: N/A;  . ESOPHAGOGASTRODUODENOSCOPY (EGD) WITH PROPOFOL N/A 08/13/2018   Procedure: ESOPHAGOGASTRODUODENOSCOPY (EGD) WITH PROPOFOL;  Surgeon: Irving Copas., MD;  Location: [place];  Service: Gastroenterology;  Laterality: N/A;  . ESOPHAGOGASTRODUODENOSCOPY (EGD) WITH PROPOFOL N/A 01/19/2019   Procedure: ESOPHAGOGASTRODUODENOSCOPY (EGD) WITH PROPOFOL;  Surgeon: Rush Landmark Telford Nab., MD;  Location: [place];  Service: Gastroenterology;  Laterality: N/A;  . IR  PARACENTESIS  08/08/2020  . LEEP  1990's  . ORIF FOOT FRACTURE  09/2008   L 5th metatarsal  . REFRACTIVE SURGERY  2000  . TONSILLECTOMY    . UPPER ESOPHAGEAL ENDOSCOPIC ULTRASOUND (EUS) N/A 08/13/2018   Procedure: UPPER ESOPHAGEAL ENDOSCOPIC ULTRASOUND (EUS);  Surgeon: Irving Copas., MD;  Location: [place];  Service: Gastroenterology;  Laterality: N/A;  . 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 not traveling   regular exercise , walking 4-5 times a week   Diet fruits and veggies, water   Alcohol at least several glasses of wine a week Stopped 08/2018   Never smoker, no drug use   family history includes Atrial fibrillation in her sister; Brain cancer in her maternal grandfather; Breast cancer in her maternal grandmother; Cancer in her paternal grandfather; Coronary artery disease in her paternal grandmother; Healthy in her brother and brother; Heart attack (age of onset: 75) in her paternal grandmother; Other in her father and mother; Prostate cancer in her father; Stroke in her father.   Review of Systems As per HPI  Objective:   Physical Exam BP 102/64   Pulse 70   Ht _0  (1.753 m)   BMI 25.84 kg/m  Middle-aged white woman in no acute distress in a wheelchair with a boot on the right ankle Eyes anicteric Lungs clear Normal heart sounds Abdomen soft nontender and nondistended

## 2020-09-30 ENCOUNTER — Encounter: Payer: Self-pay | Admitting: Internal Medicine

## 2020-09-30 DIAGNOSIS — K766 Portal hypertension: Secondary | ICD-10-CM | POA: Insufficient documentation

## 2020-09-30 DIAGNOSIS — K838 Other specified diseases of biliary tract: Secondary | ICD-10-CM | POA: Insufficient documentation

## 2020-09-30 LAB — HEPATITIS B SURFACE ANTIBODY,QUALITATIVE: Hep B S Ab: REACTIVE — AB

## 2020-09-30 LAB — HEPATITIS A ANTIBODY, TOTAL: Hepatitis A AB,Total: NONREACTIVE

## 2020-09-30 NOTE — Telephone Encounter (Signed)
She seems ok off them so will take off list If she gets swelling in abdomen or extremities she should let me kniow

## 2020-09-30 NOTE — Assessment & Plan Note (Addendum)
Stable without deterioration at this time.  She understands the need for abstinence for this and her liver.  Tends towards constipation and I do not think she has EPI at this point.  She is a diabetic that may have preceded pancreatitis.  Probably a type II on insulin.

## 2020-09-30 NOTE — Assessment & Plan Note (Addendum)
Clinically much better.  When she was here we reviewed and I thought she was on furosemide 20 mg daily and spironolactone 50 mg daily.  Follow-up after that with a home health nurse indicates the patient ran out of these at some point and never had refills after the hospitalization.  She seems to be doing well regarding ascites and fluid and I suspect she may have had some component of alcoholic hepatitis along with her hospitalization a while back so we will leave her off these.  2 g sodium diet is recommended. Needs an EGD to look for varices.  Orders Placed This Encounter  Procedures  . DG BONE DENSITY (DXA)  . Hepatitis A antibody, total  . Hepatitis B surface antibody,qualitative  . CBC with Differential/Platelet  . Protime-INR  . Ambulatory referral to Gastroenterology

## 2020-09-30 NOTE — Assessment & Plan Note (Signed)
Will monitor this at this time.  I favor post cholecystectomy dilation.

## 2020-09-30 NOTE — Telephone Encounter (Signed)
Patient confirmed that she got the message and was excited to hear it.

## 2020-09-30 NOTE — Telephone Encounter (Signed)
I left detailed messages for the nurse Dois Davenport and also on [name]'s mobile #. I told Mallory Stewart to call me back and confirm she got this message. I will take the meds off her list as Dr Leone Payor instructed me to do.

## 2020-10-04 NOTE — Telephone Encounter (Signed)
Can eval and treat, yes to rom

## 2020-10-04 NOTE — Telephone Encounter (Signed)
Mallory Stewart with [facility] of ok to orders below.

## 2020-10-06 ENCOUNTER — Ambulatory Visit: Payer: Self-pay

## 2020-10-06 ENCOUNTER — Other Ambulatory Visit: Payer: Self-pay | Admitting: *Deleted

## 2020-10-06 ENCOUNTER — Ambulatory Visit (INDEPENDENT_AMBULATORY_CARE_PROVIDER_SITE_OTHER): Payer: 59 | Admitting: Orthopedic Surgery

## 2020-10-06 ENCOUNTER — Other Ambulatory Visit: Payer: Self-pay

## 2020-10-06 DIAGNOSIS — S82891D Other fracture of right lower leg, subsequent encounter for closed fracture with routine healing: Secondary | ICD-10-CM

## 2020-10-06 DIAGNOSIS — Z23 Encounter for immunization: Secondary | ICD-10-CM

## 2020-10-06 MED ORDER — OXYCODONE HCL 5 MG PO TABS: 5.0000 mg | ORAL_TABLET | Freq: Four times a day (QID) | ORAL | 0 refills | Status: DC | PRN

## 2020-10-07 ENCOUNTER — Other Ambulatory Visit: Payer: Self-pay

## 2020-10-07 ENCOUNTER — Telehealth: Payer: Self-pay

## 2020-10-07 MED ORDER — PNEUMOVAX 23 25 MCG/0.5ML IJ INJ: 0.5000 mL | INJECTION | Freq: Once | INTRAMUSCULAR | 0 refills | Status: AC

## 2020-10-07 MED ORDER — HEPATITIS A VACCINE 1440 EL U/ML IM SUSP: INTRAMUSCULAR | 1 refills | Status: DC

## 2020-10-07 NOTE — Telephone Encounter (Signed)
Trey Paula, a nurse with [facility] wanted to let Dr. Lajoyce Corners know that patient would like to end the nursing service of home health, but keep PT.  CB# [phone].  Please advise.  Thank you.

## 2020-10-07 NOTE — Telephone Encounter (Signed)
Noted  

## 2020-10-10 ENCOUNTER — Telehealth: Payer: Self-pay | Admitting: Orthopedic Surgery

## 2020-10-10 NOTE — Telephone Encounter (Signed)
Patient called. She says Dr. Lajoyce Corners was going to give her another compression sock. Would like to know if she should come by and pick it up. Her call back number is [phone]

## 2020-10-11 NOTE — Telephone Encounter (Signed)
10/18/2020 if PT eval. You are to bring in sock for the pt but they are wanting to pick up sooner.

## 2020-10-11 NOTE — Telephone Encounter (Signed)
Called pt to advise that sock is at the front desk for pick up whenever she has time.

## 2020-10-12 ENCOUNTER — Other Ambulatory Visit: Payer: Self-pay

## 2020-10-12 ENCOUNTER — Ambulatory Visit (INDEPENDENT_AMBULATORY_CARE_PROVIDER_SITE_OTHER)
Admission: RE | Admit: 2020-10-12 | Discharge: 2020-10-12 | Disposition: A | Discharge status: Home / Self Care | Payer: 59 | Source: Ambulatory Visit | Attending: Internal Medicine | Admitting: Internal Medicine

## 2020-10-12 DIAGNOSIS — Z13820 Encounter for screening for osteoporosis: Secondary | ICD-10-CM | POA: Diagnosis not present

## 2020-10-13 ENCOUNTER — Telehealth: Payer: Self-pay

## 2020-10-13 NOTE — Telephone Encounter (Signed)
With diabetes as well as her other health issue.Marland Kitchen PNA vaccine should be given every 5 years... so she was due 09/21/20 Yes, I completely agree with Dr.  Marvell Fuller recommendations.  She can have nurse appt to get those vaccines at our office or at pharmacy ( looks like GI is sending her a prescription) Please help her set up nurse vist appt if needed.

## 2020-10-13 NOTE — Telephone Encounter (Signed)
Pt saw her GI Dr, Dr Leone Payor. He suggested she get Hep A series, Tdap, and an pneumonia vaccine. She got Pneumovax 23 in 2017. Asking if she really needs another pneumonia vaccine. Also, can those be approved to have done here at [facility]? Please advise [phone]. She is aware Dr Ermalene Searing is out of the office today.

## 2020-10-13 NOTE — Telephone Encounter (Signed)
[name] notified as instructed by telephone.  Nurse visit scheduled for 10/19/2020 at 3:15 pm.

## 2020-10-16 ENCOUNTER — Encounter: Payer: Self-pay | Admitting: Orthopedic Surgery

## 2020-10-16 NOTE — Progress Notes (Signed)
Office Visit Note   Patient: Mallory Stewart           Date of Birth: Jun 07, 1960           MRN: [id number] Visit Date: 10/06/2020              Requested by: Excell Seltzer, MD [address] [place],  [place] [address] PCP: Excell Seltzer, MD  Chief Complaint  Patient presents with   Right Ankle - Routine Post Op    [place]20 right ankle ORIF       HPI: Patient is a 60 year old woman who is 4 weeks status postop reduction internal fixation right ankle trimalleolar fracture.  She is currently in a fracture boot Was given a compression stocking but she states that this was too tight.  She has been nonweightbearing.  Assessment & Plan: Visit Diagnoses:  1. Closed fracture of right ankle with routine healing, subsequent encounter     Plan: We will set patient up with physical therapy for ankle range of motion and strengthening advance to weightbearing as tolerated in a fracture boot.  Repeat three-view radiographs of the right ankle at follow-up.  Prescription for Percocet called to her pharmacy.  Follow-Up Instructions: Return in about 3 weeks (around 10/27/2020).   Ortho Exam  Patient is alert, oriented, no adenopathy, well-dressed, normal affect, normal respiratory effort. Examination patient is developing a Achilles contracture with dorsiflexion 20 degrees short of neutral.  Patient states she did 1 physical therapy session with home health.  Imaging: No results found. No images are attached to the encounter.  Labs: Lab Results  Component Value Date   HGBA1C 5.8 06/16/2020   HGBA1C 7.1 (A) 08/19/2019   HGBA1C 7.0 (H) 10/16/2018   CRP 6.9 07/09/2017   REPTSTATUS 08/09/2020 FINAL 08/08/2020   REPTSTATUS 08/13/2020 FINAL 08/08/2020   GRAMSTAIN  08/08/2020    RARE WBC PRESENT, PREDOMINANTLY MONONUCLEAR NO ORGANISMS SEEN Performed at [facility], [address] [place], [place] [address]    CULT  08/08/2020    NO GROWTH 5  DAYS Performed at [facility], [address], [place], [place] [address]      Lab Results  Component Value Date   ALBUMIN 3.3 (L) 08/24/2020   ALBUMIN 3.3 (L) 08/18/2020   ALBUMIN 2.5 (L) 08/08/2020    Lab Results  Component Value Date   MG 1.5 08/18/2020   MG 1.8 08/08/2020   MG 1.6 (L) 08/07/2020   Lab Results  Component Value Date   VD25OH 43.63 08/24/2020    No results found for: PREALBUMIN CBC EXTENDED Latest Ref Rng & Units 09/29/2020 08/18/2020 08/07/2020  WBC 4.0 - 10.5 K/uL 7.4 9.7 8.0  RBC 3.87 - 5.11 Mil/uL 3.61(L) 3.40(L) 2.89(L)  HGB 12.0 - 15.0 g/dL 11.4(L) 11.3(L) 9.9(L)  HCT 36.0 - 46.0 % 34.1(L) 33.1(L) 27.6(L)  PLT 150.0 - 400.0 K/uL 267.0 335.0 141(L)  NEUTROABS 1.4 - 7.7 K/uL 5.2 - 5.4  LYMPHSABS 0.7 - 4.0 K/uL 1.4 - 1.6     There is no height or weight on file to calculate BMI.  Orders:  Orders Placed This Encounter  Procedures   XR Ankle Complete Right   Ambulatory referral to Physical Therapy   Meds ordered this encounter  Medications   oxyCODONE (OXY IR/ROXICODONE) 5 MG immediate release tablet    Sig: Take 1 tablet (5 mg total) by mouth every 6 (six) hours as needed.  Dispense:  30 tablet    Refill:  0     Procedures: No procedures performed  Clinical Data: No additional findings.  ROS:  All other systems negative, except as noted in the HPI. Review of Systems  Objective: Vital Signs: There were no vitals taken for this visit.  Specialty Comments:  No specialty comments available.  PMFS History: Patient Active Problem List   Diagnosis Date Noted   Portal hypertension (HCC) 09/30/2020   Dilated bile duct 09/30/2020   Alcoholic cirrhosis of liver with ascites (HCC) 09/29/2020   abdominal acites 09/17/2020   Right foot pain 09/17/2020   Laceration of lesser toe of right foot without foreign body present or damage to nail 09/17/2020   Acute right ankle pain 09/17/2020   Frequent falls 09/14/2020   Closed  fracture of ankle 09/14/2020   Foot sprain, right, subsequent encounter 08/26/2020   Sprain of anterior talofibular ligament of right ankle 08/26/2020   Splenomegaly 08/09/2020   Malnutrition of moderate degree (HCC) 08/08/2020   Transaminitis 08/06/2020   Serum total bilirubin elevated 08/06/2020   Fall at home, initial encounter 08/06/2020   Generalized weakness 08/05/2020   Idiopathic peripheral neuropathy 07/13/2020   Type 2 diabetes mellitus with diabetic polyneuropathy, with long-term current use of insulin (HCC) 07/13/2020   Persistent cough for 3 weeks or longer 07/04/2020   Low HDL (under 40) 06/21/2020   Post-nasal drip 06/21/2020   Allergic rhinitis 06/21/2020   Elevated alkaline phosphatase level 06/21/2020   Duodenitis - edema ? andgioedema 10/30/2018   Chronic alcoholic pancreatitis (HCC) 09/02/2018   Diabetes mellitus with circulatory complication, HTN (HCC) 08/22/2018   Dizziness 12/26/2017   Chronic pain 05/17/2017   Osteoarthritis of spine with radiculopathy, cervical region 09/17/2016   B12 deficiency 10/21/2014   Neuropathic pain of both legs 02/11/2014   Hepatic steatosis 04/27/2013   Hyperlipidemia associated with type 2 diabetes mellitus (HCC) 11/03/2008   Hypertension associated with diabetes (HCC) 09/29/2008   PAP SMEAR, ABNORMAL 09/29/2008   Past Medical History:  Diagnosis Date   Alcohol-induced chronic pancreatitis (HCC)    B12 deficiency    Concussion    DDD (degenerative disc disease), cervical    Diabetes (HCC)    DKA, type 2 (HCC)    Gastric outlet obstruction 08/12/2018   Hypertension    Neuropathy    Pancreatic pseudocyst/cyst 05/06/2013   Seasonal allergies     Family History  Problem Relation Age of Onset   Other Mother        tachycardia.Marland KitchenMarland Kitchen?afib   Stroke Father        after hernia suegery   Prostate cancer Father    Other Father        global transient amnesia, unclear source   Atrial fibrillation Sister    Healthy Brother     Healthy Brother    Coronary artery disease Paternal Grandmother    Heart attack Paternal Grandmother 63   Brain cancer Maternal Grandfather        ?   Cancer Paternal Grandfather        ?   Breast cancer Maternal Grandmother     Past Surgical History:  Procedure Laterality Date   BIOPSY  01/19/2019   Procedure: BIOPSY;  Surgeon: Meridee Score Netty Starring., MD;  Location: [facility] ENDOSCOPY;  Service: Gastroenterology;;   CHOLECYSTECTOMY N/A 07/28/2016   Procedure: LAPAROSCOPIC CHOLECYSTECTOMY WITH INTRAOPERATIVE CHOLANGIOGRAM;  Surgeon: Rodman Pickle, MD;  Location: [facility] OR;  Service: General;  Laterality: N/A;   ESOPHAGOGASTRODUODENOSCOPY (EGD)  WITH PROPOFOL N/A 08/13/2018   Procedure: ESOPHAGOGASTRODUODENOSCOPY (EGD) WITH PROPOFOL;  Surgeon: Meridee Score Netty Starring., MD;  Location[facility] ENDOSCOPY;  Service: Gastroenterology;  Laterality: N/A;   ESOPHAGOGASTRODUODENOSCOPY (EGD) WITH PROPOFOL N/A 01/19/2019   Procedure: ESOPHAGOGASTRODUODENOSCOPY (EGD) WITH PROPOFOL;  Surgeon: Meridee Score Netty Starring., MD;  Location: [facility] ENDOSCOPY;  Service: Gastroenterology;  Laterality: N/A;   IR PARACENTESIS  08/08/2020   LEEP  1990's   ORIF FOOT FRACTURE  09/2008   L 5th metatarsal   REFRACTIVE SURGERY  2000   TONSILLECTOMY     UPPER ESOPHAGEAL ENDOSCOPIC ULTRASOUND (EUS) N/A 08/13/2018   Procedure: UPPER ESOPHAGEAL ENDOSCOPIC ULTRASOUND (EUS);  Surgeon: Lemar Lofty., MD;  Location: 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

## 2020-10-18 ENCOUNTER — Other Ambulatory Visit: Payer: Self-pay

## 2020-10-18 ENCOUNTER — Ambulatory Visit (INDEPENDENT_AMBULATORY_CARE_PROVIDER_SITE_OTHER): Payer: 59 | Admitting: Rehabilitative and Restorative Service Providers"

## 2020-10-18 ENCOUNTER — Telehealth: Payer: Self-pay | Admitting: Internal Medicine

## 2020-10-18 ENCOUNTER — Encounter: Payer: Self-pay | Admitting: Rehabilitative and Restorative Service Providers"

## 2020-10-18 DIAGNOSIS — M6281 Muscle weakness (generalized): Secondary | ICD-10-CM | POA: Diagnosis not present

## 2020-10-18 DIAGNOSIS — M25571 Pain in right ankle and joints of right foot: Secondary | ICD-10-CM | POA: Diagnosis not present

## 2020-10-18 DIAGNOSIS — R600 Localized edema: Secondary | ICD-10-CM

## 2020-10-18 DIAGNOSIS — M25671 Stiffness of right ankle, not elsewhere classified: Secondary | ICD-10-CM | POA: Diagnosis not present

## 2020-10-18 DIAGNOSIS — R262 Difficulty in walking, not elsewhere classified: Secondary | ICD-10-CM

## 2020-10-18 DIAGNOSIS — K7031 Alcoholic cirrhosis of liver with ascites: Secondary | ICD-10-CM

## 2020-10-18 MED ORDER — SPIRONOLACTONE 50 MG PO TABS: 50.0000 mg | ORAL_TABLET | Freq: Every day | ORAL | 1 refills | Status: DC

## 2020-10-18 MED ORDER — FUROSEMIDE 20 MG PO TABS: 20.0000 mg | ORAL_TABLET | Freq: Every day | ORAL | 1 refills | Status: DC

## 2020-10-18 NOTE — Therapy (Signed)
[facility] Physical Therapy [address] [place], [place], [address] Phone: [phone]   Fax:  [phone]  Physical Therapy Evaluation  Patient Details  Name: Mallory Stewart MRN: [id number] Date of Birth: October 25, 1960 Referring Provider (PT): Dr. Aldean Baker   Encounter Date: 10/18/2020   PT End of Session - 10/18/20 1306     Visit Number 1    Number of Visits 18    Date for PT Re-Evaluation 12/27/20    Authorization Type [employer] 20 PT visits per year    Authorization - Number of Visits 18    Progress Note Due on Visit 10    PT Start Time 1306    PT Stop Time 1346    PT Time Calculation (min) 40 min    Activity Tolerance Patient limited by pain    Behavior During Therapy [facility] for tasks assessed/performed             Past Medical History:  Diagnosis Date   Alcohol-induced chronic pancreatitis (HCC)    B12 deficiency    Concussion    DDD (degenerative disc disease), cervical    Diabetes (HCC)    DKA, type 2 (HCC)    Gastric outlet obstruction 08/12/2018   Hypertension    Neuropathy    Pancreatic pseudocyst/cyst 05/06/2013   Seasonal allergies     Past Surgical History:  Procedure Laterality Date   BIOPSY  01/19/2019   Procedure: BIOPSY;  Surgeon: Lemar Lofty., MD;  Location: [facility] ENDOSCOPY;  Service: Gastroenterology;;   CHOLECYSTECTOMY N/A 07/28/2016   Procedure: LAPAROSCOPIC CHOLECYSTECTOMY WITH INTRAOPERATIVE CHOLANGIOGRAM;  Surgeon: Rodman Pickle, MD;  Location: [facility] OR;  Service: General;  Laterality: N/A;   ESOPHAGOGASTRODUODENOSCOPY (EGD) WITH PROPOFOL N/A 08/13/2018   Procedure: ESOPHAGOGASTRODUODENOSCOPY (EGD) WITH PROPOFOL;  Surgeon: Lemar Lofty., MD;  Location: [facility] ENDOSCOPY;  Service: Gastroenterology;  Laterality: N/A;   ESOPHAGOGASTRODUODENOSCOPY (EGD) WITH PROPOFOL N/A 01/19/2019   Procedure: ESOPHAGOGASTRODUODENOSCOPY (EGD) WITH PROPOFOL;  Surgeon: Meridee Score Netty Starring., MD;  Location: [facility] ENDOSCOPY;   Service: Gastroenterology;  Laterality: N/A;   IR PARACENTESIS  08/08/2020   LEEP  1990's   ORIF FOOT FRACTURE  09/2008   L 5th metatarsal   REFRACTIVE SURGERY  2000   TONSILLECTOMY     UPPER ESOPHAGEAL ENDOSCOPIC ULTRASOUND (EUS) N/A 08/13/2018   Procedure: UPPER ESOPHAGEAL ENDOSCOPIC ULTRASOUND (EUS);  Surgeon: Lemar Lofty., MD;  Location: [facility] ENDOSCOPY;  Service: Gastroenterology;  Laterality: N/A;   UPPER ESOPHAGEAL ENDOSCOPIC ULTRASOUND (EUS) N/A 01/19/2019   Procedure: UPPER ESOPHAGEAL ENDOSCOPIC ULTRASOUND (EUS);  Surgeon: Lemar Lofty., MD;  Location: [facility] ENDOSCOPY;  Service: Gastroenterology;  Laterality: N/A;    There were no vitals filed for this visit.    Subjective Assessment - 10/18/20 1311     Subjective Pt. has history of vertigo and reported about 6 falls in about 2 weeks hitting various surfaces.  Pt. indicated she was in a boot and using walker/wheelchair for a period of time.  Pt. indicated fall while out of town that lead to ankle sprain/injury.  Pt. indicated another fall while walking that resulted in current injury (day before mothers day).  Pt. indicated surgery on mother's day.  Pt. indicated pain doing fairly well until evening. Has been in boot with some walking with walker.    Limitations Standing;Walking;House hold activities    Patient Stated Goals Reduce pain, walking independent/swimming, dog walks    Currently in Pain? Yes    Pain Score 1    pain at worst  10/10   Pain Location Ankle    Pain Orientation Left;Right    Pain Descriptors / Indicators Sharp;Aching;Sore    Pain Type Surgical pain    Pain Onset More than a month ago    Pain Frequency Intermittent    Aggravating Factors  worse at end of day, uncomfortable c transfers without boot.    Pain Relieving Factors medicine at times                [facility] PT Assessment - 10/18/20 0001       Assessment   Medical Diagnosis Rt ankle ORIF s/p trimalleolar fracture    Referring  Provider (PT) Dr. Aldean Baker    Onset Date/Surgical Date 09/10/20      Precautions   Precaution Comments Per last MD note prior to evaluation: WBAT in fracture boot      Restrictions   Other Position/Activity Restrictions WBAT in fracture boot      Balance Screen   Has the patient fallen in the past 6 months Yes    How many times? 7    Has the patient had a decrease in activity level because of a fear of falling?  Yes    Is the patient reluctant to leave their home because of a fear of falling?  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      Observation/Other Assessments   Focus on Therapeutic Outcomes (FOTO)  intake 52%, predicted 72 %      Observation/Other Assessments-Edema    Edema --   Present in bilateral lower extremity     ROM / Strength   AROM / PROM / Strength Strength;PROM;AROM      AROM   Overall AROM Comments pain in all Rt ankle movements    AROM Assessment Site Ankle    Right/Left Ankle Left;Right    Right Ankle Dorsiflexion -8    Right Ankle Plantar Flexion 45    Right Ankle Inversion 10    Right Ankle Eversion 12    Left Ankle Dorsiflexion 0    Left Ankle Plantar Flexion 52    Left Ankle Inversion 21    Left Ankle Eversion 27      PROM   Overall PROM Comments Passive overpressure to AROM listed above was similar and limited by pain in Rt ankle.    PROM Assessment Site Ankle    Right/Left Ankle Left;Right      Strength   Overall Strength Comments Pain with all Rt ankle MMT (plan to use    Strength Assessment Site Ankle;Knee;Hip    Right/Left Hip Left;Right    Right/Left Knee Left;Right    Right Knee Flexion 5/5    Right Knee Extension 5/5    Left Knee Flexion 5/5    Left Knee Extension 5/5    Right/Left Ankle Left;Right    Right Ankle Dorsiflexion 3+/5    Right  Ankle Plantar Flexion 2/5   seated MMT   Right Ankle Inversion 4/5    Right Ankle Eversion 3+/5    Left Ankle Dorsiflexion 5/5    Left Ankle Plantar Flexion 2+/5   seated MMT   Left Ankle Inversion 5/5    Left Ankle Eversion 5/5      Palpation   Palpation comment Tenderness to touch  lateral and medial malleolus, anterior talocrural joint line.  Hypomobility in talocrural joint mobilit assessment      Ambulation/Gait   Gait Comments Ambulation c FWW c boot on Rt ankle, step through gait pattern                        Objective measurements completed on examination: See above findings.       [facility] Adult PT Treatment/Exercise - 10/18/20 0001       Exercises   Exercises Other Exercises;Ankle    Other Exercises  HEP instruction/performance c cues for techniques, handout provided.  Trial set performed of each for comprehension and symptom assessment.      Manual Therapy   Manual therapy comments g2 ap talocrural joint mobs Rt ankle, subtalar g2 medial, lateral mobs bilateral, prom      Ankle Exercises: Stretches   Other Stretch seated strap DF stretch c knee extension 0 degrees 15 sec x 3 Rt      Ankle Exercises: Seated   Other Seated Ankle Exercises ankle pumps x 10, inversion/eversion x 10 each, abcs demonstration                    PT Education - 10/18/20 1306     Education Details HEP, POC    Person(s) Educated Patient    Methods Explanation;Demonstration;Verbal cues;Handout    Comprehension Returned demonstration;Verbalized understanding              PT Short Term Goals - 10/18/20 1306       PT SHORT TERM GOAL #1   Title Patient will demonstrate independent use of home exercise program to maintain progress from in clinic treatments.    Time 3    Period Weeks    Status New    Target Date 11/08/20               PT Long Term Goals - 10/18/20 1359       PT LONG TERM GOAL #1   Title Patient will demonstrate/report pain at  worst less than or equal to 2/10 to facilitate minimal limitation in daily activity secondary to pain symptoms.    Time 10    Period Weeks    Status New    Target Date 12/27/20      PT LONG TERM GOAL #2   Title Patient will demonstrate independent use of home exercise program to facilitate ability to maintain/progress functional gains from skilled physical therapy services.    Time 10    Period Weeks    Status New    Target Date 12/27/20      PT LONG TERM GOAL #3   Title Pt. will demonstrate FOTO outcome > or = 72 to indicated reduced disability due to condition.    Time 10    Period Weeks    Status New    Target Date 12/27/20      PT LONG TERM GOAL #4   Title Pt. will demonstrate independent community ambulation > 300 ft s deviation to facilitate usual daily walking at PLOF.    Time 10    Period Weeks    Status New    Target Date 12/27/20      PT LONG TERM GOAL #5   Title Pt. will demonstrate Rt ankle df > = 5 degrees, inversion/eversion > 25 degrees to facilitate usual mobility for daily walking, standing, transfers at PLOF.    Time 10    Period  Weeks    Status New    Target Date 12/27/20      Additional Long Term Goals   Additional Long Term Goals Yes      PT LONG TERM GOAL #6   Title Pt. will demonstrate Rt ankle MMT 5/5 throughout to facilitate usual daily and recreational activity at PLOF.    Time 10    Period Weeks    Status New    Target Date 12/27/20      PT LONG TERM GOAL #7   Title Pt. will demonstrate single leg stance bilateral > 10 seconds to facilitate improved stability in ambulation.    Time 10    Period Weeks    Status New    Target Date 12/27/20                    Plan - 10/18/20 1357     Clinical Impression Statement Patient is a 60 y.o. who comes to clinic with complaints of Rt ankle pain s/p recent ORIF with mobility, strength and movement coordination deficits that impair their ability to perform usual daily and recreational  functional activities without increase difficulty/symptoms at this time.  Patient to benefit from skilled PT services to address impairments and limitations to improve to previous level of function without restriction secondary to condition.    Personal Factors and Comorbidities Comorbidity 3+    Comorbidities DDD cervical, HTN, extensive vestibular complication history    Examination-Activity Limitations Bend;Squat;Stairs;Carry;Stand;Transfers;Dressing;Locomotion Level    Examination-Participation Restrictions Cleaning;Community Activity;Shop;Driving;Yard Work;Laundry;Occupation    Stability/Clinical Decision Making Evolving/Moderate complexity    Clinical Decision Making Moderate    Rehab Potential Good    PT Frequency 2x / week    PT Duration Other (comment)   10 weeks   PT Treatment/Interventions ADLs/Self Care Home Management;Cryotherapy;Electrical Stimulation;Iontophoresis 4mg /ml Dexamethasone;Moist Heat;Balance training;Therapeutic exercise;Therapeutic activities;Functional mobility training;Stair training;Gait training;DME Instruction;Ultrasound;Neuromuscular re-education;Patient/family education;Passive range of motion;Taping;Vasopneumatic Device;Manual techniques    PT Next Visit Plan Manual intervention for Rt ankle jt mobility gains, active movement gains, NWB strengthening.    PT Home Exercise Plan [email or web address]    Consulted and Agree with Plan of Care Patient             Patient will benefit from skilled therapeutic intervention in order to improve the following deficits and impairments:  Abnormal gait, Decreased endurance, Hypomobility, Increased edema, Decreased activity tolerance, Decreased strength, Pain, Decreased balance, Decreased mobility, Difficulty walking, Impaired perceived functional ability, Improper body mechanics, Impaired flexibility, Decreased coordination, Decreased range of motion  Visit Diagnosis: Pain in right ankle and joints of right foot  Muscle weakness  (generalized)  Stiffness of ankle joint, right  Difficulty in walking, not elsewhere classified     Problem List Patient Active Problem List   Diagnosis Date Noted   Portal hypertension (HCC) 09/30/2020   Dilated bile duct 09/30/2020   Alcoholic cirrhosis of liver with ascites (HCC) 09/29/2020   abdominal acites 09/17/2020   Right foot pain 09/17/2020   Laceration of lesser toe of right foot without foreign body present or damage to nail 09/17/2020   Acute right ankle pain 09/17/2020   Frequent falls 09/14/2020   Closed fracture of ankle 09/14/2020   Foot sprain, right, subsequent encounter 08/26/2020   Sprain of anterior talofibular ligament of right ankle 08/26/2020   Splenomegaly 08/09/2020   Malnutrition of moderate degree (HCC) 08/08/2020   Transaminitis 08/06/2020   Serum total bilirubin elevated 08/06/2020   Fall at home, initial encounter 08/06/2020   Generalized  weakness 08/05/2020   Idiopathic peripheral neuropathy 07/13/2020   Type 2 diabetes mellitus with diabetic polyneuropathy, with long-term current use of insulin (HCC) 07/13/2020   Persistent cough for 3 weeks or longer 07/04/2020   Low HDL (under 40) 06/21/2020   Post-nasal drip 06/21/2020   Allergic rhinitis 06/21/2020   Elevated alkaline phosphatase level 06/21/2020   Duodenitis - edema ? andgioedema 10/30/2018   Chronic alcoholic pancreatitis (HCC) 09/02/2018   Diabetes mellitus with circulatory complication, HTN (HCC) 08/22/2018   Dizziness 12/26/2017   Chronic pain 05/17/2017   Osteoarthritis of spine with radiculopathy, cervical region 09/17/2016   B12 deficiency 10/21/2014   Neuropathic pain of both legs 02/11/2014   Hepatic steatosis 04/27/2013   Hyperlipidemia associated with type 2 diabetes mellitus (HCC) 11/03/2008   Hypertension associated with diabetes (HCC) 09/29/2008   PAP SMEAR, ABNORMAL 09/29/2008    Chyrel Masson, PT, DPT, OCS, ATC 10/18/20  2:07 PM    [facility]  Physical Therapy [address] [place], [place], [address] Phone: [phone]   Fax:  [phone]  Name: SOPHIAROSE EADES MRN: [id number] Date of Birth: May 19, 1960

## 2020-10-18 NOTE — Telephone Encounter (Signed)
Pt abd is distended and "hard".  Her ankles are also swollen.  She states her weight is stable with no weight gain.   She was asked at her last office visit to contact our office if she thinks she has fluid build up.  She stopped diuretics a few weeks ago.  She states she has had NO alcohol.  She states she is not sure if she needs diuretics?  Please advise.

## 2020-10-18 NOTE — Telephone Encounter (Signed)
I rxed and restarted spironolactone and furosemide  She is to come for BMET 6/28 - I ordered this

## 2020-10-18 NOTE — Patient Instructions (Signed)
Access Code: [email or web address] URL: [email or web address] Date: 10/18/2020 Prepared by: Chyrel Masson  Exercises Ankle Pumps in Elevation - 2 x daily - 7 x weekly - 3 sets - 10 reps Supine Ankle Inversion Eversion AROM - 2 x daily - 7 x weekly - 3 sets - 10 reps Ankle Alphabet in Elevation - 2 x daily - 7 x weekly - 3 sets - 10 reps Seated Calf Stretch with Strap - 2 x daily - 7 x weekly - 1 sets - 5 reps - 15-20 hold Isometric Ankle Inversion at Wall - 2 x daily - 7 x weekly - 1 sets - 10 reps - 5 hold Isometric Ankle Eversion at Wall - 2 x daily - 7 x weekly - 1 sets - 10 reps - 5 hold

## 2020-10-18 NOTE — Telephone Encounter (Signed)
Left message on machine to call back  

## 2020-10-18 NOTE — Telephone Encounter (Signed)
Inbound call from patient. States she believes there is fluid building up in her abdomen. Best contact number [phone]

## 2020-10-18 NOTE — Telephone Encounter (Signed)
The patient has been notified of this information and all questions answered.  The pt will come on 6/28 for labs

## 2020-10-19 ENCOUNTER — Ambulatory Visit (INDEPENDENT_AMBULATORY_CARE_PROVIDER_SITE_OTHER): Payer: 59

## 2020-10-19 ENCOUNTER — Other Ambulatory Visit: Payer: Self-pay

## 2020-10-19 DIAGNOSIS — Z23 Encounter for immunization: Secondary | ICD-10-CM | POA: Diagnosis not present

## 2020-10-19 NOTE — Progress Notes (Signed)
Per orders of Dr. Patsy Lager, in Dr. Daphine Deutscher absence, injection of Hep A, Pneumovax 23, and TDAP  given by Erby Pian. Patient tolerated injection well.

## 2020-10-19 NOTE — Progress Notes (Signed)
The patient has been notified of this information and all questions answered.

## 2020-10-20 ENCOUNTER — Ambulatory Visit: Payer: 59 | Admitting: Physical Therapy

## 2020-10-20 DIAGNOSIS — R262 Difficulty in walking, not elsewhere classified: Secondary | ICD-10-CM | POA: Diagnosis not present

## 2020-10-20 DIAGNOSIS — M6281 Muscle weakness (generalized): Secondary | ICD-10-CM

## 2020-10-20 DIAGNOSIS — M25571 Pain in right ankle and joints of right foot: Secondary | ICD-10-CM | POA: Diagnosis not present

## 2020-10-20 DIAGNOSIS — M25671 Stiffness of right ankle, not elsewhere classified: Secondary | ICD-10-CM | POA: Diagnosis not present

## 2020-10-20 NOTE — Therapy (Signed)
[facility] [address] [place], [place], [address] Phone: [phone]   Fax:  [phone]  Physical Therapy Treatment  Patient Details  Name: Mallory Stewart MRN: [id number] Date of Birth: 16-Oct-1960 Referring Provider (PT): Dr. Aldean Baker   Encounter Date: 10/20/2020   PT End of Session - 10/20/20 1142     Visit Number 2    Number of Visits 18    Date for PT Re-Evaluation 12/27/20    Authorization Type [employer] 20 PT visits per year    Authorization - Number of Visits 18    Progress Note Due on Visit 10    PT Start Time 1100    PT Stop Time 1142    PT Time Calculation (min) 42 min    Activity Tolerance Patient limited by pain    Behavior During Therapy [facility] for tasks assessed/performed             Past Medical History:  Diagnosis Date   Alcohol-induced chronic pancreatitis (HCC)    B12 deficiency    Concussion    DDD (degenerative disc disease), cervical    Diabetes (HCC)    DKA, type 2 (HCC)    Gastric outlet obstruction 08/12/2018   Hypertension    Neuropathy    Pancreatic pseudocyst/cyst 05/06/2013   Seasonal allergies     Past Surgical History:  Procedure Laterality Date   BIOPSY  01/19/2019   Procedure: BIOPSY;  Surgeon: Lemar Lofty., MD;  Location: [facility] ENDOSCOPY;  Service: Gastroenterology;;   CHOLECYSTECTOMY N/A 07/28/2016   Procedure: LAPAROSCOPIC CHOLECYSTECTOMY WITH INTRAOPERATIVE CHOLANGIOGRAM;  Surgeon: Rodman Pickle, MD;  Location: [facility] OR;  Service: General;  Laterality: N/A;   ESOPHAGOGASTRODUODENOSCOPY (EGD) WITH PROPOFOL N/A 08/13/2018   Procedure: ESOPHAGOGASTRODUODENOSCOPY (EGD) WITH PROPOFOL;  Surgeon: Lemar Lofty., MD;  Location: [facility] ENDOSCOPY;  Service: Gastroenterology;  Laterality: N/A;   ESOPHAGOGASTRODUODENOSCOPY (EGD) WITH PROPOFOL N/A 01/19/2019   Procedure: ESOPHAGOGASTRODUODENOSCOPY (EGD) WITH PROPOFOL;  Surgeon: Meridee Score Netty Starring., MD;  Location: [facility] ENDOSCOPY;  Service:  Gastroenterology;  Laterality: N/A;   IR PARACENTESIS  08/08/2020   LEEP  1990's   ORIF FOOT FRACTURE  09/2008   L 5th metatarsal   REFRACTIVE SURGERY  2000   TONSILLECTOMY     UPPER ESOPHAGEAL ENDOSCOPIC ULTRASOUND (EUS) N/A 08/13/2018   Procedure: UPPER ESOPHAGEAL ENDOSCOPIC ULTRASOUND (EUS);  Surgeon: Lemar Lofty., MD;  Location: [facility] ENDOSCOPY;  Service: Gastroenterology;  Laterality: N/A;   UPPER ESOPHAGEAL ENDOSCOPIC ULTRASOUND (EUS) N/A 01/19/2019   Procedure: UPPER ESOPHAGEAL ENDOSCOPIC ULTRASOUND (EUS);  Surgeon: Lemar Lofty., MD;  Location: [facility] ENDOSCOPY;  Service: Gastroenterology;  Laterality: N/A;    There were no vitals filed for this visit.   Subjective Assessment - 10/20/20 1125     Subjective She relays she has 8/10 pain in her Rt ankle today upon arrival. She relays good early compliance to HEP. She relays she has vertigo and gets less dizzy if she is sitting for exercises vs laying.    Limitations Standing;Walking;House hold activities    Patient Stated Goals Reduce pain, walking independent/swimming, dog walks    Pain Onset More than a month ago               [facility] Adult PT Treatment/Exercise - 10/20/20 0001       Manual Therapy   Manual therapy comments g2 ap talocrural joint mobs Rt ankle, subtalar g2 medial, lateral mobs bilateral, prom all planes      Ankle Exercises: Aerobic  Nustep L4 X5 min      Ankle Exercises: Seated   ABC's 1 rep    Towel Crunch --   30 seconds   Other Seated Ankle Exercises rocker board 30 sec ea for INV/EV, DF/PF, and circles    Other Seated Ankle Exercises ankle isometrics pushing into yellow ball 5 sec X10 ea plane      Ankle Exercises: Stretches   Other Stretch seated strap DF stretch c knee extension 0 degrees 30 sec x 3 Rt, and knee flexed 30 sec X3                      PT Short Term Goals - 10/18/20 1306       PT SHORT TERM GOAL #1   Title Patient will demonstrate independent use of  home exercise program to maintain progress from in clinic treatments.    Time 3    Period Weeks    Status New    Target Date 11/08/20               PT Long Term Goals - 10/18/20 1359       PT LONG TERM GOAL #1   Title Patient will demonstrate/report pain at worst less than or equal to 2/10 to facilitate minimal limitation in daily activity secondary to pain symptoms.    Time 10    Period Weeks    Status New    Target Date 12/27/20      PT LONG TERM GOAL #2   Title Patient will demonstrate independent use of home exercise program to facilitate ability to maintain/progress functional gains from skilled physical therapy services.    Time 10    Period Weeks    Status New    Target Date 12/27/20      PT LONG TERM GOAL #3   Title Pt. will demonstrate FOTO outcome > or = 72 to indicated reduced disability due to condition.    Time 10    Period Weeks    Status New    Target Date 12/27/20      PT LONG TERM GOAL #4   Title Pt. will demonstrate independent community ambulation > 300 ft s deviation to facilitate usual daily walking at PLOF.    Time 10    Period Weeks    Status New    Target Date 12/27/20      PT LONG TERM GOAL #5   Title Pt. will demonstrate Rt ankle df > = 5 degrees, inversion/eversion > 25 degrees to facilitate usual mobility for daily walking, standing, transfers at PLOF.    Time 10    Period Weeks    Status New    Target Date 12/27/20      Additional Long Term Goals   Additional Long Term Goals Yes      PT LONG TERM GOAL #6   Title Pt. will demonstrate Rt ankle MMT 5/5 throughout to facilitate usual daily and recreational activity at PLOF.    Time 10    Period Weeks    Status New    Target Date 12/27/20      PT LONG TERM GOAL #7   Title Pt. will demonstrate single leg stance bilateral > 10 seconds to facilitate improved stability in ambulation.    Time 10    Period Weeks    Status New    Target Date 12/27/20  Plan - 10/20/20 1143     Clinical Impression Statement She was somewhat limited by pain today. Session focused on ROM with mobility and stretching exercises combined with manual therapy to improve overall ROM. We peformed exericises in sitting today as she has bad vertigo and gets dizzy with laying down then sitting back up.    Personal Factors and Comorbidities Comorbidity 3+    Comorbidities DDD cervical, HTN, extensive vestibular complication history    Examination-Activity Limitations Bend;Squat;Stairs;Carry;Stand;Transfers;Dressing;Locomotion Level    Examination-Participation Restrictions Cleaning;Community Activity;Shop;Driving;Yard Work;Laundry;Occupation    Stability/Clinical Decision Making Evolving/Moderate complexity    Rehab Potential Good    PT Frequency 2x / week    PT Duration Other (comment)   10 weeks   PT Treatment/Interventions ADLs/Self Care Home Management;Cryotherapy;Electrical Stimulation;Iontophoresis 4mg /ml Dexamethasone;Moist Heat;Balance training;Therapeutic exercise;Therapeutic activities;Functional mobility training;Stair training;Gait training;DME Instruction;Ultrasound;Neuromuscular re-education;Patient/family education;Passive range of motion;Taping;Vasopneumatic Device;Manual techniques    PT Next Visit Plan what was her tolerance to last session? Manual intervention for Rt ankle jt mobility gains, active movement gains, NWB strengthening.    PT Home Exercise Plan [email or web address]    Consulted and Agree with Plan of Care Patient             Patient will benefit from skilled therapeutic intervention in order to improve the following deficits and impairments:  Abnormal gait, Decreased endurance, Hypomobility, Increased edema, Decreased activity tolerance, Decreased strength, Pain, Decreased balance, Decreased mobility, Difficulty walking, Impaired perceived functional ability, Improper body mechanics, Impaired flexibility, Decreased coordination, Decreased range of  motion  Visit Diagnosis: Pain in right ankle and joints of right foot  Muscle weakness (generalized)  Stiffness of ankle joint, right  Difficulty in walking, not elsewhere classified     Problem List Patient Active Problem List   Diagnosis Date Noted   Portal hypertension (HCC) 09/30/2020   Dilated bile duct 09/30/2020   Alcoholic cirrhosis of liver with ascites (HCC) 09/29/2020   abdominal acites 09/17/2020   Right foot pain 09/17/2020   Laceration of lesser toe of right foot without foreign body present or damage to nail 09/17/2020   Acute right ankle pain 09/17/2020   Frequent falls 09/14/2020   Closed fracture of ankle 09/14/2020   Foot sprain, right, subsequent encounter 08/26/2020   Sprain of anterior talofibular ligament of right ankle 08/26/2020   Splenomegaly 08/09/2020   Malnutrition of moderate degree (HCC) 08/08/2020   Transaminitis 08/06/2020   Serum total bilirubin elevated 08/06/2020   Fall at home, initial encounter 08/06/2020   Generalized weakness 08/05/2020   Idiopathic peripheral neuropathy 07/13/2020   Type 2 diabetes mellitus with diabetic polyneuropathy, with long-term current use of insulin (HCC) 07/13/2020   Persistent cough for 3 weeks or longer 07/04/2020   Low HDL (under 40) 06/21/2020   Post-nasal drip 06/21/2020   Allergic rhinitis 06/21/2020   Elevated alkaline phosphatase level 06/21/2020   Duodenitis - edema ? andgioedema 10/30/2018   Chronic alcoholic pancreatitis (HCC) 09/02/2018   Diabetes mellitus with circulatory complication, HTN (HCC) 08/22/2018   Dizziness 12/26/2017   Chronic pain 05/17/2017   Osteoarthritis of spine with radiculopathy, cervical region 09/17/2016   B12 deficiency 10/21/2014   Neuropathic pain of both legs 02/11/2014   Hepatic steatosis 04/27/2013   Hyperlipidemia associated with type 2 diabetes mellitus (HCC) 11/03/2008   Hypertension associated with diabetes (HCC) 09/29/2008   PAP SMEAR, ABNORMAL  09/29/2008    Birdie RiddleBrian R Dondi Burandt,PT,DPT 10/20/2020, 1:51 PM  [facility] [address] [place], [place], [address] Phone: [phone]  Fax:  [phone]  Name: Mallory Stewart MRN: [id number] Date of Birth: 11/22/1960

## 2020-10-24 ENCOUNTER — Other Ambulatory Visit: Payer: Self-pay | Admitting: Physician Assistant

## 2020-10-24 ENCOUNTER — Telehealth: Payer: Self-pay

## 2020-10-24 MED ORDER — OXYCODONE HCL 5 MG PO TABS: 5.0000 mg | ORAL_TABLET | Freq: Three times a day (TID) | ORAL | 0 refills | Status: DC | PRN

## 2020-10-24 NOTE — Telephone Encounter (Signed)
[place]20 right ankle ORIF pt requesting refill of Oxycodone 5 mg last refill was 10/06/20 #30 please advise.

## 2020-10-24 NOTE — Telephone Encounter (Signed)
Pt called and would like a refill on oxycodone  

## 2020-10-24 NOTE — Telephone Encounter (Signed)
Refilled but changed interval to every 8 hours

## 2020-10-25 ENCOUNTER — Ambulatory Visit: Payer: 59 | Admitting: Physical Therapy

## 2020-10-25 ENCOUNTER — Encounter: Payer: Self-pay | Admitting: Physical Therapy

## 2020-10-25 ENCOUNTER — Other Ambulatory Visit: Payer: Self-pay

## 2020-10-25 DIAGNOSIS — M25571 Pain in right ankle and joints of right foot: Secondary | ICD-10-CM

## 2020-10-25 DIAGNOSIS — R262 Difficulty in walking, not elsewhere classified: Secondary | ICD-10-CM

## 2020-10-25 DIAGNOSIS — M6281 Muscle weakness (generalized): Secondary | ICD-10-CM

## 2020-10-25 DIAGNOSIS — R2681 Unsteadiness on feet: Secondary | ICD-10-CM

## 2020-10-25 DIAGNOSIS — M25671 Stiffness of right ankle, not elsewhere classified: Secondary | ICD-10-CM

## 2020-10-25 DIAGNOSIS — R42 Dizziness and giddiness: Secondary | ICD-10-CM

## 2020-10-25 DIAGNOSIS — H8113 Benign paroxysmal vertigo, bilateral: Secondary | ICD-10-CM

## 2020-10-25 NOTE — Therapy (Signed)
[facility] Physical Therapy [address] [place], [place], [address] Phone: [phone]   Fax:  [phone]  Physical Therapy Treatment/Recertification  Patient Details  Name: Mallory Stewart MRN: [id number] Date of Birth: 12-Jun-1960 Referring Provider (PT): Dr. Aldean Baker   Encounter Date: 10/25/2020   PT End of Session - 10/25/20 1139     Visit Number 3    Number of Visits 18    Date for PT Re-Evaluation 12/27/20    Authorization Type U[employer] 20 PT visits per year    Authorization - Number of Visits 18    Progress Note Due on Visit 10    PT Start Time 1055    PT Stop Time 1137    PT Time Calculation (min) 42 min    Activity Tolerance Patient limited by pain    Behavior During Therapy [facility] for tasks assessed/performed             Past Medical History:  Diagnosis Date   Alcohol-induced chronic pancreatitis (HCC)    B12 deficiency    Concussion    DDD (degenerative disc disease), cervical    Diabetes (HCC)    DKA, type 2 (HCC)    Gastric outlet obstruction 08/12/2018   Hypertension    Neuropathy    Pancreatic pseudocyst/cyst 05/06/2013   Seasonal allergies     Past Surgical History:  Procedure Laterality Date   BIOPSY  01/19/2019   Procedure: BIOPSY;  Surgeon: Lemar Lofty., MD;  Location: [facility] ENDOSCOPY;  Service: Gastroenterology;;   CHOLECYSTECTOMY N/A 07/28/2016   Procedure: LAPAROSCOPIC CHOLECYSTECTOMY WITH INTRAOPERATIVE CHOLANGIOGRAM;  Surgeon: Rodman Pickle, MD;  Location: [facility] OR;  Service: General;  Laterality: N/A;   ESOPHAGOGASTRODUODENOSCOPY (EGD) WITH PROPOFOL N/A 08/13/2018   Procedure: ESOPHAGOGASTRODUODENOSCOPY (EGD) WITH PROPOFOL;  Surgeon: Lemar Lofty., MD;  Location: [facility] ENDOSCOPY;  Service: Gastroenterology;  Laterality: N/A;   ESOPHAGOGASTRODUODENOSCOPY (EGD) WITH PROPOFOL N/A 01/19/2019   Procedure: ESOPHAGOGASTRODUODENOSCOPY (EGD) WITH PROPOFOL;  Surgeon: Meridee Score Netty Starring., MD;  Location: [facility]  ENDOSCOPY;  Service: Gastroenterology;  Laterality: N/A;   IR PARACENTESIS  08/08/2020   LEEP  1990's   ORIF FOOT FRACTURE  09/2008   L 5th metatarsal   REFRACTIVE SURGERY  2000   TONSILLECTOMY     UPPER ESOPHAGEAL ENDOSCOPIC ULTRASOUND (EUS) N/A 08/13/2018   Procedure: UPPER ESOPHAGEAL ENDOSCOPIC ULTRASOUND (EUS);  Surgeon: Lemar Lofty., MD;  Location: [facility] ENDOSCOPY;  Service: Gastroenterology;  Laterality: N/A;   UPPER ESOPHAGEAL ENDOSCOPIC ULTRASOUND (EUS) N/A 01/19/2019   Procedure: UPPER ESOPHAGEAL ENDOSCOPIC ULTRASOUND (EUS);  Surgeon: Lemar Lofty., MD;  Location: [facility] ENDOSCOPY;  Service: Gastroenterology;  Laterality: N/A;    There were no vitals filed for this visit.   Subjective Assessment - 10/25/20 1101     Subjective vertigo is acting up today - she's very dizzy    Limitations Standing;Walking;House hold activities    Patient Stated Goals Reduce pain, walking independent/swimming, dog walks    Currently in Pain? Yes    Pain Score 6     Pain Location Ankle    Pain Orientation Left    Pain Descriptors / Indicators Sharp;Aching;Sore    Pain Type Surgical pain    Pain Onset More than a month ago    Pain Frequency Intermittent    Aggravating Factors  worse at the end of the day    Pain Relieving Factors meds                     Vestibular  Assessment - 10/25/20 1102       Vestibular Assessment   General Observation rates symptoms at rest 3/10      Symptom Behavior   Subjective history of current problem multiple episodes, 6 falls in 1 month    Type of Dizziness  Imbalance;Unsteady with head/body turns   "clogged ears"   Frequency of Dizziness intermittent    Duration of Dizziness initially constant, now less "I don't know"    Symptom Nature Spontaneous    Aggravating Factors Rolling to right;Rolling to left;Activity in general;Turning head quickly    Relieving Factors Medication    Progression of Symptoms Better    History of similar  episodes see prior PT notes      Oculomotor Exam   Oculomotor Alignment Abnormal    Spontaneous Absent    Gaze-induced  Absent    Smooth Pursuits Intact    Saccades Intact   reports increased difficulty to Rt     Positional Testing   Dix-Hallpike Dix-Hallpike Right;Dix-Hallpike Left      Dix-Hallpike Right   Dix-Hallpike Right Duration 5 sec    Dix-Hallpike Right Symptoms Upbeat, right rotatory nystagmus      Dix-Hallpike Left   Dix-Hallpike Left Duration <2sec    Dix-Hallpike Left Symptoms No nystagmus                      [facility] Adult PT Treatment/Exercise - 10/25/20 0001       Ankle Exercises: Seated   ABC's 1 rep    Ankle Circles/Pumps Right;20 reps    Other Seated Ankle Exercises ankle 4 way with L4 band x20 reps each             Vestibular Treatment/Exercise - 10/25/20 1119       Vestibular Treatment/Exercise   Vestibular Treatment Provided Canalith Repositioning    Canalith Repositioning Epley Manuever Right       EPLEY MANUEVER RIGHT   Number of Reps  2    Overall Response Improved Symptoms    Response Details  symptoms nearly resolved                     PT Short Term Goals - 10/18/20 1306       PT SHORT TERM GOAL #1   Title Patient will demonstrate independent use of home exercise program to maintain progress from in clinic treatments.    Time 3    Period Weeks    Status New    Target Date 11/08/20               PT Long Term Goals - 10/18/20 1359       PT LONG TERM GOAL #1   Title Patient will demonstrate/report pain at worst less than or equal to 2/10 to facilitate minimal limitation in daily activity secondary to pain symptoms.    Time 10    Period Weeks    Status New    Target Date 12/27/20      PT LONG TERM GOAL #2   Title Patient will demonstrate independent use of home exercise program to facilitate ability to maintain/progress functional gains from skilled physical therapy services.    Time 10     Period Weeks    Status New    Target Date 12/27/20      PT LONG TERM GOAL #3   Title Pt. will demonstrate FOTO outcome > or = 72 to indicated reduced disability due to condition.  Time 10    Period Weeks    Status New    Target Date 12/27/20      PT LONG TERM GOAL #4   Title Pt. will demonstrate independent community ambulation > 300 ft s deviation to facilitate usual daily walking at PLOF.    Time 10    Period Weeks    Status New    Target Date 12/27/20      PT LONG TERM GOAL #5   Title Pt. will demonstrate Rt ankle df > = 5 degrees, inversion/eversion > 25 degrees to facilitate usual mobility for daily walking, standing, transfers at PLOF.    Time 10    Period Weeks    Status New    Target Date 12/27/20      Additional Long Term Goals   Additional Long Term Goals Yes      PT LONG TERM GOAL #6   Title Pt. will demonstrate Rt ankle MMT 5/5 throughout to facilitate usual daily and recreational activity at PLOF.    Time 10    Period Weeks    Status New    Target Date 12/27/20      PT LONG TERM GOAL #7   Title Pt. will demonstrate single leg stance bilateral > 10 seconds to facilitate improved stability in ambulation.    Time 10    Period Weeks    Status New    Target Date 12/27/20                   Plan - 10/25/20 1142     Clinical Impression Statement Pt arrived to PT today with reports of increased vertigo so assessed today to help with future sessions and balance.  Positive for Rt pBPPV today which was nearly resolved on 2nd reps.  She was symptomatic on Lt side as well without nystagmus.  Continued with NWB ankle exercises after working on vertigo which she tolerated well.    Personal Factors and Comorbidities Comorbidity 3+    Comorbidities DDD cervical, HTN, extensive vestibular complication history    Examination-Activity Limitations Bend;Squat;Stairs;Carry;Stand;Transfers;Dressing;Locomotion Level    Examination-Participation Restrictions  Cleaning;Community Activity;Shop;Driving;Yard Work;Laundry;Occupation    Stability/Clinical Decision Making Evolving/Moderate complexity    Rehab Potential Good    PT Frequency 2x / week    PT Duration Other (comment)   10 weeks   PT Treatment/Interventions ADLs/Self Care Home Management;Cryotherapy;Electrical Stimulation;Iontophoresis 4mg /ml Dexamethasone;Moist Heat;Balance training;Therapeutic exercise;Therapeutic activities;Functional mobility training;Stair training;Gait training;DME Instruction;Ultrasound;Neuromuscular re-education;Patient/family education;Passive range of motion;Taping;Vasopneumatic Device;Manual techniques;Canalith Repostioning;Vestibular    PT Next Visit Plan Manual intervention for Rt ankle jt mobility gains, active movement gains, NWB strengthening. reassess vertigo PRN    PT Home Exercise Plan [id number]    Consulted and Agree with Plan of Care Patient             Patient will benefit from skilled therapeutic intervention in order to improve the following deficits and impairments:  Abnormal gait, Decreased endurance, Hypomobility, Increased edema, Decreased activity tolerance, Decreased strength, Pain, Decreased balance, Decreased mobility, Difficulty walking, Impaired perceived functional ability, Improper body mechanics, Impaired flexibility, Decreased coordination, Decreased range of motion, Dizziness  Visit Diagnosis: Pain in right ankle and joints of right foot - Plan: PT plan of care cert/re-cert  Muscle weakness (generalized) - Plan: PT plan of care cert/re-cert  Stiffness of ankle joint, right - Plan: PT plan of care cert/re-cert  Difficulty in walking, not elsewhere classified - Plan: PT plan of care cert/re-cert  Unsteadiness on feet - Plan: PT  plan of care cert/re-cert  Dizziness and giddiness - Plan: PT plan of care cert/re-cert  BPPV (benign paroxysmal positional vertigo), bilateral - Plan: PT plan of care cert/re-cert     Problem  List Patient Active Problem List   Diagnosis Date Noted   Portal hypertension (HCC) 09/30/2020   Dilated bile duct 09/30/2020   Alcoholic cirrhosis of liver with ascites (HCC) 09/29/2020   abdominal acites 09/17/2020   Right foot pain 09/17/2020   Laceration of lesser toe of right foot without foreign body present or damage to nail 09/17/2020   Acute right ankle pain 09/17/2020   Frequent falls 09/14/2020   Closed fracture of ankle 09/14/2020   Foot sprain, right, subsequent encounter 08/26/2020   Sprain of anterior talofibular ligament of right ankle 08/26/2020   Splenomegaly 08/09/2020   Malnutrition of moderate degree (HCC) 08/08/2020   Transaminitis 08/06/2020   Serum total bilirubin elevated 08/06/2020   Fall at home, initial encounter 08/06/2020   Generalized weakness 08/05/2020   Idiopathic peripheral neuropathy 07/13/2020   Type 2 diabetes mellitus with diabetic polyneuropathy, with long-term current use of insulin (HCC) 07/13/2020   Persistent cough for 3 weeks or longer 07/04/2020   Low HDL (under 40) 06/21/2020   Post-nasal drip 06/21/2020   Allergic rhinitis 06/21/2020   Elevated alkaline phosphatase level 06/21/2020   Duodenitis - edema ? andgioedema 10/30/2018   Chronic alcoholic pancreatitis (HCC) 09/02/2018   Diabetes mellitus with circulatory complication, HTN (HCC) 08/22/2018   Dizziness 12/26/2017   Chronic pain 05/17/2017   Osteoarthritis of spine with radiculopathy, cervical region 09/17/2016   B12 deficiency 10/21/2014   Neuropathic pain of both legs 02/11/2014   Hepatic steatosis 04/27/2013   Hyperlipidemia associated with type 2 diabetes mellitus (HCC) 11/03/2008   Hypertension associated with diabetes (HCC) 09/29/2008   PAP SMEAR, ABNORMAL 09/29/2008        Clarita Crane, PT, DPT 10/25/20 12:26 PM       [facility] Physical Therapy [address] [place], [place], [address] Phone: [phone]   Fax:   [phone]  Name: KATIRIA CALAME MRN: [id number] Date of Birth: 26-Jun-1960

## 2020-10-27 ENCOUNTER — Ambulatory Visit: Payer: Self-pay

## 2020-10-27 ENCOUNTER — Ambulatory Visit (INDEPENDENT_AMBULATORY_CARE_PROVIDER_SITE_OTHER): Payer: 59 | Admitting: Physician Assistant

## 2020-10-27 ENCOUNTER — Other Ambulatory Visit: Payer: Self-pay

## 2020-10-27 ENCOUNTER — Encounter: Payer: Self-pay | Admitting: Orthopedic Surgery

## 2020-10-27 ENCOUNTER — Encounter: Payer: 59 | Admitting: Physical Therapy

## 2020-10-27 DIAGNOSIS — S82891D Other fracture of right lower leg, subsequent encounter for closed fracture with routine healing: Secondary | ICD-10-CM

## 2020-10-27 MED ORDER — MELOXICAM 7.5 MG PO TABS: 7.5000 mg | ORAL_TABLET | Freq: Every day | ORAL | 0 refills | Status: DC

## 2020-10-27 NOTE — Progress Notes (Signed)
Office Visit Note   Patient: Mallory Stewart           Date of Birth: 1960-06-24           MRN: [id number] Visit Date: 10/27/2020              Requested by: Excell Seltzer, MD [address] [place],  [place] [address] PCP: Excell Seltzer, MD  Chief Complaint  Patient presents with   Right Ankle - Routine Post Op    5/147/22 out of state surgery ORIF right ankle fx       HPI: Patient is a pleasant 60 year old woman who is 6 weeks status post open reduction internal fixation of a right trimalleolar ankle fracture.  She has been working with physical therapy and advancing her weightbearing.  She is also been wearing a compression sock.  She is due to do physical therapy today.  She says she is having more pain this morning not necessarily over the fracture sites but with in the ankle itself denies any fever chills or calf pain  Assessment & Plan: Visit Diagnoses:  1. Closed fracture of right ankle with routine healing, subsequent encounter     Plan: We will call in a small amount of meloxicam to help with some of the inflamed inflammation.  Progress with PT.  I told her to contact us if she does get a suture abscess we certainly can take care of this for her.  Follow-up in 4 weeks  Follow-Up Instructions: No follow-ups on file.   Ortho Exam  Patient is alert, oriented, no adenopathy, well-dressed, normal affect, normal respiratory effort. Examination well-healed surgical incisions.  She does have a small palpable area over the lateral incision that is not open but feels like the beginning of the suture abscess.  It is slightly tender to palpation but there is no surrounding erythema no ascending cellulitis.  She is nontender over the fibula minimally tender over the medial malleolus.  She does have some irritation with range of motion of her ankle.  Swelling is well controlled no signs of infection  Imaging: No results found.   Labs: Lab Results  Component Value Date    HGBA1C 5.8 06/16/2020   HGBA1C 7.1 (A) 08/19/2019   HGBA1C 7.0 (H) 10/16/2018   CRP 6.9 07/09/2017   REPTSTATUS 08/09/2020 FINAL 08/08/2020   REPTSTATUS 08/13/2020 FINAL 08/08/2020   GRAMSTAIN  08/08/2020    RARE WBC PRESENT, PREDOMINANTLY MONONUCLEAR NO ORGANISMS SEEN Performed at [facility], [address] [place], [place] [address]    CULT  08/08/2020    NO GROWTH 5 DAYS Performed at [facility], [address], [place], [place] [address]      Lab Results  Component Value Date   ALBUMIN 3.3 (L) 08/24/2020   ALBUMIN 3.3 (L) 08/18/2020   ALBUMIN 2.5 (L) 08/08/2020    Lab Results  Component Value Date   MG 1.5 08/18/2020   MG 1.8 08/08/2020   MG 1.6 (L) 08/07/2020   Lab Results  Component Value Date   VD25OH 43.63 08/24/2020    No results found for: PREALBUMIN CBC EXTENDED Latest Ref Rng & Units 09/29/2020 08/18/2020 08/07/2020  WBC 4.0 - 10.5 K/uL 7.4 9.7 8.0  RBC 3.87 - 5.11 Mil/uL 3.61(L) 3.40(L) 2.89(L)  HGB 12.0 - 15.0 g/dL 11.4(L) 11.3(L) 9.9(L)  HCT 36.0 - 46.0 % 34.1(L) 33.1(L) 27.6(L)  PLT 150.0 - 400.0 K/uL 267.0 335.0 141(L)  NEUTROABS 1.4 -  7.7 K/uL 5.2 - 5.4  LYMPHSABS 0.7 - 4.0 K/uL 1.4 - 1.6     There is no height or weight on file to calculate BMI.  Orders:  Orders Placed This Encounter  Procedures   XR Ankle Complete Right   No orders of the defined types were placed in this encounter.    Procedures: No procedures performed  Clinical Data: No additional findings.  ROS:  All other systems negative, except as noted in the HPI. Review of Systems  Objective: Vital Signs: There were no vitals taken for this visit.  Specialty Comments:  No specialty comments available.  PMFS History: Patient Active Problem List   Diagnosis Date Noted   Portal hypertension (HCC) 09/30/2020   Dilated bile duct 09/30/2020   Alcoholic cirrhosis of liver with ascites (HCC) 09/29/2020   abdominal acites 09/17/2020   Right foot pain  09/17/2020   Laceration of lesser toe of right foot without foreign body present or damage to nail 09/17/2020   Acute right ankle pain 09/17/2020   Frequent falls 09/14/2020   Closed fracture of ankle 09/14/2020   Foot sprain, right, subsequent encounter 08/26/2020   Sprain of anterior talofibular ligament of right ankle 08/26/2020   Splenomegaly 08/09/2020   Malnutrition of moderate degree (HCC) 08/08/2020   Transaminitis 08/06/2020   Serum total bilirubin elevated 08/06/2020   Fall at home, initial encounter 08/06/2020   Generalized weakness 08/05/2020   Idiopathic peripheral neuropathy 07/13/2020   Type 2 diabetes mellitus with diabetic polyneuropathy, with long-term current use of insulin (HCC) 07/13/2020   Persistent cough for 3 weeks or longer 07/04/2020   Low HDL (under 40) 06/21/2020   Post-nasal drip 06/21/2020   Allergic rhinitis 06/21/2020   Elevated alkaline phosphatase level 06/21/2020   Duodenitis - edema ? andgioedema 10/30/2018   Chronic alcoholic pancreatitis (HCC) 09/02/2018   Diabetes mellitus with circulatory complication, HTN (HCC) 08/22/2018   Dizziness 12/26/2017   Chronic pain 05/17/2017   Osteoarthritis of spine with radiculopathy, cervical region 09/17/2016   B12 deficiency 10/21/2014   Neuropathic pain of both legs 02/11/2014   Hepatic steatosis 04/27/2013   Hyperlipidemia associated with type 2 diabetes mellitus (HCC) 11/03/2008   Hypertension associated with diabetes (HCC) 09/29/2008   PAP SMEAR, ABNORMAL 09/29/2008   Past Medical History:  Diagnosis Date   Alcohol-induced chronic pancreatitis (HCC)    B12 deficiency    Concussion    DDD (degenerative disc disease), cervical    Diabetes (HCC)    DKA, type 2 (HCC)    Gastric outlet obstruction 08/12/2018   Hypertension    Neuropathy    Pancreatic pseudocyst/cyst 05/06/2013   Seasonal allergies     Family History  Problem Relation Age of Onset   Other Mother        tachycardia.Marland KitchenMarland Kitchen?afib    Stroke Father        after hernia suegery   Prostate cancer Father    Other Father        global transient amnesia, unclear source   Atrial fibrillation Sister    Healthy Brother    Healthy Brother    Coronary artery disease Paternal Grandmother    Heart attack Paternal Grandmother 86   Brain cancer Maternal Grandfather        ?   Cancer Paternal Grandfather        ?   Breast cancer Maternal Grandmother     Past Surgical History:  Procedure Laterality Date   BIOPSY  01/19/2019   Procedure: BIOPSY;  Surgeon: Lemar Lofty., MD;  Location: [facility] ENDOSCOPY;  Service: Gastroenterology;;   CHOLECYSTECTOMY N/A 07/28/2016   Procedure: LAPAROSCOPIC CHOLECYSTECTOMY WITH INTRAOPERATIVE CHOLANGIOGRAM;  Surgeon: Rodman Pickle, MD;  Location: [facility] OR;  Service: General;  Laterality: N/A;   ESOPHAGOGASTRODUODENOSCOPY (EGD) WITH PROPOFOL N/A 08/13/2018   Procedure: ESOPHAGOGASTRODUODENOSCOPY (EGD) WITH PROPOFOL;  Surgeon: Lemar Lofty., MD;  Location: [facility] ENDOSCOPY;  Service: Gastroenterology;  Laterality: N/A;   ESOPHAGOGASTRODUODENOSCOPY (EGD) WITH PROPOFOL N/A 01/19/2019   Procedure: ESOPHAGOGASTRODUODENOSCOPY (EGD) WITH PROPOFOL;  Surgeon: Meridee Score Netty Starring., MD;  Location: [facility] ENDOSCOPY;  Service: Gastroenterology;  Laterality: N/A;   IR PARACENTESIS  08/08/2020   LEEP  1990's   ORIF FOOT FRACTURE  09/2008   L 5th metatarsal   REFRACTIVE SURGERY  2000   TONSILLECTOMY     UPPER ESOPHAGEAL ENDOSCOPIC ULTRASOUND (EUS) N/A 08/13/2018   Procedure: UPPER ESOPHAGEAL ENDOSCOPIC ULTRASOUND (EUS);  Surgeon: Lemar Lofty., MD;  Location: [facility] ENDOSCOPY;  Service: Gastroenterology;  Laterality: N/A;   UPPER ESOPHAGEAL ENDOSCOPIC ULTRASOUND (EUS) N/A 01/19/2019   Procedure: UPPER ESOPHAGEAL ENDOSCOPIC ULTRASOUND (EUS);  Surgeon: Lemar Lofty., MD;  Location: [facility] ENDOSCOPY;  Service: Gastroenterology;  Laterality: N/A;   Social History   Occupational History    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

## 2020-10-27 NOTE — Addendum Note (Signed)
Addended by: Polly Cobia on: 10/27/2020 09:07 AM   Modules accepted: Orders

## 2020-10-31 ENCOUNTER — Encounter: Payer: 59 | Admitting: Physical Therapy

## 2020-11-02 ENCOUNTER — Encounter: Payer: 59 | Admitting: Physical Therapy

## 2020-11-04 ENCOUNTER — Other Ambulatory Visit: Payer: Self-pay

## 2020-11-04 ENCOUNTER — Ambulatory Visit: Payer: 59 | Admitting: Physical Therapy

## 2020-11-04 ENCOUNTER — Other Ambulatory Visit (INDEPENDENT_AMBULATORY_CARE_PROVIDER_SITE_OTHER): Payer: 59

## 2020-11-04 DIAGNOSIS — K7031 Alcoholic cirrhosis of liver with ascites: Secondary | ICD-10-CM

## 2020-11-04 DIAGNOSIS — M25571 Pain in right ankle and joints of right foot: Secondary | ICD-10-CM | POA: Diagnosis not present

## 2020-11-04 DIAGNOSIS — M25671 Stiffness of right ankle, not elsewhere classified: Secondary | ICD-10-CM | POA: Diagnosis not present

## 2020-11-04 DIAGNOSIS — M6281 Muscle weakness (generalized): Secondary | ICD-10-CM | POA: Diagnosis not present

## 2020-11-04 LAB — BASIC METABOLIC PANEL
BUN: 12 mg/dL (ref 6–23)
CO2: 25 mEq/L (ref 19–32)
Calcium: 9 mg/dL (ref 8.4–10.5)
Chloride: 96 mEq/L (ref 96–112)
Creatinine, Ser: 1.15 mg/dL (ref 0.40–1.20)
GFR: 52.78 mL/min — ABNORMAL LOW (ref >60.00)
Glucose, Bld: 214 mg/dL — ABNORMAL HIGH (ref 70–99)
Potassium: 4.2 mEq/L (ref 3.5–5.1)
Sodium: 132 mEq/L — ABNORMAL LOW (ref 135–145)

## 2020-11-04 NOTE — Therapy (Signed)
[facility] Physical Therapy [address] [place], [place], [address] Phone: [phone]   Fax:  [phone]  Physical Therapy Treatment  Patient Details  Name: Mallory Stewart MRN: [id number] Date of Birth: December 09, 1960 Referring Provider (PT): Dr. Aldean Baker   Encounter Date: 11/04/2020   PT End of Session - 11/04/20 0946     Visit Number 4    Number of Visits 18    Date for PT Re-Evaluation 12/27/20    Authorization Type [employer] 20 PT visits per year    Authorization - Visit Number 4    Authorization - Number of Visits 18    Progress Note Due on Visit 10    PT Start Time [phone]   pt arrived late   PT Stop Time 1013    PT Time Calculation (min) 36 min    Activity Tolerance --    Behavior During Therapy [facility] for tasks assessed/performed             Past Medical History:  Diagnosis Date   Alcohol-induced chronic pancreatitis (HCC)    B12 deficiency    Concussion    DDD (degenerative disc disease), cervical    Diabetes (HCC)    DKA, type 2 (HCC)    Gastric outlet obstruction 08/12/2018   Hypertension    Neuropathy    Pancreatic pseudocyst/cyst 05/06/2013   Seasonal allergies     Past Surgical History:  Procedure Laterality Date   BIOPSY  01/19/2019   Procedure: BIOPSY;  Surgeon: Lemar Lofty., MD;  Location: [facility] ENDOSCOPY;  Service: Gastroenterology;;   CHOLECYSTECTOMY N/A 07/28/2016   Procedure: LAPAROSCOPIC CHOLECYSTECTOMY WITH INTRAOPERATIVE CHOLANGIOGRAM;  Surgeon: Rodman Pickle, MD;  Location: [facility] OR;  Service: General;  Laterality: N/A;   ESOPHAGOGASTRODUODENOSCOPY (EGD) WITH PROPOFOL N/A 08/13/2018   Procedure: ESOPHAGOGASTRODUODENOSCOPY (EGD) WITH PROPOFOL;  Surgeon: Lemar Lofty., MD;  Location: [facility] ENDOSCOPY;  Service: Gastroenterology;  Laterality: N/A;   ESOPHAGOGASTRODUODENOSCOPY (EGD) WITH PROPOFOL N/A 01/19/2019   Procedure: ESOPHAGOGASTRODUODENOSCOPY (EGD) WITH PROPOFOL;  Surgeon: Meridee Score Netty Starring., MD;   Location: [facility] ENDOSCOPY;  Service: Gastroenterology;  Laterality: N/A;   IR PARACENTESIS  08/08/2020   LEEP  1990's   ORIF FOOT FRACTURE  09/2008   L 5th metatarsal   REFRACTIVE SURGERY  2000   TONSILLECTOMY     UPPER ESOPHAGEAL ENDOSCOPIC ULTRASOUND (EUS) N/A 08/13/2018   Procedure: UPPER ESOPHAGEAL ENDOSCOPIC ULTRASOUND (EUS);  Surgeon: Lemar Lofty., MD;  Location: [facility] ENDOSCOPY;  Service: Gastroenterology;  Laterality: N/A;   UPPER ESOPHAGEAL ENDOSCOPIC ULTRASOUND (EUS) N/A 01/19/2019   Procedure: UPPER ESOPHAGEAL ENDOSCOPIC ULTRASOUND (EUS);  Surgeon: Lemar Lofty., MD;  Location: [facility] ENDOSCOPY;  Service: Gastroenterology;  Laterality: N/A;    There were no vitals filed for this visit.   Subjective Assessment - 11/04/20 0940     Subjective Pt reports she has "excruitating pain" in Rt ant ankle with weight bearing with boot on.  She hasn't used wheel chair in 2 days.  She ambulates into therapy with boot on and RW (observed with step through pattern).    Currently in Pain? Yes    Pain Score 8    5/10 at rest.   Pain Location Ankle    Pain Orientation Right    Pain Descriptors / Indicators Tender;Tightness    Aggravating Factors  weight bearing    Pain Relieving Factors elevation, rest.                [facility] PT Assessment - 11/04/20 0001  Assessment   Medical Diagnosis Rt ankle ORIF s/p trimalleolar fracture    Referring Provider (PT) Dr. Aldean Baker    Onset Date/Surgical Date 09/10/20      AROM   Right Ankle Dorsiflexion -3    Right Ankle Plantar Flexion 39    Right Ankle Inversion 12    Right Ankle Eversion 12      PROM   Right Ankle Dorsiflexion 4   long sitting, pulling with strap             [facility] Adult PT Treatment/Exercise - 11/04/20 0001       Ambulation/Gait   Curb 5: Supervision   SBA for safety   Curb Details (indicate cue type and reason) cues for RW placement and sequence. Forward step up/down.  Pt given cues - "up with good,  down with bad".      Ankle Exercises: Seated   Ankle Circles/Pumps AROM;Right;5 reps    Heel Raises Both;10 reps    Toe Raise 10 reps    Heel Slides Right;10 reps   with overpressure into thigh   Other Seated Ankle Exercises rocker board - DF/PF x10, CW/CCW x 10 each (slowly)    Other Seated Ankle Exercises Long sitting: Rt ankle Inversion, eversion with green band x 10 each. cues to keep leg still.      Ankle Exercises: Stretches   Other Stretch seated strap DF stretch to Rt ankle in long sitting with knee extension , 30 sec x 3 reps      Ankle Exercises: Aerobic   Nustep deferred; pt reports she was already warmed up.                      PT Short Term Goals - 10/18/20 1306       PT SHORT TERM GOAL #1   Title Patient will demonstrate independent use of home exercise program to maintain progress from in clinic treatments.    Time 3    Period Weeks    Status New    Target Date 11/08/20               PT Long Term Goals - 10/18/20 1359       PT LONG TERM GOAL #1   Title Patient will demonstrate/report pain at worst less than or equal to 2/10 to facilitate minimal limitation in daily activity secondary to pain symptoms.    Time 10    Period Weeks    Status New    Target Date 12/27/20      PT LONG TERM GOAL #2   Title Patient will demonstrate independent use of home exercise program to facilitate ability to maintain/progress functional gains from skilled physical therapy services.    Time 10    Period Weeks    Status New    Target Date 12/27/20      PT LONG TERM GOAL #3   Title Pt. will demonstrate FOTO outcome > or = 72 to indicated reduced disability due to condition.    Time 10    Period Weeks    Status New    Target Date 12/27/20      PT LONG TERM GOAL #4   Title Pt. will demonstrate independent community ambulation > 300 ft s deviation to facilitate usual daily walking at PLOF.    Time 10    Period Weeks    Status New    Target Date  12/27/20      PT LONG  TERM GOAL #5   Title Pt. will demonstrate Rt ankle df > = 5 degrees, inversion/eversion > 25 degrees to facilitate usual mobility for daily walking, standing, transfers at PLOF.    Time 10    Period Weeks    Status New    Target Date 12/27/20      Additional Long Term Goals   Additional Long Term Goals Yes      PT LONG TERM GOAL #6   Title Pt. will demonstrate Rt ankle MMT 5/5 throughout to facilitate usual daily and recreational activity at PLOF.    Time 10    Period Weeks    Status New    Target Date 12/27/20      PT LONG TERM GOAL #7   Title Pt. will demonstrate single leg stance bilateral > 10 seconds to facilitate improved stability in ambulation.    Time 10    Period Weeks    Status New    Target Date 12/27/20                   Plan - 11/04/20 1001     Clinical Impression Statement Pt's Rt ankle DF ROM is gradually improving.  She tolerated seated exercises well, without increase in pain. Required minor cues for form for band exercises (HEP).  Encouraged gradual WBAT in boot and elevating LE at night to reduce end of day swelling.    Personal Factors and Comorbidities Comorbidity 3+    Comorbidities DDD cervical, HTN, extensive vestibular complication history    Examination-Activity Limitations Bend;Squat;Stairs;Carry;Stand;Transfers;Dressing;Locomotion Level    Examination-Participation Restrictions Cleaning;Community Activity;Shop;Driving;Yard Work;Laundry;Occupation    Stability/Clinical Decision Making Evolving/Moderate complexity    Rehab Potential Good    PT Frequency 2x / week    PT Duration Other (comment)   10 weeks   PT Treatment/Interventions ADLs/Self Care Home Management;Cryotherapy;Electrical Stimulation;Iontophoresis 4mg /ml Dexamethasone;Moist Heat;Balance training;Therapeutic exercise;Therapeutic activities;Functional mobility training;Stair training;Gait training;DME Instruction;Ultrasound;Neuromuscular  re-education;Patient/family education;Passive range of motion;Taping;Vasopneumatic Device;Manual techniques;Canalith Repostioning;Vestibular    PT Next Visit Plan Manual intervention for Rt ankle jt mobility gains, active movement gains, progress WBAT.  reassess vertigo PRN    PT Home Exercise Plan [email or web address]    Consulted and Agree with Plan of Care Patient             Patient will benefit from skilled therapeutic intervention in order to improve the following deficits and impairments:  Abnormal gait, Decreased endurance, Hypomobility, Increased edema, Decreased activity tolerance, Decreased strength, Pain, Decreased balance, Decreased mobility, Difficulty walking, Impaired perceived functional ability, Improper body mechanics, Impaired flexibility, Decreased coordination, Decreased range of motion, Dizziness  Visit Diagnosis: Pain in right ankle and joints of right foot  Muscle weakness (generalized)  Stiffness of ankle joint, right     Problem List Patient Active Problem List   Diagnosis Date Noted   Portal hypertension (HCC) 09/30/2020   Dilated bile duct 09/30/2020   Alcoholic cirrhosis of liver with ascites (HCC) 09/29/2020   abdominal acites 09/17/2020   Right foot pain 09/17/2020   Laceration of lesser toe of right foot without foreign body present or damage to nail 09/17/2020   Acute right ankle pain 09/17/2020   Frequent falls 09/14/2020   Closed fracture of ankle 09/14/2020   Foot sprain, right, subsequent encounter 08/26/2020   Sprain of anterior talofibular ligament of right ankle 08/26/2020   Splenomegaly 08/09/2020   Malnutrition of moderate degree (HCC) 08/08/2020   Transaminitis 08/06/2020   Serum total bilirubin elevated 08/06/2020   Fall at home,  initial encounter 08/06/2020   Generalized weakness 08/05/2020   Idiopathic peripheral neuropathy 07/13/2020   Type 2 diabetes mellitus with diabetic polyneuropathy, with long-term current use of insulin (HCC)  07/13/2020   Persistent cough for 3 weeks or longer 07/04/2020   Low HDL (under 40) 06/21/2020   Post-nasal drip 06/21/2020   Allergic rhinitis 06/21/2020   Elevated alkaline phosphatase level 06/21/2020   Duodenitis - edema ? andgioedema 10/30/2018   Chronic alcoholic pancreatitis (HCC) 09/02/2018   Diabetes mellitus with circulatory complication, HTN (HCC) 08/22/2018   Dizziness 12/26/2017   Chronic pain 05/17/2017   Osteoarthritis of spine with radiculopathy, cervical region 09/17/2016   B12 deficiency 10/21/2014   Neuropathic pain of both legs 02/11/2014   Hepatic steatosis 04/27/2013   Hyperlipidemia associated with type 2 diabetes mellitus (HCC) 11/03/2008   Hypertension associated with diabetes (HCC) 09/29/2008   PAP SMEAR, ABNORMAL 09/29/2008   Mayer Camel, PTA 11/04/20 12:25 PM   [facility] Physical Therapy [address] [place], [place], [address] Phone: [phone]   Fax:  [phone]  Name: MONZERRAT WELLEN MRN: [id number] Date of Birth: [birth year]

## 2020-11-07 ENCOUNTER — Other Ambulatory Visit: Payer: Self-pay | Admitting: Family Medicine

## 2020-11-07 NOTE — Telephone Encounter (Signed)
Last office visit 08/26/20 for follow up foot injury.  Last refilled Alprazolam 08/30/20 for #20 with no refills.  Amitriptyline 07/19/20 for #30 with 3 refills.  No future appointments with PCP.

## 2020-11-08 ENCOUNTER — Encounter: Payer: 59 | Admitting: Rehabilitative and Restorative Service Providers"

## 2020-11-08 ENCOUNTER — Telehealth: Payer: Self-pay | Admitting: Orthopedic Surgery

## 2020-11-08 MED ORDER — OXYCODONE HCL 5 MG PO TABS: 5.0000 mg | ORAL_TABLET | Freq: Two times a day (BID) | ORAL | 0 refills | Status: DC | PRN

## 2020-11-08 NOTE — Telephone Encounter (Signed)
5/147/22 out of state surgery ORIF right ankle fx    pt is requesting refill on pain medication please advise.

## 2020-11-08 NOTE — Telephone Encounter (Signed)
Pt requesting refill on oxycodone   CB [phone]

## 2020-11-08 NOTE — Addendum Note (Signed)
Addended by: Barnie Del R on: 11/08/2020 09:24 AM   Modules accepted: Orders

## 2020-11-11 ENCOUNTER — Ambulatory Visit: Payer: 59 | Admitting: Physical Therapy

## 2020-11-11 ENCOUNTER — Other Ambulatory Visit: Payer: Self-pay

## 2020-11-11 ENCOUNTER — Encounter: Payer: Self-pay | Admitting: Physical Therapy

## 2020-11-11 DIAGNOSIS — M6281 Muscle weakness (generalized): Secondary | ICD-10-CM

## 2020-11-11 DIAGNOSIS — M25671 Stiffness of right ankle, not elsewhere classified: Secondary | ICD-10-CM

## 2020-11-11 DIAGNOSIS — R262 Difficulty in walking, not elsewhere classified: Secondary | ICD-10-CM | POA: Diagnosis not present

## 2020-11-11 DIAGNOSIS — M25571 Pain in right ankle and joints of right foot: Secondary | ICD-10-CM

## 2020-11-11 DIAGNOSIS — R2681 Unsteadiness on feet: Secondary | ICD-10-CM

## 2020-11-11 NOTE — Therapy (Signed)
[facility] [address] [place], [place], [address] Phone: [phone]   Fax:  [phone]  Physical Therapy Treatment  Patient Details  Name: Mallory Stewart MRN: [id number] Date of Birth: 1961/03/14 Referring Provider (PT): Dr. Aldean Baker   Encounter Date: 11/11/2020   PT End of Session - 11/11/20 1103     Visit Number 5    Number of Visits 18    Date for PT Re-Evaluation 12/27/20    Authorization Type [employer] 20 PT visits per year    Authorization - Visit Number 5    Authorization - Number of Visits 18    Progress Note Due on Visit 10    PT Start Time 1103    PT Stop Time 1152    PT Time Calculation (min) 49 min    Activity Tolerance Patient tolerated treatment well    Behavior During Therapy [facility] for tasks assessed/performed             Past Medical History:  Diagnosis Date   Alcohol-induced chronic pancreatitis (HCC)    B12 deficiency    Concussion    DDD (degenerative disc disease), cervical    Diabetes (HCC)    DKA, type 2 (HCC)    Gastric outlet obstruction 08/12/2018   Hypertension    Neuropathy    Pancreatic pseudocyst/cyst 05/06/2013   Seasonal allergies     Past Surgical History:  Procedure Laterality Date   BIOPSY  01/19/2019   Procedure: BIOPSY;  Surgeon: Lemar Lofty., MD;  Location: [facility] ENDOSCOPY;  Service: Gastroenterology;;   CHOLECYSTECTOMY N/A 07/28/2016   Procedure: LAPAROSCOPIC CHOLECYSTECTOMY WITH INTRAOPERATIVE CHOLANGIOGRAM;  Surgeon: Rodman Pickle, MD;  Location: [facility] OR;  Service: General;  Laterality: N/A;   ESOPHAGOGASTRODUODENOSCOPY (EGD) WITH PROPOFOL N/A 08/13/2018   Procedure: ESOPHAGOGASTRODUODENOSCOPY (EGD) WITH PROPOFOL;  Surgeon: Lemar Lofty., MD;  Location: [facility] ENDOSCOPY;  Service: Gastroenterology;  Laterality: N/A;   ESOPHAGOGASTRODUODENOSCOPY (EGD) WITH PROPOFOL N/A 01/19/2019   Procedure: ESOPHAGOGASTRODUODENOSCOPY (EGD) WITH PROPOFOL;  Surgeon: Meridee Score Netty Starring., MD;  Location: [facility] ENDOSCOPY;  Service: Gastroenterology;  Laterality: N/A;   IR PARACENTESIS  08/08/2020   LEEP  1990's   ORIF FOOT FRACTURE  09/2008   L 5th metatarsal   REFRACTIVE SURGERY  2000   TONSILLECTOMY     UPPER ESOPHAGEAL ENDOSCOPIC ULTRASOUND (EUS) N/A 08/13/2018   Procedure: UPPER ESOPHAGEAL ENDOSCOPIC ULTRASOUND (EUS);  Surgeon: Lemar Lofty., MD;  Location: [facility] ENDOSCOPY;  Service: Gastroenterology;  Laterality: N/A;   UPPER ESOPHAGEAL ENDOSCOPIC ULTRASOUND (EUS) N/A 01/19/2019   Procedure: UPPER ESOPHAGEAL ENDOSCOPIC ULTRASOUND (EUS);  Surgeon: Lemar Lofty., MD;  Location: [facility] ENDOSCOPY;  Service: Gastroenterology;  Laterality: N/A;    There were no vitals filed for this visit.   Subjective Assessment - 11/11/20 1109     Subjective pt reports she has pain with wbing on her foot, she is wearing her boot at all times.                [facility] PT Assessment - 11/11/20 0001       AROM   Right Ankle Dorsiflexion -2      PROM   Right Ankle Dorsiflexion 5                           [facility] Adult PT Treatment/Exercise - 11/11/20 0001       Self-Care   Self-Care Other Self-Care Comments    Other Self-Care Comments  pt  encouraged to perform more AROM of the ankle to help with edema management as well as improve ankle motion.      Manual Therapy   Manual Therapy Joint mobilization;Soft tissue mobilization    Joint Mobilization grade III post talus mobs, calcaneal rocks, tarsal mobs and proximal and distal fibular mobs    Soft tissue mobilization STM to gastroc soleious with passive release to trigger points.      Ankle Exercises: Seated   BAPS Sitting;Level 2   clock work   Other Seated Ankle Exercises stretch into DF with towel                      PT Short Term Goals - 10/18/20 1306       PT SHORT TERM GOAL #1   Title Patient will demonstrate independent use of home exercise program to maintain progress from in  clinic treatments.    Time 3    Period Weeks    Status New    Target Date 11/08/20               PT Long Term Goals - 10/18/20 1359       PT LONG TERM GOAL #1   Title Patient will demonstrate/report pain at worst less than or equal to 2/10 to facilitate minimal limitation in daily activity secondary to pain symptoms.    Time 10    Period Weeks    Status New    Target Date 12/27/20      PT LONG TERM GOAL #2   Title Patient will demonstrate independent use of home exercise program to facilitate ability to maintain/progress functional gains from skilled physical therapy services.    Time 10    Period Weeks    Status New    Target Date 12/27/20      PT LONG TERM GOAL #3   Title Pt. will demonstrate FOTO outcome > or = 72 to indicated reduced disability due to condition.    Time 10    Period Weeks    Status New    Target Date 12/27/20      PT LONG TERM GOAL #4   Title Pt. will demonstrate independent community ambulation > 300 ft s deviation to facilitate usual daily walking at PLOF.    Time 10    Period Weeks    Status New    Target Date 12/27/20      PT LONG TERM GOAL #5   Title Pt. will demonstrate Rt ankle df > = 5 degrees, inversion/eversion > 25 degrees to facilitate usual mobility for daily walking, standing, transfers at PLOF.    Time 10    Period Weeks    Status New    Target Date 12/27/20      Additional Long Term Goals   Additional Long Term Goals Yes      PT LONG TERM GOAL #6   Title Pt. will demonstrate Rt ankle MMT 5/5 throughout to facilitate usual daily and recreational activity at PLOF.    Time 10    Period Weeks    Status New    Target Date 12/27/20      PT LONG TERM GOAL #7   Title Pt. will demonstrate single leg stance bilateral > 10 seconds to facilitate improved stability in ambulation.    Time 10    Period Weeks    Status New    Target Date 12/27/20  Plan - 11/11/20 1203     Clinical Impression  Statement Pt is very hypomobile in her Rt ankle in all directions , she needs more manual work and mobilizations.  She did have discomfort with these however was able to tolerate grade III mobs.  Slight increase in ankle DF at end of tx.  Anticipate big improvement in motion once she is out of her boot.  Pt declined warm up work - all ready warm and reports she will ice at home.    Rehab Potential Good    PT Frequency 2x / week    PT Duration Other (comment)    PT Treatment/Interventions ADLs/Self Care Home Management;Cryotherapy;Electrical Stimulation;Iontophoresis 4mg /ml Dexamethasone;Moist Heat;Balance training;Therapeutic exercise;Therapeutic activities;Functional mobility training;Stair training;Gait training;DME Instruction;Ultrasound;Neuromuscular re-education;Patient/family education;Passive range of motion;Taping;Vasopneumatic Device;Manual techniques;Canalith Repostioning;Vestibular    PT Next Visit Plan mobilizations of Rt ankle and lower leg. progress WBAT in boot    PT Home Exercise Plan [email or web address]    Consulted and Agree with Plan of Care Patient             Patient will benefit from skilled therapeutic intervention in order to improve the following deficits and impairments:  Abnormal gait, Decreased endurance, Hypomobility, Increased edema, Decreased activity tolerance, Decreased strength, Pain, Decreased balance, Decreased mobility, Difficulty walking, Impaired perceived functional ability, Improper body mechanics, Impaired flexibility, Decreased coordination, Decreased range of motion, Dizziness  Visit Diagnosis: Pain in right ankle and joints of right foot  Muscle weakness (generalized)  Stiffness of ankle joint, right  Difficulty in walking, not elsewhere classified  Unsteadiness on feet     Problem List Patient Active Problem List   Diagnosis Date Noted   Portal hypertension (HCC) 09/30/2020   Dilated bile duct 09/30/2020   Alcoholic cirrhosis of liver with  ascites (HCC) 09/29/2020   abdominal acites 09/17/2020   Right foot pain 09/17/2020   Laceration of lesser toe of right foot without foreign body present or damage to nail 09/17/2020   Acute right ankle pain 09/17/2020   Frequent falls 09/14/2020   Closed fracture of ankle 09/14/2020   Foot sprain, right, subsequent encounter 08/26/2020   Sprain of anterior talofibular ligament of right ankle 08/26/2020   Splenomegaly 08/09/2020   Malnutrition of moderate degree (HCC) 08/08/2020   Transaminitis 08/06/2020   Serum total bilirubin elevated 08/06/2020   Fall at home, initial encounter 08/06/2020   Generalized weakness 08/05/2020   Idiopathic peripheral neuropathy 07/13/2020   Type 2 diabetes mellitus with diabetic polyneuropathy, with long-term current use of insulin (HCC) 07/13/2020   Persistent cough for 3 weeks or longer 07/04/2020   Low HDL (under 40) 06/21/2020   Post-nasal drip 06/21/2020   Allergic rhinitis 06/21/2020   Elevated alkaline phosphatase level 06/21/2020   Duodenitis - edema ? andgioedema 10/30/2018   Chronic alcoholic pancreatitis (HCC) 09/02/2018   Diabetes mellitus with circulatory complication, HTN (HCC) 08/22/2018   Dizziness 12/26/2017   Chronic pain 05/17/2017   Osteoarthritis of spine with radiculopathy, cervical region 09/17/2016   B12 deficiency 10/21/2014   Neuropathic pain of both legs 02/11/2014   Hepatic steatosis 04/27/2013   Hyperlipidemia associated with type 2 diabetes mellitus (HCC) 11/03/2008   Hypertension associated with diabetes (HCC) 09/29/2008   PAP SMEAR, ABNORMAL 09/29/2008    10/01/2008 PT 11/11/2020, 12:06 PM  [facility] Physical Therapy [address] [place], [place], [place] Phone: [phone]   Fax:  [phone]  Name: Mallory Stewart MRN: [name] Date of Birth: 05-16-1960

## 2020-11-15 ENCOUNTER — Ambulatory Visit: Payer: 59 | Admitting: Rehabilitative and Restorative Service Providers"

## 2020-11-15 ENCOUNTER — Encounter: Payer: Self-pay | Admitting: Rehabilitative and Restorative Service Providers"

## 2020-11-15 ENCOUNTER — Other Ambulatory Visit: Payer: Self-pay

## 2020-11-15 DIAGNOSIS — M25671 Stiffness of right ankle, not elsewhere classified: Secondary | ICD-10-CM

## 2020-11-15 DIAGNOSIS — R262 Difficulty in walking, not elsewhere classified: Secondary | ICD-10-CM | POA: Diagnosis not present

## 2020-11-15 DIAGNOSIS — R42 Dizziness and giddiness: Secondary | ICD-10-CM

## 2020-11-15 DIAGNOSIS — H8113 Benign paroxysmal vertigo, bilateral: Secondary | ICD-10-CM

## 2020-11-15 DIAGNOSIS — M25571 Pain in right ankle and joints of right foot: Secondary | ICD-10-CM | POA: Diagnosis not present

## 2020-11-15 DIAGNOSIS — R2681 Unsteadiness on feet: Secondary | ICD-10-CM

## 2020-11-15 DIAGNOSIS — M6281 Muscle weakness (generalized): Secondary | ICD-10-CM

## 2020-11-15 NOTE — Therapy (Signed)
[facility] Physical Therapy [address] [place], [place], [address] Phone: [phone]   Fax:  [phone]  Physical Therapy Treatment  Patient Details  Name: Mallory Stewart MRN: [id number] Date of Birth: 08-Feb-1961 Referring Provider (PT): Dr. Aldean Baker   Encounter Date: 11/15/2020   PT End of Session - 11/15/20 1059     Visit Number 6    Number of Visits 18    Date for PT Re-Evaluation 12/27/20    Authorization Type U[employer] 20 PT visits per year    Authorization - Visit Number 6    Authorization - Number of Visits 18    Progress Note Due on Visit 10    PT Start Time 1100    PT Stop Time 1140    PT Time Calculation (min) 40 min    Activity Tolerance Patient tolerated treatment well    Behavior During Therapy [facility] for tasks assessed/performed             Past Medical History:  Diagnosis Date   Alcohol-induced chronic pancreatitis (HCC)    B12 deficiency    Concussion    DDD (degenerative disc disease), cervical    Diabetes (HCC)    DKA, type 2 (HCC)    Gastric outlet obstruction 08/12/2018   Hypertension    Neuropathy    Pancreatic pseudocyst/cyst 05/06/2013   Seasonal allergies     Past Surgical History:  Procedure Laterality Date   BIOPSY  01/19/2019   Procedure: BIOPSY;  Surgeon: Lemar Lofty., MD;  Location: [facility] ENDOSCOPY;  Service: Gastroenterology;;   CHOLECYSTECTOMY N/A 07/28/2016   Procedure: LAPAROSCOPIC CHOLECYSTECTOMY WITH INTRAOPERATIVE CHOLANGIOGRAM;  Surgeon: Rodman Pickle, MD;  Location: [facility] OR;  Service: General;  Laterality: N/A;   ESOPHAGOGASTRODUODENOSCOPY (EGD) WITH PROPOFOL N/A 08/13/2018   Procedure: ESOPHAGOGASTRODUODENOSCOPY (EGD) WITH PROPOFOL;  Surgeon: Lemar Lofty., MD;  Location: [facility] ENDOSCOPY;  Service: Gastroenterology;  Laterality: N/A;   ESOPHAGOGASTRODUODENOSCOPY (EGD) WITH PROPOFOL N/A 01/19/2019   Procedure: ESOPHAGOGASTRODUODENOSCOPY (EGD) WITH PROPOFOL;  Surgeon: Meridee Score Netty Starring., MD;  Location: [facility] ENDOSCOPY;  Service: Gastroenterology;  Laterality: N/A;   IR PARACENTESIS  08/08/2020   LEEP  1990's   ORIF FOOT FRACTURE  09/2008   L 5th metatarsal   REFRACTIVE SURGERY  2000   TONSILLECTOMY     UPPER ESOPHAGEAL ENDOSCOPIC ULTRASOUND (EUS) N/A 08/13/2018   Procedure: UPPER ESOPHAGEAL ENDOSCOPIC ULTRASOUND (EUS);  Surgeon: Lemar Lofty., MD;  Location: [facility] ENDOSCOPY;  Service: Gastroenterology;  Laterality: N/A;   UPPER ESOPHAGEAL ENDOSCOPIC ULTRASOUND (EUS) N/A 01/19/2019   Procedure: UPPER ESOPHAGEAL ENDOSCOPIC ULTRASOUND (EUS);  Surgeon: Lemar Lofty., MD;  Location: [facility] ENDOSCOPY;  Service: Gastroenterology;  Laterality: N/A;    There were no vitals filed for this visit.       [facility] PT Assessment - 11/15/20 0001       Strength   Right Ankle Dorsiflexion 4/5    Right Ankle Plantar Flexion 2+/5    Right Ankle Inversion 5/5    Right Ankle Eversion 4/5                           [facility] Adult PT Treatment/Exercise - 11/15/20 0001       Ambulation/Gait   Gait Comments Ambulation c SPC in Lt UE c boot on Rt ankle, cues for techniques/sequencing. Ambulation primarily c step to gait pattern 100 ft c SBA      Manual Therapy   Manual therapy comments g3 ap  mobs, medial/lateral subtalar joint mobs on Rt, passive DF gastroc stretch      Ankle Exercises: Seated   Other Seated Ankle Exercises green band 4 way 2 x 15 Rt                      PT Short Term Goals - 10/18/20 1306       PT SHORT TERM GOAL #1   Title Patient will demonstrate independent use of home exercise program to maintain progress from in clinic treatments.    Time 3    Period Weeks    Status New    Target Date 11/08/20               PT Long Term Goals - 11/15/20 1141       PT LONG TERM GOAL #1   Title Patient will demonstrate/report pain at worst less than or equal to 2/10 to facilitate minimal limitation in daily activity secondary to  pain symptoms.    Time 10    Period Weeks    Status On-going    Target Date 12/27/20      PT LONG TERM GOAL #2   Title Patient will demonstrate independent use of home exercise program to facilitate ability to maintain/progress functional gains from skilled physical therapy services.    Time 10    Period Weeks    Status On-going    Target Date 12/27/20      PT LONG TERM GOAL #3   Title Pt. will demonstrate FOTO outcome > or = 72 to indicated reduced disability due to condition.    Time 10    Period Weeks    Status On-going    Target Date 12/27/20      PT LONG TERM GOAL #4   Title Pt. will demonstrate independent community ambulation > 300 ft s deviation to facilitate usual daily walking at PLOF.    Time 10    Period Weeks    Status On-going    Target Date 12/27/20      PT LONG TERM GOAL #5   Title Pt. will demonstrate Rt ankle df > = 5 degrees, inversion/eversion > 25 degrees to facilitate usual mobility for daily walking, standing, transfers at PLOF.    Time 10    Period Weeks    Status On-going    Target Date 12/27/20      PT LONG TERM GOAL #6   Title Pt. will demonstrate Rt ankle MMT 5/5 throughout to facilitate usual daily and recreational activity at PLOF.    Time 10    Period Weeks    Status On-going    Target Date 12/27/20      PT LONG TERM GOAL #7   Title Pt. will demonstrate single leg stance bilateral > 10 seconds to facilitate improved stability in ambulation.    Time 10    Period Weeks    Status On-going    Target Date 12/27/20                   Plan - 11/15/20 1140     Clinical Impression Statement Continued focus on use of ther ex and manual intervention to promote gains in active mobility of Rt ankle at this time.  Noted DF restriction that will present as difficult for heel to toe gait progression when allowed out of boot.  Discussed and educated Pt. on neural sensitivity that may be happening to contribute to severity/irritability of  symptoms.  Fair overall tolerance to activity today.    Rehab Potential Good    PT Frequency 2x / week    PT Duration Other (comment)    PT Treatment/Interventions ADLs/Self Care Home Management;Cryotherapy;Electrical Stimulation;Iontophoresis 4mg /ml Dexamethasone;Moist Heat;Balance training;Therapeutic exercise;Therapeutic activities;Functional mobility training;Stair training;Gait training;DME Instruction;Ultrasound;Neuromuscular re-education;Patient/family education;Passive range of motion;Taping;Vasopneumatic Device;Manual techniques;Canalith Repostioning;Vestibular    PT Next Visit Plan SPC use in clinic, ankle manual and ther ex for mobility gains.  Check on WB status in boot/no boot from MD office    PT Home Exercise Plan [email or web address]    Consulted and Agree with Plan of Care Patient             Patient will benefit from skilled therapeutic intervention in order to improve the following deficits and impairments:  Abnormal gait, Decreased endurance, Hypomobility, Increased edema, Decreased activity tolerance, Decreased strength, Pain, Decreased balance, Decreased mobility, Difficulty walking, Impaired perceived functional ability, Improper body mechanics, Impaired flexibility, Decreased coordination, Decreased range of motion, Dizziness  Visit Diagnosis: Pain in right ankle and joints of right foot  Muscle weakness (generalized)  Stiffness of ankle joint, right  Difficulty in walking, not elsewhere classified  Unsteadiness on feet  Dizziness and giddiness  BPPV (benign paroxysmal positional vertigo), bilateral     Problem List Patient Active Problem List   Diagnosis Date Noted   Portal hypertension (HCC) 09/30/2020   Dilated bile duct 09/30/2020   Alcoholic cirrhosis of liver with ascites (HCC) 09/29/2020   abdominal acites 09/17/2020   Right foot pain 09/17/2020   Laceration of lesser toe of right foot without foreign body present or damage to nail 09/17/2020   Acute  right ankle pain 09/17/2020   Frequent falls 09/14/2020   Closed fracture of ankle 09/14/2020   Foot sprain, right, subsequent encounter 08/26/2020   Sprain of anterior talofibular ligament of right ankle 08/26/2020   Splenomegaly 08/09/2020   Malnutrition of moderate degree (HCC) 08/08/2020   Transaminitis 08/06/2020   Serum total bilirubin elevated 08/06/2020   Fall at home, initial encounter 08/06/2020   Generalized weakness 08/05/2020   Idiopathic peripheral neuropathy 07/13/2020   Type 2 diabetes mellitus with diabetic polyneuropathy, with long-term current use of insulin (HCC) 07/13/2020   Persistent cough for 3 weeks or longer 07/04/2020   Low HDL (under 40) 06/21/2020   Post-nasal drip 06/21/2020   Allergic rhinitis 06/21/2020   Elevated alkaline phosphatase level 06/21/2020   Duodenitis - edema ? andgioedema 10/30/2018   Chronic alcoholic pancreatitis (HCC) 09/02/2018   Diabetes mellitus with circulatory complication, HTN (HCC) 08/22/2018   Dizziness 12/26/2017   Chronic pain 05/17/2017   Osteoarthritis of spine with radiculopathy, cervical region 09/17/2016   B12 deficiency 10/21/2014   Neuropathic pain of both legs 02/11/2014   Hepatic steatosis 04/27/2013   Hyperlipidemia associated with type 2 diabetes mellitus (HCC) 11/03/2008   Hypertension associated with diabetes (HCC) 09/29/2008   PAP SMEAR, ABNORMAL 09/29/2008    10/01/2008, PT, DPT, OCS, ATC 11/15/20  11:43 AM    [facility] Physical Therapy [address] [place], [place], [place] Phone: [phone]   Fax:  [phone]  Name: Mallory Stewart MRN: [name] Date of Birth: Dec 16, 1960

## 2020-11-17 ENCOUNTER — Other Ambulatory Visit: Payer: Self-pay

## 2020-11-17 ENCOUNTER — Encounter: Payer: Self-pay | Admitting: Rehabilitative and Restorative Service Providers"

## 2020-11-17 ENCOUNTER — Other Ambulatory Visit: Payer: Self-pay | Admitting: Physician Assistant

## 2020-11-17 ENCOUNTER — Ambulatory Visit: Payer: 59 | Admitting: Rehabilitative and Restorative Service Providers"

## 2020-11-17 ENCOUNTER — Telehealth: Payer: Self-pay | Admitting: Orthopedic Surgery

## 2020-11-17 DIAGNOSIS — M6281 Muscle weakness (generalized): Secondary | ICD-10-CM

## 2020-11-17 DIAGNOSIS — M25571 Pain in right ankle and joints of right foot: Secondary | ICD-10-CM

## 2020-11-17 DIAGNOSIS — H8112 Benign paroxysmal vertigo, left ear: Secondary | ICD-10-CM

## 2020-11-17 DIAGNOSIS — M25671 Stiffness of right ankle, not elsewhere classified: Secondary | ICD-10-CM | POA: Diagnosis not present

## 2020-11-17 DIAGNOSIS — R2681 Unsteadiness on feet: Secondary | ICD-10-CM

## 2020-11-17 DIAGNOSIS — R262 Difficulty in walking, not elsewhere classified: Secondary | ICD-10-CM

## 2020-11-17 DIAGNOSIS — H8113 Benign paroxysmal vertigo, bilateral: Secondary | ICD-10-CM

## 2020-11-17 DIAGNOSIS — R42 Dizziness and giddiness: Secondary | ICD-10-CM

## 2020-11-17 MED ORDER — OXYCODONE HCL 5 MG PO TABS: 5.0000 mg | ORAL_TABLET | Freq: Two times a day (BID) | ORAL | 0 refills | Status: DC | PRN

## 2020-11-17 NOTE — Telephone Encounter (Signed)
Called pt and informed

## 2020-11-17 NOTE — Telephone Encounter (Signed)
Patient called needing Rx refilled Oxycodone    The number to contact patient is [phone]

## 2020-11-17 NOTE — Telephone Encounter (Signed)
refilled 

## 2020-11-17 NOTE — Therapy (Signed)
[facility] Physical Therapy [address] [place], [place], [address] Phone: [phone]   Fax:  [phone]  Physical Therapy Treatment  Patient Details  Name: Mallory Stewart MRN: [id number] Date of Birth: 05-22-1960 Referring Provider (PT): Dr. Aldean Baker   Encounter Date: 11/17/2020   PT End of Session - 11/17/20 1158     Visit Number 7    Number of Visits 18    Date for PT Re-Evaluation 12/27/20    Authorization Type [employer] 20 PT visits per year    Authorization - Visit Number 7    Authorization - Number of Visits 18    Progress Note Due on Visit 10    PT Start Time 1144    PT Stop Time 1223    PT Time Calculation (min) 39 min    Activity Tolerance Patient tolerated treatment well    Behavior During Therapy [facility] for tasks assessed/performed             Past Medical History:  Diagnosis Date   Alcohol-induced chronic pancreatitis (HCC)    B12 deficiency    Concussion    DDD (degenerative disc disease), cervical    Diabetes (HCC)    DKA, type 2 (HCC)    Gastric outlet obstruction 08/12/2018   Hypertension    Neuropathy    Pancreatic pseudocyst/cyst 05/06/2013   Seasonal allergies     Past Surgical History:  Procedure Laterality Date   BIOPSY  01/19/2019   Procedure: BIOPSY;  Surgeon: Lemar Lofty., MD;  Location: [facility] ENDOSCOPY;  Service: Gastroenterology;;   CHOLECYSTECTOMY N/A 07/28/2016   Procedure: LAPAROSCOPIC CHOLECYSTECTOMY WITH INTRAOPERATIVE CHOLANGIOGRAM;  Surgeon: Rodman Pickle, MD;  Location: [facility] OR;  Service: General;  Laterality: N/A;   ESOPHAGOGASTRODUODENOSCOPY (EGD) WITH PROPOFOL N/A 08/13/2018   Procedure: ESOPHAGOGASTRODUODENOSCOPY (EGD) WITH PROPOFOL;  Surgeon: Lemar Lofty., MD;  Location: [facility] ENDOSCOPY;  Service: Gastroenterology;  Laterality: N/A;   ESOPHAGOGASTRODUODENOSCOPY (EGD) WITH PROPOFOL N/A 01/19/2019   Procedure: ESOPHAGOGASTRODUODENOSCOPY (EGD) WITH PROPOFOL;  Surgeon: Meridee Score Netty Starring., MD;  Location: [facility] ENDOSCOPY;  Service: Gastroenterology;  Laterality: N/A;   IR PARACENTESIS  08/08/2020   LEEP  1990's   ORIF FOOT FRACTURE  09/2008   L 5th metatarsal   REFRACTIVE SURGERY  2000   TONSILLECTOMY     UPPER ESOPHAGEAL ENDOSCOPIC ULTRASOUND (EUS) N/A 08/13/2018   Procedure: UPPER ESOPHAGEAL ENDOSCOPIC ULTRASOUND (EUS);  Surgeon: Lemar Lofty., MD;  Location: [facility] ENDOSCOPY;  Service: Gastroenterology;  Laterality: N/A;   UPPER ESOPHAGEAL ENDOSCOPIC ULTRASOUND (EUS) N/A 01/19/2019   Procedure: UPPER ESOPHAGEAL ENDOSCOPIC ULTRASOUND (EUS);  Surgeon: Lemar Lofty., MD;  Location: [facility] ENDOSCOPY;  Service: Gastroenterology;  Laterality: N/A;    There were no vitals filed for this visit.   Subjective Assessment - 11/17/20 1158     Subjective Pt. indicated similar overall complaints.  Able to walk c SPC now.    Currently in Pain? Yes    Pain Score 4     Pain Location Ankle    Pain Orientation Right    Pain Descriptors / Indicators Sore;Tender;Sharp    Pain Type Surgical pain    Pain Onset More than a month ago    Pain Frequency Constant    Aggravating Factors  constant, pressure, movement    Pain Relieving Factors rest                [facility] PT Assessment - 11/17/20 0001       Assessment   Medical Diagnosis Rt  ankle ORIF s/p trimalleolar fracture    Referring Provider (PT) Dr. Aldean Baker    Onset Date/Surgical Date 09/10/20      Restrictions   Other Position/Activity Restrictions WBAT outside of boot      Ambulation/Gait   Gait Comments SPC in Lt UE, WBAT on Rt ankle, SBA 50 ft x 2 c decreased stance, toe off progression on Rt                           [facility] Adult PT Treatment/Exercise - 11/17/20 0001       Therapeutic Activites    Therapeutic Activities Other Therapeutic Activities    Other Therapeutic Activities SPC use with ambulation c no boot c SBA 50 ft x 2 in clinic c mild cues for positioning, step up/down training  on 4 inch step for functional use (good leg up, involved leg down 1st) for pool navigation      Neuro Re-ed    Neuro Re-ed Details  ambulation fwd/back in bars for improved WB control 10 ft x 6 each way      Manual Therapy   Manual therapy comments mobilization c movement to Rt ankle DF in standing c sustained posterior glide talocrural jt 2 x 10      Ankle Exercises: Seated   BAPS Sitting;Level 1   fwd/back 2 x 20, circles 20x cw ,ccw     Ankle Exercises: Aerobic   Nustep Lvl 6 10 mins      Ankle Exercises: Stretches   Other Stretch seated DF c strap c knee in 0 degrees 30 sec x 3 Rt                      PT Short Term Goals - 10/18/20 1306       PT SHORT TERM GOAL #1   Title Patient will demonstrate independent use of home exercise program to maintain progress from in clinic treatments.    Time 3    Period Weeks    Status New    Target Date 11/08/20               PT Long Term Goals - 11/15/20 1141       PT LONG TERM GOAL #1   Title Patient will demonstrate/report pain at worst less than or equal to 2/10 to facilitate minimal limitation in daily activity secondary to pain symptoms.    Time 10    Period Weeks    Status On-going    Target Date 12/27/20      PT LONG TERM GOAL #2   Title Patient will demonstrate independent use of home exercise program to facilitate ability to maintain/progress functional gains from skilled physical therapy services.    Time 10    Period Weeks    Status On-going    Target Date 12/27/20      PT LONG TERM GOAL #3   Title Pt. will demonstrate FOTO outcome > or = 72 to indicated reduced disability due to condition.    Time 10    Period Weeks    Status On-going    Target Date 12/27/20      PT LONG TERM GOAL #4   Title Pt. will demonstrate independent community ambulation > 300 ft s deviation to facilitate usual daily walking at PLOF.    Time 10    Period Weeks    Status On-going    Target Date 12/27/20  PT  LONG TERM GOAL #5   Title Pt. will demonstrate Rt ankle df > = 5 degrees, inversion/eversion > 25 degrees to facilitate usual mobility for daily walking, standing, transfers at PLOF.    Time 10    Period Weeks    Status On-going    Target Date 12/27/20      PT LONG TERM GOAL #6   Title Pt. will demonstrate Rt ankle MMT 5/5 throughout to facilitate usual daily and recreational activity at PLOF.    Time 10    Period Weeks    Status On-going    Target Date 12/27/20      PT LONG TERM GOAL #7   Title Pt. will demonstrate single leg stance bilateral > 10 seconds to facilitate improved stability in ambulation.    Time 10    Period Weeks    Status On-going    Target Date 12/27/20                   Plan - 11/17/20 1221     Clinical Impression Statement Per conversation c PA, Pt. was able to progress to WBAT outside of boot at this time.  Pt. demonstrated fair acceptance to standing but able to perform c SBA and SPC use at this time.  Pt. to benefit from continued skilled PT services to progress strength and mobility and improve independence in ambulation.    Personal Factors and Comorbidities Comorbidity 3+    Comorbidities DDD cervical, HTN, extensive vestibular complication history    Examination-Activity Limitations Bend;Squat;Stairs;Carry;Stand;Transfers;Dressing;Locomotion Level    Examination-Participation Restrictions Cleaning;Community Activity;Shop;Driving;Yard Work;Laundry;Occupation    Rehab Potential Good    PT Frequency 2x / week    PT Duration Other (comment)    PT Treatment/Interventions ADLs/Self Care Home Management;Cryotherapy;Electrical Stimulation;Iontophoresis 4mg /ml Dexamethasone;Moist Heat;Balance training;Therapeutic exercise;Therapeutic activities;Functional mobility training;Stair training;Gait training;DME Instruction;Ultrasound;Neuromuscular re-education;Patient/family education;Passive range of motion;Taping;Vasopneumatic Device;Manual techniques;Canalith  Repostioning;Vestibular    PT Next Visit Plan WBAT outside of boot.  Improve mobility c manual intervention, ther ex and neuro re-ed to improve strength and balance.    PT Home Exercise Plan [email or web address]    Consulted and Agree with Plan of Care Patient             Patient will benefit from skilled therapeutic intervention in order to improve the following deficits and impairments:  Abnormal gait, Decreased endurance, Hypomobility, Increased edema, Decreased activity tolerance, Decreased strength, Pain, Decreased balance, Decreased mobility, Difficulty walking, Impaired perceived functional ability, Improper body mechanics, Impaired flexibility, Decreased coordination, Decreased range of motion, Dizziness  Visit Diagnosis: Pain in right ankle and joints of right foot  Muscle weakness (generalized)  Stiffness of ankle joint, right  Difficulty in walking, not elsewhere classified  Unsteadiness on feet  Dizziness and giddiness  BPPV (benign paroxysmal positional vertigo), bilateral  BPPV (benign paroxysmal positional vertigo), left     Problem List Patient Active Problem List   Diagnosis Date Noted   Portal hypertension (HCC) 09/30/2020   Dilated bile duct 09/30/2020   Alcoholic cirrhosis of liver with ascites (HCC) 09/29/2020   abdominal acites 09/17/2020   Right foot pain 09/17/2020   Laceration of lesser toe of right foot without foreign body present or damage to nail 09/17/2020   Acute right ankle pain 09/17/2020   Frequent falls 09/14/2020   Closed fracture of ankle 09/14/2020   Foot sprain, right, subsequent encounter 08/26/2020   Sprain of anterior talofibular ligament of right ankle 08/26/2020   Splenomegaly 08/09/2020   Malnutrition of moderate  degree (HCC) 08/08/2020   Transaminitis 08/06/2020   Serum total bilirubin elevated 08/06/2020   Fall at home, initial encounter 08/06/2020   Generalized weakness 08/05/2020   Idiopathic peripheral neuropathy 07/13/2020    Type 2 diabetes mellitus with diabetic polyneuropathy, with long-term current use of insulin (HCC) 07/13/2020   Persistent cough for 3 weeks or longer 07/04/2020   Low HDL (under 40) 06/21/2020   Post-nasal drip 06/21/2020   Allergic rhinitis 06/21/2020   Elevated alkaline phosphatase level 06/21/2020   Duodenitis - edema ? andgioedema 10/30/2018   Chronic alcoholic pancreatitis (HCC) 09/02/2018   Diabetes mellitus with circulatory complication, HTN (HCC) 08/22/2018   Dizziness 12/26/2017   Chronic pain 05/17/2017   Osteoarthritis of spine with radiculopathy, cervical region 09/17/2016   B12 deficiency 10/21/2014   Neuropathic pain of both legs 02/11/2014   Hepatic steatosis 04/27/2013   Hyperlipidemia associated with type 2 diabetes mellitus (HCC) 11/03/2008   Hypertension associated with diabetes (HCC) 09/29/2008   PAP SMEAR, ABNORMAL 09/29/2008    Chyrel MassonMichael Kanoa Phillippi, PT, DPT, OCS, ATC 11/17/20  12:30 PM    [facility] [address] [place], [place], [address] Phone: [phone]   Fax:  [phone]  Name: Odelia GageKathleen L Stewart MRN: [id number] Date of Birth: Aug 17, 1960

## 2020-11-22 ENCOUNTER — Other Ambulatory Visit: Payer: Self-pay

## 2020-11-22 ENCOUNTER — Encounter: Payer: Self-pay | Admitting: Rehabilitative and Restorative Service Providers"

## 2020-11-22 ENCOUNTER — Ambulatory Visit: Payer: 59 | Admitting: Rehabilitative and Restorative Service Providers"

## 2020-11-22 DIAGNOSIS — R262 Difficulty in walking, not elsewhere classified: Secondary | ICD-10-CM | POA: Diagnosis not present

## 2020-11-22 DIAGNOSIS — R42 Dizziness and giddiness: Secondary | ICD-10-CM

## 2020-11-22 DIAGNOSIS — M25671 Stiffness of right ankle, not elsewhere classified: Secondary | ICD-10-CM

## 2020-11-22 DIAGNOSIS — R600 Localized edema: Secondary | ICD-10-CM

## 2020-11-22 DIAGNOSIS — R2681 Unsteadiness on feet: Secondary | ICD-10-CM

## 2020-11-22 DIAGNOSIS — H8113 Benign paroxysmal vertigo, bilateral: Secondary | ICD-10-CM

## 2020-11-22 DIAGNOSIS — M25571 Pain in right ankle and joints of right foot: Secondary | ICD-10-CM | POA: Diagnosis not present

## 2020-11-22 DIAGNOSIS — M6281 Muscle weakness (generalized): Secondary | ICD-10-CM | POA: Diagnosis not present

## 2020-11-22 NOTE — Addendum Note (Signed)
Addended by: Chyrel Masson B on: 11/22/2020 11:44 AM   Modules accepted: Orders

## 2020-11-22 NOTE — Therapy (Signed)
[facility] Physical Therapy [address] [place], [place], [address] Phone: [phone]   Fax:  [phone]  Physical Therapy Treatment/Progress Note  Patient Details  Name: Mallory Stewart MRN: [id number] Date of Birth: July 16, 1960 Referring Provider (PT): Dr. Aldean Baker   Encounter Date: 11/22/2020  Progress Note Reporting Period 10/18/2020 to 11/22/2020  See note below for Objective Data and Assessment of Progress/Goals.       PT End of Session - 11/22/20 1105     Visit Number 8    Number of Visits 18    Date for PT Re-Evaluation 12/27/20    Authorization Type U[employer] 20 PT visits per year    Authorization - Visit Number 8    Authorization - Number of Visits 18    Progress Note Due on Visit 10    PT Start Time 1100    PT Stop Time 1148    PT Time Calculation (min) 48 min    Activity Tolerance Patient tolerated treatment well    Behavior During Therapy [facility] for tasks assessed/performed             Past Medical History:  Diagnosis Date   Alcohol-induced chronic pancreatitis (HCC)    B12 deficiency    Concussion    DDD (degenerative disc disease), cervical    Diabetes (HCC)    DKA, type 2 (HCC)    Gastric outlet obstruction 08/12/2018   Hypertension    Neuropathy    Pancreatic pseudocyst/cyst 05/06/2013   Seasonal allergies     Past Surgical History:  Procedure Laterality Date   BIOPSY  01/19/2019   Procedure: BIOPSY;  Surgeon: Lemar Lofty., MD;  Location: [facility] ENDOSCOPY;  Service: Gastroenterology;;   CHOLECYSTECTOMY N/A 07/28/2016   Procedure: LAPAROSCOPIC CHOLECYSTECTOMY WITH INTRAOPERATIVE CHOLANGIOGRAM;  Surgeon: Rodman Pickle, MD;  Location: [facility] OR;  Service: General;  Laterality: N/A;   ESOPHAGOGASTRODUODENOSCOPY (EGD) WITH PROPOFOL N/A 08/13/2018   Procedure: ESOPHAGOGASTRODUODENOSCOPY (EGD) WITH PROPOFOL;  Surgeon: Lemar Lofty., MD;  Location: [facility] ENDOSCOPY;  Service: Gastroenterology;  Laterality: N/A;    ESOPHAGOGASTRODUODENOSCOPY (EGD) WITH PROPOFOL N/A 01/19/2019   Procedure: ESOPHAGOGASTRODUODENOSCOPY (EGD) WITH PROPOFOL;  Surgeon: Meridee Score Netty Starring., MD;  Location: [facility] ENDOSCOPY;  Service: Gastroenterology;  Laterality: N/A;   IR PARACENTESIS  08/08/2020   LEEP  1990's   ORIF FOOT FRACTURE  09/2008   L 5th metatarsal   REFRACTIVE SURGERY  2000   TONSILLECTOMY     UPPER ESOPHAGEAL ENDOSCOPIC ULTRASOUND (EUS) N/A 08/13/2018   Procedure: UPPER ESOPHAGEAL ENDOSCOPIC ULTRASOUND (EUS);  Surgeon: Lemar Lofty., MD;  Location: [facility] ENDOSCOPY;  Service: Gastroenterology;  Laterality: N/A;   UPPER ESOPHAGEAL ENDOSCOPIC ULTRASOUND (EUS) N/A 01/19/2019   Procedure: UPPER ESOPHAGEAL ENDOSCOPIC ULTRASOUND (EUS);  Surgeon: Lemar Lofty., MD;  Location: [facility] ENDOSCOPY;  Service: Gastroenterology;  Laterality: N/A;    There were no vitals filed for this visit.   Subjective Assessment - 11/22/20 1104     Subjective Pt. indicated 6/10 pain today.  Pt. stated she went all yesterday without wearing the boot.  Pt. stated it felt weird to be in shoes again.    Limitations Standing;Walking;House hold activities    Patient Stated Goals Reduce pain, walking independent/swimming, dog walks    Currently in Pain? Yes    Pain Score 6     Pain Location Ankle    Pain Orientation Right    Pain Descriptors / Indicators Sore;Tender;Sharp    Pain Type Surgical pain    Pain Onset More than  a month ago    Pain Frequency Constant    Aggravating Factors  after getting rid of boot    Pain Relieving Factors rest                [facility] PT Assessment - 11/22/20 0001       Assessment   Medical Diagnosis Rt ankle ORIF s/p trimalleolar fracture    Referring Provider (PT) Dr. Aldean Baker    Onset Date/Surgical Date 09/10/20      Restrictions   Other Position/Activity Restrictions WBAT outside of boot      AROM   Right Ankle Dorsiflexion 0   in seated 90 deg knee flexion,   Right Ankle Plantar  Flexion 50    Right Ankle Inversion 11    Right Ankle Eversion 30      Ambulation/Gait   Gait Comments SPC in Lt UE, no boot, reduced stance, toe off progression on Rt ankle                           [facility] Adult PT Treatment/Exercise - 11/22/20 0001       Neuro Re-ed    Neuro Re-ed Details  ambulation fwd/back in // bars 10 ft x 6 each way, tandem ambulation fwd c moderate HHA on bars 10 ft x 10      Modalities   Modalities Vasopneumatic      Vasopneumatic   Number Minutes Vasopneumatic  10 minutes    Vasopnuematic Location  Ankle   Rt   Vasopneumatic Pressure Medium    Vasopneumatic Temperature  34      Ankle Exercises: Aerobic   Nustep Lvl 6 8 mins      Ankle Exercises: Standing   Other Standing Ankle Exercises step up on Rt leg 4 inch step 2 x 10      Ankle Exercises: Seated   Other Seated Ankle Exercises green band 4 way 20 x slow control focus Rt ankle                      PT Short Term Goals - 11/22/20 1129       PT SHORT TERM GOAL #1   Title Patient will demonstrate independent use of home exercise program to maintain progress from in clinic treatments.    Time 3    Period Weeks    Status Achieved    Target Date 11/08/20               PT Long Term Goals - 11/22/20 1126       PT LONG TERM GOAL #1   Title Patient will demonstrate/report pain at worst less than or equal to 2/10 to facilitate minimal limitation in daily activity secondary to pain symptoms.    Time 10    Period Weeks    Status On-going    Target Date 12/27/20      PT LONG TERM GOAL #2   Title Patient will demonstrate independent use of home exercise program to facilitate ability to maintain/progress functional gains from skilled physical therapy services.    Time 10    Period Weeks    Status On-going    Target Date 12/27/20      PT LONG TERM GOAL #3   Title Pt. will demonstrate FOTO outcome > or = 72 to indicated reduced disability due to condition.     Time 10    Period Weeks  Status On-going    Target Date 12/27/20      PT LONG TERM GOAL #4   Title Pt. will demonstrate independent community ambulation > 300 ft s deviation to facilitate usual daily walking at PLOF.    Time 10    Period Weeks    Status On-going    Target Date 12/27/20      PT LONG TERM GOAL #5   Title Pt. will demonstrate Rt ankle df > = 5 degrees, inversion/eversion > 25 degrees to facilitate usual mobility for daily walking, standing, transfers at PLOF.    Time 10    Period Weeks    Status On-going    Target Date 12/27/20      PT LONG TERM GOAL #6   Title Pt. will demonstrate Rt ankle MMT 5/5 throughout to facilitate usual daily and recreational activity at PLOF.    Time 10    Period Weeks    Status On-going    Target Date 12/27/20      PT LONG TERM GOAL #7   Title Pt. will demonstrate single leg stance bilateral > 10 seconds to facilitate improved stability in ambulation.    Time 10    Period Weeks    Status On-going    Target Date 12/27/20                   Plan - 11/22/20 1122     Clinical Impression Statement Pt. has attended 8 visits overall during course of treatment c continued moderate to severe pain levels reported despite overall improvements noted in objective data.  Recent transitioning to ambulation without boot and SPC and at times in clinic without cane has been noted as described.  Pt. to continue to benefit from skilled PT services to address mobility, strength and movement coordination to improve function towards PLOF and goals.    Personal Factors and Comorbidities Comorbidity 3+    Comorbidities DDD cervical, HTN, extensive vestibular complication history    Examination-Activity Limitations Bend;Squat;Stairs;Carry;Stand;Transfers;Dressing;Locomotion Level    Examination-Participation Restrictions Cleaning;Community Activity;Shop;Driving;Yard Work;Laundry;Occupation    Rehab Potential Good    PT Frequency 2x / week    PT  Duration Other (comment)    PT Treatment/Interventions ADLs/Self Care Home Management;Cryotherapy;Electrical Stimulation;Iontophoresis 4mg /ml Dexamethasone;Moist Heat;Balance training;Therapeutic exercise;Therapeutic activities;Functional mobility training;Stair training;Gait training;DME Instruction;Ultrasound;Neuromuscular re-education;Patient/family education;Passive range of motion;Taping;Vasopneumatic Device;Manual techniques;Canalith Repostioning;Vestibular    PT Next Visit Plan WBAT outside of boot.  Continue mobilization c movement for DF gains, inversion gains.  Strengthening and balance included.    PT Home Exercise Plan [email or web address]    Consulted and Agree with Plan of Care Patient             Patient will benefit from skilled therapeutic intervention in order to improve the following deficits and impairments:  Abnormal gait, Decreased endurance, Hypomobility, Increased edema, Decreased activity tolerance, Decreased strength, Pain, Decreased balance, Decreased mobility, Difficulty walking, Impaired perceived functional ability, Improper body mechanics, Impaired flexibility, Decreased coordination, Decreased range of motion, Dizziness  Visit Diagnosis: Pain in right ankle and joints of right foot  Muscle weakness (generalized)  Stiffness of ankle joint, right  Difficulty in walking, not elsewhere classified  Unsteadiness on feet  Dizziness and giddiness  BPPV (benign paroxysmal positional vertigo), bilateral  Localized edema     Problem List Patient Active Problem List   Diagnosis Date Noted   Portal hypertension (HCC) 09/30/2020   Dilated bile duct 09/30/2020   Alcoholic cirrhosis of liver with ascites (HCC) 09/29/2020  abdominal acites 09/17/2020   Right foot pain 09/17/2020   Laceration of lesser toe of right foot without foreign body present or damage to nail 09/17/2020   Acute right ankle pain 09/17/2020   Frequent falls 09/14/2020   Closed fracture of ankle  09/14/2020   Foot sprain, right, subsequent encounter 08/26/2020   Sprain of anterior talofibular ligament of right ankle 08/26/2020   Splenomegaly 08/09/2020   Malnutrition of moderate degree (HCC) 08/08/2020   Transaminitis 08/06/2020   Serum total bilirubin elevated 08/06/2020   Fall at home, initial encounter 08/06/2020   Generalized weakness 08/05/2020   Idiopathic peripheral neuropathy 07/13/2020   Type 2 diabetes mellitus with diabetic polyneuropathy, with long-term current use of insulin (HCC) 07/13/2020   Persistent cough for 3 weeks or longer 07/04/2020   Low HDL (under 40) 06/21/2020   Post-nasal drip 06/21/2020   Allergic rhinitis 06/21/2020   Elevated alkaline phosphatase level 06/21/2020   Duodenitis - edema ? andgioedema 10/30/2018   Chronic alcoholic pancreatitis (HCC) 09/02/2018   Diabetes mellitus with circulatory complication, HTN (HCC) 08/22/2018   Dizziness 12/26/2017   Chronic pain 05/17/2017   Osteoarthritis of spine with radiculopathy, cervical region 09/17/2016   B12 deficiency 10/21/2014   Neuropathic pain of both legs 02/11/2014   Hepatic steatosis 04/27/2013   Hyperlipidemia associated with type 2 diabetes mellitus (HCC) 11/03/2008   Hypertension associated with diabetes (HCC) 09/29/2008   PAP SMEAR, ABNORMAL 09/29/2008    Chyrel MassonMichael Jerral Mccauley, PT, DPT, OCS, ATC 11/22/20  11:41 AM    [facility] [address] [place], [place], [address] Phone: [phone]   Fax:  [phone]  Name: Odelia GageKathleen L Stewart MRN: [id number] Date of Birth: [birth year]

## 2020-11-24 ENCOUNTER — Encounter: Payer: Self-pay | Admitting: Rehabilitative and Restorative Service Providers"

## 2020-11-24 ENCOUNTER — Ambulatory Visit (INDEPENDENT_AMBULATORY_CARE_PROVIDER_SITE_OTHER): Payer: 59 | Admitting: Orthopedic Surgery

## 2020-11-24 ENCOUNTER — Ambulatory Visit: Payer: 59 | Admitting: Rehabilitative and Restorative Service Providers"

## 2020-11-24 ENCOUNTER — Other Ambulatory Visit: Payer: Self-pay

## 2020-11-24 DIAGNOSIS — M6281 Muscle weakness (generalized): Secondary | ICD-10-CM

## 2020-11-24 DIAGNOSIS — R600 Localized edema: Secondary | ICD-10-CM

## 2020-11-24 DIAGNOSIS — R262 Difficulty in walking, not elsewhere classified: Secondary | ICD-10-CM

## 2020-11-24 DIAGNOSIS — R42 Dizziness and giddiness: Secondary | ICD-10-CM

## 2020-11-24 DIAGNOSIS — S82891D Other fracture of right lower leg, subsequent encounter for closed fracture with routine healing: Secondary | ICD-10-CM | POA: Diagnosis not present

## 2020-11-24 DIAGNOSIS — M25671 Stiffness of right ankle, not elsewhere classified: Secondary | ICD-10-CM

## 2020-11-24 DIAGNOSIS — M25871 Other specified joint disorders, right ankle and foot: Secondary | ICD-10-CM

## 2020-11-24 DIAGNOSIS — H8113 Benign paroxysmal vertigo, bilateral: Secondary | ICD-10-CM

## 2020-11-24 DIAGNOSIS — R2681 Unsteadiness on feet: Secondary | ICD-10-CM

## 2020-11-24 DIAGNOSIS — M25571 Pain in right ankle and joints of right foot: Secondary | ICD-10-CM | POA: Diagnosis not present

## 2020-11-24 NOTE — Therapy (Signed)
[facility] Physical Therapy [address] [place], [place], [address] Phone: [phone]   Fax:  [phone]  Physical Therapy Treatment  Patient Details  Name: Mallory Stewart MRN: [id number] Date of Birth: [birth year] Referring Provider (PT): Dr. Aldean Baker   Encounter Date: 11/24/2020   PT End of Session - 11/24/20 1035     Visit Number 9    Number of Visits 18    Date for PT Re-Evaluation 12/27/20    Authorization Type [employer] 20 PT visits per year    Authorization - Visit Number 9    Authorization - Number of Visits 18    Progress Note Due on Visit 18    PT Start Time 1050    PT Stop Time 1126    PT Time Calculation (min) 36 min    Activity Tolerance Patient tolerated treatment well    Behavior During Therapy [facility] for tasks assessed/performed             Past Medical History:  Diagnosis Date   Alcohol-induced chronic pancreatitis (HCC)    B12 deficiency    Concussion    DDD (degenerative disc disease), cervical    Diabetes (HCC)    DKA, type 2 (HCC)    Gastric outlet obstruction 08/12/2018   Hypertension    Neuropathy    Pancreatic pseudocyst/cyst 05/06/2013   Seasonal allergies     Past Surgical History:  Procedure Laterality Date   BIOPSY  01/19/2019   Procedure: BIOPSY;  Surgeon: Lemar Lofty., MD;  Location: [facility] ENDOSCOPY;  Service: Gastroenterology;;   CHOLECYSTECTOMY N/A 07/28/2016   Procedure: LAPAROSCOPIC CHOLECYSTECTOMY WITH INTRAOPERATIVE CHOLANGIOGRAM;  Surgeon: Rodman Pickle, MD;  Location: [facility] OR;  Service: General;  Laterality: N/A;   ESOPHAGOGASTRODUODENOSCOPY (EGD) WITH PROPOFOL N/A 08/13/2018   Procedure: ESOPHAGOGASTRODUODENOSCOPY (EGD) WITH PROPOFOL;  Surgeon: Lemar Lofty., MD;  Location: [facility] ENDOSCOPY;  Service: Gastroenterology;  Laterality: N/A;   ESOPHAGOGASTRODUODENOSCOPY (EGD) WITH PROPOFOL N/A 01/19/2019   Procedure: ESOPHAGOGASTRODUODENOSCOPY (EGD) WITH PROPOFOL;  Surgeon: Meridee Score Netty Starring., MD;  Location: [facility] ENDOSCOPY;  Service: Gastroenterology;  Laterality: N/A;   IR PARACENTESIS  08/08/2020   LEEP  1990's   ORIF FOOT FRACTURE  09/2008   L 5th metatarsal   REFRACTIVE SURGERY  2000   TONSILLECTOMY     UPPER ESOPHAGEAL ENDOSCOPIC ULTRASOUND (EUS) N/A 08/13/2018   Procedure: UPPER ESOPHAGEAL ENDOSCOPIC ULTRASOUND (EUS);  Surgeon: Lemar Lofty., MD;  Location: [facility] ENDOSCOPY;  Service: Gastroenterology;  Laterality: N/A;   UPPER ESOPHAGEAL ENDOSCOPIC ULTRASOUND (EUS) N/A 01/19/2019   Procedure: UPPER ESOPHAGEAL ENDOSCOPIC ULTRASOUND (EUS);  Surgeon: Lemar Lofty., MD;  Location: [facility] ENDOSCOPY;  Service: Gastroenterology;  Laterality: N/A;    There were no vitals filed for this visit.   Subjective Assessment - 11/24/20 1121     Subjective Pt. indicated continued high severity of pain symptoms.  Saw MD today and indicated need for additional surgery to be performed.    Limitations Standing;Walking;House hold activities    Patient Stated Goals Reduce pain, walking independent/swimming, dog walks    Currently in Pain? Yes    Pain Location Ankle    Pain Orientation Right    Pain Descriptors / Indicators Sharp;Tender;Tightness;Sore    Pain Type Surgical pain    Pain Onset More than a month ago    Pain Frequency Constant  [facility] Adult PT Treatment/Exercise - 11/24/20 0001       Vasopneumatic   Number Minutes Vasopneumatic  10 minutes    Vasopnuematic Location  Ankle    Vasopneumatic Pressure Medium    Vasopneumatic Temperature  34      Manual Therapy   Manual therapy comments g2 subtalar jt mobs medial and lateral, PROM Rt ankle      Ankle Exercises: Aerobic   Recumbent Bike Full revolutions 8 mins      Ankle Exercises: Stretches   Other Stretch incline board calf stretch 30 sec x 5 bilateral                      PT Short Term Goals - 11/22/20 1129       PT SHORT TERM GOAL #1   Title  Patient will demonstrate independent use of home exercise program to maintain progress from in clinic treatments.    Time 3    Period Weeks    Status Achieved    Target Date 11/08/20               PT Long Term Goals - 11/22/20 1126       PT LONG TERM GOAL #1   Title Patient will demonstrate/report pain at worst less than or equal to 2/10 to facilitate minimal limitation in daily activity secondary to pain symptoms.    Time 10    Period Weeks    Status On-going    Target Date 12/27/20      PT LONG TERM GOAL #2   Title Patient will demonstrate independent use of home exercise program to facilitate ability to maintain/progress functional gains from skilled physical therapy services.    Time 10    Period Weeks    Status On-going    Target Date 12/27/20      PT LONG TERM GOAL #3   Title Pt. will demonstrate FOTO outcome > or = 72 to indicated reduced disability due to condition.    Time 10    Period Weeks    Status On-going    Target Date 12/27/20      PT LONG TERM GOAL #4   Title Pt. will demonstrate independent community ambulation > 300 ft s deviation to facilitate usual daily walking at PLOF.    Time 10    Period Weeks    Status On-going    Target Date 12/27/20      PT LONG TERM GOAL #5   Title Pt. will demonstrate Rt ankle df > = 5 degrees, inversion/eversion > 25 degrees to facilitate usual mobility for daily walking, standing, transfers at PLOF.    Time 10    Period Weeks    Status On-going    Target Date 12/27/20      PT LONG TERM GOAL #6   Title Pt. will demonstrate Rt ankle MMT 5/5 throughout to facilitate usual daily and recreational activity at PLOF.    Time 10    Period Weeks    Status On-going    Target Date 12/27/20      PT LONG TERM GOAL #7   Title Pt. will demonstrate single leg stance bilateral > 10 seconds to facilitate improved stability in ambulation.    Time 10    Period Weeks    Status On-going    Target Date 12/27/20                    Plan - 11/24/20 1119  Clinical Impression Statement Pt. came to clinic today after MD visit indicated she was going to have to undergo another surgery.  Due to current visits remaining being limited, Pt. is recommend for continued HEP usage with hold of skilled PT until surgery was performed.    Personal Factors and Comorbidities Comorbidity 3+    Comorbidities DDD cervical, HTN, extensive vestibular complication history    Examination-Activity Limitations Bend;Squat;Stairs;Carry;Stand;Transfers;Dressing;Locomotion Level    Examination-Participation Restrictions Cleaning;Community Activity;Shop;Driving;Yard Work;Laundry;Occupation    Rehab Potential Good    PT Frequency 2x / week    PT Duration Other (comment)    PT Treatment/Interventions ADLs/Self Care Home Management;Cryotherapy;Electrical Stimulation;Iontophoresis 4mg /ml Dexamethasone;Moist Heat;Balance training;Therapeutic exercise;Therapeutic activities;Functional mobility training;Stair training;Gait training;DME Instruction;Ultrasound;Neuromuscular re-education;Patient/family education;Passive range of motion;Taping;Vasopneumatic Device;Manual techniques;Canalith Repostioning;Vestibular    PT Next Visit Plan Reassess after surgery (re-evaluation can be billed then with recert possible)    PT Home Exercise Plan [email or web address]    Consulted and Agree with Plan of Care Patient             Patient will benefit from skilled therapeutic intervention in order to improve the following deficits and impairments:  Abnormal gait, Decreased endurance, Hypomobility, Increased edema, Decreased activity tolerance, Decreased strength, Pain, Decreased balance, Decreased mobility, Difficulty walking, Impaired perceived functional ability, Improper body mechanics, Impaired flexibility, Decreased coordination, Decreased range of motion, Dizziness  Visit Diagnosis: Pain in right ankle and joints of right foot  Muscle weakness  (generalized)  Stiffness of ankle joint, right  Difficulty in walking, not elsewhere classified  Unsteadiness on feet  Dizziness and giddiness  BPPV (benign paroxysmal positional vertigo), bilateral  Localized edema     Problem List Patient Active Problem List   Diagnosis Date Noted   Portal hypertension (HCC) 09/30/2020   Dilated bile duct 09/30/2020   Alcoholic cirrhosis of liver with ascites (HCC) 09/29/2020   abdominal acites 09/17/2020   Right foot pain 09/17/2020   Laceration of lesser toe of right foot without foreign body present or damage to nail 09/17/2020   Acute right ankle pain 09/17/2020   Frequent falls 09/14/2020   Closed fracture of ankle 09/14/2020   Foot sprain, right, subsequent encounter 08/26/2020   Sprain of anterior talofibular ligament of right ankle 08/26/2020   Splenomegaly 08/09/2020   Malnutrition of moderate degree (HCC) 08/08/2020   Transaminitis 08/06/2020   Serum total bilirubin elevated 08/06/2020   Fall at home, initial encounter 08/06/2020   Generalized weakness 08/05/2020   Idiopathic peripheral neuropathy 07/13/2020   Type 2 diabetes mellitus with diabetic polyneuropathy, with long-term current use of insulin (HCC) 07/13/2020   Persistent cough for 3 weeks or longer 07/04/2020   Low HDL (under 40) 06/21/2020   Post-nasal drip 06/21/2020   Allergic rhinitis 06/21/2020   Elevated alkaline phosphatase level 06/21/2020   Duodenitis - edema ? andgioedema 10/30/2018   Chronic alcoholic pancreatitis (HCC) 09/02/2018   Diabetes mellitus with circulatory complication, HTN (HCC) 08/22/2018   Dizziness 12/26/2017   Chronic pain 05/17/2017   Osteoarthritis of spine with radiculopathy, cervical region 09/17/2016   B12 deficiency 10/21/2014   Neuropathic pain of both legs 02/11/2014   Hepatic steatosis 04/27/2013   Hyperlipidemia associated with type 2 diabetes mellitus (HCC) 11/03/2008   Hypertension associated with diabetes (HCC)  09/29/2008   PAP SMEAR, ABNORMAL 09/29/2008   Chyrel MassonMichael Kenna Kirn, PT, DPT, OCS, ATC 11/24/20  11:24 AM    [facility] [address] [place], [place], [address] Phone: [phone]   Fax:  [phone]  Name: [name]  CLYDENE BURACK MRN: [id number] Date of Birth: 1961-05-04

## 2020-11-28 ENCOUNTER — Telehealth: Payer: Self-pay | Admitting: Orthopedic Surgery

## 2020-11-28 ENCOUNTER — Telehealth: Payer: Self-pay | Admitting: Internal Medicine

## 2020-11-28 NOTE — Telephone Encounter (Signed)
5/147/22 [place] ORIF right ankle fx  requesting refill on Oxycodone last refill was 11/17/20 # 20 please advise.

## 2020-11-28 NOTE — Telephone Encounter (Signed)
Called patient to inform and answer her question

## 2020-11-28 NOTE — Telephone Encounter (Signed)
Yes it is fine

## 2020-11-28 NOTE — Telephone Encounter (Signed)
Pt called requesting a refill of oxycodone.  Please send to pharmacy on file. Pt phone number is [phone].

## 2020-11-28 NOTE — Telephone Encounter (Signed)
Dr Marina Goodell, you are Doc of the Day.... This patient just has a question about anesthesia, she is going to have surgery on her ankle coming up soon and her EGD is for 8/2. She just wants to be sure it is safe to have anesthesia within 2 weeks of each other   Thanks

## 2020-11-28 NOTE — Telephone Encounter (Signed)
Patient called states she is broke her ankle and they are wanting her to have a procedure done. She is requesting to speak with a nurse regarding having two procedure weeks apart.

## 2020-11-29 ENCOUNTER — Encounter: Payer: 59 | Admitting: Rehabilitative and Restorative Service Providers"

## 2020-11-29 MED ORDER — OXYCODONE HCL 5 MG PO TABS: 5.0000 mg | ORAL_TABLET | Freq: Two times a day (BID) | ORAL | 0 refills | Status: DC | PRN

## 2020-12-01 ENCOUNTER — Encounter: Payer: 59 | Admitting: Rehabilitative and Restorative Service Providers"

## 2020-12-04 ENCOUNTER — Other Ambulatory Visit: Payer: Self-pay | Admitting: Physician Assistant

## 2020-12-05 ENCOUNTER — Encounter: Payer: Self-pay | Admitting: Orthopedic Surgery

## 2020-12-05 ENCOUNTER — Ambulatory Visit (INDEPENDENT_AMBULATORY_CARE_PROVIDER_SITE_OTHER): Payer: 59 | Admitting: Physician Assistant

## 2020-12-05 DIAGNOSIS — M25871 Other specified joint disorders, right ankle and foot: Secondary | ICD-10-CM

## 2020-12-05 MED ORDER — OXYCODONE HCL 5 MG PO TABS: 5.0000 mg | ORAL_TABLET | Freq: Two times a day (BID) | ORAL | 0 refills | Status: DC | PRN

## 2020-12-05 MED ORDER — PREGABALIN 75 MG PO CAPS: 75.0000 mg | ORAL_CAPSULE | Freq: Two times a day (BID) | ORAL | 0 refills | Status: DC

## 2020-12-05 NOTE — Progress Notes (Signed)
Office Visit Note   Patient: Mallory Stewart           Date of Birth: Nov 20, 1960           MRN: [id number] Visit Date: 11/24/2020              Requested by: Nadara Mustard, MD [address] [place],  [place] [address] PCP: Excell Seltzer, MD  Chief Complaint  Patient presents with   Right Ankle - Routine Post Op    09/17/20 out of state ORIF right ankle fx       HPI: Patient is a 60 year old woman who presents for persistent right ankle pain.  She is status post open reduction internal fixation on 09/17/2020 [place].  She has been undergoing physical therapy she has taken Mobic and is also taking oxycodone.  Patient complains of pain over the lateral joint line.  She currently ambulates with a cane in regular shoewear patient states she has significant pain anteriorly over the ankle.  Assessment & Plan: Visit Diagnoses:  1. Closed fracture of right ankle with routine healing, subsequent encounter   2. Impingement syndrome of right ankle     Plan: We will set her up for right ankle arthroscopy for debridement.  With the calcification of the syndesmosis patient most likely had a very robust inflammatory process which is causing impingement of the anterior joint line.  We will plan for outpatient surgery.  Follow-Up Instructions: Return if symptoms worsen or fail to improve, for Follow-up after ankle arthroscopy.Gaylord Shih Exam  Patient is alert, oriented, no adenopathy, well-dressed, normal affect, normal respiratory effort. Examination her surgical incision is well-healed there is a prominent suture knot laterally and this is removed.  Patient has pain reproduced with palpation over the anterior joint line.  She does have swelling with edema.  Radiographs show calcification of the syndesmosis with a malunion of the medial malleolus with calcification proximal to the malunion.  Patient states she has had no relief with oxycodone.  Imaging: No results found. No images are  attached to the encounter.  Labs: Lab Results  Component Value Date   HGBA1C 5.8 06/16/2020   HGBA1C 7.1 (A) 08/19/2019   HGBA1C 7.0 (H) 10/16/2018   CRP 6.9 07/09/2017   REPTSTATUS 08/09/2020 FINAL 08/08/2020   REPTSTATUS 08/13/2020 FINAL 08/08/2020   GRAMSTAIN  08/08/2020    RARE WBC PRESENT, PREDOMINANTLY MONONUCLEAR NO ORGANISMS SEEN Performed at [facility] Lab, [address] [place], [place] [address]    CULT  08/08/2020    NO GROWTH 5 DAYS Performed at [facility], [address], [place], [place] [address]      Lab Results  Component Value Date   ALBUMIN 3.3 (L) 08/24/2020   ALBUMIN 3.3 (L) 08/18/2020   ALBUMIN 2.5 (L) 08/08/2020    Lab Results  Component Value Date   MG 1.5 08/18/2020   MG 1.8 08/08/2020   MG 1.6 (L) 08/07/2020   Lab Results  Component Value Date   VD25OH 43.63 08/24/2020    No results found for: PREALBUMIN CBC EXTENDED Latest Ref Rng & Units 09/29/2020 08/18/2020 08/07/2020  WBC 4.0 - 10.5 K/uL 7.4 9.7 8.0  RBC 3.87 - 5.11 Mil/uL 3.61(L) 3.40(L) 2.89(L)  HGB 12.0 - 15.0 g/dL 11.4(L) 11.3(L) 9.9(L)  HCT 36.0 - 46.0 % 34.1(L) 33.1(L) 27.6(L)  PLT 150.0 - 400.0 K/uL 267.0 335.0 141(L)  NEUTROABS 1.4 - 7.7 K/uL 5.2 - 5.4  LYMPHSABS 0.7 - 4.0  K/uL 1.4 - 1.6     There is no height or weight on file to calculate BMI.  Orders:  No orders of the defined types were placed in this encounter.  No orders of the defined types were placed in this encounter.    Procedures: No procedures performed  Clinical Data: No additional findings.  ROS:  All other systems negative, except as noted in the HPI. Review of Systems  Objective: Vital Signs: There were no vitals taken for this visit.  Specialty Comments:  No specialty comments available.  PMFS History: Patient Active Problem List   Diagnosis Date Noted   Portal hypertension (HCC) 09/30/2020   Dilated bile duct 09/30/2020   Alcoholic cirrhosis of liver with ascites  (HCC) 09/29/2020   abdominal acites 09/17/2020   Right foot pain 09/17/2020   Laceration of lesser toe of right foot without foreign body present or damage to nail 09/17/2020   Acute right ankle pain 09/17/2020   Frequent falls 09/14/2020   Closed fracture of ankle 09/14/2020   Foot sprain, right, subsequent encounter 08/26/2020   Sprain of anterior talofibular ligament of right ankle 08/26/2020   Splenomegaly 08/09/2020   Malnutrition of moderate degree (HCC) 08/08/2020   Transaminitis 08/06/2020   Serum total bilirubin elevated 08/06/2020   Fall at home, initial encounter 08/06/2020   Generalized weakness 08/05/2020   Idiopathic peripheral neuropathy 07/13/2020   Type 2 diabetes mellitus with diabetic polyneuropathy, with long-term current use of insulin (HCC) 07/13/2020   Persistent cough for 3 weeks or longer 07/04/2020   Low HDL (under 40) 06/21/2020   Post-nasal drip 06/21/2020   Allergic rhinitis 06/21/2020   Elevated alkaline phosphatase level 06/21/2020   Duodenitis - edema ? andgioedema 10/30/2018   Chronic alcoholic pancreatitis (HCC) 09/02/2018   Diabetes mellitus with circulatory complication, HTN (HCC) 08/22/2018   Dizziness 12/26/2017   Chronic pain 05/17/2017   Osteoarthritis of spine with radiculopathy, cervical region 09/17/2016   B12 deficiency 10/21/2014   Neuropathic pain of both legs 02/11/2014   Hepatic steatosis 04/27/2013   Hyperlipidemia associated with type 2 diabetes mellitus (HCC) 11/03/2008   Hypertension associated with diabetes (HCC) 09/29/2008   PAP SMEAR, ABNORMAL 09/29/2008   Past Medical History:  Diagnosis Date   Alcohol-induced chronic pancreatitis (HCC)    B12 deficiency    Concussion    DDD (degenerative disc disease), cervical    Diabetes (HCC)    DKA, type 2 (HCC)    Gastric outlet obstruction 08/12/2018   Hypertension    Neuropathy    Pancreatic pseudocyst/cyst 05/06/2013   Seasonal allergies     Family History  Problem  Relation Age of Onset   Other Mother        tachycardia.Marland KitchenMarland Kitchen?afib   Stroke Father        after hernia suegery   Prostate cancer Father    Other Father        global transient amnesia, unclear source   Atrial fibrillation Sister    Healthy Brother    Healthy Brother    Coronary artery disease Paternal Grandmother    Heart attack Paternal Grandmother 52   Brain cancer Maternal Grandfather        ?   Cancer Paternal Grandfather        ?   Breast cancer Maternal Grandmother     Past Surgical History:  Procedure Laterality Date   BIOPSY  01/19/2019   Procedure: BIOPSY;  Surgeon: Meridee Score Netty Starring., MD;  Location: [facility] ENDOSCOPY;  Service:  Gastroenterology;;   CHOLECYSTECTOMY N/A 07/28/2016   Procedure: LAPAROSCOPIC CHOLECYSTECTOMY WITH INTRAOPERATIVE CHOLANGIOGRAM;  Surgeon: Rodman Pickle, MD;  Location: [facility] OR;  Service: General;  Laterality: N/A;   ESOPHAGOGASTRODUODENOSCOPY (EGD) WITH PROPOFOL N/A 08/13/2018   Procedure: ESOPHAGOGASTRODUODENOSCOPY (EGD) WITH PROPOFOL;  Surgeon: Lemar Lofty., MD;  Location: [facility] ENDOSCOPY;  Service: Gastroenterology;  Laterality: N/A;   ESOPHAGOGASTRODUODENOSCOPY (EGD) WITH PROPOFOL N/A 01/19/2019   Procedure: ESOPHAGOGASTRODUODENOSCOPY (EGD) WITH PROPOFOL;  Surgeon: Meridee Score Netty Starring., MD;  Location: [facility] ENDOSCOPY;  Service: Gastroenterology;  Laterality: N/A;   IR PARACENTESIS  08/08/2020   LEEP  1990's   ORIF FOOT FRACTURE  09/2008   L 5th metatarsal   REFRACTIVE SURGERY  2000   TONSILLECTOMY     UPPER ESOPHAGEAL ENDOSCOPIC ULTRASOUND (EUS) N/A 08/13/2018   Procedure: UPPER ESOPHAGEAL ENDOSCOPIC ULTRASOUND (EUS);  Surgeon: Lemar Lofty., MD;  Location: [facility] ENDOSCOPY;  Service: Gastroenterology;  Laterality: N/A;   UPPER ESOPHAGEAL ENDOSCOPIC ULTRASOUND (EUS) N/A 01/19/2019   Procedure: UPPER ESOPHAGEAL ENDOSCOPIC ULTRASOUND (EUS);  Surgeon: Lemar Lofty., MD;  Location: [facility] ENDOSCOPY;  Service: Gastroenterology;   Laterality: N/A;   Social History   Occupational History   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

## 2020-12-05 NOTE — Progress Notes (Signed)
Office Visit Note   Patient: Mallory Stewart           Date of Birth: 1960-08-05           MRN: [id number] Visit Date: 12/05/2020              Requested by: Excell Seltzer, MD [address] [place],  [place] [address] PCP: Excell Seltzer, MD  Chief Complaint  Patient presents with   Right Ankle - Pain      HPI: Patient is a pleasant 60 year old woman who is scheduled for a right ankle arthroscopy in 2 weeks.  She is status post open reduction internal fixation right ankle.  She returns today because she has had 2 bumps on the lateral incision.  She thinks this may be secondary to suture abscess as she has had them before and Dr. Lajoyce Corners pulled 1 out of her ankle at her last visit.  She is also wondering about pain management.  She is having stabbing pain at night.  It is getting worse and now she is having pain during the day.  She also feels like there is a heavy saddle across her ankle.  She has used gabapentin in the past but has not been very effective    Assessment & Plan: Visit Diagnoses: No diagnosis found.  Plan: 1 suture abscess was removed today.  I will try her on a course of Lyrica hopefully this can help and I really need her oxycodone which she takes sparingly follow-up for her surgery or sooner if she has no relief  Follow-Up Instructions: No follow-ups on file.   Ortho Exam  Patient is alert, oriented, no adenopathy, well-dressed, normal affect, normal respiratory effort. Examination of her right ankle demonstrates a prominent Vicryl dissolving suture.  There is no cellulitis no signs of infection.  She does have some slight beginnings of another suture abscess just distal to this.  I did remove the more proximal 1.  She has no signs of infection.  Pulses are palpable  Imaging: No results found. No images are attached to the encounter.  Labs: Lab Results  Component Value Date   HGBA1C 5.8 06/16/2020   HGBA1C 7.1 (A) 08/19/2019   HGBA1C 7.0 (H)  10/16/2018   CRP 6.9 07/09/2017   REPTSTATUS 08/09/2020 FINAL 08/08/2020   REPTSTATUS 08/13/2020 FINAL 08/08/2020   GRAMSTAIN  08/08/2020    RARE WBC PRESENT, PREDOMINANTLY MONONUCLEAR NO ORGANISMS SEEN Performed at [facility], [address] [place], [place] [address]    CULT  08/08/2020    NO GROWTH 5 DAYS Performed at [facility], [address], [place], [place] [address]      Lab Results  Component Value Date   ALBUMIN 3.3 (L) 08/24/2020   ALBUMIN 3.3 (L) 08/18/2020   ALBUMIN 2.5 (L) 08/08/2020    Lab Results  Component Value Date   MG 1.5 08/18/2020   MG 1.8 08/08/2020   MG 1.6 (L) 08/07/2020   Lab Results  Component Value Date   VD25OH 43.63 08/24/2020    No results found for: PREALBUMIN CBC EXTENDED Latest Ref Rng & Units 09/29/2020 08/18/2020 08/07/2020  WBC 4.0 - 10.5 K/uL 7.4 9.7 8.0  RBC 3.87 - 5.11 Mil/uL 3.61(L) 3.40(L) 2.89(L)  HGB 12.0 - 15.0 g/dL 11.4(L) 11.3(L) 9.9(L)  HCT 36.0 - 46.0 % 34.1(L) 33.1(L) 27.6(L)  PLT 150.0 - 400.0 K/uL 267.0 335.0 141(L)  NEUTROABS 1.4 - 7.7 K/uL 5.2 - 5.4  LYMPHSABS 0.7 - 4.0 K/uL 1.4 - 1.6     There is no height or weight on file to calculate BMI.  Orders:  No orders of the defined types were placed in this encounter.  Meds ordered this encounter  Medications   pregabalin (LYRICA) 75 MG capsule    Sig: Take 1 capsule (75 mg total) by mouth 2 (two) times daily.    Dispense:  30 capsule    Refill:  0   oxyCODONE (OXY IR/ROXICODONE) 5 MG immediate release tablet    Sig: Take 1 tablet (5 mg total) by mouth every 12 (twelve) hours as needed.    Dispense:  14 tablet    Refill:  0     Procedures: No procedures performed  Clinical Data: No additional findings.  ROS:  All other systems negative, except as noted in the HPI. Review of Systems  Objective: Vital Signs: There were no vitals taken for this visit.  Specialty Comments:  No specialty comments available.  PMFS  History: Patient Active Problem List   Diagnosis Date Noted   Portal hypertension (HCC) 09/30/2020   Dilated bile duct 09/30/2020   Alcoholic cirrhosis of liver with ascites (HCC) 09/29/2020   abdominal acites 09/17/2020   Right foot pain 09/17/2020   Laceration of lesser toe of right foot without foreign body present or damage to nail 09/17/2020   Acute right ankle pain 09/17/2020   Frequent falls 09/14/2020   Closed fracture of ankle 09/14/2020   Foot sprain, right, subsequent encounter 08/26/2020   Sprain of anterior talofibular ligament of right ankle 08/26/2020   Splenomegaly 08/09/2020   Malnutrition of moderate degree (HCC) 08/08/2020   Transaminitis 08/06/2020   Serum total bilirubin elevated 08/06/2020   Fall at home, initial encounter 08/06/2020   Generalized weakness 08/05/2020   Idiopathic peripheral neuropathy 07/13/2020   Type 2 diabetes mellitus with diabetic polyneuropathy, with long-term current use of insulin (HCC) 07/13/2020   Persistent cough for 3 weeks or longer 07/04/2020   Low HDL (under 40) 06/21/2020   Post-nasal drip 06/21/2020   Allergic rhinitis 06/21/2020   Elevated alkaline phosphatase level 06/21/2020   Duodenitis - edema ? andgioedema 10/30/2018   Chronic alcoholic pancreatitis (HCC) 09/02/2018   Diabetes mellitus with circulatory complication, HTN (HCC) 08/22/2018   Dizziness 12/26/2017   Chronic pain 05/17/2017   Osteoarthritis of spine with radiculopathy, cervical region 09/17/2016   B12 deficiency 10/21/2014   Neuropathic pain of both legs 02/11/2014   Hepatic steatosis 04/27/2013   Hyperlipidemia associated with type 2 diabetes mellitus (HCC) 11/03/2008   Hypertension associated with diabetes (HCC) 09/29/2008   PAP SMEAR, ABNORMAL 09/29/2008   Past Medical History:  Diagnosis Date   Alcohol-induced chronic pancreatitis (HCC)    B12 deficiency    Concussion    DDD (degenerative disc disease), cervical    Diabetes (HCC)    DKA, type 2  (HCC)    Gastric outlet obstruction 08/12/2018   Hypertension    Neuropathy    Pancreatic pseudocyst/cyst 05/06/2013   Seasonal allergies     Family History  Problem Relation Age of Onset   Other Mother        tachycardia.Marland KitchenMarland Kitchen?afib   Stroke Father        after hernia suegery   Prostate cancer Father    Other Father        global transient amnesia, unclear source   Atrial fibrillation Sister    Healthy Brother    Healthy Brother    Coronary  artery disease Paternal Grandmother    Heart attack Paternal Grandmother 16   Brain cancer Maternal Grandfather        ?   Cancer Paternal Grandfather        ?   Breast cancer Maternal Grandmother     Past Surgical History:  Procedure Laterality Date   BIOPSY  01/19/2019   Procedure: BIOPSY;  Surgeon: Meridee Score Netty Starring., MD;  Location: [facility] ENDOSCOPY;  Service: Gastroenterology;;   CHOLECYSTECTOMY N/A 07/28/2016   Procedure: LAPAROSCOPIC CHOLECYSTECTOMY WITH INTRAOPERATIVE CHOLANGIOGRAM;  Surgeon: Rodman Pickle, MD;  Location: [facility] OR;  Service: General;  Laterality: N/A;   ESOPHAGOGASTRODUODENOSCOPY (EGD) WITH PROPOFOL N/A 08/13/2018   Procedure: ESOPHAGOGASTRODUODENOSCOPY (EGD) WITH PROPOFOL;  Surgeon: Lemar Lofty., MD;  Location: [facility] ENDOSCOPY;  Service: Gastroenterology;  Laterality: N/A;   ESOPHAGOGASTRODUODENOSCOPY (EGD) WITH PROPOFOL N/A 01/19/2019   Procedure: ESOPHAGOGASTRODUODENOSCOPY (EGD) WITH PROPOFOL;  Surgeon: Meridee Score Netty Starring., MD;  Location: [facility] ENDOSCOPY;  Service: Gastroenterology;  Laterality: N/A;   IR PARACENTESIS  08/08/2020   LEEP  1990's   ORIF FOOT FRACTURE  09/2008   L 5th metatarsal   REFRACTIVE SURGERY  2000   TONSILLECTOMY     UPPER ESOPHAGEAL ENDOSCOPIC ULTRASOUND (EUS) N/A 08/13/2018   Procedure: UPPER ESOPHAGEAL ENDOSCOPIC ULTRASOUND (EUS);  Surgeon: Lemar Lofty., MD;  Location: [facility]COPY;  Service: Gastroenterology;  Laterality: N/A;   UPPER ESOPHAGEAL ENDOSCOPIC ULTRASOUND (EUS)  N/A 01/19/2019   Procedure: UPPER ESOPHAGEAL ENDOSCOPIC ULTRASOUND (EUS);  Surgeon: Lemar Lofty., MD;  Location: [facility] ENDOSCOPY;  Service: Gastroenterology;  Laterality: N/A;   Social History   Occupational History   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

## 2020-12-06 ENCOUNTER — Encounter: Payer: Self-pay | Admitting: Internal Medicine

## 2020-12-06 ENCOUNTER — Other Ambulatory Visit: Payer: Self-pay

## 2020-12-06 ENCOUNTER — Other Ambulatory Visit (INDEPENDENT_AMBULATORY_CARE_PROVIDER_SITE_OTHER): Payer: 59

## 2020-12-06 ENCOUNTER — Ambulatory Visit (AMBULATORY_SURGERY_CENTER): Payer: 59 | Admitting: Internal Medicine

## 2020-12-06 VITALS — BP 95/67 | HR 80 | Temp 98.0°F | Resp 13 | Ht 69.0 in | Wt 175.0 lb

## 2020-12-06 DIAGNOSIS — K860 Alcohol-induced chronic pancreatitis: Secondary | ICD-10-CM

## 2020-12-06 DIAGNOSIS — K766 Portal hypertension: Secondary | ICD-10-CM

## 2020-12-06 DIAGNOSIS — K319 Disease of stomach and duodenum, unspecified: Secondary | ICD-10-CM | POA: Diagnosis not present

## 2020-12-06 DIAGNOSIS — K7031 Alcoholic cirrhosis of liver with ascites: Secondary | ICD-10-CM | POA: Diagnosis present

## 2020-12-06 DIAGNOSIS — I864 Gastric varices: Secondary | ICD-10-CM | POA: Diagnosis not present

## 2020-12-06 LAB — CBC
HCT: 35.6 % — ABNORMAL LOW (ref 36.0–46.0)
Hemoglobin: 11.7 g/dL — ABNORMAL LOW (ref 12.0–15.0)
MCHC: 32.8 g/dL (ref 30.0–36.0)
MCV: 87.2 fl (ref 78.0–100.0)
Platelets: 205 10*3/uL (ref 150.0–400.0)
RBC: 4.09 Mil/uL (ref 3.87–5.11)
RDW: 14.3 % (ref 11.5–15.5)
WBC: 5.3 10*3/uL (ref 4.0–10.5)

## 2020-12-06 LAB — COMPREHENSIVE METABOLIC PANEL
ALT: 11 U/L (ref 0–35)
AST: 29 U/L (ref 0–37)
Albumin: 3.3 g/dL — ABNORMAL LOW (ref 3.5–5.2)
Alkaline Phosphatase: 168 U/L — ABNORMAL HIGH (ref 39–117)
BUN: 12 mg/dL (ref 6–23)
CO2: 25 mEq/L (ref 19–32)
Calcium: 9 mg/dL (ref 8.4–10.5)
Chloride: 100 mEq/L (ref 96–112)
Creatinine, Ser: 0.96 mg/dL (ref 0.40–1.20)
GFR: 65.51 mL/min (ref >60.00)
Glucose, Bld: 162 mg/dL — ABNORMAL HIGH (ref 70–99)
Potassium: 3.9 mEq/L (ref 3.5–5.1)
Sodium: 135 mEq/L (ref 135–145)
Total Bilirubin: 1.2 mg/dL (ref 0.2–1.2)
Total Protein: 6.9 g/dL (ref 6.0–8.3)

## 2020-12-06 LAB — PROTIME-INR
INR: 1.5 ratio — ABNORMAL HIGH (ref 0.8–1.0)
Prothrombin Time: 15.7 s — ABNORMAL HIGH (ref 9.6–13.1)

## 2020-12-06 LAB — LIPASE: Lipase: <3 U/L — ABNORMAL LOW (ref 11.0–59.0)

## 2020-12-06 MED ORDER — SODIUM CHLORIDE 0.9 % IV SOLN: 500.0000 mL | Freq: Once | INTRAVENOUS | Status: DC

## 2020-12-06 NOTE — Op Note (Signed)
[facility] Patient Name: Mallory Stewart Procedure Date: 12/06/2020 11:12 AM MRN: [id number] Endoscopist: Iva Boop , MD Age: 60 Referring MD:  Date of Birth: [birth year] Gender: Female Account #: [id number] Procedure:                Upper GI endoscopy Indications:              Cirrhosis rule out esophageal varices Medicines:                Propofol per Anesthesia, Monitored Anesthesia Care Procedure:                Pre-Anesthesia Assessment:                           - Prior to the procedure, a History and Physical                            was performed, and patient medications and                            allergies were reviewed. The patient's tolerance of                            previous anesthesia was also reviewed. The risks                            and benefits of the procedure and the sedation                            options and risks were discussed with the patient.                            All questions were answered, and informed consent                            was obtained. Prior Anticoagulants: The patient has                            taken no previous anticoagulant or antiplatelet                            agents. ASA Grade Assessment: III - A patient with                            severe systemic disease. After reviewing the risks                            and benefits, the patient was deemed in                            satisfactory condition to undergo the procedure.                           After obtaining informed consent, the endoscope was  passed under direct vision. Throughout the                            procedure, the patient's blood pressure, pulse, and                            oxygen saturations were monitored continuously. The                            GIF [id number] #[id number] was introduced through the                            mouth, and advanced to the second part of duodenum.                             The upper GI endoscopy was accomplished without                            difficulty. The patient tolerated the procedure                            well. Scope In: Scope Out: Findings:                 The examined esophagus was normal.                           Type 1 isolated gastric varices (IGV1, varices                            located in the fundus) with no bleeding were found                            in the cardia. There were no stigmata of recent                            bleeding.                           Mild portal hypertensive gastropathy was found in                            the cardia and in the gastric fundus.                           The exam was otherwise without abnormality.                           The cardia and gastric fundus were normal on                            retroflexion. Complications:            No immediate complications. Estimated Blood Loss:     Estimated blood loss: none. Impression:               -  Normal esophagus.                           - Type 1 isolated gastric varices (IGV1, varices                            located in the fundus), without bleeding.                           - Portal hypertensive gastropathy.                           - The examination was otherwise normal.                           - No specimens collected. Recommendation:           - Patient has a contact number available for                            emergencies. The signs and symptoms of potential                            delayed complications were discussed with the                            patient. Return to normal activities tomorrow.                            Written discharge instructions were provided to the                            patient.                           - Repeat upper endoscopy in 1 year for screening                            purposes.                           - Labs today - CBC, CMET, INR and lipase Iva Boop,  MD 12/06/2020 11:38:32 AM This report has been signed electronically.

## 2020-12-06 NOTE — Progress Notes (Signed)
Medical history reviewed with no changes noted. VS assessed by C.W 

## 2020-12-06 NOTE — Patient Instructions (Addendum)
There are some swollen veins in the stomach called gastric varices. They can rupture and bleed which can be a big problem but do not recommend anything different at this point. If you have bleeding we can then determine a plan to treat.If you vomit blood or pass large amounts of blood in the stool get to a hospital immediately - would call 911.  Going to do labs today and will contact with results and plans.  I appreciate the opportunity to care for you. Iva Boop, MD, [facility]   Repeat upper endoscopy in 1 year for screening purposes.  YOU HAD AN ENDOSCOPIC PROCEDURE TODAY AT THE [facility]:   Refer to the procedure report that was given to you for any specific questions about what was found during the examination.  If the procedure report does not answer your questions, please call your gastroenterologist to clarify.  If you requested that your care partner not be given the details of your procedure findings, then the procedure report has been included in a sealed envelope for you to review at your convenience later.  YOU SHOULD EXPECT: Some feelings of bloating in the abdomen. Passage of more gas than usual.  Walking can help get rid of the air that was put into your GI tract during the procedure and reduce the bloating. If you had a lower endoscopy (such as a colonoscopy or flexible sigmoidoscopy) you may notice spotting of blood in your stool or on the toilet paper. If you underwent a bowel prep for your procedure, you may not have a normal bowel movement for a few days.  Please Note:  You might notice some irritation and congestion in your nose or some drainage.  This is from the oxygen used during your procedure.  There is no need for concern and it should clear up in a day or so.  SYMPTOMS TO REPORT IMMEDIATELY:   Following upper endoscopy (EGD)  Vomiting of blood or coffee ground material  New chest pain or pain under the shoulder blades  Painful or persistently  difficult swallowing  New shortness of breath  Fever of 100F or higher  Black, tarry-looking stools  For urgent or emergent issues, a gastroenterologist can be reached at any hour by calling [phone]. Do not use MyChart messaging for urgent concerns.    DIET:  We do recommend a small meal at first, but then you may proceed to your regular diet.  Drink plenty of fluids but you should avoid alcoholic beverages for 24 hours.  ACTIVITY:  You should plan to take it easy for the rest of today and you should NOT DRIVE or use heavy machinery until tomorrow (because of the sedation medicines used during the test).    FOLLOW UP: Our staff will call the number listed on your records 48-72 hours following your procedure to check on you and address any questions or concerns that you may have regarding the information given to you following your procedure. If we do not reach you, we will leave a message.  We will attempt to reach you two times.  During this call, we will ask if you have developed any symptoms of COVID 19. If you develop any symptoms (ie: fever, flu-like symptoms, shortness of breath, cough etc.) before then, please call [phone].  If you test positive for Covid 19 in the 2 weeks post procedure, please call and report this information to [place].    If any biopsies were taken you will be  contacted by phone or by letter within the next 1-3 weeks.  Please call us at [phone] if you have not heard about the biopsies in 3 weeks.    SIGNATURES/CONFIDENTIALITY: You and/or your care partner have signed paperwork which will be entered into your electronic medical record.  These signatures attest to the fact that that the information above on your After Visit Summary has been reviewed and is understood.  Full responsibility of the confidentiality of this discharge information lies with you and/or your care-partner.

## 2020-12-06 NOTE — Progress Notes (Signed)
Report to PACU, RN, vss, BBS= Clear.  

## 2020-12-08 ENCOUNTER — Telehealth: Payer: Self-pay

## 2020-12-08 NOTE — Telephone Encounter (Signed)
  Follow up Call-  Call back number 12/06/2020  Post procedure Call Back phone  # [phone]  Permission to leave phone message Yes  Some recent data might be hidden     Patient questions:  Do you have a fever, pain , or abdominal swelling? No. Pain Score  0 *  Have you tolerated food without any problems? Yes.    Have you been able to return to your normal activities? Yes.    Do you have any questions about your discharge instructions: Diet   No. Medications  No. Follow up visit  No.  Do you have questions or concerns about your Care? No.  Actions: * If pain score is 4 or above: No action needed, pain <4.  Have you developed a fever since your procedure? no  2.   Have you had an respiratory symptoms (SOB or cough) since your procedure? no  3.   Have you tested positive for COVID 19 since your procedure no  4.   Have you had any family members/close contacts diagnosed with the COVID 19 since your procedure?  no   If yes to any of these questions please route to Laverna Peace, RN and Karlton Lemon, RN

## 2020-12-13 ENCOUNTER — Other Ambulatory Visit: Payer: Self-pay

## 2020-12-13 ENCOUNTER — Encounter (HOSPITAL_BASED_OUTPATIENT_CLINIC_OR_DEPARTMENT_OTHER): Payer: Self-pay | Admitting: Orthopedic Surgery

## 2020-12-15 ENCOUNTER — Ambulatory Visit: Payer: 59 | Admitting: Allergy

## 2020-12-15 DIAGNOSIS — J309 Allergic rhinitis, unspecified: Secondary | ICD-10-CM

## 2020-12-20 ENCOUNTER — Ambulatory Visit (HOSPITAL_BASED_OUTPATIENT_CLINIC_OR_DEPARTMENT_OTHER): Payer: 59 | Admitting: Certified Registered"

## 2020-12-20 ENCOUNTER — Other Ambulatory Visit: Payer: Self-pay

## 2020-12-20 ENCOUNTER — Encounter (HOSPITAL_BASED_OUTPATIENT_CLINIC_OR_DEPARTMENT_OTHER)
Admission: RE | Disposition: A | Discharge status: Home / Self Care | Payer: Self-pay | Source: Home / Self Care | Attending: Orthopedic Surgery

## 2020-12-20 ENCOUNTER — Ambulatory Visit (HOSPITAL_BASED_OUTPATIENT_CLINIC_OR_DEPARTMENT_OTHER)
Admission: RE | Admit: 2020-12-20 | Discharge: 2020-12-20 | Disposition: A | Discharge status: Home / Self Care | Payer: 59 | Attending: Orthopedic Surgery | Admitting: Orthopedic Surgery

## 2020-12-20 ENCOUNTER — Encounter (HOSPITAL_BASED_OUTPATIENT_CLINIC_OR_DEPARTMENT_OTHER): Payer: Self-pay | Admitting: Orthopedic Surgery

## 2020-12-20 DIAGNOSIS — S82891D Other fracture of right lower leg, subsequent encounter for closed fracture with routine healing: Secondary | ICD-10-CM | POA: Diagnosis not present

## 2020-12-20 DIAGNOSIS — M25871 Other specified joint disorders, right ankle and foot: Secondary | ICD-10-CM

## 2020-12-20 DIAGNOSIS — Z8249 Family history of ischemic heart disease and other diseases of the circulatory system: Secondary | ICD-10-CM | POA: Diagnosis not present

## 2020-12-20 DIAGNOSIS — X58XXXD Exposure to other specified factors, subsequent encounter: Secondary | ICD-10-CM | POA: Diagnosis not present

## 2020-12-20 DIAGNOSIS — Z9049 Acquired absence of other specified parts of digestive tract: Secondary | ICD-10-CM | POA: Diagnosis not present

## 2020-12-20 DIAGNOSIS — E1110 Type 2 diabetes mellitus with ketoacidosis without coma: Secondary | ICD-10-CM | POA: Diagnosis not present

## 2020-12-20 DIAGNOSIS — Z888 Allergy status to other drugs, medicaments and biological substances status: Secondary | ICD-10-CM | POA: Insufficient documentation

## 2020-12-20 DIAGNOSIS — I10 Essential (primary) hypertension: Secondary | ICD-10-CM | POA: Insufficient documentation

## 2020-12-20 DIAGNOSIS — E1140 Type 2 diabetes mellitus with diabetic neuropathy, unspecified: Secondary | ICD-10-CM | POA: Insufficient documentation

## 2020-12-20 HISTORY — PX: ANKLE ARTHROSCOPY: SHX545

## 2020-12-20 LAB — GLUCOSE, CAPILLARY
Glucose-Capillary: 281 mg/dL — ABNORMAL HIGH (ref 70–99)
Glucose-Capillary: 277 mg/dL — ABNORMAL HIGH (ref 70–99)

## 2020-12-20 SURGERY — ARTHROSCOPY, ANKLE
Anesthesia: General | Site: Ankle | Laterality: Right

## 2020-12-20 MED ORDER — MIDAZOLAM HCL 2 MG/2ML IJ SOLN
2.0000 mg | Freq: Once | INTRAMUSCULAR | Status: AC
  Administered 2020-12-20: 2 mg via INTRAVENOUS

## 2020-12-20 MED ORDER — PROMETHAZINE HCL 25 MG/ML IJ SOLN: 6.2500 mg | INTRAMUSCULAR | Status: DC | PRN

## 2020-12-20 MED ORDER — ONDANSETRON HCL 4 MG/2ML IJ SOLN
INTRAMUSCULAR | Status: DC | PRN
  Administered 2020-12-20: 4 mg via INTRAVENOUS

## 2020-12-20 MED ORDER — MEPERIDINE HCL 25 MG/ML IJ SOLN: 6.2500 mg | INTRAMUSCULAR | Status: DC | PRN

## 2020-12-20 MED ORDER — PHENYLEPHRINE 40 MCG/ML (10ML) SYRINGE FOR IV PUSH (FOR BLOOD PRESSURE SUPPORT)
PREFILLED_SYRINGE | INTRAVENOUS | Status: DC | PRN
  Administered 2020-12-20: 80 ug via INTRAVENOUS
  Administered 2020-12-20: 120 ug via INTRAVENOUS
  Administered 2020-12-20 (×4): 80 ug via INTRAVENOUS

## 2020-12-20 MED ORDER — PROPOFOL 10 MG/ML IV BOLUS
INTRAVENOUS | Status: DC | PRN
  Administered 2020-12-20: 140 mg via INTRAVENOUS

## 2020-12-20 MED ORDER — HYDROMORPHONE HCL 1 MG/ML IJ SOLN: 0.2500 mg | INTRAMUSCULAR | Status: DC | PRN

## 2020-12-20 MED ORDER — LACTATED RINGERS IV SOLN: INTRAVENOUS | Status: DC

## 2020-12-20 MED ORDER — DEXAMETHASONE SODIUM PHOSPHATE 10 MG/ML IJ SOLN
INTRAMUSCULAR | Status: DC | PRN
  Administered 2020-12-20: 10 mg via INTRAVENOUS

## 2020-12-20 MED ORDER — ROPIVACAINE HCL 5 MG/ML IJ SOLN
INTRAMUSCULAR | Status: DC | PRN
  Administered 2020-12-20: 30 mL via PERINEURAL

## 2020-12-20 MED ORDER — DEXAMETHASONE SODIUM PHOSPHATE 10 MG/ML IJ SOLN
INTRAMUSCULAR | Status: AC
  Filled 2020-12-20: qty 1

## 2020-12-20 MED ORDER — OXYCODONE HCL 5 MG PO TABS: 5.0000 mg | ORAL_TABLET | Freq: Once | ORAL | Status: DC | PRN

## 2020-12-20 MED ORDER — FENTANYL CITRATE (PF) 100 MCG/2ML IJ SOLN
INTRAMUSCULAR | Status: AC
  Filled 2020-12-20: qty 2

## 2020-12-20 MED ORDER — LIDOCAINE HCL (PF) 1 % IJ SOLN
INTRAMUSCULAR | Status: AC
  Filled 2020-12-20: qty 30

## 2020-12-20 MED ORDER — OXYCODONE-ACETAMINOPHEN 5-325 MG PO TABS: 1.0000 | ORAL_TABLET | ORAL | 0 refills | Status: DC | PRN

## 2020-12-20 MED ORDER — MIDAZOLAM HCL 2 MG/2ML IJ SOLN
INTRAMUSCULAR | Status: AC
  Filled 2020-12-20: qty 2

## 2020-12-20 MED ORDER — AMISULPRIDE (ANTIEMETIC) 5 MG/2ML IV SOLN
10.0000 mg | Freq: Once | INTRAVENOUS | Status: DC | PRN
Start: 2020-12-20 — End: 2020-12-20

## 2020-12-20 MED ORDER — OXYCODONE HCL 5 MG/5ML PO SOLN: 5.0000 mg | Freq: Once | ORAL | Status: DC | PRN

## 2020-12-20 MED ORDER — CEFAZOLIN SODIUM-DEXTROSE 2-4 GM/100ML-% IV SOLN
2.0000 g | INTRAVENOUS | Status: AC
  Administered 2020-12-20: 2 g via INTRAVENOUS

## 2020-12-20 MED ORDER — BUPIVACAINE HCL (PF) 0.25 % IJ SOLN
INTRAMUSCULAR | Status: AC
  Filled 2020-12-20: qty 90

## 2020-12-20 MED ORDER — LIDOCAINE 2% (20 MG/ML) 5 ML SYRINGE
INTRAMUSCULAR | Status: DC | PRN
  Administered 2020-12-20: 60 mg via INTRAVENOUS

## 2020-12-20 MED ORDER — SODIUM CHLORIDE 0.9 % IR SOLN
Status: DC | PRN
  Administered 2020-12-20: 6000 mL

## 2020-12-20 MED ORDER — FENTANYL CITRATE (PF) 100 MCG/2ML IJ SOLN
100.0000 ug | Freq: Once | INTRAMUSCULAR | Status: AC
  Administered 2020-12-20: 100 ug via INTRAVENOUS

## 2020-12-20 MED ORDER — ONDANSETRON HCL 4 MG/2ML IJ SOLN
INTRAMUSCULAR | Status: AC
  Filled 2020-12-20: qty 2

## 2020-12-20 MED ORDER — CEFAZOLIN SODIUM-DEXTROSE 2-4 GM/100ML-% IV SOLN
INTRAVENOUS | Status: AC
  Filled 2020-12-20: qty 100

## 2020-12-20 MED ORDER — PROPOFOL 10 MG/ML IV BOLUS
INTRAVENOUS | Status: AC
  Filled 2020-12-20: qty 20

## 2020-12-20 MED ORDER — LIDOCAINE HCL (PF) 2 % IJ SOLN
INTRAMUSCULAR | Status: AC
  Filled 2020-12-20: qty 5

## 2020-12-20 SURGICAL SUPPLY — 30 items
BNDG COHESIVE 4X5 TAN ST LF (GAUZE/BANDAGES/DRESSINGS) IMPLANT
BNDG COHESIVE 6X5 TAN ST LF (GAUZE/BANDAGES/DRESSINGS) ×2 IMPLANT
BNDG ESMARK 4X9 LF (GAUZE/BANDAGES/DRESSINGS) IMPLANT
BNDG GAUZE ELAST 4 BULKY (GAUZE/BANDAGES/DRESSINGS) IMPLANT
DISSECTOR 4.0MM X 13CM (MISCELLANEOUS) ×2 IMPLANT
DRAPE ARTHROSCOPY W/POUCH 90 (DRAPES) ×2 IMPLANT
DRAPE OEC MINIVIEW 54X84 (DRAPES) IMPLANT
DRAPE U-SHAPE 47X51 STRL (DRAPES) ×2 IMPLANT
DRSG EMULSION OIL 3X3 NADH (GAUZE/BANDAGES/DRESSINGS) ×2 IMPLANT
DURAPREP 26ML APPLICATOR (WOUND CARE) ×2 IMPLANT
EXCALIBUR 3.8MM X 13CM (MISCELLANEOUS) ×2 IMPLANT
GAUZE SPONGE 4X4 12PLY STRL (GAUZE/BANDAGES/DRESSINGS) ×2 IMPLANT
GLOVE SURG ORTHO LTX SZ9 (GLOVE) ×2 IMPLANT
GLOVE SURG UNDER POLY LF SZ9 (GLOVE) ×2 IMPLANT
GOWN STRL REUS W/ TWL LRG LVL3 (GOWN DISPOSABLE) ×1 IMPLANT
GOWN STRL REUS W/ TWL XL LVL3 (GOWN DISPOSABLE) ×1 IMPLANT
GOWN STRL REUS W/TWL LRG LVL3 (GOWN DISPOSABLE) ×1
GOWN STRL REUS W/TWL XL LVL3 (GOWN DISPOSABLE) ×1
MANIFOLD NEPTUNE II (INSTRUMENTS) IMPLANT
PACK ARTHROSCOPY DSU (CUSTOM PROCEDURE TRAY) ×2 IMPLANT
PACK BASIN DAY SURGERY FS (CUSTOM PROCEDURE TRAY) ×2 IMPLANT
PORT APPOLLO RF 90DEGREE MULTI (SURGICAL WAND) IMPLANT
PROBE APOLLO 90XL (SURGICAL WAND) ×4 IMPLANT
SLEEVE SCD COMPRESS KNEE MED (STOCKING) ×2 IMPLANT
STRAP ANKLE FOOT DISTRACTOR (ORTHOPEDIC SUPPLIES) ×2 IMPLANT
SUT ETHILON 2 0 FSLX (SUTURE) ×2 IMPLANT
SYR 5ML LL (SYRINGE) IMPLANT
TOWEL GREEN STERILE FF (TOWEL DISPOSABLE) ×2 IMPLANT
TUBING ARTHROSCOPY IRRIG 16FT (MISCELLANEOUS) ×2 IMPLANT
WATER STERILE IRR 1000ML POUR (IV SOLUTION) ×2 IMPLANT

## 2020-12-20 NOTE — Anesthesia Procedure Notes (Signed)
Procedure Name: LMA Insertion Date/Time: 12/20/2020 7:48 AM Performed by: Lucinda Dell, CRNA Pre-anesthesia Checklist: Emergency Drugs available, Patient identified, Suction available and Patient being monitored Patient Re-evaluated:Patient Re-evaluated prior to induction Oxygen Delivery Method: Circle system utilized Preoxygenation: Pre-oxygenation with 100% oxygen Induction Type: IV induction Ventilation: Mask ventilation without difficulty LMA: LMA inserted LMA Size: 4.0 Number of attempts: 1 Placement Confirmation: positive ETCO2 and breath sounds checked- equal and bilateral Tube secured with: Tape Dental Injury: Teeth and Oropharynx as per pre-operative assessment

## 2020-12-20 NOTE — Anesthesia Procedure Notes (Signed)
Anesthesia Regional Block: Popliteal block   Pre-Anesthetic Checklist: , timeout performed,  Correct Patient, Correct Site, Correct Laterality,  Correct Procedure, Correct Position, site marked,  Risks and benefits discussed,  Surgical consent,  Pre-op evaluation,  At surgeon's request and post-op pain management  Laterality: Right  Prep: chloraprep       Needles:  Injection technique: Single-shot  Needle Type: Stimiplex     Needle Length: 9cm  Needle Gauge: 21     Additional Needles:   Procedures:,,,, ultrasound used (permanent image in chart),,    Narrative:  Start time: 12/20/2020 7:16 AM End time: 12/20/2020 7:21 AM Injection made incrementally with aspirations every 5 mL.  Performed by: Personally  Anesthesiologist: Lowella Curb, MD

## 2020-12-20 NOTE — Anesthesia Preprocedure Evaluation (Signed)
Anesthesia Evaluation  Patient identified by MRN, date of birth, ID band Patient awake    Reviewed: Allergy & Precautions, NPO status , Patient's Chart, lab work & pertinent test results, reviewed documented beta blocker date and time   History of Anesthesia Complications Negative for: history of anesthetic complications  Airway Mallampati: II  TM Distance: >3 FB Neck ROM: Full    Dental  (+) Dental Advisory Given   Pulmonary neg pulmonary ROS,    Pulmonary exam normal        Cardiovascular hypertension, Pt. on medications and Pt. on home beta blockers Normal cardiovascular exam   '20 TTE - Normal EF, moderately dilated LA    Neuro/Psych  Vertigo Neuropathy  negative psych ROS   GI/Hepatic (+)     substance abuse  alcohol use,  Chronic pancreatitis    Endo/Other  diabetes, Type 2, Insulin Dependent, Oral Hypoglycemic Agents  Renal/GU negative Renal ROS     Musculoskeletal  (+) Arthritis , Osteoarthritis,    Abdominal   Peds  Hematology negative hematology ROS (+)   Anesthesia Other Findings   Reproductive/Obstetrics                             Anesthesia Physical  Anesthesia Plan  ASA: III  Anesthesia Plan: General   Post-op Pain Management:  Regional for Post-op pain   Induction: Intravenous  PONV Risk Score and Plan: 3 and Treatment may vary due to age or medical condition, Ondansetron, Dexamethasone and Midazolam  Airway Management Planned: LMA  Additional Equipment: None  Intra-op Plan:   Post-operative Plan: Extubation in OR  Informed Consent: I have reviewed the patients History and Physical, chart, labs and discussed the procedure including the risks, benefits and alternatives for the proposed anesthesia with the patient or authorized representative who has indicated his/her understanding and acceptance.       Plan Discussed with: CRNA and  Anesthesiologist  Anesthesia Plan Comments:         Anesthesia Quick Evaluation

## 2020-12-20 NOTE — H&P (Addendum)
Mallory Stewart is an 60 y.o. female.   Chief Complaint: Right Ankle Impingement HPI: Patient is a 60 year old woman who presents for persistent right ankle pain.  She is status post open [place] internal [place]  She has been undergoing physical therapy she has taken Mobic and is also taking oxycodone.  Patient complains of pain over the lateral joint line.  She currently ambulates with a cane in regular shoewear patient states she has significant pain anteriorly over the ankle.  Past Medical History:  Diagnosis Date   Alcohol-induced chronic pancreatitis (HCC)    B12 deficiency    Concussion    DDD (degenerative disc disease), cervical    Diabetes (HCC)    DKA, type 2 (HCC)    Gastric outlet obstruction 08/12/2018   Hypertension    Neuropathy    Pancreatic pseudocyst/cyst 05/06/2013   Seasonal allergies     Past Surgical History:  Procedure Laterality Date   BIOPSY  01/19/2019   Procedure: BIOPSY;  Surgeon: Lemar Lofty., MD;  Location: [facility] ENDOSCOPY;  Service: Gastroenterology;;   CHOLECYSTECTOMY N/A 07/28/2016   Procedure: LAPAROSCOPIC CHOLECYSTECTOMY WITH INTRAOPERATIVE CHOLANGIOGRAM;  Surgeon: Rodman Pickle, MD;  Location: [facility] OR;  Service: General;  Laterality: N/A;   ESOPHAGOGASTRODUODENOSCOPY (EGD) WITH PROPOFOL N/A 08/13/2018   Procedure: ESOPHAGOGASTRODUODENOSCOPY (EGD) WITH PROPOFOL;  Surgeon: Lemar Lofty., MD;  Location: [facility] ENDOSCOPY;  Service: Gastroenterology;  Laterality: N/A;   ESOPHAGOGASTRODUODENOSCOPY (EGD) WITH PROPOFOL N/A 01/19/2019   Procedure: ESOPHAGOGASTRODUODENOSCOPY (EGD) WITH PROPOFOL;  Surgeon: Meridee Score Netty Starring., MD;  Location: [facility] ENDOSCOPY;  Service: Gastroenterology;  Laterality: N/A;   IR PARACENTESIS  08/08/2020   LEEP  1990's   ORIF FOOT FRACTURE  09/2008   L 5th metatarsal   REFRACTIVE SURGERY  2000   TONSILLECTOMY     UPPER ESOPHAGEAL ENDOSCOPIC ULTRASOUND (EUS) N/A 08/13/2018   Procedure:  UPPER ESOPHAGEAL ENDOSCOPIC ULTRASOUND (EUS);  Surgeon: Lemar Lofty., MD;  Location: [facility] ENDOSCOPY;  Service: Gastroenterology;  Laterality: N/A;   UPPER ESOPHAGEAL ENDOSCOPIC ULTRASOUND (EUS) N/A 01/19/2019   Procedure: UPPER ESOPHAGEAL ENDOSCOPIC ULTRASOUND (EUS);  Surgeon: Lemar Lofty., MD;  Location: [facility] ENDOSCOPY;  Service: Gastroenterology;  Laterality: N/A;    Family History  Problem Relation Age of Onset   Other Mother        tachycardia.Marland KitchenMarland Kitchen?afib   Stroke Father        after hernia suegery   Prostate cancer Father    Other Father        global transient amnesia, unclear source   Atrial fibrillation Sister    Healthy Brother    Healthy Brother    Coronary artery disease Paternal Grandmother    Heart attack Paternal Grandmother 6   Brain cancer Maternal Grandfather        ?   Cancer Paternal Grandfather        ?   Breast cancer Maternal Grandmother    Social History:  reports that she has never smoked. She has never used smokeless tobacco. She reports that she does not currently use alcohol after a past usage of about 7.0 standard drinks per week. She reports that she does not use drugs.  Allergies:  Allergies  Allergen Reactions   Gluten Meal Other (See Comments)    Stomach distress   Zocor [Simvastatin] Other (See Comments)    Elevated lfts?    No medications prior to admission.    No results found for this or any previous visit (from the past  48 hour(s)). No results found.  Review of Systems  All other systems reviewed and are negative.  Height 5\' 9"  (1.753 m), weight 79.4 kg. Physical Exam  Patient is alert, oriented, no adenopathy, well-dressed, normal affect, normal respiratory effort.  Examination her surgical incision is well-healed.  Patient has pain reproduced with palpation over the anterior joint line.  She does have swelling with edema.  Radiographs show calcification of the syndesmosis with a malunion of the medial malleolus with  calcification proximal to the malunion. Assessment/Plan  1. Closed fracture of right ankle with routine healing, subsequent encounter   2. Impingement syndrome of right ankle       Plan: We will set her up for right ankle arthroscopy for debridement.  With the calcification of the syndesmosis patient most likely had a very robust inflammatory process which is causing impingement of the anterior joint line.  We will plan for outpatient surgery.

## 2020-12-20 NOTE — Anesthesia Postprocedure Evaluation (Signed)
Anesthesia Post Note  Patient: Mallory Stewart  Procedure(s) Performed: RIGHT ANKLE ARTHROSCOPIC DEBRIDEMENT (Right: Ankle)     Patient location during evaluation: PACU Anesthesia Type: General Level of consciousness: awake and alert Pain management: pain level controlled Vital Signs Assessment: post-procedure vital signs reviewed and stable Respiratory status: spontaneous breathing, nonlabored ventilation and respiratory function stable Cardiovascular status: blood pressure returned to baseline and stable Postop Assessment: no apparent nausea or vomiting Anesthetic complications: no   No notable events documented.  Last Vitals:  Vitals:   12/20/20 0845 12/20/20 0900  BP: 97/68 110/81  Pulse: 80 78  Resp: 13 16  Temp:  36.6 C  SpO2: 93% 93%    Last Pain:  Vitals:   12/20/20 0900  TempSrc:   PainSc: 0-No pain                 Lowella Curb

## 2020-12-20 NOTE — Discharge Instructions (Signed)

## 2020-12-20 NOTE — Transfer of Care (Signed)
Immediate Anesthesia Transfer of Care Note  Patient: Mallory Stewart  Procedure(s) Performed: RIGHT ANKLE ARTHROSCOPIC DEBRIDEMENT (Right: Ankle)  Patient Location: PACU  Anesthesia Type:GA combined with regional for post-op pain  Level of Consciousness: sedated, patient cooperative and responds to stimulation  Airway & Oxygen Therapy: Patient Spontanous Breathing and Patient connected to nasal cannula oxygen  Post-op Assessment: Report given to RN and Post -op Vital signs reviewed and stable  Post vital signs: Reviewed and stable  Last Vitals:  Vitals Value Taken Time  BP 111/77 12/20/20 0823  Temp    Pulse 85 12/20/20 0825  Resp 12 12/20/20 0825  SpO2 97 % 12/20/20 0825  Vitals shown include unvalidated device data.  Last Pain:  Vitals:   12/20/20 0636  TempSrc: Oral  PainSc: 5       Patients Stated Pain Goal: 8 (12/20/20 0636)  Complications: No notable events documented.

## 2020-12-20 NOTE — Op Note (Signed)
12/20/2020  8:25 AM  PATIENT:  Mallory Stewart    PRE-OPERATIVE DIAGNOSIS:  Impingement Right Ankle  POST-OPERATIVE DIAGNOSIS:  Same  PROCEDURE:  RIGHT ANKLE ARTHROSCOPIC DEBRIDEMENT  SURGEON:  Nadara Mustard, MD  PHYSICIAN ASSISTANT:None ANESTHESIA:   General  PREOPERATIVE INDICATIONS:  Mallory Stewart is a  60 y.o. female with a diagnosis of Impingement Right Ankle who failed conservative measures and elected for surgical management.    The risks benefits and alternatives were discussed with the patient preoperatively including but not limited to the risks of infection, bleeding, nerve injury, cardiopulmonary complications, the need for revision surgery, among others, and the patient was willing to proceed.  OPERATIVE IMPLANTS: None  @ENCIMAGES @  OPERATIVE FINDINGS: Patient had extensive synovitis that was debrided from the medial and lateral gutters as well as anteriorly.  Patient had extensive cartilage loss with eburnated bone approximately 50% of the anterior joint surface and thinning of the cartilage throughout.  OPERATIVE PROCEDURE: Patient was brought the operating room and underwent general anesthetic.  After adequate levels anesthesia were obtained patient's right lower extremity was prepped using DuraPrep draped into a sterile field a timeout was called.  The scope was inserted through the anterior medial portal and anterior lateral working portal was established.  The skin was incised blunt dissection was carried down to the capsule and blunt trocar was used and inserted into the joint for both portals.  Visualization showed extensive synovitis.  The electrical wand and shaver were used to debride the synovitis from the anterior aspect as well as medial lateral gutters.  Patient also had delaminated cartilage anteriorly and this was debrided with a shaver back to subchondral bone.  Patient had extensive thinning of the articular cartilage as well as extensive cartilage  loss approximately 50% of the anterior joint surface.  The medial aspect of the tibial talar joint showed thin cartilage decreased joint space but no osteochondral defects posteriorly.  After survey of all compartments the instruments were removed the portals were closed using 2-0 nylon a sterile dressing was applied patient was extubated taken the PACU in stable condition   DISCHARGE PLANNING:  Antibiotic duration: Preoperative antibiotics  Weightbearing: Weightbearing as tolerated  Pain medication: Prescription for Percocet  Dressing care/ Wound VAC: Dry dressing change in 2 days  Ambulatory devices: Crutches  Discharge to: Home.  Follow-up: In the office 1 week post operative.

## 2020-12-20 NOTE — Progress Notes (Signed)
Assisted Dr. Miller with right, ultrasound guided, popliteal block. Side rails up, monitors on throughout procedure. See vital signs in flow sheet. Tolerated Procedure well. 

## 2020-12-22 ENCOUNTER — Encounter (HOSPITAL_BASED_OUTPATIENT_CLINIC_OR_DEPARTMENT_OTHER): Payer: Self-pay | Admitting: Orthopedic Surgery

## 2020-12-27 ENCOUNTER — Ambulatory Visit (INDEPENDENT_AMBULATORY_CARE_PROVIDER_SITE_OTHER): Payer: 59 | Admitting: Family

## 2020-12-27 ENCOUNTER — Encounter: Payer: 59 | Admitting: Rehabilitative and Restorative Service Providers"

## 2020-12-27 ENCOUNTER — Encounter: Payer: Self-pay | Admitting: Family

## 2020-12-27 DIAGNOSIS — M25871 Other specified joint disorders, right ankle and foot: Secondary | ICD-10-CM

## 2020-12-27 MED ORDER — PREGABALIN 75 MG PO CAPS: 75.0000 mg | ORAL_CAPSULE | Freq: Two times a day (BID) | ORAL | 2 refills | Status: DC

## 2020-12-27 NOTE — Progress Notes (Signed)
Post-Op Visit Note   Patient: Mallory Stewart           Date of Birth: 1960/08/29           MRN: [id number] Visit Date: 12/27/2020 PCP: Excell Seltzer, MD  Chief Complaint:  Chief Complaint  Patient presents with   Right Ankle - Follow-up    HPI:  HPI The patient is a 60 year old woman seen 1 week status post right ankle arthroscopy with debridement for ankle impingement.  She is also status post ORIF for a right ankle fracture in April of this year  She has been having significant issues with pain she has been full weightbearing since the surgery.  She also does have bilateral neuropathy she had previously been taking gabapentin and was recently placed on Lyrica which she found to be quite helpful for her neuropathy. is requesting a refill Ortho Exam On examination of the right ankle her portals are well-healed sutures are in place there is no gaping no drainage no surrounding erythema mild edema of the ankle.  No dystrophic changes.  Visit Diagnoses:  1. Impingement of right ankle joint     Plan: Sutures harvested today.  She will limit her weightbearing for the next week.  We will cancel her physical therapy for the next week.  She will resume therapy next week.  Work on passive range of motion.  Follow-up in the office in 2 weeks.  Follow-Up Instructions: Return in about 2 weeks (around 01/10/2021).   Imaging: No results found.  Orders:  No orders of the defined types were placed in this encounter.  Meds ordered this encounter  Medications   pregabalin (LYRICA) 75 MG capsule    Sig: Take 1 capsule (75 mg total) by mouth 2 (two) times daily.    Dispense:  60 capsule    Refill:  2     PMFS History: Patient Active Problem List   Diagnosis Date Noted   Impingement of right ankle joint    Gastric varices without bleeding 12/06/2020   Portal hypertension (HCC) 09/30/2020   Dilated bile duct 09/30/2020   Alcoholic cirrhosis of liver with ascites (HCC) 09/29/2020    abdominal acites 09/17/2020   Right foot pain 09/17/2020   Laceration of lesser toe of right foot without foreign body present or damage to nail 09/17/2020   Acute right ankle pain 09/17/2020   Frequent falls 09/14/2020   Closed fracture of ankle 09/14/2020   Foot sprain, right, subsequent encounter 08/26/2020   Sprain of anterior talofibular ligament of right ankle 08/26/2020   Splenomegaly 08/09/2020   Malnutrition of moderate degree (HCC) 08/08/2020   Transaminitis 08/06/2020   Serum total bilirubin elevated 08/06/2020   Fall at home, initial encounter 08/06/2020   Generalized weakness 08/05/2020   Idiopathic peripheral neuropathy 07/13/2020   Type 2 diabetes mellitus with diabetic polyneuropathy, with long-term current use of insulin (HCC) 07/13/2020   Persistent cough for 3 weeks or longer 07/04/2020   Low HDL (under 40) 06/21/2020   Post-nasal drip 06/21/2020   Allergic rhinitis 06/21/2020   Elevated alkaline phosphatase level 06/21/2020   Duodenitis - edema ? andgioedema 10/30/2018   Chronic alcoholic pancreatitis (HCC) 09/02/2018   Diabetes mellitus with circulatory complication, HTN (HCC) 08/22/2018   Dizziness 12/26/2017   Chronic pain 05/17/2017   Osteoarthritis of spine with radiculopathy, cervical region 09/17/2016   B12 deficiency 10/21/2014   Neuropathic pain of both legs 02/11/2014   Hepatic steatosis 04/27/2013   Hyperlipidemia associated with  type 2 diabetes mellitus (HCC) 11/03/2008   Hypertension associated with diabetes (HCC) 09/29/2008   PAP SMEAR, ABNORMAL 09/29/2008   Past Medical History:  Diagnosis Date   Alcohol-induced chronic pancreatitis (HCC)    B12 deficiency    Concussion    DDD (degenerative disc disease), cervical    Diabetes (HCC)    DKA, type 2 (HCC)    Gastric outlet obstruction 08/12/2018   Hypertension    Neuropathy    Pancreatic pseudocyst/cyst 05/06/2013   Seasonal allergies     Family History  Problem Relation Age of Onset    Other Mother        tachycardia.Marland KitchenMarland Kitchen?afib   Stroke Father        after hernia suegery   Prostate cancer Father    Other Father        global transient amnesia, unclear source   Atrial fibrillation Sister    Healthy Brother    Healthy Brother    Coronary artery disease Paternal Grandmother    Heart attack Paternal Grandmother 1   Brain cancer Maternal Grandfather        ?   Cancer Paternal Grandfather        ?   Breast cancer Maternal Grandmother     Past Surgical History:  Procedure Laterality Date   ANKLE ARTHROSCOPY Right 12/20/2020   Procedure: RIGHT ANKLE ARTHROSCOPIC DEBRIDEMENT;  Surgeon: Nadara Mustard, MD;  Location: [facility];  Service: Orthopedics;  Laterality: Right;   BIOPSY  01/19/2019   Procedure: BIOPSY;  Surgeon: Meridee Score Netty Starring., MD;  Location: [facility] ENDOSCOPY;  Service: Gastroenterology;;   CHOLECYSTECTOMY N/A 07/28/2016   Procedure: LAPAROSCOPIC CHOLECYSTECTOMY WITH INTRAOPERATIVE CHOLANGIOGRAM;  Surgeon: Rodman Pickle, MD;  Location: [facility] OR;  Service: General;  Laterality: N/A;   ESOPHAGOGASTRODUODENOSCOPY (EGD) WITH PROPOFOL N/A 08/13/2018   Procedure: ESOPHAGOGASTRODUODENOSCOPY (EGD) WITH PROPOFOL;  Surgeon: Lemar Lofty., MD;  Location: [facility] ENDOSCOPY;  Service: Gastroenterology;  Laterality: N/A;   ESOPHAGOGASTRODUODENOSCOPY (EGD) WITH PROPOFOL N/A 01/19/2019   Procedure: ESOPHAGOGASTRODUODENOSCOPY (EGD) WITH PROPOFOL;  Surgeon: Meridee Score Netty Starring., MD;  Location: [facility] ENDOSCOPY;  Service: Gastroenterology;  Laterality: N/A;   IR PARACENTESIS  08/08/2020   LEEP  1990's   ORIF FOOT FRACTURE  09/2008   L 5th metatarsal   REFRACTIVE SURGERY  2000   TONSILLECTOMY     UPPER ESOPHAGEAL ENDOSCOPIC ULTRASOUND (EUS) N/A 08/13/2018   Procedure: UPPER ESOPHAGEAL ENDOSCOPIC ULTRASOUND (EUS);  Surgeon: Lemar Lofty., MD;  Location: [facility] ENDOSCOPY;  Service: Gastroenterology;  Laterality: N/A;   UPPER ESOPHAGEAL ENDOSCOPIC ULTRASOUND  (EUS) N/A 01/19/2019   Procedure: UPPER ESOPHAGEAL ENDOSCOPIC ULTRASOUND (EUS);  Surgeon: Lemar Lofty., MD;  Location: [facility] ENDOSCOPY;  Service: Gastroenterology;  Laterality: N/A;   Social History   Occupational History   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

## 2020-12-28 ENCOUNTER — Other Ambulatory Visit: Payer: Self-pay | Admitting: Internal Medicine

## 2020-12-29 ENCOUNTER — Encounter: Payer: 59 | Admitting: Rehabilitative and Restorative Service Providers"

## 2021-01-03 ENCOUNTER — Encounter: Payer: 59 | Admitting: Rehabilitative and Restorative Service Providers"

## 2021-01-05 ENCOUNTER — Telehealth: Payer: Self-pay | Admitting: Pharmacy Technician

## 2021-01-05 ENCOUNTER — Encounter: Payer: 59 | Admitting: Rehabilitative and Restorative Service Providers"

## 2021-01-05 NOTE — Telephone Encounter (Addendum)
[facility] Encounter   Received notification from [employer] that prior authorization for [facility] 10MG  is required.   PA submitted on 01/06/2019 Key 03/08/2019 Status is DENIED Request Reference Number: [id number]. FARXIGA TAB 10MG  is denied for not meeting the prior authorization requirement(s)    [facility] will continue to follow   [id number], CPhT Patient [facility] Phone: [phone] Fax:  [phone]

## 2021-01-06 ENCOUNTER — Telehealth: Payer: Self-pay | Admitting: Internal Medicine

## 2021-01-06 ENCOUNTER — Telehealth: Payer: Self-pay | Admitting: Orthopedic Surgery

## 2021-01-06 ENCOUNTER — Other Ambulatory Visit: Payer: Self-pay | Admitting: Internal Medicine

## 2021-01-06 MED ORDER — EMPAGLIFLOZIN 25 MG PO TABS: 25.0000 mg | ORAL_TABLET | Freq: Every day | ORAL | 3 refills | Status: DC

## 2021-01-06 NOTE — Telephone Encounter (Signed)
Have we heard anything regarding her PA

## 2021-01-06 NOTE — Telephone Encounter (Signed)
Please let the patient know that her insurance does not cover for [place] and I switched it to [place] instead she needs to take it 1 tablet daily in the morning    Thanks

## 2021-01-06 NOTE — Telephone Encounter (Signed)
Called and notified patient of this.

## 2021-01-06 NOTE — Telephone Encounter (Signed)
Pt states that she will be out of farxiga Sunday. Pt would like to know what to do regarding this . Pt would like a call back

## 2021-01-10 ENCOUNTER — Ambulatory Visit (INDEPENDENT_AMBULATORY_CARE_PROVIDER_SITE_OTHER): Payer: 59 | Admitting: Family

## 2021-01-10 ENCOUNTER — Ambulatory Visit: Payer: 59 | Admitting: Rehabilitative and Restorative Service Providers"

## 2021-01-10 ENCOUNTER — Encounter: Payer: Self-pay | Admitting: Rehabilitative and Restorative Service Providers"

## 2021-01-10 ENCOUNTER — Ambulatory Visit (INDEPENDENT_AMBULATORY_CARE_PROVIDER_SITE_OTHER): Payer: 59

## 2021-01-10 ENCOUNTER — Encounter: Payer: Self-pay | Admitting: Family

## 2021-01-10 ENCOUNTER — Other Ambulatory Visit: Payer: Self-pay

## 2021-01-10 DIAGNOSIS — M79661 Pain in right lower leg: Secondary | ICD-10-CM | POA: Diagnosis not present

## 2021-01-10 DIAGNOSIS — M25671 Stiffness of right ankle, not elsewhere classified: Secondary | ICD-10-CM

## 2021-01-10 DIAGNOSIS — R262 Difficulty in walking, not elsewhere classified: Secondary | ICD-10-CM | POA: Diagnosis not present

## 2021-01-10 DIAGNOSIS — M25871 Other specified joint disorders, right ankle and foot: Secondary | ICD-10-CM

## 2021-01-10 DIAGNOSIS — M25571 Pain in right ankle and joints of right foot: Secondary | ICD-10-CM | POA: Diagnosis not present

## 2021-01-10 DIAGNOSIS — M6281 Muscle weakness (generalized): Secondary | ICD-10-CM

## 2021-01-10 DIAGNOSIS — R600 Localized edema: Secondary | ICD-10-CM

## 2021-01-10 MED ORDER — OXYCODONE-ACETAMINOPHEN 5-325 MG PO TABS: 1.0000 | ORAL_TABLET | Freq: Three times a day (TID) | ORAL | 0 refills | Status: DC | PRN

## 2021-01-10 NOTE — Therapy (Signed)
[facility] [address] [place], [place], [address] Phone: [phone]   Fax:  [phone]  Physical Therapy Re-Evaluation/ Treatment  Patient Details  Name: Mallory Stewart MRN: [id number] Date of Birth: [birth year] Referring Provider (PT): Dr. Aldean Baker   Encounter Date: 01/10/2021  Progress Note Reporting Period 11/24/2020 to 01/10/2021  See note below for Objective Data and Assessment of Progress/Goals.       PT End of Session - 01/10/21 1013     Visit Number 10    Number of Visits 18    Date for PT Re-Evaluation 03/07/21    Authorization Type [employer] 20 PT visits per year    Authorization - Visit Number 10    Authorization - Number of Visits 18    Progress Note Due on Visit 18    PT Start Time 1014    PT Stop Time 1100    PT Time Calculation (min) 46 min    Activity Tolerance Patient tolerated treatment well    Behavior During Therapy WFL for tasks assessed/performed             Past Medical History:  Diagnosis Date   Alcohol-induced chronic pancreatitis (HCC)    B12 deficiency    Concussion    DDD (degenerative disc disease), cervical    Diabetes (HCC)    DKA, type 2 (HCC)    Gastric outlet obstruction 08/12/2018   Hypertension    Neuropathy    Pancreatic pseudocyst/cyst 05/06/2013   Seasonal allergies     Past Surgical History:  Procedure Laterality Date   ANKLE ARTHROSCOPY Right 12/20/2020   Procedure: RIGHT ANKLE ARTHROSCOPIC DEBRIDEMENT;  Surgeon: Nadara Mustard, MD;  Location: [facility];  Service: Orthopedics;  Laterality: Right;   BIOPSY  01/19/2019   Procedure: BIOPSY;  Surgeon: Meridee Score Netty Starring., MD;  Location: [facility] ENDOSCOPY;  Service: Gastroenterology;;   CHOLECYSTECTOMY N/A 07/28/2016   Procedure: LAPAROSCOPIC CHOLECYSTECTOMY WITH INTRAOPERATIVE CHOLANGIOGRAM;  Surgeon: Rodman Pickle, MD;  Location: [facility] OR;  Service: General;  Laterality: N/A;   ESOPHAGOGASTRODUODENOSCOPY (EGD)  WITH PROPOFOL N/A 08/13/2018   Procedure: ESOPHAGOGASTRODUODENOSCOPY (EGD) WITH PROPOFOL;  Surgeon: Lemar Lofty., MD;  Location: [facility] ENDOSCOPY;  Service: Gastroenterology;  Laterality: N/A;   ESOPHAGOGASTRODUODENOSCOPY (EGD) WITH PROPOFOL N/A 01/19/2019   Procedure: ESOPHAGOGASTRODUODENOSCOPY (EGD) WITH PROPOFOL;  Surgeon: Meridee Score Netty Starring., MD;  Location: [facility] ENDOSCOPY;  Service: Gastroenterology;  Laterality: N/A;   IR PARACENTESIS  08/08/2020   LEEP  1990's   ORIF FOOT FRACTURE  09/2008   L 5th metatarsal   REFRACTIVE SURGERY  2000   TONSILLECTOMY     UPPER ESOPHAGEAL ENDOSCOPIC ULTRASOUND (EUS) N/A 08/13/2018   Procedure: UPPER ESOPHAGEAL ENDOSCOPIC ULTRASOUND (EUS);  Surgeon: Lemar Lofty., MD;  Location: [facility] ENDOSCOPY;  Service: Gastroenterology;  Laterality: N/A;   UPPER ESOPHAGEAL ENDOSCOPIC ULTRASOUND (EUS) N/A 01/19/2019   Procedure: UPPER ESOPHAGEAL ENDOSCOPIC ULTRASOUND (EUS);  Surgeon: Lemar Lofty., MD;  Location: [facility] ENDOSCOPY;  Service: Gastroenterology;  Laterality: N/A;    There were no vitals filed for this visit.   Subjective Assessment - 01/10/21 1017     Subjective Pt. indicated continued "a lot of pain", noted in anterior shin/ankle.  Pt. stated pain increased with WB pressure.  Pt. stated no boot wearing after most recent surgery.  Debridgement surgery on 12/20/2020.  xrays today performed on shin and unremarkable.  Using FWW at this time.  Was in wheel chair for several weeks after surgery.  Wants to start back  cane use.    Limitations Standing;Walking;House hold activities    Patient Stated Goals Reduce pain, walking independent/swimming, dog walks    Currently in Pain? Yes    Pain Score 3    10/10 at worst   Pain Location Leg    Pain Orientation Right   anterior shin, ankle   Pain Type Chronic pain    Pain Onset More than a month ago    Pain Frequency Constant    Aggravating Factors  walking, standing pressure (improved some c movement)     Pain Relieving Factors resting                [facility] PT Assessment - 01/10/21 0001       Assessment   Medical Diagnosis Rt ankle ORIF s/p trimalleolar fracture, debridement 12/20/2020    Referring Provider (PT) Dr. Aldean Baker    Onset Date/Surgical Date 09/10/20   12/20/2020 2nd surgery     Restrictions   Other Position/Activity Restrictions WBAT      Balance Screen   Has the patient fallen in the past 6 months No    Has the patient had a decrease in activity level because of a fear of falling?  No    Is the patient reluctant to leave their home because of a fear of falling?  No      Observation/Other Assessments   Focus on Therapeutic Outcomes (FOTO)  update 49% (initial 50 %), predicted 72%      Observation/Other Assessments-Edema    Edema Figure 8   Rt 20.25 inch,  20 inch Lt     AROM   Overall AROM Comments pain noted in Rt ankle    Right Ankle Dorsiflexion -10   in long sitting knee ext at 0 degree   Right Ankle Plantar Flexion 40    Right Ankle Inversion 18    Right Ankle Eversion 13      PROM   Right Ankle Dorsiflexion -8    Right Ankle Plantar Flexion 40    Right Ankle Inversion 20    Right Ankle Eversion 15      Strength   Right Ankle Dorsiflexion 4/5    Right Ankle Plantar Flexion 2+/5    Right Ankle Inversion 4/5    Right Ankle Eversion 4/5      Ambulation/Gait   Gait Comments FWW ambulation, minimal toe off progression on Rt ankle, decreased stance, reduced step length c Lt leg as a result                           [facility] Adult PT Treatment/Exercise - 01/10/21 0001       Vasopneumatic   Number Minutes Vasopneumatic  10 minutes    Vasopnuematic Location  Ankle   Rt   Vasopneumatic Pressure Medium    Vasopneumatic Temperature  34      Manual Therapy   Manual therapy comments ap talocrural joint mobs g3-g4 Rt ankle, PROM      Ankle Exercises: Stretches   Other Stretch strap stretch knee extension at 0 deg PF 30 sec x 3       Ankle Exercises: Seated   Other Seated Ankle Exercises green band 4 way ankle 20 x each direction Rt      Ankle Exercises: Standing   Other Standing Ankle Exercises DF on step Rt x 15  PT Education - 01/10/21 1046     Education Details HEP review    Person(s) Educated Patient    Methods Explanation;Demonstration;Verbal cues;Handout    Comprehension Verbalized understanding;Returned demonstration              PT Short Term Goals - 01/10/21 1014       PT SHORT TERM GOAL #1   Title Patient will demonstrate independent use of home exercise program to maintain progress from in clinic treatments.    Time 3    Period Weeks    Status Revised    Target Date 01/31/21               PT Long Term Goals - 01/10/21 1014       PT LONG TERM GOAL #1   Title Patient will demonstrate/report pain at worst less than or equal to 2/10 to facilitate minimal limitation in daily activity secondary to pain symptoms.    Time 8    Period Weeks    Status Revised    Target Date 03/07/21      PT LONG TERM GOAL #2   Title Patient will demonstrate independent use of home exercise program to facilitate ability to maintain/progress functional gains from skilled physical therapy services.    Time 8    Period Weeks    Status Revised    Target Date 03/07/21      PT LONG TERM GOAL #3   Title Pt. will demonstrate FOTO outcome > or = 72 to indicated reduced disability due to condition.    Time 8    Period Weeks    Status Revised    Target Date 03/07/21      PT LONG TERM GOAL #4   Title Pt. will demonstrate independent community ambulation > 300 ft s deviation to facilitate usual daily walking at PLOF.    Time 10    Period Weeks    Status Revised    Target Date 03/07/21      PT LONG TERM GOAL #5   Title Pt. will demonstrate Rt ankle df > = 5 degrees, inversion/eversion > 25 degrees to facilitate usual mobility for daily walking, standing, transfers at PLOF.     Time 8    Period Weeks    Status Revised    Target Date 03/07/21      PT LONG TERM GOAL #6   Title Pt. will demonstrate Rt ankle MMT 5/5 throughout to facilitate usual daily and recreational activity at PLOF.    Time 8    Period Weeks    Status Revised    Target Date 03/07/21      PT LONG TERM GOAL #7   Title Pt. will demonstrate single leg stance bilateral > 10 seconds to facilitate improved stability in ambulation.    Time 8    Period Weeks    Status Revised    Target Date 03/07/21                   Plan - 01/10/21 1047     Clinical Impression Statement Pt. returned today s/p recent 2nd surgery on Rt ankle.  Patient is a 60 y.o. who comes to clinic with complaints of Rt ankle/leg pain with mobility, strength and movement coordination deficits that impair their ability to perform usual daily and recreational functional activities without increase difficulty/symptoms at this time.  Patient to benefit from skilled PT services to address impairments and limitations to improve to previous level of function  without restriction secondary to condition.    Personal Factors and Comorbidities Comorbidity 3+    Comorbidities DDD cervical, HTN, extensive vestibular complication history    Examination-Activity Limitations Bend;Squat;Stairs;Carry;Stand;Transfers;Dressing;Locomotion Level    Examination-Participation Restrictions Cleaning;Community Activity;Shop;Driving;Yard Work;Laundry;Occupation    Stability/Clinical Decision Making Evolving/Moderate complexity    Clinical Decision Making Moderate    Rehab Potential Good    PT Frequency 2x / week    PT Duration 8 weeks    PT Treatment/Interventions ADLs/Self Care Home Management;Cryotherapy;Electrical Stimulation;Iontophoresis 4mg /ml Dexamethasone;Moist Heat;Balance training;Therapeutic exercise;Therapeutic activities;Functional mobility training;Stair training;Gait training;DME Instruction;Ultrasound;Neuromuscular  re-education;Patient/family education;Passive range of motion;Taping;Vasopneumatic Device;Manual techniques;Canalith Repostioning;Vestibular    PT Next Visit Plan Progress Rt ankle mobility, progressive strengthening, SPC use    PT Home Exercise Plan [email or web address]    Consulted and Agree with Plan of Care Patient             Patient will benefit from skilled therapeutic intervention in order to improve the following deficits and impairments:  Abnormal gait, Decreased endurance, Hypomobility, Increased edema, Decreased activity tolerance, Decreased strength, Pain, Decreased balance, Decreased mobility, Difficulty walking, Impaired perceived functional ability, Improper body mechanics, Impaired flexibility, Decreased coordination, Decreased range of motion, Dizziness  Visit Diagnosis: Pain in right ankle and joints of right foot  Muscle weakness (generalized)  Stiffness of ankle joint, right  Difficulty in walking, not elsewhere classified  Localized edema     Problem List Patient Active Problem List   Diagnosis Date Noted   Impingement of right ankle joint    Gastric varices without bleeding 12/06/2020   Portal hypertension (HCC) 09/30/2020   Dilated bile duct 09/30/2020   Alcoholic cirrhosis of liver with ascites (HCC) 09/29/2020   abdominal acites 09/17/2020   Right foot pain 09/17/2020   Laceration of lesser toe of right foot without foreign body present or damage to nail 09/17/2020   Acute right ankle pain 09/17/2020   Frequent falls 09/14/2020   Closed fracture of ankle 09/14/2020   Foot sprain, right, subsequent encounter 08/26/2020   Sprain of anterior talofibular ligament of right ankle 08/26/2020   Splenomegaly 08/09/2020   Malnutrition of moderate degree (HCC) 08/08/2020   Transaminitis 08/06/2020   Serum total bilirubin elevated 08/06/2020   Fall at home, initial encounter 08/06/2020   Generalized weakness 08/05/2020   Idiopathic peripheral neuropathy 07/13/2020    Type 2 diabetes mellitus with diabetic polyneuropathy, with long-term current use of insulin (HCC) 07/13/2020   Persistent cough for 3 weeks or longer 07/04/2020   Low HDL (under 40) 06/21/2020   Post-nasal drip 06/21/2020   Allergic rhinitis 06/21/2020   Elevated alkaline phosphatase level 06/21/2020   Duodenitis - edema ? andgioedema 10/30/2018   Chronic alcoholic pancreatitis (HCC) 09/02/2018   Diabetes mellitus with circulatory complication, HTN (HCC) 08/22/2018   Dizziness 12/26/2017   Chronic pain 05/17/2017   Osteoarthritis of spine with radiculopathy, cervical region 09/17/2016   B12 deficiency 10/21/2014   Neuropathic pain of both legs 02/11/2014   Hepatic steatosis 04/27/2013   Hyperlipidemia associated with type 2 diabetes mellitus (HCC) 11/03/2008   Hypertension associated with diabetes (HCC) 09/29/2008   PAP SMEAR, ABNORMAL 09/29/2008    10/01/2008, PT, DPT, OCS, ATC 01/10/21  10:51 AM   [facility] [address] [place], [place], [place] Phone: [phone]   Fax:  [phone]  Name: Mallory Stewart MRN: [name] Date of Birth: [birth year]

## 2021-01-10 NOTE — Progress Notes (Signed)
Post-Op Visit Note   Patient: Mallory Stewart           Date of Birth: 05/29/1960           MRN: [id number] Visit Date: 01/10/2021 PCP: Excell Seltzer, MD  Chief Complaint: No chief complaint on file.   HPI:  HPI Is a 60 year old woman seen 4 weeks out from a right ankle arthroscopy.  She is status post ORIF right ankle fracture earlier this summer.  She has been holding off on her weightbearing holding off on physical therapy.  She will be resuming physical therapy today.  She is quite frustrated with her continued issues with pain.  She is having pain over her anterior shin as well as over the lateral compartment.  She does have some aching in the ankle however this is not her primary concern she does wear her compression garments not sure whether these are relieving her pain does assist with her swelling today she is ambulating with a walker  Ortho Exam On examination of the right lower extremity there is pitting edema.  There is no erythema no weeping.  She does have tenderness over the proximal shin.  There is no tenderness over the anterior ankle joint.  Painless range of motion and drawer testing.  Visit Diagnoses: No diagnosis found. . Plan: Reassurance provided.  Discussed natural course of healing.  They will continue with the compression garments elevating for swelling.  Proceed with physical therapy.  Have refilled her pain medication.  We will check in with them in about 4 weeks time  Follow-Up Instructions: No follow-ups on file.   Imaging: No results found.  Orders:  No orders of the defined types were placed in this encounter.  No orders of the defined types were placed in this encounter.    PMFS History: Patient Active Problem List   Diagnosis Date Noted   Impingement of right ankle joint    Gastric varices without bleeding 12/06/2020   Portal hypertension (HCC) 09/30/2020   Dilated bile duct 09/30/2020   Alcoholic cirrhosis of liver with ascites (HCC)  09/29/2020   abdominal acites 09/17/2020   Right foot pain 09/17/2020   Laceration of lesser toe of right foot without foreign body present or damage to nail 09/17/2020   Acute right ankle pain 09/17/2020   Frequent falls 09/14/2020   Closed fracture of ankle 09/14/2020   Foot sprain, right, subsequent encounter 08/26/2020   Sprain of anterior talofibular ligament of right ankle 08/26/2020   Splenomegaly 08/09/2020   Malnutrition of moderate degree (HCC) 08/08/2020   Transaminitis 08/06/2020   Serum total bilirubin elevated 08/06/2020   Fall at home, initial encounter 08/06/2020   Generalized weakness 08/05/2020   Idiopathic peripheral neuropathy 07/13/2020   Type 2 diabetes mellitus with diabetic polyneuropathy, with long-term current use of insulin (HCC) 07/13/2020   Persistent cough for 3 weeks or longer 07/04/2020   Low HDL (under 40) 06/21/2020   Post-nasal drip 06/21/2020   Allergic rhinitis 06/21/2020   Elevated alkaline phosphatase level 06/21/2020   Duodenitis - edema ? andgioedema 10/30/2018   Chronic alcoholic pancreatitis (HCC) 09/02/2018   Diabetes mellitus with circulatory complication, HTN (HCC) 08/22/2018   Dizziness 12/26/2017   Chronic pain 05/17/2017   Osteoarthritis of spine with radiculopathy, cervical region 09/17/2016   B12 deficiency 10/21/2014   Neuropathic pain of both legs 02/11/2014   Hepatic steatosis 04/27/2013   Hyperlipidemia associated with type 2 diabetes mellitus (HCC) 11/03/2008   Hypertension associated with  diabetes (HCC) 09/29/2008   PAP SMEAR, ABNORMAL 09/29/2008   Past Medical History:  Diagnosis Date   Alcohol-induced chronic pancreatitis (HCC)    B12 deficiency    Concussion    DDD (degenerative disc disease), cervical    Diabetes (HCC)    DKA, type 2 (HCC)    Gastric outlet obstruction 08/12/2018   Hypertension    Neuropathy    Pancreatic pseudocyst/cyst 05/06/2013   Seasonal allergies     Family History  Problem Relation Age  of Onset   Other Mother        tachycardia.Marland KitchenMarland Kitchen?afib   Stroke Father        after hernia suegery   Prostate cancer Father    Other Father        global transient amnesia, unclear source   Atrial fibrillation Sister    Healthy Brother    Healthy Brother    Coronary artery disease Paternal Grandmother    Heart attack Paternal Grandmother 92   Brain cancer Maternal Grandfather        ?   Cancer Paternal Grandfather        ?   Breast cancer Maternal Grandmother     Past Surgical History:  Procedure Laterality Date   ANKLE ARTHROSCOPY Right 12/20/2020   Procedure: RIGHT ANKLE ARTHROSCOPIC DEBRIDEMENT;  Surgeon: Nadara Mustard, MD;  Location: [facility];  Service: Orthopedics;  Laterality: Right;   BIOPSY  01/19/2019   Procedure: BIOPSY;  Surgeon: Meridee Score Netty Starring., MD;  Location: [facility] ENDOSCOPY;  Service: Gastroenterology;;   CHOLECYSTECTOMY N/A 07/28/2016   Procedure: LAPAROSCOPIC CHOLECYSTECTOMY WITH INTRAOPERATIVE CHOLANGIOGRAM;  Surgeon: Rodman Pickle, MD;  Location: [facility] OR;  Service: General;  Laterality: N/A;   ESOPHAGOGASTRODUODENOSCOPY (EGD) WITH PROPOFOL N/A 08/13/2018   Procedure: ESOPHAGOGASTRODUODENOSCOPY (EGD) WITH PROPOFOL;  Surgeon: Lemar Lofty., MD;  Location: [facility] ENDOSCOPY;  Service: Gastroenterology;  Laterality: N/A;   ESOPHAGOGASTRODUODENOSCOPY (EGD) WITH PROPOFOL N/A 01/19/2019   Procedure: ESOPHAGOGASTRODUODENOSCOPY (EGD) WITH PROPOFOL;  Surgeon: Meridee Score Netty Starring., MD;  Location: [facility] ENDOSCOPY;  Service: Gastroenterology;  Laterality: N/A;   IR PARACENTESIS  08/08/2020   LEEP  1990's   ORIF FOOT FRACTURE  09/2008   L 5th metatarsal   REFRACTIVE SURGERY  2000   TONSILLECTOMY     UPPER ESOPHAGEAL ENDOSCOPIC ULTRASOUND (EUS) N/A 08/13/2018   Procedure: UPPER ESOPHAGEAL ENDOSCOPIC ULTRASOUND (EUS);  Surgeon: Lemar Lofty., MD;  Location: [facility] ENDOSCOPY;  Service: Gastroenterology;  Laterality: N/A;   UPPER ESOPHAGEAL ENDOSCOPIC  ULTRASOUND (EUS) N/A 01/19/2019   Procedure: UPPER ESOPHAGEAL ENDOSCOPIC ULTRASOUND (EUS);  Surgeon: Lemar Lofty., MD;  Location: [facility] ENDOSCOPY;  Service: Gastroenterology;  Laterality: N/A;   Social History   Occupational History   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

## 2021-01-10 NOTE — Patient Instructions (Signed)
Access Code: [email or web address] URL: [email or web address] Date: 01/10/2021 Prepared by: Chyrel Masson  Exercises Ankle Pumps in Elevation - 2 x daily - 7 x weekly - 3 sets - 10 reps Supine Ankle Inversion Eversion AROM - 2 x daily - 7 x weekly - 3 sets - 10 reps Ankle Alphabet in Elevation - 2 x daily - 7 x weekly - 3 sets - 10 reps Seated Calf Stretch with Strap - 2 x daily - 7 x weekly - 1 sets - 5 reps - 15-20 hold Long Sitting Ankle Eversion with Resistance - 2 x daily - 7 x weekly - 10 reps - 3 sets Long Sitting Ankle Plantar Flexion with Resistance - 2 x daily - 7 x weekly - 10 reps - 3 sets Long Sitting Ankle Inversion with Resistance - 2 x daily - 7 x weekly - 10 reps - 3 sets Long Sitting Ankle Dorsiflexion with Anchored Resistance - 2 x daily - 7 x weekly - 10 reps - 3 sets Standing Dorsiflexion Self-Mobilization on Step - 2 x daily - 7 x weekly - 10 reps - 3 sets - 2-3 hold

## 2021-01-11 ENCOUNTER — Other Ambulatory Visit: Payer: Self-pay

## 2021-01-11 ENCOUNTER — Telehealth: Payer: Self-pay | Admitting: Pharmacy Technician

## 2021-01-11 NOTE — Telephone Encounter (Addendum)
Patient Advocate Encounter   Received notification from [facility] that prior authorization for Manson Allan is required.   PA submitted on 01/11/2021 Key [id number] Status is APPROVED  Request Reference Number: [id number]. JARDIANCE TAB 25MG  is approved through 01/11/2022.    [facility] will continue to follow   03/13/2022, CPhT Patient Advocate [facility] Phone: [phone] Fax:  [phone]

## 2021-01-12 ENCOUNTER — Ambulatory Visit: Payer: 59 | Admitting: Rehabilitative and Restorative Service Providers"

## 2021-01-12 ENCOUNTER — Encounter: Payer: Self-pay | Admitting: Rehabilitative and Restorative Service Providers"

## 2021-01-12 ENCOUNTER — Telehealth: Payer: Self-pay | Admitting: Internal Medicine

## 2021-01-12 ENCOUNTER — Other Ambulatory Visit: Payer: Self-pay

## 2021-01-12 DIAGNOSIS — M25571 Pain in right ankle and joints of right foot: Secondary | ICD-10-CM

## 2021-01-12 DIAGNOSIS — M25671 Stiffness of right ankle, not elsewhere classified: Secondary | ICD-10-CM

## 2021-01-12 DIAGNOSIS — R262 Difficulty in walking, not elsewhere classified: Secondary | ICD-10-CM

## 2021-01-12 DIAGNOSIS — K7031 Alcoholic cirrhosis of liver with ascites: Secondary | ICD-10-CM

## 2021-01-12 DIAGNOSIS — M6281 Muscle weakness (generalized): Secondary | ICD-10-CM | POA: Diagnosis not present

## 2021-01-12 DIAGNOSIS — R600 Localized edema: Secondary | ICD-10-CM

## 2021-01-12 NOTE — Telephone Encounter (Signed)
Inbound call from patient. States stomach is swollen and hard to touch. Says she may be filling with fluid and need it drained. Best contact [phone]

## 2021-01-12 NOTE — Therapy (Signed)
[facility] Physical Therapy [address] [place], [place], [address] Phone: [phone]   Fax:  [phone]  Physical Therapy Treatment  Patient Details  Name: Mallory Stewart MRN: [id number] Date of Birth: 12-Dec-1960 Referring Provider (PT): Dr. Aldean Baker   Encounter Date: 01/12/2021   PT End of Session - 01/12/21 1016     Visit Number 11    Number of Visits 18    Date for PT Re-Evaluation 03/07/21    Authorization Type U[employer] 20 PT visits per year    Authorization - Visit Number 11    Authorization - Number of Visits 18    Progress Note Due on Visit 18    PT Start Time 1014    PT Stop Time 1105    PT Time Calculation (min) 51 min    Activity Tolerance Patient tolerated treatment well    Behavior During Therapy [facility] for tasks assessed/performed             Past Medical History:  Diagnosis Date   Alcohol-induced chronic pancreatitis (HCC)    B12 deficiency    Concussion    DDD (degenerative disc disease), cervical    Diabetes (HCC)    DKA, type 2 (HCC)    Gastric outlet obstruction 08/12/2018   Hypertension    Neuropathy    Pancreatic pseudocyst/cyst 05/06/2013   Seasonal allergies     Past Surgical History:  Procedure Laterality Date   ANKLE ARTHROSCOPY Right 12/20/2020   Procedure: RIGHT ANKLE ARTHROSCOPIC DEBRIDEMENT;  Surgeon: Nadara Mustard, MD;  Location: [facility];  Service: Orthopedics;  Laterality: Right;   BIOPSY  01/19/2019   Procedure: BIOPSY;  Surgeon: Meridee Score Netty Starring., MD;  Location: [facility] ENDOSCOPY;  Service: Gastroenterology;;   CHOLECYSTECTOMY N/A 07/28/2016   Procedure: LAPAROSCOPIC CHOLECYSTECTOMY WITH INTRAOPERATIVE CHOLANGIOGRAM;  Surgeon: Rodman Pickle, MD;  Location: [facility] OR;  Service: General;  Laterality: N/A;   ESOPHAGOGASTRODUODENOSCOPY (EGD) WITH PROPOFOL N/A 08/13/2018   Procedure: ESOPHAGOGASTRODUODENOSCOPY (EGD) WITH PROPOFOL;  Surgeon: Lemar Lofty., MD;  Location: [facility] ENDOSCOPY;   Service: Gastroenterology;  Laterality: N/A;   ESOPHAGOGASTRODUODENOSCOPY (EGD) WITH PROPOFOL N/A 01/19/2019   Procedure: ESOPHAGOGASTRODUODENOSCOPY (EGD) WITH PROPOFOL;  Surgeon: Meridee Score Netty Starring., MD;  Location: [facility] ENDOSCOPY;  Service: Gastroenterology;  Laterality: N/A;   IR PARACENTESIS  08/08/2020   LEEP  1990's   ORIF FOOT FRACTURE  09/2008   L 5th metatarsal   REFRACTIVE SURGERY  2000   TONSILLECTOMY     UPPER ESOPHAGEAL ENDOSCOPIC ULTRASOUND (EUS) N/A 08/13/2018   Procedure: UPPER ESOPHAGEAL ENDOSCOPIC ULTRASOUND (EUS);  Surgeon: Lemar Lofty., MD;  Location: [facility] ENDOSCOPY;  Service: Gastroenterology;  Laterality: N/A;   UPPER ESOPHAGEAL ENDOSCOPIC ULTRASOUND (EUS) N/A 01/19/2019   Procedure: UPPER ESOPHAGEAL ENDOSCOPIC ULTRASOUND (EUS);  Surgeon: Lemar Lofty., MD;  Location: [facility] ENDOSCOPY;  Service: Gastroenterology;  Laterality: N/A;    There were no vitals filed for this visit.   Subjective Assessment - 01/12/21 1016     Subjective Pt. reported using cane and walking a bit better.  Pt. indicated pain is there but nothing severe.    Limitations Standing;Walking;House hold activities    Patient Stated Goals Reduce pain, walking independent/swimming, dog walks    Currently in Pain? Yes    Pain Score 2     Pain Location Leg    Pain Orientation Right    Pain Descriptors / Indicators Tightness;Sore;Tender;Sharp    Pain Type Chronic pain    Pain Onset More than a month  ago    Pain Frequency Intermittent    Aggravating Factors  comes and goes with pressure sometimes    Pain Relieving Factors rest                [facility] PT Assessment - 01/12/21 0001       Ambulation/Gait   Gait Comments SPC use in ambulation today                           [facility] Adult PT Treatment/Exercise - 01/12/21 0001       Neuro Re-ed    Neuro Re-ed Details  tandem stance 1 min x 2 bilateral occasional hand assist on bar      Vasopneumatic   Number Minutes  Vasopneumatic  10 minutes    Vasopnuematic Location  Ankle   Rt   Vasopneumatic Pressure Medium    Vasopneumatic Temperature  34      Manual Therapy   Manual therapy comments DF mobilization c movement c posterior talocrural glide in lunge positioning on setp x 15      Ankle Exercises: Aerobic   Nustep Lvl 5 6 mins for ankle mobility      Ankle Exercises: Stretches   Gastroc Stretch 30 seconds;3 reps   incilne board stretch knee straight   Other Stretch DF on step x15      Ankle Exercises: Standing   Heel Raises Both;20 reps    Other Standing Ankle Exercises step up 6 inch x 15 Rt c one hand assist      Ankle Exercises: Machines for Strengthening   Cybex Leg Press Double leg 75 lbs 2 x15. 37 lbs Rt 2 x 15      Ankle Exercises: Seated   Other Seated Ankle Exercises green band 4 way ankle 2 x 15 each direction Rt                       PT Short Term Goals - 01/12/21 1035       PT SHORT TERM GOAL #1   Title Patient will demonstrate independent use of home exercise program to maintain progress from in clinic treatments.    Time 3    Period Weeks    Status On-going    Target Date 01/31/21               PT Long Term Goals - 01/10/21 1014       PT LONG TERM GOAL #1   Title Patient will demonstrate/report pain at worst less than or equal to 2/10 to facilitate minimal limitation in daily activity secondary to pain symptoms.    Time 8    Period Weeks    Status Revised    Target Date 03/07/21      PT LONG TERM GOAL #2   Title Patient will demonstrate independent use of home exercise program to facilitate ability to maintain/progress functional gains from skilled physical therapy services.    Time 8    Period Weeks    Status Revised    Target Date 03/07/21      PT LONG TERM GOAL #3   Title Pt. will demonstrate FOTO outcome > or = 72 to indicated reduced disability due to condition.    Time 8    Period Weeks    Status Revised    Target Date 03/07/21       PT LONG TERM GOAL #4   Title Pt. will demonstrate  independent community ambulation > 300 ft s deviation to facilitate usual daily walking at PLOF.    Time 10    Period Weeks    Status Revised    Target Date 03/07/21      PT LONG TERM GOAL #5   Title Pt. will demonstrate Rt ankle df > = 5 degrees, inversion/eversion > 25 degrees to facilitate usual mobility for daily walking, standing, transfers at PLOF.    Time 8    Period Weeks    Status Revised    Target Date 03/07/21      PT LONG TERM GOAL #6   Title Pt. will demonstrate Rt ankle MMT 5/5 throughout to facilitate usual daily and recreational activity at PLOF.    Time 8    Period Weeks    Status Revised    Target Date 03/07/21      PT LONG TERM GOAL #7   Title Pt. will demonstrate single leg stance bilateral > 10 seconds to facilitate improved stability in ambulation.    Time 8    Period Weeks    Status Revised    Target Date 03/07/21                   Plan - 01/12/21 1033     Clinical Impression Statement Fair performance on static balance intervention c occasional hand assist required for balance corrections during trial.  Mobility of Rt ankle still limited overall but improving.  [facility]  Continued skilled PT services    Personal Factors and Comorbidities Comorbidity 3+    Comorbidities DDD cervical, HTN, extensive vestibular complication history    Examination-Activity Limitations Bend;Squat;Stairs;Carry;Stand;Transfers;Dressing;Locomotion Level    Examination-Participation Restrictions Cleaning;Community Activity;Shop;Driving;Yard Work;Laundry;Occupation    Stability/Clinical Decision Making Evolving/Moderate complexity    Rehab Potential Good    PT Frequency 2x / week    PT Duration 8 weeks    PT Treatment/Interventions ADLs/Self Care Home Management;Cryotherapy;Electrical Stimulation;Iontophoresis 4mg /ml Dexamethasone;Moist Heat;Balance training;Therapeutic exercise;Therapeutic  activities;Functional mobility training;Stair training;Gait training;DME Instruction;Ultrasound;Neuromuscular re-education;Patient/family education;Passive range of motion;Taping;Vasopneumatic Device;Manual techniques;Canalith Repostioning;Vestibular    PT Next Visit Plan Static balance transitioning to compliant surface, continued strengthening, functional mobility training (stairs, etc)    PT Home Exercise Plan [email or web address]    Consulted and Agree with Plan of Care Patient             Patient will benefit from skilled therapeutic intervention in order to improve the following deficits and impairments:  Abnormal gait, Decreased endurance, Hypomobility, Increased edema, Decreased activity tolerance, Decreased strength, Pain, Decreased balance, Decreased mobility, Difficulty walking, Impaired perceived functional ability, Improper body mechanics, Impaired flexibility, Decreased coordination, Decreased range of motion, Dizziness  Visit Diagnosis: Pain in right ankle and joints of right foot  Muscle weakness (generalized)  Stiffness of ankle joint, right  Difficulty in walking, not elsewhere classified  Localized edema     Problem List Patient Active Problem List   Diagnosis Date Noted   Impingement of right ankle joint    Gastric varices without bleeding 12/06/2020   Portal hypertension (HCC) 09/30/2020   Dilated bile duct 09/30/2020   Alcoholic cirrhosis of liver with ascites (HCC) 09/29/2020   abdominal acites 09/17/2020   Right foot pain 09/17/2020   Laceration of lesser toe of right foot without foreign body present or damage to nail 09/17/2020   Acute right ankle pain 09/17/2020   Frequent falls 09/14/2020   Closed fracture of ankle 09/14/2020   Foot sprain, right, subsequent encounter 08/26/2020  Sprain of anterior talofibular ligament of right ankle 08/26/2020   Splenomegaly 08/09/2020   Malnutrition of moderate degree (HCC) 08/08/2020   Transaminitis 08/06/2020   Serum  total bilirubin elevated 08/06/2020   Fall at home, initial encounter 08/06/2020   Generalized weakness 08/05/2020   Idiopathic peripheral neuropathy 07/13/2020   Type 2 diabetes mellitus with diabetic polyneuropathy, with long-term current use of insulin (HCC) 07/13/2020   Persistent cough for 3 weeks or longer 07/04/2020   Low HDL (under 40) 06/21/2020   Post-nasal drip 06/21/2020   Allergic rhinitis 06/21/2020   Elevated alkaline phosphatase level 06/21/2020   Duodenitis - edema ? andgioedema 10/30/2018   Chronic alcoholic pancreatitis (HCC) 09/02/2018   Diabetes mellitus with circulatory complication, HTN (HCC) 08/22/2018   Dizziness 12/26/2017   Chronic pain 05/17/2017   Osteoarthritis of spine with radiculopathy, cervical region 09/17/2016   B12 deficiency 10/21/2014   Neuropathic pain of both legs 02/11/2014   Hepatic steatosis 04/27/2013   Hyperlipidemia associated with type 2 diabetes mellitus (HCC) 11/03/2008   Hypertension associated with diabetes (HCC) 09/29/2008   PAP SMEAR, ABNORMAL 09/29/2008   Chyrel Masson, PT, DPT, OCS, ATC 01/12/21  10:51 AM    [facility] Physical Therapy [address] [place], [place], [address] Phone: [phone]   Fax:  [phone]  Name: Mallory Stewart MRN: [id number] Date of Birth: 1960-09-15

## 2021-01-12 NOTE — Telephone Encounter (Signed)
Called and informed pt, she said that she just p/u medication from the pharmacy.

## 2021-01-13 ENCOUNTER — Ambulatory Visit: Payer: 59 | Admitting: Internal Medicine

## 2021-01-13 NOTE — Telephone Encounter (Signed)
Left message for patient to call back  

## 2021-01-16 NOTE — Telephone Encounter (Signed)
Patient reports that she has a tight abdomen.  I am not as uncomfortable as it happened the last time.  She has not gained any weight.  She is maintained on furosemide 20 and spironolactone 50 mg daily.  She I leaving on a business trip tomorrow through Friday.

## 2021-01-16 NOTE — Telephone Encounter (Signed)
Patient notified  She is aware she will be contacted directly by [facility] scheduling to arrange [place]

## 2021-01-16 NOTE — Telephone Encounter (Addendum)
She needs an ultrasound assessment to see if she has significant ascites though with stable weight seems unlikely.  Suggest complete abd [place] re: abdominal distention, ascites (hx) and hx pancreatitis  Does not sound urgent but if she has concerns about going on trip she may need to go to an ED today

## 2021-01-17 ENCOUNTER — Encounter: Payer: 59 | Admitting: Rehabilitative and Restorative Service Providers"

## 2021-01-19 ENCOUNTER — Encounter: Payer: 59 | Admitting: Rehabilitative and Restorative Service Providers"

## 2021-01-26 ENCOUNTER — Other Ambulatory Visit: Payer: Self-pay

## 2021-01-26 ENCOUNTER — Ambulatory Visit (HOSPITAL_COMMUNITY)
Admission: RE | Admit: 2021-01-26 | Discharge: 2021-01-26 | Disposition: A | Discharge status: Home / Self Care | Payer: 59 | Source: Ambulatory Visit | Attending: Internal Medicine | Admitting: Internal Medicine

## 2021-01-26 DIAGNOSIS — K7031 Alcoholic cirrhosis of liver with ascites: Secondary | ICD-10-CM | POA: Insufficient documentation

## 2021-01-27 ENCOUNTER — Encounter: Payer: 59 | Admitting: Physical Therapy

## 2021-01-27 ENCOUNTER — Other Ambulatory Visit: Payer: Self-pay | Admitting: Internal Medicine

## 2021-01-27 DIAGNOSIS — K7031 Alcoholic cirrhosis of liver with ascites: Secondary | ICD-10-CM

## 2021-01-27 MED ORDER — SPIRONOLACTONE 100 MG PO TABS: 100.0000 mg | ORAL_TABLET | Freq: Every day | ORAL | 1 refills | Status: DC

## 2021-01-27 MED ORDER — FUROSEMIDE 40 MG PO TABS: 40.0000 mg | ORAL_TABLET | Freq: Every day | ORAL | 1 refills | Status: DC

## 2021-01-31 ENCOUNTER — Ambulatory Visit: Payer: 59 | Admitting: Rehabilitative and Restorative Service Providers"

## 2021-01-31 ENCOUNTER — Other Ambulatory Visit: Payer: Self-pay

## 2021-01-31 ENCOUNTER — Encounter: Payer: Self-pay | Admitting: Rehabilitative and Restorative Service Providers"

## 2021-01-31 DIAGNOSIS — M25571 Pain in right ankle and joints of right foot: Secondary | ICD-10-CM | POA: Diagnosis not present

## 2021-01-31 DIAGNOSIS — R262 Difficulty in walking, not elsewhere classified: Secondary | ICD-10-CM

## 2021-01-31 DIAGNOSIS — R600 Localized edema: Secondary | ICD-10-CM

## 2021-01-31 DIAGNOSIS — M6281 Muscle weakness (generalized): Secondary | ICD-10-CM | POA: Diagnosis not present

## 2021-01-31 DIAGNOSIS — M25671 Stiffness of right ankle, not elsewhere classified: Secondary | ICD-10-CM

## 2021-01-31 NOTE — Therapy (Addendum)
[facility] Physical Therapy [address] [place], [place], [address] Phone: [phone]   Fax:  [phone]  Physical Therapy Treatment/Discharge  Patient Details  Name: Mallory Stewart MRN: [id number] Date of Birth: 09/23/1960 Referring Provider (PT): Dr. Meridee Score   Encounter Date: 01/31/2021   PT End of Session - 01/31/21 0852     Visit Number 12    Number of Visits 18    Date for PT Re-Evaluation 03/07/21    Authorization Type UHC 20 PT visits per year    Authorization - Visit Number 12    Authorization - Number of Visits 18    Progress Note Due on Visit 18    PT Start Time 0846    PT Stop Time 0936    PT Time Calculation (min) 50 min    Activity Tolerance Patient limited by pain    Behavior During Therapy [facility] for tasks assessed/performed             Past Medical History:  Diagnosis Date   Alcohol-induced chronic pancreatitis ([place])    B12 deficiency    Concussion    DDD (degenerative disc disease), cervical    Diabetes ([place])    DKA, type 2 ([place])    Gastric outlet obstruction 08/12/2018   Hypertension    Neuropathy    Pancreatic pseudocyst/cyst 05/06/2013   Seasonal allergies     Past Surgical History:  Procedure Laterality Date   ANKLE ARTHROSCOPY Right 12/20/2020   Procedure: RIGHT ANKLE ARTHROSCOPIC DEBRIDEMENT;  Surgeon: Newt Minion, MD;  Location: [place];  Service: Orthopedics;  Laterality: Right;   BIOPSY  01/19/2019   Procedure: BIOPSY;  Surgeon: Rush Landmark Telford Nab., MD;  Location: [place];  Service: Gastroenterology;;   CHOLECYSTECTOMY N/A 07/28/2016   Procedure: LAPAROSCOPIC CHOLECYSTECTOMY WITH INTRAOPERATIVE CHOLANGIOGRAM;  Surgeon: Mickeal Skinner, MD;  Location: [place];  Service: General;  Laterality: N/A;   ESOPHAGOGASTRODUODENOSCOPY (EGD) WITH PROPOFOL N/A 08/13/2018   Procedure: ESOPHAGOGASTRODUODENOSCOPY (EGD) WITH PROPOFOL;  Surgeon: Irving Copas., MD;  Location: [place];  Service: Gastroenterology;  Laterality: N/A;   ESOPHAGOGASTRODUODENOSCOPY (EGD) WITH PROPOFOL N/A 01/19/2019   Procedure: ESOPHAGOGASTRODUODENOSCOPY (EGD) WITH PROPOFOL;  Surgeon: Rush Landmark Telford Nab., MD;  Location: [place];  Service: Gastroenterology;  Laterality: N/A;   IR PARACENTESIS  08/08/2020   LEEP  1990's   ORIF FOOT FRACTURE  09/2008   L 5th metatarsal   REFRACTIVE SURGERY  2000   TONSILLECTOMY     UPPER ESOPHAGEAL ENDOSCOPIC ULTRASOUND (EUS) N/A 08/13/2018   Procedure: UPPER ESOPHAGEAL ENDOSCOPIC ULTRASOUND (EUS);  Surgeon: Irving Copas., MD;  Location: [place];  Service: Gastroenterology;  Laterality: N/A;   UPPER ESOPHAGEAL ENDOSCOPIC ULTRASOUND (EUS) N/A 01/19/2019   Procedure: UPPER ESOPHAGEAL ENDOSCOPIC ULTRASOUND (EUS);  Surgeon: Irving Copas., MD;  Location: [place];  Service: Gastroenterology;  Laterality: N/A;    There were no vitals filed for this visit.   Subjective Assessment - 01/31/21 0851     Subjective Pt. indicated feeling about a 5/10 today.  Pt. stated feeling general increase in pain in last week, insidious worsening noted.  cancelled last visit due to pain c walking.    Limitations Standing;Walking;House hold activities    Patient Stated Goals Reduce pain, walking independent/swimming, dog walks    Currently in Pain? Yes    Pain Score 5     Pain Location Ankle    Pain Orientation Right    Pain Descriptors / Indicators Sore;Constant;Aching    Pain  Type Chronic pain    Pain Onset More than a month ago    Pain Frequency Constant    Aggravating Factors  worse c pressure    Pain Relieving Factors resting some, medicine some                [facility] PT Assessment - 01/31/21 0001       Assessment   Medical Diagnosis Rt ankle ORIF s/p trimalleolar fracture, debridement 12/20/2020    Referring Provider (PT) Dr. Meridee Score    Onset Date/Surgical Date 09/10/20      AROM   Right Ankle Dorsiflexion 0   in  seated 90 deg knee flexion., -5 lacking in 0 deg knee extension     Ambulation/Gait   Gait Comments Indepenent ambulation, decreased stance on Rt, decreased toe off progression, antalgic                           [facility] Adult PT Treatment/Exercise - 01/31/21 0001       Manual Therapy   Manual therapy comments DF mobilization c movement c posterior talocrural glide in lunge positioning on setp 2 x10      Ankle Exercises: Stretches   Gastroc Stretch 60 seconds;3 reps   incilne board, bilateral     Ankle Exercises: Machines for Strengthening   Cybex Leg Press Double leg 81 lbs 2 x15. 25 lbs Rt 2 x 15, PF on Rt x 15 12 lbs      Ankle Exercises: Standing   Other Standing Ankle Exercises elevated DF/PF 2 x 20, inversion/eversion 2 x 20, abc's x 2, in elevation green band 20 x pf, df                       PT Short Term Goals - 01/31/21 0915       PT SHORT TERM GOAL #1   Title Patient will demonstrate independent use of home exercise program to maintain progress from in clinic treatments.    Time 3    Period Weeks    Status Achieved    Target Date 01/31/21               PT Long Term Goals - 01/10/21 1014       PT LONG TERM GOAL #1   Title Patient will demonstrate/report pain at worst less than or equal to 2/10 to facilitate minimal limitation in daily activity secondary to pain symptoms.    Time 8    Period Weeks    Status Revised    Target Date 03/07/21      PT LONG TERM GOAL #2   Title Patient will demonstrate independent use of home exercise program to facilitate ability to maintain/progress functional gains from skilled physical therapy services.    Time 8    Period Weeks    Status Revised    Target Date 03/07/21      PT LONG TERM GOAL #3   Title Pt. will demonstrate FOTO outcome > or = 72 to indicated reduced disability due to condition.    Time 8    Period Weeks    Status Revised    Target Date 03/07/21      PT LONG TERM GOAL  #4   Title Pt. will demonstrate independent community ambulation > 300 ft s deviation to facilitate usual daily walking at PLOF.    Time 10    Period Weeks    Status  Revised    Target Date 03/07/21      PT LONG TERM GOAL #5   Title Pt. will demonstrate Rt ankle df > = 5 degrees, inversion/eversion > 25 degrees to facilitate usual mobility for daily walking, standing, transfers at PLOF.    Time 8    Period Weeks    Status Revised    Target Date 03/07/21      PT LONG TERM GOAL #6   Title Pt. will demonstrate Rt ankle MMT 5/5 throughout to facilitate usual daily and recreational activity at PLOF.    Time 8    Period Weeks    Status Revised    Target Date 03/07/21      PT LONG TERM GOAL #7   Title Pt. will demonstrate single leg stance bilateral > 10 seconds to facilitate improved stability in ambulation.    Time 8    Period Weeks    Status Revised    Target Date 03/07/21                   Plan - 01/31/21 0857     Clinical Impression Statement Continued elevated severity and irritability of symptoms, noted in both insidious instances at home without aggravating factors as well as in WB pressure primarily while in clinic.  Symptoms presentation does impair continued progression at this time.  Impairment in DF mobility still noted to this point as well as strength and movement coordination control.  Skilled PT services warranted to continue.    Personal Factors and Comorbidities Comorbidity 3+    Comorbidities DDD cervical, HTN, extensive vestibular complication history    Examination-Activity Limitations Bend;Squat;Stairs;Carry;Stand;Transfers;Dressing;Locomotion Level    Examination-Participation Restrictions Cleaning;Community Activity;Shop;Driving;Yard Work;Laundry;Occupation    Stability/Clinical Decision Making Evolving/Moderate complexity    Rehab Potential Good    PT Frequency 2x / week    PT Duration 8 weeks    PT Treatment/Interventions ADLs/Self Care Home  Management;Cryotherapy;Electrical Stimulation;Iontophoresis 4mg /ml Dexamethasone;Moist Heat;Balance training;Therapeutic exercise;Therapeutic activities;Functional mobility training;Stair training;Gait training;DME Instruction;Ultrasound;Neuromuscular re-education;Patient/family education;Passive range of motion;Taping;Vasopneumatic Device;Manual techniques;Canalith Repostioning;Vestibular    PT Next Visit Plan Continue to progress ankle mobility, strength and movement coordination on compliant and non compliant surfaces to tolerance.    PT Home Exercise Plan [email or web address]    Consulted and Agree with Plan of Care Patient             Patient will benefit from skilled therapeutic intervention in order to improve the following deficits and impairments:  Abnormal gait, Decreased endurance, Hypomobility, Increased edema, Decreased activity tolerance, Decreased strength, Pain, Decreased balance, Decreased mobility, Difficulty walking, Impaired perceived functional ability, Improper body mechanics, Impaired flexibility, Decreased coordination, Decreased range of motion, Dizziness  Visit Diagnosis: Pain in right ankle and joints of right foot  Muscle weakness (generalized)  Stiffness of ankle joint, right  Difficulty in walking, not elsewhere classified  Localized edema     Problem List Patient Active Problem List   Diagnosis Date Noted   Impingement of right ankle joint    Gastric varices without bleeding 12/06/2020   Portal hypertension ([place]) 09/30/2020   Dilated bile duct 33/29/5188   Alcoholic cirrhosis of liver with ascites ([place]) 09/29/2020   abdominal acites 09/17/2020   Right foot pain 09/17/2020   Laceration of lesser toe of right foot without foreign body present or damage to nail 09/17/2020   Acute right ankle pain 09/17/2020   Frequent falls 09/14/2020   Closed fracture of ankle 09/14/2020   Foot sprain, right, subsequent encounter 08/26/2020  Sprain of anterior talofibular  ligament of right ankle 08/26/2020   Splenomegaly 08/09/2020   Malnutrition of moderate degree ([place]) 08/08/2020   Transaminitis 08/06/2020   Serum total bilirubin elevated 08/06/2020   Fall at home, initial encounter 08/06/2020   Generalized weakness 08/05/2020   Idiopathic peripheral neuropathy 07/13/2020   Type 2 diabetes mellitus with diabetic polyneuropathy, with long-term current use of insulin ([place]) 07/13/2020   Persistent cough for 3 weeks or longer 07/04/2020   Low HDL (under 40) 06/21/2020   Post-nasal drip 06/21/2020   Allergic rhinitis 06/21/2020   Elevated alkaline phosphatase level 06/21/2020   Duodenitis - edema ? andgioedema 10/30/2018   Chronic alcoholic pancreatitis ([place]) 09/02/2018   Diabetes mellitus with circulatory complication, HTN ([place]) 68/12/8108   Dizziness 12/26/2017   Chronic pain 05/17/2017   Osteoarthritis of spine with radiculopathy, cervical region 09/17/2016   B12 deficiency 10/21/2014   Neuropathic pain of both legs 02/11/2014   Hepatic steatosis 04/27/2013   Hyperlipidemia associated with type 2 diabetes mellitus ([place]) 11/03/2008   Hypertension associated with diabetes ([place]) 09/29/2008   PAP SMEAR, ABNORMAL 09/29/2008    Scot Jun, PT, DPT, OCS, ATC 01/31/21  9:24 AM  PHYSICAL THERAPY DISCHARGE SUMMARY  Visits from Start of Care: 12  Current functional level related to goals / functional outcomes: See note   Remaining deficits: See note   Education / Equipment: HEP   Patient agrees to discharge. Patient goals were partially met. Patient is being discharged due to not returning since the last visit.  Scot Jun, PT, DPT, OCS, ATC 03/07/21  1:48 PM       [facility] [address] [place], [place], [address] Phone: [phone]   Fax:  [phone]  Name: GUSTIE BOBB MRN: [id number] Date of Birth: 05-19-1960

## 2021-02-01 ENCOUNTER — Other Ambulatory Visit: Payer: Self-pay | Admitting: Family Medicine

## 2021-02-01 ENCOUNTER — Other Ambulatory Visit: Payer: Self-pay | Admitting: Physician Assistant

## 2021-02-02 ENCOUNTER — Encounter: Payer: 59 | Admitting: Rehabilitative and Restorative Service Providers"

## 2021-02-08 ENCOUNTER — Encounter: Payer: 59 | Admitting: Rehabilitative and Restorative Service Providers"

## 2021-02-10 ENCOUNTER — Encounter: Payer: 59 | Admitting: Rehabilitative and Restorative Service Providers"

## 2021-02-10 ENCOUNTER — Telehealth: Payer: Self-pay | Admitting: Orthopedic Surgery

## 2021-02-10 MED ORDER — OXYCODONE-ACETAMINOPHEN 5-325 MG PO TABS: 1.0000 | ORAL_TABLET | Freq: Two times a day (BID) | ORAL | 0 refills | Status: DC | PRN

## 2021-02-10 NOTE — Addendum Note (Signed)
Addended by: Barnie Del R on: 02/10/2021 11:50 AM   Modules accepted: Orders

## 2021-02-10 NOTE — Telephone Encounter (Signed)
Pt would like refill on oxycodone

## 2021-02-14 ENCOUNTER — Encounter: Payer: 59 | Admitting: Rehabilitative and Restorative Service Providers"

## 2021-02-16 ENCOUNTER — Ambulatory Visit: Payer: 59 | Admitting: Internal Medicine

## 2021-02-20 ENCOUNTER — Ambulatory Visit: Payer: 59 | Admitting: Internal Medicine

## 2021-02-22 ENCOUNTER — Encounter: Payer: Self-pay | Admitting: Rehabilitative and Restorative Service Providers"

## 2021-02-24 ENCOUNTER — Encounter: Payer: Self-pay | Admitting: Rehabilitative and Restorative Service Providers"

## 2021-03-03 ENCOUNTER — Other Ambulatory Visit: Payer: Self-pay | Admitting: Family Medicine

## 2021-03-03 ENCOUNTER — Other Ambulatory Visit: Payer: Self-pay | Admitting: Physician Assistant

## 2021-03-03 NOTE — Telephone Encounter (Signed)
Last filled on 11/08/20 #30 with 3 refills LOV 08/26/20 and no future appointments made

## 2021-03-12 ENCOUNTER — Encounter (HOSPITAL_COMMUNITY)
Admission: EM | Disposition: A | Discharge status: Home Health | Payer: Self-pay | Source: Home / Self Care | Attending: Specialist

## 2021-03-12 ENCOUNTER — Inpatient Hospital Stay (HOSPITAL_COMMUNITY): Payer: 59 | Admitting: Anesthesiology

## 2021-03-12 ENCOUNTER — Inpatient Hospital Stay (HOSPITAL_COMMUNITY): Payer: 59

## 2021-03-12 ENCOUNTER — Emergency Department (HOSPITAL_COMMUNITY): Payer: 59

## 2021-03-12 ENCOUNTER — Other Ambulatory Visit: Payer: Self-pay

## 2021-03-12 ENCOUNTER — Encounter (HOSPITAL_COMMUNITY): Payer: Self-pay | Admitting: *Deleted

## 2021-03-12 ENCOUNTER — Inpatient Hospital Stay (HOSPITAL_COMMUNITY)
Admission: EM | Admit: 2021-03-12 | Discharge: 2021-03-17 | DRG: 493 | Disposition: A | Discharge status: Home Health | Payer: 59 | Attending: Specialist | Admitting: Specialist

## 2021-03-12 DIAGNOSIS — E538 Deficiency of other specified B group vitamins: Secondary | ICD-10-CM | POA: Diagnosis present

## 2021-03-12 DIAGNOSIS — S82232A Displaced oblique fracture of shaft of left tibia, initial encounter for closed fracture: Principal | ICD-10-CM | POA: Diagnosis present

## 2021-03-12 DIAGNOSIS — S82432A Displaced oblique fracture of shaft of left fibula, initial encounter for closed fracture: Secondary | ICD-10-CM | POA: Diagnosis present

## 2021-03-12 DIAGNOSIS — S82242A Displaced spiral fracture of shaft of left tibia, initial encounter for closed fracture: Secondary | ICD-10-CM | POA: Diagnosis present

## 2021-03-12 DIAGNOSIS — S82442A Displaced spiral fracture of shaft of left fibula, initial encounter for closed fracture: Secondary | ICD-10-CM | POA: Diagnosis present

## 2021-03-12 DIAGNOSIS — E875 Hyperkalemia: Secondary | ICD-10-CM | POA: Diagnosis present

## 2021-03-12 DIAGNOSIS — D62 Acute posthemorrhagic anemia: Secondary | ICD-10-CM | POA: Diagnosis not present

## 2021-03-12 DIAGNOSIS — W010XXA Fall on same level from slipping, tripping and stumbling without subsequent striking against object, initial encounter: Secondary | ICD-10-CM | POA: Diagnosis present

## 2021-03-12 DIAGNOSIS — N179 Acute kidney failure, unspecified: Secondary | ICD-10-CM | POA: Diagnosis present

## 2021-03-12 DIAGNOSIS — R52 Pain, unspecified: Secondary | ICD-10-CM

## 2021-03-12 DIAGNOSIS — M5030 Other cervical disc degeneration, unspecified cervical region: Secondary | ICD-10-CM | POA: Diagnosis present

## 2021-03-12 DIAGNOSIS — Z7984 Long term (current) use of oral hypoglycemic drugs: Secondary | ICD-10-CM

## 2021-03-12 DIAGNOSIS — Z823 Family history of stroke: Secondary | ICD-10-CM

## 2021-03-12 DIAGNOSIS — R339 Retention of urine, unspecified: Secondary | ICD-10-CM | POA: Diagnosis not present

## 2021-03-12 DIAGNOSIS — I8510 Secondary esophageal varices without bleeding: Secondary | ICD-10-CM

## 2021-03-12 DIAGNOSIS — K766 Portal hypertension: Secondary | ICD-10-CM | POA: Diagnosis present

## 2021-03-12 DIAGNOSIS — I10 Essential (primary) hypertension: Secondary | ICD-10-CM | POA: Diagnosis present

## 2021-03-12 DIAGNOSIS — K7031 Alcoholic cirrhosis of liver with ascites: Secondary | ICD-10-CM | POA: Diagnosis present

## 2021-03-12 DIAGNOSIS — Z20822 Contact with and (suspected) exposure to covid-19: Secondary | ICD-10-CM | POA: Diagnosis present

## 2021-03-12 DIAGNOSIS — D696 Thrombocytopenia, unspecified: Secondary | ICD-10-CM | POA: Diagnosis not present

## 2021-03-12 DIAGNOSIS — Z419 Encounter for procedure for purposes other than remedying health state, unspecified: Secondary | ICD-10-CM

## 2021-03-12 DIAGNOSIS — I864 Gastric varices: Secondary | ICD-10-CM | POA: Diagnosis present

## 2021-03-12 DIAGNOSIS — I951 Orthostatic hypotension: Secondary | ICD-10-CM | POA: Diagnosis present

## 2021-03-12 DIAGNOSIS — F32A Depression, unspecified: Secondary | ICD-10-CM | POA: Diagnosis present

## 2021-03-12 DIAGNOSIS — Y92012 Bathroom of single-family (private) house as the place of occurrence of the external cause: Secondary | ICD-10-CM

## 2021-03-12 DIAGNOSIS — Z791 Long term (current) use of non-steroidal anti-inflammatories (NSAID): Secondary | ICD-10-CM

## 2021-03-12 DIAGNOSIS — Z01811 Encounter for preprocedural respiratory examination: Secondary | ICD-10-CM

## 2021-03-12 DIAGNOSIS — E1142 Type 2 diabetes mellitus with diabetic polyneuropathy: Secondary | ICD-10-CM | POA: Diagnosis present

## 2021-03-12 DIAGNOSIS — K3189 Other diseases of stomach and duodenum: Secondary | ICD-10-CM | POA: Diagnosis present

## 2021-03-12 DIAGNOSIS — Z794 Long term (current) use of insulin: Secondary | ICD-10-CM

## 2021-03-12 DIAGNOSIS — S82839A Other fracture of upper and lower end of unspecified fibula, initial encounter for closed fracture: Secondary | ICD-10-CM | POA: Diagnosis present

## 2021-03-12 DIAGNOSIS — E1165 Type 2 diabetes mellitus with hyperglycemia: Secondary | ICD-10-CM | POA: Diagnosis present

## 2021-03-12 DIAGNOSIS — F419 Anxiety disorder, unspecified: Secondary | ICD-10-CM | POA: Diagnosis present

## 2021-03-12 DIAGNOSIS — Z79899 Other long term (current) drug therapy: Secondary | ICD-10-CM

## 2021-03-12 DIAGNOSIS — R188 Other ascites: Secondary | ICD-10-CM

## 2021-03-12 DIAGNOSIS — E871 Hypo-osmolality and hyponatremia: Secondary | ICD-10-CM | POA: Diagnosis not present

## 2021-03-12 DIAGNOSIS — Z803 Family history of malignant neoplasm of breast: Secondary | ICD-10-CM

## 2021-03-12 DIAGNOSIS — S82302A Unspecified fracture of lower end of left tibia, initial encounter for closed fracture: Secondary | ICD-10-CM | POA: Diagnosis present

## 2021-03-12 DIAGNOSIS — Z91018 Allergy to other foods: Secondary | ICD-10-CM

## 2021-03-12 DIAGNOSIS — Z888 Allergy status to other drugs, medicaments and biological substances status: Secondary | ICD-10-CM

## 2021-03-12 DIAGNOSIS — Z8042 Family history of malignant neoplasm of prostate: Secondary | ICD-10-CM

## 2021-03-12 DIAGNOSIS — Z8249 Family history of ischemic heart disease and other diseases of the circulatory system: Secondary | ICD-10-CM

## 2021-03-12 DIAGNOSIS — Z808 Family history of malignant neoplasm of other organs or systems: Secondary | ICD-10-CM

## 2021-03-12 HISTORY — PX: TIBIA IM NAIL INSERTION: SHX2516

## 2021-03-12 LAB — BASIC METABOLIC PANEL
Anion gap: 13 (ref 5–15)
Anion gap: 12 (ref 5–15)
Anion gap: 8 (ref 5–15)
BUN: 32 mg/dL — ABNORMAL HIGH (ref 6–20)
BUN: 32 mg/dL — ABNORMAL HIGH (ref 6–20)
BUN: 28 mg/dL — ABNORMAL HIGH (ref 6–20)
CO2: 20 mmol/L — ABNORMAL LOW (ref 22–32)
CO2: 20 mmol/L — ABNORMAL LOW (ref 22–32)
CO2: 25 mmol/L (ref 22–32)
Calcium: 8.5 mg/dL — ABNORMAL LOW (ref 8.9–10.3)
Calcium: 8.8 mg/dL — ABNORMAL LOW (ref 8.9–10.3)
Calcium: 8.6 mg/dL — ABNORMAL LOW (ref 8.9–10.3)
Chloride: 100 mmol/L (ref 98–111)
Chloride: 100 mmol/L (ref 98–111)
Chloride: 97 mmol/L — ABNORMAL LOW (ref 98–111)
Creatinine, Ser: 1.78 mg/dL — ABNORMAL HIGH (ref 0.44–1.00)
Creatinine, Ser: 1.74 mg/dL — ABNORMAL HIGH (ref 0.44–1.00)
Creatinine, Ser: 1.62 mg/dL — ABNORMAL HIGH (ref 0.44–1.00)
GFR, Estimated: 33 mL/min — ABNORMAL LOW (ref >60)
GFR, Estimated: 34 mL/min — ABNORMAL LOW (ref >60)
GFR, Estimated: 37 mL/min — ABNORMAL LOW (ref >60)
Glucose, Bld: 314 mg/dL — ABNORMAL HIGH (ref 70–99)
Glucose, Bld: 323 mg/dL — ABNORMAL HIGH (ref 70–99)
Glucose, Bld: 209 mg/dL — ABNORMAL HIGH (ref 70–99)
Potassium: 4.7 mmol/L (ref 3.5–5.1)
Potassium: 5.5 mmol/L — ABNORMAL HIGH (ref 3.5–5.1)
Potassium: 5.5 mmol/L — ABNORMAL HIGH (ref 3.5–5.1)
Sodium: 133 mmol/L — ABNORMAL LOW (ref 135–145)
Sodium: 132 mmol/L — ABNORMAL LOW (ref 135–145)
Sodium: 130 mmol/L — ABNORMAL LOW (ref 135–145)

## 2021-03-12 LAB — RESP PANEL BY RT-PCR (FLU A&B, COVID) ARPGX2
Influenza A by PCR: NEGATIVE
Influenza B by PCR: NEGATIVE
SARS Coronavirus 2 by RT PCR: NEGATIVE

## 2021-03-12 LAB — GLUCOSE, CAPILLARY
Glucose-Capillary: 216 mg/dL — ABNORMAL HIGH (ref 70–99)
Glucose-Capillary: 253 mg/dL — ABNORMAL HIGH (ref 70–99)
Glucose-Capillary: 170 mg/dL — ABNORMAL HIGH (ref 70–99)
Glucose-Capillary: 96 mg/dL (ref 70–99)
Glucose-Capillary: 196 mg/dL — ABNORMAL HIGH (ref 70–99)

## 2021-03-12 LAB — TYPE AND SCREEN
ABO/RH(D): AB NEG
Antibody Screen: NEGATIVE

## 2021-03-12 LAB — CBC
HCT: 30.7 % — ABNORMAL LOW (ref 36.0–46.0)
HCT: 30 % — ABNORMAL LOW (ref 36.0–46.0)
Hemoglobin: 10.5 g/dL — ABNORMAL LOW (ref 12.0–15.0)
Hemoglobin: 10 g/dL — ABNORMAL LOW (ref 12.0–15.0)
MCH: 30.3 pg (ref 26.0–34.0)
MCH: 29.6 pg (ref 26.0–34.0)
MCHC: 34.2 g/dL (ref 30.0–36.0)
MCHC: 33.3 g/dL (ref 30.0–36.0)
MCV: 88.5 fL (ref 80.0–100.0)
MCV: 88.8 fL (ref 80.0–100.0)
Platelets: 144 10*3/uL — ABNORMAL LOW (ref 150–400)
Platelets: 147 10*3/uL — ABNORMAL LOW (ref 150–400)
RBC: 3.47 MIL/uL — ABNORMAL LOW (ref 3.87–5.11)
RBC: 3.38 MIL/uL — ABNORMAL LOW (ref 3.87–5.11)
RDW: 13.9 % (ref 11.5–15.5)
RDW: 13.9 % (ref 11.5–15.5)
WBC: 4.6 10*3/uL (ref 4.0–10.5)
WBC: 5 10*3/uL (ref 4.0–10.5)
nRBC: 0 % (ref 0.0–0.2)
nRBC: 0 % (ref 0.0–0.2)

## 2021-03-12 LAB — HEMOGLOBIN A1C
Hgb A1c MFr Bld: 7.9 % — ABNORMAL HIGH (ref 4.8–5.6)
Mean Plasma Glucose: 180.03 mg/dL

## 2021-03-12 LAB — CBG MONITORING, ED: Glucose-Capillary: 269 mg/dL — ABNORMAL HIGH (ref 70–99)

## 2021-03-12 LAB — ABO/RH: ABO/RH(D): AB NEG

## 2021-03-12 LAB — VITAMIN D 25 HYDROXY (VIT D DEFICIENCY, FRACTURES): Vit D, 25-Hydroxy: 26.51 ng/mL — ABNORMAL LOW (ref 30–100)

## 2021-03-12 SURGERY — INSERTION, INTRAMEDULLARY ROD, TIBIA
Anesthesia: General | Site: Leg Lower | Laterality: Left

## 2021-03-12 MED ORDER — HYDROMORPHONE HCL 1 MG/ML IJ SOLN
INTRAMUSCULAR | Status: AC
  Filled 2021-03-12: qty 1

## 2021-03-12 MED ORDER — AMITRIPTYLINE HCL 50 MG PO TABS
50.0000 mg | ORAL_TABLET | Freq: Every day | ORAL | Status: DC
  Administered 2021-03-12 – 2021-03-16 (×5): 50 mg via ORAL
  Filled 2021-03-12 (×5): qty 1

## 2021-03-12 MED ORDER — CEFAZOLIN SODIUM-DEXTROSE 2-4 GM/100ML-% IV SOLN
INTRAVENOUS | Status: AC
  Filled 2021-03-12: qty 100

## 2021-03-12 MED ORDER — HYDROMORPHONE HCL 1 MG/ML IJ SOLN: 1.0000 mg | Freq: Once | INTRAMUSCULAR | Status: AC

## 2021-03-12 MED ORDER — FENTANYL CITRATE (PF) 100 MCG/2ML IJ SOLN
INTRAMUSCULAR | Status: DC | PRN
  Administered 2021-03-12 (×2): 25 ug via INTRAVENOUS
  Administered 2021-03-12 (×2): 50 ug via INTRAVENOUS
  Administered 2021-03-12: 25 ug via INTRAVENOUS

## 2021-03-12 MED ORDER — ONDANSETRON HCL 4 MG/2ML IJ SOLN
INTRAMUSCULAR | Status: AC
  Filled 2021-03-12: qty 2

## 2021-03-12 MED ORDER — LACTATED RINGERS IV SOLN: INTRAVENOUS | Status: DC

## 2021-03-12 MED ORDER — PROPOFOL 10 MG/ML IV BOLUS
INTRAVENOUS | Status: DC | PRN
  Administered 2021-03-12: 120 mg via INTRAVENOUS

## 2021-03-12 MED ORDER — PROMETHAZINE HCL 25 MG/ML IJ SOLN: 6.2500 mg | INTRAMUSCULAR | Status: DC | PRN

## 2021-03-12 MED ORDER — OXYCODONE HCL 5 MG PO TABS
10.0000 mg | ORAL_TABLET | ORAL | Status: DC | PRN
  Administered 2021-03-13: 15 mg via ORAL
  Administered 2021-03-16: 10 mg via ORAL
  Administered 2021-03-16: 15 mg via ORAL
  Administered 2021-03-17: 10 mg via ORAL
  Filled 2021-03-12 (×2): qty 2
  Filled 2021-03-12 (×3): qty 3

## 2021-03-12 MED ORDER — ADULT MULTIVITAMIN W/MINERALS CH
1.0000 | ORAL_TABLET | Freq: Every day | ORAL | Status: DC
  Administered 2021-03-12: 1 via ORAL
  Filled 2021-03-12: qty 1

## 2021-03-12 MED ORDER — MIDAZOLAM HCL 2 MG/2ML IJ SOLN
INTRAMUSCULAR | Status: DC | PRN
Start: 2021-03-12 — End: 2021-03-12
  Administered 2021-03-12: 2 mg via INTRAVENOUS

## 2021-03-12 MED ORDER — METOPROLOL TARTRATE 25 MG PO TABS
50.0000 mg | ORAL_TABLET | Freq: Two times a day (BID) | ORAL | Status: DC
  Administered 2021-03-12: 50 mg via ORAL
  Filled 2021-03-12: qty 2

## 2021-03-12 MED ORDER — SODIUM CHLORIDE 0.45 % IV SOLN: INTRAVENOUS | Status: DC

## 2021-03-12 MED ORDER — GABAPENTIN 300 MG PO CAPS: 300.0000 mg | ORAL_CAPSULE | Freq: Once | ORAL | Status: AC

## 2021-03-12 MED ORDER — TRANEXAMIC ACID-NACL 1000-0.7 MG/100ML-% IV SOLN
INTRAVENOUS | Status: DC | PRN
  Administered 2021-03-12: 1000 mg via INTRAVENOUS

## 2021-03-12 MED ORDER — ONDANSETRON HCL 4 MG PO TABS: 4.0000 mg | ORAL_TABLET | Freq: Four times a day (QID) | ORAL | Status: DC | PRN

## 2021-03-12 MED ORDER — ONDANSETRON HCL 4 MG/2ML IJ SOLN
INTRAMUSCULAR | Status: AC
  Administered 2021-03-12: 4 mg via INTRAVENOUS
  Filled 2021-03-12: qty 2

## 2021-03-12 MED ORDER — LOSARTAN POTASSIUM 50 MG PO TABS: 100.0000 mg | ORAL_TABLET | Freq: Every day | ORAL | Status: DC

## 2021-03-12 MED ORDER — BISACODYL 5 MG PO TBEC: 5.0000 mg | DELAYED_RELEASE_TABLET | Freq: Every day | ORAL | Status: DC | PRN

## 2021-03-12 MED ORDER — SPIRONOLACTONE 25 MG PO TABS: 100.0000 mg | ORAL_TABLET | Freq: Every day | ORAL | Status: DC

## 2021-03-12 MED ORDER — DOCUSATE SODIUM 100 MG PO CAPS
100.0000 mg | ORAL_CAPSULE | Freq: Two times a day (BID) | ORAL | Status: DC
  Administered 2021-03-12: 100 mg via ORAL
  Filled 2021-03-12: qty 1

## 2021-03-12 MED ORDER — DOCUSATE SODIUM 100 MG PO CAPS
100.0000 mg | ORAL_CAPSULE | Freq: Two times a day (BID) | ORAL | Status: DC
  Administered 2021-03-12 – 2021-03-17 (×10): 100 mg via ORAL
  Filled 2021-03-12 (×9): qty 1

## 2021-03-12 MED ORDER — ROPIVACAINE HCL 5 MG/ML IJ SOLN
INTRAMUSCULAR | Status: DC | PRN
  Administered 2021-03-12: 25 mL via PERINEURAL
  Administered 2021-03-12: 15 mL via PERINEURAL

## 2021-03-12 MED ORDER — LORATADINE 10 MG PO TABS
10.0000 mg | ORAL_TABLET | Freq: Every day | ORAL | Status: DC
  Administered 2021-03-13 – 2021-03-15 (×3): 10 mg via ORAL
  Filled 2021-03-12 (×3): qty 1

## 2021-03-12 MED ORDER — ALBUMIN HUMAN 5 % IV SOLN: INTRAVENOUS | Status: DC | PRN

## 2021-03-12 MED ORDER — CHLORHEXIDINE GLUCONATE 0.12 % MT SOLN
OROMUCOSAL | Status: AC
  Administered 2021-03-12: 15 mL via OROMUCOSAL
  Filled 2021-03-12: qty 15

## 2021-03-12 MED ORDER — PHENYLEPHRINE HCL-NACL 20-0.9 MG/250ML-% IV SOLN
INTRAVENOUS | Status: DC | PRN
  Administered 2021-03-12: 50 ug/min via INTRAVENOUS

## 2021-03-12 MED ORDER — MIDAZOLAM HCL 2 MG/2ML IJ SOLN
INTRAMUSCULAR | Status: AC
  Filled 2021-03-12: qty 2

## 2021-03-12 MED ORDER — SODIUM CHLORIDE 0.9 % IV SOLN: INTRAVENOUS | Status: DC

## 2021-03-12 MED ORDER — SODIUM ZIRCONIUM CYCLOSILICATE 5 G PO PACK
5.0000 g | PACK | Freq: Once | ORAL | Status: AC
  Administered 2021-03-12: 5 g via ORAL
  Filled 2021-03-12: qty 1

## 2021-03-12 MED ORDER — OXYCODONE HCL ER 10 MG PO T12A
10.0000 mg | EXTENDED_RELEASE_TABLET | Freq: Two times a day (BID) | ORAL | Status: DC
  Administered 2021-03-12 – 2021-03-13 (×3): 10 mg via ORAL
  Filled 2021-03-12 (×3): qty 1

## 2021-03-12 MED ORDER — INSULIN ASPART 100 UNIT/ML IJ SOLN
3.0000 [IU] | Freq: Three times a day (TID) | INTRAMUSCULAR | Status: DC
  Administered 2021-03-13: 3 [IU] via SUBCUTANEOUS

## 2021-03-12 MED ORDER — HYDROMORPHONE HCL 1 MG/ML IJ SOLN
INTRAMUSCULAR | Status: AC
  Administered 2021-03-12: 1 mg via INTRAVENOUS
  Filled 2021-03-12: qty 1

## 2021-03-12 MED ORDER — ONDANSETRON HCL 4 MG/2ML IJ SOLN
INTRAMUSCULAR | Status: DC | PRN
  Administered 2021-03-12: 4 mg via INTRAVENOUS

## 2021-03-12 MED ORDER — FLEET ENEMA 7-19 GM/118ML RE ENEM: 1.0000 | ENEMA | Freq: Once | RECTAL | Status: DC | PRN

## 2021-03-12 MED ORDER — BUPIVACAINE HCL (PF) 0.5 % IJ SOLN
INTRAMUSCULAR | Status: DC | PRN
  Administered 2021-03-12: 7 mL
  Administered 2021-03-12: 10 mL

## 2021-03-12 MED ORDER — INSULIN ASPART 100 UNIT/ML IJ SOLN
0.0000 [IU] | Freq: Three times a day (TID) | INTRAMUSCULAR | Status: DC
  Administered 2021-03-13: 8 [IU] via SUBCUTANEOUS
  Administered 2021-03-13: 2 [IU] via SUBCUTANEOUS
  Administered 2021-03-13: 5 [IU] via SUBCUTANEOUS
  Administered 2021-03-14 (×2): 2 [IU] via SUBCUTANEOUS

## 2021-03-12 MED ORDER — METOCLOPRAMIDE HCL 5 MG/ML IJ SOLN: 5.0000 mg | Freq: Three times a day (TID) | INTRAMUSCULAR | Status: DC | PRN

## 2021-03-12 MED ORDER — CEFAZOLIN SODIUM-DEXTROSE 2-4 GM/100ML-% IV SOLN
2.0000 g | INTRAVENOUS | Status: AC
  Administered 2021-03-12: 2 g via INTRAVENOUS

## 2021-03-12 MED ORDER — PREGABALIN 25 MG PO CAPS
75.0000 mg | ORAL_CAPSULE | Freq: Two times a day (BID) | ORAL | Status: DC
  Administered 2021-03-12: 75 mg via ORAL
  Filled 2021-03-12: qty 3

## 2021-03-12 MED ORDER — ALPRAZOLAM 0.25 MG PO TABS: 0.2500 mg | ORAL_TABLET | Freq: Every evening | ORAL | Status: DC | PRN

## 2021-03-12 MED ORDER — HYDROMORPHONE HCL 1 MG/ML IJ SOLN: 0.5000 mg | INTRAMUSCULAR | Status: DC | PRN

## 2021-03-12 MED ORDER — LORATADINE 10 MG PO TABS
10.0000 mg | ORAL_TABLET | Freq: Every day | ORAL | Status: DC
  Administered 2021-03-12: 10 mg via ORAL
  Filled 2021-03-12: qty 1

## 2021-03-12 MED ORDER — BUPIVACAINE HCL 0.5 % IJ SOLN
INTRAMUSCULAR | Status: AC
  Filled 2021-03-12: qty 1

## 2021-03-12 MED ORDER — TRANEXAMIC ACID-NACL 1000-0.7 MG/100ML-% IV SOLN
1000.0000 mg | Freq: Once | INTRAVENOUS | Status: AC
  Administered 2021-03-12: 1000 mg via INTRAVENOUS
  Filled 2021-03-12: qty 100

## 2021-03-12 MED ORDER — INSULIN GLARGINE-YFGN 100 UNIT/ML ~~LOC~~ SOLN
25.0000 [IU] | Freq: Every day | SUBCUTANEOUS | Status: DC
  Administered 2021-03-12 – 2021-03-16 (×5): 25 [IU] via SUBCUTANEOUS
  Filled 2021-03-12 (×6): qty 0.25

## 2021-03-12 MED ORDER — ONDANSETRON HCL 4 MG/2ML IJ SOLN: 4.0000 mg | Freq: Once | INTRAMUSCULAR | Status: AC

## 2021-03-12 MED ORDER — ONDANSETRON HCL 4 MG/2ML IJ SOLN: 4.0000 mg | Freq: Four times a day (QID) | INTRAMUSCULAR | Status: DC | PRN

## 2021-03-12 MED ORDER — FENTANYL CITRATE (PF) 100 MCG/2ML IJ SOLN
INTRAMUSCULAR | Status: AC
  Filled 2021-03-12: qty 2

## 2021-03-12 MED ORDER — DEXAMETHASONE SODIUM PHOSPHATE 4 MG/ML IJ SOLN
INTRAMUSCULAR | Status: DC | PRN
  Administered 2021-03-12: 2 mg via PERINEURAL
  Administered 2021-03-12: 3 mg via PERINEURAL

## 2021-03-12 MED ORDER — POLYETHYLENE GLYCOL 3350 17 G PO PACK
17.0000 g | PACK | Freq: Every day | ORAL | Status: DC | PRN
  Filled 2021-03-12: qty 1

## 2021-03-12 MED ORDER — LIDOCAINE 2% (20 MG/ML) 5 ML SYRINGE
INTRAMUSCULAR | Status: DC | PRN
  Administered 2021-03-12: 60 mg via INTRAVENOUS

## 2021-03-12 MED ORDER — PHENYLEPHRINE 40 MCG/ML (10ML) SYRINGE FOR IV PUSH (FOR BLOOD PRESSURE SUPPORT)
PREFILLED_SYRINGE | INTRAVENOUS | Status: DC | PRN
  Administered 2021-03-12: 160 ug via INTRAVENOUS
  Administered 2021-03-12 (×2): 120 ug via INTRAVENOUS
  Administered 2021-03-12 (×2): 160 ug via INTRAVENOUS

## 2021-03-12 MED ORDER — POLYETHYLENE GLYCOL 3350 17 G PO PACK: 17.0000 g | PACK | Freq: Every day | ORAL | Status: DC | PRN

## 2021-03-12 MED ORDER — PANTOPRAZOLE SODIUM 40 MG PO TBEC
40.0000 mg | DELAYED_RELEASE_TABLET | Freq: Two times a day (BID) | ORAL | Status: DC
  Administered 2021-03-12 – 2021-03-17 (×10): 40 mg via ORAL
  Filled 2021-03-12 (×9): qty 1

## 2021-03-12 MED ORDER — HYDROMORPHONE HCL 1 MG/ML IJ SOLN
1.0000 mg | INTRAMUSCULAR | Status: DC | PRN
  Administered 2021-03-12: 1 mg via INTRAVENOUS
  Filled 2021-03-12: qty 1

## 2021-03-12 MED ORDER — DIPHENHYDRAMINE HCL 12.5 MG/5ML PO ELIX: 12.5000 mg | ORAL_SOLUTION | ORAL | Status: DC | PRN

## 2021-03-12 MED ORDER — HYDROCODONE-ACETAMINOPHEN 5-325 MG PO TABS: 1.0000 | ORAL_TABLET | Freq: Four times a day (QID) | ORAL | Status: DC | PRN

## 2021-03-12 MED ORDER — MORPHINE SULFATE (PF) 2 MG/ML IV SOLN
0.5000 mg | INTRAVENOUS | Status: DC | PRN
  Administered 2021-03-12 (×2): 0.5 mg via INTRAVENOUS
  Filled 2021-03-12 (×2): qty 1

## 2021-03-12 MED ORDER — MEPERIDINE HCL 25 MG/ML IJ SOLN: 6.2500 mg | INTRAMUSCULAR | Status: DC | PRN

## 2021-03-12 MED ORDER — METOPROLOL TARTRATE 50 MG PO TABS
50.0000 mg | ORAL_TABLET | Freq: Two times a day (BID) | ORAL | Status: DC
  Administered 2021-03-12 – 2021-03-14 (×4): 50 mg via ORAL
  Filled 2021-03-12: qty 2
  Filled 2021-03-12 (×3): qty 1

## 2021-03-12 MED ORDER — CEFAZOLIN SODIUM-DEXTROSE 1-4 GM/50ML-% IV SOLN
1.0000 g | Freq: Four times a day (QID) | INTRAVENOUS | Status: AC
  Administered 2021-03-12 – 2021-03-13 (×3): 1 g via INTRAVENOUS
  Filled 2021-03-12 (×3): qty 50

## 2021-03-12 MED ORDER — BUPIVACAINE-EPINEPHRINE 0.5% -1:200000 IJ SOLN
INTRAMUSCULAR | Status: AC
  Filled 2021-03-12: qty 1

## 2021-03-12 MED ORDER — TRANEXAMIC ACID-NACL 1000-0.7 MG/100ML-% IV SOLN
INTRAVENOUS | Status: AC
  Filled 2021-03-12: qty 100

## 2021-03-12 MED ORDER — INSULIN ASPART 100 UNIT/ML IJ SOLN
8.0000 [IU] | Freq: Once | INTRAMUSCULAR | Status: AC
  Administered 2021-03-12: 8 [IU] via SUBCUTANEOUS

## 2021-03-12 MED ORDER — SPIRONOLACTONE 100 MG PO TABS
100.0000 mg | ORAL_TABLET | Freq: Every day | ORAL | Status: DC
  Administered 2021-03-12: 100 mg via ORAL
  Filled 2021-03-12: qty 1
  Filled 2021-03-12: qty 4

## 2021-03-12 MED ORDER — LOSARTAN POTASSIUM 50 MG PO TABS
100.0000 mg | ORAL_TABLET | Freq: Every day | ORAL | Status: DC
  Administered 2021-03-12: 100 mg via ORAL
  Filled 2021-03-12: qty 2

## 2021-03-12 MED ORDER — CLONIDINE HCL (ANALGESIA) 100 MCG/ML EP SOLN
EPIDURAL | Status: DC | PRN
  Administered 2021-03-12: 50 ug
  Administered 2021-03-12: 30 ug

## 2021-03-12 MED ORDER — ADULT MULTIVITAMIN W/MINERALS CH
1.0000 | ORAL_TABLET | Freq: Every day | ORAL | Status: DC
  Administered 2021-03-13 – 2021-03-17 (×5): 1 via ORAL
  Filled 2021-03-12 (×5): qty 1

## 2021-03-12 MED ORDER — POVIDONE-IODINE 10 % EX SWAB
2.0000 "application " | Freq: Once | CUTANEOUS | Status: AC
  Administered 2021-03-12: 2 via TOPICAL

## 2021-03-12 MED ORDER — INSULIN ASPART 100 UNIT/ML IJ SOLN: 0.0000 [IU] | Freq: Every day | INTRAMUSCULAR | Status: DC

## 2021-03-12 MED ORDER — GABAPENTIN 300 MG PO CAPS
ORAL_CAPSULE | ORAL | Status: AC
  Administered 2021-03-12: 300 mg via ORAL
  Filled 2021-03-12: qty 1

## 2021-03-12 MED ORDER — ACETAMINOPHEN 325 MG PO TABS
325.0000 mg | ORAL_TABLET | Freq: Four times a day (QID) | ORAL | Status: DC | PRN
  Administered 2021-03-15: 650 mg via ORAL
  Administered 2021-03-16 (×2): 325 mg via ORAL
  Filled 2021-03-12: qty 2
  Filled 2021-03-12 (×2): qty 1

## 2021-03-12 MED ORDER — OXYCODONE HCL 5 MG PO TABS
5.0000 mg | ORAL_TABLET | ORAL | Status: DC | PRN
  Administered 2021-03-13 – 2021-03-15 (×5): 10 mg via ORAL
  Administered 2021-03-15: 5 mg via ORAL
  Administered 2021-03-16: 10 mg via ORAL
  Filled 2021-03-12 (×4): qty 2
  Filled 2021-03-12: qty 1
  Filled 2021-03-12 (×2): qty 2

## 2021-03-12 MED ORDER — PHENYLEPHRINE HCL (PRESSORS) 10 MG/ML IV SOLN
INTRAVENOUS | Status: DC | PRN
  Administered 2021-03-12: 80 ug via INTRAVENOUS

## 2021-03-12 MED ORDER — EPHEDRINE SULFATE 50 MG/ML IJ SOLN
INTRAMUSCULAR | Status: DC | PRN
  Administered 2021-03-12 (×2): 5 mg via INTRAVENOUS

## 2021-03-12 MED ORDER — PANTOPRAZOLE SODIUM 40 MG PO TBEC
40.0000 mg | DELAYED_RELEASE_TABLET | Freq: Two times a day (BID) | ORAL | Status: DC
  Administered 2021-03-12: 40 mg via ORAL
  Filled 2021-03-12: qty 1

## 2021-03-12 MED ORDER — HYDROMORPHONE HCL 1 MG/ML IJ SOLN
0.2500 mg | INTRAMUSCULAR | Status: DC | PRN
  Administered 2021-03-12 (×2): 0.5 mg via INTRAVENOUS

## 2021-03-12 MED ORDER — FENTANYL CITRATE (PF) 100 MCG/2ML IJ SOLN
50.0000 ug | Freq: Once | INTRAMUSCULAR | Status: AC
  Administered 2021-03-12: 50 ug via INTRAVENOUS

## 2021-03-12 MED ORDER — PREGABALIN 75 MG PO CAPS
75.0000 mg | ORAL_CAPSULE | Freq: Two times a day (BID) | ORAL | Status: DC
  Administered 2021-03-12 – 2021-03-17 (×10): 75 mg via ORAL
  Filled 2021-03-12 (×10): qty 1

## 2021-03-12 MED ORDER — INSULIN ASPART 100 UNIT/ML IJ SOLN
0.0000 [IU] | Freq: Three times a day (TID) | INTRAMUSCULAR | Status: DC
Start: 2021-03-12 — End: 2021-03-12

## 2021-03-12 MED ORDER — METOCLOPRAMIDE HCL 5 MG PO TABS: 5.0000 mg | ORAL_TABLET | Freq: Three times a day (TID) | ORAL | Status: DC | PRN

## 2021-03-12 MED ORDER — PHENYLEPHRINE 40 MCG/ML (10ML) SYRINGE FOR IV PUSH (FOR BLOOD PRESSURE SUPPORT)
PREFILLED_SYRINGE | INTRAVENOUS | Status: AC
  Filled 2021-03-12: qty 10

## 2021-03-12 MED ORDER — CHLORHEXIDINE GLUCONATE 0.12 % MT SOLN: 15.0000 mL | Freq: Once | OROMUCOSAL | Status: AC

## 2021-03-12 MED ORDER — PROPOFOL 10 MG/ML IV BOLUS
INTRAVENOUS | Status: AC
  Filled 2021-03-12: qty 20

## 2021-03-12 MED ORDER — FERROUS SULFATE 325 (65 FE) MG PO TABS
325.0000 mg | ORAL_TABLET | Freq: Two times a day (BID) | ORAL | Status: DC
  Administered 2021-03-13 – 2021-03-17 (×9): 325 mg via ORAL
  Filled 2021-03-12 (×9): qty 1

## 2021-03-12 MED ORDER — FENTANYL CITRATE (PF) 250 MCG/5ML IJ SOLN
INTRAMUSCULAR | Status: AC
  Filled 2021-03-12: qty 5

## 2021-03-12 SURGICAL SUPPLY — 76 items
BAG COUNTER SPONGE SURGICOUNT (BAG) ×2 IMPLANT
BANDAGE ESMARK 6X9 LF (GAUZE/BANDAGES/DRESSINGS) ×1 IMPLANT
BIT DRILL CALIBRATED 4.3X320MM (BIT) ×1 IMPLANT
BIT DRILL CROWE POINT TWST 4.3 (DRILL) ×1 IMPLANT
BLADE CLIPPER SURG (BLADE) IMPLANT
BLADE SURG 15 STRL LF DISP TIS (BLADE) ×1 IMPLANT
BLADE SURG 15 STRL SS (BLADE) ×1
BNDG COHESIVE 6X5 TAN STRL LF (GAUZE/BANDAGES/DRESSINGS) ×2 IMPLANT
BNDG ELASTIC 4X5.8 VLCR STR LF (GAUZE/BANDAGES/DRESSINGS) ×4 IMPLANT
BNDG ELASTIC 6X10 VLCR STRL LF (GAUZE/BANDAGES/DRESSINGS) ×4 IMPLANT
BNDG ELASTIC 6X5.8 VLCR STR LF (GAUZE/BANDAGES/DRESSINGS) ×2 IMPLANT
BNDG ESMARK 6X9 LF (GAUZE/BANDAGES/DRESSINGS) ×2
BNDG GAUZE ELAST 4 BULKY (GAUZE/BANDAGES/DRESSINGS) ×2 IMPLANT
COVER SURGICAL LIGHT HANDLE (MISCELLANEOUS) ×4 IMPLANT
CUFF TOURN SGL QUICK 24 (TOURNIQUET CUFF) ×1
CUFF TOURN SGL QUICK 34 (TOURNIQUET CUFF)
CUFF TOURN SGL QUICK 42 (TOURNIQUET CUFF) IMPLANT
CUFF TRNQT CYL 24X4X40X1 (TOURNIQUET CUFF) ×1 IMPLANT
CUFF TRNQT CYL 34X4.125X (TOURNIQUET CUFF) IMPLANT
DRAPE C-ARM 42X72 X-RAY (DRAPES) ×2 IMPLANT
DRAPE HALF SHEET 40X57 (DRAPES) ×4 IMPLANT
DRAPE IMP U-DRAPE 54X76 (DRAPES) ×2 IMPLANT
DRAPE ORTHO SPLIT 77X108 STRL (DRAPES) ×2
DRAPE SURG ORHT 6 SPLT 77X108 (DRAPES) ×2 IMPLANT
DRAPE U-SHAPE 47X51 STRL (DRAPES) ×2 IMPLANT
DRESSING MEPILEX FLEX 4X4 (GAUZE/BANDAGES/DRESSINGS) ×3 IMPLANT
DRILL CALIBRATED 4.3X320MM (BIT) ×2
DRILL CROWE POINT TWIST 4.3 (DRILL) ×2
DRSG ADAPTIC 3X8 NADH LF (GAUZE/BANDAGES/DRESSINGS) ×2 IMPLANT
DRSG MEPILEX BORDER 4X8 (GAUZE/BANDAGES/DRESSINGS) IMPLANT
DRSG MEPILEX FLEX 4X4 (GAUZE/BANDAGES/DRESSINGS) ×6
DRSG PAD ABDOMINAL 8X10 ST (GAUZE/BANDAGES/DRESSINGS) ×12 IMPLANT
DURAPREP 26ML APPLICATOR (WOUND CARE) ×2 IMPLANT
ELECT REM PT RETURN 9FT ADLT (ELECTROSURGICAL) ×2
ELECTRODE REM PT RTRN 9FT ADLT (ELECTROSURGICAL) ×1 IMPLANT
GAUZE SPONGE 4X4 12PLY STRL (GAUZE/BANDAGES/DRESSINGS) ×4 IMPLANT
GAUZE SPONGE 4X4 12PLY STRL LF (GAUZE/BANDAGES/DRESSINGS) ×2 IMPLANT
GLOVE SRG 8 PF TXTR STRL LF DI (GLOVE) ×1 IMPLANT
GLOVE SURG 8.5 LATEX PF (GLOVE) ×2 IMPLANT
GLOVE SURG LTX SZ9 (GLOVE) ×2 IMPLANT
GLOVE SURG ORTHO LTX SZ7.5 (GLOVE) ×2 IMPLANT
GLOVE SURG UNDER POLY LF SZ8 (GLOVE) ×1
GOWN STRL REUS W/ TWL LRG LVL3 (GOWN DISPOSABLE) ×1 IMPLANT
GOWN STRL REUS W/TWL 2XL LVL3 (GOWN DISPOSABLE) ×4 IMPLANT
GOWN STRL REUS W/TWL LRG LVL3 (GOWN DISPOSABLE) ×1
GUIDEPIN VERSANAIL DSP 3.2X444 (ORTHOPEDIC DISPOSABLE SUPPLIES) ×2 IMPLANT
GUIDEWIRE 2.6X80 BEAD TIP (WIRE) ×1 IMPLANT
GUIDWIRE 2.6X80 BEAD TIP (WIRE) ×2
KIT BASIN OR (CUSTOM PROCEDURE TRAY) ×2 IMPLANT
KIT TURNOVER KIT B (KITS) ×2 IMPLANT
MANIFOLD NEPTUNE II (INSTRUMENTS) ×2 IMPLANT
NAIL TIBIAL PHOENIX 9.0X330MM (Nail) ×2 IMPLANT
NEEDLE 22X1 1/2 (OR ONLY) (NEEDLE) ×2 IMPLANT
NS IRRIG 1000ML POUR BTL (IV SOLUTION) ×2 IMPLANT
PACK GENERAL/GYN (CUSTOM PROCEDURE TRAY) ×2 IMPLANT
PACK UNIVERSAL I (CUSTOM PROCEDURE TRAY) ×2 IMPLANT
PAD ARMBOARD 7.5X6 YLW CONV (MISCELLANEOUS) ×4 IMPLANT
PENCIL BUTTON HOLSTER BLD 10FT (ELECTRODE) ×2 IMPLANT
SCREW CORT TI DBL LEAD 5X32 (Screw) ×2 IMPLANT
SCREW CORT TI DBL LEAD 5X34 (Screw) ×2 IMPLANT
SCREW CORT TI DBL LEAD 5X40 (Screw) ×2 IMPLANT
SCREW CORT TI DBL LEAD 5X42 (Screw) ×2 IMPLANT
SCREW CORT TI DBL LEAD 5X46 (Screw) ×2 IMPLANT
SCREW CORT TI DBL LEAD 5X50 (Screw) ×2 IMPLANT
SPLINT FIBERGLASS 4X30 (CAST SUPPLIES) ×2 IMPLANT
STAPLER VISISTAT (STAPLE) ×2 IMPLANT
STAPLER VISISTAT 35W (STAPLE) ×2 IMPLANT
STOCKINETTE IMPERVIOUS LG (DRAPES) IMPLANT
SUT VIC AB 0 CT1 27 (SUTURE) ×2
SUT VIC AB 0 CT1 27XBRD ANBCTR (SUTURE) ×2 IMPLANT
SUT VIC AB 2-0 CT1 27 (SUTURE) ×2
SUT VIC AB 2-0 CT1 TAPERPNT 27 (SUTURE) ×2 IMPLANT
SYR CONTROL 10ML LL (SYRINGE) ×2 IMPLANT
TOWEL GREEN STERILE (TOWEL DISPOSABLE) ×2 IMPLANT
TOWEL GREEN STERILE FF (TOWEL DISPOSABLE) ×2 IMPLANT
WATER STERILE IRR 1000ML POUR (IV SOLUTION) ×2 IMPLANT

## 2021-03-12 NOTE — Transfer of Care (Signed)
Immediate Anesthesia Transfer of Care Note  Patient: Mallory Stewart  Procedure(s) Performed: INTRAMEDULLARY (IM) NAIL TIBIAL (Left: Leg Lower)  Patient Location: PACU  Anesthesia Type:General  Level of Consciousness: awake and alert   Airway & Oxygen Therapy: Patient Spontanous Breathing and Patient connected to nasal cannula oxygen  Post-op Assessment: Report given to RN and Post -op Vital signs reviewed and stable  Post vital signs: Reviewed and stable  Last Vitals:  Vitals Value Taken Time  BP 121/90 03/12/21 1740  Temp    Pulse 94 03/12/21 1741  Resp 19 03/12/21 1741  SpO2 87 % 03/12/21 1741  Vitals shown include unvalidated device data.  Last Pain:  Vitals:   03/12/21 1231  TempSrc:   PainSc: 8          Complications: No notable events documented.

## 2021-03-12 NOTE — Op Note (Addendum)
03/12/2021  9:09 PM  PATIENT:  Mallory Stewart  60 y.o. female  MRN: [id number]  OPERATIVE REPORT  PRE-OPERATIVE DIAGNOSIS:  Left Tib/Fib fracture  POST-OPERATIVE DIAGNOSIS:  Left Tib/Fib fracture  PROCEDURE:  Procedure(s): INTRAMEDULLARY (IM) NAIL TIBIAL    SURGEON:  Jessy Oto, MD     ASSISTANT: None    ANESTHESIA:  General, with local inflitration with marcaine 0.5% total 20cc Dr. Lissa Hoard.    COMPLICATIONS:  None.   EBL: 150CC  TOTAL TOURNIQUET TIME: 52 min @ 280 mm Hg    COMPONENTS:   Implant Name Type Inv. Item Serial No. Manufacturer Lot No. LRB No. Used Action  NAIL TIBIAL [place] 9.0X330MM - [id number] Nail NAIL TIBIAL [place] 9.0X330MM  [employer](ORTH,TRAU,BIO,SG) [id number] Left 1 Implanted  SCREW CORT TI DBL LEAD 5X50 - [id number] Screw SCREW CORT TI DBL LEAD 5X50  [employer](ORTH,TRAU,BIO,SG) [id number] Left 1 Implanted and Explanted  SCREW CORT TI DBL LEAD 5X46 - [id number] Screw SCREW CORT TI DBL LEAD 5X46  [employer](ORTH,TRAU,BIO,SG) [id number] Left 1 Implanted  SCREW CORT TI DBL LEAD 5X40 - [id number] Screw SCREW CORT TI DBL LEAD 5X40  [employer](ORTH,TRAU,BIO,SG) [id number] Left 1 Implanted  SCREW CORT TI DBL LEAD 5X42 - [id number] Screw SCREW CORT TI DBL LEAD 5X42  [employer](ORTH,TRAU,BIO,SG) [id number] Left 1 Implanted  SCREW CORT TI DBL LEAD 5X34 - [id number] Screw SCREW CORT TI DBL LEAD 5X34  [employer](ORTH,TRAU,BIO,SG) [id number] Left 1 Implanted  SCREW CORT TI DBL LEAD 5X32 - [id number] Screw SCREW CORT TI DBL LEAD 5X32  [employer](ORTH,TRAU,BIO,SG) [id number] Left 1 Implanted    PROCEDURE: The patient was met in the holding area, and the appropriate left leg identified and marked with an "X" and my initials. The patient was then transported to OR and was placed under general anesthesia in supine position on OR  radioptc table. The patient received appropriate preoperative antibiotic prophylaxis 2 grams ancef. The left foot and ankle  left lower extremity to the  mid thigh was then prepped circumferentially about the left knee thigh using DuraPrep solution. Left upper thigh tourniquet. Time out protocol was done. The left leg draped in the usual fashion. The left leg elevated and esmarch bandage used to exsanginate the leg and the tourniquet inflated to 280 mm Hg. The skin incision approximately 3-1/2 inch incision was used. Incision in line with the medial border of the patella ligament from the anterior tibial tubercle to the medial distal 1/3 of the patella the fat pad anterior to the proximal tibia was incised in the mid line deep to the patella ligament. Using C-arm fluoroscopy a cannulated awl was then carefully guided into the proximal tibial medullary canal from the anterior lip of the mid tibial plateau. Guide pin for the [facility]  system was then placed through the cannulated awl into the femoral channel and passed down to the distal tibial fracture site and then to the distal physeal scar. The length of the nail measured for length a 330 mm length nail was chosen using the length guide passed over the guide pin to the distal edge of the entry point into the intermedullary canal.The distal tip of the guide wire was centered into the central portion of the distal tibial fracture fragment. The soft tissue sleeve protector used as the reamer then used to ream the length of the intramedullary canal. This was reamed distal to the fracture site to the distal tibial physeal scar. An 11 mm reamer was then passed past the length  of the intramedullary canal allowing for 2 millimeter width tolerance for the 9 mm x 330 mm [place] nail. Attention was then turned to the distal interlocking screw placement. Permanent C-arm images were obtained in AP lateral planes the fracture site. C-arm then brought through a direct lateral to the distal tibial IM nail locking holes. On C-arm the holes were brought to the fullest extent indicating the C-arm image was parallel to the holes.  Tonsil clamp used to identify the skin directly deep to the distal interlocking screw holes and marks made on the skin. 10 blade scalpel used to make stab incisions in the skin and hemostat used to spread of the skin and subcutaneous layers down to bone. The 4.5 mm drill was then carefully applied to the medial aspect of the distal tibia and proximal interlocks screw hole and centered on the screw hole seen on fluoroscopy. Freehand drilling was then performed of the proximal screw hole through the superficial tibial cortex laterally through the nail then the deeper cortex leaving the drill bit in place and observing on C-arm fluoroscopy the drill position through the nail and its direction. Depth gauge then measured the distance between the medial and lateral cortex of the distal tibial at this hole site. Appropriate screw was then placed through the proximal of rhe distal interlocking screw observed to be engaging the nail and the opposite cortex. Same sequences then used to drill a hole through the distal of the 3 distal interlock screw holes again positioning the 4.5 drill bit against the superficial cortex observing on C-arm fluoroscopy this to be within the center of the distal eccentric interlocking hole of the nail. Then drilling free and in the correct direction observing on C-arm fluoroscopy the nail opening filled with the drill bit as this hole was prepared. Measuring depth and placing appropriate screw. Permanent images were then obtained AP and lateral planes lateral planes demonstrating the screws in good position alignment A single proximal of the two distal interlocking screws was exchanged to a shorter length by 4 mm.This provided excellent fixation of the distal nail. The nail was properly aligned the holes for the proximal lag screws directed transversely below the anterior tibial tubercle. First the Using the insertion handle guide sleeves were then passed for the small lag screw down to the  skin and a stab incision made using a 10 blade scalpel through skin and subcutaneous layers Reading the subcutaneous layers down to bone using tonsil clamp. Sleeves were then passed down to bone with the centering sleeve for the drill for the lag screw down to bone. The 46 and 47mm interocking screws were chosen and these were placed with excellent purchase. An anterior to posterior 34 mm screw was chosen and this was placed under direct C-arm fluoroscopy this provided for a drilling and then placing the AP screw. The insertion handle sighting feature for the coronal interlocking screws. These were first driled and then measured for length and the to screws placed in the static position. The proximal locking mechanism for the proximal static interlocking screw was then tightened using the hex driver. This was tightened completely.  With this the insertion handle for IM nail and the proximal interlocking screws was then removed with the sleeve and the insertion handle. Permanent images were then obtained AP and lateral planes lateral planes demonstrating the nail in good position alignment AP however showed the screw slightly long so that they were backed off turned and had to even out any overlying  both lateral and medial. His provided excellent fixation of the distal nail. Irrigation was then carried out of all the incision sites after obtaining permanent C-arm images in AP and lateral planes. Tourniquet was released. The medial parapatella incision irrigated with copious irrigant solution. This was then closed in layers approximating the anterior fat pad with 0 vicryl sutures. The medial patella ligament and retinaculm approximated with 0 vicryl sutures the subcutaneous layers with 2-0 vicryl and skin with small stainless steel staples. The proximal medial stab incision closed with 2-0 vicryl subcutaneous sutures then SS staples. The 2 stab incisions distally and anterior distal incision were then closed with  subcutaneous sutures of 2-0 Vicryl then stainless steel staples.  Mepilex small bandages 3 in number were then applied to the anterior and medial proximal and the distal medial and distal anterior areas. A well padded posterior synthetic splint applied using ABDS, kerlix and ACE wraps. All instrument and sponge counts were correct. Patient was then reactivated extubated and returned to recovery room in satisfactory condition.   Basil Dess 03/12/2021, 9:09 PM

## 2021-03-12 NOTE — Progress Notes (Signed)
Orthopedic Tech Progress Note Patient Details:  Mallory Stewart 08-28-1960 [id number]  Ortho Devices Type of Ortho Device: Post (short leg) splint Ortho Device/Splint Location: Applied at drs request.lle Ortho Device/Splint Interventions: Ordered, Application   Post Interventions Patient Tolerated: Well Instructions Provided: Care of device, Adjustment of device  Trinna Post 03/12/2021, 2:07 AM

## 2021-03-12 NOTE — Brief Op Note (Signed)
03/12/2021  5:39 PM  PATIENT:  Mallory Stewart  60 y.o. female  PRE-OPERATIVE DIAGNOSIS:  Left Tib/Fib fracture  POST-OPERATIVE DIAGNOSIS:  Left Tib/Fib fracture  PROCEDURE:  Procedure(s): INTRAMEDULLARY (IM) NAIL TIBIAL (Left)  SURGEON:  Surgeon(s) and Role:    * Kerrin Champagne, MD - Primary  PHYSICIAN ASSISTANT: None  ASSISTANTS: none   ANESTHESIA:   local and general, Dr Renold Don  EBL:150cc  BLOOD ADMINISTERED:Nonenone  DRAINS: none   LOCAL MEDICATIONS USED:  MARCAINE 0.5%  Amount: 20 ml  SPECIMEN:  No Specimen  DISPOSITION OF SPECIMEN:  N/A  COUNTS:  YES  TOURNIQUET:   Total Tourniquet Time Documented: Thigh (Left) - 52 minutes Total: Thigh (Left) - 52 minutes   DICTATION: .Reubin Milan Dictation  PLAN OF CARE: Admit to inpatient   PATIENT DISPOSITION:  PACU - hemodynamically stable.   Delay start of Pharmacological VTE agent (>24hrs) due to surgical blood loss or risk of bleeding: no

## 2021-03-12 NOTE — Consult Note (Addendum)
Medical Consultation   TUESDAY TERLECKI  GTX:[id number]  DOB: [birth year]  DOA: 03/12/2021  PCP: Excell Seltzer, MD    Requesting physician: Dr. Otelia Sergeant  Reason for consultation: Diabetes management, AKI, cirrhosis management   History of Present Illness: [name] is an 60 y.o. female with a history of cirrhosis, ascites, diabetes mellitus type 2, who was admitted early this morning after a fall at home resulting in a left displaced angulated distal tibia/fibula fracture that was repaired in the operating room today by Dr. Otelia Sergeant of orthopedic surgery.  At the time of admission patient had elevated potassium level of 5.5 and an acute kidney injury with elevated creatinine to 1.78 from a baseline of 0.96.  Sugar this morning was over 300.  She reports at home it has been running in the 100-140 range most of the time and she was surprised it was that high today. She is now in hospital room status post surgery to fix the fracture and pain is well controlled.  She does not remember what caused her to fall at home but it sounds like it was a mechanical fall when she walking in her house. History of alcohol abuse but states that she has been abstinent for the last year.  She denies tobacco or illicit drug use. She has not had any chest pain, palpitations, abdominal pain, nausea vomiting or diarrhea.  She does state that her abdomen feels more swollen than normal.  She denies any urinary frequency, hesitancy or dysuria.  She denies flank pain.  She denies any fever, chills, sore throat, cough, shortness of breath, visual change, slurred speech, drooping face, weakness in her extremities.  She does have pain in her left leg s/p surgery     Review of Systems:  ROS As per HPI otherwise 10 point review of systems negative.    Past Medical History: Past Medical History:  Diagnosis Date   Alcohol-induced chronic pancreatitis (HCC)    B12 deficiency    Concussion    DDD  (degenerative disc disease), cervical    Diabetes (HCC)    DKA, type 2 (HCC)    Gastric outlet obstruction 08/12/2018   Hypertension    Neuropathy    Pancreatic pseudocyst/cyst 05/06/2013   Seasonal allergies     Past Surgical History: Past Surgical History:  Procedure Laterality Date   ANKLE ARTHROSCOPY Right 12/20/2020   Procedure: RIGHT ANKLE ARTHROSCOPIC DEBRIDEMENT;  Surgeon: Nadara Mustard, MD;  Location: [facility];  Service: Orthopedics;  Laterality: Right;   BIOPSY  01/19/2019   Procedure: BIOPSY;  Surgeon: Meridee Score Netty Starring., MD;  Location: [facility] ENDOSCOPY;  Service: Gastroenterology;;   CHOLECYSTECTOMY N/A 07/28/2016   Procedure: LAPAROSCOPIC CHOLECYSTECTOMY WITH INTRAOPERATIVE CHOLANGIOGRAM;  Surgeon: Rodman Pickle, MD;  Location: [facility] OR;  Service: General;  Laterality: N/A;   ESOPHAGOGASTRODUODENOSCOPY (EGD) WITH PROPOFOL N/A 08/13/2018   Procedure: ESOPHAGOGASTRODUODENOSCOPY (EGD) WITH PROPOFOL;  Surgeon: Lemar Lofty., MD;  Location: [facility] ENDOSCOPY;  Service: Gastroenterology;  Laterality: N/A;   ESOPHAGOGASTRODUODENOSCOPY (EGD) WITH PROPOFOL N/A 01/19/2019   Procedure: ESOPHAGOGASTRODUODENOSCOPY (EGD) WITH PROPOFOL;  Surgeon: Meridee Score Netty Starring., MD;  Location: [facility] END[facility]COPY;  Service: Gastroenterology;  Laterality: N/A;   IR PARACENTESIS  08/08/2020   LEEP  1990's   ORIF FOOT FRACTURE  09/2008   L 5th metatarsal   REFRACTIVE SURGERY  2000   TONSILLECTOMY     UPPER ESOPHAGEAL ENDOSCOPIC ULTRASOUND (  EUS) N/A 08/13/2018   Procedure: UPPER ESOPHAGEAL ENDOSCOPIC ULTRASOUND (EUS);  Surgeon: Lemar Lofty., MD;  Location: [facility] ENDOSCOPY;  Service: Gastroenterology;  Laterality: N/A;   UPPER ESOPHAGEAL ENDOSCOPIC ULTRASOUND (EUS) N/A 01/19/2019   Procedure: UPPER ESOPHAGEAL ENDOSCOPIC ULTRASOUND (EUS);  Surgeon: Lemar Lofty., MD;  Location: [facility] ENDOSCOPY;  Service: Gastroenterology;  Laterality: N/A;     Allergies:   Allergies   Allergen Reactions   Gluten Meal Other (See Comments)    Stomach distress   Zocor [Simvastatin] Other (See Comments)    Elevated lfts?     Social History:  reports that she has never smoked. She has never used smokeless tobacco. She reports that she does not currently use alcohol after a past usage of about 7.0 standard drinks per week. She reports that she does not use drugs.   Family History: Family History  Problem Relation Age of Onset   Other Mother        tachycardia.Marland KitchenMarland Kitchen?afib   Stroke Father        after hernia suegery   Prostate cancer Father    Other Father        global transient amnesia, unclear source   Atrial fibrillation Sister    Healthy Brother    Healthy Brother    Coronary artery disease Paternal Grandmother    Heart attack Paternal Grandmother 73   Brain cancer Maternal Grandfather        ?   Cancer Paternal Grandfather        ?   Breast cancer Maternal Grandmother     Unacceptable: Noncontributory, unremarkable, or negative. Acceptable: Family history reviewed and not pertinent (If you reviewed it)   Physical Exam: Vitals:   03/12/21 1915 03/12/21 1920 03/12/21 1925 03/12/21 1948  BP: 99/74  100/72 106/80  Pulse: (!) 106 (!) 106 (!) 103 (!) 103  Resp: 17 16 20 18   Temp: 97.6 F (36.4 C)   98.4 F (36.9 C)  TempSrc:    Oral  SpO2: 93% 93% 92% 95%  Weight:      Height:        Constitutional: WDWN. Alert and awake, oriented x3, not in any acute distress. Eyes: PERLA, EOMI, irises appear normal, anicteric sclera,  ENMT: external ears and nose appear normal, Nares patent with no epistaxis.            Lips appears normal, oropharynx mucosa, tongue, posterior pharynx appear normal  Neck: neck appears normal, no masses, normal ROM, no JVD  CVS: S1-S2 clear, no murmur rubs or gallops  Respiratory:  clear to auscultation bilaterally, no wheezing, rales or rhonchi. Respiratory effort normal. No accessory muscle use.  Abdomen: soft nontender,  distended, normal bowel sounds,  no hernias  Musculoskeletal: : no cyanosis, clubbing. Left leg in splint and postop dressing.           Neuro: Cranial nerves II-XII intact, strength, sensation, reflexes Psych: judgement and insight appear normal, stable mood and affect, mental status Skin: no rashes or lesions or ulcers, no induration or nodules    Data reviewed:  I have personally reviewed following labs and imaging studies Labs:  CBC: Recent Labs  Lab 03/12/21 0228 03/12/21 0442  WBC 4.6 5.0  HGB 10.5* 10.0*  HCT 30.7* 30.0*  MCV 88.5 88.8  PLT 144* 147*    Basic Metabolic Panel: Recent Labs  Lab 03/12/21 0228 03/12/21 0442  NA 133* 132*  K 4.7 5.5*  CL 100 100  CO2 20* 20*  GLUCOSE  314* 323*  BUN 32* 32*  CREATININE 1.78* 1.74*  CALCIUM 8.5* 8.8*   GFR Estimated Creatinine Clearance: 36.8 mL/min (A) (by C-G formula based on SCr of 1.74 mg/dL (H)). Liver Function Tests: No results for input(s): AST, ALT, ALKPHOS, BILITOT, PROT, ALBUMIN in the last 168 hours. No results for input(s): LIPASE, AMYLASE in the last 168 hours. No results for input(s): AMMONIA in the last 168 hours. Coagulation profile No results for input(s): INR, PROTIME in the last 168 hours.  Cardiac Enzymes: No results for input(s): CKTOTAL, CKMB, CKMBINDEX, TROPONINI in the last 168 hours. BNP: Invalid input(s): POCBNP CBG: Recent Labs  Lab 03/12/21 0806 03/12/21 1242 03/12/21 1327 03/12/21 1446 03/12/21 1746  GLUCAP 269* 253* 216* 170* 96    Urinalysis    Component Value Date/Time   COLORURINE AMBER (A) 08/05/2020 1500   APPEARANCEUR CLOUDY (A) 08/05/2020 1500   APPEARANCEUR Hazy 04/07/2013 2105   LABSPEC 1.012 08/05/2020 1500   LABSPEC 1.020 04/07/2013 2105   PHURINE 5.0 08/05/2020 1500   GLUCOSEU NEGATIVE 08/05/2020 1500   GLUCOSEU 150 mg/dL 50/27/7412 8786   HGBUR SMALL (A) 08/05/2020 1500   HGBUR negative 11/03/2008 0822   BILIRUBINUR NEGATIVE 08/05/2020 1500    BILIRUBINUR Negative 04/07/2013 2105   KETONESUR 5 (A) 08/05/2020 1500   PROTEINUR NEGATIVE 08/05/2020 1500   UROBILINOGEN 0.2 11/03/2008 0822   NITRITE NEGATIVE 08/05/2020 1500   LEUKOCYTESUR SMALL (A) 08/05/2020 1500   LEUKOCYTESUR Negative 04/07/2013 2105     Microbiology Recent Results (from the past 240 hour(s))  Resp Panel by RT-PCR (Flu A&B, Covid)     Status: None   Collection Time: 03/12/21  1:09 AM  Result Value Ref Range Status   SARS Coronavirus 2 by RT PCR NEGATIVE NEGATIVE Final    Comment: (NOTE) SARS-CoV-2 target nucleic acids are NOT DETECTED.  The SARS-CoV-2 RNA is generally detectable in upper respiratory specimens during the acute phase of infection. The lowest concentration of SARS-CoV-2 viral copies this assay can detect is 138 copies/mL. A negative result does not preclude SARS-Cov-2 infection and should not be used as the sole basis for treatment or other patient management decisions. A negative result may occur with  improper specimen collection/handling, submission of specimen other than nasopharyngeal swab, presence of viral mutation(s) within the areas targeted by this assay, and inadequate number of viral copies(<138 copies/mL). A negative result must be combined with clinical observations, patient history, and epidemiological information. The expected result is Negative.  Fact Sheet for Patients:  BloggerCourse.com  Fact Sheet for Healthcare Providers:  SeriousBroker.it  This test is no t yet approved or cleared by the [place] FDA and  has been authorized for detection and/or diagnosis of SARS-CoV-2 by FDA under an Emergency Use Authorization (EUA). This EUA will remain  in effect (meaning this test can be used) for the duration of the COVID-19 declaration under Section 564(b)(1) of the Act, 21 U.S.C.section 360bbb-3(b)(1), unless the authorization is terminated  or revoked sooner.        Influenza A by PCR NEGATIVE NEGATIVE Final   Influenza B by PCR NEGATIVE NEGATIVE Final    Comment: (NOTE) The Xpert Xpress SARS-CoV-2/FLU/RSV plus assay is intended as an aid in the diagnosis of influenza from Nasopharyngeal swab specimens and should not be used as a sole basis for treatment. Nasal washings and aspirates are unacceptable for Xpert Xpress SARS-CoV-2/FLU/RSV testing.  Fact Sheet for Patients: BloggerCourse.com  Fact Sheet for Healthcare Providers: SeriousBroker.it  This test is not yet  approved or cleared by the [place] and has been authorized for detection and/or diagnosis of SARS-CoV-2 by FDA under an Emergency Use Authorization (EUA). This EUA will remain in effect (meaning this test can be used) for the duration of the COVID-19 declaration under Section 564(b)(1) of the Act, 21 U.S.C. section 360bbb-3(b)(1), unless the authorization is terminated or revoked.  Performed at [facility], [address], [place], [place] [address]        Inpatient Medications:   Scheduled Meds:  amitriptyline  50 mg Oral QHS   docusate sodium  100 mg Oral BID   fentaNYL       [START ON 03/13/2021] ferrous sulfate  325 mg Oral BID WC   HYDROmorphone       [START ON 03/13/2021] insulin aspart  0-15 Units Subcutaneous TID WC   insulin aspart  0-5 Units Subcutaneous QHS   [START ON 03/13/2021] insulin aspart  3 Units Subcutaneous TID WC   insulin glargine-yfgn  25 Units Subcutaneous QHS   [START ON 03/13/2021] loratadine  10 mg Oral Daily   [START ON 03/13/2021] losartan  100 mg Oral Daily   metoprolol tartrate  50 mg Oral BID   [START ON 03/13/2021] multivitamin with minerals  1 tablet Oral Daily   oxyCODONE  10 mg Oral Q12H   pantoprazole  40 mg Oral BID AC   pregabalin  75 mg Oral BID   [START ON 03/13/2021] spironolactone  100 mg Oral Daily   Continuous Infusions:  sodium chloride 100 mL/hr at 03/12/21  2034    ceFAZolin (ANCEF) IV 1 g (03/12/21 2046)    Impression/Recommendations Principal Problem:   Closed fracture of distal end of left tibia, initial encounter Per Orthopedic surgery Active Problems:   Hyperkalemia Potassium level was last checked early this am.  Check BMP now and will treat if still elevated.      AKI Change IVF to LR at 75 ml/hr Recheck electrolytes and renal function in am.      Cirrhosis with ascites Check abdominal US and if moderate to large ascites noted will consult IR in am for paracentesis.     DMT2 Continue basal insulin. Is on SSI and meal time insulin already Check HgbA1c level. Further recommendations to follow based on blood sugar levels.   Thank you for this consultation.  Our [facility] hospitalist team will follow the patient with you.   Time Spent: 30 minutes  Carlton Adam M.D. [facility] 03/12/2021, 8:53 PM   Addendum: Potassium level is 5.5 now. Give dose of Lokelma now. Recheck BMP in am Creatinine level is mildly improved to 1.62 from 1.73. Continue LR IVF hydration

## 2021-03-12 NOTE — Progress Notes (Signed)
Pacu RN Report to floor given  Gave report to [employer]. 5N12. Discussed surgery, meds given in OR and Pacu, VS, IV fluids given, EBL, urine output, pain and other pertinent information. Also discussed if pt had any family or friends here or belongings with them.   Discussed Dr Otelia Sergeant manipulating her split cast to allow toes to move. Anesthesia called to place block per patient request. Pt pain is high r/t surgery done.   Pt exits my care.

## 2021-03-12 NOTE — ED Provider Notes (Signed)
[facility] EMERGENCY DEPARTMENT Provider Note   CSN: [id number] Arrival date & time: 03/12/21  0116     History Chief Complaint  Patient presents with   Fall   Ankle Pain    Mallory Stewart is a 60 y.o. female.  60 yo F with a chief complaints of left ankle pain.  The patient was going to the bathroom in the middle the night and she lost her balance and fell.  She denies other significant injury.  She was unable to walk due to pain called EMS.  The history is provided by the patient.  Fall This is a new problem. The problem occurs constantly. The problem has not changed since onset.Pertinent negatives include no chest pain, no headaches and no shortness of breath. The symptoms are aggravated by bending and twisting. Nothing relieves the symptoms.  Ankle Pain Associated symptoms: no fever       Past Medical History:  Diagnosis Date   Alcohol-induced chronic pancreatitis (HCC)    B12 deficiency    Concussion    DDD (degenerative disc disease), cervical    Diabetes (HCC)    DKA, type 2 (HCC)    Gastric outlet obstruction 08/12/2018   Hypertension    Neuropathy    Pancreatic pseudocyst/cyst 05/06/2013   Seasonal allergies     Patient Active Problem List   Diagnosis Date Noted   Impingement of right ankle joint    Gastric varices without bleeding 12/06/2020   Portal hypertension (HCC) 09/30/2020   Dilated bile duct 09/30/2020   Alcoholic cirrhosis of liver with ascites (HCC) 09/29/2020   abdominal acites 09/17/2020   Right foot pain 09/17/2020   Laceration of lesser toe of right foot without foreign body present or damage to nail 09/17/2020   Acute right ankle pain 09/17/2020   Frequent falls 09/14/2020   Closed fracture of ankle 09/14/2020   Foot sprain, right, subsequent encounter 08/26/2020   Sprain of anterior talofibular ligament of right ankle 08/26/2020   Splenomegaly 08/09/2020   Malnutrition of moderate degree (HCC) 08/08/2020    Transaminitis 08/06/2020   Serum total bilirubin elevated 08/06/2020   Fall at home, initial encounter 08/06/2020   Generalized weakness 08/05/2020   Idiopathic peripheral neuropathy 07/13/2020   Type 2 diabetes mellitus with diabetic polyneuropathy, with long-term current use of insulin (HCC) 07/13/2020   Persistent cough for 3 weeks or longer 07/04/2020   Low HDL (under 40) 06/21/2020   Post-nasal drip 06/21/2020   Allergic rhinitis 06/21/2020   Elevated alkaline phosphatase level 06/21/2020   Duodenitis - edema ? andgioedema 10/30/2018   Chronic alcoholic pancreatitis (HCC) 09/02/2018   Diabetes mellitus with circulatory complication, HTN (HCC) 08/22/2018   Dizziness 12/26/2017   Chronic pain 05/17/2017   Osteoarthritis of spine with radiculopathy, cervical region 09/17/2016   B12 deficiency 10/21/2014   Neuropathic pain of both legs 02/11/2014   Hepatic steatosis 04/27/2013   Hyperlipidemia associated with type 2 diabetes mellitus (HCC) 11/03/2008   Hypertension associated with diabetes (HCC) 09/29/2008   PAP SMEAR, ABNORMAL 09/29/2008    Past Surgical History:  Procedure Laterality Date   ANKLE ARTHROSCOPY Right 12/20/2020   Procedure: RIGHT ANKLE ARTHROSCOPIC DEBRIDEMENT;  Surgeon: Nadara Mustard, MD;  Location: [facility];  Service: Orthopedics;  Laterality: Right;   BIOPSY  01/19/2019   Procedure: BIOPSY;  Surgeon: Meridee Score Netty Starring., MD;  Location: [facility];  Service: Gastroenterology;;   CHOLECYSTECTOMY N/A 07/28/2016   Procedure: LAPAROSCOPIC CHOLECYSTECTOMY WITH INTRAOPERATIVE CHOLANGIOGRAM;  Surgeon: Mickeal Skinner, MD;  Location: [place];  Service: General;  Laterality: N/A;   ESOPHAGOGASTRODUODENOSCOPY (EGD) WITH PROPOFOL N/A 08/13/2018   Procedure: ESOPHAGOGASTRODUODENOSCOPY (EGD) WITH PROPOFOL;  Surgeon: Irving Copas., MD;  Location: [place];  Service: Gastroenterology;  Laterality: N/A;   ESOPHAGOGASTRODUODENOSCOPY (EGD) WITH  PROPOFOL N/A 01/19/2019   Procedure: ESOPHAGOGASTRODUODENOSCOPY (EGD) WITH PROPOFOL;  Surgeon: Rush Landmark Telford Nab., MD;  Location: [place];  Service: Gastroenterology;  Laterality: N/A;   IR PARACENTESIS  08/08/2020   LEEP  1990's   ORIF FOOT FRACTURE  09/2008   L 5th metatarsal   REFRACTIVE SURGERY  2000   TONSILLECTOMY     UPPER ESOPHAGEAL ENDOSCOPIC ULTRASOUND (EUS) N/A 08/13/2018   Procedure: UPPER ESOPHAGEAL ENDOSCOPIC ULTRASOUND (EUS);  Surgeon: Irving Copas., MD;  Location: [place];  Service: Gastroenterology;  Laterality: N/A;   UPPER ESOPHAGEAL ENDOSCOPIC ULTRASOUND (EUS) N/A 01/19/2019   Procedure: UPPER ESOPHAGEAL ENDOSCOPIC ULTRASOUND (EUS);  Surgeon: Irving Copas., MD;  Location: [place];  Service: Gastroenterology;  Laterality: N/A;     OB History   No obstetric history on file.     Family History  Problem Relation Age of Onset   Other Mother        tachycardia.Marland KitchenMarland Kitchen?afib   Stroke Father        after hernia suegery   Prostate cancer Father    Other Father        global transient amnesia, unclear source   Atrial fibrillation Sister    Healthy Brother    Healthy Brother    Coronary artery disease Paternal Grandmother    Heart attack Paternal Grandmother 71   Brain cancer Maternal Grandfather        ?   Cancer Paternal Grandfather        ?   Breast cancer Maternal Grandmother     Social History   Tobacco Use   Smoking status: Never   Smokeless tobacco: Never  Vaping Use   Vaping Use: Never used  Substance Use Topics   Alcohol use: Not Currently    Alcohol/week: 7.0 standard drinks    Types: 7 Glasses of wine per week    Comment: wine   Drug use: No    Home Medications Prior to Admission medications   Medication Sig Start Date End Date Taking? Authorizing Provider  ALPRAZolam (XANAX) 0.25 MG tablet TAKE 1 TABLET(0.25 MG) BY MOUTH DAILY AS NEEDED FOR ANXIETY 11/08/20   Bedsole, Amy E, MD  amitriptyline (ELAVIL) 50 MG  tablet TAKE 1 TABLET(50 MG) BY MOUTH AT BEDTIME 03/03/21   Bedsole, Amy E, MD  B-D UF III MINI PEN NEEDLES 31G X 5 MM MISC USE 4 TIMES A DAY AS DIRECTED 11/30/19   Shamleffer, Melanie Crazier, MD  Blood Glucose Monitoring Suppl (ONE TOUCH ULTRA 2) w/Device KIT Use to check blood sugars two times day 08/01/18   Diona Browner, Amy E, MD  cetirizine (ZYRTEC) 10 MG tablet Take 10 mg by mouth at bedtime.    [provider]  empagliflozin (JARDIANCE) 25 MG TABS tablet Take 1 tablet (25 mg total) by mouth daily before breakfast. 01/06/21   Shamleffer, Melanie Crazier, MD  furosemide (LASIX) 40 MG tablet Take 1 tablet (40 mg total) by mouth daily. 01/27/21   Gatha Mayer, MD  hepatitis A virus, PF, vaccine (HAVRIX, PF,) 1440 EL U/ML injection Please Give 1 ml IM on day 0 and repeat in 6 months 10/07/20   Gatha Mayer, MD  insulin glargine,  1 Unit Dial, (TOUJEO SOLOSTAR) 300 UNIT/ML Solostar Pen Inject 30 Units into the skin daily at 2 PM.    [provider]  Lancets (ONETOUCH ULTRASOFT) lancets Use to check blood sugar two times a day. 08/01/18   Bedsole, Amy E, MD  losartan (COZAAR) 100 MG tablet Take 1 tablet (100 mg total) by mouth daily. 06/21/20   Bedsole, Amy E, MD  meloxicam (MOBIC) 7.5 MG tablet TAKE 1 TABLET(7.5 MG) BY MOUTH DAILY 02/01/21   Persons, Bevely Palmer, PA  metoprolol tartrate (LOPRESSOR) 50 MG tablet TAKE 1 TABLET(50 MG) BY MOUTH TWICE DAILY 02/01/21   Bedsole, Amy E, MD  Multiple Vitamin (MULTIVITAMIN WITH MINERALS) TABS tablet Take 1 tablet by mouth daily. 07/25/18   Mikhail, Velta Addison, DO  ONETOUCH ULTRA test strip CHECK BLOOD SUGAR TWO TIMES DAILY AS DIRECTED 03/23/20   Bedsole, Amy E, MD  oxyCODONE-acetaminophen (PERCOCET/ROXICET) 5-325 MG tablet Take 1 tablet by mouth every 12 (twelve) hours as needed for severe pain. 02/10/21   Suzan Slick, NP  pantoprazole (PROTONIX) 40 MG tablet TAKE 1 TABLET BY MOUTH TWICE DAILY BEFORE A MEAL 09/19/20   Gatha Mayer, MD  pregabalin  (LYRICA) 75 MG capsule Take 1 capsule (75 mg total) by mouth 2 (two) times daily. 12/27/20   Suzan Slick, NP  spironolactone (ALDACTONE) 100 MG tablet Take 1 tablet (100 mg total) by mouth daily. 01/27/21   Gatha Mayer, MD    Allergies    Gluten meal and Zocor [simvastatin]  Review of Systems   Review of Systems  Constitutional:  Negative for chills and fever.  HENT:  Negative for congestion and rhinorrhea.   Eyes:  Negative for redness and visual disturbance.  Respiratory:  Negative for shortness of breath and wheezing.   Cardiovascular:  Negative for chest pain and palpitations.  Gastrointestinal:  Negative for nausea and vomiting.  Genitourinary:  Negative for dysuria and urgency.  Musculoskeletal:  Positive for arthralgias and myalgias.  Skin:  Negative for pallor and wound.  Neurological:  Negative for dizziness and headaches.   Physical Exam Updated Vital Signs BP 115/84 (BP Location: Right Arm)   Pulse 99   Temp 97.6 F (36.4 C) (Temporal)   Resp 18   SpO2 98%   Physical Exam Vitals and nursing note reviewed.  Constitutional:      General: She is not in acute distress.    Appearance: She is well-developed. She is not diaphoretic.  HENT:     Head: Normocephalic and atraumatic.  Eyes:     Pupils: Pupils are equal, round, and reactive to light.  Cardiovascular:     Rate and Rhythm: Normal rate and regular rhythm.     Heart sounds: No murmur heard.   No friction rub. No gallop.  Pulmonary:     Effort: Pulmonary effort is normal.     Breath sounds: No wheezing or rales.  Abdominal:     General: There is no distension.     Palpations: Abdomen is soft.     Tenderness: There is no abdominal tenderness.  Musculoskeletal:        General: Swelling and tenderness present.     Cervical back: Normal range of motion and neck supple.     Comments: Pain and swelling to the left ankle worse to the distal tibia.  Pulses and motor intact distally sensation to light touch  is not intact to the entire foot similar to the other side.  Skin:    General:  Skin is warm and dry.  Neurological:     Mental Status: She is alert and oriented to person, place, and time.  Psychiatric:        Behavior: Behavior normal.    ED Results / Procedures / Treatments   Labs (all labs ordered are listed, but only abnormal results are displayed) Labs Reviewed - No data to display  EKG None  Radiology No results found.  Procedures Procedures   Medications Ordered in ED Medications  HYDROmorphone (DILAUDID) injection 1 mg (1 mg Intravenous Given 03/12/21 0209)  ondansetron (ZOFRAN) injection 4 mg (4 mg Intravenous Given 03/12/21 0208)    ED Course  I have reviewed the triage vital signs and the nursing notes.  Pertinent labs & imaging results that were available during my care of the patient were reviewed by me and considered in my medical decision making (see chart for details).    MDM Rules/Calculators/A&P                           60 yo F with a cc of ankle pain.  Patient with a distal tibial spiral fracture.  I discussed case with Dr. Louanne Skye, ortho recommended a posterior splint that he will come and evaluate the patient for admission.  The patients results and plan were reviewed and discussed.   Any x-rays performed were independently reviewed by myself.   Differential diagnosis were considered with the presenting HPI.  Medications  HYDROmorphone (DILAUDID) injection 1 mg (1 mg Intravenous Given 03/12/21 0209)  ondansetron (ZOFRAN) injection 4 mg (4 mg Intravenous Given 03/12/21 0208)    Vitals:   03/12/21 0142  BP: 115/84  Pulse: 99  Resp: 18  Temp: 97.6 F (36.4 C)  TempSrc: Temporal  SpO2: 98%    Final diagnoses:  Closed displaced spiral fracture of shaft of left tibia, initial encounter    Admission/ observation were discussed with the admitting physician, patient and/or family and they are comfortable with the plan.   Final Clinical  Impression(s) / ED Diagnoses Final diagnoses:  Closed displaced spiral fracture of shaft of left tibia, initial encounter    Rx / DC Orders ED Discharge Orders     None        Deno Etienne, DO 03/12/21 0212

## 2021-03-12 NOTE — ED Notes (Signed)
X-ray at bedside

## 2021-03-12 NOTE — Interval H&P Note (Signed)
History and Physical Interval Note:  03/12/2021 1:58 PM  Mallory Stewart  has presented today for surgery, with the diagnosis of Left Tib/Fib fracture.  The various methods of treatment have been discussed with the patient and family. After consideration of risks, benefits and other options for treatment, the patient has consented to  Procedure(s): INTRAMEDULLARY (IM) NAIL TIBIAL (Left) as a surgical intervention.  The patient's history has been reviewed, patient examined, no change in status, stable for surgery.  I have reviewed the patient's chart and labs.  Questions were answered to the patient's satisfaction.     Vira Browns

## 2021-03-12 NOTE — ED Notes (Signed)
Placed consult to Dr. Prudencio Burly office for on call physician per MD. [phone]

## 2021-03-12 NOTE — Discharge Instructions (Addendum)
POST OPERATIVE INSTRUCTIONS: Nonweightbearing on operative extremity Keep splint dry and limb elevated Okay to restart DVT prophylaxis on postoperative day 1. Suitable for 325 mg aspirin daily or baby aspirin 81 mg twice daily for DVT prophylaxis on discharge Call the office with concerns Follow-up in 2 weeks for splint removal, x-rays of the operative ankle, nonweightbearing and suture removal if appropriate.

## 2021-03-12 NOTE — Anesthesia Procedure Notes (Signed)
Anesthesia Regional Block: Popliteal block   Pre-Anesthetic Checklist: , timeout performed,  Correct Patient, Correct Site, Correct Laterality,  Correct Procedure, Correct Position, site marked,  Risks and benefits discussed,  Surgical consent,  Pre-op evaluation,  At surgeon's request and post-op pain management  Laterality: Lower and Left  Prep: chloraprep       Needles:  Injection technique: Single-shot  Needle Type: Stimiplex     Needle Length: 10cm  Needle Gauge: 21     Additional Needles:   Procedures:,,,, ultrasound used (permanent image in chart),,   Motor weakness within 5 minutes.  Narrative:  Start time: 03/12/2021 7:07 PM End time: 03/12/2021 7:15 PM Injection made incrementally with aspirations every 5 mL.  Performed by: Personally  Anesthesiologist: Lewie Loron, MD  Additional Notes: Nerve located and needle positioned with direct ultrasound guidance. Good perineural spread. Patient tolerated well.

## 2021-03-12 NOTE — Anesthesia Preprocedure Evaluation (Addendum)
Anesthesia Evaluation  Patient identified by MRN, date of birth, ID band Patient awake    Reviewed: Allergy & Precautions, NPO status , Patient's Chart, lab work & pertinent test results, reviewed documented beta blocker date and time   History of Anesthesia Complications Negative for: history of anesthetic complications  Airway Mallampati: II  TM Distance: >3 FB Neck ROM: Full    Dental  (+) Dental Advisory Given   Pulmonary neg pulmonary ROS,    Pulmonary exam normal        Cardiovascular hypertension, Pt. on medications and Pt. on home beta blockers Normal cardiovascular exam Rhythm:Regular Rate:Normal   '20 TTE - Normal EF, moderately dilated LA    Neuro/Psych  Vertigo Neuropathy   Neuromuscular disease negative psych ROS   GI/Hepatic (+)     substance abuse  alcohol use,  Chronic pancreatitis    Endo/Other  diabetes, Type 2, Insulin Dependent, Oral Hypoglycemic Agents  Renal/GU negative Renal ROS     Musculoskeletal  (+) Arthritis , Osteoarthritis,    Abdominal   Peds  Hematology negative hematology ROS (+)   Anesthesia Other Findings   Reproductive/Obstetrics                            Anesthesia Physical  Anesthesia Plan  ASA: 3  Anesthesia Plan: General   Post-op Pain Management: GA combined w/ Regional for post-op pain   Induction: Intravenous  PONV Risk Score and Plan: 4 or greater and Treatment may vary due to age or medical condition, Ondansetron, Dexamethasone, Midazolam and Diphenhydramine  Airway Management Planned: LMA  Additional Equipment: None  Intra-op Plan:   Post-operative Plan: Extubation in OR  Informed Consent: I have reviewed the patients History and Physical, chart, labs and discussed the procedure including the risks, benefits and alternatives for the proposed anesthesia with the patient or authorized representative who has indicated  his/her understanding and acceptance.       Plan Discussed with: CRNA  Anesthesia Plan Comments: (Discussed block preop. Dr. Otelia Sergeant believes block should be okay)       Anesthesia Quick Evaluation

## 2021-03-12 NOTE — ED Notes (Signed)
Pt's husband at bedside.

## 2021-03-12 NOTE — ED Triage Notes (Signed)
Pt arrived from home with EMS for ankle injury from fall. Pt got up to use the restroom tonight and had assisted fall by sliding down the door frame. EMS reports L ankle initially dusky in color with delayed cap refill and no pulse. EMS reported improvement of color and pedal pulse after splint. EMS gave fentanyl enroute

## 2021-03-12 NOTE — H&P (Signed)
PREOPERATIVE H&P  Chief Complaint: Left Closed displaced angulated distal tibia diaphyseal metaphyseal oblique spiral apex lateral angulation with proximal fibula fracture also closed.   HPI: Mallory Stewart is a 60 y.o. female who presents for preoperative history and physical with a diagnosis of Left closed displaced angulated distal metaphyseal-diaphyseal tibia fracture oblique spiral with proximal fibula fracture. Reports that she tripped and fell this AM at 1-1:30 AM and had immediate left distal leg pain with swelling and inability to weight bear on the left leg. HIstory of diabetes,controlled with oral agents, cirrhosis of liver, hypertension and peripheral neuropathy. Presents to [facility] ER via ambulance. Symptoms are rated as moderate to severe, and have been worsening.  This is significantly impairing activities of daily living.  She has elected for surgical management.   Past Medical History:  Diagnosis Date   Alcohol-induced chronic pancreatitis ([place])    B12 deficiency    Concussion    DDD (degenerative disc disease), cervical    Diabetes ([place])    DKA, type 2 ([place])    Gastric outlet obstruction 08/12/2018   Hypertension    Neuropathy    Pancreatic pseudocyst/cyst 05/06/2013   Seasonal allergies    Past Surgical History:  Procedure Laterality Date   ANKLE ARTHROSCOPY Right 12/20/2020   Procedure: RIGHT ANKLE ARTHROSCOPIC DEBRIDEMENT;  Surgeon: Newt Minion, MD;  Location: [place];  Service: Orthopedics;  Laterality: Right;   BIOPSY  01/19/2019   Procedure: BIOPSY;  Surgeon: Rush Landmark Telford Nab., MD;  Location: [place];  Service: Gastroenterology;;   CHOLECYSTECTOMY N/A 07/28/2016   Procedure: LAPAROSCOPIC CHOLECYSTECTOMY WITH INTRAOPERATIVE CHOLANGIOGRAM;  Surgeon: Mickeal Skinner, MD;  Location: [place];  Service: General;  Laterality: N/A;   ESOPHAGOGASTRODUODENOSCOPY (EGD) WITH PROPOFOL N/A 08/13/2018   Procedure: ESOPHAGOGASTRODUODENOSCOPY (EGD) WITH  PROPOFOL;  Surgeon: Irving Copas., MD;  Location: [place];  Service: Gastroenterology;  Laterality: N/A;   ESOPHAGOGASTRODUODENOSCOPY (EGD) WITH PROPOFOL N/A 01/19/2019   Procedure: ESOPHAGOGASTRODUODENOSCOPY (EGD) WITH PROPOFOL;  Surgeon: Rush Landmark Telford Nab., MD;  Location: [place];  Service: Gastroenterology;  Laterality: N/A;   IR PARACENTESIS  08/08/2020   LEEP  1990's   ORIF FOOT FRACTURE  09/2008   L 5th metatarsal   REFRACTIVE SURGERY  2000   TONSILLECTOMY     UPPER ESOPHAGEAL ENDOSCOPIC ULTRASOUND (EUS) N/A 08/13/2018   Procedure: UPPER ESOPHAGEAL ENDOSCOPIC ULTRASOUND (EUS);  Surgeon: Irving Copas., MD;  Location: [place];  Service: Gastroenterology;  Laterality: N/A;   UPPER ESOPHAGEAL ENDOSCOPIC ULTRASOUND (EUS) N/A 01/19/2019   Procedure: UPPER ESOPHAGEAL ENDOSCOPIC ULTRASOUND (EUS);  Surgeon: Irving Copas., MD;  Location: [place];  Service: Gastroenterology;  Laterality: N/A;   Social History   Socioeconomic History   Marital status: Married    Spouse name: Not on file   Number of children: 1   Years of education: Not on file   Highest education level: Not on file  Occupational History   Occupation: Mudlogger of business development  Tobacco Use   Smoking status: Never   Smokeless tobacco: Never  Vaping Use   Vaping Use: Never used  Substance and Sexual Activity   Alcohol use: Not Currently    Alcohol/week: 7.0 standard drinks    Types: 7 Glasses of wine per week    Comment: wine   Drug use: No   Sexual activity: Not on file  Other Topics Concern   Not on file  Social History Narrative   Patient is married one stepson  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 mostly when she is not traveling   regular exercise , walking 4-5 times a week   Diet fruits and veggies, water   Alcohol at least several glasses of wine a week Stopped 08/2018   Never smoker, no drug use    Social Determinants of Radio broadcast assistant Strain: Not on file  Food Insecurity: Not on file  Transportation Needs: Not on file  Physical Activity: Not on file  Stress: Not on file  Social Connections: Not on file   Family History  Problem Relation Age of Onset   Other Mother        tachycardia.Marland KitchenMarland Kitchen?afib   Stroke Father        after hernia suegery   Prostate cancer Father    Other Father        global transient amnesia, unclear source   Atrial fibrillation Sister    Healthy Brother    Healthy Brother    Coronary artery disease Paternal Grandmother    Heart attack Paternal Grandmother 28   Brain cancer Maternal Grandfather        ?   Cancer Paternal Grandfather        ?   Breast cancer Maternal Grandmother    Allergies  Allergen Reactions   Gluten Meal Other (See Comments)    Stomach distress   Zocor [Simvastatin] Other (See Comments)    Elevated lfts?   Prior to Admission medications   Medication Sig Start Date End Date Taking? Authorizing Provider  ALPRAZolam (XANAX) 0.25 MG tablet TAKE 1 TABLET(0.25 MG) BY MOUTH DAILY AS NEEDED FOR ANXIETY 11/08/20   Bedsole, Amy E, MD  amitriptyline (ELAVIL) 50 MG tablet TAKE 1 TABLET(50 MG) BY MOUTH AT BEDTIME 03/03/21   Bedsole, Amy E, MD  B-D UF III MINI PEN NEEDLES 31G X 5 MM MISC USE 4 TIMES A DAY AS DIRECTED 11/30/19   Shamleffer, Melanie Crazier, MD  Blood Glucose Monitoring Suppl (ONE TOUCH ULTRA 2) w/Device KIT Use to check blood sugars two times day 08/01/18   Diona Browner, Amy E, MD  cetirizine (ZYRTEC) 10 MG tablet Take 10 mg by mouth at bedtime.    [provider]  empagliflozin (JARDIANCE) 25 MG TABS tablet Take 1 tablet (25 mg total) by mouth daily before breakfast. 01/06/21   Shamleffer, Melanie Crazier, MD  furosemide (LASIX) 40 MG tablet Take 1 tablet (40 mg total) by mouth daily. 01/27/21   Gatha Mayer, MD  hepatitis A virus, PF, vaccine (HAVRIX, PF,) 1440 EL U/ML injection Please Give 1 ml IM on day 0 and  repeat in 6 months 10/07/20   Gatha Mayer, MD  insulin glargine, 1 Unit Dial, (TOUJEO SOLOSTAR) 300 UNIT/ML Solostar Pen Inject 30 Units into the skin daily at 2 PM.    [provider]  Lancets (ONETOUCH ULTRASOFT) lancets Use to check blood sugar two times a day. 08/01/18   Bedsole, Amy E, MD  losartan (COZAAR) 100 MG tablet Take 1 tablet (100 mg total) by mouth daily. 06/21/20   Bedsole, Amy E, MD  meloxicam (MOBIC) 7.5 MG tablet TAKE 1 TABLET(7.5 MG) BY MOUTH DAILY 02/01/21   Persons, Bevely Palmer, PA  metoprolol tartrate (LOPRESSOR) 50 MG tablet TAKE 1 TABLET(50 MG) BY MOUTH TWICE DAILY 02/01/21   Bedsole, Amy E, MD  Multiple Vitamin (MULTIVITAMIN WITH MINERALS) TABS tablet Take 1 tablet by mouth daily. 07/25/18   Cristal Ford, DO  ONETOUCH ULTRA test strip CHECK BLOOD SUGAR TWO TIMES DAILY AS DIRECTED 03/23/20   Bedsole, Amy E, MD  oxyCODONE-acetaminophen (PERCOCET/ROXICET) 5-325 MG tablet Take 1 tablet by mouth every 12 (twelve) hours as needed for severe pain. 02/10/21   Suzan Slick, NP  pantoprazole (PROTONIX) 40 MG tablet TAKE 1 TABLET BY MOUTH TWICE DAILY BEFORE A MEAL 09/19/20   Gatha Mayer, MD  pregabalin (LYRICA) 75 MG capsule Take 1 capsule (75 mg total) by mouth 2 (two) times daily. 12/27/20   Suzan Slick, NP  spironolactone (ALDACTONE) 100 MG tablet Take 1 tablet (100 mg total) by mouth daily. 01/27/21   Gatha Mayer, MD     Positive ROS: All other systems have been reviewed and were otherwise negative with the exception of those mentioned in the HPI and as above.  Physical Exam: General: Alert, no acute distress Cardiovascular: No pedal edema Respiratory: No cyanosis, no use of accessory musculature GI: No organomegaly, abdomen is soft and non-tender Has history of acites and large abdomen secondary to thisl Skin: No lesions in the area of chief complaint Neurologic: Sensation intact distally Psychiatric: Patient is competent for consent with normal mood and  affect Lymphatic: No axillary or cervical lymphadenopathy  MUSCULOSKELETAL: Left leg with mild swelling of the distal leg above the left ankle. Sensory is chronicly decreased left foot, no acute change here, due to peripheral neuropathy. Skin is intact, no tenting of skin. Compartments are soft. Motor toe and ankle dorsiflexion and plantarflexion is intact. DP 2+, PT 2+ Plain radiographs with a displaced varus angulated left distal tibia fracture, obique, spiral with apex lateral angulation and shortening of 2cm. Proximal fibula fracture oblique spiral with shortening and displacement.    Assessment: Left displaced angulated distal tibia metaphyseal-diaphyseal fracture, with proximal fibula fracture.  Baseline bilateral LE peripheral neuropathy Diabetes Mellitus, Type 2 controlled with oral antihyperglycemic agent tujeo and jeardia Cirrhosis of liver likely to history of ETOH   Plan: Plan for Closed reduction in the OR later this AM or when able to proceed in OR assess stability. Due to obliquity of fracture and the adjacent fibula fracture being very proximal to the fracture site the tendency to angulation and shortening is greater with closed treatment and casting. Closed reduction with IM nailing is the recommended treatment to ensure maintanence of reduction and incidence of healing with good outcomel  The risks benefits and alternatives were discussed with the patient including but not limited to the risks of nonoperative treatment, versus surgical intervention including infection, bleeding, nerve injury,  blood clots, cardiopulmonary complications, morbidity, mortality, among others, and they were willing to proceed.   Basil Dess, MD Cell [phone] Office [phone] 03/12/2021 2:58 AM

## 2021-03-12 NOTE — Anesthesia Procedure Notes (Signed)
Anesthesia Regional Block: Adductor canal block   Pre-Anesthetic Checklist: , timeout performed,  Correct Patient, Correct Site, Correct Laterality,  Correct Procedure, Correct Position, site marked,  Risks and benefits discussed,  Surgical consent,  Pre-op evaluation,  At surgeon's request and post-op pain management  Laterality: Lower and Left  Prep: chloraprep       Needles:  Injection technique: Single-shot  Needle Type: Stimiplex     Needle Length: 9cm  Needle Gauge: 21     Additional Needles:   Procedures:,,,, ultrasound used (permanent image in chart),,    Narrative:  Start time: 03/12/2021 6:55 PM End time: 03/12/2021 7:07 PM Injection made incrementally with aspirations every 5 mL.  Performed by: Personally  Anesthesiologist: Lewie Loron, MD  Additional Notes: BP cuff, EKG monitors applied. Sedation begun. Artery and nerve location verified with ultrasound. Anesthetic injected incrementally (33ml), slowly, and after negative aspirations under direct u/s guidance. Good fascial/perineural spread. Tolerated well.

## 2021-03-12 NOTE — Anesthesia Postprocedure Evaluation (Signed)
Anesthesia Post Note  Patient: Mallory Stewart  Procedure(s) Performed: INTRAMEDULLARY (IM) NAIL TIBIAL (Left: Leg Lower)     Patient location during evaluation: PACU Anesthesia Type: General Level of consciousness: sedated and patient cooperative Pain management: pain level controlled Vital Signs Assessment: post-procedure vital signs reviewed and stable Respiratory status: spontaneous breathing Cardiovascular status: stable Anesthetic complications: no   No notable events documented.  Last Vitals:  Vitals:   03/12/21 1925 03/12/21 1948  BP: 100/72 106/80  Pulse: (!) 103 (!) 103  Resp: 20 18  Temp:  36.9 C  SpO2: 92% 95%    Last Pain:  Vitals:   03/12/21 1948  TempSrc: Oral  PainSc: 0-No pain                 Lewie Loron

## 2021-03-12 NOTE — Anesthesia Procedure Notes (Addendum)
Procedure Name: LMA Insertion Date/Time: 03/12/2021 2:24 PM Performed by: Epifanio Lesches, CRNA Pre-anesthesia Checklist: Patient identified, Emergency Drugs available, Suction available and Patient being monitored Patient Re-evaluated:Patient Re-evaluated prior to induction Oxygen Delivery Method: Circle System Utilized Preoxygenation: Pre-oxygenation with 100% oxygen Induction Type: IV induction Ventilation: Mask ventilation without difficulty LMA: LMA inserted LMA Size: 4.0 Number of attempts: 1 Airway Equipment and Method: Bite block Placement Confirmation: positive ETCO2 Tube secured with: Tape Dental Injury: Teeth and Oropharynx as per pre-operative assessment

## 2021-03-13 ENCOUNTER — Inpatient Hospital Stay (HOSPITAL_COMMUNITY): Payer: 59

## 2021-03-13 DIAGNOSIS — S82302A Unspecified fracture of lower end of left tibia, initial encounter for closed fracture: Secondary | ICD-10-CM | POA: Diagnosis not present

## 2021-03-13 HISTORY — PX: IR PARACENTESIS: IMG2679

## 2021-03-13 LAB — BASIC METABOLIC PANEL
Anion gap: 9 (ref 5–15)
BUN: 28 mg/dL — ABNORMAL HIGH (ref 6–20)
CO2: 23 mmol/L (ref 22–32)
Calcium: 8.5 mg/dL — ABNORMAL LOW (ref 8.9–10.3)
Chloride: 98 mmol/L (ref 98–111)
Creatinine, Ser: 1.52 mg/dL — ABNORMAL HIGH (ref 0.44–1.00)
GFR, Estimated: 40 mL/min — ABNORMAL LOW (ref >60)
Glucose, Bld: 246 mg/dL — ABNORMAL HIGH (ref 70–99)
Potassium: 6 mmol/L — ABNORMAL HIGH (ref 3.5–5.1)
Sodium: 130 mmol/L — ABNORMAL LOW (ref 135–145)

## 2021-03-13 LAB — CBC
HCT: 25.4 % — ABNORMAL LOW (ref 36.0–46.0)
Hemoglobin: 8.5 g/dL — ABNORMAL LOW (ref 12.0–15.0)
MCH: 29.9 pg (ref 26.0–34.0)
MCHC: 33.5 g/dL (ref 30.0–36.0)
MCV: 89.4 fL (ref 80.0–100.0)
Platelets: 118 10*3/uL — ABNORMAL LOW (ref 150–400)
RBC: 2.84 MIL/uL — ABNORMAL LOW (ref 3.87–5.11)
RDW: 14 % (ref 11.5–15.5)
WBC: 4.5 10*3/uL (ref 4.0–10.5)
nRBC: 0 % (ref 0.0–0.2)

## 2021-03-13 LAB — GLUCOSE, CAPILLARY
Glucose-Capillary: 245 mg/dL — ABNORMAL HIGH (ref 70–99)
Glucose-Capillary: 177 mg/dL — ABNORMAL HIGH (ref 70–99)
Glucose-Capillary: 191 mg/dL — ABNORMAL HIGH (ref 70–99)
Glucose-Capillary: 124 mg/dL — ABNORMAL HIGH (ref 70–99)

## 2021-03-13 LAB — HEMOGLOBIN A1C
Hgb A1c MFr Bld: 7.8 % — ABNORMAL HIGH (ref 4.8–5.6)
Mean Plasma Glucose: 177 mg/dL

## 2021-03-13 MED ORDER — LIDOCAINE HCL (PF) 1 % IJ SOLN
INTRAMUSCULAR | Status: DC | PRN
  Administered 2021-03-13: 5 mL

## 2021-03-13 MED ORDER — SODIUM ZIRCONIUM CYCLOSILICATE 5 G PO PACK
5.0000 g | PACK | Freq: Once | ORAL | Status: AC
  Administered 2021-03-13: 5 g via ORAL
  Filled 2021-03-13: qty 1

## 2021-03-13 MED ORDER — LIDOCAINE HCL 1 % IJ SOLN
INTRAMUSCULAR | Status: AC
  Filled 2021-03-13: qty 20

## 2021-03-13 NOTE — Evaluation (Addendum)
Occupational Therapy Evaluation Patient Details Name: Mallory Stewart MRN: [id number] DOB: Sep 13, 1960 Today's Date: 03/13/2021   History of Present Illness Patient is a 60 y/o female who presents with left tib/fib fx s/p fall at home, now s/p IM nail left tibia 03/12/21. Also with AKI, hyperkalemia and cirrhosis with ascites. PMH includes HTN, DM, neuropathy.   Clinical Impression   PTA, pt independent with ADLs and functional mobility. Pt lives in accessible home environment with spouse available to help frequently if needed. Pt  requires supervision to move supine <> seated EOB, able to simulate LE dressing using compensatory strategies. Pt states she is experiencing LLE pain, and her nerve block may be wearing off. Pt able to complete STS transfer x 4 with max encouragement, adheres to NWB precautions throughout session. Educated pt on seated shower setup to keep LLE out of shower, as well as kicking LLE outward when standing from toilet/bed/chair to adhere to precautions. Pt limited by pain, impaired balance, impaired strength, activity tolerance, and coordination. Will continue to follow acutely to address functional impairments listed. Recommend d/c home with HHOT/family support at d/c.     Recommendations for follow up therapy are one component of a multi-disciplinary discharge planning process, led by the attending physician.  Recommendations may be updated based on patient status, additional functional criteria and insurance authorization.   Follow Up Recommendations  No OT follow up    Assistance Recommended at Discharge Intermittent Supervision/Assistance  Functional Status Assessment  Patient has had a recent decline in their functional status and demonstrates the ability to make significant improvements in function in a reasonable and predictable amount of time.  Equipment Recommendations  None recommended by OT    Recommendations for Other Services PT consult      Precautions / Restrictions Precautions Precautions: Fall Required Braces or Orthoses: Splint/Cast Splint/Cast: LLE Restrictions Weight Bearing Restrictions: Yes LLE Weight Bearing: Non weight bearing      Mobility Bed Mobility Overal bed mobility: Needs Assistance Bed Mobility: Supine to Sit;Sit to Supine     Supine to sit: Supervision;HOB elevated Sit to supine: Supervision;HOB elevated   General bed mobility comments: pt says LLE pain has increased compared to this morning, believes nerve block is wearing off    Transfers Overall transfer level: Needs assistance Equipment used: Rolling walker (2 wheels)   Sit to Stand: Mod assist;From elevated surface           General transfer comment: pt perform STS transfer x4, required v/c's for hand positioning and to adhere to WB precautions and reassurance      Balance Overall balance assessment: Needs assistance;History of Falls   Sitting balance-Leahy Scale: Good     Standing balance support: During functional activity Standing balance-Leahy Scale: Poor                             ADL either performed or assessed with clinical judgement   ADL Overall ADL's : Needs assistance/impaired Eating/Feeding: Modified independent   Grooming: Sitting;Set up   Upper Body Bathing: Sitting;Set up   Lower Body Bathing: Moderate assistance;Sitting/lateral leans   Upper Body Dressing : Set up;Sitting   Lower Body Dressing: Sitting/lateral leans;Moderate assistance Lower Body Dressing Details (indicate cue type and reason): able to cross legs to don LE clothing using figure 4 technique Toilet Transfer: Moderate assistance;Rolling walker (2 wheels) Toilet Transfer Details (indicate cue type and reason): simulated with RW while adhering to NWB LLE precautions  Toileting- Clothing Manipulation and Hygiene: Minimal assistance;Sitting/lateral lean       Functional mobility during ADLs: Minimal assistance General ADL  Comments: pt very anxious throughout session, however motivated to be able to use RW for transfer. Pt states her husband is available at home to help her as needed.     Vision   Vision Assessment?: No apparent visual deficits     Perception     Praxis      Pertinent Vitals/Pain Pain Assessment: Faces (states nerve block may be wearing off) Pain Score: 3  Faces Pain Scale: Hurts a little bit Pain Location: LLE Pain Descriptors / Indicators: Constant;Discomfort Pain Intervention(s): Limited activity within patient's tolerance;Monitored during session;Relaxation     Hand Dominance Right   Extremity/Trunk Assessment Upper Extremity Assessment Upper Extremity Assessment: Overall WFL for tasks assessed   Lower Extremity Assessment Lower Extremity Assessment: Defer to PT evaluation   Cervical / Trunk Assessment Cervical / Trunk Assessment: Normal   Communication     Cognition Arousal/Alertness: Awake/alert Behavior During Therapy: Anxious;WFL for tasks assessed/performed Overall Cognitive Status: Within Functional Limits for tasks assessed                                       General Comments       Exercises     Shoulder Instructions      Home Living Family/patient expects to be discharged to:: Private residence Living Arrangements: Spouse/significant other Available Help at Discharge: Family;Available 24 hours/day Type of Home: House Home Access: Level entry     Home Layout: Two level;Able to live on main level with bedroom/bathroom     Bathroom Shower/Tub: Producer, television/film/video: Handicapped height Bathroom Accessibility: Yes   Home Equipment: Agricultural consultant (2 wheels);Shower seat - built in;BSC/3in1;Cane - single point          Prior Functioning/Environment Prior Level of Function : Independent/Modified Independent             Mobility Comments: Uses SPC vs RW as needed due to neuropathy BLEs distal to knees. ADLs  Comments: works for [employer] and individuals with disabilites, can work from home        OT Problem List: Decreased strength;Decreased range of motion;Decreased activity tolerance;Impaired balance (sitting and/or standing);Pain;Decreased knowledge of use of DME or AE;Decreased coordination      OT Treatment/Interventions: Self-care/ADL training;Therapeutic exercise;DME and/or AE instruction;Patient/family education    OT Goals(Current goals can be found in the care plan section) Acute Rehab OT Goals Patient Stated Goal: "return home" OT Goal Formulation: With patient Time For Goal Achievement: 03/13/21 Potential to Achieve Goals: Good ADL Goals Pt Will Perform Lower Body Dressing: with min assist;sitting/lateral leans Pt Will Transfer to Toilet: with min assist;bedside commode Pt Will Perform Tub/Shower Transfer: with min assist;ambulating;shower seat  OT Frequency: Min 2X/week   Barriers to D/C:            Co-evaluation              AM-PAC OT "6 Clicks" Daily Activity     Outcome Measure Help from another person eating meals?: None Help from another person taking care of personal grooming?: None Help from another person toileting, which includes using toliet, bedpan, or urinal?: A Little Help from another person bathing (including washing, rinsing, drying)?: A Lot Help from another person to put on and taking off regular upper body clothing?: None  Help from another person to put on and taking off regular lower body clothing?: A Lot 6 Click Score: 19   End of Session Equipment Utilized During Treatment: Gait belt;Rolling walker (2 wheels) Nurse Communication: Mobility status  Activity Tolerance: Patient limited by pain;Patient tolerated treatment well (limited by anxiety) Patient left: in bed;with call bell/phone within reach;with bed alarm set  OT Visit Diagnosis: Unsteadiness on feet (R26.81);Other abnormalities of gait and mobility (R26.89);Pain;Muscle  weakness (generalized) (M62.81);History of falling (Z91.81) Pain - Right/Left: Left Pain - part of body: Leg                Time: 7322-0254 OT Time Calculation (min): 31 min Charges:  OT General Charges $OT Visit: 1 Visit OT Evaluation $OT Eval Low Complexity: 1 Low OT Treatments $Self Care/Home Management : 8-22 mins  Alfonzo Beers, OTD, OTR/L Acute Rehab [phone]   Mayer Masker 03/13/2021, 3:57 PM

## 2021-03-13 NOTE — Progress Notes (Signed)
Foley inserted per provider. Voided 200, post void residual 675.

## 2021-03-13 NOTE — Progress Notes (Signed)
PROGRESS NOTE  Mallory Stewart  DOB: 1961/02/18  PCP: Excell Seltzer, MD VQQ:[id number]  DOA: 03/12/2021  LOS: 1 day  Hospital Day: 2  Chief Complaint  Patient presents with   Fall   Ankle Pain    Brief narrative: Mallory Stewart is a 60 y.o. female with PMH significant for DM2, alcoholic liver cirrhosis, portal hypertension, ascites, chronic pancreatitis, vitamin B12 deficiency who lives at home. Patient presented to the ED on 11/6 after a fall at home.  Imaging showed left displaced angulated distal tibia/fibula fracture. Admitted to orthopedic service and underwent left intramedullary nailing of tibia. Medicine consultation was called for comanagement of medical issues. Patient follows up with Dr. Leone Payor GI.  She reports that few weeks ago, she was noted to have increased ascites and hence the doses of Lasix and Aldactone were increased.  In last several days, patient has been getting intermittent lightheadedness. On 11/6, while walking, she felt lightheaded, she held onto her daughter nearby and slowly slumped to the floor.  Did not pass out.  Her husband heard her in it and it immediately. Labs on admission showed potassium elevated to 5.5 with creatinine elevated 1.78 from a baseline of 0.96, blood sugar was elevated to over 300.   Subjective: Patient was seen and examined this am.  Sitting up in chair.  Not in distress.  Not on supplemental oxygen.  On IV fluid.  Able to give the details of history by self.  No family at bedside. Chart reviewed Hemodynamically stable overnight.  Was requiring 2 L oxygen Labs from this morning with potassium elevated to 6, creatinine slightly better at 1.5, hemoglobin down to 8.5.Marland Kitchen  Blood glucose level trending over 200, was 245 this morning  Assessment/Plan: Closed fracture of distal end of left tibia -Sp intramedullary nailing by orthopedics -Prophylaxis and pain management per orthopedics team  Near syncope -It seems patient got  lightheaded and gradually slumped to the floor. Her diuretics doses were increased lately.  Her admission labs showed AKI.  I think patient was getting orthostatic hypotension leading to near syncope and fall. -Currently on normal saline at 75 mill per hour.  I will continue the same.  Lasix on hold.  AKI -Creatinine less than 1 at baseline, presented with creatinine elevated 1.78.  Gradually improving.  Continue to monitor. Recent Labs    08/08/20 0446 08/18/20 0923 08/24/20 1228 09/23/20 0914 11/04/20 1031 12/06/20 1205 03/12/21 0228 03/12/21 0442 03/12/21 2058 03/13/21 0130  BUN <5* 9 17 7 12 12  32* 32* 28* 28*  CREATININE 0.78 0.99 1.24* 0.93 1.15 0.96 1.78* 1.74* 1.62* 1.52*   Hyperkalemia -Potassium was elevated on admission to 5.5 and worsened to 6 on last lab this morning.  I ordered for Lokelma. -Continue to monitor labs. Recent Labs  Lab 03/12/21 0228 03/12/21 0442 03/12/21 2058 03/13/21 0130  K 4.7 5.5* 5.5* 6.0*   Alcoholic liver cirrhosis cirrhosis with ascites Essential hypertension -Home med include metoprolol 50 mg twice daily, Lasix 40 mg daily, Aldactone 100 mg daily, losartan 100 mg daily, -Continue metoprolol. Keep Lasix, Aldactone and losartan on hold in view of AKI and hyperkalemia. -Ultrasound abdomen showed considerable abdominal ascites.  Paracentesis obtained.  5.6 L of amber-colored fluid removed. -Continue PPI. -Follows up with Dr. 13/07/22 as an outpatient.  Type 2 diabetes mellitus with hyperglycemia -A1c 7.9 -Home meds include Lantus 30 units daily, Jardiance 25 mg daily, -Currently on Semglee 25 units at bedtime, with sliding scale insulin. -Blood sugar level  to monitor. Recent Labs  Lab 03/12/21 1446 03/12/21 1746 03/12/21 2204 03/13/21 0818 03/13/21 1214  GLUCAP 170* 96 196* 245* 177*   Anxiety/depression -Home meds include Elavil 50 mg at bedtime, cetirizine 10 mg at bedtime, Lyrica 75 mg daily   Mobility: PT eval Living  condition: Lives at home with husband Goals of care:   Code Status: Full Code  Nutritional status: Body mass index is 23.92 kg/m.     Diet:  Diet Order             Diet Carb Modified Fluid consistency: Thin; Room service appropriate? Yes  Diet effective now                  DVT prophylaxis:  Place TED hose Start: 03/12/21 1948 Foot Pump / plexipulse Start: 03/12/21 1948   Antimicrobials: None Fluid: LR@75  Consultants: Orthopedics Family Communication: None at bedside  Status is: Inpatient Remains inpatient appropriate because: Needs electrolyte monitoring, PT eval  Dispo: The patient is from: Home              Anticipated d/c is to: Pending PT eval              Patient currently is not medically stable to d/c.   Difficult to place patient No     Infusions:   lactated ringers 75 mL/hr at 03/12/21 2210    Scheduled Meds:  amitriptyline  50 mg Oral QHS   docusate sodium  100 mg Oral BID   ferrous sulfate  325 mg Oral BID WC   insulin aspart  0-15 Units Subcutaneous TID WC   insulin aspart  0-5 Units Subcutaneous QHS   insulin glargine-yfgn  25 Units Subcutaneous QHS   lidocaine       loratadine  10 mg Oral Daily   metoprolol tartrate  50 mg Oral BID   multivitamin with minerals  1 tablet Oral Daily   oxyCODONE  10 mg Oral Q12H   pantoprazole  40 mg Oral BID AC   pregabalin  75 mg Oral BID   sodium zirconium cyclosilicate  5 g Oral Once    PRN meds: acetaminophen, bisacodyl, diphenhydrAMINE, HYDROmorphone (DILAUDID) injection, lidocaine (PF), metoCLOPramide **OR** metoCLOPramide (REGLAN) injection, ondansetron **OR** ondansetron (ZOFRAN) IV, oxyCODONE, oxyCODONE, polyethylene glycol, sodium phosphate   Antimicrobials: Anti-infectives (From admission, onward)    Start     Dose/Rate Route Frequency Ordered Stop   03/12/21 2030  ceFAZolin (ANCEF) IVPB 1 g/50 mL premix        1 g 100 mL/hr over 30 Minutes Intravenous Every 6 hours 03/12/21 1947 03/13/21  0852   03/12/21 1300  ceFAZolin (ANCEF) IVPB 2g/100 mL premix        2 g 200 mL/hr over 30 Minutes Intravenous On call to O.R. 03/12/21 1206 03/12/21 1455   03/12/21 1216  ceFAZolin (ANCEF) 2-4 GM/100ML-% IVPB       Note to Pharmacy: Launa Flight   : cabinet override      03/12/21 1216 03/12/21 1439       Objective: Vitals:   03/13/21 0801 03/13/21 1100  BP: (!) 134/91 118/79  Pulse: 91   Resp: 16   Temp: 98.6 F (37 C)   SpO2: 97%     Intake/Output Summary (Last 24 hours) at 03/13/2021 1328 Last data filed at 03/13/2021 0614 Gross per 24 hour  Intake 2190 ml  Output 550 ml  Net 1640 ml   Filed Weights   03/12/21 1231  Weight: 73.5 kg  Weight change:  Body mass index is 23.92 kg/m.   Physical Exam: General exam: Pleasant middle-aged Caucasian female.  Not in distress Skin: No rashes, lesions or ulcers. HEENT: Atraumatic, normocephalic, no obvious bleeding Lungs: Clear to auscultation bilaterally CVS: Regular rate and rhythm, no murmur GI/Abd soft, nontender, distended from ascites, bowel sound present CNS: Alert, awake, oriented times 3.  Mild tremors Psychiatry: Mood appropriate Extremities: No pedal edema, no calf tenderness.  Left leg status post surgery.  Data Review: I have personally reviewed the laboratory data and studies available.  F/u labs ordered Unresulted Labs (From admission, onward)     Start     Ordered   03/14/21 0500  Ammonia  Tomorrow morning,   R        03/13/21 1325   03/14/21 0500  CBC with Differential/Platelet  Daily,   R      03/13/21 1325   03/14/21 0500  Basic metabolic panel  Daily,   R      03/13/21 1325   03/14/21 0500  Magnesium  Tomorrow morning,   STAT        03/13/21 1325   03/14/21 0500  Phosphorus  Tomorrow morning,   R        03/13/21 1325            Signed, Lorin Glass, MD [facility] 03/13/2021

## 2021-03-13 NOTE — Progress Notes (Signed)
Mobility Specialist Progress Note   03/13/21 1743  Mobility  Activity Sat and stood x 3 (x10)  Level of Assistance Minimal assist, patient does 75% or more  Assistive Device Front wheel walker  LLE Weight Bearing NWB  Distance Ambulated (ft) 0 ft  Mobility Ambulated with assistance in room  Mobility Response Tolerated well  Mobility performed by Mobility specialist  $Mobility charge 1 Mobility   Received pt in bed having no complaints and agreeable to mobility. Pt mod Ind. for bed mobility but minA STS. X10 STS, Pt inc'd in stability w/ every rep, last three reps were done with standbyA. Returned to bed w/ call bell by side and all needs met.     Pre Mobility: 94 HR, 93% SpO2 During Mobility: 108 HR,  89% SpO2 Post Mobility: 101 HR, 94% SpO2  Holland Falling Mobility Specialist Phone Number [phone]

## 2021-03-13 NOTE — Procedures (Signed)
Ultrasound-guidedtherapeutic paracentesis performed yielding 5.6 liters of straw colored fluid. . No immediate complications. EBL is none. ? ?

## 2021-03-13 NOTE — Progress Notes (Signed)
     Subjective: 1 Day Post-Op Procedure(s) (LRB): INTRAMEDULLARY (IM) NAIL TIBIAL (Left) Awake, sitting up in bed, she has had ascites removed and feels the best she has concerning abdomenal discomfort. Had urinary retention and drained 1100 cc of urine with foley placed today. Nursing called indicating that she lost use of the left leg with no movement of the left leg toes.   Patient reports pain as mild.    Objective:   VITALS:  Temp:  [97.4 F (36.3 C)-98.7 F (37.1 C)] 98.5 F (36.9 C) (11/07 1459) Pulse Rate:  [82-106] 82 (11/07 1459) Resp:  [11-23] 16 (11/07 1459) BP: (99-139)/(63-100) 102/63 (11/07 1459) SpO2:  [90 %-100 %] 100 % (11/07 1459)  ABD soft Intact pulses distally Incision: scant drainage Compartment soft The left leg posterior splint is removed to prevent any compression of soft tissues about the left fibula head and over the distal lateral foot. She had a left lower leg block performed yesterday. The left foot has swelling as is seen below the level of an injury the tibia fracture. The left foot is warm and has good normal color. The left lateral calf is soft to touch, the compartments feel soft. The hallmarks of a compartment syndrome pallor, pain, paresthesias and paralysis are not present, only paralysis is apparent and the left proximal fibula fracture is at the level of the peroneal nerve. Also the kerlix used to wrap the left knee for saturaton of previous bandage was thick about the upper leg and lower knee, This was removed.   LABS Recent Labs    03/12/21 0228 03/12/21 0442 03/13/21 0130  HGB 10.5* 10.0* 8.5*  WBC 4.6 5.0 4.5  PLT 144* 147* 118*   Recent Labs    03/12/21 2058 03/13/21 0130  NA 130* 130*  K 5.5* 6.0*  CL 97* 98  CO2 25 23  BUN 28* 28*  CREATININE 1.62* 1.52*  GLUCOSE 209* 246*   No results for input(s): LABPT, INR in the last 72 hours.   Assessment/Plan: 1 Day Post-Op Procedure(s) (LRB): INTRAMEDULLARY (IM) NAIL  TIBIAL (Left) Patient indicates that the left leg besides having numbness chronically does have baseline weakness.  Advance diet Up with therapy Elevation of the left leg. Remove any dressing that may be impresssing on the lefl peroneal nerve. I believe that swelling and hematoma about the left peroneal  Nerve at the proximal fibula is likely to be the cause of her acute weakness. The compartments are soft so fasciotomy is not indicated.  Vira Browns 03/13/2021, 5:46 PM Patient ID: Mallory Stewart, female   DOB: April 22, 1961, 60 y.o.   MRN: [id number]

## 2021-03-13 NOTE — Evaluation (Signed)
Physical Therapy Evaluation Patient Details Name: Mallory Stewart MRN: [id number] DOB: [birth year] Today's Date: 03/13/2021  History of Present Illness  Patient is a 60 y/o female who presents with left tib/fib fx s/p fall at home, now s/p IM nail left tibia 03/12/21. Also with AKI, hyperkalemia and cirrhosis with ascites. PMH includes HTN, DM, neuropathy.  Clinical Impression  Patient presents with impaired sensation BLEs distal to knees, anxiety, generalized weakness and impaired mobility s/p above. Pt lives at home with her spouse and reports using RW vs SPC as needed for ambulation. Hx of falls. Today, pt requires Mod A to stand and for SPT to chair with difficulty maintaining NWB LLE. Reports she was not able to hop last time she broke her other ankle and performed lateral scoot transfers. Reports she is not going to rehab but will be returning home with support of spouse; has a w/c accessible home and states she will be renting a w/c until she can place weight through her foot. Instructed pt in there ex. Will follow acutely to maximize independence and mobility prior to return home.       Recommendations for follow up therapy are one component of a multi-disciplinary discharge planning process, led by the attending physician.  Recommendations may be updated based on patient status, additional functional criteria and insurance authorization.  Follow Up Recommendations Home health PT    Assistance Recommended at Discharge Intermittent Supervision/Assistance  Functional Status Assessment Patient has had a recent decline in their functional status and demonstrates the ability to make significant improvements in function in a reasonable and predictable amount of time.  Equipment Recommendations  Wheelchair (measurements PT);Wheelchair cushion (measurements PT)    Recommendations for Other Services OT consult     Precautions / Restrictions Precautions Precautions: Fall Required Braces or  Orthoses: Splint/Cast Splint/Cast: LLE Restrictions Weight Bearing Restrictions: Yes LLE Weight Bearing: Non weight bearing      Mobility  Bed Mobility Overal bed mobility: Needs Assistance Bed Mobility: Supine to Sit     Supine to sit: Min guard;HOB elevated     General bed mobility comments: Able to bring LEs to EOB and sit up without assist, reports feeling vibrations once upright?    Transfers Overall transfer level: Needs assistance Equipment used: Rolling walker (2 wheels) Transfers: Sit to/from Stand;Bed to chair/wheelchair/BSC Sit to Stand: Mod assist   Step pivot transfers: Mod assist       General transfer comment: Mod A to power to standing with cues for hand placement, not compliant with WB status during transition. Needs cues for proper placement of right foot prior to standing due to neuropathy. Able to shuffle right foot minimally towards chair with LOB posteriorly and difficulty maintaining NWB.    Ambulation/Gait               General Gait Details: Reports she could not hop when she broke her other ankle in May so did scoots to transfer  Stairs            Wheelchair Mobility    Modified Rankin (Stroke Patients Only)       Balance Overall balance assessment: Needs assistance;History of Falls Sitting-balance support: Feet supported;Bilateral upper extremity supported Sitting balance-Leahy Scale: Fair Sitting balance - Comments: BUE support as position of comfort, anxious   Standing balance support: During functional activity Standing balance-Leahy Scale: Poor Standing balance comment: External support for upright with use of RW, unable to keep NWB status.  Pertinent Vitals/Pain Pain Assessment: No/denies pain    Home Living Family/patient expects to be discharged to:: Private residence Living Arrangements: Spouse/significant other Available Help at Discharge: Family;Available 24  hours/day Type of Home: House Home Access: Level entry     Alternate Level Stairs-Number of Steps: bonus room upstairs Home Layout: Two level;Able to live on main level with bedroom/bathroom Home Equipment: Rolling Walker (2 wheels);Shower seat - built in;BSC/3in1;Cane - single point Additional Comments: home was made to be w/c accessible; husband renting a w/c    Prior Function Prior Level of Function : Independent/Modified Independent             Mobility Comments: Uses SPC vs RW as needed due to neuropathy BLEs distal to knees. ADLs Comments: Does own ADLs, IADLs. Drives. Works with people with disabilities. Travels a Insurance under[employer]   Dominant Hand: Right    Extremity/Trunk Assessment   Upper Extremity Assessment Upper Extremity Assessment: Defer to OT evaluation    Lower Extremity Assessment Lower Extremity Assessment: RLE deficits/detail;LLE deficits/detail RLE Deficits / Details: Decreased sensation distal to knee as baseline. RLE Sensation: history of peripheral neuropathy;decreased light touch;decreased proprioception RLE Coordination: decreased fine motor;decreased gross motor LLE Deficits / Details: Not able to wiggle toes, no sensatin distal to knee at baseline. Able to perform SLR with knee extension lag. LLE Sensation: history of peripheral neuropathy;decreased light touch;decreased proprioception LLE Coordination: decreased fine motor;decreased gross motor    Cervical / Trunk Assessment Cervical / Trunk Assessment: Normal  Communication   Communication: No difficulties  Cognition Arousal/Alertness: Awake/alert Behavior During Therapy: Anxious Overall Cognitive Status: Within Functional Limits for tasks assessed                                          General Comments General comments (skin integrity, edema, etc.): Reports nerve block still in effect.    Exercises General Exercises - Lower Extremity Quad Sets: AROM;Both;10  reps;Seated Straight Leg Raises: AROM;Left;5 reps;Seated   Assessment/Plan    PT Assessment Patient needs continued PT services  PT Problem List Decreased range of motion;Decreased strength;Decreased mobility;Decreased safety awareness;Impaired sensation;Decreased balance;Decreased skin integrity;Decreased activity tolerance       PT Treatment Interventions Therapeutic activities;Patient/family education;DME instruction;Functional mobility training;Balance training;Wheelchair mobility training;Therapeutic exercise    PT Goals (Current goals can be found in the Care Plan section)  Acute Rehab PT Goals Patient Stated Goal: to go home PT Goal Formulation: With patient Time For Goal Achievement: 03/27/21 Potential to Achieve Goals: Good    Frequency Min 3X/week   Barriers to discharge        Co-evaluation               AM-PAC PT "6 Clicks" Mobility  Outcome Measure Help needed turning from your back to your side while in a flat bed without using bedrails?: A Little Help needed moving from lying on your back to sitting on the side of a flat bed without using bedrails?: A Little Help needed moving to and from a bed to a chair (including a wheelchair)?: A Lot Help needed standing up from a chair using your arms (e.g., wheelchair or bedside chair)?: A Lot Help needed to walk in hospital room?: Total Help needed climbing 3-5 steps with a railing? : Total 6 Click Score: 12    End of Session Equipment Utilized During Treatment: Gait belt Activity  Tolerance: Other (comment) (anxiety) Patient left: in chair;with call bell/phone within reach;with chair alarm set Nurse Communication: Mobility status PT Visit Diagnosis: Unsteadiness on feet (R26.81);Difficulty in walking, not elsewhere classified (R26.2)    Time: 2703-5009 PT Time Calculation (min) (ACUTE ONLY): 25 min   Charges:   PT Evaluation $PT Eval Moderate Complexity: 1 Mod PT Treatments $Therapeutic Activity: 8-22  mins        Vale Haven, PT, DPT Acute Rehabilitation Services Pager [phone] Office [phone]     Blake Divine A Lanier Ensign 03/13/2021, 9:38 AM

## 2021-03-13 NOTE — Progress Notes (Signed)
Inpatient Diabetes Program Recommendations  AACE/ADA: New Consensus Statement on Inpatient Glycemic Control (2015)  Target Ranges:  Prepandial:   less than 140 mg/dL      Peak postprandial:   less than 180 mg/dL (1-2 hours)      Critically ill patients:  140 - 180 mg/dL   Lab Results  Component Value Date   GLUCAP 245 (H) 03/13/2021   HGBA1C 7.9 (H) 03/12/2021    Review of Glycemic Control Results for [name], Mallory Stewart" (MRN [id number]) as of 03/13/2021 10:31  Ref. Range 03/12/2021 17:46 03/12/2021 22:04 03/13/2021 08:18  Glucose-Capillary Latest Ref Range: 70 - 99 mg/dL 96 431 (H) 540 (H)   Diabetes history: Type 2 DM Outpatient Diabetes medications: Jardiance 25 mg QD, Toujeo 30 units QD Current orders for Inpatient glycemic control: Semglee 25 units QHS, Novolog 0-15 units TID & Hs  Inpatient Diabetes Program Recommendations:    Consider increasing Semglee to patient's home dose of 30 units QD.   Thanks, Lujean Rave, MSN, RNC-OB Diabetes Coordinator [phone] (8a-5p)

## 2021-03-14 ENCOUNTER — Encounter (HOSPITAL_COMMUNITY): Payer: Self-pay | Admitting: Specialist

## 2021-03-14 DIAGNOSIS — S82302A Unspecified fracture of lower end of left tibia, initial encounter for closed fracture: Secondary | ICD-10-CM | POA: Diagnosis not present

## 2021-03-14 LAB — CBC WITH DIFFERENTIAL/PLATELET
Abs Immature Granulocytes: 0.04 10*3/uL (ref 0.00–0.07)
Abs Immature Granulocytes: 0.03 10*3/uL (ref 0.00–0.07)
Basophils Absolute: 0 10*3/uL (ref 0.0–0.1)
Basophils Absolute: 0 10*3/uL (ref 0.0–0.1)
Basophils Relative: 0 %
Basophils Relative: 0 %
Eosinophils Absolute: 0.1 10*3/uL (ref 0.0–0.5)
Eosinophils Absolute: 0.1 10*3/uL (ref 0.0–0.5)
Eosinophils Relative: 2 %
Eosinophils Relative: 1 %
HCT: 23.6 % — ABNORMAL LOW (ref 36.0–46.0)
HCT: 27.6 % — ABNORMAL LOW (ref 36.0–46.0)
Hemoglobin: 8 g/dL — ABNORMAL LOW (ref 12.0–15.0)
Hemoglobin: 9.4 g/dL — ABNORMAL LOW (ref 12.0–15.0)
Immature Granulocytes: 1 %
Immature Granulocytes: 1 %
Lymphocytes Relative: 29 %
Lymphocytes Relative: 18 %
Lymphs Abs: 1.5 10*3/uL (ref 0.7–4.0)
Lymphs Abs: 1.1 10*3/uL (ref 0.7–4.0)
MCH: 30.4 pg (ref 26.0–34.0)
MCH: 30.5 pg (ref 26.0–34.0)
MCHC: 33.9 g/dL (ref 30.0–36.0)
MCHC: 34.1 g/dL (ref 30.0–36.0)
MCV: 89.7 fL (ref 80.0–100.0)
MCV: 89.6 fL (ref 80.0–100.0)
Monocytes Absolute: 0.5 10*3/uL (ref 0.1–1.0)
Monocytes Absolute: 0.6 10*3/uL (ref 0.1–1.0)
Monocytes Relative: 10 %
Monocytes Relative: 9 %
Neutro Abs: 3 10*3/uL (ref 1.7–7.7)
Neutro Abs: 4.4 10*3/uL (ref 1.7–7.7)
Neutrophils Relative %: 58 %
Neutrophils Relative %: 71 %
Platelets: 123 10*3/uL — ABNORMAL LOW (ref 150–400)
Platelets: 135 10*3/uL — ABNORMAL LOW (ref 150–400)
RBC: 2.63 MIL/uL — ABNORMAL LOW (ref 3.87–5.11)
RBC: 3.08 MIL/uL — ABNORMAL LOW (ref 3.87–5.11)
RDW: 13.9 % (ref 11.5–15.5)
RDW: 13.8 % (ref 11.5–15.5)
WBC: 5.1 10*3/uL (ref 4.0–10.5)
WBC: 6.2 10*3/uL (ref 4.0–10.5)
nRBC: 0 % (ref 0.0–0.2)
nRBC: 0 % (ref 0.0–0.2)

## 2021-03-14 LAB — BASIC METABOLIC PANEL
Anion gap: 8 (ref 5–15)
BUN: 27 mg/dL — ABNORMAL HIGH (ref 6–20)
CO2: 24 mmol/L (ref 22–32)
Calcium: 8.6 mg/dL — ABNORMAL LOW (ref 8.9–10.3)
Chloride: 96 mmol/L — ABNORMAL LOW (ref 98–111)
Creatinine, Ser: 1.63 mg/dL — ABNORMAL HIGH (ref 0.44–1.00)
GFR, Estimated: 36 mL/min — ABNORMAL LOW (ref >60)
Glucose, Bld: 120 mg/dL — ABNORMAL HIGH (ref 70–99)
Potassium: 4.3 mmol/L (ref 3.5–5.1)
Sodium: 128 mmol/L — ABNORMAL LOW (ref 135–145)

## 2021-03-14 LAB — GLUCOSE, CAPILLARY
Glucose-Capillary: 149 mg/dL — ABNORMAL HIGH (ref 70–99)
Glucose-Capillary: 89 mg/dL (ref 70–99)
Glucose-Capillary: 135 mg/dL — ABNORMAL HIGH (ref 70–99)

## 2021-03-14 LAB — MAGNESIUM: Magnesium: 1.6 mg/dL — ABNORMAL LOW (ref 1.7–2.4)

## 2021-03-14 LAB — PHOSPHORUS: Phosphorus: 2.6 mg/dL (ref 2.5–4.6)

## 2021-03-14 LAB — AMMONIA: Ammonia: 44 umol/L — ABNORMAL HIGH (ref 9–35)

## 2021-03-14 MED ORDER — OXYCODONE HCL ER 15 MG PO T12A
15.0000 mg | EXTENDED_RELEASE_TABLET | Freq: Two times a day (BID) | ORAL | Status: DC
  Administered 2021-03-14 – 2021-03-17 (×7): 15 mg via ORAL
  Filled 2021-03-14 (×7): qty 1

## 2021-03-14 MED ORDER — LACTULOSE 10 GM/15ML PO SOLN: 20.0000 g | Freq: Every day | ORAL | Status: DC | PRN

## 2021-03-14 MED ORDER — MAGNESIUM SULFATE 2 GM/50ML IV SOLN
2.0000 g | Freq: Once | INTRAVENOUS | Status: AC
  Administered 2021-03-14: 2 g via INTRAVENOUS
  Filled 2021-03-14: qty 50

## 2021-03-14 MED ORDER — CHLORHEXIDINE GLUCONATE CLOTH 2 % EX PADS
6.0000 | MEDICATED_PAD | Freq: Every day | CUTANEOUS | Status: DC
  Administered 2021-03-15 – 2021-03-17 (×3): 6 via TOPICAL

## 2021-03-14 NOTE — Progress Notes (Signed)
     Subjective: 2 Days Post-Op Procedure(s) (LRB): INTRAMEDULLARY (IM) NAIL TIBIAL (Left) Awake, alert and oriented x 4. Acites removed yesterday. Left leg block has worn off and now she is painful, but also movement of the left foot and toes,motor has returned to normal. Patient reports pain as marked.    Objective:   VITALS:  Temp:  [98.5 F (36.9 C)-98.9 F (37.2 C)] 98.9 F (37.2 C) (11/07 2000) Pulse Rate:  [82-89] 89 (11/07 2000) Resp:  [16-18] 18 (11/07 2000) BP: (102-118)/(63-96) 118/96 (11/07 2000) SpO2:  [91 %-100 %] 91 % (11/07 2000)  Neurologically intact ABD soft Neurovascular intact Intact pulses distally Dorsiflexion/Plantar flexion intact Incision: dressing C/D/I and no drainage   LABS Recent Labs    03/12/21 0228 03/12/21 0442 03/13/21 0130 03/14/21 0531  HGB 10.5* 10.0* 8.5* 8.0*  WBC 4.6 5.0 4.5 5.1  PLT 144* 147* 118* 123*   Recent Labs    03/13/21 0130 03/14/21 0531  NA 130* 128*  K 6.0* 4.3  CL 98 96*  CO2 23 24  BUN 28* 27*  CREATININE 1.52* 1.63*  GLUCOSE 246* 120*   No results for input(s): LABPT, INR in the last 72 hours.   Assessment/Plan: 2 Days Post-Op Procedure(s) (LRB): INTRAMEDULLARY (IM) NAIL TIBIAL (Left) Urinary retention Anemia acute on chronic, peri op blood loss  Advance diet Up with therapy If symptomatic with anemia then may transfuse, she is relatively young, but hepatic cirrhosis and heptorenal concerns may be easier to deal with if hemaglobin is brought in line. Otherwise, volumn replacement may bring about recurrent acites and edema. Give oxycontin and oxyIR for pain. Dilaudid IV for severe pain.    Vira Browns 03/14/2021, 8:12 AM Patient ID: Mallory Stewart, female   DOB: June 14, 1960, 60 y.o.   MRN: [id number]

## 2021-03-14 NOTE — Progress Notes (Signed)
Dr. Jeanella Anton contacted regarded pt's YELLOW MEWS status. New orders were placed by Dr. Please see MAR and flowsheets.

## 2021-03-14 NOTE — Progress Notes (Addendum)
PROGRESS NOTE  Mallory Stewart  DOB: [birth year]  PCP: Excell Seltzer, MD MAU:[id number]  DOA: 03/12/2021  LOS: 2 days  Hospital Day: 3  Chief Complaint  Patient presents with   Fall   Ankle Pain    Brief narrative: Mallory Stewart is a 60 y.o. female with PMH significant for DM2, alcoholic liver cirrhosis, portal hypertension, ascites, chronic pancreatitis, vitamin B12 deficiency who lives at home. Patient presented to the ED on 11/6 after a fall at home.  Imaging showed left displaced angulated distal tibia/fibula fracture. Admitted to orthopedic service and underwent left intramedullary nailing of tibia. Medicine consultation was called for comanagement of medical issues. Patient follows up with Dr. Leone Payor GI.  She reports that few weeks ago, she was noted to have increased ascites and hence the doses of Lasix and Aldactone were increased.  In last several days, patient has been getting intermittent lightheadedness. On 11/6, while walking, she felt lightheaded, she held onto her daughter nearby and slowly slumped to the floor.  Did not pass out.  Her husband heard her in it and it immediately. Labs on admission showed potassium elevated to 5.5 with creatinine elevated 1.78 from a baseline of 0.96, blood sugar was elevated to over 300.   Subjective: Patient was seen and examined this am.  Propped up in bed.  Not in distress.  On low-flow oxygen.  I removed her oxygen supplementation and she maintain saturation 5 minutes of our conversation. Complains of severe pain on the operated leg. Labs this morning with creatinine slightly worse, potassium level improved, sodium level slightly low, ammonia level slightly elevated.  Assessment/Plan: Closed fracture of distal end of left tibia -Sp intramedullary nailing by orthopedics -Patient complains of severe pain in the operated leg.  Per orthopedic note, swelling and hematoma about the left peroneal nerve at the proximal fibula is  likely because of the weakness and pain.  -DVT prophylaxis and pain management per orthopedics team  Near syncope -It seems patient got lightheaded and gradually slumped to the floor. Her diuretics doses were increased lately.  Her admission labs showed AKI.  I think patient was getting orthostatic hypotension leading to near syncope and fall. -She has been receiving normal saline at 75 mill per hour.  Lasix has been on hold.  Creatinine slightly up again today.    AKI -Creatinine less than 1 at baseline, presented with creatinine elevated 1.78.  Gradually improved but again worsened last 24 hours.  Currently on IV fluid.  Continue to monitor. Recent Labs    08/18/20 0923 08/24/20 1228 09/23/20 0914 11/04/20 1031 12/06/20 1205 03/12/21 0228 03/12/21 0442 03/12/21 2058 03/13/21 0130 03/14/21 0531  BUN 9 17 7 12 12  32* 32* 28* 28* 27*  CREATININE 0.99 1.24* 0.93 1.15 0.96 1.78* 1.74* 1.62* 1.52* 1.63*   Hyperkalemia Hypomagnesemia -Potassium was elevated on admission to 5.5 and peaked at 6.  Improved to normal with Lokelma.  -Labs this morning with magnesium level low at 1.6.  IV replacement given. Recent Labs  Lab 03/12/21 0228 03/12/21 0442 03/12/21 2058 03/13/21 0130 03/14/21 0531  K 4.7 5.5* 5.5* 6.0* 4.3  MG  --   --   --   --  1.6*  PHOS  --   --   --   --  2.6   Alcoholic liver cirrhosis cirrhosis with ascites Essential hypertension -Home med include metoprolol 50 mg twice daily, Lasix 40 mg daily, Aldactone 100 mg daily, losartan 100 mg daily, -Continue metoprolol.  Keep Lasix, Aldactone and losartan on hold in view of AKI and hyperkalemia. -Ultrasound abdomen showed considerable abdominal ascites.  Paracentesis obtained.  5.6 L of amber-colored fluid removed. -Continue PPI. -Follows up with Dr. Leone Payor as an outpatient. -Ammonia level up today at 44.  Start lactulose Recent Labs  Lab 03/14/21 0531  AMMONIA 44*   Acute anemia -Postoperatively, hemoglobin  level dropping down, 8 this morning.  No evidence of bleeding elsewhere. -Continue to monitor Recent Labs    06/16/20 0838 08/05/20 1209 08/24/20 1228 09/29/20 1045 03/12/21 0228 03/12/21 0442 03/13/21 0130 03/14/21 0531 03/14/21 1124  HGB  --    < >  --    < > 10.5* 10.0* 8.5* 8.0* 9.4*  MCV  --    < >  --    < > 88.5 88.8 89.4 89.7 89.6  VITAMINB12 314  --   --   --   --   --   --   --   --   FERRITIN  --   --  229.2  --   --   --   --   --   --    < > = values in this interval not displayed.   Type 2 diabetes mellitus with hyperglycemia -A1c 7.9 -Home meds include Lantus 30 units daily, Jardiance 25 mg daily, -Currently on Semglee 25 units at bedtime, with sliding scale insulin. -Blood sugar level to monitor. Recent Labs  Lab 03/13/21 0818 03/13/21 1214 03/13/21 1627 03/13/21 2006 03/14/21 0811  GLUCAP 245* 177* 191* 124* 149*   Anxiety/depression -Home meds include Elavil 50 mg at bedtime, cetirizine 10 mg at bedtime, Lyrica 75 mg daily   Mobility: PT eval Living condition: Lives at home with husband Goals of care:   Code Status: Full Code  Nutritional status: Body mass index is 23.92 kg/m.     Diet:  Diet Order             Diet Carb Modified Fluid consistency: Thin; Room service appropriate? Yes  Diet effective now                  DVT prophylaxis:  Place TED hose Start: 03/12/21 1948 Foot Pump / plexipulse Start: 03/12/21 1948   Antimicrobials: None Fluid: LR@75  to continue for next 24 hours. Consultants: Orthopedics Family Communication: None at bedside  Status is: Inpatient Remains inpatient appropriate because: Needs electrolyte monitoring, PT eval pending  Dispo: The patient is from: Home              Anticipated d/c is to: Pending PT eval              Patient currently is not medically stable to d/c.   Difficult to place patient No     Infusions:   lactated ringers 75 mL/hr at 03/12/21 2210    Scheduled Meds:  amitriptyline   50 mg Oral QHS   docusate sodium  100 mg Oral BID   ferrous sulfate  325 mg Oral BID WC   insulin aspart  0-15 Units Subcutaneous TID WC   insulin aspart  0-5 Units Subcutaneous QHS   insulin glargine-yfgn  25 Units Subcutaneous QHS   loratadine  10 mg Oral Daily   multivitamin with minerals  1 tablet Oral Daily   oxyCODONE  15 mg Oral Q12H   pantoprazole  40 mg Oral BID AC   pregabalin  75 mg Oral BID    PRN meds: acetaminophen, bisacodyl, diphenhydrAMINE, HYDROmorphone (DILAUDID) injection,  lactulose, lidocaine (PF), metoCLOPramide **OR** metoCLOPramide (REGLAN) injection, ondansetron **OR** ondansetron (ZOFRAN) IV, oxyCODONE, oxyCODONE, polyethylene glycol, sodium phosphate   Antimicrobials: Anti-infectives (From admission, onward)    Start     Dose/Rate Route Frequency Ordered Stop   03/12/21 2030  ceFAZolin (ANCEF) IVPB 1 g/50 mL premix        1 g 100 mL/hr over 30 Minutes Intravenous Every 6 hours 03/12/21 1947 03/13/21 0852   03/12/21 1300  ceFAZolin (ANCEF) IVPB 2g/100 mL premix        2 g 200 mL/hr over 30 Minutes Intravenous On call to O.R. 03/12/21 1206 03/12/21 1455   03/12/21 1216  ceFAZolin (ANCEF) 2-4 GM/100ML-% IVPB       Note to Pharmacy: Launa Flight   : cabinet override      03/12/21 1216 03/12/21 1439       Objective: Vitals:   03/14/21 1210 03/14/21 1250  BP: (!) 75/51 92/63  Pulse: 75 73  Resp: 19   Temp: 98.1 F (36.7 C)   SpO2: 100%     Intake/Output Summary (Last 24 hours) at 03/14/2021 1257 Last data filed at 03/13/2021 2200 Gross per 24 hour  Intake --  Output 1350 ml  Net -1350 ml   Filed Weights   03/12/21 1231  Weight: 73.5 kg   Weight change:  Body mass index is 23.92 kg/m.   Physical Exam: General exam: Pleasant middle-aged Caucasian female.  Not in distress. Skin: No rashes, lesions or ulcers. HEENT: Atraumatic, normocephalic, no obvious bleeding Lungs: Clear to auscultation bilaterally.  CVS: Regular rate and rhythm, no  murmur GI/Abd soft, nontender, distended from ascites, bowel sound present CNS: Alert, awake, oriented times 3.  Mild tremors Psychiatry: Mood appropriate Extremities: No pedal edema, no calf tenderness.  Left leg status post surgery.  Cast on place.  Data Review: I have personally reviewed the laboratory data and studies available.  F/u labs ordered Unresulted Labs (From admission, onward)     Start     Ordered   03/15/21 0500  Hepatic function panel  Tomorrow morning,   R        03/14/21 1256   03/15/21 0500  Ammonia  Tomorrow morning,   R        03/14/21 1256   03/14/21 0500  CBC with Differential/Platelet  Daily,   R      03/13/21 1325   03/14/21 0500  Basic metabolic panel  Daily,   R      03/13/21 1325            Signed, Lorin Glass, MD [facility] 03/14/2021

## 2021-03-14 NOTE — Progress Notes (Signed)
Physical Therapy Treatment Patient Details Name: Mallory Stewart MRN: [id number] DOB: 02/05/1961 Today's Date: 03/14/2021   History of Present Illness Patient is a 60 y/o female who presents with left tib/fib fx s/p fall at home, now s/p IM nail left tibia 03/12/21. Also with AKI, hyperkalemia and cirrhosis with ascites. PMH includes HTN, DM, neuropathy.    PT Comments    Patient progressing slowly towards PT goals. Got a drop arm recliner and practiced lateral scoot transfers in both directions with Min guard assist- posterior LOB on a few occasions but able to self correct. Requires Min A for standing but has difficulty maintaining NWB status esp when fatigued. Attempted hopping however pt not able to maintain WB status. Requires more assist for SPT, which pt will have to perform to get onto toilet/BSC at home. Plan for family training tomorrow with spouse at 10 am as pt refusing rehab. Reports her nerve block has worn off and experiencing lots of pain. Will follow.   Recommendations for follow up therapy are one component of a multi-disciplinary discharge planning process, led by the attending physician.  Recommendations may be updated based on patient status, additional functional criteria and insurance authorization.  Follow Up Recommendations  Home health PT     Assistance Recommended at Discharge Frequent or constant Supervision/Assistance (for mobility)  Equipment Recommendations  None recommended by PT (has all necessary DME)    Recommendations for Other Services       Precautions / Restrictions Precautions Precautions: Fall Required Braces or Orthoses: Splint/Cast Splint/Cast: LLE Restrictions Weight Bearing Restrictions: Yes LLE Weight Bearing: Non weight bearing     Mobility  Bed Mobility Overal bed mobility: Needs Assistance Bed Mobility: Supine to Sit;Sit to Supine     Supine to sit: Supervision;HOB elevated Sit to supine: Supervision;HOB elevated   General  bed mobility comments: No assist needed, posterior bias but no LOB.    Transfers Overall transfer level: Needs assistance Equipment used: Rolling walker (2 wheels) Transfers: Sit to/from Stand Sit to Stand: Min assist;From elevated surface          Lateral/Scoot Transfers: Min guard General transfer comment: Min A to power to standing from EOB x5, difficulty maintaining NWB consistently esp when fatigued. Likes to place UEs on RW to assist instead of bed (does better this way actually), uses momentum. Lateral scoot transfer to drop arm recliner x2 in both directions with cues for technique, LOB posteriorly on as few occasions but able to self correct.    Ambulation/Gait             Pre-gait activities: Practiced hopping x2 but placing weight through LLE so deferred. General Gait Details: Deferred as pt not able to maintain NWB   Stairs             Wheelchair Mobility    Modified Rankin (Stroke Patients Only)       Balance Overall balance assessment: Needs assistance;History of Falls Sitting-balance support: Feet supported;No upper extremity supported Sitting balance-Leahy Scale: Good     Standing balance support: During functional activity Standing balance-Leahy Scale: Poor Standing balance comment: External support for upright with use of RW, unable to keep NWB status consistently.                            Cognition Arousal/Alertness: Awake/alert Behavior During Therapy: Anxious;WFL for tasks assessed/performed Overall Cognitive Status: Within Functional Limits for tasks assessed  General Comments: Despite having difficulty and needing lots of help for transfers/maintaining NWB status, pt refusing any rehab.        Exercises      General Comments General comments (skin integrity, edema, etc.): Discussed having spouse present for family training tomorrow.      Pertinent Vitals/Pain Pain  Assessment: Faces Faces Pain Scale: Hurts whole lot Pain Location: LLE Pain Descriptors / Indicators: Constant;Discomfort;Operative site guarding Pain Intervention(s): Monitored during session;Repositioned    Home Living                          Prior Function            PT Goals (current goals can now be found in the care plan section) Progress towards PT goals: Progressing toward goals    Frequency    Min 3X/week      PT Plan Current plan remains appropriate    Co-evaluation              AM-PAC PT "6 Clicks" Mobility   Outcome Measure  Help needed turning from your back to your side while in a flat bed without using bedrails?: A Little Help needed moving from lying on your back to sitting on the side of a flat bed without using bedrails?: A Little Help needed moving to and from a bed to a chair (including a wheelchair)?: A Lot Help needed standing up from a chair using your arms (e.g., wheelchair or bedside chair)?: A Lot Help needed to walk in hospital room?: Total Help needed climbing 3-5 steps with a railing? : Total 6 Click Score: 12    End of Session Equipment Utilized During Treatment: Gait belt Activity Tolerance: Patient tolerated treatment well Patient left: in bed;with call bell/phone within reach;with bed alarm set;with SCD's reapplied Nurse Communication: Mobility status PT Visit Diagnosis: Unsteadiness on feet (R26.81);Difficulty in walking, not elsewhere classified (R26.2)     Time: 8850-2774 PT Time Calculation (min) (ACUTE ONLY): 34 min  Charges:  $Therapeutic Activity: 23-37 mins                     Vale Haven, PT, DPT Acute Rehabilitation Services Pager [phone] Office [phone]      Blake Divine A Lanier Ensign 03/14/2021, 3:06 PM

## 2021-03-14 NOTE — Progress Notes (Signed)
Occupational Therapy Treatment Patient Details Name: Mallory Stewart MRN: [id number] DOB: March 23, 1961 Today's Date: 03/14/2021   History of present illness Patient is a 60 y/o female who presents with left tib/fib fx s/p fall at home, now s/p IM nail left tibia 03/12/21. Also with AKI, hyperkalemia and cirrhosis with ascites. PMH includes HTN, DM, neuropathy.   OT comments  Pt min guard/min assist for bed mobility, one mild posterior LOB episode when attempting to scoot forward at EOB. Pt states she is less anxious about transfers after working with OT and mobility tech yesterday. Pt able to stand upright posture after 1 sit-to-stand attempt with Mod A using RW, pt requires mod verbal cuing to adhere to precautions. Pt moves impulsively during stand-pivot transfer to chair, despite verbal cues, pt attempting to reach for recliner and front of RW instead of handle. Pt rates her LLE pain 10/10 throughout session, limiting her ability to mobilize. Pt states she is feeling mild dizziness, VSS, nurse notified. Pt repositioned in chair with LLE elevated and ice applied. Spouse expressed concerns of caring for pt at d/c if she remains Mod A for transfers. Pt with increased pain, impaired balance, strength, activity tolerance, and ROM at this time. Will continue to follow acutely, pt is primarily limited by pain at this time, discussed pain management options with RN/MD. Recommend pt will need postacute OT at d/c.   Recommendations for follow up therapy are one component of a multi-disciplinary discharge planning process, led by the attending physician.  Recommendations may be updated based on patient status, additional functional criteria and insurance authorization.    Follow Up Recommendations  Home health OT    Assistance Recommended at Discharge Frequent or constant Supervision/Assistance  Equipment Recommendations  None recommended by OT    Recommendations for Other Services PT consult     Precautions / Restrictions Precautions Precautions: Fall Required Braces or Orthoses: Splint/Cast Splint/Cast: LLE Restrictions Weight Bearing Restrictions: Yes LLE Weight Bearing: Non weight bearing       Mobility Bed Mobility Overal bed mobility: Needs Assistance Bed Mobility: Supine to Sit     Supine to sit: Min guard    General bed mobility comments: pt having difficulty scooting forward when sitting EOB, had mild posterior LOB    Transfers Overall transfer level: Needs assistance Equipment used: Rolling walker (2 wheels) Transfers: Bed to chair/wheelchair/BSC;Sit to/from Stand Sit to Stand: Mod assist;From elevated surface Stand pivot transfers: Mod assist;From elevated surface         General transfer comment: pt able to maintain upright posture using RW     Balance Overall balance assessment: Needs assistance;History of Falls Sitting-balance support: Feet supported;No upper extremity supported Sitting balance-Leahy Scale: Good     Standing balance support: During functional activity;Reliant on assistive device for balance Standing balance-Leahy Scale: Poor                             ADL either performed or assessed with clinical judgement   ADL Overall ADL's : Needs assistance/impaired Eating/Feeding: Set up;Sitting   Grooming: Set up;Sitting   Upper Body Bathing: Minimal assistance;Sitting   Lower Body Bathing: Moderate assistance;Sitting/lateral leans   Upper Body Dressing : Min guard;Sitting   Lower Body Dressing: Sitting/lateral leans;Moderate assistance   Toilet Transfer: Maximal assistance;Stand-pivot Toilet Transfer Details (indicate cue type and reason): simulated with RW while adhering to NWB LLE precautions Toileting- Clothing Manipulation and Hygiene: Sitting/lateral lean;Maximal assistance  General ADL Comments: Pt with decreased anxiety compared to evaluation, however moves impulsively during transfers,  required moderate cuing reminders to adhere to NWB LLE precautions    Extremity/Trunk Assessment Upper Extremity Assessment Upper Extremity Assessment: Overall WFL for tasks assessed   Lower Extremity Assessment Lower Extremity Assessment: Defer to PT evaluation        Vision   Vision Assessment?: No apparent visual deficits   Perception Perception Perception: Within Functional Limits   Praxis Praxis Praxis: Intact    Cognition Arousal/Alertness: Awake/alert Behavior During Therapy: Anxious;WFL for tasks assessed/performed;Impulsive Overall Cognitive Status: Within Functional Limits for tasks assessed                                 General Comments: Pt displays impulsive behavior during transfers during session despite verbal cuing for safety          Exercises     Shoulder Instructions       General Comments spouse verbalized concerns with assisting pt after d/c if she remains at Mod A for transfers    Pertinent Vitals/ Pain       Pain Assessment: Faces Pain Score: 10-Worst pain ever Faces Pain Scale: Hurts worst Pain Location: LLE Pain Descriptors / Indicators: Constant;Discomfort Pain Intervention(s): Premedicated before session;Limited activity within patient's tolerance;Monitored during session;Ice applied;Repositioned  Home Living                                          Prior Functioning/Environment              Frequency  Min 3X/week        Progress Toward Goals  OT Goals(current goals can now be found in the care plan section)  Progress towards OT goals: Progressing toward goals  Acute Rehab OT Goals Patient Stated Goal: "return home" OT Goal Formulation: With patient/family Time For Goal Achievement: 03/27/21 Potential to Achieve Goals: Fair ADL Goals Pt Will Perform Lower Body Dressing: with min assist;sitting/lateral leans Pt Will Transfer to Toilet: with min assist;bedside commode Pt Will  Perform Tub/Shower Transfer: with min assist;ambulating;shower seat  Plan Frequency needs to be updated;Discharge plan needs to be updated    Co-evaluation                 AM-PAC OT "6 Clicks" Daily Activity     Outcome Measure   Help from another person eating meals?: None Help from another person taking care of personal grooming?: A Little Help from another person toileting, which includes using toliet, bedpan, or urinal?: A Lot Help from another person bathing (including washing, rinsing, drying)?: A Lot Help from another person to put on and taking off regular upper body clothing?: A Little Help from another person to put on and taking off regular lower body clothing?: A Lot 6 Click Score: 16    End of Session Equipment Utilized During Treatment: Gait belt;Rolling walker (2 wheels)  OT Visit Diagnosis: Unsteadiness on feet (R26.81);Other abnormalities of gait and mobility (R26.89);Pain;Muscle weakness (generalized) (M62.81);History of falling (Z91.81) Pain - Right/Left: Left Pain - part of body: Leg   Activity Tolerance Patient limited by pain   Patient Left in chair;with chair alarm set;with family/visitor present   Nurse Communication Mobility status;Other (comment) (reported pt dizziness at end of session)        Time: [phone]  OT Time Calculation (min): 40 min  Charges: OT General Charges $OT Visit: 1 Visit OT Treatments $Self Care/Home Management : 38-52 mins  Alfonzo Beers, OTD, OTR/L Acute Rehab [phone]) [phone]   Mayer Masker 03/14/2021, 11:49 AM

## 2021-03-15 DIAGNOSIS — S82302A Unspecified fracture of lower end of left tibia, initial encounter for closed fracture: Secondary | ICD-10-CM | POA: Diagnosis not present

## 2021-03-15 LAB — BASIC METABOLIC PANEL
Anion gap: 7 (ref 5–15)
BUN: 23 mg/dL — ABNORMAL HIGH (ref 6–20)
CO2: 26 mmol/L (ref 22–32)
Calcium: 8.7 mg/dL — ABNORMAL LOW (ref 8.9–10.3)
Chloride: 96 mmol/L — ABNORMAL LOW (ref 98–111)
Creatinine, Ser: 1.25 mg/dL — ABNORMAL HIGH (ref 0.44–1.00)
GFR, Estimated: 50 mL/min — ABNORMAL LOW (ref >60)
Glucose, Bld: 108 mg/dL — ABNORMAL HIGH (ref 70–99)
Potassium: 4.9 mmol/L (ref 3.5–5.1)
Sodium: 129 mmol/L — ABNORMAL LOW (ref 135–145)

## 2021-03-15 LAB — HEPATIC FUNCTION PANEL
ALT: 20 U/L (ref 0–44)
AST: 71 U/L — ABNORMAL HIGH (ref 15–41)
Albumin: 2.4 g/dL — ABNORMAL LOW (ref 3.5–5.0)
Alkaline Phosphatase: 249 U/L — ABNORMAL HIGH (ref 38–126)
Bilirubin, Direct: 0.4 mg/dL — ABNORMAL HIGH (ref 0.0–0.2)
Indirect Bilirubin: 0.8 mg/dL (ref 0.3–0.9)
Total Bilirubin: 1.2 mg/dL (ref 0.3–1.2)
Total Protein: 5.5 g/dL — ABNORMAL LOW (ref 6.5–8.1)

## 2021-03-15 LAB — CBC WITH DIFFERENTIAL/PLATELET
Abs Immature Granulocytes: 0.02 10*3/uL (ref 0.00–0.07)
Basophils Absolute: 0 10*3/uL (ref 0.0–0.1)
Basophils Relative: 0 %
Eosinophils Absolute: 0.1 10*3/uL (ref 0.0–0.5)
Eosinophils Relative: 2 %
HCT: 27.1 % — ABNORMAL LOW (ref 36.0–46.0)
Hemoglobin: 8.9 g/dL — ABNORMAL LOW (ref 12.0–15.0)
Immature Granulocytes: 0 %
Lymphocytes Relative: 29 %
Lymphs Abs: 1.3 10*3/uL (ref 0.7–4.0)
MCH: 30.1 pg (ref 26.0–34.0)
MCHC: 32.8 g/dL (ref 30.0–36.0)
MCV: 91.6 fL (ref 80.0–100.0)
Monocytes Absolute: 0.6 10*3/uL (ref 0.1–1.0)
Monocytes Relative: 13 %
Neutro Abs: 2.6 10*3/uL (ref 1.7–7.7)
Neutrophils Relative %: 56 %
Platelets: 126 10*3/uL — ABNORMAL LOW (ref 150–400)
RBC: 2.96 MIL/uL — ABNORMAL LOW (ref 3.87–5.11)
RDW: 13.8 % (ref 11.5–15.5)
WBC: 4.7 10*3/uL (ref 4.0–10.5)
nRBC: 0 % (ref 0.0–0.2)

## 2021-03-15 LAB — GLUCOSE, CAPILLARY
Glucose-Capillary: 135 mg/dL — ABNORMAL HIGH (ref 70–99)
Glucose-Capillary: 133 mg/dL — ABNORMAL HIGH (ref 70–99)
Glucose-Capillary: 155 mg/dL — ABNORMAL HIGH (ref 70–99)
Glucose-Capillary: 153 mg/dL — ABNORMAL HIGH (ref 70–99)
Glucose-Capillary: 118 mg/dL — ABNORMAL HIGH (ref 70–99)

## 2021-03-15 LAB — AMMONIA: Ammonia: 25 umol/L (ref 9–35)

## 2021-03-15 MED ORDER — ALPRAZOLAM 0.25 MG PO TABS: 0.2500 mg | ORAL_TABLET | Freq: Three times a day (TID) | ORAL | Status: DC | PRN

## 2021-03-15 MED ORDER — ASPIRIN EC 81 MG PO TBEC
81.0000 mg | DELAYED_RELEASE_TABLET | Freq: Two times a day (BID) | ORAL | Status: DC
  Administered 2021-03-15 – 2021-03-17 (×4): 81 mg via ORAL
  Filled 2021-03-15 (×4): qty 1

## 2021-03-15 NOTE — Progress Notes (Signed)
PROGRESS NOTE  Mallory Stewart  DOB: 1960-11-25  PCP: Excell Seltzer, MD HQI:[id number]  DOA: 03/12/2021  LOS: 3 days  Hospital Day: 4  Chief Complaint  Patient presents with   Fall   Ankle Pain    Brief narrative: Mallory Stewart is a 60 y.o. female with PMH significant for DM2, alcoholic liver cirrhosis, portal hypertension, ascites, chronic pancreatitis, vitamin B12 deficiency who lives at home. Patient presented to the ED on 11/6 after a fall at home.  Imaging showed left displaced angulated distal tibia/fibula fracture. Admitted to orthopedic service and underwent left intramedullary nailing of tibia. Medicine consultation was called for comanagement of medical issues. Patient follows up with Dr. Leone Payor GI.  She reports that few weeks ago, she was noted to have increased ascites and hence the doses of Lasix and Aldactone were increased.  In last several days, patient has been getting intermittent lightheadedness. On 11/6, while walking, she felt lightheaded, she held onto her daughter nearby and slowly slumped to the floor.  Did not pass out.  Her husband heard her in it and it immediately. Labs on admission showed potassium elevated to 5.5 with creatinine elevated 1.78 from a baseline of 0.96, blood sugar was elevated to over 300.   Subjective: Patient was seen and examined this am.  Propped up in bed.  Not in distress.  No new symptoms.  Not on supplemental oxygen. Yesterday patient had orthostatic dizziness.  IV fluid was continued. Blood pressure low again this morning but she does not feel symptoms.  Pending PT eval.  Assessment/Plan: Closed fracture of distal end of left tibia -Sp intramedullary nailing by orthopedics -Orthopedics following.  Patient continues to complain of pain on the operated leg. Per orthopedic note, swelling and hematoma about the left peroneal nerve at the proximal fibula is likely because of the weakness and pain.  -DVT prophylaxis and pain  management per orthopedics team  Near syncope Orthostatic hypotension -Per history, prior to admission, it seems patient got lightheaded and gradually slumped to the floor. Her diuretics doses were increased lately.  Her admission labs showed AKI.  I think patient was getting orthostatic hypotension leading to near syncope and fall. -Patient had orthostatic hypotension in the hospital as well on 11/8.  IV fluid was continued.  Lasix remains on hold.  AKI -Creatinine less than 1 at baseline, presented with creatinine elevated 1.78.  Gradually improving with IV fluid, 1.25 today. Recent Labs    08/24/20 1228 09/23/20 0914 11/04/20 1031 12/06/20 1205 03/12/21 0228 03/12/21 0442 03/12/21 2058 03/13/21 0130 03/14/21 0531 03/15/21 0236  BUN 17 7 12 12  32* 32* 28* 28* 27* 23*  CREATININE 1.24* 0.93 1.15 0.96 1.78* 1.74* 1.62* 1.52* 1.63* 1.25*   Hyperkalemia Hypomagnesemia -Potassium was elevated on admission to 5.5 and peaked at 6.  Improved to normal with Lokelma.  -Magnesium level on 11/8 was low at 1.6.  Replacement given. Recent Labs  Lab 03/12/21 0442 03/12/21 2058 03/13/21 0130 03/14/21 0531 03/15/21 0236  K 5.5* 5.5* 6.0* 4.3 4.9  MG  --   --   --  1.6*  --   PHOS  --   --   --  2.6  --    Alcoholic liver cirrhosis cirrhosis with ascites Essential hypertension -Home med include metoprolol 50 mg twice daily, Lasix 40 mg daily, Aldactone 100 mg daily, losartan 100 mg daily. -Because of orthostatic hypotension, currently all the medications are on hold. -Ultrasound abdomen showed considerable abdominal ascites.  On 11/7, 5.6 L of amber-colored fluid removed with paracentesis. -Continue PPI. -Follows up with Dr. Leone Payor as an outpatient. -Ammonia level was up at 44 on 11/8.  Started lactulose. Recent Labs  Lab 03/14/21 0531 03/15/21 0236  AMMONIA 44* 25   Acute anemia -Postoperatively, hemoglobin level dropped down.  Currently between 8 and 9.  No active bleeding  elsewhere noted. -Continue to monitor Recent Labs    06/16/20 0838 08/05/20 1209 08/24/20 1228 09/29/20 1045 03/12/21 0442 03/13/21 0130 03/14/21 0531 03/14/21 1124 03/15/21 0236  HGB  --    < >  --    < > 10.0* 8.5* 8.0* 9.4* 8.9*  MCV  --    < >  --    < > 88.8 89.4 89.7 89.6 91.6  VITAMINB12 314  --   --   --   --   --   --   --   --   FERRITIN  --   --  229.2  --   --   --   --   --   --    < > = values in this interval not displayed.   Type 2 diabetes mellitus with hyperglycemia -A1c 7.9 -Home meds include Lantus 30 units daily, Jardiance 25 mg daily, -Currently on Semglee 25 units at bedtime, with sliding scale insulin. -Blood sugar level to monitor. Recent Labs  Lab 03/13/21 2006 03/14/21 0811 03/14/21 1627 03/14/21 2028 03/15/21 0835  GLUCAP 124* 149* 89 135* 133*   Anxiety/depression -Home meds include Elavil 50 mg at bedtime, cetirizine 10 mg at bedtime, Lyrica 75 mg daily -Because of hypotension, I stopped cetirizine and Lyrica.   Mobility: PT eval Living condition: Lives at home with husband Goals of care:   Code Status: Full Code  Nutritional status: Body mass index is 23.92 kg/m.     Diet:  Diet Order             Diet Carb Modified Fluid consistency: Thin; Room service appropriate? Yes  Diet effective now                  DVT prophylaxis:  Place TED hose Start: 03/12/21 1948 Foot Pump / plexipulse Start: 03/12/21 1948   Antimicrobials: None Fluid: LR@75  to continue for another 24 hours. Consultants: Orthopedics Family Communication: None at bedside  Status is: Inpatient Remains inpatient appropriate because: Unable to discharge because of orthostatic hypotension.  Continues to require fluid.  Dispo: The patient is from: Home              Anticipated d/c is to: PT eval obtained.  Home health PT recommended.              Patient currently is not medically stable to d/c.   Difficult to place patient No     Infusions:   lactated  ringers 75 mL/hr at 03/15/21 0243    Scheduled Meds:  amitriptyline  50 mg Oral QHS   Chlorhexidine Gluconate Cloth  6 each Topical Q0600   docusate sodium  100 mg Oral BID   ferrous sulfate  325 mg Oral BID WC   insulin aspart  0-15 Units Subcutaneous TID WC   insulin aspart  0-5 Units Subcutaneous QHS   insulin glargine-yfgn  25 Units Subcutaneous QHS   multivitamin with minerals  1 tablet Oral Daily   oxyCODONE  15 mg Oral Q12H   pantoprazole  40 mg Oral BID AC   pregabalin  75 mg Oral BID  PRN meds: acetaminophen, bisacodyl, diphenhydrAMINE, HYDROmorphone (DILAUDID) injection, lactulose, lidocaine (PF), metoCLOPramide **OR** metoCLOPramide (REGLAN) injection, ondansetron **OR** ondansetron (ZOFRAN) IV, oxyCODONE, oxyCODONE, polyethylene glycol, sodium phosphate   Antimicrobials: Anti-infectives (From admission, onward)    Start     Dose/Rate Route Frequency Ordered Stop   03/12/21 2030  ceFAZolin (ANCEF) IVPB 1 g/50 mL premix        1 g 100 mL/hr over 30 Minutes Intravenous Every 6 hours 03/12/21 1947 03/13/21 0852   03/12/21 1300  ceFAZolin (ANCEF) IVPB 2g/100 mL premix        2 g 200 mL/hr over 30 Minutes Intravenous On call to O.R. 03/12/21 1206 03/12/21 1455   03/12/21 1216  ceFAZolin (ANCEF) 2-4 GM/100ML-% IVPB       Note to Pharmacy: Launa Flight   : cabinet override      03/12/21 1216 03/12/21 1439       Objective: Vitals:   03/15/21 0519 03/15/21 0816  BP: (!) 87/57 92/78  Pulse: 88 96  Resp: 18 18  Temp: 97.8 F (36.6 C) 98.1 F (36.7 C)  SpO2: 98% 96%    Intake/Output Summary (Last 24 hours) at 03/15/2021 1017 Last data filed at 03/15/2021 0243 Gross per 24 hour  Intake 1823.4 ml  Output 3600 ml  Net -1776.6 ml   Filed Weights   03/12/21 1231  Weight: 73.5 kg   Weight change:  Body mass index is 23.92 kg/m.   Physical Exam: General exam: Pleasant middle-aged Caucasian female.  Not in distress Skin: No rashes, lesions or ulcers. HEENT:  Atraumatic, normocephalic, no obvious bleeding Lungs: Clear to auscultation bilaterally CVS: Regular rate and rhythm, no murmur GI/Abd soft, nontender, distended from ascites, bowel sound present CNS: Alert, awake, oriented times 3.  Continues to have mild tremors. Psychiatry: Mood appropriate Extremities: No pedal edema, no calf tenderness.  Left leg status post surgery.  Cast on place.  Data Review: I have personally reviewed the laboratory data and studies available.  F/u labs ordered Unresulted Labs (From admission, onward)     Start     Ordered   03/14/21 0500  CBC with Differential/Platelet  Daily,   R      03/13/21 1325   03/14/21 0500  Basic metabolic panel  Daily,   R      03/13/21 1325            Signed, Lorin Glass, MD [facility] 03/15/2021

## 2021-03-15 NOTE — Progress Notes (Signed)
Mobility Specialist Progress Note   03/15/21 1539  Mobility  Activity Ambulated in room  Level of Assistance Minimal assist, patient does 75% or more  Assistive Device Front wheel walker  LLE Weight Bearing NWB  Distance Ambulated (ft) 4 ft  Mobility Ambulated with assistance in room  Mobility Response Tolerated well  Mobility performed by Mobility specialist  $Mobility charge 1 Mobility   Received pt in bed having no complaints and agreeable to mobility. Mod I supine to EOB, contactG for STS and minA for ambulation d/t deficit in balance. Pt still needs heavy VC for WB precautions throughout transfer. Session cut short d/t IV leaking from pt's wrist. RN notified.  Frederico Hamman Mobility Specialist Phone Number [phone]

## 2021-03-15 NOTE — Progress Notes (Signed)
Physical Therapy Treatment Patient Details Name: Mallory Stewart MRN: [id number] DOB: January 08, 1961 Today's Date: 03/15/2021   History of Present Illness Patient is a 60 y/o female who presents with left tib/fib fx s/p fall at home, now s/p IM nail left tibia 03/12/21. Also with AKI, hyperkalemia and cirrhosis with ascites. PMH includes HTN, DM, neuropathy.    PT Comments    Continuing work on functional mobility and activity tolerance;  Session focused on lateral scoot transfers, with performing lateral scoots to R and L, and to differing heights; We discussed using this massed practice with the goal of being able to generalized this lateral scoot technqiue to a variety of situations she will need to adjust to at home/to go home (car transfers, for instance); Performed scoot transfers multiple times, even while in a considerable amount of pain; Spouse present and engaged in problem-solving as well; Pt already has some know-how for wheelchair use  Recommendations for follow up therapy are one component of a multi-disciplinary discharge planning process, led by the attending physician.  Recommendations may be updated based on patient status, additional functional criteria and insurance authorization.  Follow Up Recommendations  Home health PT     Assistance Recommended at Discharge Frequent or constant Supervision/Assistance (for mobility)  Equipment Recommendations  Other (comment) (drop-arm BSC and sliding board)    Recommendations for Other Services OT consult     Precautions / Restrictions Precautions Precautions: Fall Required Braces or Orthoses: Splint/Cast Splint/Cast: LLE Restrictions Weight Bearing Restrictions: Yes LLE Weight Bearing: Non weight bearing     Mobility  Bed Mobility Overal bed mobility: Needs Assistance Bed Mobility: Supine to Sit     Supine to sit: Supervision;HOB elevated     General bed mobility comments: Sitting EOB upon PT arrival     Transfers Overall transfer level: Needs assistance Equipment used: Rolling walker (2 wheels) Transfers: Bed to chair/wheelchair/BSC Sit to Stand: Min assist;From elevated surface          Lateral/Scoot Transfers: Min guard (withand without physical contact) General transfer comment: Occasional cues to keep LLE up and off the floor; perfomred lateral scoot transfers bed to wheelchair (with sliding board), then wheelchair to shower seat, then shower seat to [facility] (non-drop arm, with quarter turn to partially back in); all of the previous transfers to the Right; then transferred back to shower seat on L, and lastly back to recliner    Ambulation/Gait                   Dance movement psychotherapist Wheelchair mobility: Yes Wheelchair propulsion: Both upper extremities;Right lower extremity Wheelchair parts: Needs assistance Distance:  (small, detailed movement of wheelchair for positioning in room for transfers) Wheelchair Assistance Details (indicate cue type and reason): min  Modified Rankin (Stroke Patients Only)       Balance Overall balance assessment: Needs assistance;History of Falls Sitting-balance support: Feet supported;No upper extremity supported Sitting balance-Leahy Scale: Good     Standing balance support: During functional activity Standing balance-Leahy Scale: Poor                              Cognition Arousal/Alertness: Awake/alert Behavior During Therapy: Restless;Anxious;WFL for tasks assessed/performed Overall Cognitive Status: Within Functional Limits for tasks assessed  Exercises      General Comments General comments (skin integrity, edema, etc.): Spouse present for family ed, and participated in problem-solving for home management; we discussed positioning for elevation at home      Pertinent Vitals/Pain Pain Assessment:  Faces Pain Score: 7  Faces Pain Scale: Hurts whole lot Pain Location: LLE Pain Descriptors / Indicators: Constant;Discomfort;Operative site guarding Pain Intervention(s): Monitored during session;Premedicated before session    Home Living                          Prior Function            PT Goals (current goals can now be found in the care plan section) Acute Rehab PT Goals Patient Stated Goal: to go home Progress towards PT goals: Progressing toward goals    Frequency    Min 3X/week      PT Plan Current plan remains appropriate;Equipment recommendations need to be updated    Co-evaluation              AM-PAC PT "6 Clicks" Mobility   Outcome Measure  Help needed turning from your back to your side while in a flat bed without using bedrails?: A Little Help needed moving from lying on your back to sitting on the side of a flat bed without using bedrails?: A Little Help needed moving to and from a bed to a chair (including a wheelchair)?: A Little Help needed standing up from a chair using your arms (e.g., wheelchair or bedside chair)?: A Lot Help needed to walk in hospital room?: Total Help needed climbing 3-5 steps with a railing? : Total 6 Click Score: 13    End of Session Equipment Utilized During Treatment: Gait belt Activity Tolerance: Patient tolerated treatment well Patient left: in chair;with call bell/phone within reach Nurse Communication: Mobility status PT Visit Diagnosis: Unsteadiness on feet (R26.81);Difficulty in walking, not elsewhere classified (R26.2)     Time: 5170-0174 PT Time Calculation (min) (ACUTE ONLY): 60 min  Charges:  $Therapeutic Activity: 53-67 mins                     Van Clines, PT  Acute Rehabilitation Services Pager [phone] Office [phone]    Mallory Stewart 03/15/2021, 1:49 PM

## 2021-03-15 NOTE — Progress Notes (Signed)
Occupational Therapy Treatment Patient Details Name: Mallory Stewart MRN: [id number] DOB: 1960-07-08 Today's Date: 03/15/2021   History of present illness Patient is a 60 y/o female who presents with left tib/fib fx s/p fall at home, now s/p IM nail left tibia 03/12/21. Also with AKI, hyperkalemia and cirrhosis with ascites. PMH includes HTN, DM, neuropathy.   OT comments  Pt seen today to work on functional transfers, spouse at bedside. Pt attempted sit-to-stand transfer x1 with RW from elevated surface with min A, often using rocking motion to propel body forward for stand. Educated pt on keeping LLE off of floor to adhere to WB precautions. Pt tearful and anxious during session, required increased motivation, however able to complete simulated bed <> drop-arm BSC lateral scoot transfer with min guard. Educated pt on bathing at bed level for safety. Recommend drop-arm 3-in-1/BSC vs. Standard 3-in-1/BSC at d/c for safety. Pt still presenting with decreased balance, activity tolerance, ROM, and pain. Will continue to follow acutely, recommend d/c home with HHOT.   Recommendations for follow up therapy are one component of a multi-disciplinary discharge planning process, led by the attending physician.  Recommendations may be updated based on patient status, additional functional criteria and insurance authorization.    Follow Up Recommendations  Home health OT    Assistance Recommended at Discharge Frequent or constant Supervision/Assistance  Equipment Recommendations  Other (comment) (Drop arm BSC/3-in-1)    Recommendations for Other Services PT consult    Precautions / Restrictions Precautions Precautions: Fall Required Braces or Orthoses: Splint/Cast Splint/Cast: LLE Restrictions Weight Bearing Restrictions: Yes LLE Weight Bearing: Non weight bearing       Mobility Bed Mobility Overal bed mobility: Needs Assistance Bed Mobility: Supine to Sit     Supine to sit:  Supervision;HOB elevated          Transfers Overall transfer level: Needs assistance Equipment used: Rolling walker (2 wheels) Transfers: Bed to chair/wheelchair/BSC Sit to Stand: Min assist;From elevated surface          Lateral/Scoot Transfers: Min guard (with verbal cuing for hand placement) General transfer comment: pt often rocking to move from sit to stand, educated pt on keeping LLE off floor when rocking to adhere to NWB precautions     Balance Overall balance assessment: Needs assistance;History of Falls Sitting-balance support: Feet supported;No upper extremity supported Sitting balance-Leahy Scale: Good     Standing balance support: During functional activity Standing balance-Leahy Scale: Poor                             ADL either performed or assessed with clinical judgement   ADL Overall ADL's : Needs assistance/impaired Eating/Feeding: Set up;Sitting   Grooming: Set up;Sitting   Upper Body Bathing: Minimal assistance;Sitting   Lower Body Bathing: Moderate assistance;Sitting/lateral leans   Upper Body Dressing : Min guard;Sitting   Lower Body Dressing: Sitting/lateral leans;Moderate assistance   Toilet Transfer: Stand-pivot;Moderate assistance;Rolling walker (2 wheels);BSC/3in1 Toilet Transfer Details (indicate cue type and reason): attempted x1 with RW, attempted x2 with lateral scooting method           General ADL Comments: pt much more comfortable/safe using lateral scooting method compared to stand/pivot transfer at this time.    Extremity/Trunk Assessment Upper Extremity Assessment Upper Extremity Assessment: Overall WFL for tasks assessed   Lower Extremity Assessment Lower Extremity Assessment: Defer to PT evaluation        Vision   Vision Assessment?: No apparent visual  deficits   Perception Perception Perception: Within Functional Limits   Praxis Praxis Praxis: Intact    Cognition Arousal/Alertness:  Awake/alert Behavior During Therapy: Restless;Anxious;WFL for tasks assessed/performed Overall Cognitive Status: Within Functional Limits for tasks assessed                                            Exercises     Shoulder Instructions       General Comments spouse present for family education session    Pertinent Vitals/ Pain       Pain Assessment: Faces Pain Score: 7  Faces Pain Scale: Hurts whole lot Pain Location: LLE Pain Descriptors / Indicators: Constant;Discomfort;Operative site guarding Pain Intervention(s): Limited activity within patient's tolerance;Monitored during session;Repositioned;Relaxation  Home Living                                          Prior Functioning/Environment              Frequency  Min 3X/week        Progress Toward Goals  OT Goals(current goals can now be found in the care plan section)  Progress towards OT goals: Progressing toward goals  Acute Rehab OT Goals Patient Stated Goal: "return home" OT Goal Formulation: With patient/family Time For Goal Achievement: 03/27/21 Potential to Achieve Goals: Fair ADL Goals Pt Will Perform Lower Body Dressing: with min assist;sitting/lateral leans Pt Will Transfer to Toilet: with min assist;bedside commode Pt Will Perform Tub/Shower Transfer: with min assist;ambulating;shower seat  Plan Discharge plan remains appropriate;Frequency remains appropriate    Co-evaluation                 AM-PAC OT "6 Clicks" Daily Activity     Outcome Measure   Help from another person eating meals?: None Help from another person taking care of personal grooming?: A Little Help from another person toileting, which includes using toliet, bedpan, or urinal?: A Lot Help from another person bathing (including washing, rinsing, drying)?: A Lot Help from another person to put on and taking off regular upper body clothing?: A Little Help from another person to put on  and taking off regular lower body clothing?: A Little 6 Click Score: 17    End of Session Equipment Utilized During Treatment: Gait belt;Rolling walker (2 wheels)  OT Visit Diagnosis: Unsteadiness on feet (R26.81);Other abnormalities of gait and mobility (R26.89);Pain;Muscle weakness (generalized) (M62.81);History of falling (Z91.81) Pain - Right/Left: Left Pain - part of body: Leg   Activity Tolerance Patient limited by pain   Patient Left in bed;with family/visitor present;Other (comment) (pt sitting EOB, handoff to PT)   Nurse Communication Mobility status;Other (comment) (asked RN/MD about anxiety medications for pt, also notified social work about drop arm BSC for safe d/c home)        Time: 1610-9604 OT Time Calculation (min): 26 min  Charges: OT General Charges $OT Visit: 1 Visit OT Treatments $Self Care/Home Management : 23-37 mins  Alfonzo Beers, OTD, OTR/L Acute Rehab [phone]) [phone]   Mayer Masker 03/15/2021, 12:44 PM

## 2021-03-16 ENCOUNTER — Other Ambulatory Visit: Payer: Self-pay

## 2021-03-16 ENCOUNTER — Telehealth: Payer: Self-pay

## 2021-03-16 DIAGNOSIS — S82302A Unspecified fracture of lower end of left tibia, initial encounter for closed fracture: Secondary | ICD-10-CM | POA: Diagnosis not present

## 2021-03-16 DIAGNOSIS — R932 Abnormal findings on diagnostic imaging of liver and biliary tract: Secondary | ICD-10-CM

## 2021-03-16 DIAGNOSIS — I8510 Secondary esophageal varices without bleeding: Secondary | ICD-10-CM | POA: Diagnosis not present

## 2021-03-16 DIAGNOSIS — K7031 Alcoholic cirrhosis of liver with ascites: Secondary | ICD-10-CM | POA: Diagnosis not present

## 2021-03-16 DIAGNOSIS — K760 Fatty (change of) liver, not elsewhere classified: Secondary | ICD-10-CM

## 2021-03-16 LAB — CBC WITH DIFFERENTIAL/PLATELET
Abs Immature Granulocytes: 0.03 10*3/uL (ref 0.00–0.07)
Basophils Absolute: 0 10*3/uL (ref 0.0–0.1)
Basophils Relative: 0 %
Eosinophils Absolute: 0.1 10*3/uL (ref 0.0–0.5)
Eosinophils Relative: 3 %
HCT: 26.7 % — ABNORMAL LOW (ref 36.0–46.0)
Hemoglobin: 8.8 g/dL — ABNORMAL LOW (ref 12.0–15.0)
Immature Granulocytes: 1 %
Lymphocytes Relative: 24 %
Lymphs Abs: 0.9 10*3/uL (ref 0.7–4.0)
MCH: 30 pg (ref 26.0–34.0)
MCHC: 33 g/dL (ref 30.0–36.0)
MCV: 91.1 fL (ref 80.0–100.0)
Monocytes Absolute: 0.6 10*3/uL (ref 0.1–1.0)
Monocytes Relative: 15 %
Neutro Abs: 2.3 10*3/uL (ref 1.7–7.7)
Neutrophils Relative %: 57 %
Platelets: 116 10*3/uL — ABNORMAL LOW (ref 150–400)
RBC: 2.93 MIL/uL — ABNORMAL LOW (ref 3.87–5.11)
RDW: 13.8 % (ref 11.5–15.5)
WBC: 4 10*3/uL (ref 4.0–10.5)
nRBC: 0 % (ref 0.0–0.2)

## 2021-03-16 LAB — BASIC METABOLIC PANEL
Anion gap: 7 (ref 5–15)
BUN: 21 mg/dL — ABNORMAL HIGH (ref 6–20)
CO2: 23 mmol/L (ref 22–32)
Calcium: 8.6 mg/dL — ABNORMAL LOW (ref 8.9–10.3)
Chloride: 97 mmol/L — ABNORMAL LOW (ref 98–111)
Creatinine, Ser: 1.18 mg/dL — ABNORMAL HIGH (ref 0.44–1.00)
GFR, Estimated: 54 mL/min — ABNORMAL LOW (ref >60)
Glucose, Bld: 98 mg/dL (ref 70–99)
Potassium: 4.9 mmol/L (ref 3.5–5.1)
Sodium: 127 mmol/L — ABNORMAL LOW (ref 135–145)

## 2021-03-16 LAB — PROTIME-INR
INR: 1.5 — ABNORMAL HIGH (ref 0.8–1.2)
Prothrombin Time: 17.8 seconds — ABNORMAL HIGH (ref 11.4–15.2)

## 2021-03-16 LAB — GLUCOSE, CAPILLARY
Glucose-Capillary: 110 mg/dL — ABNORMAL HIGH (ref 70–99)
Glucose-Capillary: 117 mg/dL — ABNORMAL HIGH (ref 70–99)
Glucose-Capillary: 92 mg/dL (ref 70–99)
Glucose-Capillary: 92 mg/dL (ref 70–99)

## 2021-03-16 MED ORDER — METOPROLOL TARTRATE 25 MG PO TABS
25.0000 mg | ORAL_TABLET | Freq: Two times a day (BID) | ORAL | Status: DC
  Administered 2021-03-16 – 2021-03-17 (×3): 25 mg via ORAL
  Filled 2021-03-16 (×3): qty 1

## 2021-03-16 NOTE — Progress Notes (Signed)
Occupational Therapy Treatment Patient Details Name: Mallory Stewart MRN: [id number] DOB: 04/07/1961 Today's Date: 03/16/2021   History of present illness Patient is a 60 y/o female who presents with left tib/fib fx s/p fall at home, now s/p IM nail left tibia 03/12/21. Also with AKI, hyperkalemia and cirrhosis with ascites. PMH includes HTN, DM, neuropathy.   OT comments  Pt's ability to transfer has greatly improved since previous session, able to complete lateral scoot transfer to drop-arm [facility] with supervision. Pain still limiting pt function, however educated pt on stretching LLE to prevent joint stiffness. Pt presenting with deficits in balance, strength, ROM, activity tolerance, and pain at this time. Will continue to follow acutely to address impairments listed above. D/c plan remains appropriate.   Recommendations for follow up therapy are one component of a multi-disciplinary discharge planning process, led by the attending physician.  Recommendations may be updated based on patient status, additional functional criteria and insurance authorization.    Follow Up Recommendations  Home health OT    Assistance Recommended at Discharge Frequent or constant Supervision/Assistance  Equipment Recommendations  Other (comment)    Recommendations for Other Services PT consult    Precautions / Restrictions Precautions Precautions: Fall Required Braces or Orthoses: Splint/Cast Splint/Cast: LLE Restrictions Weight Bearing Restrictions: Yes LLE Weight Bearing: Non weight bearing       Mobility Bed Mobility Overal bed mobility: Needs Assistance Bed Mobility: Supine to Sit;Sit to Supine     Supine to sit: Supervision;HOB elevated Sit to supine: Supervision;HOB elevated        Transfers Overall transfer level: Needs assistance Equipment used: Rolling walker (2 wheels) Transfers: Bed to chair/wheelchair/BSC            Lateral/Scoot Transfers: Supervision General  transfer comment: minimal cuing for hand placement     Balance Overall balance assessment: Needs assistance;History of Falls Sitting-balance support: Feet supported;No upper extremity supported Sitting balance-Leahy Scale: Good                                     ADL either performed or assessed with clinical judgement   ADL Overall ADL's : Needs assistance/impaired                         Toilet Transfer: Supervision/safety;Requires drop arm;BSC/3in1 Toilet Transfer Details (indicate cue type and reason): performed bed <> drop arm BSC transfer with min v/c                Extremity/Trunk Assessment Upper Extremity Assessment Upper Extremity Assessment: Overall WFL for tasks assessed   Lower Extremity Assessment Lower Extremity Assessment: Defer to PT evaluation        Vision   Vision Assessment?: No apparent visual deficits   Perception Perception Perception: Within Functional Limits   Praxis Praxis Praxis: Intact    Cognition Arousal/Alertness: Awake/alert Behavior During Therapy: Restless;Anxious;WFL for tasks assessed/performed Overall Cognitive Status: Within Functional Limits for tasks assessed                                            Exercises     Shoulder Instructions       General Comments educated pt on stretching LLE while in cast, using sheet/gait belt around bottom of foot to stretch calf muscle and  prevent joint stiffness.    Pertinent Vitals/ Pain       Pain Assessment: Faces Pain Score: 8  Faces Pain Scale: Hurts even more Pain Location: LLE Pain Descriptors / Indicators: Constant;Discomfort;Operative site guarding Pain Intervention(s): Limited activity within patient's tolerance;Monitored during session;Repositioned  Home Living                                          Prior Functioning/Environment              Frequency  Min 3X/week        Progress Toward  Goals  OT Goals(current goals can now be found in the care plan section)  Progress towards OT goals: Progressing toward goals  Acute Rehab OT Goals Patient Stated Goal: "return home" OT Goal Formulation: With patient Time For Goal Achievement: 03/27/21 Potential to Achieve Goals: Fair ADL Goals Pt Will Perform Lower Body Dressing: with min assist;sitting/lateral leans Pt Will Transfer to Toilet: with min assist;bedside commode Pt Will Perform Tub/Shower Transfer: with min assist;ambulating;shower seat  Plan Discharge plan remains appropriate;Frequency remains appropriate    Co-evaluation                 AM-PAC OT "6 Clicks" Daily Activity     Outcome Measure   Help from another person eating meals?: None Help from another person taking care of personal grooming?: A Little Help from another person toileting, which includes using toliet, bedpan, or urinal?: A Lot Help from another person bathing (including washing, rinsing, drying)?: A Lot Help from another person to put on and taking off regular upper body clothing?: A Little Help from another person to put on and taking off regular lower body clothing?: A Little 6 Click Score: 17    End of Session    Pain - Right/Left: Left Pain - part of body: Leg   Activity Tolerance Patient limited by pain   Patient Left in bed;with call bell/phone within reach;with bed alarm set   Nurse Communication Mobility status        Time: 2863-8177 OT Time Calculation (min): 19 min  Charges: OT General Charges $OT Visit: 1 Visit OT Treatments $Self Care/Home Management : 8-22 mins  Alfonzo Beers, OTD, OTR/L Acute Rehab [phone]   [name] 03/16/2021, 1:24 PM

## 2021-03-16 NOTE — Progress Notes (Signed)
Patient's HR spiked a yellow MEWS during her routine morning VS. Patient does not c/o of any lightheadedness or dizziness at this time, and only c/o of moderate pain radiating to her left hip whenever she flexes her foot. Dr. Pola Corn was aware of pt's current yellow MEWS prior to progression and has placed new orders. Pt was placed on the MEWS protocol per department's policy and CN has been notified.

## 2021-03-16 NOTE — TOC Initial Note (Signed)
Transition of Care [facility]) - Initial/Assessment Note    Patient Details  Name: Mallory Stewart MRN: [id number] Date of Birth: [birth year]  [facility]) CM/SW Contact:    Epifanio Lesches, RN Phone Number: 03/16/2021, 4:02 PM  Clinical Narrative:                 Admitted with L tib/fib fx after falling. From home with husband. States prior to admit independent with ADL's , no DME usage.     -  s/p IM nail left tibia 03/12/21  NCM spoke with pt regarding PT evaluation/ recommendations: HHPT. PT agreeable to home health services. Choice given. Pt with preference. Referral made with [facility] and accepted.DME needs noted 3-N-1  and Sliding board. Referral made with [employer]. Equipment will be delivered to bedside prior to d/c.  Pt states husband to provide the supervision and assistance needed once d/c. Pt has transportation to home . States no problem affording Rx med copay.  TOC team will continue to monitor and assist with needs...Marland KitchenMarland KitchenMarland Kitchen   Expected Discharge Plan: Home w Home Health Services Barriers to Discharge: Continued Medical Work up   Patient Goals and CMS Choice     Choice offered to / list presented to : Patient  Expected Discharge Plan and Services Expected Discharge Plan: Home w Home Health Services   Discharge Planning Services: CM Consult   Living arrangements for the past 2 months: Single Family Home                 DME Arranged: 3-N-1 (Sliding board) DME Agency: [employer] Date DME Agency Contacted: 03/16/21 Time DME Agency Contacted: [phone] Representative spoke with at DME Agency: Velna Hatchet HH Arranged: OT, PT HH Agency: [facility] Date [facility] Agency Contacted: 03/16/21 Time HH Agency Contacted: 1040 Representative spoke with at [facility] Agency: Kandee Keen  Prior Living Arrangements/Services Living arrangements for the past 2 months: Single Family Home Lives with:: Spouse Patient language and need for interpreter reviewed:: Yes Do  you feel safe going back to the place where you live?: Yes      Need for Family Participation in Patient Care: Yes (Comment) Care giver support system in place?: Yes (comment)   Criminal Activity/Legal Involvement Pertinent to Current Situation/Hospitalization: No - Comment as needed  Activities of Daily Living      Permission Sought/Granted   Permission granted to share information with : Yes, Verbal Permission Granted  Share Information with NAME: Jule Ser (Spouse)   [phone]           Emotional Assessment Appearance:: Appears stated age Attitude/Demeanor/Rapport: Engaged Affect (typically observed): Accepting Orientation: : Oriented to Self, Oriented to Place, Oriented to  Time, Oriented to Situation Alcohol / Substance Use: Not Applicable Psych Involvement: No (comment)  Admission diagnosis:  Preop respiratory exam [Z01.811] Pain [R52] Closed displaced spiral fracture of shaft of left tibia, initial encounter [S82.242A] Displaced spiral fracture of shaft of left tibia, initial encounter for closed fracture [S82.242A] Patient Active Problem List   Diagnosis Date Noted   Closed fracture of distal end of left tibia, initial encounter 03/12/2021    Class: Acute   Closed fracture of proximal fibula 03/12/2021    Class: Acute   Displaced spiral fracture of shaft of left tibia, initial encounter for closed fracture 03/12/2021   Impingement of right ankle joint    Gastric varices without bleeding 12/06/2020   Portal hypertension (HCC) 09/30/2020   Dilated bile duct 09/30/2020   Alcoholic cirrhosis  of liver with ascites (HCC) 09/29/2020   abdominal acites 09/17/2020   Right foot pain 09/17/2020   Laceration of lesser toe of right foot without foreign body present or damage to nail 09/17/2020   Acute right ankle pain 09/17/2020   Frequent falls 09/14/2020   Closed fracture of ankle 09/14/2020   Foot sprain, right, subsequent encounter 08/26/2020   Sprain of anterior  talofibular ligament of right ankle 08/26/2020   Splenomegaly 08/09/2020   Malnutrition of moderate degree (HCC) 08/08/2020   Transaminitis 08/06/2020   Serum total bilirubin elevated 08/06/2020   Fall at home, initial encounter 08/06/2020   Generalized weakness 08/05/2020   Idiopathic peripheral neuropathy 07/13/2020   Type 2 diabetes mellitus with diabetic polyneuropathy, with long-term current use of insulin (HCC) 07/13/2020   Persistent cough for 3 weeks or longer 07/04/2020   Low HDL (under 40) 06/21/2020   Post-nasal drip 06/21/2020   Allergic rhinitis 06/21/2020   Elevated alkaline phosphatase level 06/21/2020   Duodenitis - edema ? andgioedema 10/30/2018   Chronic alcoholic pancreatitis (HCC) 09/02/2018   Diabetes mellitus with circulatory complication, HTN (HCC) 08/22/2018   Dizziness 12/26/2017   Chronic pain 05/17/2017   Osteoarthritis of spine with radiculopathy, cervical region 09/17/2016   B12 deficiency 10/21/2014   Neuropathic pain of both legs 02/11/2014   Hepatic steatosis 04/27/2013   Hyperlipidemia associated with type 2 diabetes mellitus (HCC) 11/03/2008   Hypertension associated with diabetes (HCC) 09/29/2008   PAP SMEAR, ABNORMAL 09/29/2008   PCP:  Excell Seltzer, MD Pharmacy:   [id number] [place], [place] - [address] AT [facility] [address] [place] [address] Phone: [phone] Fax: [phone]     Social Determinants of Health (SDOH) Interventions    Readmission Risk Interventions No flowsheet data found.

## 2021-03-16 NOTE — Progress Notes (Signed)
At 1243, Dr. Otelia Sergeant and Dr. Pola Corn were included in an FYI chat, to inform them that pt's BP had dropped to 83/59 (67). Dr. Otelia Sergeant replied at 1249 with instructions to implement the hypotension protocol and bolus pt with 500 mL of NS over one hour, as well as to conduct a bladder scan of pt. All instructions were carried out per order and pt was returned to bed.   1335 - Dr.'s were informed of pt's volume in bladder at this time. Dr. Otelia Sergeant called to inform RN to stop the bolus and let her "dry out".

## 2021-03-16 NOTE — Plan of Care (Signed)
  Problem: Health Behavior/Discharge Planning: Goal: Ability to manage health-related needs will improve Outcome: Progressing   

## 2021-03-16 NOTE — Progress Notes (Signed)
Mobility Specialist Progress Note   03/16/21 1615  Mobility  Activity Ambulated in room  Level of Assistance Moderate assist, patient does 50-74%  Assistive Device Front wheel walker  LLE Weight Bearing NWB  Distance Ambulated (ft) 20 ft  Mobility Ambulated with assistance in room  Mobility Response Tolerated well  Mobility performed by Mobility specialist  $Mobility charge 1 Mobility   Received pt in bed having no complaints and motivated for mobility session. Pt doing better at understanding NWB precautions. Pt needs modA for ambulation d/t deficit in balance and strength. Tends to sway to far forwards or backwards when using RW, needs posterior support. Returned back to bed w/ call bell by side and all needs met.       Holland Falling Mobility Specialist Phone Number [phone]

## 2021-03-16 NOTE — Progress Notes (Signed)
Patient ID: Mallory Stewart, female   DOB: 04-Jul-1960, 60 y.o.   MRN: [id number] Nursing called to give results of orthostatic BPs and apparently this  Patient standing BP is low. She was restarted on metoprolol by hospitalist yet the call for change is blood pressure was referred to me. Normally when a change in antihypertension medications is initiated by a physician and it results in hypotension even orthostatic hypotension I would expect that nursing would contact the physician that had recently reinstituted the antihypertensive agent. In stead I was contacted and responded with recommending a fluid bolus and volume Replacement would be normally the case with a patient post surgery. This patient is however nearly 4 days post op and I do not  Think hypovolemia is the issue likely it relates to medications and resumption of metoprolol. In general patients that have liver disease and acites do better with less hydration and in essence the drier state Prevents reaccumulation of ascites and swelling in general. So in later discussion with the Nurse I was told about the change in medications and realized that the orthostatic hypotension is probably medication related. I will discuss with hospitalist but it the knowledge of the change in medications was known likely I would not have given a bolus. I do not think the bolus portion should be of concern. Will discuss the situation with Hospiitalist.

## 2021-03-16 NOTE — Progress Notes (Addendum)
PROGRESS NOTE  Mallory Stewart  DOB: 08/08/1960  PCP: Excell Seltzer, MD JSH:[id number]  DOA: 03/12/2021  LOS: 4 days  Hospital Day: 5  Chief Complaint  Patient presents with   Fall   Ankle Pain    Brief narrative: Mallory Stewart is a 60 y.o. female with PMH significant for DM2, alcoholic liver cirrhosis, portal hypertension, ascites, chronic pancreatitis, vitamin B12 deficiency who lives at home. Patient presented to the ED on 11/6 after a fall at home.  Imaging showed left displaced angulated distal tibia/fibula fracture. Admitted to orthopedic service and underwent left intramedullary nailing of tibia. Medicine consultation was called for comanagement of medical issues. Patient follows up with Dr. Leone Payor GI.  She reports that few weeks ago, she was noted to have increased ascites and hence the doses of Lasix and Aldactone were increased.  In last several days, patient has been getting intermittent lightheadedness. On 11/6, while walking, she felt lightheaded, she held onto her daughter nearby and slowly slumped to the floor.  Did not pass out.  Her husband heard her in it and it immediately. Labs on admission showed potassium elevated to 5.5 with creatinine elevated 1.78 from a baseline of 0.96, blood sugar was elevated to over 300.   Subjective: Patient was seen and examined this am.  Propped up in bed.  Not in distress except for when she moves her left leg which is not the operated side.  No orthostatic dizziness in last 24 hours.  Heart rate elevated this morning probably because metoprolol was on hold.  Assessment/Plan: Closed fracture of distal end of left tibia -Sp intramedullary nailing by orthopedics -Orthopedics following.  Patient continues to complain of pain on the operated leg. Per orthopedic note, swelling and hematoma about the left peroneal nerve at the proximal fibula is likely because of the weakness and pain.  -DVT prophylaxis and pain management per  orthopedics team  Near syncope Orthostatic hypotension History of portal hypertension -Per history, prior to admission, it seems patient got lightheaded and gradually slumped to the floor. Her diuretics doses were increased recently.  Her admission labs showed AKI.  I think patient was getting orthostatic hypotension leading to near syncope and fall. -Patient had orthostatic hypotension in the hospital as well on 11/8.  She was given IV hydration.  Blood pressure meds were held.  Blood pressure improved.  No longer having orthostatic hypotension. -She has portal hypertension and at home was on metoprolol 50 mg twice daily, Lasix 40 mg daily, Aldactone 100 mg daily, losartan 100 mg daily. -Because of orthostatic hypotension, her BP meds were held and are being gradually resumed. -I resumed metoprolol this morning.  I called and discussed this with patient's gastroenterologist Dr. Leone Payor.  His team will see the patient in the hospital.  Alcoholic liver cirrhosis cirrhosis with portal hypertension and ascites -Ultrasound abdomen showed considerable abdominal ascites.  On 11/7, 5.6 L of amber-colored fluid removed with paracentesis. -Continue PPI. -Ammonia level was up at 44 on 11/8.  Started lactulose.  Target 2-3 bowel movements a day.  AKI -Creatinine less than 1 at baseline, presented with creatinine elevated 1.78.  Gradually improved with IV fluid.Marland Kitchen Recent Labs    09/23/20 0914 11/04/20 1031 12/06/20 1205 03/12/21 0228 03/12/21 0442 03/12/21 2058 03/13/21 0130 03/14/21 0531 03/15/21 0236 03/16/21 0303  BUN 7 12 12  32* 32* 28* 28* 27* 23* 21*  CREATININE 0.93 1.15 0.96 1.78* 1.74* 1.62* 1.52* 1.63* 1.25* 1.18*   Hyperkalemia Hypomagnesemia -Potassium was  elevated on admission to 5.5 and peaked at 6.  Improved to normal with Lokelma.  -Magnesium level on 11/8 was low at 1.6.  Replacement given. Recent Labs  Lab 03/12/21 2058 03/13/21 0130 03/14/21 0531 03/15/21 0236  03/16/21 0303  K 5.5* 6.0* 4.3 4.9 4.9  MG  --   --  1.6*  --   --   PHOS  --   --  2.6  --   --    Acute anemia -Postoperatively, hemoglobin level dropped down.  Currently between 8 and 9.  No active bleeding elsewhere noted. -Continue to monitor.  Consideration of EGD and colonoscopy as an outpatient with GI. Recent Labs    06/16/20 0838 08/05/20 1209 08/24/20 1228 09/29/20 1045 03/13/21 0130 03/14/21 0531 03/14/21 1124 03/15/21 0236 03/16/21 0303  HGB  --    < >  --    < > 8.5* 8.0* 9.4* 8.9* 8.8*  MCV  --    < >  --    < > 89.4 89.7 89.6 91.6 91.1  VITAMINB12 314  --   --   --   --   --   --   --   --   FERRITIN  --   --  229.2  --   --   --   --   --   --    < > = values in this interval not displayed.   Type 2 diabetes mellitus with hyperglycemia -A1c 7.9 -Home meds include Lantus 30 units daily, Jardiance 25 mg daily, -Continue the same post discharge.  Anxiety/depression -Home meds include Elavil 50 mg at bedtime, Lyrica 75 mg daily -Continue the same.  Mobility: PT eval Living condition: Lives at home with husband Goals of care:   Code Status: Full Code  Nutritional status: Body mass index is 23.92 kg/m.     Diet:  Diet Order             Diet Carb Modified Fluid consistency: Thin; Room service appropriate? Yes  Diet effective now                  DVT prophylaxis:  Place TED hose Start: 03/12/21 1948 Foot Pump / plexipulse Start: 03/12/21 1948   Antimicrobials: None Fluid: IV fluid is stopped Family Communication: None at bedside  Status is: Inpatient  Dispo: The patient is from: Home              Anticipated d/c is to: PT eval obtained.  Home health PT recommended.              Patient currently is not medically stable to d/c.   Difficult to place patient No     Infusions:   lactated ringers 75 mL/hr at 03/15/21 1424    Scheduled Meds:  amitriptyline  50 mg Oral QHS   aspirin EC  81 mg Oral BID   Chlorhexidine Gluconate Cloth  6  each Topical Q0600   docusate sodium  100 mg Oral BID   ferrous sulfate  325 mg Oral BID WC   insulin aspart  0-15 Units Subcutaneous TID WC   insulin aspart  0-5 Units Subcutaneous QHS   insulin glargine-yfgn  25 Units Subcutaneous QHS   metoprolol tartrate  25 mg Oral BID   multivitamin with minerals  1 tablet Oral Daily   oxyCODONE  15 mg Oral Q12H   pantoprazole  40 mg Oral BID AC   pregabalin  75 mg Oral BID  PRN meds: acetaminophen, ALPRAZolam, bisacodyl, diphenhydrAMINE, HYDROmorphone (DILAUDID) injection, lactulose, lidocaine (PF), metoCLOPramide **OR** metoCLOPramide (REGLAN) injection, ondansetron **OR** ondansetron (ZOFRAN) IV, oxyCODONE, oxyCODONE, polyethylene glycol, sodium phosphate   Antimicrobials: Anti-infectives (From admission, onward)    Start     Dose/Rate Route Frequency Ordered Stop   03/12/21 2030  ceFAZolin (ANCEF) IVPB 1 g/50 mL premix        1 g 100 mL/hr over 30 Minutes Intravenous Every 6 hours 03/12/21 1947 03/13/21 0852   03/12/21 1300  ceFAZolin (ANCEF) IVPB 2g/100 mL premix        2 g 200 mL/hr over 30 Minutes Intravenous On call to O.R. 03/12/21 1206 03/12/21 1455   03/12/21 1216  ceFAZolin (ANCEF) 2-4 GM/100ML-% IVPB       Note to Pharmacy: Launa Flight   : cabinet override      03/12/21 1216 03/12/21 1439       Objective: Vitals:   03/16/21 0512 03/16/21 0805  BP: 109/72 111/70  Pulse: (!) 101 (!) 113  Resp: 17   Temp: 98.4 F (36.9 C)   SpO2: 99% 98%    Intake/Output Summary (Last 24 hours) at 03/16/2021 1000 Last data filed at 03/16/2021 0655 Gross per 24 hour  Intake 1778.46 ml  Output 3560 ml  Net -1781.54 ml   Filed Weights   03/12/21 1231  Weight: 73.5 kg   Weight change:  Body mass index is 23.92 kg/m.   Physical Exam: General exam: Pleasant middle-aged Caucasian female.  Not in distress. Skin: No rashes, lesions or ulcers. HEENT: Atraumatic, normocephalic, no obvious bleeding Lungs: Clear to auscultation  bilaterally CVS: Mild tachycardia, no murmur. GI/Abd: soft, no tenderness, mild distention from ascites, bowel sound present CNS: Alert, awake, oriented X 3. Continues to have mild tremors. Psychiatry: Mood appropriate Extremities: No pedal edema, no calf tenderness.  Left leg status post surgery.  Cast on place.  Data Review: I have personally reviewed the laboratory data and studies available.  F/u labs ordered Unresulted Labs (From admission, onward)    None       Signed, Lorin Glass, MD [facility] 03/16/2021

## 2021-03-16 NOTE — Telephone Encounter (Signed)
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  She has history of cirrhosis  She needs to have CBC, and be met done on Tuesday, under Dr. Celesta Aver name  We are putting her back on low-dose Lasix 20 and Aldactone 50 milligrams daily I will switch to Corgard.  Needs an office visit with Dr. Carlean Purl preferably also by her request apparently she has an appointment on December 14 but hopefully can be seen sooner Dr. Katha Cabal was trying to find a place to squeeze her in-thanks

## 2021-03-16 NOTE — Telephone Encounter (Signed)
-----   Message from Sammuel Cooper, PA-C sent at 03/16/2021  1:30 PM EST ----- Regarding: labs Beth, please change the BMET to [facility] and add AFP for this patient for next Tuesday thanks

## 2021-03-16 NOTE — Progress Notes (Signed)
PT Cancellation Note  Patient Details Name: Mallory Stewart MRN: [id number] DOB: Jan 17, 1961   Cancelled Treatment:    Reason Eval/Treat Not Completed: Patient declined, no reason specified - pt endorses dizziness after taking pain medication, requests to rest. PT to continue to follow, will check back tomorrow.  Marye Round, PT DPT Acute Rehabilitation Services Pager [phone]  Office [phone]    Truddie Coco 03/16/2021, 3:03 PM

## 2021-03-16 NOTE — Progress Notes (Signed)
     Subjective: 4 Days Post-Op Procedure(s) (LRB): INTRAMEDULLARY (IM) NAIL TIBIAL (Left) Awake, alert and complaints of pain. I am just sitting here crying due to pain.  Patient reports pain as moderate.    Objective:   VITALS:  Temp:  [97.8 F (36.6 C)-98.4 F (36.9 C)] 98.4 F (36.9 C) (11/10 0512) Pulse Rate:  [77-113] 113 (11/10 0805) Resp:  [17-18] 17 (11/10 0512) BP: (99-133)/(66-85) 111/70 (11/10 0805) SpO2:  [98 %-100 %] 98 % (11/10 0805)  Neurologically intact ABD soft Neurovascular intact Sensation intact distally Intact pulses distally Dorsiflexion/Plantar flexion intact Incision: dressing C/D/I and no drainage   LABS Recent Labs    03/14/21 0531 03/14/21 1124 03/15/21 0236 03/16/21 0303  HGB 8.0* 9.4* 8.9* 8.8*  WBC 5.1 6.2 4.7 4.0  PLT 123* 135* 126* 116*   Recent Labs    03/15/21 0236 03/16/21 0303  NA 129* 127*  K 4.9 4.9  CL 96* 97*  CO2 26 23  BUN 23* 21*  CREATININE 1.25* 1.18*  GLUCOSE 108* 98   No results for input(s): LABPT, INR in the last 72 hours.   Assessment/Plan: 4 Days Post-Op Procedure(s) (LRB): INTRAMEDULLARY (IM) NAIL TIBIAL (Left)  Advance diet Up with therapy D/C IV fluids Discharge home with home health Adjust baseline oxycodone ER to 15 mg each 12 hours. Medicine to see and may be ready form medical stand point for discharge.   Vira Browns 03/16/2021, 10:18 AM Patient ID: Mallory Stewart, female   DOB: Dec 23, 1960, 60 y.o.   MRN: [id number]

## 2021-03-16 NOTE — Consult Note (Addendum)
Attending physician's note   I have taken an interval history, reviewed the chart and examined the patient. I agree with the Advanced Practitioner's note, impression, and recommendations as outlined.   60 year old female with history of EtOH cirrhosis c/b portal hypertension, ascites, esophageal varices, portal hypertensive gastropathy, admitted with left tib-fib fracture requiring tibial nailing.  Admission evaluation also notable for large volume ascites, s/p LVP on 11/7 (5.6 L removed; no labs for review), mildly elevated ammonia (44; started on lactulose), and AKI (creatinine 1.78 on admission; downtrending to 1.18 today).  She reports having nothing to drink for many months now; "I think I had a glass of champagne last New Year's, and otherwise nothing".  Recently increased Lasix/Aldactone to 40 mg / 100 mg in 01/2021.  Unfortunately, patient never returned for BMP check after medication change, so not sure if AKI today is new or persistent.  1) EtOH cirrhosis 2) Ascites 3) History of Esophageal Varices 4) Portal hypertensive gastropathy - Applauded her on complete cessation of all EtOH - Plan to restart low-dose diuretics with Lasix 20 mg/day and Aldactone 50 mg/day - Strongly reminded patient that she needs to return to the lab early next week for BMP check - Low-sodium diet - Does take metoprolol 50 mg bid.  Will change to NSBB - MELD 22.  This may be somewhat artificially elevated due to improving AKI (creatinine 1.18; baseline 0.96).  Repeat labs at follow-up - Did briefly discussed the possibility of medical refractory or renal intolerant ascites, which could necessitate TIPS vs serial large-volume therapeutic paracentesis - Will arrange for follow-up in the GI clinic  5) Hypertension - Holding losartan for now - Exchanging beta-blocker as above - Recommend close follow-up with PCM for BP management  Okay for discharge from a GI standpoint.  [address] DO,  FACG [phone] office         Consultation  Referring Provider:  TRH/ Mallory Stewart Primary Care Physician:  Mallory Sanders, MD Primary Gastroenterologist:  Dr.Gessner  Reason for Consultation:  Cirrhosis  HPI: Mallory Stewart is a 60 y.o. female, who was admitted 4 days ago after a fall at home at which time she sustained a left tib/fib fracture and has required operative repair.  Plan is for her to discharge home today Is known to Dr. Carlean Stewart with history of EtOH induced cirrhosis, decompensated with known portal hypertension, ascites and history of chronic pancreatitis.  Also with adult onset diabetes mellitus. She had been on diuretics at home/Lasix 40 mg daily and Aldactone 100 mg p.o. daily over the past couple of months after she had called in late September with increase in ascites.  She did undergo upper abdominal ultrasound at that time which showed a large amount of ascites, status postcholecystectomy and a chronically dilated CBD. Says that she has been following a 2 g sodium diet, and no EtOH at all. Despite the increase in diuretics she had significant ascites at the time of admission and has undergone paracentesis with removal of 0.6 L on 03/13/2021.  She says she feels much better at this point does not have any significant abdominal discomfort or distention.  She was noted to have orthostatic hypotension at the time of admission, and acute kidney injury with creatinine of 1.78.  Both diuretics have been held since admission and she was also taken off of Lyrica and sertraline/Zyrtec.  Labs as of today WBC 4.0/hemoglobin 8.8/hematocrit 26.7/platelets 116 Sodium 127/potassium 4.9/creatinine 1.18. INR 1.5/pro time 17.8  Ultrasound 03/12/2021-limited  only commented on large amount of ascites  Patient had EGD August 2022 with finding of an isolated grade 1 varix and moderate portal gastropathy, She had been on metoprolol 50 mg twice daily prior to admission as well.   Past  Medical History:  Diagnosis Date  . Alcohol-induced chronic pancreatitis (Mallory Stewart)   . B12 deficiency   . Concussion   . DDD (degenerative disc disease), cervical   . Diabetes (Mallory Stewart)   . DKA, type 2 (Mallory Stewart)   . Gastric outlet obstruction 08/12/2018  . Hypertension   . Neuropathy   . Pancreatic pseudocyst/cyst 05/06/2013  . Seasonal allergies     Past Surgical History:  Procedure Laterality Date  . ANKLE ARTHROSCOPY Right 12/20/2020   Procedure: RIGHT ANKLE ARTHROSCOPIC DEBRIDEMENT;  Surgeon: Mallory Minion, MD;  Location: [place];  Service: Orthopedics;  Laterality: Right;  . BIOPSY  01/19/2019   Procedure: BIOPSY;  Surgeon: Rush Landmark Telford Nab., MD;  Location: [place];  Service: Gastroenterology;;  . CHOLECYSTECTOMY N/A 07/28/2016   Procedure: LAPAROSCOPIC CHOLECYSTECTOMY WITH INTRAOPERATIVE CHOLANGIOGRAM;  Surgeon: Mallory Skinner, MD;  Location: [facility];  Service: General;  Laterality: N/A;  . ESOPHAGOGASTRODUODENOSCOPY (EGD) WITH PROPOFOL N/A 08/13/2018   Procedure: ESOPHAGOGASTRODUODENOSCOPY (EGD) WITH PROPOFOL;  Surgeon: Mallory Stewart., MD;  Location: [place];  Service: Gastroenterology;  Laterality: N/A;  . ESOPHAGOGASTRODUODENOSCOPY (EGD) WITH PROPOFOL N/A 01/19/2019   Procedure: ESOPHAGOGASTRODUODENOSCOPY (EGD) WITH PROPOFOL;  Surgeon: Rush Landmark Telford Nab., MD;  Location: [facility];  Service: Gastroenterology;  Laterality: N/A;  . IR PARACENTESIS  08/08/2020  . IR PARACENTESIS  03/13/2021  . LEEP  1990's  . ORIF FOOT FRACTURE  09/2008   L 5th metatarsal  . REFRACTIVE SURGERY  2000  . TIBIA IM NAIL INSERTION Left 03/12/2021   Procedure: INTRAMEDULLARY (IM) NAIL TIBIAL;  Surgeon: Jessy Oto, MD;  Location: [facility];  Service: Orthopedics;  Laterality: Left;  . TONSILLECTOMY    . UPPER ESOPHAGEAL ENDOSCOPIC ULTRASOUND (EUS) N/A 08/13/2018   Procedure: UPPER ESOPHAGEAL ENDOSCOPIC ULTRASOUND (EUS);  Surgeon: Mallory Stewart., MD;  Location:  [place];  Service: Gastroenterology;  Laterality: N/A;  . UPPER ESOPHAGEAL ENDOSCOPIC ULTRASOUND (EUS) N/A 01/19/2019   Procedure: UPPER ESOPHAGEAL ENDOSCOPIC ULTRASOUND (EUS);  Surgeon: Mallory Stewart., MD;  Location: [place];  Service: Gastroenterology;  Laterality: N/A;    Prior to Admission medications   Medication Sig Start Date End Date Taking? Authorizing Provider  amitriptyline (ELAVIL) 50 MG tablet TAKE 1 TABLET(50 MG) BY MOUTH AT BEDTIME Patient taking differently: Take 50 mg by mouth at bedtime. 03/03/21  Yes Bedsole, Amy E, MD  cetirizine (ZYRTEC) 10 MG tablet Take 10 mg by mouth at bedtime.   Yes [provider]  empagliflozin (JARDIANCE) 25 MG TABS tablet Take 1 tablet (25 mg total) by mouth daily before breakfast. Patient taking differently: Take 25 mg by mouth daily. 01/06/21  Yes Shamleffer, Melanie Crazier, MD  furosemide (LASIX) 40 MG tablet Take 1 tablet (40 mg total) by mouth daily. 01/27/21  Yes Gatha Mayer, MD  insulin glargine, 1 Unit Dial, (TOUJEO SOLOSTAR) 300 UNIT/ML Solostar Pen Inject 30 Units into the skin daily at 2 PM.   Yes [provider]  losartan (COZAAR) 100 MG tablet Take 1 tablet (100 mg total) by mouth daily. 06/21/20  Yes Bedsole, Amy E, MD  meloxicam (MOBIC) 7.5 MG tablet TAKE 1 TABLET(7.5 MG) BY MOUTH DAILY Patient taking differently: Take 7.5 mg by mouth daily. 02/01/21  Yes Persons,  Bevely Palmer, PA  metoprolol tartrate (LOPRESSOR) 50 MG tablet TAKE 1 TABLET(50 MG) BY MOUTH TWICE DAILY 02/01/21  Yes Bedsole, Amy E, MD  Multiple Vitamin (MULTIVITAMIN WITH MINERALS) TABS tablet Take 1 tablet by mouth daily. 07/25/18  Yes Mikhail, Clinical biochemist, DO  ONETOUCH ULTRA test strip CHECK BLOOD SUGAR TWO TIMES DAILY AS DIRECTED 03/23/20  Yes Bedsole, Amy E, MD  pantoprazole (PROTONIX) 40 MG tablet TAKE 1 TABLET BY MOUTH TWICE DAILY BEFORE A MEAL Patient taking differently: Take 40 mg by mouth daily. 09/19/20  Yes Gatha Mayer, MD   pregabalin (LYRICA) 75 MG capsule Take 1 capsule (75 mg total) by mouth 2 (two) times daily. 12/27/20  Yes Dondra Prader R, NP  spironolactone (ALDACTONE) 100 MG tablet Take 1 tablet (100 mg total) by mouth daily. 01/27/21  Yes Gatha Mayer, MD  ALPRAZolam (XANAX) 0.25 MG tablet TAKE 1 TABLET(0.25 MG) BY MOUTH DAILY AS NEEDED FOR ANXIETY Patient not taking: Reported on 03/12/2021 11/08/20   Mallory Sanders, MD  B-D UF III MINI PEN NEEDLES 31G X 5 MM MISC USE 4 TIMES A DAY AS DIRECTED 11/30/19   Shamleffer, Melanie Crazier, MD  Blood Glucose Monitoring Suppl (ONE TOUCH ULTRA 2) w/Device KIT Use to check blood sugars two times day 08/01/18   Bedsole, Amy E, MD  hepatitis A virus, PF, vaccine (HAVRIX, PF,) 1440 EL U/ML injection Please Give 1 ml IM on day 0 and repeat in 6 months 10/07/20   Gatha Mayer, MD  Lancets [facility] ULTRASOFT) lancets Use to check blood sugar two times a day. 08/01/18   Bedsole, Amy E, MD  oxyCODONE-acetaminophen (PERCOCET/ROXICET) 5-325 MG tablet Take 1 tablet by mouth every 12 (twelve) hours as needed for severe pain. Patient not taking: Reported on 03/12/2021 02/10/21   Suzan Slick, NP    Current Facility-Administered Medications  Medication Dose Route Frequency Provider Last Rate Last Admin  . acetaminophen (TYLENOL) tablet 325-650 mg  325-650 mg Oral Q6H PRN Jessy Oto, MD   325 mg at 03/16/21 5456  . ALPRAZolam Duanne Moron) tablet 0.25 mg  0.25 mg Oral TID PRN Jessy Oto, MD      . amitriptyline (ELAVIL) tablet 50 mg  50 mg Oral QHS Jessy Oto, MD   50 mg at 03/15/21 2108  . aspirin EC tablet 81 mg  81 mg Oral BID Terrilee Croak, MD   81 mg at 03/16/21 0808  . bisacodyl (DULCOLAX) EC tablet 5 mg  5 mg Oral Daily PRN Jessy Oto, MD      . Chlorhexidine Gluconate Cloth 2 % PADS 6 each  6 each Topical Q0600 Kathryne Eriksson, NP   6 each at 03/16/21 0615  . diphenhydrAMINE (BENADRYL) 12.5 MG/5ML elixir 12.5-25 mg  12.5-25 mg Oral Q4H PRN Jessy Oto, MD      .  docusate sodium (COLACE) capsule 100 mg  100 mg Oral BID Jessy Oto, MD   100 mg at 03/16/21 2563  . ferrous sulfate tablet 325 mg  325 mg Oral BID WC Jessy Oto, MD   325 mg at 03/16/21 8937  . HYDROmorphone (DILAUDID) injection 0.5-1 mg  0.5-1 mg Intravenous Q4H PRN Jessy Oto, MD      . insulin aspart (novoLOG) injection 0-15 Units  0-15 Units Subcutaneous TID WC Jessy Oto, MD   2 Units at 03/14/21 1246  . insulin aspart (novoLOG) injection 0-5 Units  0-5 Units Subcutaneous QHS Basil Dess  E, MD      . insulin glargine-yfgn (SEMGLEE) injection 25 Units  25 Units Subcutaneous QHS Jessy Oto, MD   25 Units at 03/15/21 2109  . lactated ringers infusion   Intravenous Continuous Chotiner, Yevonne Aline, MD 75 mL/hr at 03/15/21 1424 Infusion Verify at 03/15/21 1424  . lactulose (CHRONULAC) 10 GM/15ML solution 20 g  20 g Oral Daily PRN Mallory Stewart, Bin[place], MD      . lidocaine (PF) (XYLOCAINE) 1 % injection    PRN Rushie Nyhan C, NP   5 mL at 03/13/21 1109  . metoCLOPramide (REGLAN) tablet 5 mg  5 mg Oral Q8H PRN Jessy Oto, MD       Or  . metoCLOPramide (REGLAN) injection 5 mg  5 mg Intravenous Q8H PRN Jessy Oto, MD      . metoprolol tartrate (LOPRESSOR) tablet 25 mg  25 mg Oral BID Terrilee Croak, MD   25 mg at 03/16/21 1001  . multivitamin with minerals tablet 1 tablet  1 tablet Oral Daily Jessy Oto, MD   1 tablet at 03/16/21 [phone]  . ondansetron (ZOFRAN) tablet 4 mg  4 mg Oral Q6H PRN Jessy Oto, MD       Or  . ondansetron [facility]) injection 4 mg  4 mg Intravenous Q6H PRN Jessy Oto, MD      . oxyCODONE (Oxy IR/ROXICODONE) immediate release tablet 10-15 mg  10-15 mg Oral Q4H PRN Jessy Oto, MD   15 mg at 03/16/21 0158  . oxyCODONE (Oxy IR/ROXICODONE) immediate release tablet 5-10 mg  5-10 mg Oral Q4H PRN Jessy Oto, MD   5 mg at 03/15/21 1714  . oxyCODONE (OXYCONTIN) 12 hr tablet 15 mg  15 mg Oral Q12H Jessy Oto, MD   15 mg at 03/16/21 8676  .  pantoprazole (PROTONIX) EC tablet 40 mg  40 mg Oral BID AC Jessy Oto, MD   40 mg at 03/16/21 1950  . polyethylene glycol (MIRALAX / GLYCOLAX) packet 17 g  17 g Oral Daily PRN Jessy Oto, MD      . pregabalin (LYRICA) capsule 75 mg  75 mg Oral BID Jessy Oto, MD   75 mg at 03/16/21 9326  . sodium phosphate (FLEET) 7-19 GM/118ML enema 1 enema  1 enema Rectal Once PRN Jessy Oto, MD        Allergies as of 03/12/2021 - Review Complete 03/12/2021  Allergen Reaction Noted  . Gluten meal Other (See Comments) 07/22/2018  . Zocor [simvastatin] Other (See Comments) 04/06/2011    Family History  Problem Relation Age of Onset  . Other Mother        tachycardia.Marland KitchenMarland Kitchen?afib  . Stroke Father        after hernia suegery  . Prostate cancer Father   . Other Father        global transient amnesia, unclear source  . Atrial fibrillation Sister   . Healthy Brother   . Healthy Brother   . Coronary artery disease Paternal Grandmother   . Heart attack Paternal Grandmother 53  . Brain cancer Maternal Grandfather        ?  Marland Kitchen Cancer Paternal Grandfather        ?  Marland Kitchen Breast cancer Maternal Grandmother     Social History   Socioeconomic History  . Marital status: Married    Spouse name: Not on file  . Number of children: 1  . Years of education: Not  on file  . Highest education level: Not on file  Occupational History  . Occupation: Mudlogger of business development  Tobacco Use  . Smoking status: Never  . Smokeless tobacco: Never  Vaping Use  . Vaping Use: Never used  Substance and Sexual Activity  . Alcohol use: Not Currently    Alcohol/week: 7.0 standard drinks    Types: 7 Glasses of wine per week    Comment: wine  . Drug use: No  . Sexual activity: Not on file  Other Topics Concern  . Not on file  Social History Narrative   Patient is married one stepson    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 not  traveling   regular exercise , walking 4-5 times a week   Diet fruits and veggies, water   Alcohol at least several glasses of wine a week Stopped 08/2018   Never smoker, no drug use   Social Determinants of Radio broadcast assistant Strain: Not on file  Food Insecurity: Not on file  Transportation Needs: Not on file  Physical Activity: Not on file  Stress: Not on file  Social Connections: Not on file  Intimate Partner Violence: Not on file    Review of Systems: Pertinent positive and negative review of systems were noted in the above HPI section.  All other review of systems was otherwise negative.   Physical Exam: Vital signs in last 24 hours: Temp:  [97.8 F (36.6 C)-98.4 F (36.9 C)] 98.4 F (36.9 C) (11/10 1015) Pulse Rate:  [77-113] 108 (11/10 1036) Resp:  [17-18] 18 (11/10 1036) BP: (97-112)/(66-79) 97/69 (11/10 1036) SpO2:  [98 %-100 %] 98 % (11/10 1036) Last BM Date: 03/15/21 General:   Alert,  Well-developed, thin older white female pleasant and cooperative in NAD Head:  Normocephalic and atraumatic. Eyes:  Sclera clear, no icterus.   Conjunctiva pink. Ears:  Normal auditory acuity. Nose:  No deformity, discharge,  or lesions. Mouth:  No deformity or lesions.   Neck:  Supple; no masses or thyromegaly. Lungs:  Clear throughout to auscultation.   No wheezes, crackles, or rhonchi.  Heart:  Regular rate and rhythm; no murmurs, clicks, rubs,  or gallops. Abdomen:  Soft,nontender, BS active,nonpalp mass, no appreciable splenomegaly no appreciable fluid wave Rectal: Not done Msk:  Symmetrical without gross deformities. . Pulses:  Normal pulses noted. Extremities:  Without clubbing or edema. Neurologic:  Alert and  oriented x4;  grossly normal neurologically. Skin:  Intact without significant lesions or rashes.. Psych:  Alert and cooperative. Normal mood and affect.  Intake/Output from previous day: 11/09 0701 - 11/10 0700 In: 1778.5 [I.V.:1778.5] Out: 3560  [Urine:3560] Intake/Output this shift: No intake/output data recorded.  Lab Results: Recent Labs    03/14/21 1124 03/15/21 0236 03/16/21 0303  WBC 6.2 4.7 4.0  HGB 9.4* 8.9* 8.8*  HCT 27.6* 27.1* 26.7*  PLT 135* 126* 116*   BMET Recent Labs    03/14/21 0531 03/15/21 0236 03/16/21 0303  NA 128* 129* 127*  K 4.3 4.9 4.9  CL 96* 96* 97*  CO2 $Re'24 26 23  'qhD$ GLUCOSE 120* 108* 98  BUN 27* 23* 21*  CREATININE 1.63* 1.25* 1.18*  CALCIUM 8.6* 8.7* 8.6*   LFT Recent Labs    03/15/21 0236  PROT 5.5*  ALBUMIN 2.4*  AST 71*  ALT 20  ALKPHOS 249*  BILITOT 1.2  BILIDIR 0.4*  IBILI 0.8   PT/INR No  results for input(s): LABPROT, INR in the last 72 hours. Hepatitis Panel No results for input(s): HEPBSAG, HCVAB, HEPAIGM, HEPBIGM in the last 72 hours.    IMPRESSION:  #66 60 year old white female with decompensated EtOH induced cirrhosis with portal hypertension, isolated gastric varix, and ascites who was admitted 4 days ago after she fell and sustained a left tib-fib fracture which required surgery  Patient had developed increased ascites over the past 3 to 4 months, her diuretics had been doubled at the end of September, despite that on admission she had significant ascites and underwent paracentesis with removal of over 5 L.  Unable to calculate M ELD, as no CMET done since admission-Will reevaluate as outpatient  She had orthostatic hypotension on admission likely combination of multiple medications including Cozaar, metoprolol and diuretics as well as Lyrica. Multiple meds held since admission, including diuretics  #2 acute kidney injury resolving  #3 thrombocytopenia #4.  Coagulopathy  PLAN: #1 2 g sodium carb modified diet # 2  please  resume Lasix 20 mg p.o. every morning and Aldactone 50 mg p.o. every morning on discharge.  We will plan outpatient labs on Tuesday, 11/17/2020 through our office #3 we will arrange for follow-up with Dr. Carlean Stewart in the next 2 weeks.   She currently has an appointment scheduled for December 14, office will arrange earlier follow-up , and patient will be called with appointment  Will initiate Corgard 20 mg p.o. daily for portal hypertension Complete EtOH abstinence  Blood pressure will need to be monitored carefully if metoprolol and/or Cozaar are to be reinitiated at the time of discharge.   Amy Esterwood PA-C 03/16/2021, 12:20 PM

## 2021-03-17 DIAGNOSIS — S82302A Unspecified fracture of lower end of left tibia, initial encounter for closed fracture: Secondary | ICD-10-CM | POA: Diagnosis not present

## 2021-03-17 LAB — GLUCOSE, CAPILLARY
Glucose-Capillary: 87 mg/dL (ref 70–99)
Glucose-Capillary: 111 mg/dL — ABNORMAL HIGH (ref 70–99)

## 2021-03-17 MED ORDER — OXYCODONE HCL 5 MG PO TABS: 5.0000 mg | ORAL_TABLET | ORAL | 0 refills | Status: DC | PRN

## 2021-03-17 MED ORDER — LACTULOSE 10 GM/15ML PO SOLN: 20.0000 g | Freq: Every day | ORAL | 0 refills | Status: DC | PRN

## 2021-03-17 MED ORDER — FERROUS SULFATE 325 (65 FE) MG PO TABS: 325.0000 mg | ORAL_TABLET | Freq: Two times a day (BID) | ORAL | 3 refills | Status: DC

## 2021-03-17 MED ORDER — DOCUSATE SODIUM 100 MG PO CAPS: 100.0000 mg | ORAL_CAPSULE | Freq: Two times a day (BID) | ORAL | 0 refills | Status: DC

## 2021-03-17 MED ORDER — OXYCODONE HCL ER 15 MG PO T12A
15.0000 mg | EXTENDED_RELEASE_TABLET | Freq: Two times a day (BID) | ORAL | 0 refills | Status: DC
Start: 2021-03-17 — End: 2021-03-29

## 2021-03-17 MED ORDER — METOPROLOL TARTRATE 25 MG PO TABS: 25.0000 mg | ORAL_TABLET | Freq: Two times a day (BID) | ORAL | 0 refills | Status: DC

## 2021-03-17 MED ORDER — ASPIRIN 81 MG PO TBEC: 81.0000 mg | DELAYED_RELEASE_TABLET | Freq: Two times a day (BID) | ORAL | 11 refills | Status: DC

## 2021-03-17 NOTE — Progress Notes (Signed)
Physical Therapy Treatment Patient Details Name: Mallory Stewart MRN: [id number] DOB: August 03, 1960 Today's Date: 03/17/2021   History of Present Illness Patient is a 59 y/o female who presents with left tib/fib fx s/p fall at home, now s/p IM nail left tibia 03/12/21. Also with AKI, hyperkalemia and cirrhosis with ascites. PMH includes HTN, DM, neuropathy.    PT Comments    Patient progressing slowly towards PT goals. Pt's goal for session is to work on overall strengthening of LEs and UEs for successful mobility at home. Tolerated lateral scoot transfer with supervision for safety but difficulty maintaining NWB throughout transition. Reports feeling comfortable with transfers and reports she will not be hopping at all at home. Worked on sit to stands for strengthening and reviewed there ex. Pt continues to have difficulty standing while maintaining WB status despite cues. Will follow.    Recommendations for follow up therapy are one component of a multi-disciplinary discharge planning process, led by the attending physician.  Recommendations may be updated based on patient status, additional functional criteria and insurance authorization.  Follow Up Recommendations  Home health PT     Assistance Recommended at Discharge Frequent or constant Supervision/Assistance (for mobility)  Equipment Recommendations  Other (comment) (drop arm BSC, slide board)    Recommendations for Other Services       Precautions / Restrictions Precautions Precautions: Fall Required Braces or Orthoses: Splint/Cast Splint/Cast: LLE Restrictions Weight Bearing Restrictions: Yes LLE Weight Bearing: Non weight bearing     Mobility  Bed Mobility Overal bed mobility: Needs Assistance Bed Mobility: Supine to Sit     Supine to sit: Supervision;HOB elevated     General bed mobility comments: Supervision for safety. Use of rails.    Transfers Overall transfer level: Needs assistance Equipment used:  Rolling walker (2 wheels);None Transfers: Sit to/from Stand;Bed to chair/wheelchair/BSC Sit to Stand: Min assist          Lateral/Scoot Transfers: Supervision General transfer comment: Performed lateral scoot towards right into drop arm recliner, difficulty maintaining NWB consistently. Worked on standing trials for strengthening from chair x10 with cues for technique.    Ambulation/Gait               General Gait Details: Deferred as pt not able to maintain NWB and report sshe will not be hopping at home.   Stairs             Wheelchair Mobility    Modified Rankin (Stroke Patients Only)       Balance Overall balance assessment: Needs assistance;History of Falls Sitting-balance support: Feet supported;No upper extremity supported Sitting balance-Leahy Scale: Good     Standing balance support: During functional activity Standing balance-Leahy Scale: Poor Standing balance comment: External support for upright with use of RW, unable to keep NWB status consistently.                            Cognition Arousal/Alertness: Awake/alert Behavior During Therapy: Anxious;WFL for tasks assessed/performed Overall Cognitive Status: Within Functional Limits for tasks assessed                                          Exercises Other Exercises Other Exercises: Sit to stand from EOB x10 with slow descent and correct positioning of RLE Other Exercises: Tricep dips 3x10    General Comments  Pertinent Vitals/Pain Pain Assessment: Faces Faces Pain Scale: Hurts even more Pain Location: LLE Pain Descriptors / Indicators: Constant;Discomfort;Operative site guarding;Sore Pain Intervention(s): Monitored during session;Limited activity within patient's tolerance;Repositioned;Premedicated before session    Home Living                          Prior Function            PT Goals (current goals can now be found in the care  plan section) Progress towards PT goals: Progressing toward goals (eslowly)    Frequency    Min 3X/week      PT Plan Current plan remains appropriate    Co-evaluation              AM-PAC PT "6 Clicks" Mobility   Outcome Measure  Help needed turning from your back to your side while in a flat bed without using bedrails?: A Little Help needed moving from lying on your back to sitting on the side of a flat bed without using bedrails?: A Little Help needed moving to and from a bed to a chair (including a wheelchair)?: A Little Help needed standing up from a chair using your arms (e.g., wheelchair or bedside chair)?: A Lot Help needed to walk in hospital room?: Total Help needed climbing 3-5 steps with a railing? : Total 6 Click Score: 13    End of Session Equipment Utilized During Treatment: Gait belt Activity Tolerance: Patient tolerated treatment well Patient left: in chair;with call bell/phone within reach Nurse Communication: Mobility status PT Visit Diagnosis: Unsteadiness on feet (R26.81);Difficulty in walking, not elsewhere classified (R26.2)     Time: 1610-9604 PT Time Calculation (min) (ACUTE ONLY): 17 min  Charges:  $Therapeutic Activity: 8-22 mins                     Mallory Stewart, PT, DPT Acute Rehabilitation Services Pager [phone] Office [phone]      Mallory Stewart 03/17/2021, 11:15 AM

## 2021-03-17 NOTE — Progress Notes (Addendum)
PROGRESS NOTE    BRIANCA SHIH  AJG:[id number] DOB: Jan 27, 1961 DOA: 03/12/2021 PCP: Excell Seltzer, MD   Brief Narrative:  This 60 y.o. female with PMH significant for DM2, alcoholic liver cirrhosis, portal hypertension, ascites, chronic pancreatitis, vitamin B12 deficiency who lives at home. presented to the ED on 11/6 after a fall at home.  Imaging showed left displaced angulated distal tibia/fibula fracture. Admitted to orthopedic service and underwent left intramedullary nailing of tibia. Medicine consultation was called for comanagement of medical issues. Patient follows up with Dr. Leone Payor GI.  She reports that few weeks ago, she was noted to have increased ascites and hence the doses of Lasix and Aldactone were increased.  In last several days, patient has been getting intermittent lightheadedness. On 11/6, while walking, she felt lightheaded, she held onto her daughter nearby and slowly slumped to the floor.  Did not pass out.  Her husband heard her in it and it immediately.  Patient is awaiting  skilled nursing facility.  Her blood pressure has been running low.  Assessment & Plan:   Principal Problem:   Closed fracture of distal end of left tibia, initial encounter Active Problems:   Ascites   Closed fracture of proximal fibula   Displaced spiral fracture of shaft of left tibia, initial encounter for closed fracture   Secondary esophageal varices without bleeding (HCC)   Closed fracture of distal end of left tibia: S/p intramedullary nail by orthopedics. Orthopedics following.  Patient continues to complain of pain on the operated leg.  Per orthopedic note, swelling and hematoma about the left peroneal nerve at the proximal fibula is likely because of the weakness and pain.  Continue DVT prophylaxis and pain management per orthopedics team   Near Syncope /  Orthostatic Hypotension. History of portal hypertension Patient reports getting intermittent lightheadedness PTA  and gradually slumped to the floor.  Her diuretics doses were increased recently.  Her admission labs showed AKI.  I think patient was getting orthostatic hypotension leading to near syncope and fall. Patient had orthostatic hypotension in the hospital as well on 11/8.  She was given IV hydration.  Blood pressure meds were held.  Blood pressure now improved.  No longer having orthostatic hypotension. She has portal hypertension and at home was on metoprolol 50 mg twice daily, Lasix 40 mg daily, Aldactone 100 mg daily, losartan 100 mg daily. Because of orthostatic hypotension, her BP meds were held and are being gradually resumed. We resumed metoprolol 11/10.  Dr. Leone Payor was notified .   Alcoholic liver cirrhosis with portal hypertension and ascites. Ultrasound abdomen showed considerable abdominal ascites.   On 11/7, 5.6 L of amber-colored fluid removed with paracentesis. Continue Protonix Ammonia level was up at 44 on 11/8.  Started lactulose.  Target 2-3 bowel movements a day.   Acute Kidney injury : Creatinine less than 1 at baseline, presented with creatinine elevated 1.78.   Renal functions gradually improving with IV hydration.  Hyperkalemia > resolved. Improved with Lokelma.  Hypomagnesemia: Replaced.  Continue to monitor  Hyponatremia: This could be due to cirrhosis. Continue to monitor.  Acute normochromic normocytic anemia: Postoperatively Hb dropped.  Currently hemoglobin between 8 and 9. There is no any active bleeding noted. Consideration of EGD and colonoscopy as an outpatient.  Type 2 diabetes: Hemoglobin A1c 7.9 Home medications: Lantus 30 units daily, Jardiance 25 mg daily. Continue regular insulin sliding scale.  Anxiety/depression: Continue Elavil and Lyrica      DVT prophylaxis:Ted hose Code Status:Full  code. Family Communication: No family at bed side. Disposition Plan:  Status is: Inpatient   Dispo: The patient is from: Home               Anticipated d/c is to: PT eval obtained.  Home health PT recommended.              Patient currently is not medically stable to d/c.              Difficult to place patient No   Procedures:ORIF Antimicrobials:   Anti-infectives (From admission, onward)    Start     Dose/Rate Route Frequency Ordered Stop   03/12/21 2030  ceFAZolin (ANCEF) IVPB 1 g/50 mL premix        1 g 100 mL/hr over 30 Minutes Intravenous Every 6 hours 03/12/21 1947 03/13/21 0852   03/12/21 1300  ceFAZolin (ANCEF) IVPB 2g/100 mL premix        2 g 200 mL/hr over 30 Minutes Intravenous On call to O.R. 03/12/21 1206 03/12/21 1455   03/12/21 1216  ceFAZolin (ANCEF) 2-4 GM/100ML-% IVPB       Note to Pharmacy: Launa Flight   : cabinet override      03/12/21 1216 03/12/21 1439       Subjective: Patient was seen and examined at bedside.  Overnight events noted.  Patient reports feeling better,  blood pressure has improved.  She was sitting comfortably on the chair, states she wants to be discharged home.  Pain is better controlled  Objective: Vitals:   03/16/21 1036 03/16/21 1221 03/16/21 2001 03/17/21 0335  BP: 97/69 (!) 83/59 102/65 113/76  Pulse: (!) 108 93 91 95  Resp: 18 18 18 19   Temp:  97.9 F (36.6 C) 98.2 F (36.8 C) 98.5 F (36.9 C)  TempSrc:  Oral    SpO2: 98% 95% 100% 95%  Weight:      Height:        Intake/Output Summary (Last 24 hours) at 03/17/2021 1211 Last data filed at 03/16/2021 1500 Gross per 24 hour  Intake 390.84 ml  Output --  Net 390.84 ml   Filed Weights   03/12/21 1231  Weight: 73.5 kg    Examination:  General exam: Appears comfortable, not in any acute distress.  Deconditioned. Respiratory system: Clear to auscultation. Respiratory effort normal.  RR 15 Cardiovascular system: S1-S2 heard, regular rate and rhythm, no murmur. Gastrointestinal system: Abdomen is soft, nontender, nondistended, BS +. Central nervous system: Alert and oriented. No focal neurological  deficits. Extremities: Left leg in cast.  Reports having mild tenderness. Skin: No rashes, lesions or ulcers Psychiatry: Judgement and insight appear normal. Mood & affect appropriate.     Data Reviewed: I have personally reviewed following labs and imaging studies  CBC: Recent Labs  Lab 03/13/21 0130 03/14/21 0531 03/14/21 1124 03/15/21 0236 03/16/21 0303  WBC 4.5 5.1 6.2 4.7 4.0  NEUTROABS  --  3.0 4.4 2.6 2.3  HGB 8.5* 8.0* 9.4* 8.9* 8.8*  HCT 25.4* 23.6* 27.6* 27.1* 26.7*  MCV 89.4 89.7 89.6 91.6 91.1  PLT 118* 123* 135* 126* 116*   Basic Metabolic Panel: Recent Labs  Lab 03/12/21 2058 03/13/21 0130 03/14/21 0531 03/15/21 0236 03/16/21 0303  NA 130* 130* 128* 129* 127*  K 5.5* 6.0* 4.3 4.9 4.9  CL 97* 98 96* 96* 97*  CO2 25 23 24 26 23   GLUCOSE 209* 246* 120* 108* 98  BUN 28* 28* 27* 23* 21*  CREATININE 1.62* 1.52*  1.63* 1.25* 1.18*  CALCIUM 8.6* 8.5* 8.6* 8.7* 8.6*  MG  --   --  1.6*  --   --   PHOS  --   --  2.6  --   --    GFR: Estimated Creatinine Clearance: 54.3 mL/min (A) (by C-G formula based on SCr of 1.18 mg/dL (H)). Liver Function Tests: Recent Labs  Lab 03/15/21 0236  AST 71*  ALT 20  ALKPHOS 249*  BILITOT 1.2  PROT 5.5*  ALBUMIN 2.4*   No results for input(s): LIPASE, AMYLASE in the last 168 hours. Recent Labs  Lab 03/14/21 0531 03/15/21 0236  AMMONIA 44* 25   Coagulation Profile: Recent Labs  Lab 03/16/21 1112  INR 1.5*   Cardiac Enzymes: No results for input(s): CKTOTAL, CKMB, CKMBINDEX, TROPONINI in the last 168 hours. BNP (last 3 results) No results for input(s): PROBNP in the last 8760 hours. HbA1C: No results for input(s): HGBA1C in the last 72 hours. CBG: Recent Labs  Lab 03/16/21 0759 03/16/21 1215 03/16/21 1615 03/16/21 2006 03/17/21 0827  GLUCAP 110* 117* 92 92 87   Lipid Profile: No results for input(s): CHOL, HDL, LDLCALC, TRIG, CHOLHDL, LDLDIRECT in the last 72 hours. Thyroid Function Tests: No  results for input(s): TSH, T4TOTAL, FREET4, T3FREE, THYROIDAB in the last 72 hours. Anemia Panel: No results for input(s): VITAMINB12, FOLATE, FERRITIN, TIBC, IRON, RETICCTPCT in the last 72 hours. Sepsis Labs: No results for input(s): PROCALCITON, LATICACIDVEN in the last 168 hours.  Recent Results (from the past 240 hour(s))  Resp Panel by RT-PCR (Flu A&B, Covid)     Status: None   Collection Time: 03/12/21  1:09 AM  Result Value Ref Range Status   SARS Coronavirus 2 by RT PCR NEGATIVE NEGATIVE Final    Comment: (NOTE) SARS-CoV-2 target nucleic acids are NOT DETECTED.  The SARS-CoV-2 RNA is generally detectable in upper respiratory specimens during the acute phase of infection. The lowest concentration of SARS-CoV-2 viral copies this assay can detect is 138 copies/mL. A negative result does not preclude SARS-Cov-2 infection and should not be used as the sole basis for treatment or other patient management decisions. A negative result may occur with  improper specimen collection/handling, submission of specimen other than nasopharyngeal swab, presence of viral mutation(s) within the areas targeted by this assay, and inadequate number of viral copies(<138 copies/mL). A negative result must be combined with clinical observations, patient history, and epidemiological information. The expected result is Negative.  Fact Sheet for Patients:  BloggerCourse.com  Fact Sheet for Healthcare Providers:  SeriousBroker.it  This test is no t yet approved or cleared by the [place] FDA and  has been authorized for detection and/or diagnosis of SARS-CoV-2 by FDA under an Emergency Use Authorization (EUA). This EUA will remain  in effect (meaning this test can be used) for the duration of the COVID-19 declaration under Section 564(b)(1) of the Act, 21 U.S.C.section 360bbb-3(b)(1), unless the authorization is terminated  or revoked sooner.        Influenza A by PCR NEGATIVE NEGATIVE Final   Influenza B by PCR NEGATIVE NEGATIVE Final    Comment: (NOTE) The Xpert Xpress SARS-CoV-2/FLU/RSV plus assay is intended as an aid in the diagnosis of influenza from Nasopharyngeal swab specimens and should not be used as a sole basis for treatment. Nasal washings and aspirates are unacceptable for Xpert Xpress SARS-CoV-2/FLU/RSV testing.  Fact Sheet for Patients: BloggerCourse.com  Fact Sheet for Healthcare Providers: SeriousBroker.it  This test is not yet approved  or cleared by the [place] and has been authorized for detection and/or diagnosis of SARS-CoV-2 by FDA under an Emergency Use Authorization (EUA). This EUA will remain in effect (meaning this test can be used) for the duration of the COVID-19 declaration under Section 564(b)(1) of the Act, 21 U.S.C. section 360bbb-3(b)(1), unless the authorization is terminated or revoked.  Performed at [facility], [address], [place], [place] [address]     Radiology Studies: No results found.  Scheduled Meds:  amitriptyline  50 mg Oral QHS   aspirin EC  81 mg Oral BID   Chlorhexidine Gluconate Cloth  6 each Topical Q0600   docusate sodium  100 mg Oral BID   ferrous sulfate  325 mg Oral BID WC   insulin aspart  0-15 Units Subcutaneous TID WC   insulin aspart  0-5 Units Subcutaneous QHS   insulin glargine-yfgn  25 Units Subcutaneous QHS   metoprolol tartrate  25 mg Oral BID   multivitamin with minerals  1 tablet Oral Daily   oxyCODONE  15 mg Oral Q12H   pantoprazole  40 mg Oral BID AC   pregabalin  75 mg Oral BID   Continuous Infusions:  lactated ringers 75 mL/hr at 03/15/21 1424     LOS: 5 days    Time spent: 35 MINS    Megen Madewell, MD [facility]   If 7PM-7AM, please contact night-coverage

## 2021-03-17 NOTE — Progress Notes (Signed)
     Subjective: 5 Days Post-Op Procedure(s) (LRB): INTRAMEDULLARY (IM) NAIL TIBIAL (Left) Awake, alert and oriented x 4. PT OT with good progress in transition. Taught concern of transitions Patient reports pain as moderate.    Objective:   VITALS:  Temp:  [97.9 F (36.6 C)-98.5 F (36.9 C)] 98.5 F (36.9 C) (11/11 0335) Pulse Rate:  [91-95] 95 (11/11 0335) Resp:  [18-19] 19 (11/11 0335) BP: (83-113)/(59-76) 113/76 (11/11 0335) SpO2:  [95 %-100 %] 95 % (11/11 0335)  Neurologically intact ABD soft Neurovascular intact Sensation intact distally Intact pulses distally Dorsiflexion/Plantar flexion intact Incision: dressing C/D/I and no drainage   LABS Recent Labs    03/15/21 0236 03/16/21 0303  HGB 8.9* 8.8*  WBC 4.7 4.0  PLT 126* 116*   Recent Labs    03/15/21 0236 03/16/21 0303  NA 129* 127*  K 4.9 4.9  CL 96* 97*  CO2 26 23  BUN 23* 21*  CREATININE 1.25* 1.18*  GLUCOSE 108* 98   Recent Labs    03/16/21 1112  INR 1.5*     Assessment/Plan: 5 Days Post-Op Procedure(s) (LRB): INTRAMEDULLARY (IM) NAIL TIBIAL (Left)  Advance diet Discharge home with home health She is stable from medicine stand point. Orthopaedically she is stable    Vira Browns 03/17/2021, 12:10 PM Patient ID: Mallory Stewart, female   DOB: 06-14-1960, 60 y.o.   MRN: [id number]

## 2021-03-19 ENCOUNTER — Other Ambulatory Visit: Payer: Self-pay | Admitting: Family Medicine

## 2021-03-19 NOTE — Telephone Encounter (Signed)
Last office visit 08/26/20 for foot injury.  Last refilled 11/08/20 for #20 with no refills.  No future appointments with PCP.

## 2021-03-21 ENCOUNTER — Telehealth: Payer: Self-pay

## 2021-03-21 NOTE — Telephone Encounter (Signed)
Transition Care Management Follow-up Telephone Call Date of discharge and from where: 03/29/2021 / [facility] How have you been since you were released from the hospital? " Doing ok." Any questions or concerns? No  Items Reviewed: Did the pt receive and understand the discharge instructions provided? Yes  Medications obtained and verified? Yes  Other? No  Any new allergies since your discharge? No  Dietary orders reviewed? Yes " on heart healthy diet"  Do you have support at home? Yes   Home Care and Equipment/Supplies: Were home health services ordered? yes If so, what is the name of the agency? [facility]   Has the agency set up a time to come to the patient's home? Patient states she is waiting for call back from [facility] regarding start of service date Were any new equipment or medical supplies ordered?  No What is the name of the medical supply agency? N/a Patient was able to obtain own medical equipment: Shower chair, wheelchair, transfer board. Were you able to get the supplies/equipment? not applicable Do you have any questions related to the use of the equipment or supplies? No  Functional Questionnaire: (I = Independent and D = Dependent) ADLs: I  Bathing/Dressing- I  Meal Prep- I  Eating- I  Maintaining continence- I  Transferring/Ambulation- I  Managing Meds- I  Follow up appointments reviewed:  PCP Hospital f/u appt confirmed? No  Scheduled to see  Specialist Hospital f/u appt confirmed? Yes  Scheduled to see Dr. Vira Browns on 03/29/2021 @ 9:30 am. Are transportation arrangements needed? No  If their condition worsens, is the pt aware to call PCP or go to the Emergency Dept.? Yes Was the patient provided with contact information for the PCP's office or ED? Yes Was to pt encouraged to call back with questions or concerns? Yes  George Ina RN,BSN,CCM RN Case Manager [facility]  [phone]

## 2021-03-21 NOTE — Telephone Encounter (Signed)
Not intended as long-term medicaiton.Marland Kitchen if pt having issues with anxiety... make appt for re-evaluation.

## 2021-03-22 NOTE — Telephone Encounter (Signed)
[name] notified as instructed by telephone.  Virtual visit scheduled for 03/24/2021 at 9:00 am with Dr. Ermalene Searing to discuss refill and going out on STD.

## 2021-03-24 ENCOUNTER — Other Ambulatory Visit: Payer: Self-pay

## 2021-03-24 ENCOUNTER — Telehealth: Payer: Self-pay | Admitting: Internal Medicine

## 2021-03-24 ENCOUNTER — Telehealth (INDEPENDENT_AMBULATORY_CARE_PROVIDER_SITE_OTHER): Payer: 59 | Admitting: Family Medicine

## 2021-03-24 ENCOUNTER — Telehealth (INDEPENDENT_AMBULATORY_CARE_PROVIDER_SITE_OTHER): Payer: Self-pay | Admitting: Internal Medicine

## 2021-03-24 ENCOUNTER — Telehealth: Payer: Self-pay | Admitting: *Deleted

## 2021-03-24 ENCOUNTER — Encounter: Payer: Self-pay | Admitting: Internal Medicine

## 2021-03-24 ENCOUNTER — Encounter: Payer: Self-pay | Admitting: Family Medicine

## 2021-03-24 VITALS — Ht 69.0 in

## 2021-03-24 VITALS — BP 122/77 | Ht 69.0 in | Wt 165.0 lb

## 2021-03-24 DIAGNOSIS — E1165 Type 2 diabetes mellitus with hyperglycemia: Secondary | ICD-10-CM

## 2021-03-24 DIAGNOSIS — F411 Generalized anxiety disorder: Secondary | ICD-10-CM

## 2021-03-24 DIAGNOSIS — E1159 Type 2 diabetes mellitus with other circulatory complications: Secondary | ICD-10-CM | POA: Diagnosis not present

## 2021-03-24 DIAGNOSIS — Z794 Long term (current) use of insulin: Secondary | ICD-10-CM

## 2021-03-24 DIAGNOSIS — Z560 Unemployment, unspecified: Secondary | ICD-10-CM

## 2021-03-24 DIAGNOSIS — E1142 Type 2 diabetes mellitus with diabetic polyneuropathy: Secondary | ICD-10-CM

## 2021-03-24 DIAGNOSIS — K7031 Alcoholic cirrhosis of liver with ascites: Secondary | ICD-10-CM

## 2021-03-24 MED ORDER — AMITRIPTYLINE HCL 75 MG PO TABS: 75.0000 mg | ORAL_TABLET | Freq: Every day | ORAL | 5 refills | Status: DC

## 2021-03-24 MED ORDER — EMPAGLIFLOZIN 25 MG PO TABS: 25.0000 mg | ORAL_TABLET | Freq: Every day | ORAL | 3 refills | Status: DC

## 2021-03-24 MED ORDER — ALPRAZOLAM 0.25 MG PO TABS: ORAL_TABLET | ORAL | 0 refills | Status: DC

## 2021-03-24 MED ORDER — TOUJEO SOLOSTAR 300 UNIT/ML ~~LOC~~ SOPN: 30.0000 [IU] | PEN_INJECTOR | Freq: Every day | SUBCUTANEOUS | 3 refills | Status: DC

## 2021-03-24 MED ORDER — INSULIN PEN NEEDLE 32G X 4 MM MISC: 1.0000 | Freq: Every day | 3 refills | Status: DC

## 2021-03-24 NOTE — Telephone Encounter (Signed)
Pt states that she broke her "other" leg last week and the ortho Dr. Juliette Alcide her diuretics. Pt states that her blood pressure  and Blood sugar was low while in hospital. Pt stated that she wanted Dr. Leone Payor to know and if it is Dr. Leone Payor recommendations to stay off the Diuretics. Pt stated that she did NOT feel light headed or dizzy before she has fallen both times.  Please Advise

## 2021-03-24 NOTE — Telephone Encounter (Signed)
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  She has history of cirrhosis   She needs to have CBC, and be met done on Tuesday, under Dr. Celesta Aver name   We are putting her back on low-dose Lasix 20 and Aldactone 50 milligrams daily  I will switch to Corgard.   Needs an office visit with Dr. Carlean Purl preferably also by her request apparently she has an appointment on December 14 but hopefully can be seen sooner Dr. Katha Cabal was trying to find a place to squeeze her in-thanks

## 2021-03-24 NOTE — Telephone Encounter (Signed)
Patient called stating in addition to breaking her ankle, she has not also broken her leg.  The hospital doctor discontinued her diuretics and wanted to know if that was Dr. Marvell Fuller intention for her to discontinue them.  Please call and advise.

## 2021-03-24 NOTE — Assessment & Plan Note (Signed)
Inadequate control  Increase elavil to 75 mg dat beditme for leg pain, neuropathy, insomnia as well as GAD.   Can use alprazolam prn on limited basis.  She refused counseling.  Follow up in 4 weeks.. may need change to SSRI.

## 2021-03-24 NOTE — Progress Notes (Signed)
Virtual Visit via Video Note  I connected with Mallory Stewart on 03/24/21 at 10:50 am  by a video enabled telemedicine application and verified that I am speaking with the correct person using two identifiers.   I discussed the limitations of evaluation and management by telemedicine and the availability of in person appointments. The patient expressed understanding and agreed to proceed.   -Location of the patient : Home -Location of the provider : Office -The names of all persons participating in the telemedicine service : Pt and myself         Name: Mallory Stewart  Age/ Sex: 60 y.o., female   MRN/ DOB: [id number], 05-16-1960     PCP: [name], MD   Reason for Endocrinology Evaluation: Type 2 Diabetes Mellitus  Initial Endocrine Consultative Visit: 08/22/2018    PATIENT IDENTIFIER: Mallory Stewart is a 60 y.o. female with a past medical history of HTN, T2DM , chronic pancreatitis and gastric outlet obstruction and alcoholic cirrhosis. The patient has followed with Endocrinology clinic since 08/22/2018 for consultative assistance with management of her diabetes.  DIABETIC HISTORY:  Mallory Stewart was with  T2DM in 2010.Pt had hospital admission in 2014 for pancreatitis secondary to ETOH intake, with another one in 2018 and 2020.  Insulin started in 07/2018 . Her hemoglobin A1c has ranged from 5.4% in 2016, peaking at 13.3%  In 2020.  Prandial insulin started 08/2018 Farxiga added in 09/2018  SUBJECTIVE:   During the last visit (07/13/2020): A1c 5.8% we continued tresiba Farxiga      Today (03/24/2021): Mallory Stewart is here for a follow up on diabetes management. She has not been here in 8 months.   She checks her blood sugars 1 times daily. The patient has had hypoglycemic episodes since the last clinic visit, which typically occur in the morning.    She has had right ankle fracture followed with a tibial and fibular fracture following a fall She is S/P sx  for left tibial and fibular fracture 03/2021 She is status post paracentesis last week  The patient was on furosemide and spironolactone but per patient this was discontinued during her hospitalization a couple weeks ago  Per patient she had a normal bone density scan, she is not on calcium or vitamin D   Somehow she has been taking higher dose of Toujeo  HOME DIABETES REGIMEN:  Toujeo 30 units daily -has been taking 34 units Jardiance 25 mg daily     Statin: No ACE-I/ARB: yes    GLUCOSE LOG:  Fasting 69-170 mg/DL     DIABETIC COMPLICATIONS: Microvascular complications:  Neuropathic  Denies: CKD Last Eye Exam: Completed 2018  Macrovascular complications:  Denies: CAD, CVA, PVD   HISTORY:  Past Medical History:  Past Medical History:  Diagnosis Date   Alcohol-induced chronic pancreatitis ([place])    B12 deficiency    Concussion    DDD (degenerative disc disease), cervical    Diabetes ([place])    DKA, type 2 ([place])    Gastric outlet obstruction 08/12/2018   Hypertension    Neuropathy    Pancreatic pseudocyst/cyst 05/06/2013   Seasonal allergies    Past Surgical History:  Past Surgical History:  Procedure Laterality Date   ANKLE ARTHROSCOPY Right 12/20/2020   Procedure: RIGHT ANKLE ARTHROSCOPIC DEBRIDEMENT;  Surgeon: Newt Minion, MD;  Location: [facility];  Service: Orthopedics;  Laterality: Right;   BIOPSY  01/19/2019   Procedure: BIOPSY;  Surgeon: Rush Landmark Telford Nab., MD;  Location: [facility];  Service: Gastroenterology;;   CHOLECYSTECTOMY N/A 07/28/2016   Procedure: LAPAROSCOPIC CHOLECYSTECTOMY WITH INTRAOPERATIVE CHOLANGIOGRAM;  Surgeon: Mickeal Skinner, MD;  Location: [facility];  Service: General;  Laterality: N/A;   ESOPHAGOGASTRODUODENOSCOPY (EGD) WITH PROPOFOL N/A 08/13/2018   Procedure: ESOPHAGOGASTRODUODENOSCOPY (EGD) WITH PROPOFOL;  Surgeon: Irving Copas., MD;  Location: [facility];  Service: Gastroenterology;  Laterality:  N/A;   ESOPHAGOGASTRODUODENOSCOPY (EGD) WITH PROPOFOL N/A 01/19/2019   Procedure: ESOPHAGOGASTRODUODENOSCOPY (EGD) WITH PROPOFOL;  Surgeon: Rush Landmark Telford Nab., MD;  Location: [facility];  Service: Gastroenterology;  Laterality: N/A;   IR PARACENTESIS  08/08/2020   IR PARACENTESIS  03/13/2021   LEEP  1990's   ORIF FOOT FRACTURE  09/2008   L 5th metatarsal   REFRACTIVE SURGERY  2000   TIBIA IM NAIL INSERTION Left 03/12/2021   Procedure: INTRAMEDULLARY (IM) NAIL TIBIAL;  Surgeon: Jessy Oto, MD;  Location: [facility];  Service: Orthopedics;  Laterality: Left;   TONSILLECTOMY     UPPER ESOPHAGEAL ENDOSCOPIC ULTRASOUND (EUS) N/A 08/13/2018   Procedure: UPPER ESOPHAGEAL ENDOSCOPIC ULTRASOUND (EUS);  Surgeon: Irving Copas., MD;  Location: [facility];  Service: Gastroenterology;  Laterality: N/A;   UPPER ESOPHAGEAL ENDOSCOPIC ULTRASOUND (EUS) N/A 01/19/2019   Procedure: UPPER ESOPHAGEAL ENDOSCOPIC ULTRASOUND (EUS);  Surgeon: Irving Copas., MD;  Location: [facility];  Service: Gastroenterology;  Laterality: N/A;   Social History:  reports that she has never smoked. She has never used smokeless tobacco. She reports that she does not currently use alcohol after a past usage of about 7.0 standard drinks per week. She reports that she does not use drugs. Family History:  Family History  Problem Relation Age of Onset   Other Mother        tachycardia.Marland KitchenMarland Kitchen?afib   Stroke Father        after hernia suegery   Prostate cancer Father    Other Father        global transient amnesia, unclear source   Atrial fibrillation Sister    Healthy Brother    Healthy Brother    Coronary artery disease Paternal Grandmother    Heart attack Paternal Grandmother 80   Brain cancer Maternal Grandfather        ?   Cancer Paternal Grandfather        ?   Breast cancer Maternal Grandmother      HOME MEDICATIONS: Allergies as of 03/24/2021       Reactions   Gluten Meal Other (See Comments)    Stomach distress   Zocor [simvastatin] Other (See Comments)   Elevated lfts?        Medication List        Accurate as of March 24, 2021  7:03 AM. If you have any questions, ask your nurse or doctor.          ALPRAZolam 0.25 MG tablet Commonly known as: XANAX TAKE 1 TABLET(0.25 MG) BY MOUTH DAILY AS NEEDED FOR ANXIETY   amitriptyline 50 MG tablet Commonly known as: ELAVIL TAKE 1 TABLET(50 MG) BY MOUTH AT BEDTIME What changed: See the new instructions.   aspirin 81 MG EC tablet Take 1 tablet (81 mg total) by mouth 2 (two) times daily. Swallow whole.   B-D UF III MINI PEN NEEDLES 31G X 5 MM Misc Generic drug: Insulin Pen Needle USE 4 TIMES A DAY AS DIRECTED   cetirizine 10 MG tablet Commonly known as: ZYRTEC Take 10 mg by mouth at bedtime.   docusate sodium 100 MG  capsule Commonly known as: COLACE Take 1 capsule (100 mg total) by mouth 2 (two) times daily.   empagliflozin 25 MG Tabs tablet Commonly known as: Jardiance Take 1 tablet (25 mg total) by mouth daily before breakfast. What changed: when to take this   ferrous sulfate 325 (65 FE) MG tablet Take 1 tablet (325 mg total) by mouth 2 (two) times daily with a meal.   hepatitis A virus (PF) vaccine 1440 EL U/ML injection Commonly known as: HAVRIX (PF) Please Give 1 ml IM on day 0 and repeat in 6 months   lactulose 10 GM/15ML solution Commonly known as: CHRONULAC Take 30 mLs (20 g total) by mouth daily as needed for mild constipation.   metoprolol tartrate 25 MG tablet Commonly known as: LOPRESSOR Take 1 tablet (25 mg total) by mouth 2 (two) times daily.   multivitamin with minerals Tabs tablet Take 1 tablet by mouth daily.   ONE TOUCH ULTRA 2 w/Device Kit Use to check blood sugars two times day   OneTouch Ultra test strip Generic drug: glucose blood CHECK BLOOD SUGAR TWO TIMES DAILY AS DIRECTED   onetouch ultrasoft lancets Use to check blood sugar two times a day.   oxyCODONE 15 mg 12 hr  tablet Commonly known as: OXYCONTIN Take 1 tablet (15 mg total) by mouth every 12 (twelve) hours.   oxyCODONE 5 MG immediate release tablet Commonly known as: Oxy IR/ROXICODONE Take 1-2 tablets (5-10 mg total) by mouth every 4 (four) hours as needed for moderate pain (pain score 4-6).   pantoprazole 40 MG tablet Commonly known as: PROTONIX TAKE 1 TABLET BY MOUTH TWICE DAILY BEFORE A MEAL What changed: when to take this   Toujeo SoloStar 300 UNIT/ML Solostar Pen Generic drug: insulin glargine (1 Unit Dial) Inject 30 Units into the skin daily at 2 PM.           DATA REVIEWED:  Lab Results  Component Value Date   HGBA1C 7.9 (H) 03/12/2021   HGBA1C 7.8 (H) 03/12/2021   HGBA1C 5.8 06/16/2020   Lab Results  Component Value Date   MICROALBUR 1.6 08/19/2019   LDLCALC 82 10/16/2018   CREATININE 1.18 (H) 03/16/2021   Lab Results  Component Value Date   MICRALBCREAT 6.6 08/19/2019     Lab Results  Component Value Date   CHOL 168 06/16/2020   HDL 18.10 (L) 06/16/2020   LDLCALC 82 10/16/2018   LDLDIRECT 129.0 06/16/2020   TRIG 236.0 (H) 06/16/2020   CHOLHDL 9 06/16/2020        Latest Reference Range & Units 03/16/21 03:03  Sodium 135 - 145 mmol/L 127 (L)  Potassium 3.5 - 5.1 mmol/L 4.9  Chloride 98 - 111 mmol/L 97 (L)  CO2 22 - 32 mmol/L 23  Glucose 70 - 99 mg/dL 98  BUN 6 - 20 mg/dL 21 (H)  Creatinine 0.44 - 1.00 mg/dL 1.18 (H)  Calcium 8.9 - 10.3 mg/dL 8.6 (L)  Anion gap 5 - 15  7  GFR, Estimated >60 mL/min 54 (L)  WBC 4.0 - 10.5 K/uL 4.0  RBC 3.87 - 5.11 MIL/uL 2.93 (L)  Hemoglobin 12.0 - 15.0 g/dL 8.8 (L)  HCT 36.0 - 46.0 % 26.7 (L)     ASSESSMENT / PLAN / RECOMMENDATIONS:   1) Type 2 Diabetes Mellitus, Sub-OPtimally Controlled , With Neuropathic complications - Most recent A1c of 7.9 %. Goal A1c < 7.0 %.     -A1c trending up, patient with postprandial hyperglycemia, we have discussed that  this could be due to history of pancreatitis and decreased  endogenous production of insulin. -We discussed that due to her previous history we are limited on glycemic agents -We discussed options to include pioglitazone, she is not a candidate for this due to liver cirrhosis and edema.  We also discussed prandial insulin as an option but she is not keen on this. -In the meantime the patient advised to reduce carbohydrate intake as much as possible - She is intolerant to Metformin - She is not a candidate for GLP-1 agonists, or DPP-4 inhibitors due to hx of pancreatitis.  -I have advised her to reduce her Toujeo due to hypoglycemia    MEDICATIONS: -Decrease Toujeo to 30 units daily -Continue Jardiance at 25 mg daily with breakfast    EDUCATION / INSTRUCTIONS: BG monitoring instructions: Patient is instructed to check her blood sugars 1 times a day. Call [facility] if: BG persistently < 70  I reviewed the Rule of 15 for the treatment of hypoglycemia in detail with the patient. Literature supplied.   2) Diabetic complications:  Eye: Does not have known diabetic retinopathy.  Neuro/ Feet: Does have known diabetic peripheral neuropathy. Renal: Patient does not have known baseline CKD. She is on an ACEI/ARB at present.    4) Liver cirrhosis:   -She was on furosemide and spironolactone through GI -She is status post paracentesis last week -Patient states she has not been on any diuretic therapy since discharge from the hospital, I did advise the patient to check with GI , if diuretics are still needed   5) History of leg fracture:  -Multifactorial -Patient advised to start OTC calcium/vitamin D 600 mg twice daily -Emphasized the importance of calcium and vitamin D for bone health  F/U in 6 months   Signed electronically by: Mack Guise, MD  [facility] End[facility]  [address] [place], [place] [address] Phone: [phone] FAX: [phone]   CC: [name], MD [place] [address] Phone: [phone]  Fax: [phone]  Return to Endocrinology clinic as below: Future Appointments  Date Time Provider [place]  03/24/2021  9:00 AM [name], MD [facility]  03/24/2021 10:50 AM Marnell Mcdaniel, Melanie Crazier, MD [facility] None  03/29/2021  9:30 AM Jessy Oto, MD [facility] None  04/18/2021  2:30 PM Gatha Mayer, MD [facility]  04/20/2021  3:15 PM [facility] NURSE [facility]

## 2021-03-24 NOTE — Assessment & Plan Note (Signed)
A1C almost at [facility] on current regimen. Followed by ENDo.

## 2021-03-24 NOTE — Progress Notes (Signed)
VIRTUAL VISIT Due to national recommendations of social distancing due to [place] 19, a virtual visit is felt to be most appropriate for this patient at this time.   I connected with the patient on 03/24/21 at  9:00 AM EST by virtual telehealth platform and verified that I am speaking with the correct person using two identifiers.   I discussed the limitations, risks, security and privacy concerns of performing an evaluation and management service by  virtual telehealth platform and the availability of in person appointments. I also discussed with the patient that there may be a patient responsible charge related to this service. The patient expressed understanding and agreed to proceed.  Patient location: Home Provider Location: [facility] Participants: Eliezer Lofts and Lynda Rainwater   Chief Complaint  Patient presents with   Medication Refill    Alprazolam    History of Present Illness:  60 year old female with  Diabetes, HTN, ETOH liver cirrhosis , chronic pancreatitis presents for GAD and  medication refill.   She has had frequent falls  due to vertigo and peripheral neuropathy.  Bone density was normal.   She quit her job, this contributes to anxiety.  Her current issue contribute to her anxiety. Ongoing ver  On elavil 50 mg at bedtime... she still has issues sleeping... trouble shutting brain off  but also pain in legs from fractures.  She is using alprazolam as needed.. 11/2020.Marland Kitchen does not have any to take.  She feels her anxiety is more situational due to broken leg and job issues, but does have daily.  She is not interested in speaking with a counselor.   GAD 7 : Generalized Anxiety Score 03/24/2021  Nervous, Anxious, on Edge 3  Control/stop worrying 3  Worry too much - different things 3  Trouble relaxing 3  Restless 3  Easily annoyed or irritable 3  Afraid - awful might happen 0  Total GAD 7 Score 18  Anxiety Difficulty Somewhat difficult   PHQ9 SCORE  ONLY 06/21/2020 10/24/2018 05/17/2017  PHQ-9 Total Score 0 0 0     DM.. has appt today.  Lab Results  Component Value Date   HGBA1C 7.9 (H) 03/12/2021    She also had questions about short term disability... but she is not currently working... with 2 broken legs she cannot look for work. ? Services available for her?  Will see if she qualifies for social worker services.   COVID 19 screen No recent travel or known exposure to COVID19 The patient denies respiratory symptoms of COVID 19 at this time.  The importance of social distancing was discussed today.   Review of Systems  Constitutional:  Negative for chills and fever.  HENT:  Negative for congestion and ear pain.   Eyes:  Negative for pain and redness.  Respiratory:  Negative for cough and shortness of breath.   Cardiovascular:  Negative for chest pain, palpitations and leg swelling.  Gastrointestinal:  Negative for abdominal pain, blood in stool, constipation, diarrhea, nausea and vomiting.  Genitourinary:  Negative for dysuria.  Musculoskeletal:  Positive for falls. Negative for myalgias.  Skin:  Negative for rash.  Neurological:  Negative for dizziness.  Psychiatric/Behavioral:  Negative for depression. The patient is nervous/anxious and has insomnia.      Past Medical History:  Diagnosis Date   Alcohol-induced chronic pancreatitis ([place])    B12 deficiency    Concussion    DDD (degenerative disc disease), cervical    Diabetes ([place])  DKA, type 2 ([place])    Gastric outlet obstruction 08/12/2018   Hypertension    Neuropathy    Pancreatic pseudocyst/cyst 05/06/2013   Seasonal allergies     reports that she has never smoked. She has never used smokeless tobacco. She reports that she does not currently use alcohol after a past usage of about 7.0 standard drinks per week. She reports that she does not use drugs.   Current Outpatient Medications:    ALPRAZolam (XANAX) 0.25 MG tablet, TAKE 1 TABLET(0.25 MG) BY MOUTH DAILY AS  NEEDED FOR ANXIETY, Disp: 20 tablet, Rfl: 0   amitriptyline (ELAVIL) 50 MG tablet, TAKE 1 TABLET(50 MG) BY MOUTH AT BEDTIME, Disp: 30 tablet, Rfl: 3   aspirin EC 81 MG EC tablet, Take 1 tablet (81 mg total) by mouth 2 (two) times daily. Swallow whole., Disp: 30 tablet, Rfl: 11   B-D UF III MINI PEN NEEDLES 31G X 5 MM MISC, USE 4 TIMES A DAY AS DIRECTED, Disp: 100 each, Rfl: 3   Blood Glucose Monitoring Suppl (ONE TOUCH ULTRA 2) w/Device KIT, Use to check blood sugars two times day, Disp: 1 each, Rfl: 0   cetirizine (ZYRTEC) 10 MG tablet, Take 10 mg by mouth at bedtime., Disp: , Rfl:    empagliflozin (JARDIANCE) 25 MG TABS tablet, Take 1 tablet (25 mg total) by mouth daily before breakfast., Disp: 90 tablet, Rfl: 3   ferrous sulfate 325 (65 FE) MG tablet, Take 1 tablet (325 mg total) by mouth 2 (two) times daily with a meal., Disp: 60 tablet, Rfl: 3   insulin glargine, 1 Unit Dial, (TOUJEO SOLOSTAR) 300 UNIT/ML Solostar Pen, Inject 34 Units into the skin daily at 2 PM., Disp: , Rfl:    lactulose (CHRONULAC) 10 GM/15ML solution, Take 30 mLs (20 g total) by mouth daily as needed for mild constipation., Disp: 236 mL, Rfl: 0   Lancets (ONETOUCH ULTRASOFT) lancets, Use to check blood sugar two times a day., Disp: 200 each, Rfl: 3   metoprolol tartrate (LOPRESSOR) 25 MG tablet, Take 1 tablet (25 mg total) by mouth 2 (two) times daily., Disp: 60 tablet, Rfl: 0   Multiple Vitamin (MULTIVITAMIN WITH MINERALS) TABS tablet, Take 1 tablet by mouth daily., Disp: , Rfl:    ONETOUCH ULTRA test strip, CHECK BLOOD SUGAR TWO TIMES DAILY AS DIRECTED, Disp: 100 strip, Rfl: 7   oxyCODONE (OXY IR/ROXICODONE) 5 MG immediate release tablet, Take 1-2 tablets (5-10 mg total) by mouth every 4 (four) hours as needed for moderate pain (pain score 4-6)., Disp: 40 tablet, Rfl: 0   oxyCODONE (OXYCONTIN) 15 mg 12 hr tablet, Take 1 tablet (15 mg total) by mouth every 12 (twelve) hours., Disp: 14 tablet, Rfl: 0   pantoprazole  (PROTONIX) 40 MG tablet, TAKE 1 TABLET BY MOUTH TWICE DAILY BEFORE A MEAL, Disp: 180 tablet, Rfl: 1   Observations/Objective: Height 5\' 9"  (1.753 m).  Physical Exam  Physical Exam Constitutional:      General: The patient is not in acute distress. Pulmonary:     Effort: Pulmonary effort is normal. No respiratory distress.  Neurological:     Mental Status: The patient is alert and oriented to person, place, and time.  Psychiatric:        Mood and Affect: Mood normal.        Behavior: Behavior normal.   Assessment and Plan Problem List Items Addressed This Visit     Diabetes mellitus with circulatory complication, HTN (HCC)    A1C  almost at [facility] on current regimen. Followed by ENDo.      GAD (generalized anxiety disorder) - Primary    Inadequate control  Increase elavil to 75 mg dat beditme for leg pain, neuropathy, insomnia as well as GAD.   Can use alprazolam prn on limited basis.  She refused counseling.  Follow up in 4 weeks.. may need change to SSRI.      Relevant Medications   amitriptyline (ELAVIL) 75 MG tablet   ALPRAZolam (XANAX) 0.25 MG tablet   Orders Placed This Encounter  Procedures   AMB Referral to [facility] Coordinaton    Referral Priority:   Routine    Referral Type:   Consultation    Referral Reason:   Care Coordination    Number of Visits Requested:   1      I discussed the assessment and treatment plan with the patient. The patient was provided an opportunity to ask questions and all were answered. The patient agreed with the plan and demonstrated an understanding of the instructions.   The patient was advised to call back or seek an in-person evaluation if the symptoms worsen or if the condition fails to improve as anticipated.     Eliezer Lofts, MD

## 2021-03-24 NOTE — Chronic Care Management (AMB) (Signed)
  Chronic Care Management   Outreach Note  03/24/2021 Name: [name] MRN: [id number] DOB: [birth year]  Mallory Stewart is a 60 y.o. year old female who is a primary care patient of Bedsole, Amy E, MD. I reached out to [name] by phone today in response to a referral sent by Ms. Karl Pock Mennen's primary care provider.  An unsuccessful telephone outreach was attempted today. The patient was referred to the case management team for assistance with care management and care coordination.   Follow Up Plan: A HIPAA compliant phone message was left for the patient providing contact information and requesting a return call.  If patient returns call to provider office, please advise to call Embedded Care Management Care Guide Mckenze Slone at [phone]  Burman Nieves, CCMA Care Guide, Embedded Care Coordination [facility]  Care Management  Direct Dial: [phone]

## 2021-03-24 NOTE — Telephone Encounter (Signed)
Stay on those meds and we will reassess Tuesday

## 2021-03-24 NOTE — Telephone Encounter (Signed)
Lab entered into Epic: Pt made aware. Pt stated that she would come by Monday to get Labs drawn. Pt scheduled for 03/28/2021 @ 1050 with Dr. Leone Payor. Pt made aware.  Pt states that currently she is  taking the following Medications :  Metoprolol; Protonix; oxycodone, Asprin;  colace; Iron, Calcium, Lactulose, Amitriptyline Pt states that she is NOT taking any Diuretics.

## 2021-03-27 ENCOUNTER — Telehealth: Payer: Self-pay | Admitting: Specialist

## 2021-03-27 ENCOUNTER — Other Ambulatory Visit (INDEPENDENT_AMBULATORY_CARE_PROVIDER_SITE_OTHER): Payer: 59

## 2021-03-27 DIAGNOSIS — R932 Abnormal findings on diagnostic imaging of liver and biliary tract: Secondary | ICD-10-CM

## 2021-03-27 DIAGNOSIS — K760 Fatty (change of) liver, not elsewhere classified: Secondary | ICD-10-CM | POA: Diagnosis not present

## 2021-03-27 LAB — COMPREHENSIVE METABOLIC PANEL
ALT: 13 U/L (ref 0–35)
AST: 38 U/L — ABNORMAL HIGH (ref 0–37)
Albumin: 3.3 g/dL — ABNORMAL LOW (ref 3.5–5.2)
Alkaline Phosphatase: 245 U/L — ABNORMAL HIGH (ref 39–117)
BUN: 8 mg/dL (ref 6–23)
CO2: 26 mEq/L (ref 19–32)
Calcium: 8.8 mg/dL (ref 8.4–10.5)
Chloride: 100 mEq/L (ref 96–112)
Creatinine, Ser: 0.9 mg/dL (ref 0.40–1.20)
GFR: 70.63 mL/min (ref >60.00)
Glucose, Bld: 105 mg/dL — ABNORMAL HIGH (ref 70–99)
Potassium: 3.8 mEq/L (ref 3.5–5.1)
Sodium: 133 mEq/L — ABNORMAL LOW (ref 135–145)
Total Bilirubin: 1.3 mg/dL — ABNORMAL HIGH (ref 0.2–1.2)
Total Protein: 6.7 g/dL (ref 6.0–8.3)

## 2021-03-27 LAB — CBC WITH DIFFERENTIAL/PLATELET
Basophils Absolute: 0.1 10*3/uL (ref 0.0–0.1)
Basophils Relative: 0.8 % (ref 0.0–3.0)
Eosinophils Absolute: 0.2 10*3/uL (ref 0.0–0.7)
Eosinophils Relative: 2.6 % (ref 0.0–5.0)
HCT: 31.7 % — ABNORMAL LOW (ref 36.0–46.0)
Hemoglobin: 10.5 g/dL — ABNORMAL LOW (ref 12.0–15.0)
Lymphocytes Relative: 16.8 % (ref 12.0–46.0)
Lymphs Abs: 1 10*3/uL (ref 0.7–4.0)
MCHC: 33 g/dL (ref 30.0–36.0)
MCV: 91.1 fl (ref 78.0–100.0)
Monocytes Absolute: 0.3 10*3/uL (ref 0.1–1.0)
Monocytes Relative: 5.6 % (ref 3.0–12.0)
Neutro Abs: 4.4 10*3/uL (ref 1.4–7.7)
Neutrophils Relative %: 74.2 % (ref 43.0–77.0)
Platelets: 288 10*3/uL (ref 150.0–400.0)
RBC: 3.48 Mil/uL — ABNORMAL LOW (ref 3.87–5.11)
RDW: 14.6 % (ref 11.5–15.5)
WBC: 6 10*3/uL (ref 4.0–10.5)

## 2021-03-27 NOTE — Telephone Encounter (Signed)
Pt made aware of Dr. Leone Payor recommendations. Pt reminded of appointment tomorrow. Pt verbalized understanding with all questions answered.

## 2021-03-27 NOTE — Telephone Encounter (Signed)
Pt called requesting a refill of oxycodone. Pt had surgery a few weeks ago and totally out of pain medication. She is asking for a refill to be sent to pharmacy on file and call her when meds have been sent in. Pt phone number is [phone].

## 2021-03-27 NOTE — Discharge Summary (Signed)
Patient ID: Mallory Stewart MRN: [id number] DOB/AGE: [birth year] 60 y.o.  Admit date: 03/12/2021 Discharge date: 03/17/2021  Admission Diagnoses:  Principal Problem:   Closed fracture of distal end of left tibia, initial encounter Active Problems:   Ascites   Closed fracture of proximal fibula   Displaced spiral fracture of shaft of left tibia, initial encounter for closed fracture   Secondary esophageal varices without bleeding ([place])   Discharge Diagnoses:  Principal Problem:   Closed fracture of distal end of left tibia, initial encounter Active Problems:   Ascites   Closed fracture of proximal fibula   Displaced spiral fracture of shaft of left tibia, initial encounter for closed fracture   Secondary esophageal varices without bleeding ([place])  status post Procedure(s): INTRAMEDULLARY (IM) NAIL TIBIAL  Past Medical History:  Diagnosis Date   Alcohol-induced chronic pancreatitis ([place])    B12 deficiency    Concussion    DDD (degenerative disc disease), cervical    Diabetes ([place])    DKA, type 2 ([place])    Gastric outlet obstruction 08/12/2018   Hypertension    Neuropathy    Pancreatic pseudocyst/cyst 05/06/2013   Seasonal allergies     Surgeries: Procedure(s): INTRAMEDULLARY (IM) NAIL TIBIAL on 03/12/2021   Consultants: Treatment Team:  Jessy Oto, MD Terrilee Croak, MD  Discharged Condition: Improved  Hospital Course: Mallory Stewart is an 60 y.o. female who was admitted 03/12/2021 for operative treatment of Closed fracture of distal end of left tibia, initial encounter. Patient failed conservative treatments (please see the history and physical for the specifics) and had severe unremitting pain that affects sleep, daily activities and work/hobbies. After pre-op clearance, the patient was taken to the operating room on 03/12/2021 and underwent  Procedure(s): INTRAMEDULLARY (IM) NAIL TIBIAL.    Patient was given perioperative antibiotics:  Anti-infectives  (From admission, onward)    Start     Dose/Rate Route Frequency Ordered Stop   03/12/21 2030  ceFAZolin (ANCEF) IVPB 1 g/50 mL premix        1 g 100 mL/hr over 30 Minutes Intravenous Every 6 hours 03/12/21 1947 03/13/21 0852   03/12/21 1300  ceFAZolin (ANCEF) IVPB 2g/100 mL premix        2 g 200 mL/hr over 30 Minutes Intravenous On call to O.R. 03/12/21 1206 03/12/21 1455   03/12/21 1216  ceFAZolin (ANCEF) 2-4 GM/100ML-% IVPB       Note to Pharmacy: Bobbie Stack   : cabinet override      03/12/21 1216 03/12/21 1439        Patient was given sequential compression devices and early ambulation to prevent DVT.   Patient benefited maximally from hospital stay and there were no complications. At the time of discharge, the patient was urinating/moving their bowels without difficulty, tolerating a regular diet, pain is controlled with oral pain medications and they have been cleared by PT/OT.   Recent vital signs: No data found.   Recent laboratory studies: No results for input(s): WBC, HGB, HCT, PLT, NA, K, CL, CO2, BUN, CREATININE, GLUCOSE, INR, CALCIUM in the last 72 hours.  Invalid input(s): PT, 2   Discharge Medications:   Allergies as of 03/17/2021       Reactions   Gluten Meal Other (See Comments)   Stomach distress   Zocor [simvastatin] Other (See Comments)   Elevated lfts?        Medication List     STOP taking these medications    furosemide 40 MG tablet  Commonly known as: Lasix   losartan 100 MG tablet Commonly known as: COZAAR   meloxicam 7.5 MG tablet Commonly known as: MOBIC   oxyCODONE-acetaminophen 5-325 MG tablet Commonly known as: PERCOCET/ROXICET   pregabalin 75 MG capsule Commonly known as: LYRICA   spironolactone 100 MG tablet Commonly known as: Aldactone       TAKE these medications    aspirin 81 MG EC tablet Take 1 tablet (81 mg total) by mouth 2 (two) times daily. Swallow whole.   cetirizine 10 MG tablet Commonly known as:  ZYRTEC Take 10 mg by mouth at bedtime.   ferrous sulfate 325 (65 FE) MG tablet Take 1 tablet (325 mg total) by mouth 2 (two) times daily with a meal.   lactulose 10 GM/15ML solution Commonly known as: CHRONULAC Take 30 mLs (20 g total) by mouth daily as needed for mild constipation.   metoprolol tartrate 25 MG tablet Commonly known as: LOPRESSOR Take 1 tablet (25 mg total) by mouth 2 (two) times daily. What changed:  medication strength See the new instructions.   multivitamin with minerals Tabs tablet Take 1 tablet by mouth daily.   ONE TOUCH ULTRA 2 w/Device Kit Use to check blood sugars two times day   OneTouch Ultra test strip Generic drug: glucose blood CHECK BLOOD SUGAR TWO TIMES DAILY AS DIRECTED   onetouch ultrasoft lancets Use to check blood sugar two times a day.   oxyCODONE 15 mg 12 hr tablet Commonly known as: OXYCONTIN Take 1 tablet (15 mg total) by mouth every 12 (twelve) hours.   oxyCODONE 5 MG immediate release tablet Commonly known as: Oxy IR/ROXICODONE Take 1-2 tablets (5-10 mg total) by mouth every 4 (four) hours as needed for moderate pain (pain score 4-6).   pantoprazole 40 MG tablet Commonly known as: PROTONIX TAKE 1 TABLET BY MOUTH TWICE DAILY BEFORE A MEAL        Diagnostic Studies: DG Tibia/Fibula Left  Result Date: 03/12/2021 CLINICAL DATA:  Left tibial nail. EXAM: LEFT TIBIA AND FIBULA - 2 VIEW COMPARISON:  Radiographs earlier today. FINDINGS: Nine fluoroscopic spot views of the left tibia and fibula obtained in the operating room. Interval tibial intramedullary nail with proximal and distal locking screws. Distal tibial shaft fracture in improved alignment. Proximal fibular shaft fracture is grossly stable in alignment. Fluoroscopy time 3 minutes 47 seconds. Dose 6.78 mGy. IMPRESSION: Fluoroscopic spot views during tibial intramedullary nail placement traversing distal tibial fracture. Electronically Signed   By: Keith Rake M.D.   On:  03/12/2021 17:50   DG Tibia/Fibula Left  Result Date: 03/12/2021 CLINICAL DATA:  Fall EXAM: LEFT TIBIA AND FIBULA - 2 VIEW; LEFT ANKLE COMPLETE - 3+ VIEW; PORTABLE LEFT KNEE - 1-2 VIEW COMPARISON:  None. FINDINGS: There is a laterally displaced oblique fracture of the distal tibial shaft. There is a posteriorly displaced oblique fracture of the proximal fibular shaft. The ankle mortise is approximated.  No joint effusion. The left knee is normal. IMPRESSION: 1. Laterally displaced oblique fracture of the distal tibial shaft. 2. Posteriorly displaced oblique fracture of the proximal fibular shaft. 3. No ankle dislocation. 4. Normal left knee. Electronically Signed   By: Ulyses Jarred M.D.   On: 03/12/2021 02:36   DG Ankle Complete Left  Result Date: 03/12/2021 CLINICAL DATA:  Fall EXAM: LEFT TIBIA AND FIBULA - 2 VIEW; LEFT ANKLE COMPLETE - 3+ VIEW; PORTABLE LEFT KNEE - 1-2 VIEW COMPARISON:  None. FINDINGS: There is a laterally displaced oblique fracture of the  distal tibial shaft. There is a posteriorly displaced oblique fracture of the proximal fibular shaft. The ankle mortise is approximated.  No joint effusion. The left knee is normal. IMPRESSION: 1. Laterally displaced oblique fracture of the distal tibial shaft. 2. Posteriorly displaced oblique fracture of the proximal fibular shaft. 3. No ankle dislocation. 4. Normal left knee. Electronically Signed   By: Ulyses Jarred M.D.   On: 03/12/2021 02:36   US Abdomen Limited  Result Date: 03/12/2021 CLINICAL DATA:  History of cirrhosis, evaluate for possible ascites. EXAM: LIMITED ABDOMEN ULTRASOUND FOR ASCITES TECHNIQUE: Limited ultrasound survey for ascites was performed in all four abdominal quadrants. COMPARISON:  None. FINDINGS: Ascites is noted all 4 abdominal quadrants. The degree of ascites is large and certainly amenable to paracentesis as necessary. IMPRESSION: Considerable abdominal ascites. This would be amenable to percutaneous paracentesis as  necessary. Electronically Signed   By: Inez Catalina M.D.   On: 03/12/2021 21:46   Chest Portable 1 View  Result Date: 03/12/2021 CLINICAL DATA:  Preop evaluation for upcoming tibial surgery, initial encounter EXAM: PORTABLE CHEST 1 VIEW COMPARISON:  07/21/2018 FINDINGS: Cardiac shadow is within normal limits. Lungs are well aerated bilaterally. No focal infiltrate or sizable effusion is seen. No bony abnormality is noted. IMPRESSION: No active disease. Electronically Signed   By: Inez Catalina M.D.   On: 03/12/2021 04:04   DG Knee Left Port  Result Date: 03/12/2021 CLINICAL DATA:  Fall EXAM: LEFT TIBIA AND FIBULA - 2 VIEW; LEFT ANKLE COMPLETE - 3+ VIEW; PORTABLE LEFT KNEE - 1-2 VIEW COMPARISON:  None. FINDINGS: There is a laterally displaced oblique fracture of the distal tibial shaft. There is a posteriorly displaced oblique fracture of the proximal fibular shaft. The ankle mortise is approximated.  No joint effusion. The left knee is normal. IMPRESSION: 1. Laterally displaced oblique fracture of the distal tibial shaft. 2. Posteriorly displaced oblique fracture of the proximal fibular shaft. 3. No ankle dislocation. 4. Normal left knee. Electronically Signed   By: Ulyses Jarred M.D.   On: 03/12/2021 02:36   DG C-Arm 1-60 Min-No Report  Result Date: 03/12/2021 Fluoroscopy was utilized by the requesting physician.  No radiographic interpretation.   DG C-Arm 1-60 Min-No Report  Result Date: 03/12/2021 Fluoroscopy was utilized by the requesting physician.  No radiographic interpretation.   DG C-Arm 1-60 Min-No Report  Result Date: 03/12/2021 Fluoroscopy was utilized by the requesting physician.  No radiographic interpretation.   IR Paracentesis  Result Date: 03/13/2021 INDICATION: Patient with history of cirrhosis with recurrent ascites. Request is for therapeutic paracentesis EXAM: ULTRASOUND GUIDED THERAPEUTIC PARACENTESIS MEDICATIONS: Lidocaine 1% 10 mL COMPLICATIONS: None immediate.  PROCEDURE: Informed written consent was obtained from the patient after a discussion of the risks, benefits and alternatives to treatment. A timeout was performed prior to the initiation of the procedure. Initial ultrasound scanning demonstrates a large amount of ascites within the right lower abdominal quadrant. The right lower abdomen was prepped and draped in the usual sterile fashion. 1% lidocaine was used for local anesthesia. Following this, a 19 gauge, 7-cm, Yueh catheter was introduced. An ultrasound image was saved for documentation purposes. The paracentesis was performed. The catheter was removed and a dressing was applied. The patient tolerated the procedure well without immediate post procedural complication. FINDINGS: A total of approximately 5.6 L of straw-colored fluid was removed. IMPRESSION: Successful ultrasound-guided therapeutic paracentesis yielding 5.6 liters of peritoneal fluid. Read by: Rushie Nyhan, NP Electronically Signed   By: Scherrie Gerlach.D.  On: 03/13/2021 14:33    Discharge Instructions     Call MD / Call 911   Complete by: As directed    If you experience chest pain or shortness of breath, CALL 911 and be transported to the hospital emergency room.  If you develope a fever above 101 F, pus (white drainage) or increased drainage or redness at the wound, or calf pain, call your surgeon's office.   Constipation Prevention   Complete by: As directed    Drink plenty of fluids.  Prune juice may be helpful.  You may use a stool softener, such as Colace (over the counter) 100 mg twice a day.  Use MiraLax (over the counter) for constipation as needed.   Diet - low sodium heart healthy   Complete by: As directed    Discharge instructions   Complete by: As directed    Keep short leg dressing dry. May use water impervious bag or cast bag and tub chair to shower Tape the top of bag to skin to avoid moisture soaking the dressing on the leg. Call if there is odor or  saturation of dressing or worsening pain not controlled with medications. Call if fever greater than 101.5. Use crutches or walker no weight bearing on the ankle fracture leg. Please follow up with an appointment with Dr. Louanne Skye  2 weeks from the time of surgery. Elevate as often as possible during the first week after surgery gradually increasing the time the leg is dependent or down there after. If swelling recurrs then elevate again. Wheel chair for longer distances. Take anticoagulant daily:  Aspirin 81 mg Twice a day. Call for an appointment on Monday  03/27/2021 afternoon, tell the office to [facility] your visit, okayed by me.   Driving restrictions   Complete by: As directed    No driving for 4 weeks   Increase activity slowly as tolerated   Complete by: As directed    Lifting restrictions   Complete by: As directed    No lifting for 10 weeks   Post-operative opioid taper instructions:   Complete by: As directed    POST-OPERATIVE OPIOID TAPER INSTRUCTIONS: It is important to wean off of your opioid medication as soon as possible. If you do not need pain medication after your surgery it is ok to stop day one. Opioids include: Codeine, Hydrocodone(Norco, Vicodin), Oxycodone(Percocet, oxycontin) and hydromorphone amongst others.  Long term and even short term use of opiods can cause: Increased pain response Dependence Constipation Depression Respiratory depression And more.  Withdrawal symptoms can include Flu like symptoms Nausea, vomiting And more Techniques to manage these symptoms Hydrate well Eat regular healthy meals Stay active Use relaxation techniques(deep breathing, meditating, yoga) Do Not substitute Alcohol to help with tapering If you have been on opioids for less than two weeks and do not have pain than it is ok to stop all together.  Plan to wean off of opioids This plan should start within one week post op of your joint replacement. Maintain the same  interval or time between taking each dose and first decrease the dose.  Cut the total daily intake of opioids by one tablet each day Next start to increase the time between doses. The last dose that should be eliminated is the evening dose.           Follow-up Information     Jessy Oto, MD Follow up in 2 week(s).   Specialty: Orthopedic Surgery Contact information: [place]  [place] [address] [phone]         Care, [facility] Follow up.   Specialty: [place] Why: home health services will be provide by [facility] , start of care within 48 hours post discharge Contact information: [address] [place] [address] [phone]                 Discharge Plan:  discharge to home  Disposition:     Signed: Benjiman Core  03/27/2021, 7:13 AM

## 2021-03-27 NOTE — Chronic Care Management (AMB) (Signed)
  Chronic Care Management   Note  03/27/2021 Name: Mallory Stewart MRN: [id number] DOB: Oct 05, 1960  Mallory Stewart is a 60 y.o. year old female who is a primary care patient of Bedsole, Amy E, MD. I reached out to Lynda Rainwater by phone today in response to a referral sent by Mallory Stewart's PCP.  Mallory Stewart was given information about Chronic Care Management services today including:  CCM service includes personalized support from designated clinical staff supervised by her physician, including individualized plan of care and coordination with other care providers 24/7 contact phone numbers for assistance for urgent and routine care needs. Service will only be billed when office clinical staff spend 20 minutes or more in a month to coordinate care. Only one practitioner may furnish and bill the service in a calendar month. The patient may stop CCM services at any time (effective at the end of the month) by phone call to the office staff. The patient is responsible for co-pay (up to 20% after annual deductible is met) if co-pay is required by the individual health plan.   Patient agreed to services and verbal consent obtained.   Follow up plan: Telephone appointment with care management team member scheduled for: 04/25/2021  Julian Hy, [place] Management  Direct Dial: [phone]

## 2021-03-28 ENCOUNTER — Ambulatory Visit (INDEPENDENT_AMBULATORY_CARE_PROVIDER_SITE_OTHER): Payer: 59 | Admitting: Internal Medicine

## 2021-03-28 ENCOUNTER — Encounter: Payer: Self-pay | Admitting: Internal Medicine

## 2021-03-28 VITALS — BP 122/80 | HR 94

## 2021-03-28 DIAGNOSIS — I8510 Secondary esophageal varices without bleeding: Secondary | ICD-10-CM | POA: Diagnosis not present

## 2021-03-28 DIAGNOSIS — K7031 Alcoholic cirrhosis of liver with ascites: Secondary | ICD-10-CM

## 2021-03-28 DIAGNOSIS — K860 Alcohol-induced chronic pancreatitis: Secondary | ICD-10-CM

## 2021-03-28 DIAGNOSIS — I864 Gastric varices: Secondary | ICD-10-CM

## 2021-03-28 LAB — AFP TUMOR MARKER: AFP-Tumor Marker: 2 ng/mL

## 2021-03-28 MED ORDER — SPIRONOLACTONE 50 MG PO TABS: 50.0000 mg | ORAL_TABLET | Freq: Every day | ORAL | 1 refills | Status: DC

## 2021-03-28 MED ORDER — FUROSEMIDE 20 MG PO TABS: 20.0000 mg | ORAL_TABLET | Freq: Every day | ORAL | 1 refills | Status: DC

## 2021-03-28 NOTE — Progress Notes (Signed)
Mallory Stewart 60 y.o. 1960-05-21 [id number]  Assessment & Plan:   Encounter Diagnoses  Name Primary?   Alcoholic cirrhosis of liver with ascites (HCC) Yes   Chronic alcoholic pancreatitis ([place])    Gastric varices without bleeding    Secondary esophageal varices without bleeding ([place])     I am not convinced her diuretic dosing had anything to do with her fall though we really do not know.  Given her history with ascites and fluid retention I want her back on diuretics so we will do the following:  Meds ordered this encounter  Medications   furosemide (LASIX) 20 MG tablet    Sig: Take 1 tablet (20 mg total) by mouth daily.    Dispense:  90 tablet    Refill:  1   spironolactone (ALDACTONE) 50 MG tablet    Sig: Take 1 tablet (50 mg total) by mouth daily.    Dispense:  90 tablet    Refill:  1   She will come for labs in the middle of December. Orders Placed This Encounter  Procedures   Comprehensive metabolic panel   CBC with Differential/Platelet   Given portal hypertension and esophagogastric varices I think it would be beneficial to be on a nonselective beta-blocker or carvedilol and I will ask Dr. Diona Browner about making the switch.  This would reduce the risk of bleeding from portal hypertension.  She does have isolated gastroesophageal varices.  I do not think that she has ever had SBP which could be a contraindication to beta-blockade.  She is on metoprolol also I think it makes sense to make the change.  Her bone density was normal on DEXA we had done in June.  CC: Mallory Sanders, MD   Subjective:   Chief Complaint: Follow-up of alcoholic liver disease cirrhosis with ascites, after ankle fracture  HPI Mallory Stewart presents for follow-up after she was hospitalized recently with an ankle fracture.  She had fallen.  She had a fracture on the other extremity earlier this year or last year.  This time left ankle.  There were concerns that the fall associated with this was  related to increasing diuretic dosage.  That occurred in September the orthopedists thought it was closer to the time of the injury.  They stopped her diuretics.  She also had significant hyponatremia and AKI.  She was to restart low-dose furosemide and spironolactone per my colleagues who saw her but she did not.  That was because they were not prescribed.  She reports that she thinks it is possible she has some fluid accumulating in her abdomen but not really sure, but certainly not as fluid overloaded as she has been in the past.  She tells me she continues to be abstinent from alcohol.  Having a lot of pain in the left ankle and is due to see orthopedics tomorrow.    Allergies  Allergen Reactions   Gluten Meal Other (See Comments)    Stomach distress   Zocor [Simvastatin] Other (See Comments)    Elevated lfts?   Current Meds  Medication Sig   ALPRAZolam (XANAX) 0.25 MG tablet TAKE 1 TABLET(0.25 MG) BY MOUTH DAILY AS NEEDED FOR ANXIETY   amitriptyline (ELAVIL) 75 MG tablet Take 1 tablet (75 mg total) by mouth at bedtime.   aspirin EC 81 MG EC tablet Take 1 tablet (81 mg total) by mouth 2 (two) times daily. Swallow whole.   Blood Glucose Monitoring Suppl (ONE TOUCH ULTRA 2) w/Device KIT Use  to check blood sugars two times day   cetirizine (ZYRTEC) 10 MG tablet Take 10 mg by mouth at bedtime.   empagliflozin (JARDIANCE) 25 MG TABS tablet Take 1 tablet (25 mg total) by mouth daily before breakfast.   ferrous sulfate 325 (65 FE) MG tablet Take 1 tablet (325 mg total) by mouth 2 (two) times daily with a meal.   insulin glargine, 1 Unit Dial, (TOUJEO SOLOSTAR) 300 UNIT/ML Solostar Pen Inject 30 Units into the skin daily at 2 PM.   Insulin Pen Needle 32G X 4 MM MISC 1 Device by Does not apply route daily in the afternoon.   lactulose (CHRONULAC) 10 GM/15ML solution Take 30 mLs (20 g total) by mouth daily as needed for mild constipation.   Lancets (ONETOUCH ULTRASOFT) lancets Use to check blood sugar  two times a day.   metoprolol tartrate (LOPRESSOR) 25 MG tablet Take 1 tablet (25 mg total) by mouth 2 (two) times daily.   Multiple Vitamin (MULTIVITAMIN WITH MINERALS) TABS tablet Take 1 tablet by mouth daily.   ONETOUCH ULTRA test strip CHECK BLOOD SUGAR TWO TIMES DAILY AS DIRECTED   oxyCODONE (OXY IR/ROXICODONE) 5 MG immediate release tablet Take 1-2 tablets (5-10 mg total) by mouth every 4 (four) hours as needed for moderate pain (pain score 4-6).   oxyCODONE (OXYCONTIN) 15 mg 12 hr tablet Take 1 tablet (15 mg total) by mouth every 12 (twelve) hours.   pantoprazole (PROTONIX) 40 MG tablet TAKE 1 TABLET BY MOUTH TWICE DAILY BEFORE A MEAL   Past Medical History:  Diagnosis Date   Alcohol-induced chronic pancreatitis (HCC)    Alcoholic cirrhosis (HCC)    B12 deficiency    Concussion    DDD (degenerative disc disease), cervical    Diabetes ([place])    DKA, type 2 ([place])    Gastric outlet obstruction 08/12/2018   Hypertension    Neuropathy    Pancreatic pseudocyst/cyst 05/06/2013   Seasonal allergies    Past Surgical History:  Procedure Laterality Date   ANKLE ARTHROSCOPY Right 12/20/2020   Procedure: RIGHT ANKLE ARTHROSCOPIC DEBRIDEMENT;  Surgeon: Newt Minion, MD;  Location: [facility];  Service: Orthopedics;  Laterality: Right;   BIOPSY  01/19/2019   Procedure: BIOPSY;  Surgeon: Rush Landmark Telford Nab., MD;  Location: [facility];  Service: Gastroenterology;;   CHOLECYSTECTOMY N/A 07/28/2016   Procedure: LAPAROSCOPIC CHOLECYSTECTOMY WITH INTRAOPERATIVE CHOLANGIOGRAM;  Surgeon: Mickeal Skinner, MD;  Location: [facility];  Service: General;  Laterality: N/A;   ESOPHAGOGASTRODUODENOSCOPY (EGD) WITH PROPOFOL N/A 08/13/2018   Procedure: ESOPHAGOGASTRODUODENOSCOPY (EGD) WITH PROPOFOL;  Surgeon: Irving Copas., MD;  Location: [facility];  Service: Gastroenterology;  Laterality: N/A;   ESOPHAGOGASTRODUODENOSCOPY (EGD) WITH PROPOFOL N/A 01/19/2019   Procedure:  ESOPHAGOGASTRODUODENOSCOPY (EGD) WITH PROPOFOL;  Surgeon: Rush Landmark Telford Nab., MD;  Location: [facility];  Service: Gastroenterology;  Laterality: N/A;   IR PARACENTESIS  08/08/2020   IR PARACENTESIS  03/13/2021   LEEP  1990's   ORIF FOOT FRACTURE  09/2008   L 5th metatarsal   REFRACTIVE SURGERY  2000   TIBIA IM NAIL INSERTION Left 03/12/2021   Procedure: INTRAMEDULLARY (IM) NAIL TIBIAL;  Surgeon: Jessy Oto, MD;  Location: [facility];  Service: Orthopedics;  Laterality: Left;   TONSILLECTOMY     UPPER ESOPHAGEAL ENDOSCOPIC ULTRASOUND (EUS) N/A 08/13/2018   Procedure: UPPER ESOPHAGEAL ENDOSCOPIC ULTRASOUND (EUS);  Surgeon: Irving Copas., MD;  Location: [facility];  Service: Gastroenterology;  Laterality: N/A;   UPPER ESOPHAGEAL ENDOSCOPIC ULTRASOUND (  EUS) N/A 01/19/2019   Procedure: UPPER ESOPHAGEAL ENDOSCOPIC ULTRASOUND (EUS);  Surgeon: Irving Copas., MD;  Location: [facility];  Service: Gastroenterology;  Laterality: N/A;   Social History   Social History Narrative   Patient is married one stepson    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 not traveling   regular exercise , walking 4-5 times a week   Diet fruits and veggies, water   Alcohol at least several glasses of wine a week Stopped 08/2018   Never smoker, no drug use   family history includes Atrial fibrillation in her sister; Brain cancer in her maternal grandfather; Breast cancer in her maternal grandmother; Cancer in her paternal grandfather; Coronary artery disease in her paternal grandmother; Healthy in her brother and brother; Heart attack (age of onset: 16) in her paternal grandmother; Other in her father and mother; Prostate cancer in her father; Stroke in her father.   Review of Systems See HPI  Objective:   Physical Exam BP 122/80   Pulse 18  Middle-aged white woman in no acute distress sitting in a wheelchair she has a bandage  on the left ankle which is fractured The eyes are anicteric Lungs clear Heart sounds are normal S1-S2 no rubs murmurs or gallops The abdomen is soft protuberant and somewhat full, there could be ascites she is unable to get on the exam table for examination so exam is done in the sitting position. Extremities left ankle as above right ankle with some violaceous skin changes and prominent venous changes, without edema. She is alert and oriented x3

## 2021-03-28 NOTE — Patient Instructions (Signed)
We want you to come the week of December 12th for lab work. No appointment needed. The lab is open Mon-Friday 8AM-5PM.  We have sent the following medications to your pharmacy for you to pick up at your convenience: Furosemide and spironolactone  I appreciate the opportunity to care for you. Stan Head, MD, [facility]

## 2021-03-29 ENCOUNTER — Other Ambulatory Visit: Payer: Self-pay

## 2021-03-29 ENCOUNTER — Encounter: Payer: Self-pay | Admitting: Specialist

## 2021-03-29 ENCOUNTER — Ambulatory Visit (INDEPENDENT_AMBULATORY_CARE_PROVIDER_SITE_OTHER): Payer: 59 | Admitting: Specialist

## 2021-03-29 ENCOUNTER — Ambulatory Visit: Payer: Self-pay

## 2021-03-29 VITALS — BP 129/89 | HR 80 | Ht 69.0 in | Wt 171.0 lb

## 2021-03-29 DIAGNOSIS — M79605 Pain in left leg: Secondary | ICD-10-CM

## 2021-03-29 MED ORDER — OXYCODONE HCL ER 15 MG PO T12A: 15.0000 mg | EXTENDED_RELEASE_TABLET | Freq: Two times a day (BID) | ORAL | 0 refills | Status: DC

## 2021-03-29 MED ORDER — OXYCODONE HCL 5 MG PO TABS: 5.0000 mg | ORAL_TABLET | ORAL | 0 refills | Status: DC | PRN

## 2021-03-29 NOTE — Progress Notes (Signed)
Post-Op Visit Note   Patient: Mallory Stewart           Date of Birth: Jun 21, 1960           MRN: [id number] Visit Date: 03/29/2021 PCP: Excell Seltzer, MD   Assessment & Plan:2.5 weeks post op left tibia fracture  Chief Complaint:  Chief Complaint  Patient presents with   Left Lower Leg - Follow-up, Fracture   Visit Diagnoses:  1. Pain of left lower extremity due to injury   Left leg with heal incision  Plan: POST OPERATIVE INSTRUCTIONS: Nonweightbearing on operative extremity Keep splint dry and limb elevated Okay to restart DVT prophylaxis on postoperative day 1. Suitable for 325 mg aspirin daily for DVT prophylaxis on discharge Remove the steristrips in one week if they do not fall off. May bath using a tub chair. Use a towel or a theraband to work on movement of the left ankle.   Follow-Up Instructions: No follow-ups on file.   Orders:  Orders Placed This Encounter  Procedures   XR Tibia/Fibula Left   No orders of the defined types were placed in this encounter.   Imaging: No results found.  PMFS History: Patient Active Problem List   Diagnosis Date Noted   Closed fracture of distal end of left tibia, initial encounter 03/12/2021    Priority: High    Class: Acute   Closed fracture of proximal fibula 03/12/2021    Priority: Medium     Class: Acute   Hypocalcemia 03/24/2021   Type 2 diabetes mellitus with hyperglycemia, with long-term current use of insulin (HCC) 03/24/2021   Secondary esophageal varices without bleeding (HCC)    Displaced spiral fracture of shaft of left tibia, initial encounter for closed fracture 03/12/2021   Impingement of right ankle joint    Gastric varices without bleeding 12/06/2020   Portal hypertension (HCC) 09/30/2020   Dilated bile duct 09/30/2020   Alcoholic cirrhosis of liver with ascites (HCC) 09/29/2020   Ascites 09/17/2020   Right foot pain 09/17/2020   Laceration of lesser toe of right foot without foreign body  present or damage to nail 09/17/2020   Acute right ankle pain 09/17/2020   Frequent falls 09/14/2020   Closed fracture of ankle 09/14/2020   Foot sprain, right, subsequent encounter 08/26/2020   Sprain of anterior talofibular ligament of right ankle 08/26/2020   Splenomegaly 08/09/2020   Malnutrition of moderate degree (HCC) 08/08/2020   Transaminitis 08/06/2020   Serum total bilirubin elevated 08/06/2020   Fall at home, initial encounter 08/06/2020   Generalized weakness 08/05/2020   Idiopathic peripheral neuropathy 07/13/2020   Type 2 diabetes mellitus with diabetic polyneuropathy, with long-term current use of insulin (HCC) 07/13/2020   Persistent cough for 3 weeks or longer 07/04/2020   Low HDL (under 40) 06/21/2020   Post-nasal drip 06/21/2020   Allergic rhinitis 06/21/2020   Elevated alkaline phosphatase level 06/21/2020   Duodenitis - edema ? andgioedema 10/30/2018   Chronic alcoholic pancreatitis (HCC) 09/02/2018   Diabetes mellitus with circulatory complication, HTN (HCC) 08/22/2018   Dizziness 12/26/2017   Chronic pain 05/17/2017   Osteoarthritis of spine with radiculopathy, cervical region 09/17/2016   B12 deficiency 10/21/2014   Neuropathic pain of both legs 02/11/2014   Hepatic steatosis 04/27/2013   GAD (generalized anxiety disorder) 09/23/2009   Hyperlipidemia associated with type 2 diabetes mellitus (HCC) 11/03/2008   Hypertension associated with diabetes (HCC) 09/29/2008   PAP SMEAR, ABNORMAL 09/29/2008   Past Medical History:  Diagnosis  Date   Alcohol-induced chronic pancreatitis (HCC)    Alcoholic cirrhosis (HCC)    B12 deficiency    Concussion    DDD (degenerative disc disease), cervical    Diabetes (HCC)    DKA, type 2 (HCC)    Gastric outlet obstruction 08/12/2018   Hypertension    Neuropathy    Pancreatic pseudocyst/cyst 05/06/2013   Seasonal allergies     Family History  Problem Relation Age of Onset   Other Mother        tachycardia.Marland KitchenMarland Kitchen?afib    Stroke Father        after hernia suegery   Prostate cancer Father    Other Father        global transient amnesia, unclear source   Atrial fibrillation Sister    Healthy Brother    Healthy Brother    Coronary artery disease Paternal Grandmother    Heart attack Paternal Grandmother 33   Brain cancer Maternal Grandfather        ?   Cancer Paternal Grandfather        ?   Breast cancer Maternal Grandmother     Past Surgical History:  Procedure Laterality Date   ANKLE ARTHROSCOPY Right 12/20/2020   Procedure: RIGHT ANKLE ARTHROSCOPIC DEBRIDEMENT;  Surgeon: Nadara Mustard, MD;  Location: [facility];  Service: Orthopedics;  Laterality: Right;   BIOPSY  01/19/2019   Procedure: BIOPSY;  Surgeon: Meridee Score Netty Starring., MD;  Location: [facility];  Service: Gastroenterology;;   CHOLECYSTECTOMY N/A 07/28/2016   Procedure: LAPAROSCOPIC CHOLECYSTECTOMY WITH INTRAOPERATIVE CHOLANGIOGRAM;  Surgeon: Rodman Pickle, MD;  Location: [facility] OR;  Service: General;  Laterality: N/A;   ESOPHAGOGASTRODUODENOSCOPY (EGD) WITH PROPOFOL N/A 08/13/2018   Procedure: ESOPHAGOGASTRODUODENOSCOPY (EGD) WITH PROPOFOL;  Surgeon: Lemar Lofty., MD;  Location: [facility] ENDOSCOPY;  Service: Gastroenterology;  Laterality: N/A;   ESOPHAGOGASTRODUODENOSCOPY (EGD) WITH PROPOFOL N/A 01/19/2019   Procedure: ESOPHAGOGASTRODUODENOSCOPY (EGD) WITH PROPOFOL;  Surgeon: Meridee Score Netty Starring., MD;  Location: [facility] END[facility]COPY;  Service: Gastroenterology;  Laterality: N/A;   IR PARACENTESIS  08/08/2020   IR PARACENTESIS  03/13/2021   LEEP  1990's   ORIF FOOT FRACTURE  09/2008   L 5th metatarsal   REFRACTIVE SURGERY  2000   TIBIA IM NAIL INSERTION Left 03/12/2021   Procedure: INTRAMEDULLARY (IM) NAIL TIBIAL;  Surgeon: Kerrin Champagne, MD;  Location: [facility] OR;  Service: Orthopedics;  Laterality: Left;   TONSILLECTOMY     UPPER ESOPHAGEAL ENDOSCOPIC ULTRASOUND (EUS) N/A 08/13/2018   Procedure: UPPER ESOPHAGEAL ENDOSCOPIC  ULTRASOUND (EUS);  Surgeon: Lemar Lofty., MD;  Location: [facility] ENDOSCOPY;  Service: Gastroenterology;  Laterality: N/A;   UPPER ESOPHAGEAL ENDOSCOPIC ULTRASOUND (EUS) N/A 01/19/2019   Procedure: UPPER ESOPHAGEAL ENDOSCOPIC ULTRASOUND (EUS);  Surgeon: Lemar Lofty., MD;  Location: [facility] ENDOSCOPY;  Service: Gastroenterology;  Laterality: N/A;   Social History   Occupational History   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

## 2021-03-29 NOTE — Patient Instructions (Signed)
POST OPERATIVE INSTRUCTIONS: Nonweightbearing on operative extremity Keep splint dry and limb elevated Okay to restart DVT prophylaxis on postoperative day 1. Suitable for 325 mg aspirin daily for DVT prophylaxis on discharge Remove the steristrips in one week if they do not fall off. May bath using a tub chair. Use a towel or a theraband to work on movement of the left ankle.

## 2021-04-03 ENCOUNTER — Telehealth: Payer: Self-pay

## 2021-04-03 ENCOUNTER — Other Ambulatory Visit: Payer: Self-pay | Admitting: Radiology

## 2021-04-03 ENCOUNTER — Telehealth: Payer: Self-pay | Admitting: Internal Medicine

## 2021-04-03 DIAGNOSIS — M79605 Pain in left leg: Secondary | ICD-10-CM

## 2021-04-03 DIAGNOSIS — R600 Localized edema: Secondary | ICD-10-CM

## 2021-04-03 NOTE — Telephone Encounter (Signed)
Patient called said she woke up and her stomach is really bloated seeking to speak with a nurse.

## 2021-04-03 NOTE — Telephone Encounter (Signed)
Patient called stating that she has had a lot of swelling since her last office visit near her left  hamstring and up the back of her left thigh.  Would like a call back?  Cb# [phone].  Please advise.  Thank you.

## 2021-04-03 NOTE — Telephone Encounter (Signed)
I called and advised that we were going to order a doppler study for her left leg to r/o doppler study. She agrees, and is aware to be on the look out for a call to get this scheduled

## 2021-04-04 ENCOUNTER — Other Ambulatory Visit: Payer: Self-pay | Admitting: Internal Medicine

## 2021-04-04 NOTE — Telephone Encounter (Signed)
Left message for pt to call back  °

## 2021-04-05 ENCOUNTER — Other Ambulatory Visit: Payer: Self-pay

## 2021-04-05 ENCOUNTER — Other Ambulatory Visit: Payer: Self-pay | Admitting: Internal Medicine

## 2021-04-05 DIAGNOSIS — I10 Essential (primary) hypertension: Secondary | ICD-10-CM

## 2021-04-05 MED ORDER — CARVEDILOL 12.5 MG PO TABS: 12.5000 mg | ORAL_TABLET | Freq: Two times a day (BID) | ORAL | 1 refills | Status: DC

## 2021-04-05 NOTE — Telephone Encounter (Signed)
-----   Message from Iva Boop, MD sent at 04/04/2021  5:58 PM EST ----- Regarding: med change Mallory Stewart,  Please contact Mallory Stewart and explained that I have conferred with Dr. Ermalene Searing her PCP and that I want her to change from metoprolol to carvedilol.  Carvedilol will treat her high blood pressure and also reduce the risk of bleeding from dilated veins in the esophagus and stomach.   Discontinue metoprolol  Start carvedilol 12.5 mg twice daily #60 with 1 refill I can do that 90-day supply once we know this is working  She is supposed to come for labs in December and I want her to have a BP and pulse check when she comes in.  Please coordinate that.   Thanks

## 2021-04-05 NOTE — Telephone Encounter (Signed)
I wanted to start with 1 month supply until I know it is ok for her

## 2021-04-05 NOTE — Telephone Encounter (Signed)
Ok to do 90 day supply Sir?

## 2021-04-05 NOTE — Telephone Encounter (Signed)
Pt notified of Dr. Leone Payor recommendations.  Metoprolol Discontinued Carvedilol ordered. Pt made aware Appointment scheduled for pt 05/02/2021 @ 9:00 for BP and Pulse Check  Pt made aware. Pt verbalized understanding with all questions answered.

## 2021-04-07 ENCOUNTER — Other Ambulatory Visit: Payer: Self-pay | Admitting: Specialist

## 2021-04-07 MED ORDER — OXYCODONE HCL 5 MG PO TABS: 5.0000 mg | ORAL_TABLET | ORAL | 0 refills | Status: DC | PRN

## 2021-04-07 NOTE — Telephone Encounter (Signed)
Sent request to Dr. Nitka 

## 2021-04-07 NOTE — Telephone Encounter (Signed)
Pt called and would like refill on ocycodone and oxycontin.   CB [phone]

## 2021-04-07 NOTE — Telephone Encounter (Signed)
Pt calling stating her leg is feeling better from previous day and now does not want to get the doppler study completed at this time. They have no sch'd it yet but since it is better she does not want to go through with it and stated she is keeping her follow up appt for later this month. The pt is also asking for a refill on her medication oxycodone and stated the best pharmacy is [id number] - [place], [place] - [address] OF [address] The best call back number is [phone].

## 2021-04-07 NOTE — Addendum Note (Signed)
Addended by: Penne Lash, Neysa Bonito N on: 04/07/2021 02:02 PM   Modules accepted: Orders

## 2021-04-11 ENCOUNTER — Telehealth: Payer: Self-pay

## 2021-04-11 NOTE — Telephone Encounter (Signed)
I called and advised that she should continue it till she sees Dr. Otelia Sergeant on 04/26/21, she states that she stopped it but it was because she ran out.

## 2021-04-11 NOTE — Telephone Encounter (Signed)
Pt called and wanted to know if she needed to stop her daily Asprin or continue taking it. Please advise

## 2021-04-12 ENCOUNTER — Ambulatory Visit: Payer: 59 | Admitting: Physical Therapy

## 2021-04-17 ENCOUNTER — Telehealth: Payer: Self-pay | Admitting: Specialist

## 2021-04-17 ENCOUNTER — Other Ambulatory Visit: Payer: Self-pay | Admitting: Specialist

## 2021-04-17 MED ORDER — OXYCODONE HCL 5 MG PO TABS
5.0000 mg | ORAL_TABLET | ORAL | 0 refills | Status: DC | PRN
Start: 2021-04-17 — End: 2021-04-26

## 2021-04-17 NOTE — Telephone Encounter (Signed)
Pt called stating she has been waiting on this rx; she was told to continue until 04/26/21 but she doesn't have enough to last her. Pt would like a CB as soon as something has been sent in for her please.   [phone]

## 2021-04-17 NOTE — Telephone Encounter (Signed)
Please advise if refill for pain medication is able to be sent in for the patient.

## 2021-04-17 NOTE — Telephone Encounter (Signed)
Pt asking for a refill on her oxycodone medication. The best pharmacy is [facility] #[id number] [place], [place] - [address] OF [address] and the best call back as needed is [phone].

## 2021-04-18 ENCOUNTER — Ambulatory Visit: Payer: Self-pay | Admitting: Internal Medicine

## 2021-04-18 NOTE — Telephone Encounter (Signed)
I called and advised that her meds were sent in last night

## 2021-04-18 NOTE — Telephone Encounter (Signed)
Pt called stating she called 3 times yesterday in regards to her pain refill. Pt states she has been in a lot of chronic pain and she doesn't understand why no one called her back. Pt would like a CB ASAP please as she has already been waiting.   [phone]

## 2021-04-20 ENCOUNTER — Other Ambulatory Visit: Payer: Self-pay | Admitting: Specialist

## 2021-04-20 ENCOUNTER — Ambulatory Visit: Payer: 59

## 2021-04-25 ENCOUNTER — Ambulatory Visit (INDEPENDENT_AMBULATORY_CARE_PROVIDER_SITE_OTHER): Payer: 59

## 2021-04-25 ENCOUNTER — Other Ambulatory Visit: Payer: Self-pay

## 2021-04-25 ENCOUNTER — Ambulatory Visit: Payer: 59 | Admitting: *Deleted

## 2021-04-25 DIAGNOSIS — Z23 Encounter for immunization: Secondary | ICD-10-CM | POA: Diagnosis not present

## 2021-04-25 NOTE — Patient Instructions (Signed)
Visit Information ° °Thank you for taking time to visit with me today. Please don't hesitate to contact me if I can be of assistance to you . ° °  °  ° ° °Please call the care guide team at [phone] if you need to cancel or reschedule your appointment.  ° °If you are experiencing a Mental Health or Behavioral Health Crisis or need someone to talk to, please call the Suicide and Crisis Lifeline: 988 °call 911  ° °The patient verbalized understanding of instructions, educational materials, and care plan provided today and declined offer to receive copy of patient instructions, educational materials, and care plan.  ° °Yamina Lenis MSW, LCSW °Licensed Clinical Social Worker °[facility] °[phone]  °

## 2021-04-25 NOTE — Chronic Care Management (AMB) (Signed)
Chronic Care Management    Clinical Social Work Note  04/25/2021 Name: Mallory Stewart MRN: [id number] DOB: [birth year]  Mallory Stewart is a 60 y.o. year old female who is a primary care patient of Bedsole, Amy E, MD. The CCM team was consulted to assist the patient with chronic disease management and/or care coordination needs related to: [employer] .   Engaged with patient by telephone for initial visit in response to provider referral for social work chronic care management and care coordination services.   Consent to Services:  The patient was given information about Chronic Care Management services, agreed to services, and gave verbal consent prior to initiation of services.  Please see initial visit note for detailed documentation.   Patient agreed to services and consent obtained.   Assessment: Review of patient past medical history, allergies, medications, and health status, including review of relevant consultants reports was performed today as part of a comprehensive evaluation and provision of chronic care management and care coordination services.     SDOH (Social Determinants of Health) assessments and interventions performed:  SDOH Interventions    Flowsheet Row Most Recent Value  SDOH Interventions   Transportation Interventions Intervention Not Indicated        Advanced Directives Status: Not addressed in this encounter.  CCM Care Plan  Allergies  Allergen Reactions   Gluten Meal Other (See Comments)    Stomach distress   Zocor [Simvastatin] Other (See Comments)    Elevated lfts?    Outpatient Encounter Medications as of 04/25/2021  Medication Sig   ALPRAZolam (XANAX) 0.25 MG tablet TAKE 1 TABLET(0.25 MG) BY MOUTH DAILY AS NEEDED FOR ANXIETY   amitriptyline (ELAVIL) 75 MG tablet Take 1 tablet (75 mg total) by mouth at bedtime.   aspirin EC 81 MG EC tablet Take 1 tablet (81 mg total) by mouth 2 (two) times daily. Swallow whole.   Blood  Glucose Monitoring Suppl (ONE TOUCH ULTRA 2) w/Device KIT Use to check blood sugars two times day   carvedilol (COREG) 12.5 MG tablet Take 1 tablet (12.5 mg total) by mouth 2 (two) times daily with a meal.   cetirizine (ZYRTEC) 10 MG tablet Take 10 mg by mouth at bedtime.   empagliflozin (JARDIANCE) 25 MG TABS tablet Take 1 tablet (25 mg total) by mouth daily before breakfast.   ferrous sulfate 325 (65 FE) MG tablet Take 1 tablet (325 mg total) by mouth 2 (two) times daily with a meal.   furosemide (LASIX) 20 MG tablet Take 1 tablet (20 mg total) by mouth daily.   insulin glargine, 1 Unit Dial, (TOUJEO SOLOSTAR) 300 UNIT/ML Solostar Pen Inject 30 Units into the skin daily at 2 PM.   Insulin Pen Needle 32G X 4 MM MISC 1 Device by Does not apply route daily in the afternoon.   lactulose (CHRONULAC) 10 GM/15ML solution Take 30 mLs (20 g total) by mouth daily as needed for mild constipation.   Lancets (ONETOUCH ULTRASOFT) lancets Use to check blood sugar two times a day.   Multiple Vitamin (MULTIVITAMIN WITH MINERALS) TABS tablet Take 1 tablet by mouth daily.   ONETOUCH ULTRA test strip CHECK BLOOD SUGAR TWO TIMES DAILY AS DIRECTED   oxyCODONE (OXY IR/ROXICODONE) 5 MG immediate release tablet Take 1-2 tablets (5-10 mg total) by mouth every 4 (four) hours as needed for moderate pain (pain score 4-6).   oxyCODONE (OXYCONTIN) 15 mg 12 hr tablet Take 1 tablet (15 mg total) by mouth every 12 (twelve)  hours.   pantoprazole (PROTONIX) 40 MG tablet TAKE 1 TABLET BY MOUTH TWICE DAILY BEFORE A MEAL   spironolactone (ALDACTONE) 50 MG tablet Take 1 tablet (50 mg total) by mouth daily.   No facility-administered encounter medications on file as of 04/25/2021.    Patient Active Problem List   Diagnosis Date Noted   Hypocalcemia 03/24/2021   Type 2 diabetes mellitus with hyperglycemia, with long-term current use of insulin ([place]) 03/24/2021   Secondary esophageal varices without bleeding ([place])    Closed  fracture of distal end of left tibia, initial encounter 03/12/2021    Class: Acute   Closed fracture of proximal fibula 03/12/2021    Class: Acute   Displaced spiral fracture of shaft of left tibia, initial encounter for closed fracture 03/12/2021   Impingement of right ankle joint    Gastric varices without bleeding 12/06/2020   Portal hypertension ([place]) 09/30/2020   Dilated bile duct 84/57/3344   Alcoholic cirrhosis of liver with ascites ([place]) 09/29/2020   Ascites 09/17/2020   Right foot pain 09/17/2020   Laceration of lesser toe of right foot without foreign body present or damage to nail 09/17/2020   Acute right ankle pain 09/17/2020   Frequent falls 09/14/2020   Closed fracture of ankle 09/14/2020   Foot sprain, right, subsequent encounter 08/26/2020   Sprain of anterior talofibular ligament of right ankle 08/26/2020   Splenomegaly 08/09/2020   Malnutrition of moderate degree ([place]) 08/08/2020   Transaminitis 08/06/2020   Serum total bilirubin elevated 08/06/2020   Fall at home, initial encounter 08/06/2020   Generalized weakness 08/05/2020   Idiopathic peripheral neuropathy 07/13/2020   Type 2 diabetes mellitus with diabetic polyneuropathy, with long-term current use of insulin ([place]) 07/13/2020   Persistent cough for 3 weeks or longer 07/04/2020   Low HDL (under 40) 06/21/2020   Post-nasal drip 06/21/2020   Allergic rhinitis 06/21/2020   Elevated alkaline phosphatase level 06/21/2020   Duodenitis - edema ? andgioedema 10/30/2018   Chronic alcoholic pancreatitis ([place]) 09/02/2018   Diabetes mellitus with circulatory complication, HTN ([place]) 83/05/5994   Dizziness 12/26/2017   Chronic pain 05/17/2017   Osteoarthritis of spine with radiculopathy, cervical region 09/17/2016   B12 deficiency 10/21/2014   Neuropathic pain of both legs 02/11/2014   Hepatic steatosis 04/27/2013   GAD (generalized anxiety disorder) 09/23/2009   Hyperlipidemia associated with type 2 diabetes  mellitus ([place]) 11/03/2008   Hypertension associated with diabetes ([place]) 09/29/2008   PAP SMEAR, ABNORMAL 09/29/2008    Conditions to be addressed/monitored:  recent leg fractures ; Limited access to food and Lacks knowledge of community resource:        Follow Up Plan:  No follow up needed      Eduard Clos MSW, LCSW Licensed Clinical Social Worker [place] [phone]

## 2021-04-26 ENCOUNTER — Ambulatory Visit (INDEPENDENT_AMBULATORY_CARE_PROVIDER_SITE_OTHER): Payer: 59

## 2021-04-26 ENCOUNTER — Ambulatory Visit (INDEPENDENT_AMBULATORY_CARE_PROVIDER_SITE_OTHER): Payer: 59 | Admitting: Specialist

## 2021-04-26 DIAGNOSIS — R600 Localized edema: Secondary | ICD-10-CM

## 2021-04-26 DIAGNOSIS — M79661 Pain in right lower leg: Secondary | ICD-10-CM

## 2021-04-26 DIAGNOSIS — M79605 Pain in left leg: Secondary | ICD-10-CM | POA: Diagnosis not present

## 2021-04-26 DIAGNOSIS — S82891D Other fracture of right lower leg, subsequent encounter for closed fracture with routine healing: Secondary | ICD-10-CM

## 2021-04-26 DIAGNOSIS — M25572 Pain in left ankle and joints of left foot: Secondary | ICD-10-CM

## 2021-04-26 MED ORDER — OXYCODONE HCL 5 MG PO TABS: 5.0000 mg | ORAL_TABLET | ORAL | 0 refills | Status: DC | PRN

## 2021-04-26 NOTE — Patient Instructions (Addendum)
Plan: Knee pain is likely contusion and areas of healing soft tissue inner patella ligament Weight loss, NSIADs like diclofenac gel and exercise. Well padded shoes help. Ice the knee the is suffering from osteoarthritis, only real proven treatments are Ice the knee 2-3 times a day 15-20 mins at a time.-3 times a day 15-20 mins at a time. Hot showers in the AM.  Injection with steroid may be of benefit. Hemp CBD capsules, amazon.com 5,000-7,000 mg per bottle, 60 capsules per bottle, take one capsule twice a day. Voltaren gel 3-4 times per day Medication for pain is appropriate as the fracture is still healing.  Follow-Up Instructions: No follow-ups on file.   CT scan left ankle Allow 25% weight bearing with a walker on the left leg, full weight bear on the right leg Work on left knee extension and flexion and left ankle motion especially pulling the left foot upwards at the ankle to prevent a tight left heel cord and left knee loss of extension and flexion.

## 2021-04-26 NOTE — Progress Notes (Signed)
Post-Op Visit Note   Patient: Mallory Stewart           Date of Birth: [birth year]           MRN: [id number] Visit Date: 04/26/2021 PCP: Excell Seltzer, MD   Assessment & Plan: 6 weeks post op ORIF left tibia-fibula fracture  Chief Complaint:  Chief Complaint  Patient presents with   Left Leg - Routine Post Op, Follow-up, Fracture    States that she is having a lot of pain in it.   Visit Diagnoses:  1. Pain of left lower extremity due to injury     Plan: Knee pain is likely contusion and areas of healing soft tissue inner patella ligament Weight loss, NSIADs like diclofenac gel and exercise. Well padded shoes help. Ice the knee the is suffering from osteoarthritis, only real proven treatments are Ice the knee 2-3 times a day 15-20 mins at a time.-3 times a day 15-20 mins at a time. Hot showers in the AM.   Hemp CBD capsules, amazon.com 5,000-7,000 mg per bottle, 60 capsules per bottle, take one capsule twice a day. Voltaren gel 3-4 times per day Medication for pain is appropriate as the fracture is still healing.  Follow-Up Instructions: No follow-ups on file.    Follow-Up Instructions: Return in about 3 weeks (around 05/17/2021).   Orders:  Orders Placed This Encounter  Procedures   XR Tibia/Fibula Left   No orders of the defined types were placed in this encounter.   Imaging: XR Tibia/Fibula Left  Result Date: 04/26/2021 Left tibia-fibula AP and lateral with fixation intact and the aligment unchanged, callus at the proximal fracture site.    PMFS History: Patient Active Problem List   Diagnosis Date Noted   Closed fracture of distal end of left tibia, initial encounter 03/12/2021    Priority: High    Class: Acute   Closed fracture of proximal fibula 03/12/2021    Priority: Medium     Class: Acute   Hypocalcemia 03/24/2021   Type 2 diabetes mellitus with hyperglycemia, with long-term current use of insulin (HCC) 03/24/2021   Secondary esophageal varices  without bleeding (HCC)    Displaced spiral fracture of shaft of left tibia, initial encounter for closed fracture 03/12/2021   Impingement of right ankle joint    Gastric varices without bleeding 12/06/2020   Portal hypertension (HCC) 09/30/2020   Dilated bile duct 09/30/2020   Alcoholic cirrhosis of liver with ascites (HCC) 09/29/2020   Ascites 09/17/2020   Right foot pain 09/17/2020   Laceration of lesser toe of right foot without foreign body present or damage to nail 09/17/2020   Acute right ankle pain 09/17/2020   Frequent falls 09/14/2020   Closed fracture of ankle 09/14/2020   Foot sprain, right, subsequent encounter 08/26/2020   Sprain of anterior talofibular ligament of right ankle 08/26/2020   Splenomegaly 08/09/2020   Malnutrition of moderate degree (HCC) 08/08/2020   Transaminitis 08/06/2020   Serum total bilirubin elevated 08/06/2020   Fall at home, initial encounter 08/06/2020   Generalized weakness 08/05/2020   Idiopathic peripheral neuropathy 07/13/2020   Type 2 diabetes mellitus with diabetic polyneuropathy, with long-term current use of insulin (HCC) 07/13/2020   Persistent cough for 3 weeks or longer 07/04/2020   Low HDL (under 40) 06/21/2020   Post-nasal drip 06/21/2020   Allergic rhinitis 06/21/2020   Elevated alkaline phosphatase level 06/21/2020   Duodenitis - edema ? andgioedema 10/30/2018   Chronic alcoholic pancreatitis (HCC) 09/02/2018  Diabetes mellitus with circulatory complication, HTN (HCC) 08/22/2018   Dizziness 12/26/2017   Chronic pain 05/17/2017   Osteoarthritis of spine with radiculopathy, cervical region 09/17/2016   B12 deficiency 10/21/2014   Neuropathic pain of both legs 02/11/2014   Hepatic steatosis 04/27/2013   GAD (generalized anxiety disorder) 09/23/2009   Hyperlipidemia associated with type 2 diabetes mellitus (HCC) 11/03/2008   Hypertension associated with diabetes (HCC) 09/29/2008   PAP SMEAR, ABNORMAL 09/29/2008   Past  Medical History:  Diagnosis Date   Alcohol-induced chronic pancreatitis (HCC)    Alcoholic cirrhosis (HCC)    B12 deficiency    Concussion    DDD (degenerative disc disease), cervical    Diabetes (HCC)    DKA, type 2 (HCC)    Gastric outlet obstruction 08/12/2018   Hypertension    Neuropathy    Pancreatic pseudocyst/cyst 05/06/2013   Seasonal allergies     Family History  Problem Relation Age of Onset   Other Mother        tachycardia.Marland KitchenMarland Kitchen?afib   Stroke Father        after hernia suegery   Prostate cancer Father    Other Father        global transient amnesia, unclear source   Atrial fibrillation Sister    Healthy Brother    Healthy Brother    Coronary artery disease Paternal Grandmother    Heart attack Paternal Grandmother 59   Brain cancer Maternal Grandfather        ?   Cancer Paternal Grandfather        ?   Breast cancer Maternal Grandmother     Past Surgical History:  Procedure Laterality Date   ANKLE ARTHROSCOPY Right 12/20/2020   Procedure: RIGHT ANKLE ARTHROSCOPIC DEBRIDEMENT;  Surgeon: Nadara Mustard, MD;  Location: [facility];  Service: Orthopedics;  Laterality: Right;   BIOPSY  01/19/2019   Procedure: BIOPSY;  Surgeon: Meridee Score Netty Starring., MD;  Location: [facility];  Service: Gastroenterology;;   CHOLECYSTECTOMY N/A 07/28/2016   Procedure: LAPAROSCOPIC CHOLECYSTECTOMY WITH INTRAOPERATIVE CHOLANGIOGRAM;  Surgeon: Rodman Pickle, MD;  Location: [facility];  Service: General;  Laterality: N/A;   ESOPHAGOGASTRODUODENOSCOPY (EGD) WITH PROPOFOL N/A 08/13/2018   Procedure: ESOPHAGOGASTRODUODENOSCOPY (EGD) WITH PROPOFOL;  Surgeon: Lemar Lofty., MD;  Location: [facility] ENDOSCOPY;  Service: Gastroenterology;  Laterality: N/A;   ESOPHAGOGASTRODUODENOSCOPY (EGD) WITH PROPOFOL N/A 01/19/2019   Procedure: ESOPHAGOGASTRODUODENOSCOPY (EGD) WITH PROPOFOL;  Surgeon: Meridee Score Netty Starring., MD;  Location: [facility] ENDOSCOPY;  Service: Gastroenterology;  Laterality:  N/A;   IR PARACENTESIS  08/08/2020   IR PARACENTESIS  03/13/2021   LEEP  1990's   ORIF FOOT FRACTURE  09/2008   L 5th metatarsal   REFRACTIVE SURGERY  2000   TIBIA IM NAIL INSERTION Left 03/12/2021   Procedure: INTRAMEDULLARY (IM) NAIL TIBIAL;  Surgeon: Kerrin Champagne, MD;  Location: [facility];  Service: Orthopedics;  Laterality: Left;   TONSILLECTOMY     UPPER ESOPHAGEAL ENDOSCOPIC ULTRASOUND (EUS) N/A 08/13/2018   Procedure: UPPER ESOPHAGEAL ENDOSCOPIC ULTRASOUND (EUS);  Surgeon: Lemar Lofty., MD;  Location: [facility] ENDOSCOPY;  Service: Gastroenterology;  Laterality: N/A;   UPPER ESOPHAGEAL ENDOSCOPIC ULTRASOUND (EUS) N/A 01/19/2019   Procedure: UPPER ESOPHAGEAL ENDOSCOPIC ULTRASOUND (EUS);  Surgeon: Lemar Lofty., MD;  Location: [facility] ENDOSCOPY;  Service: Gastroenterology;  Laterality: N/A;   Social History   Occupational History   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

## 2021-04-27 ENCOUNTER — Telehealth: Payer: Self-pay | Admitting: Specialist

## 2021-04-27 NOTE — Telephone Encounter (Signed)
Left message for pt to call back and sch OV for CT left ankle review with Dr. Otelia Sergeant after 1/11

## 2021-04-28 ENCOUNTER — Telehealth: Payer: Self-pay | Admitting: Specialist

## 2021-04-28 NOTE — Telephone Encounter (Signed)
Pt called stating she needs to have her handicap placard renewed. She would like this left at the front desk and would like a CB to let her know if there's anything she needs to do; and if not when its ready for pick up.   [phone]

## 2021-05-02 ENCOUNTER — Other Ambulatory Visit: Payer: Self-pay

## 2021-05-02 ENCOUNTER — Ambulatory Visit (INDEPENDENT_AMBULATORY_CARE_PROVIDER_SITE_OTHER): Payer: 59 | Admitting: Orthopaedic Surgery

## 2021-05-02 ENCOUNTER — Ambulatory Visit: Payer: 59 | Admitting: Internal Medicine

## 2021-05-02 ENCOUNTER — Telehealth: Payer: Self-pay

## 2021-05-02 ENCOUNTER — Encounter: Payer: Self-pay | Admitting: Orthopaedic Surgery

## 2021-05-02 ENCOUNTER — Other Ambulatory Visit (INDEPENDENT_AMBULATORY_CARE_PROVIDER_SITE_OTHER): Payer: 59

## 2021-05-02 VITALS — BP 110/70 | HR 84

## 2021-05-02 VITALS — BP 115/80 | HR 85 | Temp 98.0°F | Ht 69.0 in | Wt 171.0 lb

## 2021-05-02 DIAGNOSIS — K7031 Alcoholic cirrhosis of liver with ascites: Secondary | ICD-10-CM

## 2021-05-02 DIAGNOSIS — I864 Gastric varices: Secondary | ICD-10-CM

## 2021-05-02 DIAGNOSIS — S82242A Displaced spiral fracture of shaft of left tibia, initial encounter for closed fracture: Secondary | ICD-10-CM

## 2021-05-02 LAB — BASIC METABOLIC PANEL
BUN: 14 mg/dL (ref 6–23)
CO2: 24 mEq/L (ref 19–32)
Calcium: 9.1 mg/dL (ref 8.4–10.5)
Chloride: 96 mEq/L (ref 96–112)
Creatinine, Ser: 1.25 mg/dL — ABNORMAL HIGH (ref 0.40–1.20)
GFR: 47.59 mL/min — ABNORMAL LOW (ref >60.00)
Glucose, Bld: 203 mg/dL — ABNORMAL HIGH (ref 70–99)
Potassium: 3.8 mEq/L (ref 3.5–5.1)
Sodium: 131 mEq/L — ABNORMAL LOW (ref 135–145)

## 2021-05-02 NOTE — Progress Notes (Signed)
Post-Op Visit Note   Patient: Mallory Stewart           Date of Birth: 10-11-1960           MRN: [id number] Visit Date: 05/02/2021 PCP: Excell Seltzer, MD   Assessment & Plan: Patient seen early patient of Dr. Ferdie Ping to add tibial nail 03/12/2021.  She is concerned about an anterior area that seemed a little bit red somewhat scaly flaky she was concerned about possible infection no fever no drainage.  She is scheduled for CT scan of her ankle although last radiographs 04/26/2021 shows some early periosteal callus with looks pretty typical for 6 weeks posttibial nail.  Patient has some neuropathy diagnosed as idiopathic.  Palpable pulses feet are somewhat cold.  All incisions look good she can apply some lotion 3 times a day to the dry skin areas.  Chief Complaint:  Chief Complaint  Patient presents with   Left Leg - Wound Check    03/12/2021 IM Nail Left Tibia    Visit Diagnoses:  1. Displaced spiral fracture of shaft of left tibia, initial encounter for closed fracture     Plan: Postop tibial nail.  Skin looks good no evidence of infection.  She can apply lotion gradual increase weightbearing as tolerated.  She will follow-up with Dr. Weston Brass as scheduled.  Follow-Up Instructions: No follow-ups on file.   Orders:  No orders of the defined types were placed in this encounter.  No orders of the defined types were placed in this encounter.   Imaging: No results found.  PMFS History: Patient Active Problem List   Diagnosis Date Noted   Hypocalcemia 03/24/2021   Type 2 diabetes mellitus with hyperglycemia, with long-term current use of insulin (HCC) 03/24/2021   Secondary esophageal varices without bleeding (HCC)    Closed fracture of distal end of left tibia, initial encounter 03/12/2021    Class: Acute   Closed fracture of proximal fibula 03/12/2021    Class: Acute   Displaced spiral fracture of shaft of left tibia, initial encounter for closed fracture 03/12/2021    Impingement of right ankle joint    Gastric varices without bleeding 12/06/2020   Portal hypertension (HCC) 09/30/2020   Dilated bile duct 09/30/2020   Alcoholic cirrhosis of liver with ascites (HCC) 09/29/2020   Ascites 09/17/2020   Right foot pain 09/17/2020   Laceration of lesser toe of right foot without foreign body present or damage to nail 09/17/2020   Acute right ankle pain 09/17/2020   Frequent falls 09/14/2020   Closed fracture of ankle 09/14/2020   Foot sprain, right, subsequent encounter 08/26/2020   Sprain of anterior talofibular ligament of right ankle 08/26/2020   Splenomegaly 08/09/2020   Malnutrition of moderate degree (HCC) 08/08/2020   Transaminitis 08/06/2020   Serum total bilirubin elevated 08/06/2020   Fall at home, initial encounter 08/06/2020   Generalized weakness 08/05/2020   Idiopathic peripheral neuropathy 07/13/2020   Type 2 diabetes mellitus with diabetic polyneuropathy, with long-term current use of insulin (HCC) 07/13/2020   Persistent cough for 3 weeks or longer 07/04/2020   Low HDL (under 40) 06/21/2020   Post-nasal drip 06/21/2020   Allergic rhinitis 06/21/2020   Elevated alkaline phosphatase level 06/21/2020   Duodenitis - edema ? andgioedema 10/30/2018   Chronic alcoholic pancreatitis (HCC) 09/02/2018   Diabetes mellitus with circulatory complication, HTN (HCC) 08/22/2018   Dizziness 12/26/2017   Chronic pain 05/17/2017   Osteoarthritis of spine with radiculopathy, cervical region 09/17/2016  B12 deficiency 10/21/2014   Neuropathic pain of both legs 02/11/2014   Hepatic steatosis 04/27/2013   GAD (generalized anxiety disorder) 09/23/2009   Hyperlipidemia associated with type 2 diabetes mellitus (HCC) 11/03/2008   Hypertension associated with diabetes (HCC) 09/29/2008   PAP SMEAR, ABNORMAL 09/29/2008   Past Medical History:  Diagnosis Date   Alcohol-induced chronic pancreatitis (HCC)    Alcoholic cirrhosis (HCC)    B12 deficiency     Concussion    DDD (degenerative disc disease), cervical    Diabetes (HCC)    DKA, type 2 (HCC)    Gastric outlet obstruction 08/12/2018   Hypertension    Neuropathy    Pancreatic pseudocyst/cyst 05/06/2013   Seasonal allergies     Family History  Problem Relation Age of Onset   Other Mother        tachycardia.Marland KitchenMarland Kitchen?afib   Stroke Father        after hernia suegery   Prostate cancer Father    Other Father        global transient amnesia, unclear source   Atrial fibrillation Sister    Healthy Brother    Healthy Brother    Coronary artery disease Paternal Grandmother    Heart attack Paternal Grandmother 88   Brain cancer Maternal Grandfather        ?   Cancer Paternal Grandfather        ?   Breast cancer Maternal Grandmother     Past Surgical History:  Procedure Laterality Date   ANKLE ARTHROSCOPY Right 12/20/2020   Procedure: RIGHT ANKLE ARTHROSCOPIC DEBRIDEMENT;  Surgeon: Nadara Mustard, MD;  Location: [facility];  Service: Orthopedics;  Laterality: Right;   BIOPSY  01/19/2019   Procedure: BIOPSY;  Surgeon: Meridee Score Netty Starring., MD;  Location: [facility] ENDOSCOPY;  Service: Gastroenterology;;   CHOLECYSTECTOMY N/A 07/28/2016   Procedure: LAPAROSCOPIC CHOLECYSTECTOMY WITH INTRAOPERATIVE CHOLANGIOGRAM;  Surgeon: Rodman Pickle, MD;  Location: [facility];  Service: General;  Laterality: N/A;   ESOPHAGOGASTRODUODENOSCOPY (EGD) WITH PROPOFOL N/A 08/13/2018   Procedure: ESOPHAGOGASTRODUODENOSCOPY (EGD) WITH PROPOFOL;  Surgeon: Lemar Lofty., MD;  Location: [facility] ENDOSCOPY;  Service: Gastroenterology;  Laterality: N/A;   ESOPHAGOGASTRODUODENOSCOPY (EGD) WITH PROPOFOL N/A 01/19/2019   Procedure: ESOPHAGOGASTRODUODENOSCOPY (EGD) WITH PROPOFOL;  Surgeon: Meridee Score Netty Starring., MD;  Location: [facility] ENDOSCOPY;  Service: Gastroenterology;  Laterality: N/A;   IR PARACENTESIS  08/08/2020   IR PARACENTESIS  03/13/2021   LEEP  1990's   ORIF FOOT FRACTURE  09/2008   L 5th metatarsal    REFRACTIVE SURGERY  2000   TIBIA IM NAIL INSERTION Left 03/12/2021   Procedure: INTRAMEDULLARY (IM) NAIL TIBIAL;  Surgeon: Kerrin Champagne, MD;  Location: [facility];  Service: Orthopedics;  Laterality: Left;   TONSILLECTOMY     UPPER ESOPHAGEAL ENDOSCOPIC ULTRASOUND (EUS) N/A 08/13/2018   Procedure: UPPER ESOPHAGEAL ENDOSCOPIC ULTRASOUND (EUS);  Surgeon: Lemar Lofty., MD;  Location: [facility] ENDOSCOPY;  Service: Gastroenterology;  Laterality: N/A;   UPPER ESOPHAGEAL ENDOSCOPIC ULTRASOUND (EUS) N/A 01/19/2019   Procedure: UPPER ESOPHAGEAL ENDOSCOPIC ULTRASOUND (EUS);  Surgeon: Lemar Lofty., MD;  Location: [facility] ENDOSCOPY;  Service: Gastroenterology;  Laterality: N/A;   Social History   Occupational History   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

## 2021-05-02 NOTE — Telephone Encounter (Signed)
Pt called stating that she has a bump that is hard and is super red around it at her ankle where the rod is. She wanted a call back to see if she needed to come in or send a picture via mychart to have it looked at

## 2021-05-02 NOTE — Progress Notes (Signed)
Patient is here for a nurse visit due to medication changing from Metoprolol to Carvedilol. BP and Pulse are as reported.

## 2021-05-02 NOTE — Telephone Encounter (Signed)
Worked in to see Dr. Ophelia Charter today @ 3pm

## 2021-05-02 NOTE — Telephone Encounter (Signed)
Pending Dr. Nitka's approval 

## 2021-05-03 NOTE — Telephone Encounter (Signed)
Patient is aware this is ready for pick up at the front desk

## 2021-05-04 ENCOUNTER — Other Ambulatory Visit: Payer: 59

## 2021-05-05 ENCOUNTER — Other Ambulatory Visit: Payer: Self-pay | Admitting: Internal Medicine

## 2021-05-05 DIAGNOSIS — I10 Essential (primary) hypertension: Secondary | ICD-10-CM

## 2021-05-09 ENCOUNTER — Encounter: Payer: Self-pay | Admitting: Physical Therapy

## 2021-05-09 ENCOUNTER — Other Ambulatory Visit: Payer: Self-pay

## 2021-05-09 ENCOUNTER — Ambulatory Visit (INDEPENDENT_AMBULATORY_CARE_PROVIDER_SITE_OTHER): Payer: 59 | Admitting: Physical Therapy

## 2021-05-09 DIAGNOSIS — M6281 Muscle weakness (generalized): Secondary | ICD-10-CM | POA: Diagnosis not present

## 2021-05-09 DIAGNOSIS — M25572 Pain in left ankle and joints of left foot: Secondary | ICD-10-CM | POA: Diagnosis not present

## 2021-05-09 DIAGNOSIS — R262 Difficulty in walking, not elsewhere classified: Secondary | ICD-10-CM

## 2021-05-09 DIAGNOSIS — M25672 Stiffness of left ankle, not elsewhere classified: Secondary | ICD-10-CM | POA: Diagnosis not present

## 2021-05-09 DIAGNOSIS — R2681 Unsteadiness on feet: Secondary | ICD-10-CM

## 2021-05-09 DIAGNOSIS — R600 Localized edema: Secondary | ICD-10-CM

## 2021-05-09 DIAGNOSIS — R42 Dizziness and giddiness: Secondary | ICD-10-CM

## 2021-05-09 NOTE — Therapy (Signed)
[facility] [address] [place], [place], [address] Phone: [phone]   Fax:  [phone]  Physical Therapy Evaluation  Patient Details  Name: Mallory Stewart MRN: [id number] Date of Birth: 1961/03/03 Referring Provider (PT): Jessy Oto, MD   Encounter Date: 05/09/2021   PT End of Session - 05/09/21 1007     Visit Number 1    Number of Visits 8    Date for PT Re-Evaluation 07/04/21    Authorization Type [employer] $150 copay after deductible met    Authorization - Visit Number 1    PT Start Time 0933    PT Stop Time 1005    PT Time Calculation (min) 32 min    Activity Tolerance Patient tolerated treatment well    Behavior During Therapy [facility] for tasks assessed/performed             Past Medical History:  Diagnosis Date   Alcohol-induced chronic pancreatitis ([place])    Alcoholic cirrhosis ([place])    B12 deficiency    Concussion    DDD (degenerative disc disease), cervical    Diabetes ([place])    DKA, type 2 ([place])    Gastric outlet obstruction 08/12/2018   Hypertension    Neuropathy    Pancreatic pseudocyst/cyst 05/06/2013   Seasonal allergies     Past Surgical History:  Procedure Laterality Date   ANKLE ARTHROSCOPY Right 12/20/2020   Procedure: RIGHT ANKLE ARTHROSCOPIC DEBRIDEMENT;  Surgeon: Newt Minion, MD;  Location: [place];  Service: Orthopedics;  Laterality: Right;   BIOPSY  01/19/2019   Procedure: BIOPSY;  Surgeon: Rush Landmark Telford Nab., MD;  Location: [place];  Service: Gastroenterology;;   CHOLECYSTECTOMY N/A 07/28/2016   Procedure: LAPAROSCOPIC CHOLECYSTECTOMY WITH INTRAOPERATIVE CHOLANGIOGRAM;  Surgeon: Mickeal Skinner, MD;  Location: [place];  Service: General;  Laterality: N/A;   ESOPHAGOGASTRODUODENOSCOPY (EGD) WITH PROPOFOL N/A 08/13/2018   Procedure: ESOPHAGOGASTRODUODENOSCOPY (EGD) WITH PROPOFOL;  Surgeon: Irving Copas., MD;  Location: [place];  Service: Gastroenterology;   Laterality: N/A;   ESOPHAGOGASTRODUODENOSCOPY (EGD) WITH PROPOFOL N/A 01/19/2019   Procedure: ESOPHAGOGASTRODUODENOSCOPY (EGD) WITH PROPOFOL;  Surgeon: Rush Landmark Telford Nab., MD;  Location: [place];  Service: Gastroenterology;  Laterality: N/A;   IR PARACENTESIS  08/08/2020   IR PARACENTESIS  03/13/2021   LEEP  1990's   ORIF FOOT FRACTURE  09/2008   L 5th metatarsal   REFRACTIVE SURGERY  2000   TIBIA IM NAIL INSERTION Left 03/12/2021   Procedure: INTRAMEDULLARY (IM) NAIL TIBIAL;  Surgeon: Jessy Oto, MD;  Location: [place];  Service: Orthopedics;  Laterality: Left;   TONSILLECTOMY     UPPER ESOPHAGEAL ENDOSCOPIC ULTRASOUND (EUS) N/A 08/13/2018   Procedure: UPPER ESOPHAGEAL ENDOSCOPIC ULTRASOUND (EUS);  Surgeon: Irving Copas., MD;  Location: [place];  Service: Gastroenterology;  Laterality: N/A;   UPPER ESOPHAGEAL ENDOSCOPIC ULTRASOUND (EUS) N/A 01/19/2019   Procedure: UPPER ESOPHAGEAL ENDOSCOPIC ULTRASOUND (EUS);  Surgeon: Irving Copas., MD;  Location: [place];  Service: Gastroenterology;  Laterality: N/A;    There were no vitals filed for this visit.    Subjective Assessment - 05/09/21 0937     Subjective Pt returns to OPPT s/p Lt ankle fx s/p IM nail on 03/12/21.  She presents today in w/c with WB restrictions of 25%.  She does not have shoe on LLE today and advised to bring next visit.    Pertinent History DDD cervical, HTN, h/o left ankle sprain, closed fx of distal end of left tibia, closed  fx of promimal fibula    Limitations Standing;Walking;House hold activities    Patient Stated Goals pain free independent mobility    Currently in Pain? Yes    Pain Score 6    up to 10/10; at best 3/10   Pain Location Ankle   and up to knee   Pain Orientation Left    Pain Descriptors / Indicators Aching;Sharp;Shooting;Sore;Stabbing    Pain Type Chronic pain;Acute pain;Surgical pain    Pain Onset More than a month ago    Pain Frequency Constant    Aggravating  Factors  as pain meds wear off    Pain Relieving Factors meds (oxy, ibuprofen)                [facility] PT Assessment - 05/09/21 0934       Assessment   Medical Diagnosis M79.605 (ICD-10-CM) - Pain of left lower extremity due to injury    Referring Provider (PT) Jessy Oto, MD    Onset Date/Surgical Date 03/12/21    Hand Dominance Right    Next MD Visit not scheduled    Prior Therapy in Spring/Summer 2022 for Rt ankle fx      Precautions   Precautions Fall      Restrictions   Weight Bearing Restrictions Yes    LLE Weight Bearing Partial weight bearing    LLE Partial Weight Bearing Percentage or Pounds 25%      Balance Screen   Has the patient fallen in the past 6 months Yes    How many times? 1    Has the patient had a decrease in activity level because of a fear of falling?  Yes    Is the patient reluctant to leave their home because of a fear of falling?  No      Home Environment   Living Environment Private residence    Living Arrangements Spouse/significant other    Available Help at Discharge Family    Type of [place] Access Level entry    [place] One level;Able to live on main level with bedroom/bathroom   bonus room upstairs   [place] - 2 wheels;Cane - single point;Crutches    Additional Comments ADA compliant home      Prior Function   Level of Independence Independent    Vocation Unemployed    Vocation Requirements currently out of work (left prior position), considering options for employment    Leisure dog walks, go to [employer], play cards      Cognition   Overall Cognitive Status Within Functional Limits for tasks assessed      Observation/Other Assessments   Focus on Therapeutic Outcomes (FOTO)  32 (predicted 59)      AROM   Overall AROM Comments measured in sitting in W/C with foot propped on PT's thigh    Right/Left Ankle Left    Left Ankle Dorsiflexion -14    Left Ankle Plantar Flexion 48    Left Ankle Inversion 16     Left Ankle Eversion 14      PROM   Right/Left Ankle Left    Left Ankle Dorsiflexion -14    Left Ankle Plantar Flexion 51    Left Ankle Inversion 18    Left Ankle Eversion 12      Strength   Overall Strength Comments deferred formal strength testing - disuse atrophy noted in LLE  Objective measurements completed on examination: See above findings.       [facility] Adult PT Treatment/Exercise - 05/09/21 0934       Ambulation/Gait   Gait Comments deferred today due to WB status and pt without shoe - will perform/review next visit      Exercises   Other Exercises  HEP instruction/performance c cues for techniques, handout provided.  Trial set performed of each for comprehension and symptom assessment.                     PT Education - 05/09/21 1006     Education Details HEP    Person(s) Educated Patient    Methods Explanation;Demonstration;Handout    Comprehension Verbalized understanding;Returned demonstration;Need further instruction              PT Short Term Goals - 05/09/21 1244       PT SHORT TERM GOAL #1   Title Patient will demonstrate independent use of home exercise program to maintain progress from in clinic treatments.    Time 4    Period Weeks    Status New    Target Date 06/06/21               PT Long Term Goals - 05/09/21 1244       PT LONG TERM GOAL #1   Title Patient will demonstrate/report pain at worst less than or equal to 2/10 to facilitate minimal limitation in daily activity secondary to pain symptoms.    Time 8    Period Weeks    Status New    Target Date 07/04/21      PT LONG TERM GOAL #2   Title Patient will demonstrate independent use of home exercise program to facilitate ability to maintain/progress functional gains from skilled physical therapy services.    Time 8    Period Weeks    Status New    Target Date 07/04/21      PT LONG TERM GOAL #3   Title Pt. will  demonstrate FOTO outcome > or = 59 to indicated reduced disability due to condition.    Time 8    Period Weeks    Status New    Target Date 07/04/21      PT LONG TERM GOAL #4   Title Pt. will demonstrate modified independent community ambulation > 300 ft s deviation with SPC for improved mobility and decreased fall risk    Time 8    Period Weeks    Status New    Target Date 07/04/21      PT LONG TERM GOAL #5   Title Pt. will demonstrate Lt ankle df to at least neutral, inversion/eversion > 25 degrees to facilitate usual mobility for daily walking, standing, transfers at PLOF.    Time 8    Period Weeks    Status New    Target Date 07/04/21      PT LONG TERM GOAL #6   Title Pt. will demonstrate Lt ankle MMT 4/5 throughout to facilitate usual daily and recreational activity at PLOF.    Time 8    Period Weeks    Status New    Target Date 07/04/21      PT LONG TERM GOAL #7   Title -                    Plan - 05/09/21 1226     Clinical Impression Statement Pt is a 61 y/o female  who presents to OPPT s/p Lt ankle fx with IM nail.  She was seen at this clinic previously for a Rt ankle fx.  Pt demonstrates decreased ROM and strength as well as decreased balance with hx of multiple falls affecting safe mobility.  She will benefit from OPPT to address deficits listed.    Personal Factors and Comorbidities Comorbidity 3+    Comorbidities DDD cervical, HTN, extensive vestibular complication history, Rt ankle fx    Examination-Activity Limitations Bend;Squat;Stairs;Carry;Stand;Transfers;Dressing;Locomotion Level    Examination-Participation Restrictions Cleaning;Community Activity;Shop;Driving;Yard Work;Laundry;Occupation    Stability/Clinical Decision Making Evolving/Moderate complexity    Clinical Decision Making Moderate    Rehab Potential Good    PT Frequency 1x / week   set to 1x/wk due to insurance/financial obligation   PT Duration 8 weeks    PT Treatment/Interventions  ADLs/Self Care Home Management;Cryotherapy;Electrical Stimulation;Iontophoresis 4mg /ml Dexamethasone;Moist Heat;Balance training;Therapeutic exercise;Therapeutic activities;Functional mobility training;Stewart training;Gait training;DME Instruction;Ultrasound;Neuromuscular re-education;Patient/family education;Passive range of motion;Taping;Vasopneumatic Device;Manual techniques;Canalith Repostioning;Vestibular    PT Next Visit Plan practice 25% WB and work on amb with RW.  review HEP and add theraband/BOSU resistance exercises    PT Home Exercise Plan [email or web address]    Consulted and Agree with Plan of Care Patient             Patient will benefit from skilled therapeutic intervention in order to improve the following deficits and impairments:  Abnormal gait, Decreased endurance, Hypomobility, Increased edema, Decreased activity tolerance, Decreased strength, Pain, Decreased balance, Decreased mobility, Difficulty walking, Impaired perceived functional ability, Improper body mechanics, Impaired flexibility, Decreased coordination, Decreased range of motion, Dizziness, Impaired sensation  Visit Diagnosis: Pain in left ankle and joints of left foot - Plan: PT plan of care cert/re-cert  Muscle weakness (generalized) - Plan: PT plan of care cert/re-cert  Stiffness of left ankle, not elsewhere classified - Plan: PT plan of care cert/re-cert  Difficulty in walking, not elsewhere classified - Plan: PT plan of care cert/re-cert  Localized edema - Plan: PT plan of care cert/re-cert  Unsteadiness on feet - Plan: PT plan of care cert/re-cert  Dizziness and giddiness - Plan: PT plan of care cert/re-cert     Problem List Patient Active Problem List   Diagnosis Date Noted   Hypocalcemia 03/24/2021   Type 2 diabetes mellitus with hyperglycemia, with long-term current use of insulin ([place]) 03/24/2021   Secondary esophageal varices without bleeding ([place])    Closed fracture of distal end of left tibia,  initial encounter 03/12/2021    Class: Acute   Closed fracture of proximal fibula 03/12/2021    Class: Acute   Displaced spiral fracture of shaft of left tibia, initial encounter for closed fracture 03/12/2021   Impingement of right ankle joint    Gastric varices without bleeding 12/06/2020   Portal hypertension ([place]) 09/30/2020   Dilated bile duct 13/24/4010   Alcoholic cirrhosis of liver with ascites ([place]) 09/29/2020   Ascites 09/17/2020   Right foot pain 09/17/2020   Laceration of lesser toe of right foot without foreign body present or damage to nail 09/17/2020   Acute right ankle pain 09/17/2020   Frequent falls 09/14/2020   Closed fracture of ankle 09/14/2020   Foot sprain, right, subsequent encounter 08/26/2020   Sprain of anterior talofibular ligament of right ankle 08/26/2020   Splenomegaly 08/09/2020   Malnutrition of moderate degree ([place]) 08/08/2020   Transaminitis 08/06/2020   Serum total bilirubin elevated 08/06/2020   Fall at home, initial encounter 08/06/2020   Generalized weakness 08/05/2020   Idiopathic  peripheral neuropathy 07/13/2020   Type 2 diabetes mellitus with diabetic polyneuropathy, with long-term current use of insulin ([place]) 07/13/2020   Persistent cough for 3 weeks or longer 07/04/2020   Low HDL (under 40) 06/21/2020   Post-nasal drip 06/21/2020   Allergic rhinitis 06/21/2020   Elevated alkaline phosphatase level 06/21/2020   Duodenitis - edema ? andgioedema 10/30/2018   Chronic alcoholic pancreatitis ([place]) 09/02/2018   Diabetes mellitus with circulatory complication, HTN ([place]) 87/19/9412   Dizziness 12/26/2017   Chronic pain 05/17/2017   Osteoarthritis of spine with radiculopathy, cervical region 09/17/2016   B12 deficiency 10/21/2014   Neuropathic pain of both legs 02/11/2014   Hepatic steatosis 04/27/2013   GAD (generalized anxiety disorder) 09/23/2009   Hyperlipidemia associated with type 2 diabetes mellitus ([place]) 11/03/2008   Hypertension  associated with diabetes ([place]) 09/29/2008   PAP SMEAR, ABNORMAL 09/29/2008      Laureen Abrahams, PT, DPT 05/09/21 12:49 PM     [facility] [address] [place], [place], [address] Phone: [phone]   Fax:  [phone]  Name: Mallory Stewart MRN: [id number] Date of Birth: 1961/02/15

## 2021-05-09 NOTE — Patient Instructions (Signed)
Access Code: [email or web address] URL: [email or web address] Date: 05/09/2021 Prepared by: Moshe Cipro  Exercises Seated Ankle Pumps on Table - 3-5 x daily - 7 x weekly - 2-3 sets - 10 reps Seated Ankle Circles - 3-5 x daily - 7 x weekly - 2-3 sets - 10 reps Seated Ankle Alphabet - 3-5 x daily - 7 x weekly - 2-3 sets - 1 a-z Seated Calf Stretch with Strap - 5 x daily - 7 x weekly - 1 sets - 3-5 reps - 20-30 sec hold

## 2021-05-09 NOTE — Progress Notes (Signed)
Followed by ENDO 

## 2021-05-11 ENCOUNTER — Ambulatory Visit (INDEPENDENT_AMBULATORY_CARE_PROVIDER_SITE_OTHER): Payer: Self-pay | Admitting: Internal Medicine

## 2021-05-11 ENCOUNTER — Encounter: Payer: Self-pay | Admitting: Internal Medicine

## 2021-05-11 VITALS — BP 100/64 | HR 104 | Ht 69.0 in | Wt 150.0 lb

## 2021-05-11 DIAGNOSIS — E1165 Type 2 diabetes mellitus with hyperglycemia: Secondary | ICD-10-CM

## 2021-05-11 DIAGNOSIS — I8510 Secondary esophageal varices without bleeding: Secondary | ICD-10-CM

## 2021-05-11 DIAGNOSIS — K860 Alcohol-induced chronic pancreatitis: Secondary | ICD-10-CM

## 2021-05-11 DIAGNOSIS — K7031 Alcoholic cirrhosis of liver with ascites: Secondary | ICD-10-CM

## 2021-05-11 DIAGNOSIS — Z794 Long term (current) use of insulin: Secondary | ICD-10-CM

## 2021-05-11 NOTE — Patient Instructions (Addendum)
If you are age 59 or older, your body mass index should be between 23-30. Your Body mass index is 22.15 kg/m. If this is out of the aforementioned range listed, please consider follow up with your Primary Care Provider.  If you are age 79 or younger, your body mass index should be between 19-25. Your Body mass index is 22.15 kg/m. If this is out of the aformentioned range listed, please consider follow up with your Primary Care Provider.   ________________________________________________________  The [facility] providers would like to encourage you to use [facility] to communicate with providers for non-urgent requests or questions.  Due to long hold times on the telephone, sending your provider a message by [facility] may be a faster and more efficient way to get a response.  Please allow 48 business hours for a response.  Please remember that this is for non-urgent requests.  _______________________________________________________  Please call your endocrinology Dr  Lonzo Cloud to get an appointment.  Call us in late February for a late March/early April appointment.  Don't take the metoprolol.  I appreciate the opportunity to care for you. Stan Head, MD, [facility]

## 2021-05-11 NOTE — Progress Notes (Signed)
Mallory Stewart 61 y.o. 1960/11/18 [id number]  Assessment & Plan:   Encounter Diagnoses  Name Primary?   Alcoholic cirrhosis of liver with ascites (HCC) Yes   Chronic alcoholic pancreatitis ([place])    Secondary esophageal varices without bleeding (HCC)    Type 2 diabetes mellitus with hyperglycemia, with long-term current use of insulin ([place])    She is improved overall with respect to fluid management and claims abstinence still.  Continue current medical therapy.  She was switched from metoprolol to carvedilol.  Pulse is up a little bit today but her blood pressure is 100/64 it was similar when we did a BP check with a pulse of 84 in December so I am not going to adjust the dose right now we will see about that in follow-up.   Her hyperglycemia may be contributing to weight loss as well and I have urged her to get back with her endocrinologist Dr. Kelton Pillar  She does not seem to be having symptoms from her pancreatitis right now although I think diabetes issues could be related to chronic pancreatitis at least in part.  Return to see me in April sooner if needed    Subjective:   Chief Complaint: Follow-up of ascites and cirrhosis  HPI 61 year old white woman with alcoholic cirrhosis with severe ascites problems chronic alcoholic pancreatitis and portal hypertension with isolated gastroesophageal varices who presents for follow-up.  She has had problems with falls and ankle fractures and has a left ankle fracture which is improving but is still only on 25% weightbearing.  She had been hospitalized in the fall and the thought was perhaps her diuretics had made her fall and they were held and then reduced and she had a significant fluid retention.  She has gotten back on regular dosing of furosemide and spironolactone as 1 can see from the med list.  Her weight has dropped off though we do not think the weights listed below are accurate i.e. the 171.  She has been weighing  herself and thinks she is lost 10 or 15 pounds over the last month or so.  I think she had a cast at 1 point when her weight was 171 pounds also.  At any rate her abdominal distention and edema is much much better and she is pleased.  Blood sugars have been over 200 however both on our labs and she says at home.  She does not have a follow-up appointment with endocrinology scheduled.  She tells me she is not drinking any alcohol though she misses it. Wt Readings from Last 3 Encounters:  05/11/21 150 lb (68 kg)  05/02/21 171 lb (77.6 kg)  03/29/21 171 lb (77.6 kg)      Allergies  Allergen Reactions   Gluten Meal Other (See Comments)    Stomach distress   Zocor [Simvastatin] Other (See Comments)    Elevated lfts?   Current Meds  Medication Sig   ALPRAZolam (XANAX) 0.25 MG tablet TAKE 1 TABLET(0.25 MG) BY MOUTH DAILY AS NEEDED FOR ANXIETY   amitriptyline (ELAVIL) 75 MG tablet Take 1 tablet (75 mg total) by mouth at bedtime.   aspirin EC 81 MG EC tablet Take 1 tablet (81 mg total) by mouth 2 (two) times daily. Swallow whole.   Blood Glucose Monitoring Suppl (ONE TOUCH ULTRA 2) w/Device KIT Use to check blood sugars two times day   carvedilol (COREG) 12.5 MG tablet TAKE 1 TABLET(12.5 MG) BY MOUTH TWICE DAILY WITH A MEAL   cetirizine (ZYRTEC)  10 MG tablet Take 10 mg by mouth at bedtime.   empagliflozin (JARDIANCE) 25 MG TABS tablet Take 1 tablet (25 mg total) by mouth daily before breakfast.   ferrous sulfate 325 (65 FE) MG tablet Take 1 tablet (325 mg total) by mouth 2 (two) times daily with a meal.   furosemide (LASIX) 20 MG tablet Take 1 tablet (20 mg total) by mouth daily.   insulin glargine, 1 Unit Dial, (TOUJEO SOLOSTAR) 300 UNIT/ML Solostar Pen Inject 30 Units into the skin daily at 2 PM.   Insulin Pen Needle 32G X 4 MM MISC 1 Device by Does not apply route daily in the afternoon.   Lancets (ONETOUCH ULTRASOFT) lancets Use to check blood sugar two times a day.   losartan (COZAAR) 100  MG tablet Take 100 mg by mouth daily.   Multiple Vitamin (MULTIVITAMIN WITH MINERALS) TABS tablet Take 1 tablet by mouth daily.   ONETOUCH ULTRA test strip CHECK BLOOD SUGAR TWO TIMES DAILY AS DIRECTED   oxyCODONE (OXY IR/ROXICODONE) 5 MG immediate release tablet Take 1-2 tablets (5-10 mg total) by mouth every 4 (four) hours as needed for moderate pain (pain score 4-6).   pantoprazole (PROTONIX) 40 MG tablet TAKE 1 TABLET BY MOUTH TWICE DAILY BEFORE A MEAL   spironolactone (ALDACTONE) 50 MG tablet Take 1 tablet (50 mg total) by mouth daily.   [DISCONTINUED] metoprolol tartrate (LOPRESSOR) 50 MG tablet Take 50 mg by mouth 2 (two) times daily.   Past Medical History:  Diagnosis Date   Alcohol-induced chronic pancreatitis (HCC)    Alcoholic cirrhosis ([place])    B12 deficiency    Concussion    DDD (degenerative disc disease), cervical    Diabetes ([place])    DKA, type 2 ([place])    Gastric outlet obstruction 08/12/2018   Hypertension    Neuropathy    Pancreatic pseudocyst/cyst 05/06/2013   Seasonal allergies    Past Surgical History:  Procedure Laterality Date   ANKLE ARTHROSCOPY Right 12/20/2020   Procedure: RIGHT ANKLE ARTHROSCOPIC DEBRIDEMENT;  Surgeon: Newt Minion, MD;  Location: [place];  Service: Orthopedics;  Laterality: Right;   BIOPSY  01/19/2019   Procedure: BIOPSY;  Surgeon: Rush Landmark Telford Nab., MD;  Location: [place];  Service: Gastroenterology;;   CHOLECYSTECTOMY N/A 07/28/2016   Procedure: LAPAROSCOPIC CHOLECYSTECTOMY WITH INTRAOPERATIVE CHOLANGIOGRAM;  Surgeon: Mickeal Skinner, MD;  Location: [place];  Service: General;  Laterality: N/A;   ESOPHAGOGASTRODUODENOSCOPY (EGD) WITH PROPOFOL N/A 08/13/2018   Procedure: ESOPHAGOGASTRODUODENOSCOPY (EGD) WITH PROPOFOL;  Surgeon: Irving Copas., MD;  Location: [place];  Service: Gastroenterology;  Laterality: N/A;   ESOPHAGOGASTRODUODENOSCOPY (EGD) WITH PROPOFOL N/A 01/19/2019   Procedure:  ESOPHAGOGASTRODUODENOSCOPY (EGD) WITH PROPOFOL;  Surgeon: Rush Landmark Telford Nab., MD;  Location: [place];  Service: Gastroenterology;  Laterality: N/A;   IR PARACENTESIS  08/08/2020   IR PARACENTESIS  03/13/2021   LEEP  1990's   ORIF FOOT FRACTURE  09/2008   L 5th metatarsal   REFRACTIVE SURGERY  2000   TIBIA IM NAIL INSERTION Left 03/12/2021   Procedure: INTRAMEDULLARY (IM) NAIL TIBIAL;  Surgeon: Jessy Oto, MD;  Location: [place];  Service: Orthopedics;  Laterality: Left;   TONSILLECTOMY     UPPER ESOPHAGEAL ENDOSCOPIC ULTRASOUND (EUS) N/A 08/13/2018   Procedure: UPPER ESOPHAGEAL ENDOSCOPIC ULTRASOUND (EUS);  Surgeon: Irving Copas., MD;  Location: [place];  Service: Gastroenterology;  Laterality: N/A;   UPPER ESOPHAGEAL ENDOSCOPIC ULTRASOUND (EUS) N/A 01/19/2019   Procedure: UPPER ESOPHAGEAL ENDOSCOPIC ULTRASOUND (  EUS);  Surgeon: Mansouraty, Telford Nab., MD;  Location: [place];  Service: Gastroenterology;  Laterality: N/A;   Social History   Social History Narrative   Patient is married one stepson    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 not traveling   regular exercise , walking 4-5 times a week   Diet fruits and veggies, water   Alcohol at least several glasses of wine a week Stopped 08/2018   Never smoker, no drug use   family history includes Atrial fibrillation in her sister; Brain cancer in her maternal grandfather; Breast cancer in her maternal grandmother; Cancer in her paternal grandfather; Coronary artery disease in her paternal grandmother; Healthy in her brother and brother; Heart attack (age of onset: 68) in her paternal grandmother; Other in her father and mother; Prostate cancer in her father; Stroke in her father.   Review of Systems As above  Objective:   Physical Exam BP 100/64    Pulse (!) 104    Ht $R'5\' 9"'uR$  (1.753 m)    Wt 150 lb (68 kg)    BMI 22.15 kg/m   NAD in  w/c anicteric Lungs cta Cor NL S1S2 no rmg Abd soft, NT, could have small ascites Ext no c/ce Alert and oriented x 3 no asterixis

## 2021-05-15 ENCOUNTER — Encounter: Payer: Self-pay | Admitting: Rehabilitative and Restorative Service Providers"

## 2021-05-15 ENCOUNTER — Other Ambulatory Visit: Payer: Self-pay

## 2021-05-15 ENCOUNTER — Ambulatory Visit (INDEPENDENT_AMBULATORY_CARE_PROVIDER_SITE_OTHER): Payer: 59 | Admitting: Rehabilitative and Restorative Service Providers"

## 2021-05-15 DIAGNOSIS — R262 Difficulty in walking, not elsewhere classified: Secondary | ICD-10-CM | POA: Diagnosis not present

## 2021-05-15 DIAGNOSIS — M25672 Stiffness of left ankle, not elsewhere classified: Secondary | ICD-10-CM | POA: Diagnosis not present

## 2021-05-15 DIAGNOSIS — M6281 Muscle weakness (generalized): Secondary | ICD-10-CM

## 2021-05-15 DIAGNOSIS — R42 Dizziness and giddiness: Secondary | ICD-10-CM

## 2021-05-15 DIAGNOSIS — M25572 Pain in left ankle and joints of left foot: Secondary | ICD-10-CM

## 2021-05-15 DIAGNOSIS — R600 Localized edema: Secondary | ICD-10-CM

## 2021-05-15 DIAGNOSIS — R2681 Unsteadiness on feet: Secondary | ICD-10-CM

## 2021-05-15 NOTE — Therapy (Signed)
[facility] [address] [place], [place], [address] Phone: [phone]   Fax:  [phone]  Physical Therapy Treatment  Patient Details  Name: Mallory Stewart MRN: [id number] Date of Birth: 1960/09/26 Referring Provider (PT): Jessy Oto, MD   Encounter Date: 05/15/2021   PT End of Session - 05/15/21 1033     Visit Number 2    Number of Visits 8    Date for PT Re-Evaluation 07/04/21    Authorization Type [employer] $150 copay after deductible met    Authorization - Visit Number 2    PT Start Time 8502    PT Stop Time 1053    PT Time Calculation (min) 39 min    Activity Tolerance Patient tolerated treatment well    Behavior During Therapy [facility] for tasks assessed/performed             Past Medical History:  Diagnosis Date   Alcohol-induced chronic pancreatitis ([place])    Alcoholic cirrhosis ([place])    B12 deficiency    Concussion    DDD (degenerative disc disease), cervical    Diabetes ([place])    DKA, type 2 ([place])    Gastric outlet obstruction 08/12/2018   Hypertension    Neuropathy    Pancreatic pseudocyst/cyst 05/06/2013   Seasonal allergies     Past Surgical History:  Procedure Laterality Date   ANKLE ARTHROSCOPY Right 12/20/2020   Procedure: RIGHT ANKLE ARTHROSCOPIC DEBRIDEMENT;  Surgeon: Newt Minion, MD;  Location: [place];  Service: Orthopedics;  Laterality: Right;   BIOPSY  01/19/2019   Procedure: BIOPSY;  Surgeon: Rush Landmark Telford Nab., MD;  Location: [place];  Service: Gastroenterology;;   CHOLECYSTECTOMY N/A 07/28/2016   Procedure: LAPAROSCOPIC CHOLECYSTECTOMY WITH INTRAOPERATIVE CHOLANGIOGRAM;  Surgeon: Mickeal Skinner, MD;  Location: [place];  Service: General;  Laterality: N/A;   ESOPHAGOGASTRODUODENOSCOPY (EGD) WITH PROPOFOL N/A 08/13/2018   Procedure: ESOPHAGOGASTRODUODENOSCOPY (EGD) WITH PROPOFOL;  Surgeon: Irving Copas., MD;  Location: [place];  Service: Gastroenterology;   Laterality: N/A;   ESOPHAGOGASTRODUODENOSCOPY (EGD) WITH PROPOFOL N/A 01/19/2019   Procedure: ESOPHAGOGASTRODUODENOSCOPY (EGD) WITH PROPOFOL;  Surgeon: Rush Landmark Telford Nab., MD;  Location: [place];  Service: Gastroenterology;  Laterality: N/A;   IR PARACENTESIS  08/08/2020   IR PARACENTESIS  03/13/2021   LEEP  1990's   ORIF FOOT FRACTURE  09/2008   L 5th metatarsal   REFRACTIVE SURGERY  2000   TIBIA IM NAIL INSERTION Left 03/12/2021   Procedure: INTRAMEDULLARY (IM) NAIL TIBIAL;  Surgeon: Jessy Oto, MD;  Location: [place];  Service: Orthopedics;  Laterality: Left;   TONSILLECTOMY     UPPER ESOPHAGEAL ENDOSCOPIC ULTRASOUND (EUS) N/A 08/13/2018   Procedure: UPPER ESOPHAGEAL ENDOSCOPIC ULTRASOUND (EUS);  Surgeon: Irving Copas., MD;  Location: [place];  Service: Gastroenterology;  Laterality: N/A;   UPPER ESOPHAGEAL ENDOSCOPIC ULTRASOUND (EUS) N/A 01/19/2019   Procedure: UPPER ESOPHAGEAL ENDOSCOPIC ULTRASOUND (EUS);  Surgeon: Irving Copas., MD;  Location: [place];  Service: Gastroenterology;  Laterality: N/A;    There were no vitals filed for this visit.   Subjective Assessment - 05/15/21 1019     Subjective Pt. indicated pain was 5/10 in ankle today.  Feeling some in shin and also in Rt leg as well.    Pertinent History DDD cervical, HTN, h/o left ankle sprain, closed fx of distal end of left tibia, closed fx of promimal fibula    Limitations Standing;Walking;House hold activities    Patient Stated Goals pain free independent  mobility    Currently in Pain? Yes    Pain Score 5     Pain Location Ankle    Pain Orientation Left    Pain Descriptors / Indicators Dull;Aching;Stabbing    Pain Type Surgical pain;Chronic pain;Acute pain    Pain Onset More than a month ago    Pain Frequency Constant    Aggravating Factors  nightime insidious stabbing    Pain Relieving Factors medicine                [facility] PT Assessment - 05/15/21 0001       Strength    Left Ankle Dorsiflexion 5/5    Left Ankle Plantar Flexion 2+/5    Left Ankle Inversion 5/5    Left Ankle Eversion 4/5                           [facility] Adult PT Treatment/Exercise - 05/15/21 0001       Ambulation/Gait   Gait Comments Time spent in 25%WB education c FWW and CGA c cues feedback verball from clinician reading scale read out (approx. 36 lbs max for 25% WB).  Practiced step on/off 2 inch step to mimic fwd ambulation x 15      Exercises   Other Exercises  Review of HEP      Ankle Exercises: Stretches   Gastroc Stretch 30 seconds;3 reps   c strap in long sitting     Ankle Exercises: Seated   Other Seated Ankle Exercises ankle circes cw, ccw, ankle pumps 10 x each in long sitting,                     PT Education - 05/15/21 1051     Education Details HEP progression with isometrics, 25% WB training    Person(s) Educated Patient    Methods Explanation;Demonstration;Verbal cues;Handout    Comprehension Returned demonstration;Verbalized understanding              PT Short Term Goals - 05/15/21 1033       PT SHORT TERM GOAL #1   Title Patient will demonstrate independent use of home exercise program to maintain progress from in clinic treatments.    Time 4    Period Weeks    Status On-going    Target Date 06/06/21               PT Long Term Goals - 05/09/21 1244       PT LONG TERM GOAL #1   Title Patient will demonstrate/report pain at worst less than or equal to 2/10 to facilitate minimal limitation in daily activity secondary to pain symptoms.    Time 8    Period Weeks    Status New    Target Date 07/04/21      PT LONG TERM GOAL #2   Title Patient will demonstrate independent use of home exercise program to facilitate ability to maintain/progress functional gains from skilled physical therapy services.    Time 8    Period Weeks    Status New    Target Date 07/04/21      PT LONG TERM GOAL #3   Title Pt. will  demonstrate FOTO outcome > or = 59 to indicated reduced disability due to condition.    Time 8    Period Weeks    Status New    Target Date 07/04/21      PT LONG TERM GOAL #4  Title Pt. will demonstrate modified independent community ambulation > 300 ft s deviation with SPC for improved mobility and decreased fall risk    Time 8    Period Weeks    Status New    Target Date 07/04/21      PT LONG TERM GOAL #5   Title Pt. will demonstrate Lt ankle df to at least neutral, inversion/eversion > 25 degrees to facilitate usual mobility for daily walking, standing, transfers at PLOF.    Time 8    Period Weeks    Status New    Target Date 07/04/21      PT LONG TERM GOAL #6   Title Pt. will demonstrate Lt ankle MMT 4/5 throughout to facilitate usual daily and recreational activity at PLOF.    Time 8    Period Weeks    Status New    Target Date 07/04/21      PT LONG TERM GOAL #7   Title -                   Plan - 05/15/21 1055     Clinical Impression Statement Pt. demonstrated fair performance on maintaining 25% only WB in standing intervention noted in gait training.  Pt. was able to perform several reps of correct pressure at end of training but may benefit from additional treatment.  Good isometric holds noted c eversion as only pain noted intervention.  Continued skilled PT services warranted at this time.    Personal Factors and Comorbidities Comorbidity 3+    Comorbidities DDD cervical, HTN, extensive vestibular complication history, Rt ankle fx    Examination-Activity Limitations Bend;Squat;Stairs;Carry;Stand;Transfers;Dressing;Locomotion Level    Examination-Participation Restrictions Cleaning;Community Activity;Shop;Driving;Yard Work;Laundry;Occupation    Stability/Clinical Decision Making Evolving/Moderate complexity    Rehab Potential Good    PT Frequency 1x / week   set to 1x/wk due to insurance/financial obligation   PT Duration 8 weeks    PT  Treatment/Interventions ADLs/Self Care Home Management;Cryotherapy;Electrical Stimulation;Iontophoresis 56m/ml Dexamethasone;Moist Heat;Balance training;Therapeutic exercise;Therapeutic activities;Functional mobility training;Stair training;Gait training;DME Instruction;Ultrasound;Neuromuscular re-education;Patient/family education;Passive range of motion;Taping;Vasopneumatic Device;Manual techniques;Canalith Repostioning;Vestibular    PT Next Visit Plan practice 25% WB continued, theraband intervention progression.    PT Home Exercise Plan [email or web address]    Consulted and Agree with Plan of Care Patient             Patient will benefit from skilled therapeutic intervention in order to improve the following deficits and impairments:  Abnormal gait, Decreased endurance, Hypomobility, Increased edema, Decreased activity tolerance, Decreased strength, Pain, Decreased balance, Decreased mobility, Difficulty walking, Impaired perceived functional ability, Improper body mechanics, Impaired flexibility, Decreased coordination, Decreased range of motion, Dizziness, Impaired sensation  Visit Diagnosis: Pain in left ankle and joints of left foot  Muscle weakness (generalized)  Stiffness of left ankle, not elsewhere classified  Difficulty in walking, not elsewhere classified  Localized edema  Unsteadiness on feet  Dizziness and giddiness     Problem List Patient Active Problem List   Diagnosis Date Noted   Hypocalcemia 03/24/2021   Type 2 diabetes mellitus with hyperglycemia, with long-term current use of insulin ([employer] 03/24/2021   [place] varices [place] bleeding [place]    Closed fracture of distal end of left tibia, initial encounter 03/12/2021    Class: Acute   Closed fracture of proximal fibula 03/12/2021    Class: Acute   Displaced spiral fracture of shaft of left tibia, initial encounter for closed fracture 03/12/2021   Impingement of right ankle joint  Gastric varices  without bleeding 12/06/2020   Portal hypertension ([place]) 09/30/2020   Dilated bile duct 25/09/3974   Alcoholic cirrhosis of liver with ascites ([place]) 09/29/2020   Ascites 09/17/2020   Right foot pain 09/17/2020   Laceration of lesser toe of right foot without foreign body present or damage to nail 09/17/2020   Acute right ankle pain 09/17/2020   Frequent falls 09/14/2020   Closed fracture of ankle 09/14/2020   Foot sprain, right, subsequent encounter 08/26/2020   Sprain of anterior talofibular ligament of right ankle 08/26/2020   Splenomegaly 08/09/2020   Malnutrition of moderate degree ([place]) 08/08/2020   Transaminitis 08/06/2020   Serum total bilirubin elevated 08/06/2020   Fall at home, initial encounter 08/06/2020   Generalized weakness 08/05/2020   Idiopathic peripheral neuropathy 07/13/2020   Type 2 diabetes mellitus with diabetic polyneuropathy, with long-term current use of insulin ([place]) 07/13/2020   Persistent cough for 3 weeks or longer 07/04/2020   Low HDL (under 40) 06/21/2020   Post-nasal drip 06/21/2020   Allergic rhinitis 06/21/2020   Elevated alkaline phosphatase level 06/21/2020   Duodenitis - edema ? andgioedema 10/30/2018   Chronic alcoholic pancreatitis ([place]) 09/02/2018   Diabetes mellitus with circulatory complication, HTN ([place]) 73/41/9379   Dizziness 12/26/2017   Chronic pain 05/17/2017   Osteoarthritis of spine with radiculopathy, cervical region 09/17/2016   B12 deficiency 10/21/2014   Neuropathic pain of both legs 02/11/2014   Hepatic steatosis 04/27/2013   GAD (generalized anxiety disorder) 09/23/2009   Hyperlipidemia associated with type 2 diabetes mellitus ([place]) 11/03/2008   Hypertension associated with diabetes ([place]) 09/29/2008   PAP SMEAR, ABNORMAL 09/29/2008    Scot Jun, PT, DPT, OCS, ATC 05/15/21  11:34 AM    [facility] [address] [place], [place], [address] Phone: [phone]   Fax:   [phone]  Name: Mallory Stewart MRN: [id number] Date of Birth: 1960-12-29

## 2021-05-15 NOTE — Patient Instructions (Signed)
Access Code: [email or web address] URL: [email or web address] Date: 05/15/2021 Prepared by: Chyrel Masson  Exercises Seated Ankle Pumps on Table - 3-5 x daily - 7 x weekly - 2-3 sets - 10 reps Seated Ankle Circles - 3-5 x daily - 7 x weekly - 2-3 sets - 10 reps Seated Ankle Alphabet - 3-5 x daily - 7 x weekly - 2-3 sets - 1 a-z Seated Calf Stretch with Strap - 5 x daily - 7 x weekly - 1 sets - 3-5 reps - 20-30 sec hold Long Sitting Isometric Ankle Plantarflexion with Ball at [employer] - 2 x daily - 7 x weekly - 1 sets - 10 reps - 5 hold Long Sitting Isometric Ankle Inversion in Dorsiflexion with Ball at Wall - 2 x daily - 7 x weekly - 1 sets - 10 reps - 5 hold Long Sitting Isometric Ankle Eversion in Plantar Flexion with Ball at [employer] (Mirrored) - 2 x daily - 7 x weekly - 1 sets - 10 reps Isometric Ankle Dorsiflexion and Plantarflexion - 2 x daily - 7 x weekly - 1 sets - 10 reps - 5 hold

## 2021-05-16 ENCOUNTER — Other Ambulatory Visit: Payer: Self-pay | Admitting: Specialist

## 2021-05-16 MED ORDER — OXYCODONE HCL 5 MG PO TABS: 5.0000 mg | ORAL_TABLET | ORAL | 0 refills | Status: DC | PRN

## 2021-05-16 NOTE — Telephone Encounter (Signed)
Patient has been advised that her rx was sent to her pharmacy

## 2021-05-16 NOTE — Telephone Encounter (Signed)
Pt called asking for a refill of her hydrocodone rx; pt states she has been having trouble sleeping so she only takes her rx at night. Pt would like a CB as soon as this has been called in since she has to leave town this weekend and she wants to make sure she has it.   [phone]

## 2021-05-17 ENCOUNTER — Encounter: Payer: 59 | Admitting: Rehabilitative and Restorative Service Providers"

## 2021-05-26 ENCOUNTER — Telehealth: Payer: Self-pay | Admitting: Specialist

## 2021-05-26 NOTE — Telephone Encounter (Signed)
Pt called. States there is a bump on the incision she had from surgery in November. She states she got out the shower and the spot was very bloody. She is In [place] right now but says she can come in next week if needed?    Cb [phone]

## 2021-05-26 NOTE — Telephone Encounter (Signed)
I called and wokred in to see Mallory Stewart on Wednesday. I did advise that if it got worse to seek medical treatment there in [place].

## 2021-05-30 ENCOUNTER — Encounter: Payer: 59 | Admitting: Rehabilitative and Restorative Service Providers"

## 2021-05-30 ENCOUNTER — Other Ambulatory Visit: Payer: Self-pay | Admitting: Specialist

## 2021-05-30 MED ORDER — OXYCODONE HCL 5 MG PO TABS: 5.0000 mg | ORAL_TABLET | Freq: Two times a day (BID) | ORAL | 0 refills | Status: DC

## 2021-05-30 NOTE — Telephone Encounter (Signed)
Patient called. She would like a refill on oxycodone.

## 2021-05-31 ENCOUNTER — Ambulatory Visit: Payer: 59 | Admitting: Surgery

## 2021-06-02 ENCOUNTER — Ambulatory Visit: Payer: 59 | Admitting: Surgery

## 2021-06-05 ENCOUNTER — Encounter: Payer: 59 | Admitting: Physical Therapy

## 2021-06-12 ENCOUNTER — Other Ambulatory Visit: Payer: Self-pay | Admitting: Specialist

## 2021-06-12 MED ORDER — OXYCODONE HCL 5 MG PO TABS: 5.0000 mg | ORAL_TABLET | Freq: Two times a day (BID) | ORAL | 0 refills | Status: DC

## 2021-06-12 NOTE — Telephone Encounter (Signed)
I called and advised patient per Dr. Otelia Sergeant that he was cutting her back to 10 tabs that at this point she should e off of it. She states that she understands this

## 2021-06-12 NOTE — Telephone Encounter (Signed)
Pt would like a refill on oxycodone.  °

## 2021-06-12 NOTE — Addendum Note (Signed)
Addended by: Penne Lash, Neysa Bonito N on: 06/12/2021 10:11 AM   Modules accepted: Orders

## 2021-06-14 ENCOUNTER — Encounter: Payer: 59 | Admitting: Physical Therapy

## 2021-06-15 ENCOUNTER — Encounter: Payer: Self-pay | Admitting: Surgery

## 2021-06-15 ENCOUNTER — Other Ambulatory Visit: Payer: Self-pay

## 2021-06-15 ENCOUNTER — Ambulatory Visit (INDEPENDENT_AMBULATORY_CARE_PROVIDER_SITE_OTHER): Payer: 59 | Admitting: Surgery

## 2021-06-15 ENCOUNTER — Ambulatory Visit: Payer: 59

## 2021-06-15 VITALS — BP 96/63 | HR 80 | Ht 69.0 in | Wt 150.0 lb

## 2021-06-15 DIAGNOSIS — M79605 Pain in left leg: Secondary | ICD-10-CM

## 2021-06-15 DIAGNOSIS — S82242A Displaced spiral fracture of shaft of left tibia, initial encounter for closed fracture: Secondary | ICD-10-CM

## 2021-06-15 NOTE — Progress Notes (Signed)
Office Visit Note   Patient: Mallory Stewart           Date of Birth: Jan 07, 1961           MRN: [id number] Visit Date: 06/15/2021              Requested by: Excell Seltzer, MD [address] [place],  [place] [address] PCP: Excell Seltzer, MD   Assessment & Plan: Visit Diagnoses:  1. Pain of left lower extremity due to injury   2. Displaced spiral fracture of shaft of left tibia, initial encounter for closed fracture   Status post left intramedullary tibial nail March 12, 2021 by Dr. Otelia Sergeant  Plan: Since patient did not have CT scan that was previously ordered by Dr. Otelia Sergeant this was rescheduled for tomorrow.  Patient will follow up with Dr. Otelia Sergeant in 2 weeks for recheck to review those results and recheck her leg.  Patient has only been using a postop shoe but today I did put her in a left cam boot.  She can still 25% partial weight-bear in this but I advised her that I want more immobilization for her ankle.  Continue to elevate her foot above heart level to decrease swelling.  Dr. Lowella Bandy can decide if he wants to increase her weightbearing restrictions when he sees her back.  Follow-Up Instructions: Return in about 2 weeks (around 06/29/2021) for WITH DR NITKA TO REVIEW CT SCAN AND POSTOP CHECK (PER Presly Steinruck).   Orders:  Orders Placed This Encounter  Procedures   XR Tibia/Fibula Left   No orders of the defined types were placed in this encounter.     Procedures: No procedures performed   Clinical Data: No additional findings.   Subjective: Chief Complaint  Patient presents with   Left Ankle - Wound Check    HPI 61 year old white female who is status post left intramedullary tibial nail March 12, 2021 by Dr. Otelia Sergeant returns.  She was last seen by Dr. Otelia Sergeant April 26, 2021 and was scheduled to have CT left ankle May 04, 2021 but patient canceled that appointment.  She was supposed to only be 25% partial weightbearing left lower extremity but she has been doing a  fair amount of walking.  She has been using a postop shoe.  There was a question of some drainage from the left ankle incision but this has resolved.  She continues to have pain in the distal tibia.   Objective: Vital Signs: BP 96/63    Pulse 80    Ht 5\' 9"  (1.753 m)    Wt 150 lb (68 kg)    BMI 22.15 kg/m   Physical Exam Pleasant female alert and oriented in no acute distress.  Husband is present.  No signs of infection.  She does have some ankle swelling.  Distal tibia is tender to palpation around the fracture site. Ortho Exam  Specialty Comments:  No specialty comments available.  Imaging: No results found.   PMFS History: Patient Active Problem List   Diagnosis Date Noted   Hypocalcemia 03/24/2021   Type 2 diabetes mellitus with hyperglycemia, with long-term current use of insulin (H[facility]) 03/24/2021   Secondary esophageal varices without bleeding ([facility])    Closed fracture of distal end of left tibia, initial encounter 03/12/2021    Class: Acute   Closed fracture of proximal fibula 03/12/2021    Class: Acute   Displaced spiral fracture of shaft of left tibia, initial encounter for closed fracture 03/12/2021  Impingement of right ankle joint    Gastric varices without bleeding 12/06/2020   Portal hypertension ([facility]) 09/30/2020   Dilated bile duct 09/30/2020   Alcoholic cirrhosis of liver with ascites ([facility]) 09/29/2020   Ascites 09/17/2020   Right foot pain 09/17/2020   Laceration of lesser toe of right foot without foreign body present or damage to nail 09/17/2020   Acute right ankle pain 09/17/2020   Frequent falls 09/14/2020   Closed fracture of ankle 09/14/2020   Foot sprain, right, subsequent encounter 08/26/2020   Sprain of anterior talofibular ligament of right ankle 08/26/2020   Splenomegaly 08/09/2020   Malnutrition of moderate degree ([facility]) 08/08/2020   Transaminitis 08/06/2020   Serum total bilirubin elevated 08/06/2020   Fall at home, initial encounter 08/06/2020    Generalized weakness 08/05/2020   Idiopathic peripheral neuropathy 07/13/2020   Type 2 diabetes mellitus with diabetic polyneuropathy, with long-term current use of insulin ([facility]) 07/13/2020   Persistent cough for 3 weeks or longer 07/04/2020   Low HDL (under 40) 06/21/2020   Post-nasal drip 06/21/2020   Allergic rhinitis 06/21/2020   Elevated alkaline phosphatase level 06/21/2020   Duodenitis - edema ? andgioedema 10/30/2018   Chronic alcoholic pancreatitis ([facility]) 09/02/2018   Diabetes mellitus with circulatory complication, HTN (H[facility]) 08/22/2018   Dizziness 12/26/2017   Chronic pain 05/17/2017   Osteoarthritis of spine with radiculopathy, cervical region 09/17/2016   B12 deficiency 10/21/2014   Neuropathic pain of both legs 02/11/2014   Hepatic steatosis 04/27/2013   GAD (generalized anxiety disorder) 09/23/2009   Hyperlipidemia associated with type 2 diabetes mellitus ([facility]) 11/03/2008   Hypertension associated with diabetes ([facility]) 09/29/2008   PAP SMEAR, ABNORMAL 09/29/2008   Past Medical History:  Diagnosis Date   Alcohol-induced chronic pancreatitis (H[facility])    Alcoholic cirrhosis ([facility])    B12 deficiency    Concussion    DDD (degenerative disc disease), cervical    Diabetes (HCC)    DKA, type 2 (H[facility])    Gastric outlet obstruction 08/12/2018   Hypertension    Neuropathy    Pancreatic pseudocyst/cyst 05/06/2013   Seasonal allergies     Family History  Problem Relation Age of Onset   Other Mother        tachycardia.Marland KitchenMarland Kitchen?afib   Stroke Father        after hernia suegery   Prostate cancer Father    Other Father        global transient amnesia, unclear source   Atrial fibrillation Sister    Healthy Brother    Healthy Brother    Coronary artery disease Paternal Grandmother    Heart attack Paternal Grandmother 38   Brain cancer Maternal Grandfather        ?   Cancer Paternal Grandfather        ?   Breast cancer Maternal Grandmother     Past Surgical History:  Procedure  Laterality Date   ANKLE ARTHROSCOPY Right 12/20/2020   Procedure: RIGHT ANKLE ARTHROSCOPIC DEBRIDEMENT;  Surgeon: Nadara Mustard, MD;  Location: [facility];  Service: Orthopedics;  Laterality: Right;   BIOPSY  01/19/2019   Procedure: BIOPSY;  Surgeon: Meridee Score Netty Starring., MD;  Location: [facility];  Service: Gastroenterology;;   CHOLECYSTECTOMY N/A 07/28/2016   Procedure: LAPAROSCOPIC CHOLECYSTECTOMY WITH INTRAOPERATIVE CHOLANGIOGRAM;  Surgeon: Rodman Pickle, MD;  [facility];  Service: General;  Laterality: N/A;   ESOPHAGOGASTRODUODENOSCOPY (EGD) WITH PROPOFOL N/A 08/13/2018   Procedure: ESOPHAGOGASTRODUODENOSCOPY (EGD) WITH PROPOFOL;  Surgeon: Lemar Lofty.,  MD;  Location: [facility];  Service: Gastroenterology;  Laterality: N/A;   ESOPHAGOGASTRODUODENOSCOPY (EGD) WITH PROPOFOL N/A 01/19/2019   Procedure: ESOPHAGOGASTRODUODENOSCOPY (EGD) WITH PROPOFOL;  Surgeon: Meridee Score Netty Starring., MD;  Location: [facility]COPY;  Service: Gastroenterology;  Laterality: N/A;   IR PARACENTESIS  08/08/2020   IR PARACENTESIS  03/13/2021   LEEP  1990's   ORIF FOOT FRACTURE  09/2008   L 5th metatarsal   REFRACTIVE SURGERY  2000   TIBIA IM NAIL INSERTION Left 03/12/2021   Procedure: INTRAMEDULLARY (IM) NAIL TIBIAL;  Surgeon: Kerrin Champagne, MD;  Location: [facility];  Service: Orthopedics;  Laterality: Left;   TONSILLECTOMY     UPPER ESOPHAGEAL ENDOSCOPIC ULTRASOUND (EUS) N/A 08/13/2018   Procedure: UPPER ESOPHAGEAL ENDOSCOPIC ULTRASOUND (EUS);  Surgeon: Lemar Lofty., MD;  Location: [facility]COPY;  Service: Gastroenterology;  Laterality: N/A;   UPPER ESOPHAGEAL ENDOSCOPIC ULTRASOUND (EUS) N/A 01/19/2019   Procedure: UPPER ESOPHAGEAL ENDOSCOPIC ULTRASOUND (EUS);  Surgeon: Lemar Lofty., MD;  Location: [facility] ENDOSCOPY;  Service: Gastroenterology;  Laterality: N/A;   Social History   Occupational History   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

## 2021-06-16 ENCOUNTER — Ambulatory Visit
Admission: RE | Admit: 2021-06-16 | Discharge: 2021-06-16 | Disposition: A | Discharge status: Home / Self Care | Payer: 59 | Source: Ambulatory Visit | Attending: Specialist | Admitting: Specialist

## 2021-06-16 DIAGNOSIS — M79605 Pain in left leg: Secondary | ICD-10-CM

## 2021-06-16 DIAGNOSIS — M79661 Pain in right lower leg: Secondary | ICD-10-CM

## 2021-06-16 DIAGNOSIS — S82891D Other fracture of right lower leg, subsequent encounter for closed fracture with routine healing: Secondary | ICD-10-CM

## 2021-06-16 DIAGNOSIS — M25572 Pain in left ankle and joints of left foot: Secondary | ICD-10-CM

## 2021-06-19 ENCOUNTER — Other Ambulatory Visit: Payer: Self-pay | Admitting: Specialist

## 2021-06-20 ENCOUNTER — Other Ambulatory Visit: Payer: Self-pay

## 2021-06-20 ENCOUNTER — Ambulatory Visit (INDEPENDENT_AMBULATORY_CARE_PROVIDER_SITE_OTHER): Payer: 59 | Admitting: Rehabilitative and Restorative Service Providers"

## 2021-06-20 ENCOUNTER — Encounter: Payer: Self-pay | Admitting: Rehabilitative and Restorative Service Providers"

## 2021-06-20 ENCOUNTER — Encounter: Payer: 59 | Admitting: Rehabilitative and Restorative Service Providers"

## 2021-06-20 DIAGNOSIS — R600 Localized edema: Secondary | ICD-10-CM

## 2021-06-20 DIAGNOSIS — M25672 Stiffness of left ankle, not elsewhere classified: Secondary | ICD-10-CM | POA: Diagnosis not present

## 2021-06-20 DIAGNOSIS — R262 Difficulty in walking, not elsewhere classified: Secondary | ICD-10-CM

## 2021-06-20 DIAGNOSIS — R2681 Unsteadiness on feet: Secondary | ICD-10-CM

## 2021-06-20 DIAGNOSIS — M6281 Muscle weakness (generalized): Secondary | ICD-10-CM | POA: Diagnosis not present

## 2021-06-20 DIAGNOSIS — M25572 Pain in left ankle and joints of left foot: Secondary | ICD-10-CM | POA: Diagnosis not present

## 2021-06-20 NOTE — Therapy (Signed)
[facility] [address] [place], [place], [address] Phone: [phone]   Fax:  [phone]  Physical Therapy Treatment  Patient Details  Name: Mallory Stewart MRN: [id number] Date of Birth: 1960-08-11 Referring Provider (PT): Jessy Oto, MD   Encounter Date: 06/20/2021   PT End of Session - 06/20/21 1001     Visit Number 3    Number of Visits 8    Date for PT Re-Evaluation 07/04/21    Authorization Type [employer] $150 copay after deductible met    Authorization - Visit Number 3    PT Start Time 0933    PT Stop Time 4431    PT Time Calculation (min) 50 min    Activity Tolerance Patient tolerated treatment well    Behavior During Therapy [facility] for tasks assessed/performed             Past Medical History:  Diagnosis Date   Alcohol-induced chronic pancreatitis ([place])    Alcoholic cirrhosis ([place])    B12 deficiency    Concussion    DDD (degenerative disc disease), cervical    Diabetes ([place])    DKA, type 2 ([place])    Gastric outlet obstruction 08/12/2018   Hypertension    Neuropathy    Pancreatic pseudocyst/cyst 05/06/2013   Seasonal allergies     Past Surgical History:  Procedure Laterality Date   ANKLE ARTHROSCOPY Right 12/20/2020   Procedure: RIGHT ANKLE ARTHROSCOPIC DEBRIDEMENT;  Surgeon: Newt Minion, MD;  Location: [place];  Service: Orthopedics;  Laterality: Right;   BIOPSY  01/19/2019   Procedure: BIOPSY;  Surgeon: Rush Landmark Telford Nab., MD;  Location: [place];  Service: Gastroenterology;;   CHOLECYSTECTOMY N/A 07/28/2016   Procedure: LAPAROSCOPIC CHOLECYSTECTOMY WITH INTRAOPERATIVE CHOLANGIOGRAM;  Surgeon: Mickeal Skinner, MD;  Location: [place];  Service: General;  Laterality: N/A;   ESOPHAGOGASTRODUODENOSCOPY (EGD) WITH PROPOFOL N/A 08/13/2018   Procedure: ESOPHAGOGASTRODUODENOSCOPY (EGD) WITH PROPOFOL;  Surgeon: Irving Copas., MD;  Location: [place];  Service: Gastroenterology;   Laterality: N/A;   ESOPHAGOGASTRODUODENOSCOPY (EGD) WITH PROPOFOL N/A 01/19/2019   Procedure: ESOPHAGOGASTRODUODENOSCOPY (EGD) WITH PROPOFOL;  Surgeon: Rush Landmark Telford Nab., MD;  Location: [place];  Service: Gastroenterology;  Laterality: N/A;   IR PARACENTESIS  08/08/2020   IR PARACENTESIS  03/13/2021   LEEP  1990's   ORIF FOOT FRACTURE  09/2008   L 5th metatarsal   REFRACTIVE SURGERY  2000   TIBIA IM NAIL INSERTION Left 03/12/2021   Procedure: INTRAMEDULLARY (IM) NAIL TIBIAL;  Surgeon: Jessy Oto, MD;  Location: [place];  Service: Orthopedics;  Laterality: Left;   TONSILLECTOMY     UPPER ESOPHAGEAL ENDOSCOPIC ULTRASOUND (EUS) N/A 08/13/2018   Procedure: UPPER ESOPHAGEAL ENDOSCOPIC ULTRASOUND (EUS);  Surgeon: Irving Copas., MD;  Location: [place];  Service: Gastroenterology;  Laterality: N/A;   UPPER ESOPHAGEAL ENDOSCOPIC ULTRASOUND (EUS) N/A 01/19/2019   Procedure: UPPER ESOPHAGEAL ENDOSCOPIC ULTRASOUND (EUS);  Surgeon: Irving Copas., MD;  Location: [place];  Service: Gastroenterology;  Laterality: N/A;    There were no vitals filed for this visit.   Subjective Assessment - 06/20/21 0938     Subjective Pt. indicated 5/10 pain.  Pt. indicated she saw PA recently and had CT scan to investigate foot more.  Pt. indicated she was given walker boot to wear and had been walking with FWW until seeing PA visit.    Currently in Pain? Yes    Pain Score 5     Pain Location Ankle  Pain Descriptors / Indicators Aching;Sore;Tightness    Pain Type Chronic pain;Surgical pain    Pain Onset More than a month ago    Pain Frequency Constant    Aggravating Factors  general tightness, various during day.    Pain Relieving Factors nothing specific reported.                [facility] PT Assessment - 06/20/21 0001       Assessment   Medical Diagnosis M79.605 (ICD-10-CM) - Pain of left lower extremity due to injury    Referring Provider (PT) Jessy Oto, MD     Onset Date/Surgical Date 03/12/21      Restrictions   LLE Weight Bearing Partial weight bearing    LLE Partial Weight Bearing Percentage or Pounds 25%    Other Position/Activity Restrictions walker boot      AROM   Left Ankle Dorsiflexion -5   in 90 knee flexion, -10 in  0 deg knee extension   Left Ankle Plantar Flexion 45    Left Ankle Inversion 25    Left Ankle Eversion 15      Strength   Left Ankle Dorsiflexion 5/5    Left Ankle Plantar Flexion 2+/5    Left Ankle Inversion 5/5    Left Ankle Eversion 4/5   c pain                          [facility] Adult PT Treatment/Exercise - 06/20/21 0001       Vasopneumatic   Number Minutes Vasopneumatic  10 minutes    Vasopnuematic Location  Ankle   lt   Vasopneumatic Pressure Medium    Vasopneumatic Temperature  34      Manual Therapy   Manual therapy comments g3 ap mobs talocrural joint, prom stretch into DF      Ankle Exercises: Stretches   Gastroc Stretch 30 seconds;5 reps   long sitting c strap     Ankle Exercises: Seated   Other Seated Ankle Exercises isometric painfree 10 sec on/5 sec off 8x in DF, Inversion, eversion                       PT Short Term Goals - 06/20/21 0957       PT SHORT TERM GOAL #1   Title Patient will demonstrate independent use of home exercise program to maintain progress from in clinic treatments.    Time 4    Period Weeks    Status Achieved    Target Date 06/06/21               PT Long Term Goals - 06/20/21 0958       PT LONG TERM GOAL #1   Title Patient will demonstrate/report pain at worst less than or equal to 2/10 to facilitate minimal limitation in daily activity secondary to pain symptoms.    Time 8    Period Weeks    Status On-going    Target Date 07/04/21      PT LONG TERM GOAL #2   Title Patient will demonstrate independent use of home exercise program to facilitate ability to maintain/progress functional gains from skilled physical therapy  services.    Time 8    Period Weeks    Status On-going    Target Date 07/04/21      PT LONG TERM GOAL #3   Title Pt. will demonstrate FOTO outcome > or = 59  to indicated reduced disability due to condition.    Time 8    Period Weeks    Status On-going    Target Date 07/04/21      PT LONG TERM GOAL #4   Title Pt. will demonstrate modified independent community ambulation > 300 ft s deviation with SPC for improved mobility and decreased fall risk    Time 8    Period Weeks    Status On-going    Target Date 07/04/21      PT LONG TERM GOAL #5   Title Pt. will demonstrate Lt ankle df to at least neutral, inversion/eversion > 25 degrees to facilitate usual mobility for daily walking, standing, transfers at PLOF.    Time 8    Period Weeks    Status On-going    Target Date 07/04/21      PT LONG TERM GOAL #6   Title Pt. will demonstrate Lt ankle MMT 4/5 throughout to facilitate usual daily and recreational activity at PLOF.    Time 8    Period Weeks    Status On-going    Target Date 07/04/21      PT LONG TERM GOAL #7   Title --                   Plan - 06/20/21 1003     Clinical Impression Statement At this time, review of chart and Pt. report was questionable for current WB expectations.  Pt. was instructed to continue boot use (per PA visit) and will plan to report back after next MD visit.  WB at or less than 25% was last update for physical therapy but treated Pt. today as NWB until MD visit.  Overall Lt ankle pain with mobility and strength deficits still noted at this time.  Pt. reported again that financial concern for visit cost would limit frequency.    Personal Factors and Comorbidities Comorbidity 3+    Comorbidities DDD cervical, HTN, extensive vestibular complication history, Rt ankle fx    Examination-Activity Limitations Bend;Squat;Stairs;Carry;Stand;Transfers;Dressing;Locomotion Level    Examination-Participation Restrictions Cleaning;Community  Activity;Shop;Driving;Yard Work;Laundry;Occupation    Stability/Clinical Decision Making Evolving/Moderate complexity    Rehab Potential Good    PT Frequency 1x / week   set to 1x/wk due to insurance/financial obligation   PT Duration 8 weeks    PT Treatment/Interventions ADLs/Self Care Home Management;Cryotherapy;Electrical Stimulation;Iontophoresis 89m/ml Dexamethasone;Moist Heat;Balance training;Therapeutic exercise;Therapeutic activities;Functional mobility training;Stair training;Gait training;DME Instruction;Ultrasound;Neuromuscular re-education;Patient/family education;Passive range of motion;Taping;Vasopneumatic Device;Manual techniques;Canalith Repostioning;Vestibular    PT Next Visit Plan Recheck WB status after MD visit.    PT Home Exercise Plan [email or web address]    Consulted and Agree with Plan of Care Patient             Patient will benefit from skilled therapeutic intervention in order to improve the following deficits and impairments:  Abnormal gait, Decreased endurance, Hypomobility, Increased edema, Decreased activity tolerance, Decreased strength, Pain, Decreased balance, Decreased mobility, Difficulty walking, Impaired perceived functional ability, Improper body mechanics, Impaired flexibility, Decreased coordination, Decreased range of motion, Dizziness, Impaired sensation  Visit Diagnosis: Pain in left ankle and joints of left foot  Muscle weakness (generalized)  Stiffness of left ankle, not elsewhere classified  Difficulty in walking, not elsewhere classified  Localized edema  Unsteadiness on feet     Problem List Patient Active Problem List   Diagnosis Date Noted   Hypocalcemia 03/24/2021   Type 2 diabetes mellitus with hyperglycemia, with long-term current use of insulin ([place] 03/24/2021  Secondary esophageal varices without bleeding (HCC)    Closed fracture of distal end of left tibia, initial encounter 03/12/2021    Class: Acute   Closed fracture of  proximal fibula 03/12/2021    Class: Acute   Displaced spiral fracture of shaft of left tibia, initial encounter for closed fracture 03/12/2021   Impingement of right ankle joint    Gastric varices without bleeding 12/06/2020   Portal hypertension ([place]) 09/30/2020   Dilated bile duct 06/98/6148   Alcoholic cirrhosis of liver with ascites ([place]) 09/29/2020   Ascites 09/17/2020   Right foot pain 09/17/2020   Laceration of lesser toe of right foot without foreign body present or damage to nail 09/17/2020   Acute right ankle pain 09/17/2020   Frequent falls 09/14/2020   Closed fracture of ankle 09/14/2020   Foot sprain, right, subsequent encounter 08/26/2020   Sprain of anterior talofibular ligament of right ankle 08/26/2020   Splenomegaly 08/09/2020   Malnutrition of moderate degree ([place]) 08/08/2020   Transaminitis 08/06/2020   Serum total bilirubin elevated 08/06/2020   Fall at home, initial encounter 08/06/2020   Generalized weakness 08/05/2020   Idiopathic peripheral neuropathy 07/13/2020   Type 2 diabetes mellitus with diabetic polyneuropathy, with long-term current use of insulin ([place]) 07/13/2020   Persistent cough for 3 weeks or longer 07/04/2020   Low HDL (under 40) 06/21/2020   Post-nasal drip 06/21/2020   Allergic rhinitis 06/21/2020   Elevated alkaline phosphatase level 06/21/2020   Duodenitis - edema ? andgioedema 10/30/2018   Chronic alcoholic pancreatitis ([place]) 09/02/2018   Diabetes mellitus with circulatory complication, HTN ([place]) 30/73/5430   Dizziness 12/26/2017   Chronic pain 05/17/2017   Osteoarthritis of spine with radiculopathy, cervical region 09/17/2016   B12 deficiency 10/21/2014   Neuropathic pain of both legs 02/11/2014   Hepatic steatosis 04/27/2013   GAD (generalized anxiety disorder) 09/23/2009   Hyperlipidemia associated with type 2 diabetes mellitus ([place]) 11/03/2008   Hypertension associated with diabetes ([place]) 09/29/2008   PAP SMEAR, ABNORMAL  09/29/2008    Scot Jun, PT, DPT, OCS, ATC 06/20/21  10:14 AM    [facility] [address] [place], [place], [address] Phone: [phone]   Fax:  [phone]  Name: Mallory Stewart MRN: [id number] Date of Birth: 05/09/1960

## 2021-06-26 ENCOUNTER — Other Ambulatory Visit: Payer: Self-pay | Admitting: Internal Medicine

## 2021-06-29 ENCOUNTER — Other Ambulatory Visit: Payer: Self-pay

## 2021-06-29 ENCOUNTER — Encounter: Payer: Self-pay | Admitting: Specialist

## 2021-06-29 ENCOUNTER — Telehealth: Payer: Self-pay | Admitting: Pharmacy Technician

## 2021-06-29 ENCOUNTER — Ambulatory Visit: Payer: 59 | Admitting: Specialist

## 2021-06-29 ENCOUNTER — Telehealth: Payer: Self-pay

## 2021-06-29 ENCOUNTER — Ambulatory Visit: Payer: Self-pay

## 2021-06-29 ENCOUNTER — Other Ambulatory Visit (HOSPITAL_COMMUNITY): Payer: Self-pay

## 2021-06-29 VITALS — BP 97/62 | HR 76 | Ht 69.0 in | Wt 150.0 lb

## 2021-06-29 DIAGNOSIS — S82242A Displaced spiral fracture of shaft of left tibia, initial encounter for closed fracture: Secondary | ICD-10-CM

## 2021-06-29 DIAGNOSIS — S82202G Unspecified fracture of shaft of left tibia, subsequent encounter for closed fracture with delayed healing: Secondary | ICD-10-CM

## 2021-06-29 MED ORDER — OXYCODONE HCL 5 MG PO TABS: 5.0000 mg | ORAL_TABLET | Freq: Two times a day (BID) | ORAL | 0 refills | Status: DC

## 2021-06-29 NOTE — Telephone Encounter (Signed)
[employer] needs a PA

## 2021-06-29 NOTE — Telephone Encounter (Signed)
Vm left for patient stating that we have sent message to PA team to process [employer].

## 2021-06-29 NOTE — Telephone Encounter (Signed)
Patient Advocate Encounter  Received notification from [employer] that prior authorization for [employer]OSTAR is required.   PA submitted on 2.23.23 Key [id number] Status is pending   [facility] will continue to follow  Luciano Cutter, CPhT Patient Advocate [facility] Phone: [phone] Fax:  [phone]

## 2021-06-29 NOTE — Patient Instructions (Signed)
Plan: Increase weight-bearing left leg using boot part time 1/2 days. Take Vitamin 2000 IU daily, Calcium supplements as you are taking. Work on stretching left  and right heel cords. Pain meds as you are taking is not unreasonable.

## 2021-06-29 NOTE — Progress Notes (Signed)
Office Visit Note   Patient: Mallory Stewart           Date of Birth: 10/13/1960           MRN: [id number] Visit Date: 06/29/2021              Requested by: Excell Seltzer, MD [address] [place],  [place] [address] PCP: Excell Seltzer, MD   Assessment & Plan: Visit Diagnoses:  1. Displaced spiral fracture of shaft of left tibia, initial encounter for closed fracture   2. Delayed union of closed fracture of shaft of tibia, left     Plan: Increase weight-bearing left leg using boot part time 1/2 days. Take Vitamin 2000 IU daily, Calcium supplements as you are taking. Work on stretching left  and right heel cords. Pain meds as you are taking is not unreasonable.   Follow-Up Instructions: No follow-ups on file.   Orders:  Orders Placed This Encounter  Procedures   XR Tibia/Fibula Left   No orders of the defined types were placed in this encounter.     Procedures: No procedures performed   Clinical Data: No additional findings.   Subjective: Chief Complaint  Patient presents with   Left Leg - Follow-up    CT Scan review    HPI  Review of Systems   Objective: Vital Signs: BP 97/62 (BP Location: Left Arm, Patient Position: Sitting)   Pulse 76   Ht 5\' 9"  (1.753 m)   Wt 150 lb (68 kg)   BMI 22.15 kg/m   Physical Exam  Ortho Exam  Specialty Comments:  No specialty comments available.  Imaging: No results found.   PMFS History: Patient Active Problem List   Diagnosis Date Noted   Closed fracture of distal end of left tibia, initial encounter 03/12/2021    Priority: High    Class: Acute   Closed fracture of proximal fibula 03/12/2021    Priority: Medium     Class: Acute   Hypocalcemia 03/24/2021   Type 2 diabetes mellitus with hyperglycemia, with long-term current use of insulin (HCC) 03/24/2021   Secondary esophageal varices without bleeding (HCC)    Displaced spiral fracture of shaft of left tibia, initial encounter for closed  fracture 03/12/2021   Impingement of right ankle joint    Gastric varices without bleeding 12/06/2020   Portal hypertension (HCC) 09/30/2020   Dilated bile duct 09/30/2020   Alcoholic cirrhosis of liver with ascites (HCC) 09/29/2020   Ascites 09/17/2020   Right foot pain 09/17/2020   Laceration of lesser toe of right foot without foreign body present or damage to nail 09/17/2020   Acute right ankle pain 09/17/2020   Frequent falls 09/14/2020   Closed fracture of ankle 09/14/2020   Foot sprain, right, subsequent encounter 08/26/2020   Sprain of anterior talofibular ligament of right ankle 08/26/2020   Splenomegaly 08/09/2020   Malnutrition of moderate degree (HCC) 08/08/2020   Transaminitis 08/06/2020   Serum total bilirubin elevated 08/06/2020   Fall at home, initial encounter 08/06/2020   Generalized weakness 08/05/2020   Idiopathic peripheral neuropathy 07/13/2020   Type 2 diabetes mellitus with diabetic polyneuropathy, with long-term current use of insulin (HCC) 07/13/2020   Persistent cough for 3 weeks or longer 07/04/2020   Low HDL (under 40) 06/21/2020   Post-nasal drip 06/21/2020   Allergic rhinitis 06/21/2020   Elevated alkaline phosphatase level 06/21/2020   Duodenitis - edema ? andgioedema 10/30/2018   Chronic alcoholic pancreatitis (HCC) 09/02/2018  Diabetes mellitus with circulatory complication, HTN (HCC) 08/22/2018   Dizziness 12/26/2017   Chronic pain 05/17/2017   Osteoarthritis of spine with radiculopathy, cervical region 09/17/2016   B12 deficiency 10/21/2014   Neuropathic pain of both legs 02/11/2014   Hepatic steatosis 04/27/2013   GAD (generalized anxiety disorder) 09/23/2009   Hyperlipidemia associated with type 2 diabetes mellitus (HCC) 11/03/2008   Hypertension associated with diabetes (HCC) 09/29/2008   PAP SMEAR, ABNORMAL 09/29/2008   Past Medical History:  Diagnosis Date   Alcohol-induced chronic pancreatitis (HCC)    Alcoholic cirrhosis (HCC)     B12 deficiency    Concussion    DDD (degenerative disc disease), cervical    Diabetes (HCC)    DKA, type 2 (HCC)    Gastric outlet obstruction 08/12/2018   Hypertension    Neuropathy    Pancreatic pseudocyst/cyst 05/06/2013   Seasonal allergies     Family History  Problem Relation Age of Onset   Other Mother        tachycardia.Marland KitchenMarland Kitchen?afib   Stroke Father        after hernia suegery   Prostate cancer Father    Other Father        global transient amnesia, unclear source   Atrial fibrillation Sister    Healthy Brother    Healthy Brother    Coronary artery disease Paternal Grandmother    Heart attack Paternal Grandmother 74   Brain cancer Maternal Grandfather        ?   Cancer Paternal Grandfather        ?   Breast cancer Maternal Grandmother     Past Surgical History:  Procedure Laterality Date   ANKLE ARTHROSCOPY Right 12/20/2020   Procedure: RIGHT ANKLE ARTHROSCOPIC DEBRIDEMENT;  Surgeon: Nadara Mustard, MD;  Location: [facility];  Service: Orthopedics;  Laterality: Right;   BIOPSY  01/19/2019   Procedure: BIOPSY;  Surgeon: Meridee Score Netty Starring., MD;  Location[facility];  Service: Gastroenterology;;   CHOLECYSTECTOMY N/A 07/28/2016   Procedure: LAPAROSCOPIC CHOLECYSTECTOMY WITH INTRAOPERATIVE CHOLANGIOGRAM;  Surgeon: Rodman Pickle, MD;  Location: [facility] OR;  Service: General;  Laterality: N/A;   ESOPHAGOGASTRODUODENOSCOPY (EGD) WITH PROPOFOL N/A 08/13/2018   Procedure: ESOPHAGOGASTRODUODENOSCOPY (EGD) WITH PROPOFOL;  Surgeon: Lemar Lofty., MD;  Location: [facility] ENDOSCOPY;  Service: Gastroenterology;  Laterality: N/A;   ESOPHAGOGASTRODUODENOSCOPY (EGD) WITH PROPOFOL N/A 01/19/2019   Procedure: ESOPHAGOGASTRODUODENOSCOPY (EGD) WITH PROPOFOL;  Surgeon: Meridee Score Netty Starring., MD;  Location: [facility] ENDOSCOPY;  Service: Gastroenterology;  Laterality: N/A;   IR PARACENTESIS  08/08/2020   IR PARACENTESIS  03/13/2021   LEEP  1990's   ORIF FOOT FRACTURE  09/2008    L 5th metatarsal   REFRACTIVE SURGERY  2000   TIBIA IM NAIL INSERTION Left 03/12/2021   Procedure: INTRAMEDULLARY (IM) NAIL TIBIAL;  Surgeon: Kerrin Champagne, MD;  Location: [facility] OR;  Service: Orthopedics;  Laterality: Left;   TONSILLECTOMY     UPPER ESOPHAGEAL ENDOSCOPIC ULTRASOUND (EUS) N/A 08/13/2018   Procedure: UPPER ESOPHAGEAL ENDOSCOPIC ULTRASOUND (EUS);  Surgeon: Lemar Lofty., MD;  Location: [facility] ENDOSCOPY;  Service: Gastroenterology;  Laterality: N/A;   UPPER ESOPHAGEAL ENDOSCOPIC ULTRASOUND (EUS) N/A 01/19/2019   Procedure: UPPER ESOPHAGEAL ENDOSCOPIC ULTRASOUND (EUS);  Surgeon: Lemar Lofty., MD;  Location: [facility] ENDOSCOPY;  Service: Gastroenterology;  Laterality: N/A;   Social History   Occupational History   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

## 2021-06-29 NOTE — Telephone Encounter (Signed)
PA has been submitted.

## 2021-07-01 ENCOUNTER — Other Ambulatory Visit: Payer: Self-pay | Admitting: Family Medicine

## 2021-07-03 ENCOUNTER — Other Ambulatory Visit: Payer: Self-pay

## 2021-07-03 ENCOUNTER — Ambulatory Visit (INDEPENDENT_AMBULATORY_CARE_PROVIDER_SITE_OTHER): Payer: 59 | Admitting: Physical Therapy

## 2021-07-03 ENCOUNTER — Encounter: Payer: Self-pay | Admitting: Physical Therapy

## 2021-07-03 DIAGNOSIS — M25672 Stiffness of left ankle, not elsewhere classified: Secondary | ICD-10-CM | POA: Diagnosis not present

## 2021-07-03 DIAGNOSIS — M25572 Pain in left ankle and joints of left foot: Secondary | ICD-10-CM | POA: Diagnosis not present

## 2021-07-03 DIAGNOSIS — R262 Difficulty in walking, not elsewhere classified: Secondary | ICD-10-CM

## 2021-07-03 DIAGNOSIS — M6281 Muscle weakness (generalized): Secondary | ICD-10-CM | POA: Diagnosis not present

## 2021-07-03 DIAGNOSIS — R2681 Unsteadiness on feet: Secondary | ICD-10-CM

## 2021-07-03 DIAGNOSIS — R600 Localized edema: Secondary | ICD-10-CM

## 2021-07-03 NOTE — Therapy (Signed)
[facility] [address] [place], [place], [address] Phone: [phone]   Fax:  [phone]  Physical Therapy Treatment/Recertification  Patient Details  Name: Mallory Stewart MRN: [id number] Date of Birth: 08-21-1960 Referring Provider (PT): Kerrin Champagne, MD   Encounter Date: 07/03/2021   PT End of Session - 07/03/21 1144     Visit Number 4    Number of Visits 8    Date for PT Re-Evaluation 08/28/21    Authorization Type [employer] $150 copay after deductible met    Authorization - Visit Number 4    PT Start Time 1145    PT Stop Time 1225    PT Time Calculation (min) 40 min    Activity Tolerance Patient tolerated treatment well    Behavior During Therapy [facility] for tasks assessed/performed             Past Medical History:  Diagnosis Date   Alcohol-induced chronic pancreatitis (HCC)    Alcoholic cirrhosis (HCC)    B12 deficiency    Concussion    DDD (degenerative disc disease), cervical    Diabetes (HCC)    DKA, type 2 (HCC)    Gastric outlet obstruction 08/12/2018   Hypertension    Neuropathy    Pancreatic pseudocyst/cyst 05/06/2013   Seasonal allergies     Past Surgical History:  Procedure Laterality Date   ANKLE ARTHROSCOPY Right 12/20/2020   Procedure: RIGHT ANKLE ARTHROSCOPIC DEBRIDEMENT;  Surgeon: Nadara Mustard, MD;  Location: [facility];  Service: Orthopedics;  Laterality: Right;   BIOPSY  01/19/2019   Procedure: BIOPSY;  Surgeon: Meridee Score Netty Starring., MD;  Location: [facility] ENDOSCOPY;  Service: Gastroenterology;;   CHOLECYSTECTOMY N/A 07/28/2016   Procedure: LAPAROSCOPIC CHOLECYSTECTOMY WITH INTRAOPERATIVE CHOLANGIOGRAM;  Surgeon: Rodman Pickle, MD;  Location: [facility] OR;  Service: General;  Laterality: N/A;   ESOPHAGOGASTRODUODENOSCOPY (EGD) WITH PROPOFOL N/A 08/13/2018   Procedure: ESOPHAGOGASTRODUODENOSCOPY (EGD) WITH PROPOFOL;  Surgeon: Lemar Lofty., MD;  Location: [facility] ENDOSCOPY;  Service:  Gastroenterology;  Laterality: N/A;   ESOPHAGOGASTRODUODENOSCOPY (EGD) WITH PROPOFOL N/A 01/19/2019   Procedure: ESOPHAGOGASTRODUODENOSCOPY (EGD) WITH PROPOFOL;  Surgeon: Meridee Score Netty Starring., MD;  Location: [facility] ENDOSCOPY;  Service: Gastroenterology;  Laterality: N/A;   IR PARACENTESIS  08/08/2020   IR PARACENTESIS  03/13/2021   LEEP  1990's   ORIF FOOT FRACTURE  09/2008   L 5th metatarsal   REFRACTIVE SURGERY  2000   TIBIA IM NAIL INSERTION Left 03/12/2021   Procedure: INTRAMEDULLARY (IM) NAIL TIBIAL;  Surgeon: Kerrin Champagne, MD;  Location: [facility] OR;  Service: Orthopedics;  Laterality: Left;   TONSILLECTOMY     UPPER ESOPHAGEAL ENDOSCOPIC ULTRASOUND (EUS) N/A 08/13/2018   Procedure: UPPER ESOPHAGEAL ENDOSCOPIC ULTRASOUND (EUS);  Surgeon: Lemar Lofty., MD;  Location: [facility] ENDOSCOPY;  Service: Gastroenterology;  Laterality: N/A;   UPPER ESOPHAGEAL ENDOSCOPIC ULTRASOUND (EUS) N/A 01/19/2019   Procedure: UPPER ESOPHAGEAL ENDOSCOPIC ULTRASOUND (EUS);  Surgeon: Lemar Lofty., MD;  Location: [facility] ENDOSCOPY;  Service: Gastroenterology;  Laterality: N/A;    There were no vitals filed for this visit.   Subjective Assessment - 07/03/21 1145     Subjective able to increase weight bearing to 50%.  walking "some" at home.  CT was clear so trying to do a little bit more walking    Pertinent History DDD cervical, HTN, h/o left ankle sprain, closed fx of distal end of left tibia, closed fx of promimal fibula    Patient Stated Goals pain free independent mobility  Currently in Pain? No/denies                [facility] PT Assessment - 07/03/21 1156       Assessment   Medical Diagnosis M79.605 (ICD-10-CM) - Pain of left lower extremity due to injury    Referring Provider (PT) Jessy Oto, MD      Restrictions   LLE Weight Bearing Partial weight bearing    LLE Partial Weight Bearing Percentage or Pounds 50%    Other Position/Activity Restrictions walker boot      Observation/Other  Assessments   Focus on Therapeutic Outcomes (FOTO)  43 (predicted 59)      AROM   Left Ankle Dorsiflexion -9   in 0 deg knee extension   Left Ankle Plantar Flexion 45    Left Ankle Inversion 20    Left Ankle Eversion 10      Strength   Left Ankle Dorsiflexion 5/5    Left Ankle Plantar Flexion 2+/5    Left Ankle Inversion 5/5    Left Ankle Eversion 4/5   with pain                          [facility] Adult PT Treatment/Exercise - 07/03/21 1208       Ambulation/Gait   Ambulation/Gait Yes    Ambulation/Gait Assistance 5: Supervision    Ambulation/Gait Assistance Details cues for sequencing and 50% weight bearing    Ambulation Distance (Feet) 100 Feet    Assistive device Rolling walker    Gait Pattern Step-to pattern      Self-Care   Self-Care Other Self-Care Comments    Other Self-Care Comments  discussed current progress and POC- agree to hold PT x 3 weeks due to current progress and weight bearing restrictions      Exercises   Exercises Ankle      Ankle Exercises: Standing   Other Standing Ankle Exercises weight shifting for 50% weight bearing x 15 reps    Other Standing Ankle Exercises mini squat x 10 reps                       PT Short Term Goals - 06/20/21 0957       PT SHORT TERM GOAL #1   Title Patient will demonstrate independent use of home exercise program to maintain progress from in clinic treatments.    Time 4    Period Weeks    Status Achieved    Target Date 06/06/21               PT Long Term Goals - 07/03/21 1249       PT LONG TERM GOAL #1   Title Patient will demonstrate/report pain at worst less than or equal to 2/10 to facilitate minimal limitation in daily activity secondary to pain symptoms.    Time 8    Period Weeks    Status On-going    Target Date 08/28/21      PT LONG TERM GOAL #2   Title Patient will demonstrate independent use of home exercise program to facilitate ability to maintain/progress  functional gains from skilled physical therapy services.    Time 8    Period Weeks    Status On-going    Target Date 08/28/21      PT LONG TERM GOAL #3   Title Pt. will demonstrate FOTO outcome > or = 59 to indicated reduced disability due to condition.  Time 8    Period Weeks    Status On-going    Target Date 08/28/21      PT LONG TERM GOAL #4   Title Pt. will demonstrate modified independent community ambulation > 300 ft s deviation with SPC for improved mobility and decreased fall risk    Time 8    Period Weeks    Status On-going    Target Date 08/28/21      PT LONG TERM GOAL #5   Title Pt. will demonstrate Lt ankle df to at least neutral, inversion/eversion > 25 degrees to facilitate usual mobility for daily walking, standing, transfers at PLOF.    Time 8    Period Weeks    Status On-going    Target Date 08/28/21      PT LONG TERM GOAL #6   Title Pt. will demonstrate Lt ankle MMT 4/5 throughout to facilitate usual daily and recreational activity at PLOF.    Time 8    Period Weeks    Status On-going    Target Date 08/28/21                   Plan - 07/03/21 1250     Clinical Impression Statement Pt has demonstrated steady progress with PT within her limited weightbearing status but all LTGs are still ongoing at this time due to WB limitations.  At this time plan to continue PT x 8 weeks, but will see up to 1 x/wk, planning to see again in 3 weeks due to limitations.  Will continue to benefit from PT to maximize function.    Personal Factors and Comorbidities Comorbidity 3+    Comorbidities DDD cervical, HTN, extensive vestibular complication history, Rt ankle fx    Examination-Activity Limitations Bend;Squat;Stairs;Carry;Stand;Transfers;Dressing;Locomotion Level    Examination-Participation Restrictions Cleaning;Community Activity;Shop;Driving;Yard Work;Laundry;Occupation    Stability/Clinical Decision Making Evolving/Moderate complexity    Rehab Potential  Good    PT Frequency 1x / week   set to 1x/wk due to insurance/financial obligation   PT Duration 8 weeks    PT Treatment/Interventions ADLs/Self Care Home Management;Cryotherapy;Electrical Stimulation;Iontophoresis 4mg /ml Dexamethasone;Moist Heat;Balance training;Therapeutic exercise;Therapeutic activities;Functional mobility training;Stair training;Gait training;DME Instruction;Ultrasound;Neuromuscular re-education;Patient/family education;Passive range of motion;Taping;Vasopneumatic Device;Manual techniques;Canalith Repostioning;Vestibular    PT Next Visit Plan continue 50% WB, review HEP    PT Home Exercise Plan [email or web address]    Consulted and Agree with Plan of Care Patient             Patient will benefit from skilled therapeutic intervention in order to improve the following deficits and impairments:  Abnormal gait, Decreased endurance, Hypomobility, Increased edema, Decreased activity tolerance, Decreased strength, Pain, Decreased balance, Decreased mobility, Difficulty walking, Impaired perceived functional ability, Improper body mechanics, Impaired flexibility, Decreased coordination, Decreased range of motion, Dizziness, Impaired sensation  Visit Diagnosis: Pain in left ankle and joints of left foot  Muscle weakness (generalized)  Stiffness of left ankle, not elsewhere classified  Difficulty in walking, not elsewhere classified  Localized edema  Unsteadiness on feet     Problem List Patient Active Problem List   Diagnosis Date Noted   Hypocalcemia 03/24/2021   Type 2 diabetes mellitus with hyperglycemia, with long-term current use of insulin ([place]) 03/24/2021   Secondary esophageal varices without bleeding ([place])    Closed fracture of distal end of left tibia, initial encounter 03/12/2021    Class: Acute   Closed fracture of proximal fibula 03/12/2021    Class: Acute   Displaced spiral fracture of shaft of left tibia,  initial encounter for closed fracture 03/12/2021    Impingement of right ankle joint    Gastric varices without bleeding 12/06/2020   Portal hypertension ([place]) 09/30/2020   Dilated bile duct 24/01/7352   Alcoholic cirrhosis of liver with ascites ([place]) 09/29/2020   Ascites 09/17/2020   Right foot pain 09/17/2020   Laceration of lesser toe of right foot without foreign body present or damage to nail 09/17/2020   Acute right ankle pain 09/17/2020   Frequent falls 09/14/2020   Closed fracture of ankle 09/14/2020   Foot sprain, right, subsequent encounter 08/26/2020   Sprain of anterior talofibular ligament of right ankle 08/26/2020   Splenomegaly 08/09/2020   Malnutrition of moderate degree ([place]) 08/08/2020   Transaminitis 08/06/2020   Serum total bilirubin elevated 08/06/2020   Fall at home, initial encounter 08/06/2020   Generalized weakness 08/05/2020   Idiopathic peripheral neuropathy 07/13/2020   Type 2 diabetes mellitus with diabetic polyneuropathy, with long-term current use of insulin ([place]) 07/13/2020   Persistent cough for 3 weeks or longer 07/04/2020   Low HDL (under 40) 06/21/2020   Post-nasal drip 06/21/2020   Allergic rhinitis 06/21/2020   Elevated alkaline phosphatase level 06/21/2020   Duodenitis - edema ? andgioedema 10/30/2018   Chronic alcoholic pancreatitis ([place]) 09/02/2018   Diabetes mellitus with circulatory complication, HTN ([place]) 29/92/4268   Dizziness 12/26/2017   Chronic pain 05/17/2017   Osteoarthritis of spine with radiculopathy, cervical region 09/17/2016   B12 deficiency 10/21/2014   Neuropathic pain of both legs 02/11/2014   Hepatic steatosis 04/27/2013   GAD (generalized anxiety disorder) 09/23/2009   Hyperlipidemia associated with type 2 diabetes mellitus ([place]) 11/03/2008   Hypertension associated with diabetes ([place]) 09/29/2008   PAP SMEAR, ABNORMAL 09/29/2008      Laureen Abrahams, PT, DPT 07/03/21 12:54 PM   [facility] [address] [place], [place],  [address] Phone: [phone]   Fax:  [phone]  Name: MYISHA PICKEREL MRN: [id number] Date of Birth: 1960-12-04

## 2021-07-04 ENCOUNTER — Encounter: Payer: Self-pay | Admitting: Family Medicine

## 2021-07-04 ENCOUNTER — Ambulatory Visit (INDEPENDENT_AMBULATORY_CARE_PROVIDER_SITE_OTHER): Payer: 59 | Admitting: Family Medicine

## 2021-07-04 VITALS — BP 120/76 | HR 76 | Temp 98.4°F | Resp 16 | Ht 71.0 in | Wt 147.0 lb

## 2021-07-04 DIAGNOSIS — Z87820 Personal history of traumatic brain injury: Secondary | ICD-10-CM

## 2021-07-04 DIAGNOSIS — R2689 Other abnormalities of gait and mobility: Secondary | ICD-10-CM | POA: Insufficient documentation

## 2021-07-04 DIAGNOSIS — R410 Disorientation, unspecified: Secondary | ICD-10-CM | POA: Insufficient documentation

## 2021-07-04 DIAGNOSIS — H539 Unspecified visual disturbance: Secondary | ICD-10-CM | POA: Diagnosis not present

## 2021-07-04 LAB — COMPREHENSIVE METABOLIC PANEL
ALT: 11 U/L (ref 0–35)
AST: 20 U/L (ref 0–37)
Albumin: 3.7 g/dL (ref 3.5–5.2)
Alkaline Phosphatase: 173 U/L — ABNORMAL HIGH (ref 39–117)
BUN: 29 mg/dL — ABNORMAL HIGH (ref 6–23)
CO2: 27 mEq/L (ref 19–32)
Calcium: 9.3 mg/dL (ref 8.4–10.5)
Chloride: 96 mEq/L (ref 96–112)
Creatinine, Ser: 1.71 mg/dL — ABNORMAL HIGH (ref 0.40–1.20)
GFR: 32.63 mL/min — ABNORMAL LOW (ref >60.00)
Glucose, Bld: 156 mg/dL — ABNORMAL HIGH (ref 70–99)
Potassium: 5.2 mEq/L — ABNORMAL HIGH (ref 3.5–5.1)
Sodium: 131 mEq/L — ABNORMAL LOW (ref 135–145)
Total Bilirubin: 1 mg/dL (ref 0.2–1.2)
Total Protein: 6.8 g/dL (ref 6.0–8.3)

## 2021-07-04 LAB — CBC WITH DIFFERENTIAL/PLATELET
Basophils Absolute: 0.1 10*3/uL (ref 0.0–0.1)
Basophils Relative: 1 % (ref 0.0–3.0)
Eosinophils Absolute: 0.2 10*3/uL (ref 0.0–0.7)
Eosinophils Relative: 3.1 % (ref 0.0–5.0)
HCT: 33.4 % — ABNORMAL LOW (ref 36.0–46.0)
Hemoglobin: 11.3 g/dL — ABNORMAL LOW (ref 12.0–15.0)
Lymphocytes Relative: 17.9 % (ref 12.0–46.0)
Lymphs Abs: 0.9 10*3/uL (ref 0.7–4.0)
MCHC: 33.7 g/dL (ref 30.0–36.0)
MCV: 87 fl (ref 78.0–100.0)
Monocytes Absolute: 0.3 10*3/uL (ref 0.1–1.0)
Monocytes Relative: 5.9 % (ref 3.0–12.0)
Neutro Abs: 3.8 10*3/uL (ref 1.4–7.7)
Neutrophils Relative %: 72.1 % (ref 43.0–77.0)
Platelets: 165 10*3/uL (ref 150.0–400.0)
RBC: 3.84 Mil/uL — ABNORMAL LOW (ref 3.87–5.11)
RDW: 14.7 % (ref 11.5–15.5)
WBC: 5.2 10*3/uL (ref 4.0–10.5)

## 2021-07-04 LAB — AMMONIA: Ammonia: 39 umol/L — ABNORMAL HIGH (ref 11–35)

## 2021-07-04 LAB — VITAMIN D 25 HYDROXY (VIT D DEFICIENCY, FRACTURES): VITD: 44.82 ng/mL (ref 30.00–100.00)

## 2021-07-04 LAB — VITAMIN B12: Vitamin B-12: 260 pg/mL (ref 211–911)

## 2021-07-04 LAB — TSH: TSH: 1.24 u[IU]/mL (ref 0.35–5.50)

## 2021-07-04 NOTE — Patient Instructions (Signed)
Please stop at the lab to have labs drawn. We will work on neuro referral as discussed.  Keep eye MD visit.

## 2021-07-04 NOTE — Progress Notes (Incomplete)
Patient ID: Mallory Stewart, female    DOB: February 26, 1961, 61 y.o.   MRN: [id number]  This visit was conducted in person.  BP 120/76 (BP Location: Left Arm, Patient Position: Sitting, Cuff Size: Normal)    Pulse 76    Temp 98.4 F (36.9 C) (Oral)    Resp 16    Ht $R'5\' 11"'QY$  (1.803 m)    Wt 147 lb (66.7 kg)    SpO2 99%    BMI 20.50 kg/m    CC:  Chief Complaint  Patient presents with   Confusion    Pt states weekend before christmas she had a cognitive episode, she was at [employer] house and mom noticed that pt woke up from a nap and she went to the pantry and had the doors closed and was speaking to herself. She had no recent episodes since. Had severe concussion about 5 yrs ago and most recent was about 1 yr ago,     Subjective:   HPI: Mallory Stewart is a 61 y.o. female presenting on 07/04/2021 for Confusion (Pt states weekend before christmas she had a cognitive episode, she was at [employer] house and mom noticed that pt woke up from a nap and she went to the pantry and had the doors closed and was speaking to herself. She had no recent episodes since. Had severe concussion about 5 yrs ago and most recent was about 1 yr ago, )  History of alcoholic cirrhosis, alcoholic pancreatitis, diabetes and hypertension   After a nap  her Mom noted she was in the pantry talking to her self. Mother states that she was rude as well.  Mom call neighbor EMTs.  Exam at that time  EMT reported lucidity.  She had taken an oxycodone that day.  No associated spelss like shaking, jitteryness   Also she had an episode of opening presents and not recalling it after the fact. Had not taken any pain meds.  She noted last week, she felt she could not remember if they ate dinner the night before. She was the  one who had called to order it. Had not taken pain meds.  No other family has noted confusion or memory issue.  No med changes.  She  takes  oxycodone  3-4 days per week..   She denies ETOH use other  than 1/2 glass champagne  on Xmas Eve.  FBS 80-141  She has had some los BP lately.  Fuzzy central vision... has occurred intermittently in both eyes... now only notes slight blur in right eye, left eye. If blinks  eye clears.  No head injury.  Allergies are acting up... watery, itchy eyes  No eye pain or redness.  Has opthalmology OV  in March.  Has been using allergy eye drops.  Using elavil nightly, rarely using  alprazolam.   Hx of  multiple concussion ... most recent in last year  Has not seen neurologist for peripheral neuropathy for years, Has had increase in balance issues and many falls in last year.. resulting in fractures. Currently in  a wheelchair for 6 weeks. Saw Dr. Earle Gell at [facility] in 2017,  Dr..Jaffe prior.     Relevant past medical, surgical, family and social history reviewed and updated as indicated. Interim medical history since our last visit reviewed. Allergies and medications reviewed and updated. Outpatient Medications Prior to Visit  Medication Sig Dispense Refill   ALPRAZolam (XANAX) 0.25 MG tablet TAKE 1 TABLET(0.25 MG) BY MOUTH DAILY AS NEEDED  FOR ANXIETY 30 tablet 0   amitriptyline (ELAVIL) 75 MG tablet Take 1 tablet (75 mg total) by mouth at bedtime. 30 tablet 5   aspirin EC 81 MG EC tablet Take 1 tablet (81 mg total) by mouth 2 (two) times daily. Swallow whole. 30 tablet 11   Blood Glucose Monitoring Suppl (ONE TOUCH ULTRA 2) w/Device KIT Use to check blood sugars two times day 1 each 0   carvedilol (COREG) 12.5 MG tablet TAKE 1 TABLET(12.5 MG) BY MOUTH TWICE DAILY WITH A MEAL 60 tablet 0   cetirizine (ZYRTEC) 10 MG tablet Take 10 mg by mouth at bedtime.     empagliflozin (JARDIANCE) 25 MG TABS tablet Take 1 tablet (25 mg total) by mouth daily before breakfast. 90 tablet 3   ferrous sulfate 325 (65 FE) MG tablet Take 1 tablet (325 mg total) by mouth 2 (two) times daily with a meal. 60 tablet 3   furosemide (LASIX) 20 MG tablet Take 1 tablet (20 mg  total) by mouth daily. 90 tablet 1   insulin glargine, 1 Unit Dial, (TOUJEO SOLOSTAR) 300 UNIT/ML Solostar Pen Inject 30 Units into the skin daily at 2 PM. 15 mL 3   Insulin Pen Needle 32G X 4 MM MISC 1 Device by Does not apply route daily in the afternoon. 100 each 3   Lancets (ONETOUCH ULTRASOFT) lancets Use to check blood sugar two times a day. 200 each 3   losartan (COZAAR) 100 MG tablet TAKE 1 TABLET(100 MG) BY MOUTH DAILY 90 tablet 0   Multiple Vitamin (MULTIVITAMIN WITH MINERALS) TABS tablet Take 1 tablet by mouth daily.     ONETOUCH ULTRA test strip CHECK BLOOD SUGAR TWO TIMES DAILY AS DIRECTED 100 strip 7   oxyCODONE (OXY IR/ROXICODONE) 5 MG immediate release tablet Take 1-2 tablets (5-10 mg total) by mouth 2 (two) times daily. 10 tablet 0   pantoprazole (PROTONIX) 40 MG tablet TAKE 1 TABLET BY MOUTH TWICE DAILY BEFORE A MEAL 180 tablet 1   spironolactone (ALDACTONE) 50 MG tablet Take 1 tablet (50 mg total) by mouth daily. 90 tablet 1   TOUJEO MAX SOLOSTAR 300 UNIT/ML Solostar Pen INJECT 34 UNITS INTO THE SKIN DAILY AT 2 PM 15 mL 6   No facility-administered medications prior to visit.     Per HPI unless specifically indicated in ROS section below Review of Systems Objective:  BP 120/76 (BP Location: Left Arm, Patient Position: Sitting, Cuff Size: Normal)    Pulse 76    Temp 98.4 F (36.9 C) (Oral)    Resp 16    Ht $R'5\' 11"'Bc$  (1.803 m)    Wt 147 lb (66.7 kg)    SpO2 99%    BMI 20.50 kg/m   Wt Readings from Last 3 Encounters:  07/04/21 147 lb (66.7 kg)  06/29/21 150 lb (68 kg)  06/15/21 150 lb (68 kg)      Physical Exam    Results for orders placed or performed in visit on 98/92/11  Basic Metabolic Panel (BMET)  Result Value Ref Range   Sodium 131 (L) 135 - 145 mEq/L   Potassium 3.8 3.5 - 5.1 mEq/L   Chloride 96 96 - 112 mEq/L   CO2 24 19 - 32 mEq/L   Glucose, Bld 203 (H) 70 - 99 mg/dL   BUN 14 6 - 23 mg/dL   Creatinine, Ser 1.25 (H) 0.40 - 1.20 mg/dL   GFR 47.59  (L) >60.00 mL/min   Calcium 9.1 8.4 - 10.5  mg/dL    This visit occurred during the SARS-CoV-2 public health emergency.  Safety protocols were in place, including screening questions prior to the visit, additional usage of staff PPE, and extensive cleaning of exam room while observing appropriate contact time as indicated for disinfecting solutions.   COVID 19 screen:  No recent travel or known exposure to COVID19 The patient denies respiratory symptoms of COVID 19 at this time. The importance of social distancing was discussed today.   Assessment and Plan     Eliezer Lofts, MD

## 2021-07-05 ENCOUNTER — Other Ambulatory Visit: Payer: Self-pay | Admitting: Internal Medicine

## 2021-07-05 DIAGNOSIS — I10 Essential (primary) hypertension: Secondary | ICD-10-CM

## 2021-07-11 ENCOUNTER — Encounter: Payer: 59 | Admitting: Physical Therapy

## 2021-07-12 ENCOUNTER — Other Ambulatory Visit: Payer: Self-pay | Admitting: Internal Medicine

## 2021-07-12 ENCOUNTER — Telehealth: Payer: Self-pay

## 2021-07-12 MED ORDER — BASAGLAR KWIKPEN 100 UNIT/ML ~~LOC~~ SOPN: 30.0000 [IU] | PEN_INJECTOR | Freq: Every day | SUBCUTANEOUS | 3 refills | Status: DC

## 2021-07-12 NOTE — Telephone Encounter (Signed)
Do we have a status on the PA that was submitted for the Toujeo.  ?

## 2021-07-13 NOTE — Telephone Encounter (Signed)
Mychart message sent to patient with medication change.  ?

## 2021-07-17 ENCOUNTER — Telehealth: Payer: Self-pay

## 2021-07-17 NOTE — Telephone Encounter (Signed)
Spoke with patient and advised that the Basaglar was sent in replacement of the Toujeo.  ?

## 2021-07-19 ENCOUNTER — Other Ambulatory Visit: Payer: Self-pay | Admitting: Specialist

## 2021-07-20 ENCOUNTER — Telehealth: Payer: Self-pay | Admitting: Family Medicine

## 2021-07-20 NOTE — Telephone Encounter (Signed)
Dr Everlena Cooper office has   denied referral  call the office they  were at lunch will call back to  talk with someone after lunch. ?

## 2021-07-20 NOTE — Telephone Encounter (Signed)
Pt called checking on status of her referral to the neurologist. Please advise. ?

## 2021-07-20 NOTE — Telephone Encounter (Signed)
Called and lvm for patient to call us back. 

## 2021-07-21 ENCOUNTER — Other Ambulatory Visit: Payer: Self-pay

## 2021-07-21 DIAGNOSIS — R410 Disorientation, unspecified: Secondary | ICD-10-CM

## 2021-07-21 DIAGNOSIS — Z87820 Personal history of traumatic brain injury: Secondary | ICD-10-CM

## 2021-07-21 DIAGNOSIS — R296 Repeated falls: Secondary | ICD-10-CM

## 2021-07-21 DIAGNOSIS — R2689 Other abnormalities of gait and mobility: Secondary | ICD-10-CM

## 2021-07-21 NOTE — Telephone Encounter (Signed)
Called and  detail vm  to inform patient that we  resubmitted  referral  and some from their office will be giving her a call regarding an appt.  ?

## 2021-07-21 NOTE — Telephone Encounter (Signed)
Called and spoke with Neuology the reason why  it was denied was because of  incomplete notes, she said once the notes were complete wwe can sent the referral back though. ?

## 2021-07-24 ENCOUNTER — Telehealth: Payer: Self-pay

## 2021-07-24 MED ORDER — GLIPIZIDE 5 MG PO TABS: 5.0000 mg | ORAL_TABLET | Freq: Every day | ORAL | 1 refills | Status: DC

## 2021-07-24 NOTE — Telephone Encounter (Signed)
Patient states that Mallory Stewart is still $1200 with coupon. ?

## 2021-07-24 NOTE — Telephone Encounter (Signed)
Patient states that Mallory Stewart is $1600 dollars and she is unable to afford. Would like to know if there is something else she can start on . ?

## 2021-07-24 NOTE — Telephone Encounter (Signed)
Patient will stop by to pick up a saving card. I will place in the folder up front.  ?

## 2021-07-25 ENCOUNTER — Other Ambulatory Visit: Payer: Self-pay

## 2021-07-25 ENCOUNTER — Encounter: Payer: Self-pay | Admitting: Physical Therapy

## 2021-07-25 ENCOUNTER — Ambulatory Visit (INDEPENDENT_AMBULATORY_CARE_PROVIDER_SITE_OTHER): Payer: 59 | Admitting: Physical Therapy

## 2021-07-25 DIAGNOSIS — M25572 Pain in left ankle and joints of left foot: Secondary | ICD-10-CM | POA: Diagnosis not present

## 2021-07-25 DIAGNOSIS — R262 Difficulty in walking, not elsewhere classified: Secondary | ICD-10-CM

## 2021-07-25 DIAGNOSIS — M25672 Stiffness of left ankle, not elsewhere classified: Secondary | ICD-10-CM

## 2021-07-25 DIAGNOSIS — M6281 Muscle weakness (generalized): Secondary | ICD-10-CM | POA: Diagnosis not present

## 2021-07-25 DIAGNOSIS — R2681 Unsteadiness on feet: Secondary | ICD-10-CM

## 2021-07-25 DIAGNOSIS — R600 Localized edema: Secondary | ICD-10-CM

## 2021-07-25 LAB — HM DIABETES EYE EXAM

## 2021-07-25 NOTE — Telephone Encounter (Signed)
LMTCB

## 2021-07-25 NOTE — Therapy (Signed)
[facility] [address] [place], [place], [address] Phone: [phone]   Fax:  [phone]  Physical Therapy Treatment  Patient Details  Name: Mallory Stewart MRN: [id number] Date of Birth: [birth year] Referring Provider (PT): Kerrin Champagne, MD   Encounter Date: 07/25/2021   PT End of Session - 07/25/21 0932     Visit Number 5    Number of Visits 8    Date for PT Re-Evaluation 08/28/21    Authorization Type [employer] $150 copay after deductible met    Authorization - Visit Number 5    PT Start Time 0930    PT Stop Time 1010    PT Time Calculation (min) 40 min    Activity Tolerance Patient tolerated treatment well    Behavior During Therapy [facility] for tasks assessed/performed             Past Medical History:  Diagnosis Date   Alcohol-induced chronic pancreatitis (HCC)    Alcoholic cirrhosis (HCC)    B12 deficiency    Concussion    DDD (degenerative disc disease), cervical    Diabetes (HCC)    DKA, type 2 (HCC)    Gastric outlet obstruction 08/12/2018   Hypertension    Neuropathy    Pancreatic pseudocyst/cyst 05/06/2013   Seasonal allergies     Past Surgical History:  Procedure Laterality Date   ANKLE ARTHROSCOPY Right 12/20/2020   Procedure: RIGHT ANKLE ARTHROSCOPIC DEBRIDEMENT;  Surgeon: Nadara Mustard, MD;  Location: [facility];  Service: Orthopedics;  Laterality: Right;   BIOPSY  01/19/2019   Procedure: BIOPSY;  Surgeon: Meridee Score Netty Starring., MD;  Location: [facility] ENDOSCOPY;  Service: Gastroenterology;;   CHOLECYSTECTOMY N/A 07/28/2016   Procedure: LAPAROSCOPIC CHOLECYSTECTOMY WITH INTRAOPERATIVE CHOLANGIOGRAM;  Surgeon: Rodman Pickle, MD;  Location: [facility] OR;  Service: General;  Laterality: N/A;   ESOPHAGOGASTRODUODENOSCOPY (EGD) WITH PROPOFOL N/A 08/13/2018   Procedure: ESOPHAGOGASTRODUODENOSCOPY (EGD) WITH PROPOFOL;  Surgeon: Lemar Lofty., MD;  Location: [facility] ENDOSCOPY;  Service: Gastroenterology;   Laterality: N/A;   ESOPHAGOGASTRODUODENOSCOPY (EGD) WITH PROPOFOL N/A 01/19/2019   Procedure: ESOPHAGOGASTRODUODENOSCOPY (EGD) WITH PROPOFOL;  Surgeon: Meridee Score Netty Starring., MD;  Location: [facility] ENDOSCOPY;  Service: Gastroenterology;  Laterality: N/A;   IR PARACENTESIS  08/08/2020   IR PARACENTESIS  03/13/2021   LEEP  1990's   ORIF FOOT FRACTURE  09/2008   L 5th metatarsal   REFRACTIVE SURGERY  2000   TIBIA IM NAIL INSERTION Left 03/12/2021   Procedure: INTRAMEDULLARY (IM) NAIL TIBIAL;  Surgeon: Kerrin Champagne, MD;  Location: [facility] OR;  Service: Orthopedics;  Laterality: Left;   TONSILLECTOMY     UPPER ESOPHAGEAL ENDOSCOPIC ULTRASOUND (EUS) N/A 08/13/2018   Procedure: UPPER ESOPHAGEAL ENDOSCOPIC ULTRASOUND (EUS);  Surgeon: Lemar Lofty., MD;  Location: [facility] ENDOSCOPY;  Service: Gastroenterology;  Laterality: N/A;   UPPER ESOPHAGEAL ENDOSCOPIC ULTRASOUND (EUS) N/A 01/19/2019   Procedure: UPPER ESOPHAGEAL ENDOSCOPIC ULTRASOUND (EUS);  Surgeon: Lemar Lofty., MD;  Location: [facility] ENDOSCOPY;  Service: Gastroenterology;  Laterality: N/A;    There were no vitals filed for this visit.   Subjective Assessment - 07/25/21 0935     Subjective walking at home 75% of the time, arrives today in w/c    Pertinent History DDD cervical, HTN, h/o left ankle sprain, closed fx of distal end of left tibia, closed fx of promimal fibula    Patient Stated Goals pain free independent mobility    Currently in Pain? Yes    Pain Score 4  up to 10/10   Pain Location Ankle    Pain Orientation Left    Pain Descriptors / Indicators Aching;Sore;Tightness    Pain Type Chronic pain;Surgical pain    Pain Onset More than a month ago    Pain Frequency Constant    Aggravating Factors  still tightness    Pain Relieving Factors nothing specific                               [facility] Adult PT Treatment/Exercise - 07/25/21 0950       Ambulation/Gait   Gait Comments amb with RW with 50% WB mod I;  added lift to Rt shoe to help with discrepancy with boot      Exercises   Exercises Knee/Hip    Other Exercises  verbal review of current HEP - pt able to verbalize her current program      Knee/Hip Exercises: Seated   Long Arc Quad Left;3 sets;10 reps   with boot on for weight     Knee/Hip Exercises: Sidelying   Hip ABduction Left;10 reps;2 sets    Hip ABduction Limitations cues needed to decrease hip flexor substitution; added slight extension after raising leg    Clams LLE 3x10 with 5 sec hold      Knee/Hip Exercises: Prone   Straight Leg Raises Left;2 sets;10 reps                       PT Short Term Goals - 06/20/21 0957       PT SHORT TERM GOAL #1   Title Patient will demonstrate independent use of home exercise program to maintain progress from in clinic treatments.    Time 4    Period Weeks    Status Achieved    Target Date 06/06/21               PT Long Term Goals - 07/03/21 1249       PT LONG TERM GOAL #1   Title Patient will demonstrate/report pain at worst less than or equal to 2/10 to facilitate minimal limitation in daily activity secondary to pain symptoms.    Time 8    Period Weeks    Status On-going    Target Date 08/28/21      PT LONG TERM GOAL #2   Title Patient will demonstrate independent use of home exercise program to facilitate ability to maintain/progress functional gains from skilled physical therapy services.    Time 8    Period Weeks    Status On-going    Target Date 08/28/21      PT LONG TERM GOAL #3   Title Pt. will demonstrate FOTO outcome > or = 59 to indicated reduced disability due to condition.    Time 8    Period Weeks    Status On-going    Target Date 08/28/21      PT LONG TERM GOAL #4   Title Pt. will demonstrate modified independent community ambulation > 300 ft s deviation with SPC for improved mobility and decreased fall risk    Time 8    Period Weeks    Status On-going    Target Date 08/28/21       PT LONG TERM GOAL #5   Title Pt. will demonstrate Lt ankle df to at least neutral, inversion/eversion > 25 degrees to facilitate usual mobility for daily walking, standing, transfers at PLOF.  Time 8    Period Weeks    Status On-going    Target Date 08/28/21      PT LONG TERM GOAL #6   Title Pt. will demonstrate Lt ankle MMT 4/5 throughout to facilitate usual daily and recreational activity at PLOF.    Time 8    Period Weeks    Status On-going    Target Date 08/28/21                   Plan - 07/25/21 1010     Clinical Impression Statement Pt returns after working on HEP at home.  Still steadily progressing as able, did provide hip and knee HEP to help address weakness there.  She's having increased sensitivity and pain in Lt knee and encouraged her to discuss at next Md visit.  Will check in after next MD appt.    Personal Factors and Comorbidities Comorbidity 3+    Comorbidities DDD cervical, HTN, extensive vestibular complication history, Rt ankle fx    Examination-Activity Limitations Bend;Squat;Stairs;Carry;Stand;Transfers;Dressing;Locomotion Level    Examination-Participation Restrictions Cleaning;Community Activity;Shop;Driving;Yard Work;Laundry;Occupation    Stability/Clinical Decision Making Evolving/Moderate complexity    Rehab Potential Good    PT Frequency 1x / week   set to 1x/wk due to insurance/financial obligation   PT Duration 8 weeks    PT Treatment/Interventions ADLs/Self Care Home Management;Cryotherapy;Electrical Stimulation;Iontophoresis 4mg /ml Dexamethasone;Moist Heat;Balance training;Therapeutic exercise;Therapeutic activities;Functional mobility training;Stair training;Gait training;DME Instruction;Ultrasound;Neuromuscular re-education;Patient/family education;Passive range of motion;Taping;Vasopneumatic Device;Manual techniques;Canalith Repostioning;Vestibular    PT Next Visit Plan continue 50% WB, see what MD says, recert as indicated    PT Home  Exercise Plan [email or web address]    Consulted and Agree with Plan of Care Patient             Patient will benefit from skilled therapeutic intervention in order to improve the following deficits and impairments:  Abnormal gait, Decreased endurance, Hypomobility, Increased edema, Decreased activity tolerance, Decreased strength, Pain, Decreased balance, Decreased mobility, Difficulty walking, Impaired perceived functional ability, Improper body mechanics, Impaired flexibility, Decreased coordination, Decreased range of motion, Dizziness, Impaired sensation  Visit Diagnosis: Pain in left ankle and joints of left foot  Muscle weakness (generalized)  Stiffness of left ankle, not elsewhere classified  Difficulty in walking, not elsewhere classified  Localized edema  Unsteadiness on feet     Problem List Patient Active Problem List   Diagnosis Date Noted   Balance problem 07/04/2021   Confusion 07/04/2021   Vision changes 07/04/2021   H/O multiple concussions 07/04/2021   Hypocalcemia 03/24/2021   Type 2 diabetes mellitus with hyperglycemia, with long-term current use of insulin (HCC) 03/24/2021   Secondary esophageal varices without bleeding (HCC)    Closed fracture of distal end of left tibia, initial encounter 03/12/2021    Class: Acute   Closed fracture of proximal fibula 03/12/2021    Class: Acute   Displaced spiral fracture of shaft of left tibia, initial encounter for closed fracture 03/12/2021   Impingement of right ankle joint    Gastric varices without bleeding 12/06/2020   Portal hypertension (HCC) 09/30/2020   Dilated bile duct 09/30/2020   Alcoholic cirrhosis of liver with ascites (HCC) 09/29/2020   Ascites 09/17/2020   Right foot pain 09/17/2020   Laceration of lesser toe of right foot without foreign body present or damage to nail 09/17/2020   Acute right ankle pain 09/17/2020   Frequent falls 09/14/2020   Closed fracture of ankle 09/14/2020   Foot sprain, right,  subsequent encounter 08/26/2020  Sprain of anterior talofibular ligament of right ankle 08/26/2020   Splenomegaly 08/09/2020   Malnutrition of moderate degree (HCC) 08/08/2020   Transaminitis 08/06/2020   Serum total bilirubin elevated 08/06/2020   Fall at home, initial encounter 08/06/2020   Generalized weakness 08/05/2020   Idiopathic peripheral neuropathy 07/13/2020   Type 2 diabetes mellitus with diabetic polyneuropathy, with long-term current use of insulin (HCC) 07/13/2020   Persistent cough for 3 weeks or longer 07/04/2020   Low HDL (under 40) 06/21/2020   Post-nasal drip 06/21/2020   Allergic rhinitis 06/21/2020   Elevated alkaline phosphatase level 06/21/2020   Duodenitis - edema ? andgioedema 10/30/2018   Chronic alcoholic pancreatitis (HCC) 09/02/2018   Diabetes mellitus with circulatory complication, HTN (HCC) 08/22/2018   Dizziness 12/26/2017   Chronic pain 05/17/2017   Osteoarthritis of spine with radiculopathy, cervical region 09/17/2016   B12 deficiency 10/21/2014   Neuropathic pain of both legs 02/11/2014   Hepatic steatosis 04/27/2013   GAD (generalized anxiety disorder) 09/23/2009   Hyperlipidemia associated with type 2 diabetes mellitus (HCC) 11/03/2008   Hypertension associated with diabetes (HCC) 09/29/2008   PAP SMEAR, ABNORMAL 09/29/2008    .   Clarita Crane, PT, DPT 07/25/21 10:13 AM   [facility] Physical Therapy [address] [place], [place], [address] Phone: [phone]   Fax:  [phone]  Name: Mallory Stewart MRN: [id number] Date of Birth: November 27, 1960

## 2021-07-25 NOTE — Patient Instructions (Signed)
Access Code: [email or web address] ?URL: [email or web address] ?Date: 07/25/2021 ?Prepared by: Faustino Congress ? ?Exercises ?Ankle Pumps in Elevation - 2 x daily - 7 x weekly - 3 sets - 10 reps ?Supine Ankle Inversion Eversion AROM - 2 x daily - 7 x weekly - 3 sets - 10 reps ?Ankle Alphabet in Elevation - 2 x daily - 7 x weekly - 3 sets - 10 reps ?Seated Calf Stretch with Strap - 2 x daily - 7 x weekly - 1 sets - 5 reps - 15-20 hold ?Long Sitting Ankle Eversion with Resistance - 2 x daily - 7 x weekly - 3 sets - 10 reps ?Long Sitting Ankle Plantar Flexion with Resistance - 2 x daily - 7 x weekly - 3 sets - 10 reps ?Long Sitting Ankle Inversion with Resistance - 2 x daily - 7 x weekly - 3 sets - 10 reps ?Long Sitting Ankle Dorsiflexion with Anchored Resistance - 2 x daily - 7 x weekly - 3 sets - 10 reps ?Standing Dorsiflexion Self-Mobilization on Step - 2 x daily - 7 x weekly - 3 sets - 10 reps - 2-3 hold ?Side to Side Weight Shift with Counter Support - 1 x daily - 7 x weekly - 1 sets - 15 reps - 3-5 sec hold ?Mini Squat with Counter Support - 1 x daily - 7 x weekly - 1 sets - 15 reps ?Seated Long Arc Quad - 1 x daily - 7 x weekly - 3 sets - 10 reps - 3 sec hold ?Sidelying Hip Abduction - 1 x daily - 7 x weekly - 2 sets - 10 reps ?Clamshell - 1 x daily - 7 x weekly - 3 sets - 10 reps ?Prone Hip Extension - 1 x daily - 7 x weekly - 2-3 sets - 10 reps ? ?

## 2021-07-25 NOTE — Telephone Encounter (Signed)
Patient notified and verbalized understanding. 

## 2021-07-30 ENCOUNTER — Other Ambulatory Visit: Payer: Self-pay | Admitting: Internal Medicine

## 2021-07-31 ENCOUNTER — Encounter: Payer: Self-pay | Admitting: Family Medicine

## 2021-07-31 ENCOUNTER — Telehealth: Payer: Self-pay | Admitting: Family Medicine

## 2021-07-31 ENCOUNTER — Telehealth: Payer: Self-pay | Admitting: Internal Medicine

## 2021-07-31 MED ORDER — SPIRONOLACTONE 50 MG PO TABS: 50.0000 mg | ORAL_TABLET | Freq: Every day | ORAL | 1 refills | Status: DC

## 2021-07-31 MED ORDER — FUROSEMIDE 20 MG PO TABS: 20.0000 mg | ORAL_TABLET | Freq: Every day | ORAL | 1 refills | Status: DC

## 2021-07-31 NOTE — Telephone Encounter (Signed)
Inbound call from patient requesting refill for Spironolactone and Furosemide sent to [facility] in [place]. Patient have upcoming appt 4/5 ?

## 2021-07-31 NOTE — Telephone Encounter (Signed)
Patient is calling regarding referral to Neurology, states she received a call from our office that the referral had been submitted. ? ?I informed the patient that we are awaiting all notes to be completed so that the referred office has everything they need to be reviewed prior to scheduling. ? ?

## 2021-07-31 NOTE — Telephone Encounter (Signed)
This message is for Dr Ermalene Searing.  ?

## 2021-07-31 NOTE — Telephone Encounter (Signed)
Spironolactone and furosemide refilled as requested. At her January office visit Dr Leone Payor said continue current meds. We will see her early in April. ?

## 2021-08-01 ENCOUNTER — Encounter: Payer: Self-pay | Admitting: Physician Assistant

## 2021-08-01 NOTE — Telephone Encounter (Signed)
[facility] is working on scheduling pt ?

## 2021-08-07 ENCOUNTER — Other Ambulatory Visit (INDEPENDENT_AMBULATORY_CARE_PROVIDER_SITE_OTHER): Payer: 59

## 2021-08-07 ENCOUNTER — Telehealth: Payer: Self-pay

## 2021-08-07 ENCOUNTER — Emergency Department (HOSPITAL_COMMUNITY)
Admission: EM | Admit: 2021-08-07 | Discharge: 2021-08-08 | Disposition: A | Discharge status: Home / Self Care | Payer: 59 | Attending: Emergency Medicine | Admitting: Emergency Medicine

## 2021-08-07 ENCOUNTER — Emergency Department (HOSPITAL_COMMUNITY): Payer: 59

## 2021-08-07 ENCOUNTER — Other Ambulatory Visit: Payer: Self-pay

## 2021-08-07 DIAGNOSIS — Z7984 Long term (current) use of oral hypoglycemic drugs: Secondary | ICD-10-CM | POA: Diagnosis not present

## 2021-08-07 DIAGNOSIS — E878 Other disorders of electrolyte and fluid balance, not elsewhere classified: Secondary | ICD-10-CM | POA: Diagnosis not present

## 2021-08-07 DIAGNOSIS — E1122 Type 2 diabetes mellitus with diabetic chronic kidney disease: Secondary | ICD-10-CM | POA: Diagnosis not present

## 2021-08-07 DIAGNOSIS — D649 Anemia, unspecified: Secondary | ICD-10-CM | POA: Insufficient documentation

## 2021-08-07 DIAGNOSIS — Z79899 Other long term (current) drug therapy: Secondary | ICD-10-CM | POA: Insufficient documentation

## 2021-08-07 DIAGNOSIS — E875 Hyperkalemia: Secondary | ICD-10-CM | POA: Insufficient documentation

## 2021-08-07 DIAGNOSIS — E871 Hypo-osmolality and hyponatremia: Secondary | ICD-10-CM | POA: Diagnosis not present

## 2021-08-07 DIAGNOSIS — R000 Tachycardia, unspecified: Secondary | ICD-10-CM | POA: Insufficient documentation

## 2021-08-07 DIAGNOSIS — N1832 Chronic kidney disease, stage 3b: Secondary | ICD-10-CM

## 2021-08-07 DIAGNOSIS — Z20822 Contact with and (suspected) exposure to covid-19: Secondary | ICD-10-CM | POA: Insufficient documentation

## 2021-08-07 DIAGNOSIS — N189 Chronic kidney disease, unspecified: Secondary | ICD-10-CM | POA: Insufficient documentation

## 2021-08-07 DIAGNOSIS — R059 Cough, unspecified: Secondary | ICD-10-CM | POA: Insufficient documentation

## 2021-08-07 DIAGNOSIS — R799 Abnormal finding of blood chemistry, unspecified: Secondary | ICD-10-CM | POA: Diagnosis present

## 2021-08-07 DIAGNOSIS — I152 Hypertension secondary to endocrine disorders: Secondary | ICD-10-CM

## 2021-08-07 DIAGNOSIS — K7031 Alcoholic cirrhosis of liver with ascites: Secondary | ICD-10-CM

## 2021-08-07 DIAGNOSIS — E1165 Type 2 diabetes mellitus with hyperglycemia: Secondary | ICD-10-CM | POA: Insufficient documentation

## 2021-08-07 DIAGNOSIS — Z794 Long term (current) use of insulin: Secondary | ICD-10-CM

## 2021-08-07 DIAGNOSIS — I10 Essential (primary) hypertension: Secondary | ICD-10-CM | POA: Diagnosis present

## 2021-08-07 DIAGNOSIS — Z7982 Long term (current) use of aspirin: Secondary | ICD-10-CM | POA: Insufficient documentation

## 2021-08-07 DIAGNOSIS — E1140 Type 2 diabetes mellitus with diabetic neuropathy, unspecified: Secondary | ICD-10-CM | POA: Diagnosis not present

## 2021-08-07 DIAGNOSIS — E1159 Type 2 diabetes mellitus with other circulatory complications: Secondary | ICD-10-CM

## 2021-08-07 DIAGNOSIS — E1142 Type 2 diabetes mellitus with diabetic polyneuropathy: Secondary | ICD-10-CM

## 2021-08-07 LAB — BASIC METABOLIC PANEL
Anion gap: 10 (ref 5–15)
BUN: 31 mg/dL — ABNORMAL HIGH (ref 6–20)
CO2: 23 mmol/L (ref 22–32)
Calcium: 9.5 mg/dL (ref 8.9–10.3)
Chloride: 92 mmol/L — ABNORMAL LOW (ref 98–111)
Creatinine, Ser: 1.58 mg/dL — ABNORMAL HIGH (ref 0.44–1.00)
GFR, Estimated: 38 mL/min — ABNORMAL LOW (ref >60)
Glucose, Bld: 176 mg/dL — ABNORMAL HIGH (ref 70–99)
Potassium: 4.5 mmol/L (ref 3.5–5.1)
Sodium: 125 mmol/L — ABNORMAL LOW (ref 135–145)

## 2021-08-07 LAB — COMPREHENSIVE METABOLIC PANEL
ALT: 12 U/L (ref 0–35)
ALT: 16 U/L (ref 0–44)
AST: 22 U/L (ref 0–37)
AST: 27 U/L (ref 15–41)
Albumin: 4.2 g/dL (ref 3.5–5.2)
Albumin: 3.9 g/dL (ref 3.5–5.0)
Alkaline Phosphatase: 201 U/L — ABNORMAL HIGH (ref 39–117)
Alkaline Phosphatase: 197 U/L — ABNORMAL HIGH (ref 38–126)
Anion gap: 12 (ref 5–15)
BUN: 34 mg/dL — ABNORMAL HIGH (ref 6–23)
BUN: 31 mg/dL — ABNORMAL HIGH (ref 6–20)
CO2: 26 mEq/L (ref 19–32)
CO2: 22 mmol/L (ref 22–32)
Calcium: 10.4 mg/dL (ref 8.4–10.5)
Calcium: 9.9 mg/dL (ref 8.9–10.3)
Chloride: 87 mEq/L — ABNORMAL LOW (ref 96–112)
Chloride: 88 mmol/L — ABNORMAL LOW (ref 98–111)
Creatinine, Ser: 1.84 mg/dL — ABNORMAL HIGH (ref 0.40–1.20)
Creatinine, Ser: 1.75 mg/dL — ABNORMAL HIGH (ref 0.44–1.00)
GFR, Estimated: 33 mL/min — ABNORMAL LOW (ref >60)
GFR: 29.87 mL/min — ABNORMAL LOW (ref >60.00)
Glucose, Bld: 181 mg/dL — ABNORMAL HIGH (ref 70–99)
Glucose, Bld: 313 mg/dL — ABNORMAL HIGH (ref 70–99)
Potassium: 5.8 mEq/L — ABNORMAL HIGH (ref 3.5–5.1)
Potassium: 5.5 mmol/L — ABNORMAL HIGH (ref 3.5–5.1)
Sodium: 121 mEq/L — CL (ref 135–145)
Sodium: 122 mmol/L — ABNORMAL LOW (ref 135–145)
Total Bilirubin: 0.8 mg/dL (ref 0.2–1.2)
Total Bilirubin: 0.6 mg/dL (ref 0.3–1.2)
Total Protein: 7.7 g/dL (ref 6.0–8.3)
Total Protein: 7.8 g/dL (ref 6.5–8.1)

## 2021-08-07 LAB — CBC WITH DIFFERENTIAL/PLATELET
Abs Immature Granulocytes: 0.04 10*3/uL (ref 0.00–0.07)
Basophils Absolute: 0 10*3/uL (ref 0.0–0.1)
Basophils Absolute: 0 10*3/uL (ref 0.0–0.1)
Basophils Relative: 0.6 % (ref 0.0–3.0)
Basophils Relative: 1 %
Eosinophils Absolute: 0.1 10*3/uL (ref 0.0–0.7)
Eosinophils Absolute: 0.1 10*3/uL (ref 0.0–0.5)
Eosinophils Relative: 1.3 % (ref 0.0–5.0)
Eosinophils Relative: 1 %
HCT: 33.1 % — ABNORMAL LOW (ref 36.0–46.0)
HCT: 31.2 % — ABNORMAL LOW (ref 36.0–46.0)
Hemoglobin: 11.3 g/dL — ABNORMAL LOW (ref 12.0–15.0)
Hemoglobin: 11 g/dL — ABNORMAL LOW (ref 12.0–15.0)
Immature Granulocytes: 1 %
Lymphocytes Relative: 15.8 % (ref 12.0–46.0)
Lymphocytes Relative: 20 %
Lymphs Abs: 0.9 10*3/uL (ref 0.7–4.0)
Lymphs Abs: 1.1 10*3/uL (ref 0.7–4.0)
MCH: 30.3 pg (ref 26.0–34.0)
MCHC: 34.2 g/dL (ref 30.0–36.0)
MCHC: 35.3 g/dL (ref 30.0–36.0)
MCV: 87.7 fl (ref 78.0–100.0)
MCV: 86 fL (ref 80.0–100.0)
Monocytes Absolute: 0.3 10*3/uL (ref 0.1–1.0)
Monocytes Absolute: 0.3 10*3/uL (ref 0.1–1.0)
Monocytes Relative: 6 % (ref 3.0–12.0)
Monocytes Relative: 6 %
Neutro Abs: 4.2 10*3/uL (ref 1.4–7.7)
Neutro Abs: 4 10*3/uL (ref 1.7–7.7)
Neutrophils Relative %: 76.3 % (ref 43.0–77.0)
Neutrophils Relative %: 71 %
Platelets: 161 10*3/uL (ref 150.0–400.0)
Platelets: 149 10*3/uL — ABNORMAL LOW (ref 150–400)
RBC: 3.78 Mil/uL — ABNORMAL LOW (ref 3.87–5.11)
RBC: 3.63 MIL/uL — ABNORMAL LOW (ref 3.87–5.11)
RDW: 13.2 % (ref 11.5–15.5)
RDW: 12.3 % (ref 11.5–15.5)
WBC: 5.5 10*3/uL (ref 4.0–10.5)
WBC: 5.5 10*3/uL (ref 4.0–10.5)
nRBC: 0 % (ref 0.0–0.2)

## 2021-08-07 LAB — PROTIME-INR
INR: 1.3 ratio — ABNORMAL HIGH (ref 0.8–1.0)
INR: 1.3 — ABNORMAL HIGH (ref 0.8–1.2)
Prothrombin Time: 13.7 s — ABNORMAL HIGH (ref 9.6–13.1)
Prothrombin Time: 15.7 seconds — ABNORMAL HIGH (ref 11.4–15.2)

## 2021-08-07 LAB — URINALYSIS, ROUTINE W REFLEX MICROSCOPIC
Bilirubin Urine: NEGATIVE
Glucose, UA: 150 mg/dL — AB
Hgb urine dipstick: NEGATIVE
Ketones, ur: NEGATIVE mg/dL
Leukocytes,Ua: NEGATIVE
Nitrite: NEGATIVE
Protein, ur: NEGATIVE mg/dL
Specific Gravity, Urine: 1.006 (ref 1.005–1.030)
pH: 5 (ref 5.0–8.0)

## 2021-08-07 LAB — NA AND K (SODIUM & POTASSIUM), RAND UR
Potassium Urine: 12 mmol/L
Sodium, Ur: 37 mmol/L

## 2021-08-07 LAB — RESP PANEL BY RT-PCR (FLU A&B, COVID) ARPGX2
Influenza A by PCR: NEGATIVE
Influenza B by PCR: NEGATIVE
SARS Coronavirus 2 by RT PCR: NEGATIVE

## 2021-08-07 LAB — CBG MONITORING, ED: Glucose-Capillary: 154 mg/dL — ABNORMAL HIGH (ref 70–99)

## 2021-08-07 LAB — APTT: aPTT: 31 seconds (ref 24–36)

## 2021-08-07 LAB — AMMONIA: Ammonia: 23 umol/L (ref 9–35)

## 2021-08-07 MED ORDER — SODIUM CHLORIDE 0.9 % IV BOLUS
500.0000 mL | Freq: Once | INTRAVENOUS | Status: AC
  Administered 2021-08-07: 500 mL via INTRAVENOUS

## 2021-08-07 MED ORDER — INSULIN ASPART 100 UNIT/ML IV SOLN
10.0000 [IU] | Freq: Once | INTRAVENOUS | Status: AC
  Administered 2021-08-07: 10 [IU] via INTRAVENOUS

## 2021-08-07 MED ORDER — SODIUM ZIRCONIUM CYCLOSILICATE 10 G PO PACK
10.0000 g | PACK | Freq: Once | ORAL | Status: AC
  Administered 2021-08-07: 10 g via ORAL
  Filled 2021-08-07: qty 1

## 2021-08-07 NOTE — Telephone Encounter (Signed)
Spoke to pt already:  ?Documented under Result Note ?

## 2021-08-07 NOTE — Assessment & Plan Note (Signed)
Baseline creatinine approximately 1.7 °

## 2021-08-07 NOTE — Telephone Encounter (Signed)
Lab called with critical result , Sodium level 121.  ?

## 2021-08-07 NOTE — ED Triage Notes (Signed)
Pt here after lab work this AM revealed hyponatremia & hyperkalemia. Sodium at 121, Possium noted at 5.8. Pt c/o confusion, blurred vision but also endorses cataracts. Pt denies SHOB, CP ?

## 2021-08-07 NOTE — Assessment & Plan Note (Signed)
Has been having episodes of dizziness with standing. States her SBP running low 100s. Will stop losartan especially in the face of hyperkalemia. ?

## 2021-08-07 NOTE — Telephone Encounter (Signed)
See lab result note  ?Patient needs to go to ED ?Sent to [facility] already ?

## 2021-08-07 NOTE — Assessment & Plan Note (Signed)
On aldactone and losartan. Given 1 dose of lokelma. Repeat BMP at 11 pm. Allow to eat and drink in ER. If potassium levels improved, plan on stopping losartan and aldactone for now. Repeat BMP in PCP office on Thursday. ?

## 2021-08-07 NOTE — Discharge Instructions (Signed)
Stop Losartan and Aldactone. Start taking Lasix every other day (next dose on Wednesday). Recheck Labs at your doctor on Thursday. ?

## 2021-08-07 NOTE — Assessment & Plan Note (Signed)
Repeat serum sodium 122 but serum glucose of 313. Correct sodium of 125. Pt states she "feels the best she has in a long while". Pt on lasix, aldactone and losartan. Pt states her BP has been running on the low side with SBP around 100s. Has occ bouts of dizziness when standing. 500 ml NS bolus ordered. Pt would like to go home. Will repeat BMP at 11 pm. Allow pt to eat. If sodium and potassium improved, plan to DC to home. Repeat labs in PCP office on Thursday. Would recommend holding aldactone and lorsartan. Continue with lasix 20 mg QOD instead of every day. Discussed with EDP. ?

## 2021-08-07 NOTE — Progress Notes (Signed)
repeat Na 125, K 4.5, BUN 31, Scr 1.58 ? ?Pt states she still feels well and wants to go home. ? ?Instructed her to stop losartan and aldactone for now. ? ?Can continue taking lasix 20 mg QOD. Started on Wednesday(08-09-2021) ? ?Have repeat BMP drawn at  PCP office on Thursday 08-11-2021 ? ?Stable for DC to home. ?Discussed with EDP. ?

## 2021-08-07 NOTE — Subjective & Objective (Signed)
CC: abnormal labs ?HPI: ?61 year old Caucasian female history of diabetes type 2 on insulin, hypertension, chronic hyponatremia, alcoholic cirrhosis, CKD stage IIIb presents to the ER with abnormal labs that were drawn today.  She had labs drawn drawn before her upcoming GI appointment with [facility].  This is a routine follow-up for her history of cirrhosis. ? ?Patient states that when she woke up today she thought that this is the best she has felt in a very long time.  She has been struggling with an ankle fracture and pain in her leg.  She states that today was the first day she did not have excruciating pain. ? ?She has been taking her medications as prescribed.  She states that she does get somewhat dizzy with standing up and feels lightheaded.  She states that her systolic blood pressures are running in the low 100s. ? ?She came to the ER after the GI office told her she needed to go to the ER because of abnormal labs. ? ?Labs in the office showed a sodium 121, potassium 5.8, chloride of 87, BUN of 34, creatinine 1.84 ? ?Repeat labs here in the ER sodium 122, potassium 5.5, chloride of 87, BUN of 31, creatinine 1.75, glucose of 313 ? ?White count 5.5, hemoglobin 11.3, platelets of 161 ? ?Ammonia was normal at 23 ? ?INR stable at 1.3 ? ?Urine sodium of 37, urine potassium of 12 ? ?UA showed specific gravity 1.006, 150 glucose, otherwise negative. ? ?[facility] hospitalist contacted for consultation. ?

## 2021-08-07 NOTE — ED Provider Notes (Signed)
?[facility] ?Provider Note ? ? ?CSN: [id number] ?Arrival date & time: 08/07/21  1609 ? ?  ? ?History ? ?Chief Complaint  ?Patient presents with  ? Abnormal Lab  ? ? ?Mallory Stewart is a 61 y.o. female. ? ? ?Abnormal Lab ?Patient presents for abnormal labs this morning.  Her medical history includes T2DM, HLD, HTN, neuropathy, alcoholic pancreatitis, anxiety, chronic pain, arthritis, malnutrition, alcoholic cirrhosis, portal hypertension, gastric varices.  Recent symptoms have included the following: She has had a cough of the past week which has been improving.  She still some intermittent dizziness with standing.  She feels "cloudy headed" at times.  Patient does continue to drink occasionally.  She states that her typical alcohol consumption is less than 1 day/week.  This morning, she underwent routine lab work and was informed that her sodium was low and her potassium was high.  She was instructed to come to ED.  She reports that she has been having a normal diet.  She has also been taking in adequate amounts of fluid.  Patient's husband, who accompanies her at bedside, states that she has had decreased appetite and decreased food intake.  Home potassium altering medications include spironolactone, losartan, Lasix. ?  ? ?Home Medications ?Prior to Admission medications   ?Medication Sig Start Date End Date Taking? Authorizing Provider  ?ALPRAZolam (XANAX) 0.25 MG tablet TAKE 1 TABLET(0.25 MG) BY MOUTH DAILY AS NEEDED FOR ANXIETY 03/24/21   Bedsole, Amy E, MD  ?amitriptyline (ELAVIL) 75 MG tablet Take 1 tablet (75 mg total) by mouth at bedtime. 03/24/21   [name], MD  ?aspirin EC 81 MG EC tablet Take 1 tablet (81 mg total) by mouth 2 (two) times daily. Swallow whole. 03/17/21   Jessy Oto, MD  ?Blood Glucose Monitoring Suppl (ONE TOUCH ULTRA 2) w/Device KIT Use to check blood sugars two times day 08/01/18   Eliezer Lofts E, MD  ?carvedilol (COREG) 12.5 MG tablet  TAKE 1 TABLET(12.5 MG) BY MOUTH TWICE DAILY WITH A MEAL 07/05/21   Gatha Mayer, MD  ?cetirizine (ZYRTEC) 10 MG tablet Take 10 mg by mouth at bedtime.    [provider]  ?empagliflozin (JARDIANCE) 25 MG TABS tablet Take 1 tablet (25 mg total) by mouth daily before breakfast. 03/24/21   Shamleffer, Melanie Crazier, MD  ?FEROSUL 325 (65 Fe) MG tablet TAKE 1 TABLET(325 MG) BY MOUTH TWICE DAILY WITH A MEAL 07/19/21   Jessy Oto, MD  ?furosemide (LASIX) 20 MG tablet Take 1 tablet (20 mg total) by mouth daily. 07/31/21   Gatha Mayer, MD  ?glipiZIDE (GLUCOTROL) 5 MG tablet Take 1 tablet (5 mg total) by mouth daily before breakfast. 07/24/21   Shamleffer, Melanie Crazier, MD  ?Insulin Glargine (BASAGLAR KWIKPEN) 100 UNIT/ML Inject 30 Units into the skin daily. 07/12/21   Shamleffer, Melanie Crazier, MD  ?Insulin Pen Needle 32G X 4 MM MISC 1 Device by Does not apply route daily in the afternoon. 03/24/21   Shamleffer, Melanie Crazier, MD  ?Lancets [facility] ULTRASOFT) lancets Use to check blood sugar two times a day. 08/01/18   Bedsole, Amy E, MD  ?losartan (COZAAR) 100 MG tablet TAKE 1 TABLET(100 MG) BY MOUTH DAILY 07/02/21   Bedsole, Amy E, MD  ?Multiple Vitamin (MULTIVITAMIN WITH MINERALS) TABS tablet Take 1 tablet by mouth daily. 07/25/18   Cristal Ford, DO  ?ONETOUCH ULTRA test strip CHECK BLOOD SUGAR TWO TIMES DAILY AS DIRECTED 03/23/20   Bedsole,  Amy E, MD  ?oxyCODONE (OXY IR/ROXICODONE) 5 MG immediate release tablet Take 1-2 tablets (5-10 mg total) by mouth 2 (two) times daily. 06/29/21   Jessy Oto, MD  ?pantoprazole (PROTONIX) 40 MG tablet TAKE 1 TABLET BY MOUTH TWICE DAILY BEFORE A MEAL 09/19/20   Gatha Mayer, MD  ?spironolactone (ALDACTONE) 50 MG tablet Take 1 tablet (50 mg total) by mouth daily. 07/31/21   Gatha Mayer, MD  ?   ? ?Allergies    ?Gluten meal and Zocor [simvastatin]   ? ?Review of Systems   ?Review of Systems  ?Respiratory:  Positive for cough.   ?Neurological:  Positive for  dizziness.  ?All other systems reviewed and are negative. ? ?Physical Exam ?Updated Vital Signs ?BP (!) 123/97   Pulse 89   Resp (!) 32   SpO2 100%  ?Physical Exam ?Vitals and nursing note reviewed.  ?Constitutional:   ?   General: She is not in acute distress. ?   Appearance: Normal appearance. She is well-developed and normal weight. She is not ill-appearing, toxic-appearing or diaphoretic.  ?HENT:  ?   Head: Normocephalic and atraumatic.  ?   Right Ear: External ear normal.  ?   Left Ear: External ear normal.  ?   Nose: Nose normal.  ?   Mouth/Throat:  ?   Mouth: Mucous membranes are moist.  ?   Pharynx: Oropharynx is clear.  ?Eyes:  ?   General: No scleral icterus. ?   Extraocular Movements: Extraocular movements intact.  ?   Conjunctiva/sclera: Conjunctivae normal.  ?Cardiovascular:  ?   Rate and Rhythm: Regular rhythm. Tachycardia present.  ?   Heart sounds: No murmur heard. ?Pulmonary:  ?   Effort: Pulmonary effort is normal. No respiratory distress.  ?   Breath sounds: Normal breath sounds. No wheezing or rales.  ?Abdominal:  ?   Palpations: Abdomen is soft.  ?   Tenderness: There is no abdominal tenderness.  ?Musculoskeletal:     ?   General: No swelling. Normal range of motion.  ?   Cervical back: Normal range of motion and neck supple. No rigidity.  ?   Comments: Cam boot over left lower extremity  ?Skin: ?   General: Skin is warm and dry.  ?   Capillary Refill: Capillary refill takes less than 2 seconds.  ?   Coloration: Skin is not jaundiced or pale.  ?Neurological:  ?   General: No focal deficit present.  ?   Mental Status: She is alert and oriented to person, place, and time.  ?   Cranial Nerves: No cranial nerve deficit.  ?   Sensory: No sensory deficit.  ?   Motor: No weakness.  ?   Coordination: Coordination normal.  ?Psychiatric:     ?   Mood and Affect: Mood normal.     ?   Behavior: Behavior normal.     ?   Thought Content: Thought content normal.     ?   Judgment: Judgment normal.  ? ? ?ED  Results / Procedures / Treatments   ?Labs ?(all labs ordered are listed, but only abnormal results are displayed) ?Labs Reviewed  ?COMPREHENSIVE METABOLIC PANEL - Abnormal; Notable for the following components:  ?    Result Value  ? Sodium 122 (*)   ? Potassium 5.5 (*)   ? Chloride 88 (*)   ? Glucose, Bld 313 (*)   ? BUN 31 (*)   ? Creatinine, Ser 1.75 (*)   ?  Alkaline Phosphatase 197 (*)   ? GFR, Estimated 33 (*)   ? All other components within normal limits  ?CBC WITH DIFFERENTIAL/PLATELET - Abnormal; Notable for the following components:  ? RBC 3.63 (*)   ? Hemoglobin 11.0 (*)   ? HCT 31.2 (*)   ? Platelets 149 (*)   ? All other components within normal limits  ?URINALYSIS, ROUTINE W REFLEX MICROSCOPIC - Abnormal; Notable for the following components:  ? Color, Urine STRAW (*)   ? Glucose, UA 150 (*)   ? All other components within normal limits  ?PROTIME-INR - Abnormal; Notable for the following components:  ? Prothrombin Time 15.7 (*)   ? INR 1.3 (*)   ? All other components within normal limits  ?BASIC METABOLIC PANEL - Abnormal; Notable for the following components:  ? Sodium 125 (*)   ? Chloride 92 (*)   ? Glucose, Bld 176 (*)   ? BUN 31 (*)   ? Creatinine, Ser 1.58 (*)   ? GFR, Estimated 38 (*)   ? All other components within normal limits  ?CBG MONITORING, ED - Abnormal; Notable for the following components:  ? Glucose-Capillary 154 (*)   ? All other components within normal limits  ?RESP PANEL BY RT-PCR (FLU A&B, COVID) ARPGX2  ?APTT  ?NA AND K (SODIUM & POTASSIUM), RAND UR  ?AMMONIA  ?TSH  ? ? ?EKG ?EKG Interpretation ? ?Date/Time:  Monday August 07 2021 16:23:04 EDT ?Ventricular Rate:  120 ?PR Interval:  146 ?QRS Duration: 82 ?QT Interval:  314 ?QTC Calculation: 443 ?R Axis:   69 ?Text Interpretation: Sinus tachycardia Low voltage QRS Nonspecific T wave abnormality Abnormal ECG When compared with ECG of 12-Mar-2021 01:44, PREVIOUS ECG IS PRESENT Confirmed by Godfrey Pick [phone]) on 08/07/2021 5:12:38  PM ? ?Radiology ?DG Chest Portable 1 View ? ?Result Date: 08/07/2021 ?CLINICAL DATA:  Cough EXAM: PORTABLE CHEST 1 VIEW COMPARISON:  03/12/2021 FINDINGS: The heart size and mediastinal contours are within normal limi

## 2021-08-07 NOTE — Consult Note (Signed)
?Hospitalist Consultation ?History and Physical  ? ? Mallory Stewart RCB:[id number] DOB: 12-05-1960 DOA: 08/07/2021 ? ?DOS: the patient was seen and examined on 08/07/2021 ? ?PCP: [name], MD  ? ?Patient coming from: Home ? ?I have personally briefly reviewed patient's old medical records in [place] ? ?CC: abnormal labs ?HPI: ?61 year old Caucasian female history of diabetes type 2 on insulin, hypertension, chronic hyponatremia, alcoholic cirrhosis, CKD stage IIIb presents to the ER with abnormal labs that were drawn today.  She had labs drawn drawn before her upcoming GI appointment with [facility].  This is a routine follow-up for her history of cirrhosis. ? ?Patient states that when she woke up today she thought that this is the best she has felt in a very long time.  She has been struggling with an ankle fracture and pain in her leg.  She states that today was the first day she did not have excruciating pain. ? ?She has been taking her medications as prescribed.  She states that she does get somewhat dizzy with standing up and feels lightheaded.  She states that her systolic blood pressures are running in the low 100s. ? ?She came to the ER after the GI office told her she needed to go to the ER because of abnormal labs. ? ?Labs in the office showed a sodium 121, potassium 5.8, chloride of 87, BUN of 34, creatinine 1.84 ? ?Repeat labs here in the ER sodium 122, potassium 5.5, chloride of 87, BUN of 31, creatinine 1.75, glucose of 313 ? ?White count 5.5, hemoglobin 11.3, platelets of 161 ? ?Ammonia was normal at 23 ? ?INR stable at 1.3 ? ?Urine sodium of 37, urine potassium of 12 ? ?UA showed specific gravity 1.006, 150 glucose, otherwise negative. ? ?[facility] hospitalist contacted for consultation.  ? ?ED Course: Repeat labs showed a sodium of 122, corrected to 125 for serum glucose of 313.  Repeat potassium 5.5, BUN of 31, creatinine 1.75 ? ?Review of Systems:  ?Review of Systems  ?Constitutional:  Negative.   ?HENT: Negative.    ?Eyes:  Positive for blurred vision.  ?     Recent seen by ophthalmology for cataracts  ?Respiratory: Negative.  Negative for shortness of breath.   ?Cardiovascular: Negative.  Negative for orthopnea, claudication, leg swelling and PND.  ?Gastrointestinal: Negative.  Negative for abdominal pain, nausea and vomiting.  ?Genitourinary: Negative.   ?Musculoskeletal:   ?     Patient states that today is the first day her right ankle has not had severe pain.  ?Skin: Negative.   ?Neurological:  Positive for dizziness.  ?     Dizziness with standing.  ?Endo/Heme/Allergies: Negative.   ?Psychiatric/Behavioral: Negative.    ?All other systems reviewed and are negative. ? ?Past Medical History:  ?Diagnosis Date  ? Alcohol-induced chronic pancreatitis ([place])   ? Alcoholic cirrhosis ([place])   ? B12 deficiency   ? Concussion   ? DDD (degenerative disc disease), cervical   ? Diabetes ([place])   ? DKA, type 2 ([place])   ? Gastric outlet obstruction 08/12/2018  ? Hypertension   ? Neuropathy   ? Pancreatic pseudocyst/cyst 05/06/2013  ? Seasonal allergies   ? ? ?Past Surgical History:  ?Procedure Laterality Date  ? ANKLE ARTHROSCOPY Right 12/20/2020  ? Procedure: RIGHT ANKLE ARTHROSCOPIC DEBRIDEMENT;  Surgeon: Newt Minion, MD;  Location: [place];  Service: Orthopedics;  Laterality: Right;  ? BIOPSY  01/19/2019  ? Procedure: BIOPSY;  Surgeon: Rush Landmark,  Telford Nab., MD;  Location: [facility] ENDOSCOPY;  Service: Gastroenterology;;  ? CHOLECYSTECTOMY N/A 07/28/2016  ? Procedure: LAPAROSCOPIC CHOLECYSTECTOMY WITH INTRAOPERATIVE CHOLANGIOGRAM;  Surgeon: Mickeal Skinner, MD;  Location: [place];  Service: General;  Laterality: N/A;  ? ESOPHAGOGASTRODUODENOSCOPY (EGD) WITH PROPOFOL N/A 08/13/2018  ? Procedure: ESOPHAGOGASTRODUODENOSCOPY (EGD) WITH PROPOFOL;  Surgeon: Rush Landmark Telford Nab., MD;  Location: [place];  Service: Gastroenterology;  Laterality: N/A;  ? ESOPHAGOGASTRODUODENOSCOPY (EGD) WITH  PROPOFOL N/A 01/19/2019  ? Procedure: ESOPHAGOGASTRODUODENOSCOPY (EGD) WITH PROPOFOL;  Surgeon: Rush Landmark Telford Nab., MD;  Location: [place];  Service: Gastroenterology;  Laterality: N/A;  ? IR PARACENTESIS  08/08/2020  ? IR PARACENTESIS  03/13/2021  ? LEEP  1990's  ? ORIF FOOT FRACTURE  09/2008  ? L 5th metatarsal  ? REFRACTIVE SURGERY  2000  ? TIBIA IM NAIL INSERTION Left 03/12/2021  ? Procedure: INTRAMEDULLARY (IM) NAIL TIBIAL;  Surgeon: Jessy Oto, MD;  Location: [place];  Service: Orthopedics;  Laterality: Left;  ? TONSILLECTOMY    ? UPPER ESOPHAGEAL ENDOSCOPIC ULTRASOUND (EUS) N/A 08/13/2018  ? Procedure: UPPER ESOPHAGEAL ENDOSCOPIC ULTRASOUND (EUS);  Surgeon: Irving Copas., MD;  Location: [place];  Service: Gastroenterology;  Laterality: N/A;  ? UPPER ESOPHAGEAL ENDOSCOPIC ULTRASOUND (EUS) N/A 01/19/2019  ? Procedure: UPPER ESOPHAGEAL ENDOSCOPIC ULTRASOUND (EUS);  Surgeon: Irving Copas., MD;  Location: [place];  Service: Gastroenterology;  Laterality: N/A;  ? ? ? reports that she has never smoked. She has never used smokeless tobacco. She reports that she does not currently use alcohol after a past usage of about 7.0 standard drinks per week. She reports that she does not use drugs. ? ?Allergies  ?Allergen Reactions  ? Gluten Meal Other (See Comments)  ?  Stomach distress  ? Zocor [Simvastatin] Other (See Comments)  ?  Elevated lfts?  ? ? ?Family History  ?Problem Relation Age of Onset  ? Other Mother   ?     tachycardia.Marland KitchenMarland Kitchen?afib  ? Stroke Father   ?     after hernia suegery  ? Prostate cancer Father   ? Other Father   ?     global transient amnesia, unclear source  ? Atrial fibrillation Sister   ? Healthy Brother   ? Healthy Brother   ? Coronary artery disease Paternal Grandmother   ? Heart attack Paternal Grandmother 41  ? Brain cancer Maternal Grandfather   ?     ?  ? Cancer Paternal Grandfather   ?     ?  ? Breast cancer Maternal Grandmother   ? ? ?Prior to Admission  medications   ?Medication Sig Start Date End Date Taking? Authorizing Provider  ?ALPRAZolam (XANAX) 0.25 MG tablet TAKE 1 TABLET(0.25 MG) BY MOUTH DAILY AS NEEDED FOR ANXIETY 03/24/21   Bedsole, Amy E, MD  ?amitriptyline (ELAVIL) 75 MG tablet Take 1 tablet (75 mg total) by mouth at bedtime. 03/24/21   [name], MD  ?aspirin EC 81 MG EC tablet Take 1 tablet (81 mg total) by mouth 2 (two) times daily. Swallow whole. 03/17/21   Jessy Oto, MD  ?Blood Glucose Monitoring Suppl (ONE TOUCH ULTRA 2) w/Device KIT Use to check blood sugars two times day 08/01/18   Eliezer Lofts E, MD  ?carvedilol (COREG) 12.5 MG tablet TAKE 1 TABLET(12.5 MG) BY MOUTH TWICE DAILY WITH A MEAL 07/05/21   Gatha Mayer, MD  ?cetirizine (ZYRTEC) 10 MG tablet Take 10 mg by mouth at bedtime.    [provider]  ?  empagliflozin (JARDIANCE) 25 MG TABS tablet Take 1 tablet (25 mg total) by mouth daily before breakfast. 03/24/21   Shamleffer, Melanie Crazier, MD  ?FEROSUL 325 (65 Fe) MG tablet TAKE 1 TABLET(325 MG) BY MOUTH TWICE DAILY WITH A MEAL 07/19/21   Jessy Oto, MD  ?furosemide (LASIX) 20 MG tablet Take 1 tablet (20 mg total) by mouth daily. 07/31/21   Gatha Mayer, MD  ?glipiZIDE (GLUCOTROL) 5 MG tablet Take 1 tablet (5 mg total) by mouth daily before breakfast. 07/24/21   Shamleffer, Melanie Crazier, MD  ?Insulin Glargine (BASAGLAR KWIKPEN) 100 UNIT/ML Inject 30 Units into the skin daily. 07/12/21   Shamleffer, Melanie Crazier, MD  ?Insulin Pen Needle 32G X 4 MM MISC 1 Device by Does not apply route daily in the afternoon. 03/24/21   Shamleffer, Melanie Crazier, MD  ?Lancets [facility] ULTRASOFT) lancets Use to check blood sugar two times a day. 08/01/18   Bedsole, Amy E, MD  ?losartan (COZAAR) 100 MG tablet TAKE 1 TABLET(100 MG) BY MOUTH DAILY 07/02/21   Bedsole, Amy E, MD  ?Multiple Vitamin (MULTIVITAMIN WITH MINERALS) TABS tablet Take 1 tablet by mouth daily. 07/25/18   Mikhail, Velta Addison, DO  ?ONETOUCH ULTRA test strip CHECK  BLOOD SUGAR TWO TIMES DAILY AS DIRECTED 03/23/20   Bedsole, Amy E, MD  ?oxyCODONE (OXY IR/ROXICODONE) 5 MG immediate release tablet Take 1-2 tablets (5-10 mg total) by mouth 2 (two) times daily. 06/29/21   Louanne Skye,

## 2021-08-07 NOTE — ED Notes (Signed)
Dr. Chen at bedside.

## 2021-08-07 NOTE — Assessment & Plan Note (Signed)
Hyperglycemia today. Will repeat CBG after IVF. If needed, will give some SQ insulin. ?

## 2021-08-08 ENCOUNTER — Telehealth: Payer: Self-pay | Admitting: Internal Medicine

## 2021-08-08 LAB — TSH: TSH: 3.323 u[IU]/mL (ref 0.350–4.500)

## 2021-08-08 NOTE — Telephone Encounter (Signed)
Pt questioned if she should keep OV with Dr. Leone Payor that is scheduled for tomorrow since she just went to the ED yesterday: Pt stated the ED doctors  canceled some of her medications and changed some: ?Pt was notified to keep the appointment with Dr. Leone Payor to update him on all the changes that have been made so that he can manage her care closely: ?Pt verbalized understanding with all questions answered.  ? ? ?

## 2021-08-08 NOTE — Telephone Encounter (Signed)
Patient returned your call.

## 2021-08-08 NOTE — Telephone Encounter (Signed)
Patient called to check with Dr. Leone Payor regarding follow up visit 08/09/21. Per patient, she went to ED yesterday. Patient wants to know if we want to see her still tomorrow or should she reschedule? Please advise. ?

## 2021-08-08 NOTE — Telephone Encounter (Signed)
Left message for pt to call back  °

## 2021-08-09 ENCOUNTER — Ambulatory Visit: Payer: 59 | Admitting: Internal Medicine

## 2021-08-09 ENCOUNTER — Other Ambulatory Visit (INDEPENDENT_AMBULATORY_CARE_PROVIDER_SITE_OTHER): Payer: 59

## 2021-08-09 ENCOUNTER — Other Ambulatory Visit: Payer: Self-pay

## 2021-08-09 ENCOUNTER — Encounter: Payer: Self-pay | Admitting: Internal Medicine

## 2021-08-09 VITALS — BP 108/60 | HR 92 | Ht 69.0 in | Wt 147.0 lb

## 2021-08-09 DIAGNOSIS — K838 Other specified diseases of biliary tract: Secondary | ICD-10-CM

## 2021-08-09 DIAGNOSIS — K860 Alcohol-induced chronic pancreatitis: Secondary | ICD-10-CM | POA: Diagnosis not present

## 2021-08-09 DIAGNOSIS — E875 Hyperkalemia: Secondary | ICD-10-CM

## 2021-08-09 DIAGNOSIS — K7031 Alcoholic cirrhosis of liver with ascites: Secondary | ICD-10-CM

## 2021-08-09 DIAGNOSIS — E871 Hypo-osmolality and hyponatremia: Secondary | ICD-10-CM

## 2021-08-09 LAB — BASIC METABOLIC PANEL
BUN: 30 mg/dL — ABNORMAL HIGH (ref 6–23)
CO2: 26 mEq/L (ref 19–32)
Calcium: 10.2 mg/dL (ref 8.4–10.5)
Chloride: 91 mEq/L — ABNORMAL LOW (ref 96–112)
Creatinine, Ser: 1.38 mg/dL — ABNORMAL HIGH (ref 0.40–1.20)
GFR: 42.18 mL/min — ABNORMAL LOW (ref >60.00)
Glucose, Bld: 175 mg/dL — ABNORMAL HIGH (ref 70–99)
Potassium: 5.3 mEq/L — ABNORMAL HIGH (ref 3.5–5.1)
Sodium: 125 mEq/L — ABNORMAL LOW (ref 135–145)

## 2021-08-09 NOTE — Progress Notes (Signed)
? ?Mallory Stewart 61 y.o. 03-10-1961 [id number] ? ?Assessment & Plan:  ? ?Encounter Diagnoses  ?Name Primary?  ? Alcoholic cirrhosis of liver with ascites ([place]) Yes  ? Chronic alcoholic pancreatitis ([place])   ? Dilated bile duct   ? Hyponatremia   ? Hyperkalemia   ? ?Reassess hyponatremia with a be met today. ?Leave on current medication regimen.  She is now off losartan, and spironolactone and on furosemide 20 mg every other day.  I appreciate [facility] internal medicine help in the ER recently.  I do suspect diuretics had a lot to do with her metabolic abnormalities. ? ?We will regroup after labs are in and she will need some primary care follow-up as well.  She is going to check to make sure she is taking carvedilol.  I will make a determination about office follow-up probably after the MRI (see below). ? ?We discussed how rare alcohol may not be toxic but also warned that it is a slippery slope to go back to regular alcohol use.  She feels like she can control this and will stick to very rare use. ? ?I am going to reassess her liver and pancreas with an MRI MRCP she also has this dilated bile duct which may be a choledochocele or just postcholecystectomy physiologic dilation.  MRI will be done within a couple of months.  She has upcoming cataract surgery. ? ?She should keep neurology follow-up regarding confusion episodes.  I do not think she has hepatic encephalopathy based upon my exam today and her recent low ammonia though that is not a perfect test altogether it seems like hepatic encephalopathy is not the case.  It is possible her hyponatremia could have been contributing.  She had an MRI last year with some atrophy, they thought was small vessel disease.  Have to consider the possibility of alcohol issues as well. ? ? ?CC: Mallory Sanders, MD ? ? ?Subjective:  ? ?Chief Complaint: Follow-up of cirrhosis and pancreatitis and hyponatremia, hyperkalemia ? ?HPI ?61 year old woman with alcoholic cirrhosis  complicated by ascites as well as alcoholic pancreatitis.  She came for labs this week and had significant hyponatremia with a serum sodium at 121 corrected to 122.  I referred her to the ER, she also had mild hyperkalemia.  Also worsening renal function.  She received excellent help with some IV fluids and lab work-up while there, her sodium came up to 125 and her glucose was even higher than so the corrected sodium was better.  Potassium was treated with Lokelma and spironolactone and losartan were stopped.  Changed to furosemide 20 mg every other day.  She has been having a few episodes of confusion of late and a neurology appointment is scheduled.  She would awaken and not know where she was or not know if her husband was there did not last forever.  She does not have excessive somnolence or sleepiness.  Neurology appointment pending soon.  Also going to have cataract surgery soon.  She is improving with respect to her left lower extremity/ankle fracture and is now up to 50% weightbearing.  No peripheral edema or abdominal swelling. ? ?Wt Readings from Last 3 Encounters:  ?08/09/21 147 lb (66.7 kg)  ?07/04/21 147 lb (66.7 kg)  ?06/29/21 150 lb (68 kg)  ? ?She did have 4 ounces of wine the other day at a birthday party but says that is the first in many many months and she was not tempted to drink further it was just  a special occasion rare event. ?Allergies  ?Allergen Reactions  ? Gluten Meal Other (See Comments)  ?  Stomach distress  ? Zocor [Simvastatin] Other (See Comments)  ?  Elevated lfts?  ? ?Current Meds  ?Medication Sig  ? ALPRAZolam (XANAX) 0.25 MG tablet TAKE 1 TABLET(0.25 MG) BY MOUTH DAILY AS NEEDED FOR ANXIETY  ? amitriptyline (ELAVIL) 75 MG tablet Take 1 tablet (75 mg total) by mouth at bedtime.  ? aspirin EC 81 MG EC tablet Take 1 tablet (81 mg total) by mouth 2 (two) times daily. Swallow whole.  ? Blood Glucose Monitoring Suppl (ONE TOUCH ULTRA 2) w/Device KIT Use to check blood sugars two times  day  ? carvedilol (COREG) 12.5 MG tablet TAKE 1 TABLET(12.5 MG) BY MOUTH TWICE DAILY WITH A MEAL  ? cetirizine (ZYRTEC) 10 MG tablet Take 10 mg by mouth at bedtime.  ? empagliflozin (JARDIANCE) 25 MG TABS tablet Take 1 tablet (25 mg total) by mouth daily before breakfast.  ? FEROSUL 325 (65 Fe) MG tablet TAKE 1 TABLET(325 MG) BY MOUTH TWICE DAILY WITH A MEAL  ? furosemide (LASIX) 20 MG tablet Take 1 tablet (20 mg total) by mouth daily.  ? glipiZIDE (GLUCOTROL) 5 MG tablet Take 1 tablet (5 mg total) by mouth daily before breakfast.  ? Insulin Glargine (BASAGLAR KWIKPEN) 100 UNIT/ML Inject 30 Units into the skin daily.  ? Insulin Pen Needle 32G X 4 MM MISC 1 Device by Does not apply route daily in the afternoon.  ? Lancets (ONETOUCH ULTRASOFT) lancets Use to check blood sugar two times a day.  ? Multiple Vitamin (MULTIVITAMIN WITH MINERALS) TABS tablet Take 1 tablet by mouth daily.  ? ONETOUCH ULTRA test strip CHECK BLOOD SUGAR TWO TIMES DAILY AS DIRECTED  ? oxyCODONE (OXY IR/ROXICODONE) 5 MG immediate release tablet Take 1-2 tablets (5-10 mg total) by mouth 2 (two) times daily.  ? pantoprazole (PROTONIX) 40 MG tablet TAKE 1 TABLET BY MOUTH TWICE DAILY BEFORE A MEAL  ? ?Past Medical History:  ?Diagnosis Date  ? Alcohol-induced chronic pancreatitis ([place])   ? Alcoholic cirrhosis ([place])   ? B12 deficiency   ? Concussion   ? DDD (degenerative disc disease), cervical   ? Diabetes ([place])   ? DKA, type 2 ([place])   ? Gastric outlet obstruction 08/12/2018  ? Hypertension   ? Neuropathy   ? Pancreatic pseudocyst/cyst 05/06/2013  ? Seasonal allergies   ? ?Past Surgical History:  ?Procedure Laterality Date  ? ANKLE ARTHROSCOPY Right 12/20/2020  ? Procedure: RIGHT ANKLE ARTHROSCOPIC DEBRIDEMENT;  Surgeon: Newt Minion, MD;  Location: [facility];  Service: Orthopedics;  Laterality: Right;  ? BIOPSY  01/19/2019  ? Procedure: BIOPSY;  Surgeon: Irving Copas., MD;  Location: [facility];  Service:  Gastroenterology;;  ? CHOLECYSTECTOMY N/A 07/28/2016  ? Procedure: LAPAROSCOPIC CHOLECYSTECTOMY WITH INTRAOPERATIVE CHOLANGIOGRAM;  Surgeon: Mickeal Skinner, MD;  Location: [facility];  Service: General;  Laterality: N/A;  ? ESOPHAGOGASTRODUODENOSCOPY (EGD) WITH PROPOFOL N/A 08/13/2018  ? Procedure: ESOPHAGOGASTRODUODENOSCOPY (EGD) WITH PROPOFOL;  Surgeon: Rush Landmark Telford Nab., MD;  Location: [facility];  Service: Gastroenterology;  Laterality: N/A;  ? ESOPHAGOGASTRODUODENOSCOPY (EGD) WITH PROPOFOL N/A 01/19/2019  ? Procedure: ESOPHAGOGASTRODUODENOSCOPY (EGD) WITH PROPOFOL;  Surgeon: Rush Landmark Telford Nab., MD;  Location: [facility];  Service: Gastroenterology;  Laterality: N/A;  ? IR PARACENTESIS  08/08/2020  ? IR PARACENTESIS  03/13/2021  ? LEEP  1990's  ? ORIF FOOT FRACTURE  09/2008  ? L 5th metatarsal  ?  REFRACTIVE SURGERY  2000  ? TIBIA IM NAIL INSERTION Left 03/12/2021  ? Procedure: INTRAMEDULLARY (IM) NAIL TIBIAL;  Surgeon: Jessy Oto, MD;  Location: [facility];  Service: Orthopedics;  Laterality: Left;  ? TONSILLECTOMY    ? UPPER ESOPHAGEAL ENDOSCOPIC ULTRASOUND (EUS) N/A 08/13/2018  ? Procedure: UPPER ESOPHAGEAL ENDOSCOPIC ULTRASOUND (EUS);  Surgeon: Irving Copas., MD;  Location: [facility];  Service: Gastroenterology;  Laterality: N/A;  ? UPPER ESOPHAGEAL ENDOSCOPIC ULTRASOUND (EUS) N/A 01/19/2019  ? Procedure: UPPER ESOPHAGEAL ENDOSCOPIC ULTRASOUND (EUS);  Surgeon: Irving Copas., MD;  Location: [facility];  Service: Gastroenterology;  Laterality: N/A;  ? ?Social History  ? ?Social History Narrative  ? Patient is married one stepson   ? 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 regular exercise , walking 4-5 times a week  ? Diet fruits and veggies, water  ? Alcohol at least several glasses of wine a week Stopped 08/2018  ? Never smoker, no drug use  ? ?family history includes Atrial fibrillation in her  sister; Brain cancer in her maternal grandfather; Breast cancer in her maternal grandmother; Cancer in her paternal grandfather; Coronary artery disease in her paternal grandmother; Healthy in her brother and brother; Heart att

## 2021-08-09 NOTE — Patient Instructions (Addendum)
Your provider has requested that you go to the basement level for lab work before leaving today. Press "B" on the elevator. The lab is located at the first door on the left as you exit the elevator. ? ?Due to recent changes in healthcare laws, you may see the results of your imaging and laboratory studies on MyChart before your provider has had a chance to review them.  We understand that in some cases there may be results that are confusing or concerning to you. Not all laboratory results come back in the same time frame and the provider may be waiting for multiple results in order to interpret others.  Please give [place] 48 hours in order for your provider to thoroughly review all the results before contacting the office for clarification of your results.  ? ?You have been scheduled for an MRI /MRCP at [facility] on 10/09/2021. Your appointment time is 8:00am. Please arrive 30 minutes prior to your appointment time for registration purposes. Please make certain not to have anything to eat or drink 6 hours prior to your test. In addition, if you have any metal in your body, have a pacemaker or defibrillator, please be sure to let your ordering physician know. This test typically takes 45 minutes to 1 hour to complete. Should you need to reschedule, please call [phone] to do so. ? ?Let us know if you are on the carvedilol please. ? ?Emmit Alexanders with your eye surgeries coming up. ? ?I appreciate the opportunity to care for you. ?Silvano Rusk, MD, [facility] ?

## 2021-08-10 ENCOUNTER — Encounter: Payer: Self-pay | Admitting: Specialist

## 2021-08-10 ENCOUNTER — Ambulatory Visit (INDEPENDENT_AMBULATORY_CARE_PROVIDER_SITE_OTHER): Payer: 59

## 2021-08-10 ENCOUNTER — Ambulatory Visit (INDEPENDENT_AMBULATORY_CARE_PROVIDER_SITE_OTHER): Payer: 59 | Admitting: Specialist

## 2021-08-10 VITALS — BP 109/70 | HR 106 | Ht 69.0 in | Wt 150.0 lb

## 2021-08-10 DIAGNOSIS — S82242A Displaced spiral fracture of shaft of left tibia, initial encounter for closed fracture: Secondary | ICD-10-CM

## 2021-08-10 NOTE — Progress Notes (Signed)
? ?Office Visit Note ?  ?Patient: Mallory Stewart           ?Date of Birth: [birth year]           ?MRN: [id number] ?Visit Date: 08/10/2021 ?             ?Requested by: [name], MD ?[place] ?[place],   [id number] ?PCP: [name], MD ? ? ?Assessment & Plan: ?Visit Diagnoses:  ?1. Displaced spiral fracture of shaft of left tibia, initial encounter for closed fracture   ? ? ?Plan: May weight bear as tolerated on the left leg. Discontinue the use of the left ankle brace. ? ?Follow-Up Instructions: No follow-ups on file.  ? ?Orders:  ?Orders Placed This Encounter  ?Procedures  ?? XR Tibia/Fibula Left  ? ?No orders of the defined types were placed in this encounter. ? ? ? ? Procedures: ?No procedures performed ? ? ?Clinical Data: ?No additional findings. ? ? ?Subjective: ?Chief Complaint  ?Patient presents with  ?? Left Leg - Follow-up, Fracture  ? ? ?HPI ? ?Review of Systems  ?Constitutional: Negative.   ?HENT: Negative.    ?Eyes: Negative.   ?Respiratory: Negative.    ?Cardiovascular: Negative.   ?Gastrointestinal: Negative.   ?Endocrine: Negative.   ?Genitourinary: Negative.   ?Musculoskeletal: Negative.   ?Skin: Negative.   ?Allergic/Immunologic: Negative.   ?Neurological: Negative.   ?Hematological: Negative.   ?Psychiatric/Behavioral: Negative.    ? ? ?Objective: ?Vital Signs: BP 109/70 (BP Location: Left Arm, Patient Position: Sitting)   Pulse (!) 106   Ht 5\' 9"  (1.753 m)   Wt 150 lb (68 kg)   BMI 22.15 kg/m?  ? ?Physical Exam ?Constitutional:   ?   Appearance: She is well-developed.  ?HENT:  ?   Head: Normocephalic and atraumatic.  ?Eyes:  ?   Pupils: Pupils are equal, round, and reactive to light.  ?Pulmonary:  ?   Effort: Pulmonary effort is normal.  ?   Breath sounds: Normal breath sounds.  ?Abdominal:  ?   General: Bowel sounds are normal.  ?   Palpations: Abdomen is soft.  ?Musculoskeletal:  ?   Cervical back: Normal range of motion and neck supple.  ?Skin: ?   General: Skin is  warm and dry.  ?Neurological:  ?   Mental Status: She is alert and oriented to person, place, and time.  ?Psychiatric:     ?   Behavior: Behavior normal.     ?   Thought Content: Thought content normal.     ?   Judgment: Judgment normal.  ? ?Right Ankle Exam  ?Right ankle exam is normal. ? ?Range of Motion  ?Dorsiflexion:  10  ?Plantar flexion:  35  ?Eversion:  25  ?Inversion:  20  ? ?Muscle Strength  ?Dorsiflexion:  5/5 ?Plantar flexion:  5/5 ?Anterior tibial:  5/5 ?Gastrocsoleus:  5/5 ?Peroneal muscle:  5/5 ? ?Other  ?Scars: present  ? ? ?Left Ankle Exam  ?Swelling: mild ? ?Range of Motion  ?Dorsiflexion:  abnormal  ?Plantar flexion:  abnormal  ?Eversion:  abnormal  ?Inversion:  abnormal  ? ?Muscle Strength  ?Dorsiflexion:  5/5  ?Plantar flexion:  5/5  ?Anterior tibial:  5/5  ? ?Other  ?Erythema: absent ?Scars: absent ? ? ? ?Specialty Comments:  ?No specialty comments available. ? ?Imaging: ?XR Tibia/Fibula Left ? ?Result Date: 08/10/2021 ?Ap andl lateral radiographs of the left tibia show the intermedullary nail and interlocking screws in good position and alignment.   ? ? ?  PMFS History: ?Patient Active Problem List  ? Diagnosis Date Noted  ?? Closed fracture of distal end of left tibia, initial encounter 03/12/2021  ?  Priority: High  ?  Class: Acute  ?? Closed fracture of proximal fibula 03/12/2021  ?  Priority: Medium   ?  Class: Acute  ?? Stage 3b chronic kidney disease (CKD) (HCC) - Baseline creatinine approximately 1.7 08/07/2021  ?? Balance problem 07/04/2021  ?? Confusion 07/04/2021  ?? Vision changes 07/04/2021  ?? H/O multiple concussions 07/04/2021  ?? Type 2 diabetes mellitus with hyperglycemia, with long-term current use of insulin ([place]) 03/24/2021  ?? Secondary esophageal varices without bleeding ([place])   ?? Displaced spiral fracture of shaft of left tibia, initial encounter for closed fracture 03/12/2021  ?? Impingement of right ankle joint   ?? Gastric varices without bleeding 12/06/2020  ?? Portal  hypertension ([place]) 09/30/2020  ?? Dilated bile duct 09/30/2020  ?? Alcoholic cirrhosis of liver with ascites ([place]) 09/29/2020  ?? Right foot pain 09/17/2020  ?? Laceration of lesser toe of right foot without foreign body present or damage to nail 09/17/2020  ?? Frequent falls 09/14/2020  ?? Closed fracture of ankle 09/14/2020  ?? Foot sprain, right, subsequent encounter 08/26/2020  ?? Sprain of anterior talofibular ligament of right ankle 08/26/2020  ?? Splenomegaly 08/09/2020  ?? Malnutrition of moderate degree ([place]) 08/08/2020  ?? Chronic hyponatremia 08/06/2020  ?? Transaminitis 08/06/2020  ?? Serum total bilirubin elevated 08/06/2020  ?? Fall at home, initial encounter 08/06/2020  ?? Generalized weakness 08/05/2020  ?? Idiopathic peripheral neuropathy 07/13/2020  ?? Type 2 diabetes mellitus with diabetic polyneuropathy, with long-term current use of insulin ([place]) 07/13/2020  ?? Persistent cough for 3 weeks or longer 07/04/2020  ?? Low HDL (under 40) 06/21/2020  ?? Post-nasal drip 06/21/2020  ?? Allergic rhinitis 06/21/2020  ?? Elevated alkaline phosphatase level 06/21/2020  ?? Duodenitis - edema ? andgioedema 10/30/2018  ?? Chronic alcoholic pancreatitis ([place]) 09/02/2018  ?? Diabetes mellitus with circulatory complication, HTN ([place]) 123456  ?? Dizziness 12/26/2017  ?? Chronic pain 05/17/2017  ?? Osteoarthritis of spine with radiculopathy, cervical region 09/17/2016  ?? Hyperkalemia 03/02/2016  ?? B12 deficiency 10/21/2014  ?? Neuropathic pain of both legs 02/11/2014  ?? Hepatic steatosis 04/27/2013  ?? GAD (generalized anxiety disorder) 09/23/2009  ?? Hyperlipidemia associated with type 2 diabetes mellitus ([place]) 11/03/2008  ?? Hypertension associated with diabetes ([place]) 09/29/2008  ?? PAP SMEAR, ABNORMAL 09/29/2008  ? ?Past Medical History:  ?Diagnosis Date  ?? Alcohol-induced chronic pancreatitis ([place])   ?? Alcoholic cirrhosis ([place])   ?? B12 deficiency   ?? Concussion   ?? DDD (degenerative disc disease),  cervical   ?? Diabetes ([place])   ?? DKA, type 2 ([place])   ?? Gastric outlet obstruction 08/12/2018  ?? Hypertension   ?? Neuropathy   ?? Pancreatic pseudocyst/cyst 05/06/2013  ?? Seasonal allergies   ?  ?Family History  ?Problem Relation Age of Onset  ?? Other Mother   ?     tachycardia.Marland KitchenMarland Kitchen?afib  ?? Stroke Father   ?     after hernia suegery  ?? Prostate cancer Father   ?? Other Father   ?     global transient amnesia, unclear source  ?? Atrial fibrillation Sister   ?? Healthy Brother   ?? Healthy Brother   ?? Coronary artery disease Paternal Grandmother   ?? Heart attack Paternal Grandmother 46  ?? Brain cancer Maternal Grandfather   ?     ?  ?? Cancer  Paternal Grandfather   ?     ?  ?? Breast cancer Maternal Grandmother   ?  ?Past Surgical History:  ?Procedure Laterality Date  ?? ANKLE ARTHROSCOPY Right 12/20/2020  ? Procedure: RIGHT ANKLE ARTHROSCOPIC DEBRIDEMENT;  Surgeon: Newt Minion, MD;  Location: [facility];  Service: Orthopedics;  Laterality: Right;  ?? BIOPSY  01/19/2019  ? Procedure: BIOPSY;  Surgeon: Irving Copas., MD;  Location: [facility];  Service: Gastroenterology;;  ?? CHOLECYSTECTOMY N/A 07/28/2016  ? Procedure: LAPAROSCOPIC CHOLECYSTECTOMY WITH INTRAOPERATIVE CHOLANGIOGRAM;  Surgeon: Mickeal Skinner, MD;  Location: [facility];  Service: General;  Laterality: N/A;  ?? ESOPHAGOGASTRODUODENOSCOPY (EGD) WITH PROPOFOL N/A 08/13/2018  ? Procedure: ESOPHAGOGASTRODUODENOSCOPY (EGD) WITH PROPOFOL;  Surgeon: Rush Landmark Telford Nab., MD;  Location: [facility];  Service: Gastroenterology;  Laterality: N/A;  ?? ESOPHAGOGASTRODUODENOSCOPY (EGD) WITH PROPOFOL N/A 01/19/2019  ? Procedure: ESOPHAGOGASTRODUODENOSCOPY (EGD) WITH PROPOFOL;  Surgeon: Rush Landmark Telford Nab., MD;  Location: [facility];  Service: Gastroenterology;  Laterality: N/A;  ?? IR PARACENTESIS  08/08/2020  ?? IR PARACENTESIS  03/13/2021  ?? LEEP  1990's  ?? ORIF FOOT FRACTURE  09/2008  ? L 5th metatarsal  ?? REFRACTIVE SURGERY   2000  ?? TIBIA IM NAIL INSERTION Left 03/12/2021  ? Procedure: INTRAMEDULLARY (IM) NAIL TIBIAL;  Surgeon: Jessy Oto, MD;  Location: [facility];  Service: Orthopedics;  Laterality: Left;  ?? TONSILLECTOMY

## 2021-08-14 ENCOUNTER — Encounter: Payer: Self-pay | Admitting: Physician Assistant

## 2021-08-14 ENCOUNTER — Ambulatory Visit: Payer: 59 | Admitting: Physician Assistant

## 2021-08-14 VITALS — BP 135/85 | HR 86 | Resp 18 | Ht 69.0 in | Wt 157.0 lb

## 2021-08-14 DIAGNOSIS — R413 Other amnesia: Secondary | ICD-10-CM | POA: Diagnosis not present

## 2021-08-14 DIAGNOSIS — R4182 Altered mental status, unspecified: Secondary | ICD-10-CM | POA: Diagnosis not present

## 2021-08-14 NOTE — Patient Instructions (Addendum)
It was a pleasure to see you today at our office.  ? ?Recommendations: ? ?MRI of the brain, the radiology office will call you to arrange you appointment ?Follow up pending on the results of the MRI ?EEG ?Continue checking labs to monitor sugars, sodium and ammonia ?Go over the meds that may affect your sodium  with your primary ?Increase your water intake  ? ?RECOMMENDATIONS FOR ALL PATIENTS WITH MEMORY PROBLEMS: ?1. Continue to exercise (Recommend 30 minutes of walking everyday, or 3 hours every week) ?2. Increase social interactions - continue going to [place] and enjoy social gatherings with friends and family ?3. Eat healthy, avoid fried foods and eat more fruits and vegetables ?4. Maintain adequate blood pressure, blood sugar, and blood cholesterol level. Reducing the risk of stroke and cardiovascular disease also helps promoting better memory. ?5. Avoid stressful situations. Live a simple life and avoid aggravations. Organize your time and prepare for the next day in anticipation. ?6. Sleep well, avoid any interruptions of sleep and avoid any distractions in the bedroom that may interfere with adequate sleep quality ?7. Avoid sugar, avoid sweets as there is a strong link between excessive sugar intake, diabetes, and cognitive impairment ?We discussed the Mediterranean diet, which has been shown to help patients reduce the risk of progressive memory disorders and reduces cardiovascular risk. This includes eating fish, eat fruits and green leafy vegetables, nuts like almonds and hazelnuts, walnuts, and also use olive oil. Avoid fast foods and fried foods as much as possible. Avoid sweets and sugar as sugar use has been linked to worsening of memory function. ? ?There is always a concern of gradual progression of memory problems. If this is the case, then we may need to adjust level of care according to patient needs. Support, both to the patient and caregiver, should then be put into place.  ? ? ?FALL  PRECAUTIONS: Be cautious when walking. Scan the area for obstacles that may increase the risk of trips and falls. When getting up in the mornings, sit up at the edge of the bed for a few minutes before getting out of bed. Consider elevating the bed at the head end to avoid drop of blood pressure when getting up. Walk always in a well-lit room (use night lights in the walls). Avoid area rugs or power cords from appliances in the middle of the walkways. Use a walker or a cane if necessary and consider physical therapy for balance exercise. Get your eyesight checked regularly. ? ?FINANCIAL OVERSIGHT: Supervision, especially oversight when making financial decisions or transactions is also recommended. ? ?HOME SAFETY: Consider the safety of the kitchen when operating appliances like stoves, microwave oven, and blender. Consider having supervision and share cooking responsibilities until no longer able to participate in those. Accidents with firearms and other hazards in the house should be identified and addressed as well. ? ? ?ABILITY TO BE LEFT ALONE: If patient is unable to contact 911 operator, consider using [facility], or when the need is there, arrange for someone to stay with patients. Smoking is a fire hazard, consider supervision or cessation. Risk of wandering should be assessed by caregiver and if detected at any point, supervision and safe proof recommendations should be instituted. ? ?MEDICATION SUPERVISION: Inability to self-administer medication needs to be constantly addressed. Implement a mechanism to ensure safe administration of the medications. ? ? ?DRIVING: Regarding driving, in patients with progressive memory problems, driving will be impaired. We advise to have someone else do the driving if  trouble finding directions or if minor accidents are reported. Independent driving assessment is available to determine safety of driving. ? ? ?If you are interested in the driving assessment, you can contact  the following: ? ?[facility] in [place] [phone] ? ?[facility] [phone] ? ?[facility] [phone] ? ?[facility] [phone] or [phone] ? ? ? ?Mediterranean Diet ?A Mediterranean diet refers to food and lifestyle choices that are based on the traditions of countries located on the [place]. This way of eating has been shown to help prevent certain conditions and improve outcomes for people who have chronic diseases, like kidney disease and heart disease. ?What are tips for following this plan? ?Lifestyle  ?Cook and eat meals together with your family, when possible. ?Drink enough fluid to keep your urine clear or pale yellow. ?Be physically active every day. This includes: ?Aerobic exercise like running or swimming. ?Leisure activities like gardening, walking, or housework. ?Get 7-8 hours of sleep each night. ?If recommended by your health care provider, drink red wine in moderation. This means 1 glass a day for nonpregnant women and 2 glasses a day for men. A glass of wine equals 5 oz (150 mL). ?Reading food labels  ?Check the serving size of packaged foods. For foods such as rice and pasta, the serving size refers to the amount of cooked product, not dry. ?Check the total fat in packaged foods. Avoid foods that have saturated fat or trans fats. ?Check the ingredients list for added sugars, such as corn syrup. ?Shopping  ?At the grocery store, buy most of your food from the areas near the walls of the store. This includes: ?Fresh fruits and vegetables (produce). ?Grains, beans, nuts, and seeds. Some of these may be available in unpackaged forms or large amounts (in bulk). ?Fresh seafood. ?Poultry and eggs. ?Low-fat dairy products. ?Buy whole ingredients instead of prepackaged foods. ?Buy fresh fruits and vegetables in-season from local farmers markets. ?Buy frozen fruits and vegetables in resealable bags. ?If you do not have access to  quality fresh seafood, buy precooked frozen shrimp or canned fish, such as tuna, salmon, or sardines. ?Buy small amounts of raw or cooked vegetables, salads, or olives from the deli or salad bar at your store. ?Stock your pantry so you always have certain foods on hand, such as olive oil, canned tuna, canned tomatoes, rice, pasta, and beans. ?Cooking  ?Cook foods with extra-virgin olive oil instead of using butter or other vegetable oils. ?Have meat as a side dish, and have vegetables or grains as your main dish. This means having meat in small portions or adding small amounts of meat to foods like pasta or stew. ?Use beans or vegetables instead of meat in common dishes like chili or lasagna. ?Experiment with different cooking methods. Try roasting or broiling vegetables instead of steaming or saut?eing them. ?Add frozen vegetables to soups, stews, pasta, or rice. ?Add nuts or seeds for added healthy fat at each meal. You can add these to yogurt, salads, or vegetable dishes. ?Marinate fish or vegetables using olive oil, lemon juice, garlic, and fresh herbs. ?Meal planning  ?Plan to eat 1 vegetarian meal one day each week. Try to work up to 2 vegetarian meals, if possible. ?Eat seafood 2 or more times a week. ?Have healthy snacks readily available, such as: ?Vegetable sticks with hummus. ?[place] yogurt. ?Fruit and nut trail mix. ?Eat balanced meals throughout the week. This includes: ?Fruit: 2-3 servings a day ?Vegetables: 4-5 servings a day ?  Low-fat dairy: 2 servings a day ?Fish, poultry, or lean meat: 1 serving a day ?Beans and legumes: 2 or more servings a week ?Nuts and seeds: 1-2 servings a day ?Whole grains: 6-8 servings a day ?Extra-virgin olive oil: 3-4 servings a day ?Limit red meat and sweets to only a few servings a month ?What are my food choices? ?Mediterranean diet ?Recommended ?Grains: Whole-grain pasta. Brown rice. Bulgar wheat. Polenta. Couscous. Whole-wheat bread. [place] Cobbatmeal. Quinoa. ?Vegetables:  Artichokes. Beets. Broccoli. Cabbage. Carrots. Eggplant. Green beans. Chard. Kale. Spinach. Onions. Leeks. Peas. Squash. Tomatoes. Peppers. Radishes. ?Fruits: Apples. Apricots. Avocado. Berries. Bananas. Cherries. Dates.

## 2021-08-14 NOTE — Progress Notes (Signed)
? ? ?Assessment/Plan:  ? ?Mallory Stewart is a very pleasant 61 y.o. year old RH female with  a history of hypertension, hyperlipidemia, anxiety, depression, ETOH abuse, DM2 with neuropathy, alcoholic cirrhosis with ascites, chronic alcoholic pancreatitis, cataracts, h/o post concussion syndrome 2017, insomnia, Iron deficiency anemia, arthritis, CKD3, recent bilateral tibial fractures requiring rehab, seen today for evaluation of memory difficulties and intermittent confusion.  MoCA today is 30/30.  In today's visit, her memory is normal, she is able to have a conversation, no confusion is noted.  Suspect memory issues may be multifactorial, especially since recently being hospitalized with hyponatremia and elevated ammonia. ? ? Recommendations:  ? ?Memory Difficulties  ? ?MRI brain with/without contrast to assess for underlying structural abnormality and assess vascular load  ?EEG to rule out seixure ?Discussed safety both in and out of the home.  ?Discussed the importance of regular daily schedule with inclusion of crossword puzzles to maintain brain function.  ?Continue to monitor mood with PCP.  ?Stay active at least 30 minutes at least 3 times a week.  Continue rehab. ?Naps should be scheduled and should be no longer than 60 minutes and should not occur after 2 PM.  ?Control cardiovascular risk factors  ?Mediterranean diet is recommended  ?Folllow up pending on the results of the MRI ? ?Subjective:  ? ? ?The patient is seen in neurologic consultation at the request of [name], MD for the evaluation of memory.  The patient is here alone.  This is a 61 y.o. year old RH  female who has recently noted to have increased confusion.  She reports that her memory is foggy at times, and she states that her cataracts contribute to her fogginess.  She denies repeating herself, or being disoriented when walking into her room at this time, however, 2 months ago she had an episode in which she did not know where  she was inside of her own house, asking to herself "where is my husband".  In addition, she had to episodes of amnesia, 1 in the week and prior to Christmas and another one on Christmas Day, and she is concerned of TGA.  At the time, she states that she was opening present, and then the next thing she remembers is a stack of clothing next to her, with family members watching her, she believes that several hours had passed by in between.  She has a history of alcohol abuse, however, she denies having had heavy alcohol consumption during those events.  Her actual alcohol intake at this time is of 1 glass of wine a week.  She denies leaving objects in unusual places.  She has difficulty ambulating after her bilateral fracture, she is recuperating from the displaced spiral fracture of the shaft of the left tibia, and is using a walker.  In the past, she had multiple falls, as well as a trauma to the head, with a CT at the time negative for acute intracranial abnormality or significant interval change.  At the time, stable mild atrophy and white matter disease was noted, which likely reflected a sequela of chronic microvascular ischemia.  She denies wandering behavior.  She does not drive because of her mobility issues.  Her mood is stable at this time, denies worsening depression.  She denies any irritability.  She reports sleeping "suckey" and does have vivid dreams, but denies any nightmares, sleepwalking, hallucinations or paranoia.  There are no hygiene concerns.  She is independent of bathing and dressing.  She is in charge of her own medications and finances, denies missing any.  Her appetite is good, denies any trouble swallowing.  She does not cook very often, denies leaving the stove on.  She denies any headaches, double vision, dizziness, focal numbness or tingling, or anosmia.  Denies history of seizures.  Denies urine incontinence, retention, constipation or diarrhea.  Denies a history of sleep apnea, or  tobacco.  Family history remarkable for dementia in her father of unknown type. ? ? ? Labs 08/07/21 TSH 1.24 Ammonia 23 (was higher 1 mo ago 39) Na 125 (was 121 1 wk prior), Cr 1.38 . B12 260  ? ?Allergies  ?Allergen Reactions  ? Gluten Meal Other (See Comments)  ?  Stomach distress  ? Zocor [Simvastatin] Other (See Comments)  ?  Elevated lfts?  ? ? ?Current Outpatient Medications  ?Medication Instructions  ? ALPRAZolam (XANAX) 0.25 MG tablet TAKE 1 TABLET(0.25 MG) BY MOUTH DAILY AS NEEDED FOR ANXIETY  ? amitriptyline (ELAVIL) 75 mg, Oral, Daily at bedtime  ? aspirin 81 mg, Oral, 2 times daily, Swallow whole.  ? Basaglar KwikPen 30 Units, Subcutaneous, Daily  ? Blood Glucose Monitoring Suppl (ONE TOUCH ULTRA 2) w/Device KIT Use to check blood sugars two times day  ? carvedilol (COREG) 12.5 MG tablet TAKE 1 TABLET(12.5 MG) BY MOUTH TWICE DAILY WITH A MEAL  ? cetirizine (ZYRTEC) 10 mg, Oral, Daily at bedtime  ? empagliflozin (JARDIANCE) 25 mg, Oral, Daily before breakfast  ? FEROSUL 325 (65 Fe) MG tablet TAKE 1 TABLET(325 MG) BY MOUTH TWICE DAILY WITH A MEAL  ? furosemide (LASIX) 20 mg, Oral, Daily  ? glipiZIDE (GLUCOTROL) 5 mg, Oral, Daily before breakfast  ? Insulin Pen Needle 32G X 4 MM MISC 1 Device, Does not apply, Daily  ? Lancets (ONETOUCH ULTRASOFT) lancets Use to check blood sugar two times a day.  ? Multiple Vitamin (MULTIVITAMIN WITH MINERALS) TABS tablet 1 tablet, Oral, Daily  ? ONETOUCH ULTRA test strip CHECK BLOOD SUGAR TWO TIMES DAILY AS DIRECTED  ? oxyCODONE (OXY IR/ROXICODONE) 5-10 mg, Oral, 2 times daily  ? pantoprazole (PROTONIX) 40 MG tablet TAKE 1 TABLET BY MOUTH TWICE DAILY BEFORE A MEAL  ? ? ? ?VITALS:   ?Vitals:  ? 08/14/21 1012  ?BP: 135/85  ?Pulse: 86  ?Resp: 18  ?SpO2: 100%  ?Weight: 157 lb (71.2 kg)  ?Height: _0  (1.753 m)  ? ? ?PHYSICAL EXAM  ? ?HEENT:  Normocephalic, atraumatic. The mucous membranes are moist. The superficial temporal arteries are without ropiness or  tenderness. ?Cardiovascular: Regular rate and rhythm. ?Lungs: Clear to auscultation bilaterally. ?Neck: There are no carotid bruits noted bilaterally. ? ?NEUROLOGICAL: ? ?  08/15/2021  ?  7:00 AM  ?Montreal Cognitive Assessment   ?Visuospatial/ Executive (0/5) 5  ?Naming (0/3) 3  ?Attention: Read list of digits (0/2) 2  ?Attention: Read list of letters (0/1) 1  ?Attention: Serial 7 subtraction starting at 100 (0/3) 3  ?Language: Repeat phrase (0/2) 2  ?Language : Fluency (0/1) 1  ?Abstraction (0/2) 2  ?Delayed Recall (0/5) 5  ?Orientation (0/6) 6  ?Total 30  ?Adjusted Score (based on education) 30  ?  ? ?  11/30/2015  ?  9:00 AM  ?MMSE - Mini Mental State Exam  ?Orientation to time 5  ?Orientation to Place 5  ?Registration 3  ?Attention/ Calculation 5  ?Recall 3  ?Language- name 2 objects 2  ?Language- repeat 1  ?Language- follow 3 step command 3  ?  Language- read & follow direction 1  ?Write a sentence 1  ?Copy design 1  ?Total score 30  ?  ? ?Orientation:  Alert and oriented to person, place and time. No aphasia or dysarthria. Fund of knowledge is appropriate. Recent memory impaired and remote memory intact.  Attention and concentration are normal.  Able to name objects and repeat phrases. Delayed recall   ?Cranial nerves: There is good facial symmetry. Extraocular muscles are intact and visual fields are full to confrontational testing. Speech is fluent and clear. Soft palate rises symmetrically and there is no tongue deviation. Hearing is intact to conversational tone. ?Tone: Tone is good throughout. ?Sensation: Sensation is intact to light touch and pinprick throughout. Vibration is intact at the bilateral big toe.There is no extinction with double simultaneous stimulation. There is no sensory dermatomal level identified. ?Coordination: The patient has no difficulty with RAM's or FNF bilaterally. Normal finger to nose  ?Motor: Strength is 5/5 in the bilateral upper and lower extremities. There is no pronator  drift. There are no fasciculations noted. ?DTR's: Deep tendon reflexes are 2/4 at the bilateral biceps, triceps, brachioradialis, patella and achilles.  Plantar responses are downgoing bilaterally. ?Gait and Station: The patient is

## 2021-08-15 ENCOUNTER — Encounter: Payer: Self-pay | Admitting: Physical Therapy

## 2021-08-15 ENCOUNTER — Telehealth: Payer: Self-pay | Admitting: Internal Medicine

## 2021-08-15 ENCOUNTER — Ambulatory Visit (INDEPENDENT_AMBULATORY_CARE_PROVIDER_SITE_OTHER): Payer: 59 | Admitting: Physical Therapy

## 2021-08-15 ENCOUNTER — Other Ambulatory Visit (INDEPENDENT_AMBULATORY_CARE_PROVIDER_SITE_OTHER): Payer: 59

## 2021-08-15 ENCOUNTER — Other Ambulatory Visit: Payer: Self-pay

## 2021-08-15 DIAGNOSIS — R262 Difficulty in walking, not elsewhere classified: Secondary | ICD-10-CM

## 2021-08-15 DIAGNOSIS — M25572 Pain in left ankle and joints of left foot: Secondary | ICD-10-CM | POA: Diagnosis not present

## 2021-08-15 DIAGNOSIS — M6281 Muscle weakness (generalized): Secondary | ICD-10-CM | POA: Diagnosis not present

## 2021-08-15 DIAGNOSIS — K860 Alcohol-induced chronic pancreatitis: Secondary | ICD-10-CM | POA: Diagnosis not present

## 2021-08-15 DIAGNOSIS — K838 Other specified diseases of biliary tract: Secondary | ICD-10-CM

## 2021-08-15 DIAGNOSIS — K7031 Alcoholic cirrhosis of liver with ascites: Secondary | ICD-10-CM | POA: Diagnosis not present

## 2021-08-15 DIAGNOSIS — M25672 Stiffness of left ankle, not elsewhere classified: Secondary | ICD-10-CM

## 2021-08-15 DIAGNOSIS — E871 Hypo-osmolality and hyponatremia: Secondary | ICD-10-CM

## 2021-08-15 DIAGNOSIS — R2681 Unsteadiness on feet: Secondary | ICD-10-CM

## 2021-08-15 DIAGNOSIS — R600 Localized edema: Secondary | ICD-10-CM

## 2021-08-15 LAB — BASIC METABOLIC PANEL
BUN: 20 mg/dL (ref 6–23)
CO2: 27 mEq/L (ref 19–32)
Calcium: 9.5 mg/dL (ref 8.4–10.5)
Chloride: 100 mEq/L (ref 96–112)
Creatinine, Ser: 1.12 mg/dL (ref 0.40–1.20)
GFR: 54.18 mL/min — ABNORMAL LOW (ref >60.00)
Glucose, Bld: 139 mg/dL — ABNORMAL HIGH (ref 70–99)
Potassium: 4.2 mEq/L (ref 3.5–5.1)
Sodium: 135 mEq/L (ref 135–145)

## 2021-08-15 NOTE — Telephone Encounter (Signed)
Spoke to pt by phone: Documented under result notes ?

## 2021-08-15 NOTE — Therapy (Signed)
[facility] ?[address] ?[place], [place], [address] ?Phone: [phone]   Fax:  [phone] ? ?Physical Therapy Treatment ? ?Patient Details  ?Name: Mallory Stewart ?MRN: [id number] ?Date of Birth: 1960/05/20 ?Referring Provider (PT): Jessy Oto, MD ? ? ?Encounter Date: 08/15/2021 ? ? PT End of Session - 08/15/21 0924   ? ? Visit Number 6   ? Number of Visits 8   ? Date for PT Re-Evaluation 08/28/21   ? Authorization Type [employer] $150 copay after deductible met   ? Authorization - Visit Number 6   ? PT Start Time [phone]   ? PT Stop Time 1006   ? PT Time Calculation (min) 41 min   ? Activity Tolerance Patient tolerated treatment well   ? Behavior During Therapy [facility] for tasks assessed/performed   ? ?  ?  ? ?  ? ? ?Past Medical History:  ?Diagnosis Date  ? Alcohol-induced chronic pancreatitis ([place])   ? Alcoholic cirrhosis ([place])   ? B12 deficiency   ? Concussion   ? DDD (degenerative disc disease), cervical   ? Diabetes ([place])   ? DKA, type 2 ([place])   ? Gastric outlet obstruction 08/12/2018  ? Hypertension   ? Neuropathy   ? Pancreatic pseudocyst/cyst 05/06/2013  ? Seasonal allergies   ? ? ?Past Surgical History:  ?Procedure Laterality Date  ? ANKLE ARTHROSCOPY Right 12/20/2020  ? Procedure: RIGHT ANKLE ARTHROSCOPIC DEBRIDEMENT;  Surgeon: Newt Minion, MD;  Location: [place];  Service: Orthopedics;  Laterality: Right;  ? BIOPSY  01/19/2019  ? Procedure: BIOPSY;  Surgeon: Irving Copas., MD;  Location: [place];  Service: Gastroenterology;;  ? CHOLECYSTECTOMY N/A 07/28/2016  ? Procedure: LAPAROSCOPIC CHOLECYSTECTOMY WITH INTRAOPERATIVE CHOLANGIOGRAM;  Surgeon: Mickeal Skinner, MD;  Location: [place];  Service: General;  Laterality: N/A;  ? ESOPHAGOGASTRODUODENOSCOPY (EGD) WITH PROPOFOL N/A 08/13/2018  ? Procedure: ESOPHAGOGASTRODUODENOSCOPY (EGD) WITH PROPOFOL;  Surgeon: Rush Landmark Telford Nab., MD;  Location: [place];  Service: Gastroenterology;   Laterality: N/A;  ? ESOPHAGOGASTRODUODENOSCOPY (EGD) WITH PROPOFOL N/A 01/19/2019  ? Procedure: ESOPHAGOGASTRODUODENOSCOPY (EGD) WITH PROPOFOL;  Surgeon: Rush Landmark Telford Nab., MD;  Location: [place];  Service: Gastroenterology;  Laterality: N/A;  ? IR PARACENTESIS  08/08/2020  ? IR PARACENTESIS  03/13/2021  ? LEEP  1990's  ? ORIF FOOT FRACTURE  09/2008  ? L 5th metatarsal  ? REFRACTIVE SURGERY  2000  ? TIBIA IM NAIL INSERTION Left 03/12/2021  ? Procedure: INTRAMEDULLARY (IM) NAIL TIBIAL;  Surgeon: Jessy Oto, MD;  Location: [place];  Service: Orthopedics;  Laterality: Left;  ? TONSILLECTOMY    ? UPPER ESOPHAGEAL ENDOSCOPIC ULTRASOUND (EUS) N/A 08/13/2018  ? Procedure: UPPER ESOPHAGEAL ENDOSCOPIC ULTRASOUND (EUS);  Surgeon: Irving Copas., MD;  Location: [place];  Service: Gastroenterology;  Laterality: N/A;  ? UPPER ESOPHAGEAL ENDOSCOPIC ULTRASOUND (EUS) N/A 01/19/2019  ? Procedure: UPPER ESOPHAGEAL ENDOSCOPIC ULTRASOUND (EUS);  Surgeon: Irving Copas., MD;  Location: [place];  Service: Gastroenterology;  Laterality: N/A;  ? ? ?There were no vitals filed for this visit. ? ? Subjective Assessment - 08/15/21 0925   ? ? Subjective now WBAT and can increase activity as tolerated.  reports increased pain with increased activity - to be expected   ? Pertinent History DDD cervical, HTN, h/o left ankle sprain, closed fx of distal end of left tibia, closed fx of promimal fibula   ? Patient Stated Goals pain free independent mobility   ? Currently in Pain? Yes   ?  Pain Score 0-No pain   up to 8/10 with activity  ? Pain Location Ankle   ? Pain Orientation Left   ? Pain Descriptors / Indicators Aching;Sore;Tightness   ? Pain Type Chronic pain;Surgical pain   ? Pain Onset More than a month ago   ? Pain Frequency Intermittent   ? Aggravating Factors  standing/walking   ? Pain Relieving Factors rest   ? ?  ?  ? ?  ? ? ? ? ? ? ? ? ? ? ? ? ? ? ? ? ? ? ? ? [facility] Adult PT Treatment/Exercise - 08/15/21 0930    ? ?  ? Ambulation/Gait  ? Stairs Yes   ? Stairs Assistance 5: Supervision   ? Stairs Assistance Details (indicate cue type and reason) cues needed for sequencing and technique   ? Stair Management Technique One rail Left;Alternating pattern   ? Number of Stairs 24   ? Height of Stairs 7   ?  ? Knee/Hip Exercises: Aerobic  ? Nustep L6 x 10 min   ?  ? Knee/Hip Exercises: Standing  ? Heel Raises Both;20 reps   ? Hip Abduction Both;20 reps;Knee straight   ? Hip Extension Both;20 reps;Knee straight   ? Forward Step Up Both;10 reps;Hand Hold: 1;Step Height: 6"   ? Step Down Both;10 reps;Hand Hold: 1;Step Height: 6"   ? ?  ?  ? ?  ? ? ? ? ? ? ? ? ? ? PT Education - 08/15/21 1013   ? ? Education Details HEP progression   ? Person(s) Educated Patient   ? Methods Explanation;Demonstration;Handout   ? Comprehension Verbalized understanding;Returned demonstration;Need further instruction   ? ?  ?  ? ?  ? ? ? PT Short Term Goals - 06/20/21 0957   ? ?  ? PT SHORT TERM GOAL #1  ? Title Patient will demonstrate independent use of home exercise program to maintain progress from in clinic treatments.   ? Time 4   ? Period Weeks   ? Status Achieved   ? Target Date 06/06/21   ? ?  ?  ? ?  ? ? ? ? PT Long Term Goals - 07/03/21 1249   ? ?  ? PT LONG TERM GOAL #1  ? Title Patient will demonstrate/report pain at worst less than or equal to 2/10 to facilitate minimal limitation in daily activity secondary to pain symptoms.   ? Time 8   ? Period Weeks   ? Status On-going   ? Target Date 08/28/21   ?  ? PT LONG TERM GOAL #2  ? Title Patient will demonstrate independent use of home exercise program to facilitate ability to maintain/progress functional gains from skilled physical therapy services.   ? Time 8   ? Period Weeks   ? Status On-going   ? Target Date 08/28/21   ?  ? PT LONG TERM GOAL #3  ? Title Pt. will demonstrate FOTO outcome > or = 59 to indicated reduced disability due to condition.   ? Time 8   ? Period Weeks   ? Status  On-going   ? Target Date 08/28/21   ?  ? PT LONG TERM GOAL #4  ? Title Pt. will demonstrate modified independent community ambulation > 300 ft s deviation with SPC for improved mobility and decreased fall risk   ? Time 8   ? Period Weeks   ? Status On-going   ? Target Date 08/28/21   ?  ?  PT LONG TERM GOAL #5  ? Title Pt. will demonstrate Lt ankle df to at least neutral, inversion/eversion > 25 degrees to facilitate usual mobility for daily walking, standing, transfers at PLOF.   ? Time 8   ? Period Weeks   ? Status On-going   ? Target Date 08/28/21   ?  ? PT LONG TERM GOAL #6  ? Title Pt. will demonstrate Lt ankle MMT 4/5 throughout to facilitate usual daily and recreational activity at PLOF.   ? Time 8   ? Period Weeks   ? Status On-going   ? Target Date 08/28/21   ? ?  ?  ? ?  ? ? ? ? ? ? ? ? Plan - 08/15/21 1014   ? ? Clinical Impression Statement Pt now WBAT so session focused on strengthening exercises and activities as well as gait training with stair negotiation.  Pt with improved stair negotiation with repetition and cues for sequencing and technique.  Will continue to benefit from PT to maximize function.   ? Personal Factors and Comorbidities Comorbidity 3+   ? Comorbidities DDD cervical, HTN, extensive vestibular complication history, Rt ankle fx   ? Examination-Activity Limitations Bend;Squat;Stairs;Carry;Stand;Transfers;Dressing;Locomotion Level   ? Examination-Participation Restrictions Cleaning;Community Activity;Shop;Driving;Yard Work;Laundry;Occupation   ? Stability/Clinical Decision Making Evolving/Moderate complexity   ? Rehab Potential Good   ? PT Frequency 1x / week   set to 1x/wk due to insurance/financial obligation  ? PT Duration 8 weeks   ? PT Treatment/Interventions ADLs/Self Care Home Management;Cryotherapy;Electrical Stimulation;Iontophoresis 70m/ml Dexamethasone;Moist Heat;Balance training;Therapeutic exercise;Therapeutic activities;Functional mobility training;Stair training;Gait  training;DME Instruction;Ultrasound;Neuromuscular re-education;Patient/family education;Passive range of motion;Taping;Vasopneumatic Device;Manual techniques;Canalith Repostioning;Vestibular   ? PT Next Visit Plan

## 2021-08-15 NOTE — Patient Instructions (Signed)
Access Code: [email or web address] ?URL: [email or web address] ?Date: 08/15/2021 ?Prepared by: Moshe Cipro ? ?Exercises ?- Ankle Pumps in Elevation  - 2 x daily - 7 x weekly - 3 sets - 10 reps ?- Supine Ankle Inversion Eversion AROM  - 2 x daily - 7 x weekly - 3 sets - 10 reps ?- Ankle Alphabet in Elevation  - 2 x daily - 7 x weekly - 3 sets - 10 reps ?- Long Sitting Ankle Eversion with Resistance  - 2 x daily - 7 x weekly - 3 sets - 10 reps ?- Long Sitting Ankle Plantar Flexion with Resistance  - 2 x daily - 7 x weekly - 3 sets - 10 reps ?- Long Sitting Ankle Inversion with Resistance  - 2 x daily - 7 x weekly - 3 sets - 10 reps ?- Long Sitting Ankle Dorsiflexion with Anchored Resistance  - 2 x daily - 7 x weekly - 3 sets - 10 reps ?- Standing Dorsiflexion Self-Mobilization on Step  - 2 x daily - 7 x weekly - 3 sets - 10 reps - 2-3 hold ?- Mini Squat with Counter Support  - 1 x daily - 7 x weekly - 1 sets - 15 reps ?- Sidelying Hip Abduction  - 1 x daily - 7 x weekly - 2 sets - 10 reps ?- Clamshell  - 1 x daily - 7 x weekly - 3 sets - 10 reps ?- Prone Hip Extension  - 1 x daily - 7 x weekly - 2-3 sets - 10 reps ?- Heel Raises with Counter Support  - 1 x daily - 7 x weekly - 1 sets - 20 reps ?- Standing Hip Abduction with Counter Support  - 1 x daily - 7 x weekly - 1 sets - 20 reps ?- Standing Hip Extension with Counter Support  - 1 x daily - 7 x weekly - 1 sets - 20 reps ?

## 2021-08-15 NOTE — Telephone Encounter (Signed)
Patient is returning your call.  

## 2021-08-21 ENCOUNTER — Other Ambulatory Visit: Payer: Self-pay | Admitting: Family Medicine

## 2021-08-24 ENCOUNTER — Other Ambulatory Visit: Payer: 59

## 2021-08-29 ENCOUNTER — Other Ambulatory Visit: Payer: 59

## 2021-09-04 ENCOUNTER — Ambulatory Visit: Payer: 59 | Admitting: Neurology

## 2021-09-04 DIAGNOSIS — R4182 Altered mental status, unspecified: Secondary | ICD-10-CM

## 2021-09-08 ENCOUNTER — Telehealth: Payer: Self-pay

## 2021-09-08 NOTE — Telephone Encounter (Signed)
Patient states that her fasting morning blood sugars have been between 50-65 for past week.  ? ?Patient has been taking 30 units of  Basaglar in the morning.  ?

## 2021-09-08 NOTE — Telephone Encounter (Signed)
LMTCB with sugar numbers and how she has been taking medication  ?

## 2021-09-08 NOTE — Telephone Encounter (Signed)
Detailed vm left for patient.  ?

## 2021-09-11 ENCOUNTER — Ambulatory Visit (INDEPENDENT_AMBULATORY_CARE_PROVIDER_SITE_OTHER): Payer: 59

## 2021-09-11 ENCOUNTER — Encounter: Payer: Self-pay | Admitting: Specialist

## 2021-09-11 ENCOUNTER — Ambulatory Visit: Payer: 59 | Admitting: Specialist

## 2021-09-11 VITALS — BP 147/96 | HR 83 | Ht 69.0 in | Wt 157.0 lb

## 2021-09-11 DIAGNOSIS — S82242A Displaced spiral fracture of shaft of left tibia, initial encounter for closed fracture: Secondary | ICD-10-CM

## 2021-09-11 DIAGNOSIS — M222X2 Patellofemoral disorders, left knee: Secondary | ICD-10-CM

## 2021-09-11 MED ORDER — BUPIVACAINE HCL 0.5 % IJ SOLN
2.0000 mL | INTRAMUSCULAR | Status: AC | PRN
  Administered 2021-09-11: 2 mL via INTRA_ARTICULAR

## 2021-09-11 MED ORDER — LIDOCAINE HCL 1 % IJ SOLN
2.0000 mL | INTRAMUSCULAR | Status: AC | PRN
  Administered 2021-09-11: 2 mL

## 2021-09-11 MED ORDER — METHYLPREDNISOLONE ACETATE 40 MG/ML IJ SUSP
40.0000 mg | INTRAMUSCULAR | Status: AC | PRN
  Administered 2021-09-11: 40 mg via INTRA_ARTICULAR

## 2021-09-11 NOTE — Patient Instructions (Signed)
Plan: Straight leg raise or terminal quad strengthening.  ?Ice 2-3 times daily 10-15 min ?Use voltaren gel apply to area of the anterior knee 4 gm  applied 3-4 times per day. ?Knee Arthroscopy Physical Therapy: Straight Leg Raises ?You will learn how to do straight leg raises as part of the recovery from knee arthroscopy. ?To view the content, go to this web address: ?[email or web address] ? ?This video will expire on: 07/25/2023. If you need access to this video following this date, please reach out to the healthcare provider who assigned it to you. ?This information is not intended to replace advice given to you by your health care provider. Make sure you discuss any questions you have with your health care provider. ?Elsevier Patient Education ? 2023 Elsevier Inc. ? ?

## 2021-09-11 NOTE — Progress Notes (Addendum)
? ?Office Visit Note ?  ?Patient: Mallory Stewart           ?Date of Birth: Jun 02, 1960           ?MRN: [id number] ?Visit Date: 09/11/2021 ?             ?Requested by: [name], MD ?[address] ?[place],  [place] [id number] ?PCP: [name], MD ? ? ?Assessment & Plan: ?Visit Diagnoses:  ?1. Displaced spiral fracture of shaft of left tibia, initial encounter for closed fracture   ?2. Patellofemoral pain syndrome of left knee   ? ? ?Plan: Straight leg raise or terminal quad strengthening.  ?Ice 2-3 times daily 10-15 min ?Use voltaren gel apply to area of the anterior knee 4 gm  applied 3-4 times per day. ? ? ?Follow-Up Instructions: No follow-ups on file.  ? ?Orders:  ?Orders Placed This Encounter  ?Procedures  ?? XR Tibia/Fibula Left  ? ?No orders of the defined types were placed in this encounter. ? ? ? ? Procedures: ?Large Joint Inj: L knee on 09/11/2021 4:18 PM ?Indications: pain ?Details: 25 G 1.5 in needle, anterolateral approach ? ?Arthrogram: No ? ?Medications: 40 mg methylPREDNISolone acetate 40 MG/ML; 2 mL bupivacaine 0.5 %; 2 mL lidocaine 1 % ?Outcome: tolerated well, no immediate complications ?Procedure, treatment alternatives, risks and benefits explained, specific risks discussed. Consent was given by the patient. Immediately prior to procedure a time out was called to verify the correct patient, procedure, equipment, support staff and site/side marked as required. Patient was prepped and draped in the usual sterile fashion.  ? ? ? ?Clinical Data: ?No additional findings. ? ? ?Subjective: ?Chief Complaint  ?Patient presents with  ?? Left Lower Leg - Follow-up, Pain  ? ? ?61 year old female with history of left distal 1/3 tibia fracture. Osteoporosis and previous right ankle fracture. She has taken meds for pain in the past. Last seen with improved pain pattern, less pain in the left tibia, CT with a fracture line that did extend into the left ankle posterior malleolus. She has been walking  more and is still using a cane for balance. Reports increasing pain into the left anterior knee with mild swelling. She is exercising more and using a reclined elliptical machine. She is proud of the  ?Work she has been doing but has noticed increasing pain in the left anterior knee and feeling like she might be having the rod move into the left knee.  ? ?Review of Systems  ?Constitutional: Negative.   ?HENT: Negative.    ?Eyes: Negative.   ?Respiratory: Negative.    ?Cardiovascular: Negative.   ?Gastrointestinal: Negative.   ?Endocrine: Negative.   ?Genitourinary: Negative.   ?Musculoskeletal: Negative.   ?Skin: Negative.   ?Allergic/Immunologic: Negative.   ?Neurological: Negative.   ?Hematological: Negative.   ?Psychiatric/Behavioral: Negative.    ? ? ?Objective: ?Vital Signs: BP (!) 147/96 (BP Location: Left Arm, Patient Position: Sitting)   Pulse 83   Ht 5\' 9"  (1.753 m)   Wt 157 lb (71.2 kg)   BMI 23.18 kg/m?  ? ?Physical Exam ?Constitutional:   ?   Appearance: She is well-developed.  ?HENT:  ?   Head: Normocephalic and atraumatic.  ?Eyes:  ?   Pupils: Pupils are equal, round, and reactive to light.  ?Pulmonary:  ?   Effort: Pulmonary effort is normal.  ?   Breath sounds: Normal breath sounds.  ?Abdominal:  ?   General: Bowel sounds are  normal.  ?   Palpations: Abdomen is soft.  ?Musculoskeletal:  ?   Cervical back: Normal range of motion and neck supple.  ?   Left knee:  ?   Instability Tests: Medial McMurray test negative and lateral McMurray test negative.  ?Skin: ?   General: Skin is warm and dry.  ?Neurological:  ?   Mental Status: She is alert and oriented to person, place, and time.  ?Psychiatric:     ?   Behavior: Behavior normal.     ?   Thought Content: Thought content normal.     ?   Judgment: Judgment normal.  ? ?Left Knee Exam  ? ?Range of Motion  ?Extension:  -5 abnormal  ?Flexion:  130  ? ?Tests  ?McMurray:  Medial - negative Lateral - negative ?Varus: negative Valgus: negative ?Lachman:   Anterior - negative    Posterior - negative ?Drawer:  Anterior - negative     Posterior - negative ?Pivot shift: negative ?Patellar apprehension: 3+ ? ?Other  ?Erythema: absent ?Scars: present ?Sensation: normal ?Pulse: present ?Swelling: mild ? ?Comments:  Left knee circumference increased by 1/8th inch, left and right thigh circumference is equal, no atrophy. There is boggy swelling left distal quadriceps tendon. Positive apprehension sign with patella grind test left P-F joint.  ?The left knee has some loss of extension suggesting some hamstring tightness,  ? ? ? ?Specialty Comments:  ?No specialty comments available. ? ?Imaging: ?No results found. ? ? ?PMFS History: ?Patient Active Problem List  ? Diagnosis Date Noted  ?? Closed fracture of distal end of left tibia, initial encounter 03/12/2021  ?  Priority: High  ?  Class: Acute  ?? Closed fracture of proximal fibula 03/12/2021  ?  Priority: Medium   ?  Class: Acute  ?? Stage 3b chronic kidney disease (CKD) (HCC) - Baseline creatinine approximately 1.7 08/07/2021  ?? Balance problem 07/04/2021  ?? Confusion 07/04/2021  ?? Vision changes 07/04/2021  ?? H/O multiple concussions 07/04/2021  ?? Type 2 diabetes mellitus with hyperglycemia, with long-term current use of insulin ([place]) 03/24/2021  ?? Secondary esophageal varices without bleeding ([place])   ?? Displaced spiral fracture of shaft of left tibia, initial encounter for closed fracture 03/12/2021  ?? Impingement of right ankle joint   ?? Gastric varices without bleeding 12/06/2020  ?? Portal hypertension ([place]) 09/30/2020  ?? Dilated bile duct 09/30/2020  ?? Alcoholic cirrhosis of liver with ascites ([place]) 09/29/2020  ?? Right foot pain 09/17/2020  ?? Laceration of lesser toe of right foot without foreign body present or damage to nail 09/17/2020  ?? Frequent falls 09/14/2020  ?? Closed fracture of ankle 09/14/2020  ?? Foot sprain, right, subsequent encounter 08/26/2020  ?? Sprain of anterior talofibular ligament  of right ankle 08/26/2020  ?? Splenomegaly 08/09/2020  ?? Malnutrition of moderate degree ([place]) 08/08/2020  ?? Chronic hyponatremia 08/06/2020  ?? Transaminitis 08/06/2020  ?? Serum total bilirubin elevated 08/06/2020  ?? Fall at home, initial encounter 08/06/2020  ?? Generalized weakness 08/05/2020  ?? Idiopathic peripheral neuropathy 07/13/2020  ?? Type 2 diabetes mellitus with diabetic polyneuropathy, with long-term current use of insulin ([place]) 07/13/2020  ?? Persistent cough for 3 weeks or longer 07/04/2020  ?? Low HDL (under 40) 06/21/2020  ?? Post-nasal drip 06/21/2020  ?? Allergic rhinitis 06/21/2020  ?? Elevated alkaline phosphatase level 06/21/2020  ?? Duodenitis - edema ? andgioedema 10/30/2018  ?? Chronic alcoholic pancreatitis ([place]) 09/02/2018  ?? Diabetes mellitus with circulatory complication, HTN ([place]) 123456  ??  Dizziness 12/26/2017  ?? Chronic pain 05/17/2017  ?? Osteoarthritis of spine with radiculopathy, cervical region 09/17/2016  ?? Hyperkalemia 03/02/2016  ?? B12 deficiency 10/21/2014  ?? Neuropathic pain of both legs 02/11/2014  ?? Hepatic steatosis 04/27/2013  ?? GAD (generalized anxiety disorder) 09/23/2009  ?? Hyperlipidemia associated with type 2 diabetes mellitus ([place]) 11/03/2008  ?? Hypertension associated with diabetes ([place]) 09/29/2008  ?? PAP SMEAR, ABNORMAL 09/29/2008  ? ?Past Medical History:  ?Diagnosis Date  ?? Alcohol-induced chronic pancreatitis ([place])   ?? Alcoholic cirrhosis ([place])   ?? B12 deficiency   ?? Concussion   ?? DDD (degenerative disc disease), cervical   ?? Diabetes ([place])   ?? DKA, type 2 ([place])   ?? Gastric outlet obstruction 08/12/2018  ?? Hypertension   ?? Neuropathy   ?? Pancreatic pseudocyst/cyst 05/06/2013  ?? Seasonal allergies   ?  ?Family History  ?Problem Relation Age of Onset  ?? Other Mother   ?     tachycardia.Marland KitchenMarland Kitchen?afib  ?? Stroke Father   ?     after hernia suegery  ?? Prostate cancer Father   ?? Other Father   ?     global transient amnesia, unclear  source  ?? Atrial fibrillation Sister   ?? Healthy Brother   ?? Healthy Brother   ?? Coronary artery disease Paternal Grandmother   ?? Heart attack Paternal Grandmother 20  ?? Brain cancer Maternal Grandfather

## 2021-09-11 NOTE — Procedures (Addendum)
ELECTROENCEPHALOGRAM REPORT ? ?Date of Study: 09/04/2021 ? ?Patient's Name: Mallory Stewart ?MRN: [id number] ?Date of Birth: 06-Dec-1960 ? ?Referring Provider: Marlowe Kays, PA-C ? ?Clinical History: This is a 61 year old woman with increased confusion, episodes of amnesia. ? ?Medications: ?XANAX 0.25 MG tablet ?ELAVIL  75 mg ?aspirin  81 mg ?COREG 12.5 MG tablet ?ZYRTEC  10 mg ?JARDIANCE ?25 mg ?65 Fe MG tablet ?LASIX  20 mg ?GLUCOTROL  5 mg ?MULTIVITAMIN WITH MINERALS TABS tablet ?OXY IR/ROXICODONE  5-10 mg ?PROTONIX 40 MG tablet ? ?Technical Summary: ?A multichannel digital 46-minute EEG recording measured by the international 10-20 system with electrodes applied with paste and impedances below 5000 ohms performed as portable with EKG monitoring in an awake and asleep patient.  Hyperventilation was not performed. Photic stimulation was performed.  The digital EEG was referentially recorded, reformatted, and digitally filtered in a variety of bipolar and referential montages for optimal display.  ? ?Description: ?The patient is awake and asleep during the recording.  During maximal wakefulness, there is a symmetric, medium voltage 8 Hz posterior dominant rhythm that attenuates with eye opening. This is admixed with a small amount of diffuse 4-5 Hz theta slowing of the waking background.  During drowsiness and sleep, there is an increase in theta and delta slowing of the background, at times with triphasic-like appearance. Poorly formed vertex waves were seen. Photic stimulation did not elicit any abnormalities.  There were no epileptiform discharges or electrographic seizures seen.   ? ?EKG lead was unremarkable. ? ?Impression: ?This 46-minute awake and asleep EEG is abnormal due to mild diffuse slowing of the waking background. ? ?Clinical Correlation of the above findings indicates diffuse cerebral dysfunction that is non-specific in etiology and can be seen with hypoxic/ischemic injury, toxic/metabolic  encephalopathies, neurodegenerative disorders, or medication effect. Triphasic-like waves are typically seen with metabolic encephalopathy.  The absence of epileptiform discharges does not rule out a clinical diagnosis of epilepsy.  Clinical correlation is advised. ? ? ?Patrcia Dolly, M.D. ? ?

## 2021-09-11 NOTE — Progress Notes (Signed)
Please inform the patient  Her EEG was just mildly slow, looks like her last bloodwork has normalized. Unless she is still having symptoms of confusion, would just proceed with her brain MRI. If still having symptoms, would do a prolonged 48-hour EEG, thanks!

## 2021-09-12 ENCOUNTER — Telehealth: Payer: Self-pay | Admitting: Physician Assistant

## 2021-09-12 NOTE — Telephone Encounter (Signed)
Pt called no answer left a voice mail per DPR that the slowing of brain waves per Dr Manfred Shirts is most likely due to medication.  ?

## 2021-09-12 NOTE — Telephone Encounter (Signed)
Patient called and stated she had missed a call.  She stated she thinks it was for EEG results. ?

## 2021-09-12 NOTE — Telephone Encounter (Signed)
Pt called an informed that Her EEG was just mildly slow, looks like her last bloodwork has normalized. Unless she is still having symptoms of confusion, would just proceed with her brain MRI. If still having symptoms, would do a prolonged 48-hour EEG,. At this time she does not want to do the 48 hour eeg ,  ? ?Pt is asking what caused the eeg to be mildly slow? ?

## 2021-09-19 ENCOUNTER — Encounter: Payer: Self-pay | Admitting: Physical Therapy

## 2021-09-19 ENCOUNTER — Ambulatory Visit (INDEPENDENT_AMBULATORY_CARE_PROVIDER_SITE_OTHER): Payer: 59 | Admitting: Physical Therapy

## 2021-09-19 DIAGNOSIS — M6281 Muscle weakness (generalized): Secondary | ICD-10-CM | POA: Diagnosis not present

## 2021-09-19 DIAGNOSIS — M25672 Stiffness of left ankle, not elsewhere classified: Secondary | ICD-10-CM

## 2021-09-19 DIAGNOSIS — R262 Difficulty in walking, not elsewhere classified: Secondary | ICD-10-CM

## 2021-09-19 DIAGNOSIS — R600 Localized edema: Secondary | ICD-10-CM

## 2021-09-19 DIAGNOSIS — M25572 Pain in left ankle and joints of left foot: Secondary | ICD-10-CM

## 2021-09-19 DIAGNOSIS — R2681 Unsteadiness on feet: Secondary | ICD-10-CM

## 2021-09-19 NOTE — Therapy (Addendum)
[facility] [address] [place], [place], [address] Phone: [phone]   Fax:  [phone]  Physical Therapy Treatment Recertification Discharge Summary   Patient Details  Name: Mallory Stewart MRN: [id number] Date of Birth: [birth year] Referring Provider (PT): Vira Browns MD   Encounter Date: 09/19/2021   PT End of Session - 09/19/21 1103     Visit Number 7    Number of Visits 13    Date for PT Re-Evaluation 12/15/21    Authorization Type [employer] $150 copay after deductible met    Authorization Time Period Recert sent on 09/18/2021: submitted for 12 weeks, 1x/ week every other week    PT Start Time 1102    PT Stop Time 1145    PT Time Calculation (min) 43 min    Activity Tolerance Patient tolerated treatment well    Behavior During Therapy [facility] for tasks assessed/performed             Past Medical History:  Diagnosis Date   Alcohol-induced chronic pancreatitis (HCC)    Alcoholic cirrhosis (HCC)    B12 deficiency    Concussion    DDD (degenerative disc disease), cervical    Diabetes (HCC)    DKA, type 2 (HCC)    Gastric outlet obstruction 08/12/2018   Hypertension    Neuropathy    Pancreatic pseudocyst/cyst 05/06/2013   Seasonal allergies     Past Surgical History:  Procedure Laterality Date   ANKLE ARTHROSCOPY Right 12/20/2020   Procedure: RIGHT ANKLE ARTHROSCOPIC DEBRIDEMENT;  Surgeon: Nadara Mustard, MD;  Location: [facility];  Service: Orthopedics;  Laterality: Right;   BIOPSY  01/19/2019   Procedure: BIOPSY;  Surgeon: Meridee Score Netty Starring., MD;  Location: [facility] ENDOSCOPY;  Service: Gastroenterology;;   CHOLECYSTECTOMY N/A 07/28/2016   Procedure: LAPAROSCOPIC CHOLECYSTECTOMY WITH INTRAOPERATIVE CHOLANGIOGRAM;  Surgeon: Rodman Pickle, MD;  Location: [facility] OR;  Service: General;  Laterality: N/A;   ESOPHAGOGASTRODUODENOSCOPY (EGD) WITH PROPOFOL N/A 08/13/2018   Procedure: ESOPHAGOGASTRODUODENOSCOPY (EGD)  WITH PROPOFOL;  Surgeon: Lemar Lofty., MD;  Location: [facility] ENDOSCOPY;  Service: Gastroenterology;  Laterality: N/A;   ESOPHAGOGASTRODUODENOSCOPY (EGD) WITH PROPOFOL N/A 01/19/2019   Procedure: ESOPHAGOGASTRODUODENOSCOPY (EGD) WITH PROPOFOL;  Surgeon: Meridee Score Netty Starring., MD;  Location: [facility] ENDOSCOPY;  Service: Gastroenterology;  Laterality: N/A;   IR PARACENTESIS  08/08/2020   IR PARACENTESIS  03/13/2021   LEEP  1990's   ORIF FOOT FRACTURE  09/2008   L 5th metatarsal   REFRACTIVE SURGERY  2000   TIBIA IM NAIL INSERTION Left 03/12/2021   Procedure: INTRAMEDULLARY (IM) NAIL TIBIAL;  Surgeon: Kerrin Champagne, MD;  Location: [facility] OR;  Service: Orthopedics;  Laterality: Left;   TONSILLECTOMY     UPPER ESOPHAGEAL ENDOSCOPIC ULTRASOUND (EUS) N/A 08/13/2018   Procedure: UPPER ESOPHAGEAL ENDOSCOPIC ULTRASOUND (EUS);  Surgeon: Lemar Lofty., MD;  Location: [facility] ENDOSCOPY;  Service: Gastroenterology;  Laterality: N/A;   UPPER ESOPHAGEAL ENDOSCOPIC ULTRASOUND (EUS) N/A 01/19/2019   Procedure: UPPER ESOPHAGEAL ENDOSCOPIC ULTRASOUND (EUS);  Surgeon: Lemar Lofty., MD;  Location: [facility] ENDOSCOPY;  Service: Gastroenterology;  Laterality: N/A;    There were no vitals filed for this visit.   Subjective Assessment - 09/19/21 1137     Subjective Pt WBAT and has been amb with straight cane at home. Pt still with limited visits due to high co-pay.    Pertinent History DDD cervical, HTN, h/o left ankle sprain, closed fx of distal end of left tibia, closed fx of promimal fibula  Limitations Standing;Walking;House hold activities    Patient Stated Goals pain free independent mobility    Currently in Pain? Yes    Pain Score 5     Pain Location Ankle   knee   Pain Orientation Left    Pain Descriptors / Indicators Aching    Pain Type Chronic pain;Surgical pain    Pain Onset More than a month ago    Pain Frequency Intermittent    Aggravating Factors  standing, walking                 [facility] PT Assessment - 09/19/21 0001       Assessment   Medical Diagnosis M79.605    Referring Provider (PT) Vira Browns MD      Restrictions   LLE Weight Bearing Weight bearing as tolerated      AROM   Left Ankle Dorsiflexion 0   pt able to get to neutral position   Left Ankle Plantar Flexion 40    Left Ankle Inversion 25    Left Ankle Eversion 15      PROM   Left Ankle Dorsiflexion 0    Left Ankle Plantar Flexion 45    Left Ankle Inversion 30    Left Ankle Eversion 20      Strength   Left Ankle Dorsiflexion 5/5    Left Ankle Plantar Flexion 4-/5    Left Ankle Inversion 5/5    Left Ankle Eversion 5/5      Ambulation/Gait   Gait Comments amb with st cane with step through gait pattern, more diffculty with turns and transitions                           [facility] Adult PT Treatment/Exercise - 09/19/21 0001       Ambulation/Gait   Stairs Yes    Stairs Assistance 6: Modified independent (Device/Increase time)    Stair Management Technique One rail Right    Number of Stairs 11    Height of Stairs 7      Neuro Re-ed    Neuro Re-ed Details  SLS on level surface with SBA and intermittent UE support in parallel bars as needed. Tanem stance in parallel bars with intermittent UE support and SBA.      Knee/Hip Exercises: Standing   Heel Raises Both;20 reps    Heel Raises Limitations Toe raises with limited ROM on left    Hip Abduction Both;20 reps;Knee straight    Hip Extension Both;20 reps;Knee straight    Lateral Step Up 15 reps;Step Height: 4";Hand Hold: 2    Lateral Step Up Limitations with UE support    Forward Step Up Both;10 reps;Hand Hold: 1;Step Height: 6"    Rocker Board 2 minutes;Limitations    Rocker Board Limitations UE support    Other Standing Knee Exercises side stepping taps with single UE support                     PT Education - 09/19/21 1305     Education Details Discussed frequency    Person(s) Educated Patient     Methods Explanation;Demonstration    Comprehension Verbalized understanding;Returned demonstration              PT Short Term Goals - 06/20/21 0957       PT SHORT TERM GOAL #1   Title Patient will demonstrate independent use of home exercise program to maintain progress from in clinic treatments.  Time 4    Period Weeks    Status Achieved    Target Date 06/06/21               PT Long Term Goals - 09/19/21 1252       PT LONG TERM GOAL #1   Title Patient will demonstrate/report pain at worst less than or equal to 2/10 to facilitate minimal limitation in daily activity secondary to pain symptoms.    Time 12    Period Weeks    Status On-going    Target Date 12/15/21      PT LONG TERM GOAL #2   Title Patient will demonstrate independent use of home exercise program to facilitate ability to maintain/progress functional gains from skilled physical therapy services.    Time 12    Period Weeks    Status On-going    Target Date 12/15/21      PT LONG TERM GOAL #3   Title Pt will be able to navigate curb step without assistance with st cane.    Time 12    Period Weeks    Status On-going    Target Date 12/15/21      PT LONG TERM GOAL #4   Title Pt. will demonstrate modified independent community ambulation > 300 ft s deviation with SPC for improved mobility and decreased fall risk    Status On-going      PT LONG TERM GOAL #5   Title Pt. will demonstrate Lt ankle df to at least neutral, inversion/eversion > 25 degrees to facilitate usual mobility for daily walking, standing, transfers at PLOF.    Baseline Still working on improved eversion    Time 12    Period Weeks    Status On-going    Target Date 12/15/21      PT LONG TERM GOAL #6   Title Pt. will demonstrate Lt ankle MMT 4/5 throughout to facilitate usual daily and recreational activity at PLOF.    Time 12    Period Weeks    Status On-going    Target Date 12/15/21                   Plan -  09/19/21 1245     Clinical Impression Statement Pt WBAT on her left LE and amb into clinic with a straight cane. Pt still presenting with antalgic gait and decreased balance. Pt with has made progress with her left ankle ROM and overall mobility, but deficits still in ankle DF, inversion and eversion as well as PF strength on her left. Pt's therapy visits have been limited by pt's high co-pay of $150 per visit. I am requesting an additional 12 weeks with pt attending 1x/ week every other week as financially able to maximize pt's functional mobility.    Personal Factors and Comorbidities Comorbidity 3+    Comorbidities DDD cervical, HTN, extensive vestibular complication history, Rt ankle fx    Examination-Activity Limitations Bend;Squat;Stairs;Carry;Stand;Transfers;Dressing;Locomotion Level    Examination-Participation Restrictions Cleaning;Community Activity;Shop;Driving;Yard Work;Laundry;Occupation    Stability/Clinical Decision Making Evolving/Moderate complexity    Rehab Potential Good    PT Frequency Other (comment)   every other week   PT Duration 12 weeks    PT Treatment/Interventions ADLs/Self Care Home Management;Cryotherapy;Electrical Stimulation;Iontophoresis 4mg /ml Dexamethasone;Moist Heat;Balance training;Therapeutic exercise;Therapeutic activities;Functional mobility training;Stair training;Gait training;DME Instruction;Ultrasound;Neuromuscular re-education;Patient/family education;Passive range of motion;Taping;Vasopneumatic Device;Manual techniques;Canalith Repostioning;Vestibular    PT Next Visit Plan continue with gait traiing and step/curb traing using st cane, balance/strengthening activities    PT Home  Exercise Plan [id number]    Consulted and Agree with Plan of Care Patient             Patient will benefit from skilled therapeutic intervention in order to improve the following deficits and impairments:  Abnormal gait, Decreased endurance, Hypomobility, Increased edema,  Decreased activity tolerance, Decreased strength, Pain, Decreased balance, Decreased mobility, Difficulty walking, Impaired perceived functional ability, Improper body mechanics, Impaired flexibility, Decreased coordination, Decreased range of motion, Dizziness, Impaired sensation  Visit Diagnosis: Pain in left ankle and joints of left foot  Muscle weakness (generalized)  Stiffness of left ankle, not elsewhere classified  Difficulty in walking, not elsewhere classified  Localized edema  Unsteadiness on feet     Problem List Patient Active Problem List   Diagnosis Date Noted   Stage 3b chronic kidney disease (CKD) (HCC) - Baseline creatinine approximately 1.7 08/07/2021   Balance problem 07/04/2021   Confusion 07/04/2021   Vision changes 07/04/2021   H/O multiple concussions 07/04/2021   Type 2 diabetes mellitus with hyperglycemia, with long-term current use of insulin (HCC) 03/24/2021   Secondary esophageal varices without bleeding (HCC)    Closed fracture of distal end of left tibia, initial encounter 03/12/2021    Class: Acute   Closed fracture of proximal fibula 03/12/2021    Class: Acute   Displaced spiral fracture of shaft of left tibia, initial encounter for closed fracture 03/12/2021   Impingement of right ankle joint    Gastric varices without bleeding 12/06/2020   Portal hypertension (HCC) 09/30/2020   Dilated bile duct 09/30/2020   Alcoholic cirrhosis of liver with ascites (HCC) 09/29/2020   Right foot pain 09/17/2020   Laceration of lesser toe of right foot without foreign body present or damage to nail 09/17/2020   Frequent falls 09/14/2020   Closed fracture of ankle 09/14/2020   Foot sprain, right, subsequent encounter 08/26/2020   Sprain of anterior talofibular ligament of right ankle 08/26/2020   Splenomegaly 08/09/2020   Malnutrition of moderate degree (HCC) 08/08/2020   Chronic hyponatremia 08/06/2020   Transaminitis 08/06/2020   Serum total bilirubin  elevated 08/06/2020   Fall at home, initial encounter 08/06/2020   Generalized weakness 08/05/2020   Idiopathic peripheral neuropathy 07/13/2020   Type 2 diabetes mellitus with diabetic polyneuropathy, with long-term current use of insulin (HCC) 07/13/2020   Persistent cough for 3 weeks or longer 07/04/2020   Low HDL (under 40) 06/21/2020   Post-nasal drip 06/21/2020   Allergic rhinitis 06/21/2020   Elevated alkaline phosphatase level 06/21/2020   Duodenitis - edema ? andgioedema 10/30/2018   Chronic alcoholic pancreatitis (HCC) 09/02/2018   Diabetes mellitus with circulatory complication, HTN (HCC) 08/22/2018   Dizziness 12/26/2017   Chronic pain 05/17/2017   Osteoarthritis of spine with radiculopathy, cervical region 09/17/2016   Hyperkalemia 03/02/2016   B12 deficiency 10/21/2014   Neuropathic pain of both legs 02/11/2014   Hepatic steatosis 04/27/2013   GAD (generalized anxiety disorder) 09/23/2009   Hyperlipidemia associated with type 2 diabetes mellitus (HCC) 11/03/2008   Hypertension associated with diabetes (HCC) 09/29/2008   PAP SMEAR, ABNORMAL 09/29/2008    Sharmon Leyden, PT, MPT 09/19/2021, 3:29 PM  [facility] [address] [place], [place], [address] Phone: [phone]   Fax:  [phone]  Name: Mallory Stewart MRN: [id number] Date of Birth: 01-04-1961     PHYSICAL THERAPY DISCHARGE SUMMARY  Visits from Start of Care: 7  Current functional level related to goals / functional outcomes: See above  Remaining deficits: unknown   Education / Equipment: HEP   Patient agrees to discharge. Patient goals were not met. Patient is being discharged due to not returning since the last visit.  Clarita Crane, PT, DPT 10/30/21 9:57 AM  [facility] [address] [place], [place], [address] Phone: [phone]   Fax:  [phone]

## 2021-09-27 ENCOUNTER — Other Ambulatory Visit: Payer: Self-pay | Admitting: Radiology

## 2021-09-27 ENCOUNTER — Telehealth: Payer: Self-pay | Admitting: Specialist

## 2021-09-27 DIAGNOSIS — M222X2 Patellofemoral disorders, left knee: Secondary | ICD-10-CM

## 2021-09-27 DIAGNOSIS — S82242A Displaced spiral fracture of shaft of left tibia, initial encounter for closed fracture: Secondary | ICD-10-CM

## 2021-09-27 NOTE — Telephone Encounter (Signed)
Pt would like the MRI ordered

## 2021-09-27 NOTE — Telephone Encounter (Signed)
MRI ordered for left knee

## 2021-10-04 ENCOUNTER — Ambulatory Visit: Payer: 59 | Admitting: Specialist

## 2021-10-06 ENCOUNTER — Telehealth: Payer: Self-pay | Admitting: Internal Medicine

## 2021-10-06 NOTE — Telephone Encounter (Signed)
Inbound call from pt regarding upcoming appointment for 10/11/21. Patients states she has an upcoming procedure and the reason for the follow up is to discuss her results . Patient states those results will not be back in time and wants to speak with a nurse to see if she needs to reschedule that appointment. Please give pt a call back to advise.  Thank You.

## 2021-10-06 NOTE — Telephone Encounter (Signed)
The pt appt has been rescheduled to after her MRI.  She is aware we will contact her with results of the MRI when available.  The pt has been advised of the information and verbalized understanding.

## 2021-10-09 ENCOUNTER — Other Ambulatory Visit: Payer: 59

## 2021-10-10 ENCOUNTER — Encounter: Payer: 59 | Admitting: Physical Therapy

## 2021-10-11 ENCOUNTER — Ambulatory Visit: Payer: 59 | Admitting: Internal Medicine

## 2021-10-13 ENCOUNTER — Ambulatory Visit
Admission: RE | Admit: 2021-10-13 | Discharge: 2021-10-13 | Disposition: A | Discharge status: Home / Self Care | Payer: 59 | Source: Ambulatory Visit | Attending: Specialist | Admitting: Specialist

## 2021-10-13 ENCOUNTER — Ambulatory Visit
Admission: RE | Admit: 2021-10-13 | Discharge: 2021-10-13 | Disposition: A | Discharge status: Home / Self Care | Payer: 59 | Source: Ambulatory Visit | Attending: Internal Medicine | Admitting: Internal Medicine

## 2021-10-13 DIAGNOSIS — M222X2 Patellofemoral disorders, left knee: Secondary | ICD-10-CM

## 2021-10-13 DIAGNOSIS — S82242A Displaced spiral fracture of shaft of left tibia, initial encounter for closed fracture: Secondary | ICD-10-CM

## 2021-10-13 DIAGNOSIS — K838 Other specified diseases of biliary tract: Secondary | ICD-10-CM

## 2021-10-13 MED ORDER — GADOBENATE DIMEGLUMINE 529 MG/ML IV SOLN
13.0000 mL | Freq: Once | INTRAVENOUS | Status: AC | PRN
  Administered 2021-10-13: 13 mL via INTRAVENOUS

## 2021-10-16 ENCOUNTER — Other Ambulatory Visit: Payer: Self-pay | Admitting: Internal Medicine

## 2021-10-16 DIAGNOSIS — K860 Alcohol-induced chronic pancreatitis: Secondary | ICD-10-CM

## 2021-10-16 DIAGNOSIS — K7031 Alcoholic cirrhosis of liver with ascites: Secondary | ICD-10-CM

## 2021-10-17 ENCOUNTER — Telehealth: Payer: Self-pay | Admitting: Specialist

## 2021-10-17 NOTE — Telephone Encounter (Signed)
I called and advsied that Dr. Otelia Sergeant does not go over scans over the phone, however I did get her scheduled for 10/25/21 @ 9:30am

## 2021-10-17 NOTE — Telephone Encounter (Signed)
Pt called requesting to have MRI results read over the phone. Dr. Otelia Sergeant next appt isn't until July. Pt phone number is [phone].

## 2021-10-18 ENCOUNTER — Telehealth: Payer: Self-pay | Admitting: Internal Medicine

## 2021-10-18 NOTE — Telephone Encounter (Signed)
Please see note below. 

## 2021-10-18 NOTE — Telephone Encounter (Signed)
Patient called states she she saw the comment in her imaging results from Dr. Leone Payor stating her Neurologist informed him that she drinks once a week and she states that is not true. Wanted to let him know.

## 2021-10-19 ENCOUNTER — Other Ambulatory Visit: Payer: Self-pay | Admitting: Internal Medicine

## 2021-10-20 ENCOUNTER — Other Ambulatory Visit: Payer: Self-pay | Admitting: Family Medicine

## 2021-10-20 DIAGNOSIS — E119 Type 2 diabetes mellitus without complications: Secondary | ICD-10-CM

## 2021-10-20 NOTE — Telephone Encounter (Signed)
Please schedule CPE with fasting labs prior with Dr. Bedsole.  

## 2021-10-25 ENCOUNTER — Encounter: Payer: Self-pay | Admitting: Specialist

## 2021-10-25 ENCOUNTER — Ambulatory Visit (INDEPENDENT_AMBULATORY_CARE_PROVIDER_SITE_OTHER): Payer: 59 | Admitting: Specialist

## 2021-10-25 VITALS — BP 135/91 | HR 92 | Ht 69.0 in | Wt 157.0 lb

## 2021-10-25 DIAGNOSIS — M222X2 Patellofemoral disorders, left knee: Secondary | ICD-10-CM

## 2021-10-25 DIAGNOSIS — M79605 Pain in left leg: Secondary | ICD-10-CM | POA: Diagnosis not present

## 2021-10-25 NOTE — Patient Instructions (Signed)
Plan: Knee is suffering from osteoarthritis, only real proven treatments are Weight loss, NSIADs like diclofenac and exercise. Well padded shoes help. Ice the knee that is suffering from osteoarthritis, only real proven treatments are Weight loss, NSIADs like diclofenac gel and exercise. Well padded shoes help. Ice the knee 2-3 times a day 15-20 mins at a time.-3 times a day 15-20 mins at a time. Hot showers in the AM.  Injection with steroid may be of benefit. Hemp CBD capsules, [employer] 5,000-7,000 mg per bottle, 60 capsules per bottle, take one capsule twice a day. Cane in the left hand to use with left leg weight bearing. Follow-Up Instructions: No follow-ups on file.

## 2021-10-25 NOTE — Progress Notes (Signed)
Office Visit Note   Patient: Mallory Stewart           Date of Birth: 1961-02-21           MRN: [id number] Visit Date: 10/25/2021              Requested by: [name], MD [place],  [place] [address] PCP: [name], MD   Assessment & Plan: Visit Diagnoses:  1. Patellofemoral pain syndrome of left knee   2. Pain of left lower extremity due to injury     Plan: Knee is suffering from osteoarthritis, only real proven treatments are Weight loss, NSIADs like diclofenac and exercise. Well padded shoes help. Ice the knee that is suffering from osteoarthritis, only real proven treatments are Weight loss, NSIADs like diclofenac gel and exercise. Well padded shoes help. Ice the knee 2-3 times a day 15-20 mins at a time.-3 times a day 15-20 mins at a time. Hot showers in the AM.  Injection with steroid may be of benefit. Hemp CBD capsules, amazon.com 5,000-7,000 mg per bottle, 60 capsules per bottle, take one capsule twice a day. Cane in the left hand to use with left leg weight bearing. Follow-Up Instructions: No follow-ups on file.    Follow-Up Instructions: No follow-ups on file.   Orders:  No orders of the defined types were placed in this encounter.  No orders of the defined types were placed in this encounter.     Procedures: No procedures performed   Clinical Data: Findings:  Study Result  Narrative & Impression CLINICAL DATA:  Left knee pain. The patient underwent fixation of a fracture of the distal left tibia in November, 2022.  EXAM: MRI OF THE LEFT KNEE WITHOUT CONTRAST  TECHNIQUE: Multiplanar, multisequence MR imaging of the knee was performed. No intravenous contrast was administered.  COMPARISON:  Plain films left tibia/fibula 09/11/2021.  FINDINGS: MENISCI  Medial meniscus:  Intact.  Lateral meniscus:  Intact.  LIGAMENTS  Cruciates:  Intact.  Collaterals:  Intact.  CARTILAGE  Patellofemoral:   Normal.  Medial:  Normal.  Lateral:  Normal.  Joint:  Very small joint effusion.  Popliteal Fossa:  No Baker's cyst.  Extensor Mechanism:  Intact.  Bones: IM nail in the proximal tibia is noted. Imaged bones otherwise appear normal.  Other: None.  No fluid collection or mass.  IMPRESSION: Intramedullary nail in the proximal tibia. Negative for acute bony abnormality. Negative for meniscal or ligament tear.  Small joint effusion.   Electronically Signed   By: Inge Rise M.D.   On: 10/16/2021 08:40    Subjective: Chief Complaint  Patient presents with  . Left Leg - Follow-up    61 year old female with history of liver disease, multiple falls, previous ankle fractures, she is now almost 7 months post op tibial nail for a distal 1/3 tibia fracture. Has some comminution of the fracture with a nondisplaced fracture line into the ankle plafond. The intramedullary interlocking screws allowed for fixation of this area in anatomic position and aligment. SHe has mainly anterior knee pain and difficulty with kneeling, squatting and bending of the knee.    Review of Systems  Constitutional: Negative.   HENT: Negative.    Eyes: Negative.   Respiratory: Negative.    Cardiovascular: Negative.   Gastrointestinal: Negative.   Endocrine: Negative.   Genitourinary: Negative.   Musculoskeletal: Negative.   Skin: Negative.   Allergic/Immunologic: Negative.   Neurological: Negative.  Hematological: Negative.   Psychiatric/Behavioral: Negative.       Objective: Vital Signs: BP (!) 135/91 (BP Location: Left Arm, Patient Position: Sitting)   Pulse 92   Ht 5\' 9"  (1.753 m)   Wt 157 lb (71.2 kg)   BMI 23.18 kg/m   Physical Exam Constitutional:      Appearance: She is well-developed.  HENT:     Head: Normocephalic and atraumatic.  Eyes:     Pupils: Pupils are equal, round, and reactive to light.  Pulmonary:     Effort: Pulmonary effort is normal.      Breath sounds: Normal breath sounds.  Abdominal:     General: Bowel sounds are normal.     Palpations: Abdomen is soft.  Musculoskeletal:     Cervical back: Normal range of motion and neck supple.     Left knee:     Instability Tests: Medial McMurray test negative and lateral McMurray test negative.  Skin:    General: Skin is warm and dry.  Neurological:     Mental Status: She is alert and oriented to person, place, and time.  Psychiatric:        Behavior: Behavior normal.        Thought Content: Thought content normal.        Judgment: Judgment normal.    Left Ankle Exam   Tenderness  The patient is experiencing tenderness in the medial malleolus and lateral malleolus.  Swelling: none  Range of Motion  Dorsiflexion:  5 abnormal  Plantar flexion:  30  Eversion:  30  Inversion:  30   Muscle Strength  Plantar flexion:  5/5  Anterior tibial:  5/5  Posterior tibial:  5/5 Gastrocsoleus:  4/5 Peroneal muscle:  5/5  Tests  Anterior drawer: negative Varus tilt: negative  Other  Scars: present Sensation: decreased Pulse: present  Comments:  Has peripheral neuropathy.     Left Knee Exam   Tenderness  The patient is experiencing tenderness in the medial retinaculum and patella.  Range of Motion  Extension:  -5  Flexion:  normal   Tests  McMurray:  Medial - negative Lateral - negative Varus: negative Valgus: negative Lachman:  Anterior - negative    Posterior - negative Drawer:  Anterior - negative     Posterior - negative Pivot shift: negative  Other  Sensation: normal Swelling: none  Comments:  Patellofemoral pain with positive grind test.     Specialty Comments:  No specialty comments available.  Imaging: No results found.   PMFS History: Patient Active Problem List   Diagnosis Date Noted  . Closed fracture of distal end of left tibia, initial encounter 03/12/2021    Priority: High    Class: Acute  . Closed fracture of proximal fibula  03/12/2021    Priority: Medium     Class: Acute  . Stage 3b chronic kidney disease (CKD) (HCC) - Baseline creatinine approximately 1.7 08/07/2021  . Balance problem 07/04/2021  . Confusion 07/04/2021  . Vision changes 07/04/2021  . H/O multiple concussions 07/04/2021  . Type 2 diabetes mellitus with hyperglycemia, with long-term current use of insulin (HCC) 03/24/2021  . Secondary esophageal varices without bleeding (HCC)   . Displaced spiral fracture of shaft of left tibia, initial encounter for closed fracture 03/12/2021  . Impingement of right ankle joint   . Gastric varices without bleeding 12/06/2020  . Portal hypertension (HCC) 09/30/2020  . Dilated bile duct 09/30/2020  . Alcoholic cirrhosis of liver with ascites (HCC) 09/29/2020  .  Right foot pain 09/17/2020  . Laceration of lesser toe of right foot without foreign body present or damage to nail 09/17/2020  . Frequent falls 09/14/2020  . Closed fracture of ankle 09/14/2020  . Foot sprain, right, subsequent encounter 08/26/2020  . Sprain of anterior talofibular ligament of right ankle 08/26/2020  . Splenomegaly 08/09/2020  . Malnutrition of moderate degree ([place]) 08/08/2020  . Chronic hyponatremia 08/06/2020  . Transaminitis 08/06/2020  . Serum total bilirubin elevated 08/06/2020  . Fall at home, initial encounter 08/06/2020  . Generalized weakness 08/05/2020  . Idiopathic peripheral neuropathy 07/13/2020  . Type 2 diabetes mellitus with diabetic polyneuropathy, with long-term current use of insulin ([place]) 07/13/2020  . Persistent cough for 3 weeks or longer 07/04/2020  . Low HDL (under 40) 06/21/2020  . Post-nasal drip 06/21/2020  . Allergic rhinitis 06/21/2020  . Elevated alkaline phosphatase level 06/21/2020  . Duodenitis - edema ? andgioedema 10/30/2018  . Chronic alcoholic pancreatitis ([place]) 09/02/2018  . Diabetes mellitus with circulatory complication, HTN ([place]) 123456  . Dizziness 12/26/2017  . Chronic pain  05/17/2017  . Osteoarthritis of spine with radiculopathy, cervical region 09/17/2016  . Hyperkalemia 03/02/2016  . B12 deficiency 10/21/2014  . Neuropathic pain of both legs 02/11/2014  . Hepatic steatosis 04/27/2013  . GAD (generalized anxiety disorder) 09/23/2009  . Hyperlipidemia associated with type 2 diabetes mellitus ([place]) 11/03/2008  . Hypertension associated with diabetes ([place]) 09/29/2008  . PAP SMEAR, ABNORMAL 09/29/2008   Past Medical History:  Diagnosis Date  . Alcohol-induced chronic pancreatitis ([place])   . Alcoholic cirrhosis ([place])   . B12 deficiency   . Concussion   . DDD (degenerative disc disease), cervical   . Diabetes ([place])   . DKA, type 2 ([place])   . Gastric outlet obstruction 08/12/2018  . Hypertension   . Neuropathy   . Pancreatic pseudocyst/cyst 05/06/2013  . Seasonal allergies     Family History  Problem Relation Age of Onset  . Other Mother        tachycardia.Marland KitchenMarland Kitchen?afib  . Stroke Father        after hernia suegery  . Prostate cancer Father   . Other Father        global transient amnesia, unclear source  . Atrial fibrillation Sister   . Healthy Brother   . Healthy Brother   . Coronary artery disease Paternal Grandmother   . Heart attack Paternal Grandmother 4  . Brain cancer Maternal Grandfather        ?  Marland Kitchen Cancer Paternal Grandfather        ?  Marland Kitchen Breast cancer Maternal Grandmother     Past Surgical History:  Procedure Laterality Date  . ANKLE ARTHROSCOPY Right 12/20/2020   Procedure: RIGHT ANKLE ARTHROSCOPIC DEBRIDEMENT;  Surgeon: Newt Minion, MD;  Location: [facility];  Service: Orthopedics;  Laterality: Right;  . BIOPSY  01/19/2019   Procedure: BIOPSY;  Surgeon: Rush Landmark Telford Nab., MD;  Location: [facility];  Service: Gastroenterology;;  . CHOLECYSTECTOMY N/A 07/28/2016   Procedure: LAPAROSCOPIC CHOLECYSTECTOMY WITH INTRAOPERATIVE CHOLANGIOGRAM;  Surgeon: Mickeal Skinner, MD;  Location: [facility];  Service: General;   Laterality: N/A;  . ESOPHAGOGASTRODUODENOSCOPY (EGD) WITH PROPOFOL N/A 08/13/2018   Procedure: ESOPHAGOGASTRODUODENOSCOPY (EGD) WITH PROPOFOL;  Surgeon: Irving Copas., MD;  Location: [facility];  Service: Gastroenterology;  Laterality: N/A;  . ESOPHAGOGASTRODUODENOSCOPY (EGD) WITH PROPOFOL N/A 01/19/2019   Procedure: ESOPHAGOGASTRODUODENOSCOPY (EGD) WITH PROPOFOL;  Surgeon: Rush Landmark Telford Nab., MD;  Location: [facility];  Service: Gastroenterology;  Laterality: N/A;  . IR PARACENTESIS  08/08/2020  . IR PARACENTESIS  03/13/2021  . LEEP  1990's  . ORIF FOOT FRACTURE  09/2008   L 5th metatarsal  . REFRACTIVE SURGERY  2000  . TIBIA IM NAIL INSERTION Left 03/12/2021   Procedure: INTRAMEDULLARY (IM) NAIL TIBIAL;  Surgeon: Kerrin Champagne, MD;  Location[facility];  Service: Orthopedics;  Laterality: Left;  . TONSILLECTOMY    . UPPER ESOPHAGEAL ENDOSCOPIC ULTRASOUND (EUS) N/A 08/13/2018   Procedure: UPPER ESOPHAGEAL ENDOSCOPIC ULTRASOUND (EUS);  Surgeon: Lemar Lofty., MD;  [facility] ENDOSCOPY;  Service: Gastroenterology;  Laterality: N/A;  . UPPER ESOPHAGEAL ENDOSCOPIC ULTRASOUND (EUS) N/A 01/19/2019   Procedure: UPPER ESOPHAGEAL ENDOSCOPIC ULTRASOUND (EUS);  Surgeon: Lemar Lofty., MD;  Location: [facility] ENDOSCOPY;  Service: Gastroenterology;  Laterality: N/A;   Social History   Occupational History  . 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 Smoking status: Never  . Smokeless tobacco: Never  Vaping Use  . Vaping Use: Never used  Substance and Sexual Activity  . Alcohol use: Not Currently    Alcohol/week: 7.0 standard drinks of alcohol    Types: 7 Glasses of wine per week    Comment: wine  . Drug use: No  . Sexual activity: Not on file

## 2021-10-27 ENCOUNTER — Telehealth: Payer: Self-pay | Admitting: Specialist

## 2021-11-01 ENCOUNTER — Telehealth: Payer: Self-pay

## 2021-11-01 NOTE — Telephone Encounter (Signed)
Patient has been having high blood sugars in the morning and would like to know if she needs to adjust her medication.   6/28  7am- 385  10/31/21  7am-365   6/26   7am-360  25 units of Basaglar in morning and Glipizide

## 2021-11-01 NOTE — Telephone Encounter (Signed)
Patient advise and will increase the insulin. Appointment scheduled

## 2021-11-02 ENCOUNTER — Ambulatory Visit (INDEPENDENT_AMBULATORY_CARE_PROVIDER_SITE_OTHER): Payer: 59 | Admitting: Orthopedic Surgery

## 2021-11-02 DIAGNOSIS — M222X2 Patellofemoral disorders, left knee: Secondary | ICD-10-CM

## 2021-11-03 ENCOUNTER — Encounter: Payer: Self-pay | Admitting: Orthopedic Surgery

## 2021-11-03 NOTE — Progress Notes (Signed)
Office Visit Note   Patient: Mallory Stewart           Date of Birth: 03/13/1961           MRN: [id number] Visit Date: 11/02/2021              Requested by: Excell Seltzer, MD [address] [place],  [place] [address] PCP: Excell Seltzer, MD  Chief Complaint  Patient presents with   Left Knee - Pain    Second opinion       HPI: Patient is a 61 year old woman who is seen for initial evaluation for left knee pain.  Patient has had radiographs and an MRI scan.  Patient has had 1 steroid injection.  Patient complains of pain over the patella tendon and over the patella.  She is status post intramedullary nailing for a tibial shaft fracture.  Assessment & Plan: Visit Diagnoses:  1. Patellofemoral pain syndrome of left knee     Plan: Patient was given instructions and recommended VMO strengthening as well as isometric strengthening and this was demonstrated.  Do not recommend arthroscopic debridement or total knee replacement.  Follow-Up Instructions: Return if symptoms worsen or fail to improve.   Ortho Exam  Patient is alert, oriented, no adenopathy, well-dressed, normal affect, normal respiratory effort. Examination patient has crepitation in the patellofemoral joint with range of motion palpation along the medial lateral patella facets reproduces her pain.  She does have quadriceps atrophy.  Patient is also tender to palpation over the patella tendon.  Review of the MRI scan shows stable joint line.  Imaging: No results found. No images are attached to the encounter.  Labs: Lab Results  Component Value Date   HGBA1C 7.9 (H) 03/12/2021   HGBA1C 7.8 (H) 03/12/2021   HGBA1C 5.8 06/16/2020   CRP 6.9 07/09/2017   REPTSTATUS 08/09/2020 FINAL 08/08/2020   REPTSTATUS 08/13/2020 FINAL 08/08/2020   GRAMSTAIN  08/08/2020    RARE WBC PRESENT, PREDOMINANTLY MONONUCLEAR NO ORGANISMS SEEN Performed at [facility], [address] [place], [place] [address]     CULT  08/08/2020    NO GROWTH 5 DAYS Performed at [facility], [address], [place], [place] [address]      Lab Results  Component Value Date   ALBUMIN 3.9 08/07/2021   ALBUMIN 4.2 08/07/2021   ALBUMIN 3.7 07/04/2021    Lab Results  Component Value Date   MG 1.6 (L) 03/14/2021   MG 1.5 08/18/2020   MG 1.8 08/08/2020   Lab Results  Component Value Date   VD25OH 44.82 07/04/2021   VD25OH 26.51 (L) 03/12/2021   VD25OH 43.63 08/24/2020    No results found for: "PREALBUMIN"    Latest Ref Rng & Units 08/07/2021    4:45 PM 08/07/2021   12:48 PM 07/04/2021    9:40 AM  CBC EXTENDED  WBC 4.0 - 10.5 K/uL 5.5  5.5  5.2   RBC 3.87 - 5.11 MIL/uL 3.63  3.78  3.84   Hemoglobin 12.0 - 15.0 g/dL 25.9  56.3  87.5   HCT 36.0 - 46.0 % 31.2  33.1  33.4   Platelets 150 - 400 K/uL 149  161.0  165.0   NEUT# 1.7 - 7.7 K/uL 4.0  4.2  3.8   Lymph# 0.7 - 4.0 K/uL 1.1  0.9  0.9      There is no height or weight on file to calculate BMI.  Orders:  No  orders of the defined types were placed in this encounter.  No orders of the defined types were placed in this encounter.    Procedures: No procedures performed  Clinical Data: No additional findings.  ROS:  All other systems negative, except as noted in the HPI. Review of Systems  Objective: Vital Signs: There were no vitals taken for this visit.  Specialty Comments:  No specialty comments available.  PMFS History: Patient Active Problem List   Diagnosis Date Noted   Stage 3b chronic kidney disease (CKD) (HCC) - Baseline creatinine approximately 1.7 08/07/2021   Balance problem 07/04/2021   Confusion 07/04/2021   Vision changes 07/04/2021   H/O multiple concussions 07/04/2021   Type 2 diabetes mellitus with hyperglycemia, with long-term current use of insulin (HCC) 03/24/2021   Secondary esophageal varices without bleeding (HCC)    Closed fracture of distal end of left tibia, initial encounter 03/12/2021    Class:  Acute   Closed fracture of proximal fibula 03/12/2021    Class: Acute   Displaced spiral fracture of shaft of left tibia, initial encounter for closed fracture 03/12/2021   Impingement of right ankle joint    Gastric varices without bleeding 12/06/2020   Portal hypertension (HCC) 09/30/2020   Dilated bile duct 09/30/2020   Alcoholic cirrhosis of liver with ascites (HCC) 09/29/2020   Right foot pain 09/17/2020   Laceration of lesser toe of right foot without foreign body present or damage to nail 09/17/2020   Frequent falls 09/14/2020   Closed fracture of ankle 09/14/2020   Foot sprain, right, subsequent encounter 08/26/2020   Sprain of anterior talofibular ligament of right ankle 08/26/2020   Splenomegaly 08/09/2020   Malnutrition of moderate degree (HCC) 08/08/2020   Chronic hyponatremia 08/06/2020   Transaminitis 08/06/2020   Serum total bilirubin elevated 08/06/2020   Fall at home, initial encounter 08/06/2020   Generalized weakness 08/05/2020   Idiopathic peripheral neuropathy 07/13/2020   Type 2 diabetes mellitus with diabetic polyneuropathy, with long-term current use of insulin (HCC) 07/13/2020   Persistent cough for 3 weeks or longer 07/04/2020   Low HDL (under 40) 06/21/2020   Post-nasal drip 06/21/2020   Allergic rhinitis 06/21/2020   Elevated alkaline phosphatase level 06/21/2020   Duodenitis - edema ? andgioedema 10/30/2018   Chronic alcoholic pancreatitis (HCC) 09/02/2018   Diabetes mellitus with circulatory complication, HTN (HCC) 08/22/2018   Dizziness 12/26/2017   Chronic pain 05/17/2017   Osteoarthritis of spine with radiculopathy, cervical region 09/17/2016   Hyperkalemia 03/02/2016   B12 deficiency 10/21/2014   Neuropathic pain of both legs 02/11/2014   Hepatic steatosis 04/27/2013   GAD (generalized anxiety disorder) 09/23/2009   Hyperlipidemia associated with type 2 diabetes mellitus (HCC) 11/03/2008   Hypertension associated with diabetes (HCC)  09/29/2008   PAP SMEAR, ABNORMAL 09/29/2008   Past Medical History:  Diagnosis Date   Alcohol-induced chronic pancreatitis (HCC)    Alcoholic cirrhosis (HCC)    B12 deficiency    Concussion    DDD (degenerative disc disease), cervical    Diabetes (HCC)    DKA, type 2 (HCC)    Gastric outlet obstruction 08/12/2018   Hypertension    Neuropathy    Pancreatic pseudocyst/cyst 05/06/2013   Seasonal allergies     Family History  Problem Relation Age of Onset   Other Mother        tachycardia.Marland KitchenMarland Kitchen?afib   Stroke Father        after hernia suegery   Prostate cancer Father    Other  Father        global transient amnesia, unclear source   Atrial fibrillation Sister    Healthy Brother    Healthy Brother    Coronary artery disease Paternal Grandmother    Heart attack Paternal Grandmother 44   Brain cancer Maternal Grandfather        ?   Cancer Paternal Grandfather        ?   Breast cancer Maternal Grandmother     Past Surgical History:  Procedure Laterality Date   ANKLE ARTHROSCOPY Right 12/20/2020   Procedure: RIGHT ANKLE ARTHROSCOPIC DEBRIDEMENT;  Surgeon: Newt Minion, MD;  Location: [facility];  Service: Orthopedics;  Laterality: Right;   BIOPSY  01/19/2019   Procedure: BIOPSY;  Surgeon: Rush Landmark Telford Nab., MD;  Location: [facility];  Service: Gastroenterology;;   CHOLECYSTECTOMY N/A 07/28/2016   Procedure: LAPAROSCOPIC CHOLECYSTECTOMY WITH INTRAOPERATIVE CHOLANGIOGRAM;  Surgeon: Mickeal Skinner, MD;  Location: [facility];  Service: General;  Laterality: N/A;   ESOPHAGOGASTRODUODENOSCOPY (EGD) WITH PROPOFOL N/A 08/13/2018   Procedure: ESOPHAGOGASTRODUODENOSCOPY (EGD) WITH PROPOFOL;  Surgeon: Irving Copas., MD;  Location: [facility];  Service: Gastroenterology;  Laterality: N/A;   ESOPHAGOGASTRODUODENOSCOPY (EGD) WITH PROPOFOL N/A 01/19/2019   Procedure: ESOPHAGOGASTRODUODENOSCOPY (EGD) WITH PROPOFOL;  Surgeon: Rush Landmark Telford Nab., MD;  Location:  [facility];  Service: Gastroenterology;  Laterality: N/A;   IR PARACENTESIS  08/08/2020   IR PARACENTESIS  03/13/2021   LEEP  1990's   ORIF FOOT FRACTURE  09/2008   L 5th metatarsal   REFRACTIVE SURGERY  2000   TIBIA IM NAIL INSERTION Left 03/12/2021   Procedure: INTRAMEDULLARY (IM) NAIL TIBIAL;  Surgeon: Jessy Oto, MD;  Location: [facility];  Service: Orthopedics;  Laterality: Left;   TONSILLECTOMY     UPPER ESOPHAGEAL ENDOSCOPIC ULTRASOUND (EUS) N/A 08/13/2018   Procedure: UPPER ESOPHAGEAL ENDOSCOPIC ULTRASOUND (EUS);  Surgeon: Irving Copas., MD;  Location: [facility];  Service: Gastroenterology;  Laterality: N/A;   UPPER ESOPHAGEAL ENDOSCOPIC ULTRASOUND (EUS) N/A 01/19/2019   Procedure: UPPER ESOPHAGEAL ENDOSCOPIC ULTRASOUND (EUS);  Surgeon: Irving Copas., MD;  Location: [facility];  Service: Gastroenterology;  Laterality: N/A;   Social History   Occupational History   Occupation: Mudlogger of business development  Tobacco Use   Smoking status: Never   Smokeless tobacco: Never  Vaping Use   Vaping Use: Never used  Substance and Sexual Activity   Alcohol use: Not Currently    Alcohol/week: 7.0 standard drinks of alcohol    Types: 7 Glasses of wine per week    Comment: wine   Drug use: No   Sexual activity: Not on file

## 2021-11-20 ENCOUNTER — Encounter: Payer: Self-pay | Admitting: Internal Medicine

## 2021-11-20 ENCOUNTER — Telehealth: Payer: Self-pay

## 2021-11-20 ENCOUNTER — Ambulatory Visit (INDEPENDENT_AMBULATORY_CARE_PROVIDER_SITE_OTHER): Payer: 59 | Admitting: Internal Medicine

## 2021-11-20 VITALS — BP 112/70 | HR 74 | Ht 69.0 in | Wt 139.0 lb

## 2021-11-20 DIAGNOSIS — Z794 Long term (current) use of insulin: Secondary | ICD-10-CM | POA: Diagnosis not present

## 2021-11-20 DIAGNOSIS — N3000 Acute cystitis without hematuria: Secondary | ICD-10-CM | POA: Diagnosis not present

## 2021-11-20 DIAGNOSIS — E1142 Type 2 diabetes mellitus with diabetic polyneuropathy: Secondary | ICD-10-CM

## 2021-11-20 DIAGNOSIS — R300 Dysuria: Secondary | ICD-10-CM | POA: Diagnosis not present

## 2021-11-20 DIAGNOSIS — E1165 Type 2 diabetes mellitus with hyperglycemia: Secondary | ICD-10-CM

## 2021-11-20 LAB — POCT GLYCOSYLATED HEMOGLOBIN (HGB A1C): Hemoglobin A1C: 12.1 % — AB (ref 4.0–5.6)

## 2021-11-20 LAB — POCT GLUCOSE (DEVICE FOR HOME USE): POC Glucose: 588 mg/dl — AB (ref 70–99)

## 2021-11-20 MED ORDER — BASAGLAR KWIKPEN 100 UNIT/ML ~~LOC~~ SOPN: 36.0000 [IU] | PEN_INJECTOR | Freq: Every day | SUBCUTANEOUS | 3 refills | Status: DC

## 2021-11-20 MED ORDER — NOVOLOG FLEXPEN 100 UNIT/ML ~~LOC~~ SOPN: PEN_INJECTOR | SUBCUTANEOUS | 3 refills | Status: DC

## 2021-11-20 MED ORDER — INSULIN PEN NEEDLE 32G X 4 MM MISC: 1.0000 | Freq: Four times a day (QID) | 3 refills | Status: DC

## 2021-11-20 NOTE — Progress Notes (Unsigned)
Name: Mallory Stewart  Age/ Sex: 61 y.o., female   MRN/ DOB: [id number], Oct 16, 1960     PCP: [name], MD   Reason for Endocrinology Evaluation: Type 2 Diabetes Mellitus  Initial Endocrine Consultative Visit: 08/22/2018    PATIENT IDENTIFIER: Mallory Stewart is a 61 y.o. female with a past medical history of HTN, T2DM , chronic pancreatitis and gastric outlet obstruction and alcoholic cirrhosis. The patient has followed with Endocrinology clinic since 08/22/2018 for consultative assistance with management of her diabetes.  DIABETIC HISTORY:  Mallory Stewart was with  T2DM in 2010.Pt had hospital admission in 2014 for pancreatitis secondary to ETOH intake, with another one in 2018 and 2020.  Insulin started in 07/2018 . Her hemoglobin A1c has ranged from 5.4% in 2016, peaking at 13.3%  In 2020.  Prandial insulin started 08/2018 Wilder Glade added in 09/2018 but stopped due to cost 2023  SUBJECTIVE:   During the last visit (03/24/2021): This was a virtual visit       Today (11/20/2021): Mallory Stewart is here for a follow up on diabetes management. She has not been here in 8 months.  She contacted our office ~  3 weeks ago with hyperglycemia.  She checks her blood sugars 1 times daily. The patient has not  hypoglycemic episodes since the last clinic visit, which typically occur in the morning.   She was seen by Ortho on 11/02/2021 for second opinion on left knee pain, the patient has received intra-articular injections   Believes has UTI , had a vomit ing episode yesterday but has urgency and dysuria  Has sugar free ice pop and milk shake before her visit today    HOME DIABETES REGIMEN:  Basaglar 30 units daily  Glipizide 5 mg daily     Statin: No ACE-I/ARB: yes    GLUCOSE LOG:  Fasting 69-170 mg/DL     DIABETIC COMPLICATIONS: Microvascular complications:  Neuropathic , S/P cataract sx Denies: CKD Last Eye Exam: Completed 2023  Macrovascular  complications:  Denies: CAD, CVA, PVD   HISTORY:  Past Medical History:  Past Medical History:  Diagnosis Date   Alcohol-induced chronic pancreatitis ([place])    Alcoholic cirrhosis ([place])    B12 deficiency    Concussion    DDD (degenerative disc disease), cervical    Diabetes ([place])    DKA, type 2 ([place])    Gastric outlet obstruction 08/12/2018   Hypertension    Neuropathy    Pancreatic pseudocyst/cyst 05/06/2013   Seasonal allergies    Past Surgical History:  Past Surgical History:  Procedure Laterality Date   ANKLE ARTHROSCOPY Right 12/20/2020   Procedure: RIGHT ANKLE ARTHROSCOPIC DEBRIDEMENT;  Surgeon: Newt Minion, MD;  Location: [place];  Service: Orthopedics;  Laterality: Right;   BIOPSY  01/19/2019   Procedure: BIOPSY;  Surgeon: Rush Landmark Telford Nab., MD;  Location: [place];  Service: Gastroenterology;;   CHOLECYSTECTOMY N/A 07/28/2016   Procedure: LAPAROSCOPIC CHOLECYSTECTOMY WITH INTRAOPERATIVE CHOLANGIOGRAM;  Surgeon: Mickeal Skinner, MD;  Location: [facility];  Service: General;  Laterality: N/A;   ESOPHAGOGASTRODUODENOSCOPY (EGD) WITH PROPOFOL N/A 08/13/2018   Procedure: ESOPHAGOGASTRODUODENOSCOPY (EGD) WITH PROPOFOL;  Surgeon: Irving Copas., MD;  Location: [place];  Service: Gastroenterology;  Laterality: N/A;   ESOPHAGOGASTRODUODENOSCOPY (EGD) WITH PROPOFOL N/A 01/19/2019   Procedure: ESOPHAGOGASTRODUODENOSCOPY (EGD) WITH PROPOFOL;  Surgeon: Rush Landmark Telford Nab., MD;  Location: [facility];  Service: Gastroenterology;  Laterality: N/A;   IR PARACENTESIS  08/08/2020  IR PARACENTESIS  03/13/2021   LEEP  1990's   ORIF FOOT FRACTURE  09/2008   L 5th metatarsal   REFRACTIVE SURGERY  2000   TIBIA IM NAIL INSERTION Left 03/12/2021   Procedure: INTRAMEDULLARY (IM) NAIL TIBIAL;  Surgeon: Jessy Oto, MD;  Location: [facility];  Service: Orthopedics;  Laterality: Left;   TONSILLECTOMY     UPPER ESOPHAGEAL ENDOSCOPIC ULTRASOUND (EUS) N/A  08/13/2018   Procedure: UPPER ESOPHAGEAL ENDOSCOPIC ULTRASOUND (EUS);  Surgeon: Irving Copas., MD;  Location: [place];  Service: Gastroenterology;  Laterality: N/A;   UPPER ESOPHAGEAL ENDOSCOPIC ULTRASOUND (EUS) N/A 01/19/2019   Procedure: UPPER ESOPHAGEAL ENDOSCOPIC ULTRASOUND (EUS);  Surgeon: Irving Copas., MD;  Location: [place] [facility];  Service: Gastroenterology;  Laterality: N/A;   Social History:  reports that she has never smoked. She has never used smokeless tobacco. She reports that she does not currently use alcohol after a past usage of about 7.0 standard drinks of alcohol per week. She reports that she does not use drugs. Family History:  Family History  Problem Relation Age of Onset   Other Mother        tachycardia.Marland KitchenMarland Kitchen?afib   Stroke Father        after hernia suegery   Prostate cancer Father    Other Father        global transient amnesia, unclear source   Atrial fibrillation Sister    Healthy Brother    Healthy Brother    Coronary artery disease Paternal Grandmother    Heart attack Paternal Grandmother 21   Brain cancer Maternal Grandfather        ?   Cancer Paternal Grandfather        ?   Breast cancer Maternal Grandmother      HOME MEDICATIONS: Allergies as of 11/20/2021       Reactions   Gluten Meal Other (See Comments)   Stomach distress   Zocor [simvastatin] Other (See Comments)   Elevated lfts?        Medication List        Accurate as of November 20, 2021  4:11 PM. If you have any questions, ask your nurse or doctor.          STOP taking these medications    empagliflozin 25 MG Tabs tablet Commonly known as: Jardiance Stopped by: Dorita Sciara, MD   glipiZIDE 5 MG tablet Commonly known as: GLUCOTROL Stopped by: Dorita Sciara, MD       TAKE these medications    ALPRAZolam 0.25 MG tablet Commonly known as: XANAX TAKE 1 TABLET(0.25 MG) BY MOUTH DAILY AS NEEDED FOR ANXIETY   amitriptyline 75 MG  tablet Commonly known as: ELAVIL Take 1 tablet (75 mg total) by mouth at bedtime.   aspirin EC 81 MG tablet Take 1 tablet (81 mg total) by mouth 2 (two) times daily. Swallow whole.   Basaglar KwikPen 100 UNIT/ML Inject 30 Units into the skin daily.   carvedilol 12.5 MG tablet Commonly known as: COREG TAKE 1 TABLET(12.5 MG) BY MOUTH TWICE DAILY WITH A MEAL   cetirizine 10 MG tablet Commonly known as: ZYRTEC Take 10 mg by mouth at bedtime.   FeroSul 325 (65 FE) MG tablet Generic drug: ferrous sulfate TAKE 1 TABLET(325 MG) BY MOUTH TWICE DAILY WITH A MEAL   furosemide 20 MG tablet Commonly known as: LASIX Take 1 tablet (20 mg total) by mouth daily.   Insulin Pen Needle 32G X 4 MM Misc 1 Device  by Does not apply route in the morning, at noon, in the evening, and at bedtime. What changed: when to take this Changed by: Dorita Sciara, MD   multivitamin with minerals Tabs tablet Take 1 tablet by mouth daily.   NovoLOG FlexPen 100 UNIT/ML FlexPen Generic drug: insulin aspart Max daily 30 units Started by: Dorita Sciara, MD   ONE TOUCH ULTRA 2 w/Device Kit Use to check blood sugars two times day   OneTouch Ultra test strip Generic drug: glucose blood CHECK BLOOD SUGAR TWICE DAILY AS DIRECTED   onetouch ultrasoft lancets Use to check blood sugar two times a day.   oxyCODONE 5 MG immediate release tablet Commonly known as: Oxy IR/ROXICODONE Take 1-2 tablets (5-10 mg total) by mouth 2 (two) times daily.   pantoprazole 40 MG tablet Commonly known as: PROTONIX TAKE 1 TABLET BY MOUTH TWICE DAILY BEFORE A MEAL           OBJECTIVE:   Vital Signs: BP 112/70 (BP Location: Left Arm, Patient Position: Sitting, Cuff Size: Small)   Pulse 74   Ht $R'5\' 9"'DL$  (1.753 m)   Wt 139 lb (63 kg)   SpO2 99%   BMI 20.53 kg/m    Exam: General: Pt appears well and is in NAD  Neck: General: Supple without adenopathy. Thyroid: Thyroid size normal.  No goiter or nodules  appreciated.   Lungs: Clear with good BS bilat   Heart: RRR   Abdomen: Normoactive bowel sounds, soft, nontender, without masses or organomegaly palpable  Extremities: No pretibial edema.   Neuro: MS is good with appropriate affect, pt is alert and Ox3   DM Foot Exam 11/20/2021  The skin of the feet is intact without sores or ulcerations. The pedal pulses are 2+ on right and 2+ on left. The sensation is absent  to a screening 5.07, 10 gram monofilament bilaterally    DATA REVIEWED:  Lab Results  Component Value Date   HGBA1C 12.1 (A) 11/20/2021   HGBA1C 7.9 (H) 03/12/2021   HGBA1C 7.8 (H) 03/12/2021   Lab Results  Component Value Date   MICROALBUR 1.6 08/19/2019   LDLCALC 82 10/16/2018   CREATININE 1.12 08/15/2021   Lab Results  Component Value Date   MICRALBCREAT 6.6 08/19/2019     Lab Results  Component Value Date   CHOL 168 06/16/2020   HDL 18.10 (L) 06/16/2020   LDLCALC 82 10/16/2018   LDLDIRECT 129.0 06/16/2020   TRIG 236.0 (H) 06/16/2020   CHOLHDL 9 06/16/2020         Latest Reference Range & Units 08/15/21 10:20  Sodium 135 - 145 mEq/L 135  Potassium 3.5 - 5.1 mEq/L 4.2  Chloride 96 - 112 mEq/L 100  CO2 19 - 32 mEq/L 27  Glucose 70 - 99 mg/dL 139 (H)  BUN 6 - 23 mg/dL 20  Creatinine 0.40 - 1.20 mg/dL 1.12  Calcium 8.4 - 10.5 mg/dL 9.5  GFR >60.00 mL/min 54.18 (L)    ASSESSMENT / PLAN / RECOMMENDATIONS:   1) Type 2 Diabetes Mellitus, poorly controlled , With Neuropathic complications - Most recent A1c of 12.5 %. Goal A1c < 7.0 %.     -Patient with severe hyperglycemia, in office BG 588 mg/DL, patient is asymptomatic at this time -I have suggested starting prandial dose of insulin which she is in agreement of -We will stop glipizide at this time -We discussed CGM technology but she is not keen on this at this time - She is intolerant  to Metformin - She is not a candidate for GLP-1 agonists, or DPP-4 inhibitors due to hx of pancreatitis.   -Vania Rea has been cost prohibitive since losing her job earlier in 2023 -I discussed with the patient the importance of scheduling follow-up appointments and not waiting to the last minute to contact us with hyperglycemia due to shortage in providers  -I have advised the patient to use 6 units of NovoLog before each meal, but if she is going to skip a meal then she needs to skip the standing NovoLog dose of 6 units but she may continue to use the correction scale as needed for hyperglycemia  MEDICATIONS: -Stop glipizide -Increase Basaglar to 36 units daily -Start NovoLog 6 units 3 times daily before every meal -Correction factor : NovoLog (BG -130/30)    EDUCATION / INSTRUCTIONS: BG monitoring instructions: Patient is instructed to check her blood sugars 3 times a day before each meal Call [facility] if: BG persistently < 70  I reviewed the Rule of 15 for the treatment of hypoglycemia in detail with the patient. Literature supplied.   2) Diabetic complications:  Eye: Does not have known diabetic retinopathy.  Neuro/ Feet: Does have known diabetic peripheral neuropathy. Renal: Patient does not have known baseline CKD. She is on an ACEI/ARB at present.     3)Dysuria:   - ***  F/U in 3 months   Signed electronically by: Mack Guise, MD  [facility]  [facility] [address] [place], [place] [address] Phone: [phone] FAX: [phone]   CC: [name], MD [place] [address] Phone: [phone]  Fax: [phone]  Return to Endocrinology clinic as below: Future Appointments  Date Time Provider [place]  11/23/2021 10:10 AM Gatha Mayer, MD [facility]  04/06/2022  9:50 AM Jarel Cuadra, Melanie Crazier, MD [facility] None

## 2021-11-20 NOTE — Telephone Encounter (Signed)
Patient states that she has increase the Basaglar to 30 units and her sugar are still running between  325-375 consistently. Patient is scheduled for follow up in September.

## 2021-11-20 NOTE — Patient Instructions (Addendum)
STOP Glipizide  Increase Basaglar to 36 units daily  Novolog 6 units with each meal ( Breakfast, Lunch and Supper ) Novolog correctional insulin: ADD extra units on insulin to your meal-time Novolog dose if your blood sugars are higher than 160. Use the scale below to help guide you:   Blood sugar before meal Number of units to inject  Less than 160 0 unit  161 -  190 1 units  191 -  220 2 units  221 -  250 3 units  251 -  280 4 units  281 -  310 5 units  311 -  340 6 units  341 -  370 7 units  371 -  400 8 units  401 - 430 9 units   431 - 460 10 units     HOW TO TREAT LOW BLOOD SUGARS (Blood sugar LESS THAN 70 MG/DL) Please follow the RULE OF 15 for the treatment of hypoglycemia treatment (when your (blood sugars are less than 70 mg/dL)   STEP 1: Take 15 grams of carbohydrates when your blood sugar is low, which includes:  3-4 GLUCOSE TABS  OR 3-4 OZ OF JUICE OR REGULAR SODA OR ONE TUBE OF GLUCOSE GEL    STEP 2: RECHECK blood sugar in 15 MINUTES STEP 3: If your blood sugar is still low at the 15 minute recheck --> then, go back to STEP 1 and treat AGAIN with another 15 grams of carbohydrates.

## 2021-11-21 ENCOUNTER — Telehealth: Payer: Self-pay

## 2021-11-21 ENCOUNTER — Telehealth: Payer: Self-pay | Admitting: Internal Medicine

## 2021-11-21 LAB — BASIC METABOLIC PANEL
BUN: 30 mg/dL — ABNORMAL HIGH (ref 6–23)
CO2: 25 mEq/L (ref 19–32)
Calcium: 10.2 mg/dL (ref 8.4–10.5)
Chloride: 84 mEq/L — ABNORMAL LOW (ref 96–112)
Creatinine, Ser: 1.26 mg/dL — ABNORMAL HIGH (ref 0.40–1.20)
GFR: 46.95 mL/min — ABNORMAL LOW (ref >60.00)
Glucose, Bld: 572 mg/dL (ref 70–99)
Potassium: 5.8 mEq/L — ABNORMAL HIGH (ref 3.5–5.1)
Sodium: 115 mEq/L — CL (ref 135–145)

## 2021-11-21 MED ORDER — CIPROFLOXACIN HCL 500 MG PO TABS: 500.0000 mg | ORAL_TABLET | Freq: Two times a day (BID) | ORAL | 0 refills | Status: AC

## 2021-11-21 NOTE — Telephone Encounter (Signed)
Please let the patient know that her urine test shows that she has an infection   Ciprofloxacin 500 mg twice daily for 5 days has been sent to the pharmacy   If her symptoms persist patient will need to follow-up with PCP

## 2021-11-21 NOTE — Telephone Encounter (Signed)
Critical lab results of:  Sodium- 115 Glucose- 572

## 2021-11-21 NOTE — Telephone Encounter (Signed)
Patient notified and verbalized understanding. 

## 2021-11-21 NOTE — Telephone Encounter (Signed)
Noted Corrected sodium 126 mmol/L , hyponatremia due to liver cirrhosis

## 2021-11-22 ENCOUNTER — Telehealth: Payer: Self-pay

## 2021-11-22 NOTE — Telephone Encounter (Signed)
Patient needs a PA on Novolog

## 2021-11-23 ENCOUNTER — Other Ambulatory Visit (HOSPITAL_COMMUNITY): Payer: Self-pay

## 2021-11-23 ENCOUNTER — Ambulatory Visit: Payer: 59 | Admitting: Internal Medicine

## 2021-11-23 ENCOUNTER — Encounter (HOSPITAL_COMMUNITY): Payer: Self-pay | Admitting: Emergency Medicine

## 2021-11-23 ENCOUNTER — Other Ambulatory Visit: Payer: Self-pay

## 2021-11-23 ENCOUNTER — Emergency Department (HOSPITAL_COMMUNITY)
Admission: EM | Admit: 2021-11-23 | Discharge: 2021-11-23 | Disposition: A | Discharge status: Home / Self Care | Payer: 59 | Attending: Emergency Medicine | Admitting: Emergency Medicine

## 2021-11-23 ENCOUNTER — Ambulatory Visit (INDEPENDENT_AMBULATORY_CARE_PROVIDER_SITE_OTHER): Payer: 59 | Admitting: Family

## 2021-11-23 ENCOUNTER — Encounter: Payer: Self-pay | Admitting: Family

## 2021-11-23 VITALS — BP 100/70 | HR 85 | Temp 98.6°F | Resp 16 | Ht 69.0 in | Wt 139.0 lb

## 2021-11-23 DIAGNOSIS — E119 Type 2 diabetes mellitus without complications: Secondary | ICD-10-CM | POA: Diagnosis not present

## 2021-11-23 DIAGNOSIS — J0110 Acute frontal sinusitis, unspecified: Secondary | ICD-10-CM

## 2021-11-23 DIAGNOSIS — R42 Dizziness and giddiness: Secondary | ICD-10-CM

## 2021-11-23 DIAGNOSIS — Z79899 Other long term (current) drug therapy: Secondary | ICD-10-CM | POA: Diagnosis not present

## 2021-11-23 DIAGNOSIS — E871 Hypo-osmolality and hyponatremia: Secondary | ICD-10-CM

## 2021-11-23 DIAGNOSIS — Z794 Long term (current) use of insulin: Secondary | ICD-10-CM | POA: Diagnosis not present

## 2021-11-23 DIAGNOSIS — J029 Acute pharyngitis, unspecified: Secondary | ICD-10-CM | POA: Diagnosis not present

## 2021-11-23 DIAGNOSIS — R29898 Other symptoms and signs involving the musculoskeletal system: Secondary | ICD-10-CM

## 2021-11-23 DIAGNOSIS — I10 Essential (primary) hypertension: Secondary | ICD-10-CM | POA: Diagnosis not present

## 2021-11-23 DIAGNOSIS — H8111 Benign paroxysmal vertigo, right ear: Secondary | ICD-10-CM

## 2021-11-23 DIAGNOSIS — Z7982 Long term (current) use of aspirin: Secondary | ICD-10-CM | POA: Diagnosis not present

## 2021-11-23 LAB — URINALYSIS W MICROSCOPIC + REFLEX CULTURE
Bilirubin Urine: NEGATIVE
Hyaline Cast: NONE SEEN /LPF
Ketones, ur: NEGATIVE
Nitrites, Initial: POSITIVE — AB
Protein, ur: NEGATIVE
Specific Gravity, Urine: 1.022 (ref 1.001–1.035)
Squamous Epithelial / HPF: NONE SEEN /HPF (ref <=5)
WBC, UA: >=60 /HPF — AB (ref 0–5)
pH: <=5 (ref 5.0–8.0)

## 2021-11-23 LAB — I-STAT CHEM 8, ED
BUN: 33 mg/dL — ABNORMAL HIGH (ref 6–20)
Calcium, Ion: 1.19 mmol/L (ref 1.15–1.40)
Chloride: 89 mmol/L — ABNORMAL LOW (ref 98–111)
Creatinine, Ser: 0.9 mg/dL (ref 0.44–1.00)
Glucose, Bld: 325 mg/dL — ABNORMAL HIGH (ref 70–99)
HCT: 38 % (ref 36.0–46.0)
Hemoglobin: 12.9 g/dL (ref 12.0–15.0)
Potassium: 4.6 mmol/L (ref 3.5–5.1)
Sodium: 123 mmol/L — ABNORMAL LOW (ref 135–145)
TCO2: 25 mmol/L (ref 22–32)

## 2021-11-23 LAB — URINALYSIS, ROUTINE W REFLEX MICROSCOPIC
Bilirubin Urine: NEGATIVE
Glucose, UA: 50 mg/dL — AB
Hgb urine dipstick: NEGATIVE
Ketones, ur: NEGATIVE mg/dL
Nitrite: NEGATIVE
Protein, ur: NEGATIVE mg/dL
Specific Gravity, Urine: 1.012 (ref 1.005–1.030)
pH: 5 (ref 5.0–8.0)

## 2021-11-23 LAB — CBC WITH DIFFERENTIAL/PLATELET
Abs Immature Granulocytes: 0.14 10*3/uL — ABNORMAL HIGH (ref 0.00–0.07)
Basophils Absolute: 0 10*3/uL (ref 0.0–0.1)
Basophils Relative: 0 %
Eosinophils Absolute: 0 10*3/uL (ref 0.0–0.5)
Eosinophils Relative: 0 %
HCT: 34.9 % — ABNORMAL LOW (ref 36.0–46.0)
Hemoglobin: 12.1 g/dL (ref 12.0–15.0)
Immature Granulocytes: 1 %
Lymphocytes Relative: 5 %
Lymphs Abs: 0.7 10*3/uL (ref 0.7–4.0)
MCH: 29 pg (ref 26.0–34.0)
MCHC: 34.7 g/dL (ref 30.0–36.0)
MCV: 83.7 fL (ref 80.0–100.0)
Monocytes Absolute: 1 10*3/uL (ref 0.1–1.0)
Monocytes Relative: 8 %
Neutro Abs: 10.5 10*3/uL — ABNORMAL HIGH (ref 1.7–7.7)
Neutrophils Relative %: 86 %
Platelets: 119 10*3/uL — ABNORMAL LOW (ref 150–400)
RBC: 4.17 MIL/uL (ref 3.87–5.11)
RDW: 12.1 % (ref 11.5–15.5)
WBC: 12.3 10*3/uL — ABNORMAL HIGH (ref 4.0–10.5)
nRBC: 0 % (ref 0.0–0.2)

## 2021-11-23 LAB — HEPATIC FUNCTION PANEL
ALT: 15 U/L (ref 0–44)
AST: 21 U/L (ref 15–41)
Albumin: 2.8 g/dL — ABNORMAL LOW (ref 3.5–5.0)
Alkaline Phosphatase: 150 U/L — ABNORMAL HIGH (ref 38–126)
Bilirubin, Direct: 0.4 mg/dL — ABNORMAL HIGH (ref 0.0–0.2)
Indirect Bilirubin: 1.1 mg/dL — ABNORMAL HIGH (ref 0.3–0.9)
Total Bilirubin: 1.5 mg/dL — ABNORMAL HIGH (ref 0.3–1.2)
Total Protein: 7.2 g/dL (ref 6.5–8.1)

## 2021-11-23 LAB — BASIC METABOLIC PANEL
Anion gap: 12 (ref 5–15)
Anion gap: 14 (ref 5–15)
BUN: 31 mg/dL — ABNORMAL HIGH (ref 6–20)
BUN: 29 mg/dL — ABNORMAL HIGH (ref 6–20)
CO2: 23 mmol/L (ref 22–32)
CO2: 23 mmol/L (ref 22–32)
Calcium: 9.4 mg/dL (ref 8.9–10.3)
Calcium: 9 mg/dL (ref 8.9–10.3)
Chloride: 89 mmol/L — ABNORMAL LOW (ref 98–111)
Chloride: 91 mmol/L — ABNORMAL LOW (ref 98–111)
Creatinine, Ser: 1.08 mg/dL — ABNORMAL HIGH (ref 0.44–1.00)
Creatinine, Ser: 1.12 mg/dL — ABNORMAL HIGH (ref 0.44–1.00)
GFR, Estimated: 60 mL/min — ABNORMAL LOW (ref >60)
GFR, Estimated: 57 mL/min — ABNORMAL LOW (ref >60)
Glucose, Bld: 316 mg/dL — ABNORMAL HIGH (ref 70–99)
Glucose, Bld: 266 mg/dL — ABNORMAL HIGH (ref 70–99)
Potassium: 4.3 mmol/L (ref 3.5–5.1)
Potassium: 4.3 mmol/L (ref 3.5–5.1)
Sodium: 124 mmol/L — ABNORMAL LOW (ref 135–145)
Sodium: 128 mmol/L — ABNORMAL LOW (ref 135–145)

## 2021-11-23 LAB — URINE CULTURE
MICRO NUMBER:: 13660732
SPECIMEN QUALITY:: ADEQUATE

## 2021-11-23 LAB — CULTURE INDICATED

## 2021-11-23 LAB — CBG MONITORING, ED: Glucose-Capillary: 223 mg/dL — ABNORMAL HIGH (ref 70–99)

## 2021-11-23 MED ORDER — SODIUM CHLORIDE 0.9 % IV SOLN: Freq: Once | INTRAVENOUS | Status: AC

## 2021-11-23 MED ORDER — MECLIZINE HCL 25 MG PO TABS: ORAL_TABLET | ORAL | 0 refills | Status: DC

## 2021-11-23 MED ORDER — SODIUM CHLORIDE 0.9 % IV BOLUS
1000.0000 mL | Freq: Once | INTRAVENOUS | Status: AC
  Administered 2021-11-23: 1000 mL via INTRAVENOUS

## 2021-11-23 MED ORDER — SODIUM CHLORIDE 0.9 % IV SOLN
1.0000 g | Freq: Once | INTRAVENOUS | Status: AC
  Administered 2021-11-23: 1 g via INTRAVENOUS
  Filled 2021-11-23: qty 10

## 2021-11-23 MED ORDER — AMOXICILLIN-POT CLAVULANATE 875-125 MG PO TABS: 1.0000 | ORAL_TABLET | Freq: Two times a day (BID) | ORAL | 0 refills | Status: DC

## 2021-11-23 MED ORDER — INSULIN LISPRO (1 UNIT DIAL) 100 UNIT/ML (KWIKPEN): PEN_INJECTOR | SUBCUTANEOUS | 1 refills | Status: DC

## 2021-11-23 NOTE — Assessment & Plan Note (Signed)
Strep tested in office, negative Warm salt water gargles   

## 2021-11-23 NOTE — ED Provider Notes (Signed)
[facility] EMERGENCY DEPARTMENT Provider Note   CSN: [id number] Arrival date & time: 11/23/21  1333     History  Chief Complaint  Patient presents with   Abnormal Lab    Mallory Stewart is a 61 y.o. female.  61 year old female with prior medical history as detailed below presents for evaluation.  Patient was seen by her endocrinologist on Monday.  Patient reports that she follow-up with her PCP today.  Patient reports that she began to feel poorly on Sunday.  She reports dizziness with standing, symptoms of UTI, and increased fatigue.  Patient seen on Monday by endocrinologist and started on Cipro for treatment of UTI.  Additional labs drawn on Monday demonstrated uncorrected sodium of 115 with a glucose of 572.  She followed up with her PCP today.  Patient continues to endorse symptoms of fatigue, dizziness.  PCP recommended a ER evaluation and possible admission for persistent hyperglycemia and hyponatremia.  Patient is on day 4 of Cipro.  Patient's PCP recommended stopping Cipro and initiating amoxicillin for treatment of both UTI and possible sinusitis.  Past medical history significant for HTN, T2DM , chronic pancreatitis and gastric outlet obstruction, and alcoholic cirrhosis  The history is provided by the patient and medical records.       Home Medications Prior to Admission medications   Medication Sig Start Date End Date Taking? Authorizing Provider  ALPRAZolam (XANAX) 0.25 MG tablet TAKE 1 TABLET(0.25 MG) BY MOUTH DAILY AS NEEDED FOR ANXIETY 03/24/21   Bedsole, Amy E, MD  amitriptyline (ELAVIL) 75 MG tablet Take 1 tablet (75 mg total) by mouth at bedtime. 03/24/21   Bedsole, Amy E, MD  amoxicillin-clavulanate (AUGMENTIN) 875-125 MG tablet Take 1 tablet by mouth 2 (two) times daily for 10 days. 11/23/21 12/03/21  Eugenia Pancoast, FNP  aspirin EC 81 MG EC tablet Take 1 tablet (81 mg total) by mouth 2 (two) times daily. Swallow whole. 03/17/21    Jessy Oto, MD  Blood Glucose Monitoring Suppl (ONE TOUCH ULTRA 2) w/Device KIT Use to check blood sugars two times day 08/01/18   Eliezer Lofts E, MD  carvedilol (COREG) 12.5 MG tablet TAKE 1 TABLET(12.5 MG) BY MOUTH TWICE DAILY WITH A MEAL 07/05/21   Gatha Mayer, MD  cetirizine (ZYRTEC) 10 MG tablet Take 10 mg by mouth at bedtime.    [provider]  ciprofloxacin (CIPRO) 500 MG tablet Take 1 tablet (500 mg total) by mouth 2 (two) times daily for 5 days. 11/21/21 11/26/21  Shamleffer, Melanie Crazier, MD  FEROSUL 325 (65 Fe) MG tablet TAKE 1 TABLET(325 MG) BY MOUTH TWICE DAILY WITH A MEAL 07/19/21   Jessy Oto, MD  furosemide (LASIX) 20 MG tablet Take 1 tablet (20 mg total) by mouth daily. 07/31/21   Gatha Mayer, MD  glucose blood (ONETOUCH ULTRA) test strip CHECK BLOOD SUGAR TWICE DAILY AS DIRECTED 10/20/21   Bedsole, Amy E, MD  Insulin Glargine (BASAGLAR KWIKPEN) 100 UNIT/ML Inject 36 Units into the skin daily. 11/20/21   Shamleffer, Melanie Crazier, MD  insulin lispro (HUMALOG KWIKPEN) 100 UNIT/ML KwikPen Max Daily 30 units 11/23/21   Shamleffer, Melanie Crazier, MD  Insulin Pen Needle 32G X 4 MM MISC 1 Device by Does not apply route in the morning, at noon, in the evening, and at bedtime. 11/20/21   Shamleffer, Melanie Crazier, MD  Lancets [facility] ULTRASOFT) lancets Use to check blood sugar two times a day. 08/01/18   [name], MD  meclizine (ANTIVERT) 25 MG tablet Take 1/2 tablet po qhs prn vertigo 11/23/21   Eugenia Pancoast, FNP  Multiple Vitamin (MULTIVITAMIN WITH MINERALS) TABS tablet Take 1 tablet by mouth daily. 07/25/18   Mikhail, Velta Addison, DO  oxyCODONE (OXY IR/ROXICODONE) 5 MG immediate release tablet Take 1-2 tablets (5-10 mg total) by mouth 2 (two) times daily. 06/29/21   Jessy Oto, MD  pantoprazole (PROTONIX) 40 MG tablet TAKE 1 TABLET BY MOUTH TWICE DAILY BEFORE A MEAL 10/19/21   Gatha Mayer, MD      Allergies    Gluten meal and Zocor [simvastatin]     Review of Systems   Review of Systems  All other systems reviewed and are negative.   Physical Exam Updated Vital Signs BP 120/90 (BP Location: Left Arm)   Pulse 80   Temp 98.3 F (36.8 C) (Oral)   Resp 17   SpO2 100%  Physical Exam Vitals and nursing note reviewed.  Constitutional:      General: She is not in acute distress.    Appearance: Normal appearance. She is well-developed.  HENT:     Head: Normocephalic and atraumatic.  Eyes:     Conjunctiva/sclera: Conjunctivae normal.     Pupils: Pupils are equal, round, and reactive to light.  Cardiovascular:     Rate and Rhythm: Normal rate and regular rhythm.     Heart sounds: Normal heart sounds.  Pulmonary:     Effort: Pulmonary effort is normal. No respiratory distress.     Breath sounds: Normal breath sounds.  Abdominal:     General: There is no distension.     Palpations: Abdomen is soft.     Tenderness: There is no abdominal tenderness.  Musculoskeletal:        General: No deformity. Normal range of motion.     Cervical back: Normal range of motion and neck supple.  Skin:    General: Skin is warm and dry.  Neurological:     General: No focal deficit present.     Mental Status: She is alert and oriented to person, place, and time.     ED Results / Procedures / Treatments   Labs (all labs ordered are listed, but only abnormal results are displayed) Labs Reviewed  CBC WITH DIFFERENTIAL/PLATELET - Abnormal; Notable for the following components:      Result Value   WBC 12.3 (*)    HCT 34.9 (*)    Platelets 119 (*)    Neutro Abs 10.5 (*)    Abs Immature Granulocytes 0.14 (*)    All other components within normal limits  BASIC METABOLIC PANEL - Abnormal; Notable for the following components:   Sodium 124 (*)    Chloride 89 (*)    Glucose, Bld 316 (*)    BUN 31 (*)    Creatinine, Ser 1.08 (*)    GFR, Estimated 60 (*)    All other components within normal limits  URINALYSIS, ROUTINE W REFLEX MICROSCOPIC -  Abnormal; Notable for the following components:   APPearance HAZY (*)    Glucose, UA 50 (*)    Leukocytes,Ua SMALL (*)    Bacteria, UA FEW (*)    All other components within normal limits  I-STAT CHEM 8, ED - Abnormal; Notable for the following components:   Sodium 123 (*)    Chloride 89 (*)    BUN 33 (*)    Glucose, Bld 325 (*)    All other components within normal limits  URINE CULTURE  HEPATIC FUNCTION PANEL  CBG MONITORING, ED    EKG EKG Interpretation  Date/Time:  Thursday November 23 2021 14:46:54 EDT Ventricular Rate:  88 PR Interval:  130 QRS Duration: 70 QT Interval:  366 QTC Calculation: 442 R Axis:   71 Text Interpretation: Normal sinus rhythm Low voltage QRS Borderline ECG When compared with ECG of 07-Aug-2021 16:23, PREVIOUS ECG IS PRESENT Confirmed by Dene Gentry [phone]) on 11/23/2021 5:15:26 PM  Radiology No results found.  Procedures Procedures    Medications Ordered in ED Medications  sodium chloride 0.9 % bolus 1,000 mL (has no administration in time range)  cefTRIAXone (ROCEPHIN) 1 g in sodium chloride 0.9 % 100 mL IVPB (has no administration in time range)    ED Course/ Medical Decision Making/ A&P                           Medical Decision Making Amount and/or Complexity of Data Reviewed Labs: ordered.  Risk Prescription drug management.    Medical Screen Complete  This patient presented to the ED with complaint of resolving UTI, hyperglycemia, hyponatremia.  This complaint involves an extensive number of treatment options. The initial differential diagnosis includes, but is not limited to, metabolic abnormality, dehydration, AKI, infection, etc.  This presentation is: Acute, Self-Limited, Previously Undiagnosed, Uncertain Prognosis, Complicated, Systemic Symptoms, and Threat to Life/Bodily Function  Patient presenting with complaint of fatigue and weakness. Patient with recent work-up demonstrating both UTI and also Hyperglycemia  with concurrent hyponatremia.  Patient is without findings of significant stress on exam. Patient's labs are initially concerning for hyperglycemia and noted hyponatremia.  After IV fluids patient feels significantly improved.  Patient's UTI appears to be improving as compared to prior work-up.  Patient offered admission.  She declined same.    She does understand need to significantly increase her fluid intake.  Suspect hyponatremia is driven by patient's hyperglycemia in the setting of a UTI.  Importance of close follow-up is stressed.  Strict return precautions given and understood.     Co morbidities that complicated the patient's evaluation  Hypertension, diabetes, history of cirrhosis   Additional history obtained:  External records from outside sources obtained and reviewed including prior ED visits and prior Inpatient records.    Lab Tests:  I ordered and personally interpreted labs.  The pertinent results include: CBC, CBG, UA, BMP, hepatic function panel  NSR cardiac Monitoring:  The patient was maintained on a cardiac monitor.  I personally viewed and interpreted the cardiac monitor which showed an underlying rhythm of: nsr   Medicines ordered:  I ordered medication including Rocephin, IV fluid for UTI, dehydration Reevaluation of the patient after these medicines showed that the patient: improved  Problem List / ED Course:  Resolving UTI, hyperglycemia, hyponatremia   Reevaluation:  After the interventions noted above, I reevaluated the patient and found that they have: improved   Disposition:  After consideration of the diagnostic results and the patients response to treatment, I feel that the patent would benefit from close outpatient follow-up.          Final Clinical Impression(s) / ED Diagnoses Final diagnoses:  Hyponatremia    Rx / DC Orders ED Discharge Orders     None         Valarie Merino, MD 11/23/21 2204

## 2021-11-23 NOTE — Discharge Instructions (Addendum)
Return for any problem.  Drink plenty of fluids.  Complete course of antibiotics as previously prescribed.  Return for increased weakness, other concern.

## 2021-11-23 NOTE — Progress Notes (Signed)
Established Patient Office Visit  Subjective:  Patient ID: UNKNOWN FLANNIGAN, female    DOB: 05/07/1961  Age: 61 y.o. MRN: [id number]  CC:  Chief Complaint  Patient presents with   Dizziness   Sore Throat   Ear Fullness    When tries to clear ears it feels like ears explode    HPI Mallory Stewart is here today with concerns.   Right sided facial fullness with sinus pressure. With nasal congestion.  Bil ear fullness, if she tries to decompress her ears she feels the ear 'explodes' but doesn't give her relief. Right sided sore throat, nose running. Cough, dry , non productive.   Vertigo last year and fell, broke both of them. She is using walker for safety as well, she does use cane still  Very dizzy even at rest.   Hyponatremia: .Last metabolic panel, ordered by endo who calculated actual sodium at 126. Pt is without headache, without nausea, feeling weakness but states seems to be more vertigo related. Due to the disorientated. Denies confusion, no change in mental status however does state feels very fatigued and extremely weak. Aox3.   Lab Results  Component Value Date   GLUCOSE 572 (HH) 11/20/2021   NA 115 (LL) 11/20/2021   K 5.8 (H) 11/20/2021   CL 84 (L) 11/20/2021   CO2 25 11/20/2021   BUN 30 (H) 11/20/2021   CREATININE 1.26 (H) 11/20/2021   GFRNONAA 38 (L) 08/07/2021   CALCIUM 10.2 11/20/2021   PHOS 2.6 03/14/2021   PROT 7.8 08/07/2021   ALBUMIN 3.9 08/07/2021   BILITOT 0.6 08/07/2021   ALKPHOS 197 (H) 08/07/2021   AST 27 08/07/2021   ALT 16 08/07/2021   ANIONGAP 10 08/07/2021      Urine culture 7/20 resulted today with UTI with ecoli. Has been on ciprofloxacin right now which shows it is sensitive to this.   Past Medical History:  Diagnosis Date   Alcohol-induced chronic pancreatitis (HCC)    Alcoholic cirrhosis ([place])    B12 deficiency    Concussion    DDD (degenerative disc disease), cervical    Diabetes ([place])    DKA, type 2 ([place])    Gastric  outlet obstruction 08/12/2018   Hypertension    Neuropathy    Pancreatic pseudocyst/cyst 05/06/2013   Seasonal allergies     Past Surgical History:  Procedure Laterality Date   ANKLE ARTHROSCOPY Right 12/20/2020   Procedure: RIGHT ANKLE ARTHROSCOPIC DEBRIDEMENT;  Surgeon: Newt Minion, MD;  Location: [place];  Service: Orthopedics;  Laterality: Right;   BIOPSY  01/19/2019   Procedure: BIOPSY;  Surgeon: Rush Landmark Telford Nab., MD;  Location: [place];  Service: Gastroenterology;;   CHOLECYSTECTOMY N/A 07/28/2016   Procedure: LAPAROSCOPIC CHOLECYSTECTOMY WITH INTRAOPERATIVE CHOLANGIOGRAM;  Surgeon: Mickeal Skinner, MD;  Location: [place];  Service: General;  Laterality: N/A;   ESOPHAGOGASTRODUODENOSCOPY (EGD) WITH PROPOFOL N/A 08/13/2018   Procedure: ESOPHAGOGASTRODUODENOSCOPY (EGD) WITH PROPOFOL;  Surgeon: Irving Copas., MD;  Location: [place];  Service: Gastroenterology;  Laterality: N/A;   ESOPHAGOGASTRODUODENOSCOPY (EGD) WITH PROPOFOL N/A 01/19/2019   Procedure: ESOPHAGOGASTRODUODENOSCOPY (EGD) WITH PROPOFOL;  Surgeon: Rush Landmark Telford Nab., MD;  Location: [place];  Service: Gastroenterology;  Laterality: N/A;   IR PARACENTESIS  08/08/2020   IR PARACENTESIS  03/13/2021   LEEP  1990's   ORIF FOOT FRACTURE  09/2008   L 5th metatarsal   REFRACTIVE SURGERY  2000   TIBIA IM NAIL INSERTION Left 03/12/2021   Procedure: INTRAMEDULLARY (  IM) NAIL TIBIAL;  Surgeon: Jessy Oto, MD;  Location: [place];  Service: Orthopedics;  Laterality: Left;   TONSILLECTOMY     UPPER ESOPHAGEAL ENDOSCOPIC ULTRASOUND (EUS) N/A 08/13/2018   Procedure: UPPER ESOPHAGEAL ENDOSCOPIC ULTRASOUND (EUS);  Surgeon: Irving Copas., MD;  Location: [place];  Service: Gastroenterology;  Laterality: N/A;   UPPER ESOPHAGEAL ENDOSCOPIC ULTRASOUND (EUS) N/A 01/19/2019   Procedure: UPPER ESOPHAGEAL ENDOSCOPIC ULTRASOUND (EUS);  Surgeon: Irving Copas., MD;  Location: [place];  Service: Gastroenterology;  Laterality: N/A;    Family History  Problem Relation Age of Onset   Other Mother        tachycardia.Marland KitchenMarland Kitchen?afib   Stroke Father        after hernia suegery   Prostate cancer Father    Other Father        global transient amnesia, unclear source   Atrial fibrillation Sister    Healthy Brother    Healthy Brother    Coronary artery disease Paternal Grandmother    Heart attack Paternal Grandmother 81   Brain cancer Maternal Grandfather        ?   Cancer Paternal Grandfather        ?   Breast cancer Maternal Grandmother     Social History   Socioeconomic History   Marital status: Married    Spouse name: Not on file   Number of children: 1   Years of education: 16   Highest education level: Not on file  Occupational History   Occupation: Mudlogger of business development  Tobacco Use   Smoking status: Never   Smokeless tobacco: Never  Vaping Use   Vaping Use: Never used  Substance and Sexual Activity   Alcohol use: Not Currently    Alcohol/week: 7.0 standard drinks of alcohol    Types: 7 Glasses of wine per week    Comment: wine   Drug use: No   Sexual activity: Not on file  Other Topics Concern   Not on file  Social History Narrative   Patient is married one stepson    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 not traveling   regular exercise , walking 4-5 times a week   Diet fruits and veggies, water   Alcohol at least several glasses of wine a week Stopped 08/2018   Never smoker, no drug use   Right handed   One story home   [place] in the am   Social Determinants of Health   Financial Resource Strain: Not on file  Food Insecurity: Not on file  Transportation Needs: No Transportation Needs (04/25/2021)   PRAPARE - Hydrologist (Medical): No    Lack of Transportation (Non-Medical): No  Physical Activity: Not on file  Stress: Not on  file  Social Connections: Not on file  Intimate Partner Violence: Not on file    Outpatient Medications Prior to Visit  Medication Sig Dispense Refill   ALPRAZolam (XANAX) 0.25 MG tablet TAKE 1 TABLET(0.25 MG) BY MOUTH DAILY AS NEEDED FOR ANXIETY 30 tablet 0   amitriptyline (ELAVIL) 75 MG tablet Take 1 tablet (75 mg total) by mouth at bedtime. 30 tablet 5   aspirin EC 81 MG EC tablet Take 1 tablet (81 mg total) by mouth 2 (two) times daily. Swallow whole. 30 tablet 11   Blood Glucose Monitoring Suppl (ONE TOUCH ULTRA 2) w/Device KIT Use to check blood sugars  two times day 1 each 0   carvedilol (COREG) 12.5 MG tablet TAKE 1 TABLET(12.5 MG) BY MOUTH TWICE DAILY WITH A MEAL 180 tablet 0   cetirizine (ZYRTEC) 10 MG tablet Take 10 mg by mouth at bedtime.     ciprofloxacin (CIPRO) 500 MG tablet Take 1 tablet (500 mg total) by mouth 2 (two) times daily for 5 days. 10 tablet 0   FEROSUL 325 (65 Fe) MG tablet TAKE 1 TABLET(325 MG) BY MOUTH TWICE DAILY WITH A MEAL 60 tablet 3   furosemide (LASIX) 20 MG tablet Take 1 tablet (20 mg total) by mouth daily. 90 tablet 1   glucose blood (ONETOUCH ULTRA) test strip CHECK BLOOD SUGAR TWICE DAILY AS DIRECTED 100 strip 0   insulin aspart (NOVOLOG FLEXPEN) 100 UNIT/ML FlexPen Max daily 30 units 30 mL 3   Insulin Glargine (BASAGLAR KWIKPEN) 100 UNIT/ML Inject 36 Units into the skin daily. 45 mL 3   Insulin Pen Needle 32G X 4 MM MISC 1 Device by Does not apply route in the morning, at noon, in the evening, and at bedtime. 400 each 3   Lancets (ONETOUCH ULTRASOFT) lancets Use to check blood sugar two times a day. 200 each 3   Multiple Vitamin (MULTIVITAMIN WITH MINERALS) TABS tablet Take 1 tablet by mouth daily.     oxyCODONE (OXY IR/ROXICODONE) 5 MG immediate release tablet Take 1-2 tablets (5-10 mg total) by mouth 2 (two) times daily. 10 tablet 0   pantoprazole (PROTONIX) 40 MG tablet TAKE 1 TABLET BY MOUTH TWICE DAILY BEFORE A MEAL 180 tablet 1   No  facility-administered medications prior to visit.    Allergies  Allergen Reactions   Gluten Meal Other (See Comments)    Stomach distress   Zocor [Simvastatin] Other (See Comments)    Elevated lfts?        Objective:    Physical Exam Constitutional:      General: She is not in acute distress.    Appearance: Normal appearance. She is well-developed and normal weight. She is not ill-appearing, toxic-appearing or diaphoretic.  HENT:     Right Ear: A middle ear effusion (clear to yellow fluid base of eardrum) is present. There is no impacted cerumen. No foreign body. Tympanic membrane is not erythematous or bulging.     Left Ear: Tympanic membrane normal.  No middle ear effusion. Tympanic membrane is not erythematous or bulging.     Nose: Mucosal edema and congestion present. No rhinorrhea.     Right Turbinates: Not enlarged or swollen.     Left Turbinates: Not enlarged or swollen.     Right Sinus: Maxillary sinus tenderness and frontal sinus tenderness present.     Left Sinus: No maxillary sinus tenderness or frontal sinus tenderness.     Mouth/Throat:     Mouth: Mucous membranes are moist.     Pharynx: No pharyngeal swelling, oropharyngeal exudate or posterior oropharyngeal erythema.     Tonsils: No tonsillar exudate.  Eyes:     Extraocular Movements: Extraocular movements intact.     Conjunctiva/sclera: Conjunctivae normal.     Pupils: Pupils are equal, round, and reactive to light.  Neck:     Thyroid: No thyroid mass.  Cardiovascular:     Rate and Rhythm: Normal rate and regular rhythm.  Pulmonary:     Effort: Pulmonary effort is normal.     Breath sounds: Normal breath sounds.  Abdominal:     General: Abdomen is flat.  Musculoskeletal:  Comments: Bil le weakness upon standing   Lymphadenopathy:     Cervical:     Right cervical: No superficial cervical adenopathy.    Left cervical: No superficial cervical adenopathy.  Neurological:     General: No focal deficit  present.     Mental Status: She is alert and oriented to person, place, and time.     Cranial Nerves: No facial asymmetry.     Motor: Weakness (bil le) present.     Coordination: Romberg sign negative. Coordination normal.     Gait: Gait abnormal (unsteady with weakness and vertigo).  Psychiatric:        Mood and Affect: Mood normal.        Behavior: Behavior normal.        Thought Content: Thought content normal.        Judgment: Judgment normal.     BP 100/70   Pulse 85   Temp 98.6 F (37 C)   Resp 16   Ht _0  (1.753 m)   Wt 139 lb (63 kg)   SpO2 98%   BMI 20.53 kg/m  Wt Readings from Last 3 Encounters:  11/23/21 139 lb (63 kg)  11/20/21 139 lb (63 kg)  10/25/21 157 lb (71.2 kg)     Health Maintenance Due  Topic Date Due   COLON CANCER SCREENING ANNUAL FOBT  Never done   COLONOSCOPY (Pts 45-18yr Insurance coverage will need to be confirmed)  Never done   MAMMOGRAM  01/11/2020   COVID-19 Vaccine (4 - Pfizer series) 07/25/2020   URINE MICROALBUMIN  08/18/2020   PAP SMEAR-Modifier  09/21/2020    There are no preventive care reminders to display for this patient.  Lab Results  Component Value Date   TSH 3.323 08/07/2021   Lab Results  Component Value Date   WBC 5.5 08/07/2021   HGB 11.0 (L) 08/07/2021   HCT 31.2 (L) 08/07/2021   MCV 86.0 08/07/2021   PLT 149 (L) 08/07/2021   Lab Results  Component Value Date   NA 115 (LL) 11/20/2021   K 5.8 (H) 11/20/2021   CO2 25 11/20/2021   GLUCOSE 572 (HH) 11/20/2021   BUN 30 (H) 11/20/2021   CREATININE 1.26 (H) 11/20/2021   BILITOT 0.6 08/07/2021   ALKPHOS 197 (H) 08/07/2021   AST 27 08/07/2021   ALT 16 08/07/2021   PROT 7.8 08/07/2021   ALBUMIN 3.9 08/07/2021   CALCIUM 10.2 11/20/2021   ANIONGAP 10 08/07/2021   GFR 46.95 (L) 11/20/2021   Lab Results  Component Value Date   HGBA1C 12.1 (A) 11/20/2021      Assessment & Plan:   Problem List Items Addressed This Visit       Respiratory   Acute  non-recurrent frontal sinusitis    rx augmentin Stop cipro for UTI as also sensitive to augmentin and will treat sinusitis      Relevant Medications   amoxicillin-clavulanate (AUGMENTIN) 875-125 MG tablet     Nervous and Auditory   Benign positional vertigo, right    rx meclizine 12.5 mg, advised may cause fatigue so use with caution.  Rise slowly upon standing.        Relevant Medications   meclizine (ANTIVERT) 25 MG tablet     Other   Dizziness    covid test in office negative.  Have decided to send pt to ER as she is very low on sodium, has had hyponatremia in past, and with dizziness, weakness, fatigue, hyponatremia can be very  serious if not life threatening.       Relevant Orders   POC COVID-19 BinaxNow   Sore throat - Primary    Strep tested in office, negative. Warm salt water gargles         Relevant Medications   amoxicillin-clavulanate (AUGMENTIN) 875-125 MG tablet   Other Relevant Orders   POCT rapid strep A   Weakness of both lower extremities   Hyponatremia    Correct sodium per endo is 126 however after long d/w pt have decided with le weakness, dizziness, and increasing worsening fatigue advised pt to go to ER. She is headed to [facility]. Left with husband who will transport.        Meds ordered this encounter  Medications   amoxicillin-clavulanate (AUGMENTIN) 875-125 MG tablet    Sig: Take 1 tablet by mouth 2 (two) times daily for 10 days.    Dispense:  20 tablet    Refill:  0    Order Specific Question:   Supervising Provider    Answer:   BEDSOLE, AMY E [[id number]]   meclizine (ANTIVERT) 25 MG tablet    Sig: Take 1/2 tablet po qhs prn vertigo    Dispense:  20 tablet    Refill:  0    Order Specific Question:   Supervising Provider    Answer:   BEDSOLE, AMY E [[id number]]    Follow-up: No follow-ups on file.    Eugenia Pancoast, FNP

## 2021-11-23 NOTE — Assessment & Plan Note (Signed)
covid test in office negative.  Have decided to send pt to ER as she is very low on sodium, has had hyponatremia in past, and with dizziness, weakness, fatigue, hyponatremia can be very serious if not life threatening.

## 2021-11-23 NOTE — Assessment & Plan Note (Signed)
rx augmentin Stop cipro for UTI as also sensitive to augmentin and will treat sinusitis

## 2021-11-23 NOTE — ED Provider Triage Note (Signed)
Emergency Medicine Provider Triage Evaluation Note  Mallory Stewart , a 61 y.o. female  was evaluated in triage.  Pt sent from PCP office for evaluation of hyperglycemia and hyponatremia.  Patient's recent sodium level of 115 but in the setting of glucose of 572 her corrected sodium is 126.  Patient is a type II diabetic and is currently only on long-acting insulin.  She states short acting insulin was prescribed Monday but due to insurance issues has not been able to get this filled.  Reports vertigo symptoms, fatigue, and just overall not feeling well.  Denies abdominal pain, nausea, vomiting, chest pain, shortness of breath.  Review of Systems  Positive: As above Negative: As above  Physical Exam  BP 119/85 (BP Location: Right Arm)   Pulse 90   Temp 98.4 F (36.9 C) (Oral)   Resp 18   SpO2 99%  Gen:   Awake, no distress   Resp:  Normal effort  MSK:   Moves extremities without difficulty  Other:    Medical Decision Making  Medically screening exam initiated at 2:42 PM.  Appropriate orders placed.  Mallory Stewart was informed that the remainder of the evaluation will be completed by another provider, this initial triage assessment does not replace that evaluation, and the importance of remaining in the ED until their evaluation is complete.     Marita Kansas, PA-C 11/23/21 1444

## 2021-11-23 NOTE — Assessment & Plan Note (Signed)
Correct sodium per endo is 126 however after long d/w pt have decided with le weakness, dizziness, and increasing worsening fatigue advised pt to go to ER. She is headed to [facility]. Left with husband who will transport.

## 2021-11-23 NOTE — Addendum Note (Signed)
Addended by: Scarlette Shorts on: 11/23/2021 03:01 PM   Modules accepted: Orders

## 2021-11-23 NOTE — Patient Instructions (Signed)
Stop ciprofloxacin for uti  Start augmentin twice daily instead as this will also help with UTI and sinus infection.   Due to recent changes in healthcare laws, you may see results of your imaging and/or laboratory studies on MyChart before I have had a chance to review them.  I understand that in some cases there may be results that are confusing or concerning to you. Please understand that not all results are received at the same time and often I may need to interpret multiple results in order to provide you with the best plan of care or course of treatment. Therefore, I ask that you please give me 2 business days to thoroughly review all your results before contacting my office for clarification. Should we see a critical lab result, you will be contacted sooner.   It was a pleasure seeing you today! Please do not hesitate to reach out with any questions and or concerns.  Regards,   Mort Sawyers FNP-C

## 2021-11-23 NOTE — Assessment & Plan Note (Signed)
rx meclizine 12.5 mg, advised may cause fatigue so use with caution.  Rise slowly upon standing.

## 2021-11-23 NOTE — ED Triage Notes (Addendum)
Pt states she had some blood work from her MD office and was told her sodium levels are really low, and blood sugar is very high. Pt has not missed any of her insulin doses. Pt states she had gone to her MD due to feeling like she had a uti. She does have a UTI, ear/sinus infection and vertigo. Pt has been feeling dizzy for 3-4 days. With hx of vertigo.

## 2021-11-24 LAB — POC COVID19 BINAXNOW: SARS Coronavirus 2 Ag: NEGATIVE

## 2021-11-24 LAB — POCT RAPID STREP A (OFFICE): Rapid Strep A Screen: NEGATIVE

## 2021-11-25 LAB — URINE CULTURE: Culture: <10000 — AB

## 2021-11-27 ENCOUNTER — Telehealth: Payer: Self-pay | Admitting: Family Medicine

## 2021-11-27 NOTE — Telephone Encounter (Signed)
Pt notified as instructed and pt voiced understanding. Pt said now she is feeling much better at home right now and will keep appt with Hayden Pedro FNP on 11/28/21. Sending note to T Dugal FNP and Tresa Endo CMA.

## 2021-11-27 NOTE — Telephone Encounter (Signed)
Patient called in stating she was seen last Thursday and was told she was dehydrated. Medication was changed and every since she has been voiding every 30 minutes and she stated she has diarrhea (watery). She also has a cough that has been keeping her up at night. Sent over to triage.

## 2021-11-27 NOTE — Telephone Encounter (Addendum)
[name] admin said she had pt on phone and was speaking with someone without good connection. I spoke with pt;Pt last seen on 11/23/21 and then went to [facility] ED for IV fluids; pt said things were so crazy at ED pt was offered admission or could go home and pt chose to go home; pt denied ED advised admission and pt declined per [facility] ED note on 11/23/21.Marland Kitchen pt continues with dry cough that makes it hard to sleep at night; pt has not take OTC cough med due to labs with liver and kidney function. Pt is afraid of dehydration; pt said she is drinking a lot of water and gatorade but in last 24 hrs conservatively would say has had at least 12 watery stools and then pt said in last 24 hours she may have had watery stools every hour. Pt is not taking med to stop diarrhea.Pt said has slight dry mouth, slight dizziness, fatigue but not resting well at night and no abd pain and no N&V. Pt said her BP has been good. Pt said started taking oral abx on 11/24/21. Pt said she does not feel great but does not feel bad either. Pt does not want to go back to ED to eval for dehydration. I spoke with Dr Patsy Lager and he said OK to schedule pt in office appt (no available appts today and pt did schedule appt with T Dugal on 11/29/11 at 7:40. Pt will be at office 15 mins early to get cked in at front desk. UC & ED precautions given and pt voiced understanding and pt will continue to drink lots of water to help with cough and dehydration. Sending note to Dr Patsy Lager and Hayden Pedro FNP  and Tresa Endo CMA. Will teams [place] also.    [facility] Day - Client TELEPHONE ADVICE RECORD [facility] Patient Name: Mallory Stewart MCNULT Y Gender: Female DOB: 07-01-1960 Age: 61 Y 10 M 7 D Return Phone Number: [phone] (Primary), [phone] (Secondary) Address: City/ State/ Zip: [place] [address] Client [facility] Day - Client Client Site  [facility] - Day Provider  Kerby Nora - MD Contact Type Call Who Is Calling Patient / Member / Family / Caregiver Call Type Triage / Clinical Relationship To Patient Self Return Phone Number [phone] (Primary) Chief Complaint Diarrhea Reason for Call Symptomatic / Request for Health Information Initial Comment Caller states she has excessive diarrhea for 3 days. Translation No Disp. Time Lamount Cohen Time) Disposition Final User 11/27/2021 10:48:50 AM Attempt made - message left Conner, RN, Gean Maidens 11/27/2021 10:49:29 AM Attempt made - line busy Conner, RN, Lesa 11/27/2021 11:11:52 AM Attempt made - line busy Conner, RN, Lesa 11/27/2021 11:17:58 AM Send To RN Personal Conner, RN, Lesa 11/27/2021 11:54:17 AM FINAL ATTEMPT MADE - message left Yes Berna Bue RN, Megan Final Disposition 11/27/2021 11:54:17 AM FINAL ATTEMPT MADE - message left Yes

## 2021-11-27 NOTE — Telephone Encounter (Signed)
Noted  

## 2021-11-27 NOTE — Telephone Encounter (Signed)
Thank you for your recommendation  If any increasing fatigue, dizziness, weakness go to ER or fluids and or quicker evaluations  Might be antibiotics as she is on Augmentin but will also need to r/o CDIFF Pt to be seen as scheduled tomorrow in office. Thank you.

## 2021-11-28 ENCOUNTER — Ambulatory Visit (INDEPENDENT_AMBULATORY_CARE_PROVIDER_SITE_OTHER): Payer: 59 | Admitting: Family

## 2021-11-28 ENCOUNTER — Encounter: Payer: Self-pay | Admitting: Family

## 2021-11-28 ENCOUNTER — Other Ambulatory Visit: Payer: Self-pay | Admitting: Family

## 2021-11-28 VITALS — BP 102/69 | HR 83 | Temp 98.6°F | Resp 16 | Ht 69.0 in | Wt 141.2 lb

## 2021-11-28 DIAGNOSIS — J0110 Acute frontal sinusitis, unspecified: Secondary | ICD-10-CM

## 2021-11-28 DIAGNOSIS — D649 Anemia, unspecified: Secondary | ICD-10-CM

## 2021-11-28 DIAGNOSIS — R0602 Shortness of breath: Secondary | ICD-10-CM | POA: Diagnosis not present

## 2021-11-28 DIAGNOSIS — R197 Diarrhea, unspecified: Secondary | ICD-10-CM | POA: Diagnosis not present

## 2021-11-28 DIAGNOSIS — E871 Hypo-osmolality and hyponatremia: Secondary | ICD-10-CM | POA: Diagnosis not present

## 2021-11-28 LAB — BASIC METABOLIC PANEL
BUN: 11 mg/dL (ref 6–23)
CO2: 25 mEq/L (ref 19–32)
Calcium: 9.5 mg/dL (ref 8.4–10.5)
Chloride: 93 mEq/L — ABNORMAL LOW (ref 96–112)
Creatinine, Ser: 0.82 mg/dL (ref 0.40–1.20)
GFR: 78.61 mL/min (ref >60.00)
Glucose, Bld: 248 mg/dL — ABNORMAL HIGH (ref 70–99)
Potassium: 4.2 mEq/L (ref 3.5–5.1)
Sodium: 127 mEq/L — ABNORMAL LOW (ref 135–145)

## 2021-11-28 LAB — CBC
HCT: 33.2 % — ABNORMAL LOW (ref 36.0–46.0)
Hemoglobin: 11.1 g/dL — ABNORMAL LOW (ref 12.0–15.0)
MCHC: 33.3 g/dL (ref 30.0–36.0)
MCV: 88.3 fl (ref 78.0–100.0)
Platelets: 205 10*3/uL (ref 150.0–400.0)
RBC: 3.76 Mil/uL — ABNORMAL LOW (ref 3.87–5.11)
RDW: 13.8 % (ref 11.5–15.5)
WBC: 15 10*3/uL — ABNORMAL HIGH (ref 4.0–10.5)

## 2021-11-28 LAB — BRAIN NATRIURETIC PEPTIDE: Pro B Natriuretic peptide (BNP): 69 pg/mL (ref 0.0–100.0)

## 2021-11-28 MED ORDER — ALBUTEROL SULFATE HFA 108 (90 BASE) MCG/ACT IN AERS: 2.0000 | INHALATION_SPRAY | Freq: Four times a day (QID) | RESPIRATORY_TRACT | 0 refills | Status: DC | PRN

## 2021-11-28 MED ORDER — MONTELUKAST SODIUM 10 MG PO TABS: 10.0000 mg | ORAL_TABLET | Freq: Every day | ORAL | 3 refills | Status: DC

## 2021-11-28 NOTE — Progress Notes (Signed)
Established Patient Office Visit  Subjective:  Patient ID: Mallory Stewart, female    DOB: 09/15/1960  Age: 61 y.o. MRN: [id number]  CC:  Chief Complaint  Patient presents with   Diarrhea    X 3 days Has got better not every 15 min   Breathing Problem    HPI Mallory Stewart is here today with concerns.   Acute frontal sinusitis: started on augmentin 7/20 and had watery stool right after. She has been having watery stools since, two nights ago with about 12 times a day of stool and last night did not have to get up to go urgently at all. It seems to be dissipating. Did have bowel movement this am and was still a bit watery. Yesterday probably had about 8 episodes of stool. Prior to starting augmentin she had had about four days of ciprofloxacin. The sinus pressure is getting better.   She is having trouble breathing because she can't seem to position her body where she isn't coughing. The cough is not productive, she feels like she is breathing through water. She has some bil ear fullness.   She does report she feels much better than over the last two days  No fever no chills.  No n/v   She did go to ER 7/20 for hyponatremia as well, sent by me, and was given sodium ch and rocephin 1 g in sodium chl  Sodium did replace from 123 to 128 after bag of fluids   Is not taking a probioitic  Past Medical History:  Diagnosis Date   Alcohol-induced chronic pancreatitis ([place])    Alcoholic cirrhosis ([place])    B12 deficiency    Concussion    DDD (degenerative disc disease), cervical    Diabetes ([place])    DKA, type 2 ([place])    Gastric outlet obstruction 08/12/2018   Hypertension    Neuropathy    Pancreatic pseudocyst/cyst 05/06/2013   Seasonal allergies     Past Surgical History:  Procedure Laterality Date   ANKLE ARTHROSCOPY Right 12/20/2020   Procedure: RIGHT ANKLE ARTHROSCOPIC DEBRIDEMENT;  Surgeon: Newt Minion, MD;  Location: [place];  Service:  Orthopedics;  Laterality: Right;   BIOPSY  01/19/2019   Procedure: BIOPSY;  Surgeon: Rush Landmark Telford Nab., MD;  Location: [place];  Service: Gastroenterology;;   CHOLECYSTECTOMY N/A 07/28/2016   Procedure: LAPAROSCOPIC CHOLECYSTECTOMY WITH INTRAOPERATIVE CHOLANGIOGRAM;  Surgeon: Mickeal Skinner, MD;  Location: [place];  Service: General;  Laterality: N/A;   ESOPHAGOGASTRODUODENOSCOPY (EGD) WITH PROPOFOL N/A 08/13/2018   Procedure: ESOPHAGOGASTRODUODENOSCOPY (EGD) WITH PROPOFOL;  Surgeon: Irving Copas., MD;  Location: [place];  Service: Gastroenterology;  Laterality: N/A;   ESOPHAGOGASTRODUODENOSCOPY (EGD) WITH PROPOFOL N/A 01/19/2019   Procedure: ESOPHAGOGASTRODUODENOSCOPY (EGD) WITH PROPOFOL;  Surgeon: Rush Landmark Telford Nab., MD;  Location: [place];  Service: Gastroenterology;  Laterality: N/A;   IR PARACENTESIS  08/08/2020   IR PARACENTESIS  03/13/2021   LEEP  1990's   ORIF FOOT FRACTURE  09/2008   L 5th metatarsal   REFRACTIVE SURGERY  2000   TIBIA IM NAIL INSERTION Left 03/12/2021   Procedure: INTRAMEDULLARY (IM) NAIL TIBIAL;  Surgeon: Jessy Oto, MD;  Location: [place];  Service: Orthopedics;  Laterality: Left;   TONSILLECTOMY     UPPER ESOPHAGEAL ENDOSCOPIC ULTRASOUND (EUS) N/A 08/13/2018   Procedure: UPPER ESOPHAGEAL ENDOSCOPIC ULTRASOUND (EUS);  Surgeon: Irving Copas., MD;  Location: [place];  Service: Gastroenterology;  Laterality: N/A;   UPPER ESOPHAGEAL ENDOSCOPIC  ULTRASOUND (EUS) N/A 01/19/2019   Procedure: UPPER ESOPHAGEAL ENDOSCOPIC ULTRASOUND (EUS);  Surgeon: Irving Copas., MD;  Location: [place];  Service: Gastroenterology;  Laterality: N/A;    Family History  Problem Relation Age of Onset   Other Mother        tachycardia.Marland KitchenMarland Kitchen?afib   Stroke Father        after hernia suegery   Prostate cancer Father    Other Father        global transient amnesia, unclear source   Atrial fibrillation Sister    Healthy Brother     Healthy Brother    Coronary artery disease Paternal Grandmother    Heart attack Paternal Grandmother 78   Brain cancer Maternal Grandfather        ?   Cancer Paternal Grandfather        ?   Breast cancer Maternal Grandmother     Social History   Socioeconomic History   Marital status: Married    Spouse name: Not on file   Number of children: 1   Years of education: 16   Highest education level: Not on file  Occupational History   Occupation: Mudlogger of business development  Tobacco Use   Smoking status: Never   Smokeless tobacco: Never  Vaping Use   Vaping Use: Never used  Substance and Sexual Activity   Alcohol use: Not Currently    Alcohol/week: 7.0 standard drinks of alcohol    Types: 7 Glasses of wine per week    Comment: wine   Drug use: No   Sexual activity: Not on file  Other Topics Concern   Not on file  Social History Narrative   Patient is married one stepson    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 not traveling   regular exercise , walking 4-5 times a week   Diet fruits and veggies, water   Alcohol at least several glasses of wine a week Stopped 08/2018   Never smoker, no drug use   Right handed   One story home   [place]offe in the am   Social Determinants of Health   Financial Resource Strain: Not on file  Food Insecurity: Not on file  Transportation Needs: No Transportation Needs (04/25/2021)   PRAPARE - Hydrologist (Medical): No    Lack of Transportation (Non-Medical): No  Physical Activity: Not on file  Stress: Not on file  Social Connections: Not on file  Intimate Partner Violence: Not on file    Outpatient Medications Prior to Visit  Medication Sig Dispense Refill   ALPRAZolam (XANAX) 0.25 MG tablet TAKE 1 TABLET(0.25 MG) BY MOUTH DAILY AS NEEDED FOR ANXIETY 30 tablet 0   amitriptyline (ELAVIL) 75 MG tablet Take 1 tablet (75 mg total) by mouth at  bedtime. 30 tablet 5   aspirin EC 81 MG EC tablet Take 1 tablet (81 mg total) by mouth 2 (two) times daily. Swallow whole. 30 tablet 11   Blood Glucose Monitoring Suppl (ONE TOUCH ULTRA 2) w/Device KIT Use to check blood sugars two times day 1 each 0   carvedilol (COREG) 12.5 MG tablet TAKE 1 TABLET(12.5 MG) BY MOUTH TWICE DAILY WITH A MEAL 180 tablet 0   cetirizine (ZYRTEC) 10 MG tablet Take 10 mg by mouth at bedtime.     FEROSUL 325 (65 Fe) MG tablet TAKE 1 TABLET(325 MG) BY MOUTH TWICE DAILY WITH A MEAL  60 tablet 3   furosemide (LASIX) 20 MG tablet Take 1 tablet (20 mg total) by mouth daily. 90 tablet 1   glucose blood (ONETOUCH ULTRA) test strip CHECK BLOOD SUGAR TWICE DAILY AS DIRECTED 100 strip 0   Insulin Glargine (BASAGLAR KWIKPEN) 100 UNIT/ML Inject 36 Units into the skin daily. 45 mL 3   insulin lispro (HUMALOG KWIKPEN) 100 UNIT/ML KwikPen Max Daily 30 units 30 mL 1   Insulin Pen Needle 32G X 4 MM MISC 1 Device by Does not apply route in the morning, at noon, in the evening, and at bedtime. 400 each 3   Lancets (ONETOUCH ULTRASOFT) lancets Use to check blood sugar two times a day. 200 each 3   meclizine (ANTIVERT) 25 MG tablet Take 1/2 tablet po qhs prn vertigo 20 tablet 0   Multiple Vitamin (MULTIVITAMIN WITH MINERALS) TABS tablet Take 1 tablet by mouth daily.     oxyCODONE (OXY IR/ROXICODONE) 5 MG immediate release tablet Take 1-2 tablets (5-10 mg total) by mouth 2 (two) times daily. 10 tablet 0   pantoprazole (PROTONIX) 40 MG tablet TAKE 1 TABLET BY MOUTH TWICE DAILY BEFORE A MEAL 180 tablet 1   amoxicillin-clavulanate (AUGMENTIN) 875-125 MG tablet Take 1 tablet by mouth 2 (two) times daily for 10 days. 20 tablet 0   No facility-administered medications prior to visit.    Allergies  Allergen Reactions   Augmentin [Amoxicillin-Pot Clavulanate] Dermatitis   Gluten Meal Other (See Comments)    Stomach distress   Zocor [Simvastatin] Other (See Comments)    Elevated lfts?         Objective:    Physical Exam Constitutional:      General: She is not in acute distress.    Appearance: Normal appearance. She is normal weight. She is not ill-appearing, toxic-appearing or diaphoretic.  HENT:     Right Ear: Tympanic membrane normal.     Left Ear: Tympanic membrane normal.     Nose: Nose normal. No congestion or rhinorrhea.     Right Turbinates: Not enlarged or swollen.     Left Turbinates: Not enlarged or swollen.     Right Sinus: No maxillary sinus tenderness or frontal sinus tenderness.     Left Sinus: No maxillary sinus tenderness or frontal sinus tenderness.     Mouth/Throat:     Mouth: Mucous membranes are moist.     Pharynx: No pharyngeal swelling, oropharyngeal exudate or posterior oropharyngeal erythema.     Tonsils: No tonsillar exudate.  Eyes:     Extraocular Movements: Extraocular movements intact.     Conjunctiva/sclera: Conjunctivae normal.     Pupils: Pupils are equal, round, and reactive to light.  Neck:     Thyroid: No thyroid mass.  Cardiovascular:     Rate and Rhythm: Normal rate and regular rhythm.  Pulmonary:     Effort: Pulmonary effort is normal.     Breath sounds: Normal breath sounds.  Abdominal:     General: Abdomen is flat. Bowel sounds are normal. There is no distension.     Palpations: There is no mass.     Tenderness: There is no abdominal tenderness. There is no guarding or rebound.     Hernia: No hernia is present.  Lymphadenopathy:     Cervical:     Right cervical: No superficial cervical adenopathy.    Left cervical: No superficial cervical adenopathy.  Neurological:     Mental Status: She is alert.     BP 102/69  Pulse 83   Temp 98.6 F (37 C)   Resp 16   Ht $R'5\' 9"'sW$  (1.753 m)   Wt 141 lb 4 oz (64.1 kg)   SpO2 99%   BMI 20.86 kg/m  Wt Readings from Last 3 Encounters:  11/28/21 141 lb 4 oz (64.1 kg)  11/23/21 139 lb (63 kg)  11/20/21 139 lb (63 kg)     Health Maintenance Due  Topic Date Due    COLON CANCER SCREENING ANNUAL FOBT  Never done   COLONOSCOPY (Pts 45-53yrs Insurance coverage will need to be confirmed)  Never done   MAMMOGRAM  01/11/2020   COVID-19 Vaccine (4 - Pfizer series) 07/25/2020   URINE MICROALBUMIN  08/18/2020   PAP SMEAR-Modifier  09/21/2020    There are no preventive care reminders to display for this patient.  Lab Results  Component Value Date   TSH 3.323 08/07/2021   Lab Results  Component Value Date   WBC 12.3 (H) 11/23/2021   HGB 12.9 11/23/2021   HCT 38.0 11/23/2021   MCV 83.7 11/23/2021   PLT 119 (L) 11/23/2021   Lab Results  Component Value Date   NA 128 (L) 11/23/2021   K 4.3 11/23/2021   CO2 23 11/23/2021   GLUCOSE 266 (H) 11/23/2021   BUN 29 (H) 11/23/2021   CREATININE 1.12 (H) 11/23/2021   BILITOT 1.5 (H) 11/23/2021   ALKPHOS 150 (H) 11/23/2021   AST 21 11/23/2021   ALT 15 11/23/2021   PROT 7.2 11/23/2021   ALBUMIN 2.8 (L) 11/23/2021   CALCIUM 9.0 11/23/2021   ANIONGAP 14 11/23/2021   GFR 46.95 (L) 11/20/2021   Lab Results  Component Value Date   HGBA1C 12.1 (A) 11/20/2021      Assessment & Plan:   Problem List Items Addressed This Visit       Respiratory   Acute non-recurrent frontal sinusitis    Stop augmentin  I feel as though we are at a point with symptom resolution and I feel as though augmentin is exacerbating diarrhea R/o infectious process with diarrhea, and then we can consider starting immodium          Digestive   Diarrhea of presumed infectious origin - Primary    Stool cultures today Repeat bmp and check cbc for possible wbc elevation  Pending results  immodium if negative infectious process D/c augmentin to see if resolution       Relevant Orders   Basic metabolic panel   CBC   Gastrointestinal Pathogen Pnl RT, PCR   Giardia antigen   C. difficile GDH and Toxin A/B     Other   Hyponatremia   Relevant Orders   Basic metabolic panel   CBC   Shortness of breath    Ordering  bnp Consider cxr if no improvement although lung sounds clear today on auscultation Start singulair 10 mg once daily  rx albuterol HFA 108 mcg prn , instructed to wash mouth with water after inhaler use       Relevant Medications   montelukast (SINGULAIR) 10 MG tablet   albuterol (VENTOLIN HFA) 108 (90 Base) MCG/ACT inhaler   Other Relevant Orders   Brain natriuretic peptide    Meds ordered this encounter  Medications   montelukast (SINGULAIR) 10 MG tablet    Sig: Take 1 tablet (10 mg total) by mouth at bedtime.    Dispense:  30 tablet    Refill:  3    Order Specific Question:   Supervising Provider  Answer:   BEDSOLE, AMY E [[id number]]   albuterol (VENTOLIN HFA) 108 (90 Base) MCG/ACT inhaler    Sig: Inhale 2 puffs into the lungs every 6 (six) hours as needed for wheezing or shortness of breath.    Dispense:  8 g    Refill:  0    Order Specific Question:   Supervising Provider    Answer:   Diona Browner, AMY E [[id number]]    Follow-up: Return in about 1 week (around 12/05/2021) for Dr. Diona Browner for follow up .    Eugenia Pancoast, FNP

## 2021-11-28 NOTE — Telephone Encounter (Signed)
PT was this this am in the office.

## 2021-11-28 NOTE — Progress Notes (Signed)
Sodium still improved at 127.  Glucose still high but pt to continue f/u with endo.  White blood cell is increasing pending results of stools  Slightly new anemia, order fobt. Have pt complete. Put in order Bnp not elevated.  Pending results of stool cultures as well in case cdiff?

## 2021-11-28 NOTE — Patient Instructions (Addendum)
Recommend over the counter probiotic, florajen and or culturelle.   Recommend mucinex over the counter, regular mucinex.   Due to recent changes in healthcare laws, you may see results of your imaging and/or laboratory studies on MyChart before I have had a chance to review them.  I understand that in some cases there may be results that are confusing or concerning to you. Please understand that not all results are received at the same time and often I may need to interpret multiple results in order to provide you with the best plan of care or course of treatment. Therefore, I ask that you please give me 2 business days to thoroughly review all your results before contacting my office for clarification. Should we see a critical lab result, you will be contacted sooner.   It was a pleasure seeing you today! Please do not hesitate to reach out with any questions and or concerns.  Regards,   Mort Sawyers FNP-C

## 2021-11-28 NOTE — Assessment & Plan Note (Signed)
Stool cultures today Repeat bmp and check cbc for possible wbc elevation  Pending results  immodium if negative infectious process D/c augmentin to see if resolution

## 2021-11-28 NOTE — Assessment & Plan Note (Signed)
Stop augmentin  I feel as though we are at a point with symptom resolution and I feel as though augmentin is exacerbating diarrhea R/o infectious process with diarrhea, and then we can consider starting immodium

## 2021-11-28 NOTE — Assessment & Plan Note (Signed)
Ordering bnp Consider cxr if no improvement although lung sounds clear today on auscultation Start singulair 10 mg once daily  rx albuterol HFA 108 mcg prn , instructed to wash mouth with water after inhaler use

## 2021-12-01 ENCOUNTER — Telehealth: Payer: Self-pay

## 2021-12-01 ENCOUNTER — Other Ambulatory Visit: Payer: Self-pay | Admitting: Family

## 2021-12-01 DIAGNOSIS — A0472 Enterocolitis due to Clostridium difficile, not specified as recurrent: Secondary | ICD-10-CM

## 2021-12-01 LAB — C. DIFFICILE GDH AND TOXIN A/B
GDH ANTIGEN: DETECTED
MICRO NUMBER:: 13697283
SPECIMEN QUALITY:: ADEQUATE
TOXIN A AND B: NOT DETECTED

## 2021-12-01 LAB — CLOSTRIDIUM DIFFICILE TOXIN B, QUALITATIVE, REAL-TIME PCR: Toxigenic C. Difficile by PCR: DETECTED — AB

## 2021-12-01 MED ORDER — FIDAXOMICIN 200 MG PO TABS: 200.0000 mg | ORAL_TABLET | Freq: Two times a day (BID) | ORAL | 0 refills | Status: DC

## 2021-12-01 NOTE — Telephone Encounter (Signed)
Left message to return call to our office.  

## 2021-12-01 NOTE — Telephone Encounter (Signed)
Patient call and was returning a call from [place].

## 2021-12-01 NOTE — Progress Notes (Signed)
So far negative for GI pathogens  CDIFF 1 of 2 tests positive Pending results of another test to confirm or deny  How is stool? Still with diarrhea? How many times a day?

## 2021-12-01 NOTE — Telephone Encounter (Signed)
It is hard to say if you are positive or negative as of yet pending the PCR  Even if you did have it the biggest risk is for those that are immunocompromised and or young children.  Be cognizant about lysol after every toilet use as well as if able having your own room.  If you would like given that its Friday and I have not yet gotten the finalized results I am happy to send treatment for cdiff just to be extra cautious in case it does come back positive Monday you have at least been treated for it.   If interested is there a nearby pharmacy where she is staying?

## 2021-12-01 NOTE — Telephone Encounter (Signed)
Please advise pt that she is positive for CDIFF I have sent in RX twice daily x 10 days

## 2021-12-01 NOTE — Telephone Encounter (Signed)
Please send  RX to [facility] [address] [place], [place], [place] [address]. Pt is willing to start medication.

## 2021-12-04 ENCOUNTER — Encounter: Payer: Self-pay | Admitting: Family

## 2021-12-04 LAB — GASTROINTESTINAL PATHOGEN PNL
CampyloBacter Group: NOT DETECTED
Norovirus GI/GII: NOT DETECTED
Rotavirus A: NOT DETECTED
Salmonella species: NOT DETECTED
Shiga Toxin 1: NOT DETECTED
Shiga Toxin 2: NOT DETECTED
Shigella Species: NOT DETECTED
Vibrio Group: NOT DETECTED
Yersinia enterocolitica: NOT DETECTED

## 2021-12-04 LAB — GIARDIA ANTIGEN
MICRO NUMBER:: 13697278
RESULT:: NOT DETECTED
SPECIMEN QUALITY:: ADEQUATE

## 2021-12-04 MED ORDER — FIDAXOMICIN 200 MG PO TABS: 200.0000 mg | ORAL_TABLET | Freq: Two times a day (BID) | ORAL | 0 refills | Status: DC

## 2021-12-04 NOTE — Addendum Note (Signed)
Addended by: Mort Sawyers on: 12/04/2021 02:18 PM   Modules accepted: Orders

## 2021-12-04 NOTE — Telephone Encounter (Signed)
Left message to return call to our office.  

## 2021-12-04 NOTE — Telephone Encounter (Signed)
Spoke to pt and informed her that she was +. She stated she could not get her medication because insurance would not cover it in [place] and would cost $5000.00. Wanted to know if we could send it to [facility] and will pick up when she get back from vacation.

## 2021-12-05 MED ORDER — FIDAXOMICIN 200 MG PO TABS: 200.0000 mg | ORAL_TABLET | Freq: Two times a day (BID) | ORAL | 0 refills | Status: AC

## 2021-12-05 NOTE — Telephone Encounter (Addendum)
Patient called back and said it was sent to the wrong pharmacy and that it needed to got to [facility] on [address], [place], [place] [address]. She said shes in hospital with mom so if you call her just to leave a detailed message so she doesn't have to call back

## 2021-12-05 NOTE — Addendum Note (Signed)
Addended by: Mort Sawyers on: 12/05/2021 12:52 PM   Modules accepted: Orders

## 2021-12-06 NOTE — Telephone Encounter (Signed)
Pt informed Rx was sent to [facility]

## 2021-12-07 ENCOUNTER — Other Ambulatory Visit: Payer: Self-pay | Admitting: Internal Medicine

## 2021-12-07 ENCOUNTER — Other Ambulatory Visit: Payer: Self-pay | Admitting: Specialist

## 2021-12-07 DIAGNOSIS — I10 Essential (primary) hypertension: Secondary | ICD-10-CM

## 2021-12-12 ENCOUNTER — Other Ambulatory Visit: Payer: Self-pay | Admitting: Family Medicine

## 2021-12-12 DIAGNOSIS — E119 Type 2 diabetes mellitus without complications: Secondary | ICD-10-CM

## 2021-12-13 ENCOUNTER — Ambulatory Visit (INDEPENDENT_AMBULATORY_CARE_PROVIDER_SITE_OTHER): Payer: 59 | Admitting: Internal Medicine

## 2021-12-13 ENCOUNTER — Telehealth: Payer: Self-pay | Admitting: Family Medicine

## 2021-12-13 ENCOUNTER — Other Ambulatory Visit (INDEPENDENT_AMBULATORY_CARE_PROVIDER_SITE_OTHER): Payer: 59

## 2021-12-13 ENCOUNTER — Encounter: Payer: Self-pay | Admitting: Internal Medicine

## 2021-12-13 VITALS — BP 90/58 | HR 85 | Ht 69.0 in | Wt 143.0 lb

## 2021-12-13 DIAGNOSIS — K7031 Alcoholic cirrhosis of liver with ascites: Secondary | ICD-10-CM

## 2021-12-13 DIAGNOSIS — R161 Splenomegaly, not elsewhere classified: Secondary | ICD-10-CM

## 2021-12-13 DIAGNOSIS — K838 Other specified diseases of biliary tract: Secondary | ICD-10-CM

## 2021-12-13 DIAGNOSIS — K863 Pseudocyst of pancreas: Secondary | ICD-10-CM

## 2021-12-13 DIAGNOSIS — A498 Other bacterial infections of unspecified site: Secondary | ICD-10-CM

## 2021-12-13 DIAGNOSIS — E871 Hypo-osmolality and hyponatremia: Secondary | ICD-10-CM

## 2021-12-13 DIAGNOSIS — K860 Alcohol-induced chronic pancreatitis: Secondary | ICD-10-CM

## 2021-12-13 DIAGNOSIS — L723 Sebaceous cyst: Secondary | ICD-10-CM

## 2021-12-13 LAB — CBC WITH DIFFERENTIAL/PLATELET
Basophils Absolute: 0 10*3/uL (ref 0.0–0.1)
Basophils Relative: 0.5 % (ref 0.0–3.0)
Eosinophils Absolute: 0 10*3/uL (ref 0.0–0.7)
Eosinophils Relative: 0.6 % (ref 0.0–5.0)
HCT: 29.6 % — ABNORMAL LOW (ref 36.0–46.0)
Hemoglobin: 9.7 g/dL — ABNORMAL LOW (ref 12.0–15.0)
Lymphocytes Relative: 11.3 % — ABNORMAL LOW (ref 12.0–46.0)
Lymphs Abs: 0.9 10*3/uL (ref 0.7–4.0)
MCHC: 32.7 g/dL (ref 30.0–36.0)
MCV: 89.4 fl (ref 78.0–100.0)
Monocytes Absolute: 0.7 10*3/uL (ref 0.1–1.0)
Monocytes Relative: 9.2 % (ref 3.0–12.0)
Neutro Abs: 6.3 10*3/uL (ref 1.4–7.7)
Neutrophils Relative %: 78.4 % — ABNORMAL HIGH (ref 43.0–77.0)
Platelets: 217 10*3/uL (ref 150.0–400.0)
RBC: 3.31 Mil/uL — ABNORMAL LOW (ref 3.87–5.11)
RDW: 14.4 % (ref 11.5–15.5)
WBC: 8.1 10*3/uL (ref 4.0–10.5)

## 2021-12-13 LAB — COMPREHENSIVE METABOLIC PANEL
ALT: 6 U/L (ref 0–35)
AST: 13 U/L (ref 0–37)
Albumin: 3.3 g/dL — ABNORMAL LOW (ref 3.5–5.2)
Alkaline Phosphatase: 155 U/L — ABNORMAL HIGH (ref 39–117)
BUN: 16 mg/dL (ref 6–23)
CO2: 27 mEq/L (ref 19–32)
Calcium: 9.2 mg/dL (ref 8.4–10.5)
Chloride: 95 mEq/L — ABNORMAL LOW (ref 96–112)
Creatinine, Ser: 1.1 mg/dL (ref 0.40–1.20)
GFR: 55.24 mL/min — ABNORMAL LOW (ref >60.00)
Glucose, Bld: 287 mg/dL — ABNORMAL HIGH (ref 70–99)
Potassium: 4.9 mEq/L (ref 3.5–5.1)
Sodium: 129 mEq/L — ABNORMAL LOW (ref 135–145)
Total Bilirubin: 1.7 mg/dL — ABNORMAL HIGH (ref 0.2–1.2)
Total Protein: 7.4 g/dL (ref 6.0–8.3)

## 2021-12-13 LAB — PROTIME-INR
INR: 1.5 ratio — ABNORMAL HIGH (ref 0.8–1.0)
Prothrombin Time: 15.8 s — ABNORMAL HIGH (ref 9.6–13.1)

## 2021-12-13 NOTE — Telephone Encounter (Signed)
Please schedule CPE with fasting labs prior with Dr. Bedsole.  

## 2021-12-13 NOTE — Telephone Encounter (Signed)
Patient scheduled.

## 2021-12-13 NOTE — Telephone Encounter (Signed)
Patient called in asking could she have a referral sent to dermatology for the cyst that she has on her neck? She has seen Dr Ermalene Searing about this issue before.

## 2021-12-13 NOTE — Progress Notes (Signed)
Mallory Stewart 61 y.o. 05-Jun-1960 [id number]  Assessment & Plan:   Encounter Diagnoses  Name Primary?   Alcoholic cirrhosis of liver with ascites (HCC) Yes   Chronic alcoholic pancreatitis ([place])    Hyponatremia    Clostridioides difficile infection    Splenomegaly    Dilated bile duct    Pancreatic pseudocyst     Fortunately she feels well but she has a complicated medical situation.  Regarding the dilated bile duct, I suspect it is probably related to chronic pancreatitis.  Malignancy not excluded.  I am going to have her do a complete abdominal ultrasound and checking a CA 19-9 as well as other labs (see below).  She may need an EUS and possibly an ERCP depending upon what we find out.  Given current blood pressure and that she is off carvedilol at this point I am stopping that for the time being.  She was on that for portal hypertension/GI bleeding prophylaxis from her isolated gastric varix.  That may be related to her pancreatitis problems anyway.  I am referring her to the [place] [facility] in [facility] for an assessment regarding her liver disease.  Given her comorbidities think liver transplant seems unlikely but I suppose we should consider if she is a candidate.  I calculated a MELD score in clinic today based upon old labs and her MELD 3.0 was 20.  Based upon labs below her MELD 3.0 is also 20 using the sodium of 129 though she has pseudohyponatremia to a degree so her corrected sodium is 132 or 133 which gives a MELD 3.0 of 18.  MRI in June said moderate to large ascites, that is not really detectable on her physical exam.  She certainly could have some but she is not significantly distended.  Given her glucose levels I suspect she needs a higher dose of insulin.  I have encouraged her to be on a high-protein diet given her liver disease issues and trying to prevent any loss of muscle mass. Lab Results  Component Value Date   WBC 8.1 12/13/2021   HGB 9.7 (L)  12/13/2021   HCT 29.6 (L) 12/13/2021   MCV 89.4 12/13/2021   PLT 217.0 12/13/2021   Lab Results  Component Value Date   CREATININE 1.10 12/13/2021   BUN 16 12/13/2021   NA 129 (L) 12/13/2021   K 4.9 12/13/2021   CL 95 (L) 12/13/2021   CO2 27 12/13/2021  Glucose 287 Lab Results  Component Value Date   ALT 6 12/13/2021   AST 13 12/13/2021   ALKPHOS 155 (H) 12/13/2021   BILITOT 1.7 (H) 12/13/2021   Total protein 7.4 Albumin 3.3 GFR 55   Lab Results  Component Value Date   INR 1.5 (H) 12/13/2021   INR 1.3 (H) 08/07/2021   INR 1.3 (H) 08/07/2021    CA 19-9 and alpha-fetoprotein are pending     Subjective:   Chief Complaint: Follow-up of cirrhosis and pancreatitis  HPI Mallory Stewart is here for follow-up, I had last seen her regarding her chronic pancreatitis and cirrhosis with ascites in April, and ordered an MRI at that time.  I reduced her furosemide to 20 mg every other day previously due to hyponatremia issues.  Spironolactone and losartan had been stopped.  She had had some confusion issues and had gone to neurology and that was a negative evaluation, the notes indicated she was drinking wine weekly but she says that is not true and was not true.  EEG  was unrevealing, (mildly slow) and an MRI of the brain is pending.  Confusion episodes I think are better.  There have been slightly elevated ammonia levels but it was 23 in April.  Hepatic encephalopathy has not been an issue that we are aware of.  Other events, and there have been several, include transitioning to insulin for severe hyperglycemia.  Shortly after getting the new insulin prescription from Dr. Sherre Scarlet in July on the 17th, she then ended up in the emergency department with a glucose of 572 and significant pseudohyponatremia at 115.  She had respiratory symptoms and felt unwell negative COVID tests, she was treated for urinary tract infection though culture was negative, she says she was on 3 different antibiotics  and then she developed severe watery diarrhea and had a positive C. difficile PCR after she had an antigen positive toxin negative study.  She was treated with Dificid and is about to finish that and has no diarrhea at this time.   She says she feels "awesome".  Not having any pain.  She is wondering why her blood pressure is so low.  She ran out of carvedilol and stopped taking it as there were no refills and she did not call for any.  She has not been lightheaded or presyncopal.  She took a cruise to [place] in [place] and had a great time and "did not touch a drop of alcohol".  She says she has been abstinent for months now.  She does not miss that she tells me.  10/16/2021 MRI abdomen/MRCP as below: IMPRESSION: 6.7 cm pseudocyst adjacent to the pancreatic tail and spleen. No radiographic signs of acute pancreatitis.   No significant change in diffuse biliary ductal dilatation, with smoothly tapered stricture of the distal common bile duct, likely due to chronic pancreatitis. No evidence of choledocholithiasis.   Hepatic cirrhosis, and signs of portal venous hypertension. No evidence of hepatic neoplasm.   Moderate to large amount of ascites.   Wt Readings from Last 3 Encounters:  12/13/21 143 lb (64.9 kg)  11/28/21 141 lb 4 oz (64.1 kg)  11/23/21 139 lb (63 kg)     Started insulin July 17 Allergies  Allergen Reactions   Augmentin [Amoxicillin-Pot Clavulanate] Dermatitis   Gluten Meal Other (See Comments)    Stomach distress   Zocor [Simvastatin] Other (See Comments)    Elevated lfts?   Current Meds  Medication Sig   amitriptyline (ELAVIL) 75 MG tablet Take 1 tablet (75 mg total) by mouth at bedtime.   aspirin EC 81 MG EC tablet Take 1 tablet (81 mg total) by mouth 2 (two) times daily. Swallow whole.   Blood Glucose Monitoring Suppl (ONE TOUCH ULTRA 2) w/Device KIT Use to check blood sugars two times day   carvedilol (COREG) 12.5 MG tablet TAKE 1 TABLET(12.5 MG) BY MOUTH  TWICE DAILY WITH A MEAL   cetirizine (ZYRTEC) 10 MG tablet Take 10 mg by mouth at bedtime.   FEROSUL 325 (65 Fe) MG tablet TAKE 1 TABLET(325 MG) BY MOUTH TWICE DAILY WITH A MEAL   fidaxomicin (DIFICID) 200 MG TABS tablet Take 1 tablet (200 mg total) by mouth 2 (two) times daily for 10 days.   furosemide (LASIX) 20 MG tablet Take 1 tablet (20 mg total) by mouth daily.   glucose blood (ONETOUCH ULTRA) test strip CHECK BLOOD SUGAR TWICE DAILY AS DIRECTED   Insulin Glargine (BASAGLAR KWIKPEN) 100 UNIT/ML Inject 36 Units into the skin daily.   insulin lispro (HUMALOG  KWIKPEN) 100 UNIT/ML KwikPen Max Daily 30 units   Insulin Pen Needle 32G X 4 MM MISC 1 Device by Does not apply route in the morning, at noon, in the evening, and at bedtime.   Lancets (ONETOUCH ULTRASOFT) lancets Use to check blood sugar two times a day.   meclizine (ANTIVERT) 25 MG tablet Take 1/2 tablet po qhs prn vertigo   Multiple Vitamin (MULTIVITAMIN WITH MINERALS) TABS tablet Take 1 tablet by mouth daily.   pantoprazole (PROTONIX) 40 MG tablet TAKE 1 TABLET BY MOUTH TWICE DAILY BEFORE A MEAL   Past Medical History:  Diagnosis Date   Alcohol-induced chronic pancreatitis (HCC)    Alcoholic cirrhosis (HCC)    B12 deficiency    Concussion    DDD (degenerative disc disease), cervical    Diabetes ([place])    DKA, type 2 ([place])    Gastric outlet obstruction 08/12/2018   Hypertension    Neuropathy    Pancreatic pseudocyst/cyst 05/06/2013   Seasonal allergies    Past Surgical History:  Procedure Laterality Date   ANKLE ARTHROSCOPY Right 12/20/2020   Procedure: RIGHT ANKLE ARTHROSCOPIC DEBRIDEMENT;  Surgeon: Newt Minion, MD;  Location: [place];  Service: Orthopedics;  Laterality: Right;   BIOPSY  01/19/2019   Procedure: BIOPSY;  Surgeon: Rush Landmark Telford Nab., MD;  Location: [place];  Service: Gastroenterology;;   CHOLECYSTECTOMY N/A 07/28/2016   Procedure: LAPAROSCOPIC CHOLECYSTECTOMY WITH  INTRAOPERATIVE CHOLANGIOGRAM;  Surgeon: Mickeal Skinner, MD;  Location: [place];  Service: General;  Laterality: N/A;   ESOPHAGOGASTRODUODENOSCOPY (EGD) WITH PROPOFOL N/A 08/13/2018   Procedure: ESOPHAGOGASTRODUODENOSCOPY (EGD) WITH PROPOFOL;  Surgeon: Irving Copas., MD;  Location: [place];  Service: Gastroenterology;  Laterality: N/A;   ESOPHAGOGASTRODUODENOSCOPY (EGD) WITH PROPOFOL N/A 01/19/2019   Procedure: ESOPHAGOGASTRODUODENOSCOPY (EGD) WITH PROPOFOL;  Surgeon: Rush Landmark Telford Nab., MD;  Location: [place];  Service: Gastroenterology;  Laterality: N/A;   IR PARACENTESIS  08/08/2020   IR PARACENTESIS  03/13/2021   LEEP  1990's   ORIF FOOT FRACTURE  09/2008   L 5th metatarsal   REFRACTIVE SURGERY  2000   TIBIA IM NAIL INSERTION Left 03/12/2021   Procedure: INTRAMEDULLARY (IM) NAIL TIBIAL;  Surgeon: Jessy Oto, MD;  Location: [place];  Service: Orthopedics;  Laterality: Left;   TONSILLECTOMY     UPPER ESOPHAGEAL ENDOSCOPIC ULTRASOUND (EUS) N/A 08/13/2018   Procedure: UPPER ESOPHAGEAL ENDOSCOPIC ULTRASOUND (EUS);  Surgeon: Irving Copas., MD;  Location: [place];  Service: Gastroenterology;  Laterality: N/A;   UPPER ESOPHAGEAL ENDOSCOPIC ULTRASOUND (EUS) N/A 01/19/2019   Procedure: UPPER ESOPHAGEAL ENDOSCOPIC ULTRASOUND (EUS);  Surgeon: Irving Copas., MD;  Location: [place];  Service: Gastroenterology;  Laterality: N/A;   Social History   Social History Narrative   Patient is married one stepson    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 not traveling   regular exercise , walking 4-5 times a week   Diet fruits and veggies, water   Alcohol at least several glasses of wine a week Stopped 08/2018   Never smoker, no drug use   Right handed   One story home   [place] in the am   family history includes Atrial fibrillation in her sister; Brain cancer in her maternal grandfather;  Breast cancer in her maternal grandmother; Cancer in her paternal grandfather; Coronary artery disease in her paternal grandmother; Healthy in her brother and brother; Heart attack (age of onset: 80)  in her paternal grandmother; Other in her father and mother; Prostate cancer in her father; Stroke in her father.   Review of Systems See HPI  Objective:   Physical Exam BP (!) 90/58   Pulse 85   Ht 5' 9" (1.753 m)   Wt 143 lb (64.9 kg)   BMI 21.12 kg/m  NAD anicteric Lungs cta Cor NL Abd soft NT no HSM/mass BS + - no clear ascites Ext no edema Alert and oriented x 3  50 minutes total time spent on this visit both in preparation, during the in person portion and documentation phase.

## 2021-12-13 NOTE — Patient Instructions (Signed)
Your provider has requested that you go to the basement level for lab work before leaving today. Press "B" on the elevator. The lab is located at the first door on the left as you exit the elevator.  Due to recent changes in healthcare laws, you may see the results of your imaging and laboratory studies on MyChart before your provider has had a chance to review them.  We understand that in some cases there may be results that are confusing or concerning to you. Not all laboratory results come back in the same time frame and the provider may be waiting for multiple results in order to interpret others.  Please give [place] 48 hours in order for your provider to thoroughly review all the results before contacting the office for clarification of your results.   You will be contacted by [facility] Scheduling in the next 2 days to arrange a complete abdominal U/S.  The number on your caller ID will be [phone], please answer when they call.  If you have not heard from them in 2 days please call [phone] to schedule.     Stop your carvedilol.  We will refer you to liver care to see Annamarie Major NP. Their phone # is : [phone]. They will contact you with an appointment.  I appreciate the opportunity to care for you. Stan Head, MD, [facility]

## 2021-12-13 NOTE — Telephone Encounter (Signed)
I would be happy to send in a referral to dermatology. [facility] or [facility].  Triage please call to make sure there is no acute issue... does she need ASAp appt for I and D of infected cyst... followed later by derm visit for removal.  In  other words... is the cyst red, hot, draining, increasing is size rapidly, painful? If so, make appt for I and D with me  or available ASAP  If just present, slowly increasing in size, cosmetic concern... I will just place referral to Dermatology

## 2021-12-14 ENCOUNTER — Other Ambulatory Visit (INDEPENDENT_AMBULATORY_CARE_PROVIDER_SITE_OTHER): Payer: 59

## 2021-12-14 DIAGNOSIS — E538 Deficiency of other specified B group vitamins: Secondary | ICD-10-CM

## 2021-12-14 LAB — VITAMIN B12: Vitamin B-12: 1141 pg/mL — ABNORMAL HIGH (ref 211–911)

## 2021-12-14 NOTE — Telephone Encounter (Signed)
Notified to patient that dermatology referral was placed.  If redness and increased warmth began over the area she should be seen here sooner.

## 2021-12-14 NOTE — Telephone Encounter (Signed)
Mallory Stewart notified as instructed by telephone.  Patient states understanding.  

## 2021-12-14 NOTE — Addendum Note (Signed)
Addended byKerby Nora E on: 12/14/2021 02:02 PM   Modules accepted: Orders

## 2021-12-14 NOTE — Telephone Encounter (Signed)
I spoke with pt; pt said she has already seen Dr Ermalene Searing for the cyst on neck and pt said the cyst on lt side of neck started out the size of a pea, then the size of a marble and over the last few months has increased in size to 1 1/2" long and 1" wide. No redness,warmth, or drainage but pt has persistent pain ranging from zero (which is pts pain level now) to a 6. Pt has noticed in the last month an odor coming from cyst even though there is no drainage.offered pt an appt at [facility] but declined and pt request dermatology referral to dermatologist in [place].pt will cb prior to referral if any change in cyst. Sending note to Dr Ermalene Searing.

## 2021-12-15 LAB — CANCER ANTIGEN 19-9: CA 19-9: 15 U/mL (ref <34)

## 2021-12-15 LAB — AFP TUMOR MARKER: AFP-Tumor Marker: 2.2 ng/mL

## 2021-12-18 ENCOUNTER — Ambulatory Visit (HOSPITAL_COMMUNITY)
Admission: RE | Admit: 2021-12-18 | Discharge: 2021-12-18 | Disposition: A | Discharge status: Home / Self Care | Payer: 59 | Source: Ambulatory Visit | Attending: Internal Medicine | Admitting: Internal Medicine

## 2021-12-18 DIAGNOSIS — K863 Pseudocyst of pancreas: Secondary | ICD-10-CM | POA: Diagnosis present

## 2021-12-18 DIAGNOSIS — R161 Splenomegaly, not elsewhere classified: Secondary | ICD-10-CM | POA: Diagnosis present

## 2021-12-18 DIAGNOSIS — K7031 Alcoholic cirrhosis of liver with ascites: Secondary | ICD-10-CM | POA: Insufficient documentation

## 2021-12-18 DIAGNOSIS — K860 Alcohol-induced chronic pancreatitis: Secondary | ICD-10-CM | POA: Insufficient documentation

## 2021-12-18 DIAGNOSIS — K838 Other specified diseases of biliary tract: Secondary | ICD-10-CM | POA: Diagnosis present

## 2021-12-19 ENCOUNTER — Encounter: Payer: Self-pay | Admitting: Internal Medicine

## 2021-12-21 ENCOUNTER — Telehealth: Payer: Self-pay | Admitting: Family Medicine

## 2021-12-21 NOTE — Telephone Encounter (Signed)
Patient called and stated that she hasn't heard anything about a referral for Dermatology. Call back number [phone].

## 2021-12-24 ENCOUNTER — Encounter (HOSPITAL_COMMUNITY): Payer: Self-pay

## 2021-12-24 ENCOUNTER — Emergency Department (HOSPITAL_COMMUNITY): Payer: 59

## 2021-12-24 ENCOUNTER — Other Ambulatory Visit: Payer: Self-pay

## 2021-12-24 ENCOUNTER — Inpatient Hospital Stay (HOSPITAL_COMMUNITY)
Admission: EM | Admit: 2021-12-24 | Discharge: 2022-01-02 | DRG: 439 | Disposition: A | Discharge status: Home / Self Care | Payer: 59 | Attending: Internal Medicine | Admitting: Internal Medicine

## 2021-12-24 DIAGNOSIS — D72829 Elevated white blood cell count, unspecified: Secondary | ICD-10-CM | POA: Diagnosis not present

## 2021-12-24 DIAGNOSIS — D638 Anemia in other chronic diseases classified elsewhere: Secondary | ICD-10-CM | POA: Diagnosis present

## 2021-12-24 DIAGNOSIS — R109 Unspecified abdominal pain: Secondary | ICD-10-CM

## 2021-12-24 DIAGNOSIS — E1165 Type 2 diabetes mellitus with hyperglycemia: Secondary | ICD-10-CM | POA: Diagnosis present

## 2021-12-24 DIAGNOSIS — F419 Anxiety disorder, unspecified: Secondary | ICD-10-CM | POA: Diagnosis not present

## 2021-12-24 DIAGNOSIS — I152 Hypertension secondary to endocrine disorders: Secondary | ICD-10-CM | POA: Diagnosis present

## 2021-12-24 DIAGNOSIS — K863 Pseudocyst of pancreas: Principal | ICD-10-CM | POA: Diagnosis present

## 2021-12-24 DIAGNOSIS — K860 Alcohol-induced chronic pancreatitis: Secondary | ICD-10-CM | POA: Diagnosis present

## 2021-12-24 DIAGNOSIS — E1122 Type 2 diabetes mellitus with diabetic chronic kidney disease: Secondary | ICD-10-CM | POA: Diagnosis present

## 2021-12-24 DIAGNOSIS — I8510 Secondary esophageal varices without bleeding: Secondary | ICD-10-CM | POA: Diagnosis present

## 2021-12-24 DIAGNOSIS — K3189 Other diseases of stomach and duodenum: Secondary | ICD-10-CM | POA: Diagnosis present

## 2021-12-24 DIAGNOSIS — J302 Other seasonal allergic rhinitis: Secondary | ICD-10-CM | POA: Diagnosis present

## 2021-12-24 DIAGNOSIS — K766 Portal hypertension: Secondary | ICD-10-CM | POA: Diagnosis present

## 2021-12-24 DIAGNOSIS — K862 Cyst of pancreas: Secondary | ICD-10-CM | POA: Diagnosis not present

## 2021-12-24 DIAGNOSIS — Z91018 Allergy to other foods: Secondary | ICD-10-CM

## 2021-12-24 DIAGNOSIS — Z881 Allergy status to other antibiotic agents status: Secondary | ICD-10-CM

## 2021-12-24 DIAGNOSIS — E785 Hyperlipidemia, unspecified: Secondary | ICD-10-CM | POA: Diagnosis present

## 2021-12-24 DIAGNOSIS — E871 Hypo-osmolality and hyponatremia: Secondary | ICD-10-CM | POA: Diagnosis present

## 2021-12-24 DIAGNOSIS — R188 Other ascites: Secondary | ICD-10-CM | POA: Diagnosis not present

## 2021-12-24 DIAGNOSIS — Z79899 Other long term (current) drug therapy: Secondary | ICD-10-CM

## 2021-12-24 DIAGNOSIS — Q8909 Congenital malformations of spleen: Secondary | ICD-10-CM | POA: Diagnosis not present

## 2021-12-24 DIAGNOSIS — K219 Gastro-esophageal reflux disease without esophagitis: Secondary | ICD-10-CM | POA: Diagnosis present

## 2021-12-24 DIAGNOSIS — K838 Other specified diseases of biliary tract: Secondary | ICD-10-CM | POA: Diagnosis present

## 2021-12-24 DIAGNOSIS — K7031 Alcoholic cirrhosis of liver with ascites: Secondary | ICD-10-CM | POA: Diagnosis present

## 2021-12-24 DIAGNOSIS — E1159 Type 2 diabetes mellitus with other circulatory complications: Secondary | ICD-10-CM | POA: Diagnosis present

## 2021-12-24 DIAGNOSIS — N1831 Chronic kidney disease, stage 3a: Secondary | ICD-10-CM | POA: Diagnosis present

## 2021-12-24 DIAGNOSIS — E1169 Type 2 diabetes mellitus with other specified complication: Secondary | ICD-10-CM | POA: Diagnosis present

## 2021-12-24 DIAGNOSIS — I864 Gastric varices: Secondary | ICD-10-CM | POA: Diagnosis present

## 2021-12-24 DIAGNOSIS — N179 Acute kidney failure, unspecified: Secondary | ICD-10-CM | POA: Diagnosis present

## 2021-12-24 DIAGNOSIS — Z7982 Long term (current) use of aspirin: Secondary | ICD-10-CM

## 2021-12-24 DIAGNOSIS — B961 Klebsiella pneumoniae [K. pneumoniae] as the cause of diseases classified elsewhere: Secondary | ICD-10-CM | POA: Diagnosis not present

## 2021-12-24 DIAGNOSIS — Z794 Long term (current) use of insulin: Secondary | ICD-10-CM | POA: Diagnosis not present

## 2021-12-24 DIAGNOSIS — E1140 Type 2 diabetes mellitus with diabetic neuropathy, unspecified: Secondary | ICD-10-CM | POA: Diagnosis present

## 2021-12-24 DIAGNOSIS — I10 Essential (primary) hypertension: Secondary | ICD-10-CM | POA: Diagnosis present

## 2021-12-24 DIAGNOSIS — R6510 Systemic inflammatory response syndrome (SIRS) of non-infectious origin without acute organ dysfunction: Secondary | ICD-10-CM

## 2021-12-24 DIAGNOSIS — Z87820 Personal history of traumatic brain injury: Secondary | ICD-10-CM

## 2021-12-24 DIAGNOSIS — Z9049 Acquired absence of other specified parts of digestive tract: Secondary | ICD-10-CM

## 2021-12-24 DIAGNOSIS — I129 Hypertensive chronic kidney disease with stage 1 through stage 4 chronic kidney disease, or unspecified chronic kidney disease: Secondary | ICD-10-CM | POA: Diagnosis present

## 2021-12-24 DIAGNOSIS — Z888 Allergy status to other drugs, medicaments and biological substances status: Secondary | ICD-10-CM

## 2021-12-24 LAB — COMPREHENSIVE METABOLIC PANEL
ALT: 9 U/L (ref 0–44)
AST: 19 U/L (ref 15–41)
Albumin: 2.9 g/dL — ABNORMAL LOW (ref 3.5–5.0)
Alkaline Phosphatase: 139 U/L — ABNORMAL HIGH (ref 38–126)
Anion gap: 9 (ref 5–15)
BUN: 14 mg/dL (ref 6–20)
CO2: 26 mmol/L (ref 22–32)
Calcium: 8.8 mg/dL — ABNORMAL LOW (ref 8.9–10.3)
Chloride: 95 mmol/L — ABNORMAL LOW (ref 98–111)
Creatinine, Ser: 1.09 mg/dL — ABNORMAL HIGH (ref 0.44–1.00)
GFR, Estimated: 59 mL/min — ABNORMAL LOW (ref >60)
Glucose, Bld: 175 mg/dL — ABNORMAL HIGH (ref 70–99)
Potassium: 4.4 mmol/L (ref 3.5–5.1)
Sodium: 130 mmol/L — ABNORMAL LOW (ref 135–145)
Total Bilirubin: 1.4 mg/dL — ABNORMAL HIGH (ref 0.3–1.2)
Total Protein: 7 g/dL (ref 6.5–8.1)

## 2021-12-24 LAB — URINALYSIS, ROUTINE W REFLEX MICROSCOPIC
Bilirubin Urine: NEGATIVE
Glucose, UA: NEGATIVE mg/dL
Hgb urine dipstick: NEGATIVE
Ketones, ur: NEGATIVE mg/dL
Leukocytes,Ua: NEGATIVE
Nitrite: NEGATIVE
Protein, ur: NEGATIVE mg/dL
Specific Gravity, Urine: 1.014 (ref 1.005–1.030)
pH: 5 (ref 5.0–8.0)

## 2021-12-24 LAB — CBC WITH DIFFERENTIAL/PLATELET
Abs Immature Granulocytes: 0.17 10*3/uL — ABNORMAL HIGH (ref 0.00–0.07)
Basophils Absolute: 0.1 10*3/uL (ref 0.0–0.1)
Basophils Relative: 0 %
Eosinophils Absolute: 0 10*3/uL (ref 0.0–0.5)
Eosinophils Relative: 0 %
HCT: 29.7 % — ABNORMAL LOW (ref 36.0–46.0)
Hemoglobin: 9.8 g/dL — ABNORMAL LOW (ref 12.0–15.0)
Immature Granulocytes: 1 %
Lymphocytes Relative: 7 %
Lymphs Abs: 1.4 10*3/uL (ref 0.7–4.0)
MCH: 29.5 pg (ref 26.0–34.0)
MCHC: 33 g/dL (ref 30.0–36.0)
MCV: 89.5 fL (ref 80.0–100.0)
Monocytes Absolute: 1 10*3/uL (ref 0.1–1.0)
Monocytes Relative: 5 %
Neutro Abs: 16.1 10*3/uL — ABNORMAL HIGH (ref 1.7–7.7)
Neutrophils Relative %: 87 %
Platelets: 302 10*3/uL (ref 150–400)
RBC: 3.32 MIL/uL — ABNORMAL LOW (ref 3.87–5.11)
RDW: 13.6 % (ref 11.5–15.5)
WBC: 18.8 10*3/uL — ABNORMAL HIGH (ref 4.0–10.5)
nRBC: 0 % (ref 0.0–0.2)

## 2021-12-24 LAB — GLUCOSE, CAPILLARY: Glucose-Capillary: 128 mg/dL — ABNORMAL HIGH (ref 70–99)

## 2021-12-24 LAB — LIPASE, BLOOD: Lipase: 22 U/L (ref 11–51)

## 2021-12-24 LAB — LACTIC ACID, PLASMA: Lactic Acid, Venous: 2.9 mmol/L (ref 0.5–1.9)

## 2021-12-24 MED ORDER — KETOROLAC TROMETHAMINE 15 MG/ML IJ SOLN
30.0000 mg | Freq: Once | INTRAMUSCULAR | Status: AC
  Administered 2021-12-24: 15 mg via INTRAMUSCULAR
  Filled 2021-12-24: qty 2

## 2021-12-24 MED ORDER — INSULIN ASPART 100 UNIT/ML IJ SOLN
0.0000 [IU] | INTRAMUSCULAR | Status: DC
  Administered 2021-12-25: 2 [IU] via SUBCUTANEOUS
  Administered 2021-12-25 (×3): 1 [IU] via SUBCUTANEOUS
  Administered 2021-12-26 (×2): 2 [IU] via SUBCUTANEOUS
  Administered 2021-12-27 (×3): 1 [IU] via SUBCUTANEOUS
  Administered 2021-12-27: 2 [IU] via SUBCUTANEOUS
  Administered 2021-12-27: 3 [IU] via SUBCUTANEOUS
  Administered 2021-12-27: 1 [IU] via SUBCUTANEOUS
  Administered 2021-12-28: 2 [IU] via SUBCUTANEOUS
  Administered 2021-12-28: 3 [IU] via SUBCUTANEOUS
  Administered 2021-12-28: 2 [IU] via SUBCUTANEOUS
  Administered 2021-12-28: 1 [IU] via SUBCUTANEOUS
  Administered 2021-12-28 (×2): 2 [IU] via SUBCUTANEOUS
  Administered 2021-12-29 (×2): 1 [IU] via SUBCUTANEOUS
  Administered 2021-12-29 (×3): 2 [IU] via SUBCUTANEOUS
  Administered 2021-12-29: 1 [IU] via SUBCUTANEOUS
  Administered 2021-12-30 (×2): 2 [IU] via SUBCUTANEOUS
  Administered 2021-12-30: 1 [IU] via SUBCUTANEOUS
  Administered 2021-12-30: 2 [IU] via SUBCUTANEOUS
  Administered 2021-12-30: 3 [IU] via SUBCUTANEOUS
  Administered 2021-12-30: 1 [IU] via SUBCUTANEOUS
  Administered 2021-12-31 (×2): 2 [IU] via SUBCUTANEOUS
  Administered 2021-12-31: 1 [IU] via SUBCUTANEOUS
  Administered 2021-12-31: 2 [IU] via SUBCUTANEOUS
  Administered 2021-12-31: 5 [IU] via SUBCUTANEOUS
  Administered 2021-12-31 (×2): 2 [IU] via SUBCUTANEOUS
  Administered 2022-01-01 (×4): 3 [IU] via SUBCUTANEOUS
  Administered 2022-01-01 – 2022-01-02 (×2): 2 [IU] via SUBCUTANEOUS
  Administered 2022-01-02: 1 [IU] via SUBCUTANEOUS
  Administered 2022-01-02: 3 [IU] via SUBCUTANEOUS
  Administered 2022-01-02: 2 [IU] via SUBCUTANEOUS

## 2021-12-24 MED ORDER — OXYCODONE-ACETAMINOPHEN 5-325 MG PO TABS
1.0000 | ORAL_TABLET | Freq: Once | ORAL | Status: AC
  Administered 2021-12-24: 1 via ORAL
  Filled 2021-12-24: qty 1

## 2021-12-24 MED ORDER — INSULIN ASPART 100 UNIT/ML IJ SOLN: 0.0000 [IU] | Freq: Three times a day (TID) | INTRAMUSCULAR | Status: DC

## 2021-12-24 MED ORDER — INSULIN ASPART 100 UNIT/ML IJ SOLN: 0.0000 [IU] | Freq: Every day | INTRAMUSCULAR | Status: DC

## 2021-12-24 MED ORDER — FENTANYL CITRATE PF 50 MCG/ML IJ SOSY
12.5000 ug | PREFILLED_SYRINGE | INTRAMUSCULAR | Status: DC | PRN
  Administered 2021-12-24 – 2021-12-25 (×4): 12.5 ug via INTRAVENOUS
  Filled 2021-12-24 (×4): qty 1

## 2021-12-24 MED ORDER — IOHEXOL 300 MG/ML  SOLN
100.0000 mL | Freq: Once | INTRAMUSCULAR | Status: AC | PRN
  Administered 2021-12-24: 100 mL via INTRAVENOUS

## 2021-12-24 NOTE — ED Triage Notes (Signed)
Pt states she slipped Monday in her bathroom, pt did not fall but reports left sided back pain and has a lump in that area. Pt states she could have pulled a muscle

## 2021-12-24 NOTE — ED Provider Notes (Signed)
[facility] DEPT Provider Note   CSN: [id number] Arrival date & time: 12/24/21  1226     History PMH: HLD, DM2, HTN, Alcoholic pancreatitis, Cirrhosis Chief Complaint  Patient presents with   Back Pain    MELODI HAPPEL is a 62 y.o. female. Presents with swelling and pain to her left flank.  She has this has been ongoing since Wednesday and has gradually worsened.  She does remember slipping on Monday but she did not fall or have any injury to this area.  She describes 10 of 10 pain that is constant nothing is made it better or worse. No endorse history of enlarged spleen.  She says she recently had abdominal ultrasounds done prior to the symptoms starting. Denies any urinary symptoms, fevers, abdominal pain, nausea, vomiting.   Back Pain Associated symptoms: no abdominal pain, no dysuria, no fever and no pelvic pain        Home Medications Prior to Admission medications   Medication Sig Start Date End Date Taking? Authorizing Provider  amitriptyline (ELAVIL) 75 MG tablet Take 1 tablet (75 mg total) by mouth at bedtime. 03/24/21   [name], MD  aspirin EC 81 MG EC tablet Take 1 tablet (81 mg total) by mouth 2 (two) times daily. Swallow whole. 03/17/21   Jessy Oto, MD  Blood Glucose Monitoring Suppl (ONE TOUCH ULTRA 2) w/Device KIT Use to check blood sugars two times day 08/01/18   [name], MD  cetirizine (ZYRTEC) 10 MG tablet Take 10 mg by mouth at bedtime.    [provider]  FEROSUL 325 (65 Fe) MG tablet TAKE 1 TABLET(325 MG) BY MOUTH TWICE DAILY WITH A MEAL 07/19/21   Jessy Oto, MD  furosemide (LASIX) 20 MG tablet Take 1 tablet (20 mg total) by mouth daily. Patient taking differently: Take 20 mg by mouth every other day. 07/31/21   Gatha Mayer, MD  glucose blood (ONETOUCH ULTRA) test strip CHECK BLOOD SUGAR TWICE DAILY AS DIRECTED 12/13/21   Bedsole, Amy E, MD  Insulin Glargine (BASAGLAR KWIKPEN) 100 UNIT/ML  Inject 36 Units into the skin daily. 11/20/21   Shamleffer, Melanie Crazier, MD  insulin lispro (HUMALOG KWIKPEN) 100 UNIT/ML KwikPen Max Daily 30 units 11/23/21   Shamleffer, Melanie Crazier, MD  Insulin Pen Needle 32G X 4 MM MISC 1 Device by Does not apply route in the morning, at noon, in the evening, and at bedtime. 11/20/21   Shamleffer, Melanie Crazier, MD  Lancets [facility] ULTRASOFT) lancets Use to check blood sugar two times a day. 08/01/18   [name], MD  meclizine (ANTIVERT) 25 MG tablet Take 1/2 tablet po qhs prn vertigo 11/23/21   Eugenia Pancoast, FNP  Multiple Vitamin (MULTIVITAMIN WITH MINERALS) TABS tablet Take 1 tablet by mouth daily. 07/25/18   Mikhail, Velta Addison, DO  pantoprazole (PROTONIX) 40 MG tablet TAKE 1 TABLET BY MOUTH TWICE DAILY BEFORE A MEAL 10/19/21   Gatha Mayer, MD      Allergies    Augmentin [amoxicillin-pot clavulanate], Gluten meal, and Zocor [simvastatin]    Review of Systems   Review of Systems  Constitutional:  Negative for fever.  Gastrointestinal:  Negative for abdominal pain, nausea and vomiting.  Genitourinary:  Positive for flank pain. Negative for dysuria, frequency, hematuria, pelvic pain and vaginal discharge.  Musculoskeletal:  Positive for back pain.  Skin:  Negative for color change, rash and wound.  All other systems reviewed and are negative.  Physical Exam Updated Vital Signs BP 119/74 (BP Location: Right Arm)   Pulse 87   Temp 98.5 F (36.9 C) (Oral)   Resp 17   Ht _0  (1.753 m)   Wt 65.8 kg   SpO2 97%   BMI 21.41 kg/m  Physical Exam Vitals and nursing note reviewed.  Constitutional:      General: She is not in acute distress.    Appearance: Normal appearance. She is well-developed. She is not ill-appearing, toxic-appearing or diaphoretic.  HENT:     Head: Normocephalic and atraumatic.     Nose: No nasal deformity.     Mouth/Throat:     Lips: Pink. No lesions.  Eyes:     General: Gaze aligned appropriately. No  scleral icterus.       Right eye: No discharge.        Left eye: No discharge.     Conjunctiva/sclera: Conjunctivae normal.     Right eye: Right conjunctiva is not injected. No exudate or hemorrhage.    Left eye: Left conjunctiva is not injected. No exudate or hemorrhage. Pulmonary:     Effort: Pulmonary effort is normal. No respiratory distress.  Abdominal:     General: Abdomen is flat. There is no distension.     Palpations: Abdomen is soft. There is no mass.     Tenderness: There is no abdominal tenderness. There is left CVA tenderness. There is no right CVA tenderness, guarding or rebound.     Hernia: No hernia is present.     Comments: Overlying left flank is notable soft tissue swelling of approximately 7x5 cm without any skin discoloration. Extremely tender to the touch.   Skin:    General: Skin is warm and dry.  Neurological:     Mental Status: She is alert and oriented to person, place, and time.  Psychiatric:        Mood and Affect: Mood normal.        Speech: Speech normal.        Behavior: Behavior normal. Behavior is cooperative.     ED Results / Procedures / Treatments   Labs (all labs ordered are listed, but only abnormal results are displayed) Labs Reviewed  CBC WITH DIFFERENTIAL/PLATELET - Abnormal; Notable for the following components:      Result Value   WBC 18.8 (*)    RBC 3.32 (*)    Hemoglobin 9.8 (*)    HCT 29.7 (*)    Neutro Abs 16.1 (*)    Abs Immature Granulocytes 0.17 (*)    All other components within normal limits  COMPREHENSIVE METABOLIC PANEL - Abnormal; Notable for the following components:   Sodium 130 (*)    Chloride 95 (*)    Glucose, Bld 175 (*)    Creatinine, Ser 1.09 (*)    Calcium 8.8 (*)    Albumin 2.9 (*)    Alkaline Phosphatase 139 (*)    Total Bilirubin 1.4 (*)    GFR, Estimated 59 (*)    All other components within normal limits  URINALYSIS, ROUTINE W REFLEX MICROSCOPIC  LIPASE, BLOOD    EKG None  Radiology CT  ABDOMEN PELVIS W CONTRAST  Result Date: 12/24/2021 CLINICAL DATA:  Renal mass/cyst, indeterminate renal fluid collection EXAM: CT ABDOMEN AND PELVIS WITH CONTRAST TECHNIQUE: Multidetector CT imaging of the abdomen and pelvis was performed using the standard protocol following bolus administration of intravenous contrast. RADIATION DOSE REDUCTION: This exam was performed according to the departmental dose-optimization program which includes  automated exposure control, adjustment of the mA and/or kV according to patient size and/or use of iterative reconstruction technique. CONTRAST:  149m OMNIPAQUE IOHEXOL 300 MG/ML  SOLN COMPARISON:  Noncontrast CT 12/24/2021, MRI 10/13/2021 FINDINGS: Lower chest: Small left pleural effusion with associated left basilar compressive atelectasis. Mild discoid atelectasis within the right lung base. Extensive coronary artery calcification. Global cardiac size within normal limits. Hepatobiliary: Morphologic changes in keeping with cirrhosis. Geographic peripheral areas of enhancement within the liver predominantly within segments 4 and 8 with associated capsular retraction may represent areas of confluent hepatic fibrosis. No definite intrahepatic enhancing mass identified. Stable moderate extrahepatic biliary ductal dilation, tapering within the pancreatic head likely related to changes of chronic pancreatitis. Cholecystectomy has been performed. Pancreas: Mild pancreatic parenchymal atrophy. Intraparenchymal loculated fluid collection within the tail the pancreas in keeping with a pancreatic pseudocyst appears decreased when compared to remote prior MRI examination, measuring 2.7 x 4.2 cm at axial image # 31/2, but is in continuity with the fluid collection seen within the a left posterior pararenal space which represents retroperitoneal extension of the pancreatic pseudocyst. This rim enhancing, septated collection measures 8.0 x 8.9 x 12.9 cm in greatest dimension on axial image  # 41 and coronal image # 92. Spleen: Unremarkable Adrenals/Urinary Tract: The adrenal glands are unremarkable. The kidneys are normal in size and position. Mild mass effect upon the left kidney related to the left posterior pararenal pseudocyst. No hydronephrosis. No enhancing intrarenal masses. No intrarenal or ureteral calculi. The bladder is decompressed. Stomach/Bowel: Mild sigmoid diverticulosis. Small gastric varices are noted within the gastric fundus. Stomach, small bowel, and large bowel are otherwise unremarkable. Appendix normal. Moderate ascites is present, increased in volume when compared to prior MRI examination. No free intraperitoneal gas. Vascular/Lymphatic: Aortic atherosclerosis. No enlarged abdominal or pelvic lymph nodes. Reproductive: Uterus and bilateral adnexa are unremarkable. Other: No abdominal wall hernia Musculoskeletal: No acute bone abnormality. IMPRESSION: 1. Morphologic changes in keeping with cirrhosis and portal venous hypertension with small gastric varices. Moderate ascites, increased in volume when compared to prior MRI examination. 2. Interval decrease in size of a pancreatic pseudocyst within the tail the pancreas since MRI examination 10/13/2021, however, there is now a septated, rim-enhancing fluid collection within the left posterior pararenal space measuring up to 12.9 cm in greatest dimension. This is in continuity with the pancreatic pseudocyst and represents retroperitoneal extension of the pancreatic pseudocyst. 3. Extensive coronary artery calcification. 4. Mild sigmoid diverticulosis. Aortic Atherosclerosis (ICD10-I70.0). Electronically Signed   By: AFidela SalisburyM.D.   On: 12/24/2021 19:29   CT Renal Stone Study  Result Date: 12/24/2021 CLINICAL DATA:  Left flank swelling for the past 5 days. EXAM: CT ABDOMEN AND PELVIS WITHOUT CONTRAST TECHNIQUE: Multidetector CT imaging of the abdomen and pelvis was performed following the standard protocol without IV  contrast. RADIATION DOSE REDUCTION: This exam was performed according to the departmental dose-optimization program which includes automated exposure control, adjustment of the mA and/or kV according to patient size and/or use of iterative reconstruction technique. COMPARISON:  10/13/2021 MRI FINDINGS: Lower chest: Right hemidiaphragm elevation. Bibasilar atelectasis. Normal heart size with right coronary artery calcification. Trace left pleural fluid. Hepatobiliary: Advanced cirrhosis, with caudate lobe enlargement. Cholecystectomy. Possible stones within the cystic duct remnant on 32/2. Common duct dilatation is suboptimally evaluated but felt to be less impressive than on the prior MRI. Pancreas: Suboptimally evaluated fluid collection adjacent the pancreatic tail measures on the order of 6.3 x 2.6 cm on 35/2 versus 6.2  x 3.8 cm when remeasured in a similar fashion on the prior MRI. Spleen: Normal noncontrast appearance including at 12.1 cm craniocaudal. Adrenals/Urinary Tract: Normal adrenal glands. Normal noncontrast appearance of the right kidney. A new fluid collection within the left perirenal space measures 7.4 x 4.8 cm on 36/2. An adjacent or contiguous more superficial component, extending into the left flank, is also new since the prior MRI at 5.5 x 3.2 cm on 39/2. No hydronephrosis. No bladder calculi. Stomach/Bowel: Proximal gastric underdistention. Extensive colonic diverticulosis. Normal appendix. Normal small bowel caliber. Vascular/Lymphatic: Aortic atherosclerosis. Favor portosystemic collaterals within the omentum indicative of portal venous hypertension. No abdominopelvic adenopathy. Reproductive: Normal uterus and adnexa. Other: Moderate volume abdominopelvic ascites, similar to the prior MRI. No free intraperitoneal air. Anasarca is asymmetric left. Musculoskeletal: No acute osseous abnormality. IMPRESSION: 1. Limited exam secondary to stone study technique 2. Development of left perinephric  fluid collection with adjacent component versus satellite collection extending into the left flank. Given clinical history, most likely related to progressive pseudocysts. 3. Relatively similar appearance of a presumed pseudocyst adjacent the pancreatic tail. 4. Cirrhosis with probable portal venous hypertension and moderate volume ascites. Small left pleural effusion. 5. Age advanced coronary artery atherosclerosis. Recommend assessment of coronary risk factors. 6. Cholecystectomy with possible stones within the cystic duct remnant. 7.  Aortic Atherosclerosis (ICD10-I70.0). Electronically Signed   By: Abigail Miyamoto M.D.   On: 12/24/2021 15:14    Procedures Procedures   Medications Ordered in ED Medications  ketorolac (TORADOL) 15 MG/ML injection 30 mg (15 mg Intramuscular Given 12/24/21 1358)  oxyCODONE-acetaminophen (PERCOCET/ROXICET) 5-325 MG per tablet 1 tablet (1 tablet Oral Given 12/24/21 1619)  iohexol (OMNIPAQUE) 300 MG/ML solution 100 mL (100 mLs Intravenous Contrast Given 12/24/21 1829)    ED Course/ Medical Decision Making/ A&P Clinical Course as of 12/24/21 2050  Sun Dec 24, 2021  1423 Chart review reveals recent C diff infection where her wbc was elevated to 15 at that time. Has since went down to baseline. Elevated again today [GL]    Clinical Course User Index [GL] Addalynne Golding, Adora Fridge, PA-C                           Medical Decision Making Amount and/or Complexity of Data Reviewed Labs: ordered. Radiology: ordered.  Risk Prescription drug management.    MDM  This is a 61 y.o. female who presents to the ED with left flank pain/swelling  Initial Impression  Well appearing, mildly tachycardic.  Intense pain overlying the left flank as well as mild to moderate soft tissue swelling. No skin changes to suggest cellulitis or shingles. Not in the area of the spleen so doubtful of splenic rupture. This is essentially atraumatic.  I have ordered Renal CT to evaluate kidneys. Labs  and UA also ordered.  I personally ordered, reviewed, and interpreted all laboratory work and imaging and agree with radiologist interpretation. Results interpreted below:  WBC 18.8, hgb stable Na 130 (stable), renal function stable, LFTS stable Lipase 22 UA negative for bacteria CT stone study - development of left perinephric fluid collection with adjacent component versus satellite collection extending into the left flank - Stable pancreatic pseudocyst - Cirrhosis with portal HTN findings, and moderate volume ascites - small left pleural effusion  CT a/p with contrast Interval decrease in size of a pancreatic pseudocyst within the  tail the pancreas since MRI examination 10/13/2021, however, there  is now a septated, rim-enhancing fluid collection  within the left  posterior pararenal space measuring up to 12.9 cm in greatest  dimension. This is in continuity with the pancreatic pseudocyst and  represents retroperitoneal extension of the pancreatic pseudocyst.    Assessment/Plan:  Workup reveals a new leukocytosis as well as these CT findings of a left perinephric fluid collection that is new and were not noted on recent renal US or MRI from June. I have discussed case with radiology to determine if a contrasted study would better differentiate this in case it is a possible perinephric abscess given her leukocytosis.  He feels that it is likely indicative of a pseudocyst, however agrees that non contrasted study is limited. I have ordered a contrasted CT a/p to further evaluate. Urine not indicative of UTI/pyelo.  _0 , CT imaging is notable for decrease in size of her known pancreatic pseudocyst within the pancreatic tail, however now there is a septated, rim-enhancing fluid collection within the posterior pararenal space measuring up to 12.9 cm in continuity with the pancreatic pseudocyst and represents retroperitoneal extension of the pancreatic pseudocyst.  It is also causing mass  effect on the left kidney.  _1 , I discussed this case with general surgery, Dr. Zenia Resides.  She has reviewed the CT scan imaging and feels that this is likely a large pseudocyst with possible stricture in the head of the pancreas.  Recommends medicine admission as well as GI consult in the morning for possible EUS or ERCP. Hold off on antibiotics unless she develops a fever or other infectious symptoms.   Charting Requirements Additional history is obtained from:  Independent historian External Records from outside source obtained and reviewed including: Recent PCP note, recent US imaging Social Determinants of Health:  none Pertinant PMH that complicates patient's illness: Cirrhosis  Patient Care Problems that were addressed during this visit: - Left flank pain: Acute illness with complication - Pancreatitic Pseudocyst: Acute illness with complication This patient was maintained on a cardiac monitor/telemetry. I personally viewed and interpreted the cardiac monitor which reveals an underlying rhythm of NSR/ST Medications given in ED: Toradol, Percocet Reevaluation of the patient after these medicines showed that the patient improved I have reviewed home medications and made changes accordingly.  Critical Care Interventions: n/a Consultations: Radiology, General Surgery, [facility] Disposition: Admit  This is a shared visit with my attending physician, Dr. Zenia Resides.  We have discussed this patient and they have independently evaluated this patient. The plan was altered or changed as needed.  Portions of this note were generated with [employer]. Dictation errors may occur despite best attempts at proofreading.     Final Clinical Impression(s) / ED Diagnoses Final diagnoses:  Left flank pain  Pancreatic pseudocyst    Rx / DC Orders ED Discharge Orders     None         Adolphus Birchwood, PA-C 12/24/21 2050    Lacretia Leigh, MD 12/27/21 1529

## 2021-12-24 NOTE — H&P (Signed)
History and Physical    PEARLE WANDLER KGM:[id number] DOB: May 07, 1961 DOA: 12/24/2021  PCP: [name], MD  Patient coming from: Home  Chief Complaint: Flank pain  HPI: Mallory Stewart is a 61 y.o. female with medical history significant of insulin-dependent type 2 diabetes, hypertension, chronic hyponatremia, alcoholic cirrhosis, chronic pancreatitis, pancreatic pseudocyst/cyst, GERD, CKD stage II-IIIa presented to ED complaining of left flank pain and swelling.  Slightly tachycardic but afebrile.  Labs showing WBC 18.8, hemoglobin 9.8 (stable since labs done 11 days ago), sodium 130 (stable), creatinine 1.0, alkaline phosphatase and T. bili mildly elevated but stable, transaminases normal, lipase normal, UA without signs of infection. CT showing interval decrease in size of a pancreatic pseudocyst within the tail of the pancreas since MRI examination 10/13/2021, however, there is now a septated, rim-enhancing fluid collection within the left posterior pararenal space measuring up to 12.9 cm in greatest dimension.  This is in continuity with the pancreatic pseudocyst and represents retroperitoneal extension of the pancreatic pseudocyst. General surgery consulted.  Patient was given Toradol and Percocet for pain.  Patient states 6 days ago she slipped but did not fall.  2 days later she started noticing pain and swelling in her left flank region which she thought was due to a pulled muscle.  The pain has gotten progressively worse which prompted her to come into the ED to be evaluated.  Denies abdominal pain, nausea, or vomiting.  States she was previously taking both Lasix and spironolactone every day for fluid in her abdomen but her gastroenterologist had to stop spironolactone and decrease the dose of Lasix to every other day due to her sodium being low.  Patient states she was sick with UTI and a respiratory infection last month and was prescribed antibiotics after which she developed C.  difficile infection.  Not having any diarrhea at this time.  No other complaints.  Denies fevers, cough, shortness of breath, chest pain, or lower extremity edema.  Review of Systems:  Review of Systems  All other systems reviewed and are negative.   Past Medical History:  Diagnosis Date   Alcohol-induced chronic pancreatitis (HCC)    Alcoholic cirrhosis ([place])    B12 deficiency    Concussion    DDD (degenerative disc disease), cervical    Diabetes ([place])    DKA, type 2 ([place])    Gastric outlet obstruction 08/12/2018   Hypertension    Neuropathy    Pancreatic pseudocyst/cyst 05/06/2013   Seasonal allergies     Past Surgical History:  Procedure Laterality Date   ANKLE ARTHROSCOPY Right 12/20/2020   Procedure: RIGHT ANKLE ARTHROSCOPIC DEBRIDEMENT;  Surgeon: Newt Minion, MD;  Location: [place];  Service: Orthopedics;  Laterality: Right;   BIOPSY  01/19/2019   Procedure: BIOPSY;  Surgeon: Rush Landmark Telford Nab., MD;  Location: [place];  Service: Gastroenterology;;   CHOLECYSTECTOMY N/A 07/28/2016   Procedure: LAPAROSCOPIC CHOLECYSTECTOMY WITH INTRAOPERATIVE CHOLANGIOGRAM;  Surgeon: Mickeal Skinner, MD;  Location: [place];  Service: General;  Laterality: N/A;   ESOPHAGOGASTRODUODENOSCOPY (EGD) WITH PROPOFOL N/A 08/13/2018   Procedure: ESOPHAGOGASTRODUODENOSCOPY (EGD) WITH PROPOFOL;  Surgeon: Irving Copas., MD;  Location: [place];  Service: Gastroenterology;  Laterality: N/A;   ESOPHAGOGASTRODUODENOSCOPY (EGD) WITH PROPOFOL N/A 01/19/2019   Procedure: ESOPHAGOGASTRODUODENOSCOPY (EGD) WITH PROPOFOL;  Surgeon: Rush Landmark Telford Nab., MD;  Location: [place];  Service: Gastroenterology;  Laterality: N/A;   IR PARACENTESIS  08/08/2020   IR PARACENTESIS  03/13/2021   LEEP  1990's  ORIF FOOT FRACTURE  09/2008   L 5th metatarsal   REFRACTIVE SURGERY  2000   TIBIA IM NAIL INSERTION Left 03/12/2021   Procedure: INTRAMEDULLARY (IM) NAIL TIBIAL;  Surgeon:  Jessy Oto, MD;  Location: [place];  Service: Orthopedics;  Laterality: Left;   TONSILLECTOMY     UPPER ESOPHAGEAL ENDOSCOPIC ULTRASOUND (EUS) N/A 08/13/2018   Procedure: UPPER ESOPHAGEAL ENDOSCOPIC ULTRASOUND (EUS);  Surgeon: Irving Copas., MD;  Location: [place];  Service: Gastroenterology;  Laterality: N/A;   UPPER ESOPHAGEAL ENDOSCOPIC ULTRASOUND (EUS) N/A 01/19/2019   Procedure: UPPER ESOPHAGEAL ENDOSCOPIC ULTRASOUND (EUS);  Surgeon: Irving Copas., MD;  Location: [place];  Service: Gastroenterology;  Laterality: N/A;     reports that she has never smoked. She has never used smokeless tobacco. She reports that she does not currently use alcohol after a past usage of about 7.0 standard drinks of alcohol per week. She reports that she does not use drugs.  Allergies  Allergen Reactions   Augmentin [Amoxicillin-Pot Clavulanate] Dermatitis   Gluten Meal Other (See Comments)    Stomach distress   Zocor [Simvastatin] Other (See Comments)    Elevated lfts?    Family History  Problem Relation Age of Onset   Other Mother        tachycardia.Marland KitchenMarland Kitchen?afib   Stroke Father        after hernia suegery   Prostate cancer Father    Other Father        global transient amnesia, unclear source   Atrial fibrillation Sister    Healthy Brother    Healthy Brother    Coronary artery disease Paternal Grandmother    Heart attack Paternal Grandmother 81   Brain cancer Maternal Grandfather        ?   Cancer Paternal Grandfather        ?   Breast cancer Maternal Grandmother     Prior to Admission medications   Medication Sig Start Date End Date Taking? Authorizing Provider  amitriptyline (ELAVIL) 75 MG tablet Take 1 tablet (75 mg total) by mouth at bedtime. 03/24/21   [name], MD  aspirin EC 81 MG EC tablet Take 1 tablet (81 mg total) by mouth 2 (two) times daily. Swallow whole. 03/17/21   Jessy Oto, MD  Blood Glucose Monitoring Suppl (ONE TOUCH ULTRA 2) w/Device  KIT Use to check blood sugars two times day 08/01/18   [name], MD  cetirizine (ZYRTEC) 10 MG tablet Take 10 mg by mouth at bedtime.    [provider]  FEROSUL 325 (65 Fe) MG tablet TAKE 1 TABLET(325 MG) BY MOUTH TWICE DAILY WITH A MEAL 07/19/21   Jessy Oto, MD  furosemide (LASIX) 20 MG tablet Take 1 tablet (20 mg total) by mouth daily. Patient taking differently: Take 20 mg by mouth every other day. 07/31/21   Gatha Mayer, MD  glucose blood (ONETOUCH ULTRA) test strip CHECK BLOOD SUGAR TWICE DAILY AS DIRECTED 12/13/21   Bedsole, Amy E, MD  Insulin Glargine (BASAGLAR KWIKPEN) 100 UNIT/ML Inject 36 Units into the skin daily. 11/20/21   Shamleffer, Melanie Crazier, MD  insulin lispro (HUMALOG KWIKPEN) 100 UNIT/ML KwikPen Max Daily 30 units 11/23/21   Shamleffer, Melanie Crazier, MD  Insulin Pen Needle 32G X 4 MM MISC 1 Device by Does not apply route in the morning, at noon, in the evening, and at bedtime. 11/20/21   Shamleffer, Melanie Crazier, MD  Lancets [facility] ULTRASOFT) lancets Use to check  blood sugar two times a day. 08/01/18   [name], MD  meclizine (ANTIVERT) 25 MG tablet Take 1/2 tablet po qhs prn vertigo 11/23/21   Eugenia Pancoast, FNP  Multiple Vitamin (MULTIVITAMIN WITH MINERALS) TABS tablet Take 1 tablet by mouth daily. 07/25/18   Mikhail, Velta Addison, DO  pantoprazole (PROTONIX) 40 MG tablet TAKE 1 TABLET BY MOUTH TWICE DAILY BEFORE A MEAL 10/19/21   Gatha Mayer, MD    Physical Exam: Vitals:   12/24/21 1232 12/24/21 1235 12/24/21 1237 12/24/21 1620  BP: 113/73   119/74  Pulse: (!) 108  (!) 102 87  Resp: 16   17  Temp: 98.5 F (36.9 C)     TempSrc: Oral     SpO2: 96%   97%  Weight:  65.8 kg    Height:  $Remove'5\' 9"'iREPuzR$  (1.753 m)      Physical Exam Vitals reviewed.  Constitutional:      General: She is not in acute distress. HENT:     Head: Normocephalic and atraumatic.  Eyes:     Extraocular Movements: Extraocular movements intact.  Cardiovascular:      Rate and Rhythm: Regular rhythm.     Pulses: Normal pulses.     Comments: Slightly tachycardic Pulmonary:     Effort: Pulmonary effort is normal. No respiratory distress.     Breath sounds: Normal breath sounds. No wheezing or rales.  Abdominal:     General: Bowel sounds are normal. There is no distension.     Palpations: Abdomen is soft.     Tenderness: There is no abdominal tenderness. There is no guarding.  Musculoskeletal:        General: No swelling or tenderness.     Cervical back: Normal range of motion.  Skin:    General: Skin is warm and dry.  Neurological:     General: No focal deficit present.     Mental Status: She is alert and oriented to person, place, and time.      Labs on Admission: I have personally reviewed following labs and imaging studies  CBC: Recent Labs  Lab 12/24/21 1351  WBC 18.8*  NEUTROABS 16.1*  HGB 9.8*  HCT 29.7*  MCV 89.5  PLT 630   Basic Metabolic Panel: Recent Labs  Lab 12/24/21 1351  NA 130*  K 4.4  CL 95*  CO2 26  GLUCOSE 175*  BUN 14  CREATININE 1.09*  CALCIUM 8.8*   GFR: Estimated Creatinine Clearance: 58.4 mL/min (A) (by C-G formula based on SCr of 1.09 mg/dL (H)). Liver Function Tests: Recent Labs  Lab 12/24/21 1351  AST 19  ALT 9  ALKPHOS 139*  BILITOT 1.4*  PROT 7.0  ALBUMIN 2.9*   Recent Labs  Lab 12/24/21 1351  LIPASE 22   No results for input(s): "AMMONIA" in the last 168 hours. Coagulation Profile: No results for input(s): "INR", "PROTIME" in the last 168 hours. Cardiac Enzymes: No results for input(s): "CKTOTAL", "CKMB", "CKMBINDEX", "TROPONINI" in the last 168 hours. BNP (last 3 results) Recent Labs    11/28/21 0831  PROBNP 69.0   HbA1C: No results for input(s): "HGBA1C" in the last 72 hours. CBG: No results for input(s): "GLUCAP" in the last 168 hours. Lipid Profile: No results for input(s): "CHOL", "HDL", "LDLCALC", "TRIG", "CHOLHDL", "LDLDIRECT" in the last 72 hours. Thyroid Function  Tests: No results for input(s): "TSH", "T4TOTAL", "FREET4", "T3FREE", "THYROIDAB" in the last 72 hours. Anemia Panel: No results for input(s): "VITAMINB12", "FOLATE", "FERRITIN", "TIBC", "IRON", "RETICCTPCT"  in the last 72 hours. Urine analysis:    Component Value Date/Time   COLORURINE YELLOW 12/24/2021 1837   APPEARANCEUR CLEAR 12/24/2021 1837   APPEARANCEUR Hazy 04/07/2013 2105   LABSPEC 1.014 12/24/2021 1837   LABSPEC 1.020 04/07/2013 2105   PHURINE 5.0 12/24/2021 1837   GLUCOSEU NEGATIVE 12/24/2021 1837   GLUCOSEU 150 mg/dL 04/07/2013 2105   HGBUR NEGATIVE 12/24/2021 1837   HGBUR negative 11/03/2008 0822   BILIRUBINUR NEGATIVE 12/24/2021 1837   BILIRUBINUR Negative 04/07/2013 2105   KETONESUR NEGATIVE 12/24/2021 1837   PROTEINUR NEGATIVE 12/24/2021 1837   UROBILINOGEN 0.2 11/03/2008 0822   NITRITE NEGATIVE 12/24/2021 1837   LEUKOCYTESUR NEGATIVE 12/24/2021 1837   LEUKOCYTESUR Negative 04/07/2013 2105    Radiological Exams on Admission: I have personally reviewed images CT ABDOMEN PELVIS W CONTRAST  Result Date: 12/24/2021 CLINICAL DATA:  Renal mass/cyst, indeterminate renal fluid collection EXAM: CT ABDOMEN AND PELVIS WITH CONTRAST TECHNIQUE: Multidetector CT imaging of the abdomen and pelvis was performed using the standard protocol following bolus administration of intravenous contrast. RADIATION DOSE REDUCTION: This exam was performed according to the departmental dose-optimization program which includes automated exposure control, adjustment of the mA and/or kV according to patient size and/or use of iterative reconstruction technique. CONTRAST:  181mL OMNIPAQUE IOHEXOL 300 MG/ML  SOLN COMPARISON:  Noncontrast CT 12/24/2021, MRI 10/13/2021 FINDINGS: Lower chest: Small left pleural effusion with associated left basilar compressive atelectasis. Mild discoid atelectasis within the right lung base. Extensive coronary artery calcification. Global cardiac size within normal limits.  Hepatobiliary: Morphologic changes in keeping with cirrhosis. Geographic peripheral areas of enhancement within the liver predominantly within segments 4 and 8 with associated capsular retraction may represent areas of confluent hepatic fibrosis. No definite intrahepatic enhancing mass identified. Stable moderate extrahepatic biliary ductal dilation, tapering within the pancreatic head likely related to changes of chronic pancreatitis. Cholecystectomy has been performed. Pancreas: Mild pancreatic parenchymal atrophy. Intraparenchymal loculated fluid collection within the tail the pancreas in keeping with a pancreatic pseudocyst appears decreased when compared to remote prior MRI examination, measuring 2.7 x 4.2 cm at axial image # 31/2, but is in continuity with the fluid collection seen within the a left posterior pararenal space which represents retroperitoneal extension of the pancreatic pseudocyst. This rim enhancing, septated collection measures 8.0 x 8.9 x 12.9 cm in greatest dimension on axial image # 41 and coronal image # 92. Spleen: Unremarkable Adrenals/Urinary Tract: The adrenal glands are unremarkable. The kidneys are normal in size and position. Mild mass effect upon the left kidney related to the left posterior pararenal pseudocyst. No hydronephrosis. No enhancing intrarenal masses. No intrarenal or ureteral calculi. The bladder is decompressed. Stomach/Bowel: Mild sigmoid diverticulosis. Small gastric varices are noted within the gastric fundus. Stomach, small bowel, and large bowel are otherwise unremarkable. Appendix normal. Moderate ascites is present, increased in volume when compared to prior MRI examination. No free intraperitoneal gas. Vascular/Lymphatic: Aortic atherosclerosis. No enlarged abdominal or pelvic lymph nodes. Reproductive: Uterus and bilateral adnexa are unremarkable. Other: No abdominal wall hernia Musculoskeletal: No acute bone abnormality. IMPRESSION: 1. Morphologic changes in  keeping with cirrhosis and portal venous hypertension with small gastric varices. Moderate ascites, increased in volume when compared to prior MRI examination. 2. Interval decrease in size of a pancreatic pseudocyst within the tail the pancreas since MRI examination 10/13/2021, however, there is now a septated, rim-enhancing fluid collection within the left posterior pararenal space measuring up to 12.9 cm in greatest dimension. This is in continuity with the pancreatic  pseudocyst and represents retroperitoneal extension of the pancreatic pseudocyst. 3. Extensive coronary artery calcification. 4. Mild sigmoid diverticulosis. Aortic Atherosclerosis (ICD10-I70.0). Electronically Signed   By: Fidela Salisbury M.D.   On: 12/24/2021 19:29   CT Renal Stone Study  Result Date: 12/24/2021 CLINICAL DATA:  Left flank swelling for the past 5 days. EXAM: CT ABDOMEN AND PELVIS WITHOUT CONTRAST TECHNIQUE: Multidetector CT imaging of the abdomen and pelvis was performed following the standard protocol without IV contrast. RADIATION DOSE REDUCTION: This exam was performed according to the departmental dose-optimization program which includes automated exposure control, adjustment of the mA and/or kV according to patient size and/or use of iterative reconstruction technique. COMPARISON:  10/13/2021 MRI FINDINGS: Lower chest: Right hemidiaphragm elevation. Bibasilar atelectasis. Normal heart size with right coronary artery calcification. Trace left pleural fluid. Hepatobiliary: Advanced cirrhosis, with caudate lobe enlargement. Cholecystectomy. Possible stones within the cystic duct remnant on 32/2. Common duct dilatation is suboptimally evaluated but felt to be less impressive than on the prior MRI. Pancreas: Suboptimally evaluated fluid collection adjacent the pancreatic tail measures on the order of 6.3 x 2.6 cm on 35/2 versus 6.2 x 3.8 cm when remeasured in a similar fashion on the prior MRI. Spleen: Normal noncontrast  appearance including at 12.1 cm craniocaudal. Adrenals/Urinary Tract: Normal adrenal glands. Normal noncontrast appearance of the right kidney. A new fluid collection within the left perirenal space measures 7.4 x 4.8 cm on 36/2. An adjacent or contiguous more superficial component, extending into the left flank, is also new since the prior MRI at 5.5 x 3.2 cm on 39/2. No hydronephrosis. No bladder calculi. Stomach/Bowel: Proximal gastric underdistention. Extensive colonic diverticulosis. Normal appendix. Normal small bowel caliber. Vascular/Lymphatic: Aortic atherosclerosis. Favor portosystemic collaterals within the omentum indicative of portal venous hypertension. No abdominopelvic adenopathy. Reproductive: Normal uterus and adnexa. Other: Moderate volume abdominopelvic ascites, similar to the prior MRI. No free intraperitoneal air. Anasarca is asymmetric left. Musculoskeletal: No acute osseous abnormality. IMPRESSION: 1. Limited exam secondary to stone study technique 2. Development of left perinephric fluid collection with adjacent component versus satellite collection extending into the left flank. Given clinical history, most likely related to progressive pseudocysts. 3. Relatively similar appearance of a presumed pseudocyst adjacent the pancreatic tail. 4. Cirrhosis with probable portal venous hypertension and moderate volume ascites. Small left pleural effusion. 5. Age advanced coronary artery atherosclerosis. Recommend assessment of coronary risk factors. 6. Cholecystectomy with possible stones within the cystic duct remnant. 7.  Aortic Atherosclerosis (ICD10-I70.0). Electronically Signed   By: Abigail Miyamoto M.D.   On: 12/24/2021 15:14    Assessment and Plan  Enlarging pancreatic pseudocyst SIRS/ ?sepsis Slightly tachycardic and WBC 18.8, however, afebrile.  CT showing interval decrease in size of a pancreatic pseudocyst within the tail of the pancreas since MRI examination 10/13/2021, however, there is  now a septated, rim-enhancing fluid collection within the left posterior pararenal space measuring up to 12.9 cm in greatest dimension.  This is in continuity with the pancreatic pseudocyst and represents retroperitoneal extension of the pancreatic pseudocyst.  General surgery feels that she has a stricture of the main pancreatic duct in the head of the pancreas leading to a blowout in the tail with subsequent pseudocyst formation.  Dr. Zenia Resides feels that the pseudocyst does not appear infected on imaging but it is symptomatic and will likely require drainage.  She recommends discussing with GI in the morning regarding the possibility of ERCP to further delineate her pancreatic duct anatomy and if a pancreatic duct stricture  is confirmed, determine if endoscopic treatment options are available.  She feels that the patient is not a candidate for surgical interventions due to her cirrhosis with portal hypertension.  Not recommending antibiotic treatment at this time, patient was treated for C. difficile last month.  General surgery will discuss case with GI in the morning as patient may require percutaneous drainage of the pseudocyst if there is no endoscopic window for drainage.   -I have sent a message to Dr. Lyndel Safe on-call for [facility] requesting consultation in the morning. -Keep n.p.o. after midnight -Pain management -Check lactate -Order blood cultures -Monitor WBC count  Poorly controlled insulin-dependent type 2 diabetes A1c 12.1 on 11/20/2021. -Pharmacy med rec pending. -Sliding scale insulin  Alcoholic cirrhosis with ascites MRI done in June showing moderate to large volume ascites.  CT done today showing moderate volume ascites.  On exam, abdomen does not appear distended and is soft and nontender to palpation.  No fever.  SBP felt to be less likely. -Continue Lasix after pharmacy med rec is done. -GI consulted  Chronic hyponatremia Stable mild hyponatremia likely due to  cirrhosis. -Continue to monitor  CKD stage II-IIIa Creatinine stable. -Continue to monitor  Hypertension GERD -Pharmacy med rec pending.  DVT prophylaxis: SCDs Code Status: Full Code (discussed with the patient) Family Communication: No family available at this time. Consults called: General surgery, GI Level of care: Telemetry bed Admission status: It is my clinical opinion that admission to INPATIENT is reasonable and necessary because of the expectation that this patient will require hospital care that crosses at least 2 midnights to treat this condition based on the medical complexity of the problems presented.  Given the aforementioned information, the predictability of an adverse outcome is felt to be significant.   Shela Leff MD [facility]  If 7PM-7AM, please contact night-coverage [email or web address]  12/24/2021, 9:32 PM

## 2021-12-24 NOTE — Consult Note (Signed)
ABRIELLA FILKINS May 07, 1961  [id number].    Requesting MD: Theophilus Kinds, PA-C Chief Complaint/Reason for Consult: pancreatic fluid collection  HPI:  Ms. Rotunno is a 61 yo female with a history of cirrhosis and chronic pancreatitis secondary to EtOH use. She presented to the ED today with worsening left flank and back pain, which began about 4 days ago. She denies nausea, vomiting, fevers or chills. She has not had abdominal pain. Labs in the ED are significant for a WBC of 18. A CT scan showed a large collection in the left retroperitoneum communicating with the tail of the pancreas. Surgery was consulted.  The patient follows with Dr. Leone Payor for her cirrhosis, and is on diuretic for management of her ascites. She has had known chronic CBD dilation and underwent an EUS in 2020, which showed changes of pancreatitis with no mass in the head of the pancreas. She recently had an MRCP on 6/12 which showed diffuse CBD dilation and smooth tapering in the head of the pancreas, as well as a fluid collection in the tail of the pancreas measuring 6.7cm.  She recently saw Dr. Leone Payor earlier this month and was referred to [facility]. CA19-9 at that time was normal at 15. Based on most recent labs she is a Child-Pugh class B with a MELD of 19.  ROS: Review of Systems  Constitutional:  Negative for chills and fever.  Respiratory:  Negative for shortness of breath.   Cardiovascular:  Negative for chest pain.  Gastrointestinal:  Negative for abdominal pain, nausea and vomiting.  Genitourinary:  Positive for flank pain.  Musculoskeletal:  Positive for back pain.    Family History  Problem Relation Age of Onset   Other Mother        tachycardia.Marland KitchenMarland Kitchen?afib   Stroke Father        after hernia suegery   Prostate cancer Father    Other Father        global transient amnesia, unclear source   Atrial fibrillation Sister    Healthy Brother    Healthy Brother    Coronary artery disease Paternal  Grandmother    Heart attack Paternal Grandmother 61   Brain cancer Maternal Grandfather        ?   Cancer Paternal Grandfather        ?   Breast cancer Maternal Grandmother     Past Medical History:  Diagnosis Date   Alcohol-induced chronic pancreatitis (HCC)    Alcoholic cirrhosis (HCC)    B12 deficiency    Concussion    DDD (degenerative disc disease), cervical    Diabetes (HCC)    DKA, type 2 (HCC)    Gastric outlet obstruction 08/12/2018   Hypertension    Neuropathy    Pancreatic pseudocyst/cyst 05/06/2013   Seasonal allergies     Past Surgical History:  Procedure Laterality Date   ANKLE ARTHROSCOPY Right 12/20/2020   Procedure: RIGHT ANKLE ARTHROSCOPIC DEBRIDEMENT;  Surgeon: Nadara Mustard, MD;  Location: [facility];  Service: Orthopedics;  Laterality: Right;   BIOPSY  01/19/2019   Procedure: BIOPSY;  Surgeon: Meridee Score Netty Starring., MD;  Location: [facility] ENDOSCOPY;  Service: Gastroenterology;;   CHOLECYSTECTOMY N/A 07/28/2016   Procedure: LAPAROSCOPIC CHOLECYSTECTOMY WITH INTRAOPERATIVE CHOLANGIOGRAM;  Surgeon: Rodman Pickle, MD;  Location: [facility] OR;  Service: General;  Laterality: N/A;   ESOPHAGOGASTRODUODENOSCOPY (EGD) WITH PROPOFOL N/A 08/13/2018   Procedure: ESOPHAGOGASTRODUODENOSCOPY (EGD) WITH PROPOFOL;  Surgeon: Lemar Lofty., MD;  Location: [facility] ENDOSCOPY;  Service:  Gastroenterology;  Laterality: N/A;   ESOPHAGOGASTRODUODENOSCOPY (EGD) WITH PROPOFOL N/A 01/19/2019   Procedure: ESOPHAGOGASTRODUODENOSCOPY (EGD) WITH PROPOFOL;  Surgeon: Meridee Score Netty Starring., MD;  Location: [facility] ENDOSCOPY;  Service: Gastroenterology;  Laterality: N/A;   IR PARACENTESIS  08/08/2020   IR PARACENTESIS  03/13/2021   LEEP  1990's   ORIF FOOT FRACTURE  09/2008   L 5th metatarsal   REFRACTIVE SURGERY  2000   TIBIA IM NAIL INSERTION Left 03/12/2021   Procedure: INTRAMEDULLARY (IM) NAIL TIBIAL;  Surgeon: Kerrin Champagne, MD;  Location: [facility] OR;  Service: Orthopedics;   Laterality: Left;   TONSILLECTOMY     UPPER ESOPHAGEAL ENDOSCOPIC ULTRASOUND (EUS) N/A 08/13/2018   Procedure: UPPER ESOPHAGEAL ENDOSCOPIC ULTRASOUND (EUS);  Surgeon: Lemar Lofty., MD;  Location: [facility] ENDOSCOPY;  Service: Gastroenterology;  Laterality: N/A;   UPPER ESOPHAGEAL ENDOSCOPIC ULTRASOUND (EUS) N/A 01/19/2019   Procedure: UPPER ESOPHAGEAL ENDOSCOPIC ULTRASOUND (EUS);  Surgeon: Lemar Lofty., MD;  Location: [facility] ENDOSCOPY;  Service: Gastroenterology;  Laterality: N/A;    Social History:  reports that she has never smoked. She has never used smokeless tobacco. She reports that she does not currently use alcohol after a past usage of about 7.0 standard drinks of alcohol per week. She reports that she does not use drugs.  Allergies:  Allergies  Allergen Reactions   Augmentin [Amoxicillin-Pot Clavulanate] Dermatitis   Gluten Meal Other (See Comments)    Stomach distress   Zocor [Simvastatin] Other (See Comments)    Elevated lfts?    (Not in a hospital admission)    Physical Exam: Blood pressure 119/74, pulse 87, temperature 98.5 F (36.9 C), temperature source Oral, resp. rate 17, height 5\' 9"  (1.753 m), weight 65.8 kg, SpO2 97 %. General: resting comfortably, NAD Neuro: alert and oriented, no focal deficits Resp: normal work of breathing on room air CV: RRR Abdomen: soft, mildly distended, nontender to palpation. There is fullness in the left flank and back with tenderness to palpation. Extremities: warm and well-perfused   Results for orders placed or performed during the hospital encounter of 12/24/21 (from the past 48 hour(s))  CBC with Differential     Status: Abnormal   Collection Time: 12/24/21  1:51 PM  Result Value Ref Range   WBC 18.8 (H) 4.0 - 10.5 K/uL   RBC 3.32 (L) 3.87 - 5.11 MIL/uL   Hemoglobin 9.8 (L) 12.0 - 15.0 g/dL   HCT 12/26/21 (L) 16.1 - 09.6 %   MCV 89.5 80.0 - 100.0 fL   MCH 29.5 26.0 - 34.0 pg   MCHC 33.0 30.0 - 36.0 g/dL   RDW  04.5 40.9 - 81.1 %   Platelets 302 150 - 400 K/uL   nRBC 0.0 0.0 - 0.2 %   Neutrophils Relative % 87 %   Neutro Abs 16.1 (H) 1.7 - 7.7 K/uL   Lymphocytes Relative 7 %   Lymphs Abs 1.4 0.7 - 4.0 K/uL   Monocytes Relative 5 %   Monocytes Absolute 1.0 0.1 - 1.0 K/uL   Eosinophils Relative 0 %   Eosinophils Absolute 0.0 0.0 - 0.5 K/uL   Basophils Relative 0 %   Basophils Absolute 0.1 0.0 - 0.1 K/uL   Immature Granulocytes 1 %   Abs Immature Granulocytes 0.17 (H) 0.00 - 0.07 K/uL    Comment: Performed at [facility], [address] [place], [place]  Comprehensive metabolic panel     Status: Abnormal   Collection Time: 12/24/21  1:51 PM  Result Value Ref Range   Sodium 130 (L) 135 - 145 mmol/L   Potassium 4.4 3.5 - 5.1 mmol/L   Chloride 95 (L) 98 - 111 mmol/L   CO2 26 22 - 32 mmol/L   Glucose, Bld 175 (H) 70 - 99 mg/dL    Comment: Glucose reference range applies only to samples taken after fasting for at least 8 hours.   BUN 14 6 - 20 mg/dL   Creatinine, Ser 2.13 (H) 0.44 - 1.00 mg/dL   Calcium 8.8 (L) 8.9 - 10.3 mg/dL   Total Protein 7.0 6.5 - 8.1 g/dL   Albumin 2.9 (L) 3.5 - 5.0 g/dL   AST 19 15 - 41 U/L   ALT 9 0 - 44 U/L   Alkaline Phosphatase 139 (H) 38 - 126 U/L   Total Bilirubin 1.4 (H) 0.3 - 1.2 mg/dL   GFR, Estimated 59 (L) >60 mL/min    Comment: (NOTE) Calculated using the CKD-EPI Creatinine Equation (2021)    Anion gap 9 5 - 15    Comment: Performed at [facility], [address], [place], [place] [address]  Lipase, blood     Status: None   Collection Time: 12/24/21  1:51 PM  Result Value Ref Range   Lipase 22 11 - 51 U/L    Comment: Performed at [facility], [address], [place], [place] [address]  Urinalysis, Routine w reflex microscopic Urine, Clean Catch     Status: None   Collection Time: 12/24/21  6:37 PM  Result Value Ref Range   Color, Urine YELLOW YELLOW   APPearance CLEAR CLEAR    Specific Gravity, Urine 1.014 1.005 - 1.030   pH 5.0 5.0 - 8.0   Glucose, UA NEGATIVE NEGATIVE mg/dL   Hgb urine dipstick NEGATIVE NEGATIVE   Bilirubin Urine NEGATIVE NEGATIVE   Ketones, ur NEGATIVE NEGATIVE mg/dL   Protein, ur NEGATIVE NEGATIVE mg/dL   Nitrite NEGATIVE NEGATIVE   Leukocytes,Ua NEGATIVE NEGATIVE    Comment: Performed at [facility], [address], [place], [place] [address]   Assessment/Plan This is a 61 yo female who presents to the ED with left flank/back pain and evidence of an enlarging pancreatic pseudocyst on imaging. The left retroperitoneal collection clearly communicates with the tail of the pancreas. Her CT today also shows moderate ascites and venous collateralization consistent with portal hypertension. On her MRCP a few months ago, she had a much smaller pseudocyst in the tail of the pancreas. Given the narrowing of her CBD in the head of the pancreas on MRI, and known chronic pancreatitis, I suspect she has a stricture of the main PD in the head of the pancreas, leading to a blowout in the tail with subsequent pseudocyst formation. The pseudocyst does not appear to be infected on imaging but it is symptomatic and will likely require drainage. Will discuss with GI in the morning regarding the possibility of ERCP to further delineate her PD anatomy and, if a PD stricture is confirmed, determine if endoscopic treatment options are available. The patient is not a candidate for surgical interventions due to her cirrhosis with portal hypertension. - Ok for patient to have a diet tonight - No need for antibiotics from a surgical standpoint - Will discuss case with GI in am. Patient may require percutaneous drainage of the pseudocyst if there is no endoscopic window for drainage. - Surgery will follow.   Sophronia Simas, MD [facility] Surgery General, Hepatobiliary and Pancreatic  Surgery 12/24/21 9:26 PM

## 2021-12-25 DIAGNOSIS — K7031 Alcoholic cirrhosis of liver with ascites: Secondary | ICD-10-CM | POA: Diagnosis not present

## 2021-12-25 DIAGNOSIS — E871 Hypo-osmolality and hyponatremia: Secondary | ICD-10-CM | POA: Diagnosis not present

## 2021-12-25 DIAGNOSIS — E1159 Type 2 diabetes mellitus with other circulatory complications: Secondary | ICD-10-CM

## 2021-12-25 DIAGNOSIS — K863 Pseudocyst of pancreas: Secondary | ICD-10-CM | POA: Diagnosis not present

## 2021-12-25 DIAGNOSIS — R109 Unspecified abdominal pain: Secondary | ICD-10-CM | POA: Diagnosis not present

## 2021-12-25 DIAGNOSIS — I152 Hypertension secondary to endocrine disorders: Secondary | ICD-10-CM

## 2021-12-25 LAB — GLUCOSE, CAPILLARY
Glucose-Capillary: 168 mg/dL — ABNORMAL HIGH (ref 70–99)
Glucose-Capillary: 149 mg/dL — ABNORMAL HIGH (ref 70–99)
Glucose-Capillary: 130 mg/dL — ABNORMAL HIGH (ref 70–99)
Glucose-Capillary: 96 mg/dL (ref 70–99)
Glucose-Capillary: 125 mg/dL — ABNORMAL HIGH (ref 70–99)

## 2021-12-25 LAB — CBC
HCT: 25.5 % — ABNORMAL LOW (ref 36.0–46.0)
Hemoglobin: 8.4 g/dL — ABNORMAL LOW (ref 12.0–15.0)
MCH: 29.5 pg (ref 26.0–34.0)
MCHC: 32.9 g/dL (ref 30.0–36.0)
MCV: 89.5 fL (ref 80.0–100.0)
Platelets: 301 10*3/uL (ref 150–400)
RBC: 2.85 MIL/uL — ABNORMAL LOW (ref 3.87–5.11)
RDW: 13.9 % (ref 11.5–15.5)
WBC: 18 10*3/uL — ABNORMAL HIGH (ref 4.0–10.5)
nRBC: 0 % (ref 0.0–0.2)

## 2021-12-25 LAB — COMPREHENSIVE METABOLIC PANEL
ALT: 8 U/L (ref 0–44)
AST: 15 U/L (ref 15–41)
Albumin: 2.5 g/dL — ABNORMAL LOW (ref 3.5–5.0)
Alkaline Phosphatase: 132 U/L — ABNORMAL HIGH (ref 38–126)
Anion gap: 9 (ref 5–15)
BUN: 18 mg/dL (ref 6–20)
CO2: 24 mmol/L (ref 22–32)
Calcium: 8.5 mg/dL — ABNORMAL LOW (ref 8.9–10.3)
Chloride: 97 mmol/L — ABNORMAL LOW (ref 98–111)
Creatinine, Ser: 1.33 mg/dL — ABNORMAL HIGH (ref 0.44–1.00)
GFR, Estimated: 46 mL/min — ABNORMAL LOW (ref >60)
Glucose, Bld: 166 mg/dL — ABNORMAL HIGH (ref 70–99)
Potassium: 4.5 mmol/L (ref 3.5–5.1)
Sodium: 130 mmol/L — ABNORMAL LOW (ref 135–145)
Total Bilirubin: 0.9 mg/dL (ref 0.3–1.2)
Total Protein: 6 g/dL — ABNORMAL LOW (ref 6.5–8.1)

## 2021-12-25 LAB — HIV ANTIBODY (ROUTINE TESTING W REFLEX): HIV Screen 4th Generation wRfx: NONREACTIVE

## 2021-12-25 MED ORDER — HYDROMORPHONE HCL 1 MG/ML IJ SOLN
0.5000 mg | INTRAMUSCULAR | Status: DC | PRN
  Administered 2021-12-25 (×2): 1 mg via INTRAVENOUS
  Administered 2021-12-25: 0.5 mg via INTRAVENOUS
  Filled 2021-12-25 (×3): qty 1

## 2021-12-25 MED ORDER — OXYCODONE HCL 5 MG PO TABS
5.0000 mg | ORAL_TABLET | ORAL | Status: DC | PRN
  Administered 2021-12-25: 5 mg via ORAL
  Administered 2021-12-26 – 2021-12-30 (×16): 10 mg via ORAL
  Filled 2021-12-25: qty 2
  Filled 2021-12-25: qty 1
  Filled 2021-12-25 (×10): qty 2
  Filled 2021-12-25: qty 1
  Filled 2021-12-25 (×5): qty 2

## 2021-12-25 NOTE — Consult Note (Signed)
Consultation Note   Referring Provider: [facility] PCP: Mallory Sanders, MD Primary Gastroenterologist: Mallory Rusk, MD Reason for consultation: enlarging pseudocyst  Hospital Day: 2  Assessment / Plan   # 61 yo female with ETOH cirrhosis with portal HTN with ascites. Etoh in remission. MELD Na+ is 20.   Recent reduction in diuretics by Mallory [facility] due to hyponatremia  [place] screening. AFP normal. No definite liver mass on CT scan with contrast 12/24/21 Varices screening: EGD Aug 2022 >. Type 1 isolated gastric varices. Small gastric varices on CT scan yesterday. Carvedilol stopped earlier this month due to low BP Recent confusion issues without history of hepatic encephalopathy. Evaluated by Neurology . EEG was unrevealing, (mildly slow). No confusion on my exam today  She has a new patient appointment with [facility] tomorrow but rescheduled for next month since in hospital  # Chronic Etoh pancreatitis complicated by pseudocyst / left mid back pain  CT scan yesterday showing interval decrease in size of a pancreatic pseudocyst within the tail the pancreas since MRI examination 10/13/2021, however, there is now a septated, rim-enhancing fluid collection within the left posterior pararenal space measuring up to 12.9 cm in greatest dimension. This is in continuity with the pancreatic pseudocyst and represents retroperitoneal extension of the pancreatic pseudocyst Pseudocyst doesn't appear infected on imaging.  Given her left flank / back pain the pseudocyst may need drainage. Will need to discuss with Mallory [facility] and perhaps our advanced biliary endoscopist General Surgery has evaluated and concerned about PD stricture CA 19-9 15  # Chronic CBD dilation.   See PMH for additional medical problems   HPI   Mallory Stewart is a 61 y.o. female with a past medical history significant for  decompensated Etoh cirrhosis, pancreatic  pseudocysts, C-diff infection, diverticulosis  See PMH for any additional medical problems.  Mallory Stewart was seen in the office on 12/13/21. From that note:    Regarding the dilated bile duct, I suspect it is probably related to chronic pancreatitis.  Malignancy not excluded.  I am going to have her do a complete abdominal ultrasound and checking a CA 19-9 as well as other labs (see below).  She may need an EUS and possibly an ERCP depending upon what we find out Carvedilol stopped given low blood pressure. Referred to [facility]  Abd [place] on 8/14 >> no focal liver lesions. Small to moderate ascites.Chronic dilated CBD post cholecystectomy. Enlarged spleen.   Patient presented to ED yesterday for evaluation of pain in left flank / left back pain. The pain started on Wed ( she thinks). She slipped in the bathroom but didn';t actually fall. She doesn't think the left back pain is related to near fall. The pain is sharp. It is worse with bending / twisting but hurts when laying still as well. No nausea / vomiting. Bowels moving okay. No blood in stool. Eating and drinking ok.   CT scan with contrast yesterday  showing small gastric varices, moderate ascites, Interval decrease in size of a pancreatic pseudocyst within the tail the pancreas since MRI examination 10/13/2021, however, there is now a septated, rim-enhancing fluid collection within the left posterior pararenal space measuring up to 12.9 cm in greatest dimension. This  is in continuity with the pancreatic pseudocyst and represents retroperitoneal extension of the pancreatic pseudocyst.  General Surgery has evaluated. Seen by Mallory [facility] yesterday. .     Previous GI Evaluation     12/06/20 EGD - Normal esophagus. - Type 1 isolated gastric varices (IGV1, varices located in the fundus), without bleeding. - Portal hypertensive gastropathy. - The examination was otherwise normal. - No specimens collected.   Recent Labs and Imaging 10/13/21 MRI  / MRCP Hepatobiliary: Hepatic cirrhosis is again demonstrated. No hepatic masses identified. Prior cholecystectomy again noted. Diffuse biliary ductal dilatation is seen with common bile duct measuring 20 mm in maximum diameter. Smoothly tapered stricture of the distal common bile duct is seen within the pancreatic head, likely due to chronic pancreatitis. No evidence of choledocholithiasis.   Pancreas: Mild diffuse pancreatic atrophy is seen. No evidence of pancreatic mass or ductal dilatation. A peripancreatic fluid collection with thin enhancing rim is seen adjacent to the pancreatic tail and spleen which measures 6.7 x 4.0 cm, and is consistent with a pancreatic pseudocyst.  Spleen:  Within normal limits in size and appearance.   Adrenals/Urinary Tract: No masses identified. No evidence of hydronephrosis.   Stomach/Bowel: Unremarkable.   Vascular/Lymphatic: No pathologically enlarged lymph nodes identified. No acute vascular findings. Chronic splenic vein thrombosis noted. Recanalization of paraumbilical veins, portosystemic collaterals, and proximal gastric varices, consistent with portal venous hypertension.   Other:  Moderate to large amount of ascites IMPRESSION: 6.7 cm pseudocyst adjacent to the pancreatic tail and spleen. No radiographic signs of acute pancreatitis.   No significant change in diffuse biliary ductal dilatation, with smoothly tapered stricture of the distal common bile duct, likely due to chronic pancreatitis. No evidence of choledocholithiasis.   Hepatic cirrhosis, and signs of portal venous hypertension. No evidence of hepatic neoplasm.   Moderate to large amount of ascites.    CT ABDOMEN PELVIS W CONTRAST  Result Date: 12/24/2021 CLINICAL DATA:  Renal mass/cyst, indeterminate renal fluid collection EXAM: CT ABDOMEN AND PELVIS WITH CONTRAST TECHNIQUE: Multidetector CT imaging of the abdomen and pelvis was performed using the standard protocol  following bolus administration of intravenous contrast. RADIATION DOSE REDUCTION: This exam was performed according to the departmental dose-optimization program which includes automated exposure control, adjustment of the mA and/or kV according to patient size and/or use of iterative reconstruction technique. CONTRAST:  145mL OMNIPAQUE IOHEXOL 300 MG/ML  SOLN COMPARISON:  Noncontrast CT 12/24/2021, MRI 10/13/2021 FINDINGS: Lower chest: Small left pleural effusion with associated left basilar compressive atelectasis. Mild discoid atelectasis within the right lung base. Extensive coronary artery calcification. Global cardiac size within normal limits. Hepatobiliary: Morphologic changes in keeping with cirrhosis. Geographic peripheral areas of enhancement within the liver predominantly within segments 4 and 8 with associated capsular retraction may represent areas of confluent hepatic fibrosis. No definite intrahepatic enhancing mass identified. Stable moderate extrahepatic biliary ductal dilation, tapering within the pancreatic head likely related to changes of chronic pancreatitis. Cholecystectomy has been performed. Pancreas: Mild pancreatic parenchymal atrophy. Intraparenchymal loculated fluid collection within the tail the pancreas in keeping with a pancreatic pseudocyst appears decreased when compared to remote prior MRI examination, measuring 2.7 x 4.2 cm at axial image # 31/2, but is in continuity with the fluid collection seen within the a left posterior pararenal space which represents retroperitoneal extension of the pancreatic pseudocyst. This rim enhancing, septated collection measures 8.0 x 8.9 x 12.9 cm in greatest dimension on axial image # 41 and coronal image # 92. Spleen: Unremarkable  Adrenals/Urinary Tract: The adrenal glands are unremarkable. The kidneys are normal in size and position. Mild mass effect upon the left kidney related to the left posterior pararenal pseudocyst. No hydronephrosis. No  enhancing intrarenal masses. No intrarenal or ureteral calculi. The bladder is decompressed. Stomach/Bowel: Mild sigmoid diverticulosis. Small gastric varices are noted within the gastric fundus. Stomach, small bowel, and large bowel are otherwise unremarkable. Appendix normal. Moderate ascites is present, increased in volume when compared to prior MRI examination. No free intraperitoneal gas. Vascular/Lymphatic: Aortic atherosclerosis. No enlarged abdominal or pelvic lymph nodes. Reproductive: Uterus and bilateral adnexa are unremarkable. Other: No abdominal wall hernia Musculoskeletal: No acute bone abnormality. IMPRESSION: 1. Morphologic changes in keeping with cirrhosis and portal venous hypertension with small gastric varices. Moderate ascites, increased in volume when compared to prior MRI examination. 2. Interval decrease in size of a pancreatic pseudocyst within the tail the pancreas since MRI examination 10/13/2021, however, there is now a septated, rim-enhancing fluid collection within the left posterior pararenal space measuring up to 12.9 cm in greatest dimension. This is in continuity with the pancreatic pseudocyst and represents retroperitoneal extension of the pancreatic pseudocyst. 3. Extensive coronary artery calcification. 4. Mild sigmoid diverticulosis. Aortic Atherosclerosis (ICD10-I70.0). Electronically Signed   By: Fidela Salisbury M.D.   On: 12/24/2021 19:29   CT Renal Stone Study  Result Date: 12/24/2021 CLINICAL DATA:  Left flank swelling for the past 5 days. EXAM: CT ABDOMEN AND PELVIS WITHOUT CONTRAST TECHNIQUE: Multidetector CT imaging of the abdomen and pelvis was performed following the standard protocol without IV contrast. RADIATION DOSE REDUCTION: This exam was performed according to the departmental dose-optimization program which includes automated exposure control, adjustment of the mA and/or kV according to patient size and/or use of iterative reconstruction technique.  COMPARISON:  10/13/2021 MRI FINDINGS: Lower chest: Right hemidiaphragm elevation. Bibasilar atelectasis. Normal heart size with right coronary artery calcification. Trace left pleural fluid. Hepatobiliary: Advanced cirrhosis, with caudate lobe enlargement. Cholecystectomy. Possible stones within the cystic duct remnant on 32/2. Common duct dilatation is suboptimally evaluated but felt to be less impressive than on the prior MRI. Pancreas: Suboptimally evaluated fluid collection adjacent the pancreatic tail measures on the order of 6.3 x 2.6 cm on 35/2 versus 6.2 x 3.8 cm when remeasured in a similar fashion on the prior MRI. Spleen: Normal noncontrast appearance including at 12.1 cm craniocaudal. Adrenals/Urinary Tract: Normal adrenal glands. Normal noncontrast appearance of the right kidney. A new fluid collection within the left perirenal space measures 7.4 x 4.8 cm on 36/2. An adjacent or contiguous more superficial component, extending into the left flank, is also new since the prior MRI at 5.5 x 3.2 cm on 39/2. No hydronephrosis. No bladder calculi. Stomach/Bowel: Proximal gastric underdistention. Extensive colonic diverticulosis. Normal appendix. Normal small bowel caliber. Vascular/Lymphatic: Aortic atherosclerosis. Favor portosystemic collaterals within the omentum indicative of portal venous hypertension. No abdominopelvic adenopathy. Reproductive: Normal uterus and adnexa. Other: Moderate volume abdominopelvic ascites, similar to the prior MRI. No free intraperitoneal air. Anasarca is asymmetric left. Musculoskeletal: No acute osseous abnormality. IMPRESSION: 1. Limited exam secondary to stone study technique 2. Development of left perinephric fluid collection with adjacent component versus satellite collection extending into the left flank. Given clinical history, most likely related to progressive pseudocysts. 3. Relatively similar appearance of a presumed pseudocyst adjacent the pancreatic tail. 4.  Cirrhosis with probable portal venous hypertension and moderate volume ascites. Small left pleural effusion. 5. Age advanced coronary artery atherosclerosis. Recommend assessment of coronary risk factors. 6.  Cholecystectomy with possible stones within the cystic duct remnant. 7.  Aortic Atherosclerosis (ICD10-I70.0). Electronically Signed   By: Abigail Miyamoto M.D.   On: 12/24/2021 15:14   US Abdomen Complete  Result Date: 12/18/2021 CLINICAL DATA:  Cirrhosis. EXAM: ABDOMEN ULTRASOUND COMPLETE COMPARISON:  CT abdomen pelvis 08/22/2020 FINDINGS: Gallbladder: Status post cholecystectomy. Common bile duct: Diameter: Chronic enlargement: 14 mm which tapers to normal caliber distally. Liver: Nodular hepatic contour. No focal lesion identified. Within normal limits in parenchymal echogenicity. Portal vein is patent on color Doppler imaging with normal direction of blood flow towards the liver. IVC: No abnormality visualized. Pancreas: Poorly visualized with cystic lesion along the pancreatic tail measuring up to 1.3 cm. Spleen: Enlarged. Right Kidney: Length: 10 cm. Echogenicity within normal limits. No mass or hydronephrosis visualized. Left Kidney: Length: 12 cm. Echogenicity within normal limits. No mass or hydronephrosis visualized. Abdominal aorta: No aneurysm visualized.  Atherosclerotic plaque. Other findings: At least small to moderate volume simple free fluid ascites. IMPRESSION: 1. Cirrhosis with portal hypertension. No focal liver lesions identified by ultrasound. Please note that liver protocol enhanced MR and CT are the most sensitive tests for the screening detection of hepatocellular carcinoma in the high risk setting of cirrhosis. 2. At least small to moderate volume simple free fluid ascites. 3. Poorly visualized pancreas with query 1.3 cm cystic lesion along the tail. When the patient is clinically stable and able to follow directions and hold their breath (preferably as an outpatient) further  evaluation with dedicated abdominal MRI pancreatic protocol should be considered. 4. Status post cholecystectomy with chronic dilated common bile which can be seen in the post cholecystectomy setting. 5.  Aortic Atherosclerosis (ICD10-I70.0). Electronically Signed   By: Iven Finn M.D.   On: 12/18/2021 19:23        Labs:  Recent Labs    12/24/21 1351 12/25/21 0524  WBC 18.8* 18.0*  HGB 9.8* 8.4*  HCT 29.7* 25.5*  PLT 302 301   Recent Labs    12/24/21 1351 12/25/21 0524  NA 130* 130*  K 4.4 4.5  CL 95* 97*  CO2 26 24  GLUCOSE 175* 166*  BUN 14 18  CREATININE 1.09* 1.33*  CALCIUM 8.8* 8.5*   Recent Labs    12/25/21 0524  PROT 6.0*  ALBUMIN 2.5*  AST 15  ALT 8  ALKPHOS 132*  BILITOT 0.9   No results for input(s): "HEPBSAG", "HCVAB", "HEPAIGM", "HEPBIGM" in the last 72 hours. No results for input(s): "LABPROT", "INR" in the last 72 hours.  Past Medical History:  Diagnosis Date   Alcohol-induced chronic pancreatitis (HCC)    Alcoholic cirrhosis ([place])    B12 deficiency    Concussion    DDD (degenerative disc disease), cervical    Diabetes ([place])    DKA, type 2 ([place])    Gastric outlet obstruction 08/12/2018   Hypertension    Neuropathy    Pancreatic pseudocyst/cyst 05/06/2013   Seasonal allergies     Past Surgical History:  Procedure Laterality Date   ANKLE ARTHROSCOPY Right 12/20/2020   Procedure: RIGHT ANKLE ARTHROSCOPIC DEBRIDEMENT;  Surgeon: Newt Minion, MD;  Location: [place];  Service: Orthopedics;  Laterality: Right;   BIOPSY  01/19/2019   Procedure: BIOPSY;  Surgeon: Rush Landmark Telford Nab., MD;  Location: [place];  Service: Gastroenterology;;   CHOLECYSTECTOMY N/A 07/28/2016   Procedure: LAPAROSCOPIC CHOLECYSTECTOMY WITH INTRAOPERATIVE CHOLANGIOGRAM;  Surgeon: Mickeal Skinner, MD;  Location: [place];  Service: General;  Laterality: N/A;  ESOPHAGOGASTRODUODENOSCOPY (EGD) WITH PROPOFOL N/A 08/13/2018   Procedure:  ESOPHAGOGASTRODUODENOSCOPY (EGD) WITH PROPOFOL;  Surgeon: Rush Landmark Telford Nab., MD;  Location: [place];  Service: Gastroenterology;  Laterality: N/A;   ESOPHAGOGASTRODUODENOSCOPY (EGD) WITH PROPOFOL N/A 01/19/2019   Procedure: ESOPHAGOGASTRODUODENOSCOPY (EGD) WITH PROPOFOL;  Surgeon: Rush Landmark Telford Nab., MD;  Location: [place];  Service: Gastroenterology;  Laterality: N/A;   IR PARACENTESIS  08/08/2020   IR PARACENTESIS  03/13/2021   LEEP  1990's   ORIF FOOT FRACTURE  09/2008   L 5th metatarsal   REFRACTIVE SURGERY  2000   TIBIA IM NAIL INSERTION Left 03/12/2021   Procedure: INTRAMEDULLARY (IM) NAIL TIBIAL;  Surgeon: Jessy Oto, MD;  Location: [place];  Service: Orthopedics;  Laterality: Left;   TONSILLECTOMY     UPPER ESOPHAGEAL ENDOSCOPIC ULTRASOUND (EUS) N/A 08/13/2018   Procedure: UPPER ESOPHAGEAL ENDOSCOPIC ULTRASOUND (EUS);  Surgeon: Irving Copas., MD;  Location: [place];  Service: Gastroenterology;  Laterality: N/A;   UPPER ESOPHAGEAL ENDOSCOPIC ULTRASOUND (EUS) N/A 01/19/2019   Procedure: UPPER ESOPHAGEAL ENDOSCOPIC ULTRASOUND (EUS);  Surgeon: Irving Copas., MD;  Location: [place];  Service: Gastroenterology;  Laterality: N/A;    Family History  Problem Relation Age of Onset   Other Mother        tachycardia.Marland KitchenMarland Kitchen?afib   Stroke Father        after hernia suegery   Prostate cancer Father    Other Father        global transient amnesia, unclear source   Atrial fibrillation Sister    Healthy Brother    Healthy Brother    Coronary artery disease Paternal Grandmother    Heart attack Paternal Grandmother 2   Brain cancer Maternal Grandfather        ?   Cancer Paternal Grandfather        ?   Breast cancer Maternal Grandmother     Prior to Admission medications   Medication Sig Start Date End Date Taking? Authorizing Provider  amitriptyline (ELAVIL) 75 MG tablet Take 1 tablet (75 mg total) by mouth at bedtime. 03/24/21   Mallory Sanders,  MD  aspirin EC 81 MG EC tablet Take 1 tablet (81 mg total) by mouth 2 (two) times daily. Swallow whole. 03/17/21   Jessy Oto, MD  Blood Glucose Monitoring Suppl (ONE TOUCH ULTRA 2) w/Device KIT Use to check blood sugars two times day 08/01/18   Mallory Sanders, MD  cetirizine (ZYRTEC) 10 MG tablet Take 10 mg by mouth at bedtime.    [provider]  FEROSUL 325 (65 Fe) MG tablet TAKE 1 TABLET(325 MG) BY MOUTH TWICE DAILY WITH A MEAL 07/19/21   Jessy Oto, MD  furosemide (LASIX) 20 MG tablet Take 1 tablet (20 mg total) by mouth daily. Patient taking differently: Take 20 mg by mouth every other day. 07/31/21   Gatha Mayer, MD  glucose blood (ONETOUCH ULTRA) test strip CHECK BLOOD SUGAR TWICE DAILY AS DIRECTED 12/13/21   Bedsole, Amy E, MD  Insulin Glargine (BASAGLAR KWIKPEN) 100 UNIT/ML Inject 36 Units into the skin daily. 11/20/21   Shamleffer, Melanie Crazier, MD  insulin lispro (HUMALOG KWIKPEN) 100 UNIT/ML KwikPen Max Daily 30 units 11/23/21   Shamleffer, Melanie Crazier, MD  Insulin Pen Needle 32G X 4 MM MISC 1 Device by Does not apply route in the morning, at noon, in the evening, and at bedtime. 11/20/21   Shamleffer, Melanie Crazier, MD  Lancets [facility] ULTRASOFT) lancets Use to check blood sugar  two times a day. 08/01/18   Mallory Sanders, MD  meclizine (ANTIVERT) 25 MG tablet Take 1/2 tablet po qhs prn vertigo 11/23/21   Eugenia Pancoast, FNP  Multiple Vitamin (MULTIVITAMIN WITH MINERALS) TABS tablet Take 1 tablet by mouth daily. 07/25/18   Mikhail, Velta Addison, DO  pantoprazole (PROTONIX) 40 MG tablet TAKE 1 TABLET BY MOUTH TWICE DAILY BEFORE A MEAL 10/19/21   Gatha Mayer, MD    Current Facility-Administered Medications  Medication Dose Route Frequency Provider Last Rate Last Admin   HYDROmorphone (DILAUDID) injection 0.5-1 mg  0.5-1 mg Intravenous Q3H PRN Winferd Humphrey, PA-C       insulin aspart (novoLOG) injection 0-9 Units  0-9 Units Subcutaneous Q4H Shela Leff, MD    1 Units at 12/25/21 0825   oxyCODONE (Oxy IR/ROXICODONE) immediate release tablet 5-10 mg  5-10 mg Oral Q4H PRN Winferd Humphrey, PA-C        Allergies as of 12/24/2021 - Review Complete 12/24/2021  Allergen Reaction Noted   Augmentin [amoxicillin-pot clavulanate] Dermatitis 11/28/2021   Gluten meal Other (See Comments) 07/22/2018   Zocor [simvastatin] Other (See Comments) 04/06/2011    Social History   Socioeconomic History   Marital status: Married    Spouse name: Not on file   Number of children: 1   Years of education: 16   Highest education level: Not on file  Occupational History   Occupation: Mudlogger of business development  Tobacco Use   Smoking status: Never   Smokeless tobacco: Never  Vaping Use   Vaping Use: Never used  Substance and Sexual Activity   Alcohol use: Not Currently    Alcohol/week: 7.0 standard drinks of alcohol    Types: 7 Glasses of wine per week    Comment: wine   Drug use: No   Sexual activity: Not on file  Other Topics Concern   Not on file  Social History Narrative   Patient is married one stepson    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 not traveling   regular exercise , walking 4-5 times a week   Diet fruits and veggies, water   Alcohol at least several glasses of wine a week Stopped 08/2018   Never smoker, no drug use   Right handed   One story home   [place] in the am   Social Determinants of Health   Financial Resource Strain: Not on file  Food Insecurity: Not on file  Transportation Needs: No Transportation Needs (04/25/2021)   PRAPARE - Hydrologist (Medical): No    Lack of Transportation (Non-Medical): No  Physical Activity: Not on file  Stress: Not on file  Social Connections: Not on file  Intimate Partner Violence: Not on file    Review of Systems: All systems reviewed and negative except where noted in HPI.  Physical  Exam: Vital signs in last 24 hours: Temp:  [98 F (36.7 C)-99.3 F (37.4 C)] 99.1 F (37.3 C) (08/21 6389) Pulse Rate:  [87-110] 98 (08/21 0614) Resp:  [16-19] 18 (08/21 0614) BP: (104-153)/(65-99) 104/65 (08/21 0614) SpO2:  [96 %-100 %] 97 % (08/21 0614) Weight:  [65.8 kg] 65.8 kg (08/20 1235)    General:  Alert female in NAD Psych:  Pleasant, cooperative. Normal mood and affect Eyes: Pupils equal, no icterus. Conjunctive pink Ears:  Normal auditory acuity Nose: No deformity, discharge or lesions Neck:  Supple, no masses  felt Lungs:  Clear to auscultation.  Heart:  Regular rate, regular rhythm. No lower extremity edema Abdomen:  Soft, nondistended, nontender, active bowel sounds, no masses felt Rectal :  Deferred Msk: Symmetrical without gross deformities.  Neurologic:  Alert, oriented, grossly normal neurologically Skin:  Intact without significant lesions.  Musculoskeletal: Left flank / left mid back non tender   Intake/Output from previous day: No intake/output data recorded. Intake/Output this shift:  No intake/output data recorded.    Principal Problem:   Pancreatic pseudocyst Active Problems:   Hypertension associated with diabetes ([place])   Chronic hyponatremia   Alcoholic cirrhosis of liver with ascites ([place])   SIRS (systemic inflammatory response syndrome) ([place])    Tye Savoy, NP-C @  12/25/2021, 10:30 AM

## 2021-12-25 NOTE — Progress Notes (Signed)
[facility]  PROGRESS NOTE  Mallory Stewart LGX:[id number] DOB: 08-18-1960 DOA: 12/24/2021 PCP: Excell Seltzer, MD   Brief HPI:   61 year old female with medical history of diabetes mellitus type 2, hypertension, chronic hyponatremia, alcoholic cirrhosis, chronic pancreatitis, pancreatic pseudocyst, GERD, CKD stage II-III came to ED complaining of left flank pain and swelling.  CT showing interval decrease in size of a pancreatic pseudocyst within the tail of the pancreas since MRI examination 10/13/2021, however, there is now a septated, rim-enhancing fluid collection within the left posterior pararenal space measuring up to 12.9 cm in greatest dimension.  This is in continuity with the pancreatic pseudocyst and represents retroperitoneal extension of the pancreatic pseudocyst. General surgery consulted.    Subjective   Patient seen and examined, still complains of left flank pain.   Assessment/Plan:   Enlarging pancreatic pseudocyst -CT showing interval decrease in size of a pancreatic pseudocyst within the tail of the pancreas since MRI examination 10/13/2021, however, there is now a septated, rim-enhancing fluid collection within the left posterior pararenal space measuring up to 12.9 cm in greatest dimension.  This is in continuity with the pancreatic pseudocyst and represents retroperitoneal extension of the pancreatic pseudocyst.  General surgery feels that she has a stricture of the main pancreatic duct in the head of the pancreas leading to a blowout in the tail with subsequent pseudocyst formation -Pseudocyst will likely require drainage however general surgery does not feel that patient is a surgical candidate given history of liver cirrhosis and portal hypertension -Gastroenterology has been consulted for further management for enlarging pancreatic pseudocyst  Alcoholic cirrhosis with ascites -CT abdomen pelvis done yesterday only showed moderate volume ascites -No fever,  SBP felt less likely -GI following  Chronic hyponatremia -Patient has stable mild hyponatremia due to liver cirrhosis -Sodium is stable at 130  CKD stage III a -Creatinine is stable   Medications     insulin aspart  0-9 Units Subcutaneous Q4H     Data Reviewed:   CBG:  Recent Labs  Lab 12/24/21 2319 12/25/21 0311 12/25/21 0748 12/25/21 1131  GLUCAP 128* 168* 149* 130*    SpO2: 100 %    Vitals:   12/25/21 0315 12/25/21 0614 12/25/21 1119 12/25/21 1406  BP: 109/69 104/65 107/72 107/80  Pulse: (!) 105 98 98 97  Resp: 19 18 16    Temp: 99.3 F (37.4 C) 99.1 F (37.3 C) 98.4 F (36.9 C) 98.8 F (37.1 C)  TempSrc:   Oral Oral  SpO2: 99% 97% 98% 100%  Weight:      Height:          Data Reviewed:  Basic Metabolic Panel: Recent Labs  Lab 12/24/21 1351 12/25/21 0524  NA 130* 130*  K 4.4 4.5  CL 95* 97*  CO2 26 24  GLUCOSE 175* 166*  BUN 14 18  CREATININE 1.09* 1.33*  CALCIUM 8.8* 8.5*    CBC: Recent Labs  Lab 12/24/21 1351 12/25/21 0524  WBC 18.8* 18.0*  NEUTROABS 16.1*  --   HGB 9.8* 8.4*  HCT 29.7* 25.5*  MCV 89.5 89.5  PLT 302 301    LFT Recent Labs  Lab 12/24/21 1351 12/25/21 0524  AST 19 15  ALT 9 8  ALKPHOS 139* 132*  BILITOT 1.4* 0.9  PROT 7.0 6.0*  ALBUMIN 2.9* 2.5*     Antibiotics: Anti-infectives (From admission, onward)    None        DVT prophylaxis: SCDs  Code Status: Full code  Family Communication: No family at bedside   CONSULTS gastroenterology, general surgery   Objective    Physical Examination:   General-appears in no acute distress Heart-S1-S2, regular, no murmur auscultated Lungs-clear to auscultation bilaterally, no wheezing or crackles auscultated Abdomen-soft, tenderness in left flank region, no organomegaly Extremities-no edema in the lower extremities Neuro-alert, oriented x3, no focal deficit noted   Status is: Inpatient:             Meredeth Ide   [facility] If 7PM-7AM, please contact night-coverage at [email or web address], Office  [phone]   12/25/2021, 4:40 PM  LOS: 1 day

## 2021-12-25 NOTE — Progress Notes (Signed)
   12/25/21 1339  Mobility  Activity Refused mobility   Mobility Specialist Cancellation/Refusal Note:   Reason for Cancellation/Refusal: Pt declined mobility at this time. Pt wanted to take meds before ambulation. Will check back as schedule permits.    [facility]

## 2021-12-25 NOTE — Progress Notes (Signed)
Pt arrived to unit via stretcher  room 1513.alert and oriented x4. Oriented to room and callbell with no complications. Initial assessment and 2 RN assessment completed. Pain 6/10.will continue to monitor

## 2021-12-25 NOTE — Plan of Care (Signed)

## 2021-12-25 NOTE — Progress Notes (Signed)
Progress Note     Subjective: Did not have any nausea/emesis/abdominal pain with eating yesterday pm. NPO since MN. Continued flank pain this am minimally improved with iv pain med. She feels "bloated" due to ascites fluid   Objective: Vital signs in last 24 hours: Temp:  [98 F (36.7 C)-99.3 F (37.4 C)] 99.1 F (37.3 C) (08/21 0614) Pulse Rate:  [87-110] 98 (08/21 0614) Resp:  [16-19] 18 (08/21 0614) BP: (104-153)/(65-99) 104/65 (08/21 0614) SpO2:  [96 %-100 %] 97 % (08/21 0614) Weight:  [65.8 kg] 65.8 kg (08/20 1235)    Intake/Output from previous day: No intake/output data recorded. Intake/Output this shift: No intake/output data recorded.  PE: General: pleasant, WD, female who is laying in bed in NAD HEENT: head is normocephalic, atraumatic.  Sclera are noninjected.  Pupils equal and round. EOMs intact.  Ears and nose without any masses or lesions.  Mouth is pink and moist Lungs: Respiratory effort nonlabored Abd: soft, NT, ND, +BS Left flank and back swollen with TTP MSK: all 4 extremities are symmetrical with no cyanosis, clubbing, or edema. Skin: warm and dry Psych: A&Ox3 with an appropriate affect.    Lab Results:  Recent Labs    12/24/21 1351 12/25/21 0524  WBC 18.8* 18.0*  HGB 9.8* 8.4*  HCT 29.7* 25.5*  PLT 302 301   BMET Recent Labs    12/24/21 1351 12/25/21 0524  NA 130* 130*  K 4.4 4.5  CL 95* 97*  CO2 26 24  GLUCOSE 175* 166*  BUN 14 18  CREATININE 1.09* 1.33*  CALCIUM 8.8* 8.5*   PT/INR No results for input(s): "LABPROT", "INR" in the last 72 hours. CMP     Component Value Date/Time   NA 130 (L) 12/25/2021 0524   NA 136 04/09/2013 0451   K 4.5 12/25/2021 0524   K 4.1 04/09/2013 0451   CL 97 (L) 12/25/2021 0524   CL 103 04/09/2013 0451   CO2 24 12/25/2021 0524   CO2 24 04/09/2013 0451   GLUCOSE 166 (H) 12/25/2021 0524   GLUCOSE 154 (H) 04/09/2013 0451   BUN 18 12/25/2021 0524   BUN 10 04/09/2013 0451   CREATININE 1.33  (H) 12/25/2021 0524   CREATININE 1.13 04/09/2013 0451   CALCIUM 8.5 (L) 12/25/2021 0524   CALCIUM 6.7 (LL) 04/09/2013 0451   PROT 6.0 (L) 12/25/2021 0524   PROT 5.3 (L) 04/09/2013 0451   ALBUMIN 2.5 (L) 12/25/2021 0524   ALBUMIN 2.5 (L) 04/09/2013 0451   AST 15 12/25/2021 0524   AST 62 (H) 04/09/2013 0451   ALT 8 12/25/2021 0524   ALT 82 (H) 04/09/2013 0451   ALKPHOS 132 (H) 12/25/2021 0524   ALKPHOS 46 04/09/2013 0451   BILITOT 0.9 12/25/2021 0524   BILITOT 1.0 04/09/2013 0451   GFRNONAA 46 (L) 12/25/2021 0524   GFRNONAA 57 (L) 04/09/2013 0451   GFRAA 54 (L) 10/28/2018 1629   GFRAA >60 04/09/2013 0451   Lipase     Component Value Date/Time   LIPASE 22 12/24/2021 1351   LIPASE 3,352 (H) 04/09/2013 0451       Studies/Results: CT ABDOMEN PELVIS W CONTRAST  Result Date: 12/24/2021 CLINICAL DATA:  Renal mass/cyst, indeterminate renal fluid collection EXAM: CT ABDOMEN AND PELVIS WITH CONTRAST TECHNIQUE: Multidetector CT imaging of the abdomen and pelvis was performed using the standard protocol following bolus administration of intravenous contrast. RADIATION DOSE REDUCTION: This exam was performed according to the departmental dose-optimization program which includes automated exposure control, adjustment  of the mA and/or kV according to patient size and/or use of iterative reconstruction technique. CONTRAST:  OMNIPAQUE IOHEXOL 300 MG/ML  SOLN COMPARISON:  Noncontrast CT 12/24/2021, MRI 10/13/2021 FINDINGS: Lower chest: Small left pleural effusion with associated left basilar compressive atelectasis. Mild discoid atelectasis within the right lung base. Extensive coronary artery calcification. Global cardiac size within normal limits. Hepatobiliary: Morphologic changes in keeping with cirrhosis. Geographic peripheral areas of enhancement within the liver predominantly within segments 4 and 8 with associated capsular retraction may represent areas of confluent hepatic fibrosis. No  definite intrahepatic enhancing mass identified. Stable moderate extrahepatic biliary ductal dilation, tapering within the pancreatic head likely related to changes of chronic pancreatitis. Cholecystectomy has been performed. Pancreas: Mild pancreatic parenchymal atrophy. Intraparenchymal loculated fluid collection within the tail the pancreas in keeping with a pancreatic pseudocyst appears decreased when compared to remote prior MRI examination, measuring 2.7 x 4.2 cm at axial image # 31/2, but is in continuity with the fluid collection seen within the a left posterior pararenal space which represents retroperitoneal extension of the pancreatic pseudocyst. This rim enhancing, septated collection measures 8.0 x 8.9 x 12.9 cm in greatest dimension on axial image # 41 and coronal image # 92. Spleen: Unremarkable Adrenals/Urinary Tract: The adrenal glands are unremarkable. The kidneys are normal in size and position. Mild mass effect upon the left kidney related to the left posterior pararenal pseudocyst. No hydronephrosis. No enhancing intrarenal masses. No intrarenal or ureteral calculi. The bladder is decompressed. Stomach/Bowel: Mild sigmoid diverticulosis. Small gastric varices are noted within the gastric fundus. Stomach, small bowel, and large bowel are otherwise unremarkable. Appendix normal. Moderate ascites is present, increased in volume when compared to prior MRI examination. No free intraperitoneal gas. Vascular/Lymphatic: Aortic atherosclerosis. No enlarged abdominal or pelvic lymph nodes. Reproductive: Uterus and bilateral adnexa are unremarkable. Other: No abdominal wall hernia Musculoskeletal: No acute bone abnormality. IMPRESSION: 1. Morphologic changes in keeping with cirrhosis and portal venous hypertension with small gastric varices. Moderate ascites, increased in volume when compared to prior MRI examination. 2. Interval decrease in size of a pancreatic pseudocyst within the tail the pancreas  since MRI examination 10/13/2021, however, there is now a septated, rim-enhancing fluid collection within the left posterior pararenal space measuring up to 12.9 cm in greatest dimension. This is in continuity with the pancreatic pseudocyst and represents retroperitoneal extension of the pancreatic pseudocyst. 3. Extensive coronary artery calcification. 4. Mild sigmoid diverticulosis. Aortic Atherosclerosis (ICD10-I70.0). Electronically Signed   By: Helyn Numbers M.D.   On: 12/24/2021 19:29   CT Renal Stone Study  Result Date: 12/24/2021 CLINICAL DATA:  Left flank swelling for the past 5 days. EXAM: CT ABDOMEN AND PELVIS WITHOUT CONTRAST TECHNIQUE: Multidetector CT imaging of the abdomen and pelvis was performed following the standard protocol without IV contrast. RADIATION DOSE REDUCTION: This exam was performed according to the departmental dose-optimization program which includes automated exposure control, adjustment of the mA and/or kV according to patient size and/or use of iterative reconstruction technique. COMPARISON:  10/13/2021 MRI FINDINGS: Lower chest: Right hemidiaphragm elevation. Bibasilar atelectasis. Normal heart size with right coronary artery calcification. Trace left pleural fluid. Hepatobiliary: Advanced cirrhosis, with caudate lobe enlargement. Cholecystectomy. Possible stones within the cystic duct remnant on 32/2. Common duct dilatation is suboptimally evaluated but felt to be less impressive than on the prior MRI. Pancreas: Suboptimally evaluated fluid collection adjacent the pancreatic tail measures on the order of 6.3 x 2.6 cm on 35/2 versus 6.2 x 3.8 cm when  remeasured in a similar fashion on the prior MRI. Spleen: Normal noncontrast appearance including at 12.1 cm craniocaudal. Adrenals/Urinary Tract: Normal adrenal glands. Normal noncontrast appearance of the right kidney. A new fluid collection within the left perirenal space measures 7.4 x 4.8 cm on 36/2. An adjacent or  contiguous more superficial component, extending into the left flank, is also new since the prior MRI at 5.5 x 3.2 cm on 39/2. No hydronephrosis. No bladder calculi. Stomach/Bowel: Proximal gastric underdistention. Extensive colonic diverticulosis. Normal appendix. Normal small bowel caliber. Vascular/Lymphatic: Aortic atherosclerosis. Favor portosystemic collaterals within the omentum indicative of portal venous hypertension. No abdominopelvic adenopathy. Reproductive: Normal uterus and adnexa. Other: Moderate volume abdominopelvic ascites, similar to the prior MRI. No free intraperitoneal air. Anasarca is asymmetric left. Musculoskeletal: No acute osseous abnormality. IMPRESSION: 1. Limited exam secondary to stone study technique 2. Development of left perinephric fluid collection with adjacent component versus satellite collection extending into the left flank. Given clinical history, most likely related to progressive pseudocysts. 3. Relatively similar appearance of a presumed pseudocyst adjacent the pancreatic tail. 4. Cirrhosis with probable portal venous hypertension and moderate volume ascites. Small left pleural effusion. 5. Age advanced coronary artery atherosclerosis. Recommend assessment of coronary risk factors. 6. Cholecystectomy with possible stones within the cystic duct remnant. 7.  Aortic Atherosclerosis (ICD10-I70.0). Electronically Signed   By: Jeronimo Greaves M.D.   On: 12/24/2021 15:14    Anti-infectives: Anti-infectives (From admission, onward)    None        Assessment/Plan Pancreatic pseudocyst - Child Pugh class B, MELD 19. Not a candidate for surgical intervention due to cirrhosis with portal hypertension - enlarging pseudocyst with narrowing of CBD - question stricture of main PD in the head of the pancreas. GI consulted for possibility of ERCP. She will likely need endoscopic vs percutaneous drainage of pseudocyst - poor pain control - adjusted pain meds this am  FEN: NPO  awaiting GI eval ID: none VTE: none  I reviewed hospitalist notes, last 24 h vitals and pain scores, last 48 h intake and output, last 24 h labs and trends, and last 24 h imaging results.  This care required moderate level of medical decision making.    LOS: 1 day   Eric Form, [facility] 12/25/2021, 8:35 AM Please see Amion for pager number during day hours 7:00am-4:30pm

## 2021-12-25 NOTE — Progress Notes (Signed)
Critical lab lactic acid 2.9. attending notified @2349  Ouma, NP.  Writer notified No new orders at this time.

## 2021-12-25 NOTE — Progress Notes (Signed)
   12/25/21 2010  Assess: MEWS Score  Temp (!) 100.6 F (38.1 C) (NURSE NOTIFIED)  BP 121/77  MAP (mmHg) 91  Pulse Rate (!) 110  Resp 20  Level of Consciousness Alert  SpO2 98 %  Assess: MEWS Score  MEWS Temp 1  MEWS Systolic 0  MEWS Pulse 1  MEWS RR 0  MEWS LOC 0  MEWS Score 2  MEWS Score Color Yellow  Assess: if the MEWS score is Yellow or Red  Were vital signs taken at a resting state? Yes  Focused Assessment No change from prior assessment  Does the patient meet 2 or more of the SIRS criteria? Yes  Does the patient have a confirmed or suspected source of infection? Yes  Provider and Rapid Response Notified? No  MEWS guidelines implemented *See Row Information* Yes  Take Vital Signs  Increase Vital Sign Frequency  Yellow: Q 2hr X 2 then Q 4hr X 2, if remains yellow, continue Q 4hrs  Escalate  MEWS: Escalate Yellow: discuss with charge nurse/RN and consider discussing with provider and RRT  Notify: Charge Nurse/RN  Name of Charge Nurse/RN Notified Nurse, children's  Date Charge Nurse/RN Notified 12/25/21  Notify: Provider  Provider Name/Title Ouma NP  Date Provider Notified 12/25/21  Method of Notification  (secure chat)  Provider response No new orders  Document  Patient Outcome  (Remains on unit)  Assess: SIRS CRITERIA  SIRS Temperature  0  SIRS Pulse 1  SIRS Respirations  0  SIRS WBC 1  SIRS Score Sum  2   On call provider notified. Due to patient's liver & renal insufficiency provider would like to hold off on pharmacological measures at this time. Temperature in the room adjusted, pt provided with ice packs. Will continue to closely monitor.

## 2021-12-26 ENCOUNTER — Inpatient Hospital Stay (HOSPITAL_COMMUNITY): Payer: 59

## 2021-12-26 DIAGNOSIS — R109 Unspecified abdominal pain: Secondary | ICD-10-CM | POA: Diagnosis not present

## 2021-12-26 DIAGNOSIS — E871 Hypo-osmolality and hyponatremia: Secondary | ICD-10-CM | POA: Diagnosis not present

## 2021-12-26 DIAGNOSIS — K7031 Alcoholic cirrhosis of liver with ascites: Secondary | ICD-10-CM | POA: Diagnosis not present

## 2021-12-26 DIAGNOSIS — K863 Pseudocyst of pancreas: Secondary | ICD-10-CM | POA: Diagnosis not present

## 2021-12-26 LAB — BODY FLUID CELL COUNT WITH DIFFERENTIAL
Eos, Fluid: 0 %
Lymphs, Fluid: 19 %
Monocyte-Macrophage-Serous Fluid: 43 % — ABNORMAL LOW (ref 50–90)
Neutrophil Count, Fluid: 38 % — ABNORMAL HIGH (ref 0–25)
Total Nucleated Cell Count, Fluid: 517 cu mm (ref 0–1000)

## 2021-12-26 LAB — GLUCOSE, CAPILLARY
Glucose-Capillary: 104 mg/dL — ABNORMAL HIGH (ref 70–99)
Glucose-Capillary: 114 mg/dL — ABNORMAL HIGH (ref 70–99)
Glucose-Capillary: 161 mg/dL — ABNORMAL HIGH (ref 70–99)
Glucose-Capillary: 188 mg/dL — ABNORMAL HIGH (ref 70–99)
Glucose-Capillary: 71 mg/dL (ref 70–99)
Glucose-Capillary: 93 mg/dL (ref 70–99)

## 2021-12-26 LAB — ALBUMIN, PLEURAL OR PERITONEAL FLUID: Albumin, Fluid: <1.5 g/dL

## 2021-12-26 LAB — COMPREHENSIVE METABOLIC PANEL
ALT: 7 U/L (ref 0–44)
AST: 14 U/L — ABNORMAL LOW (ref 15–41)
Albumin: 2.4 g/dL — ABNORMAL LOW (ref 3.5–5.0)
Alkaline Phosphatase: 112 U/L (ref 38–126)
Anion gap: 7 (ref 5–15)
BUN: 14 mg/dL (ref 6–20)
CO2: 25 mmol/L (ref 22–32)
Calcium: 8.7 mg/dL — ABNORMAL LOW (ref 8.9–10.3)
Chloride: 99 mmol/L (ref 98–111)
Creatinine, Ser: 1.06 mg/dL — ABNORMAL HIGH (ref 0.44–1.00)
GFR, Estimated: >60 mL/min (ref >60)
Glucose, Bld: 93 mg/dL (ref 70–99)
Potassium: 4 mmol/L (ref 3.5–5.1)
Sodium: 131 mmol/L — ABNORMAL LOW (ref 135–145)
Total Bilirubin: 1.3 mg/dL — ABNORMAL HIGH (ref 0.3–1.2)
Total Protein: 5.9 g/dL — ABNORMAL LOW (ref 6.5–8.1)

## 2021-12-26 LAB — CBC
HCT: 26.7 % — ABNORMAL LOW (ref 36.0–46.0)
Hemoglobin: 8.7 g/dL — ABNORMAL LOW (ref 12.0–15.0)
MCH: 29.3 pg (ref 26.0–34.0)
MCHC: 32.6 g/dL (ref 30.0–36.0)
MCV: 89.9 fL (ref 80.0–100.0)
Platelets: 269 10*3/uL (ref 150–400)
RBC: 2.97 MIL/uL — ABNORMAL LOW (ref 3.87–5.11)
RDW: 13.8 % (ref 11.5–15.5)
WBC: 16 10*3/uL — ABNORMAL HIGH (ref 4.0–10.5)
nRBC: 0 % (ref 0.0–0.2)

## 2021-12-26 LAB — PROTEIN, PLEURAL OR PERITONEAL FLUID: Total protein, fluid: <3 g/dL

## 2021-12-26 MED ORDER — LIDOCAINE HCL 1 % IJ SOLN
INTRAMUSCULAR | Status: AC
  Administered 2021-12-26: 15 mL
  Filled 2021-12-26: qty 20

## 2021-12-26 NOTE — Progress Notes (Signed)
Progress Note     Subjective: No acute changes.  Continued left flank pain.  She used Dilaudid for this yesterday but did not like how it made her feel cognitively and is trying to avoid it today.  Just took some oxycodone to see if this will help with the pain.  No nausea emesis or abdominal pain.   Objective: Vital signs in last 24 hours: Temp:  [98.4 F (36.9 C)-100.6 F (38.1 C)] 99.5 F (37.5 C) (08/22 0742) Pulse Rate:  [94-110] 100 (08/22 0742) Resp:  [16-20] 18 (08/22 0742) BP: (104-121)/(59-80) 104/69 (08/22 0742) SpO2:  [94 %-100 %] 99 % (08/22 0742) Last BM Date : 12/24/21 (per pt)  Intake/Output from previous day: 08/21 0701 - 08/22 0700 In: 240 [P.O.:240] Out: -  Intake/Output this shift: No intake/output data recorded.  PE: General: pleasant, WD, female who is laying in bed in NAD Lungs: Respiratory effort nonlabored Abd: soft, NT, ND, +BS Left flank and back swollen with TTP MSK: all 4 extremities are symmetrical with no cyanosis, clubbing, or edema. Skin: warm and dry Psych: A&Ox3 with an appropriate affect.    Lab Results:  Recent Labs    12/25/21 0524 12/26/21 0535  WBC 18.0* 16.0*  HGB 8.4* 8.7*  HCT 25.5* 26.7*  PLT 301 269    BMET Recent Labs    12/25/21 0524 12/26/21 0535  NA 130* 131*  K 4.5 4.0  CL 97* 99  CO2 24 25  GLUCOSE 166* 93  BUN 18 14  CREATININE 1.33* 1.06*  CALCIUM 8.5* 8.7*    PT/INR No results for input(s): "LABPROT", "INR" in the last 72 hours. CMP     Component Value Date/Time   NA 131 (L) 12/26/2021 0535   NA 136 04/09/2013 0451   K 4.0 12/26/2021 0535   K 4.1 04/09/2013 0451   CL 99 12/26/2021 0535   CL 103 04/09/2013 0451   CO2 25 12/26/2021 0535   CO2 24 04/09/2013 0451   GLUCOSE 93 12/26/2021 0535   GLUCOSE 154 (H) 04/09/2013 0451   BUN 14 12/26/2021 0535   BUN 10 04/09/2013 0451   CREATININE 1.06 (H) 12/26/2021 0535   CREATININE 1.13 04/09/2013 0451   CALCIUM 8.7 (L) 12/26/2021 0535    CALCIUM 6.7 (LL) 04/09/2013 0451   PROT 5.9 (L) 12/26/2021 0535   PROT 5.3 (L) 04/09/2013 0451   ALBUMIN 2.4 (L) 12/26/2021 0535   ALBUMIN 2.5 (L) 04/09/2013 0451   AST 14 (L) 12/26/2021 0535   AST 62 (H) 04/09/2013 0451   ALT 7 12/26/2021 0535   ALT 82 (H) 04/09/2013 0451   ALKPHOS 112 12/26/2021 0535   ALKPHOS 46 04/09/2013 0451   BILITOT 1.3 (H) 12/26/2021 0535   BILITOT 1.0 04/09/2013 0451   GFRNONAA >60 12/26/2021 0535   GFRNONAA 57 (L) 04/09/2013 0451   GFRAA 54 (L) 10/28/2018 1629   GFRAA >60 04/09/2013 0451   Lipase     Component Value Date/Time   LIPASE 22 12/24/2021 1351   LIPASE 3,352 (H) 04/09/2013 0451       Studies/Results: CT ABDOMEN PELVIS W CONTRAST  Result Date: 12/24/2021 CLINICAL DATA:  Renal mass/cyst, indeterminate renal fluid collection EXAM: CT ABDOMEN AND PELVIS WITH CONTRAST TECHNIQUE: Multidetector CT imaging of the abdomen and pelvis was performed using the standard protocol following bolus administration of intravenous contrast. RADIATION DOSE REDUCTION: This exam was performed according to the departmental dose-optimization program which includes automated exposure control, adjustment of the mA and/or kV  according to patient size and/or use of iterative reconstruction technique. CONTRAST:  OMNIPAQUE IOHEXOL 300 MG/ML  SOLN COMPARISON:  Noncontrast CT 12/24/2021, MRI 10/13/2021 FINDINGS: Lower chest: Small left pleural effusion with associated left basilar compressive atelectasis. Mild discoid atelectasis within the right lung base. Extensive coronary artery calcification. Global cardiac size within normal limits. Hepatobiliary: Morphologic changes in keeping with cirrhosis. Geographic peripheral areas of enhancement within the liver predominantly within segments 4 and 8 with associated capsular retraction may represent areas of confluent hepatic fibrosis. No definite intrahepatic enhancing mass identified. Stable moderate extrahepatic biliary ductal  dilation, tapering within the pancreatic head likely related to changes of chronic pancreatitis. Cholecystectomy has been performed. Pancreas: Mild pancreatic parenchymal atrophy. Intraparenchymal loculated fluid collection within the tail the pancreas in keeping with a pancreatic pseudocyst appears decreased when compared to remote prior MRI examination, measuring 2.7 x 4.2 cm at axial image # 31/2, but is in continuity with the fluid collection seen within the a left posterior pararenal space which represents retroperitoneal extension of the pancreatic pseudocyst. This rim enhancing, septated collection measures 8.0 x 8.9 x 12.9 cm in greatest dimension on axial image # 41 and coronal image # 92. Spleen: Unremarkable Adrenals/Urinary Tract: The adrenal glands are unremarkable. The kidneys are normal in size and position. Mild mass effect upon the left kidney related to the left posterior pararenal pseudocyst. No hydronephrosis. No enhancing intrarenal masses. No intrarenal or ureteral calculi. The bladder is decompressed. Stomach/Bowel: Mild sigmoid diverticulosis. Small gastric varices are noted within the gastric fundus. Stomach, small bowel, and large bowel are otherwise unremarkable. Appendix normal. Moderate ascites is present, increased in volume when compared to prior MRI examination. No free intraperitoneal gas. Vascular/Lymphatic: Aortic atherosclerosis. No enlarged abdominal or pelvic lymph nodes. Reproductive: Uterus and bilateral adnexa are unremarkable. Other: No abdominal wall hernia Musculoskeletal: No acute bone abnormality. IMPRESSION: 1. Morphologic changes in keeping with cirrhosis and portal venous hypertension with small gastric varices. Moderate ascites, increased in volume when compared to prior MRI examination. 2. Interval decrease in size of a pancreatic pseudocyst within the tail the pancreas since MRI examination 10/13/2021, however, there is now a septated, rim-enhancing fluid  collection within the left posterior pararenal space measuring up to 12.9 cm in greatest dimension. This is in continuity with the pancreatic pseudocyst and represents retroperitoneal extension of the pancreatic pseudocyst. 3. Extensive coronary artery calcification. 4. Mild sigmoid diverticulosis. Aortic Atherosclerosis (ICD10-I70.0). Electronically Signed   By: Helyn Numbers M.D.   On: 12/24/2021 19:29   CT Renal Stone Study  Result Date: 12/24/2021 CLINICAL DATA:  Left flank swelling for the past 5 days. EXAM: CT ABDOMEN AND PELVIS WITHOUT CONTRAST TECHNIQUE: Multidetector CT imaging of the abdomen and pelvis was performed following the standard protocol without IV contrast. RADIATION DOSE REDUCTION: This exam was performed according to the departmental dose-optimization program which includes automated exposure control, adjustment of the mA and/or kV according to patient size and/or use of iterative reconstruction technique. COMPARISON:  10/13/2021 MRI FINDINGS: Lower chest: Right hemidiaphragm elevation. Bibasilar atelectasis. Normal heart size with right coronary artery calcification. Trace left pleural fluid. Hepatobiliary: Advanced cirrhosis, with caudate lobe enlargement. Cholecystectomy. Possible stones within the cystic duct remnant on 32/2. Common duct dilatation is suboptimally evaluated but felt to be less impressive than on the prior MRI. Pancreas: Suboptimally evaluated fluid collection adjacent the pancreatic tail measures on the order of 6.3 x 2.6 cm on 35/2 versus 6.2 x 3.8 cm when remeasured in a similar fashion  on the prior MRI. Spleen: Normal noncontrast appearance including at 12.1 cm craniocaudal. Adrenals/Urinary Tract: Normal adrenal glands. Normal noncontrast appearance of the right kidney. A new fluid collection within the left perirenal space measures 7.4 x 4.8 cm on 36/2. An adjacent or contiguous more superficial component, extending into the left flank, is also new since the  prior MRI at 5.5 x 3.2 cm on 39/2. No hydronephrosis. No bladder calculi. Stomach/Bowel: Proximal gastric underdistention. Extensive colonic diverticulosis. Normal appendix. Normal small bowel caliber. Vascular/Lymphatic: Aortic atherosclerosis. Favor portosystemic collaterals within the omentum indicative of portal venous hypertension. No abdominopelvic adenopathy. Reproductive: Normal uterus and adnexa. Other: Moderate volume abdominopelvic ascites, similar to the prior MRI. No free intraperitoneal air. Anasarca is asymmetric left. Musculoskeletal: No acute osseous abnormality. IMPRESSION: 1. Limited exam secondary to stone study technique 2. Development of left perinephric fluid collection with adjacent component versus satellite collection extending into the left flank. Given clinical history, most likely related to progressive pseudocysts. 3. Relatively similar appearance of a presumed pseudocyst adjacent the pancreatic tail. 4. Cirrhosis with probable portal venous hypertension and moderate volume ascites. Small left pleural effusion. 5. Age advanced coronary artery atherosclerosis. Recommend assessment of coronary risk factors. 6. Cholecystectomy with possible stones within the cystic duct remnant. 7.  Aortic Atherosclerosis (ICD10-I70.0). Electronically Signed   By: Jeronimo Greaves M.D.   On: 12/24/2021 15:14    Anti-infectives: Anti-infectives (From admission, onward)    None        Assessment/Plan Pancreatic pseudocyst Leukocytosis - Child Pugh class B, MELD 19. Not a candidate for surgical intervention due to cirrhosis with portal hypertension - enlarging pseudocyst with narrowing of CBD - question stricture of main PD in the head of the pancreas. GI consulted and think this is less likely but splenic vasculature makes EUS high risk/not possible regardless. No plans for ERCP currently. - Ascites/possible SBP with paracentesis pending. Consider perc drainage of pseudocyst pending this   FEN:  low sodium diet ID: none VTE: none  I reviewed Consultant GI notes, hospitalist notes, last 24 h vitals and pain scores, last 48 h intake and output, last 24 h labs and trends, and last 24 h imaging results.  This care required moderate level of medical decision making.    LOS: 2 days   Eric Form, [facility] 12/26/2021, 9:42 AM Please see Amion for pager number during day hours 7:00am-4:30pm

## 2021-12-26 NOTE — Plan of Care (Signed)

## 2021-12-26 NOTE — Progress Notes (Signed)
  Transition of Care [facility]) Screening Note   Patient Details  Name: Mallory Stewart Date of Birth: Sep 05, 1960   [facility]) CM/SW Contact:    Otelia Santee, LCSW Phone Number: 12/26/2021, 12:51 PM    Transition of Care Department [facility]) has reviewed patient and no TOC needs have been identified at this time. We will continue to monitor patient advancement through interdisciplinary progression rounds. If new patient transition needs arise, please place a TOC consult.

## 2021-12-26 NOTE — Progress Notes (Signed)
Daily Progress Note  Hospital Day: 3  Chief Complaint:  cirrhosis, chronic pancreatitis, large pseudocyst  Brief History Mallory Stewart is a 61 y.o. female with a pmh not limited to   Assessment / Plan  # 61 yo female with ETOH cirrhosis with portal HTN with ascites. Etoh in remission. MELD Na+ is 20.   Recent reduction in diuretics by Dr.Gessner due to hyponatremia. Now with moderate ascites on imaging. Continue low sodium diet. Continue to hold diuretics with elevated creatinine.  HCC screening. AFP normal. No definite liver mass on CT scan with contrast 12/24/21 Varices screening: EGD Aug 2022 >. Type 1 isolated gastric varices. Small gastric varices on CT scan this admission. Carvedilol stopped earlier this month due to low BP Recent confusion issues without history of hepatic encephalopathy. Evaluated by Neurology . EEG was unrevealing, (mildly slow). No confusion on my exam today  She has a new patient appointment with [facility] tomorrow but rescheduled for next month since in hospital For diagnostic paracentesis today to evaluate leukocytosis. WBC improving 18 >> 16. If no SBP then leukocytosis concerning for ? Infected pseudocyst. Temp last night of 100.6   # Chronic Etoh pancreatitis complicated by pseudocyst / left mid back pain.  CT scan reviewed by Dr. Rhea Belton and Dr. Meridee Score, our advanced biliary endoscopist. The concern is for an enlarging, now retroperitoneal communicating pseudocyst nearly 13 cm. It doesn't appear infected on imaging but WBC is elevated.  It would be difficult to access the pseudocyst via EUS.  The stomach does not appear to closely oppose this pseudocyst and there is splenic vasculature in this area which may make EUS aspiration high risk or not possible.  Also, with abdominal ascites IR guided aspiration or drainage would be higher risk.  The pancreatic duct is not overtly dilated which argues against PD stricture.  For this reason ERCP is not  recommended right now. CBD dilation is chronic and her liver enzymes were not elevated. General Surgery is following.  CA 19-9 is normal at 15  # Chronic [place] anemia, likely chronic disease / cirrhosis related. Hgb is low but stable at 8.7. Difficult to discern what her baseline hgb is to significant fluctuations but seems to be around 11.     Subjective   Feels okay. Pain meds make her groggy so trying to avoid but has just requested something for the ongoing left back pain  Objective   Imaging:  CT ABDOMEN PELVIS W CONTRAST  Result Date: 12/24/2021 CLINICAL DATA:  Renal mass/cyst, indeterminate renal fluid collection EXAM: CT ABDOMEN AND PELVIS WITH CONTRAST TECHNIQUE: Multidetector CT imaging of the abdomen and pelvis was performed using the standard protocol following bolus administration of intravenous contrast. RADIATION DOSE REDUCTION: This exam was performed according to the departmental dose-optimization program which includes automated exposure control, adjustment of the mA and/or kV according to patient size and/or use of iterative reconstruction technique. CONTRAST:  OMNIPAQUE IOHEXOL 300 MG/ML  SOLN COMPARISON:  Noncontrast CT 12/24/2021, MRI 10/13/2021 FINDINGS: Lower chest: Small left pleural effusion with associated left basilar compressive atelectasis. Mild discoid atelectasis within the right lung base. Extensive coronary artery calcification. Global cardiac size within normal limits. Hepatobiliary: Morphologic changes in keeping with cirrhosis. Geographic peripheral areas of enhancement within the liver predominantly within segments 4 and 8 with associated capsular retraction may represent areas of confluent hepatic fibrosis. No definite intrahepatic enhancing mass identified. Stable moderate extrahepatic biliary ductal dilation, tapering within the pancreatic head likely related  to changes of chronic pancreatitis. Cholecystectomy has been performed. Pancreas: Mild pancreatic  parenchymal atrophy. Intraparenchymal loculated fluid collection within the tail the pancreas in keeping with a pancreatic pseudocyst appears decreased when compared to remote prior MRI examination, measuring 2.7 x 4.2 cm at axial image # 31/2, but is in continuity with the fluid collection seen within the a left posterior pararenal space which represents retroperitoneal extension of the pancreatic pseudocyst. This rim enhancing, septated collection measures 8.0 x 8.9 x 12.9 cm in greatest dimension on axial image # 41 and coronal image # 92. Spleen: Unremarkable Adrenals/Urinary Tract: The adrenal glands are unremarkable. The kidneys are normal in size and position. Mild mass effect upon the left kidney related to the left posterior pararenal pseudocyst. No hydronephrosis. No enhancing intrarenal masses. No intrarenal or ureteral calculi. The bladder is decompressed. Stomach/Bowel: Mild sigmoid diverticulosis. Small gastric varices are noted within the gastric fundus. Stomach, small bowel, and large bowel are otherwise unremarkable. Appendix normal. Moderate ascites is present, increased in volume when compared to prior MRI examination. No free intraperitoneal gas. Vascular/Lymphatic: Aortic atherosclerosis. No enlarged abdominal or pelvic lymph nodes. Reproductive: Uterus and bilateral adnexa are unremarkable. Other: No abdominal wall hernia Musculoskeletal: No acute bone abnormality. IMPRESSION: 1. Morphologic changes in keeping with cirrhosis and portal venous hypertension with small gastric varices. Moderate ascites, increased in volume when compared to prior MRI examination. 2. Interval decrease in size of a pancreatic pseudocyst within the tail the pancreas since MRI examination 10/13/2021, however, there is now a septated, rim-enhancing fluid collection within the left posterior pararenal space measuring up to 12.9 cm in greatest dimension. This is in continuity with the pancreatic pseudocyst and represents  retroperitoneal extension of the pancreatic pseudocyst. 3. Extensive coronary artery calcification. 4. Mild sigmoid diverticulosis. Aortic Atherosclerosis (ICD10-I70.0). Electronically Signed   By: Helyn Numbers M.D.   On: 12/24/2021 19:29   CT Renal Stone Study  Result Date: 12/24/2021 CLINICAL DATA:  Left flank swelling for the past 5 days. EXAM: CT ABDOMEN AND PELVIS WITHOUT CONTRAST TECHNIQUE: Multidetector CT imaging of the abdomen and pelvis was performed following the standard protocol without IV contrast. RADIATION DOSE REDUCTION: This exam was performed according to the departmental dose-optimization program which includes automated exposure control, adjustment of the mA and/or kV according to patient size and/or use of iterative reconstruction technique. COMPARISON:  10/13/2021 MRI FINDINGS: Lower chest: Right hemidiaphragm elevation. Bibasilar atelectasis. Normal heart size with right coronary artery calcification. Trace left pleural fluid. Hepatobiliary: Advanced cirrhosis, with caudate lobe enlargement. Cholecystectomy. Possible stones within the cystic duct remnant on 32/2. Common duct dilatation is suboptimally evaluated but felt to be less impressive than on the prior MRI. Pancreas: Suboptimally evaluated fluid collection adjacent the pancreatic tail measures on the order of 6.3 x 2.6 cm on 35/2 versus 6.2 x 3.8 cm when remeasured in a similar fashion on the prior MRI. Spleen: Normal noncontrast appearance including at 12.1 cm craniocaudal. Adrenals/Urinary Tract: Normal adrenal glands. Normal noncontrast appearance of the right kidney. A new fluid collection within the left perirenal space measures 7.4 x 4.8 cm on 36/2. An adjacent or contiguous more superficial component, extending into the left flank, is also new since the prior MRI at 5.5 x 3.2 cm on 39/2. No hydronephrosis. No bladder calculi. Stomach/Bowel: Proximal gastric underdistention. Extensive colonic diverticulosis. Normal  appendix. Normal small bowel caliber. Vascular/Lymphatic: Aortic atherosclerosis. Favor portosystemic collaterals within the omentum indicative of portal venous hypertension. No abdominopelvic adenopathy. Reproductive: Normal uterus and adnexa.  Other: Moderate volume abdominopelvic ascites, similar to the prior MRI. No free intraperitoneal air. Anasarca is asymmetric left. Musculoskeletal: No acute osseous abnormality. IMPRESSION: 1. Limited exam secondary to stone study technique 2. Development of left perinephric fluid collection with adjacent component versus satellite collection extending into the left flank. Given clinical history, most likely related to progressive pseudocysts. 3. Relatively similar appearance of a presumed pseudocyst adjacent the pancreatic tail. 4. Cirrhosis with probable portal venous hypertension and moderate volume ascites. Small left pleural effusion. 5. Age advanced coronary artery atherosclerosis. Recommend assessment of coronary risk factors. 6. Cholecystectomy with possible stones within the cystic duct remnant. 7.  Aortic Atherosclerosis (ICD10-I70.0). Electronically Signed   By: Jeronimo Greaves M.D.   On: 12/24/2021 15:14    Lab Results: Recent Labs    12/24/21 1351 12/25/21 0524 12/26/21 0535  WBC 18.8* 18.0* 16.0*  HGB 9.8* 8.4* 8.7*  HCT 29.7* 25.5* 26.7*  PLT 302 301 269   BMET Recent Labs    12/24/21 1351 12/25/21 0524 12/26/21 0535  NA 130* 130* 131*  K 4.4 4.5 4.0  CL 95* 97* 99  CO2 26 24 25   GLUCOSE 175* 166* 93  BUN 14 18 14   CREATININE 1.09* 1.33* 1.06*  CALCIUM 8.8* 8.5* 8.7*   LFT Recent Labs    12/26/21 0535  PROT 5.9*  ALBUMIN 2.4*  AST 14*  ALT 7  ALKPHOS 112  BILITOT 1.3*   PT/INR No results for input(s): "LABPROT", "INR" in the last 72 hours.   Scheduled inpatient medications:   insulin aspart  0-9 Units Subcutaneous Q4H   Continuous inpatient infusions:  PRN inpatient medications: HYDROmorphone (DILAUDID) injection,  oxyCODONE  Vital signs in last 24 hours: Temp:  [98.4 F (36.9 C)-100.6 F (38.1 C)] 99.5 F (37.5 C) (08/22 0742) Pulse Rate:  [94-110] 100 (08/22 0742) Resp:  [16-20] 18 (08/22 0742) BP: (104-121)/(59-80) 104/69 (08/22 0742) SpO2:  [94 %-100 %] 99 % (08/22 0742) Last BM Date : 12/24/21 (per pt)  Intake/Output Summary (Last 24 hours) at 12/26/2021 0901 Last data filed at 12/25/2021 2109 Gross per 24 hour  Intake 240 ml  Output --  Net 240 ml    Intake/Output from previous day: 08/21 0701 - 08/22 0700 In: 240 [P.O.:240] Out: -  Intake/Output this shift: No intake/output data recorded.   Physical Exam:  General: Alert female in NAD Heart:  Regular rate and rhythm. No lower extremity edema Pulmonary: Normal respiratory effort Abdomen: Soft, nondistended, nontender. Normal bowel sounds.  Neurologic: Alert and oriented Psych: Pleasant. Cooperative.    Principal Problem:   Pancreatic pseudocyst Active Problems:   Hypertension associated with diabetes (HCC)   Chronic hyponatremia   Alcoholic cirrhosis of liver with ascites (HCC)   SIRS (systemic inflammatory response syndrome) (HCC)     LOS: 2 days   9/21 ,NP 12/26/2021, 9:01 AM

## 2021-12-26 NOTE — Progress Notes (Signed)
[facility]  PROGRESS NOTE  Mallory Stewart XBM:[id number] DOB: 1960-09-27 DOA: 12/24/2021 PCP: Excell Seltzer, MD   Brief HPI:   61 year old female with medical history of diabetes mellitus type 2, hypertension, chronic hyponatremia, alcoholic cirrhosis, chronic pancreatitis, pancreatic pseudocyst, GERD, CKD stage II-III came to ED complaining of left flank pain and swelling.  CT showing interval decrease in size of a pancreatic pseudocyst within the tail of the pancreas since MRI examination 10/13/2021, however, there is now a septated, rim-enhancing fluid collection within the left posterior pararenal space measuring up to 12.9 cm in greatest dimension.  This is in continuity with the pancreatic pseudocyst and represents retroperitoneal extension of the pancreatic pseudocyst. General surgery consulted.    Subjective   Patient seen and examined, continues to have left flank pain.  No plan for intervention as per GI or general surgery at this time.  Patient to undergo diagnostic paracentesis today.   Assessment/Plan:   Enlarging pancreatic pseudocyst -CT showing interval decrease in size of a pancreatic pseudocyst within the tail of the pancreas since MRI examination 10/13/2021, however, there is now a septated, rim-enhancing fluid collection within the left posterior pararenal space measuring up to 12.9 cm in greatest dimension.  This is in continuity with the pancreatic pseudocyst and represents retroperitoneal extension of the pancreatic pseudocyst.  General surgery feels that she has a stricture of the main pancreatic duct in the head of the pancreas leading to a blowout in the tail with subsequent pseudocyst formation -Pseudocyst will likely require drainage however general surgery does not feel that patient is a surgical candidate given history of liver cirrhosis and portal hypertension -Gastroenterology has been consulted for further management for enlarging pancreatic  pseudocyst -Both GI and surgery following, no plan for intervention at this time. -We will continue to follow  Alcoholic cirrhosis with ascites -CT abdomen pelvis done yesterday only showed moderate volume ascites -No fever, SBP felt less likely -GI following -Plan for diagnostic paracentesis today  Chronic hyponatremia -Patient has stable mild hyponatremia due to liver cirrhosis -Sodium is stable at 131  CKD stage III a -Creatinine is stable   Medications     insulin aspart  0-9 Units Subcutaneous Q4H     Data Reviewed:   CBG:  Recent Labs  Lab 12/25/21 2003 12/26/21 0032 12/26/21 0417 12/26/21 0741 12/26/21 1215  GLUCAP 125* 104* 93 71 114*    SpO2: 100 %    Vitals:   12/26/21 0742 12/26/21 1119 12/26/21 1205 12/26/21 1218  BP: 104/69 123/80 119/78 113/81  Pulse: 100   (!) 103  Resp: 18   18  Temp: 99.5 F (37.5 C)   98.7 F (37.1 C)  TempSrc: Oral   Oral  SpO2: 99%   100%  Weight:      Height:          Data Reviewed:  Basic Metabolic Panel: Recent Labs  Lab 12/24/21 1351 12/25/21 0524 12/26/21 0535  NA 130* 130* 131*  K 4.4 4.5 4.0  CL 95* 97* 99  CO2 26 24 25   GLUCOSE 175* 166* 93  BUN 14 18 14   CREATININE 1.09* 1.33* 1.06*  CALCIUM 8.8* 8.5* 8.7*    CBC: Recent Labs  Lab 12/24/21 1351 12/25/21 0524 12/26/21 0535  WBC 18.8* 18.0* 16.0*  NEUTROABS 16.1*  --   --   HGB 9.8* 8.4* 8.7*  HCT 29.7* 25.5* 26.7*  MCV 89.5 89.5 89.9  PLT 302 301 269    LFT Recent  Labs  Lab 12/24/21 1351 12/25/21 0524 12/26/21 0535  AST 19 15 14*  ALT 9 8 7   ALKPHOS 139* 132* 112  BILITOT 1.4* 0.9 1.3*  PROT 7.0 6.0* 5.9*  ALBUMIN 2.9* 2.5* 2.4*     Antibiotics: Anti-infectives (From admission, onward)    None        DVT prophylaxis: SCDs  Code Status: Full code  Family Communication: No family at bedside   CONSULTS gastroenterology, general surgery   Objective    Physical Examination:   General-appears in no  acute distress Heart-S1-S2, regular, no murmur auscultated Lungs-clear to auscultation bilaterally, no wheezing or crackles auscultated Abdomen-soft, nontender, no organomegaly Extremities-no edema in the lower extremities Neuro-alert, oriented x3, no focal deficit noted  Status is: Inpatient:               [facility] If 7PM-7AM, please contact night-coverage at [email or web address], Office  [phone]   12/26/2021, 2:54 PM  LOS: 2 days

## 2021-12-26 NOTE — Procedures (Signed)
PROCEDURE SUMMARY:  Successful ultrasound guided paracentesis from the right upper to mid quadrant.  Yielded 1 liter (max ordered) of yellow fluid.  No immediate complications.  The patient tolerated the procedure well.   Specimen was sent for labs.  EBL < 18mL  If the patient eventually requires >/=2 paracenteses in a 30 day period, screening evaluation by the [facility] Interventional Radiology Portal Hypertension Clinic will be assessed.   Artemio Aly

## 2021-12-27 ENCOUNTER — Inpatient Hospital Stay (HOSPITAL_COMMUNITY): Payer: 59

## 2021-12-27 DIAGNOSIS — K7031 Alcoholic cirrhosis of liver with ascites: Secondary | ICD-10-CM | POA: Diagnosis not present

## 2021-12-27 DIAGNOSIS — D72829 Elevated white blood cell count, unspecified: Secondary | ICD-10-CM | POA: Diagnosis not present

## 2021-12-27 DIAGNOSIS — K863 Pseudocyst of pancreas: Secondary | ICD-10-CM | POA: Diagnosis not present

## 2021-12-27 DIAGNOSIS — R109 Unspecified abdominal pain: Secondary | ICD-10-CM | POA: Diagnosis not present

## 2021-12-27 LAB — GLUCOSE, CAPILLARY
Glucose-Capillary: 130 mg/dL — ABNORMAL HIGH (ref 70–99)
Glucose-Capillary: 136 mg/dL — ABNORMAL HIGH (ref 70–99)
Glucose-Capillary: 139 mg/dL — ABNORMAL HIGH (ref 70–99)
Glucose-Capillary: 143 mg/dL — ABNORMAL HIGH (ref 70–99)
Glucose-Capillary: 165 mg/dL — ABNORMAL HIGH (ref 70–99)
Glucose-Capillary: 237 mg/dL — ABNORMAL HIGH (ref 70–99)

## 2021-12-27 LAB — COMPREHENSIVE METABOLIC PANEL
ALT: 11 U/L (ref 0–44)
AST: 21 U/L (ref 15–41)
Albumin: 2.5 g/dL — ABNORMAL LOW (ref 3.5–5.0)
Alkaline Phosphatase: 136 U/L — ABNORMAL HIGH (ref 38–126)
Anion gap: 10 (ref 5–15)
BUN: 15 mg/dL (ref 6–20)
CO2: 25 mmol/L (ref 22–32)
Calcium: 8.8 mg/dL — ABNORMAL LOW (ref 8.9–10.3)
Chloride: 94 mmol/L — ABNORMAL LOW (ref 98–111)
Creatinine, Ser: 1.08 mg/dL — ABNORMAL HIGH (ref 0.44–1.00)
GFR, Estimated: 60 mL/min — ABNORMAL LOW (ref >60)
Glucose, Bld: 141 mg/dL — ABNORMAL HIGH (ref 70–99)
Potassium: 5 mmol/L (ref 3.5–5.1)
Sodium: 129 mmol/L — ABNORMAL LOW (ref 135–145)
Total Bilirubin: 1.4 mg/dL — ABNORMAL HIGH (ref 0.3–1.2)
Total Protein: 6.2 g/dL — ABNORMAL LOW (ref 6.5–8.1)

## 2021-12-27 LAB — BODY FLUID CELL COUNT WITH DIFFERENTIAL
Lymphs, Fluid: 20 %
Monocyte-Macrophage-Serous Fluid: 50 % (ref 50–90)
Neutrophil Count, Fluid: 30 % — ABNORMAL HIGH (ref 0–25)
Total Nucleated Cell Count, Fluid: 561 cu mm (ref 0–1000)

## 2021-12-27 LAB — PATHOLOGIST SMEAR REVIEW

## 2021-12-27 LAB — CBC
HCT: 26.9 % — ABNORMAL LOW (ref 36.0–46.0)
Hemoglobin: 9 g/dL — ABNORMAL LOW (ref 12.0–15.0)
MCH: 29.7 pg (ref 26.0–34.0)
MCHC: 33.5 g/dL (ref 30.0–36.0)
MCV: 88.8 fL (ref 80.0–100.0)
Platelets: 291 10*3/uL (ref 150–400)
RBC: 3.03 MIL/uL — ABNORMAL LOW (ref 3.87–5.11)
RDW: 13.8 % (ref 11.5–15.5)
WBC: 22.8 10*3/uL — ABNORMAL HIGH (ref 4.0–10.5)
nRBC: 0 % (ref 0.0–0.2)

## 2021-12-27 MED ORDER — LIDOCAINE HCL 1 % IJ SOLN
INTRAMUSCULAR | Status: AC
  Filled 2021-12-27: qty 20

## 2021-12-27 MED ORDER — ACETAMINOPHEN 325 MG PO TABS
325.0000 mg | ORAL_TABLET | Freq: Once | ORAL | Status: AC
  Administered 2021-12-27: 325 mg via ORAL
  Filled 2021-12-27: qty 1

## 2021-12-27 MED ORDER — DIPHENHYDRAMINE HCL 25 MG PO CAPS
25.0000 mg | ORAL_CAPSULE | Freq: Four times a day (QID) | ORAL | Status: DC | PRN
  Administered 2021-12-27 – 2021-12-29 (×7): 25 mg via ORAL
  Filled 2021-12-27 (×8): qty 1

## 2021-12-27 MED ORDER — PANTOPRAZOLE SODIUM 40 MG PO TBEC
40.0000 mg | DELAYED_RELEASE_TABLET | Freq: Two times a day (BID) | ORAL | Status: DC
  Administered 2021-12-27 – 2022-01-02 (×13): 40 mg via ORAL
  Filled 2021-12-27 (×13): qty 1

## 2021-12-27 NOTE — H&P (View-Only) (Signed)
Daily Progress Note  Hospital Day: 4  Chief Complaint: cirrhosis, chronic pancreatitis, large pseudocyst  Assessment / Plan   # 61 yo female with ETOH cirrhosis with portal HTN with ascites. Etoh in remission.    Recent reduction in diuretics by Dr.Gessner due to hyponatremia. Now with moderate ascites on imaging. Continue low sodium diet. Diuretics on hold due to elevated creatinine. Creatinine is staying around 1.08, GFR 60. Hopefully we can resume diuretics soon.  HCC screening. AFP normal. No definite liver mass on CT scan with contrast 12/24/21 Varices screening: EGD Aug 2022 >. Type 1 isolated gastric varices. Small gastric varices on CT scan this admission. Carvedilol stopped earlier this month due to low BP She has a new patient appointment with [facility] next month since in hospital Interval History: Paracentesis yesterday, 1 liter removed. Elevated PMN but didn't meet criteria for SBP. Culture is pending. Given worsening WBC plan is to repeat tap with fluid studies today.    # Chronic Etoh pancreatitis complicated by pseudocyst / left mid back pain.  CT scan reviewed by Dr. Rhea Belton and Dr. Meridee Score ( advanced biliary endoscopist). The concern is for an enlarging, now retroperitoneal communicating pseudocyst nearly 13 cm. It doesn't appear infected on imaging but WBC is elevated.  She is scheduled for EUS tomorrow afternoon General Surgery is following.  CA 19-9 is normal at 15 Interval History: Worsening leukocytosis >> 22.8K, Tmax 100.8.  Negative blood cultures. Didn't meet SBP criteria. No evidence for infected pseudocyst on imaging but clinically there is some concern with fever and WBC. Plan is to repeat paracentesis with fluid studies today   # Chronic Ebro anemia, likely chronic disease / cirrhosis related. Hgb is low but stable at 8.7. Difficult to discern what her baseline hgb is to significant fluctuations but seems to be around 11.    Subjective   Still  having intermittent left back pain, otherwise her night was uneventful  Objective    Imaging:  US Paracentesis  Result Date: 12/26/2021 INDICATION: Patient with history of pancreatic pseudocyst, chronic pancreatitis, alcoholic cirrhosis, cirrhosis by imaging, ascites, chronic kidney disease; request received for diagnostic and therapeutic paracentesis up to 1 liter. EXAM: ULTRASOUND GUIDED DIAGNOSTIC AND THERAPEUTIC PARACENTESIS MEDICATIONS: 8 mL 1% lidocaine COMPLICATIONS: None immediate. PROCEDURE: Informed written consent was obtained from the patient after a discussion of the risks, benefits and alternatives to treatment. A timeout was performed prior to the initiation of the procedure. Initial ultrasound scanning demonstrates a small to moderate amount of ascites within the right upper to mid abdominal quadrant. The right upper to mid abdomen was prepped and draped in the usual sterile fashion. 1% lidocaine was used for local anesthesia. Following this, a 19 gauge, 7-cm, Yueh catheter was introduced. An ultrasound image was saved for documentation purposes. The paracentesis was performed. The catheter was removed and a dressing was applied. The patient tolerated the procedure well without immediate post procedural complication. FINDINGS: A total of approximately 1 liter of yellow fluid was removed. Samples were sent to the laboratory as requested by the clinical team. IMPRESSION: Successful ultrasound-guided diagnostic and therapeutic paracentesis yielding 1 liter of peritoneal fluid. PLAN: If the patient eventually requires >/=2 paracenteses in a 30 day period, candidacy for formal evaluation by the [facility] Interventional Radiology Portal Hypertension Clinic will be assessed. Read by: Jeananne Rama, PA-C Electronically Signed   By: Acquanetta Belling M.D.   On: 12/26/2021 13:11   CT ABDOMEN PELVIS W CONTRAST  Result Date:  12/24/2021 CLINICAL DATA:  Renal mass/cyst, indeterminate renal fluid collection  EXAM: CT ABDOMEN AND PELVIS WITH CONTRAST TECHNIQUE: Multidetector CT imaging of the abdomen and pelvis was performed using the standard protocol following bolus administration of intravenous contrast. RADIATION DOSE REDUCTION: This exam was performed according to the departmental dose-optimization program which includes automated exposure control, adjustment of the mA and/or kV according to patient size and/or use of iterative reconstruction technique. CONTRAST:  OMNIPAQUE IOHEXOL 300 MG/ML  SOLN COMPARISON:  Noncontrast CT 12/24/2021, MRI 10/13/2021 FINDINGS: Lower chest: Small left pleural effusion with associated left basilar compressive atelectasis. Mild discoid atelectasis within the right lung base. Extensive coronary artery calcification. Global cardiac size within normal limits. Hepatobiliary: Morphologic changes in keeping with cirrhosis. Geographic peripheral areas of enhancement within the liver predominantly within segments 4 and 8 with associated capsular retraction may represent areas of confluent hepatic fibrosis. No definite intrahepatic enhancing mass identified. Stable moderate extrahepatic biliary ductal dilation, tapering within the pancreatic head likely related to changes of chronic pancreatitis. Cholecystectomy has been performed. Pancreas: Mild pancreatic parenchymal atrophy. Intraparenchymal loculated fluid collection within the tail the pancreas in keeping with a pancreatic pseudocyst appears decreased when compared to remote prior MRI examination, measuring 2.7 x 4.2 cm at axial image # 31/2, but is in continuity with the fluid collection seen within the a left posterior pararenal space which represents retroperitoneal extension of the pancreatic pseudocyst. This rim enhancing, septated collection measures 8.0 x 8.9 x 12.9 cm in greatest dimension on axial image # 41 and coronal image # 92. Spleen: Unremarkable Adrenals/Urinary Tract: The adrenal glands are unremarkable. The kidneys  are normal in size and position. Mild mass effect upon the left kidney related to the left posterior pararenal pseudocyst. No hydronephrosis. No enhancing intrarenal masses. No intrarenal or ureteral calculi. The bladder is decompressed. Stomach/Bowel: Mild sigmoid diverticulosis. Small gastric varices are noted within the gastric fundus. Stomach, small bowel, and large bowel are otherwise unremarkable. Appendix normal. Moderate ascites is present, increased in volume when compared to prior MRI examination. No free intraperitoneal gas. Vascular/Lymphatic: Aortic atherosclerosis. No enlarged abdominal or pelvic lymph nodes. Reproductive: Uterus and bilateral adnexa are unremarkable. Other: No abdominal wall hernia Musculoskeletal: No acute bone abnormality. IMPRESSION: 1. Morphologic changes in keeping with cirrhosis and portal venous hypertension with small gastric varices. Moderate ascites, increased in volume when compared to prior MRI examination. 2. Interval decrease in size of a pancreatic pseudocyst within the tail the pancreas since MRI examination 10/13/2021, however, there is now a septated, rim-enhancing fluid collection within the left posterior pararenal space measuring up to 12.9 cm in greatest dimension. This is in continuity with the pancreatic pseudocyst and represents retroperitoneal extension of the pancreatic pseudocyst. 3. Extensive coronary artery calcification. 4. Mild sigmoid diverticulosis. Aortic Atherosclerosis (ICD10-I70.0). Electronically Signed   By: Helyn Numbers M.D.   On: 12/24/2021 19:29   CT Renal Stone Study  Result Date: 12/24/2021 CLINICAL DATA:  Left flank swelling for the past 5 days. EXAM: CT ABDOMEN AND PELVIS WITHOUT CONTRAST TECHNIQUE: Multidetector CT imaging of the abdomen and pelvis was performed following the standard protocol without IV contrast. RADIATION DOSE REDUCTION: This exam was performed according to the departmental dose-optimization program which  includes automated exposure control, adjustment of the mA and/or kV according to patient size and/or use of iterative reconstruction technique. COMPARISON:  10/13/2021 MRI FINDINGS: Lower chest: Right hemidiaphragm elevation. Bibasilar atelectasis. Normal heart size with right coronary artery calcification. Trace left pleural fluid. Hepatobiliary: Advanced  cirrhosis, with caudate lobe enlargement. Cholecystectomy. Possible stones within the cystic duct remnant on 32/2. Common duct dilatation is suboptimally evaluated but felt to be less impressive than on the prior MRI. Pancreas: Suboptimally evaluated fluid collection adjacent the pancreatic tail measures on the order of 6.3 x 2.6 cm on 35/2 versus 6.2 x 3.8 cm when remeasured in a similar fashion on the prior MRI. Spleen: Normal noncontrast appearance including at 12.1 cm craniocaudal. Adrenals/Urinary Tract: Normal adrenal glands. Normal noncontrast appearance of the right kidney. A new fluid collection within the left perirenal space measures 7.4 x 4.8 cm on 36/2. An adjacent or contiguous more superficial component, extending into the left flank, is also new since the prior MRI at 5.5 x 3.2 cm on 39/2. No hydronephrosis. No bladder calculi. Stomach/Bowel: Proximal gastric underdistention. Extensive colonic diverticulosis. Normal appendix. Normal small bowel caliber. Vascular/Lymphatic: Aortic atherosclerosis. Favor portosystemic collaterals within the omentum indicative of portal venous hypertension. No abdominopelvic adenopathy. Reproductive: Normal uterus and adnexa. Other: Moderate volume abdominopelvic ascites, similar to the prior MRI. No free intraperitoneal air. Anasarca is asymmetric left. Musculoskeletal: No acute osseous abnormality. IMPRESSION: 1. Limited exam secondary to stone study technique 2. Development of left perinephric fluid collection with adjacent component versus satellite collection extending into the left flank. Given clinical history,  most likely related to progressive pseudocysts. 3. Relatively similar appearance of a presumed pseudocyst adjacent the pancreatic tail. 4. Cirrhosis with probable portal venous hypertension and moderate volume ascites. Small left pleural effusion. 5. Age advanced coronary artery atherosclerosis. Recommend assessment of coronary risk factors. 6. Cholecystectomy with possible stones within the cystic duct remnant. 7.  Aortic Atherosclerosis (ICD10-I70.0). Electronically Signed   By: Jeronimo Greaves M.D.   On: 12/24/2021 15:14    Lab Results: Recent Labs    12/25/21 0524 12/26/21 0535 12/27/21 0604  WBC 18.0* 16.0* 22.8*  HGB 8.4* 8.7* 9.0*  HCT 25.5* 26.7* 26.9*  PLT 301 269 291   BMET Recent Labs    12/25/21 0524 12/26/21 0535 12/27/21 0604  NA 130* 131* 129*  K 4.5 4.0 5.0  CL 97* 99 94*  CO2 24 25 25   GLUCOSE 166* 93 141*  BUN 18 14 15   CREATININE 1.33* 1.06* 1.08*  CALCIUM 8.5* 8.7* 8.8*   LFT Recent Labs    12/27/21 0604  PROT 6.2*  ALBUMIN 2.5*  AST 21  ALT 11  ALKPHOS 136*  BILITOT 1.4*   PT/INR No results for input(s): "LABPROT", "INR" in the last 72 hours.   Scheduled inpatient medications:   insulin aspart  0-9 Units Subcutaneous Q4H   Continuous inpatient infusions:  PRN inpatient medications: HYDROmorphone (DILAUDID) injection, oxyCODONE  Vital signs in last 24 hours: Temp:  [98.1 F (36.7 C)-100.8 F (38.2 C)] 98.1 F (36.7 C) (08/23 0757) Pulse Rate:  [97-111] 101 (08/23 0757) Resp:  [16-20] 20 (08/23 0613) BP: (102-123)/(67-81) 102/71 (08/23 0757) SpO2:  [94 %-100 %] 99 % (08/23 0757) Last BM Date : 12/24/21  Intake/Output Summary (Last 24 hours) at 12/27/2021 0842 Last data filed at 12/27/2021 0615 Gross per 24 hour  Intake 600 ml  Output --  Net 600 ml    Intake/Output from previous day: 08/22 0701 - 08/23 0700 In: 600 [P.O.:600] Out: -  Intake/Output this shift: No intake/output data recorded.   Physical Exam:  General: Alert  female in NAD Heart:  Regular rate and rhythm. No lower extremity edema Pulmonary: Normal respiratory effort Abdomen: Soft, nondistended, nontender. Normal bowel sounds.  Neurologic:  Alert and oriented Psych: Pleasant. Cooperative.    Principal Problem:   Pancreatic pseudocyst Active Problems:   Hypertension associated with diabetes (HCC)   Chronic hyponatremia   Alcoholic cirrhosis of liver with ascites (HCC)   SIRS (systemic inflammatory response syndrome) (HCC)   Left flank pain     LOS: 3 days   Willette Cluster ,NP 12/27/2021, 8:42 AM

## 2021-12-27 NOTE — Progress Notes (Signed)
[facility]  PROGRESS NOTE  Mallory Stewart WCB:[id number] DOB: 05/27/1960 DOA: 12/24/2021 PCP: Excell Seltzer, MD   Brief HPI:   61 year old female with medical history of diabetes mellitus type 2, hypertension, chronic hyponatremia, alcoholic cirrhosis, chronic pancreatitis, pancreatic pseudocyst, GERD, CKD stage II-III came to ED complaining of left flank pain and swelling.  CT showing interval decrease in size of a pancreatic pseudocyst within the tail of the pancreas since MRI examination 10/13/2021, however, there is now a septated, rim-enhancing fluid collection within the left posterior pararenal space measuring up to 12.9 cm in greatest dimension.  This is in continuity with the pancreatic pseudocyst and represents retroperitoneal extension of the pancreatic pseudocyst. GI and general surgery following.   Subjective   Swelling on left flank   Assessment/Plan:   Enlarging pancreatic pseudocyst -CT showing interval decrease in size of a pancreatic pseudocyst within the tail of the pancreas since MRI examination 10/13/2021, however, there is now a septated, rim-enhancing fluid collection within the left posterior pararenal space measuring up to 12.9 cm in greatest dimension.  This is in continuity with the pancreatic pseudocyst and represents retroperitoneal extension of the pancreatic pseudocyst.  -General surgery feels that she has a stricture of the main pancreatic duct in the head of the pancreas leading to a blowout in the tail with subsequent pseudocyst formation -Pseudocyst will likely require drainage however general surgery does not feel that patient is a surgical candidate given history of liver cirrhosis and portal hypertension -Gastroenterology has been consulted for further management for enlarging pancreatic pseudocyst -Both GI and surgery following -fever overnight 8/22 along with WBC count trending up--- abx on hold  Alcoholic cirrhosis with ascites -CT abdomen  pelvis done yesterday only showed moderate volume ascites -GI following -s/p diagnostic paracentesis 8/22 and 8/23: per GI: 1 liter removed. Elevated PMN but didn't meet criteria for SBP. Culture is pending. Given worsening WBC plan is to repeat tap with fluid studies today.   Chronic hyponatremia -mild hyponatremia due to liver cirrhosis   CKD stage IIIa -Creatinine is stable    Data Reviewed:   CBG:  Recent Labs  Lab 12/26/21 1701 12/26/21 2016 12/27/21 0017 12/27/21 0407 12/27/21 0755  GLUCAP 161* 188* 143* 136* 130*    SpO2: 99 %    Vitals:   12/26/21 2024 12/27/21 0417 12/27/21 0613 12/27/21 0757  BP: 115/70 115/67 117/69 102/71  Pulse: 97 (!) 111 (!) 111 (!) 101  Resp: 20 16 20    Temp: 99 F (37.2 C) (!) 100.8 F (38.2 C) 100.1 F (37.8 C) 98.1 F (36.7 C)  TempSrc:  Oral Oral Oral  SpO2: 99% 94% 96% 99%  Weight:      Height:          Data Reviewed:  Basic Metabolic Panel: Recent Labs  Lab 12/24/21 1351 12/25/21 0524 12/26/21 0535 12/27/21 0604  NA 130* 130* 131* 129*  K 4.4 4.5 4.0 5.0  CL 95* 97* 99 94*  CO2 26 24 25 25   GLUCOSE 175* 166* 93 141*  BUN 14 18 14 15   CREATININE 1.09* 1.33* 1.06* 1.08*  CALCIUM 8.8* 8.5* 8.7* 8.8*    CBC: Recent Labs  Lab 12/24/21 1351 12/25/21 0524 12/26/21 0535 12/27/21 0604  WBC 18.8* 18.0* 16.0* 22.8*  NEUTROABS 16.1*  --   --   --   HGB 9.8* 8.4* 8.7* 9.0*  HCT 29.7* 25.5* 26.7* 26.9*  MCV 89.5 89.5 89.9 88.8  PLT 302 301 269 291  LFT Recent Labs  Lab 12/24/21 1351 12/25/21 0524 12/26/21 0535 12/27/21 0604  AST 19 15 14* 21  ALT 9 8 7 11   ALKPHOS 139* 132* 112 136*  BILITOT 1.4* 0.9 1.3* 1.4*  PROT 7.0 6.0* 5.9* 6.2*  ALBUMIN 2.9* 2.5* 2.4* 2.5*      DVT prophylaxis: SCDs  Code Status: Full code  Family Communication: No family at bedside   CONSULTS gastroenterology, general surgery   Objective    Physical Examination:    General: Appearance:    Well  developed, well nourished female in no acute distress     Lungs:     respirations unlabored  Heart:    Tachycardic. Normal rhythm. No murmurs, rubs, or gallops.   MS:   All extremities are intact.   Neurologic:   Awake, alert     Status is: Inpatient:         [facility] If 7PM-7AM, please contact night-coverage at [email or web address],    12/27/2021, 11:42 AM  LOS: 3 days

## 2021-12-27 NOTE — Progress Notes (Signed)
       CROSS COVER NOTE  NAME: Mallory Stewart MRN: [id number] DOB : [birth year]    Date of Service   12/27/2021   HPI/Events of Note   Notified by nursing of patient Temp 38.2C  Interventions   Plan: 325mg  Acetaminophen     This document was prepared using Dragon voice recognition software and may include unintentional dictation errors.  DNP, MHA, FNP-BC Nurse Practitioner [facility] Pager [phone]

## 2021-12-27 NOTE — Procedures (Signed)
PROCEDURE SUMMARY:  Successful ultrasound guided paracentesis from the right upper to mid quadrant.  Yielded 500 cc of yellow fluid (max ordered) No immediate complications.  The patient tolerated the procedure well.   Specimen was sent for labs.  EBL < 63mL  The patient has required >/=2 paracenteses in a 30 day period and a screening evaluation by the [facility] Interventional Radiology Portal Hypertension Clinic has been arranged.  Artemio Aly

## 2021-12-27 NOTE — Consult Note (Signed)
Daily Progress Note  Hospital Day: 4  Chief Complaint: cirrhosis, chronic pancreatitis, large pseudocyst  Assessment / Plan   # 61 yo female with ETOH cirrhosis with portal HTN with ascites. Etoh in remission.    Recent reduction in diuretics by Dr.Gessner due to hyponatremia. Now with moderate ascites on imaging. Continue low sodium diet. Diuretics on hold due to elevated creatinine. Creatinine is staying around 1.08, GFR 60. Hopefully we can resume diuretics soon.  HCC screening. AFP normal. No definite liver mass on CT scan with contrast 12/24/21 Varices screening: EGD Aug 2022 >. Type 1 isolated gastric varices. Small gastric varices on CT scan this admission. Carvedilol stopped earlier this month due to low BP She has a new patient appointment with [facility] next month since in hospital Interval History: Paracentesis yesterday, 1 liter removed. Elevated PMN but didn't meet criteria for SBP. Culture is pending. Given worsening WBC plan is to repeat tap with fluid studies today.    # Chronic Etoh pancreatitis complicated by pseudocyst / left mid back pain.  CT scan reviewed by Dr. Rhea Belton and Dr. Meridee Score ( advanced biliary endoscopist). The concern is for an enlarging, now retroperitoneal communicating pseudocyst nearly 13 cm. It doesn't appear infected on imaging but WBC is elevated.  She is scheduled for EUS tomorrow afternoon General Surgery is following.  CA 19-9 is normal at 15 Interval History: Worsening leukocytosis >> 22.8K, Tmax 100.8.  Negative blood cultures. Didn't meet SBP criteria. No evidence for infected pseudocyst on imaging but clinically there is some concern with fever and WBC. Plan is to repeat paracentesis with fluid studies today   # Chronic Ebro anemia, likely chronic disease / cirrhosis related. Hgb is low but stable at 8.7. Difficult to discern what her baseline hgb is to significant fluctuations but seems to be around 11.    Subjective   Still  having intermittent left back pain, otherwise her night was uneventful  Objective    Imaging:  US Paracentesis  Result Date: 12/26/2021 INDICATION: Patient with history of pancreatic pseudocyst, chronic pancreatitis, alcoholic cirrhosis, cirrhosis by imaging, ascites, chronic kidney disease; request received for diagnostic and therapeutic paracentesis up to 1 liter. EXAM: ULTRASOUND GUIDED DIAGNOSTIC AND THERAPEUTIC PARACENTESIS MEDICATIONS: 8 mL 1% lidocaine COMPLICATIONS: None immediate. PROCEDURE: Informed written consent was obtained from the patient after a discussion of the risks, benefits and alternatives to treatment. A timeout was performed prior to the initiation of the procedure. Initial ultrasound scanning demonstrates a small to moderate amount of ascites within the right upper to mid abdominal quadrant. The right upper to mid abdomen was prepped and draped in the usual sterile fashion. 1% lidocaine was used for local anesthesia. Following this, a 19 gauge, 7-cm, Yueh catheter was introduced. An ultrasound image was saved for documentation purposes. The paracentesis was performed. The catheter was removed and a dressing was applied. The patient tolerated the procedure well without immediate post procedural complication. FINDINGS: A total of approximately 1 liter of yellow fluid was removed. Samples were sent to the laboratory as requested by the clinical team. IMPRESSION: Successful ultrasound-guided diagnostic and therapeutic paracentesis yielding 1 liter of peritoneal fluid. PLAN: If the patient eventually requires >/=2 paracenteses in a 30 day period, candidacy for formal evaluation by the [facility] will be assessed. Read by: Jeananne Rama, PA-C Electronically Signed   By: Acquanetta Belling M.D.   On: 12/26/2021 13:11   CT ABDOMEN PELVIS W CONTRAST  Result Date:  12/24/2021 CLINICAL DATA:  Renal mass/cyst, indeterminate renal fluid collection  EXAM: CT ABDOMEN AND PELVIS WITH CONTRAST TECHNIQUE: Multidetector CT imaging of the abdomen and pelvis was performed using the standard protocol following bolus administration of intravenous contrast. RADIATION DOSE REDUCTION: This exam was performed according to the departmental dose-optimization program which includes automated exposure control, adjustment of the mA and/or kV according to patient size and/or use of iterative reconstruction technique. CONTRAST:  OMNIPAQUE IOHEXOL 300 MG/ML  SOLN COMPARISON:  Noncontrast CT 12/24/2021, MRI 10/13/2021 FINDINGS: Lower chest: Small left pleural effusion with associated left basilar compressive atelectasis. Mild discoid atelectasis within the right lung base. Extensive coronary artery calcification. Global cardiac size within normal limits. Hepatobiliary: Morphologic changes in keeping with cirrhosis. Geographic peripheral areas of enhancement within the liver predominantly within segments 4 and 8 with associated capsular retraction may represent areas of confluent hepatic fibrosis. No definite intrahepatic enhancing mass identified. Stable moderate extrahepatic biliary ductal dilation, tapering within the pancreatic head likely related to changes of chronic pancreatitis. Cholecystectomy has been performed. Pancreas: Mild pancreatic parenchymal atrophy. Intraparenchymal loculated fluid collection within the tail the pancreas in keeping with a pancreatic pseudocyst appears decreased when compared to remote prior MRI examination, measuring 2.7 x 4.2 cm at axial image # 31/2, but is in continuity with the fluid collection seen within the a left posterior pararenal space which represents retroperitoneal extension of the pancreatic pseudocyst. This rim enhancing, septated collection measures 8.0 x 8.9 x 12.9 cm in greatest dimension on axial image # 41 and coronal image # 92. Spleen: Unremarkable Adrenals/Urinary Tract: The adrenal glands are unremarkable. The kidneys  are normal in size and position. Mild mass effect upon the left kidney related to the left posterior pararenal pseudocyst. No hydronephrosis. No enhancing intrarenal masses. No intrarenal or ureteral calculi. The bladder is decompressed. Stomach/Bowel: Mild sigmoid diverticulosis. Small gastric varices are noted within the gastric fundus. Stomach, small bowel, and large bowel are otherwise unremarkable. Appendix normal. Moderate ascites is present, increased in volume when compared to prior MRI examination. No free intraperitoneal gas. Vascular/Lymphatic: Aortic atherosclerosis. No enlarged abdominal or pelvic lymph nodes. Reproductive: Uterus and bilateral adnexa are unremarkable. Other: No abdominal wall hernia Musculoskeletal: No acute bone abnormality. IMPRESSION: 1. Morphologic changes in keeping with cirrhosis and portal venous hypertension with small gastric varices. Moderate ascites, increased in volume when compared to prior MRI examination. 2. Interval decrease in size of a pancreatic pseudocyst within the tail the pancreas since MRI examination 10/13/2021, however, there is now a septated, rim-enhancing fluid collection within the left posterior pararenal space measuring up to 12.9 cm in greatest dimension. This is in continuity with the pancreatic pseudocyst and represents retroperitoneal extension of the pancreatic pseudocyst. 3. Extensive coronary artery calcification. 4. Mild sigmoid diverticulosis. Aortic Atherosclerosis (ICD10-I70.0). Electronically Signed   By: Helyn Numbers M.D.   On: 12/24/2021 19:29   CT Renal Stone Study  Result Date: 12/24/2021 CLINICAL DATA:  Left flank swelling for the past 5 days. EXAM: CT ABDOMEN AND PELVIS WITHOUT CONTRAST TECHNIQUE: Multidetector CT imaging of the abdomen and pelvis was performed following the standard protocol without IV contrast. RADIATION DOSE REDUCTION: This exam was performed according to the departmental dose-optimization program which  includes automated exposure control, adjustment of the mA and/or kV according to patient size and/or use of iterative reconstruction technique. COMPARISON:  10/13/2021 MRI FINDINGS: Lower chest: Right hemidiaphragm elevation. Bibasilar atelectasis. Normal heart size with right coronary artery calcification. Trace left pleural fluid. Hepatobiliary: Advanced  cirrhosis, with caudate lobe enlargement. Cholecystectomy. Possible stones within the cystic duct remnant on 32/2. Common duct dilatation is suboptimally evaluated but felt to be less impressive than on the prior MRI. Pancreas: Suboptimally evaluated fluid collection adjacent the pancreatic tail measures on the order of 6.3 x 2.6 cm on 35/2 versus 6.2 x 3.8 cm when remeasured in a similar fashion on the prior MRI. Spleen: Normal noncontrast appearance including at 12.1 cm craniocaudal. Adrenals/Urinary Tract: Normal adrenal glands. Normal noncontrast appearance of the right kidney. A new fluid collection within the left perirenal space measures 7.4 x 4.8 cm on 36/2. An adjacent or contiguous more superficial component, extending into the left flank, is also new since the prior MRI at 5.5 x 3.2 cm on 39/2. No hydronephrosis. No bladder calculi. Stomach/Bowel: Proximal gastric underdistention. Extensive colonic diverticulosis. Normal appendix. Normal small bowel caliber. Vascular/Lymphatic: Aortic atherosclerosis. Favor portosystemic collaterals within the omentum indicative of portal venous hypertension. No abdominopelvic adenopathy. Reproductive: Normal uterus and adnexa. Other: Moderate volume abdominopelvic ascites, similar to the prior MRI. No free intraperitoneal air. Anasarca is asymmetric left. Musculoskeletal: No acute osseous abnormality. IMPRESSION: 1. Limited exam secondary to stone study technique 2. Development of left perinephric fluid collection with adjacent component versus satellite collection extending into the left flank. Given clinical history,  most likely related to progressive pseudocysts. 3. Relatively similar appearance of a presumed pseudocyst adjacent the pancreatic tail. 4. Cirrhosis with probable portal venous hypertension and moderate volume ascites. Small left pleural effusion. 5. Age advanced coronary artery atherosclerosis. Recommend assessment of coronary risk factors. 6. Cholecystectomy with possible stones within the cystic duct remnant. 7.  Aortic Atherosclerosis (ICD10-I70.0). Electronically Signed   By: Jeronimo Greaves M.D.   On: 12/24/2021 15:14    Lab Results: Recent Labs    12/25/21 0524 12/26/21 0535 12/27/21 0604  WBC 18.0* 16.0* 22.8*  HGB 8.4* 8.7* 9.0*  HCT 25.5* 26.7* 26.9*  PLT 301 269 291   BMET Recent Labs    12/25/21 0524 12/26/21 0535 12/27/21 0604  NA 130* 131* 129*  K 4.5 4.0 5.0  CL 97* 99 94*  CO2 24 25 25   GLUCOSE 166* 93 141*  BUN 18 14 15   CREATININE 1.33* 1.06* 1.08*  CALCIUM 8.5* 8.7* 8.8*   LFT Recent Labs    12/27/21 0604  PROT 6.2*  ALBUMIN 2.5*  AST 21  ALT 11  ALKPHOS 136*  BILITOT 1.4*   PT/INR No results for input(s): "LABPROT", "INR" in the last 72 hours.   Scheduled inpatient medications:   insulin aspart  0-9 Units Subcutaneous Q4H   Continuous inpatient infusions:  PRN inpatient medications: HYDROmorphone (DILAUDID) injection, oxyCODONE  Vital signs in last 24 hours: Temp:  [98.1 F (36.7 C)-100.8 F (38.2 C)] 98.1 F (36.7 C) (08/23 0757) Pulse Rate:  [97-111] 101 (08/23 0757) Resp:  [16-20] 20 (08/23 0613) BP: (102-123)/(67-81) 102/71 (08/23 0757) SpO2:  [94 %-100 %] 99 % (08/23 0757) Last BM Date : 12/24/21  Intake/Output Summary (Last 24 hours) at 12/27/2021 0842 Last data filed at 12/27/2021 0615 Gross per 24 hour  Intake 600 ml  Output --  Net 600 ml    Intake/Output from previous day: 08/22 0701 - 08/23 0700 In: 600 [P.O.:600] Out: -  Intake/Output this shift: No intake/output data recorded.   Physical Exam:  General: Alert  female in NAD Heart:  Regular rate and rhythm. No lower extremity edema Pulmonary: Normal respiratory effort Abdomen: Soft, nondistended, nontender. Normal bowel sounds.  Neurologic:  Alert and oriented Psych: Pleasant. Cooperative.    Principal Problem:   Pancreatic pseudocyst Active Problems:   Hypertension associated with diabetes (HCC)   Chronic hyponatremia   Alcoholic cirrhosis of liver with ascites (HCC)   SIRS (systemic inflammatory response syndrome) (HCC)   Left flank pain     LOS: 3 days   Willette Cluster ,NP 12/27/2021, 8:42 AM

## 2021-12-27 NOTE — Progress Notes (Signed)
   12/27/21 0417  Assess: MEWS Score  Temp (!) 100.8 F (38.2 C)  BP 115/67  MAP (mmHg) 82  Pulse Rate (!) 111  Resp 16  SpO2 94 %  Assess: MEWS Score  MEWS Temp 1  MEWS Systolic 0  MEWS Pulse 2  MEWS RR 0  MEWS LOC 0  MEWS Score 3  MEWS Score Color Yellow  Assess: if the MEWS score is Yellow or Red  Were vital signs taken at a resting state? Yes  Focused Assessment No change from prior assessment  Does the patient meet 2 or more of the SIRS criteria? Yes  Does the patient have a confirmed or suspected source of infection? Yes  Provider and Rapid Response Notified? No  MEWS guidelines implemented *See Row Information* Yes  Take Vital Signs  Increase Vital Sign Frequency  Yellow: Q 2hr X 2 then Q 4hr X 2, if remains yellow, continue Q 4hrs  Escalate  MEWS: Escalate Yellow: discuss with charge nurse/RN and consider discussing with provider and RRT  Notify: Charge Nurse/RN  Name of Charge Nurse/RN Notified Tom RN  Date Charge Nurse/RN Notified 12/27/21  Notify: Provider  Provider Name/Title Foust NP  Date Provider Notified 12/27/21  Provider response See new orders  Document  Patient Outcome Other (Comment) (remains on unit)  Assess: SIRS CRITERIA  SIRS Temperature  0  SIRS Pulse 1  SIRS Respirations  0  SIRS WBC 1  SIRS Score Sum  2

## 2021-12-27 NOTE — Progress Notes (Signed)
Continued to educate pt on fall risk & measures to ensure her safety. Pt continues to refuse bed alarm and prefers to ambulate on her own. Educated pt on calling staff to assist. Will continue to monitor.

## 2021-12-27 NOTE — Plan of Care (Signed)

## 2021-12-28 ENCOUNTER — Inpatient Hospital Stay (HOSPITAL_COMMUNITY): Payer: 59 | Admitting: Certified Registered"

## 2021-12-28 ENCOUNTER — Encounter (HOSPITAL_COMMUNITY)
Admission: EM | Disposition: A | Discharge status: Home / Self Care | Payer: Self-pay | Source: Home / Self Care | Attending: Internal Medicine

## 2021-12-28 ENCOUNTER — Encounter (HOSPITAL_COMMUNITY): Payer: Self-pay | Admitting: Internal Medicine

## 2021-12-28 DIAGNOSIS — K766 Portal hypertension: Secondary | ICD-10-CM

## 2021-12-28 DIAGNOSIS — I8510 Secondary esophageal varices without bleeding: Secondary | ICD-10-CM

## 2021-12-28 DIAGNOSIS — I864 Gastric varices: Secondary | ICD-10-CM

## 2021-12-28 DIAGNOSIS — K862 Cyst of pancreas: Secondary | ICD-10-CM

## 2021-12-28 DIAGNOSIS — K863 Pseudocyst of pancreas: Secondary | ICD-10-CM | POA: Diagnosis not present

## 2021-12-28 DIAGNOSIS — F419 Anxiety disorder, unspecified: Secondary | ICD-10-CM

## 2021-12-28 DIAGNOSIS — K3189 Other diseases of stomach and duodenum: Secondary | ICD-10-CM

## 2021-12-28 HISTORY — PX: ESOPHAGOGASTRODUODENOSCOPY (EGD) WITH PROPOFOL: SHX5813

## 2021-12-28 HISTORY — PX: EUS: SHX5427

## 2021-12-28 LAB — GLUCOSE, CAPILLARY
Glucose-Capillary: 138 mg/dL — ABNORMAL HIGH (ref 70–99)
Glucose-Capillary: 151 mg/dL — ABNORMAL HIGH (ref 70–99)
Glucose-Capillary: 153 mg/dL — ABNORMAL HIGH (ref 70–99)
Glucose-Capillary: 161 mg/dL — ABNORMAL HIGH (ref 70–99)
Glucose-Capillary: 167 mg/dL — ABNORMAL HIGH (ref 70–99)
Glucose-Capillary: 187 mg/dL — ABNORMAL HIGH (ref 70–99)
Glucose-Capillary: 202 mg/dL — ABNORMAL HIGH (ref 70–99)

## 2021-12-28 SURGERY — ESOPHAGOGASTRODUODENOSCOPY (EGD) WITH PROPOFOL
Anesthesia: Monitor Anesthesia Care

## 2021-12-28 SURGERY — UPPER ENDOSCOPIC ULTRASOUND (EUS) LINEAR
Anesthesia: General

## 2021-12-28 MED ORDER — ORAL CARE MOUTH RINSE: 15.0000 mL | OROMUCOSAL | Status: DC | PRN

## 2021-12-28 MED ORDER — LACTATED RINGERS IV SOLN: INTRAVENOUS | Status: DC

## 2021-12-28 MED ORDER — CIPROFLOXACIN IN D5W 400 MG/200ML IV SOLN
INTRAVENOUS | Status: AC
  Filled 2021-12-28: qty 200

## 2021-12-28 MED ORDER — SODIUM CHLORIDE 0.9 % IV SOLN
INTRAVENOUS | Status: DC
Start: 2021-12-28 — End: 2021-12-28

## 2021-12-28 MED ORDER — PROPOFOL 10 MG/ML IV BOLUS
INTRAVENOUS | Status: DC | PRN
  Administered 2021-12-28: 30 mg via INTRAVENOUS
  Administered 2021-12-28: 20 mg via INTRAVENOUS

## 2021-12-28 MED ORDER — PROPOFOL 500 MG/50ML IV EMUL
INTRAVENOUS | Status: DC | PRN
  Administered 2021-12-28: 135 ug/kg/min via INTRAVENOUS

## 2021-12-28 MED ORDER — PROPOFOL 1000 MG/100ML IV EMUL
INTRAVENOUS | Status: AC
  Filled 2021-12-28: qty 100

## 2021-12-28 MED ORDER — PHENYLEPHRINE 80 MCG/ML (10ML) SYRINGE FOR IV PUSH (FOR BLOOD PRESSURE SUPPORT)
PREFILLED_SYRINGE | INTRAVENOUS | Status: DC | PRN
  Administered 2021-12-28: 80 ug via INTRAVENOUS

## 2021-12-28 NOTE — Progress Notes (Signed)
[facility]  PROGRESS NOTE  Mallory Stewart WUJ:[id number] DOB: 30-Apr-1961 DOA: 12/24/2021 PCP: Excell Seltzer, MD   Brief HPI:   61 year old female with medical history of diabetes mellitus type 2, hypertension, chronic hyponatremia, alcoholic cirrhosis, chronic pancreatitis, pancreatic pseudocyst, GERD, CKD stage II-III came to ED complaining of left flank pain and swelling.  CT showing interval decrease in size of a pancreatic pseudocyst within the tail of the pancreas since MRI examination 10/13/2021, however, there is now a septated, rim-enhancing fluid collection within the left posterior pararenal space measuring up to 12.9 cm in greatest dimension.  This is in continuity with the pancreatic pseudocyst and represents retroperitoneal extension of the pancreatic pseudocyst. GI and general surgery following.   Subjective   Still with pain   Assessment/Plan:   Enlarging pancreatic pseudocyst -CT showing interval decrease in size of a pancreatic pseudocyst within the tail of the pancreas since MRI examination 10/13/2021, however, there is now a septated, rim-enhancing fluid collection within the left posterior pararenal space measuring up to 12.9 cm in greatest dimension.  This is in continuity with the pancreatic pseudocyst and represents retroperitoneal extension of the pancreatic pseudocyst.  -General surgery feels that she has a stricture of the main pancreatic duct in the head of the pancreas leading to a blowout in the tail with subsequent pseudocyst formation -Pseudocyst will likely require drainage however general surgery does not feel that patient is a surgical candidate given history of liver cirrhosis and portal hypertension -Gastroenterology has been consulted for further management for enlarging pancreatic pseudocyst -Both GI and surgery following -fever overnight 8/22 along with WBC count trending up--- abx on hold -EUS 8/24  Alcoholic cirrhosis with ascites -CT  abdomen pelvis done yesterday only showed moderate volume ascites -GI following -s/p diagnostic paracentesis 8/22 and 8/23: per GI: 1 liter removed. Elevated PMN but didn't meet criteria for SBP. Culture is pending. Given worsening WBC plan is to repeat tap with fluid studies today.   Chronic hyponatremia -mild hyponatremia due to liver cirrhosis   CKD stage IIIa -Creatinine is stable    Data Reviewed:   CBG:  Recent Labs  Lab 12/27/21 2015 12/28/21 0027 12/28/21 0436 12/28/21 0754 12/28/21 1133  GLUCAP 237* 187* 153* 167* 161*    SpO2: 100 %    Vitals:   12/27/21 1842 12/27/21 2019 12/28/21 0030 12/28/21 0439  BP: 122/82 113/74 106/74 131/84  Pulse: (!) 107 94 95 (!) 106  Resp: 18 20 17 16   Temp: 99.5 F (37.5 C) 98.5 F (36.9 C) 98.2 F (36.8 C) 98.6 F (37 C)  TempSrc: Oral     SpO2: 96% 99% 100% 100%  Weight:      Height:          Data Reviewed:  Basic Metabolic Panel: Recent Labs  Lab 12/24/21 1351 12/25/21 0524 12/26/21 0535 12/27/21 0604  NA 130* 130* 131* 129*  K 4.4 4.5 4.0 5.0  CL 95* 97* 99 94*  CO2 26 24 25 25   GLUCOSE 175* 166* 93 141*  BUN 14 18 14 15   CREATININE 1.09* 1.33* 1.06* 1.08*  CALCIUM 8.8* 8.5* 8.7* 8.8*    CBC: Recent Labs  Lab 12/24/21 1351 12/25/21 0524 12/26/21 0535 12/27/21 0604  WBC 18.8* 18.0* 16.0* 22.8*  NEUTROABS 16.1*  --   --   --   HGB 9.8* 8.4* 8.7* 9.0*  HCT 29.7* 25.5* 26.7* 26.9*  MCV 89.5 89.5 89.9 88.8  PLT 302 301 269 291  LFT Recent Labs  Lab 12/24/21 1351 12/25/21 0524 12/26/21 0535 12/27/21 0604  AST 19 15 14* 21  ALT 9 8 7 11   ALKPHOS 139* 132* 112 136*  BILITOT 1.4* 0.9 1.3* 1.4*  PROT 7.0 6.0* 5.9* 6.2*  ALBUMIN 2.9* 2.5* 2.4* 2.5*      DVT prophylaxis: SCDs  Code Status: Full code  Family Communication: No family at bedside   CONSULTS gastroenterology, general surgery   Objective    Physical Examination:     General: Appearance:    Well developed,  well nourished female in no acute distress     Lungs:      respirations unlabored  Heart:    Tachycardic. Normal rhythm. No murmurs, rubs, or gallops.   MS:   All extremities are intact.   Neurologic:   Awake, alert       Status is: Inpatient:         [facility] If 7PM-7AM, please contact night-coverage at [email or web address],    12/28/2021, 1:29 PM  LOS: 4 days

## 2021-12-28 NOTE — Anesthesia Procedure Notes (Signed)
Procedure Name: MAC Date/Time: 12/28/2021 2:15 PM  Performed by: Niel Hummer, CRNAPre-anesthesia Checklist: Patient identified, Patient being monitored, Suction available and Emergency Drugs available Oxygen Delivery Method: Simple face mask

## 2021-12-28 NOTE — Anesthesia Postprocedure Evaluation (Signed)
Anesthesia Post Note  Patient: Mallory Stewart  Procedure(s) Performed: UPPER ENDOSCOPIC ULTRASOUND (EUS) LINEAR ESOPHAGOGASTRODUODENOSCOPY (EGD) WITH PROPOFOL     Patient location during evaluation: PACU Anesthesia Type: MAC Level of consciousness: awake and alert Pain management: pain level controlled Vital Signs Assessment: post-procedure vital signs reviewed and stable Respiratory status: spontaneous breathing, nonlabored ventilation and respiratory function stable Cardiovascular status: stable and blood pressure returned to baseline Anesthetic complications: no   No notable events documented.  Last Vitals:  Vitals:   12/28/21 1530 12/28/21 1540  BP: 107/69   Pulse: 84 92  Resp: (!) 23 15  Temp:    SpO2: 100% 100%    Last Pain:  Vitals:   12/28/21 1540  TempSrc:   PainSc: 0-No pain                 Audry Pili

## 2021-12-28 NOTE — Transfer of Care (Signed)
Immediate Anesthesia Transfer of Care Note  Patient: Mallory Stewart  Procedure(s) Performed: UPPER ENDOSCOPIC ULTRASOUND (EUS) LINEAR ESOPHAGOGASTRODUODENOSCOPY (EGD) WITH PROPOFOL  Patient Location: PACU  Anesthesia Type:MAC  Level of Consciousness: drowsy  Airway & Oxygen Therapy: Patient Spontanous Breathing and Patient connected to face mask oxygen  Post-op Assessment: Report given to RN, Post -op Vital signs reviewed and stable and Patient moving all extremities X 4  Post vital signs: Reviewed and stable  Last Vitals:  Vitals Value Taken Time  BP 90/59   Temp    Pulse 78   Resp 12   SpO2 100     Last Pain:  Vitals:   12/28/21 1330  TempSrc: Temporal  PainSc: 0-No pain      Patients Stated Pain Goal: 0 (24/46/28 6381)  Complications: No notable events documented.

## 2021-12-28 NOTE — Op Note (Signed)
[facility] Patient Name: Mallory Stewart Procedure Date: 12/28/2021 MRN: [id number] Attending MD: Justice Britain , MD Date of Birth: Aug 18, 1960 CSN: [id number] Age: 61 Admit Type: Outpatient Procedure:                Upper EUS Indications:              Pancreatic cyst on CT scan, Portal hypertension                            with suspected esophageal varices, Suspected                            gastric varices Providers:                Justice Britain, MD, Carlyn Reichert, RN, [facility] Technician, Technician Referring MD:             Blanchard Mane. Lesia Hausen. Pyrtle, MD Medicines:                Monitored Anesthesia Care Complications:            No immediate complications. Estimated Blood Loss:     Estimated blood loss was minimal. Estimated blood                            loss: none. Procedure:                Pre-Anesthesia Assessment:                           - Prior to the procedure, a History and Physical                            was performed, and patient medications and                            allergies were reviewed. The patient's tolerance of                            previous anesthesia was also reviewed. The risks                            and benefits of the procedure and the sedation                            options and risks were discussed with the patient.                            All questions were answered, and informed consent                            was obtained. Prior Anticoagulants: The patient has  taken no previous anticoagulant or antiplatelet                            agents. [ASA Grade]. After reviewing the risks and                            benefits, the patient was deemed in satisfactory                            condition to undergo the procedure.                           After obtaining informed consent, the endoscope was                            passed  under direct vision. Throughout the                            procedure, the patient's blood pressure, pulse, and                            oxygen saturations were monitored continuously. The                            [id number]-[id number] ([id number]) Olympus endoscope was introduced                            through the mouth, and advanced to the second part                            of duodenum. The [id number]-[id number] ([id number]) Olympus                            duodenoscope was introduced through the mouth, and                            advanced to the area of papilla. The [id number]                            ([id number]) Olympus linear ultrasound scope was                            introduced through the mouth, and advanced to the                            duodenum for ultrasound examination from the                            stomach and duodenum. Scope In: Scope Out: Findings:      ENDOSCOPIC FINDING: :      Normal mucosa was found in the esophagus.      Small grade I varices were found in the distal esophagus.      Type 1 isolated gastric varices (IGV1, varices located in the fundus)  with no bleeding were found in the gastric fundus. There were no       stigmata of recent bleeding.      Moderate portal hypertensive gastropathy was found in the entire       examined stomach.      No gross lesions were noted in the duodenal bulb, in the first portion       of the duodenum and in the second portion of the duodenum.      The major papilla was normal.      ENDOSONOGRAPHIC FINDING: :      Pancreatic parenchymal abnormalities were noted in the entire pancreas.       These consisted of atrophy, hyperechoic foci with shadowing, lobularity       without honeycombing and hyperechoic strands.      A heterogenous, mostly hypoechoic lesion suggestive of a cyst was       identified in the peripancreatic tail region. It is not in obvious       communication with the pancreatic duct. The lesion measured 98  mm by 66       mm in maximal cross-sectional diameter. There were a few compartments       The outer wall of the lesion was thin. There was no associated mass.       There was no internal debris within the fluid-filled cavity. Attempt at       needle aspiration was not felt to be safe due to the portal hypertension       driven splenic hilar varices/collaterals that were in place as well as a       splenule that was in the way of adequate window.      Endosonographic imaging in the visualized portion of the liver showed no       mass.      There was increased vascular flow and collaterals noted in the splenic       hilar region, based on endosonographic examination.      An accessory splenule was visualized endosonographically in the very       distant peripancreatic tail region.      Endosonographic imaging in the visualized portion of the spleen showed       no mass-lesion however.      A moderate amount of fluid, visualized as an anechoic feature, was found       in the peritoneal cavity.      A few enlarged lymph nodes were visualized in the peripancreatic region.       The largest measured 8 mm by 9 mm in maximal cross-sectional diameter.       The nodes were irregular, isoechoic and had well defined margins.      The celiac region was visualized. Impression:               EGD Impression:                           - Normal mucosa was found proximally in the                            esophagus. Small grade I esophageal varices                            distally.                           -  Small type 1 isolated gastric varices (IGV1,                            varices located in the fundus), without bleeding.                           - Portal hypertensive gastropathy throughout.                           - No gross lesions in the duodenal bulb, in the                            first portion of the duodenum and in the second                            portion of the duodenum.                            - Normal major papilla.                           EUS Impression:                           - Pancreatic parenchymal abnormalities consisting                            of atrophy, hyperechoic foci, lobularity and                            hyperechoic strands were noted in the entire                            pancreas. Consistent with known chronic                            pancreatitis.                           - A cystic lesion was seen in the peripancreatic                            tail region. Tissue has not been obtained. However,                            the endosonographic appearance is suggestive of                            benign inflammatory changes consistent with                            pseudocyst or inflammatory phelgmon. Unfortunately,                            as noted above, not able to acess this area safely  due to the portal collaterals and splenule in the                            region with EUS guided aspiration.                           - Increased venous collaterals noted in the region                            of the splenic hilum.                           - Ascites was found on endosonographic examination                            of the peritoneal cavity.                           - A few enlarged lymph nodes were visualized in the                            peripancreatic region. Tissue has not been                            obtained. However, the endosonographic appearance                            is suggestive of benign inflammatory changes. Moderate Sedation:      Not Applicable - Patient had care per Anesthesia. Recommendation:           - The patient will be observed post-procedure,                            until all discharge criteria are met.                           - Return patient to hospital ward for ongoing care.                           - Observe patient's clinical course.                            - Recommend updated cross-sectional imaging (CTAP                            with IV/PO contrast).                           - Recommend likely VIR consultation to see if                            aspiration attempt could be had. If this were to                            end up being true  pancreatic fluid collection with                            elevated lipase/amylase consistent with a                            pseudocyst, then may need to consider Pancreatic                            ERCP in the future to see if                            stenting/sphincterotomy may be a possibility.                           - The findings and recommendations were discussed                            with the patient.                           - The findings and recommendations were discussed                            with the referring physician. Procedure Code(s):        --- Professional ---                           [id number], Esophagogastroduodenoscopy, flexible,                            transoral; with endoscopic ultrasound examination                            limited to the esophagus, stomach or duodenum, and                            adjacent structures Diagnosis Code(s):        --- Professional ---                           K76.6, Portal hypertension                           I85.10, Secondary esophageal varices without                            bleeding                           I86.4, Gastric varices                           K31.89, Other diseases of stomach and duodenum                           K86.9, Disease of pancreas, unspecified  K86.2, Cyst of pancreas                           R18.8, Other ascites                           R59.0, Localized enlarged lymph nodes CPT copyright 2019 [employer] Medical Association. All rights reserved. The codes documented in this report are preliminary and upon coder review may  be revised to  meet current compliance requirements. Justice Britain, MD 12/28/2021 4:42:29 PM Number of Addenda: 0

## 2021-12-28 NOTE — Anesthesia Preprocedure Evaluation (Addendum)
Anesthesia Evaluation  Patient identified by MRN, date of birth, ID band Patient awake    Reviewed: Allergy & Precautions, NPO status , Patient's Chart, lab work & pertinent test results  History of Anesthesia Complications Negative for: history of anesthetic complications  Airway Mallampati: I  TM Distance: >3 FB Neck ROM: Full    Dental  (+) Dental Advisory Given   Pulmonary neg pulmonary ROS,    Pulmonary exam normal        Cardiovascular hypertension (no longer on meds), Normal cardiovascular exam     Neuro/Psych PSYCHIATRIC DISORDERS Anxiety  Vertigo   Neuromuscular disease    GI/Hepatic negative GI ROS, (+) Cirrhosis   ascites  substance abuse (sober x few years)  alcohol use,   Endo/Other  diabetes, Insulin Dependent Na 129, chronic   Renal/GU CRFRenal disease     Musculoskeletal  (+) Arthritis ,   Abdominal   Peds  Hematology  (+) Blood dyscrasia, anemia ,   Anesthesia Other Findings   Reproductive/Obstetrics                            Anesthesia Physical Anesthesia Plan  ASA: 3  Anesthesia Plan: MAC   Post-op Pain Management: Minimal or no pain anticipated   Induction:   PONV Risk Score and Plan: 2 and Propofol infusion and Treatment may vary due to age or medical condition  Airway Management Planned: Nasal Cannula and Natural Airway  Additional Equipment: None  Intra-op Plan:   Post-operative Plan:   Informed Consent: I have reviewed the patients History and Physical, chart, labs and discussed the procedure including the risks, benefits and alternatives for the proposed anesthesia with the patient or authorized representative who has indicated his/her understanding and acceptance.       Plan Discussed with: CRNA and Anesthesiologist  Anesthesia Plan Comments:        Anesthesia Quick Evaluation

## 2021-12-28 NOTE — Interval H&P Note (Signed)
History and Physical Interval Note:  12/28/2021 2:03 PM  Mallory Stewart  has presented today for surgery, with the diagnosis of Abdominal Pain, Pancreatic cyst, Cirrhosis.  The various methods of treatment have been discussed with the patient and family. After consideration of risks, benefits and other options for treatment, the patient has consented to  Procedure(s): UPPER ENDOSCOPIC ULTRASOUND (EUS) LINEAR (N/A) ESOPHAGOGASTRODUODENOSCOPY (EGD) WITH PROPOFOL (N/A) as a surgical intervention.  The patient's history has been reviewed, patient examined, no change in status, stable for surgery.  I have reviewed the patient's chart and labs.  Questions were answered to the patient's satisfaction.    The risks of an EUS including intestinal perforation, bleeding, infection, aspiration, and medication effects were discussed as was the possibility it may not give a definitive diagnosis if a biopsy is performed.  When a biopsy of the pancreas is done as part of the EUS, there is an additional risk of pancreatitis at the rate of about 1-2%.  It was explained that procedure related pancreatitis is typically mild, although it can be severe and even life threatening, which is why we do not perform random pancreatic biopsies and only biopsy a lesion/area we feel is concerning enough to warrant the risk.    [employer]

## 2021-12-28 NOTE — Plan of Care (Signed)
  Problem: Nutrition: Goal: Adequate nutrition will be maintained Outcome: Progressing   

## 2021-12-29 ENCOUNTER — Inpatient Hospital Stay (HOSPITAL_COMMUNITY): Payer: 59

## 2021-12-29 ENCOUNTER — Encounter (HOSPITAL_COMMUNITY): Payer: Self-pay | Admitting: Internal Medicine

## 2021-12-29 DIAGNOSIS — K863 Pseudocyst of pancreas: Secondary | ICD-10-CM | POA: Diagnosis not present

## 2021-12-29 LAB — BASIC METABOLIC PANEL
Anion gap: 7 (ref 5–15)
BUN: 14 mg/dL (ref 6–20)
CO2: 26 mmol/L (ref 22–32)
Calcium: 8.8 mg/dL — ABNORMAL LOW (ref 8.9–10.3)
Chloride: 97 mmol/L — ABNORMAL LOW (ref 98–111)
Creatinine, Ser: 1.02 mg/dL — ABNORMAL HIGH (ref 0.44–1.00)
GFR, Estimated: >60 mL/min (ref >60)
Glucose, Bld: 161 mg/dL — ABNORMAL HIGH (ref 70–99)
Potassium: 4.3 mmol/L (ref 3.5–5.1)
Sodium: 130 mmol/L — ABNORMAL LOW (ref 135–145)

## 2021-12-29 LAB — CBC
HCT: 26.2 % — ABNORMAL LOW (ref 36.0–46.0)
Hemoglobin: 8.7 g/dL — ABNORMAL LOW (ref 12.0–15.0)
MCH: 29.3 pg (ref 26.0–34.0)
MCHC: 33.2 g/dL (ref 30.0–36.0)
MCV: 88.2 fL (ref 80.0–100.0)
Platelets: 277 10*3/uL (ref 150–400)
RBC: 2.97 MIL/uL — ABNORMAL LOW (ref 3.87–5.11)
RDW: 13.7 % (ref 11.5–15.5)
WBC: 16.8 10*3/uL — ABNORMAL HIGH (ref 4.0–10.5)
nRBC: 0 % (ref 0.0–0.2)

## 2021-12-29 LAB — BODY FLUID CULTURE W GRAM STAIN: Culture: NO GROWTH

## 2021-12-29 LAB — PROTIME-INR
INR: 1.3 — ABNORMAL HIGH (ref 0.8–1.2)
Prothrombin Time: 15.9 seconds — ABNORMAL HIGH (ref 11.4–15.2)

## 2021-12-29 LAB — GLUCOSE, CAPILLARY
Glucose-Capillary: 123 mg/dL — ABNORMAL HIGH (ref 70–99)
Glucose-Capillary: 130 mg/dL — ABNORMAL HIGH (ref 70–99)
Glucose-Capillary: 135 mg/dL — ABNORMAL HIGH (ref 70–99)
Glucose-Capillary: 156 mg/dL — ABNORMAL HIGH (ref 70–99)
Glucose-Capillary: 167 mg/dL — ABNORMAL HIGH (ref 70–99)
Glucose-Capillary: 197 mg/dL — ABNORMAL HIGH (ref 70–99)

## 2021-12-29 MED ORDER — POLYETHYLENE GLYCOL 3350 17 G PO PACK
17.0000 g | PACK | Freq: Every day | ORAL | Status: DC
  Administered 2021-12-29 – 2021-12-31 (×3): 17 g via ORAL
  Filled 2021-12-29 (×5): qty 1

## 2021-12-29 MED ORDER — ACETAMINOPHEN 10 MG/ML IV SOLN
1000.0000 mg | Freq: Once | INTRAVENOUS | Status: AC
  Administered 2021-12-29: 1000 mg via INTRAVENOUS
  Filled 2021-12-29: qty 100

## 2021-12-29 MED ORDER — DIPHENHYDRAMINE HCL 25 MG PO CAPS
25.0000 mg | ORAL_CAPSULE | ORAL | Status: DC | PRN
  Administered 2021-12-29 – 2021-12-30 (×6): 25 mg via ORAL
  Filled 2021-12-29 (×6): qty 1

## 2021-12-29 MED ORDER — BISACODYL 5 MG PO TBEC
5.0000 mg | DELAYED_RELEASE_TABLET | Freq: Every day | ORAL | Status: DC | PRN
Start: 2021-12-29 — End: 2022-01-02
  Administered 2021-12-29 – 2021-12-31 (×2): 5 mg via ORAL
  Filled 2021-12-29 (×2): qty 1

## 2021-12-29 MED ORDER — LIDOCAINE HCL 1 % IJ SOLN
INTRAMUSCULAR | Status: AC
  Administered 2021-12-29: 10 mL
  Filled 2021-12-29: qty 20

## 2021-12-29 MED ORDER — SODIUM CHLORIDE (PF) 0.9 % IJ SOLN
INTRAMUSCULAR | Status: AC
  Filled 2021-12-29: qty 50

## 2021-12-29 MED ORDER — IOHEXOL 300 MG/ML  SOLN
100.0000 mL | Freq: Once | INTRAMUSCULAR | Status: AC | PRN
  Administered 2021-12-29: 100 mL via INTRAVENOUS

## 2021-12-29 MED ORDER — IOHEXOL 9 MG/ML PO SOLN
500.0000 mL | ORAL | Status: AC
  Administered 2021-12-29 (×2): 500 mL via ORAL

## 2021-12-29 MED ORDER — IOHEXOL 9 MG/ML PO SOLN
ORAL | Status: AC
  Filled 2021-12-29: qty 1000

## 2021-12-29 NOTE — Plan of Care (Signed)
  Problem: Clinical Measurements: Goal: Cardiovascular complication will be avoided Outcome: Progressing   

## 2021-12-29 NOTE — Progress Notes (Signed)
24 hour chart audit completed 

## 2021-12-29 NOTE — Procedures (Signed)
  Procedure:  US aspiration LUQ collection  tan opaque fluid, samples sent for GS, C&S Preprocedure diagnosis: Pancreatic pseudocyst Postprocedure diagnosis: same EBL:    minimal Complications:   none immediate  See full dictation in [employer].  Thora Lance MD Main # [phone] Pager  [phone] Mobile [phone]

## 2021-12-29 NOTE — Progress Notes (Addendum)
Attending physician's note   I have taken a history, reviewed the chart, and examined the patient. I performed a substantive portion of this encounter, including complete performance of at least one of the key components, in conjunction with the APP. I agree with the APP's note, impression, and recommendations with my edits.   Repeat CT earlier today with pseudocyst in the distal body/tail of the pancreas, slightly improved from the previous study, measuring 2.5 x 4 cm.  The larger septated, rim-enhancing fluid collection in the left posterior pararenal space measures up to 12 cm, and essentially stable from previous CT 5 days ago.  Increased size of the loculated pseudocyst component measuring up to 9.2 x 3.7 cm.  WBC improved to 16.8.  Otherwise no hemoconcentration (stable anemia) or renal dysfunction.  NA stable at 130.  Discussed EUS findings at length with the patient today.  Plan for IR consult for possible cyst aspiration.  GI service will continue to follow. Dr. Loreta Ave will cover for the weekend. Please reach out with any questions or concerns.   Melven Stockard, DO, FACG [phone] office          Progress Note  Primary GI: Dr. Leone Payor   Subjective  Chief Complaint: Enlarging pseudocyst, left flank pain  Patient lying in bed drinking CT contrast, supposed to go into hours. Patient's frustrated with being in the hospital for a prolonged period. Discussed EUS findings yesterday, plan for CT and possible IR intervention pending CT results. Patient denies nausea, vomiting, abdominal pain, pain is mainly left flank. Had another fever last night 101. Shortness of breath, chest pain, urinary symptoms.    Objective   Vital signs in last 24 hours: Temp:  [97.3 F (36.3 C)-101.6 F (38.7 C)] 98.1 F (36.7 C) (08/25 0745) Pulse Rate:  [71-110] 101 (08/25 0745) Resp:  [12-23] 16 (08/25 0745) BP: (87-119)/(56-92) 98/62 (08/25 0745) SpO2:  [94 %-100 %] 99 % (08/25  0745) Weight:  [64.4 kg] 64.4 kg (08/24 1330) Last BM Date : 12/25/21 Last BM recorded by nurses in past 5 days No data recorded  General:   female in no acute distress  Heart:  Regular rate and rhythm; no murmurs Pulm: Clear anteriorly; no wheezing Abdomen:  Soft, Flat AB, Active bowel sounds. No tenderness . Without guarding and Without rebound, No organomegaly appreciated.  Possible small reaccumulation of fluid lower abdomen with dependent shifting dullness.  Left flank with large smooth tender protrusion, likely retroperitoneal cyst. Extremities:  without  edema, no jaundice Neurologic:  Alert and  oriented x4;  No focal deficits.  No clonus, asterixis Psych:  Cooperative. Normal mood and affect.  Intake/Output from previous day: 08/24 0701 - 08/25 0700 In: 526 [P.O.:120; I.V.:306; IV Piggyback:100] Out: -  Intake/Output this shift: No intake/output data recorded.  Studies/Results: US Paracentesis  Result Date: 12/27/2021 INDICATION: Patient with history of pancreatic pseudocyst, chronic pancreatitis, alcoholic cirrhosis, ascites, chronic kidney disease. Request received for diagnostic paracentesis up to 500 cc. EXAM: ULTRASOUND GUIDED DIAGNOSTIC PARACENTESIS MEDICATIONS: 8 mL 1% lidocaine COMPLICATIONS: None immediate. PROCEDURE: Informed written consent was obtained from the patient after a discussion of the risks, benefits and alternatives to treatment. A timeout was performed prior to the initiation of the procedure. Initial ultrasound scanning demonstrates a small to moderate amount of ascites within the right upper to mid abdominal quadrant. The right upper to mid abdomen was prepped and draped in the usual sterile fashion. 1% lidocaine was used for local anesthesia. Following  this, a 19 gauge, 7-cm, Yueh catheter was introduced. An ultrasound image was saved for documentation purposes. The paracentesis was performed. The catheter was removed and a dressing was applied. The patient  tolerated the procedure well without immediate post procedural complication. FINDINGS: A total of approximately 500 cc of yellow fluid was removed. Samples were sent to the laboratory as requested by the clinical team. IMPRESSION: Successful ultrasound-guided diagnostic paracentesis yielding 500 cc of peritoneal fluid. PLAN: If the patient eventually requires >/=2 paracenteses in a 30 day period, candidacy for formal evaluation by the [facility] will be assessed. Read by: Jeananne Rama, PA-C Electronically Signed   By: Judie Petit.  Shick M.D.   On: 12/27/2021 14:01    Lab Results: Recent Labs    12/27/21 0604 12/29/21 0539  WBC 22.8* 16.8*  HGB 9.0* 8.7*  HCT 26.9* 26.2*  PLT 291 277   BMET Recent Labs    12/27/21 0604 12/29/21 0539  NA 129* 130*  K 5.0 4.3  CL 94* 97*  CO2 25 26  GLUCOSE 141* 161*  BUN 15 14  CREATININE 1.08* 1.02*  CALCIUM 8.8* 8.8*   LFT Recent Labs    12/27/21 0604  PROT 6.2*  ALBUMIN 2.5*  AST 21  ALT 11  ALKPHOS 136*  BILITOT 1.4*   PT/INR No results for input(s): "LABPROT", "INR" in the last 72 hours.    Patient profile:   61 year old female with alcoholic cirrhosis portal hypertension ascites, chronic alcoholic pancreatitis complicated by pseudocyst   Impression/Plan:   Chronic alcoholic pancreatitis complicated by pseudocyst with back pain CA 19-9 normal at 15 S/p EUS Dr. Meridee Score 08/24  Small grade 1 varices distal esophagus, type I gastric varices without bleeding gastric fundus, moderate portal hypertensive gastropathy on the EUS atrophy, hyperechoic foci, known chronic pancreatitis.  Cystic lesion benign inflammatory changes consistent with pseudocyst however unable to access area safely feel enlarged lymph nodes peripancreatic region appearance suggestive of benign inflammatory changes. Plan to repeat CT abdomen pelvis with contrast if able we will consult IR to see if they are able to  aspirate. Consider pancreatic ERCP in the future   ETOH cirrhosis with history of portal HTN, ascites HGB 8.7 Platelets 277 AST 21 ALT 11  Alkphos 136 TBili 1.4 GFR >60  INR 12/13/2021 1.5 - will repeat INR to calculate more current MELD, last MELD 21 on 08/09 HCC screening 12/24/21 no mass, repeat 6 months S/p paracentesis 08/22 1 liter removed. Elevated PMN but didn't meet criteria for SBP, negative cultures. S/p repeat paracentesis for elevated WBC/fever- 500 cc removed, elevated PMN but under 250, preliminary cultures negative EGD 12/2020 Type 1 isolated gastric varices- coreg discontinued due to hypotension Keep appointment with [facility] next month  AKI BUN 14 Cr 1.02  GFR >60  Potassium 4.3  Magnesium 1.6  Diuretics on hold Getting CT with IV contrast today, repeat renal function in the morning, will consider adding back on diuretics tomorrow. Continue low-sodium diet, monitor weight daily  Leukocytosis 12/29/2021 WBC 16.8 (22.8) -downtrending leukocytosis 12/24/2021 Lactic Acid 2.9  Blood pressure 98/62, pulse (!) 101, temperature 98.1 F (36.7 C), temperature source Oral, resp. rate 16, height 5\' 9"  (1.753 m), weight 64.4 kg, SpO2 99 %.         LOS: 5 days    12/29/2021, 7:56 AM

## 2021-12-29 NOTE — Progress Notes (Signed)
[facility]  PROGRESS NOTE  Mallory Stewart SWN:[id number] DOB: Feb 07, 1961 DOA: 12/24/2021 PCP: Excell Seltzer, MD   Brief HPI:   61 year old female with medical history of diabetes mellitus type 2, hypertension, chronic hyponatremia, alcoholic cirrhosis, chronic pancreatitis, pancreatic pseudocyst, GERD, CKD stage II-III came to ED complaining of left flank pain and swelling.  CT showing interval decrease in size of a pancreatic pseudocyst within the tail of the pancreas since MRI examination 10/13/2021, however, there is now a septated, rim-enhancing fluid collection within the left posterior pararenal space measuring up to 12.9 cm in greatest dimension.  This is in continuity with the pancreatic pseudocyst and represents retroperitoneal extension of the pancreatic pseudocyst. GI and general surgery following. 8/25- getting CT scan and possible IR involvement    Subjective   Flank pain severe   Assessment/Plan:   Enlarging pancreatic pseudocyst -CT showing interval decrease in size of a pancreatic pseudocyst within the tail of the pancreas since MRI examination 10/13/2021, however, there is now a septated, rim-enhancing fluid collection within the left posterior pararenal space measuring up to 12.9 cm in greatest dimension.  This is in continuity with the pancreatic pseudocyst and represents retroperitoneal extension of the pancreatic pseudocyst.  -General surgery feels that she has a stricture of the main pancreatic duct in the head of the pancreas leading to a blowout in the tail with subsequent pseudocyst formation -Pseudocyst will likely require drainage however general surgery does not feel that patient is a surgical candidate given history of liver cirrhosis and portal hypertension -Gastroenterology has been consulted for further management for enlarging pancreatic pseudocyst -Both GI and surgery following -fever overnight 8/22 along with WBC count trending up--- abx on hold -EUS  8/24 not successful- plan to repeat CT abd and then get IR involved   Alcoholic cirrhosis with ascites -CT abdomen pelvis done yesterday only showed moderate volume ascites -GI following -s/p diagnostic paracentesis 8/22 and 8/23: per GI: 1 liter removed. Elevated PMN but didn't meet criteria for SBP. Culture is pending. Given worsening WBC plan is to repeat tap with fluid studies today.   Chronic hyponatremia -mild hyponatremia due to liver cirrhosis  CKD stage IIIa -Creatinine is stable    Data Reviewed:   CBG:  Recent Labs  Lab 12/28/21 2033 12/29/21 0004 12/29/21 0357 12/29/21 0741 12/29/21 1215  GLUCAP 202* 197* 167* 156* 135*    SpO2: 96 % O2 Flow Rate (L/min): 8 L/min    Vitals:   12/29/21 0401 12/29/21 0603 12/29/21 0745 12/29/21 1209  BP: 108/69 (!) 104/58 98/62 107/77  Pulse: (!) 110 (!) 108 (!) 101 100  Resp: 18 19 16 16   Temp: (!) 101.6 F (38.7 C) (!) 100.5 F (38.1 C) 98.1 F (36.7 C) 98.1 F (36.7 C)  TempSrc: Oral  Oral Oral  SpO2: 94% 96% 99% 96%  Weight:      Height:          Data Reviewed:  Basic Metabolic Panel: Recent Labs  Lab 12/24/21 1351 12/25/21 0524 12/26/21 0535 12/27/21 0604 12/29/21 0539  NA 130* 130* 131* 129* 130*  K 4.4 4.5 4.0 5.0 4.3  CL 95* 97* 99 94* 97*  CO2 26 24 25 25 26   GLUCOSE 175* 166* 93 141* 161*  BUN 14 18 14 15 14   CREATININE 1.09* 1.33* 1.06* 1.08* 1.02*  CALCIUM 8.8* 8.5* 8.7* 8.8* 8.8*    CBC: Recent Labs  Lab 12/24/21 1351 12/25/21 0524 12/26/21 0535 12/27/21 0604 12/29/21 0539  WBC 18.8* 18.0* 16.0* 22.8* 16.8*  NEUTROABS 16.1*  --   --   --   --   HGB 9.8* 8.4* 8.7* 9.0* 8.7*  HCT 29.7* 25.5* 26.7* 26.9* 26.2*  MCV 89.5 89.5 89.9 88.8 88.2  PLT 302 301 269 291 277    LFT Recent Labs  Lab 12/24/21 1351 12/25/21 0524 12/26/21 0535 12/27/21 0604  AST 19 15 14* 21  ALT 9 8 7 11   ALKPHOS 139* 132* 112 136*  BILITOT 1.4* 0.9 1.3* 1.4*  PROT 7.0 6.0* 5.9* 6.2*  ALBUMIN 2.9*  2.5* 2.4* 2.5*      DVT prophylaxis: SCDs  Code Status: Full code  Family Communication: No family at bedside   CONSULTS gastroenterology, general surgery   Objective    Physical Examination:     General: Appearance:    Well developed, well nourished female in no acute distress     Lungs:      respirations unlabored  Heart:    Tachycardic  MS:   All extremities are intact.   Neurologic:   Awake, alert       Status is: Inpatient:         [facility] If 7PM-7AM, please contact night-coverage at [email or web address],    12/29/2021, 12:36 PM  LOS: 5 days

## 2021-12-30 DIAGNOSIS — D72829 Elevated white blood cell count, unspecified: Secondary | ICD-10-CM

## 2021-12-30 DIAGNOSIS — K860 Alcohol-induced chronic pancreatitis: Secondary | ICD-10-CM

## 2021-12-30 DIAGNOSIS — K863 Pseudocyst of pancreas: Secondary | ICD-10-CM | POA: Diagnosis not present

## 2021-12-30 DIAGNOSIS — R188 Other ascites: Secondary | ICD-10-CM

## 2021-12-30 LAB — BASIC METABOLIC PANEL
Anion gap: 9 (ref 5–15)
BUN: 14 mg/dL (ref 6–20)
CO2: 26 mmol/L (ref 22–32)
Calcium: 8.3 mg/dL — ABNORMAL LOW (ref 8.9–10.3)
Chloride: 94 mmol/L — ABNORMAL LOW (ref 98–111)
Creatinine, Ser: 1.08 mg/dL — ABNORMAL HIGH (ref 0.44–1.00)
GFR, Estimated: 60 mL/min — ABNORMAL LOW (ref >60)
Glucose, Bld: 166 mg/dL — ABNORMAL HIGH (ref 70–99)
Potassium: 4.2 mmol/L (ref 3.5–5.1)
Sodium: 129 mmol/L — ABNORMAL LOW (ref 135–145)

## 2021-12-30 LAB — CBC
HCT: 26.5 % — ABNORMAL LOW (ref 36.0–46.0)
Hemoglobin: 8.6 g/dL — ABNORMAL LOW (ref 12.0–15.0)
MCH: 29.2 pg (ref 26.0–34.0)
MCHC: 32.5 g/dL (ref 30.0–36.0)
MCV: 89.8 fL (ref 80.0–100.0)
Platelets: 256 10*3/uL (ref 150–400)
RBC: 2.95 MIL/uL — ABNORMAL LOW (ref 3.87–5.11)
RDW: 13.8 % (ref 11.5–15.5)
WBC: 12.1 10*3/uL — ABNORMAL HIGH (ref 4.0–10.5)
nRBC: 0 % (ref 0.0–0.2)

## 2021-12-30 LAB — GLUCOSE, CAPILLARY
Glucose-Capillary: 145 mg/dL — ABNORMAL HIGH (ref 70–99)
Glucose-Capillary: 149 mg/dL — ABNORMAL HIGH (ref 70–99)
Glucose-Capillary: 161 mg/dL — ABNORMAL HIGH (ref 70–99)
Glucose-Capillary: 176 mg/dL — ABNORMAL HIGH (ref 70–99)
Glucose-Capillary: 179 mg/dL — ABNORMAL HIGH (ref 70–99)
Glucose-Capillary: 187 mg/dL — ABNORMAL HIGH (ref 70–99)
Glucose-Capillary: 188 mg/dL — ABNORMAL HIGH (ref 70–99)

## 2021-12-30 LAB — CULTURE, BLOOD (ROUTINE X 2)
Culture: NO GROWTH
Culture: NO GROWTH
Special Requests: ADEQUATE
Special Requests: ADEQUATE

## 2021-12-30 LAB — BODY FLUID CULTURE W GRAM STAIN: Culture: NO GROWTH

## 2021-12-30 MED ORDER — VANCOMYCIN HCL 125 MG PO CAPS
125.0000 mg | ORAL_CAPSULE | Freq: Two times a day (BID) | ORAL | Status: DC
  Administered 2021-12-30 – 2022-01-02 (×7): 125 mg via ORAL
  Filled 2021-12-30 (×7): qty 1

## 2021-12-30 MED ORDER — METRONIDAZOLE 500 MG/100ML IV SOLN
500.0000 mg | Freq: Two times a day (BID) | INTRAVENOUS | Status: DC
  Administered 2021-12-30 – 2022-01-02 (×7): 500 mg via INTRAVENOUS
  Filled 2021-12-30 (×7): qty 100

## 2021-12-30 MED ORDER — SODIUM CHLORIDE 0.9 % IV SOLN
2.0000 g | Freq: Every day | INTRAVENOUS | Status: DC
  Administered 2021-12-30 – 2022-01-02 (×4): 2 g via INTRAVENOUS
  Filled 2021-12-30 (×4): qty 20

## 2021-12-30 NOTE — Consult Note (Addendum)
[facility]    Date of Admission:  12/24/2021   Total days of inpatient antibiotics 0        Reason for Consult: Infected pseudocyst    Principal Problem:   Pancreatic pseudocyst Active Problems:   Hypertension associated with diabetes (HCC)   Chronic hyponatremia   Alcoholic cirrhosis of liver with ascites (HCC)   SIRS (systemic inflammatory response syndrome) (HCC)   Left flank pain   Assessment: 61 YF admitted with:   #Infected pancreatic pseudocyst #Chronic alcoholic pancreatitis with grade 1 gastric varices #Ascites status post paracentesis #Leukocytosis-trending down #Fevers - 6 days prior to admission then developing left flank pain after catching herself while about to fall.  On arrival to the ED she had WBC of 18 K.  CT showed rim-enhancing septated fluid collection in left posterior pararenal space measuring 12 cm. Interval decrease in pancreatic tail pseudocyst - General surgery was consulted, patient felt not to be a surgical candidate given her comorbidities. -GI engaged and she underwent EGD found to have varices. Repeat CT on 8/25  showed  pararenal space measuring 12cm without interval change with left posterior parietal wall loculated pseudocyst increased in size measuring 9.2 x 3.7 cm (previously 7x2.5cm) -GI recommended IR engagement for aspiration as patient continued fever. Pt underwent US FNA of left upper quadrant retroperitoneal collection with 330 ml opaque tan collection Cx+ GNR. - Patient underwent paracentesis on 8/22 and 8/23 with PMN less than 250 and negative cultures  Recommendations:  -Start ceftriaxone and metronidazole given recent bout of cdiff while on cirpo/augmentin will use ctx, Although I suspect c diff was 2/2 cipro) -Narrow antibioics pending Cx and sens -Anticipate about 2 week course   #Recent C. difficile treated with fidaxomicin x10 days - Patient was treated for E. coli UTI (7/17)with  ciprofloxacin in July 2023->Augmentin after 4 days as she had sinusiitis->diarrhea on day 3 of augmetin.  She developed diarrhea C. difficile PCR positive.  She was prescribed 10 days of fidaxomicin. - As such we will start prophylaxis with p.o. bank twice daily while on antibiotics in 7 days following completion of course. Plan: -Denies diarrhea today -Start vanc PO bid for secondary PPX, continue 7 days following completion of antibiotics  Microbiology:   Antibiotics: NA  Cultures: 8/25 OR cx GNR   HPI: Mallory Stewart is a 61 y.o. female with past medical history of type 2 diabetes, hypertension,, hypertension, chronic hyponatremia, alcoholic cirrhosis, chronic pancreatitis, pancreatic pseudocyst/cyst, GERD, CKD, presented to the ED with left flank pain and swollen.  She was admitted for enlarging pancreatic pseudocyst SIRS criteria.  6 days prior to admission caught her herself after she almost fell.  Noted to have left flank swelling, thought she pulled a muscle.  Pain got significantly worse.  Noted that she was sick with a UTI and respiratory infection, antibiotics were started and she developed C. difficile.  No diarrhea reported at time of admission.  On arrival to the ED WBC 18 K.  CT showed septated rim-enhancing fluid collection in the left posterior pararenal measuring up to 12.9 cm.  General surgery was consulted, do not feel that patient is a surgical candidate given history of liver cirrhosis and portal hypertension.  Patient continued to fever in the setting of enlarging pseudocyst.  GI was engaged and patient underwent EGD on 8/24 showing grade 1 varices.  And recommended IR to aspirate pseudocyst, consider ERCP in the future.  She  underwent percutaneous drainage with IR of pseudocyst on 8/25.  ID engaged as cultures growing GNR.   Review of Systems: Review of Systems  All other systems reviewed and are negative.   Past Medical History:  Diagnosis Date   Alcohol-induced  chronic pancreatitis (HCC)    Alcoholic cirrhosis (HCC)    B12 deficiency    Concussion    DDD (degenerative disc disease), cervical    Diabetes ([place])    DKA, type 2 ([place])    Gastric outlet obstruction 08/12/2018   Hypertension    Neuropathy    Pancreatic pseudocyst/cyst 05/06/2013   Seasonal allergies     Social History   Tobacco Use   Smoking status: Never   Smokeless tobacco: Never  Vaping Use   Vaping Use: Never used  Substance Use Topics   Alcohol use: Not Currently    Alcohol/week: 7.0 standard drinks of alcohol    Types: 7 Glasses of wine per week    Comment: wine   Drug use: No    Family History  Problem Relation Age of Onset   Other Mother        tachycardia.Marland KitchenMarland Kitchen?afib   Stroke Father        after hernia suegery   Prostate cancer Father    Other Father        global transient amnesia, unclear source   Atrial fibrillation Sister    Healthy Brother    Healthy Brother    Coronary artery disease Paternal Grandmother    Heart attack Paternal Grandmother 61   Brain cancer Maternal Grandfather        ?   Cancer Paternal Grandfather        ?   Breast cancer Maternal Grandmother    Scheduled Meds:  insulin aspart  0-9 Units Subcutaneous Q4H   pantoprazole  40 mg Oral BID   polyethylene glycol  17 g Oral Daily   Continuous Infusions: PRN Meds:.bisacodyl, diphenhydrAMINE, mouth rinse, oxyCODONE Allergies  Allergen Reactions   Augmentin [Amoxicillin-Pot Clavulanate] Dermatitis   Gluten Meal Other (See Comments)    Stomach distress   Zocor [Simvastatin] Other (See Comments)    Elevated lfts?    OBJECTIVE: Blood pressure 107/70, pulse 88, temperature (!) 97.5 F (36.4 C), temperature source Oral, resp. rate 18, height 5\' 9"  (1.753 m), weight 64.4 kg, SpO2 97 %.  Physical Exam Constitutional:      Appearance: Normal appearance.  HENT:     Head: Normocephalic and atraumatic.     Right Ear: Tympanic membrane normal.     Left Ear: Tympanic membrane  normal.     Nose: Nose normal.     Mouth/Throat:     Mouth: Mucous membranes are moist.  Eyes:     Extraocular Movements: Extraocular movements intact.     Conjunctiva/sclera: Conjunctivae normal.     Pupils: Pupils are equal, round, and reactive to light.  Cardiovascular:     Rate and Rhythm: Normal rate and regular rhythm.     Heart sounds: No murmur heard.    No friction rub. No gallop.  Pulmonary:     Effort: Pulmonary effort is normal.     Breath sounds: Normal breath sounds.  Abdominal:     General: Abdomen is flat.     Palpations: Abdomen is soft.  Musculoskeletal:        General: Normal range of motion.  Skin:    General: Skin is warm and dry.  Neurological:     General: No focal deficit  present.     Mental Status: She is alert and oriented to person, place, and time.  Psychiatric:        Mood and Affect: Mood normal.     Lab Results Lab Results  Component Value Date   WBC 12.1 (H) 12/30/2021   HGB 8.6 (L) 12/30/2021   HCT 26.5 (L) 12/30/2021   MCV 89.8 12/30/2021   PLT 256 12/30/2021    Lab Results  Component Value Date   CREATININE 1.08 (H) 12/30/2021   BUN 14 12/30/2021   NA 129 (L) 12/30/2021   K 4.2 12/30/2021   CL 94 (L) 12/30/2021   CO2 26 12/30/2021    Lab Results  Component Value Date   ALT 11 12/27/2021   AST 21 12/27/2021   ALKPHOS 136 (H) 12/27/2021   BILITOT 1.4 (H) 12/27/2021       Danelle Earthly, MD [facility] 12/30/2021, 10:43 AM

## 2021-12-30 NOTE — Progress Notes (Signed)
[facility]  PROGRESS NOTE  Mallory Stewart [id number]:[id number] DOB: 1960-12-24 DOA: 12/24/2021 PCP: Excell Seltzer, MD   Brief HPI:   61 year old female with medical history of diabetes mellitus type 2, hypertension, chronic hyponatremia, alcoholic cirrhosis, chronic pancreatitis, pancreatic pseudocyst, GERD, CKD stage II-III came to ED complaining of left flank pain and swelling.  CT showing interval decrease in size of a pancreatic pseudocyst within the tail of the pancreas since MRI examination 10/13/2021, however, there is now a septated, rim-enhancing fluid collection within the left posterior pararenal space measuring up to 12.9 cm in greatest dimension.  This is in continuity with the pancreatic pseudocyst and represents retroperitoneal extension of the pancreatic pseudocyst. GI and general surgery following. 8/25- s/p aspiration with cultures showing gram negative rods.     Subjective   Feels like area on flank is enlarging    Assessment/Plan:   Enlarging pancreatic pseudocyst -CT showing interval decrease in size of a pancreatic pseudocyst within the tail of the pancreas since MRI examination 10/13/2021, however, there is now a septated, rim-enhancing fluid collection within the left posterior pararenal space measuring up to 12.9 cm in greatest dimension.  This is in continuity with the pancreatic pseudocyst and represents retroperitoneal extension of the pancreatic pseudocyst.  -General surgery feels that she has a stricture of the main pancreatic duct in the head of the pancreas leading to a blowout in the tail with subsequent pseudocyst formation -Pseudocyst will likely require drainage however general surgery does not feel that patient is a surgical candidate given history of liver cirrhosis and portal hypertension -Gastroenterology has been consulted for further management for enlarging pancreatic pseudocyst -Both GI and surgery following -fever overnight 8/22 along with WBC  count trending up--- abx on hold -EUS 8/24 not successful- -IR aspirated 300 cc- gram negative rods-- discussed with ID-- they will see, starting flagyl and rocephin  Alcoholic cirrhosis with ascites -CT abdomen pelvis done yesterday only showed moderate volume ascites -GI following -s/p diagnostic paracentesis 8/22 and 8/23: per GI: 1 liter removed. Elevated PMN but didn't meet criteria for SBP. Culture is NGTD  Chronic hyponatremia -mild hyponatremia due to liver cirrhosis  CKD stage IIIa -Creatinine is stable    Data Reviewed:   CBG:  Recent Labs  Lab 12/29/21 2002 12/29/21 2359 12/30/21 0449 12/30/21 0813 12/30/21 1214  GLUCAP 123* 179* 161* 145* 188*    SpO2: 97 % O2 Flow Rate (L/min): 8 L/min    Vitals:   12/29/21 1629 12/29/21 1718 12/29/21 2000 12/30/21 0447  BP: 112/75 118/81 120/81 107/70  Pulse:  100 (!) 109 88  Resp:  16 18 18   Temp:  98.5 F (36.9 C) 98.1 F (36.7 C) (!) 97.5 F (36.4 C)  TempSrc:  Oral Oral Oral  SpO2:  100% 100% 97%  Weight:      Height:          Data Reviewed:  Basic Metabolic Panel: Recent Labs  Lab 12/25/21 0524 12/26/21 0535 12/27/21 0604 12/29/21 0539 12/30/21 0617  NA 130* 131* 129* 130* 129*  K 4.5 4.0 5.0 4.3 4.2  CL 97* 99 94* 97* 94*  CO2 24 25 25 26 26   GLUCOSE 166* 93 141* 161* 166*  BUN 18 14 15 14 14   CREATININE 1.33* 1.06* 1.08* 1.02* 1.08*  CALCIUM 8.5* 8.7* 8.8* 8.8* 8.3*    CBC: Recent Labs  Lab 12/24/21 1351 12/25/21 0524 12/26/21 0535 12/27/21 0604 12/29/21 0539 12/30/21 0617  WBC 18.8* 18.0* 16.0* 22.8*  16.8* 12.1*  NEUTROABS 16.1*  --   --   --   --   --   HGB 9.8* 8.4* 8.7* 9.0* 8.7* 8.6*  HCT 29.7* 25.5* 26.7* 26.9* 26.2* 26.5*  MCV 89.5 89.5 89.9 88.8 88.2 89.8  PLT 302 301 269 291 277 256    LFT Recent Labs  Lab 12/24/21 1351 12/25/21 0524 12/26/21 0535 12/27/21 0604  AST 19 15 14* 21  ALT 9 8 7 11   ALKPHOS 139* 132* 112 136*  BILITOT 1.4* 0.9 1.3* 1.4*  PROT  7.0 6.0* 5.9* 6.2*  ALBUMIN 2.9* 2.5* 2.4* 2.5*      DVT prophylaxis: SCDs  Code Status: Full code  Family Communication: No family at bedside   CONSULTS gastroenterology, general surgery, ID, IR   Objective    Physical Examination:    General: Appearance:    Well developed, well nourished female in no acute distress     Lungs:      respirations unlabored  Heart:    Normal heart rate.    MS:   All extremities are intact.   Neurologic:   Awake, alert         Status is: Inpatient:         [facility] If 7PM-7AM, please contact night-coverage at [email or web address],    12/30/2021, 12:58 PM  LOS: 6 days

## 2021-12-30 NOTE — Plan of Care (Signed)
  Problem: Activity: Goal: Risk for activity intolerance will decrease Outcome: Progressing   

## 2021-12-30 NOTE — Progress Notes (Signed)
CROSS [facility]OVER [facility] Subjective: Patient seen and examined chart reviewed patient.  Patient denies any abdominal pain whatsoever she is having a lot of pain in her back.  Repeat CT showed pseudocyst in the distal body tail of the pancreas with slight improvement from the previous study and patient is awaiting IR consult for cyst aspiration.  Objective: Vital signs in last 24 hours: Temp:  [97.5 F (36.4 C)-98.5 F (36.9 C)] 97.5 F (36.4 C) (08/26 0447) Pulse Rate:  [88-109] 88 (08/26 0447) Resp:  [16-18] 18 (08/26 0447) BP: (98-120)/(62-81) 107/70 (08/26 0447) SpO2:  [96 %-100 %] 97 % (08/26 0447) Last BM Date : 12/25/21  Intake/Output from previous day: 08/25 0701 - 08/26 0700 In: 1240 [P.O.:1240] Out: -  Intake/Output this shift: No intake/output data recorded.  General appearance: alert, cooperative, and no distress Resp: clear to auscultation bilaterally Cardio: regular rate and rhythm, S1, S2 normal, no murmur, click, rub or gallop GI: soft, non-tender; bowel sounds normal; no masses,  no organomegaly Soft protrusion in the lower left flank which is tender on palpation  Lab Results: Recent Labs    12/29/21 0539 12/30/21 0617  WBC 16.8* 12.1*  HGB 8.7* 8.6*  HCT 26.2* 26.5*  PLT 277 256   BMET Recent Labs    12/29/21 0539  NA 130*  K 4.3  CL 97*  CO2 26  GLUCOSE 161*  BUN 14  CREATININE 1.02*  CALCIUM 8.8*   LFT No results for input(s): "PROT", "ALBUMIN", "AST", "ALT", "ALKPHOS", "BILITOT", "BILIDIR", "IBILI" in the last 72 hours. PT/INR Recent Labs    12/29/21 0829  LABPROT 15.9*  INR 1.3*   Studies/Results: [place] FINE NEEDLE ASP 1ST LESION  Result Date: 12/29/2021 INDICATION: Left flank pain. Enlarging left upper quadrant retroperitoneal pancreatic pseudocyst, possibly involving body wall EXAM: ULTRASOUND ASPIRATION MEDICATIONS: No periprocedural antibiotics were indicated ANESTHESIA/SEDATION: Lidocaine 1% subcutaneous COMPLICATIONS: None immediate.  PROCEDURE: Informed written consent was obtained from the patient after a thorough discussion of the procedural risks, benefits and alternatives. All questions were addressed. Maximal Sterile Barrier Technique was utilized including caps, mask, sterile gowns, sterile gloves, sterile drape, hand hygiene and skin antiseptic. A timeout was performed prior to the initiation of the procedure. Ultrasound of the left upper quadrant retroperitoneum was performed and the complex collection was localized corresponding to CT findings. An appropriate skin entry site was determined and marked. The skin prepped with chlorhexidine, draped in usual sterile fashion, infiltrated locally with 1% lidocaine. 10 cm 5 [place] multi sidehole Yueh sheath needle advanced into the collection. 330 mL opaque tan fluid were aspirated, sample sent for the requested laboratory studies. Postprocedure scans show significant decompression of the complex collection with no significant large residual component evident on ultrasound. The patient tolerated the procedure well. IMPRESSION: 1. Technically successful ultrasound-guided aspiration of left upper quadrant retroperitoneal collection, removing 330 mL opaque tan fluid, sent for requested laboratory studies. Electronically Signed   By: Lucrezia Europe M.D.   On: 12/29/2021 17:09   CT ABDOMEN PELVIS W CONTRAST  Result Date: 12/29/2021 CLINICAL DATA:  pancreatic pseudocyst, follow-up study EXAM: CT ABDOMEN AND PELVIS WITH CONTRAST TECHNIQUE: Multidetector CT imaging of the abdomen and pelvis was performed using the standard protocol following bolus administration of intravenous contrast. RADIATION DOSE REDUCTION: This exam was performed according to the departmental dose-optimization program which includes automated exposure control, adjustment of the mA and/or kV according to patient size and/or use of iterative reconstruction technique. CONTRAST:  173m OMNIPAQUE IOHEXOL 300 MG/ML  SOLN COMPARISON:   December 24, 2021 FINDINGS: Lower chest: Previously seen small left pleural effusion and left basilar atelectasis has significantly improved in the interim. Again seen are the extensive coronary artery atheromatous calcifications. Hepatobiliary: Hepatic surface is nodular and lobulated and these morphological changes in keeping with cirrhosis. No discrete mass lesion is identified. Status post cholecystectomy. No intrahepatic biliary dilatation. Again seen is the severe dilatation of the CBD measuring approximately 1.7 cm, tapering within the pancreatic head likely related to changes of chronic pancreatitis and reservoir effect of cholecystectomy. Pancreas: Moderate pancreatic parenchymal atrophy. Intraparenchymal loculated fluid collection at the distal body and tail of the pancreas in keeping with a pancreatic pseudocyst measuring approximately 2.5 x 4 cm in comparison to 2.6 x 4.2 cm on the previous study. This fluid collection is in continuity with the fluid collection in the left posterior pararenal space which represent retroperitoneal/posterior parietal wall extension of the pancreatic pseudocyst. This rim enhancing septated collection measures approximately 9.1 x 7.5 cm in comparison to 8.8 x 8 cm on the previous study. The fluid pocket extending into the posterior parietal wall measures 9.2 x 3.7 cm (image 48/2) in comparison to 7 x 2.5 cm on the previous study and has increased. Spleen: Mild splenomegaly. Adrenals/Urinary Tract: Adrenal glands are unremarkable. Kidneys are normal, without renal calculi, focal lesion, or hydronephrosis except for the anterior and medial displacement of the left kidney of pseudocyst of the pancreas, stable. Bladder is unremarkable. Stomach/Bowel: Stomach is within normal limits. Appendix appears normal. No evidence of bowel wall thickening, distention, or inflammatory changes. Moderately large stool burden in the colon. Vascular/Lymphatic: Aortic atherosclerosis. No enlarged  abdominal or pelvic lymph nodes. Reproductive: Uterus and bilateral adnexa are unremarkable. Other: There is a moderately large abdominopelvic ascites seen without significant interval change. Musculoskeletal: Mild lumbar spondylosis.  No fracture seen. IMPRESSION: 1. Again seen are the findings of cirrhosis of the liver and portal hypertension with small gastric varices, moderately large ascites and mild splenomegaly without significant interval change. 2. The pseudocyst at the distal body-tail of the pancreas has minimally decreased in the interim. The septated, rim enhancing fluid collection within the left posterior pararenal space measuring up to 12 cm in the greatest craniocaudad dimension is without significant interval change. The loculated pseudocyst component extending into the left posterior parietal wall has increased in the interim measuring 9.2 x 3.7 cm in comparison to 7 x 2.5 cm on the previous study. This is pseudocyst of the pancreas is amenable for a drain insertion in the appropriate clinical setting. 3.  Moderately large stool burden in the colon of constipation. 4.  Aortic atherosclerosis (ICD10-I70.0) Electronically Signed   By: Frazier Richards M.D.   On: 12/29/2021 12:51    Medications: I have reviewed the patient's current medications. Prior to Admission:  Medications Prior to Admission  Medication Sig Dispense Refill Last Dose   aspirin EC 81 MG EC tablet Take 1 tablet (81 mg total) by mouth 2 (two) times daily. Swallow whole. 30 tablet 11 12/24/2021   cetirizine (ZYRTEC) 10 MG tablet Take 10 mg by mouth at bedtime.   12/23/2021   furosemide (LASIX) 20 MG tablet Take 1 tablet (20 mg total) by mouth daily. (Patient taking differently: Take 20 mg by mouth every other day.) 90 tablet 1 12/24/2021   Insulin Glargine (BASAGLAR KWIKPEN) 100 UNIT/ML Inject 36 Units into the skin daily. 45 mL 3 12/24/2021   insulin lispro (HUMALOG KWIKPEN) 100 UNIT/ML KwikPen Max Daily 30 units (Patient  taking  differently: Inject 6-10 Units into the skin 3 (three) times daily as needed (high blood sugar). Max Daily 30 units. Sliding Scale) 30 mL 1 12/24/2021   meclizine (ANTIVERT) 25 MG tablet Take 1/2 tablet po qhs prn vertigo (Patient taking differently: Take 25 mg by mouth as needed (vertigo).) 20 tablet 0 Past Month   pantoprazole (PROTONIX) 40 MG tablet TAKE 1 TABLET BY MOUTH TWICE DAILY BEFORE A MEAL (Patient taking differently: Take 40 mg by mouth 2 (two) times daily.) 180 tablet 1 12/24/2021   amitriptyline (ELAVIL) 75 MG tablet Take 1 tablet (75 mg total) by mouth at bedtime. (Patient not taking: Reported on 12/25/2021) 30 tablet 5 Not Taking   Blood Glucose Monitoring Suppl (ONE TOUCH ULTRA 2) w/Device KIT Use to check blood sugars two times day 1 each 0    FEROSUL 325 (65 Fe) MG tablet TAKE 1 TABLET(325 MG) BY MOUTH TWICE DAILY WITH A MEAL (Patient not taking: Reported on 12/25/2021) 60 tablet 3 Not Taking   glucose blood (ONETOUCH ULTRA) test strip CHECK BLOOD SUGAR TWICE DAILY AS DIRECTED 100 strip 3    Insulin Pen Needle 32G X 4 MM MISC 1 Device by Does not apply route in the morning, at noon, in the evening, and at bedtime. 400 each 3    Lancets (ONETOUCH ULTRASOFT) lancets Use to check blood sugar two times a day. 200 each 3    Multiple Vitamin (MULTIVITAMIN WITH MINERALS) TABS tablet Take 1 tablet by mouth daily. (Patient not taking: Reported on 12/25/2021)   Not Taking   Scheduled:  insulin aspart  0-9 Units Subcutaneous Q4H   pantoprazole  40 mg Oral BID   polyethylene glycol  17 g Oral Daily   Continuous:  Assessment/Plan: 1) Chronic alcoholic pancreatitis complicated with a pancreatic pseudocyst with back pain; ascites; s/p EUS per Dr. Collene Mares started 12/28/2021 revealed small grade 1 distal esophageal varices and type I gastric varices in the fundus with moderate portal hypertensive gastropathy and chronic pancreatitis changes in the body of the pancreas; excess was not clearly identified  visualized due to enlarged peripancreatic lymph nodes.  2) AKI.  LOS: 6 days   Juanita Craver 12/30/2021, 7:27 AM

## 2021-12-31 ENCOUNTER — Encounter (HOSPITAL_COMMUNITY): Payer: Self-pay | Admitting: Gastroenterology

## 2021-12-31 DIAGNOSIS — R109 Unspecified abdominal pain: Secondary | ICD-10-CM | POA: Diagnosis not present

## 2021-12-31 DIAGNOSIS — K860 Alcohol-induced chronic pancreatitis: Secondary | ICD-10-CM | POA: Diagnosis not present

## 2021-12-31 DIAGNOSIS — E1159 Type 2 diabetes mellitus with other circulatory complications: Secondary | ICD-10-CM | POA: Diagnosis not present

## 2021-12-31 DIAGNOSIS — R188 Other ascites: Secondary | ICD-10-CM | POA: Diagnosis not present

## 2021-12-31 DIAGNOSIS — K863 Pseudocyst of pancreas: Secondary | ICD-10-CM | POA: Diagnosis not present

## 2021-12-31 DIAGNOSIS — D72829 Elevated white blood cell count, unspecified: Secondary | ICD-10-CM | POA: Diagnosis not present

## 2021-12-31 DIAGNOSIS — K7031 Alcoholic cirrhosis of liver with ascites: Secondary | ICD-10-CM | POA: Diagnosis not present

## 2021-12-31 LAB — COMPREHENSIVE METABOLIC PANEL
ALT: 7 U/L (ref 0–44)
AST: 12 U/L — ABNORMAL LOW (ref 15–41)
Albumin: 2.3 g/dL — ABNORMAL LOW (ref 3.5–5.0)
Alkaline Phosphatase: 118 U/L (ref 38–126)
Anion gap: 8 (ref 5–15)
BUN: 12 mg/dL (ref 6–20)
CO2: 26 mmol/L (ref 22–32)
Calcium: 8.8 mg/dL — ABNORMAL LOW (ref 8.9–10.3)
Chloride: 95 mmol/L — ABNORMAL LOW (ref 98–111)
Creatinine, Ser: 1.07 mg/dL — ABNORMAL HIGH (ref 0.44–1.00)
GFR, Estimated: >60 mL/min (ref >60)
Glucose, Bld: 155 mg/dL — ABNORMAL HIGH (ref 70–99)
Potassium: 4.4 mmol/L (ref 3.5–5.1)
Sodium: 129 mmol/L — ABNORMAL LOW (ref 135–145)
Total Bilirubin: 1.3 mg/dL — ABNORMAL HIGH (ref 0.3–1.2)
Total Protein: 6 g/dL — ABNORMAL LOW (ref 6.5–8.1)

## 2021-12-31 LAB — GLUCOSE, CAPILLARY
Glucose-Capillary: 148 mg/dL — ABNORMAL HIGH (ref 70–99)
Glucose-Capillary: 152 mg/dL — ABNORMAL HIGH (ref 70–99)
Glucose-Capillary: 159 mg/dL — ABNORMAL HIGH (ref 70–99)
Glucose-Capillary: 162 mg/dL — ABNORMAL HIGH (ref 70–99)
Glucose-Capillary: 174 mg/dL — ABNORMAL HIGH (ref 70–99)
Glucose-Capillary: 261 mg/dL — ABNORMAL HIGH (ref 70–99)

## 2021-12-31 LAB — CBC
HCT: 27.6 % — ABNORMAL LOW (ref 36.0–46.0)
Hemoglobin: 9 g/dL — ABNORMAL LOW (ref 12.0–15.0)
MCH: 29 pg (ref 26.0–34.0)
MCHC: 32.6 g/dL (ref 30.0–36.0)
MCV: 89 fL (ref 80.0–100.0)
Platelets: 265 10*3/uL (ref 150–400)
RBC: 3.1 MIL/uL — ABNORMAL LOW (ref 3.87–5.11)
RDW: 13.8 % (ref 11.5–15.5)
WBC: 14.8 10*3/uL — ABNORMAL HIGH (ref 4.0–10.5)
nRBC: 0 % (ref 0.0–0.2)

## 2021-12-31 NOTE — Plan of Care (Signed)
  Problem: Clinical Measurements: Goal: Will remain free from infection Outcome: Progressing   Problem: Nutrition: Goal: Adequate nutrition will be maintained Outcome: Progressing   Problem: Elimination: Goal: Will not experience complications related to bowel motility Outcome: Progressing   

## 2021-12-31 NOTE — Progress Notes (Signed)
[facility]  Date of Admission:  12/24/2021   Total days of inpatient antibiotics 1  Principal Problem:   Pancreatic pseudocyst Active Problems:   Hypertension associated with diabetes (HCC)   Chronic hyponatremia   Alcoholic cirrhosis of liver with ascites (HCC)   SIRS (systemic inflammatory response syndrome) (HCC)   Left flank pain          Assessment: 60 YF admitted with:   #Infected pancreatic pseudocyst SP aspiration on 8/25 with Cx+ Klebsiella pneumoniae #Chronic alcoholic pancreatitis with grade 1 gastric varices #Ascites status post paracentesis #Leukocytosis-trending down #Fevers - 6 days prior to admission then developing left flank pain after catching herself while about to fall.  On arrival to the ED she had WBC of 18 K.  CT showed rim-enhancing septated fluid collection in left posterior pararenal space measuring 12 cm. Interval decrease in pancreatic tail pseudocyst - General surgery was consulted, patient felt not to be a surgical candidate given her comorbidities. -GI engaged and she underwent EGD found to have varices. Repeat CT on 8/25  showed  pararenal space measuring 12cm without interval change with left posterior parietal wall loculated pseudocyst increased in size measuring 9.2 x 3.7 cm (previously 7x2.5cm) -GI recommended IR engagement for aspiration as patient continued fever. Pt underwent US FNA of left upper quadrant retroperitoneal collection with 330 ml opaque tan collection Cx+ GNR. - Patient underwent paracentesis on 8/22 and 8/23 with PMN less than 250 and negative cultures   Recommendations:  -Start ceftriaxone and metronidazole given recent bout of cdiff while on cirpo/augmentin will use ctx, Although I suspect c diff was 2/2 cipro) -Follow Cx till final, anticipate she can transition to cefadroxil+ metronidazole PO for discharge x 2 weeks from aspiration     #Recent C. difficile treated with fidaxomicin x10  days - Patient was treated for E. coli UTI (7/17)with ciprofloxacin in July 2023->Augmentin after 4 days as she had sinusiitis->diarrhea on day 3 of augmetin.  She developed diarrhea C. difficile PCR positive.  She was prescribed 10 days of fidaxomicin. - As such we will start prophylaxis with p.o. bank twice daily while on antibiotics in 7 days following completion of course. Plan: -Denies diarrhea today -Start vanc PO bid for secondary PPX, continue 7 days following completion of antibiotics  Microbiology:   Antibiotics: Cts and metro 8/26- PO vanc 8/26-   Cultures: 8/25 OR cx kleb pneumo    SUBJECTIVE: Resting in bed. Reports she feels better overall. Overnight: Afebrile wbc 14.8k  Review of Systems: Review of Systems  All other systems reviewed and are negative.    Scheduled Meds:  insulin aspart  0-9 Units Subcutaneous Q4H   pantoprazole  40 mg Oral BID   polyethylene glycol  17 g Oral Daily   vancomycin  125 mg Oral Q12H   Continuous Infusions:  cefTRIAXone (ROCEPHIN)  IV Stopped (12/31/21 1040)   metronidazole Stopped (12/31/21 1220)   PRN Meds:.bisacodyl, diphenhydrAMINE, mouth rinse, oxyCODONE Allergies  Allergen Reactions   Augmentin [Amoxicillin-Pot Clavulanate] Dermatitis   Gluten Meal Other (See Comments)    Stomach distress   Zocor [Simvastatin] Other (See Comments)    Elevated lfts?    OBJECTIVE: Vitals:   12/30/21 1900 12/30/21 2157 12/31/21 0300 12/31/21 1416  BP: 119/73 107/65 112/72 107/82  Pulse: (!) 116 (!) 101 98 94  Resp: 20 18 18 18   Temp: 98.9 F (37.2 C) 99.5 F (37.5 C) 98.2 F (36.8 C)  97.8 F (36.6 C)  TempSrc: Oral Oral Oral Oral  SpO2: 100% 94% 97% 100%  Weight:      Height:       Body mass index is 20.97 kg/m.  Physical Exam Constitutional:      Appearance: Normal appearance.  HENT:     Head: Normocephalic and atraumatic.     Right Ear: Tympanic membrane normal.     Left Ear: Tympanic membrane normal.     Nose:  Nose normal.     Mouth/Throat:     Mouth: Mucous membranes are moist.  Eyes:     Extraocular Movements: Extraocular movements intact.     Conjunctiva/sclera: Conjunctivae normal.     Pupils: Pupils are equal, round, and reactive to light.  Cardiovascular:     Rate and Rhythm: Normal rate and regular rhythm.     Heart sounds: No murmur heard.    No friction rub. No gallop.  Pulmonary:     Effort: Pulmonary effort is normal.     Breath sounds: Normal breath sounds.  Abdominal:     General: Abdomen is flat.     Palpations: Abdomen is soft.  Musculoskeletal:        General: Normal range of motion.  Skin:    General: Skin is warm and dry.  Neurological:     General: No focal deficit present.     Mental Status: She is alert and oriented to person, place, and time.  Psychiatric:        Mood and Affect: Mood normal.       Lab Results Lab Results  Component Value Date   WBC 14.8 (H) 12/31/2021   HGB 9.0 (L) 12/31/2021   HCT 27.6 (L) 12/31/2021   MCV 89.0 12/31/2021   PLT 265 12/31/2021    Lab Results  Component Value Date   CREATININE 1.07 (H) 12/31/2021   BUN 12 12/31/2021   NA 129 (L) 12/31/2021   K 4.4 12/31/2021   CL 95 (L) 12/31/2021   CO2 26 12/31/2021    Lab Results  Component Value Date   ALT 7 12/31/2021   AST 12 (L) 12/31/2021   ALKPHOS 118 12/31/2021   BILITOT 1.3 (H) 12/31/2021        Danelle Earthly, MD [facility] 12/31/2021, 7:53 PM

## 2021-12-31 NOTE — Progress Notes (Signed)
[facility] Subjective: Since I last evaluated the patient, she seems to be doing somewhat better today and the fluctuance and discomfort in the left flank has not significantly improved. She denies having abdominal pain nausea or vomiting. She looks more comfortable and rested today. White blood cell count slightly up to 14.8 K today but the patient's appears well clinically  Objective: Vital signs in last 24 hours: Temp:  [98.2 F (36.8 C)-99.5 F (37.5 C)] 98.2 F (36.8 C) (08/27 0300) Pulse Rate:  [98-117] 98 (08/27 0300) Resp:  [18-20] 18 (08/27 0300) BP: (107-132)/(65-82) 112/72 (08/27 0300) SpO2:  [82 %-100 %] 97 % (08/27 0300) Last BM Date : 12/25/21  Intake/Output from previous day: 08/26 0701 - 08/27 0700 In: 1310 [P.O.:1010; IV Piggyback:300] Out: -  Intake/Output this shift: Total I/O In: 540 [P.O.:240; IV Piggyback:300] Out: -   General appearance: alert, cooperative, and no distress Resp: clear to auscultation bilaterally Cardio: regular rate and rhythm, S1, S2 normal, no murmur, click, rub or gallop GI: soft, non-tender; bowel sounds normal; no masses,  no organomegaly  Lab Results: Recent Labs    12/29/21 0539 12/30/21 0617 12/31/21 0548  WBC 16.8* 12.1* 14.8*  HGB 8.7* 8.6* 9.0*  HCT 26.2* 26.5* 27.6*  PLT 277 256 265   BMET Recent Labs    12/29/21 0539 12/30/21 0617 12/31/21 0548  NA 130* 129* 129*  K 4.3 4.2 4.4  CL 97* 94* 95*  CO2 $Re'26 26 26  'PUL$ GLUCOSE 161* 166* 155*  BUN $Re'14 14 12  'TkU$ CREATININE 1.02* 1.08* 1.07*  CALCIUM 8.8* 8.3* 8.8*   LFT Recent Labs    12/31/21 0548  PROT 6.0*  ALBUMIN 2.3*  AST 12*  ALT 7  ALKPHOS 118  BILITOT 1.3*   PT/INR Recent Labs    12/29/21 0829  LABPROT 15.9*  INR 1.3*   Studies/Results: [place] FINE NEEDLE ASP 1ST LESION  Result Date: 12/29/2021 INDICATION: Left flank pain. Enlarging left upper quadrant retroperitoneal pancreatic pseudocyst, possibly involving body wall EXAM: ULTRASOUND  ASPIRATION MEDICATIONS: No periprocedural antibiotics were indicated ANESTHESIA/SEDATION: Lidocaine 1% subcutaneous COMPLICATIONS: None immediate. PROCEDURE: Informed written consent was obtained from the patient after a thorough discussion of the procedural risks, benefits and alternatives. All questions were addressed. Maximal Sterile Barrier Technique was utilized including caps, mask, sterile gowns, sterile gloves, sterile drape, hand hygiene and skin antiseptic. A timeout was performed prior to the initiation of the procedure. Ultrasound of the left upper quadrant retroperitoneum was performed and the complex collection was localized corresponding to CT findings. An appropriate skin entry site was determined and marked. The skin prepped with chlorhexidine, draped in usual sterile fashion, infiltrated locally with 1% lidocaine. 10 cm 5 [place] multi sidehole Yueh sheath needle advanced into the collection. 330 mL opaque tan fluid were aspirated, sample sent for the requested laboratory studies. Postprocedure scans show significant decompression of the complex collection with no significant large residual component evident on ultrasound. The patient tolerated the procedure well. IMPRESSION: 1. Technically successful ultrasound-guided aspiration of left upper quadrant retroperitoneal collection, removing 330 mL opaque tan fluid, sent for requested laboratory studies. Electronically Signed   By: Lucrezia Europe M.D.   On: 12/29/2021 17:09   CT ABDOMEN PELVIS W CONTRAST  Result Date: 12/29/2021 CLINICAL DATA:  pancreatic pseudocyst, follow-up study EXAM: CT ABDOMEN AND PELVIS WITH CONTRAST TECHNIQUE: Multidetector CT imaging of the abdomen and pelvis was performed using the standard protocol following bolus administration of intravenous contrast. RADIATION DOSE REDUCTION: This exam  was performed according to the departmental dose-optimization program which includes automated exposure control, adjustment of the mA  and/or kV according to patient size and/or use of iterative reconstruction technique. CONTRAST:  124mL OMNIPAQUE IOHEXOL 300 MG/ML  SOLN COMPARISON:  December 24, 2021 FINDINGS: Lower chest: Previously seen small left pleural effusion and left basilar atelectasis has significantly improved in the interim. Again seen are the extensive coronary artery atheromatous calcifications. Hepatobiliary: Hepatic surface is nodular and lobulated and these morphological changes in keeping with cirrhosis. No discrete mass lesion is identified. Status post cholecystectomy. No intrahepatic biliary dilatation. Again seen is the severe dilatation of the CBD measuring approximately 1.7 cm, tapering within the pancreatic head likely related to changes of chronic pancreatitis and reservoir effect of cholecystectomy. Pancreas: Moderate pancreatic parenchymal atrophy. Intraparenchymal loculated fluid collection at the distal body and tail of the pancreas in keeping with a pancreatic pseudocyst measuring approximately 2.5 x 4 cm in comparison to 2.6 x 4.2 cm on the previous study. This fluid collection is in continuity with the fluid collection in the left posterior pararenal space which represent retroperitoneal/posterior parietal wall extension of the pancreatic pseudocyst. This rim enhancing septated collection measures approximately 9.1 x 7.5 cm in comparison to 8.8 x 8 cm on the previous study. The fluid pocket extending into the posterior parietal wall measures 9.2 x 3.7 cm (image 48/2) in comparison to 7 x 2.5 cm on the previous study and has increased. Spleen: Mild splenomegaly. Adrenals/Urinary Tract: Adrenal glands are unremarkable. Kidneys are normal, without renal calculi, focal lesion, or hydronephrosis except for the anterior and medial displacement of the left kidney of pseudocyst of the pancreas, stable. Bladder is unremarkable. Stomach/Bowel: Stomach is within normal limits. Appendix appears normal. No evidence of bowel wall  thickening, distention, or inflammatory changes. Moderately large stool burden in the colon. Vascular/Lymphatic: Aortic atherosclerosis. No enlarged abdominal or pelvic lymph nodes. Reproductive: Uterus and bilateral adnexa are unremarkable. Other: There is a moderately large abdominopelvic ascites seen without significant interval change. Musculoskeletal: Mild lumbar spondylosis.  No fracture seen. IMPRESSION: 1. Again seen are the findings of cirrhosis of the liver and portal hypertension with small gastric varices, moderately large ascites and mild splenomegaly without significant interval change. 2. The pseudocyst at the distal body-tail of the pancreas has minimally decreased in the interim. The septated, rim enhancing fluid collection within the left posterior pararenal space measuring up to 12 cm in the greatest craniocaudad dimension is without significant interval change. The loculated pseudocyst component extending into the left posterior parietal wall has increased in the interim measuring 9.2 x 3.7 cm in comparison to 7 x 2.5 cm on the previous study. This is pseudocyst of the pancreas is amenable for a drain insertion in the appropriate clinical setting. 3.  Moderately large stool burden in the colon of constipation. 4.  Aortic atherosclerosis (ICD10-I70.0) Electronically Signed   By: Frazier Richards M.D.   On: 12/29/2021 12:51    Medications: I have reviewed the patient's current medications. Prior to Admission:  Medications Prior to Admission  Medication Sig Dispense Refill Last Dose   aspirin EC 81 MG EC tablet Take 1 tablet (81 mg total) by mouth 2 (two) times daily. Swallow whole. 30 tablet 11 12/24/2021   cetirizine (ZYRTEC) 10 MG tablet Take 10 mg by mouth at bedtime.   12/23/2021   furosemide (LASIX) 20 MG tablet Take 1 tablet (20 mg total) by mouth daily. (Patient taking differently: Take 20 mg by mouth every other day.)  90 tablet 1 12/24/2021   Insulin Glargine (BASAGLAR KWIKPEN) 100  UNIT/ML Inject 36 Units into the skin daily. 45 mL 3 12/24/2021   insulin lispro (HUMALOG KWIKPEN) 100 UNIT/ML KwikPen Max Daily 30 units (Patient taking differently: Inject 6-10 Units into the skin 3 (three) times daily as needed (high blood sugar). Max Daily 30 units. Sliding Scale) 30 mL 1 12/24/2021   meclizine (ANTIVERT) 25 MG tablet Take 1/2 tablet po qhs prn vertigo (Patient taking differently: Take 25 mg by mouth as needed (vertigo).) 20 tablet 0 Past Month   pantoprazole (PROTONIX) 40 MG tablet TAKE 1 TABLET BY MOUTH TWICE DAILY BEFORE A MEAL (Patient taking differently: Take 40 mg by mouth 2 (two) times daily.) 180 tablet 1 12/24/2021   amitriptyline (ELAVIL) 75 MG tablet Take 1 tablet (75 mg total) by mouth at bedtime. (Patient not taking: Reported on 12/25/2021) 30 tablet 5 Not Taking   Blood Glucose Monitoring Suppl (ONE TOUCH ULTRA 2) w/Device KIT Use to check blood sugars two times day 1 each 0    FEROSUL 325 (65 Fe) MG tablet TAKE 1 TABLET(325 MG) BY MOUTH TWICE DAILY WITH A MEAL (Patient not taking: Reported on 12/25/2021) 60 tablet 3 Not Taking   glucose blood (ONETOUCH ULTRA) test strip CHECK BLOOD SUGAR TWICE DAILY AS DIRECTED 100 strip 3    Insulin Pen Needle 32G X 4 MM MISC 1 Device by Does not apply route in the morning, at noon, in the evening, and at bedtime. 400 each 3    Lancets (ONETOUCH ULTRASOFT) lancets Use to check blood sugar two times a day. 200 each 3    Multiple Vitamin (MULTIVITAMIN WITH MINERALS) TABS tablet Take 1 tablet by mouth daily. (Patient not taking: Reported on 12/25/2021)   Not Taking   Scheduled:  insulin aspart  0-9 Units Subcutaneous Q4H   pantoprazole  40 mg Oral BID   polyethylene glycol  17 g Oral Daily   vancomycin  125 mg Oral Q12H   Continuous:  cefTRIAXone (ROCEPHIN)  IV 2 g (12/31/21 0277)   metronidazole Stopped (12/30/21 2227)   AJO:INOMVEHMC, diphenhydrAMINE, mouth rinse, oxyCODONE  Assessment/Plan: 1) Alcoholic cirrhosis with chronic  alcoholic pancreatitis complicated by pseudocyst with back pain-the cyst is now septated, rim-enhancing fluid collection within the left posterior pararenal space measuring 12.9 cm in greatest dimension; portal hypertension and ascites status post paracentesis-seems to be doing well today; [facility] to resume care tomorrow appreciate ID's input.  2) AODM. 3) HTN. 4) CKD Stage III A. 5) Chronic hyponatremia.  LOS: 7 days   Juanita Craver 12/31/2021, 6:44 AM

## 2021-12-31 NOTE — Progress Notes (Signed)
[facility]  PROGRESS NOTE  Mallory Stewart EQA:[id number] DOB: Nov 07, 1960 DOA: 12/24/2021 PCP: Excell Seltzer, MD   Brief HPI:   61 year old female with medical history of diabetes mellitus type 2, hypertension, chronic hyponatremia, alcoholic cirrhosis, chronic pancreatitis, pancreatic pseudocyst, GERD, CKD stage II-III came to ED complaining of left flank pain and swelling.  CT showing interval decrease in size of a pancreatic pseudocyst within the tail of the pancreas since MRI examination 10/13/2021, however, there is now a septated, rim-enhancing fluid collection within the left posterior pararenal space measuring up to 12.9 cm in greatest dimension.  This is in continuity with the pancreatic pseudocyst and represents retroperitoneal extension of the pancreatic pseudocyst. GI and general surgery following. 8/25- s/p aspiration with cultures showing gram negative rods-- now Klebsiella.  IV abx started 8/26.   Subjective   Flank is feeling better   Assessment/Plan:   Enlarging pancreatic pseudocyst -CT showing interval decrease in size of a pancreatic pseudocyst within the tail of the pancreas since MRI examination 10/13/2021, however, there is now a septated, rim-enhancing fluid collection within the left posterior pararenal space measuring up to 12.9 cm in greatest dimension.  This is in continuity with the pancreatic pseudocyst and represents retroperitoneal extension of the pancreatic pseudocyst.  -General surgery feels that she has a stricture of the main pancreatic duct in the head of the pancreas leading to a blowout in the tail with subsequent pseudocyst formation -Pseudocyst will likely require drainage however general surgery does not feel that patient is a surgical candidate given history of liver cirrhosis and portal hypertension -Gastroenterology has been consulted for further management for enlarging pancreatic pseudocyst -Both GI and surgery following -fever overnight  8/22 along with WBC count trending up--- abx on hold -EUS 8/24 not successful- -IR aspirated 300 cc- gram negative rods-- discussed with ID-- they will see, starting flagyl and rocephin  Recent h/o c-diff -PO vanc  Alcoholic cirrhosis with ascites -CT abdomen pelvis done yesterday only showed moderate volume ascites -GI following -s/p diagnostic paracentesis 8/22 and 8/23: per GI: 1 liter removed. Elevated PMN but didn't meet criteria for SBP. Culture is NGTD  Chronic hyponatremia -mild hyponatremia due to liver cirrhosis  CKD stage IIIa -Creatinine is stable    Data Reviewed:   CBG:  Recent Labs  Lab 12/30/21 2041 12/30/21 2354 12/31/21 0302 12/31/21 0811 12/31/21 1201  GLUCAP 187* 176* 159* 148* 152*    SpO2: 97 % O2 Flow Rate (L/min): 8 L/min    Vitals:   12/30/21 1742 12/30/21 1900 12/30/21 2157 12/31/21 0300  BP: 122/70 119/73 107/65 112/72  Pulse: (!) 117 (!) 116 (!) 101 98  Resp: 18 20 18 18   Temp: 99.3 F (37.4 C) 98.9 F (37.2 C) 99.5 F (37.5 C) 98.2 F (36.8 C)  TempSrc: Oral Oral Oral Oral  SpO2: 98% 100% 94% 97%  Weight:      Height:          Data Reviewed:  Basic Metabolic Panel: Recent Labs  Lab 12/26/21 0535 12/27/21 0604 12/29/21 0539 12/30/21 0617 12/31/21 0548  NA 131* 129* 130* 129* 129*  K 4.0 5.0 4.3 4.2 4.4  CL 99 94* 97* 94* 95*  CO2 25 25 26 26 26   GLUCOSE 93 141* 161* 166* 155*  BUN 14 15 14 14 12   CREATININE 1.06* 1.08* 1.02* 1.08* 1.07*  CALCIUM 8.7* 8.8* 8.8* 8.3* 8.8*    CBC: Recent Labs  Lab 12/24/21 1351 12/25/21 0524 12/26/21 0535 12/27/21  1751 12/29/21 0539 12/30/21 0617 12/31/21 0548  WBC 18.8*   < > 16.0* 22.8* 16.8* 12.1* 14.8*  NEUTROABS 16.1*  --   --   --   --   --   --   HGB 9.8*   < > 8.7* 9.0* 8.7* 8.6* 9.0*  HCT 29.7*   < > 26.7* 26.9* 26.2* 26.5* 27.6*  MCV 89.5   < > 89.9 88.8 88.2 89.8 89.0  PLT 302   < > 269 291 277 256 265   < > = values in this interval not displayed.     LFT Recent Labs  Lab 12/24/21 1351 12/25/21 0524 12/26/21 0535 12/27/21 0604 12/31/21 0548  AST 19 15 14* 21 12*  ALT 9 8 7 11 7   ALKPHOS 139* 132* 112 136* 118  BILITOT 1.4* 0.9 1.3* 1.4* 1.3*  PROT 7.0 6.0* 5.9* 6.2* 6.0*  ALBUMIN 2.9* 2.5* 2.4* 2.5* 2.3*      DVT prophylaxis: SCDs  Code Status: Full code  Family Communication: No family at bedside   CONSULTS gastroenterology, general surgery, ID, IR   Objective    Physical Examination:     General: Appearance:    Well developed, well nourished female in no acute distress     Lungs:      respirations unlabored  Heart:    Normal heart rate.   MS:   All extremities are intact.   Neurologic:   Awake, alert           Status is: Inpatient:         [facility] If 7PM-7AM, please contact night-coverage at [email or web address],    12/31/2021, 12:06 PM  LOS: 7 days

## 2022-01-01 ENCOUNTER — Telehealth (HOSPITAL_COMMUNITY): Payer: Self-pay | Admitting: Pharmacy Technician

## 2022-01-01 ENCOUNTER — Other Ambulatory Visit (HOSPITAL_COMMUNITY): Payer: Self-pay

## 2022-01-01 ENCOUNTER — Encounter (HOSPITAL_COMMUNITY): Payer: Self-pay | Admitting: Internal Medicine

## 2022-01-01 ENCOUNTER — Inpatient Hospital Stay (HOSPITAL_COMMUNITY): Payer: 59

## 2022-01-01 DIAGNOSIS — B961 Klebsiella pneumoniae [K. pneumoniae] as the cause of diseases classified elsewhere: Secondary | ICD-10-CM

## 2022-01-01 DIAGNOSIS — K863 Pseudocyst of pancreas: Secondary | ICD-10-CM | POA: Diagnosis not present

## 2022-01-01 DIAGNOSIS — K7031 Alcoholic cirrhosis of liver with ascites: Secondary | ICD-10-CM | POA: Diagnosis not present

## 2022-01-01 LAB — GLUCOSE, CAPILLARY
Glucose-Capillary: 220 mg/dL — ABNORMAL HIGH (ref 70–99)
Glucose-Capillary: 215 mg/dL — ABNORMAL HIGH (ref 70–99)
Glucose-Capillary: 226 mg/dL — ABNORMAL HIGH (ref 70–99)
Glucose-Capillary: 215 mg/dL — ABNORMAL HIGH (ref 70–99)
Glucose-Capillary: 195 mg/dL — ABNORMAL HIGH (ref 70–99)

## 2022-01-01 LAB — CBC
HCT: 29.6 % — ABNORMAL LOW (ref 36.0–46.0)
Hemoglobin: 9.6 g/dL — ABNORMAL LOW (ref 12.0–15.0)
MCH: 28.9 pg (ref 26.0–34.0)
MCHC: 32.4 g/dL (ref 30.0–36.0)
MCV: 89.2 fL (ref 80.0–100.0)
Platelets: 274 10*3/uL (ref 150–400)
RBC: 3.32 MIL/uL — ABNORMAL LOW (ref 3.87–5.11)
RDW: 14.1 % (ref 11.5–15.5)
WBC: 14.6 10*3/uL — ABNORMAL HIGH (ref 4.0–10.5)
nRBC: 0 % (ref 0.0–0.2)

## 2022-01-01 MED ORDER — SODIUM CHLORIDE (PF) 0.9 % IJ SOLN
INTRAMUSCULAR | Status: AC
  Filled 2022-01-01: qty 50

## 2022-01-01 MED ORDER — TRAMADOL HCL 50 MG PO TABS
50.0000 mg | ORAL_TABLET | Freq: Four times a day (QID) | ORAL | Status: DC | PRN
  Administered 2022-01-01 – 2022-01-02 (×5): 50 mg via ORAL
  Filled 2022-01-01 (×5): qty 1

## 2022-01-01 MED ORDER — IOHEXOL 300 MG/ML  SOLN
100.0000 mL | Freq: Once | INTRAMUSCULAR | Status: AC | PRN
  Administered 2022-01-01: 100 mL via INTRAVENOUS

## 2022-01-01 NOTE — Progress Notes (Signed)
[facility]  PROGRESS NOTE  Mallory Stewart HKV:[id number] DOB: 1960-11-27 DOA: 12/24/2021 PCP: Excell Seltzer, MD   Brief HPI:   61 year old female with medical history of diabetes mellitus type 2, hypertension, chronic hyponatremia, alcoholic cirrhosis, chronic pancreatitis, pancreatic pseudocyst, GERD, CKD stage II-III came to ED complaining of left flank pain and swelling.  CT showing interval decrease in size of a pancreatic pseudocyst within the tail of the pancreas since MRI examination 10/13/2021, however, there is now a septated, rim-enhancing fluid collection within the left posterior pararenal space measuring up to 12.9 cm in greatest dimension.  This is in continuity with the pancreatic pseudocyst and represents retroperitoneal extension of the pancreatic pseudocyst. GI and general surgery following. 8/25- s/p aspiration with cultures showing gram negative rods-- now Klebsiella.  IV abx started 8/26.   Subjective   Flank more tender and round mass more pronounced today   Assessment/Plan:   Enlarging pancreatic pseudocyst -CT showing interval decrease in size of a pancreatic pseudocyst within the tail of the pancreas since MRI examination 10/13/2021, however, there is now a septated, rim-enhancing fluid collection within the left posterior pararenal space measuring up to 12.9 cm in greatest dimension.  This is in continuity with the pancreatic pseudocyst and represents retroperitoneal extension of the pancreatic pseudocyst.  -General surgery feels that she has a stricture of the main pancreatic duct in the head of the pancreas leading to a blowout in the tail with subsequent pseudocyst formation -Pseudocyst will likely require drainage however general surgery does not feel that patient is a surgical candidate given history of liver cirrhosis and portal hypertension -Gastroenterology has been consulted for further management for enlarging pancreatic pseudocyst -appreciate GI  consult -EUS 8/24 not successful- -IR aspirated 300 cc--- klebsiella--  ID following for abx -mass on flank more pronounced today-- will repeat CT scan  Recent h/o c-diff -PO vanc  Alcoholic cirrhosis with ascites -CT abdomen pelvis done yesterday only showed moderate volume ascites -GI following -s/p diagnostic paracentesis 8/22 and 8/23: per GI: 1 liter removed. Elevated PMN but didn't meet criteria for SBP. Culture is NGTD  Chronic hyponatremia -mild hyponatremia due to liver cirrhosis  CKD stage IIIa -Creatinine is stable    Data Reviewed:   CBG:  Recent Labs  Lab 12/31/21 1811 12/31/21 1951 12/31/21 2348 01/01/22 0347 01/01/22 0817  GLUCAP 162* 174* 261* 220* 215*    SpO2: 99 % O2 Flow Rate (L/min): 8 L/min    Vitals:   12/31/21 0300 12/31/21 1416 12/31/21 2115 01/01/22 0646  BP: 112/72 107/82 103/73 104/72  Pulse: 98 94 87 87  Resp: 18 18 18    Temp: 98.2 F (36.8 C) 97.8 F (36.6 C) 99 F (37.2 C) 97.9 F (36.6 C)  TempSrc: Oral Oral Oral Oral  SpO2: 97% 100% 97% 99%  Weight:      Height:          Data Reviewed:  Basic Metabolic Panel: Recent Labs  Lab 12/26/21 0535 12/27/21 0604 12/29/21 0539 12/30/21 0617 12/31/21 0548  NA 131* 129* 130* 129* 129*  K 4.0 5.0 4.3 4.2 4.4  CL 99 94* 97* 94* 95*  CO2 25 25 26 26 26   GLUCOSE 93 141* 161* 166* 155*  BUN 14 15 14 14 12   CREATININE 1.06* 1.08* 1.02* 1.08* 1.07*  CALCIUM 8.7* 8.8* 8.8* 8.3* 8.8*    CBC: Recent Labs  Lab 12/27/21 0604 12/29/21 0539 12/30/21 0617 12/31/21 0548 01/01/22 1024  WBC 22.8* 16.8* 12.1* 14.8*  14.6*  HGB 9.0* 8.7* 8.6* 9.0* 9.6*  HCT 26.9* 26.2* 26.5* 27.6* 29.6*  MCV 88.8 88.2 89.8 89.0 89.2  PLT 291 277 256 265 274    LFT Recent Labs  Lab 12/26/21 0535 12/27/21 0604 12/31/21 0548  AST 14* 21 12*  ALT 7 11 7   ALKPHOS 112 136* 118  BILITOT 1.3* 1.4* 1.3*  PROT 5.9* 6.2* 6.0*  ALBUMIN 2.4* 2.5* 2.3*      DVT prophylaxis: SCDs  Code  Status: Full code  Family Communication: No family at bedside   CONSULTS gastroenterology, general surgery, ID, IR   Objective    Physical Examination:   General: Appearance:    female in no acute distress   On left flank, well circumcised, firm mass-- enlarged and more organized from prior days  Lungs:     respirations unlabored  Heart:    Normal heart rate.   MS:   All extremities are intact.   Neurologic:   Awake, alert       Status is: Inpatient:         [facility] If 7PM-7AM, please contact night-coverage at [email or web address],    01/01/2022, 12:03 PM  LOS: 8 days

## 2022-01-01 NOTE — Progress Notes (Addendum)
Daily Progress Note  Hospital Day: 9  Chief Complaint: pancreatic pseudocysts.   Brief History Mallory Stewart is a 61 y.o. female with a pmh not limited to cirrhosis, chronic pancreatitis, large pseudocyst admitted with left back pain, elevated white count.    Assessment / Plan   # Chronic Etoh pancreatitis complicated by infected  pseudocyst.  EUS by Dr. Rush Landmark - cystic lesion in the peripancreatic tail region. Tissue has not been obtained. However, the endosonographic appearance is suggestive of benign inflammatory changes consistent with pseudocyst or inflammatory phelgmon. Unfortunately, as noted above, not able to acess this area safely due to the portal collaterals and splenule in the region with EUS guided aspiration. IR aspirated LUQ fluid collection on 6/25 >> culture growing Klebsiella.  ID is following. Currently on ceftriaxone and metronidazole.  Interval history: Having increased left back pain today. The fluid collection in her left back had been more diffuse but now it seems more localized. The area is about the size of an orange. It is quite firm and tender. Plan: Get CBC, CT scan abdomen with pancreatic protocol  # ETOH cirrhosis with portal HTN with ascites.  Etoh in remission. EGD revealed grade I distal esophageal varices and type I gastric varices in fundus with moderate portal hypertensive gastropathy   Subjective  Worsening left lower back pain. No abdominal pain. Having BMs.    Objective   Endoscopic studies:   EUS - EUS Impression: - Pancreatic parenchymal abnormalities consisting of atrophy, hyperechoic foci, lobularity and hyperechoic strands were noted in the entire pancreas. Consistent with known chronic pancreatitis. - A cystic lesion was seen in the peripancreatic tail region. Tissue has not been obtained. However, the endosonographic appearance is suggestive of benign inflammatory changes consistent with pseudocyst or inflammatory  phelgmon. Unfortunately, as noted above, not able to acess this area safely due to the portal collaterals and splenule in the region with EUS guided aspiration. - Increased venous collaterals noted in the region of the splenic hilum. - Ascites was found on endosonographic examination of the peritoneal cavity. - A few enlarged lymph nodes were visualized in the peripancreatic region. Tissue has not been obtained. However, the endosonographic appearance is suggestive of benign inflammatory changes.   Imaging:  [place] FINE NEEDLE ASP 1ST LESION  Result Date: 12/29/2021 INDICATION: Left flank pain. Enlarging left upper quadrant retroperitoneal pancreatic pseudocyst, possibly involving body wall EXAM: ULTRASOUND ASPIRATION MEDICATIONS: No periprocedural antibiotics were indicated ANESTHESIA/SEDATION: Lidocaine 1% subcutaneous COMPLICATIONS: None immediate. PROCEDURE: Informed written consent was obtained from the patient after a thorough discussion of the procedural risks, benefits and alternatives. All questions were addressed. Maximal Sterile Barrier Technique was utilized including caps, mask, sterile gowns, sterile gloves, sterile drape, hand hygiene and skin antiseptic. A timeout was performed prior to the initiation of the procedure. Ultrasound of the left upper quadrant retroperitoneum was performed and the complex collection was localized corresponding to CT findings. An appropriate skin entry site was determined and marked. The skin prepped with chlorhexidine, draped in usual sterile fashion, infiltrated locally with 1% lidocaine. 10 cm 5 [place] multi sidehole Yueh sheath needle advanced into the collection. 330 mL opaque tan fluid were aspirated, sample sent for the requested laboratory studies. Postprocedure scans show significant decompression of the complex collection with no significant large residual component evident on ultrasound. The patient tolerated the procedure well. IMPRESSION: 1.  Technically successful ultrasound-guided aspiration of left upper quadrant retroperitoneal collection, removing 330 mL opaque tan fluid, sent for requested  laboratory studies. Electronically Signed   By: Corlis Leak M.D.   On: 12/29/2021 17:09   CT ABDOMEN PELVIS W CONTRAST  Result Date: 12/29/2021 CLINICAL DATA:  pancreatic pseudocyst, follow-up study EXAM: CT ABDOMEN AND PELVIS WITH CONTRAST TECHNIQUE: Multidetector CT imaging of the abdomen and pelvis was performed using the standard protocol following bolus administration of intravenous contrast. RADIATION DOSE REDUCTION: This exam was performed according to the departmental dose-optimization program which includes automated exposure control, adjustment of the mA and/or kV according to patient size and/or use of iterative reconstruction technique. CONTRAST:  OMNIPAQUE IOHEXOL 300 MG/ML  SOLN COMPARISON:  December 24, 2021 FINDINGS: Lower chest: Previously seen small left pleural effusion and left basilar atelectasis has significantly improved in the interim. Again seen are the extensive coronary artery atheromatous calcifications. Hepatobiliary: Hepatic surface is nodular and lobulated and these morphological changes in keeping with cirrhosis. No discrete mass lesion is identified. Status post cholecystectomy. No intrahepatic biliary dilatation. Again seen is the severe dilatation of the CBD measuring approximately 1.7 cm, tapering within the pancreatic head likely related to changes of chronic pancreatitis and reservoir effect of cholecystectomy. Pancreas: Moderate pancreatic parenchymal atrophy. Intraparenchymal loculated fluid collection at the distal body and tail of the pancreas in keeping with a pancreatic pseudocyst measuring approximately 2.5 x 4 cm in comparison to 2.6 x 4.2 cm on the previous study. This fluid collection is in continuity with the fluid collection in the left posterior pararenal space which represent retroperitoneal/posterior  parietal wall extension of the pancreatic pseudocyst. This rim enhancing septated collection measures approximately 9.1 x 7.5 cm in comparison to 8.8 x 8 cm on the previous study. The fluid pocket extending into the posterior parietal wall measures 9.2 x 3.7 cm (image 48/2) in comparison to 7 x 2.5 cm on the previous study and has increased. Spleen: Mild splenomegaly. Adrenals/Urinary Tract: Adrenal glands are unremarkable. Kidneys are normal, without renal calculi, focal lesion, or hydronephrosis except for the anterior and medial displacement of the left kidney of pseudocyst of the pancreas, stable. Bladder is unremarkable. Stomach/Bowel: Stomach is within normal limits. Appendix appears normal. No evidence of bowel wall thickening, distention, or inflammatory changes. Moderately large stool burden in the colon. Vascular/Lymphatic: Aortic atherosclerosis. No enlarged abdominal or pelvic lymph nodes. Reproductive: Uterus and bilateral adnexa are unremarkable. Other: There is a moderately large abdominopelvic ascites seen without significant interval change. Musculoskeletal: Mild lumbar spondylosis.  No fracture seen. IMPRESSION: 1. Again seen are the findings of cirrhosis of the liver and portal hypertension with small gastric varices, moderately large ascites and mild splenomegaly without significant interval change. 2. The pseudocyst at the distal body-tail of the pancreas has minimally decreased in the interim. The septated, rim enhancing fluid collection within the left posterior pararenal space measuring up to 12 cm in the greatest craniocaudad dimension is without significant interval change. The loculated pseudocyst component extending into the left posterior parietal wall has increased in the interim measuring 9.2 x 3.7 cm in comparison to 7 x 2.5 cm on the previous study. This is pseudocyst of the pancreas is amenable for a drain insertion in the appropriate clinical setting. 3.  Moderately large stool  burden in the colon of constipation. 4.  Aortic atherosclerosis (ICD10-I70.0) Electronically Signed   By: Marjo Bicker M.D.   On: 12/29/2021 12:51    Lab Results: Recent Labs    12/30/21 0617 12/31/21 0548  WBC 12.1* 14.8*  HGB 8.6* 9.0*  HCT 26.5* 27.6*  PLT 256  265   BMET Recent Labs    12/30/21 0617 12/31/21 0548  NA 129* 129*  K 4.2 4.4  CL 94* 95*  CO2 26 26  GLUCOSE 166* 155*  BUN 14 12  CREATININE 1.08* 1.07*  CALCIUM 8.3* 8.8*   LFT Recent Labs    12/31/21 0548  PROT 6.0*  ALBUMIN 2.3*  AST 12*  ALT 7  ALKPHOS 118  BILITOT 1.3*   PT/INR No results for input(s): "LABPROT", "INR" in the last 72 hours.   Scheduled inpatient medications:   insulin aspart  0-9 Units Subcutaneous Q4H   pantoprazole  40 mg Oral BID   polyethylene glycol  17 g Oral Daily   vancomycin  125 mg Oral Q12H   Continuous inpatient infusions:   cefTRIAXone (ROCEPHIN)  IV Stopped (12/31/21 1040)   metronidazole Stopped (12/31/21 2225)   PRN inpatient medications: bisacodyl, diphenhydrAMINE, mouth rinse, oxyCODONE  Vital signs in last 24 hours: Temp:  [97.8 F (36.6 C)-99 F (37.2 C)] 97.9 F (36.6 C) (08/28 0646) Pulse Rate:  [87-94] 87 (08/28 0646) Resp:  [18] 18 (08/27 2115) BP: (103-107)/(72-82) 104/72 (08/28 0646) SpO2:  [97 %-100 %] 99 % (08/28 0646) Last BM Date : 12/25/21  Intake/Output Summary (Last 24 hours) at 01/01/2022 0942 Last data filed at 01/01/2022 0400 Gross per 24 hour  Intake 300.28 ml  Output --  Net 300.28 ml    Intake/Output from previous day: 08/27 0701 - 08/28 0700 In: 400.3 [P.O.:100; IV Piggyback:300.3] Out: -  Intake/Output this shift: No intake/output data recorded.   Physical Exam:  General: Alert female in NAD. Husband in room Heart:  Regular rate and rhythm. No lower extremity edema Pulmonary: Normal respiratory effort Abdomen: Soft, nondistended, nontender. Normal bowel sounds. Left lower back has an orange size, firm and  tender mass   Neurologic: Alert and oriented Psych: Pleasant. Cooperative.    Principal Problem:   Pancreatic pseudocyst Active Problems:   Hypertension associated with diabetes (HCC)   Chronic hyponatremia   Alcoholic cirrhosis of liver with ascites (HCC)   SIRS (systemic inflammatory response syndrome) (HCC)   Left flank pain     LOS: 8 days   Mallory Stewart ,NP 01/01/2022, 9:42 AM

## 2022-01-01 NOTE — Progress Notes (Signed)
[facility]   Reason for visit: Follow up on infected pseudocyst  Interval History: Klebsiella positive culture; CT scan with stable pseudocyst.   Day 3 antibiotics   Physical Exam: Constitutional:  Vitals:   01/01/22 0646 01/01/22 1438  BP: 104/72 118/78  Pulse: 87 84  Resp:  17  Temp: 97.9 F (36.6 C) 98 F (36.7 C)  SpO2: 99% 100%   patient appears in NAD Respiratory: Normal respiratory effort  Review of Systems: Constitutional: negative for fevers and chills  Lab Results  Component Value Date   WBC 14.6 (H) 01/01/2022   HGB 9.6 (L) 01/01/2022   HCT 29.6 (L) 01/01/2022   MCV 89.2 01/01/2022   PLT 274 01/01/2022    Lab Results  Component Value Date   CREATININE 1.07 (H) 12/31/2021   BUN 12 12/31/2021   NA 129 (L) 12/31/2021   K 4.4 12/31/2021   CL 95 (L) 12/31/2021   CO2 26 12/31/2021    Lab Results  Component Value Date   ALT 7 12/31/2021   AST 12 (L) 12/31/2021   ALKPHOS 118 12/31/2021     Microbiology: Recent Results (from the past 240 hour(s))  Culture, blood (Routine X 2) w Reflex to ID Panel     Status: None   Collection Time: 12/24/21 10:50 PM   Specimen: BLOOD  Result Value Ref Range Status   Specimen Description   Final    BLOOD LEFT ANTECUBITAL Performed at [facility], [address], [place], [place] [address]    Special Requests   Final    BOTTLES DRAWN AEROBIC ONLY Blood Culture adequate volume Performed at [facility], [address] [place], [place] [address]    Culture   Final    NO GROWTH 5 DAYS Performed at [facility], [address], [place], [place] [address]    Report Status 12/30/2021 FINAL  Final  Culture, blood (Routine X 2) w Reflex to ID Panel     Status: None   Collection Time: 12/24/21 10:50 PM   Specimen: BLOOD  Result Value Ref Range Status   Specimen Description   Final    BLOOD RIGHT ANTECUBITAL Performed at [facility], [address], [place], [place] [address]    Special Requests   Final    BOTTLES DRAWN AEROBIC AND ANAEROBIC Blood Culture adequate volume Performed at [facility], [address], [place], [place] [address]    Culture   Final    NO GROWTH 5 DAYS Performed at [facility], [address], [place], [place] [address]    Report Status 12/30/2021 FINAL  Final  Body fluid culture w Gram Stain     Status: None   Collection Time: 12/26/21 11:59 AM   Specimen: PATH Cytology Peritoneal fluid  Result Value Ref Range Status   Specimen Description   Final    PERITONEAL Performed at [facility], [address], [place], [place] [address]    Special Requests   Final    NONE Performed at [facility], [address], [place], [place] [address]    Gram Stain   Final    FEW WBC PRESENT, PREDOMINANTLY MONONUCLEAR NO ORGANISMS SEEN    Culture   Final    NO GROWTH 3 DAYS Performed at [facility], [address] [place], [place] [address]    Report Status 12/29/2021 FINAL  Final  Body fluid culture w Gram Stain     Status: None   Collection Time: 12/27/21 11:50 AM   Specimen: PATH Cytology Peritoneal fluid  Result Value Ref Range Status   Specimen Description   Final    PERITONEAL Performed at [facility], [address] [place], [place] [address]    Special Requests   Final    OTHER CELLS UNIDENTIFIED; SEE CYTOLOGY REPORT Performed at [facility], [address] [place], [place] [address]    Gram Stain   Final    WBC PRESENT, PREDOMINANTLY MONONUCLEAR NO ORGANISMS SEEN CYTOSPIN SMEAR    Culture   Final    NO GROWTH 3 DAYS Performed at [facility], [address] [place], [place] [address]    Report Status 12/30/2021 FINAL  Final  Aerobic/Anaerobic Culture w Gram Stain (surgical/deep wound)     Status: None (Preliminary result)   Collection Time:  12/29/21  4:52 PM   Specimen: PATH Cytology Misc. fluid; Body Fluid  Result Value Ref Range Status   Specimen Description   Final    ABSCESS Performed at [facility], [address] [place], [place] [address]    Special Requests   Final    NONE POSTERIOR LUQ 330 ML ASPIRATE Performed at [facility], [address] [place], [place] [address]    Gram Stain   Final    ABUNDANT WBC PRESENT, PREDOMINANTLY PMN ABUNDANT GRAM VARIABLE ROD Performed at [facility], [address], [place], [place] [address]    Culture   Final    ABUNDANT KLEBSIELLA PNEUMONIAE NO ANAEROBES ISOLATED; CULTURE IN PROGRESS FOR 5 DAYS    Report Status PENDING  Incomplete   Organism ID, Bacteria KLEBSIELLA PNEUMONIAE  Final      Susceptibility   Klebsiella pneumoniae - MIC*    AMPICILLIN RESISTANT Resistant     CEFAZOLIN <=4 SENSITIVE Sensitive     CEFEPIME <=0.12 SENSITIVE Sensitive     CEFTAZIDIME <=1 SENSITIVE Sensitive     CEFTRIAXONE <=0.25 SENSITIVE Sensitive     CIPROFLOXACIN <=0.25 SENSITIVE Sensitive     GENTAMICIN <=1 SENSITIVE Sensitive     IMIPENEM <=0.25 SENSITIVE Sensitive     TRIMETH/SULFA <=20 SENSITIVE Sensitive     AMPICILLIN/SULBACTAM 4 SENSITIVE Sensitive     PIP/TAZO <=4 SENSITIVE Sensitive     * ABUNDANT KLEBSIELLA PNEUMONIAE    Impression/Plan:  1. Infected pancreatic pseudocyst - culture from IR aspirate with Klebsiella.  On ceftriaxone and metronidazole for broad coverage.  CT noted and stable pancreatic pseudocysts.   Continue with antibiotics  2.  History of C diff - on oral vancomycin with recent C diff infection and will continue.    3.  Alcoholic cirrhosis with ascites - [facility].  Recent diagnostic paracentesis and no signs of infection.

## 2022-01-01 NOTE — TOC Benefit Eligibility Note (Signed)
Patient Product/process development scientist completed.    The patient is currently admitted and upon discharge could be taking vancomycin 125 mg capsules.  The current 21 day co-pay is $0.00.   The patient is insured through [employer]     Roland Earl, CPhT Pharmacy Patient Advocate Specialist [facility] Pharm[facility] Patient Advocate Team Direct Number: [phone]  Fax: [phone]

## 2022-01-01 NOTE — Plan of Care (Signed)

## 2022-01-01 NOTE — Telephone Encounter (Signed)
Pharmacy Patient Advocate Encounter  Insurance verification completed.    The patient is insured through [employer]   The patient is currently admitted and ran test claims for the following: Vancomycin.  Copays and coinsurance results were relayed to Inpatient clinical team.

## 2022-01-02 ENCOUNTER — Other Ambulatory Visit (HOSPITAL_COMMUNITY): Payer: Self-pay

## 2022-01-02 DIAGNOSIS — K863 Pseudocyst of pancreas: Secondary | ICD-10-CM | POA: Diagnosis not present

## 2022-01-02 LAB — CBC
HCT: 28.2 % — ABNORMAL LOW (ref 36.0–46.0)
Hemoglobin: 9.1 g/dL — ABNORMAL LOW (ref 12.0–15.0)
MCH: 29 pg (ref 26.0–34.0)
MCHC: 32.3 g/dL (ref 30.0–36.0)
MCV: 89.8 fL (ref 80.0–100.0)
Platelets: 258 10*3/uL (ref 150–400)
RBC: 3.14 MIL/uL — ABNORMAL LOW (ref 3.87–5.11)
RDW: 14.1 % (ref 11.5–15.5)
WBC: 13.5 10*3/uL — ABNORMAL HIGH (ref 4.0–10.5)
nRBC: 0 % (ref 0.0–0.2)

## 2022-01-02 LAB — BASIC METABOLIC PANEL
Anion gap: 11 (ref 5–15)
BUN: 10 mg/dL (ref 6–20)
CO2: 23 mmol/L (ref 22–32)
Calcium: 8.2 mg/dL — ABNORMAL LOW (ref 8.9–10.3)
Chloride: 93 mmol/L — ABNORMAL LOW (ref 98–111)
Creatinine, Ser: 0.92 mg/dL (ref 0.44–1.00)
GFR, Estimated: >60 mL/min (ref >60)
Glucose, Bld: 255 mg/dL — ABNORMAL HIGH (ref 70–99)
Potassium: 3.7 mmol/L (ref 3.5–5.1)
Sodium: 127 mmol/L — ABNORMAL LOW (ref 135–145)

## 2022-01-02 LAB — GLUCOSE, CAPILLARY
Glucose-Capillary: 149 mg/dL — ABNORMAL HIGH (ref 70–99)
Glucose-Capillary: 172 mg/dL — ABNORMAL HIGH (ref 70–99)
Glucose-Capillary: 177 mg/dL — ABNORMAL HIGH (ref 70–99)
Glucose-Capillary: 201 mg/dL — ABNORMAL HIGH (ref 70–99)

## 2022-01-02 LAB — CYTOLOGY - NON PAP

## 2022-01-02 MED ORDER — CEFADROXIL 500 MG PO CAPS
1000.0000 mg | ORAL_CAPSULE | Freq: Two times a day (BID) | ORAL | 0 refills | Status: DC
  Filled 2022-01-02: qty 1, 1d supply, fill #0
  Filled 2022-01-02: qty 13, 3d supply, fill #0

## 2022-01-02 MED ORDER — INSULIN LISPRO (1 UNIT DIAL) 100 UNIT/ML (KWIKPEN): 6.0000 [IU] | PEN_INJECTOR | Freq: Three times a day (TID) | SUBCUTANEOUS | Status: DC | PRN

## 2022-01-02 MED ORDER — CEFADROXIL 500 MG PO CAPS: 1000.0000 mg | ORAL_CAPSULE | Freq: Two times a day (BID) | ORAL | Status: DC

## 2022-01-02 MED ORDER — METRONIDAZOLE 500 MG PO TABS
500.0000 mg | ORAL_TABLET | Freq: Two times a day (BID) | ORAL | 0 refills | Status: DC
  Filled 2022-01-02: qty 14, 7d supply, fill #0

## 2022-01-02 MED ORDER — VANCOMYCIN HCL 125 MG PO CAPS
125.0000 mg | ORAL_CAPSULE | Freq: Two times a day (BID) | ORAL | 0 refills | Status: DC
  Filled 2022-01-02: qty 20, 10d supply, fill #0

## 2022-01-02 MED ORDER — METRONIDAZOLE 500 MG PO TABS: 500.0000 mg | ORAL_TABLET | Freq: Two times a day (BID) | ORAL | Status: DC

## 2022-01-02 MED ORDER — FUROSEMIDE 20 MG PO TABS: 20.0000 mg | ORAL_TABLET | ORAL | Status: DC

## 2022-01-02 MED ORDER — TRAMADOL HCL 50 MG PO TABS
50.0000 mg | ORAL_TABLET | Freq: Four times a day (QID) | ORAL | 0 refills | Status: DC | PRN
  Filled 2022-01-02: qty 28, 7d supply, fill #0

## 2022-01-02 NOTE — Discharge Summary (Signed)
Physician Discharge Summary  Mallory Stewart DPT:[id number] DOB: 1960/10/08 DOA: 12/24/2021  PCP: [name], MD  Admit date: 12/24/2021 Discharge date: 01/02/2022  Admitted From: home Discharge disposition: home   Recommendations for Outpatient Follow-Up:   Finish abx: Can transition to oral cefadroxil 1000 mg and metronidazole 500 mg twice a day for 7 more days- -continue vancomycin oral as well to prevent c diff Outpatient GI follow up   Discharge Diagnosis:   Principal Problem:   Pancreatic pseudocyst Active Problems:   Chronic hyponatremia   Hypertension associated with diabetes ([place])   Alcoholic cirrhosis of liver with ascites (H[facility])   SIRS (systemic inflammatory response syndrome) (H[facility])   Left flank pain    Discharge Condition: Improved.  Diet recommendation:   Regular.  Wound care: None.  Code status: Full.   History of Present Illness:   Mallory Stewart is a 61 y.o. female with medical history significant of insulin-dependent type 2 diabetes, hypertension, chronic hyponatremia, alcoholic cirrhosis, chronic pancreatitis, pancreatic pseudocyst/cyst, GERD, CKD stage II-IIIa presented to ED complaining of left flank pain and swelling.  Slightly tachycardic but afebrile.  Labs showing WBC 18.8, hemoglobin 9.8 (stable since labs done 11 days ago), sodium 130 (stable), creatinine 1.0, alkaline phosphatase and T. bili mildly elevated but stable, transaminases normal, lipase normal, UA without signs of infection. CT showing interval decrease in size of a pancreatic pseudocyst within the tail of the pancreas since MRI examination 10/13/2021, however, there is now a septated, rim-enhancing fluid collection within the left posterior pararenal space measuring up to 12.9 cm in greatest dimension.  This is in continuity with the pancreatic pseudocyst and represents retroperitoneal extension of the pancreatic pseudocyst. General surgery consulted.  Patient was given  Toradol and Percocet for pain.   Patient states 6 days ago she slipped but did not fall.  2 days later she started noticing pain and swelling in her left flank region which she thought was due to a pulled muscle.  The pain has gotten progressively worse which prompted her to come into the ED to be evaluated.  Denies abdominal pain, nausea, or vomiting.  States she was previously taking both Lasix and spironolactone every day for fluid in her abdomen but her gastroenterologist had to stop spironolactone and decrease the dose of Lasix to every other day due to her sodium being low.  Patient states she was sick with UTI and a respiratory infection last month and was prescribed antibiotics after which she developed C. difficile infection.  Not having any diarrhea at this time.  No other complaints.  Denies fevers, cough, shortness of breath, chest pain, or lower extremity edema.   Hospital Course by Problem:   Enlarging pancreatic pseudocyst -CT showing interval decrease in size of a pancreatic pseudocyst within the tail of the pancreas since MRI examination 10/13/2021, however, there is now a septated, rim-enhancing fluid collection within the left posterior pararenal space measuring up to 12.9 cm in greatest dimension.  This is in continuity with the pancreatic pseudocyst and represents retroperitoneal extension of the pancreatic pseudocyst.  -General surgery feels that she has a stricture of the main pancreatic duct in the head of the pancreas leading to a blowout in the tail with subsequent pseudocyst formation -Pseudocyst will likely require drainage however general surgery does not feel that patient is a surgical candidate given history of liver cirrhosis and portal hypertension -Gastroenterology has been consulted for further management for enlarging pancreatic pseudocyst -appreciate GI consult: -  No further plans currently from a GI standpoint; outpatient follow up -EUS 8/24 not successful- -IR  aspirated 300 cc--- klebsiella--  ID following for abx: see above for final recs -repeat CT shows stability   Recent h/o c-diff -PO vanc   Alcoholic cirrhosis with ascites -CT abdomen pelvis done yesterday only showed moderate volume ascites -GI following -s/p diagnostic paracentesis 8/22 and 8/23: per GI: 1 liter removed. Elevated PMN but didn't meet criteria for SBP. Culture is NGTD   Chronic hyponatremia -mild hyponatremia due to liver cirrhosis -resume lasix   CKD stage IIIa -Creatinine is stable      Medical Consultants:   IR GI GS ID   Discharge Exam:   Vitals:   01/01/22 2005 01/02/22 0440  BP: 115/82 103/71  Pulse: 84 94  Resp: 20 19  Temp: 98.1 F (36.7 C) 98.2 F (36.8 C)  SpO2: 100% 97%   Vitals:   01/01/22 0646 01/01/22 1438 01/01/22 2005 01/02/22 0440  BP: 104/72 118/78 115/82 103/71  Pulse: 87 84 84 94  Resp:  _0 Temp: 97.9 F (36.6 C) 98 F (36.7 C) 98.1 F (36.7 C) 98.2 F (36.8 C)  TempSrc: Oral Oral    SpO2: 99% 100% 100% 97%  Weight:      Height:        General exam: Appears calm and comfortable.    The results of significant diagnostics from this hospitalization (including imaging, microbiology, ancillary and laboratory) are listed below for reference.     Procedures and Diagnostic Studies:   CT ABDOMEN PELVIS W CONTRAST  Result Date: 12/24/2021 CLINICAL DATA:  Renal mass/cyst, indeterminate renal fluid collection EXAM: CT ABDOMEN AND PELVIS WITH CONTRAST TECHNIQUE: Multidetector CT imaging of the abdomen and pelvis was performed using the standard protocol following bolus administration of intravenous contrast. RADIATION DOSE REDUCTION: This exam was performed according to the departmental dose-optimization program which includes automated exposure control, adjustment of the mA and/or kV according to patient size and/or use of iterative reconstruction technique. CONTRAST:  135m OMNIPAQUE IOHEXOL 300 MG/ML  SOLN  COMPARISON:  Noncontrast CT 12/24/2021, MRI 10/13/2021 FINDINGS: Lower chest: Small left pleural effusion with associated left basilar compressive atelectasis. Mild discoid atelectasis within the right lung base. Extensive coronary artery calcification. Global cardiac size within normal limits. Hepatobiliary: Morphologic changes in keeping with cirrhosis. Geographic peripheral areas of enhancement within the liver predominantly within segments 4 and 8 with associated capsular retraction may represent areas of confluent hepatic fibrosis. No definite intrahepatic enhancing mass identified. Stable moderate extrahepatic biliary ductal dilation, tapering within the pancreatic head likely related to changes of chronic pancreatitis. Cholecystectomy has been performed. Pancreas: Mild pancreatic parenchymal atrophy. Intraparenchymal loculated fluid collection within the tail the pancreas in keeping with a pancreatic pseudocyst appears decreased when compared to remote prior MRI examination, measuring 2.7 x 4.2 cm at axial image # 31/2, but is in continuity with the fluid collection seen within the a left posterior pararenal space which represents retroperitoneal extension of the pancreatic pseudocyst. This rim enhancing, septated collection measures 8.0 x 8.9 x 12.9 cm in greatest dimension on axial image # 41 and coronal image # 92. Spleen: Unremarkable Adrenals/Urinary Tract: The adrenal glands are unremarkable. The kidneys are normal in size and position. Mild mass effect upon the left kidney related to the left posterior pararenal pseudocyst. No hydronephrosis. No enhancing intrarenal masses. No intrarenal or ureteral calculi. The bladder is decompressed. Stomach/Bowel: Mild sigmoid diverticulosis. Small gastric varices are noted within  the gastric fundus. Stomach, small bowel, and large bowel are otherwise unremarkable. Appendix normal. Moderate ascites is present, increased in volume when compared to prior MRI  examination. No free intraperitoneal gas. Vascular/Lymphatic: Aortic atherosclerosis. No enlarged abdominal or pelvic lymph nodes. Reproductive: Uterus and bilateral adnexa are unremarkable. Other: No abdominal wall hernia Musculoskeletal: No acute bone abnormality. IMPRESSION: 1. Morphologic changes in keeping with cirrhosis and portal venous hypertension with small gastric varices. Moderate ascites, increased in volume when compared to prior MRI examination. 2. Interval decrease in size of a pancreatic pseudocyst within the tail the pancreas since MRI examination 10/13/2021, however, there is now a septated, rim-enhancing fluid collection within the left posterior pararenal space measuring up to 12.9 cm in greatest dimension. This is in continuity with the pancreatic pseudocyst and represents retroperitoneal extension of the pancreatic pseudocyst. 3. Extensive coronary artery calcification. 4. Mild sigmoid diverticulosis. Aortic Atherosclerosis (ICD10-I70.0). Electronically Signed   By: Fidela Salisbury M.D.   On: 12/24/2021 19:29   CT Renal Stone Study  Result Date: 12/24/2021 CLINICAL DATA:  Left flank swelling for the past 5 days. EXAM: CT ABDOMEN AND PELVIS WITHOUT CONTRAST TECHNIQUE: Multidetector CT imaging of the abdomen and pelvis was performed following the standard protocol without IV contrast. RADIATION DOSE REDUCTION: This exam was performed according to the departmental dose-optimization program which includes automated exposure control, adjustment of the mA and/or kV according to patient size and/or use of iterative reconstruction technique. COMPARISON:  10/13/2021 MRI FINDINGS: Lower chest: Right hemidiaphragm elevation. Bibasilar atelectasis. Normal heart size with right coronary artery calcification. Trace left pleural fluid. Hepatobiliary: Advanced cirrhosis, with caudate lobe enlargement. Cholecystectomy. Possible stones within the cystic duct remnant on 32/2. Common duct dilatation is  suboptimally evaluated but felt to be less impressive than on the prior MRI. Pancreas: Suboptimally evaluated fluid collection adjacent the pancreatic tail measures on the order of 6.3 x 2.6 cm on 35/2 versus 6.2 x 3.8 cm when remeasured in a similar fashion on the prior MRI. Spleen: Normal noncontrast appearance including at 12.1 cm craniocaudal. Adrenals/Urinary Tract: Normal adrenal glands. Normal noncontrast appearance of the right kidney. A new fluid collection within the left perirenal space measures 7.4 x 4.8 cm on 36/2. An adjacent or contiguous more superficial component, extending into the left flank, is also new since the prior MRI at 5.5 x 3.2 cm on 39/2. No hydronephrosis. No bladder calculi. Stomach/Bowel: Proximal gastric underdistention. Extensive colonic diverticulosis. Normal appendix. Normal small bowel caliber. Vascular/Lymphatic: Aortic atherosclerosis. Favor portosystemic collaterals within the omentum indicative of portal venous hypertension. No abdominopelvic adenopathy. Reproductive: Normal uterus and adnexa. Other: Moderate volume abdominopelvic ascites, similar to the prior MRI. No free intraperitoneal air. Anasarca is asymmetric left. Musculoskeletal: No acute osseous abnormality. IMPRESSION: 1. Limited exam secondary to stone study technique 2. Development of left perinephric fluid collection with adjacent component versus satellite collection extending into the left flank. Given clinical history, most likely related to progressive pseudocysts. 3. Relatively similar appearance of a presumed pseudocyst adjacent the pancreatic tail. 4. Cirrhosis with probable portal venous hypertension and moderate volume ascites. Small left pleural effusion. 5. Age advanced coronary artery atherosclerosis. Recommend assessment of coronary risk factors. 6. Cholecystectomy with possible stones within the cystic duct remnant. 7.  Aortic Atherosclerosis (ICD10-I70.0). Electronically Signed   By: Abigail Miyamoto  M.D.   On: 12/24/2021 15:14     Labs:   Basic Metabolic Panel: Recent Labs  Lab 12/27/21 0604 12/29/21 0539 12/30/21 0617 12/31/21 0548 01/02/22 1002  NA  129* 130* 129* 129* 127*  K 5.0 4.3 4.2 4.4 3.7  CL 94* 97* 94* 95* 93*  CO2 _0 GLUCOSE 141* 161* 166* 155* 255*  BUN _1 CREATININE 1.08* 1.02* 1.08* 1.07* 0.92  CALCIUM 8.8* 8.8* 8.3* 8.8* 8.2*   GFR Estimated Creatinine Clearance: 67.8 mL/min (by C-G formula based on SCr of 0.92 mg/dL). Liver Function Tests: Recent Labs  Lab 12/27/21 0604 12/31/21 0548  AST 21 12*  ALT 11 7  ALKPHOS 136* 118  BILITOT 1.4* 1.3*  PROT 6.2* 6.0*  ALBUMIN 2.5* 2.3*   No results for input(s): "LIPASE", "AMYLASE" in the last 168 hours. No results for input(s): "AMMONIA" in the last 168 hours. Coagulation profile Recent Labs  Lab 12/29/21 0829  INR 1.3*    CBC: Recent Labs  Lab 12/29/21 0539 12/30/21 0617 12/31/21 0548 01/01/22 1024 01/02/22 1002  WBC 16.8* 12.1* 14.8* 14.6* 13.5*  HGB 8.7* 8.6* 9.0* 9.6* 9.1*  HCT 26.2* 26.5* 27.6* 29.6* 28.2*  MCV 88.2 89.8 89.0 89.2 89.8  PLT 277 256 265 274 258   Cardiac Enzymes: No results for input(s): "CKTOTAL", "CKMB", "CKMBINDEX", "TROPONINI" in the last 168 hours. BNP: Invalid input(s): "POCBNP" CBG: Recent Labs  Lab 01/01/22 2003 01/02/22 0027 01/02/22 0437 01/02/22 0752 01/02/22 1128  GLUCAP 195* 172* 177* 149* 201*   D-Dimer No results for input(s): "DDIMER" in the last 72 hours. Hgb A1c No results for input(s): "HGBA1C" in the last 72 hours. Lipid Profile No results for input(s): "CHOL", "HDL", "LDLCALC", "TRIG", "CHOLHDL", "LDLDIRECT" in the last 72 hours. Thyroid function studies No results for input(s): "TSH", "T4TOTAL", "T3FREE", "THYROIDAB" in the last 72 hours.  Invalid input(s): "FREET3" Anemia work up No results for input(s): "VITAMINB12", "FOLATE", "FERRITIN", "TIBC", "IRON", "RETICCTPCT" in the last 72  hours. Microbiology Recent Results (from the past 240 hour(s))  Culture, blood (Routine X 2) w Reflex to ID Panel     Status: None   Collection Time: 12/24/21 10:50 PM   Specimen: BLOOD  Result Value Ref Range Status   Specimen Description   Final    BLOOD LEFT ANTECUBITAL Performed at [facility] [address] [place], [place] [address]    Special Requests   Final    BOTTLES DRAWN AEROBIC ONLY Blood Culture adequate volume Performed at [facility] [address], [place], [place] [address]    Culture   Final    NO GROWTH 5 DAYS Performed at [facility], [place] [address] [place], [place] [address]    Report Status 12/30/2021 FINAL  Final  Culture, blood (Routine X 2) w Reflex to ID Panel     Status: None   Collection Time: 12/24/21 10:50 PM   Specimen: BLOOD  Result Value Ref Range Status   Specimen Description   Final    BLOOD RIGHT ANTECUBITAL Performed at [facility] [address], [place], [place] [address]    Special Requests   Final    BOTTLES DRAWN AEROBIC AND ANAEROBIC Blood Culture adequate volume Performed at [facility] [address], [place], [place] [address]    Culture   Final    NO GROWTH 5 DAYS Performed at [facility], [place] [address], [place],  [address]    Report Status 12/30/2021 FINAL  Final  Body fluid culture w Gram Stain     Status: None   Collection Time: 12/26/21  11:59 AM   Specimen: PATH Cytology Peritoneal fluid  Result Value Ref Range Status   Specimen Description   Final    PERITONEAL Performed at [facility] [address] [place], [place] [address]    Special Requests   Final    NONE Performed at [facility], [place] [address], [place], [place] [address]    Gram Stain   Final    FEW WBC PRESENT, PREDOMINANTLY MONONUCLEAR NO ORGANISMS SEEN    Culture   Final    NO GROWTH 3 DAYS Performed  at [facility] [address] [place], [place] [address]    Report Status 12/29/2021 FINAL  Final  Body fluid culture w Gram Stain     Status: None   Collection Time: 12/27/21 11:50 AM   Specimen: PATH Cytology Peritoneal fluid  Result Value Ref Range Status   Specimen Description   Final    PERITONEAL Performed at [facility] [address] [place], [place] [address]    Special Requests   Final    OTHER CELLS UNIDENTIFIED; SEE CYTOLOGY REPORT Performed at [facility] [address] [place], [place] [address]    Gram Stain   Final    WBC PRESENT, PREDOMINANTLY MONONUCLEAR NO ORGANISMS SEEN CYTOSPIN SMEAR    Culture   Final    NO GROWTH 3 DAYS Performed at [facility], [place] [address], [place], [place] [address]    Report Status 12/30/2021 FINAL  Final  Aerobic/Anaerobic Culture w Gram Stain (surgical/deep wound)     Status: None (Preliminary result)   Collection Time: 12/29/21  4:52 PM   Specimen: PATH Cytology Misc. fluid; Body Fluid  Result Value Ref Range Status   Specimen Description   Final    ABSCESS Performed at [facility] [address], [place], [place] [address]    Special Requests   Final    NONE POSTERIOR LUQ 330 ML ASPIRATE Performed at [facility] [address] [place], [place] [address]    Gram Stain   Final    ABUNDANT WBC PRESENT, PREDOMINANTLY PMN ABUNDANT GRAM VARIABLE ROD Performed at [facility], [place] [address] [place], [place] [address]    Culture   Final    ABUNDANT KLEBSIELLA PNEUMONIAE NO ANAEROBES ISOLATED; CULTURE IN PROGRESS FOR 5 DAYS    Report Status PENDING  Incomplete   Organism ID, Bacteria KLEBSIELLA PNEUMONIAE  Final      Susceptibility   Klebsiella pneumoniae - MIC*    AMPICILLIN RESISTANT Resistant     CEFAZOLIN <=4 SENSITIVE Sensitive     CEFEPIME <=0.12 SENSITIVE Sensitive     CEFTAZIDIME <=1 SENSITIVE Sensitive      CEFTRIAXONE <=0.25 SENSITIVE Sensitive     CIPROFLOXACIN <=0.25 SENSITIVE Sensitive     GENTAMICIN <=1 SENSITIVE Sensitive     IMIPENEM <=0.25 SENSITIVE Sensitive     TRIMETH/SULFA <=20 SENSITIVE Sensitive     AMPICILLIN/SULBACTAM 4 SENSITIVE Sensitive     PIP/TAZO <=4 SENSITIVE Sensitive     * ABUNDANT KLEBSIELLA PNEUMONIAE     Discharge Instructions:   Discharge Instructions     Diet Carb Modified   Complete by: As directed    Increase activity slowly   Complete by: As directed       Allergies as of 01/02/2022       Reactions   Augmentin [amoxicillin-pot Clavulanate] Dermatitis   Gluten Meal Other (See Comments)  Stomach distress   Zocor [simvastatin] Other (See Comments)   Elevated lfts?        Medication List     STOP taking these medications    amitriptyline 75 MG tablet Commonly known as: ELAVIL   FeroSul 325 (65 FE) MG tablet Generic drug: ferrous sulfate   multivitamin with minerals Tabs tablet       TAKE these medications    aspirin EC 81 MG tablet Take 1 tablet (81 mg total) by mouth 2 (two) times daily. Swallow whole.   Basaglar KwikPen 100 UNIT/ML Inject 36 Units into the skin daily.   cefadroxil 500 MG capsule Commonly known as: DURICEF Take 2 capsules (1,000 mg total) by mouth 2 (two) times daily.   cetirizine 10 MG tablet Commonly known as: ZYRTEC Take 10 mg by mouth at bedtime.   furosemide 20 MG tablet Commonly known as: LASIX Take 1 tablet (20 mg total) by mouth every other day.   insulin lispro 100 UNIT/ML KwikPen Commonly known as: HumaLOG KwikPen Inject 6-10 Units into the skin 3 (three) times daily as needed (high blood sugar). Max Daily 30 units. Sliding Scale   Insulin Pen Needle 32G X 4 MM Misc 1 Device by Does not apply route in the morning, at noon, in the evening, and at bedtime.   meclizine 25 MG tablet Commonly known as: ANTIVERT Take 1/2 tablet po qhs prn vertigo What changed:  how much to take how to  take this when to take this reasons to take this additional instructions   metroNIDAZOLE 500 MG tablet Commonly known as: FLAGYL Take 1 tablet (500 mg total) by mouth every 12 (twelve) hours.   ONE TOUCH ULTRA 2 w/Device Kit Use to check blood sugars two times day   OneTouch Ultra test strip Generic drug: glucose blood CHECK BLOOD SUGAR TWICE DAILY AS DIRECTED   onetouch ultrasoft lancets Use to check blood sugar two times a day.   pantoprazole 40 MG tablet Commonly known as: PROTONIX TAKE 1 TABLET BY MOUTH TWICE DAILY BEFORE A MEAL What changed: when to take this   traMADol 50 MG tablet Commonly known as: ULTRAM Take 1 tablet (50 mg total) by mouth every 6 (six) hours as needed for moderate pain.   vancomycin 125 MG capsule Commonly known as: VANCOCIN Take 1 capsule (125 mg total) by mouth every 12 (twelve) hours.        Follow-up Information     Bedsole, Amy E, MD Follow up in 1 week(s).   Specialty: Family Medicine Contact information: [place] [address] [phone]                  Time coordinating discharge: 45 min  Signed:  Geradine Girt DO  [facility] 01/02/2022, 11:56 AM

## 2022-01-02 NOTE — Progress Notes (Signed)
Mobility Specialist - Progress Note   01/02/22 0944  Mobility  Activity Refused mobility   Pt refused mobility, claimed she is anticipating walking with husband.   [name] Mobility Specialist

## 2022-01-02 NOTE — Progress Notes (Signed)
[facility] Progress Note  CC:  Pancreatic pseudocysts  Subjective:  Feels good.  No abdominal pain.  Had some diarrhea this AM, but has been receiving Miralax so she declined the dose this AM-still on vancomycin for recent Cdiff.  Would like to go home soon.  Objective:  Vital signs in last 24 hours: Temp:  [98 F (36.7 C)-98.2 F (36.8 C)] 98.2 F (36.8 C) (08/29 0440) Pulse Rate:  [84-94] 94 (08/29 0440) Resp:  [17-20] 19 (08/29 0440) BP: (103-118)/(71-82) 103/71 (08/29 0440) SpO2:  [97 %-100 %] 97 % (08/29 0440) Last BM Date : 12/25/21 General:  Alert, Well-developed, in NAD Heart:  Regular rate and rhythm; no murmurs Pulm:  CTAB.  No W/R/R. Abdomen:  Soft, slightly distended with ascites fluid.  BS present.  Non-tender.  Extremities:  Without edema. Neurologic:  Alert and oriented x 4;  grossly normal neurologically. Psych:  Alert and cooperative. Normal mood and affect.  Intake/Output from previous day: 08/28 0701 - 08/29 0700 In: 240 [P.O.:240] Out: -   Lab Results: Recent Labs    12/31/21 0548 01/01/22 1024  WBC 14.8* 14.6*  HGB 9.0* 9.6*  HCT 27.6* 29.6*  PLT 265 274   BMET Recent Labs    12/31/21 0548  NA 129*  K 4.4  CL 95*  CO2 26  GLUCOSE 155*  BUN 12  CREATININE 1.07*  CALCIUM 8.8*   LFT Recent Labs    12/31/21 0548  PROT 6.0*  ALBUMIN 2.3*  AST 12*  ALT 7  ALKPHOS 118  BILITOT 1.3*   CT ABDOMEN W WO CONTRAST  Result Date: 01/01/2022 CLINICAL DATA:  Worsening abdominal and back pain. Pancreatic pseudocysts and cirrhosis. EXAM: CT ABDOMEN WITHOUT AND WITH CONTRAST TECHNIQUE: Multidetector CT imaging of the abdomen was performed following the standard protocol before and following the bolus administration of intravenous contrast. RADIATION DOSE REDUCTION: This exam was performed according to the departmental dose-optimization program which includes automated exposure control, adjustment of the mA and/or kV according to  patient size and/or use of iterative reconstruction technique. CONTRAST:  OMNIPAQUE IOHEXOL 300 MG/ML  SOLN COMPARISON:  12/29/2021 FINDINGS: Lower chest: No acute findings. Hepatobiliary: Hepatic cirrhosis is again demonstrated. No evidence of hepatic mass. Recanalization of paraumbilical veins is consistent with portal venous hypertension. Prior cholecystectomy noted. A few tiny less than 5 mm calculi are again seen in the cystic duct remnant. Stable dilatation of the common bile duct measuring 1.6 cm, with probable stricture of the distal common bile duct within the pancreatic head. Pancreas: Scattered tiny punctate calcifications are seen in the pancreatic head, suspicious for chronic pancreatitis. No evidence acute peripancreatic inflammatory changes. No evidence of pancreatic mass. Rim enhancing fluid collection is again seen involving the pancreatic tail measuring 3.5 x 2.0 cm, consistent with a small pseudocyst. A large multilocular rim enhancing fluid collection is again seen in the left posterior pararenal space. This measures 14 x 7 by 7.3 x 5.7 cm, without significant change, consistent with pancreatic pseudocysts. Moderate abdominal ascites shows no significant change. Spleen:  Within normal limits in size and appearance. Adrenals/Urinary Tract: No suspicious masses identified. No evidence of hydronephrosis. Stomach/Bowel: Unremarkable. Vascular/Lymphatic: No pathologically enlarged lymph nodes identified. No acute vascular findings. Chronic thrombosis of the proximal vein is seen with reconstitution of the distal splenic vein. Mild gastroesophageal varices are again seen, consistent with portal venous hypertension. Other:  None. Musculoskeletal:  No suspicious bone lesions identified. IMPRESSION: Stable pancreatic pseudocysts involving the  pancreatic tail and in the left posterior pararenal space. Moderate ascites, without significant change. Stable hepatic cirrhosis and findings of portal venous  hypertension. No evidence of hepatic neoplasm. Stable dilatation of common bile duct, with probable distal stricture within the pancreatic head. Chronic pancreatitis is suspected, and no pancreatic mass identified. Recommend correlation with liver function tests, and consider abdomen MRI and MRCP without and with contrast for further evaluation if clinically warranted. Electronically Signed   By: Danae Orleans M.D.   On: 01/01/2022 15:27    Assessment / Plan: # Chronic Etoh pancreatitis complicated by infected  pseudocyst.  EUS by Dr. Meridee Score - cystic lesion in the peripancreatic tail region. Tissue has not been obtained. However, the endosonographic appearance is suggestive of benign inflammatory changes consistent with pseudocyst or inflammatory phelgmon. Unfortunately, as noted above, not able to access this area safely due to the portal collaterals and splenule in the region with EUS guided aspiration. IR aspirated LUQ fluid collection on 6/25 >> culture growing Klebsiella.  ID is following. Currently on ceftriaxone and metronidazole.  Interval history: Having increased left back pain today. The fluid collection in her left back had been more diffuse but now it seems more localized. The area is about the size of an orange. It is quite firm and tender.  CT scan abdomen 8/28: IMPRESSION: Stable pancreatic pseudocysts involving the pancreatic tail and in the left posterior pararenal space.  Moderate ascites, without significant change.  Stable hepatic cirrhosis and findings of portal venous hypertension.  No evidence of hepatic neoplasm.  Stable dilatation of common bile duct, with probable distal stricture within the pancreatic head. Chronic pancreatitis is suspected, and no pancreatic mass identified. Recommend correlation with liver function tests, and consider abdomen MRI and MRCP without and with contrast for further evaluation if clinically warranted.   # ETOH cirrhosis with portal HTN with  ascites.  Etoh in remission. EGD revealed grade I distal esophageal varices and type I gastric varices in fundus with moderate portal hypertensive gastropathy   # Cdiff positive 7/26-still on vancomycin  -No further plans currently from a GI standpoint. -Will have ID make recommendations regarding duration of antibiotics and when ok to switch to PO, etc.  Hopefully home soon.    LOS: 9 days   Princella Pellegrini. Darrien Laakso  01/02/2022, 9:29 AM

## 2022-01-02 NOTE — Progress Notes (Signed)
    [facility]   Reason for visit: Follow up on pancreatic pseudocyst  Interval History: no fever  Physical Exam: Constitutional:  Vitals:   01/02/22 0440 01/02/22 1323  BP: 103/71 139/84  Pulse: 94 (!) 101  Resp: 19 17  Temp: 98.2 F (36.8 C) (!) 97.4 F (36.3 C)  SpO2: 97% 100%   patient appears in NAD  Impression: pancreatic pseudocyst  Plan: 1.  Can transition to oral cefadroxil 1000 mg and metronidazole 500 mg twice a day for 7 more days total.

## 2022-01-03 ENCOUNTER — Telehealth: Payer: Self-pay

## 2022-01-03 LAB — AEROBIC/ANAEROBIC CULTURE W GRAM STAIN (SURGICAL/DEEP WOUND)

## 2022-01-03 NOTE — Telephone Encounter (Signed)
Pt is following up with Dr Wilmon Pali 01-23-22 at 130pm- does not feel the need to see Dr Ermalene Searing but will call if she needs to .Marland Kitchen No TCM done

## 2022-01-04 NOTE — Telephone Encounter (Signed)
Noted  

## 2022-01-05 ENCOUNTER — Telehealth: Payer: Self-pay | Admitting: Internal Medicine

## 2022-01-05 NOTE — Telephone Encounter (Signed)
PT was recently discharged from ED and she is experiencing a lot of fluid in her legs and they're very hard. Wants to know what she should do. Please advise.

## 2022-01-05 NOTE — Telephone Encounter (Signed)
Pt stated that she was in the ED for 10 days recently for and infection where she received IV antibiotics, discharged three days ago and then started having some swelling in her legs yesterday : Pt stated that her legs are better after using some ice and elevation. Pt questioning is she should up her Lasix: Pt was notified to continue elevation. Keep Lasix as same dosage and to follow up on Tuesday with an symptom update:  Pt verbalized understanding with all questions answered.

## 2022-01-09 ENCOUNTER — Other Ambulatory Visit: Payer: Self-pay | Admitting: Internal Medicine

## 2022-01-09 ENCOUNTER — Telehealth: Payer: Self-pay

## 2022-01-09 DIAGNOSIS — E1165 Type 2 diabetes mellitus with hyperglycemia: Secondary | ICD-10-CM

## 2022-01-09 MED ORDER — BASAGLAR KWIKPEN 100 UNIT/ML ~~LOC~~ SOPN: 36.0000 [IU] | PEN_INJECTOR | Freq: Every day | SUBCUTANEOUS | 3 refills | Status: DC

## 2022-01-09 NOTE — Telephone Encounter (Signed)
Patient states that she had to throw away 2 of her Basaglar pen when she was traveling because they jammed and wouldn't work. Will need to have a refill earlier.

## 2022-01-09 NOTE — Telephone Encounter (Signed)
Patient notified and verbalized understanding. 

## 2022-01-10 ENCOUNTER — Telehealth: Payer: Self-pay | Admitting: Physician Assistant

## 2022-01-10 ENCOUNTER — Other Ambulatory Visit: Payer: Self-pay

## 2022-01-10 ENCOUNTER — Encounter (HOSPITAL_COMMUNITY): Payer: Self-pay | Admitting: *Deleted

## 2022-01-10 ENCOUNTER — Emergency Department (HOSPITAL_COMMUNITY): Payer: 59

## 2022-01-10 ENCOUNTER — Observation Stay (HOSPITAL_COMMUNITY)
Admission: EM | Admit: 2022-01-10 | Discharge: 2022-01-11 | Disposition: A | Discharge status: Home / Self Care | Payer: 59 | Attending: Family Medicine | Admitting: Family Medicine

## 2022-01-10 DIAGNOSIS — E1122 Type 2 diabetes mellitus with diabetic chronic kidney disease: Secondary | ICD-10-CM | POA: Insufficient documentation

## 2022-01-10 DIAGNOSIS — K863 Pseudocyst of pancreas: Secondary | ICD-10-CM

## 2022-01-10 DIAGNOSIS — N1831 Chronic kidney disease, stage 3a: Secondary | ICD-10-CM | POA: Insufficient documentation

## 2022-01-10 DIAGNOSIS — E1165 Type 2 diabetes mellitus with hyperglycemia: Secondary | ICD-10-CM | POA: Insufficient documentation

## 2022-01-10 DIAGNOSIS — E871 Hypo-osmolality and hyponatremia: Secondary | ICD-10-CM | POA: Diagnosis present

## 2022-01-10 DIAGNOSIS — Z7982 Long term (current) use of aspirin: Secondary | ICD-10-CM | POA: Diagnosis not present

## 2022-01-10 DIAGNOSIS — I129 Hypertensive chronic kidney disease with stage 1 through stage 4 chronic kidney disease, or unspecified chronic kidney disease: Secondary | ICD-10-CM | POA: Insufficient documentation

## 2022-01-10 DIAGNOSIS — Z79899 Other long term (current) drug therapy: Secondary | ICD-10-CM | POA: Diagnosis not present

## 2022-01-10 DIAGNOSIS — R188 Other ascites: Principal | ICD-10-CM | POA: Insufficient documentation

## 2022-01-10 DIAGNOSIS — R109 Unspecified abdominal pain: Secondary | ICD-10-CM | POA: Diagnosis present

## 2022-01-10 DIAGNOSIS — Z794 Long term (current) use of insulin: Secondary | ICD-10-CM | POA: Diagnosis not present

## 2022-01-10 DIAGNOSIS — K7031 Alcoholic cirrhosis of liver with ascites: Secondary | ICD-10-CM | POA: Diagnosis present

## 2022-01-10 DIAGNOSIS — R601 Generalized edema: Secondary | ICD-10-CM

## 2022-01-10 LAB — CBC WITH DIFFERENTIAL/PLATELET
Abs Immature Granulocytes: 0.06 10*3/uL (ref 0.00–0.07)
Basophils Absolute: 0 10*3/uL (ref 0.0–0.1)
Basophils Relative: 0 %
Eosinophils Absolute: 0 10*3/uL (ref 0.0–0.5)
Eosinophils Relative: 0 %
HCT: 31 % — ABNORMAL LOW (ref 36.0–46.0)
Hemoglobin: 9.9 g/dL — ABNORMAL LOW (ref 12.0–15.0)
Immature Granulocytes: 1 %
Lymphocytes Relative: 9 %
Lymphs Abs: 0.9 10*3/uL (ref 0.7–4.0)
MCH: 28.8 pg (ref 26.0–34.0)
MCHC: 31.9 g/dL (ref 30.0–36.0)
MCV: 90.1 fL (ref 80.0–100.0)
Monocytes Absolute: 0.5 10*3/uL (ref 0.1–1.0)
Monocytes Relative: 4 %
Neutro Abs: 8.9 10*3/uL — ABNORMAL HIGH (ref 1.7–7.7)
Neutrophils Relative %: 86 %
Platelets: 300 10*3/uL (ref 150–400)
RBC: 3.44 MIL/uL — ABNORMAL LOW (ref 3.87–5.11)
RDW: 14.6 % (ref 11.5–15.5)
WBC: 10.4 10*3/uL (ref 4.0–10.5)
nRBC: 0 % (ref 0.0–0.2)

## 2022-01-10 LAB — COMPREHENSIVE METABOLIC PANEL
ALT: <5 U/L (ref 0–44)
AST: 20 U/L (ref 15–41)
Albumin: 2.7 g/dL — ABNORMAL LOW (ref 3.5–5.0)
Alkaline Phosphatase: 100 U/L (ref 38–126)
Anion gap: 10 (ref 5–15)
BUN: 8 mg/dL (ref 6–20)
CO2: 25 mmol/L (ref 22–32)
Calcium: 8.7 mg/dL — ABNORMAL LOW (ref 8.9–10.3)
Chloride: 97 mmol/L — ABNORMAL LOW (ref 98–111)
Creatinine, Ser: 0.98 mg/dL (ref 0.44–1.00)
GFR, Estimated: >60 mL/min (ref >60)
Glucose, Bld: 129 mg/dL — ABNORMAL HIGH (ref 70–99)
Potassium: 3.7 mmol/L (ref 3.5–5.1)
Sodium: 132 mmol/L — ABNORMAL LOW (ref 135–145)
Total Bilirubin: 1 mg/dL (ref 0.3–1.2)
Total Protein: 6.9 g/dL (ref 6.5–8.1)

## 2022-01-10 LAB — CBG MONITORING, ED
Glucose-Capillary: 63 mg/dL — ABNORMAL LOW (ref 70–99)
Glucose-Capillary: 55 mg/dL — ABNORMAL LOW (ref 70–99)
Glucose-Capillary: 161 mg/dL — ABNORMAL HIGH (ref 70–99)

## 2022-01-10 LAB — PROTIME-INR
INR: 1.4 — ABNORMAL HIGH (ref 0.8–1.2)
Prothrombin Time: 16.8 seconds — ABNORMAL HIGH (ref 11.4–15.2)

## 2022-01-10 LAB — LIPASE, BLOOD: Lipase: 22 U/L (ref 11–51)

## 2022-01-10 LAB — GLUCOSE, CAPILLARY: Glucose-Capillary: 144 mg/dL — ABNORMAL HIGH (ref 70–99)

## 2022-01-10 LAB — AMMONIA: Ammonia: <10 umol/L (ref 9–35)

## 2022-01-10 MED ORDER — METRONIDAZOLE 500 MG PO TABS: 500.0000 mg | ORAL_TABLET | Freq: Two times a day (BID) | ORAL | Status: DC

## 2022-01-10 MED ORDER — MORPHINE SULFATE (PF) 4 MG/ML IV SOLN
4.0000 mg | Freq: Once | INTRAVENOUS | Status: AC
  Administered 2022-01-10: 4 mg via INTRAVENOUS
  Filled 2022-01-10: qty 1

## 2022-01-10 MED ORDER — INSULIN ASPART 100 UNIT/ML IJ SOLN
0.0000 [IU] | Freq: Three times a day (TID) | INTRAMUSCULAR | Status: DC
  Administered 2022-01-11: 3 [IU] via SUBCUTANEOUS

## 2022-01-10 MED ORDER — CEFADROXIL 500 MG PO CAPS: 1000.0000 mg | ORAL_CAPSULE | Freq: Two times a day (BID) | ORAL | Status: DC

## 2022-01-10 MED ORDER — TRAMADOL HCL 50 MG PO TABS
25.0000 mg | ORAL_TABLET | Freq: Once | ORAL | Status: AC | PRN
  Administered 2022-01-10: 25 mg via ORAL
  Filled 2022-01-10: qty 1

## 2022-01-10 MED ORDER — INSULIN ASPART 100 UNIT/ML IJ SOLN: 0.0000 [IU] | Freq: Every day | INTRAMUSCULAR | Status: DC

## 2022-01-10 MED ORDER — SPIRONOLACTONE 25 MG PO TABS
25.0000 mg | ORAL_TABLET | Freq: Every day | ORAL | Status: DC
  Administered 2022-01-10 – 2022-01-11 (×2): 25 mg via ORAL
  Filled 2022-01-10 (×2): qty 1

## 2022-01-10 MED ORDER — ALBUMIN HUMAN 25 % IV SOLN
12.5000 g | Freq: Once | INTRAVENOUS | Status: AC
  Administered 2022-01-10: 12.5 g via INTRAVENOUS
  Filled 2022-01-10: qty 50

## 2022-01-10 MED ORDER — ASPIRIN 81 MG PO TBEC
81.0000 mg | DELAYED_RELEASE_TABLET | Freq: Two times a day (BID) | ORAL | Status: DC
  Administered 2022-01-10 – 2022-01-11 (×2): 81 mg via ORAL
  Filled 2022-01-10 (×2): qty 1

## 2022-01-10 MED ORDER — FUROSEMIDE 40 MG PO TABS
20.0000 mg | ORAL_TABLET | ORAL | Status: DC
  Administered 2022-01-11: 20 mg via ORAL
  Filled 2022-01-10: qty 1

## 2022-01-10 MED ORDER — DEXTROSE 50 % IV SOLN
INTRAVENOUS | Status: AC
  Administered 2022-01-10: 50 mL via INTRAVENOUS
  Filled 2022-01-10: qty 50

## 2022-01-10 MED ORDER — ONDANSETRON HCL 4 MG PO TABS: 4.0000 mg | ORAL_TABLET | Freq: Four times a day (QID) | ORAL | Status: DC | PRN

## 2022-01-10 MED ORDER — SODIUM CHLORIDE (PF) 0.9 % IJ SOLN
INTRAMUSCULAR | Status: AC
  Filled 2022-01-10: qty 50

## 2022-01-10 MED ORDER — ONDANSETRON HCL 4 MG/2ML IJ SOLN
4.0000 mg | Freq: Once | INTRAMUSCULAR | Status: AC
  Administered 2022-01-10: 4 mg via INTRAVENOUS
  Filled 2022-01-10: qty 2

## 2022-01-10 MED ORDER — VANCOMYCIN HCL 125 MG PO CAPS
125.0000 mg | ORAL_CAPSULE | Freq: Two times a day (BID) | ORAL | Status: DC
  Administered 2022-01-10 – 2022-01-11 (×2): 125 mg via ORAL
  Filled 2022-01-10 (×3): qty 1

## 2022-01-10 MED ORDER — PANTOPRAZOLE SODIUM 40 MG PO TBEC
40.0000 mg | DELAYED_RELEASE_TABLET | Freq: Two times a day (BID) | ORAL | Status: DC
  Administered 2022-01-10 – 2022-01-11 (×2): 40 mg via ORAL
  Filled 2022-01-10 (×2): qty 1

## 2022-01-10 MED ORDER — ONDANSETRON HCL 4 MG/2ML IJ SOLN: 4.0000 mg | Freq: Four times a day (QID) | INTRAMUSCULAR | Status: DC | PRN

## 2022-01-10 MED ORDER — IOHEXOL 300 MG/ML  SOLN
100.0000 mL | Freq: Once | INTRAMUSCULAR | Status: AC | PRN
  Administered 2022-01-10: 100 mL via INTRAVENOUS

## 2022-01-10 MED ORDER — OXYCODONE HCL 5 MG PO TABS
5.0000 mg | ORAL_TABLET | ORAL | Status: DC | PRN
  Filled 2022-01-10 (×2): qty 1

## 2022-01-10 MED ORDER — DEXTROSE 50 % IV SOLN: 50.0000 mL | Freq: Once | INTRAVENOUS | Status: AC

## 2022-01-10 NOTE — ED Provider Notes (Signed)
  Physical Exam  BP 126/88   Pulse 94   Temp 98 F (36.7 C) (Oral)   Resp 17   Ht 5\' 9"  (1.753 m)   Wt 63.5 kg   SpO2 90%   BMI 20.67 kg/m   Physical Exam Vitals and nursing note reviewed.  Constitutional:      General: She is not in acute distress.    Appearance: She is well-developed.  HENT:     Head: Normocephalic and atraumatic.  Eyes:     Conjunctiva/sclera: Conjunctivae normal.  Cardiovascular:     Rate and Rhythm: Normal rate and regular rhythm.     Heart sounds: No murmur heard. Pulmonary:     Effort: Pulmonary effort is normal. No respiratory distress.     Breath sounds: Normal breath sounds.  Abdominal:     General: There is distension.     Palpations: Abdomen is soft.     Tenderness: There is abdominal tenderness.  Musculoskeletal:        General: No swelling.     Cervical back: Neck supple.  Skin:    General: Skin is warm and dry.     Capillary Refill: Capillary refill takes less than 2 seconds.  Neurological:     Mental Status: She is alert.  Psychiatric:        Mood and Affect: Mood normal.     Procedures  Procedures  ED Course / MDM   Clinical Course as of 01/10/22 1638  Wed Jan 10, 2022  1226 WBC: 10.4 White blood cell count has improved significantly [AH]  1226 Hemoglobin(!): 9.9 Hemoglobin baseline [AH]  1309 Albumin(!): 2.7 [AH]  1310 INR(!): 1.4 [AH]  1603 Jan 12, 2022 [MK]    Clinical Course User Index [AH] [id number], PA-C [MK] Federico Maiorino, MD   Medical Decision Making Amount and/or Complexity of Data Reviewed Labs: ordered. Decision-making details documented in ED Course. Radiology: ordered.  Risk Prescription drug management. Decision regarding hospitalization.   Patient received in handoff.  Alcoholic cirrhosis with need for therapeutic paracentesis.  Apparently, ultrasound nurse practitioners go home at 3:30 PM and thus the patient will need to have a hospital admission to have a procedure done.  Patient then  admitted to the hospital service.        Arthor Captain, MD 01/10/22 [phone]

## 2022-01-10 NOTE — ED Notes (Signed)
Pt ambulatory to bathroom w/o assist. Pt utilizes personal cane

## 2022-01-10 NOTE — Assessment & Plan Note (Signed)
Sodium is 132, higher than her baseline.  She has had exacerbations of her hyponatremia with symptoms in the past in the setting of diuretic use, so we will be cautious with adding back spironolactone. - Trend BMP

## 2022-01-10 NOTE — Hospital Course (Addendum)
Mrs. Mcmeekin is a 61 y.o. F with cirrhosis, chronic pancreatitis with pseudocyst, region extension of the pseudocyst into her retroperitoneum on the left, DM, HTN, history of C. difficile who presented with worsening abdominal distention, abdominal pain, and flank pain.  Patient recently admitted with left flank pain and left flank mass, found to have a large rim-enhancing fluid collection continuous with her pancreatic pseudocyst.  Admitted for 10 days, general surgery and gastroenterology were consulted, the cyst was aspirated and culture grew Klebsiella.  She underwent EUS which was not successful and drainage of the pseudocyst.  General surgery felt she was not a surgical candidate due to her liver cirrhosis and portal hypertension.  On serial CT scan, the cyst was stable and so she was discharged on antibiotics which she is still taking.  However the last few days she has had increasing pain.  No fevers, chills.  She is also noticed increasing abdominal distention, so she came to the ER.

## 2022-01-10 NOTE — ED Triage Notes (Signed)
Pt has pancreatic cyst, swelling in lower ext and abd. Pt was released form hospital for cyst 8 days ago. Symptoms increasing.

## 2022-01-10 NOTE — Telephone Encounter (Addendum)
Patient 61 year old female with alcoholic cirrhosis, chronic pancreatitis known to Dr. Leone Payor, recent admission secondary to infected large pseudocyst extending into left flank, positive Klebsiella. Patient completed antibiotics.  Leukocytosis, no fever. To the ER with worsening left flank pain, large volume ascites. CT with unchanged large pseudocyst. Outpatient diuretics have been adjusted recently secondary to hyponatremia, currently she is on lasix 20 mg every other day.  Last sodium 132 in the ER.  We will plan for therapeutic and diagnostic paracentesis, has been ordered in the ER and hope to get it done there. Continue Lasix 20 mg QOD, will see if patient is willing to add on spironolactone 50 mg every other day as well.. Week follow-up lab only for CMET to evaluate sodium As follow-up in our office 01/23/2022 with Lucrezia Europe, keep this follow-up. May need to be set up for outpatient serial paracentesis or to be evaluated for TIPS if not tolerating oral diuretics.  May need referral to tertiary center for evaluation of large pseudocyst.    Lab Results  Component Value Date   CREATININE 0.98 01/10/2022   BUN 8 01/10/2022   NA 132 (L) 01/10/2022   K 3.7 01/10/2022   CL 97 (L) 01/10/2022   CO2 25 01/10/2022

## 2022-01-10 NOTE — Assessment & Plan Note (Signed)
-   Obtain paracentesis - Continue home furosemide - Add back home spironolactone

## 2022-01-10 NOTE — H&P (Signed)
History and Physical    Patient: Mallory Stewart OJJ:[id number] DOB: 1961/05/05 DOA: 01/10/2022 DOS: the patient was seen and examined on 01/10/2022 PCP: [name], MD  Patient coming from: Home  Chief Complaint:  Chief Complaint  Patient presents with   Cyst       HPI:  Mrs. Mallory Stewart is a 61 y.o. F with cirrhosis, chronic pancreatitis with pseudocyst, region extension of the pseudocyst into her retroperitoneum on the left, DM, HTN, history of C. difficile who presented with worsening abdominal distention, abdominal pain, and flank pain.  Patient recently admitted with left flank pain and left flank mass, found to have a large rim-enhancing fluid collection continuous with her pancreatic pseudocyst.  Admitted for 10 days, general surgery and gastroenterology were consulted, the cyst was aspirated and culture grew Klebsiella.  She underwent EUS which was not successful and drainage of the pseudocyst.  General surgery felt she was not a surgical candidate due to her liver cirrhosis and portal hypertension.  On serial CT scan, the cyst was stable and so she was discharged on antibiotics which she is still taking.  However the last few days she has had increasing pain.  No fevers, chills.  She is also noticed increasing abdominal distention, so she came to the ER.      Review of Systems  Constitutional:  Negative for chills and fever.  Gastrointestinal:  Positive for abdominal pain. Negative for nausea and vomiting.       Distention, ascites  Genitourinary:  Positive for flank pain.  All other systems reviewed and are negative.    Past Medical History:  Diagnosis Date   Alcohol-induced chronic pancreatitis (HCC)    Alcoholic cirrhosis ([place])    B12 deficiency    Concussion    DDD (degenerative disc disease), cervical    Diabetes ([place])    DKA, type 2 ([place])    Gastric outlet obstruction 08/12/2018   Hypertension    Neuropathy    Pancreatic pseudocyst/cyst 05/06/2013    Seasonal allergies    Past Surgical History:  Procedure Laterality Date   ANKLE ARTHROSCOPY Right 12/20/2020   Procedure: RIGHT ANKLE ARTHROSCOPIC DEBRIDEMENT;  Surgeon: Newt Minion, MD;  Location: [place];  Service: Orthopedics;  Laterality: Right;   BIOPSY  01/19/2019   Procedure: BIOPSY;  Surgeon: Rush Landmark Telford Nab., MD;  Location: [place];  Service: Gastroenterology;;   CHOLECYSTECTOMY N/A 07/28/2016   Procedure: LAPAROSCOPIC CHOLECYSTECTOMY WITH INTRAOPERATIVE CHOLANGIOGRAM;  Surgeon: Mickeal Skinner, MD;  Location: [place];  Service: General;  Laterality: N/A;   ESOPHAGOGASTRODUODENOSCOPY (EGD) WITH PROPOFOL N/A 08/13/2018   Procedure: ESOPHAGOGASTRODUODENOSCOPY (EGD) WITH PROPOFOL;  Surgeon: Irving Copas., MD;  Location: [place];  Service: Gastroenterology;  Laterality: N/A;   ESOPHAGOGASTRODUODENOSCOPY (EGD) WITH PROPOFOL N/A 01/19/2019   Procedure: ESOPHAGOGASTRODUODENOSCOPY (EGD) WITH PROPOFOL;  Surgeon: Rush Landmark Telford Nab., MD;  Location: [place];  Service: Gastroenterology;  Laterality: N/A;   ESOPHAGOGASTRODUODENOSCOPY (EGD) WITH PROPOFOL N/A 12/28/2021   Procedure: ESOPHAGOGASTRODUODENOSCOPY (EGD) WITH PROPOFOL;  Surgeon: Rush Landmark Telford Nab., MD;  Location: [facility];  Service: Gastroenterology;  Laterality: N/A;   EUS N/A 12/28/2021   Procedure: UPPER ENDOSCOPIC ULTRASOUND (EUS) LINEAR;  Surgeon: Irving Copas., MD;  Location: [facility];  Service: Gastroenterology;  Laterality: N/A;   IR PARACENTESIS  08/08/2020   IR PARACENTESIS  03/13/2021   LEEP  1990's   ORIF FOOT FRACTURE  09/2008   L 5th metatarsal   REFRACTIVE SURGERY  2000   TIBIA IM  NAIL INSERTION Left 03/12/2021   Procedure: INTRAMEDULLARY (IM) NAIL TIBIAL;  Surgeon: Jessy Oto, MD;  Location: [place];  Service: Orthopedics;  Laterality: Left;   TONSILLECTOMY     UPPER ESOPHAGEAL ENDOSCOPIC ULTRASOUND (EUS) N/A 08/13/2018   Procedure: UPPER ESOPHAGEAL  ENDOSCOPIC ULTRASOUND (EUS);  Surgeon: Irving Copas., MD;  Location: [place];  Service: Gastroenterology;  Laterality: N/A;   UPPER ESOPHAGEAL ENDOSCOPIC ULTRASOUND (EUS) N/A 01/19/2019   Procedure: UPPER ESOPHAGEAL ENDOSCOPIC ULTRASOUND (EUS);  Surgeon: Irving Copas., MD;  Location: [place];  Service: Gastroenterology;  Laterality: N/A;   Social History:  reports that she has never smoked. She has never used smokeless tobacco. She reports that she does not currently use alcohol after a past usage of about 7.0 standard drinks of alcohol per week. She reports that she does not use drugs.  Allergies  Allergen Reactions   Augmentin [Amoxicillin-Pot Clavulanate] Dermatitis   Gluten Meal Other (See Comments)    Stomach distress   Zocor [Simvastatin] Other (See Comments)    Elevated lfts?    Family History  Problem Relation Age of Onset   Other Mother        tachycardia.Marland KitchenMarland Kitchen?afib   Stroke Father        after hernia suegery   Prostate cancer Father    Other Father        global transient amnesia, unclear source   Atrial fibrillation Sister    Healthy Brother    Healthy Brother    Coronary artery disease Paternal Grandmother    Heart attack Paternal Grandmother 16   Brain cancer Maternal Grandfather        ?   Cancer Paternal Grandfather        ?   Breast cancer Maternal Grandmother     Prior to Admission medications   Medication Sig Start Date End Date Taking? Authorizing Provider  aspirin EC 81 MG EC tablet Take 1 tablet (81 mg total) by mouth 2 (two) times daily. Swallow whole. 03/17/21  Yes Jessy Oto, MD  Insulin Glargine (BASAGLAR KWIKPEN) 100 UNIT/ML Inject 36 Units into the skin daily. 01/09/22  Yes Shamleffer, Melanie Crazier, MD  insulin lispro (HUMALOG KWIKPEN) 100 UNIT/ML KwikPen Inject 6-10 Units into the skin 3 (three) times daily as needed (high blood sugar). Max Daily 30 units. Sliding Scale Patient taking differently: Inject 6-10 Units  into the skin See admin instructions. Inject 6-10 units into the skin before meals as needed for elevated BGL, PER SLIDING SCALE 01/02/22  Yes Vann, Jessica U, DO  Blood Glucose Monitoring Suppl (ONE TOUCH ULTRA 2) w/Device KIT Use to check blood sugars two times day 08/01/18   Eliezer Lofts E, MD  cefadroxil (DURICEF) 500 MG capsule Take 2 capsules (1,000 mg total) by mouth 2 (two) times daily. 01/02/22   Geradine Girt, DO  cetirizine (ZYRTEC) 10 MG tablet Take 10 mg by mouth at bedtime.    [provider]  furosemide (LASIX) 20 MG tablet Take 1 tablet (20 mg total) by mouth every other day. 01/02/22   Eulogio Bear U, DO  glucose blood (ONETOUCH ULTRA) test strip CHECK BLOOD SUGAR TWICE DAILY AS DIRECTED 12/13/21   Bedsole, Amy E, MD  Insulin Pen Needle 32G X 4 MM MISC 1 Device by Does not apply route in the morning, at noon, in the evening, and at bedtime. 11/20/21   Shamleffer, Melanie Crazier, MD  Lancets [facility] ULTRASOFT) lancets Use to check blood sugar two times a day.  08/01/18   [name], MD  meclizine (ANTIVERT) 25 MG tablet Take 1/2 tablet po qhs prn vertigo Patient taking differently: Take 25 mg by mouth as needed (vertigo). 11/23/21   Eugenia Pancoast, FNP  metroNIDAZOLE (FLAGYL) 500 MG tablet Take 1 tablet (500 mg total) by mouth every 12 (twelve) hours. 01/02/22   Geradine Girt, DO  pantoprazole (PROTONIX) 40 MG tablet TAKE 1 TABLET BY MOUTH TWICE DAILY BEFORE A MEAL Patient taking differently: Take 40 mg by mouth 2 (two) times daily. 10/19/21   Gatha Mayer, MD  traMADol (ULTRAM) 50 MG tablet Take 1 tablet (50 mg total) by mouth every 6 (six) hours as needed for moderate pain. 01/02/22   Geradine Girt, DO  vancomycin (VANCOCIN) 125 MG capsule Take 1 capsule (125 mg total) by mouth every 12 (twelve) hours. 01/02/22   Geradine Girt, DO    Physical Exam: Vitals:   01/10/22 1400 01/10/22 1428 01/10/22 1510 01/10/22 1600  BP: 135/76  124/83 126/88  Pulse: 89  88 94   Resp: _0 Temp:  98 F (36.7 C)    TempSrc:  Oral    SpO2: 95%  100% 90%  Weight:      Height:       Thin adult female, lying in bed, in appears tired and weak Tachycardic, regular, no murmurs, 2+ pitting edema up to the knees Respiratory rate normal, lungs clear without rales or wheezes Abdomen distended, with ascites.  She has tenderness diffusely.  In her left flank she has a soft and very tender not discolored retroperitoneal mass Attention normal, affect appropriate, judgment and insight appear normal, face symmetric, speech fluent    Data Reviewed: Discussed with gastroenterology Basic metabolic panel shows mild hyponatremia, normal renal function LFTs normal Hemogram shows no leukocytosis, stable normocytic anemia, normal platelets INR 1.4 CT of the abdomen and pelvis with contrast shows chronic pancreatitis calcifications, cirrhotic morphology of the liver, small pancreatic tail cyst, varices, and an unchanged rim-enhancing fluid collection in the left flank    Assessment and Plan: * Ascites - Obtain paracentesis - Continue home furosemide - Add back home spironolactone  Pancreatic pseudocyst CT imaging today shows this is stable.  She needs outpatient follow-up with a tertiary care center for this, which GI tell me is pending.  Type 2 diabetes mellitus with hyperglycemia, with long-term current use of insulin (HCC) Glucose low here given she is n.p.o. in the ER. - Diabetic diet - Sliding scale corrections   Alcoholic cirrhosis of liver with ascites (HCC)    Chronic hyponatremia Sodium is 132, higher than her baseline.  She has had exacerbations of her hyponatremia with symptoms in the past in the setting of diuretic use, so we will be cautious with adding back spironolactone. - Trend BMP         Advance Care Planning: Presumed full code  Consults: None, spoke with GI, they plan to see as an outpatient    Severity of Illness: The  appropriate patient status for this patient is OBSERVATION. Observation status is judged to be reasonable and necessary in order to provide the required intensity of service to ensure the patient's safety. The patient's presenting symptoms, physical exam findings, and initial radiographic and laboratory data in the context of their medical condition is felt to place them at decreased risk for further clinical deterioration. Furthermore, it is anticipated that the patient will be medically stable for discharge from the hospital within 2  midnights of admission.   Author: Edwin Dada, MD 01/10/2022 5:12 PM  For on call review [email or web address].

## 2022-01-10 NOTE — ED Provider Notes (Signed)
Care assumed from Mallory Stewart at shift change, please see their note for full detail, but in brief Mallory Stewart is a 61 y.o. female presents with ascites with history of cirrhosis    Physical Exam  BP 126/88   Pulse 94   Temp 98 F (36.7 C) (Oral)   Resp 17   Ht 5\' 9"  (1.753 m)   Wt 63.5 kg   SpO2 90%   BMI 20.67 kg/m   Physical Exam  Procedures  Procedures  ED Course / MDM   Clinical Course as of 01/10/22 1815  Wed Jan 10, 2022  1226 WBC: 10.4 White blood cell count has improved significantly [AH]  1226 Hemoglobin(!): 9.9 Hemoglobin baseline [AH]  1309 Albumin(!): 2.7 [AH]  1310 INR(!): 1.4 [AH]  1603 Jan 12, 2022 [MK]    Clinical Course User Index [AH] [id number], PA-C [MK] Kommor, Madison, MD   Medical Decision Making Amount and/or Complexity of Data Reviewed Labs: ordered. Decision-making details documented in ED Course. Radiology: ordered.  Risk Prescription drug management. Decision regarding hospitalization.   Dr Arthor Captain contacted Mallory Stewart to ascertain if patient would be able to receive paracentesis in ED. Unfortunately they were not able to accommodate this, so patient was admitted to the floor for overnight observation and paracentesis in the morning.      [place], Mallory Stewart 01/10/22 1947    03/12/22, MD 01/10/22 2035

## 2022-01-10 NOTE — ED Provider Notes (Signed)
[facility] DEPT Provider Note   CSN: [id number] Arrival date & time: 01/10/22  1012     History  Chief Complaint  Patient presents with   Cyst    Mallory Stewart is a 61 y.o. female medical history significant of insulin-dependent type 2 diabetes, hypertension, chronic hyponatremia, alcoholic cirrhosis, chronic pancreatitis, pancreatic pseudocyst/cyst, GERD, CKD stage II-IIIa. Patient was admitted and discharged on 01/02/22 after presenting with flank pain and getting admitted with a large psuedocyst. IR aspirated the cyst and it was pos for Klebsiella. She was discharged on cefadroxil and flagyl.  Patient complains of increasing swelling in her abdomen and legs and progressively worsening flank pain and swelling.  She states that when she presented last time she had an area of swelling in the left flank.  Review of EMR shows that the pseudocyst extended into the retroperitoneal space.  She states that this is the exact same place where she had the pain and swelling when she presented last time she was admitted.  She has abdominal distention and underwent a paracentesis about 2 weeks ago while admitted.  She also states that she has significant swelling in the lower legs which is much worse than its ever been.  She denies fevers chills nausea or vomiting.  HPI     Home Medications Prior to Admission medications   Medication Sig Start Date End Date Taking? Authorizing Provider  aspirin EC 81 MG EC tablet Take 1 tablet (81 mg total) by mouth 2 (two) times daily. Swallow whole. 03/17/21   Jessy Oto, MD  Blood Glucose Monitoring Suppl (ONE TOUCH ULTRA 2) w/Device KIT Use to check blood sugars two times day 08/01/18   [name], MD  cefadroxil (DURICEF) 500 MG capsule Take 2 capsules (1,000 mg total) by mouth 2 (two) times daily. 01/02/22   Geradine Girt, DO  cetirizine (ZYRTEC) 10 MG tablet Take 10 mg by mouth at bedtime.    [provider]   furosemide (LASIX) 20 MG tablet Take 1 tablet (20 mg total) by mouth every other day. 01/02/22   Eulogio Bear U, DO  glucose blood (ONETOUCH ULTRA) test strip CHECK BLOOD SUGAR TWICE DAILY AS DIRECTED 12/13/21   Bedsole, Amy E, MD  Insulin Glargine (BASAGLAR KWIKPEN) 100 UNIT/ML Inject 36 Units into the skin daily. 01/09/22   Shamleffer, Melanie Crazier, MD  insulin lispro (HUMALOG KWIKPEN) 100 UNIT/ML KwikPen Inject 6-10 Units into the skin 3 (three) times daily as needed (high blood sugar). Max Daily 30 units. Sliding Scale 01/02/22   Geradine Girt, DO  Insulin Pen Needle 32G X 4 MM MISC 1 Device by Does not apply route in the morning, at noon, in the evening, and at bedtime. 11/20/21   Shamleffer, Melanie Crazier, MD  Lancets [facility] ULTRASOFT) lancets Use to check blood sugar two times a day. 08/01/18   [name], MD  meclizine (ANTIVERT) 25 MG tablet Take 1/2 tablet po qhs prn vertigo Patient taking differently: Take 25 mg by mouth as needed (vertigo). 11/23/21   Eugenia Pancoast, FNP  metroNIDAZOLE (FLAGYL) 500 MG tablet Take 1 tablet (500 mg total) by mouth every 12 (twelve) hours. 01/02/22   Geradine Girt, DO  pantoprazole (PROTONIX) 40 MG tablet TAKE 1 TABLET BY MOUTH TWICE DAILY BEFORE A MEAL Patient taking differently: Take 40 mg by mouth 2 (two) times daily. 10/19/21   Gatha Mayer, MD  traMADol (ULTRAM) 50 MG tablet Take 1 tablet (50 mg  total) by mouth every 6 (six) hours as needed for moderate pain. 01/02/22   Geradine Girt, DO  vancomycin (VANCOCIN) 125 MG capsule Take 1 capsule (125 mg total) by mouth every 12 (twelve) hours. 01/02/22   Geradine Girt, DO      Allergies    Augmentin [amoxicillin-pot clavulanate], Gluten meal, and Zocor [simvastatin]    Review of Systems   Review of Systems  Physical Exam Updated Vital Signs BP (!) 137/94 (BP Location: Left Arm)   Pulse (!) 105   Temp 98 F (36.7 C) (Oral)   Resp 18   Ht 5' 9" (1.753 m)   Wt 63.5 kg   SpO2 100%    BMI 20.67 kg/m  Physical Exam Vitals and nursing note reviewed.  Constitutional:      General: She is not in acute distress.    Appearance: She is well-developed. She is not diaphoretic.  HENT:     Head: Normocephalic and atraumatic.     Right Ear: External ear normal.     Left Ear: External ear normal.     Nose: Nose normal.     Mouth/Throat:     Mouth: Mucous membranes are moist.  Eyes:     General: No scleral icterus.    Extraocular Movements: Extraocular movements intact.     Conjunctiva/sclera: Conjunctivae normal.     Pupils: Pupils are equal, round, and reactive to light.  Cardiovascular:     Rate and Rhythm: Normal rate and regular rhythm.     Heart sounds: Normal heart sounds. No murmur heard.    No friction rub. No gallop.  Pulmonary:     Effort: Pulmonary effort is normal. No respiratory distress.     Breath sounds: Normal breath sounds.  Abdominal:     General: Bowel sounds are normal. There is distension.     Palpations: Abdomen is soft. There is no mass.     Tenderness: There is no abdominal tenderness. There is left CVA tenderness. There is no guarding.     Comments: Left flank with sig Swelling . Exquisetly ttp.  Musculoskeletal:     Cervical back: Normal range of motion.     Right lower leg: 2+ Pitting Edema present.     Left lower leg: 2+ Pitting Edema present.  Skin:    General: Skin is warm and dry.  Neurological:     Mental Status: She is alert and oriented to person, place, and time.  Psychiatric:        Behavior: Behavior normal.     ED Results / Procedures / Treatments   Labs (all labs ordered are listed, but only abnormal results are displayed) Labs Reviewed  COMPREHENSIVE METABOLIC PANEL  PROTIME-INR  CBC WITH DIFFERENTIAL/PLATELET  LIPASE, BLOOD  AMMONIA    EKG None  Radiology No results found.  Procedures Procedures    Medications Ordered in ED Medications - No data to display  ED Course/ Medical Decision Making/  A&P Clinical Course as of 01/11/22 1929  Wed Jan 10, 2022  1226 WBC: 10.4 White blood cell count has improved significantly [AH]  1226 Hemoglobin(!): 9.9 Hemoglobin baseline [AH]  1309 Albumin(!): 2.7 [AH]  1310 INR(!): 1.4 [AH]  1603 [id number] [MK]    Clinical Course User Index [AH] Margarita Mail, PA-C [MK] Kommor, Debe Coder, MD                           Medical Decision Making 62 year old female with  a history of alcoholic cirrhosis, complicated past medical history including extremely large pancreatic pseudocyst extending into the retroperitoneal space.  This was drained and showed Klebsiella infection for which she has been treated.  After review of all data points labs revealed no elevated light leukocytosis.  She has hypoalbuminemia ammonia lipase within normal limits, PT/INR are elevated consistent with his story of liver failure.  I ordered visualized and personally interpreted results of the CT abdomen and pelvis.  It shows large septated rim-enhancing lesion consistent with history of large pancreatic pseudocyst in the retroperitoneum.  This is unchanged in size.  Although patient has visible swelling in the left flank and increased pain I's believe this is secondary to large volume ascites and anasarca with pressure on the pseudocyst.  She did not appear to have any evidence of acute pancreatitis.  She does not appear to have any evidence of spontaneous bacterial peritonitis.  Her white count is improved significantly after finishing her course of cefadroxil and Flagyl.  I have consulted with gastroenterology service repeatedly regarding plans for this patient.  Unfortunately at this time we are unable to obtain paracentesis which was the original plan if pain was improved.  Also planned to add back spironolactone to her Lasix which was decreased when she continued to have episodes of profound hyponatremia.  Patient handoff given to [facility] at shift change.  She will need  admission for definitive care.  Amount and/or Complexity of Data Reviewed Labs: ordered. Decision-making details documented in ED Course. Radiology: ordered.  Risk Prescription drug management. Decision regarding hospitalization.           Final Clinical Impression(s) / ED Diagnoses Final diagnoses:  None    Rx / DC Orders ED Discharge Orders     None         Margarita Mail, PA-C 01/11/22 1934    Milton Ferguson, MD 01/12/22 1102

## 2022-01-10 NOTE — Assessment & Plan Note (Addendum)
CT imaging today shows this is stable.  She needs outpatient follow-up with a tertiary care center for this, which GI tell me is pending.

## 2022-01-10 NOTE — ED Provider Notes (Signed)
Care assumed from [name] at shift change, please see their note for full detail, but in brief Mallory Stewart is a 61 y.o. female presents with ascites.   Physical Exam  BP 126/88   Pulse 94   Temp 98 F (36.7 C) (Oral)   Resp 17   Ht 5\' 9"  (1.753 m)   Wt 63.5 kg   SpO2 90%   BMI 20.67 kg/m   Physical Exam  Procedures  Procedures  ED Course / MDM   Clinical Course as of 01/10/22 1711  Wed Jan 10, 2022  1226 WBC: 10.4 White blood cell count has improved significantly [AH]  1226 Hemoglobin(!): 9.9 Hemoglobin baseline [AH]  1309 Albumin(!): 2.7 [AH]  1310 INR(!): 1.4 [AH]  1603 Jan 12, 2022 [MK]    Clinical Course User Index [AH] [id number], PA-C [MK] Kommor, Madison, MD   Medical Decision Making Amount and/or Complexity of Data Reviewed Labs: ordered. Decision-making details documented in ED Course. Radiology: ordered.  Risk Prescription drug management. Decision regarding hospitalization.   We attempted to order ultrasound-guided paracentesis here in the emergency department, however this was not available at time of ordering.  Ultimately, my attending called and had patient admitted for overnight admission with paracentesis in the morning.        Mallory Stewart, Mallory Stewart 01/10/22 1713    03/12/22, MD 01/10/22 2035

## 2022-01-10 NOTE — Assessment & Plan Note (Signed)
Glucose low here given she is n.p.o. in the ER. - Diabetic diet - Sliding scale corrections

## 2022-01-11 ENCOUNTER — Observation Stay (HOSPITAL_COMMUNITY): Payer: 59

## 2022-01-11 DIAGNOSIS — K7031 Alcoholic cirrhosis of liver with ascites: Secondary | ICD-10-CM | POA: Diagnosis not present

## 2022-01-11 DIAGNOSIS — E871 Hypo-osmolality and hyponatremia: Secondary | ICD-10-CM | POA: Diagnosis not present

## 2022-01-11 DIAGNOSIS — R188 Other ascites: Secondary | ICD-10-CM | POA: Diagnosis not present

## 2022-01-11 DIAGNOSIS — K863 Pseudocyst of pancreas: Secondary | ICD-10-CM | POA: Diagnosis not present

## 2022-01-11 LAB — CBC
HCT: 35.8 % — ABNORMAL LOW (ref 36.0–46.0)
Hemoglobin: 11 g/dL — ABNORMAL LOW (ref 12.0–15.0)
MCH: 29.2 pg (ref 26.0–34.0)
MCHC: 30.7 g/dL (ref 30.0–36.0)
MCV: 95 fL (ref 80.0–100.0)
Platelets: 260 10*3/uL (ref 150–400)
RBC: 3.77 MIL/uL — ABNORMAL LOW (ref 3.87–5.11)
RDW: 14.9 % (ref 11.5–15.5)
WBC: 8.5 10*3/uL (ref 4.0–10.5)
nRBC: 0 % (ref 0.0–0.2)

## 2022-01-11 LAB — COMPREHENSIVE METABOLIC PANEL
ALT: 7 U/L (ref 0–44)
AST: 25 U/L (ref 15–41)
Albumin: 2.5 g/dL — ABNORMAL LOW (ref 3.5–5.0)
Alkaline Phosphatase: 88 U/L (ref 38–126)
Anion gap: 10 (ref 5–15)
BUN: 7 mg/dL (ref 6–20)
CO2: 24 mmol/L (ref 22–32)
Calcium: 8.4 mg/dL — ABNORMAL LOW (ref 8.9–10.3)
Chloride: 99 mmol/L (ref 98–111)
Creatinine, Ser: 0.87 mg/dL (ref 0.44–1.00)
GFR, Estimated: >60 mL/min (ref >60)
Glucose, Bld: 73 mg/dL (ref 70–99)
Potassium: 3.7 mmol/L (ref 3.5–5.1)
Sodium: 133 mmol/L — ABNORMAL LOW (ref 135–145)
Total Bilirubin: 1 mg/dL (ref 0.3–1.2)
Total Protein: 6.4 g/dL — ABNORMAL LOW (ref 6.5–8.1)

## 2022-01-11 LAB — PROTEIN, PLEURAL OR PERITONEAL FLUID: Total protein, fluid: <3 g/dL

## 2022-01-11 LAB — BODY FLUID CELL COUNT WITH DIFFERENTIAL
Eos, Fluid: 0 %
Lymphs, Fluid: 34 %
Monocyte-Macrophage-Serous Fluid: 48 % — ABNORMAL LOW (ref 50–90)
Neutrophil Count, Fluid: 18 % (ref 0–25)
Total Nucleated Cell Count, Fluid: 193 cu mm (ref 0–1000)

## 2022-01-11 LAB — ALBUMIN, PLEURAL OR PERITONEAL FLUID: Albumin, Fluid: <1.5 g/dL

## 2022-01-11 LAB — GLUCOSE, CAPILLARY
Glucose-Capillary: 67 mg/dL — ABNORMAL LOW (ref 70–99)
Glucose-Capillary: 83 mg/dL (ref 70–99)
Glucose-Capillary: 165 mg/dL — ABNORMAL HIGH (ref 70–99)

## 2022-01-11 MED ORDER — TRAMADOL HCL 50 MG PO TABS
50.0000 mg | ORAL_TABLET | Freq: Four times a day (QID) | ORAL | Status: AC | PRN
  Administered 2022-01-11: 50 mg via ORAL
  Filled 2022-01-11: qty 1

## 2022-01-11 MED ORDER — ALBUMIN HUMAN 25 % IV SOLN
25.0000 g | Freq: Once | INTRAVENOUS | Status: AC
  Administered 2022-01-11: 25 g via INTRAVENOUS
  Filled 2022-01-11 (×2): qty 100

## 2022-01-11 MED ORDER — TRAMADOL HCL 50 MG PO TABS: 50.0000 mg | ORAL_TABLET | Freq: Three times a day (TID) | ORAL | 0 refills | Status: AC | PRN

## 2022-01-11 MED ORDER — SPIRONOLACTONE 25 MG PO TABS: 25.0000 mg | ORAL_TABLET | Freq: Every day | ORAL | 3 refills | Status: DC

## 2022-01-11 MED ORDER — LIDOCAINE HCL 1 % IJ SOLN
INTRAMUSCULAR | Status: AC
  Administered 2022-01-11: 10 mL
  Filled 2022-01-11: qty 20

## 2022-01-11 NOTE — TOC Initial Note (Signed)
Transition of Care [facility]) - Initial/Assessment Note    Patient Details  Name: Mallory Stewart MRN: [id number] Date of Birth: April 30, 1961  Transition of [facility]) CM/SW Contact:    Golda Acre, RN Phone Number: 01/11/2022, 7:57 AM  Clinical Narrative:                  Transition of Care [facility]) Screening Note   Patient Details  Name: Mallory Stewart Date of Birth: Nov 11, 1960   Transition [facility] Care [facility]) CM/SW Contact:    Golda Acre, RN Phone Number: 01/11/2022, 7:57 AM    Transition of Care Department [facility]) has reviewed patient and no TOC needs have been identified at this time. We will continue to monitor patient advancement through interdisciplinary progression rounds. If new patient transition needs arise, please place a TOC consult.    Expected Discharge Plan: Home/Self Care Barriers to Discharge: Continued Medical Work up   Patient Goals and CMS Choice Patient states their goals for this hospitalization and ongoing recovery are:: to go home      Expected Discharge Plan and Services Expected Discharge Plan: Home/Self Care   Discharge Planning Services: CM Consult   Living arrangements for the past 2 months: Single Family Home                                      Prior Living Arrangements/Services Living arrangements for the past 2 months: Single Family Home Lives with:: Spouse Patient language and need for interpreter reviewed:: Yes Do you feel safe going back to the place where you live?: Yes            Criminal Activity/Legal Involvement Pertinent to Current Situation/Hospitalization: No - Comment as needed  Activities of Daily Living Home Assistive Devices/Equipment: Cane (specify quad or straight) ADL Screening (condition at time of admission) Patient's cognitive ability adequate to safely complete daily activities?: Yes Is the patient deaf or have difficulty hearing?: No Does the patient have difficulty seeing, even when  wearing glasses/contacts?: No Does the patient have difficulty concentrating, remembering, or making decisions?: No Patient able to express need for assistance with ADLs?: Yes Does the patient have difficulty dressing or bathing?: No Independently performs ADLs?: Yes (appropriate for developmental age) Does the patient have difficulty walking or climbing stairs?: Yes Weakness of Legs: Both Weakness of Arms/Hands: None  Permission Sought/Granted                  Emotional Assessment Appearance:: Appears stated age Attitude/Demeanor/Rapport: Engaged Affect (typically observed): Calm Orientation: : Oriented to Self, Oriented to Place, Oriented to  Time, Oriented to Situation Alcohol / Substance Use: Alcohol Use (history of 7.0 drinks per day) Psych Involvement: No (comment)  Admission diagnosis:  Ascites [R18.8] Patient Active Problem List   Diagnosis Date Noted   Ascites 01/10/2022   Left flank pain    Pancreatic pseudocyst 12/24/2021   SIRS (systemic inflammatory response syndrome) (HCC) 12/24/2021   Shortness of breath 11/28/2021   Diarrhea of presumed infectious origin 11/28/2021   Benign positional vertigo, right 11/23/2021   Acute non-recurrent frontal sinusitis 11/23/2021   Weakness of both lower extremities 11/23/2021   Hyponatremia 11/23/2021   Balance problem 07/04/2021   H/O multiple concussions 07/04/2021   Type 2 diabetes mellitus with hyperglycemia, with long-term current use of insulin (HCC) 03/24/2021   Secondary esophageal varices without bleeding (HCC)    Gastric varices  without bleeding 12/06/2020   Portal hypertension (HCC) 09/30/2020   Dilated bile duct 09/30/2020   Alcoholic cirrhosis of liver with ascites (HCC) 09/29/2020   Frequent falls 09/14/2020   Splenomegaly 08/09/2020   Malnutrition of moderate degree (HCC) 08/08/2020   Chronic hyponatremia 08/06/2020   Transaminitis 08/06/2020   Serum total bilirubin elevated 08/06/2020   Generalized  weakness 08/05/2020   Idiopathic peripheral neuropathy 07/13/2020   Low HDL (under 40) 06/21/2020   Allergic rhinitis 06/21/2020   Duodenitis - edema ? andgioedema 10/30/2018   Chronic alcoholic pancreatitis (HCC) 09/02/2018   Diabetes mellitus with circulatory complication, HTN (HCC) 08/22/2018   Dizziness 12/26/2017   Chronic pain 05/17/2017   Osteoarthritis of spine with radiculopathy, cervical region 09/17/2016   Hyperkalemia 03/02/2016   B12 deficiency 10/21/2014   GAD (generalized anxiety disorder) 09/23/2009   Hyperlipidemia associated with type 2 diabetes mellitus (HCC) 11/03/2008   Hypertension associated with diabetes (HCC) 09/29/2008   PCP:  Excell Seltzer, MD Pharmacy:   [id number] [place], [place] - [address] AT [facility] [address] [place] [address] Phone: [phone] Fax: [phone]     Social Determinants of Health (SDOH) Interventions    Readmission Risk Interventions   Row Labels 12/26/2021   12:51 PM  Readmission Risk Prevention Plan   Section Header. No data exists in this row.   Transportation Screening   Complete  PCP or Specialist Appt within 5-7 Days   Complete  Home Care Screening   Complete  Medication Review (RN CM)   Complete

## 2022-01-11 NOTE — Procedures (Signed)
PROCEDURE SUMMARY:  Successful ultrasound guided paracentesis from the right lower quadrant.  Yielded 5.9 liters of yellow fluid.  No immediate complications.  The patient tolerated the procedure well.   Specimen was sent for labs.  EBL none  The patient has required >/=2 paracenteses in a 30 day period and a screening evaluation by the [facility] has been arranged.   Artemio Aly

## 2022-01-11 NOTE — Discharge Summary (Signed)
Physician Discharge Summary   Patient: Mallory Stewart MRN: [id number] DOB: 1960/11/25  Admit date:     01/10/2022  Discharge date: 01/11/22  Discharge Physician: Edwin Dada   PCP: [name], MD     Recommendations at discharge:  Follow up with Dr. Celesta Aver office on Sep 19th with Tye Savoy Get labs checked in 1 week to check sodium and adjust furosemide and spironolactone as indicated Dr. Diona Browner: Please check weekly sodium for next month while increasing diuretics Continue with referral to tertiary center for definitive management of pancreatic pseudocyst      Discharge Diagnoses: Principal Problem:   Ascites Active Problems:   Chronic hyponatremia   Alcoholic cirrhosis of liver with ascites ([place])   Type 2 diabetes mellitus with hyperglycemia, with long-term current use of insulin [facility])   Pancreatic pseudocyst       Hospital Course: Mallory Stewart is a 61 y.o. F with cirrhosis, chronic pancreatitis with pseudocyst, recent very large extension of the pseudocyst into her retroperitoneum on the left, DM, HTN, history of C. difficile who presented with worsening abdominal distention, abdominal pain, and flank pain.  After recent admission for pancreatic pseudocyst, patient toko her antibiotics and was well but had progressive abdominal swelling, dyspnea, and flank pain.    In the ER, CT showed stable size of her pseudocyst, and normal WBC and no fever, but she was noted to have symptomatic ascites after recently reducing her diuretics due to symptomatic hyponatremia.          * Ascites Cirrhosis Patient was admitted overnight, underwent paracentesis of 6L, cell count ruled out SBP.    She felt symptomatic relief after paracentesis.  She was given 6 g/dL albumin and discharged.  Spironolactone was added back at 25 daily and furosemide continued every other day.    Pancreatic pseudocyst CT imaging showed this was stable.  This is a major  concern for patient and husband.  I discussed with GI, and referral to tertiary care center is pending.    Chronic hyponatremia Sodium 132-134 here after starting spironolactone.   She has had severe symptomatic exacerbations of hyponatremia with diuretics before, and so they had recently been cut back - Cautiously restarted spironolactone - Check sodium weekly while titrating diuretics             [facility] was reviewed for this patient prior to discharge.   Consultants: Discussed with GI Procedures performed: Paracentesis  Disposition: Home   DISCHARGE MEDICATION: Allergies as of 01/11/2022       Reactions   Augmentin [amoxicillin-pot Clavulanate] Dermatitis   Gluten Meal Other (See Comments)   Stomach distress   Zocor [simvastatin] Other (See Comments)   Elevated lfts?        Medication List     TAKE these medications    aspirin EC 81 MG tablet Take 1 tablet (81 mg total) by mouth 2 (two) times daily. Swallow whole.   Basaglar KwikPen 100 UNIT/ML Inject 36 Units into the skin daily.   CALCIUM+D3 PO Take 1 tablet by mouth daily.   cefadroxil 500 MG capsule Commonly known as: DURICEF Take 2 capsules (1,000 mg total) by mouth 2 (two) times daily.   cetirizine 10 MG tablet Commonly known as: ZYRTEC Take 10 mg by mouth at bedtime.   cyanocobalamin 2000 MCG tablet Take 2,000 mcg by mouth daily.   furosemide 20 MG tablet Commonly known as: LASIX Take 1 tablet (20 mg total) by mouth  every other day. What changed:  when to take this additional instructions   insulin lispro 100 UNIT/ML KwikPen Commonly known as: HumaLOG KwikPen Inject 6-10 Units into the skin 3 (three) times daily as needed (high blood sugar). Max Daily 30 units. Sliding Scale What changed:  when to take this additional instructions   Insulin Pen Needle 32G X 4 MM Misc 1 Device by Does not apply route in the morning, at noon, in the evening,  and at bedtime.   meclizine 25 MG tablet Commonly known as: ANTIVERT Take 1/2 tablet po qhs prn vertigo What changed:  how much to take how to take this when to take this reasons to take this additional instructions   Melatonin 10 MG Tabs Take 20 mg by mouth at bedtime.   metroNIDAZOLE 500 MG tablet Commonly known as: FLAGYL Take 1 tablet (500 mg total) by mouth every 12 (twelve) hours.   ONE TOUCH ULTRA 2 w/Device Kit Use to check blood sugars two times day   OneTouch Ultra test strip Generic drug: glucose blood CHECK BLOOD SUGAR TWICE DAILY AS DIRECTED   onetouch ultrasoft lancets Use to check blood sugar two times a day.   pantoprazole 40 MG tablet Commonly known as: PROTONIX TAKE 1 TABLET BY MOUTH TWICE DAILY BEFORE A MEAL What changed: when to take this   spironolactone 25 MG tablet Commonly known as: ALDACTONE Take 1 tablet (25 mg total) by mouth daily.   traMADol 50 MG tablet Commonly known as: ULTRAM Take 1 tablet (50 mg total) by mouth 3 (three) times daily as needed for up to 5 days for moderate pain. What changed: when to take this   vancomycin 125 MG capsule Commonly known as: VANCOCIN Take 1 capsule (125 mg total) by mouth every 12 (twelve) hours.        Follow-up Information     [name], MD Follow up.   Specialty: Family Medicine Contact information: [place] [address] [phone]         Gatha Mayer, MD Follow up.   Specialty: Gastroenterology Contact information: [address] [place] [address] [phone]                 Discharge Instructions     Discharge instructions   Complete by: As directed    From Dr. Loleta Books: You were admitted for abdominal discomfort and swelling. Here, you had a paracentesis and 5.9L of fluid was removed and you had albumin to replace it.  You should continue your furosemide/Lasix 20 mg every other day no change START spironolactone 25 mg  daily  Get a comprehensive metabolic panel in 5-7 days (this can be by Dr. Diona Browner or Dr. Carlean Purl) Adjust the spironolactone and furosemide based on that lab   Resume your antibiotics you were taking for the cyst (if you have already completed all the antibiotics you had before, this is fine, just complete the course)   Resume your home diabetes medicines   Increase activity slowly   Complete by: As directed        Discharge Exam: Filed Weights   01/10/22 1043  Weight: 63.5 kg    General: Pt is alert, awake, not in acute distress Cardiovascular: RRR, nl S1-S2, no murmurs appreciated.   No LE edema.   Respiratory: Normal respiratory rate and rhythm.  CTAB without rales or wheezes. Abdominal: Abdomen soft and non-tender.  No distension or HSM.   Neuro/Psych: Strength symmetric in upper  and lower extremities.  Judgment and insight appear normal.   Condition at discharge: good  The results of significant diagnostics from this hospitalization (including imaging, microbiology, ancillary and laboratory) are listed below for reference.   Imaging Studies: US Paracentesis  Result Date: 01/11/2022 INDICATION: Patient with history of pancreatic pseudocyst, chronic pancreatitis, alcoholic cirrhosis, recurrent ascites; request received for diagnostic and therapeutic paracentesis. EXAM: ULTRASOUND GUIDED DIAGNOSTIC AND THERAPEUTIC PARACENTESIS MEDICATIONS: 8 mL 1% lidocaine COMPLICATIONS: None immediate. PROCEDURE: Informed written consent was obtained from the patient after a discussion of the risks, benefits and alternatives to treatment. A timeout was performed prior to the initiation of the procedure. Initial ultrasound scanning demonstrates a large amount of ascites within the right lower abdominal quadrant. The right lower abdomen was prepped and draped in the usual sterile fashion. 1% lidocaine was used for local anesthesia. Following this, a 19 gauge, 7-cm, Yueh catheter was introduced.  An ultrasound image was saved for documentation purposes. The paracentesis was performed. The catheter was removed and a dressing was applied. The patient tolerated the procedure well without immediate post procedural complication. FINDINGS: A total of approximately 5.9 liters of yellow fluid was removed. Samples were sent to the laboratory as requested by the clinical team. IMPRESSION: Successful ultrasound-guided diagnostic and therapeutic paracentesis yielding 5.9 liters of peritoneal fluid. PLAN: The patient has required >/=2 paracenteses in a 30 day period and a screening evaluation by the [facility] has been arranged. Read by: Rowe Robert, PA-C Electronically Signed   By: Markus Daft M.D.   On: 01/11/2022 10:52   CT ABDOMEN PELVIS W CONTRAST  Result Date: 01/10/2022 CLINICAL DATA:  Evaluate abdominal distension. Pancreatic pseudocyst. EXAM: CT ABDOMEN AND PELVIS WITH CONTRAST TECHNIQUE: Multidetector CT imaging of the abdomen and pelvis was performed using the standard protocol following bolus administration of intravenous contrast. RADIATION DOSE REDUCTION: This exam was performed according to the departmental dose-optimization program which includes automated exposure control, adjustment of the mA and/or kV according to patient size and/or use of iterative reconstruction technique. CONTRAST:  121m OMNIPAQUE IOHEXOL 300 MG/ML  SOLN COMPARISON:  01/01/2022 FINDINGS: Lower chest: Mild subsegmental atelectasis noted within the lung bases. Hepatobiliary: The liver appears cirrhotic with hypertrophy of the lateral segment of left hepatic lobe and caudate lobe. Contour the liver is slightly nodular. No focal liver lesion identified. Previous cholecystectomy. Again noted are several tiny stones identified within the cystic duct remnant. Unchanged common bile duct dilatation which measures 1 cm in diameter. Pancreas: Changes of chronic pancreatitis with punctate  calcifications within the head of pancreas are again noted. No acute inflammatory changes identified within the pancreas. No pancreatic mass. Peripherally enhancing fluid collection associated with the tail of pancreas is again noted measuring 2.2 x 1.7 cm, image 36/2. This is compared with 3.5 by 2.0 cm previously. Large peripherally enhancing, multilocular fluid collection is seen within the left posterior pararenal space and extending into the left posterior abdominal wall. On today's study this measures 7.3 by 5.5 by 14.8 cm, image 44/2 and image 110/5. Previously this measured 7.3 x 5.7 by 14.6 cm. Spleen: Spleen is within normal limits measuring 12.7 cm cranial caudal. Adrenals/Urinary Tract: Normal adrenal glands. No kidney mass, nephrolithiasis, or hydronephrosis. Urinary bladder is unremarkable. Stomach/Bowel: Stomach appears nondistended. There is mild increase caliber of the transverse portion of the duodenum which measures up to 3.8 cm. The remaining small bowel loops are normal in caliber. No pathologic dilatation of the colon to suggest obstruction.  Vascular/Lymphatic: The portal vein, portal venous confluence and SMV are patent. 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 Gastroesophageal varices are again noted. No signs of abdominopelvic adenopathy. Aortic atherosclerosis without aneurysm. Reproductive: Uterus and bilateral adnexa are unremarkable. Other: There is a large volume of ascites within the abdomen and pelvis. When compared with the previous exam this is stable to mildly increased from the prior study. Musculoskeletal: No acute or significant osseous findings. IMPRESSION: 1. Similar size of peripherally enhancing large pseudocyst involving the left posterior pararenal space extending into the left posterior abdominal wall. Smaller pseudocyst within tail of pancreas is slightly decreased in size from previous exam. 2. Large volume of ascites within  the abdomen and pelvis. When compared with the previous exam this is stable to mildly increased from the prior study. 3. Cirrhosis with stigmata of portal venous hypertension. 4. Unchanged appearance of dilatation of the common bile duct which is likely secondary to stricture within the pancreatic head. 5. Changes of chronic pancreatitis identified. 6. 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 7. Aortic Atherosclerosis (ICD10-I70.0). Electronically Signed   By: Kerby Moors M.D.   On: 01/10/2022 13:34   CT ABDOMEN W WO CONTRAST  Result Date: 01/01/2022 CLINICAL DATA:  Worsening abdominal and back pain. Pancreatic pseudocysts and cirrhosis. EXAM: CT ABDOMEN WITHOUT AND WITH CONTRAST TECHNIQUE: Multidetector CT imaging of the abdomen was performed following the standard protocol before and following the bolus administration of intravenous contrast. RADIATION DOSE REDUCTION: This exam was performed according to the departmental dose-optimization program which includes automated exposure control, adjustment of the mA and/or kV according to patient size and/or use of iterative reconstruction technique. CONTRAST:  163mL OMNIPAQUE IOHEXOL 300 MG/ML  SOLN COMPARISON:  12/29/2021 FINDINGS: Lower chest: No acute findings. Hepatobiliary: Hepatic cirrhosis is again demonstrated. No evidence of hepatic mass. Recanalization of paraumbilical veins is consistent with portal venous hypertension. Prior cholecystectomy noted. A few tiny less than 5 mm calculi are again seen in the cystic duct remnant. Stable dilatation of the common bile duct measuring 1.6 cm, with probable stricture of the distal common bile duct within the pancreatic head. Pancreas: Scattered tiny punctate calcifications are seen in the pancreatic head, suspicious for chronic pancreatitis. No evidence acute peripancreatic inflammatory changes. No evidence of pancreatic mass. Rim enhancing fluid collection is again seen  involving the pancreatic tail measuring 3.5 x 2.0 cm, consistent with a small pseudocyst. A large multilocular rim enhancing fluid collection is again seen in the left posterior pararenal space. This measures 14 x 7 by 7.3 x 5.7 cm, without significant change, consistent with pancreatic pseudocysts. Moderate abdominal ascites shows no significant change. Spleen:  Within normal limits in size and appearance. Adrenals/Urinary Tract: No suspicious masses identified. No evidence of hydronephrosis. Stomach/Bowel: Unremarkable. Vascular/Lymphatic: No pathologically enlarged lymph nodes identified. No acute vascular findings. Chronic thrombosis of the proximal vein is seen with reconstitution of the distal splenic vein. Mild gastroesophageal varices are again seen, consistent with portal venous hypertension. Other:  None. Musculoskeletal:  No suspicious bone lesions identified. IMPRESSION: Stable pancreatic pseudocysts involving the pancreatic tail and in the left posterior pararenal space. Moderate ascites, without significant change. Stable hepatic cirrhosis and findings of portal venous hypertension. No evidence of hepatic neoplasm. Stable dilatation of common bile duct, with probable distal stricture within the pancreatic head. Chronic pancreatitis is suspected, and no pancreatic mass identified. Recommend correlation with liver function tests, and consider abdomen MRI and MRCP without and  with contrast for further evaluation if clinically warranted. Electronically Signed   By: Marlaine Hind M.D.   On: 01/01/2022 15:27   [place] FINE NEEDLE ASP 1ST LESION  Result Date: 12/29/2021 INDICATION: Left flank pain. Enlarging left upper quadrant retroperitoneal pancreatic pseudocyst, possibly involving body wall EXAM: ULTRASOUND ASPIRATION MEDICATIONS: No periprocedural antibiotics were indicated ANESTHESIA/SEDATION: Lidocaine 1% subcutaneous COMPLICATIONS: None immediate. PROCEDURE: Informed written consent was obtained from  the patient after a thorough discussion of the procedural risks, benefits and alternatives. All questions were addressed. Maximal Sterile Barrier Technique was utilized including caps, mask, sterile gowns, sterile gloves, sterile drape, hand hygiene and skin antiseptic. A timeout was performed prior to the initiation of the procedure. Ultrasound of the left upper quadrant retroperitoneum was performed and the complex collection was localized corresponding to CT findings. An appropriate skin entry site was determined and marked. The skin prepped with chlorhexidine, draped in usual sterile fashion, infiltrated locally with 1% lidocaine. 10 cm 5 [place] multi sidehole Yueh sheath needle advanced into the collection. 330 mL opaque tan fluid were aspirated, sample sent for the requested laboratory studies. Postprocedure scans show significant decompression of the complex collection with no significant large residual component evident on ultrasound. The patient tolerated the procedure well. IMPRESSION: 1. Technically successful ultrasound-guided aspiration of left upper quadrant retroperitoneal collection, removing 330 mL opaque tan fluid, sent for requested laboratory studies. Electronically Signed   By: Lucrezia Europe M.D.   On: 12/29/2021 17:09   CT ABDOMEN PELVIS W CONTRAST  Result Date: 12/29/2021 CLINICAL DATA:  pancreatic pseudocyst, follow-up study EXAM: CT ABDOMEN AND PELVIS WITH CONTRAST TECHNIQUE: Multidetector CT imaging of the abdomen and pelvis was performed using the standard protocol following bolus administration of intravenous contrast. RADIATION DOSE REDUCTION: This exam was performed according to the departmental dose-optimization program which includes automated exposure control, adjustment of the mA and/or kV according to patient size and/or use of iterative reconstruction technique. CONTRAST:  145mL OMNIPAQUE IOHEXOL 300 MG/ML  SOLN COMPARISON:  December 24, 2021 FINDINGS: Lower chest: Previously seen  small left pleural effusion and left basilar atelectasis has significantly improved in the interim. Again seen are the extensive coronary artery atheromatous calcifications. Hepatobiliary: Hepatic surface is nodular and lobulated and these morphological changes in keeping with cirrhosis. No discrete mass lesion is identified. Status post cholecystectomy. No intrahepatic biliary dilatation. Again seen is the severe dilatation of the CBD measuring approximately 1.7 cm, tapering within the pancreatic head likely related to changes of chronic pancreatitis and reservoir effect of cholecystectomy. Pancreas: Moderate pancreatic parenchymal atrophy. Intraparenchymal loculated fluid collection at the distal body and tail of the pancreas in keeping with a pancreatic pseudocyst measuring approximately 2.5 x 4 cm in comparison to 2.6 x 4.2 cm on the previous study. This fluid collection is in continuity with the fluid collection in the left posterior pararenal space which represent retroperitoneal/posterior parietal wall extension of the pancreatic pseudocyst. This rim enhancing septated collection measures approximately 9.1 x 7.5 cm in comparison to 8.8 x 8 cm on the previous study. The fluid pocket extending into the posterior parietal wall measures 9.2 x 3.7 cm (image 48/2) in comparison to 7 x 2.5 cm on the previous study and has increased. Spleen: Mild splenomegaly. Adrenals/Urinary Tract: Adrenal glands are unremarkable. Kidneys are normal, without renal calculi, focal lesion, or hydronephrosis except for the anterior and medial displacement of the left kidney of pseudocyst of the pancreas, stable. Bladder is unremarkable. Stomach/Bowel: Stomach is within normal limits. Appendix appears normal.  No evidence of bowel wall thickening, distention, or inflammatory changes. Moderately large stool burden in the colon. Vascular/Lymphatic: Aortic atherosclerosis. No enlarged abdominal or pelvic lymph nodes. Reproductive: Uterus  and bilateral adnexa are unremarkable. Other: There is a moderately large abdominopelvic ascites seen without significant interval change. Musculoskeletal: Mild lumbar spondylosis.  No fracture seen. IMPRESSION: 1. Again seen are the findings of cirrhosis of the liver and portal hypertension with small gastric varices, moderately large ascites and mild splenomegaly without significant interval change. 2. The pseudocyst at the distal body-tail of the pancreas has minimally decreased in the interim. The septated, rim enhancing fluid collection within the left posterior pararenal space measuring up to 12 cm in the greatest craniocaudad dimension is without significant interval change. The loculated pseudocyst component extending into the left posterior parietal wall has increased in the interim measuring 9.2 x 3.7 cm in comparison to 7 x 2.5 cm on the previous study. This is pseudocyst of the pancreas is amenable for a drain insertion in the appropriate clinical setting. 3.  Moderately large stool burden in the colon of constipation. 4.  Aortic atherosclerosis (ICD10-I70.0) Electronically Signed   By: Frazier Richards M.D.   On: 12/29/2021 12:51   US Paracentesis  Result Date: 12/27/2021 INDICATION: Patient with history of pancreatic pseudocyst, chronic pancreatitis, alcoholic cirrhosis, ascites, chronic kidney disease. Request received for diagnostic paracentesis up to 500 cc. EXAM: ULTRASOUND GUIDED DIAGNOSTIC PARACENTESIS MEDICATIONS: 8 mL 1% lidocaine COMPLICATIONS: None immediate. PROCEDURE: Informed written consent was obtained from the patient after a discussion of the risks, benefits and alternatives to treatment. A timeout was performed prior to the initiation of the procedure. Initial ultrasound scanning demonstrates a small to moderate amount of ascites within the right upper to mid abdominal quadrant. The right upper to mid abdomen was prepped and draped in the usual sterile fashion. 1% lidocaine was used  for local anesthesia. Following this, a 19 gauge, 7-cm, Yueh catheter was introduced. An ultrasound image was saved for documentation purposes. The paracentesis was performed. The catheter was removed and a dressing was applied. The patient tolerated the procedure well without immediate post procedural complication. FINDINGS: A total of approximately 500 cc of yellow fluid was removed. Samples were sent to the laboratory as requested by the clinical team. IMPRESSION: Successful ultrasound-guided diagnostic paracentesis yielding 500 cc of peritoneal fluid. PLAN: If the patient eventually requires >/=2 paracenteses in a 30 day period, candidacy for formal evaluation by the [facility] will be assessed. Read by: Rowe Robert, PA-C Electronically Signed   By: Jerilynn Mages.  Shick M.D.   On: 12/27/2021 14:01   US Paracentesis  Result Date: 12/26/2021 INDICATION: Patient with history of pancreatic pseudocyst, chronic pancreatitis, alcoholic cirrhosis, cirrhosis by imaging, ascites, chronic kidney disease; request received for diagnostic and therapeutic paracentesis up to 1 liter. EXAM: ULTRASOUND GUIDED DIAGNOSTIC AND THERAPEUTIC PARACENTESIS MEDICATIONS: 8 mL 1% lidocaine COMPLICATIONS: None immediate. PROCEDURE: Informed written consent was obtained from the patient after a discussion of the risks, benefits and alternatives to treatment. A timeout was performed prior to the initiation of the procedure. Initial ultrasound scanning demonstrates a small to moderate amount of ascites within the right upper to mid abdominal quadrant. The right upper to mid abdomen was prepped and draped in the usual sterile fashion. 1% lidocaine was used for local anesthesia. Following this, a 19 gauge, 7-cm, Yueh catheter was introduced. An ultrasound image was saved for documentation purposes. The paracentesis was performed. The catheter was removed and  a dressing was applied. The patient  tolerated the procedure well without immediate post procedural complication. FINDINGS: A total of approximately 1 liter of yellow fluid was removed. Samples were sent to the laboratory as requested by the clinical team. IMPRESSION: Successful ultrasound-guided diagnostic and therapeutic paracentesis yielding 1 liter of peritoneal fluid. PLAN: If the patient eventually requires >/=2 paracenteses in a 30 day period, candidacy for formal evaluation by the [facility] will be assessed. Read by: Jeananne Rama, PA-C Electronically Signed   By: Acquanetta Belling M.D.   On: 12/26/2021 13:11   CT ABDOMEN PELVIS W CONTRAST  Result Date: 12/24/2021 CLINICAL DATA:  Renal mass/cyst, indeterminate renal fluid collection EXAM: CT ABDOMEN AND PELVIS WITH CONTRAST TECHNIQUE: Multidetector CT imaging of the abdomen and pelvis was performed using the standard protocol following bolus administration of intravenous contrast. RADIATION DOSE REDUCTION: This exam was performed according to the departmental dose-optimization program which includes automated exposure control, adjustment of the mA and/or kV according to patient size and/or use of iterative reconstruction technique. CONTRAST:  OMNIPAQUE IOHEXOL 300 MG/ML  SOLN COMPARISON:  Noncontrast CT 12/24/2021, MRI 10/13/2021 FINDINGS: Lower chest: Small left pleural effusion with associated left basilar compressive atelectasis. Mild discoid atelectasis within the right lung base. Extensive coronary artery calcification. Global cardiac size within normal limits. Hepatobiliary: Morphologic changes in keeping with cirrhosis. Geographic peripheral areas of enhancement within the liver predominantly within segments 4 and 8 with associated capsular retraction may represent areas of confluent hepatic fibrosis. No definite intrahepatic enhancing mass identified. Stable moderate extrahepatic biliary ductal dilation, tapering within the  pancreatic head likely related to changes of chronic pancreatitis. Cholecystectomy has been performed. Pancreas: Mild pancreatic parenchymal atrophy. Intraparenchymal loculated fluid collection within the tail the pancreas in keeping with a pancreatic pseudocyst appears decreased when compared to remote prior MRI examination, measuring 2.7 x 4.2 cm at axial image # 31/2, but is in continuity with the fluid collection seen within the a left posterior pararenal space which represents retroperitoneal extension of the pancreatic pseudocyst. This rim enhancing, septated collection measures 8.0 x 8.9 x 12.9 cm in greatest dimension on axial image # 41 and coronal image # 92. Spleen: Unremarkable Adrenals/Urinary Tract: The adrenal glands are unremarkable. The kidneys are normal in size and position. Mild mass effect upon the left kidney related to the left posterior pararenal pseudocyst. No hydronephrosis. No enhancing intrarenal masses. No intrarenal or ureteral calculi. The bladder is decompressed. Stomach/Bowel: Mild sigmoid diverticulosis. Small gastric varices are noted within the gastric fundus. Stomach, small bowel, and large bowel are otherwise unremarkable. Appendix normal. Moderate ascites is present, increased in volume when compared to prior MRI examination. No free intraperitoneal gas. Vascular/Lymphatic: Aortic atherosclerosis. No enlarged abdominal or pelvic lymph nodes. Reproductive: Uterus and bilateral adnexa are unremarkable. Other: No abdominal wall hernia Musculoskeletal: No acute bone abnormality. IMPRESSION: 1. Morphologic changes in keeping with cirrhosis and portal venous hypertension with small gastric varices. Moderate ascites, increased in volume when compared to prior MRI examination. 2. Interval decrease in size of a pancreatic pseudocyst within the tail the pancreas since MRI examination 10/13/2021, however, there is now a septated, rim-enhancing fluid collection within the left posterior  pararenal space measuring up to 12.9 cm in greatest dimension. This is in continuity with the pancreatic pseudocyst and represents retroperitoneal extension of the pancreatic pseudocyst. 3. Extensive coronary artery calcification. 4. Mild sigmoid diverticulosis. Aortic Atherosclerosis (ICD10-I70.0). Electronically Signed   By: Lyda Kalata.D.  On: 12/24/2021 19:29   CT Renal Stone Study  Result Date: 12/24/2021 CLINICAL DATA:  Left flank swelling for the past 5 days. EXAM: CT ABDOMEN AND PELVIS WITHOUT CONTRAST TECHNIQUE: Multidetector CT imaging of the abdomen and pelvis was performed following the standard protocol without IV contrast. RADIATION DOSE REDUCTION: This exam was performed according to the departmental dose-optimization program which includes automated exposure control, adjustment of the mA and/or kV according to patient size and/or use of iterative reconstruction technique. COMPARISON:  10/13/2021 MRI FINDINGS: Lower chest: Right hemidiaphragm elevation. Bibasilar atelectasis. Normal heart size with right coronary artery calcification. Trace left pleural fluid. Hepatobiliary: Advanced cirrhosis, with caudate lobe enlargement. Cholecystectomy. Possible stones within the cystic duct remnant on 32/2. Common duct dilatation is suboptimally evaluated but felt to be less impressive than on the prior MRI. Pancreas: Suboptimally evaluated fluid collection adjacent the pancreatic tail measures on the order of 6.3 x 2.6 cm on 35/2 versus 6.2 x 3.8 cm when remeasured in a similar fashion on the prior MRI. Spleen: Normal noncontrast appearance including at 12.1 cm craniocaudal. Adrenals/Urinary Tract: Normal adrenal glands. Normal noncontrast appearance of the right kidney. A new fluid collection within the left perirenal space measures 7.4 x 4.8 cm on 36/2. An adjacent or contiguous more superficial component, extending into the left flank, is also new since the prior MRI at 5.5 x 3.2 cm on 39/2. No  hydronephrosis. No bladder calculi. Stomach/Bowel: Proximal gastric underdistention. Extensive colonic diverticulosis. Normal appendix. Normal small bowel caliber. Vascular/Lymphatic: Aortic atherosclerosis. Favor portosystemic collaterals within the omentum indicative of portal venous hypertension. No abdominopelvic adenopathy. Reproductive: Normal uterus and adnexa. Other: Moderate volume abdominopelvic ascites, similar to the prior MRI. No free intraperitoneal air. Anasarca is asymmetric left. Musculoskeletal: No acute osseous abnormality. IMPRESSION: 1. Limited exam secondary to stone study technique 2. Development of left perinephric fluid collection with adjacent component versus satellite collection extending into the left flank. Given clinical history, most likely related to progressive pseudocysts. 3. Relatively similar appearance of a presumed pseudocyst adjacent the pancreatic tail. 4. Cirrhosis with probable portal venous hypertension and moderate volume ascites. Small left pleural effusion. 5. Age advanced coronary artery atherosclerosis. Recommend assessment of coronary risk factors. 6. Cholecystectomy with possible stones within the cystic duct remnant. 7.  Aortic Atherosclerosis (ICD10-I70.0). Electronically Signed   By: Abigail Miyamoto M.D.   On: 12/24/2021 15:14   US Abdomen Complete  Result Date: 12/18/2021 CLINICAL DATA:  Cirrhosis. EXAM: ABDOMEN ULTRASOUND COMPLETE COMPARISON:  CT abdomen pelvis 08/22/2020 FINDINGS: Gallbladder: Status post cholecystectomy. Common bile duct: Diameter: Chronic enlargement: 14 mm which tapers to normal caliber distally. Liver: Nodular hepatic contour. No focal lesion identified. Within normal limits in parenchymal echogenicity. Portal vein is patent on color Doppler imaging with normal direction of blood flow towards the liver. IVC: No abnormality visualized. Pancreas: Poorly visualized with cystic lesion along the pancreatic tail measuring up to 1.3 cm. Spleen:  Enlarged. Right Kidney: Length: 10 cm. Echogenicity within normal limits. No mass or hydronephrosis visualized. Left Kidney: Length: 12 cm. Echogenicity within normal limits. No mass or hydronephrosis visualized. Abdominal aorta: No aneurysm visualized.  Atherosclerotic plaque. Other findings: At least small to moderate volume simple free fluid ascites. IMPRESSION: 1. Cirrhosis with portal hypertension. No focal liver lesions identified by ultrasound. Please note that liver protocol enhanced MR and CT are the most sensitive tests for the screening detection of hepatocellular carcinoma in the high risk setting of cirrhosis. 2. At least small to moderate volume simple free  fluid ascites. 3. Poorly visualized pancreas with query 1.3 cm cystic lesion along the tail. When the patient is clinically stable and able to follow directions and hold their breath (preferably as an outpatient) further evaluation with dedicated abdominal MRI pancreatic protocol should be considered. 4. Status post cholecystectomy with chronic dilated common bile which can be seen in the post cholecystectomy setting. 5.  Aortic Atherosclerosis (ICD10-I70.0). Electronically Signed   By: Iven Finn M.D.   On: 12/18/2021 19:23    Microbiology: Results for orders placed or performed during the hospital encounter of 12/24/21  Culture, blood (Routine X 2) w Reflex to ID Panel     Status: None   Collection Time: 12/24/21 10:50 PM   Specimen: BLOOD  Result Value Ref Range Status   Specimen Description   Final    BLOOD LEFT ANTECUBITAL Performed at [facility] [address], [place], [place] [address]    Special Requests   Final    BOTTLES DRAWN AEROBIC ONLY Blood Culture adequate volume Performed at [facility] [address], [place], [place] [address]    Culture   Final    NO GROWTH 5 DAYS Performed at [facility], [place] [address] [place], [place] [address]    Report Status  12/30/2021 FINAL  Final  Culture, blood (Routine X 2) w Reflex to ID Panel     Status: None   Collection Time: 12/24/21 10:50 PM   Specimen: BLOOD  Result Value Ref Range Status   Specimen Description   Final    BLOOD RIGHT ANTECUBITAL Performed at [facility] [address], [place], [place] [address]    Special Requests   Final    BOTTLES DRAWN AEROBIC AND ANAEROBIC Blood Culture adequate volume Performed at [facility] [address] [place], [place] [address]    Culture   Final    NO GROWTH 5 DAYS Performed at [facility], [place] [address] [place], [place] [address]    Report Status 12/30/2021 FINAL  Final  Body fluid culture w Gram Stain     Status: None   Collection Time: 12/26/21 11:59 AM   Specimen: PATH Cytology Peritoneal fluid  Result Value Ref Range Status   Specimen Description   Final    PERITONEAL Performed at [facility] [address], [place], [place] [address]    Special Requests   Final    NONE Performed at [facility], [place] [address], [place], [place] [address]    Gram Stain   Final    FEW WBC PRESENT, PREDOMINANTLY MONONUCLEAR NO ORGANISMS SEEN    Culture   Final    NO GROWTH 3 DAYS Performed at [facility] [address], [place], [place] [address]    Report Status 12/29/2021 FINAL  Final  Body fluid culture w Gram Stain     Status: None   Collection Time: 12/27/21 11:50 AM   Specimen: PATH Cytology Peritoneal fluid  Result Value Ref Range Status   Specimen Description   Final    PERITONEAL Performed at [facility] [address] [place], [place] [address]    Special Requests   Final    OTHER CELLS UNIDENTIFIED; SEE CYTOLOGY REPORT Performed at [facility] [address] [place], [place] [address]    Gram Stain   Final    WBC PRESENT, PREDOMINANTLY MONONUCLEAR NO ORGANISMS SEEN CYTOSPIN SMEAR  Culture   Final    NO GROWTH 3 DAYS Performed at [facility], [place] [address] [place], [place] [address]    Report Status 12/30/2021 FINAL  Final  Aerobic/Anaerobic Culture w Gram Stain (surgical/deep wound)     Status: None   Collection Time: 12/29/21  4:52 PM   Specimen: PATH Cytology Misc. fluid; Body Fluid  Result Value Ref Range Status   Specimen Description   Final    ABSCESS Performed at [facility] [address] [place], [place] [address]    Special Requests   Final    NONE POSTERIOR LUQ 330 ML ASPIRATE Performed at [facility] [address], [place], [place] [address]    Gram Stain   Final    ABUNDANT WBC PRESENT, PREDOMINANTLY PMN ABUNDANT GRAM VARIABLE ROD    Culture   Final    ABUNDANT KLEBSIELLA PNEUMONIAE NO ANAEROBES ISOLATED Performed at [facility], [place] [address] [place], [place] [address]    Report Status 01/03/2022 FINAL  Final   Organism ID, Bacteria KLEBSIELLA PNEUMONIAE  Final      Susceptibility   Klebsiella pneumoniae - MIC*    AMPICILLIN RESISTANT Resistant     CEFAZOLIN <=4 SENSITIVE Sensitive     CEFEPIME <=0.12 SENSITIVE Sensitive     CEFTAZIDIME <=1 SENSITIVE Sensitive     CEFTRIAXONE <=0.25 SENSITIVE Sensitive     CIPROFLOXACIN <=0.25 SENSITIVE Sensitive     GENTAMICIN <=1 SENSITIVE Sensitive     IMIPENEM <=0.25 SENSITIVE Sensitive     TRIMETH/SULFA <=20 SENSITIVE Sensitive     AMPICILLIN/SULBACTAM 4 SENSITIVE Sensitive     PIP/TAZO <=4 SENSITIVE Sensitive     * ABUNDANT KLEBSIELLA PNEUMONIAE    Labs: CBC: Recent Labs  Lab 01/10/22 1115 01/11/22 0721  WBC 10.4 8.5  NEUTROABS 8.9*  --   HGB 9.9* 11.0*  HCT 31.0* 35.8*  MCV 90.1 95.0  PLT 300 466   Basic Metabolic Panel: Recent Labs  Lab 01/10/22 1115 01/11/22 0721  NA 132* 133*  K 3.7 3.7  CL 97* 99  CO2 25 24  GLUCOSE 129* 73  BUN 8 7  CREATININE 0.98 0.87  CALCIUM 8.7* 8.4*   Liver Function Tests: Recent  Labs  Lab 01/10/22 1115 01/11/22 0721  AST 20 25  ALT <5 7  ALKPHOS 100 88  BILITOT 1.0 1.0  PROT 6.9 6.4*  ALBUMIN 2.7* 2.5*   CBG: Recent Labs  Lab 01/10/22 1734 01/10/22 2218 01/11/22 0750 01/11/22 0830 01/11/22 1204  GLUCAP 161* 144* 67* 83 165*    Discharge time spent: approximately 35 minutes spent on discharge counseling, evaluation of patient on day of discharge, and coordination of discharge planning with nursing, social work, pharmacy and case management  Signed: Edwin Dada, MD [facility] 01/11/2022

## 2022-01-11 NOTE — TOC Transition Note (Signed)
Transition of Care [facility]) - CM/SW Discharge Note   Patient Details  Name: Mallory Stewart MRN: [id number] Date of Birth: 1960/05/16  [facility] CM/SW Contact:  Golda Acre, RN Phone Number: 01/11/2022, 11:09 AM   Clinical Narrative:     Dcd to home with self care  Final next level of care: Home/Self Care Barriers to Discharge: Barriers Resolved   Patient Goals and CMS Choice Patient states their goals for this hospitalization and ongoing recovery are:: to go home      Discharge Placement                       Discharge Plan and Services   Discharge Planning Services: CM Consult                                 Social Determinants of Health (SDOH) Interventions     Readmission Risk Interventions   Row Labels 12/26/2021   12:51 PM  Readmission Risk Prevention Plan   Section Header. No data exists in this row.   Transportation Screening   Complete  PCP or Specialist Appt within 5-7 Days   Complete  Home Care Screening   Complete  Medication Review (RN CM)   Complete

## 2022-01-12 LAB — PATHOLOGIST SMEAR REVIEW

## 2022-01-12 NOTE — Telephone Encounter (Signed)
Please call patient to set up appointment for labs to draw c-Met on September 13.   See note from Dr. Carlean Purl.

## 2022-01-12 NOTE — Addendum Note (Signed)
Addended byKerby Nora E on: 01/12/2022 05:30 PM   Modules accepted: Orders

## 2022-01-12 NOTE — Telephone Encounter (Signed)
Very complicated situation.  Cirrhosis with chronic pancreatitis and pseudocyst and hyponatremia.  She wants to do her next labs at PCP, Amy she needs to have CMET done 9/13 please  She has been accepted at [facility] but is always in the hospital when her appointments come up.  We are trying to get help from them regarding management of cirrhosis.  We do not have therapeutic options for pseudocyst here in [place], it seems.  Given what we know about it it is not clear to me that tertiary center would be able to intervene either.  We will see how she is doing at follow-up on 9/19

## 2022-01-12 NOTE — Telephone Encounter (Signed)
Spoke with patient & she plans to have lab work completed next week at her PCP office since the location is closer. Labs orders should be available for office since they are through .

## 2022-01-14 ENCOUNTER — Other Ambulatory Visit: Payer: Self-pay | Admitting: Internal Medicine

## 2022-01-14 DIAGNOSIS — I10 Essential (primary) hypertension: Secondary | ICD-10-CM

## 2022-01-14 LAB — BODY FLUID CULTURE W GRAM STAIN
Culture: NO GROWTH
Gram Stain: NONE SEEN

## 2022-01-15 ENCOUNTER — Telehealth: Payer: Self-pay

## 2022-01-15 LAB — HM DIABETES EYE EXAM

## 2022-01-15 NOTE — Telephone Encounter (Signed)
Pt having labs done- no need to see Dr Ermalene Searing for fu per patient- has fu scheduled with GI.

## 2022-01-15 NOTE — Telephone Encounter (Signed)
Lab appointment scheduled 01/17/2022 at 8:00 am.  Orders in Epic.

## 2022-01-15 NOTE — Telephone Encounter (Signed)
Left message for [name] to call office and schedule a lab only appointment on Wednesday 01/17/22 for CMET.

## 2022-01-17 ENCOUNTER — Ambulatory Visit (INDEPENDENT_AMBULATORY_CARE_PROVIDER_SITE_OTHER): Payer: 59 | Admitting: Family Medicine

## 2022-01-17 ENCOUNTER — Encounter: Payer: Self-pay | Admitting: Family Medicine

## 2022-01-17 ENCOUNTER — Other Ambulatory Visit (INDEPENDENT_AMBULATORY_CARE_PROVIDER_SITE_OTHER): Payer: 59

## 2022-01-17 VITALS — BP 120/64 | HR 111 | Temp 97.3°F | Ht 69.0 in | Wt 150.0 lb

## 2022-01-17 DIAGNOSIS — E1165 Type 2 diabetes mellitus with hyperglycemia: Secondary | ICD-10-CM

## 2022-01-17 DIAGNOSIS — Z794 Long term (current) use of insulin: Secondary | ICD-10-CM | POA: Diagnosis not present

## 2022-01-17 DIAGNOSIS — E1159 Type 2 diabetes mellitus with other circulatory complications: Secondary | ICD-10-CM | POA: Diagnosis not present

## 2022-01-17 DIAGNOSIS — K7031 Alcoholic cirrhosis of liver with ascites: Secondary | ICD-10-CM

## 2022-01-17 DIAGNOSIS — K863 Pseudocyst of pancreas: Secondary | ICD-10-CM

## 2022-01-17 DIAGNOSIS — E871 Hypo-osmolality and hyponatremia: Secondary | ICD-10-CM

## 2022-01-17 DIAGNOSIS — R188 Other ascites: Secondary | ICD-10-CM

## 2022-01-17 LAB — COMPREHENSIVE METABOLIC PANEL
ALT: 7 U/L (ref 0–35)
AST: 22 U/L (ref 0–37)
Albumin: 3 g/dL — ABNORMAL LOW (ref 3.5–5.2)
Alkaline Phosphatase: 120 U/L — ABNORMAL HIGH (ref 39–117)
BUN: 11 mg/dL (ref 6–23)
CO2: 27 mEq/L (ref 19–32)
Calcium: 8.9 mg/dL (ref 8.4–10.5)
Chloride: 95 mEq/L — ABNORMAL LOW (ref 96–112)
Creatinine, Ser: 1.03 mg/dL (ref 0.40–1.20)
GFR: 59.73 mL/min — ABNORMAL LOW (ref >60.00)
Glucose, Bld: 114 mg/dL — ABNORMAL HIGH (ref 70–99)
Potassium: 4.6 mEq/L (ref 3.5–5.1)
Sodium: 132 mEq/L — ABNORMAL LOW (ref 135–145)
Total Bilirubin: 0.7 mg/dL (ref 0.2–1.2)
Total Protein: 7 g/dL (ref 6.0–8.3)

## 2022-01-17 LAB — POCT GLYCOSYLATED HEMOGLOBIN (HGB A1C): Hemoglobin A1C: 6 % — AB (ref 4.0–5.6)

## 2022-01-17 MED ORDER — TRAMADOL HCL 50 MG PO TABS: 50.0000 mg | ORAL_TABLET | Freq: Four times a day (QID) | ORAL | 0 refills | Status: DC | PRN

## 2022-01-17 NOTE — Assessment & Plan Note (Signed)
Acute, now improved status post paracentesis of 6 L.  She may have mild reaccumulation but it appears to be minimal at this time.  Unfortunately she had minimal improvement in her pain from this pancreatic pseudocyst after paracentesis.

## 2022-01-17 NOTE — Patient Instructions (Addendum)
Decrease  Novolog to 34 Units daily.. follow blood sugars at home... if continued persistent drops in AM fasting.. contact ENDO for further advice.  Use tramadol for pain as directed.  Keep follow ups as planned.  I will call or send a message with MyChart results of sodium back on spironolactone.

## 2022-01-17 NOTE — Assessment & Plan Note (Signed)
Chronic, improvement in the last several months with the addition by endocrinology of sliding scale insulin.  She has started in the last week having low fasting numbers.  We will temporarily decrease her NovoLog to 34 units daily.  She will continue sliding scale.  She will contact her endocrinologist if she continues to have hypoglycemia.  A1c today was improved at 6!

## 2022-01-17 NOTE — Assessment & Plan Note (Addendum)
Acute, causing severe pain in left flank.  She states tramadol 50 mg helps with pain.  This will be sent in for her to use every 6 hours.  PDMP reviewed without red flags today.  Has follow-up with GI to determine definitive treatment of pancreatic pseudocyst given severe pain.

## 2022-01-17 NOTE — Progress Notes (Signed)
Patient ID: Mallory Stewart, female    DOB: [birth year], 61 y.o.   MRN: [id number]  This visit was conducted in person.  BP 120/64   Pulse (!) 111   Temp (!) 97.3 F (36.3 C) (Oral)   Ht $R'5\' 9"'UD$  (1.753 m)   Wt 150 lb (68 kg)   SpO2 99%   BMI 22.15 kg/m    CC: Chief Complaint  Patient presents with   Back Pain    Would like to discuss pain medication for back     Subjective:   HPI: Mallory Stewart is a 61 y.o. female complicated past medical history presenting on 01/17/2022 for Back Pain (Would like to discuss pain medication for back )  Recent hospitalization for pancreatic pseudocyst from September 6 through September 7.  Diagnosis of alcoholic cirrhosis of the liver with ascites, chronic hyponatremia and type 2 diabetes.  Reviewed notes in detail.  In the ER, CT showed stable size of her pseudocyst, and normal WBC and no fever, but she was noted to have symptomatic ascites after recently reducing her diuretics due to symptomatic hyponatremia.    Hospital summary: Ascites Cirrhosis Patient was admitted overnight, underwent paracentesis of 6L, cell count ruled out SBP.    She felt symptomatic relief after paracentesis.  She was given 6 g/dL albumin and discharged.  Spironolactone was added back at 25 daily and furosemide continued every other day.   Pancreatic pseudocyst CT imaging showed this was stable.  This is a major concern for patient and husband.  I discussed with GI, and referral to tertiary care center is pending.    Chronic hyponatremia Sodium 132-134 here after starting spironolactone.    She has had severe symptomatic exacerbations of hyponatremia with diuretics before, and so they had recently been cut back - Cautiously restarted spironolactone - Check sodium weekly while titrating diuretics     Recommendations at discharge included   Follow up with Dr. Celesta Aver office on Sep 19th with [facility] labs checked in 1 week to check sodium and  adjust furosemide and spironolactone as indicated Dr. Diona Browner: Please check weekly sodium for next month while increasing diuretics Continue with referral to tertiary center for definitive management of pancreatic pseudocyst    Today she reports she is feeling better except for flank pain.  She denies any typical symptoms of low sodium.. no dizziness, no balance issues.   She is having extreme pain in flank from the pseudocyst. Had CMET checked this AM in our office.  Now on spironolactone daily and lasix every other day. She states that the paracentesis  did not decrease pain in back from pseudocyst.  Unable to put arm all the way down left arm, cannot  sleep at night, cannot lie flat against chair. Pain with ambulation. 10/10 pain on scale.  Tramadol 50 mg  every 6 hour was helping with pain at discharge from hospital... she is out of the medication. Has follow up next week with GI.   DM FBS: 64-82,  at mealtime 105-130  Using Humalog sliding scale prior to meals in last several months..   Using Novolog 36 units  in AMs.  Next visit with ENDO is 04/08/2022 Lab Results  Component Value Date   HGBA1C 12.1 (A) 11/20/2021     Relevant past medical, surgical, family and social history reviewed and updated as indicated. Interim medical history since our last visit reviewed. Allergies and medications reviewed and updated. Outpatient Medications Prior to Visit  Medication Sig Dispense Refill   aspirin EC 81 MG EC tablet Take 1 tablet (81 mg total) by mouth 2 (two) times daily. Swallow whole. 30 tablet 11   Blood Glucose Monitoring Suppl (ONE TOUCH ULTRA 2) w/Device KIT Use to check blood sugars two times day 1 each 0   Calcium Carb-Cholecalciferol (CALCIUM+D3 PO) Take 1 tablet by mouth daily.     cefadroxil (DURICEF) 500 MG capsule Take 2 capsules (1,000 mg total) by mouth 2 (two) times daily. (Patient not taking: Reported on 01/10/2022) 14 capsule 0   cetirizine (ZYRTEC) 10 MG tablet Take  10 mg by mouth at bedtime.     cyanocobalamin 2000 MCG tablet Take 2,000 mcg by mouth daily.     furosemide (LASIX) 20 MG tablet Take 1 tablet (20 mg total) by mouth every other day. (Patient taking differently: Take 20 mg by mouth See admin instructions. Take 20 mg by mouth every other morning)     glucose blood (ONETOUCH ULTRA) test strip CHECK BLOOD SUGAR TWICE DAILY AS DIRECTED 100 strip 3   Insulin Glargine (BASAGLAR KWIKPEN) 100 UNIT/ML Inject 36 Units into the skin daily. 45 mL 3   insulin lispro (HUMALOG KWIKPEN) 100 UNIT/ML KwikPen Inject 6-10 Units into the skin 3 (three) times daily as needed (high blood sugar). Max Daily 30 units. Sliding Scale (Patient taking differently: Inject 6-10 Units into the skin See admin instructions. Inject 6-10 units into the skin before meals as needed for elevated BGL, PER SLIDING SCALE)     Insulin Pen Needle 32G X 4 MM MISC 1 Device by Does not apply route in the morning, at noon, in the evening, and at bedtime. 400 each 3   Lancets (ONETOUCH ULTRASOFT) lancets Use to check blood sugar two times a day. 200 each 3   meclizine (ANTIVERT) 25 MG tablet Take 1/2 tablet po qhs prn vertigo (Patient taking differently: Take 25 mg by mouth daily as needed (for vertigo).) 20 tablet 0   Melatonin 10 MG TABS Take 20 mg by mouth at bedtime.     metroNIDAZOLE (FLAGYL) 500 MG tablet Take 1 tablet (500 mg total) by mouth every 12 (twelve) hours. (Patient not taking: Reported on 01/10/2022) 14 tablet 0   pantoprazole (PROTONIX) 40 MG tablet TAKE 1 TABLET BY MOUTH TWICE DAILY BEFORE A MEAL (Patient taking differently: Take 40 mg by mouth 2 (two) times daily.) 180 tablet 1   spironolactone (ALDACTONE) 25 MG tablet Take 1 tablet (25 mg total) by mouth daily. 30 tablet 3   vancomycin (VANCOCIN) 125 MG capsule Take 1 capsule (125 mg total) by mouth every 12 (twelve) hours. 20 capsule 0   No facility-administered medications prior to visit.     Per HPI unless specifically  indicated in ROS section below Review of Systems  Constitutional:  Negative for fatigue and fever.  HENT:  Negative for congestion.   Eyes:  Negative for pain.  Respiratory:  Negative for cough and shortness of breath.   Cardiovascular:  Negative for chest pain, palpitations and leg swelling.  Gastrointestinal:  Negative for abdominal pain.  Genitourinary:  Negative for dysuria and vaginal bleeding.  Musculoskeletal:  Positive for back pain.  Neurological:  Negative for syncope, light-headedness and headaches.  Psychiatric/Behavioral:  Negative for dysphoric mood.    Objective:  BP 120/64   Pulse (!) 111   Temp (!) 97.3 F (36.3 C) (Oral)   Ht $R'5\' 9"'Qn$  (1.753 m)   Wt 150 lb (68 kg)   SpO2  99%   BMI 22.15 kg/m   Wt Readings from Last 3 Encounters:  01/17/22 150 lb (68 kg)  01/10/22 140 lb (63.5 kg)  12/28/21 142 lb (64.4 kg)      Physical Exam Vitals and nursing note reviewed.  Constitutional:      General: She is not in acute distress.    Appearance: Normal appearance. She is well-developed. She is not ill-appearing or toxic-appearing.  HENT:     Head: Normocephalic.     Right Ear: Hearing, tympanic membrane, ear canal and external ear normal.     Left Ear: Hearing, tympanic membrane, ear canal and external ear normal.     Nose: Nose normal.  Eyes:     General: Lids are normal. Lids are everted, no foreign bodies appreciated.     Conjunctiva/sclera: Conjunctivae normal.     Pupils: Pupils are equal, round, and reactive to light.  Neck:     Thyroid: No thyroid mass or thyromegaly.     Vascular: No carotid bruit.     Trachea: Trachea normal.  Cardiovascular:     Rate and Rhythm: Normal rate and regular rhythm.     Heart sounds: Normal heart sounds, S1 normal and S2 normal. No murmur heard.    No gallop.  Pulmonary:     Effort: Pulmonary effort is normal. No respiratory distress.     Breath sounds: Normal breath sounds. No wheezing, rhonchi or rales.  Abdominal:      General: Bowel sounds are normal. There is no distension or abdominal bruit.     Palpations: Abdomen is soft. There is no fluid wave or mass.     Tenderness: There is no abdominal tenderness. There is no guarding or rebound.     Hernia: No hernia is present.  Musculoskeletal:     Cervical back: Normal range of motion and neck supple.  Lymphadenopathy:     Cervical: No cervical adenopathy.  Skin:    General: Skin is warm and dry.     Findings: No rash.  Neurological:     Mental Status: She is alert.     Cranial Nerves: No cranial nerve deficit.     Sensory: No sensory deficit.  Psychiatric:        Mood and Affect: Mood is not anxious or depressed.        Speech: Speech normal.        Behavior: Behavior normal. Behavior is cooperative.        Judgment: Judgment normal.       Results for orders placed or performed during the hospital encounter of 01/10/22  Body fluid culture w Gram Stain   Specimen: PATH Cytology Peritoneal fluid  Result Value Ref Range   Specimen Description      PERITONEAL Performed at [facility] [address], [place], [place] [address]    Special Requests      NONE Performed at [facility], [place] [address], [place], [place] [address]    Gram Stain NO ORGANISMS SEEN NO WBC SEEN     Culture      NO GROWTH 3 DAYS Performed at [facility], [place] [address] [place], [place] [address]    Report Status 01/14/2022 FINAL   Comprehensive metabolic panel  Result Value Ref Range   Sodium 132 (L) 135 - 145 mmol/L   Potassium 3.7 3.5 - 5.1 mmol/L   Chloride 97 (L) 98 - 111 mmol/L   CO2 25 22 - 32  mmol/L   Glucose, Bld 129 (H) 70 - 99 mg/dL   BUN 8 6 - 20 mg/dL   Creatinine, Ser 0.98 0.44 - 1.00 mg/dL   Calcium 8.7 (L) 8.9 - 10.3 mg/dL   Total Protein 6.9 6.5 - 8.1 g/dL   Albumin 2.7 (L) 3.5 - 5.0 g/dL   AST 20 15 - 41 U/L   ALT <5 0 - 44 U/L   Alkaline Phosphatase 100 38 - 126 U/L   Total Bilirubin 1.0 0.3 -  1.2 mg/dL   GFR, Estimated >60 >60 mL/min   Anion gap 10 5 - 15  Protime-INR  Result Value Ref Range   Prothrombin Time 16.8 (H) 11.4 - 15.2 seconds   INR 1.4 (H) 0.8 - 1.2  CBC with Differential  Result Value Ref Range   WBC 10.4 4.0 - 10.5 K/uL   RBC 3.44 (L) 3.87 - 5.11 MIL/uL   Hemoglobin 9.9 (L) 12.0 - 15.0 g/dL   HCT 31.0 (L) 36.0 - 46.0 %   MCV 90.1 80.0 - 100.0 fL   MCH 28.8 26.0 - 34.0 pg   MCHC 31.9 30.0 - 36.0 g/dL   RDW 14.6 11.5 - 15.5 %   Platelets 300 150 - 400 K/uL   nRBC 0.0 0.0 - 0.2 %   Neutrophils Relative % 86 %   Neutro Abs 8.9 (H) 1.7 - 7.7 K/uL   Lymphocytes Relative 9 %   Lymphs Abs 0.9 0.7 - 4.0 K/uL   Monocytes Relative 4 %   Monocytes Absolute 0.5 0.1 - 1.0 K/uL   Eosinophils Relative 0 %   Eosinophils Absolute 0.0 0.0 - 0.5 K/uL   Basophils Relative 0 %   Basophils Absolute 0.0 0.0 - 0.1 K/uL   Immature Granulocytes 1 %   Abs Immature Granulocytes 0.06 0.00 - 0.07 K/uL  Lipase, blood  Result Value Ref Range   Lipase 22 11 - 51 U/L  Ammonia  Result Value Ref Range   Ammonia <10 9 - 35 umol/L  Comprehensive metabolic panel  Result Value Ref Range   Sodium 133 (L) 135 - 145 mmol/L   Potassium 3.7 3.5 - 5.1 mmol/L   Chloride 99 98 - 111 mmol/L   CO2 24 22 - 32 mmol/L   Glucose, Bld 73 70 - 99 mg/dL   BUN 7 6 - 20 mg/dL   Creatinine, Ser 0.87 0.44 - 1.00 mg/dL   Calcium 8.4 (L) 8.9 - 10.3 mg/dL   Total Protein 6.4 (L) 6.5 - 8.1 g/dL   Albumin 2.5 (L) 3.5 - 5.0 g/dL   AST 25 15 - 41 U/L   ALT 7 0 - 44 U/L   Alkaline Phosphatase 88 38 - 126 U/L   Total Bilirubin 1.0 0.3 - 1.2 mg/dL   GFR, Estimated >60 >60 mL/min   Anion gap 10 5 - 15  CBC  Result Value Ref Range   WBC 8.5 4.0 - 10.5 K/uL   RBC 3.77 (L) 3.87 - 5.11 MIL/uL   Hemoglobin 11.0 (L) 12.0 - 15.0 g/dL   HCT 35.8 (L) 36.0 - 46.0 %   MCV 95.0 80.0 - 100.0 fL   MCH 29.2 26.0 - 34.0 pg   MCHC 30.7 30.0 - 36.0 g/dL   RDW 14.9 11.5 - 15.5 %   Platelets 260 150 - 400 K/uL    nRBC 0.0 0.0 - 0.2 %  Glucose, capillary  Result Value Ref Range   Glucose-Capillary 144 (H) 70 - 99 mg/dL  Glucose, capillary  Result Value Ref Range   Glucose-Capillary 67 (L) 70 - 99 mg/dL  Glucose, capillary  Result Value Ref Range   Glucose-Capillary 83 70 - 99 mg/dL  Body fluid cell count with differential  Result Value Ref Range   Fluid Type-FCT CYTO PERI    Color, Fluid YELLOW (A) YELLOW   Appearance, Fluid HAZY (A) CLEAR   Total Nucleated Cell Count, Fluid 193 0 - 1,000 cu mm   Neutrophil Count, Fluid 18 0 - 25 %   Lymphs, Fluid 34 %   Monocyte-Macrophage-Serous Fluid 48 (L) 50 - 90 %   Eos, Fluid 0 %   Other Cells, Fluid OTHER CELLS IDENTIFIED AS MESOTHELIAL CELLS %  Albumin, pleural or peritoneal fluid   Result Value Ref Range   Albumin, Fluid <1.5 g/dL   Fluid Type-FALB CYTO PERI   Protein, pleural or peritoneal fluid  Result Value Ref Range   Total protein, fluid <3.0 g/dL   Fluid Type-FTP CYTO PERI   Glucose, capillary  Result Value Ref Range   Glucose-Capillary 165 (H) 70 - 99 mg/dL  Pathologist smear review  Result Value Ref Range   Path Review Reviewed By Violet Baldy, M.D.   CBG monitoring, ED  Result Value Ref Range   Glucose-Capillary 63 (L) 70 - 99 mg/dL  CBG monitoring, ED  Result Value Ref Range   Glucose-Capillary 55 (L) 70 - 99 mg/dL  CBG monitoring, ED  Result Value Ref Range   Glucose-Capillary 161 (H) 70 - 99 mg/dL     COVID 19 screen:  No recent travel or known exposure to COVID19 The patient denies respiratory symptoms of COVID 19 at this time. The importance of social distancing was discussed today.   Assessment and Plan    Problem List Items Addressed This Visit     Alcoholic cirrhosis of liver with ascites ([place])   Ascites    Acute, now improved status post paracentesis of 6 L.  She may have mild reaccumulation but it appears to be minimal at this time.  Unfortunately she had minimal improvement in her pain from this  pancreatic pseudocyst after paracentesis.      Diabetes mellitus with circulatory complication, HTN (HCC)   Relevant Orders   POCT glycosylated hemoglobin (Hb A1C) (Completed)   Hyponatremia - Primary    Acute on chronic issue  Pending reevaluation today with labs back on spironolactone and with continued Lasix twice daily.  Did discuss how tramadol has listed as a side effect hyponatremia.  We will continue to watch this closely and she will come off it soon as a definitive treatment for the pancreatic pseudocyst is determined      Pancreatic pseudocyst    Acute, causing severe pain in left flank.  She states tramadol 50 mg helps with pain.  This will be sent in for her to use every 6 hours.  PDMP reviewed without red flags today.  Has follow-up with GI to determine definitive treatment of pancreatic pseudocyst given severe pain.      Type 2 diabetes mellitus with hyperglycemia, with long-term current use of insulin (HCC)    Chronic, improvement in the last several months with the addition by endocrinology of sliding scale insulin.  She has started in the last week having low fasting numbers.  We will temporarily decrease her NovoLog to 34 units daily.  She will continue sliding scale.  She will contact her endocrinologist if she continues to have hypoglycemia.  A1c today was  improved at 6!        Eliezer Lofts, MD

## 2022-01-17 NOTE — Assessment & Plan Note (Signed)
Acute on chronic issue  Pending reevaluation today with labs back on spironolactone and with continued Lasix twice daily.  Did discuss how tramadol has listed as a side effect hyponatremia.  We will continue to watch this closely and she will come off it soon as a definitive treatment for the pancreatic pseudocyst is determined

## 2022-01-18 NOTE — Telephone Encounter (Signed)
Appt has been scheduled by patient per appt desk Nothing further needed.

## 2022-01-19 ENCOUNTER — Telehealth: Payer: Self-pay

## 2022-01-19 NOTE — Telephone Encounter (Signed)
Patient states that her blood sugar was 59 this morning fasting. Patient states that her blood sugar has been no higher than 75 in the mornings. Primary care decreased Basaglar 36 to 34 on Wednesday. Patient did drink a little coke after checking. Please advise

## 2022-01-19 NOTE — Telephone Encounter (Signed)
Patient advised and will make adjustment to 30 units daily and she if sugars improve.

## 2022-01-22 ENCOUNTER — Emergency Department (HOSPITAL_COMMUNITY): Payer: 59

## 2022-01-22 ENCOUNTER — Inpatient Hospital Stay (HOSPITAL_COMMUNITY)
Admission: EM | Admit: 2022-01-22 | Discharge: 2022-01-29 | DRG: 871 | Disposition: A | Discharge status: Home / Self Care | Payer: 59 | Attending: Internal Medicine | Admitting: Internal Medicine

## 2022-01-22 ENCOUNTER — Encounter (HOSPITAL_COMMUNITY): Payer: Self-pay | Admitting: Internal Medicine

## 2022-01-22 ENCOUNTER — Other Ambulatory Visit: Payer: Self-pay

## 2022-01-22 DIAGNOSIS — A419 Sepsis, unspecified organism: Secondary | ICD-10-CM | POA: Diagnosis not present

## 2022-01-22 DIAGNOSIS — K7031 Alcoholic cirrhosis of liver with ascites: Secondary | ICD-10-CM | POA: Diagnosis present

## 2022-01-22 DIAGNOSIS — K8689 Other specified diseases of pancreas: Secondary | ICD-10-CM

## 2022-01-22 DIAGNOSIS — K831 Obstruction of bile duct: Secondary | ICD-10-CM

## 2022-01-22 DIAGNOSIS — R7989 Other specified abnormal findings of blood chemistry: Secondary | ICD-10-CM

## 2022-01-22 DIAGNOSIS — N182 Chronic kidney disease, stage 2 (mild): Secondary | ICD-10-CM | POA: Diagnosis present

## 2022-01-22 DIAGNOSIS — K766 Portal hypertension: Secondary | ICD-10-CM | POA: Diagnosis present

## 2022-01-22 DIAGNOSIS — K7290 Hepatic failure, unspecified without coma: Secondary | ICD-10-CM | POA: Diagnosis not present

## 2022-01-22 DIAGNOSIS — Z888 Allergy status to other drugs, medicaments and biological substances status: Secondary | ICD-10-CM

## 2022-01-22 DIAGNOSIS — Z7982 Long term (current) use of aspirin: Secondary | ICD-10-CM | POA: Diagnosis not present

## 2022-01-22 DIAGNOSIS — E1140 Type 2 diabetes mellitus with diabetic neuropathy, unspecified: Secondary | ICD-10-CM | POA: Diagnosis present

## 2022-01-22 DIAGNOSIS — Z1211 Encounter for screening for malignant neoplasm of colon: Secondary | ICD-10-CM

## 2022-01-22 DIAGNOSIS — T7840XA Allergy, unspecified, initial encounter: Secondary | ICD-10-CM

## 2022-01-22 DIAGNOSIS — E1165 Type 2 diabetes mellitus with hyperglycemia: Secondary | ICD-10-CM | POA: Diagnosis present

## 2022-01-22 DIAGNOSIS — Z803 Family history of malignant neoplasm of breast: Secondary | ICD-10-CM

## 2022-01-22 DIAGNOSIS — Z823 Family history of stroke: Secondary | ICD-10-CM

## 2022-01-22 DIAGNOSIS — E871 Hypo-osmolality and hyponatremia: Secondary | ICD-10-CM | POA: Diagnosis present

## 2022-01-22 DIAGNOSIS — Z808 Family history of malignant neoplasm of other organs or systems: Secondary | ICD-10-CM

## 2022-01-22 DIAGNOSIS — L0390 Cellulitis, unspecified: Secondary | ICD-10-CM

## 2022-01-22 DIAGNOSIS — K860 Alcohol-induced chronic pancreatitis: Secondary | ICD-10-CM

## 2022-01-22 DIAGNOSIS — Z794 Long term (current) use of insulin: Secondary | ICD-10-CM

## 2022-01-22 DIAGNOSIS — K863 Pseudocyst of pancreas: Secondary | ICD-10-CM | POA: Diagnosis not present

## 2022-01-22 DIAGNOSIS — Z79899 Other long term (current) drug therapy: Secondary | ICD-10-CM

## 2022-01-22 DIAGNOSIS — M5030 Other cervical disc degeneration, unspecified cervical region: Secondary | ICD-10-CM | POA: Diagnosis present

## 2022-01-22 DIAGNOSIS — I340 Nonrheumatic mitral (valve) insufficiency: Secondary | ICD-10-CM | POA: Diagnosis not present

## 2022-01-22 DIAGNOSIS — R161 Splenomegaly, not elsewhere classified: Secondary | ICD-10-CM | POA: Diagnosis present

## 2022-01-22 DIAGNOSIS — E1122 Type 2 diabetes mellitus with diabetic chronic kidney disease: Secondary | ICD-10-CM | POA: Diagnosis present

## 2022-01-22 DIAGNOSIS — L03319 Cellulitis of trunk, unspecified: Secondary | ICD-10-CM | POA: Diagnosis not present

## 2022-01-22 DIAGNOSIS — L02219 Cutaneous abscess of trunk, unspecified: Secondary | ICD-10-CM

## 2022-01-22 DIAGNOSIS — I129 Hypertensive chronic kidney disease with stage 1 through stage 4 chronic kidney disease, or unspecified chronic kidney disease: Secondary | ICD-10-CM | POA: Diagnosis present

## 2022-01-22 DIAGNOSIS — K859 Acute pancreatitis without necrosis or infection, unspecified: Secondary | ICD-10-CM

## 2022-01-22 DIAGNOSIS — Z880 Allergy status to penicillin: Secondary | ICD-10-CM

## 2022-01-22 DIAGNOSIS — Z91018 Allergy to other foods: Secondary | ICD-10-CM

## 2022-01-22 DIAGNOSIS — L02212 Cutaneous abscess of back [any part, except buttock]: Secondary | ICD-10-CM | POA: Diagnosis present

## 2022-01-22 DIAGNOSIS — R6520 Severe sepsis without septic shock: Secondary | ICD-10-CM | POA: Diagnosis not present

## 2022-01-22 DIAGNOSIS — R188 Other ascites: Secondary | ICD-10-CM | POA: Diagnosis present

## 2022-01-22 DIAGNOSIS — K7460 Unspecified cirrhosis of liver: Secondary | ICD-10-CM

## 2022-01-22 DIAGNOSIS — Z8042 Family history of malignant neoplasm of prostate: Secondary | ICD-10-CM

## 2022-01-22 DIAGNOSIS — D631 Anemia in chronic kidney disease: Secondary | ICD-10-CM | POA: Diagnosis present

## 2022-01-22 DIAGNOSIS — Z5181 Encounter for therapeutic drug level monitoring: Secondary | ICD-10-CM | POA: Diagnosis not present

## 2022-01-22 DIAGNOSIS — A4159 Other Gram-negative sepsis: Principal | ICD-10-CM | POA: Diagnosis present

## 2022-01-22 DIAGNOSIS — A498 Other bacterial infections of unspecified site: Secondary | ICD-10-CM

## 2022-01-22 DIAGNOSIS — I8500 Esophageal varices without bleeding: Secondary | ICD-10-CM | POA: Diagnosis present

## 2022-01-22 DIAGNOSIS — F1010 Alcohol abuse, uncomplicated: Secondary | ICD-10-CM | POA: Diagnosis present

## 2022-01-22 DIAGNOSIS — K6819 Other retroperitoneal abscess: Secondary | ICD-10-CM

## 2022-01-22 DIAGNOSIS — L03312 Cellulitis of back [any part except buttock]: Secondary | ICD-10-CM

## 2022-01-22 DIAGNOSIS — I864 Gastric varices: Secondary | ICD-10-CM | POA: Diagnosis present

## 2022-01-22 DIAGNOSIS — R935 Abnormal findings on diagnostic imaging of other abdominal regions, including retroperitoneum: Secondary | ICD-10-CM

## 2022-01-22 DIAGNOSIS — Z9049 Acquired absence of other specified parts of digestive tract: Secondary | ICD-10-CM

## 2022-01-22 DIAGNOSIS — I10 Essential (primary) hypertension: Secondary | ICD-10-CM | POA: Diagnosis not present

## 2022-01-22 DIAGNOSIS — L270 Generalized skin eruption due to drugs and medicaments taken internally: Secondary | ICD-10-CM | POA: Diagnosis present

## 2022-01-22 DIAGNOSIS — Z8249 Family history of ischemic heart disease and other diseases of the circulatory system: Secondary | ICD-10-CM

## 2022-01-22 DIAGNOSIS — L02211 Cutaneous abscess of abdominal wall: Secondary | ICD-10-CM | POA: Diagnosis not present

## 2022-01-22 DIAGNOSIS — T361X5A Adverse effect of cephalosporins and other beta-lactam antibiotics, initial encounter: Secondary | ICD-10-CM | POA: Diagnosis present

## 2022-01-22 DIAGNOSIS — E119 Type 2 diabetes mellitus without complications: Secondary | ICD-10-CM | POA: Diagnosis not present

## 2022-01-22 DIAGNOSIS — I361 Nonrheumatic tricuspid (valve) insufficiency: Secondary | ICD-10-CM | POA: Diagnosis not present

## 2022-01-22 DIAGNOSIS — K838 Other specified diseases of biliary tract: Secondary | ICD-10-CM | POA: Diagnosis not present

## 2022-01-22 DIAGNOSIS — Z881 Allergy status to other antibiotic agents status: Secondary | ICD-10-CM

## 2022-01-22 LAB — URINALYSIS, ROUTINE W REFLEX MICROSCOPIC
Bilirubin Urine: NEGATIVE
Glucose, UA: NEGATIVE mg/dL
Ketones, ur: NEGATIVE mg/dL
Leukocytes,Ua: NEGATIVE
Nitrite: NEGATIVE
Protein, ur: NEGATIVE mg/dL
Specific Gravity, Urine: 1.016 (ref 1.005–1.030)
pH: 6 (ref 5.0–8.0)

## 2022-01-22 LAB — COMPREHENSIVE METABOLIC PANEL
ALT: 9 U/L (ref 0–44)
AST: 20 U/L (ref 15–41)
Albumin: 2.5 g/dL — ABNORMAL LOW (ref 3.5–5.0)
Alkaline Phosphatase: 116 U/L (ref 38–126)
Anion gap: 8 (ref 5–15)
BUN: 10 mg/dL (ref 6–20)
CO2: 25 mmol/L (ref 22–32)
Calcium: 8.4 mg/dL — ABNORMAL LOW (ref 8.9–10.3)
Chloride: 93 mmol/L — ABNORMAL LOW (ref 98–111)
Creatinine, Ser: 0.97 mg/dL (ref 0.44–1.00)
GFR, Estimated: >60 mL/min (ref >60)
Glucose, Bld: 174 mg/dL — ABNORMAL HIGH (ref 70–99)
Potassium: 3.7 mmol/L (ref 3.5–5.1)
Sodium: 126 mmol/L — ABNORMAL LOW (ref 135–145)
Total Bilirubin: 1.3 mg/dL — ABNORMAL HIGH (ref 0.3–1.2)
Total Protein: 6.6 g/dL (ref 6.5–8.1)

## 2022-01-22 LAB — CBC WITH DIFFERENTIAL/PLATELET
Abs Immature Granulocytes: 0.11 10*3/uL — ABNORMAL HIGH (ref 0.00–0.07)
Basophils Absolute: 0 10*3/uL (ref 0.0–0.1)
Basophils Relative: 0 %
Eosinophils Absolute: 0 10*3/uL (ref 0.0–0.5)
Eosinophils Relative: 0 %
HCT: 30.7 % — ABNORMAL LOW (ref 36.0–46.0)
Hemoglobin: 10 g/dL — ABNORMAL LOW (ref 12.0–15.0)
Immature Granulocytes: 1 %
Lymphocytes Relative: 5 %
Lymphs Abs: 0.8 10*3/uL (ref 0.7–4.0)
MCH: 28.6 pg (ref 26.0–34.0)
MCHC: 32.6 g/dL (ref 30.0–36.0)
MCV: 87.7 fL (ref 80.0–100.0)
Monocytes Absolute: 1.1 10*3/uL — ABNORMAL HIGH (ref 0.1–1.0)
Monocytes Relative: 7 %
Neutro Abs: 13.6 10*3/uL — ABNORMAL HIGH (ref 1.7–7.7)
Neutrophils Relative %: 87 %
Platelets: 261 10*3/uL (ref 150–400)
RBC: 3.5 MIL/uL — ABNORMAL LOW (ref 3.87–5.11)
RDW: 13.2 % (ref 11.5–15.5)
WBC: 15.7 10*3/uL — ABNORMAL HIGH (ref 4.0–10.5)
nRBC: 0 % (ref 0.0–0.2)

## 2022-01-22 LAB — LACTIC ACID, PLASMA
Lactic Acid, Venous: 2 mmol/L (ref 0.5–1.9)
Lactic Acid, Venous: 2 mmol/L (ref 0.5–1.9)

## 2022-01-22 LAB — BASIC METABOLIC PANEL
Anion gap: 8 (ref 5–15)
BUN: 10 mg/dL (ref 6–20)
CO2: 26 mmol/L (ref 22–32)
Calcium: 8.5 mg/dL — ABNORMAL LOW (ref 8.9–10.3)
Chloride: 94 mmol/L — ABNORMAL LOW (ref 98–111)
Creatinine, Ser: 0.91 mg/dL (ref 0.44–1.00)
GFR, Estimated: >60 mL/min (ref >60)
Glucose, Bld: 110 mg/dL — ABNORMAL HIGH (ref 70–99)
Potassium: 3.7 mmol/L (ref 3.5–5.1)
Sodium: 128 mmol/L — ABNORMAL LOW (ref 135–145)

## 2022-01-22 LAB — TSH: TSH: 8.744 u[IU]/mL — ABNORMAL HIGH (ref 0.350–4.500)

## 2022-01-22 LAB — PROTIME-INR
INR: 1.5 — ABNORMAL HIGH (ref 0.8–1.2)
Prothrombin Time: 17.8 seconds — ABNORMAL HIGH (ref 11.4–15.2)

## 2022-01-22 LAB — NA AND K (SODIUM & POTASSIUM), RAND UR
Potassium Urine: 14 mmol/L
Sodium, Ur: 70 mmol/L

## 2022-01-22 LAB — GLUCOSE, CAPILLARY
Glucose-Capillary: 97 mg/dL (ref 70–99)
Glucose-Capillary: 170 mg/dL — ABNORMAL HIGH (ref 70–99)

## 2022-01-22 LAB — APTT: aPTT: 32 seconds (ref 24–36)

## 2022-01-22 LAB — LIPASE, BLOOD: Lipase: 18 U/L (ref 11–51)

## 2022-01-22 LAB — OSMOLALITY, URINE: Osmolality, Ur: 264 mOsm/kg — ABNORMAL LOW (ref 300–900)

## 2022-01-22 LAB — OSMOLALITY: Osmolality: 266 mOsm/kg — ABNORMAL LOW (ref 275–295)

## 2022-01-22 LAB — CBG MONITORING, ED: Glucose-Capillary: 93 mg/dL (ref 70–99)

## 2022-01-22 LAB — BRAIN NATRIURETIC PEPTIDE: B Natriuretic Peptide: 57.8 pg/mL (ref 0.0–100.0)

## 2022-01-22 MED ORDER — HYDROMORPHONE HCL 1 MG/ML IJ SOLN
0.5000 mg | Freq: Once | INTRAMUSCULAR | Status: AC
  Administered 2022-01-22: 0.5 mg via INTRAVENOUS
  Filled 2022-01-22: qty 1

## 2022-01-22 MED ORDER — LACTATED RINGERS IV BOLUS
1000.0000 mL | Freq: Once | INTRAVENOUS | Status: AC
  Administered 2022-01-22: 1000 mL via INTRAVENOUS

## 2022-01-22 MED ORDER — DIPHENHYDRAMINE HCL 50 MG/ML IJ SOLN: 25.0000 mg | Freq: Four times a day (QID) | INTRAMUSCULAR | Status: DC | PRN

## 2022-01-22 MED ORDER — ASPIRIN 81 MG PO TBEC
81.0000 mg | DELAYED_RELEASE_TABLET | Freq: Two times a day (BID) | ORAL | Status: DC
  Administered 2022-01-22 – 2022-01-23 (×3): 81 mg via ORAL
  Filled 2022-01-22 (×3): qty 1

## 2022-01-22 MED ORDER — IOHEXOL 300 MG/ML  SOLN
100.0000 mL | Freq: Once | INTRAMUSCULAR | Status: AC | PRN
  Administered 2022-01-22: 100 mL via INTRAVENOUS

## 2022-01-22 MED ORDER — FAMOTIDINE IN NACL 20-0.9 MG/50ML-% IV SOLN
20.0000 mg | Freq: Two times a day (BID) | INTRAVENOUS | Status: AC
  Administered 2022-01-22 – 2022-01-23 (×2): 20 mg via INTRAVENOUS
  Filled 2022-01-22 (×2): qty 50

## 2022-01-22 MED ORDER — SODIUM CHLORIDE 0.9 % IV SOLN
2.0000 g | Freq: Once | INTRAVENOUS | Status: AC
  Administered 2022-01-22: 2 g via INTRAVENOUS
  Filled 2022-01-22: qty 20

## 2022-01-22 MED ORDER — HYDROMORPHONE HCL 1 MG/ML IJ SOLN
1.0000 mg | Freq: Once | INTRAMUSCULAR | Status: AC
  Administered 2022-01-22: 1 mg via INTRAVENOUS
  Filled 2022-01-22: qty 1

## 2022-01-22 MED ORDER — DEXAMETHASONE SODIUM PHOSPHATE 10 MG/ML IJ SOLN
10.0000 mg | Freq: Once | INTRAMUSCULAR | Status: AC
  Administered 2022-01-22: 10 mg via INTRAVENOUS
  Filled 2022-01-22: qty 1

## 2022-01-22 MED ORDER — SODIUM CHLORIDE 0.9 % IV SOLN
3.0000 g | Freq: Four times a day (QID) | INTRAVENOUS | Status: DC
  Administered 2022-01-22 – 2022-01-29 (×26): 3 g via INTRAVENOUS
  Filled 2022-01-22 (×33): qty 8

## 2022-01-22 MED ORDER — DIPHENHYDRAMINE HCL 50 MG/ML IJ SOLN
25.0000 mg | Freq: Once | INTRAMUSCULAR | Status: AC
  Administered 2022-01-22: 25 mg via INTRAVENOUS
  Filled 2022-01-22: qty 1

## 2022-01-22 MED ORDER — SODIUM CHLORIDE 0.9 % IV SOLN: INTRAVENOUS | Status: DC

## 2022-01-22 MED ORDER — VANCOMYCIN HCL 1500 MG/300ML IV SOLN
1500.0000 mg | INTRAVENOUS | Status: DC
  Administered 2022-01-23: 1500 mg via INTRAVENOUS
  Filled 2022-01-22: qty 300

## 2022-01-22 MED ORDER — PROCHLORPERAZINE EDISYLATE 10 MG/2ML IJ SOLN: 10.0000 mg | Freq: Four times a day (QID) | INTRAMUSCULAR | Status: DC | PRN

## 2022-01-22 MED ORDER — VANCOMYCIN HCL 1250 MG/250ML IV SOLN
1250.0000 mg | Freq: Once | INTRAVENOUS | Status: AC
  Administered 2022-01-22: 1250 mg via INTRAVENOUS
  Filled 2022-01-22: qty 250

## 2022-01-22 MED ORDER — PANTOPRAZOLE SODIUM 40 MG PO TBEC
40.0000 mg | DELAYED_RELEASE_TABLET | Freq: Two times a day (BID) | ORAL | Status: DC
  Administered 2022-01-22 – 2022-01-29 (×14): 40 mg via ORAL
  Filled 2022-01-22 (×14): qty 1

## 2022-01-22 MED ORDER — DIPHENHYDRAMINE HCL 12.5 MG/5ML PO ELIX: 25.0000 mg | ORAL_SOLUTION | Freq: Once | ORAL | Status: DC

## 2022-01-22 MED ORDER — ONDANSETRON HCL 4 MG/2ML IJ SOLN
4.0000 mg | Freq: Once | INTRAMUSCULAR | Status: AC
  Administered 2022-01-22: 4 mg via INTRAVENOUS
  Filled 2022-01-22: qty 2

## 2022-01-22 MED ORDER — SODIUM CHLORIDE 0.9 % IV BOLUS: 1000.0000 mL | Freq: Once | INTRAVENOUS | Status: DC

## 2022-01-22 MED ORDER — HYDROMORPHONE HCL 1 MG/ML IJ SOLN
1.0000 mg | INTRAMUSCULAR | Status: DC | PRN
  Administered 2022-01-22 – 2022-01-24 (×6): 1 mg via INTRAVENOUS
  Filled 2022-01-22 (×6): qty 1

## 2022-01-22 NOTE — Progress Notes (Signed)
Pharmacy Antibiotic Note  Mallory Stewart is a 61 y.o. female admitted on 01/22/2022 with Sepsis secondary to cellulitis and draining pseudocyst.  Pharmacy has been consulted for Vancomycin and Unasyn dosing.  Allergies:  Clarified patients's listed Augmentin allergy.  Patient denies any known skin or otherwise reactions from Augmentin.  Allergy has been removed.   Added new Ceftriaxone allergy given bright red itchy rash that started after ceftriaxone was started, despite previously tolerating other cephalosporins.    Plan: Unasyn 3g IV q6h.  Vancomycin 1500 mg IV q24h (SCr 1, est AUC 538) Measure Vanc levels as needed.  Goal AUC = 400 - 550. Follow up renal function, culture results, and clinical course.      Temp (24hrs), Avg:99.1 F (37.3 C), Min:99.1 F (37.3 C), Max:99.1 F (37.3 C)  Recent Labs  Lab 01/17/22 0954 01/22/22 1217  WBC  --  15.7*  CREATININE 1.03 0.97  LATICACIDVEN  --  2.0*    Estimated Creatinine Clearance: 65.3 mL/min (by C-G formula based on SCr of 0.97 mg/dL).    Allergies  Allergen Reactions   Augmentin [Amoxicillin-Pot Clavulanate] Dermatitis   Gluten Meal Other (See Comments)    Stomach distress   Zocor [Simvastatin] Other (See Comments)    Elevated lfts?    Antimicrobials this admission: 9/18 Ceftriaxone x1  9/18 Vancomycin >>  9/18 Unasyn >>   Dose adjustments this admission:   Microbiology results:   Thank you for allowing pharmacy to be a part of this patient's care.  Gretta Arab PharmD, BCPS Clinical Pharmacist [facility] main pharmacy [phone] 01/22/2022 4:01 PM

## 2022-01-22 NOTE — H&P (Signed)
History and Physical    Patient: Mallory Stewart SKS:[id number] DOB: November 28, 1960 DOA: 01/22/2022 DOS: the patient was seen and examined on 01/22/2022 PCP: [name], MD  Patient coming from: Home  Chief Complaint:  Chief Complaint  Patient presents with   Wound Infection   HPI: Mallory Stewart is a 61 y.o. female with medical history significant of chronic pancreatitis w/ pseudocyst w/ extension into the retroperitoneum, DM2, cirrhosis, HTN. Presenting with abdominal distention and erythema of the left flank. She was recently in the hospital for essentially the same symptoms. She reports that she was doing well until last night. She noticed that she was having chills. She had some flank pain. She then noted that her back was draining. She didn't have any fevers, N/V. When her symptoms did not improve this morning, she decided to come to the ED for evaluation. She denies any other aggravating or alleviating factors.   Review of Systems: As mentioned in the history of present illness. All other systems reviewed and are negative. Past Medical History:  Diagnosis Date   Alcohol-induced chronic pancreatitis (HCC)    Alcoholic cirrhosis ([place])    B12 deficiency    Concussion    DDD (degenerative disc disease), cervical    Diabetes ([place])    DKA, type 2 ([place])    Gastric outlet obstruction 08/12/2018   Hypertension    Neuropathy    Pancreatic pseudocyst/cyst 05/06/2013   Seasonal allergies    Past Surgical History:  Procedure Laterality Date   ANKLE ARTHROSCOPY Right 12/20/2020   Procedure: RIGHT ANKLE ARTHROSCOPIC DEBRIDEMENT;  Surgeon: Newt Minion, MD;  Location: [place];  Service: Orthopedics;  Laterality: Right;   BIOPSY  01/19/2019   Procedure: BIOPSY;  Surgeon: Rush Landmark Telford Nab., MD;  Location: [place];  Service: Gastroenterology;;   CHOLECYSTECTOMY N/A 07/28/2016   Procedure: LAPAROSCOPIC CHOLECYSTECTOMY WITH INTRAOPERATIVE CHOLANGIOGRAM;   Surgeon: Mickeal Skinner, MD;  Location: [place];  Service: General;  Laterality: N/A;   ESOPHAGOGASTRODUODENOSCOPY (EGD) WITH PROPOFOL N/A 08/13/2018   Procedure: ESOPHAGOGASTRODUODENOSCOPY (EGD) WITH PROPOFOL;  Surgeon: Irving Copas., MD;  Location: [place];  Service: Gastroenterology;  Laterality: N/A;   ESOPHAGOGASTRODUODENOSCOPY (EGD) WITH PROPOFOL N/A 01/19/2019   Procedure: ESOPHAGOGASTRODUODENOSCOPY (EGD) WITH PROPOFOL;  Surgeon: Rush Landmark Telford Nab., MD;  Location: [place];  Service: Gastroenterology;  Laterality: N/A;   ESOPHAGOGASTRODUODENOSCOPY (EGD) WITH PROPOFOL N/A 12/28/2021   Procedure: ESOPHAGOGASTRODUODENOSCOPY (EGD) WITH PROPOFOL;  Surgeon: Rush Landmark Telford Nab., MD;  Location: [facility];  Service: Gastroenterology;  Laterality: N/A;   EUS N/A 12/28/2021   Procedure: UPPER ENDOSCOPIC ULTRASOUND (EUS) LINEAR;  Surgeon: Irving Copas., MD;  Location: [facility];  Service: Gastroenterology;  Laterality: N/A;   IR PARACENTESIS  08/08/2020   IR PARACENTESIS  03/13/2021   LEEP  1990's   ORIF FOOT FRACTURE  09/2008   L 5th metatarsal   REFRACTIVE SURGERY  2000   TIBIA IM NAIL INSERTION Left 03/12/2021   Procedure: INTRAMEDULLARY (IM) NAIL TIBIAL;  Surgeon: Jessy Oto, MD;  Location: [place];  Service: Orthopedics;  Laterality: Left;   TONSILLECTOMY     UPPER ESOPHAGEAL ENDOSCOPIC ULTRASOUND (EUS) N/A 08/13/2018   Procedure: UPPER ESOPHAGEAL ENDOSCOPIC ULTRASOUND (EUS);  Surgeon: Irving Copas., MD;  Location: [place];  Service: Gastroenterology;  Laterality: N/A;   UPPER ESOPHAGEAL ENDOSCOPIC ULTRASOUND (EUS) N/A 01/19/2019   Procedure: UPPER ESOPHAGEAL ENDOSCOPIC ULTRASOUND (EUS);  Surgeon: Irving Copas., MD;  Location: [place];  Service: Gastroenterology;  Laterality:  N/A;   Social History:  reports that she has never smoked. She has never used smokeless tobacco. She reports that she does not currently use alcohol after  a past usage of about 7.0 standard drinks of alcohol per week. She reports that she does not use drugs.  Allergies  Allergen Reactions   Augmentin [Amoxicillin-Pot Clavulanate] Dermatitis   Gluten Meal Other (See Comments)    Stomach distress   Zocor [Simvastatin] Other (See Comments)    Elevated lfts?    Family History  Problem Relation Age of Onset   Other Mother        tachycardia.Marland KitchenMarland Kitchen?afib   Stroke Father        after hernia suegery   Prostate cancer Father    Other Father        global transient amnesia, unclear source   Atrial fibrillation Sister    Healthy Brother    Healthy Brother    Coronary artery disease Paternal Grandmother    Heart attack Paternal Grandmother 64   Brain cancer Maternal Grandfather        ?   Cancer Paternal Grandfather        ?   Breast cancer Maternal Grandmother     Prior to Admission medications   Medication Sig Start Date End Date Taking? Authorizing Provider  aspirin EC 81 MG EC tablet Take 1 tablet (81 mg total) by mouth 2 (two) times daily. Swallow whole. 03/17/21  Yes Jessy Oto, MD  Calcium Carb-Cholecalciferol (CALCIUM+D3 PO) Take 1 tablet by mouth daily.   Yes [provider]  cetirizine (ZYRTEC) 10 MG tablet Take 10 mg by mouth at bedtime.   Yes [provider]  cholecalciferol (VITAMIN D3) 25 MCG (1000 UNIT) tablet Take 1,000 Units by mouth daily.   Yes [provider]  cyanocobalamin 2000 MCG tablet Take 2,000 mcg by mouth daily.   Yes [provider]  furosemide (LASIX) 20 MG tablet Take 1 tablet (20 mg total) by mouth every other day. Patient taking differently: Take 20 mg by mouth See admin instructions. Take 20 mg by mouth every other morning 01/02/22  Yes Vann, Jessica U, DO  Insulin Glargine (BASAGLAR KWIKPEN) 100 UNIT/ML Inject 36 Units into the skin daily. Patient taking differently: Inject 30 Units into the skin daily. 01/09/22  Yes Shamleffer, Melanie Crazier, MD  insulin lispro  (HUMALOG KWIKPEN) 100 UNIT/ML KwikPen Inject 6-10 Units into the skin 3 (three) times daily as needed (high blood sugar). Max Daily 30 units. Sliding Scale Patient taking differently: Inject 2-10 Units into the skin See admin instructions. Inject 6-10 units into the skin before meals as needed for elevated BGL, PER SLIDING SCALE 01/02/22  Yes Vann, Jessica U, DO  meclizine (ANTIVERT) 25 MG tablet Take 1/2 tablet po qhs prn vertigo Patient taking differently: Take 25 mg by mouth daily as needed (for vertigo). 11/23/21  Yes Dugal, Lawerance Bach, FNP  Melatonin 10 MG TABS Take 20 mg by mouth at bedtime.   Yes [provider]  pantoprazole (PROTONIX) 40 MG tablet TAKE 1 TABLET BY MOUTH TWICE DAILY BEFORE A MEAL Patient taking differently: Take 40 mg by mouth 2 (two) times daily. 10/19/21  Yes Gatha Mayer, MD  spironolactone (ALDACTONE) 25 MG tablet Take 1 tablet (25 mg total) by mouth daily. 01/11/22  Yes Danford, Suann Larry, MD  traMADol (ULTRAM) 50 MG tablet Take 1 tablet (50 mg total) by mouth every 6 (six) hours as needed for up to 5 days. 01/17/22  01/22/22 Yes Bedsole, Amy E, MD  Blood Glucose Monitoring Suppl (ONE TOUCH ULTRA 2) w/Device KIT Use to check blood sugars two times day 08/01/18   Bedsole, Amy E, MD  glucose blood (ONETOUCH ULTRA) test strip CHECK BLOOD SUGAR TWICE DAILY AS DIRECTED 12/13/21   Bedsole, Amy E, MD  Insulin Pen Needle 32G X 4 MM MISC 1 Device by Does not apply route in the morning, at noon, in the evening, and at bedtime. 11/20/21   Shamleffer, Melanie Crazier, MD  Lancets [facility] ULTRASOFT) lancets Use to check blood sugar two times a day. 08/01/18   [name], MD    Physical Exam: Vitals:   01/22/22 1052 01/22/22 1200 01/22/22 1230 01/22/22 1420  BP: (!) 132/95 133/78 (!) 140/83 125/83  Pulse: (!) 117 (!) 107 (!) 106 (!) 105  Resp: 16 (!) $Remo'28 17 12  'fvyQL$ Temp: 99.1 F (37.3 C)     TempSrc: Oral     SpO2: 98% 100% 100% 99%   General: 61 y.o. female resting in bed  in NAD Eyes: PERRL, normal sclera ENMT: Nares patent w/o discharge, orophaynx clear, dentition normal, ears w/o discharge/lesions/ulcers Neck: Supple, trachea midline Cardiovascular: tachy, +S1, S2, no m/g/r, equal pulses throughout Respiratory: CTABL, no w/r/r, normal WOB GI: BS+, distended but soft, TTP LLQ, left flank, erythema and drainage of left flank MSK: No e/c/c, macular erythema of BLE and BUE Neuro: A&O x 3, no focal deficits Psyc: Appropriate interaction and affect, calm/cooperative  Data Reviewed:  Results for orders placed or performed during the hospital encounter of 01/22/22 (from the past 24 hour(s))  Lactic acid, plasma     Status: Abnormal   Collection Time: 01/22/22 12:17 PM  Result Value Ref Range   Lactic Acid, Venous 2.0 (HH) 0.5 - 1.9 mmol/L  Comprehensive metabolic panel     Status: Abnormal   Collection Time: 01/22/22 12:17 PM  Result Value Ref Range   Sodium 126 (L) 135 - 145 mmol/L   Potassium 3.7 3.5 - 5.1 mmol/L   Chloride 93 (L) 98 - 111 mmol/L   CO2 25 22 - 32 mmol/L   Glucose, Bld 174 (H) 70 - 99 mg/dL   BUN 10 6 - 20 mg/dL   Creatinine, Ser 0.97 0.44 - 1.00 mg/dL   Calcium 8.4 (L) 8.9 - 10.3 mg/dL   Total Protein 6.6 6.5 - 8.1 g/dL   Albumin 2.5 (L) 3.5 - 5.0 g/dL   AST 20 15 - 41 U/L   ALT 9 0 - 44 U/L   Alkaline Phosphatase 116 38 - 126 U/L   Total Bilirubin 1.3 (H) 0.3 - 1.2 mg/dL   GFR, Estimated >60 >60 mL/min   Anion gap 8 5 - 15  CBC with Differential     Status: Abnormal   Collection Time: 01/22/22 12:17 PM  Result Value Ref Range   WBC 15.7 (H) 4.0 - 10.5 K/uL   RBC 3.50 (L) 3.87 - 5.11 MIL/uL   Hemoglobin 10.0 (L) 12.0 - 15.0 g/dL   HCT 30.7 (L) 36.0 - 46.0 %   MCV 87.7 80.0 - 100.0 fL   MCH 28.6 26.0 - 34.0 pg   MCHC 32.6 30.0 - 36.0 g/dL   RDW 13.2 11.5 - 15.5 %   Platelets 261 150 - 400 K/uL   nRBC 0.0 0.0 - 0.2 %   Neutrophils Relative % 87 %   Neutro Abs 13.6 (H) 1.7 - 7.7 K/uL   Lymphocytes Relative 5 %   Lymphs  Abs 0.8 0.7 - 4.0 K/uL   Monocytes Relative 7 %   Monocytes Absolute 1.1 (H) 0.1 - 1.0 K/uL   Eosinophils Relative 0 %   Eosinophils Absolute 0.0 0.0 - 0.5 K/uL   Basophils Relative 0 %   Basophils Absolute 0.0 0.0 - 0.1 K/uL   Immature Granulocytes 1 %   Abs Immature Granulocytes 0.11 (H) 0.00 - 0.07 K/uL  Protime-INR     Status: Abnormal   Collection Time: 01/22/22 12:17 PM  Result Value Ref Range   Prothrombin Time 17.8 (H) 11.4 - 15.2 seconds   INR 1.5 (H) 0.8 - 1.2  APTT     Status: None   Collection Time: 01/22/22 12:17 PM  Result Value Ref Range   aPTT 32 24 - 36 seconds  Brain natriuretic peptide     Status: None   Collection Time: 01/22/22 12:17 PM  Result Value Ref Range   B Natriuretic Peptide 57.8 0.0 - 100.0 pg/mL  Lipase, blood     Status: None   Collection Time: 01/22/22 12:17 PM  Result Value Ref Range   Lipase 18 11 - 51 U/L  TSH     Status: Abnormal   Collection Time: 01/22/22  1:39 PM  Result Value Ref Range   TSH 8.744 (H) 0.350 - 4.500 uIU/mL   CXR: Low lung volumes with right basilar horizontal linear likely subsegmental atelectasis. It is difficult to entirely exclude underlying pneumonia, however that is felt less likely.  CT ab/pelvis 1. Interval slight decrease in size of complex peripherally enhancing fluid collection extending from the left posterior pararenal space through the abdominal wall into the subcutaneous fat. This has been previously aspirated under ultrasound and is felt to reflect a pseudocyst. This may extend to the skin surface, and there appears to be increased surrounding subcutaneous edema which could reflect superimposed cellulitis. 2. No new or enlarging fluid collections are identified. There is a large amount of ascites which appears unchanged. 3. No acute abdominopelvic findings. 4. Hepatic cirrhosis with portal hypertension manifesting as multiple portosystemic collaterals.  Assessment and Plan: Sepsis secondary to  cellulitis and draining pseudocyst     - admit to inpt, progressive     - started on rocephin, vanc; but she is having a reaction to rocephin     - spoke with pharmacy, we are looking for alternatives given her history of c diff; ok to continue vanc for now  Allergic reaction     - appears she is having an allergic reaction to rocephin     - decadron and benadryl given; add pepcid; monitor  Ascites Hx of alcoholic cirrhosis     - paracentesis     - send fluid studies  DM2     - SSI, glucose checks     - DM diet after paracentesis  Chronic hyponatremia     - continue fluids, monitor  HTN     - holding lasix and spironolactone as we are giving her fluids for sepsis     - PRN metoprolol  Advance Care Planning:   Code Status: FULL  Consults: [facility]I  Family Communication: w/ husband at bedside  Severity of Illness: The appropriate patient status for this patient is INPATIENT. Inpatient status is judged to be reasonable and necessary in order to provide the required intensity of service to ensure the patient's safety. The patient's presenting symptoms, physical exam findings, and initial radiographic and laboratory data in the context of their chronic comorbidities is felt to  place them at high risk for further clinical deterioration. Furthermore, it is not anticipated that the patient will be medically stable for discharge from the hospital within 2 midnights of admission.   * I certify that at the point of admission it is my clinical judgment that the patient will require inpatient hospital care spanning beyond 2 midnights from the point of admission due to high intensity of service, high risk for further deterioration and high frequency of surveillance required.*  Time spent in coordination of this H&P: 65 minutes  Author: Jonnie Finner, DO 01/22/2022 2:44 PM  For on call review [email or web address].

## 2022-01-22 NOTE — Progress Notes (Signed)
A consult was received from an ED physician for Vancomycin per pharmacy dosing.  The patient's profile has been reviewed for ht/wt/allergies/indication/available labs.   A one time order has been placed for Vancomycin 1250mg .  Further antibiotics/pharmacy consults should be ordered by admitting physician if indicated.                       Thank you,  Gretta Arab PharmD, BCPS Clinical Pharmacist [facility] main pharmacy [phone] 01/22/2022 1:40 PM

## 2022-01-22 NOTE — ED Triage Notes (Signed)
Pt reports cyst to left lower back with redness, swelling, drainage x1 day with chills.  Pt reports finished abt 2 weeks ago after hospital admission.

## 2022-01-22 NOTE — ED Provider Notes (Signed)
[facility] DEPT Provider Note   CSN: [id number] Arrival date & time: 01/22/22  1043     History  Chief Complaint  Patient presents with   Wound Infection    TAELAR GRONEWOLD is a 61 y.o. female.  Patient as above with significant medical history as below, including alcoholic pancreatitis, cirrhosis, DM, HTN, pancreatic pseudocyst who presents to the ED with complaint of flank wound.  She was admitted 9/6 with similar complaint, she underwent paracentesis for ascites with appropriate drainage with IR, 5.9 L of yellow fluid.  Patient not surgical candidate per general surgery that time secondary to cirrhosis and portal hypertension, she was discharged on oral antibiotics.  He was advised to follow-up with tertiary center for further management of her pancreatic pseudocyst.  She has appointment tomorrow with Dr. Celesta Aver GI office.  Per notes from Dr. Arelia Longest unsure of any remedies to resolve the pseudocyst.  Patient does have ongoing pain to her left flank since her recent discharge, over the past 24-48 hrs. been having chills, worsening pain and drainage from her pseudocyst, no nausea or vomiting but has had poor appetite last 24 hours.  Symptoms not improved with tramadol.  Difficulty lying flat secondary to discomfort to her left flank from flank wound.  No fevers.  No change in bowel or bladder function.  No dyspnea.  No significant anterior abdominal pain.  Pain is primarily to the left flank.     Past Medical History:  Diagnosis Date   Alcohol-induced chronic pancreatitis (HCC)    Alcoholic cirrhosis ([place])    B12 deficiency    Concussion    DDD (degenerative disc disease), cervical    Diabetes ([place])    DKA, type 2 ([place])    Gastric outlet obstruction 08/12/2018   Hypertension    Neuropathy    Pancreatic pseudocyst/cyst 05/06/2013   Seasonal allergies     Past Surgical History:  Procedure Laterality Date   ANKLE ARTHROSCOPY Right 12/20/2020    Procedure: RIGHT ANKLE ARTHROSCOPIC DEBRIDEMENT;  Surgeon: Newt Minion, MD;  Location: [facility];  Service: Orthopedics;  Laterality: Right;   BIOPSY  01/19/2019   Procedure: BIOPSY;  Surgeon: Rush Landmark Telford Nab., MD;  Location: [facility];  Service: Gastroenterology;;   CHOLECYSTECTOMY N/A 07/28/2016   Procedure: LAPAROSCOPIC CHOLECYSTECTOMY WITH INTRAOPERATIVE CHOLANGIOGRAM;  Surgeon: Mickeal Skinner, MD;  Location: [facility];  Service: General;  Laterality: N/A;   ESOPHAGOGASTRODUODENOSCOPY (EGD) WITH PROPOFOL N/A 08/13/2018   Procedure: ESOPHAGOGASTRODUODENOSCOPY (EGD) WITH PROPOFOL;  Surgeon: Irving Copas., MD;  Location: [place] [facility];  Service: Gastroenterology;  Laterality: N/A;   ESOPHAGOGASTRODUODENOSCOPY (EGD) WITH PROPOFOL N/A 01/19/2019   Procedure: ESOPHAGOGASTRODUODENOSCOPY (EGD) WITH PROPOFOL;  Surgeon: Rush Landmark Telford Nab., MD;  Location: [facility];  Service: Gastroenterology;  Laterality: N/A;   ESOPHAGOGASTRODUODENOSCOPY (EGD) WITH PROPOFOL N/A 12/28/2021   Procedure: ESOPHAGOGASTRODUODENOSCOPY (EGD) WITH PROPOFOL;  Surgeon: Rush Landmark Telford Nab., MD;  [facility];  Service: Gastroenterology;  Laterality: N/A;   EUS N/A 12/28/2021   Procedure: UPPER ENDOSCOPIC ULTRASOUND (EUS) LINEAR;  Surgeon: Irving Copas., MD;  Location: [facility];  Service: Gastroenterology;  Laterality: N/A;   IR PARACENTESIS  08/08/2020   IR PARACENTESIS  03/13/2021   LEEP  1990's   ORIF FOOT FRACTURE  09/2008   L 5th metatarsal   REFRACTIVE SURGERY  2000   TIBIA IM NAIL INSERTION Left 03/12/2021   Procedure: INTRAMEDULLARY (IM) NAIL TIBIAL;  Surgeon: Jessy Oto, MD;  Location: [facility];  Service:  Orthopedics;  Laterality: Left;   TONSILLECTOMY     UPPER ESOPHAGEAL ENDOSCOPIC ULTRASOUND (EUS) N/A 08/13/2018   Procedure: UPPER ESOPHAGEAL ENDOSCOPIC ULTRASOUND (EUS);  Surgeon: Irving Copas., MD;  Location: [facility];  Service:  Gastroenterology;  Laterality: N/A;   UPPER ESOPHAGEAL ENDOSCOPIC ULTRASOUND (EUS) N/A 01/19/2019   Procedure: UPPER ESOPHAGEAL ENDOSCOPIC ULTRASOUND (EUS);  Surgeon: Irving Copas., MD;  Location: [facility];  Service: Gastroenterology;  Laterality: N/A;     The history is provided by the patient and the spouse. No language interpreter was used.       Home Medications Prior to Admission medications   Medication Sig Start Date End Date Taking? Authorizing Provider  aspirin EC 81 MG EC tablet Take 1 tablet (81 mg total) by mouth 2 (two) times daily. Swallow whole. 03/17/21  Yes Jessy Oto, MD  Calcium Carb-Cholecalciferol (CALCIUM+D3 PO) Take 1 tablet by mouth daily.   Yes [provider]  cetirizine (ZYRTEC) 10 MG tablet Take 10 mg by mouth at bedtime.   Yes [provider]  cholecalciferol (VITAMIN D3) 25 MCG (1000 UNIT) tablet Take 1,000 Units by mouth daily.   Yes [provider]  cyanocobalamin 2000 MCG tablet Take 2,000 mcg by mouth daily.   Yes [provider]  furosemide (LASIX) 20 MG tablet Take 1 tablet (20 mg total) by mouth every other day. Patient taking differently: Take 20 mg by mouth See admin instructions. Take 20 mg by mouth every other morning 01/02/22  Yes Vann, Jessica U, DO  Insulin Glargine (BASAGLAR KWIKPEN) 100 UNIT/ML Inject 36 Units into the skin daily. Patient taking differently: Inject 30 Units into the skin daily. 01/09/22  Yes Shamleffer, Melanie Crazier, MD  insulin lispro (HUMALOG KWIKPEN) 100 UNIT/ML KwikPen Inject 6-10 Units into the skin 3 (three) times daily as needed (high blood sugar). Max Daily 30 units. Sliding Scale Patient taking differently: Inject 2-10 Units into the skin See admin instructions. Inject 6-10 units into the skin before meals as needed for elevated BGL, PER SLIDING SCALE 01/02/22  Yes Vann, Jessica U, DO  meclizine (ANTIVERT) 25 MG tablet Take 1/2 tablet po qhs prn vertigo Patient taking  differently: Take 25 mg by mouth daily as needed (for vertigo). 11/23/21  Yes Dugal, Lawerance Bach, FNP  Melatonin 10 MG TABS Take 20 mg by mouth at bedtime.   Yes [provider]  pantoprazole (PROTONIX) 40 MG tablet TAKE 1 TABLET BY MOUTH TWICE DAILY BEFORE A MEAL Patient taking differently: Take 40 mg by mouth 2 (two) times daily. 10/19/21  Yes Gatha Mayer, MD  spironolactone (ALDACTONE) 25 MG tablet Take 1 tablet (25 mg total) by mouth daily. 01/11/22  Yes Danford, Suann Larry, MD  traMADol (ULTRAM) 50 MG tablet Take 1 tablet (50 mg total) by mouth every 6 (six) hours as needed for up to 5 days. 01/17/22 01/22/22 Yes Bedsole, Amy E, MD  Blood Glucose Monitoring Suppl (ONE TOUCH ULTRA 2) w/Device KIT Use to check blood sugars two times day 08/01/18   Bedsole, Amy E, MD  glucose blood (ONETOUCH ULTRA) test strip CHECK BLOOD SUGAR TWICE DAILY AS DIRECTED 12/13/21   Bedsole, Amy E, MD  Insulin Pen Needle 32G X 4 MM MISC 1 Device by Does not apply route in the morning, at noon, in the evening, and at bedtime. 11/20/21   Shamleffer, Melanie Crazier, MD  Lancets [facility] ULTRASOFT) lancets Use to check blood sugar two times a day. 08/01/18   [name], MD  Allergies    Ceftriaxone, Gluten meal, and Zocor [simvastatin]    Review of Systems   Review of Systems  Constitutional:  Positive for appetite change, chills and fatigue. Negative for activity change and fever.  HENT:  Negative for facial swelling and trouble swallowing.   Eyes:  Negative for discharge and redness.  Respiratory:  Negative for cough and shortness of breath.   Cardiovascular:  Negative for chest pain and palpitations.  Gastrointestinal:  Negative for abdominal pain and nausea.  Genitourinary:  Positive for flank pain. Negative for dysuria.  Musculoskeletal:  Negative for back pain and gait problem.  Skin:  Positive for wound. Negative for pallor and rash.  Neurological:  Negative for syncope and headaches.     Physical Exam Updated Vital Signs BP 124/85 (BP Location: Left Arm)   Pulse (!) 116   Temp 98.4 F (36.9 C) (Oral)   Resp 15   Ht $R'5\' 9"'Ey$  (1.753 m)   Wt 70.4 kg   SpO2 99%   BMI 22.92 kg/m  Physical Exam Vitals and nursing note reviewed.  Constitutional:      General: She is not in acute distress.    Appearance: Normal appearance.     Comments: Patient sitting edge of bed, unable lay flat secondary to reported left flank pain  HENT:     Head: Normocephalic and atraumatic.     Right Ear: External ear normal.     Left Ear: External ear normal.     Nose: Nose normal.     Mouth/Throat:     Mouth: Mucous membranes are moist.  Eyes:     General: No scleral icterus.       Right eye: No discharge.        Left eye: No discharge.  Cardiovascular:     Rate and Rhythm: Normal rate and regular rhythm.     Pulses: Normal pulses.     Heart sounds: Normal heart sounds.  Pulmonary:     Effort: Pulmonary effort is normal. No respiratory distress.     Breath sounds: Normal breath sounds.  Abdominal:     General: Abdomen is flat. There is distension.     Palpations: Abdomen is soft.     Tenderness: There is no abdominal tenderness. There is no guarding or rebound.     Comments: Minimally distended abdomen  Musculoskeletal:        General: Normal range of motion.     Cervical back: Normal range of motion.     Right lower leg: No edema.     Left lower leg: No edema.  Skin:    General: Skin is warm and dry.     Capillary Refill: Capillary refill takes less than 2 seconds.       Neurological:     Mental Status: She is alert and oriented to person, place, and time.     GCS: GCS eye subscore is 4. GCS verbal subscore is 5. GCS motor subscore is 6.  Psychiatric:        Mood and Affect: Mood normal.        Behavior: Behavior normal.      ED Results / Procedures / Treatments   Labs (all labs ordered are listed, but only abnormal results are displayed) Labs Reviewed  LACTIC  ACID, PLASMA - Abnormal; Notable for the following components:      Result Value   Lactic Acid, Venous 2.0 (*)    All other components within normal limits  LACTIC ACID,  PLASMA - Abnormal; Notable for the following components:   Lactic Acid, Venous 2.0 (*)    All other components within normal limits  COMPREHENSIVE METABOLIC PANEL - Abnormal; Notable for the following components:   Sodium 126 (*)    Chloride 93 (*)    Glucose, Bld 174 (*)    Calcium 8.4 (*)    Albumin 2.5 (*)    Total Bilirubin 1.3 (*)    All other components within normal limits  CBC WITH DIFFERENTIAL/PLATELET - Abnormal; Notable for the following components:   WBC 15.7 (*)    RBC 3.50 (*)    Hemoglobin 10.0 (*)    HCT 30.7 (*)    Neutro Abs 13.6 (*)    Monocytes Absolute 1.1 (*)    Abs Immature Granulocytes 0.11 (*)    All other components within normal limits  PROTIME-INR - Abnormal; Notable for the following components:   Prothrombin Time 17.8 (*)    INR 1.5 (*)    All other components within normal limits  URINALYSIS, ROUTINE W REFLEX MICROSCOPIC - Abnormal; Notable for the following components:   Hgb urine dipstick SMALL (*)    Bacteria, UA RARE (*)    All other components within normal limits  OSMOLALITY - Abnormal; Notable for the following components:   Osmolality 266 (*)    All other components within normal limits  OSMOLALITY, URINE - Abnormal; Notable for the following components:   Osmolality, Ur 264 (*)    All other components within normal limits  TSH - Abnormal; Notable for the following components:   TSH 8.744 (*)    All other components within normal limits  BASIC METABOLIC PANEL - Abnormal; Notable for the following components:   Sodium 128 (*)    Chloride 94 (*)    Glucose, Bld 110 (*)    Calcium 8.5 (*)    All other components within normal limits  CULTURE, BLOOD (ROUTINE X 2)  CULTURE, BLOOD (ROUTINE X 2)  APTT  BRAIN NATRIURETIC PEPTIDE  LIPASE, BLOOD  NA AND K (SODIUM &  POTASSIUM), RAND UR  GLUCOSE, CAPILLARY  UREA NITROGEN, URINE  PROTIME-INR  CORTISOL-AM, BLOOD  PROCALCITONIN  LACTIC ACID, PLASMA  LACTIC ACID, PLASMA  COMPREHENSIVE METABOLIC PANEL  CBC  CBG MONITORING, ED    EKG None  Radiology CT ABDOMEN PELVIS W CONTRAST  Result Date: 01/22/2022 CLINICAL DATA:  Abdominal pain, acute, nonlocalized. Left flank pain. History of pancreatic pseudocyst. EXAM: CT ABDOMEN AND PELVIS WITH CONTRAST TECHNIQUE: Multidetector CT imaging of the abdomen and pelvis was performed using the standard protocol following bolus administration of intravenous contrast. RADIATION DOSE REDUCTION: This exam was performed according to the departmental dose-optimization program which includes automated exposure control, adjustment of the mA and/or kV according to patient size and/or use of iterative reconstruction technique. CONTRAST:  147mL OMNIPAQUE IOHEXOL 300 MG/ML  SOLN COMPARISON:  Abdominopelvic CT 01/10/2022 and 01/01/2022. FINDINGS: Lower chest: Similar atelectasis or scarring at both lung bases. No significant pleural or pericardial effusion. There is aortic and coronary artery atherosclerosis. Hepatobiliary: Contour irregularity of the liver and relative enlargement of the caudate and left lobes again noted, suspicious for cirrhosis. No focal liver lesions are identified. The gallbladder is surgically absent. There are chronic small calcified stones within the cystic duct remnant. Stable mild extrahepatic biliary dilatation without definite signs of choledocholithiasis. No intrahepatic biliary dilatation. Pancreas: Chronic pancreatic atrophy without evidence of acute inflammation or pancreatic ductal dilatation. Peripancreatic collection adjacent to the pancreatic tail shows further  decrease in size, now measuring 1.6 cm on image 33/2 (previously 2.2 cm). Spleen: Spleen size is stable at the upper limits of normal. Adrenals/Urinary Tract: Both adrenal glands appear normal. No  evidence of urinary tract calculus, suspicious renal lesion or hydronephrosis. The bladder appears unremarkable for its degree of distention. Stomach/Bowel: No enteric contrast administered. The stomach appears unremarkable for its degree of distension. No evidence of bowel wall thickening, distention or surrounding inflammatory change. Scattered colonic diverticulosis. Vascular/Lymphatic: There are no enlarged abdominal or pelvic lymph nodes. No acute vascular findings are identified. Mild aortic and branch vessel atherosclerosis without aneurysm. There are multiple chronic portosystemic venous collaterals in the left upper quadrant of the abdomen. Reproductive: Small left fundal uterine fibroid.  No adnexal mass. Other: A large amount of ascites is again noted which is similar in volume to the most recent prior studies. A large peripherally enhancing complex fluid collection is again noted within the left posterior pararenal space, with posterior extension through the posterior left abdominal wall. This has irregular margins and is difficult to measure, but shows further decrease in size compared with the most recent study. It currently measures approximately 7.5 x 3.9 cm transverse on image 44/2 (previously 7.3 x 5.5 cm). On the coronal images, it measures up to 15.2 cm on image 110/4 (previously 14.8 cm). This may extend to the skin surface, and there is increased surrounding inflammation in the subcutaneous fat without soft tissue emphysema. No new or enlarging fluid collections are identified. Musculoskeletal: No acute or significant osseous findings. Mild spondylosis. No osseous destruction identified. IMPRESSION: 1. Interval slight decrease in size of complex peripherally enhancing fluid collection extending from the left posterior pararenal space through the abdominal wall into the subcutaneous fat. This has been previously aspirated under ultrasound and is felt to reflect a pseudocyst. This may extend to  the skin surface, and there appears to be increased surrounding subcutaneous edema which could reflect superimposed cellulitis. 2. No new or enlarging fluid collections are identified. There is a large amount of ascites which appears unchanged. 3. No acute abdominopelvic findings. 4. Hepatic cirrhosis with portal hypertension manifesting as multiple portosystemic collaterals. Electronically Signed   By: Richardean Sale M.D.   On: 01/22/2022 13:23   DG Chest Port 1 View  Result Date: 01/22/2022 CLINICAL DATA:  Questionable sepsis.  Evaluate for abnormality. EXAM: PORTABLE CHEST 1 VIEW COMPARISON:  AP chest 08/07/2021 FINDINGS: Cardiac silhouette and mediastinal contours within normal limits. Moderate right mild left decreased lung volumes, mildly worsened from prior. Right basilar horizontal linear likely subsegmental atelectasis. No definite pleural effusion. No pneumothorax is seen. No acute skeletal abnormality. IMPRESSION: Low lung volumes with right basilar horizontal linear likely subsegmental atelectasis. It is difficult to entirely exclude underlying pneumonia, however that is felt less likely. Electronically Signed   By: Yvonne Kendall M.D.   On: 01/22/2022 12:54    Procedures .Critical Care  Performed by: Jeanell Sparrow, DO Authorized by: Jeanell Sparrow, DO   Critical care provider statement:    Critical care time (minutes):  30   Critical care time was exclusive of:  Separately billable procedures and treating other patients   Critical care was necessary to treat or prevent imminent or life-threatening deterioration of the following conditions:  Sepsis and endocrine crisis   Critical care was time spent personally by me on the following activities:  Development of treatment plan with patient or surrogate, discussions with consultants, evaluation of patient's response to treatment, examination of patient,  ordering and review of laboratory studies, ordering and review of radiographic studies,  ordering and performing treatments and interventions, pulse oximetry, re-evaluation of patient's condition, review of old charts and obtaining history from patient or surrogate   Care discussed with: admitting provider       Medications Ordered in ED Medications  famotidine (PEPCID) IVPB 20 mg premix (has no administration in time range)  diphenhydrAMINE (BENADRYL) injection 25 mg (has no administration in time range)  aspirin EC tablet 81 mg (has no administration in time range)  pantoprazole (PROTONIX) EC tablet 40 mg (has no administration in time range)  0.9 %  sodium chloride infusion ( Intravenous New Bag/Given 01/22/22 1730)  prochlorperazine (COMPAZINE) injection 10 mg (has no administration in time range)  vancomycin (VANCOREADY) IVPB 1500 mg/300 mL (has no administration in time range)  Ampicillin-Sulbactam (UNASYN) 3 g in sodium chloride 0.9 % 100 mL IVPB (has no administration in time range)  HYDROmorphone (DILAUDID) injection 1 mg (has no administration in time range)  HYDROmorphone (DILAUDID) injection 1 mg (1 mg Intravenous Given 01/22/22 1224)  ondansetron (ZOFRAN) injection 4 mg (4 mg Intravenous Given 01/22/22 1224)  lactated ringers bolus 1,000 mL (1,000 mLs Intravenous New Bag/Given 01/22/22 1225)  iohexol (OMNIPAQUE) 300 MG/ML solution 100 mL (100 mLs Intravenous Contrast Given 01/22/22 1248)  cefTRIAXone (ROCEPHIN) 2 g in sodium chloride 0.9 % 100 mL IVPB (0 g Intravenous Stopped 01/22/22 1430)  vancomycin (VANCOREADY) IVPB 1250 mg/250 mL (1,250 mg Intravenous New Bag/Given 01/22/22 1556)  HYDROmorphone (DILAUDID) injection 0.5 mg (0.5 mg Intravenous Given 01/22/22 1410)  dexamethasone (DECADRON) injection 10 mg (10 mg Intravenous Given 01/22/22 1451)  diphenhydrAMINE (BENADRYL) injection 25 mg (25 mg Intravenous Given 01/22/22 1449)    ED Course/ Medical Decision Making/ A&P Clinical Course as of 01/22/22 1751  Mon Jan 22, 2022  1339 Sodium(!): 126 5 days ago sodium was  132, creatinine 0.97 today [SG]  1340 WBC(!): 15.7 [SG]  1340 Pulse Rate(!): 117 [SG]  1340 Temp: 99.1 F (37.3 C) [SG]  1340 Resp(!): 28 Sepsis likely due to cellulitis/abscess on her back, pseudocyst [SG]    Clinical Course User Index [SG] Jeanell Sparrow, DO                           Medical Decision Making Amount and/or Complexity of Data Reviewed Labs: ordered. Decision-making details documented in ED Course. Radiology: ordered. ECG/medicine tests: ordered.  Risk Prescription drug management. Decision regarding hospitalization.   This patient presents to the ED with chief complaint(s) of left-sided flank pain, wound with pertinent past medical history of colic cirrhosis, portal hypertension, pancreatic pseudocyst which further complicates the presenting complaint. The complaint involves an extensive differential diagnosis and also carries with it a high risk of complications and morbidity.     Differential diagnosis includes but is not exclusive to ectopic pregnancy, ovarian cyst, ovarian torsion, acute appendicitis, urinary tract infection, endometriosis, bowel obstruction, hernia, colitis, renal colic, pancreatitis, pseudocyst, infectious, abscess, soft tissue infection gastroenteritis, volvulus etc.   . Serious etiologies were considered.   The initial plan is to screening labs, rpt ct, ivf, dilaudid/zofran   Additional history obtained: Additional history obtained from spouse Records reviewed previous admission documents and Primary Care Documents home meds, prior labs/imaging   Independent labs interpretation:  The following labs were independently interpreted:  WBC 15.7, she is tachycardic, tachypneic, low-grade fever, cellulitis/abscess to her left back flank.  Meets criteria for sepsis.  Start  IV fluids, Rocephin and Vanco ordered  Sodium 126, patient is on Lasix and spironolactone, favor this is likely result from diuresis.  Send osmolality and urine lites,  recommend judicious IV fluid resus, recheck bmp in 3 hrs  Independent visualization of imaging: - I independently visualized the following imaging with scope of interpretation limited to determining acute life threatening conditions related to emergency care: CTAP, CXR, which revealed x-ray stable, CT with large amount of ascites, likely cirrhosis, soft tissue edema/cellulitis to flank/back  Cardiac monitoring was reviewed and interpreted by myself which shows tachycardia  Treatment and Reassessment: Dilaudid Zofran Ivf vancomycin, ceftriaxone >> pain improved, hr improved  Consultation: - Consulted or discussed management/test interpretation w/ external professional: na  Consideration for admission or further workup: Admission was considered   61 year old female with alcoholic cirrhosis, ascites to the ED with worsening left-sided flank pain, pseudocyst.  Purulent drainage from pseudocyst.  Work-up today concerning for sepsis likely due to cellulitis from the pseudocyst.  Start vancomycin and Rocephin.  She is also hyponatremic, likely due to her diuretics. Rehydration. Check urine lytes/osm.  Recommend admission for the above.  Patient is agreeable.   >> pt did experience hives/rash after rocephin. Will stop infusion, give benadryl/decadron. No airway compromise. Will defer to pharmacy for next line abx in this case.   Social Determinants of health: Social History   Tobacco Use   Smoking status: Never   Smokeless tobacco: Never  Vaping Use   Vaping Use: Never used  Substance Use Topics   Alcohol use: Not Currently    Alcohol/week: 7.0 standard drinks of alcohol    Types: 7 Glasses of wine per week    Comment: wine   Drug use: No            Final Clinical Impression(s) / ED Diagnoses Final diagnoses:  Cellulitis of back except buttock  Hyponatremia  Sepsis, due to unspecified organism, unspecified whether acute organ dysfunction present [facility])    Rx / DC  Orders ED Discharge Orders     None         Jeanell Sparrow, DO 01/22/22 1751

## 2022-01-22 NOTE — Plan of Care (Signed)
  Problem: Education: Goal: Knowledge of General Education information will improve Description: Including pain rating scale, medication(s)/side effects and non-pharmacologic comfort measures Outcome: Progressing   Problem: Activity: Goal: Risk for activity intolerance will decrease Outcome: Progressing   Problem: Coping: Goal: Level of anxiety will decrease Outcome: Progressing   Problem: Pain Managment: Goal: General experience of comfort will improve Outcome: Progressing   Problem: Safety: Goal: Ability to remain free from injury will improve Outcome: Progressing   Problem: Skin Integrity: Goal: Risk for impaired skin integrity will decrease Outcome: Progressing   Problem: Respiratory: Goal: Ability to maintain adequate ventilation will improve Outcome: Progressing

## 2022-01-22 NOTE — Consult Note (Signed)
Consultation Note   Referring Provider: [facility] PCP: [name], MD Primary Gastroenterologist: Silvano Rusk, MD Reason for consultation: pancreatic pseudocyst  Hospital Day: 1  Assessment / Plan   # 61 yo female with Etoh cirrhosis with portal HTN. MELD 20. Large amount of ascites on CT scan. Her diuretics were reduced several weeks ago due to hyponatremia. Two diagnostic taps last admissions didn't meet SBP criteria.   # Chronic etoh pancreatitis recently complicated by infected pseudocyst involving left retroperitoneal space. She is s/p drainage by IR on 8/25 ( 330 ml) with culture positive for Klebsiella. Completed course of antibiotics.   # Sepsis. In ED with worsening pain and swelling and left lower back and new drainage from the area. CT scan today suggests cellulitis of the area but a slight decrease in the fluid collection  Dr. Rush Landmark will see this afternoon. May need MRI/ MRCP for further evaluation.  Got dose of Rocephin but had a cutaneous reaction so changed to Unasyn. Also getting IV Vanco.  # Chronic hyponatremia, stable  # Chronic L[place]all anemia. Hgb stable at 10  See PMH for additional medical problems   HPI   Mallory Stewart is a 61 y.o. female with a past medical history significant for Etoh cirrhosis with portal HTN, chronic Etoh pancreatitis complicated by infected pseudocyst, C-diff   See PMH for any additional medical problems.  Ladawna was hospitalized several days in late August with SIRS, left lower back pain.  CT scan >>  enlarging, now retroperitoneal communicating pseudocyst nearly 13 cm.   She was febrile with elevated WBC. We were concerned about an infected pseudocyst.but need to rule out SBP .A diagnostic paracentesis was done. Fluid didn't appear infected. We repeated a tap and again, no SBP. She underwent EUS by Dr. Rush Landmark on 8/24. Findings suggested benign inflammatory changes  consistent with pseudocyst or inflammatory phelgmon. Unfortunately, he was not able to  access this area safely. She underwent aspiration with IR on 6/25 and culture grew Klebsiella. She was discharged home on 8/29 with Cefadroxil, flagyl and vancomycin ( to prevent recurrent C-diff).    01/10/22 readmission [place] was readmitted overnight 9/7 for worsening abdominal pain. CT scan showed stable pseudocyst. WBC was normal, she was afebrile  Patient presented to ED today for evaluation of worsening swelling of left lower back. She tells me that when she last left the hospital on 9/7 the swelling in her back was stable, about the size of an orange. Over the last couple of days the area was enlarged, become much more erythematous and tender and this started draining. She had chills a couple of nights ago. She hasn't had any abdominal pain.   The pain is left lower back is severe and makes her nauseated at times.    Previous GI Evaluation     12/28/21 EUS EUS Impression: - Pancreatic parenchymal abnormalities consisting of atrophy, hyperechoic foci, lobularity and hyperechoic strands were noted in the entire pancreas. Consistent with known chronic pancreatitis. - A cystic lesion was seen in the peripancreatic tail region. Tissue has not been obtained. However, the endosonographic appearance is suggestive of benign inflammatory changes consistent with pseudocyst or inflammatory phelgmon. Unfortunately, as noted above, not able to acess this  area safely due to the portal collaterals and splenule in the region with EUS guided aspiration. - Increased venous collaterals noted in the region of the splenic hilum. - Ascites was found on endosonographic examination of the peritoneal cavity. - A few enlarged lymph nodes were visualized in the peripancreatic region. Tissue has not been obtained. However, the endosonographic appearance is suggestive of benign inflammatory changes.    Recent Labs and  Imaging CT ABDOMEN PELVIS W CONTRAST  Result Date: 01/22/2022 CLINICAL DATA:  Abdominal pain, acute, nonlocalized. Left flank pain. History of pancreatic pseudocyst. EXAM: CT ABDOMEN AND PELVIS WITH CONTRAST TECHNIQUE: Multidetector CT imaging of the abdomen and pelvis was performed using the standard protocol following bolus administration of intravenous contrast. RADIATION DOSE REDUCTION: This exam was performed according to the departmental dose-optimization program which includes automated exposure control, adjustment of the mA and/or kV according to patient size and/or use of iterative reconstruction technique. CONTRAST:  150mL OMNIPAQUE IOHEXOL 300 MG/ML  SOLN COMPARISON:  Abdominopelvic CT 01/10/2022 and 01/01/2022. FINDINGS: Lower chest: Similar atelectasis or scarring at both lung bases. No significant pleural or pericardial effusion. There is aortic and coronary artery atherosclerosis. Hepatobiliary: Contour irregularity of the liver and relative enlargement of the caudate and left lobes again noted, suspicious for cirrhosis. No focal liver lesions are identified. The gallbladder is surgically absent. There are chronic small calcified stones within the cystic duct remnant. Stable mild extrahepatic biliary dilatation without definite signs of choledocholithiasis. No intrahepatic biliary dilatation. Pancreas: Chronic pancreatic atrophy without evidence of acute inflammation or pancreatic ductal dilatation. Peripancreatic collection adjacent to the pancreatic tail shows further decrease in size, now measuring 1.6 cm on image 33/2 (previously 2.2 cm). Spleen: Spleen size is stable at the upper limits of normal. Adrenals/Urinary Tract: Both adrenal glands appear normal. No evidence of urinary tract calculus, suspicious renal lesion or hydronephrosis. The bladder appears unremarkable for its degree of distention. Stomach/Bowel: No enteric contrast administered. The stomach appears unremarkable for its degree  of distension. No evidence of bowel wall thickening, distention or surrounding inflammatory change. Scattered colonic diverticulosis. Vascular/Lymphatic: There are no enlarged abdominal or pelvic lymph nodes. No acute vascular findings are identified. Mild aortic and branch vessel atherosclerosis without aneurysm. There are multiple chronic portosystemic venous collaterals in the left upper quadrant of the abdomen. Reproductive: Small left fundal uterine fibroid.  No adnexal mass. Other: A large amount of ascites is again noted which is similar in volume to the most recent prior studies. A large peripherally enhancing complex fluid collection is again noted within the left posterior pararenal space, with posterior extension through the posterior left abdominal wall. This has irregular margins and is difficult to measure, but shows further decrease in size compared with the most recent study. It currently measures approximately 7.5 x 3.9 cm transverse on image 44/2 (previously 7.3 x 5.5 cm). On the coronal images, it measures up to 15.2 cm on image 110/4 (previously 14.8 cm). This may extend to the skin surface, and there is increased surrounding inflammation in the subcutaneous fat without soft tissue emphysema. No new or enlarging fluid collections are identified. Musculoskeletal: No acute or significant osseous findings. Mild spondylosis. No osseous destruction identified. IMPRESSION: 1. Interval slight decrease in size of complex peripherally enhancing fluid collection extending from the left posterior pararenal space through the abdominal wall into the subcutaneous fat. This has been previously aspirated under ultrasound and is felt to reflect a pseudocyst. This may extend to the skin surface, and there appears to  be increased surrounding subcutaneous edema which could reflect superimposed cellulitis. 2. No new or enlarging fluid collections are identified. There is a large amount of ascites which appears  unchanged. 3. No acute abdominopelvic findings. 4. Hepatic cirrhosis with portal hypertension manifesting as multiple portosystemic collaterals. Electronically Signed   By: Richardean Sale M.D.   On: 01/22/2022 13:23   DG Chest Port 1 View  Result Date: 01/22/2022 CLINICAL DATA:  Questionable sepsis.  Evaluate for abnormality. EXAM: PORTABLE CHEST 1 VIEW COMPARISON:  AP chest 08/07/2021 FINDINGS: Cardiac silhouette and mediastinal contours within normal limits. Moderate right mild left decreased lung volumes, mildly worsened from prior. Right basilar horizontal linear likely subsegmental atelectasis. No definite pleural effusion. No pneumothorax is seen. No acute skeletal abnormality. IMPRESSION: Low lung volumes with right basilar horizontal linear likely subsegmental atelectasis. It is difficult to entirely exclude underlying pneumonia, however that is felt less likely. Electronically Signed   By: Yvonne Kendall M.D.   On: 01/22/2022 12:54   US Paracentesis  Result Date: 01/11/2022 INDICATION: Patient with history of pancreatic pseudocyst, chronic pancreatitis, alcoholic cirrhosis, recurrent ascites; request received for diagnostic and therapeutic paracentesis. EXAM: ULTRASOUND GUIDED DIAGNOSTIC AND THERAPEUTIC PARACENTESIS MEDICATIONS: 8 mL 1% lidocaine COMPLICATIONS: None immediate. PROCEDURE: Informed written consent was obtained from the patient after a discussion of the risks, benefits and alternatives to treatment. A timeout was performed prior to the initiation of the procedure. Initial ultrasound scanning demonstrates a large amount of ascites within the right lower abdominal quadrant. The right lower abdomen was prepped and draped in the usual sterile fashion. 1% lidocaine was used for local anesthesia. Following this, a 19 gauge, 7-cm, Yueh catheter was introduced. An ultrasound image was saved for documentation purposes. The paracentesis was performed. The catheter was removed and a dressing was  applied. The patient tolerated the procedure well without immediate post procedural complication. FINDINGS: A total of approximately 5.9 liters of yellow fluid was removed. Samples were sent to the laboratory as requested by the clinical team. IMPRESSION: Successful ultrasound-guided diagnostic and therapeutic paracentesis yielding 5.9 liters of peritoneal fluid. PLAN: The patient has required >/=2 paracenteses in a 30 day period and a screening evaluation by the [facility] has been arranged. Read by: Rowe Robert, PA-C Electronically Signed   By: Markus Daft M.D.   On: 01/11/2022 10:52   CT ABDOMEN PELVIS W CONTRAST  Result Date: 01/10/2022 CLINICAL DATA:  Evaluate abdominal distension. Pancreatic pseudocyst. EXAM: CT ABDOMEN AND PELVIS WITH CONTRAST TECHNIQUE: Multidetector CT imaging of the abdomen and pelvis was performed using the standard protocol following bolus administration of intravenous contrast. RADIATION DOSE REDUCTION: This exam was performed according to the departmental dose-optimization program which includes automated exposure control, adjustment of the mA and/or kV according to patient size and/or use of iterative reconstruction technique. CONTRAST:  163mL OMNIPAQUE IOHEXOL 300 MG/ML  SOLN COMPARISON:  01/01/2022 FINDINGS: Lower chest: Mild subsegmental atelectasis noted within the lung bases. Hepatobiliary: The liver appears cirrhotic with hypertrophy of the lateral segment of left hepatic lobe and caudate lobe. Contour the liver is slightly nodular. No focal liver lesion identified. Previous cholecystectomy. Again noted are several tiny stones identified within the cystic duct remnant. Unchanged common bile duct dilatation which measures 1 cm in diameter. Pancreas: Changes of chronic pancreatitis with punctate calcifications within the head of pancreas are again noted. No acute inflammatory changes identified within the pancreas. No  pancreatic mass. Peripherally enhancing fluid collection associated with the tail of pancreas  is again noted measuring 2.2 x 1.7 cm, image 36/2. This is compared with 3.5 by 2.0 cm previously. Large peripherally enhancing, multilocular fluid collection is seen within the left posterior pararenal space and extending into the left posterior abdominal wall. On today's study this measures 7.3 by 5.5 by 14.8 cm, image 44/2 and image 110/5. Previously this measured 7.3 x 5.7 by 14.6 cm. Spleen: Spleen is within normal limits measuring 12.7 cm cranial caudal. Adrenals/Urinary Tract: Normal adrenal glands. No kidney mass, nephrolithiasis, or hydronephrosis. Urinary bladder is unremarkable. Stomach/Bowel: Stomach appears nondistended. There is mild increase caliber of the transverse portion of the duodenum which measures up to 3.8 cm. The remaining small bowel loops are normal in caliber. No pathologic dilatation of the colon to suggest obstruction. Vascular/Lymphatic: The portal vein, portal venous confluence and SMV are patent. 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 Gastroesophageal varices are again noted. No signs of abdominopelvic adenopathy. Aortic atherosclerosis without aneurysm. Reproductive: Uterus and bilateral adnexa are unremarkable. Other: There is a large volume of ascites within the abdomen and pelvis. When compared with the previous exam this is stable to mildly increased from the prior study. Musculoskeletal: No acute or significant osseous findings. IMPRESSION: 1. Similar size of peripherally enhancing large pseudocyst involving the left posterior pararenal space extending into the left posterior abdominal wall. Smaller pseudocyst within tail of pancreas is slightly decreased in size from previous exam. 2. Large volume of ascites within the abdomen and pelvis. When compared with the previous exam this is stable to mildly increased from the prior study. 3.  Cirrhosis with stigmata of portal venous hypertension. 4. Unchanged appearance of dilatation of the common bile duct which is likely secondary to stricture within the pancreatic head. 5. Changes of chronic pancreatitis identified. 6. 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 7. Aortic Atherosclerosis (ICD10-I70.0). Electronically Signed   By: Kerby Moors M.D.   On: 01/10/2022 13:34   CT ABDOMEN W WO CONTRAST  Result Date: 01/01/2022 CLINICAL DATA:  Worsening abdominal and back pain. Pancreatic pseudocysts and cirrhosis. EXAM: CT ABDOMEN WITHOUT AND WITH CONTRAST TECHNIQUE: Multidetector CT imaging of the abdomen was performed following the standard protocol before and following the bolus administration of intravenous contrast. RADIATION DOSE REDUCTION: This exam was performed according to the departmental dose-optimization program which includes automated exposure control, adjustment of the mA and/or kV according to patient size and/or use of iterative reconstruction technique. CONTRAST:  167mL OMNIPAQUE IOHEXOL 300 MG/ML  SOLN COMPARISON:  12/29/2021 FINDINGS: Lower chest: No acute findings. Hepatobiliary: Hepatic cirrhosis is again demonstrated. No evidence of hepatic mass. Recanalization of paraumbilical veins is consistent with portal venous hypertension. Prior cholecystectomy noted. A few tiny less than 5 mm calculi are again seen in the cystic duct remnant. Stable dilatation of the common bile duct measuring 1.6 cm, with probable stricture of the distal common bile duct within the pancreatic head. Pancreas: Scattered tiny punctate calcifications are seen in the pancreatic head, suspicious for chronic pancreatitis. No evidence acute peripancreatic inflammatory changes. No evidence of pancreatic mass. Rim enhancing fluid collection is again seen involving the pancreatic tail measuring 3.5 x 2.0 cm, consistent with a small pseudocyst. A large multilocular rim enhancing  fluid collection is again seen in the left posterior pararenal space. This measures 14 x 7 by 7.3 x 5.7 cm, without significant change, consistent with pancreatic pseudocysts. Moderate abdominal ascites shows no significant change. Spleen:  Within normal limits in size  and appearance. Adrenals/Urinary Tract: No suspicious masses identified. No evidence of hydronephrosis. Stomach/Bowel: Unremarkable. Vascular/Lymphatic: No pathologically enlarged lymph nodes identified. No acute vascular findings. Chronic thrombosis of the proximal vein is seen with reconstitution of the distal splenic vein. Mild gastroesophageal varices are again seen, consistent with portal venous hypertension. Other:  None. Musculoskeletal:  No suspicious bone lesions identified. IMPRESSION: Stable pancreatic pseudocysts involving the pancreatic tail and in the left posterior pararenal space. Moderate ascites, without significant change. Stable hepatic cirrhosis and findings of portal venous hypertension. No evidence of hepatic neoplasm. Stable dilatation of common bile duct, with probable distal stricture within the pancreatic head. Chronic pancreatitis is suspected, and no pancreatic mass identified. Recommend correlation with liver function tests, and consider abdomen MRI and MRCP without and with contrast for further evaluation if clinically warranted. Electronically Signed   By: Marlaine Hind M.D.   On: 01/01/2022 15:27   [place] FINE NEEDLE ASP 1ST LESION  Result Date: 12/29/2021 INDICATION: Left flank pain. Enlarging left upper quadrant retroperitoneal pancreatic pseudocyst, possibly involving body wall EXAM: ULTRASOUND ASPIRATION MEDICATIONS: No periprocedural antibiotics were indicated ANESTHESIA/SEDATION: Lidocaine 1% subcutaneous COMPLICATIONS: None immediate. PROCEDURE: Informed written consent was obtained from the patient after a thorough discussion of the procedural risks, benefits and alternatives. All questions were addressed.  Maximal Sterile Barrier Technique was utilized including caps, mask, sterile gowns, sterile gloves, sterile drape, hand hygiene and skin antiseptic. A timeout was performed prior to the initiation of the procedure. Ultrasound of the left upper quadrant retroperitoneum was performed and the complex collection was localized corresponding to CT findings. An appropriate skin entry site was determined and marked. The skin prepped with chlorhexidine, draped in usual sterile fashion, infiltrated locally with 1% lidocaine. 10 cm 5 [place] multi sidehole Yueh sheath needle advanced into the collection. 330 mL opaque tan fluid were aspirated, sample sent for the requested laboratory studies. Postprocedure scans show significant decompression of the complex collection with no significant large residual component evident on ultrasound. The patient tolerated the procedure well. IMPRESSION: 1. Technically successful ultrasound-guided aspiration of left upper quadrant retroperitoneal collection, removing 330 mL opaque tan fluid, sent for requested laboratory studies. Electronically Signed   By: Lucrezia Europe M.D.   On: 12/29/2021 17:09   CT ABDOMEN PELVIS W CONTRAST  Result Date: 12/29/2021 CLINICAL DATA:  pancreatic pseudocyst, follow-up study EXAM: CT ABDOMEN AND PELVIS WITH CONTRAST TECHNIQUE: Multidetector CT imaging of the abdomen and pelvis was performed using the standard protocol following bolus administration of intravenous contrast. RADIATION DOSE REDUCTION: This exam was performed according to the departmental dose-optimization program which includes automated exposure control, adjustment of the mA and/or kV according to patient size and/or use of iterative reconstruction technique. CONTRAST:  176mL OMNIPAQUE IOHEXOL 300 MG/ML  SOLN COMPARISON:  December 24, 2021 FINDINGS: Lower chest: Previously seen small left pleural effusion and left basilar atelectasis has significantly improved in the interim. Again seen are the  extensive coronary artery atheromatous calcifications. Hepatobiliary: Hepatic surface is nodular and lobulated and these morphological changes in keeping with cirrhosis. No discrete mass lesion is identified. Status post cholecystectomy. No intrahepatic biliary dilatation. Again seen is the severe dilatation of the CBD measuring approximately 1.7 cm, tapering within the pancreatic head likely related to changes of chronic pancreatitis and reservoir effect of cholecystectomy. Pancreas: Moderate pancreatic parenchymal atrophy. Intraparenchymal loculated fluid collection at the distal body and tail of the pancreas in keeping with a pancreatic pseudocyst measuring approximately 2.5 x 4 cm in comparison to 2.6 x  4.2 cm on the previous study. This fluid collection is in continuity with the fluid collection in the left posterior pararenal space which represent retroperitoneal/posterior parietal wall extension of the pancreatic pseudocyst. This rim enhancing septated collection measures approximately 9.1 x 7.5 cm in comparison to 8.8 x 8 cm on the previous study. The fluid pocket extending into the posterior parietal wall measures 9.2 x 3.7 cm (image 48/2) in comparison to 7 x 2.5 cm on the previous study and has increased. Spleen: Mild splenomegaly. Adrenals/Urinary Tract: Adrenal glands are unremarkable. Kidneys are normal, without renal calculi, focal lesion, or hydronephrosis except for the anterior and medial displacement of the left kidney of pseudocyst of the pancreas, stable. Bladder is unremarkable. Stomach/Bowel: Stomach is within normal limits. Appendix appears normal. No evidence of bowel wall thickening, distention, or inflammatory changes. Moderately large stool burden in the colon. Vascular/Lymphatic: Aortic atherosclerosis. No enlarged abdominal or pelvic lymph nodes. Reproductive: Uterus and bilateral adnexa are unremarkable. Other: There is a moderately large abdominopelvic ascites seen without significant  interval change. Musculoskeletal: Mild lumbar spondylosis.  No fracture seen. IMPRESSION: 1. Again seen are the findings of cirrhosis of the liver and portal hypertension with small gastric varices, moderately large ascites and mild splenomegaly without significant interval change. 2. The pseudocyst at the distal body-tail of the pancreas has minimally decreased in the interim. The septated, rim enhancing fluid collection within the left posterior pararenal space measuring up to 12 cm in the greatest craniocaudad dimension is without significant interval change. The loculated pseudocyst component extending into the left posterior parietal wall has increased in the interim measuring 9.2 x 3.7 cm in comparison to 7 x 2.5 cm on the previous study. This is pseudocyst of the pancreas is amenable for a drain insertion in the appropriate clinical setting. 3.  Moderately large stool burden in the colon of constipation. 4.  Aortic atherosclerosis (ICD10-I70.0) Electronically Signed   By: Frazier Richards M.D.   On: 12/29/2021 12:51   US Paracentesis  Result Date: 12/27/2021 INDICATION: Patient with history of pancreatic pseudocyst, chronic pancreatitis, alcoholic cirrhosis, ascites, chronic kidney disease. Request received for diagnostic paracentesis up to 500 cc. EXAM: ULTRASOUND GUIDED DIAGNOSTIC PARACENTESIS MEDICATIONS: 8 mL 1% lidocaine COMPLICATIONS: None immediate. PROCEDURE: Informed written consent was obtained from the patient after a discussion of the risks, benefits and alternatives to treatment. A timeout was performed prior to the initiation of the procedure. Initial ultrasound scanning demonstrates a small to moderate amount of ascites within the right upper to mid abdominal quadrant. The right upper to mid abdomen was prepped and draped in the usual sterile fashion. 1% lidocaine was used for local anesthesia. Following this, a 19 gauge, 7-cm, Yueh catheter was introduced. An ultrasound image was saved for  documentation purposes. The paracentesis was performed. The catheter was removed and a dressing was applied. The patient tolerated the procedure well without immediate post procedural complication. FINDINGS: A total of approximately 500 cc of yellow fluid was removed. Samples were sent to the laboratory as requested by the clinical team. IMPRESSION: Successful ultrasound-guided diagnostic paracentesis yielding 500 cc of peritoneal fluid. PLAN: If the patient eventually requires >/=2 paracenteses in a 30 day period, candidacy for formal evaluation by the [facility] will be assessed. Read by: Rowe Robert, PA-C Electronically Signed   By: Jerilynn Mages.  Shick M.D.   On: 12/27/2021 14:01   US Paracentesis  Result Date: 12/26/2021 INDICATION: Patient with history of pancreatic pseudocyst, chronic pancreatitis, alcoholic cirrhosis,  cirrhosis by imaging, ascites, chronic kidney disease; request received for diagnostic and therapeutic paracentesis up to 1 liter. EXAM: ULTRASOUND GUIDED DIAGNOSTIC AND THERAPEUTIC PARACENTESIS MEDICATIONS: 8 mL 1% lidocaine COMPLICATIONS: None immediate. PROCEDURE: Informed written consent was obtained from the patient after a discussion of the risks, benefits and alternatives to treatment. A timeout was performed prior to the initiation of the procedure. Initial ultrasound scanning demonstrates a small to moderate amount of ascites within the right upper to mid abdominal quadrant. The right upper to mid abdomen was prepped and draped in the usual sterile fashion. 1% lidocaine was used for local anesthesia. Following this, a 19 gauge, 7-cm, Yueh catheter was introduced. An ultrasound image was saved for documentation purposes. The paracentesis was performed. The catheter was removed and a dressing was applied. The patient tolerated the procedure well without immediate post procedural complication. FINDINGS: A total of approximately 1 liter of  yellow fluid was removed. Samples were sent to the laboratory as requested by the clinical team. IMPRESSION: Successful ultrasound-guided diagnostic and therapeutic paracentesis yielding 1 liter of peritoneal fluid. PLAN: If the patient eventually requires >/=2 paracenteses in a 30 day period, candidacy for formal evaluation by the [facility] will be assessed. Read by: Rowe Robert, PA-C Electronically Signed   By: Miachel Roux M.D.   On: 12/26/2021 13:11   CT ABDOMEN PELVIS W CONTRAST  Result Date: 12/24/2021 CLINICAL DATA:  Renal mass/cyst, indeterminate renal fluid collection EXAM: CT ABDOMEN AND PELVIS WITH CONTRAST TECHNIQUE: Multidetector CT imaging of the abdomen and pelvis was performed using the standard protocol following bolus administration of intravenous contrast. RADIATION DOSE REDUCTION: This exam was performed according to the departmental dose-optimization program which includes automated exposure control, adjustment of the mA and/or kV according to patient size and/or use of iterative reconstruction technique. CONTRAST:  167mL OMNIPAQUE IOHEXOL 300 MG/ML  SOLN COMPARISON:  Noncontrast CT 12/24/2021, MRI 10/13/2021 FINDINGS: Lower chest: Small left pleural effusion with associated left basilar compressive atelectasis. Mild discoid atelectasis within the right lung base. Extensive coronary artery calcification. Global cardiac size within normal limits. Hepatobiliary: Morphologic changes in keeping with cirrhosis. Geographic peripheral areas of enhancement within the liver predominantly within segments 4 and 8 with associated capsular retraction may represent areas of confluent hepatic fibrosis. No definite intrahepatic enhancing mass identified. Stable moderate extrahepatic biliary ductal dilation, tapering within the pancreatic head likely related to changes of chronic pancreatitis. Cholecystectomy has been performed. Pancreas: Mild pancreatic  parenchymal atrophy. Intraparenchymal loculated fluid collection within the tail the pancreas in keeping with a pancreatic pseudocyst appears decreased when compared to remote prior MRI examination, measuring 2.7 x 4.2 cm at axial image # 31/2, but is in continuity with the fluid collection seen within the a left posterior pararenal space which represents retroperitoneal extension of the pancreatic pseudocyst. This rim enhancing, septated collection measures 8.0 x 8.9 x 12.9 cm in greatest dimension on axial image # 41 and coronal image # 92. Spleen: Unremarkable Adrenals/Urinary Tract: The adrenal glands are unremarkable. The kidneys are normal in size and position. Mild mass effect upon the left kidney related to the left posterior pararenal pseudocyst. No hydronephrosis. No enhancing intrarenal masses. No intrarenal or ureteral calculi. The bladder is decompressed. Stomach/Bowel: Mild sigmoid diverticulosis. Small gastric varices are noted within the gastric fundus. Stomach, small bowel, and large bowel are otherwise unremarkable. Appendix normal. Moderate ascites is present, increased in volume when compared to prior MRI examination. No free intraperitoneal gas. Vascular/Lymphatic: Aortic atherosclerosis.  No enlarged abdominal or pelvic lymph nodes. Reproductive: Uterus and bilateral adnexa are unremarkable. Other: No abdominal wall hernia Musculoskeletal: No acute bone abnormality. IMPRESSION: 1. Morphologic changes in keeping with cirrhosis and portal venous hypertension with small gastric varices. Moderate ascites, increased in volume when compared to prior MRI examination. 2. Interval decrease in size of a pancreatic pseudocyst within the tail the pancreas since MRI examination 10/13/2021, however, there is now a septated, rim-enhancing fluid collection within the left posterior pararenal space measuring up to 12.9 cm in greatest dimension. This is in continuity with the pancreatic pseudocyst and represents  retroperitoneal extension of the pancreatic pseudocyst. 3. Extensive coronary artery calcification. 4. Mild sigmoid diverticulosis. Aortic Atherosclerosis (ICD10-I70.0). Electronically Signed   By: Fidela Salisbury M.D.   On: 12/24/2021 19:29   CT Renal Stone Study  Result Date: 12/24/2021 CLINICAL DATA:  Left flank swelling for the past 5 days. EXAM: CT ABDOMEN AND PELVIS WITHOUT CONTRAST TECHNIQUE: Multidetector CT imaging of the abdomen and pelvis was performed following the standard protocol without IV contrast. RADIATION DOSE REDUCTION: This exam was performed according to the departmental dose-optimization program which includes automated exposure control, adjustment of the mA and/or kV according to patient size and/or use of iterative reconstruction technique. COMPARISON:  10/13/2021 MRI FINDINGS: Lower chest: Right hemidiaphragm elevation. Bibasilar atelectasis. Normal heart size with right coronary artery calcification. Trace left pleural fluid. Hepatobiliary: Advanced cirrhosis, with caudate lobe enlargement. Cholecystectomy. Possible stones within the cystic duct remnant on 32/2. Common duct dilatation is suboptimally evaluated but felt to be less impressive than on the prior MRI. Pancreas: Suboptimally evaluated fluid collection adjacent the pancreatic tail measures on the order of 6.3 x 2.6 cm on 35/2 versus 6.2 x 3.8 cm when remeasured in a similar fashion on the prior MRI. Spleen: Normal noncontrast appearance including at 12.1 cm craniocaudal. Adrenals/Urinary Tract: Normal adrenal glands. Normal noncontrast appearance of the right kidney. A new fluid collection within the left perirenal space measures 7.4 x 4.8 cm on 36/2. An adjacent or contiguous more superficial component, extending into the left flank, is also new since the prior MRI at 5.5 x 3.2 cm on 39/2. No hydronephrosis. No bladder calculi. Stomach/Bowel: Proximal gastric underdistention. Extensive colonic diverticulosis. Normal  appendix. Normal small bowel caliber. Vascular/Lymphatic: Aortic atherosclerosis. Favor portosystemic collaterals within the omentum indicative of portal venous hypertension. No abdominopelvic adenopathy. Reproductive: Normal uterus and adnexa. Other: Moderate volume abdominopelvic ascites, similar to the prior MRI. No free intraperitoneal air. Anasarca is asymmetric left. Musculoskeletal: No acute osseous abnormality. IMPRESSION: 1. Limited exam secondary to stone study technique 2. Development of left perinephric fluid collection with adjacent component versus satellite collection extending into the left flank. Given clinical history, most likely related to progressive pseudocysts. 3. Relatively similar appearance of a presumed pseudocyst adjacent the pancreatic tail. 4. Cirrhosis with probable portal venous hypertension and moderate volume ascites. Small left pleural effusion. 5. Age advanced coronary artery atherosclerosis. Recommend assessment of coronary risk factors. 6. Cholecystectomy with possible stones within the cystic duct remnant. 7.  Aortic Atherosclerosis (ICD10-I70.0). Electronically Signed   By: Abigail Miyamoto M.D.   On: 12/24/2021 15:14    Labs:  Recent Labs    01/22/22 1217  WBC 15.7*  HGB 10.0*  HCT 30.7*  PLT 261   Recent Labs    01/22/22 1217  NA 126*  K 3.7  CL 93*  CO2 25  GLUCOSE 174*  BUN 10  CREATININE 0.97  CALCIUM 8.4*   Recent Labs  01/22/22 1217  PROT 6.6  ALBUMIN 2.5*  AST 20  ALT 9  ALKPHOS 116  BILITOT 1.3*   No results for input(s): "HEPBSAG", "HCVAB", "HEPAIGM", "HEPBIGM" in the last 72 hours. Recent Labs    01/22/22 1217  LABPROT 17.8*  INR 1.5*    Past Medical History:  Diagnosis Date   Alcohol-induced chronic pancreatitis (HCC)    Alcoholic cirrhosis (HCC)    B12 deficiency    Concussion    DDD (degenerative disc disease), cervical    Diabetes (HCC)    DKA, type 2 (HCC)    Gastric outlet obstruction 08/12/2018   Hypertension     Neuropathy    Pancreatic pseudocyst/cyst 05/06/2013   Seasonal allergies     Past Surgical History:  Procedure Laterality Date   ANKLE ARTHROSCOPY Right 12/20/2020   Procedure: RIGHT ANKLE ARTHROSCOPIC DEBRIDEMENT;  Surgeon: Nadara Mustard, MD;  Location: [facility];  Service: Orthopedics;  Laterality: Right;   BIOPSY  01/19/2019   Procedure: BIOPSY;  Surgeon: Meridee Score Netty Starring., MD;  Location: [facility]COPY;  Service: Gastroenterology;;   CHOLECYSTECTOMY N/A 07/28/2016   Procedure: LAPAROSCOPIC CHOLECYSTECTOMY WITH INTRAOPERATIVE CHOLANGIOGRAM;  Surgeon: Rodman Pickle, MD;  Location: [facility] OR;  Service: General;  Laterality: N/A;   ESOPHAGOGASTRODUODENOSCOPY (EGD) WITH PROPOFOL N/A 08/13/2018   Procedure: ESOPHAGOGASTRODUODENOSCOPY (EGD) WITH PROPOFOL;  Surgeon: Lemar Lofty., MD;  Location: [facility] ENDOSCOPY;  Service: Gastroenterology;  Laterality: N/A;   ESOPHAGOGASTRODUODENOSCOPY (EGD) WITH PROPOFOL N/A 01/19/2019   Procedure: ESOPHAGOGASTRODUODENOSCOPY (EGD) WITH PROPOFOL;  Surgeon: Meridee Score Netty Starring., MD;  Location: [facility] END[facility]COPY;  Service: Gastroenterology;  Laterality: N/A;   ESOPHAGOGASTRODUODENOSCOPY (EGD) WITH PROPOFOL N/A 12/28/2021   Procedure: ESOPHAGOGASTRODUODENOSCOPY (EGD) WITH PROPOFOL;  Surgeon: Meridee Score Netty Starring., MD;  Location: [facility] ENDOSCOPY;  Service: Gastroenterology;  Laterality: N/A;   EUS N/A 12/28/2021   Procedure: UPPER ENDOSCOPIC ULTRASOUND (EUS) LINEAR;  Surgeon: Lemar Lofty., MD;  Location: [facility] ENDOSCOPY;  Service: Gastroenterology;  Laterality: N/A;   IR PARACENTESIS  08/08/2020   IR PARACENTESIS  03/13/2021   LEEP  1990's   ORIF FOOT FRACTURE  09/2008   L 5th metatarsal   REFRACTIVE SURGERY  2000   TIBIA IM NAIL INSERTION Left 03/12/2021   Procedure: INTRAMEDULLARY (IM) NAIL TIBIAL;  Surgeon: Kerrin Champagne, MD;  Location: [facility] OR;  Service: Orthopedics;  Laterality: Left;   TONSILLECTOMY     UPPER ESOPHAGEAL ENDOSCOPIC  ULTRASOUND (EUS) N/A 08/13/2018   Procedure: UPPER ESOPHAGEAL ENDOSCOPIC ULTRASOUND (EUS);  Surgeon: Lemar Lofty., MD;  Location: [facility] ENDOSCOPY;  Service: Gastroenterology;  Laterality: N/A;   UPPER ESOPHAGEAL ENDOSCOPIC ULTRASOUND (EUS) N/A 01/19/2019   Procedure: UPPER ESOPHAGEAL ENDOSCOPIC ULTRASOUND (EUS);  Surgeon: Lemar Lofty., MD;  Location: [facility] ENDOSCOPY;  Service: Gastroenterology;  Laterality: N/A;    Family History  Problem Relation Age of Onset   Other Mother        tachycardia.Marland KitchenMarland Kitchen?afib   Stroke Father        after hernia suegery   Prostate cancer Father    Other Father        global transient amnesia, unclear source   Atrial fibrillation Sister    Healthy Brother    Healthy Brother    Coronary artery disease Paternal Grandmother    Heart attack Paternal Grandmother 56   Brain cancer Maternal Grandfather        ?   Cancer Paternal Grandfather        ?   Breast cancer Maternal Grandmother  Prior to Admission medications   Medication Sig Start Date End Date Taking? Authorizing Provider  aspirin EC 81 MG EC tablet Take 1 tablet (81 mg total) by mouth 2 (two) times daily. Swallow whole. 03/17/21  Yes Jessy Oto, MD  Calcium Carb-Cholecalciferol (CALCIUM+D3 PO) Take 1 tablet by mouth daily.   Yes [provider]  cetirizine (ZYRTEC) 10 MG tablet Take 10 mg by mouth at bedtime.   Yes [provider]  cholecalciferol (VITAMIN D3) 25 MCG (1000 UNIT) tablet Take 1,000 Units by mouth daily.   Yes [provider]  cyanocobalamin 2000 MCG tablet Take 2,000 mcg by mouth daily.   Yes [provider]  furosemide (LASIX) 20 MG tablet Take 1 tablet (20 mg total) by mouth every other day. Patient taking differently: Take 20 mg by mouth See admin instructions. Take 20 mg by mouth every other morning 01/02/22  Yes Vann, Jessica U, DO  Insulin Glargine (BASAGLAR KWIKPEN) 100 UNIT/ML Inject 36 Units into the skin daily. Patient  taking differently: Inject 30 Units into the skin daily. 01/09/22  Yes Shamleffer, Melanie Crazier, MD  insulin lispro (HUMALOG KWIKPEN) 100 UNIT/ML KwikPen Inject 6-10 Units into the skin 3 (three) times daily as needed (high blood sugar). Max Daily 30 units. Sliding Scale Patient taking differently: Inject 2-10 Units into the skin See admin instructions. Inject 6-10 units into the skin before meals as needed for elevated BGL, PER SLIDING SCALE 01/02/22  Yes Vann, Jessica U, DO  meclizine (ANTIVERT) 25 MG tablet Take 1/2 tablet po qhs prn vertigo Patient taking differently: Take 25 mg by mouth daily as needed (for vertigo). 11/23/21  Yes Dugal, Lawerance Bach, FNP  Melatonin 10 MG TABS Take 20 mg by mouth at bedtime.   Yes [provider]  pantoprazole (PROTONIX) 40 MG tablet TAKE 1 TABLET BY MOUTH TWICE DAILY BEFORE A MEAL Patient taking differently: Take 40 mg by mouth 2 (two) times daily. 10/19/21  Yes Gatha Mayer, MD  spironolactone (ALDACTONE) 25 MG tablet Take 1 tablet (25 mg total) by mouth daily. 01/11/22  Yes Danford, Suann Larry, MD  traMADol (ULTRAM) 50 MG tablet Take 1 tablet (50 mg total) by mouth every 6 (six) hours as needed for up to 5 days. 01/17/22 01/22/22 Yes Bedsole, Amy E, MD  Blood Glucose Monitoring Suppl (ONE TOUCH ULTRA 2) w/Device KIT Use to check blood sugars two times day 08/01/18   Bedsole, Amy E, MD  glucose blood (ONETOUCH ULTRA) test strip CHECK BLOOD SUGAR TWICE DAILY AS DIRECTED 12/13/21   Bedsole, Amy E, MD  Insulin Pen Needle 32G X 4 MM MISC 1 Device by Does not apply route in the morning, at noon, in the evening, and at bedtime. 11/20/21   Shamleffer, Melanie Crazier, MD  Lancets [facility] ULTRASOFT) lancets Use to check blood sugar two times a day. 08/01/18   [name], MD    Current Facility-Administered Medications  Medication Dose Route Frequency Provider Last Rate Last Admin   0.9 %  sodium chloride infusion   Intravenous Continuous Marylyn Ishihara, Tyrone A, DO        famotidine (PEPCID) IVPB 20 mg premix  20 mg Intravenous Q12H Kyle, Tyrone A, DO       vancomycin (VANCOREADY) IVPB 1250 mg/250 mL  1,250 mg Intravenous Once Shade, Christine E, RPH 166.7 mL/hr at 01/22/22 1556 1,250 mg at 01/22/22 1556   [START ON 01/23/2022] vancomycin (VANCOREADY) IVPB 1500 mg/300 mL  1,500 mg Intravenous Q24H Shade, Christine  E, RPH       Current Outpatient Medications  Medication Sig Dispense Refill   aspirin EC 81 MG EC tablet Take 1 tablet (81 mg total) by mouth 2 (two) times daily. Swallow whole. 30 tablet 11   Calcium Carb-Cholecalciferol (CALCIUM+D3 PO) Take 1 tablet by mouth daily.     cetirizine (ZYRTEC) 10 MG tablet Take 10 mg by mouth at bedtime.     cholecalciferol (VITAMIN D3) 25 MCG (1000 UNIT) tablet Take 1,000 Units by mouth daily.     cyanocobalamin 2000 MCG tablet Take 2,000 mcg by mouth daily.     furosemide (LASIX) 20 MG tablet Take 1 tablet (20 mg total) by mouth every other day. (Patient taking differently: Take 20 mg by mouth See admin instructions. Take 20 mg by mouth every other morning)     Insulin Glargine (BASAGLAR KWIKPEN) 100 UNIT/ML Inject 36 Units into the skin daily. (Patient taking differently: Inject 30 Units into the skin daily.) 45 mL 3   insulin lispro (HUMALOG KWIKPEN) 100 UNIT/ML KwikPen Inject 6-10 Units into the skin 3 (three) times daily as needed (high blood sugar). Max Daily 30 units. Sliding Scale (Patient taking differently: Inject 2-10 Units into the skin See admin instructions. Inject 6-10 units into the skin before meals as needed for elevated BGL, PER SLIDING SCALE)     meclizine (ANTIVERT) 25 MG tablet Take 1/2 tablet po qhs prn vertigo (Patient taking differently: Take 25 mg by mouth daily as needed (for vertigo).) 20 tablet 0   Melatonin 10 MG TABS Take 20 mg by mouth at bedtime.     pantoprazole (PROTONIX) 40 MG tablet TAKE 1 TABLET BY MOUTH TWICE DAILY BEFORE A MEAL (Patient taking differently: Take 40 mg by mouth 2 (two)  times daily.) 180 tablet 1   spironolactone (ALDACTONE) 25 MG tablet Take 1 tablet (25 mg total) by mouth daily. 30 tablet 3   traMADol (ULTRAM) 50 MG tablet Take 1 tablet (50 mg total) by mouth every 6 (six) hours as needed for up to 5 days. 60 tablet 0   Blood Glucose Monitoring Suppl (ONE TOUCH ULTRA 2) w/Device KIT Use to check blood sugars two times day 1 each 0   glucose blood (ONETOUCH ULTRA) test strip CHECK BLOOD SUGAR TWICE DAILY AS DIRECTED 100 strip 3   Insulin Pen Needle 32G X 4 MM MISC 1 Device by Does not apply route in the morning, at noon, in the evening, and at bedtime. 400 each 3   Lancets (ONETOUCH ULTRASOFT) lancets Use to check blood sugar two times a day. 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  Tobacco Use   Smoking status: Never   Smokeless tobacco: Never  Vaping Use   Vaping Use: Never used  Substance and Sexual Activity   Alcohol use: Not Currently    Alcohol/week: 7.0 standard drinks of alcohol    Types: 7 Glasses of wine per week    Comment: wine   Drug use: No  Sexual activity: Not on file  Other Topics Concern   Not on file  Social History Narrative   Patient is married one stepson    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 not traveling   regular exercise , walking 4-5 times a week   Diet fruits and veggies, water   Alcohol at least several glasses of wine a week Stopped 08/2018   Never smoker, no drug use    Right handed   One story home   [place] in the am   Social Determinants of Health   Financial Resource Strain: Not on file  Food Insecurity: No Food Insecurity (01/22/2022)   Hunger Vital Sign    Worried About Running Out of Food in the Last Year: Never true    [place] in the Last Year: Never true  Transportation Needs: No Transportation Needs (01/22/2022)   PRAPARE - Hydrologist (Medical): No    Lack of Transportation (Non-Medical): No  Physical Activity: Not on file  Stress: Not on file  Social Connections: Not on file  Intimate Partner Violence: Not At Risk (01/22/2022)   Humiliation, Afraid, Rape, and Kick questionnaire    Fear of Current or Ex-Partner: No    Emotionally Abused: No    Physically Abused: No    Sexually Abused: No    Review of Systems: All systems reviewed and negative except where noted in HPI.  Physical Exam: Vital signs in last 24 hours: Temp:  [99.1 F (37.3 C)-99.7 F (37.6 C)] 99.7 F (37.6 C) (09/18 1548) Pulse Rate:  [105-136] 136 (09/18 1430) Resp:  [12-31] 31 (09/18 1430) BP: (113-140)/(63-95) 113/63 (09/18 1430) SpO2:  [98 %-100 %] 100 % (09/18 1430)    General:  Alert female in NAD Psych:  Pleasant, cooperative. Normal mood and affect Eyes: Pupils equal, no icterus. Conjunctive pink Ears:  Normal auditory acuity Nose: No deformity, discharge or lesions Neck:  Supple, no masses felt Lungs:  Clear to auscultation.  Heart:  Regular rate, regular rhythm. No lower extremity edema Abdomen:  Soft, distended, nontender, active bowel sounds, no masses felt Left lower back : large , firm, erythematous, tender area ( grapefruit size). The top of mass is scaly and oozing green drainage. Area surrounding the grapefruit size lesion is thick and hard Rectal :  Deferred Msk: Symmetrical without gross deformities.  Neurologic:  Alert, oriented, grossly normal neurologically Skin:  Intact without significant lesions.     Intake/Output from previous day: No intake/output data recorded. Intake/Output this shift:  No intake/output data recorded.    Principal Problem:   Sepsis ([place]) Active Problems:   HTN (hypertension)   Chronic hyponatremia   Type 2 diabetes mellitus with hyperglycemia, with long-term current use of insulin (HCC)   Pancreatic pseudocyst   Ascites   Cellulitis   Allergic reaction    Tye Savoy, NP-C @  01/22/2022, 4:25 PM

## 2022-01-22 NOTE — ED Notes (Signed)
After starting Rocephin, Pt. Had an allergic reaction that consisted of a red rash all over. Provider aware and notified. See MAR for further details.

## 2022-01-22 NOTE — Sepsis Progress Note (Signed)
eLink monitoring code sepsis.  

## 2022-01-23 ENCOUNTER — Inpatient Hospital Stay (HOSPITAL_COMMUNITY): Payer: 59

## 2022-01-23 ENCOUNTER — Ambulatory Visit: Payer: 59 | Admitting: Nurse Practitioner

## 2022-01-23 ENCOUNTER — Ambulatory Visit: Payer: 59 | Admitting: Internal Medicine

## 2022-01-23 DIAGNOSIS — L03312 Cellulitis of back [any part except buttock]: Secondary | ICD-10-CM

## 2022-01-23 DIAGNOSIS — K8689 Other specified diseases of pancreas: Secondary | ICD-10-CM

## 2022-01-23 DIAGNOSIS — A419 Sepsis, unspecified organism: Secondary | ICD-10-CM | POA: Diagnosis not present

## 2022-01-23 DIAGNOSIS — K7031 Alcoholic cirrhosis of liver with ascites: Secondary | ICD-10-CM | POA: Diagnosis not present

## 2022-01-23 DIAGNOSIS — K859 Acute pancreatitis without necrosis or infection, unspecified: Secondary | ICD-10-CM | POA: Diagnosis not present

## 2022-01-23 DIAGNOSIS — R6520 Severe sepsis without septic shock: Secondary | ICD-10-CM | POA: Diagnosis not present

## 2022-01-23 DIAGNOSIS — R935 Abnormal findings on diagnostic imaging of other abdominal regions, including retroperitoneum: Secondary | ICD-10-CM

## 2022-01-23 HISTORY — PX: IR US GUIDE BX ASP/DRAIN: IMG2392

## 2022-01-23 LAB — COMPREHENSIVE METABOLIC PANEL
ALT: 7 U/L (ref 0–44)
AST: 16 U/L (ref 15–41)
Albumin: 2.2 g/dL — ABNORMAL LOW (ref 3.5–5.0)
Alkaline Phosphatase: 94 U/L (ref 38–126)
Anion gap: 9 (ref 5–15)
BUN: 12 mg/dL (ref 6–20)
CO2: 23 mmol/L (ref 22–32)
Calcium: 8.2 mg/dL — ABNORMAL LOW (ref 8.9–10.3)
Chloride: 98 mmol/L (ref 98–111)
Creatinine, Ser: 1.04 mg/dL — ABNORMAL HIGH (ref 0.44–1.00)
GFR, Estimated: >60 mL/min (ref >60)
Glucose, Bld: 218 mg/dL — ABNORMAL HIGH (ref 70–99)
Potassium: 4.2 mmol/L (ref 3.5–5.1)
Sodium: 130 mmol/L — ABNORMAL LOW (ref 135–145)
Total Bilirubin: 0.9 mg/dL (ref 0.3–1.2)
Total Protein: 5.9 g/dL — ABNORMAL LOW (ref 6.5–8.1)

## 2022-01-23 LAB — GLUCOSE, CAPILLARY
Glucose-Capillary: 193 mg/dL — ABNORMAL HIGH (ref 70–99)
Glucose-Capillary: 215 mg/dL — ABNORMAL HIGH (ref 70–99)
Glucose-Capillary: 220 mg/dL — ABNORMAL HIGH (ref 70–99)
Glucose-Capillary: 227 mg/dL — ABNORMAL HIGH (ref 70–99)
Glucose-Capillary: 259 mg/dL — ABNORMAL HIGH (ref 70–99)
Glucose-Capillary: 270 mg/dL — ABNORMAL HIGH (ref 70–99)

## 2022-01-23 LAB — BODY FLUID CELL COUNT WITH DIFFERENTIAL
Lymphs, Fluid: 7 %
Monocyte-Macrophage-Serous Fluid: 71 % (ref 50–90)
Neutrophil Count, Fluid: 22 % (ref 0–25)
Total Nucleated Cell Count, Fluid: 234 cu mm (ref 0–1000)

## 2022-01-23 LAB — CBC
HCT: 29.7 % — ABNORMAL LOW (ref 36.0–46.0)
Hemoglobin: 9.6 g/dL — ABNORMAL LOW (ref 12.0–15.0)
MCH: 28.7 pg (ref 26.0–34.0)
MCHC: 32.3 g/dL (ref 30.0–36.0)
MCV: 88.9 fL (ref 80.0–100.0)
Platelets: 217 10*3/uL (ref 150–400)
RBC: 3.34 MIL/uL — ABNORMAL LOW (ref 3.87–5.11)
RDW: 13.2 % (ref 11.5–15.5)
WBC: 10.1 10*3/uL (ref 4.0–10.5)
nRBC: 0 % (ref 0.0–0.2)

## 2022-01-23 LAB — LACTATE DEHYDROGENASE, PLEURAL OR PERITONEAL FLUID: LD, Fluid: 44 U/L — ABNORMAL HIGH (ref 3–23)

## 2022-01-23 LAB — PROTIME-INR
INR: 1.8 — ABNORMAL HIGH (ref 0.8–1.2)
Prothrombin Time: 20.7 seconds — ABNORMAL HIGH (ref 11.4–15.2)

## 2022-01-23 LAB — CORTISOL-AM, BLOOD: Cortisol - AM: 5.8 ug/dL — ABNORMAL LOW (ref 6.7–22.6)

## 2022-01-23 LAB — UREA NITROGEN, URINE

## 2022-01-23 LAB — GLUCOSE, PLEURAL OR PERITONEAL FLUID: Glucose, Fluid: 240 mg/dL

## 2022-01-23 LAB — ALBUMIN, PLEURAL OR PERITONEAL FLUID: Albumin, Fluid: <1.5 g/dL

## 2022-01-23 LAB — PROCALCITONIN: Procalcitonin: 0.45 ng/mL

## 2022-01-23 LAB — C-REACTIVE PROTEIN: CRP: 12.1 mg/dL — ABNORMAL HIGH (ref <1.0)

## 2022-01-23 LAB — SEDIMENTATION RATE: Sed Rate: 14 mm/hr (ref 0–22)

## 2022-01-23 MED ORDER — INSULIN ASPART 100 UNIT/ML IJ SOLN
0.0000 [IU] | Freq: Three times a day (TID) | INTRAMUSCULAR | Status: DC
  Administered 2022-01-23: 2 [IU] via SUBCUTANEOUS
  Administered 2022-01-23: 5 [IU] via SUBCUTANEOUS
  Administered 2022-01-23: 2 [IU] via SUBCUTANEOUS
  Administered 2022-01-24: 3 [IU] via SUBCUTANEOUS
  Administered 2022-01-24: 5 [IU] via SUBCUTANEOUS
  Administered 2022-01-24 – 2022-01-25 (×2): 2 [IU] via SUBCUTANEOUS
  Administered 2022-01-26 (×3): 3 [IU] via SUBCUTANEOUS
  Administered 2022-01-27: 2 [IU] via SUBCUTANEOUS
  Administered 2022-01-27: 1 [IU] via SUBCUTANEOUS
  Administered 2022-01-28: 3 [IU] via SUBCUTANEOUS
  Administered 2022-01-28 (×2): 2 [IU] via SUBCUTANEOUS

## 2022-01-23 MED ORDER — VITAMIN K1 10 MG/ML IJ SOLN
10.0000 mg | Freq: Every day | INTRAVENOUS | Status: AC
  Administered 2022-01-23 – 2022-01-24 (×2): 10 mg via INTRAVENOUS
  Filled 2022-01-23 (×3): qty 1

## 2022-01-23 MED ORDER — LIDOCAINE HCL 1 % IJ SOLN
INTRAMUSCULAR | Status: AC
  Administered 2022-01-23: 20 mL
  Filled 2022-01-23: qty 20

## 2022-01-23 MED ORDER — INSULIN ASPART 100 UNIT/ML IJ SOLN
0.0000 [IU] | Freq: Every day | INTRAMUSCULAR | Status: DC
  Administered 2022-01-23: 3 [IU] via SUBCUTANEOUS
  Administered 2022-01-23 – 2022-01-24 (×2): 2 [IU] via SUBCUTANEOUS
  Administered 2022-01-25 – 2022-01-27 (×2): 3 [IU] via SUBCUTANEOUS
  Administered 2022-01-28: 4 [IU] via SUBCUTANEOUS

## 2022-01-23 MED ORDER — TRAMADOL HCL 50 MG PO TABS
50.0000 mg | ORAL_TABLET | Freq: Once | ORAL | Status: AC
  Administered 2022-01-23: 50 mg via ORAL
  Filled 2022-01-23: qty 1

## 2022-01-23 MED ORDER — LIDOCAINE HCL 1 % IJ SOLN
INTRAMUSCULAR | Status: AC
  Filled 2022-01-23: qty 20

## 2022-01-23 MED ORDER — INSULIN ASPART 100 UNIT/ML IJ SOLN: 0.0000 [IU] | Freq: Three times a day (TID) | INTRAMUSCULAR | Status: DC

## 2022-01-23 MED ORDER — SODIUM CHLORIDE 0.9 % IV BOLUS
500.0000 mL | Freq: Once | INTRAVENOUS | Status: AC
  Administered 2022-01-23: 500 mL via INTRAVENOUS

## 2022-01-23 MED ORDER — LIDOCAINE HCL 1 % IJ SOLN
INTRAMUSCULAR | Status: AC | PRN
  Administered 2022-01-23: 10 mL

## 2022-01-23 NOTE — Progress Notes (Signed)
PROGRESS NOTE    Mallory GageKathleen L Blazier  ZOX:[id number] DOB: 07-18-1960 DOA: 01/22/2022  PCP: Excell SeltzerBedsole, Amy E, MD   Brief Narrative:  This 61 yrs old female with PMH significant for chronic pancreatitis with pseudocyst with extension into retroperitoneum, type 2 diabetes, cirrhosis  hypertension presented in the ED with abdominal distention and erythema of the left flank.  She was recently admitted in the hospital for essentially the same symptoms, She reports she was doing well until last night, noticed that she has chills and left flank was draining.  She denies any fever, nausea and vomiting.  CT scan suggest cellulitis of the area and slight decrease in the fluid collection.  Patient was admitted for severe sepsis secondary to cellulitis and draining pseudocyst.  GI is consulted.  Patient underwent paracentesis for ascites.  Assessment & Plan:   Principal Problem:   Sepsis (HCC) Active Problems:   Ascites   HTN (hypertension)   Chronic hyponatremia   Type 2 diabetes mellitus with hyperglycemia, with long-term current use of insulin (HCC)   Pancreatic pseudocyst   Cellulitis   Allergic reaction  Severe sepsis secondary to cellulitis and draining pseudocyst: Patient presented with tachycardia, tachypnea, cellulitis, lactic acid 2.0. Patient initiated on Rocephin and vancomycin but she has  reaction to Rocephin.   Patient has developed redness.  Continue vancomycin. Continue IV fluids.  Trend lactic acid.  Follow blood cultures. GI was consulted recommended continue antibiotics and ID consult.  ID consulted.  Allergic reaction: It appears patient had a allergic reaction to Rocephin. Patient was given Decadron and Benadryl.  Pepcid. She feels better.  Ascites: History of alcoholic cirrhosis IR consulted patient underwent ultrasound-guided paracentesis 2.9 L of clear fluid drained.   Follow-up cultures, cell studies.  Type 2 diabetes: Hold diabetic medications. Continue regular  insulin sliding scale,  carb modified diet  Essential hypertension: Holding Lasix and spironolactone in the setting of sepsis,  Continue as needed propranolol.  Chronic hyponatremia Baseline sodium remains around 130 Continue IV hydration.  Monitor serum sodium:  DVT prophylaxis:SCDs Code Status: Full code. Family Communication: No family at bed side. Disposition Plan:   Status is: Inpatient Remains inpatient appropriate because: Admitted for ascites, underwent ultrasound-guided paracentesis..  Also developed severe sepsis secondary to cellulitis and draining pseudocyst.  Requiring IV antibiotics ID is consulted.    Consultants:  Infectious diseases Gastroenterology  Procedures: Ultrasound-guided paracentesis Antimicrobials:  Anti-infectives (From admission, onward)    Start     Dose/Rate Route Frequency Ordered Stop   01/23/22 1400  vancomycin (VANCOREADY) IVPB 1500 mg/300 mL        1,500 mg 150 mL/hr over 120 Minutes Intravenous Every 24 hours 01/22/22 1604     01/22/22 1800  Ampicillin-Sulbactam (UNASYN) 3 g in sodium chloride 0.9 % 100 mL IVPB        3 g 200 mL/hr over 30 Minutes Intravenous Every 6 hours 01/22/22 1714     01/22/22 1345  cefTRIAXone (ROCEPHIN) 2 g in sodium chloride 0.9 % 100 mL IVPB        2 g 200 mL/hr over 30 Minutes Intravenous  Once 01/22/22 1342 01/22/22 1430   01/22/22 1345  vancomycin (VANCOREADY) IVPB 1250 mg/250 mL        1,250 mg 166.7 mL/hr over 90 Minutes Intravenous  Once 01/22/22 1342 01/23/22 0435       Subjective: Patient was seen and examined at bedside.  Overnight events noted.  Patient came back from ultrasound-guided paracentesis reports feeling better.  2.9 L of yellow-colored fluid drained.  Objective: Vitals:   01/23/22 0824 01/23/22 0900 01/23/22 1035 01/23/22 1102  BP: 102/71 108/79 119/77 103/73  Pulse: 78   75  Resp: 17   17  Temp: 97.9 F (36.6 C)   97.6 F (36.4 C)  TempSrc: Oral   Oral  SpO2: 99%   100%   Weight:      Height:        Intake/Output Summary (Last 24 hours) at 01/23/2022 1311 Last data filed at 01/23/2022 0900 Gross per 24 hour  Intake 2830.97 ml  Output --  Net 2830.97 ml   Filed Weights   01/22/22 1725  Weight: 70.4 kg    Examination:  General exam: Appears comfortable, not in any acute distress. Respiratory system: CTA bilaterally, respiratory effort normal, RR 15 Cardiovascular system: S1 & S2 heard, regular rate and rhythm, no murmur.   Gastrointestinal system: Abdomen is distended, soft, nontender, BS + Central nervous system: Alert and oriented x 3. No focal neurological deficits. Back: Erythema and drainage in the left flank. Extremities: No edema, no cyanosis, no clubbing. Skin: No rashes, lesions or ulcers Psychiatry: Judgement and insight appear normal. Mood & affect appropriate.     Data Reviewed: I have personally reviewed following labs and imaging studies  CBC: Recent Labs  Lab 01/22/22 1217 01/23/22 0508  WBC 15.7* 10.1  NEUTROABS 13.6*  --   HGB 10.0* 9.6*  HCT 30.7* 29.7*  MCV 87.7 88.9  PLT 261 829   Basic Metabolic Panel: Recent Labs  Lab 01/17/22 0954 01/22/22 1217 01/22/22 1600 01/23/22 0508  NA 132* 126* 128* 130*  K 4.6 3.7 3.7 4.2  CL 95* 93* 94* 98  CO2 27 25 26 23   GLUCOSE 114* 174* 110* 218*  BUN 11 10 10 12   CREATININE 1.03 0.97 0.91 1.04*  CALCIUM 8.9 8.4* 8.5* 8.2*   GFR: Estimated Creatinine Clearance: 60.9 mL/min (A) (by C-G formula based on SCr of 1.04 mg/dL (H)). Liver Function Tests: Recent Labs  Lab 01/17/22 0954 01/22/22 1217 01/23/22 0508  AST 22 20 16   ALT 7 9 7   ALKPHOS 120* 116 94  BILITOT 0.7 1.3* 0.9  PROT 7.0 6.6 5.9*  ALBUMIN 3.0* 2.5* 2.2*   Recent Labs  Lab 01/22/22 1217  LIPASE 18   No results for input(s): "AMMONIA" in the last 168 hours. Coagulation Profile: Recent Labs  Lab 01/22/22 1217 01/23/22 0508  INR 1.5* 1.8*   Cardiac Enzymes: No results for input(s):  "CKTOTAL", "CKMB", "CKMBINDEX", "TROPONINI" in the last 168 hours. BNP (last 3 results) Recent Labs    11/28/21 0831  PROBNP 69.0   HbA1C: No results for input(s): "HGBA1C" in the last 72 hours. CBG: Recent Labs  Lab 01/22/22 2012 01/23/22 0010 01/23/22 0411 01/23/22 0725 01/23/22 1124  GLUCAP 170* 259* 220* 193* 227*   Lipid Profile: No results for input(s): "CHOL", "HDL", "LDLCALC", "TRIG", "CHOLHDL", "LDLDIRECT" in the last 72 hours. Thyroid Function Tests: Recent Labs    01/22/22 1339  TSH 8.744*   Anemia Panel: No results for input(s): "VITAMINB12", "FOLATE", "FERRITIN", "TIBC", "IRON", "RETICCTPCT" in the last 72 hours. Sepsis Labs: Recent Labs  Lab 01/22/22 1217 01/22/22 1600 01/23/22 0508  PROCALCITON  --   --  0.45  LATICACIDVEN 2.0* 2.0*  --     Recent Results (from the past 240 hour(s))  Blood Culture (routine x 2)     Status: None (Preliminary result)   Collection Time: 01/22/22 12:17 PM  Specimen: BLOOD  Result Value Ref Range Status   Specimen Description   Final    BLOOD RIGHT ANTECUBITAL Performed at [facility], [address], [place], [place] [address]    Special Requests   Final    BOTTLES DRAWN AEROBIC AND ANAEROBIC Blood Culture adequate volume Performed at [facility], [address], [place], [place] [address]    Culture   Final    NO GROWTH < 24 HOURS Performed at [facility], [address], [place], [place] [address]    Report Status PENDING  Incomplete  Blood Culture (routine x 2)     Status: None (Preliminary result)   Collection Time: 01/22/22  5:06 PM   Specimen: BLOOD  Result Value Ref Range Status   Specimen Description   Final    BLOOD SITE NOT SPECIFIED Performed at [facility], [address] [place], [place] [address]    Special Requests   Final    BOTTLES DRAWN AEROBIC ONLY Blood Culture adequate volume Performed at [facility], [address] [place], [place] [address]    Culture   Final    NO GROWTH < 12 HOURS Performed at [facility], [address] [place], [place] [address]    Report Status PENDING  Incomplete    Radiology Studies: US Paracentesis  Result Date: 01/23/2022 INDICATION: Patient with history of pancreatic pseudocyst, chronic pancreatitis, alcoholic cirrhosis, recurrent ascites. Request received for diagnostic and therapeutic paracentesis up to 3 liters. EXAM: ULTRASOUND GUIDED DIAGNOSTIC AND THERAPEUTIC PARACENTESIS MEDICATIONS: 10 ml 1% lidocaine COMPLICATIONS: None immediate. PROCEDURE: Informed written consent was obtained from the patient after a discussion of the risks, benefits and alternatives to treatment. A timeout was performed prior to the initiation of the procedure. Initial ultrasound scanning demonstrates a moderate amount of ascites within the right lower abdominal quadrant. The right lower abdomen was prepped and draped in the usual sterile fashion. 1% lidocaine was used for local anesthesia. Following this, a 19 gauge, 10-cm, Yueh catheter was introduced. An ultrasound image was saved for documentation purposes. The paracentesis was performed. The catheter was removed and a dressing was applied. The patient tolerated the procedure well without immediate post procedural complication. FINDINGS: A total of approximately 2.9 liters of yellow fluid was removed. Samples were sent to the laboratory as requested by the clinical team. IMPRESSION: Successful ultrasound-guided diagnostic and therapeutic paracentesis yielding 2.9 liters of peritoneal fluid. PLAN: The patient has required >/=2 paracenteses in a 30 day period and a formal evaluation by the [facility] has been arranged. Read by: Jeananne Rama, PA-C Electronically Signed   By: Judie Petit.  Shick M.D.   On: 01/23/2022 12:11   CT ABDOMEN PELVIS W CONTRAST  Result Date:  01/22/2022 CLINICAL DATA:  Abdominal pain, acute, nonlocalized. Left flank pain. History of pancreatic pseudocyst. EXAM: CT ABDOMEN AND PELVIS WITH CONTRAST TECHNIQUE: Multidetector CT imaging of the abdomen and pelvis was performed using the standard protocol following bolus administration of intravenous contrast. RADIATION DOSE REDUCTION: This exam was performed according to the departmental dose-optimization program which includes automated exposure control, adjustment of the mA and/or kV according to patient size and/or use of iterative reconstruction technique. CONTRAST:  OMNIPAQUE IOHEXOL 300 MG/ML  SOLN COMPARISON:  Abdominopelvic CT 01/10/2022 and 01/01/2022. FINDINGS: Lower chest: Similar atelectasis or scarring at both lung bases. No significant pleural or pericardial effusion. There is aortic and coronary artery atherosclerosis.  Hepatobiliary: Contour irregularity of the liver and relative enlargement of the caudate and left lobes again noted, suspicious for cirrhosis. No focal liver lesions are identified. The gallbladder is surgically absent. There are chronic small calcified stones within the cystic duct remnant. Stable mild extrahepatic biliary dilatation without definite signs of choledocholithiasis. No intrahepatic biliary dilatation. Pancreas: Chronic pancreatic atrophy without evidence of acute inflammation or pancreatic ductal dilatation. Peripancreatic collection adjacent to the pancreatic tail shows further decrease in size, now measuring 1.6 cm on image 33/2 (previously 2.2 cm). Spleen: Spleen size is stable at the upper limits of normal. Adrenals/Urinary Tract: Both adrenal glands appear normal. No evidence of urinary tract calculus, suspicious renal lesion or hydronephrosis. The bladder appears unremarkable for its degree of distention. Stomach/Bowel: No enteric contrast administered. The stomach appears unremarkable for its degree of distension. No evidence of bowel wall thickening,  distention or surrounding inflammatory change. Scattered colonic diverticulosis. Vascular/Lymphatic: There are no enlarged abdominal or pelvic lymph nodes. No acute vascular findings are identified. Mild aortic and branch vessel atherosclerosis without aneurysm. There are multiple chronic portosystemic venous collaterals in the left upper quadrant of the abdomen. Reproductive: Small left fundal uterine fibroid.  No adnexal mass. Other: A large amount of ascites is again noted which is similar in volume to the most recent prior studies. A large peripherally enhancing complex fluid collection is again noted within the left posterior pararenal space, with posterior extension through the posterior left abdominal wall. This has irregular margins and is difficult to measure, but shows further decrease in size compared with the most recent study. It currently measures approximately 7.5 x 3.9 cm transverse on image 44/2 (previously 7.3 x 5.5 cm). On the coronal images, it measures up to 15.2 cm on image 110/4 (previously 14.8 cm). This may extend to the skin surface, and there is increased surrounding inflammation in the subcutaneous fat without soft tissue emphysema. No new or enlarging fluid collections are identified. Musculoskeletal: No acute or significant osseous findings. Mild spondylosis. No osseous destruction identified. IMPRESSION: 1. Interval slight decrease in size of complex peripherally enhancing fluid collection extending from the left posterior pararenal space through the abdominal wall into the subcutaneous fat. This has been previously aspirated under ultrasound and is felt to reflect a pseudocyst. This may extend to the skin surface, and there appears to be increased surrounding subcutaneous edema which could reflect superimposed cellulitis. 2. No new or enlarging fluid collections are identified. There is a large amount of ascites which appears unchanged. 3. No acute abdominopelvic findings. 4. Hepatic  cirrhosis with portal hypertension manifesting as multiple portosystemic collaterals. Electronically Signed   By: Carey Bullocks M.D.   On: 01/22/2022 13:23   DG Chest Port 1 View  Result Date: 01/22/2022 CLINICAL DATA:  Questionable sepsis.  Evaluate for abnormality. EXAM: PORTABLE CHEST 1 VIEW COMPARISON:  AP chest 08/07/2021 FINDINGS: Cardiac silhouette and mediastinal contours within normal limits. Moderate right mild left decreased lung volumes, mildly worsened from prior. Right basilar horizontal linear likely subsegmental atelectasis. No definite pleural effusion. No pneumothorax is seen. No acute skeletal abnormality. IMPRESSION: Low lung volumes with right basilar horizontal linear likely subsegmental atelectasis. It is difficult to entirely exclude underlying pneumonia, however that is felt less likely. Electronically Signed   By: Neita Garnet M.D.   On: 01/22/2022 12:54    Scheduled Meds:  aspirin EC  81 mg Oral BID   insulin aspart  0-5 Units Subcutaneous QHS   insulin aspart  0-9 Units Subcutaneous  TID WC   pantoprazole  40 mg Oral BID   Continuous Infusions:  sodium chloride 100 mL/hr at 01/23/22 0338   ampicillin-sulbactam (UNASYN) IV 3 g (01/23/22 1130)   vancomycin       LOS: 1 day    Time spent: 50 mins    Cardin Nitschke, MD [facility]   If 7PM-7AM, please contact night-coverage

## 2022-01-23 NOTE — Progress Notes (Signed)
Notified on call provider about patient's BP of 89/64 (MAP 73) and HR 56. On call provider put in a new order for a 500 mL 0.9% NaCl bolus.

## 2022-01-23 NOTE — Procedures (Signed)
Interventional Radiology Procedure:   Indications: Left posterior abdominal fluid collection, fluid leaking from site.  Procedure: US guided aspiration of left posterior abdominal wall collection  Findings: Thick bloody purulent fluid aspirated with Yueh catheter, 15 ml.  Thin yellow fluid draining from the small pustules on skin.   Complications: No immediate complications noted.     EBL: Minimal  Plan: Fluid sent for analysis   Dejanira Pamintuan R. Anselm Pancoast, MD  Pager: [phone]

## 2022-01-23 NOTE — Inpatient Diabetes Management (Addendum)
Inpatient Diabetes Program Recommendations  AACE/ADA: New Consensus Statement on Inpatient Glycemic Control (2015)  Target Ranges:  Prepandial:   less than 140 mg/dL      Peak postprandial:   less than 180 mg/dL (1-2 hours)      Critically ill patients:  140 - 180 mg/dL   Lab Results  Component Value Date   GLUCAP 193 (H) 01/23/2022   HGBA1C 6.0 (A) 01/17/2022    Review of Glycemic Control  Latest Reference Range & Units 01/22/22 20:12 01/23/22 00:10 01/23/22 04:11 01/23/22 07:25  Glucose-Capillary 70 - 99 mg/dL 170 (H) 259 (H) 220 (H) 193 (H)  (H): Data is abnormally high  Diabetes history:  DM2  Outpatient Diabetes medications:  Basaglar 30 units QD Humalog 2-10 units TID  Current orders for Inpatient glycemic control:  Novolog 0-9 units TID and 0-5 units QHS Received decadron 10 mg on 9/18  Inpatient Diabetes Program Recommendations:    Semglee 15 units QHS (50% of home dose).  Addendum@11 :50: Spoke with patient at bedside.  She confirms above home medications.  She was recently started on rapid insulin by PCP in June.  Since then her A1C has decreased from 12.1% to 6%.  Congratulated her on her progress.    Will continue to follow while inpatient.  Thank you, Reche Dixon, MSN, [place] [facility] Coordinator Inpatient Diabetes Program [phone] (team pager from 8a-5p)

## 2022-01-23 NOTE — Progress Notes (Signed)
Gastroenterology Inpatient Follow-up Note   PATIENT IDENTIFICATION  Mallory Stewart is a 61 y.o. female with a pmh significant for diabetes, hypertension, cirrhosis (complicated by portal high pretension associated with ascites and EV's and GV's), history of chronic alcoholic pancreatitis, with recent peripancreatic/retroperitoneal infected pseudocyst/abscess status post aspiration now admitted to the hospital with cellulitis around the area of previous aspiration site.  Hospital Day: 2  SUBJECTIVE  The patient's chart was reviewed. Today, the patient is evaluated this late afternoon. Multiple discussions occurred over the course of today with interventional radiology as well as infectious disease as well as surgical colleagues. Our interventional radiology colleagues were able to do an ascites tap as well as a repeat aspiration of the retroperitoneal abscess/phlegmon. Patient still has backdated discomfort but significantly improved after aspiration repeated attempts. No fevers or chills.   OBJECTIVE  Scheduled Inpatient Medications:   aspirin EC  81 mg Oral BID   insulin aspart  0-5 Units Subcutaneous QHS   insulin aspart  0-9 Units Subcutaneous TID WC   pantoprazole  40 mg Oral BID   Continuous Inpatient Infusions:   sodium chloride 100 mL/hr at 01/23/22 0338   ampicillin-sulbactam (UNASYN) IV 3 g (01/23/22 1130)   vancomycin 1,500 mg (01/23/22 1330)   PRN Inpatient Medications: diphenhydrAMINE, HYDROmorphone (DILAUDID) injection, prochlorperazine   Physical Examination  Temp:  [95.2 F (35.1 C)-99.7 F (37.6 C)] 98 F (36.7 C) (09/19 1402) Pulse Rate:  [56-136] 86 (09/19 1402) Resp:  [12-31] 18 (09/19 1402) BP: (89-125)/(63-85) 111/75 (09/19 1402) SpO2:  [98 %-100 %] 100 % (09/19 1402) Weight:  [70.4 kg] 70.4 kg (09/18 1725) Temp (24hrs), Avg:97.5 F (36.4 C), Min:95.2 F (35.1 C), Max:99.7 F (37.6 C)  Weight: 70.4 kg GEN: NAD, appears stated age, doesn't  appear chronically ill PSYCH: Cooperative, without pressured speech EYE: Conjunctivae pink, sclerae anicteric ENT: MMM CV: Nontachycardic RESP: No wheezing GI: NABS, soft, NT/ND, without rebound or guarding MSK/EXT: Left mid back cellulitic region noted and less bulky as compared to yesterday now that she is status post aspiration ([employer] airplanes currently) SKIN: No jaundice NEURO:  Alert & Oriented x 3, no focal deficits   Review of Data   Laboratory Studies   Recent Labs  Lab 01/23/22 0508  NA 130*  K 4.2  CL 98  CO2 23  BUN 12  CREATININE 1.04*  GLUCOSE 218*  CALCIUM 8.2*   Recent Labs  Lab 01/23/22 0508  AST 16  ALT 7  ALKPHOS 94    Recent Labs  Lab 01/22/22 1217 01/23/22 0508  WBC 15.7* 10.1  HGB 10.0* 9.6*  HCT 30.7* 29.7*  PLT 261 217   Recent Labs  Lab 01/22/22 1217 01/23/22 0508  APTT 32  --   INR 1.5* 1.8*    Imaging Studies  US Paracentesis  Result Date: 01/23/2022 INDICATION: Patient with history of pancreatic pseudocyst, chronic pancreatitis, alcoholic cirrhosis, recurrent ascites. Request received for diagnostic and therapeutic paracentesis up to 3 liters. EXAM: ULTRASOUND GUIDED DIAGNOSTIC AND THERAPEUTIC PARACENTESIS MEDICATIONS: 10 ml 1% lidocaine COMPLICATIONS: None immediate. PROCEDURE: Informed written consent was obtained from the patient after a discussion of the risks, benefits and alternatives to treatment. A timeout was performed prior to the initiation of the procedure. Initial ultrasound scanning demonstrates a moderate amount of ascites within the right lower abdominal quadrant. The right lower abdomen was prepped and draped in the usual sterile fashion. 1% lidocaine was used for local anesthesia. Following this, a 19 gauge, 10-cm, Teressa Lower  catheter was introduced. An ultrasound image was saved for documentation purposes. The paracentesis was performed. The catheter was removed and a dressing was applied. The patient tolerated the  procedure well without immediate post procedural complication. FINDINGS: A total of approximately 2.9 liters of yellow fluid was removed. Samples were sent to the laboratory as requested by the clinical team. IMPRESSION: Successful ultrasound-guided diagnostic and therapeutic paracentesis yielding 2.9 liters of peritoneal fluid. PLAN: The patient has required >/=2 paracenteses in a 30 day period and a formal evaluation by the [facility] has been arranged. Read by: Rowe Robert, PA-C Electronically Signed   By: Jerilynn Mages.  Shick M.D.   On: 01/23/2022 12:11   CT ABDOMEN PELVIS W CONTRAST  Result Date: 01/22/2022 CLINICAL DATA:  Abdominal pain, acute, nonlocalized. Left flank pain. History of pancreatic pseudocyst. EXAM: CT ABDOMEN AND PELVIS WITH CONTRAST TECHNIQUE: Multidetector CT imaging of the abdomen and pelvis was performed using the standard protocol following bolus administration of intravenous contrast. RADIATION DOSE REDUCTION: This exam was performed according to the departmental dose-optimization program which includes automated exposure control, adjustment of the mA and/or kV according to patient size and/or use of iterative reconstruction technique. CONTRAST:  158mL OMNIPAQUE IOHEXOL 300 MG/ML  SOLN COMPARISON:  Abdominopelvic CT 01/10/2022 and 01/01/2022. FINDINGS: Lower chest: Similar atelectasis or scarring at both lung bases. No significant pleural or pericardial effusion. There is aortic and coronary artery atherosclerosis. Hepatobiliary: Contour irregularity of the liver and relative enlargement of the caudate and left lobes again noted, suspicious for cirrhosis. No focal liver lesions are identified. The gallbladder is surgically absent. There are chronic small calcified stones within the cystic duct remnant. Stable mild extrahepatic biliary dilatation without definite signs of choledocholithiasis. No intrahepatic biliary dilatation. Pancreas:  Chronic pancreatic atrophy without evidence of acute inflammation or pancreatic ductal dilatation. Peripancreatic collection adjacent to the pancreatic tail shows further decrease in size, now measuring 1.6 cm on image 33/2 (previously 2.2 cm). Spleen: Spleen size is stable at the upper limits of normal. Adrenals/Urinary Tract: Both adrenal glands appear normal. No evidence of urinary tract calculus, suspicious renal lesion or hydronephrosis. The bladder appears unremarkable for its degree of distention. Stomach/Bowel: No enteric contrast administered. The stomach appears unremarkable for its degree of distension. No evidence of bowel wall thickening, distention or surrounding inflammatory change. Scattered colonic diverticulosis. Vascular/Lymphatic: There are no enlarged abdominal or pelvic lymph nodes. No acute vascular findings are identified. Mild aortic and branch vessel atherosclerosis without aneurysm. There are multiple chronic portosystemic venous collaterals in the left upper quadrant of the abdomen. Reproductive: Small left fundal uterine fibroid.  No adnexal mass. Other: A large amount of ascites is again noted which is similar in volume to the most recent prior studies. A large peripherally enhancing complex fluid collection is again noted within the left posterior pararenal space, with posterior extension through the posterior left abdominal wall. This has irregular margins and is difficult to measure, but shows further decrease in size compared with the most recent study. It currently measures approximately 7.5 x 3.9 cm transverse on image 44/2 (previously 7.3 x 5.5 cm). On the coronal images, it measures up to 15.2 cm on image 110/4 (previously 14.8 cm). This may extend to the skin surface, and there is increased surrounding inflammation in the subcutaneous fat without soft tissue emphysema. No new or enlarging fluid collections are identified. Musculoskeletal: No acute or significant osseous  findings. Mild spondylosis. No osseous destruction identified. IMPRESSION: 1. Interval slight decrease  in size of complex peripherally enhancing fluid collection extending from the left posterior pararenal space through the abdominal wall into the subcutaneous fat. This has been previously aspirated under ultrasound and is felt to reflect a pseudocyst. This may extend to the skin surface, and there appears to be increased surrounding subcutaneous edema which could reflect superimposed cellulitis. 2. No new or enlarging fluid collections are identified. There is a large amount of ascites which appears unchanged. 3. No acute abdominopelvic findings. 4. Hepatic cirrhosis with portal hypertension manifesting as multiple portosystemic collaterals. Electronically Signed   By: Richardean Sale M.D.   On: 01/22/2022 13:23   DG Chest Port 1 View  Result Date: 01/22/2022 CLINICAL DATA:  Questionable sepsis.  Evaluate for abnormality. EXAM: PORTABLE CHEST 1 VIEW COMPARISON:  AP chest 08/07/2021 FINDINGS: Cardiac silhouette and mediastinal contours within normal limits. Moderate right mild left decreased lung volumes, mildly worsened from prior. Right basilar horizontal linear likely subsegmental atelectasis. No definite pleural effusion. No pneumothorax is seen. No acute skeletal abnormality. IMPRESSION: Low lung volumes with right basilar horizontal linear likely subsegmental atelectasis. It is difficult to entirely exclude underlying pneumonia, however that is felt less likely. Electronically Signed   By: Yvonne Kendall M.D.   On: 01/22/2022 12:54    GI Procedures and Studies  No new relevant studies to review   ASSESSMENT  Ms. Lemelle is a 61 y.o. female with a pmh significant for diabetes, hypertension, cirrhosis (complicated by portal high pretension associated with ascites and EV's and GV's), history of chronic alcoholic pancreatitis, with recent peripancreatic/retroperitoneal infected pseudocyst/abscess status  post aspiration now admitted to the hospital with cellulitis around the area of previous aspiration site.   The patient is hemodynamically stable.  Clinically, after repeat aspiration she seems to be doing better as well.  We will monitor the patient with antibiotic therapy is evaluate the culture data to see if anything else grows or if antibiotics will need to be considered/adjusted.  Otherwise I would monitor.  Severe consider early follow-up cross-sectional imaging to see if the retroperitoneal fluid collection/previous abscess has decreased in size or enlarged.  If significant enlargement has occurred, then the consideration of pancreatic ductal distal leak should be considered.  To further evaluate this, consideration of an ERCP will need to be had though the risks in the setting of pancreatitic duct disease is increased compared to typical ERCPs.  Let us see how the patient does over the course of the next few days.  Appreciate our colleagues with this patient's case Holding further diuretic management due to patient's electrolytes but with the amount of ascites taps that are occurring, she may benefit from her a portal hypertension clinic interventional radiology evaluation but that will be on hold for now. All patient questions were answered to the best of my ability, and the patient agrees to the aforementioned plan of action with follow-up as indicated.   PLAN/RECOMMENDATIONS  Follow-up ascites tap fluid cell counts Follow-up retroperitoneal fluid collection/phlegmon labs - Fluid cell count, lipase, amylase, fluid culture Consider MRI/MRCP in next week to better define distal duct disruption possibility to consider increased risk ERCP Appreciate ID and medicine service with evaluation/treatment.   Please page/call with questions or concerns.   Justice Britain, MD [facility] # [phone]    LOS: 1 day  Irving Copas  01/23/2022, 2:05  PM

## 2022-01-23 NOTE — Consult Note (Signed)
[facility]                                                                                        Patient Identification: Patient Name: Mallory Stewart MRN: [id number] [place] Date: 01/22/2022 10:46 AM Today's Date: 01/23/2022 Reason for consult: Cellulitis, draining left flank Requesting provider: Su Grand  Principal Problem:   Sepsis [facility]) Active Problems:   HTN (hypertension)   Chronic hyponatremia   Type 2 diabetes mellitus with hyperglycemia, with long-term current use of insulin ([place])   Pancreatic pseudocyst   Ascites   Cellulitis   Allergic reaction   Antibiotics:  Vancomycin 9/18- Ceftriaxone 9/18, Unasyn 9/18-  Lines/Hardware: PIV  Assessment 61 year old female with PMH of alcoholic liver cirrhosis, alcohol induced chronic pancreatitis with pseudocyst, DDD, DM type II, HTN, neuropathy, left fifth metatarsal fracture status post ORIF, status post left IM tibial nail insertion, C diff in July 2023  who presented to the ED on 9/18 with left lower back redness, swelling and redness/drainage for 1 day with chills. CT abdomen/pelvis 9/18 findings noted  # Infected Pseudocyst with drainage at the left flank with associated cellulitis   # Alcoholic Liver Cirrhosis with portal HTN/Ascites/Varices and splenomegaly  - Follows Dr Carlean Purl and Liver care  - No signs of SBP on exam. S/p paracentesis with only 234 WBCs  # C diff in [id number] - treated with 10 days of Fidaxomicin   Recommendations  Continue Vancomycin, pharmacy to dose and Unasyn Multispecialty discussion with GI, general surgery and IR plans for Guided Aspiration of Left Posterior Abdominal Collection.  and sample for aerobic/anaerobic cultures as appropriate Will start on PO Vancomycin prophylactic dosing given recent C diff and need for broad spectrum abx Following  Rest of the management as per the primary  team. Please call with questions or concerns.  Thank you for the consult  Rosiland Oz, MD Infectious Disease Physician [facility] [address] [place], [place] [address] Phone: [phone]  Fax: [phone]  __________________________________________________________________________________________________________ HPI and Hospital Course: 61 year old female with PMH of alcoholic liver cirrhosis, alcohol induced chronic pancreatitis with pseudocyst, DDD, DM type II, HTN, neuropathy, Left fifth metatarsal fracture status post ORIF, status post left IM tibial nail insertion , C diff in 11/2021 who presented to the ED on 9/18 with left lower back redness, swelling and redness for 1 day with chills.  Denies any fevers.  Denies any nausea, vomiting, abdominal pain or distention or diarrhea.  Denies any cough, chest pain or shortness of breath.  Denies any GU symptoms. `  Admitted on 8/20-8/29 for left flank pain and swelling.  CT findings findings on 8/20, 8/28 and 9/6 noted. She underwent paracentesis for ascites and aspiration with IR 8/25 with cx growing Kleb pneumo.  Patient considered a nonsurgical candidate per general surgery due to cirrhosis and portal hypertension. Seen by ID and she was discharged on oral cefadroxil and metronidazole which she completed as instructed.  Admitted in the hospital on 9/6 for worsening abdominal pain, distention and flank pain in the setting of reducing her diuretics due to  symptomatic hyponatremia.  No fever and normal WBC count. CT showed stable size of her pseudocyst.  She underwent paracentesis of 6 L, felt symptomatically better after paracentesis, was given 6 g/dL albumin, diuretics readjusted and was discharged  At ED, she is afebrile, WBC 10.1 Labs remarkable for NA 126, lactic acid 2.0 WBC 15.7 9/18 blood culture both sets no growth in less than 24 hours  Imaging as below   ROS: all systems  reviewed with pertinent positives and negatives as listed above  Past Medical History:  Diagnosis Date   Alcohol-induced chronic pancreatitis ([place])    Alcoholic cirrhosis ([place])    B12 deficiency    Concussion    DDD (degenerative disc disease), cervical    Diabetes ([place])    DKA, type 2 ([place])    Gastric outlet obstruction 08/12/2018   Hypertension    Neuropathy    Pancreatic pseudocyst/cyst 05/06/2013   Seasonal allergies    Past Surgical History:  Procedure Laterality Date   ANKLE ARTHROSCOPY Right 12/20/2020   Procedure: RIGHT ANKLE ARTHROSCOPIC DEBRIDEMENT;  Surgeon: Newt Minion, MD;  Location: [place];  Service: Orthopedics;  Laterality: Right;   BIOPSY  01/19/2019   Procedure: BIOPSY;  Surgeon: Rush Landmark Telford Nab., MD;  Location: [place];  Service: Gastroenterology;;   CHOLECYSTECTOMY N/A 07/28/2016   Procedure: LAPAROSCOPIC CHOLECYSTECTOMY WITH INTRAOPERATIVE CHOLANGIOGRAM;  Surgeon: Mickeal Skinner, MD;  Location: [place];  Service: General;  Laterality: N/A;   ESOPHAGOGASTRODUODENOSCOPY (EGD) WITH PROPOFOL N/A 08/13/2018   Procedure: ESOPHAGOGASTRODUODENOSCOPY (EGD) WITH PROPOFOL;  Surgeon: Irving Copas., MD;  Location: [place];  Service: Gastroenterology;  Laterality: N/A;   ESOPHAGOGASTRODUODENOSCOPY (EGD) WITH PROPOFOL N/A 01/19/2019   Procedure: ESOPHAGOGASTRODUODENOSCOPY (EGD) WITH PROPOFOL;  Surgeon: Rush Landmark Telford Nab., MD;  Location: [place];  Service: Gastroenterology;  Laterality: N/A;   ESOPHAGOGASTRODUODENOSCOPY (EGD) WITH PROPOFOL N/A 12/28/2021   Procedure: ESOPHAGOGASTRODUODENOSCOPY (EGD) WITH PROPOFOL;  Surgeon: Rush Landmark Telford Nab., MD;  [facility];  Service: Gastroenterology;  Laterality: N/A;   EUS N/A 12/28/2021   Procedure: UPPER ENDOSCOPIC ULTRASOUND (EUS) LINEAR;  Surgeon: Irving Copas., MD;  [facility];  Service: Gastroenterology;  Laterality: N/A;   IR PARACENTESIS   08/08/2020   IR PARACENTESIS  03/13/2021   LEEP  1990's   ORIF FOOT FRACTURE  09/2008   L 5th metatarsal   REFRACTIVE SURGERY  2000   TIBIA IM NAIL INSERTION Left 03/12/2021   Procedure: INTRAMEDULLARY (IM) NAIL TIBIAL;  Surgeon: Jessy Oto, MD;  Location: [place];  Service: Orthopedics;  Laterality: Left;   TONSILLECTOMY     UPPER ESOPHAGEAL ENDOSCOPIC ULTRASOUND (EUS) N/A 08/13/2018   Procedure: UPPER ESOPHAGEAL ENDOSCOPIC ULTRASOUND (EUS);  Surgeon: Irving Copas., MD;  Location: [place];  Service: Gastroenterology;  Laterality: N/A;   UPPER ESOPHAGEAL ENDOSCOPIC ULTRASOUND (EUS) N/A 01/19/2019   Procedure: UPPER ESOPHAGEAL ENDOSCOPIC ULTRASOUND (EUS);  Surgeon: Irving Copas., MD;  Location: [place];  Service: Gastroenterology;  Laterality: N/A;     Scheduled Meds:  aspirin EC  81 mg Oral BID   insulin aspart  0-5 Units Subcutaneous QHS   insulin aspart  0-9 Units Subcutaneous TID WC   pantoprazole  40 mg Oral BID   Continuous Infusions:  sodium chloride 100 mL/hr at 01/23/22 0338   ampicillin-sulbactam (UNASYN) IV 3 g (01/23/22 1130)   vancomycin 1,500 mg (01/23/22 1330)   PRN Meds:.diphenhydrAMINE, HYDROmorphone (DILAUDID) injection, prochlorperazine  Allergies  Allergen Reactions   Ceftriaxone Hives  Immediate bright red itchy rash on bilateral legs after starting infusion.     Gluten Meal Other (See Comments)    Stomach distress   Zocor [Simvastatin] Other (See Comments)    Elevated lfts?   Social History   Socioeconomic History   Marital status: Married    Spouse name: Not on file   Number of children: 1   Years of education: 16   Highest education level: Not on file  Occupational History   Occupation: Mudlogger of business development  Tobacco Use   Smoking status: Never   Smokeless tobacco: Never  Vaping Use   Vaping Use: Never used  Substance and Sexual Activity   Alcohol use: Not Currently    Alcohol/week: 7.0 standard drinks  of alcohol    Types: 7 Glasses of wine per week    Comment: wine   Drug use: No   Sexual activity: Not on file  Other Topics Concern   Not on file  Social History Narrative   Patient is married one stepson    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 not traveling   regular exercise , walking 4-5 times a week   Diet fruits and veggies, water   Alcohol at least several glasses of wine a week Stopped 08/2018   Never smoker, no drug use   Right handed   One story home   [place] in the am   Social Determinants of Health   Financial Resource Strain: Not on file  Food Insecurity: No Food Insecurity (01/22/2022)   Hunger Vital Sign    Worried About Running Out of Food in the Last Year: Never true    [place] in the Last Year: Never true  Transportation Needs: No Transportation Needs (01/22/2022)   PRAPARE - Hydrologist (Medical): No    Lack of Transportation (Non-Medical): No  Physical Activity: Not on file  Stress: Not on file  Social Connections: Not on file  Intimate Partner Violence: Not At Risk (01/22/2022)   Humiliation, Afraid, Rape, and Kick questionnaire    Fear of Current or Ex-Partner: No    Emotionally Abused: No    Physically Abused: No    Sexually Abused: No   Family History  Problem Relation Age of Onset   Other Mother        tachycardia.Marland KitchenMarland Kitchen?afib   Stroke Father        after hernia suegery   Prostate cancer Father    Other Father        global transient amnesia, unclear source   Atrial fibrillation Sister    Healthy Brother    Healthy Brother    Coronary artery disease Paternal Grandmother    Heart attack Paternal Grandmother 85   Brain cancer Maternal Grandfather        ?   Cancer Paternal Grandfather        ?   Breast cancer Maternal Grandmother      Vitals BP 111/75 (BP Location: Left Arm)   Pulse 86   Temp 98 F (36.7 C) (Oral)   Resp 18   Ht 5'  9" (1.753 m)   Wt 70.4 kg   SpO2 100%   BMI 22.92 kg/m    Physical Exam Constitutional: Sitting up in the bed, family member at the bedside    Comments:   Cardiovascular:     Rate and Rhythm: Normal rate and  regular rhythm.     Heart sounds: Normal heart sounds   Pulmonary:     Effort: Pulmonary effort is normal on room air     Comments: Normal breath sounds  Abdominal:     Palpations: Abdomen is soft.     Tenderness: Non distended and nontender (seen post paracentesis)  Musculoskeletal:        General: No swelling or tenderness in extremities   Skin:    Comments:  Left flank with redness, warmth but no fluctuance or crepitus with whitish drainage soaking the bandage    Neurological:     General: Grossly non focal, awake, alert and oriented   Psychiatric:        Mood and Affect: Mood normal.    Pertinent Microbiology Results for orders placed or performed during the hospital encounter of 01/22/22  Blood Culture (routine x 2)     Status: None (Preliminary result)   Collection Time: 01/22/22 12:17 PM   Specimen: BLOOD  Result Value Ref Range Status   Specimen Description   Final    BLOOD RIGHT ANTECUBITAL Performed at [facility], [place] [address], [place], [place] [address]    Special Requests   Final    BOTTLES DRAWN AEROBIC AND ANAEROBIC Blood Culture adequate volume Performed at [facility] [address], [place], [place] [address]    Culture   Final    NO GROWTH < 24 HOURS Performed at [facility] [address], [place], [place] [address]    Report Status PENDING  Incomplete  Blood Culture (routine x 2)     Status: None (Preliminary result)   Collection Time: 01/22/22  5:06 PM   Specimen: BLOOD  Result Value Ref Range Status   Specimen Description   Final    BLOOD SITE NOT SPECIFIED Performed at [facility] [address] [place], [place] [address]    Special Requests   Final     BOTTLES DRAWN AEROBIC ONLY Blood Culture adequate volume Performed at [facility] [address] [place], [place] [address]    Culture   Final    NO GROWTH < 12 HOURS Performed at [facility] [address] [place], [place] [address]    Report Status PENDING  Incomplete     EUS 8/24 Findings: Normal mucosa was found in the esophagus. Small grade I varices were found in the distal esophagus. Type 1 isolated gastric varices (IGV1, varices located in the fundus) with no bleeding were found in the gastric fundus. There were no stigmata of recent bleeding. Moderate portal hypertensive gastropathy was found in the entire examined stomach. No gross lesions were noted in the duodenal bulb, in the first portion of the duodenum and in the second portion of the duodenum.  EGD 12/06/21 Findings: - The examined esophagus was normal. - Type 1 isolated gastric varices (IGV1, varices located in the fundus) with no bleeding were found in the cardia. There were no stigmata of recent bleeding. - Mild portal hypertensive gastropathy was found in the cardia and in the gastric fundus. - The exam was otherwise without abnormality. - The cardia and gastric fundus were normal on retroflexion.  Pertinent Lab seen by me:    Latest Ref Rng & Units 01/23/2022    5:08 AM 01/22/2022   12:17 PM 01/11/2022    7:21 AM  CBC  WBC 4.0 - 10.5 K/uL 10.1  15.7  8.5   Hemoglobin  12.0 - 15.0 g/dL 9.6  10.0  11.0   Hematocrit 36.0 - 46.0 % 29.7  30.7  35.8   Platelets 150 - 400 K/uL 217  261  260       Latest Ref Rng & Units 01/23/2022    5:08 AM 01/22/2022    4:00 PM 01/22/2022   12:17 PM  CMP  Glucose 70 - 99 mg/dL 218  110  174   BUN 6 - 20 mg/dL 12  10  10    Creatinine 0.44 - 1.00 mg/dL 1.04  0.91  0.97   Sodium 135 - 145 mmol/L 130  128  126   Potassium 3.5 - 5.1 mmol/L 4.2  3.7  3.7   Chloride 98 - 111 mmol/L 98  94  93   CO2 22 - 32 mmol/L 23  26  25    Calcium 8.9 - 10.3 mg/dL  8.2  8.5  8.4   Total Protein 6.5 - 8.1 g/dL 5.9   6.6   Total Bilirubin 0.3 - 1.2 mg/dL 0.9   1.3   Alkaline Phos 38 - 126 U/L 94   116   AST 15 - 41 U/L 16   20   ALT 0 - 44 U/L 7   9      Pertinent Imagings/Other Imagings Plain films and CT images have been personally visualized and interpreted; radiology reports have been reviewed. Decision making incorporated into the Impression / Recommendations.  US Paracentesis  Result Date: 01/23/2022 INDICATION: Patient with history of pancreatic pseudocyst, chronic pancreatitis, alcoholic cirrhosis, recurrent ascites. Request received for diagnostic and therapeutic paracentesis up to 3 liters. EXAM: ULTRASOUND GUIDED DIAGNOSTIC AND THERAPEUTIC PARACENTESIS MEDICATIONS: 10 ml 1% lidocaine COMPLICATIONS: None immediate. PROCEDURE: Informed written consent was obtained from the patient after a discussion of the risks, benefits and alternatives to treatment. A timeout was performed prior to the initiation of the procedure. Initial ultrasound scanning demonstrates a moderate amount of ascites within the right lower abdominal quadrant. The right lower abdomen was prepped and draped in the usual sterile fashion. 1% lidocaine was used for local anesthesia. Following this, a 19 gauge, 10-cm, Yueh catheter was introduced. An ultrasound image was saved for documentation purposes. The paracentesis was performed. The catheter was removed and a dressing was applied. The patient tolerated the procedure well without immediate post procedural complication. FINDINGS: A total of approximately 2.9 liters of yellow fluid was removed. Samples were sent to the laboratory as requested by the clinical team. IMPRESSION: Successful ultrasound-guided diagnostic and therapeutic paracentesis yielding 2.9 liters of peritoneal fluid. PLAN: The patient has required >/=2 paracenteses in a 30 day period and a formal evaluation by the [facility] has been arranged. Read by: Rowe Robert, PA-C Electronically Signed   By: Jerilynn Mages.  Shick M.D.   On: 01/23/2022 12:11   CT ABDOMEN PELVIS W CONTRAST  Result Date: 01/22/2022 CLINICAL DATA:  Abdominal pain, acute, nonlocalized. Left flank pain. History of pancreatic pseudocyst. EXAM: CT ABDOMEN AND PELVIS WITH CONTRAST TECHNIQUE: Multidetector CT imaging of the abdomen and pelvis was performed using the standard protocol following bolus administration of intravenous contrast. RADIATION DOSE REDUCTION: This exam was performed according to the departmental dose-optimization program which includes automated exposure control, adjustment of the mA and/or kV according to patient size and/or use of iterative reconstruction technique. CONTRAST:  138mL OMNIPAQUE IOHEXOL 300 MG/ML  SOLN COMPARISON:  Abdominopelvic CT 01/10/2022 and 01/01/2022. FINDINGS: Lower chest: Similar atelectasis or scarring at both lung  bases. No significant pleural or pericardial effusion. There is aortic and coronary artery atherosclerosis. Hepatobiliary: Contour irregularity of the liver and relative enlargement of the caudate and left lobes again noted, suspicious for cirrhosis. No focal liver lesions are identified. The gallbladder is surgically absent. There are chronic small calcified stones within the cystic duct remnant. Stable mild extrahepatic biliary dilatation without definite signs of choledocholithiasis. No intrahepatic biliary dilatation. Pancreas: Chronic pancreatic atrophy without evidence of acute inflammation or pancreatic ductal dilatation. Peripancreatic collection adjacent to the pancreatic tail shows further decrease in size, now measuring 1.6 cm on image 33/2 (previously 2.2 cm). Spleen: Spleen size is stable at the upper limits of normal. Adrenals/Urinary Tract: Both adrenal glands appear normal. No evidence of urinary tract calculus, suspicious renal lesion or hydronephrosis. The bladder appears unremarkable for its  degree of distention. Stomach/Bowel: No enteric contrast administered. The stomach appears unremarkable for its degree of distension. No evidence of bowel wall thickening, distention or surrounding inflammatory change. Scattered colonic diverticulosis. Vascular/Lymphatic: There are no enlarged abdominal or pelvic lymph nodes. No acute vascular findings are identified. Mild aortic and branch vessel atherosclerosis without aneurysm. There are multiple chronic portosystemic venous collaterals in the left upper quadrant of the abdomen. Reproductive: Small left fundal uterine fibroid.  No adnexal mass. Other: A large amount of ascites is again noted which is similar in volume to the most recent prior studies. A large peripherally enhancing complex fluid collection is again noted within the left posterior pararenal space, with posterior extension through the posterior left abdominal wall. This has irregular margins and is difficult to measure, but shows further decrease in size compared with the most recent study. It currently measures approximately 7.5 x 3.9 cm transverse on image 44/2 (previously 7.3 x 5.5 cm). On the coronal images, it measures up to 15.2 cm on image 110/4 (previously 14.8 cm). This may extend to the skin surface, and there is increased surrounding inflammation in the subcutaneous fat without soft tissue emphysema. No new or enlarging fluid collections are identified. Musculoskeletal: No acute or significant osseous findings. Mild spondylosis. No osseous destruction identified. IMPRESSION: 1. Interval slight decrease in size of complex peripherally enhancing fluid collection extending from the left posterior pararenal space through the abdominal wall into the subcutaneous fat. This has been previously aspirated under ultrasound and is felt to reflect a pseudocyst. This may extend to the skin surface, and there appears to be increased surrounding subcutaneous edema which could reflect superimposed  cellulitis. 2. No new or enlarging fluid collections are identified. There is a large amount of ascites which appears unchanged. 3. No acute abdominopelvic findings. 4. Hepatic cirrhosis with portal hypertension manifesting as multiple portosystemic collaterals. Electronically Signed   By: Richardean Sale M.D.   On: 01/22/2022 13:23   DG Chest Port 1 View  Result Date: 01/22/2022 CLINICAL DATA:  Questionable sepsis.  Evaluate for abnormality. EXAM: PORTABLE CHEST 1 VIEW COMPARISON:  AP chest 08/07/2021 FINDINGS: Cardiac silhouette and mediastinal contours within normal limits. Moderate right mild left decreased lung volumes, mildly worsened from prior. Right basilar horizontal linear likely subsegmental atelectasis. No definite pleural effusion. No pneumothorax is seen. No acute skeletal abnormality. IMPRESSION: Low lung volumes with right basilar horizontal linear likely subsegmental atelectasis. It is difficult to entirely exclude underlying pneumonia, however that is felt less likely. Electronically Signed   By: Yvonne Kendall M.D.   On: 01/22/2022 12:54   US Paracentesis  Result Date: 01/11/2022 INDICATION: Patient with history of pancreatic pseudocyst, chronic  pancreatitis, alcoholic cirrhosis, recurrent ascites; request received for diagnostic and therapeutic paracentesis. EXAM: ULTRASOUND GUIDED DIAGNOSTIC AND THERAPEUTIC PARACENTESIS MEDICATIONS: 8 mL 1% lidocaine COMPLICATIONS: None immediate. PROCEDURE: Informed written consent was obtained from the patient after a discussion of the risks, benefits and alternatives to treatment. A timeout was performed prior to the initiation of the procedure. Initial ultrasound scanning demonstrates a large amount of ascites within the right lower abdominal quadrant. The right lower abdomen was prepped and draped in the usual sterile fashion. 1% lidocaine was used for local anesthesia. Following this, a 19 gauge, 7-cm, Yueh catheter was introduced. An ultrasound  image was saved for documentation purposes. The paracentesis was performed. The catheter was removed and a dressing was applied. The patient tolerated the procedure well without immediate post procedural complication. FINDINGS: A total of approximately 5.9 liters of yellow fluid was removed. Samples were sent to the laboratory as requested by the clinical team. IMPRESSION: Successful ultrasound-guided diagnostic and therapeutic paracentesis yielding 5.9 liters of peritoneal fluid. PLAN: The patient has required >/=2 paracenteses in a 30 day period and a screening evaluation by the [facility] has been arranged. Read by: Rowe Robert, PA-C Electronically Signed   By: Markus Daft M.D.   On: 01/11/2022 10:52   CT ABDOMEN PELVIS W CONTRAST  Result Date: 01/10/2022 CLINICAL DATA:  Evaluate abdominal distension. Pancreatic pseudocyst. EXAM: CT ABDOMEN AND PELVIS WITH CONTRAST TECHNIQUE: Multidetector CT imaging of the abdomen and pelvis was performed using the standard protocol following bolus administration of intravenous contrast. RADIATION DOSE REDUCTION: This exam was performed according to the departmental dose-optimization program which includes automated exposure control, adjustment of the mA and/or kV according to patient size and/or use of iterative reconstruction technique. CONTRAST:  193mL OMNIPAQUE IOHEXOL 300 MG/ML  SOLN COMPARISON:  01/01/2022 FINDINGS: Lower chest: Mild subsegmental atelectasis noted within the lung bases. Hepatobiliary: The liver appears cirrhotic with hypertrophy of the lateral segment of left hepatic lobe and caudate lobe. Contour the liver is slightly nodular. No focal liver lesion identified. Previous cholecystectomy. Again noted are several tiny stones identified within the cystic duct remnant. Unchanged common bile duct dilatation which measures 1 cm in diameter. Pancreas: Changes of chronic pancreatitis with punctate  calcifications within the head of pancreas are again noted. No acute inflammatory changes identified within the pancreas. No pancreatic mass. Peripherally enhancing fluid collection associated with the tail of pancreas is again noted measuring 2.2 x 1.7 cm, image 36/2. This is compared with 3.5 by 2.0 cm previously. Large peripherally enhancing, multilocular fluid collection is seen within the left posterior pararenal space and extending into the left posterior abdominal wall. On today's study this measures 7.3 by 5.5 by 14.8 cm, image 44/2 and image 110/5. Previously this measured 7.3 x 5.7 by 14.6 cm. Spleen: Spleen is within normal limits measuring 12.7 cm cranial caudal. Adrenals/Urinary Tract: Normal adrenal glands. No kidney mass, nephrolithiasis, or hydronephrosis. Urinary bladder is unremarkable. Stomach/Bowel: Stomach appears nondistended. There is mild increase caliber of the transverse portion of the duodenum which measures up to 3.8 cm. The remaining small bowel loops are normal in caliber. No pathologic dilatation of the colon to suggest obstruction. Vascular/Lymphatic: The portal vein, portal venous confluence and SMV are patent. 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 Gastroesophageal varices are again noted. No signs of abdominopelvic adenopathy. Aortic atherosclerosis without aneurysm. Reproductive: Uterus and bilateral adnexa are unremarkable. Other: There is a large volume of ascites within  the abdomen and pelvis. When compared with the previous exam this is stable to mildly increased from the prior study. Musculoskeletal: No acute or significant osseous findings. IMPRESSION: 1. Similar size of peripherally enhancing large pseudocyst involving the left posterior pararenal space extending into the left posterior abdominal wall. Smaller pseudocyst within tail of pancreas is slightly decreased in size from previous exam. 2. Large volume of ascites within  the abdomen and pelvis. When compared with the previous exam this is stable to mildly increased from the prior study. 3. Cirrhosis with stigmata of portal venous hypertension. 4. Unchanged appearance of dilatation of the common bile duct which is likely secondary to stricture within the pancreatic head. 5. Changes of chronic pancreatitis identified. 6. 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 7. Aortic Atherosclerosis (ICD10-I70.0). Electronically Signed   By: Kerby Moors M.D.   On: 01/10/2022 13:34   CT ABDOMEN W WO CONTRAST  Result Date: 01/01/2022 CLINICAL DATA:  Worsening abdominal and back pain. Pancreatic pseudocysts and cirrhosis. EXAM: CT ABDOMEN WITHOUT AND WITH CONTRAST TECHNIQUE: Multidetector CT imaging of the abdomen was performed following the standard protocol before and following the bolus administration of intravenous contrast. RADIATION DOSE REDUCTION: This exam was performed according to the departmental dose-optimization program which includes automated exposure control, adjustment of the mA and/or kV according to patient size and/or use of iterative reconstruction technique. CONTRAST:  128mL OMNIPAQUE IOHEXOL 300 MG/ML  SOLN COMPARISON:  12/29/2021 FINDINGS: Lower chest: No acute findings. Hepatobiliary: Hepatic cirrhosis is again demonstrated. No evidence of hepatic mass. Recanalization of paraumbilical veins is consistent with portal venous hypertension. Prior cholecystectomy noted. A few tiny less than 5 mm calculi are again seen in the cystic duct remnant. Stable dilatation of the common bile duct measuring 1.6 cm, with probable stricture of the distal common bile duct within the pancreatic head. Pancreas: Scattered tiny punctate calcifications are seen in the pancreatic head, suspicious for chronic pancreatitis. No evidence acute peripancreatic inflammatory changes. No evidence of pancreatic mass. Rim enhancing fluid collection is again seen  involving the pancreatic tail measuring 3.5 x 2.0 cm, consistent with a small pseudocyst. A large multilocular rim enhancing fluid collection is again seen in the left posterior pararenal space. This measures 14 x 7 by 7.3 x 5.7 cm, without significant change, consistent with pancreatic pseudocysts. Moderate abdominal ascites shows no significant change. Spleen:  Within normal limits in size and appearance. Adrenals/Urinary Tract: No suspicious masses identified. No evidence of hydronephrosis. Stomach/Bowel: Unremarkable. Vascular/Lymphatic: No pathologically enlarged lymph nodes identified. No acute vascular findings. Chronic thrombosis of the proximal vein is seen with reconstitution of the distal splenic vein. Mild gastroesophageal varices are again seen, consistent with portal venous hypertension. Other:  None. Musculoskeletal:  No suspicious bone lesions identified. IMPRESSION: Stable pancreatic pseudocysts involving the pancreatic tail and in the left posterior pararenal space. Moderate ascites, without significant change. Stable hepatic cirrhosis and findings of portal venous hypertension. No evidence of hepatic neoplasm. Stable dilatation of common bile duct, with probable distal stricture within the pancreatic head. Chronic pancreatitis is suspected, and no pancreatic mass identified. Recommend correlation with liver function tests, and consider abdomen MRI and MRCP without and with contrast for further evaluation if clinically warranted. Electronically Signed   By: Marlaine Hind M.D.   On: 01/01/2022 15:27   [place] FINE NEEDLE ASP 1ST LESION  Result Date: 12/29/2021 INDICATION: Left flank pain. Enlarging left upper quadrant retroperitoneal pancreatic pseudocyst, possibly involving body wall EXAM: ULTRASOUND ASPIRATION MEDICATIONS:  No periprocedural antibiotics were indicated ANESTHESIA/SEDATION: Lidocaine 1% subcutaneous COMPLICATIONS: None immediate. PROCEDURE: Informed written consent was obtained from  the patient after a thorough discussion of the procedural risks, benefits and alternatives. All questions were addressed. Maximal Sterile Barrier Technique was utilized including caps, mask, sterile gowns, sterile gloves, sterile drape, hand hygiene and skin antiseptic. A timeout was performed prior to the initiation of the procedure. Ultrasound of the left upper quadrant retroperitoneum was performed and the complex collection was localized corresponding to CT findings. An appropriate skin entry site was determined and marked. The skin prepped with chlorhexidine, draped in usual sterile fashion, infiltrated locally with 1% lidocaine. 10 cm 5 [place] multi sidehole Yueh sheath needle advanced into the collection. 330 mL opaque tan fluid were aspirated, sample sent for the requested laboratory studies. Postprocedure scans show significant decompression of the complex collection with no significant large residual component evident on ultrasound. The patient tolerated the procedure well. IMPRESSION: 1. Technically successful ultrasound-guided aspiration of left upper quadrant retroperitoneal collection, removing 330 mL opaque tan fluid, sent for requested laboratory studies. Electronically Signed   By: Lucrezia Europe M.D.   On: 12/29/2021 17:09   CT ABDOMEN PELVIS W CONTRAST  Result Date: 12/29/2021 CLINICAL DATA:  pancreatic pseudocyst, follow-up study EXAM: CT ABDOMEN AND PELVIS WITH CONTRAST TECHNIQUE: Multidetector CT imaging of the abdomen and pelvis was performed using the standard protocol following bolus administration of intravenous contrast. RADIATION DOSE REDUCTION: This exam was performed according to the departmental dose-optimization program which includes automated exposure control, adjustment of the mA and/or kV according to patient size and/or use of iterative reconstruction technique. CONTRAST:  15mL OMNIPAQUE IOHEXOL 300 MG/ML  SOLN COMPARISON:  December 24, 2021 FINDINGS: Lower chest: Previously seen  small left pleural effusion and left basilar atelectasis has significantly improved in the interim. Again seen are the extensive coronary artery atheromatous calcifications. Hepatobiliary: Hepatic surface is nodular and lobulated and these morphological changes in keeping with cirrhosis. No discrete mass lesion is identified. Status post cholecystectomy. No intrahepatic biliary dilatation. Again seen is the severe dilatation of the CBD measuring approximately 1.7 cm, tapering within the pancreatic head likely related to changes of chronic pancreatitis and reservoir effect of cholecystectomy. Pancreas: Moderate pancreatic parenchymal atrophy. Intraparenchymal loculated fluid collection at the distal body and tail of the pancreas in keeping with a pancreatic pseudocyst measuring approximately 2.5 x 4 cm in comparison to 2.6 x 4.2 cm on the previous study. This fluid collection is in continuity with the fluid collection in the left posterior pararenal space which represent retroperitoneal/posterior parietal wall extension of the pancreatic pseudocyst. This rim enhancing septated collection measures approximately 9.1 x 7.5 cm in comparison to 8.8 x 8 cm on the previous study. The fluid pocket extending into the posterior parietal wall measures 9.2 x 3.7 cm (image 48/2) in comparison to 7 x 2.5 cm on the previous study and has increased. Spleen: Mild splenomegaly. Adrenals/Urinary Tract: Adrenal glands are unremarkable. Kidneys are normal, without renal calculi, focal lesion, or hydronephrosis except for the anterior and medial displacement of the left kidney of pseudocyst of the pancreas, stable. Bladder is unremarkable. Stomach/Bowel: Stomach is within normal limits. Appendix appears normal. No evidence of bowel wall thickening, distention, or inflammatory changes. Moderately large stool burden in the colon. Vascular/Lymphatic: Aortic atherosclerosis. No enlarged abdominal or pelvic lymph nodes. Reproductive: Uterus  and bilateral adnexa are unremarkable. Other: There is a moderately large abdominopelvic ascites seen without significant interval change. Musculoskeletal: Mild lumbar spondylosis.  No fracture seen. IMPRESSION: 1. Again seen are the findings of cirrhosis of the liver and portal hypertension with small gastric varices, moderately large ascites and mild splenomegaly without significant interval change. 2. The pseudocyst at the distal body-tail of the pancreas has minimally decreased in the interim. The septated, rim enhancing fluid collection within the left posterior pararenal space measuring up to 12 cm in the greatest craniocaudad dimension is without significant interval change. The loculated pseudocyst component extending into the left posterior parietal wall has increased in the interim measuring 9.2 x 3.7 cm in comparison to 7 x 2.5 cm on the previous study. This is pseudocyst of the pancreas is amenable for a drain insertion in the appropriate clinical setting. 3.  Moderately large stool burden in the colon of constipation. 4.  Aortic atherosclerosis (ICD10-I70.0) Electronically Signed   By: Frazier Richards M.D.   On: 12/29/2021 12:51   US Paracentesis  Result Date: 12/27/2021 INDICATION: Patient with history of pancreatic pseudocyst, chronic pancreatitis, alcoholic cirrhosis, ascites, chronic kidney disease. Request received for diagnostic paracentesis up to 500 cc. EXAM: ULTRASOUND GUIDED DIAGNOSTIC PARACENTESIS MEDICATIONS: 8 mL 1% lidocaine COMPLICATIONS: None immediate. PROCEDURE: Informed written consent was obtained from the patient after a discussion of the risks, benefits and alternatives to treatment. A timeout was performed prior to the initiation of the procedure. Initial ultrasound scanning demonstrates a small to moderate amount of ascites within the right upper to mid abdominal quadrant. The right upper to mid abdomen was prepped and draped in the usual sterile fashion. 1% lidocaine was used  for local anesthesia. Following this, a 19 gauge, 7-cm, Yueh catheter was introduced. An ultrasound image was saved for documentation purposes. The paracentesis was performed. The catheter was removed and a dressing was applied. The patient tolerated the procedure well without immediate post procedural complication. FINDINGS: A total of approximately 500 cc of yellow fluid was removed. Samples were sent to the laboratory as requested by the clinical team. IMPRESSION: Successful ultrasound-guided diagnostic paracentesis yielding 500 cc of peritoneal fluid. PLAN: If the patient eventually requires >/=2 paracenteses in a 30 day period, candidacy for formal evaluation by the [facility] will be assessed. Read by: Rowe Robert, PA-C Electronically Signed   By: Jerilynn Mages.  Shick M.D.   On: 12/27/2021 14:01   US Paracentesis  Result Date: 12/26/2021 INDICATION: Patient with history of pancreatic pseudocyst, chronic pancreatitis, alcoholic cirrhosis, cirrhosis by imaging, ascites, chronic kidney disease; request received for diagnostic and therapeutic paracentesis up to 1 liter. EXAM: ULTRASOUND GUIDED DIAGNOSTIC AND THERAPEUTIC PARACENTESIS MEDICATIONS: 8 mL 1% lidocaine COMPLICATIONS: None immediate. PROCEDURE: Informed written consent was obtained from the patient after a discussion of the risks, benefits and alternatives to treatment. A timeout was performed prior to the initiation of the procedure. Initial ultrasound scanning demonstrates a small to moderate amount of ascites within the right upper to mid abdominal quadrant. The right upper to mid abdomen was prepped and draped in the usual sterile fashion. 1% lidocaine was used for local anesthesia. Following this, a 19 gauge, 7-cm, Yueh catheter was introduced. An ultrasound image was saved for documentation purposes. The paracentesis was performed. The catheter was removed and a dressing was applied. The patient  tolerated the procedure well without immediate post procedural complication. FINDINGS: A total of approximately 1 liter of yellow fluid was removed. Samples were sent to the laboratory as requested by the clinical team. IMPRESSION: Successful ultrasound-guided diagnostic and therapeutic paracentesis yielding 1 liter of peritoneal fluid.  PLAN: If the patient eventually requires >/=2 paracenteses in a 30 day period, candidacy for formal evaluation by the [facility] will be assessed. Read by: Rowe Robert, PA-C Electronically Signed   By: Miachel Roux M.D.   On: 12/26/2021 13:11   CT ABDOMEN PELVIS W CONTRAST  Result Date: 12/24/2021 CLINICAL DATA:  Renal mass/cyst, indeterminate renal fluid collection EXAM: CT ABDOMEN AND PELVIS WITH CONTRAST TECHNIQUE: Multidetector CT imaging of the abdomen and pelvis was performed using the standard protocol following bolus administration of intravenous contrast. RADIATION DOSE REDUCTION: This exam was performed according to the departmental dose-optimization program which includes automated exposure control, adjustment of the mA and/or kV according to patient size and/or use of iterative reconstruction technique. CONTRAST:  187mL OMNIPAQUE IOHEXOL 300 MG/ML  SOLN COMPARISON:  Noncontrast CT 12/24/2021, MRI 10/13/2021 FINDINGS: Lower chest: Small left pleural effusion with associated left basilar compressive atelectasis. Mild discoid atelectasis within the right lung base. Extensive coronary artery calcification. Global cardiac size within normal limits. Hepatobiliary: Morphologic changes in keeping with cirrhosis. Geographic peripheral areas of enhancement within the liver predominantly within segments 4 and 8 with associated capsular retraction may represent areas of confluent hepatic fibrosis. No definite intrahepatic enhancing mass identified. Stable moderate extrahepatic biliary ductal dilation, tapering within the  pancreatic head likely related to changes of chronic pancreatitis. Cholecystectomy has been performed. Pancreas: Mild pancreatic parenchymal atrophy. Intraparenchymal loculated fluid collection within the tail the pancreas in keeping with a pancreatic pseudocyst appears decreased when compared to remote prior MRI examination, measuring 2.7 x 4.2 cm at axial image # 31/2, but is in continuity with the fluid collection seen within the a left posterior pararenal space which represents retroperitoneal extension of the pancreatic pseudocyst. This rim enhancing, septated collection measures 8.0 x 8.9 x 12.9 cm in greatest dimension on axial image # 41 and coronal image # 92. Spleen: Unremarkable Adrenals/Urinary Tract: The adrenal glands are unremarkable. The kidneys are normal in size and position. Mild mass effect upon the left kidney related to the left posterior pararenal pseudocyst. No hydronephrosis. No enhancing intrarenal masses. No intrarenal or ureteral calculi. The bladder is decompressed. Stomach/Bowel: Mild sigmoid diverticulosis. Small gastric varices are noted within the gastric fundus. Stomach, small bowel, and large bowel are otherwise unremarkable. Appendix normal. Moderate ascites is present, increased in volume when compared to prior MRI examination. No free intraperitoneal gas. Vascular/Lymphatic: Aortic atherosclerosis. No enlarged abdominal or pelvic lymph nodes. Reproductive: Uterus and bilateral adnexa are unremarkable. Other: No abdominal wall hernia Musculoskeletal: No acute bone abnormality. IMPRESSION: 1. Morphologic changes in keeping with cirrhosis and portal venous hypertension with small gastric varices. Moderate ascites, increased in volume when compared to prior MRI examination. 2. Interval decrease in size of a pancreatic pseudocyst within the tail the pancreas since MRI examination 10/13/2021, however, there is now a septated, rim-enhancing fluid collection within the left posterior  pararenal space measuring up to 12.9 cm in greatest dimension. This is in continuity with the pancreatic pseudocyst and represents retroperitoneal extension of the pancreatic pseudocyst. 3. Extensive coronary artery calcification. 4. Mild sigmoid diverticulosis. Aortic Atherosclerosis (ICD10-I70.0). Electronically Signed   By: Fidela Salisbury M.D.   On: 12/24/2021 19:29   CT Renal Stone Study  Result Date: 12/24/2021 CLINICAL DATA:  Left flank swelling for the past 5 days. EXAM: CT ABDOMEN AND PELVIS WITHOUT CONTRAST TECHNIQUE: Multidetector CT imaging of the abdomen and pelvis was performed following the standard protocol without IV contrast. RADIATION DOSE REDUCTION: This  exam was performed according to the departmental dose-optimization program which includes automated exposure control, adjustment of the mA and/or kV according to patient size and/or use of iterative reconstruction technique. COMPARISON:  10/13/2021 MRI FINDINGS: Lower chest: Right hemidiaphragm elevation. Bibasilar atelectasis. Normal heart size with right coronary artery calcification. Trace left pleural fluid. Hepatobiliary: Advanced cirrhosis, with caudate lobe enlargement. Cholecystectomy. Possible stones within the cystic duct remnant on 32/2. Common duct dilatation is suboptimally evaluated but felt to be less impressive than on the prior MRI. Pancreas: Suboptimally evaluated fluid collection adjacent the pancreatic tail measures on the order of 6.3 x 2.6 cm on 35/2 versus 6.2 x 3.8 cm when remeasured in a similar fashion on the prior MRI. Spleen: Normal noncontrast appearance including at 12.1 cm craniocaudal. Adrenals/Urinary Tract: Normal adrenal glands. Normal noncontrast appearance of the right kidney. A new fluid collection within the left perirenal space measures 7.4 x 4.8 cm on 36/2. An adjacent or contiguous more superficial component, extending into the left flank, is also new since the prior MRI at 5.5 x 3.2 cm on 39/2. No  hydronephrosis. No bladder calculi. Stomach/Bowel: Proximal gastric underdistention. Extensive colonic diverticulosis. Normal appendix. Normal small bowel caliber. Vascular/Lymphatic: Aortic atherosclerosis. Favor portosystemic collaterals within the omentum indicative of portal venous hypertension. No abdominopelvic adenopathy. Reproductive: Normal uterus and adnexa. Other: Moderate volume abdominopelvic ascites, similar to the prior MRI. No free intraperitoneal air. Anasarca is asymmetric left. Musculoskeletal: No acute osseous abnormality. IMPRESSION: 1. Limited exam secondary to stone study technique 2. Development of left perinephric fluid collection with adjacent component versus satellite collection extending into the left flank. Given clinical history, most likely related to progressive pseudocysts. 3. Relatively similar appearance of a presumed pseudocyst adjacent the pancreatic tail. 4. Cirrhosis with probable portal venous hypertension and moderate volume ascites. Small left pleural effusion. 5. Age advanced coronary artery atherosclerosis. Recommend assessment of coronary risk factors. 6. Cholecystectomy with possible stones within the cystic duct remnant. 7.  Aortic Atherosclerosis (ICD10-I70.0). Electronically Signed   By: Abigail Miyamoto M.D.   On: 12/24/2021 15:14     I spent 90  minutes for this patient encounter including review of prior medical records/discussing diagnostics and treatment plan with the patient/family/coordinate care with primary/other specialits with greater than 50% of time in face to face encounter.   Electronically signed by:   Rosiland Oz, MD Infectious Disease Physician [facility] Pager: [phone]

## 2022-01-23 NOTE — Progress Notes (Signed)
Rapid Response Event Note   Reason for Call :   Temp of 95.6 rectally and BP 100/67 (77)   Initial Focused Assessment:  Pt complaints of pain in L flank from psuedocyst at tail end of pancreas, cool extremities, a+o x4    Interventions:  Rectal temp 95.2 rectally Applied warm blanklets  Paged [facility] for P.O pain med    Plan of Care:  Give tramadol  Re-check rectal temp   Event Summary:   MD Notified: [facility] Evelene Croon Call Time: 2951 Arrival Time: 8841 End Time: [place], RN

## 2022-01-23 NOTE — Progress Notes (Signed)
   01/23/22 0620  Assess: MEWS Score  Temp (!) 95.6 F (35.3 C)  BP 100/67  MAP (mmHg) 77  Pulse Rate 65  ECG Heart Rate 65  Resp 18  Level of Consciousness Alert  SpO2 100 %  O2 Device Room Air  Patient Activity (if Appropriate) In bed  Assess: MEWS Score  MEWS Temp 1  MEWS Systolic 1  MEWS Pulse 0  MEWS RR 0  MEWS LOC 0  MEWS Score 2  MEWS Score Color Yellow  Assess: if the MEWS score is Yellow or Red  Were vital signs taken at a resting state? Yes  Focused Assessment No change from prior assessment  Does the patient meet 2 or more of the SIRS criteria? Yes  Does the patient have a confirmed or suspected source of infection? Yes  Provider and Rapid Response Notified? Yes  MEWS guidelines implemented *See Row Information* Yes  Treat  MEWS Interventions Other (Comment) (Applied warm blankets and increased temperature in the room)  Pain Scale 0-10  Pain Score 8  Pain Type Acute pain  Pain Location Flank  Pain Orientation Left;Mid;Posterior  Pain Descriptors / Indicators Discomfort  Pain Frequency Constant  Pain Onset On-going  Patients Stated Pain Goal 5  Pain Intervention(s) Medication (See eMAR);MD notified (Comment) (Notified on call provider due to PRN dilaudid causing BP to drop)  Multiple Pain Sites No  Take Vital Signs  Increase Vital Sign Frequency  Yellow: Q 2hr X 2 then Q 4hr X 2, if remains yellow, continue Q 4hrs  Escalate  MEWS: Escalate Yellow: discuss with charge nurse/RN and consider discussing with provider and RRT  Notify: Charge Nurse/RN  Name of Charge Nurse/RN Notified Karrie Doffing, RN  Date Charge Nurse/RN Notified 01/23/22  Time Charge Nurse/RN Notified 0645  Notify: Provider  Provider Name/Title B. Lennox Grumbles  Date Provider Notified 01/23/22  Time Provider Notified [phone]  Method of Notification Page  Notification Reason Other (Comment) (Patient's low temperature)  Provider response See new orders  Date of Provider Response 01/23/22  Time  of Provider Response [phone]  Notify: Rapid Response  Name of Rapid Response RN Notified Parthenia Ames, RN (Rapid Response RN)  Date Rapid Response Notified 01/23/22  Time Rapid Response Notified [id number]  Document  Progress note created (see row info) Yes  Assess: SIRS CRITERIA  SIRS Temperature  1  SIRS Pulse 0  SIRS Respirations  0  SIRS WBC 1  SIRS Score Sum  2

## 2022-01-23 NOTE — Procedures (Signed)
PROCEDURE SUMMARY:  Successful ultrasound guided paracentesis from the  right lower quadrant.  Yielded 2.9 liters of yellow fluid.  No immediate complications.  The patient tolerated the procedure well.   Specimen was sent for labs.  EBL < 2 cc  The patient has required >/=2 paracenteses in a 30 day period and a screening evaluation by the [facility] has been arranged.   Lindaann Pascal

## 2022-01-23 NOTE — TOC Initial Note (Signed)
Transition of Care [facility]) - Initial/Assessment Note    Patient Details  Name: Mallory Stewart MRN: [id number] Date of Birth: Apr 14, 1961  Transition of [facility]) CM/SW Contact:    Dessa Phi, RN Phone Number: 01/23/2022, 11:18 AM  Clinical Narrative:  Will monitor for d/c needs-noted per nsg dsg change.                 Expected Discharge Plan: Home/Self Care Barriers to Discharge: Continued Medical Work up   Patient Goals and CMS Choice Patient states their goals for this hospitalization and ongoing recovery are:: Home CMS Medicare.gov Compare Post Acute Care list provided to:: Patient    Expected Discharge Plan and Services Expected Discharge Plan: Home/Self Care   Discharge Planning Services: CM Consult   Living arrangements for the past 2 months: Single Family Home                                      Prior Living Arrangements/Services Living arrangements for the past 2 months: Single Family Home Lives with:: Spouse Patient language and need for interpreter reviewed:: Yes Do you feel safe going back to the place where you live?: Yes      Need for Family Participation in Patient Care: Yes (Comment) Care giver support system in place?: Yes (comment)   Criminal Activity/Legal Involvement Pertinent to Current Situation/Hospitalization: No - Comment as needed  Activities of Daily Living Home Assistive Devices/Equipment: Cane (specify quad or straight) ADL Screening (condition at time of admission) Patient's cognitive ability adequate to safely complete daily activities?: Yes Is the patient deaf or have difficulty hearing?: No Does the patient have difficulty seeing, even when wearing glasses/contacts?: No Does the patient have difficulty concentrating, remembering, or making decisions?: No Patient able to express need for assistance with ADLs?: Yes Does the patient have difficulty dressing or bathing?: No Independently performs ADLs?: Yes (appropriate  for developmental age) Does the patient have difficulty walking or climbing stairs?: Yes Weakness of Legs: None Weakness of Arms/Hands: None  Permission Sought/Granted Permission sought to share information with : Case Manager Permission granted to share information with : Yes, Verbal Permission Granted  Share Information with NAME:  (Case Manager)           Emotional Assessment Appearance:: Appears stated age Attitude/Demeanor/Rapport: Gracious Affect (typically observed): Accepting Orientation: : Oriented to Self, Oriented to Place Alcohol / Substance Use: Not Applicable Psych Involvement: No (comment)  Admission diagnosis:  Hyponatremia [E87.1] Cellulitis of back except buttock [L03.312] Sepsis ([place]) [A41.9] Sepsis, due to unspecified organism, unspecified whether acute organ dysfunction present [facility]) [A41.9] Patient Active Problem List   Diagnosis Date Noted   Sepsis ([place]) 01/22/2022   Cellulitis 01/22/2022   Allergic reaction 01/22/2022   Ascites 01/10/2022   Left flank pain    Pancreatic pseudocyst 12/24/2021   SIRS (systemic inflammatory response syndrome) ([place]) 12/24/2021   Shortness of breath 11/28/2021   Diarrhea of presumed infectious origin 11/28/2021   Benign positional vertigo, right 11/23/2021   Acute non-recurrent frontal sinusitis 11/23/2021   Weakness of both lower extremities 11/23/2021   Hyponatremia 11/23/2021   Balance problem 07/04/2021   H/O multiple concussions 07/04/2021   Type 2 diabetes mellitus with hyperglycemia, with long-term current use of insulin ([place]) 03/24/2021   Secondary esophageal varices without bleeding ([place])    Gastric varices without bleeding 12/06/2020   Portal hypertension ([place]) 09/30/2020   Dilated bile duct  0000000   Alcoholic cirrhosis of liver with ascites ([place]) 09/29/2020   Frequent falls 09/14/2020   Splenomegaly 08/09/2020   Malnutrition of moderate degree ([place]) 08/08/2020   Chronic hyponatremia 08/06/2020    Transaminitis 08/06/2020   Serum total bilirubin elevated 08/06/2020   Generalized weakness 08/05/2020   Idiopathic peripheral neuropathy 07/13/2020   Low HDL (under 40) 06/21/2020   Allergic rhinitis 06/21/2020   Duodenitis - edema ? andgioedema 10/30/2018   Chronic alcoholic pancreatitis ([place]) 09/02/2018   Diabetes mellitus with circulatory complication, HTN ([place]) [id number]   Dizziness 12/26/2017   Chronic pain 05/17/2017   Osteoarthritis of spine with radiculopathy, cervical region 09/17/2016   Hyperkalemia 03/02/2016   B12 deficiency 10/21/2014   GAD (generalized anxiety disorder) 09/23/2009   Hyperlipidemia associated with type 2 diabetes mellitus ([place]) 11/03/2008   HTN (hypertension) 09/29/2008   PCP:  [name], MD Pharmacy:   [facility] [id number], [place] [address] Phone: [phone] Fax: [phone]     Social Determinants of Health ([place]) Interventions    Readmission Risk Interventions    12/26/2021   12:51 PM  Readmission Risk Prevention Plan  Transportation Screening Complete  PCP or Specialist Appt within 5-7 Days Complete  Home Care Screening Complete  Medication Review (RN CM) Complete

## 2022-01-24 ENCOUNTER — Other Ambulatory Visit: Payer: Self-pay

## 2022-01-24 ENCOUNTER — Other Ambulatory Visit: Payer: Self-pay | Admitting: Internal Medicine

## 2022-01-24 DIAGNOSIS — L02219 Cutaneous abscess of trunk, unspecified: Secondary | ICD-10-CM | POA: Diagnosis not present

## 2022-01-24 DIAGNOSIS — R935 Abnormal findings on diagnostic imaging of other abdominal regions, including retroperitoneum: Secondary | ICD-10-CM | POA: Diagnosis not present

## 2022-01-24 DIAGNOSIS — Z1211 Encounter for screening for malignant neoplasm of colon: Secondary | ICD-10-CM

## 2022-01-24 DIAGNOSIS — L03319 Cellulitis of trunk, unspecified: Secondary | ICD-10-CM | POA: Diagnosis not present

## 2022-01-24 DIAGNOSIS — Z5181 Encounter for therapeutic drug level monitoring: Secondary | ICD-10-CM | POA: Diagnosis not present

## 2022-01-24 DIAGNOSIS — K863 Pseudocyst of pancreas: Secondary | ICD-10-CM | POA: Diagnosis not present

## 2022-01-24 DIAGNOSIS — K8689 Other specified diseases of pancreas: Secondary | ICD-10-CM

## 2022-01-24 DIAGNOSIS — K859 Acute pancreatitis without necrosis or infection, unspecified: Secondary | ICD-10-CM | POA: Diagnosis not present

## 2022-01-24 DIAGNOSIS — R6520 Severe sepsis without septic shock: Secondary | ICD-10-CM | POA: Diagnosis not present

## 2022-01-24 DIAGNOSIS — L03312 Cellulitis of back [any part except buttock]: Secondary | ICD-10-CM | POA: Diagnosis not present

## 2022-01-24 DIAGNOSIS — A419 Sepsis, unspecified organism: Secondary | ICD-10-CM | POA: Diagnosis not present

## 2022-01-24 LAB — PHOSPHORUS: Phosphorus: 3.7 mg/dL (ref 2.5–4.6)

## 2022-01-24 LAB — AMYLASE, BODY FLUID (OTHER): Amylase, Body Fluid: 30 U/L

## 2022-01-24 LAB — BASIC METABOLIC PANEL
Anion gap: 7 (ref 5–15)
BUN: 17 mg/dL (ref 6–20)
CO2: 24 mmol/L (ref 22–32)
Calcium: 8.1 mg/dL — ABNORMAL LOW (ref 8.9–10.3)
Chloride: 100 mmol/L (ref 98–111)
Creatinine, Ser: 1.08 mg/dL — ABNORMAL HIGH (ref 0.44–1.00)
GFR, Estimated: 59 mL/min — ABNORMAL LOW (ref >60)
Glucose, Bld: 233 mg/dL — ABNORMAL HIGH (ref 70–99)
Potassium: 4.5 mmol/L (ref 3.5–5.1)
Sodium: 131 mmol/L — ABNORMAL LOW (ref 135–145)

## 2022-01-24 LAB — CBC
HCT: 28.2 % — ABNORMAL LOW (ref 36.0–46.0)
Hemoglobin: 9.2 g/dL — ABNORMAL LOW (ref 12.0–15.0)
MCH: 28.7 pg (ref 26.0–34.0)
MCHC: 32.6 g/dL (ref 30.0–36.0)
MCV: 87.9 fL (ref 80.0–100.0)
Platelets: 231 10*3/uL (ref 150–400)
RBC: 3.21 MIL/uL — ABNORMAL LOW (ref 3.87–5.11)
RDW: 13.1 % (ref 11.5–15.5)
WBC: 9.6 10*3/uL (ref 4.0–10.5)
nRBC: 0 % (ref 0.0–0.2)

## 2022-01-24 LAB — MAGNESIUM: Magnesium: 1.5 mg/dL — ABNORMAL LOW (ref 1.7–2.4)

## 2022-01-24 LAB — GLUCOSE, CAPILLARY
Glucose-Capillary: 197 mg/dL — ABNORMAL HIGH (ref 70–99)
Glucose-Capillary: 207 mg/dL — ABNORMAL HIGH (ref 70–99)
Glucose-Capillary: 253 mg/dL — ABNORMAL HIGH (ref 70–99)
Glucose-Capillary: 220 mg/dL — ABNORMAL HIGH (ref 70–99)

## 2022-01-24 MED ORDER — TRAMADOL HCL 50 MG PO TABS
50.0000 mg | ORAL_TABLET | Freq: Four times a day (QID) | ORAL | Status: DC | PRN
  Administered 2022-01-24 – 2022-01-25 (×6): 50 mg via ORAL
  Filled 2022-01-24 (×6): qty 1

## 2022-01-24 MED ORDER — INSULIN GLARGINE-YFGN 100 UNIT/ML ~~LOC~~ SOLN
15.0000 [IU] | Freq: Every day | SUBCUTANEOUS | Status: DC
  Administered 2022-01-24 – 2022-01-29 (×6): 15 [IU] via SUBCUTANEOUS
  Filled 2022-01-24 (×6): qty 0.15

## 2022-01-24 MED ORDER — MAGNESIUM SULFATE 2 GM/50ML IV SOLN
2.0000 g | Freq: Once | INTRAVENOUS | Status: AC
  Administered 2022-01-24: 2 g via INTRAVENOUS
  Filled 2022-01-24: qty 50

## 2022-01-24 MED ORDER — ASPIRIN 81 MG PO TBEC
81.0000 mg | DELAYED_RELEASE_TABLET | Freq: Every day | ORAL | Status: DC
  Administered 2022-01-24 – 2022-01-29 (×6): 81 mg via ORAL
  Filled 2022-01-24 (×6): qty 1

## 2022-01-24 MED ORDER — VANCOMYCIN HCL 125 MG PO CAPS
125.0000 mg | ORAL_CAPSULE | Freq: Two times a day (BID) | ORAL | Status: DC
  Administered 2022-01-24 – 2022-01-29 (×11): 125 mg via ORAL
  Filled 2022-01-24 (×11): qty 1

## 2022-01-24 MED ORDER — VANCOMYCIN HCL 1250 MG/250ML IV SOLN
1250.0000 mg | INTRAVENOUS | Status: DC
  Administered 2022-01-24: 1250 mg via INTRAVENOUS
  Administered 2022-01-25: 1000 mg via INTRAVENOUS
  Filled 2022-01-24 (×2): qty 250

## 2022-01-24 NOTE — Progress Notes (Signed)
Pharmacy Antibiotic Note  Mallory Stewart is a 61 y.o. female admitted on 01/22/2022 with Sepsis secondary to cellulitis and draining pseudocyst.  Pharmacy has been consulted for Vancomycin and Unasyn dosing.  Plan: Continue Unasyn 3g IV q6h.  Decreased to Vancomycin 1250 mg IV q24h (SCr inc to 1.08, est AUC 465) Measure Vanc levels as needed.  Goal AUC = 400 - 550. Follow up renal function, culture results, and clinical course.   Height: 5\' 9"  (175.3 cm) Weight: 70.4 kg (155 lb 3.3 oz) IBW/kg (Calculated) : 66.2  Temp (24hrs), Avg:97.7 F (36.5 C), Min:97.5 F (36.4 C), Max:98 F (36.7 C)  Recent Labs  Lab 01/17/22 0954 01/22/22 1217 01/22/22 1600 01/23/22 0508 01/24/22 0452  WBC  --  15.7*  --  10.1 9.6  CREATININE 1.03 0.97 0.91 1.04* 1.08*  LATICACIDVEN  --  2.0* 2.0*  --   --      Estimated Creatinine Clearance: 58.6 mL/min (A) (by C-G formula based on SCr of 1.08 mg/dL (H)).    Allergies  Allergen Reactions   Ceftriaxone Hives    Immediate bright red itchy rash on bilateral legs after starting infusion.     Gluten Meal Other (See Comments)    Stomach distress   Zocor [Simvastatin] Other (See Comments)    Elevated lfts?    Antimicrobials this admission: 9/18 Ceftriaxone x1  9/18 Vancomycin >>  9/18 Unasyn >>   Dose adjustments this admission: 9/20 Empiric vanc dose adjustment for SCr increase.   Microbiology results: 9/18 BCx: ngtd 9/19 Abscess:  9/19 Peritoneal fluid:   Thank you for allowing pharmacy to be a part of this patient's care.  Gretta Arab PharmD, BCPS Clinical Pharmacist [facility] main pharmacy [phone] 01/24/2022 8:16 AM

## 2022-01-24 NOTE — Progress Notes (Signed)
PROGRESS NOTE  Mallory GageKathleen L Weintraub  ZOX:[id number] DOB: Sep 25, 1960 DOA: 01/22/2022 PCP: Excell SeltzerBedsole, Amy E, MD   Brief Narrative:  Patient is a 61 year old female with history of chronic pancreatitis with pseudocyst with extension into the retroperitoneum, type 2 diabetes, cirrhosis, hypertension who presented with abdominal distention, erythema of the left flank, chills, draining from left flank.  No history of fever.  CT imaging showed features of cellulitis of the left flank, large amount of ascites , a large peripherally enhancing complex fluid collection  within the left posterior pararenal space, with posterior extension through the posterior left abdominal wall.  General surgery, IR, GI, ID consulted and following  Assessment & Plan:  Principal Problem:   Sepsis (HCC) Active Problems:   Ascites   HTN (hypertension)   Pancreatic abscess   Abnormal CT of the abdomen   Chronic hyponatremia   Type 2 diabetes mellitus with hyperglycemia, with long-term current use of insulin (HCC)   Pancreatic pseudocyst   Cellulitis   Allergic reaction   Acute pancreatic fluid collection   Cellulitis of back  Sepsis secondary to cellulitis, draining pseudocyst: Presented with tachycardia, tachypnea, elevated lactate.  Found to have cellulitis of the left flank, large amount of ascites , a large peripherally enhancing complex fluid collection  within the left posterior pararenal space, with posterior extension through the posterior left abdominal wall.  Started on broad-spectrum antibiotics, cultures sent. Continue current antibiotics for now.  Currently on vancomycin, Unasyn. General surgery also consulted for cellulitis.She has  a draining wound on her left back  Posterior left abdominal wall fluid collection: Likely secondary to pseudocyst drainage.  IR consulted and she underwent ultrasound-guided aspiration of the left posterior abdominal wall collection.  Fluid was bloody, purulent.  Follow-up  cultures.  We will do repeat imaging see if necessary.  Alcoholic cirrhosis/pancreatic pseudocyst: GI following.  Has history of portal hypertension.  Has history of chronic alcoholic pancreatitis with recent peripancreatic/retroperitoneal infected pseudocyst/abscess.  Now being managed for cellulitis around the area of previous aspiration site.  GI planning for MRI/MRCP next week.  Type 2 diabetes: Continue current insulin regimen.  Monitor blood sugars  Hypertension: On propanolol, Lasix, spironolactone at home.  Currently on hold  Chronic hyponatremia: Likely from cirrhosis.  Sodium remained stable around 130  Hypomagnesemia: Supplemented with magnesium         DVT prophylaxis:SCDs Start: 01/22/22 1701     Code Status: Full Code  Family Communication: None at bedside  Patient status:Inpatient  Patient is from :Home  Anticipated discharge WJ:XBJYto:Home  Estimated DC date:2-3 days   Consultants: GI,IR  Procedures:None  Antimicrobials:  Anti-infectives (From admission, onward)    Start     Dose/Rate Route Frequency Ordered Stop   01/24/22 1400  vancomycin (VANCOREADY) IVPB 1250 mg/250 mL        1,250 mg 166.7 mL/hr over 90 Minutes Intravenous Every 24 hours 01/24/22 0819     01/23/22 1400  vancomycin (VANCOREADY) IVPB 1500 mg/300 mL  Status:  Discontinued        1,500 mg 150 mL/hr over 120 Minutes Intravenous Every 24 hours 01/22/22 1604 01/24/22 0819   01/22/22 1800  Ampicillin-Sulbactam (UNASYN) 3 g in sodium chloride 0.9 % 100 mL IVPB        3 g 200 mL/hr over 30 Minutes Intravenous Every 6 hours 01/22/22 1714     01/22/22 1345  cefTRIAXone (ROCEPHIN) 2 g in sodium chloride 0.9 % 100 mL IVPB        2  g 200 mL/hr over 30 Minutes Intravenous  Once 01/22/22 1342 01/22/22 1430   01/22/22 1345  vancomycin (VANCOREADY) IVPB 1250 mg/250 mL        1,250 mg 166.7 mL/hr over 90 Minutes Intravenous  Once 01/22/22 1342 01/23/22 0435       Subjective: Patient seen and  examined at the bedside today.  She feels much better today.  She was sitting in the chair, comfortable.  Complains of pain on the draining  wound on her left back  Objective: Vitals:   01/23/22 2206 01/23/22 2347 01/24/22 0048 01/24/22 0350  BP: 95/60 102/76  122/70  Pulse: (!) 59   (!) 55  Resp: 20   13  Temp: 97.6 F (36.4 C) (!) 97.5 F (36.4 C) (!) 97.5 F (36.4 C) 97.6 F (36.4 C)  TempSrc: Oral Oral Oral Oral  SpO2: 100%   100%  Weight:      Height:        Intake/Output Summary (Last 24 hours) at 01/24/2022 0941 Last data filed at 01/24/2022 0300 Gross per 24 hour  Intake 822.98 ml  Output --  Net 822.98 ml   Filed Weights   01/22/22 1725  Weight: 70.4 kg    Examination:  General exam: Overall comfortable, not in distress HEENT: PERRL Respiratory system:  no wheezes or crackles  Cardiovascular system: S1 & S2 heard, RRR.  Gastrointestinal system: Abdomen is nondistended, soft and nontender. Central nervous system: Alert and oriented Extremities: No edema, no clubbing ,no cyanosis Skin: Erythematous draining wound on the left back   Data Reviewed: I have personally reviewed following labs and imaging studies  CBC: Recent Labs  Lab 01/22/22 1217 01/23/22 0508 01/24/22 0452  WBC 15.7* 10.1 9.6  NEUTROABS 13.6*  --   --   HGB 10.0* 9.6* 9.2*  HCT 30.7* 29.7* 28.2*  MCV 87.7 88.9 87.9  PLT 261 217 231   Basic Metabolic Panel: Recent Labs  Lab 01/17/22 0954 01/22/22 1217 01/22/22 1600 01/23/22 0508 01/24/22 0452  NA 132* 126* 128* 130* 131*  K 4.6 3.7 3.7 4.2 4.5  CL 95* 93* 94* 98 100  CO2 27 25 26 23 24   GLUCOSE 114* 174* 110* 218* 233*  BUN 11 10 10 12 17   CREATININE 1.03 0.97 0.91 1.04* 1.08*  CALCIUM 8.9 8.4* 8.5* 8.2* 8.1*  MG  --   --   --   --  1.5*  PHOS  --   --   --   --  3.7     Recent Results (from the past 240 hour(s))  Blood Culture (routine x 2)     Status: None (Preliminary result)   Collection Time: 01/22/22 12:17 PM    Specimen: BLOOD  Result Value Ref Range Status   Specimen Description   Final    BLOOD RIGHT ANTECUBITAL Performed at [facility], [address], [place], [place]    Special Requests   Final    BOTTLES DRAWN AEROBIC AND ANAEROBIC Blood Culture adequate volume Performed at [facility], [address] [place], [place]    Culture   Final    NO GROWTH < 24 HOURS Performed at [facility], [address] [place], [address] [place]    Report Status PENDING  Incomplete  Blood Culture (routine x 2)     Status: None (Preliminary result)   Collection Time: 01/22/22  5:06 PM   Specimen: BLOOD  Result Value Ref  Range Status   Specimen Description   Final    BLOOD SITE NOT SPECIFIED Performed at [facility], [address], [place], [place] [address]    Special Requests   Final    BOTTLES DRAWN AEROBIC ONLY Blood Culture adequate volume Performed at [facility], [address] [place], [place] [address]    Culture   Final    NO GROWTH < 12 HOURS Performed at [facility], [address], [place], [place] [address]    Report Status PENDING  Incomplete  Aerobic/Anaerobic Culture w Gram Stain (surgical/deep wound)     Status: None (Preliminary result)   Collection Time: 01/23/22 10:06 AM   Specimen: PATH Cytology Peritoneal fluid  Result Value Ref Range Status   Specimen Description   Final    PERITONEAL Performed at [facility], [address] [place], [place] [address]    Special Requests   Final    NONE Performed at [facility], [address], [place], [place] [address]    Gram Stain NO WBC SEEN NO ORGANISMS SEEN   Final   Culture   Final    NO GROWTH < 24 HOURS Performed at [facility], [address] [place], [place] [address]    Report Status PENDING  Incomplete  Aerobic/Anaerobic Culture w Gram Stain  (surgical/deep wound)     Status: None (Preliminary result)   Collection Time: 01/23/22  4:59 PM   Specimen: Abscess  Result Value Ref Range Status   Specimen Description   Final    ABSCESS Performed at [facility], [address] [place], [place] [address]    Special Requests   Final    NONE Performed at [facility], [address], [place], [place] [address]    Gram Stain   Final    ABUNDANT WBC PRESENT, PREDOMINANTLY PMN NO ORGANISMS SEEN    Culture   Final    TOO YOUNG TO READ Performed at [facility], [address] [place], [place] [address]    Report Status PENDING  Incomplete     Radiology Studies: US Paracentesis  Result Date: 01/23/2022 INDICATION: Patient with history of pancreatic pseudocyst, chronic pancreatitis, alcoholic cirrhosis, recurrent ascites. Request received for diagnostic and therapeutic paracentesis up to 3 liters. EXAM: ULTRASOUND GUIDED DIAGNOSTIC AND THERAPEUTIC PARACENTESIS MEDICATIONS: 10 ml 1% lidocaine COMPLICATIONS: None immediate. PROCEDURE: Informed written consent was obtained from the patient after a discussion of the risks, benefits and alternatives to treatment. A timeout was performed prior to the initiation of the procedure. Initial ultrasound scanning demonstrates a moderate amount of ascites within the right lower abdominal quadrant. The right lower abdomen was prepped and draped in the usual sterile fashion. 1% lidocaine was used for local anesthesia. Following this, a 19 gauge, 10-cm, Yueh catheter was introduced. An ultrasound image was saved for documentation purposes. The paracentesis was performed. The catheter was removed and a dressing was applied. The patient tolerated the procedure well without immediate post procedural complication. FINDINGS: A total of approximately 2.9 liters of yellow fluid was removed. Samples were sent to the laboratory as requested by the clinical team. IMPRESSION:  Successful ultrasound-guided diagnostic and therapeutic paracentesis yielding 2.9 liters of peritoneal fluid. PLAN: The patient has required >/=2 paracenteses in a 30 day period and a formal evaluation by the [facility] has been arranged. Read by: Jeananne Rama, PA-C Electronically Signed  By: Eugenie Filler M.D.   On: 01/23/2022 12:11   CT ABDOMEN PELVIS W CONTRAST  Result Date: 01/22/2022 CLINICAL DATA:  Abdominal pain, acute, nonlocalized. Left flank pain. History of pancreatic pseudocyst. EXAM: CT ABDOMEN AND PELVIS WITH CONTRAST TECHNIQUE: Multidetector CT imaging of the abdomen and pelvis was performed using the standard protocol following bolus administration of intravenous contrast. RADIATION DOSE REDUCTION: This exam was performed according to the departmental dose-optimization program which includes automated exposure control, adjustment of the mA and/or kV according to patient size and/or use of iterative reconstruction technique. CONTRAST:  158mL OMNIPAQUE IOHEXOL 300 MG/ML  SOLN COMPARISON:  Abdominopelvic CT 01/10/2022 and 01/01/2022. FINDINGS: Lower chest: Similar atelectasis or scarring at both lung bases. No significant pleural or pericardial effusion. There is aortic and coronary artery atherosclerosis. Hepatobiliary: Contour irregularity of the liver and relative enlargement of the caudate and left lobes again noted, suspicious for cirrhosis. No focal liver lesions are identified. The gallbladder is surgically absent. There are chronic small calcified stones within the cystic duct remnant. Stable mild extrahepatic biliary dilatation without definite signs of choledocholithiasis. No intrahepatic biliary dilatation. Pancreas: Chronic pancreatic atrophy without evidence of acute inflammation or pancreatic ductal dilatation. Peripancreatic collection adjacent to the pancreatic tail shows further decrease in size, now measuring 1.6 cm on image 33/2  (previously 2.2 cm). Spleen: Spleen size is stable at the upper limits of normal. Adrenals/Urinary Tract: Both adrenal glands appear normal. No evidence of urinary tract calculus, suspicious renal lesion or hydronephrosis. The bladder appears unremarkable for its degree of distention. Stomach/Bowel: No enteric contrast administered. The stomach appears unremarkable for its degree of distension. No evidence of bowel wall thickening, distention or surrounding inflammatory change. Scattered colonic diverticulosis. Vascular/Lymphatic: There are no enlarged abdominal or pelvic lymph nodes. No acute vascular findings are identified. Mild aortic and branch vessel atherosclerosis without aneurysm. There are multiple chronic portosystemic venous collaterals in the left upper quadrant of the abdomen. Reproductive: Small left fundal uterine fibroid.  No adnexal mass. Other: A large amount of ascites is again noted which is similar in volume to the most recent prior studies. A large peripherally enhancing complex fluid collection is again noted within the left posterior pararenal space, with posterior extension through the posterior left abdominal wall. This has irregular margins and is difficult to measure, but shows further decrease in size compared with the most recent study. It currently measures approximately 7.5 x 3.9 cm transverse on image 44/2 (previously 7.3 x 5.5 cm). On the coronal images, it measures up to 15.2 cm on image 110/4 (previously 14.8 cm). This may extend to the skin surface, and there is increased surrounding inflammation in the subcutaneous fat without soft tissue emphysema. No new or enlarging fluid collections are identified. Musculoskeletal: No acute or significant osseous findings. Mild spondylosis. No osseous destruction identified. IMPRESSION: 1. Interval slight decrease in size of complex peripherally enhancing fluid collection extending from the left posterior pararenal space through the  abdominal wall into the subcutaneous fat. This has been previously aspirated under ultrasound and is felt to reflect a pseudocyst. This may extend to the skin surface, and there appears to be increased surrounding subcutaneous edema which could reflect superimposed cellulitis. 2. No new or enlarging fluid collections are identified. There is a large amount of ascites which appears unchanged. 3. No acute abdominopelvic findings. 4. Hepatic cirrhosis with portal hypertension manifesting as multiple portosystemic collaterals. Electronically Signed   By: Richardean Sale M.D.   On: 01/22/2022 13:23  DG Chest Port 1 View  Result Date: 01/22/2022 CLINICAL DATA:  Questionable sepsis.  Evaluate for abnormality. EXAM: PORTABLE CHEST 1 VIEW COMPARISON:  AP chest 08/07/2021 FINDINGS: Cardiac silhouette and mediastinal contours within normal limits. Moderate right mild left decreased lung volumes, mildly worsened from prior. Right basilar horizontal linear likely subsegmental atelectasis. No definite pleural effusion. No pneumothorax is seen. No acute skeletal abnormality. IMPRESSION: Low lung volumes with right basilar horizontal linear likely subsegmental atelectasis. It is difficult to entirely exclude underlying pneumonia, however that is felt less likely. Electronically Signed   By: Neita Garnet M.D.   On: 01/22/2022 12:54    Scheduled Meds:  aspirin EC  81 mg Oral Daily   insulin aspart  0-5 Units Subcutaneous QHS   insulin aspart  0-9 Units Subcutaneous TID WC   insulin glargine-yfgn  15 Units Subcutaneous Daily   pantoprazole  40 mg Oral BID   Continuous Infusions:  sodium chloride 100 mL/hr at 01/24/22 0046   ampicillin-sulbactam (UNASYN) IV 3 g (01/24/22 6213)   phytonadione (VITAMIN K) 10 mg in dextrose 5 % 50 mL IVPB 10 mg (01/23/22 1752)   vancomycin       LOS: 2 days   Burnadette Pop, MD [facility] P9/20/2023, 9:41 AM

## 2022-01-24 NOTE — Progress Notes (Signed)
The proposed treatment discussed in conference is for discussion purpose only and is not a binding recommendation.  The patients have not been physically examined, or presented with their treatment options.  Therefore, final treatment plans cannot be decided.  

## 2022-01-24 NOTE — Plan of Care (Signed)
  Problem: Activity: Goal: Risk for activity intolerance will decrease Outcome: Progressing   Problem: Nutrition: Goal: Adequate nutrition will be maintained Outcome: Progressing   Problem: Pain Managment: Goal: General experience of comfort will improve Outcome: Progressing   

## 2022-01-24 NOTE — Progress Notes (Signed)
[facility] Progress Note  CC:  pancreatic pseudocyst          Subjective: She is feeling much better today after having the left posterior abdominal fluid collection drained.  She denies having any nausea or vomiting.  No abdominal pain.   Objective:  Vital signs in last 24 hours: Temp:  [97.5 F (36.4 C)-98 F (36.7 C)] 97.7 F (36.5 C) (09/20 1120) Pulse Rate:  [55-86] 74 (09/20 1120) Resp:  [13-20] 17 (09/20 1120) BP: (95-122)/(60-81) 114/81 (09/20 1120) SpO2:  [100 %] 100 % (09/20 1120) Last BM Date : 01/21/22 General: 61 year old female sitting up in the chair in no acute distress. Heart: Regular rate and rhythm, no murmurs. Pulm: Breath sounds clear. Abdomen: Soft, nondistended.  Nontender.  Positive bowel sounds all 4 quadrants.  Left posterior flank dressing intact, surrounding area is erythematous with a scant amount of purulent matter from the small pustules.  Skin is mildly saturated with clear, drainage, no bloody drainage. Extremities:  Without edema. Neurologic:  Alert and  oriented x 4. Grossly normal neurologically. Psych:  Alert and cooperative. Normal mood and affect.  Intake/Output from previous day: 09/19 0701 - 09/20 0700 In: 1063 [P.O.:240; I.V.:223.3; IV Piggyback:599.7] Out: -  Intake/Output this shift: No intake/output data recorded.  Lab Results: Recent Labs    01/22/22 1217 01/23/22 0508 01/24/22 0452  WBC 15.7* 10.1 9.6  HGB 10.0* 9.6* 9.2*  HCT 30.7* 29.7* 28.2*  PLT 261 217 231   BMET Recent Labs    01/22/22 1600 01/23/22 0508 01/24/22 0452  NA 128* 130* 131*  K 3.7 4.2 4.5  CL 94* 98 100  CO2 26 23 24   GLUCOSE 110* 218* 233*  BUN 10 12 17   CREATININE 0.91 1.04* 1.08*  CALCIUM 8.5* 8.2* 8.1*   LFT Recent Labs    01/23/22 0508  PROT 5.9*  ALBUMIN 2.2*  AST 16  ALT 7  ALKPHOS 94  BILITOT 0.9   PT/INR Recent Labs    01/22/22 1217 01/23/22 0508  LABPROT 17.8* 20.7*  INR 1.5* 1.8*   Hepatitis  Panel No results for input(s): "HEPBSAG", "HCVAB", "HEPAIGM", "HEPBIGM" in the last 72 hours.  US Paracentesis  Result Date: 01/23/2022 INDICATION: Patient with history of pancreatic pseudocyst, chronic pancreatitis, alcoholic cirrhosis, recurrent ascites. Request received for diagnostic and therapeutic paracentesis up to 3 liters. EXAM: ULTRASOUND GUIDED DIAGNOSTIC AND THERAPEUTIC PARACENTESIS MEDICATIONS: 10 ml 1% lidocaine COMPLICATIONS: None immediate. PROCEDURE: Informed written consent was obtained from the patient after a discussion of the risks, benefits and alternatives to treatment. A timeout was performed prior to the initiation of the procedure. Initial ultrasound scanning demonstrates a moderate amount of ascites within the right lower abdominal quadrant. The right lower abdomen was prepped and draped in the usual sterile fashion. 1% lidocaine was used for local anesthesia. Following this, a 19 gauge, 10-cm, Yueh catheter was introduced. An ultrasound image was saved for documentation purposes. The paracentesis was performed. The catheter was removed and a dressing was applied. The patient tolerated the procedure well without immediate post procedural complication. FINDINGS: A total of approximately 2.9 liters of yellow fluid was removed. Samples were sent to the laboratory as requested by the clinical team. IMPRESSION: Successful ultrasound-guided diagnostic and therapeutic paracentesis yielding 2.9 liters of peritoneal fluid. PLAN: The patient has required >/=2 paracenteses in a 30 day period and a formal evaluation by the [facility] has been arranged. Read by: Rowe Robert, PA-C Electronically  Signed   By: Judie Petit.  Shick M.D.   On: 01/23/2022 12:11   CT ABDOMEN PELVIS W CONTRAST  Result Date: 01/22/2022 CLINICAL DATA:  Abdominal pain, acute, nonlocalized. Left flank pain. History of pancreatic pseudocyst. EXAM: CT ABDOMEN AND PELVIS WITH  CONTRAST TECHNIQUE: Multidetector CT imaging of the abdomen and pelvis was performed using the standard protocol following bolus administration of intravenous contrast. RADIATION DOSE REDUCTION: This exam was performed according to the departmental dose-optimization program which includes automated exposure control, adjustment of the mA and/or kV according to patient size and/or use of iterative reconstruction technique. CONTRAST:  OMNIPAQUE IOHEXOL 300 MG/ML  SOLN COMPARISON:  Abdominopelvic CT 01/10/2022 and 01/01/2022. FINDINGS: Lower chest: Similar atelectasis or scarring at both lung bases. No significant pleural or pericardial effusion. There is aortic and coronary artery atherosclerosis. Hepatobiliary: Contour irregularity of the liver and relative enlargement of the caudate and left lobes again noted, suspicious for cirrhosis. No focal liver lesions are identified. The gallbladder is surgically absent. There are chronic small calcified stones within the cystic duct remnant. Stable mild extrahepatic biliary dilatation without definite signs of choledocholithiasis. No intrahepatic biliary dilatation. Pancreas: Chronic pancreatic atrophy without evidence of acute inflammation or pancreatic ductal dilatation. Peripancreatic collection adjacent to the pancreatic tail shows further decrease in size, now measuring 1.6 cm on image 33/2 (previously 2.2 cm). Spleen: Spleen size is stable at the upper limits of normal. Adrenals/Urinary Tract: Both adrenal glands appear normal. No evidence of urinary tract calculus, suspicious renal lesion or hydronephrosis. The bladder appears unremarkable for its degree of distention. Stomach/Bowel: No enteric contrast administered. The stomach appears unremarkable for its degree of distension. No evidence of bowel wall thickening, distention or surrounding inflammatory change. Scattered colonic diverticulosis. Vascular/Lymphatic: There are no enlarged abdominal or pelvic lymph  nodes. No acute vascular findings are identified. Mild aortic and branch vessel atherosclerosis without aneurysm. There are multiple chronic portosystemic venous collaterals in the left upper quadrant of the abdomen. Reproductive: Small left fundal uterine fibroid.  No adnexal mass. Other: A large amount of ascites is again noted which is similar in volume to the most recent prior studies. A large peripherally enhancing complex fluid collection is again noted within the left posterior pararenal space, with posterior extension through the posterior left abdominal wall. This has irregular margins and is difficult to measure, but shows further decrease in size compared with the most recent study. It currently measures approximately 7.5 x 3.9 cm transverse on image 44/2 (previously 7.3 x 5.5 cm). On the coronal images, it measures up to 15.2 cm on image 110/4 (previously 14.8 cm). This may extend to the skin surface, and there is increased surrounding inflammation in the subcutaneous fat without soft tissue emphysema. No new or enlarging fluid collections are identified. Musculoskeletal: No acute or significant osseous findings. Mild spondylosis. No osseous destruction identified. IMPRESSION: 1. Interval slight decrease in size of complex peripherally enhancing fluid collection extending from the left posterior pararenal space through the abdominal wall into the subcutaneous fat. This has been previously aspirated under ultrasound and is felt to reflect a pseudocyst. This may extend to the skin surface, and there appears to be increased surrounding subcutaneous edema which could reflect superimposed cellulitis. 2. No new or enlarging fluid collections are identified. There is a large amount of ascites which appears unchanged. 3. No acute abdominopelvic findings. 4. Hepatic cirrhosis with portal hypertension manifesting as multiple portosystemic collaterals. Electronically Signed   By: Carey Bullocks M.D.   On:  01/22/2022 13:23   DG Chest Port 1 View  Result Date: 01/22/2022 CLINICAL DATA:  Questionable sepsis.  Evaluate for abnormality. EXAM: PORTABLE CHEST 1 VIEW COMPARISON:  AP chest 08/07/2021 FINDINGS: Cardiac silhouette and mediastinal contours within normal limits. Moderate right mild left decreased lung volumes, mildly worsened from prior. Right basilar horizontal linear likely subsegmental atelectasis. No definite pleural effusion. No pneumothorax is seen. No acute skeletal abnormality. IMPRESSION: Low lung volumes with right basilar horizontal linear likely subsegmental atelectasis. It is difficult to entirely exclude underlying pneumonia, however that is felt less likely. Electronically Signed   By: Neita Garnet M.D.   On: 01/22/2022 12:54      Assessment / Plan:  65) 61 year old female with alcohol associated chronic pancreatitis with peripancreatic/retroperitoneal infected pseudocyst/abscess status post aspiration readmitted to the hospital with sepsis secondary to cellulitis around the area of previous aspiration site. S/P ultrasound-guided aspiration of the left posterior abdominal wall collection by interventional radiology 01/23/2022.  She had spontaneous drainage/leakage from the site prior to aspiration and a moderate amount of fluid drained externally by compressing this area manually.  15 mL of thick bloody purulent aspirated fluid was sent for analysis, culture/lipase results pending.  Followed by ID. On Unasyn and Vanco IV. -Continue Unasyn IV and Vanco per ID -Abdominal MRI/MRCP with and without contrast tomorrow to reevaluate the biliary duct and pancreatic pseudocyst -CBC, BMP and hepatic panel in a.m. -2 g low-sodium/low-carb diet -Await further recommendations per Dr. Meridee Score  2) Cirrhosis, secondary to alcohol use disorder with esophageal, gastric varices and ascites. S/P paracentesis by IR, removed 2.9 L of clear peritoneal fluid. No SBP.  3) Chronic normocytic anemia.  No  overt GI bleeding.  Hemoglobin 9.2.  4) Hyponatremia, secondary to cirrhosis  Principal Problem:   Sepsis (HCC) Active Problems:   HTN (hypertension)   Pancreatic abscess   Abnormal CT of the abdomen   Chronic hyponatremia   Type 2 diabetes mellitus with hyperglycemia, with long-term current use of insulin (HCC)   Pancreatic pseudocyst   Ascites   Cellulitis   Allergic reaction   Acute pancreatic fluid collection   Cellulitis of back     LOS: 2 days   Arnaldo Natal  01/24/2022, 11:26 AM

## 2022-01-24 NOTE — Progress Notes (Signed)
[facility] Infectious Diseases Follow Up Note  Patient Identification: Patient Name: Mallory Stewart MRN: [id number] Admit Date: 01/22/2022 10:46 AM Age: 61 y.o.Today's Date: 01/24/2022  Reason for Visit: Left flank cellulitis and draining wound   Principal Problem:   Sepsis (HCC) Active Problems:   HTN (hypertension)   Pancreatic abscess   Abnormal CT of the abdomen   Chronic hyponatremia   Type 2 diabetes mellitus with hyperglycemia, with long-term current use of insulin (HCC)   Pancreatic pseudocyst   Ascites   Cellulitis   Allergic reaction   Acute pancreatic fluid collection   Cellulitis of back   Infected pseudocyst of pancreas  Antibiotics:  Vancomycin 9/18- Ceftriaxone 9/18, Unasyn 9/18-   Lines/Hardware: PIV  Interval Events: afebrile, no leukocytosis  Assessment # Infected Pseudocyst with drainage at the left flank with associated cellulitis/abscess - GI also considering pancreatic leak  # Alcoholic Liver Cirrhosis with portal HTN/Ascites/Varices and splenomegaly  - Follows Dr Leone Payor and Liver care  - No signs of SBP on exam. S/p paracentesis with only 234 WBCs. No organisms in gram stain and NG in less than 24 hrs  - 9/19 s/p US guided aspiration of left posterior abdominal wall collection. Findings " Thick bloody purulent fluid aspirated with Yueh catheter, 15 ml.  Thin yellow fluid draining from the small pustules on skin"   # Medication monitoring - Cr stable at  1.08 # C diff July 2023, treated : 1st episode - On prophylactic Vancomycin   Recommendations Continue Vancomycin, pharmacy to dose and Unasyn as is  Fu IR aspirate cultures ( Kleb pneumo, S is pending) GI and general surgery closely following - plans for MRCP as well as I and D noted.  Monitor CBC and BMP  Following   Rest of the management as per the primary team. Thank you for the consult. Please page with pertinent questions or  concerns.  ______________________________________________________________________ Subjective patient seen and examined at the bedside.  Feels Ok after IR aspiration with no new complaints  Vitals BP 114/81 (BP Location: Right Arm)   Pulse 74   Temp 97.7 F (36.5 C)   Resp 17   Ht 5\' 9"  (1.753 m)   Wt 70.4 kg   SpO2 100%   BMI 22.92 kg/m     Physical Exam Constitutional:  Sitting up in the chair and appears comfortable     Comments:   Cardiovascular:     Rate and Rhythm: Normal rate and regular rhythm.     Heart sounds:   Pulmonary:     Effort: Pulmonary effort is normal.     Comments:   Abdominal:     Palpations: Abdomen is soft.     Tenderness: non distended   Musculoskeletal:        General: No swelling or tenderness in peripheral joints   Skin:    Comments: Rt flank with blanchable redness, swelling with central fluctuance/ few draining pustules noted, warmth ( seen by surgery with plans for I and D)  Neurological:     General: awake, alert and oriented and spontaneously moving all extremities   Psychiatric:        Mood and Affect: Mood normal.   Pertinent Microbiology Results for orders placed or performed during the hospital encounter of 01/22/22  Blood Culture (routine x 2)     Status: None (Preliminary result)   Collection Time: 01/22/22 12:17 PM   Specimen: BLOOD  Result Value Ref Range Status   Specimen Description  Final    BLOOD RIGHT ANTECUBITAL Performed at [facility] [address], [place], [place] [address]    Special Requests   Final    BOTTLES DRAWN AEROBIC AND ANAEROBIC Blood Culture adequate volume Performed at [facility] [address] [place], [place] [address]    Culture   Final    NO GROWTH 2 DAYS Performed at [facility] [address] [place], [place] [address]    Report Status PENDING  Incomplete  Blood Culture (routine x 2)     Status: None (Preliminary result)    Collection Time: 01/22/22  5:06 PM   Specimen: BLOOD  Result Value Ref Range Status   Specimen Description   Final    BLOOD SITE NOT SPECIFIED Performed at [facility] [address], [place], [place] [address]    Special Requests   Final    BOTTLES DRAWN AEROBIC ONLY Blood Culture adequate volume Performed at [facility] [address], [place], [place] [address]    Culture   Final    NO GROWTH 2 DAYS Performed at [facility] [address], [place], [place] [address]    Report Status PENDING  Incomplete  Aerobic/Anaerobic Culture w Gram Stain (surgical/deep wound)     Status: None (Preliminary result)   Collection Time: 01/23/22 10:06 AM   Specimen: PATH Cytology Peritoneal fluid  Result Value Ref Range Status   Specimen Description   Final    PERITONEAL Performed at [facility] [address], [place], [place] [address]    Special Requests   Final    NONE Performed at [facility], [place] [address] [place], [place] [address]    Gram Stain NO WBC SEEN NO ORGANISMS SEEN   Final   Culture   Final    NO GROWTH < 24 HOURS Performed at [facility], [place] [address], [place], [place] [address]    Report Status PENDING  Incomplete  Aerobic/Anaerobic Culture w Gram Stain (surgical/deep wound)     Status: None (Preliminary result)   Collection Time: 01/23/22  4:59 PM   Specimen: Abscess  Result Value Ref Range Status   Specimen Description   Final    ABSCESS Performed at [facility] [address], [place], [place] [address]    Special Requests   Final    NONE Performed at [facility], [place] [address] [place], [place] [address]    Gram Stain   Final    ABUNDANT WBC PRESENT, PREDOMINANTLY PMN NO ORGANISMS SEEN    Culture   Final    MODERATE KLEBSIELLA PNEUMONIAE CULTURE REINCUBATED FOR BETTER GROWTH Performed at [facility],  [place] [address] [place], [place] [address]    Report Status PENDING  Incomplete   Pertinent Lab.    Latest Ref Rng & Units 01/24/2022    4:52 AM 01/23/2022    5:08 AM 01/22/2022   12:17 PM  CBC  WBC 4.0 - 10.5 K/uL 9.6  10.1  15.7   Hemoglobin 12.0 - 15.0 g/dL 9.2  9.6  10.0   Hematocrit 36.0 - 46.0 % 28.2  29.7  30.7   Platelets 150 - 400 K/uL 231  217  261       Latest Ref Rng & Units 01/24/2022    4:52 AM 01/23/2022    5:08 AM 01/22/2022    4:00 PM  CMP  Glucose 70 - 99 mg/dL 841  324  401   BUN 6 - 20 mg/dL 17  12  10    Creatinine 0.44 - 1.00 mg/dL  0.27  2.53   Sodium 135 - 145 mmol/L 131  130  128   Potassium 3.5 - 5.1 mmol/L 4.5  4.2  3.7   Chloride 98 - 111 mmol/L 100  98  94   CO2 22 - 32 mmol/L 24  23  26    Calcium 8.9 - 10.3 mg/dL 8.1  8.2  8.5   Total Protein 6.5 - 8.1 g/dL  5.9    Total Bilirubin 0.3 - 1.2 mg/dL  0.9    Alkaline Phos 38 - 126 U/L  94    AST 15 - 41 U/L  16    ALT 0 - 44 U/L  7       Pertinent Imaging today Plain films and CT images have been personally visualized and interpreted; radiology reports have been reviewed. Decision making incorporated into the Impression / Recommendations.  No results found.   I spent 50 minutes for this patient encounter including review of prior medical records, coordination of care with primary/other specialist with greater than 50% of time being face to face/counseling and discussing diagnostics/treatment plan with the patient/family.  Electronically signed by:   6.64, MD Infectious Disease Physician [facility] for Infectious Disease Pager: [phone]

## 2022-01-24 NOTE — Progress Notes (Signed)
Subjective: CC: Known to our service.  Seen last month when admitted.  Patient with a history of a alcoholic cirrhosis of the liver (no alcohol use x 1 year) and pancreatic pseudocyst with extension into her left retroperitoneum.  There was concern for pancreatic duct stricture. At that time was not felt to be a surgical candidate due to cirrhosis with portal hypertension.  GI consulted. EUS by Dr. Rush Landmark - cystic lesion in the peripancreatic tail region; tissue was not able to been obtained safely due to the portal collaterals and splenule in the region; notes did report this appeared to have appearance suggestive of benign inflammatory changes consistent with pseudocyst or inflammatory phelgmon. IR aspirated.  She was discharged on 8/29 with oral antibiotics.  She has since required admission for ascites requiring paracentesis.  She appears to have a referral pending to tertiary care sending for definitive care of her pancreatic pseudocyst.  Patient reports on Monday, 9/18 she began noticing more severe left back pain as well as redness and thin drainage from her back (unsure what color but was not purulent). She had associated chills.  Denied fever, nausea, vomiting or abdominal pain.  CT A/P showed fluid collection extending the left posterior pararenal space through the abdominal wall into the subcu fat with suspected superimposed cellulitis.  Patient also had a large amount of ascites. Admitted for antibiotics.  GI consulted.  They were concerned for a possible distal pancreatic leak. They are considering pancreatic ERCP attempt to evaluate the distal pancreatic duct pending further workup. They had IR consult.  Patient underwent paracentesis of RLQ with 2.9 L of yellow fluid drained on 9/19.  Also underwent left posterior abdominal wall fluid collection aspiration with 52ml thick, bloody purulent fluid aspirated.  Cultures with Klebsiella pneumonia.  GI now planing abdominal MRI/MRCP with  and without contrast tomorrow to reevaluate the biliary duct and pancreatic pseudocyst.  We were asked to see to further evaluate her left flank area of erythema, heat and drainage.  Reports no alcohol use in the last 1 year.  History of prior cholecystectomy.  On 81 mg ASA daily but otherwise no blood thinners.  Objective: Vital signs in last 24 hours: Temp:  [97.5 F (36.4 C)-98 F (36.7 C)] 97.6 F (36.4 C) (09/20 0350) Pulse Rate:  [55-86] 55 (09/20 0350) Resp:  [13-20] 13 (09/20 0350) BP: (95-122)/(60-77) 122/70 (09/20 0350) SpO2:  [100 %] 100 % (09/20 0350) Last BM Date : 01/21/22  Intake/Output from previous day: 09/19 0701 - 09/20 0700 In: 1063 [P.O.:240; I.V.:223.3; IV Piggyback:599.7] Out: -  Intake/Output this shift: No intake/output data recorded.  PE: Gen:  Alert, NAD, pleasant Card:  RRR Pulm:  CTAB, no W/R/R, effort normal Abd: Soft, ND, NT, +BS.  Left flank with 10 x 6 cm area of blanchable erythema and heat.  There is a central area of fluctuance measuring 6 x 3 cm with multiple small pustules draining thin SS like fluid.  Her bandage however was saturated with very dark brown fluid  Ext:  No LE edema  Psych: A&Ox3       Lab Results:  Recent Labs    01/23/22 0508 01/24/22 0452  WBC 10.1 9.6  HGB 9.6* 9.2*  HCT 29.7* 28.2*  PLT 217 231   BMET Recent Labs    01/23/22 0508 01/24/22 0452  NA 130* 131*  K 4.2 4.5  CL 98 100  CO2 23 24  GLUCOSE 218* 233*  BUN 12  17  CREATININE 1.04* 1.08*  CALCIUM 8.2* 8.1*   PT/INR Recent Labs    01/22/22 1217 01/23/22 0508  LABPROT 17.8* 20.7*  INR 1.5* 1.8*   CMP     Component Value Date/Time   NA 131 (L) 01/24/2022 0452   NA 136 04/09/2013 0451   K 4.5 01/24/2022 0452   K 4.1 04/09/2013 0451   CL 100 01/24/2022 0452   CL 103 04/09/2013 0451   CO2 24 01/24/2022 0452   CO2 24 04/09/2013 0451   GLUCOSE 233 (H) 01/24/2022 0452   GLUCOSE 154 (H) 04/09/2013 0451   BUN 17 01/24/2022 0452   BUN  10 04/09/2013 0451   CREATININE 1.08 (H) 01/24/2022 0452   CREATININE 1.13 04/09/2013 0451   CALCIUM 8.1 (L) 01/24/2022 0452   CALCIUM 6.7 (LL) 04/09/2013 0451   PROT 5.9 (L) 01/23/2022 0508   PROT 5.3 (L) 04/09/2013 0451   ALBUMIN 2.2 (L) 01/23/2022 0508   ALBUMIN 2.5 (L) 04/09/2013 0451   AST 16 01/23/2022 0508   AST 62 (H) 04/09/2013 0451   ALT 7 01/23/2022 0508   ALT 82 (H) 04/09/2013 0451   ALKPHOS 94 01/23/2022 0508   ALKPHOS 46 04/09/2013 0451   BILITOT 0.9 01/23/2022 0508   BILITOT 1.0 04/09/2013 0451   GFRNONAA 59 (L) 01/24/2022 0452   GFRNONAA 57 (L) 04/09/2013 0451   GFRAA 54 (L) 10/28/2018 1629   GFRAA >60 04/09/2013 0451   Lipase     Component Value Date/Time   LIPASE 18 01/22/2022 1217   LIPASE 3,352 (H) 04/09/2013 0451    Studies/Results: US Paracentesis  Result Date: 01/23/2022 INDICATION: Patient with history of pancreatic pseudocyst, chronic pancreatitis, alcoholic cirrhosis, recurrent ascites. Request received for diagnostic and therapeutic paracentesis up to 3 liters. EXAM: ULTRASOUND GUIDED DIAGNOSTIC AND THERAPEUTIC PARACENTESIS MEDICATIONS: 10 ml 1% lidocaine COMPLICATIONS: None immediate. PROCEDURE: Informed written consent was obtained from the patient after a discussion of the risks, benefits and alternatives to treatment. A timeout was performed prior to the initiation of the procedure. Initial ultrasound scanning demonstrates a moderate amount of ascites within the right lower abdominal quadrant. The right lower abdomen was prepped and draped in the usual sterile fashion. 1% lidocaine was used for local anesthesia. Following this, a 19 gauge, 10-cm, Yueh catheter was introduced. An ultrasound image was saved for documentation purposes. The paracentesis was performed. The catheter was removed and a dressing was applied. The patient tolerated the procedure well without immediate post procedural complication. FINDINGS: A total of approximately 2.9 liters of  yellow fluid was removed. Samples were sent to the laboratory as requested by the clinical team. IMPRESSION: Successful ultrasound-guided diagnostic and therapeutic paracentesis yielding 2.9 liters of peritoneal fluid. PLAN: The patient has required >/=2 paracenteses in a 30 day period and a formal [facility] has been arranged. Read by: Jeananne RamaKevin Allred, PA-C Electronically Signed   By: Judie PetitM.  Shick M.D.   On: 01/23/2022 12:11   CT ABDOMEN PELVIS W CONTRAST  Result Date: 01/22/2022 CLINICAL DATA:  Abdominal pain, acute, nonlocalized. Left flank pain. History of pancreatic pseudocyst. EXAM: CT ABDOMEN AND PELVIS WITH CONTRAST TECHNIQUE: Multidetector CT imaging of the abdomen and pelvis was performed using the standard protocol following bolus administration of intravenous contrast. RADIATION DOSE REDUCTION: This exam was performed according to the departmental dose-optimization program which includes automated exposure control, adjustment of the mA and/or kV according to patient size and/or use of iterative reconstruction technique. CONTRAST:  OMNIPAQUE IOHEXOL 300 MG/ML  SOLN COMPARISON:  Abdominopelvic CT 01/10/2022 and 01/01/2022. FINDINGS: Lower chest: Similar atelectasis or scarring at both lung bases. No significant pleural or pericardial effusion. There is aortic and coronary artery atherosclerosis. Hepatobiliary: Contour irregularity of the liver and relative enlargement of the caudate and left lobes again noted, suspicious for cirrhosis. No focal liver lesions are identified. The gallbladder is surgically absent. There are chronic small calcified stones within the cystic duct remnant. Stable mild extrahepatic biliary dilatation without definite signs of choledocholithiasis. No intrahepatic biliary dilatation. Pancreas: Chronic pancreatic atrophy without evidence of acute inflammation or pancreatic ductal dilatation. Peripancreatic  collection adjacent to the pancreatic tail shows further decrease in size, now measuring 1.6 cm on image 33/2 (previously 2.2 cm). Spleen: Spleen size is stable at the upper limits of normal. Adrenals/Urinary Tract: Both adrenal glands appear normal. No evidence of urinary tract calculus, suspicious renal lesion or hydronephrosis. The bladder appears unremarkable for its degree of distention. Stomach/Bowel: No enteric contrast administered. The stomach appears unremarkable for its degree of distension. No evidence of bowel wall thickening, distention or surrounding inflammatory change. Scattered colonic diverticulosis. Vascular/Lymphatic: There are no enlarged abdominal or pelvic lymph nodes. No acute vascular findings are identified. Mild aortic and branch vessel atherosclerosis without aneurysm. There are multiple chronic portosystemic venous collaterals in the left upper quadrant of the abdomen. Reproductive: Small left fundal uterine fibroid.  No adnexal mass. Other: A large amount of ascites is again noted which is similar in volume to the most recent prior studies. A large peripherally enhancing complex fluid collection is again noted within the left posterior pararenal space, with posterior extension through the posterior left abdominal wall. This has irregular margins and is difficult to measure, but shows further decrease in size compared with the most recent study. It currently measures approximately 7.5 x 3.9 cm transverse on image 44/2 (previously 7.3 x 5.5 cm). On the coronal images, it measures up to 15.2 cm on image 110/4 (previously 14.8 cm). This may extend to the skin surface, and there is increased surrounding inflammation in the subcutaneous fat without soft tissue emphysema. No new or enlarging fluid collections are identified. Musculoskeletal: No acute or significant osseous findings. Mild spondylosis. No osseous destruction identified. IMPRESSION: 1. Interval slight decrease in size of complex  peripherally enhancing fluid collection extending from the left posterior pararenal space through the abdominal wall into the subcutaneous fat. This has been previously aspirated under ultrasound and is felt to reflect a pseudocyst. This may extend to the skin surface, and there appears to be increased surrounding subcutaneous edema which could reflect superimposed cellulitis. 2. No new or enlarging fluid collections are identified. There is a large amount of ascites which appears unchanged. 3. No acute abdominopelvic findings. 4. Hepatic cirrhosis with portal hypertension manifesting as multiple portosystemic collaterals. Electronically Signed   By: Carey Bullocks M.D.   On: 01/22/2022 13:23   DG Chest Port 1 View  Result Date: 01/22/2022 CLINICAL DATA:  Questionable sepsis.  Evaluate for abnormality. EXAM: PORTABLE CHEST 1 VIEW COMPARISON:  AP chest 08/07/2021 FINDINGS: Cardiac silhouette and mediastinal contours within normal limits. Moderate right mild left decreased lung volumes, mildly worsened from prior. Right basilar horizontal linear likely subsegmental atelectasis. No definite pleural effusion. No pneumothorax is seen. No acute skeletal abnormality. IMPRESSION: Low lung volumes with right basilar horizontal linear likely subsegmental atelectasis. It is difficult to entirely exclude underlying pneumonia, however that is felt less likely. Electronically Signed   By: Windy Fast  Viola M.D.   On: 01/22/2022 12:54    Anti-infectives: Anti-infectives (From admission, onward)    Start     Dose/Rate Route Frequency Ordered Stop   01/24/22 1400  vancomycin (VANCOREADY) IVPB 1250 mg/250 mL        1,250 mg 166.7 mL/hr over 90 Minutes Intravenous Every 24 hours 01/24/22 0819     01/23/22 1400  vancomycin (VANCOREADY) IVPB 1500 mg/300 mL  Status:  Discontinued        1,500 mg 150 mL/hr over 120 Minutes Intravenous Every 24 hours 01/22/22 1604 01/24/22 0819   01/22/22 1800  Ampicillin-Sulbactam (UNASYN) 3  g in sodium chloride 0.9 % 100 mL IVPB        3 g 200 mL/hr over 30 Minutes Intravenous Every 6 hours 01/22/22 1714     01/22/22 1345  cefTRIAXone (ROCEPHIN) 2 g in sodium chloride 0.9 % 100 mL IVPB        2 g 200 mL/hr over 30 Minutes Intravenous  Once 01/22/22 1342 01/22/22 1430   01/22/22 1345  vancomycin (VANCOREADY) IVPB 1250 mg/250 mL        1,250 mg 166.7 mL/hr over 90 Minutes Intravenous  Once 01/22/22 1342 01/23/22 0435        Assessment/Plan Pancreatic pseudocyst with extension into her left retroperitoneum now with redness/heat/drainage of the left flank History alcoholic cirrhosis of the liver (no alcohol use x 1 year)  - Hx as above - CT A/P showed fluid collection extending the left posterior pararenal space through the abdominal wall into the subcu fat with suspected superimposed cellulitis.   - Underwent left posterior abdominal wall fluid collection aspiration with 85ml thick, bloody purulent fluid aspirated.  Cultures with Klebsiella pneumonia. On abx per primary. - On exam appears to have cellulitis and an area of fluctuance. Discussed with my attending who discussed with one of our hepatobiliary surgeons. Recommends I&D in the OR. She has already eaten today. Will make npo at midnight and discuss with patient to obtain consent. Recheck INR in AM (1.8 yesterday) - GI concerned for possible distal pancreatic leak. They are obtaining MRI/MRCP tomorrow and considering pancreatic ERCP attempt to evaluate the distal pancreatic duct pending further workup. Appreciate their assistance.   FEN - Okay for diet today. NPO midnight. IVF per [facility] VTE - SCDs ID - On Unasyn/Vanc Foley - None, voiding  DM2 HTN  I reviewed nursing notes, ED provider notes, Consultant GI/IR notes, last 24 h vitals and pain scores, last 48 h intake and output, last 24 h labs and trends, and last 24 h imaging results.   LOS: 2 days    Jacinto Halim , [facility] 01/24/2022,  9:34 AM Please see Amion for pager number during day hours 7:00am-4:30pm

## 2022-01-25 ENCOUNTER — Encounter (HOSPITAL_COMMUNITY)
Admission: EM | Disposition: A | Discharge status: Home / Self Care | Payer: Self-pay | Source: Home / Self Care | Attending: Internal Medicine

## 2022-01-25 ENCOUNTER — Inpatient Hospital Stay (HOSPITAL_COMMUNITY): Payer: 59 | Admitting: Anesthesiology

## 2022-01-25 ENCOUNTER — Other Ambulatory Visit: Payer: Self-pay

## 2022-01-25 ENCOUNTER — Encounter (HOSPITAL_COMMUNITY): Payer: Self-pay | Admitting: Internal Medicine

## 2022-01-25 ENCOUNTER — Inpatient Hospital Stay (HOSPITAL_COMMUNITY): Payer: 59

## 2022-01-25 DIAGNOSIS — Z794 Long term (current) use of insulin: Secondary | ICD-10-CM

## 2022-01-25 DIAGNOSIS — R6520 Severe sepsis without septic shock: Secondary | ICD-10-CM | POA: Diagnosis not present

## 2022-01-25 DIAGNOSIS — E119 Type 2 diabetes mellitus without complications: Secondary | ICD-10-CM

## 2022-01-25 DIAGNOSIS — K8689 Other specified diseases of pancreas: Secondary | ICD-10-CM | POA: Diagnosis not present

## 2022-01-25 DIAGNOSIS — A419 Sepsis, unspecified organism: Secondary | ICD-10-CM | POA: Diagnosis not present

## 2022-01-25 DIAGNOSIS — K863 Pseudocyst of pancreas: Secondary | ICD-10-CM | POA: Diagnosis not present

## 2022-01-25 DIAGNOSIS — I10 Essential (primary) hypertension: Secondary | ICD-10-CM

## 2022-01-25 DIAGNOSIS — L02211 Cutaneous abscess of abdominal wall: Secondary | ICD-10-CM

## 2022-01-25 DIAGNOSIS — K860 Alcohol-induced chronic pancreatitis: Secondary | ICD-10-CM

## 2022-01-25 DIAGNOSIS — R935 Abnormal findings on diagnostic imaging of other abdominal regions, including retroperitoneum: Secondary | ICD-10-CM | POA: Diagnosis not present

## 2022-01-25 DIAGNOSIS — L02212 Cutaneous abscess of back [any part, except buttock]: Secondary | ICD-10-CM

## 2022-01-25 HISTORY — PX: INCISION AND DRAINAGE ABSCESS: SHX5864

## 2022-01-25 LAB — HEPATIC FUNCTION PANEL
ALT: 10 U/L (ref 0–44)
AST: 24 U/L (ref 15–41)
Albumin: 2.6 g/dL — ABNORMAL LOW (ref 3.5–5.0)
Alkaline Phosphatase: 121 U/L (ref 38–126)
Bilirubin, Direct: 0.2 mg/dL (ref 0.0–0.2)
Indirect Bilirubin: 0.3 mg/dL (ref 0.3–0.9)
Total Bilirubin: 0.5 mg/dL (ref 0.3–1.2)
Total Protein: 6.3 g/dL — ABNORMAL LOW (ref 6.5–8.1)

## 2022-01-25 LAB — CBC
HCT: 36 % (ref 36.0–46.0)
Hemoglobin: 11.3 g/dL — ABNORMAL LOW (ref 12.0–15.0)
MCH: 28.7 pg (ref 26.0–34.0)
MCHC: 31.4 g/dL (ref 30.0–36.0)
MCV: 91.4 fL (ref 80.0–100.0)
Platelets: 353 10*3/uL (ref 150–400)
RBC: 3.94 MIL/uL (ref 3.87–5.11)
RDW: 13.2 % (ref 11.5–15.5)
WBC: 12 10*3/uL — ABNORMAL HIGH (ref 4.0–10.5)
nRBC: 0 % (ref 0.0–0.2)

## 2022-01-25 LAB — BASIC METABOLIC PANEL
Anion gap: 9 (ref 5–15)
BUN: 16 mg/dL (ref 6–20)
CO2: 21 mmol/L — ABNORMAL LOW (ref 22–32)
Calcium: 8.5 mg/dL — ABNORMAL LOW (ref 8.9–10.3)
Chloride: 104 mmol/L (ref 98–111)
Creatinine, Ser: 1.13 mg/dL — ABNORMAL HIGH (ref 0.44–1.00)
GFR, Estimated: 56 mL/min — ABNORMAL LOW (ref >60)
Glucose, Bld: 168 mg/dL — ABNORMAL HIGH (ref 70–99)
Potassium: 3.7 mmol/L (ref 3.5–5.1)
Sodium: 134 mmol/L — ABNORMAL LOW (ref 135–145)

## 2022-01-25 LAB — MAGNESIUM: Magnesium: 1.8 mg/dL (ref 1.7–2.4)

## 2022-01-25 LAB — GLUCOSE, CAPILLARY
Glucose-Capillary: 155 mg/dL — ABNORMAL HIGH (ref 70–99)
Glucose-Capillary: 109 mg/dL — ABNORMAL HIGH (ref 70–99)
Glucose-Capillary: 177 mg/dL — ABNORMAL HIGH (ref 70–99)
Glucose-Capillary: 117 mg/dL — ABNORMAL HIGH (ref 70–99)
Glucose-Capillary: 290 mg/dL — ABNORMAL HIGH (ref 70–99)

## 2022-01-25 LAB — UREA NITROGEN, URINE: Urea Nitrogen, Ur: 150 mg/dL

## 2022-01-25 LAB — SURGICAL PCR SCREEN
MRSA, PCR: NEGATIVE
Staphylococcus aureus: NEGATIVE

## 2022-01-25 LAB — PROTIME-INR
INR: 1.2 (ref 0.8–1.2)
Prothrombin Time: 15.5 seconds — ABNORMAL HIGH (ref 11.4–15.2)

## 2022-01-25 SURGERY — INCISION AND DRAINAGE, ABSCESS
Anesthesia: General

## 2022-01-25 MED ORDER — MIDAZOLAM HCL 2 MG/2ML IJ SOLN
INTRAMUSCULAR | Status: AC
  Filled 2022-01-25: qty 2

## 2022-01-25 MED ORDER — ROCURONIUM BROMIDE 10 MG/ML (PF) SYRINGE
PREFILLED_SYRINGE | INTRAVENOUS | Status: AC
  Filled 2022-01-25: qty 10

## 2022-01-25 MED ORDER — OXYCODONE HCL 5 MG PO TABS: 5.0000 mg | ORAL_TABLET | Freq: Once | ORAL | Status: DC | PRN

## 2022-01-25 MED ORDER — PROPOFOL 10 MG/ML IV BOLUS
INTRAVENOUS | Status: DC | PRN
  Administered 2022-01-25: 120 mg via INTRAVENOUS

## 2022-01-25 MED ORDER — SUGAMMADEX SODIUM 200 MG/2ML IV SOLN
INTRAVENOUS | Status: DC | PRN
  Administered 2022-01-25: 150 mg via INTRAVENOUS

## 2022-01-25 MED ORDER — TRAMADOL HCL 50 MG PO TABS
50.0000 mg | ORAL_TABLET | Freq: Four times a day (QID) | ORAL | Status: DC | PRN
  Administered 2022-01-26 – 2022-01-29 (×6): 100 mg via ORAL
  Filled 2022-01-25 (×7): qty 2

## 2022-01-25 MED ORDER — FENTANYL CITRATE (PF) 100 MCG/2ML IJ SOLN
INTRAMUSCULAR | Status: AC
  Filled 2022-01-25: qty 2

## 2022-01-25 MED ORDER — PROMETHAZINE HCL 25 MG/ML IJ SOLN: 6.2500 mg | INTRAMUSCULAR | Status: DC | PRN

## 2022-01-25 MED ORDER — ONDANSETRON HCL 4 MG/2ML IJ SOLN
INTRAMUSCULAR | Status: AC
  Filled 2022-01-25: qty 2

## 2022-01-25 MED ORDER — BUPIVACAINE-EPINEPHRINE (PF) 0.25% -1:200000 IJ SOLN
INTRAMUSCULAR | Status: DC | PRN
  Administered 2022-01-25: 30 mL

## 2022-01-25 MED ORDER — BUPIVACAINE LIPOSOME 1.3 % IJ SUSP
INTRAMUSCULAR | Status: AC
  Filled 2022-01-25: qty 20

## 2022-01-25 MED ORDER — SODIUM CHLORIDE 0.9 % IV SOLN: INTRAVENOUS | Status: DC

## 2022-01-25 MED ORDER — MIDAZOLAM HCL 5 MG/5ML IJ SOLN
INTRAMUSCULAR | Status: DC | PRN
  Administered 2022-01-25: 2 mg via INTRAVENOUS

## 2022-01-25 MED ORDER — ACETAMINOPHEN 500 MG PO TABS: 1000.0000 mg | ORAL_TABLET | Freq: Once | ORAL | Status: DC

## 2022-01-25 MED ORDER — DEXAMETHASONE SODIUM PHOSPHATE 10 MG/ML IJ SOLN
INTRAMUSCULAR | Status: DC | PRN
  Administered 2022-01-25: 10 mg via INTRAVENOUS

## 2022-01-25 MED ORDER — SPIRONOLACTONE 25 MG PO TABS
25.0000 mg | ORAL_TABLET | Freq: Every day | ORAL | Status: DC
  Administered 2022-01-25 – 2022-01-28 (×4): 25 mg via ORAL
  Filled 2022-01-25 (×5): qty 1

## 2022-01-25 MED ORDER — GADOBUTROL 1 MMOL/ML IV SOLN
7.0000 mL | Freq: Once | INTRAVENOUS | Status: AC | PRN
  Administered 2022-01-25: 7 mL via INTRAVENOUS

## 2022-01-25 MED ORDER — MIDAZOLAM HCL 2 MG/2ML IJ SOLN: 0.5000 mg | Freq: Once | INTRAMUSCULAR | Status: DC | PRN

## 2022-01-25 MED ORDER — HYDROMORPHONE HCL 1 MG/ML IJ SOLN: 0.2500 mg | INTRAMUSCULAR | Status: DC | PRN

## 2022-01-25 MED ORDER — DEXAMETHASONE SODIUM PHOSPHATE 10 MG/ML IJ SOLN
INTRAMUSCULAR | Status: AC
  Filled 2022-01-25: qty 1

## 2022-01-25 MED ORDER — MORPHINE SULFATE (PF) 2 MG/ML IV SOLN
2.0000 mg | INTRAVENOUS | Status: DC | PRN
  Administered 2022-01-26 – 2022-01-29 (×6): 2 mg via INTRAVENOUS
  Filled 2022-01-25 (×6): qty 1

## 2022-01-25 MED ORDER — OXYCODONE HCL 5 MG/5ML PO SOLN: 5.0000 mg | Freq: Once | ORAL | Status: DC | PRN

## 2022-01-25 MED ORDER — ONDANSETRON HCL 4 MG/2ML IJ SOLN
INTRAMUSCULAR | Status: DC | PRN
  Administered 2022-01-25: 4 mg via INTRAVENOUS

## 2022-01-25 MED ORDER — LIDOCAINE HCL (PF) 2 % IJ SOLN
INTRAMUSCULAR | Status: AC
  Filled 2022-01-25: qty 5

## 2022-01-25 MED ORDER — LACTATED RINGERS IV SOLN: INTRAVENOUS | Status: DC | PRN

## 2022-01-25 MED ORDER — LIDOCAINE HCL (CARDIAC) PF 100 MG/5ML IV SOSY
PREFILLED_SYRINGE | INTRAVENOUS | Status: DC | PRN
  Administered 2022-01-25: 80 mg via INTRAVENOUS

## 2022-01-25 MED ORDER — ROCURONIUM BROMIDE 100 MG/10ML IV SOLN
INTRAVENOUS | Status: DC | PRN
  Administered 2022-01-25: 50 mg via INTRAVENOUS

## 2022-01-25 MED ORDER — FUROSEMIDE 20 MG PO TABS
20.0000 mg | ORAL_TABLET | ORAL | Status: DC
  Administered 2022-01-25 – 2022-01-29 (×3): 20 mg via ORAL
  Filled 2022-01-25 (×4): qty 1

## 2022-01-25 MED ORDER — PHENYLEPHRINE 80 MCG/ML (10ML) SYRINGE FOR IV PUSH (FOR BLOOD PRESSURE SUPPORT)
PREFILLED_SYRINGE | INTRAVENOUS | Status: DC | PRN
  Administered 2022-01-25 (×2): 160 ug via INTRAVENOUS

## 2022-01-25 MED ORDER — METHOCARBAMOL 500 MG PO TABS
500.0000 mg | ORAL_TABLET | Freq: Four times a day (QID) | ORAL | Status: DC | PRN
  Administered 2022-01-26 – 2022-01-29 (×3): 500 mg via ORAL
  Filled 2022-01-25 (×3): qty 1

## 2022-01-25 MED ORDER — PROPOFOL 10 MG/ML IV BOLUS
INTRAVENOUS | Status: AC
  Filled 2022-01-25: qty 20

## 2022-01-25 MED ORDER — MEPERIDINE HCL 50 MG/ML IJ SOLN: 6.2500 mg | INTRAMUSCULAR | Status: DC | PRN

## 2022-01-25 MED ORDER — BUPIVACAINE-EPINEPHRINE (PF) 0.25% -1:200000 IJ SOLN
INTRAMUSCULAR | Status: AC
  Filled 2022-01-25: qty 30

## 2022-01-25 MED ORDER — FENTANYL CITRATE (PF) 100 MCG/2ML IJ SOLN
INTRAMUSCULAR | Status: DC | PRN
  Administered 2022-01-25: 100 ug via INTRAVENOUS

## 2022-01-25 SURGICAL SUPPLY — 26 items
BAG COUNTER SPONGE SURGICOUNT (BAG) IMPLANT
BLADE SURG SZ11 CARB STEEL (BLADE) ×1 IMPLANT
CHLORAPREP W/TINT 26 (MISCELLANEOUS) ×1 IMPLANT
COVER SURGICAL LIGHT HANDLE (MISCELLANEOUS) ×1 IMPLANT
DRAPE LAPAROSCOPIC ABDOMINAL (DRAPES) IMPLANT
DRAPE LAPAROTOMY TRNSV 102X78 (DRAPES) IMPLANT
ELECT REM PT RETURN 15FT ADLT (MISCELLANEOUS) ×1 IMPLANT
GAUZE PAD ABD 8X10 STRL (GAUZE/BANDAGES/DRESSINGS) IMPLANT
GAUZE SPONGE 4X4 12PLY STRL (GAUZE/BANDAGES/DRESSINGS) IMPLANT
GLOVE BIOGEL PI IND STRL 7.0 (GLOVE) ×1 IMPLANT
GLOVE SURG SS PI 7.0 STRL IVOR (GLOVE) ×1 IMPLANT
GOWN STRL REUS W/ TWL LRG LVL3 (GOWN DISPOSABLE) ×1 IMPLANT
GOWN STRL REUS W/ TWL XL LVL3 (GOWN DISPOSABLE) IMPLANT
GOWN STRL REUS W/TWL LRG LVL3 (GOWN DISPOSABLE) ×1
GOWN STRL REUS W/TWL XL LVL3 (GOWN DISPOSABLE)
KIT BASIN OR (CUSTOM PROCEDURE TRAY) ×1 IMPLANT
KIT TURNOVER KIT A (KITS) IMPLANT
PACK GENERAL/GYN (CUSTOM PROCEDURE TRAY) ×1 IMPLANT
SPIKE FLUID TRANSFER (MISCELLANEOUS) IMPLANT
SURGILUBE 2OZ TUBE FLIPTOP (MISCELLANEOUS) IMPLANT
SUT MNCRL AB 4-0 PS2 18 (SUTURE) IMPLANT
SWAB COLLECTION DEVICE MRSA (MISCELLANEOUS) IMPLANT
SWAB CULTURE ESWAB REG 1ML (MISCELLANEOUS) IMPLANT
SYR 10ML ECCENTRIC (SYRINGE) IMPLANT
TOWEL OR 17X26 10 PK STRL BLUE (TOWEL DISPOSABLE) ×1 IMPLANT
TOWEL OR NON WOVEN STRL DISP B (DISPOSABLE) ×1 IMPLANT

## 2022-01-25 NOTE — Anesthesia Preprocedure Evaluation (Addendum)
Anesthesia Evaluation  Patient identified by MRN, date of birth, ID band Patient awake    Reviewed: Allergy & Precautions, NPO status , Patient's Chart, lab work & pertinent test results  History of Anesthesia Complications Negative for: history of anesthetic complications  Airway Mallampati: II  TM Distance: >3 FB Neck ROM: Full    Dental  (+) Dental Advisory Given, Teeth Intact   Pulmonary neg pulmonary ROS,    breath sounds clear to auscultation       Cardiovascular hypertension, Pt. on medications (-) angina Rhythm:Regular Rate:Normal  '20 ECHO: EF 55-60%. The cavity size was normal. LV diastolic parameters were normal. RV has normal systolic function, no significant valvular abnormalities   Neuro/Psych Anxiety negative neurological ROS     GI/Hepatic GERD  Medicated and Controlled,H/o ETOH induced pancreatitis   Endo/Other  diabetes (glu 109), Insulin Dependent  Renal/GU negative Renal ROS     Musculoskeletal  (+) Arthritis ,   Abdominal   Peds  Hematology  (+) Blood dyscrasia (Hb 11.3), anemia ,   Anesthesia Other Findings   Reproductive/Obstetrics                            Anesthesia Physical Anesthesia Plan  ASA: 3  Anesthesia Plan: General   Post-op Pain Management: Tylenol PO (pre-op)*   Induction: Intravenous  PONV Risk Score and Plan: 3 and Ondansetron, Dexamethasone and Scopolamine patch - Pre-op  Airway Management Planned: Oral ETT  Additional Equipment: None  Intra-op Plan:   Post-operative Plan: Extubation in OR  Informed Consent: I have reviewed the patients History and Physical, chart, labs and discussed the procedure including the risks, benefits and alternatives for the proposed anesthesia with the patient or authorized representative who has indicated his/her understanding and acceptance.     Dental advisory given  Plan Discussed with: CRNA and  Surgeon  Anesthesia Plan Comments:        Anesthesia Quick Evaluation

## 2022-01-25 NOTE — Anesthesia Postprocedure Evaluation (Signed)
Anesthesia Post Note  Patient: Mallory Stewart  Procedure(s) Performed: INCISION AND DRAINAGE BACK ABSCESS     Patient location during evaluation: PACU Anesthesia Type: General Level of consciousness: awake and alert, patient cooperative and oriented Pain management: pain level controlled Respiratory status: nonlabored ventilation, spontaneous breathing and respiratory function stable Cardiovascular status: blood pressure returned to baseline and stable Postop Assessment: no apparent nausea or vomiting Anesthetic complications: no   No notable events documented.  Last Vitals:  Vitals:   01/25/22 1610 01/25/22 1622  BP:  (!) 149/99  Pulse: 91 82  Resp: 15 16  Temp:    SpO2: 97% 100%    Last Pain:  Vitals:   01/25/22 1645  TempSrc:   PainSc: 5                  [place] Pulse,E. Keeana Pieratt

## 2022-01-25 NOTE — Progress Notes (Signed)
Progress Note  Primary GI: Dr. Leone Payor   Subjective  Chief Complaint: Pancreatic pseudocyst  Patient lying in bed, states she is feeling improved from yesterday.  Abdominal pain is still there but not as much pressure.  Patient had 3 bowel movements without any hematochezia, melena, well formed. Continues to have swelling in legs, up to her thighs, some swelling in abdomen but states this also feels slightly better.  Denies fever chills, nausea vomiting.    Objective   Vital signs in last 24 hours: Temp:  [97.7 F (36.5 C)-99 F (37.2 C)] 97.7 F (36.5 C) (09/21 0519) Pulse Rate:  [86-92] 92 (09/21 0519) Resp:  [20] 20 (09/21 0519) BP: (125-152)/(78-96) 152/96 (09/21 0519) SpO2:  [100 %] 100 % (09/21 0519) Last BM Date : 01/21/22 Last BM recorded by nurses in past 5 days Stool Type: Type 3 (Sausage shape with surface cracks) (01/25/2022 10:00 AM)  General: 61 year old female sitting up in the chair in no acute distress. Heart: Regular rate and rhythm, no murmurs. Pulm: Breath sounds clear. Abdomen: Soft, nondistended.  Nontender.  Positive bowel sounds all 4 quadrants.  Left posterior flank dressing intact, surrounding area is erythematous with a scant amount of purulent matter from the small pustules. Extremities:  With pitted edema mild to level of the thigh. Neurologic:  Alert and  oriented x 4. Grossly normal neurologically. Psych:  Alert and cooperative. Normal mood and affect.  Intake/Output from previous day: 09/20 0701 - 09/21 0700 In: 2249.3 [P.O.:840; I.V.:659.2; IV Piggyback:750.1] Out: -  Intake/Output this shift: No intake/output data recorded.  Studies/Results: MR ABDOMEN MRCP W WO CONTAST  Result Date: 01/25/2022 CLINICAL DATA:  Abdominal distention. Cirrhosis and portal venous hypertension. Chronic pancreatitis with infected pseudocyst. Recent aspiration of left abdominal wall fluid collection. EXAM: MRI ABDOMEN WITHOUT AND WITH CONTRAST (INCLUDING MRCP)  TECHNIQUE: Multiplanar multisequence MR imaging of the abdomen was performed both before and after the administration of intravenous contrast. Heavily T2-weighted images of the biliary and pancreatic ducts were obtained, and three-dimensional MRCP images were rendered by post processing. CONTRAST:  71mL GADAVIST GADOBUTROL 1 MMOL/ML IV SOLN COMPARISON:  CT on 01/22/2022 FINDINGS: Lower chest: No acute findings. Hepatobiliary: Hepatic cirrhosis is demonstrated. No hepatic masses identified. Large amount of ascites is seen as well as diffuse mesenteric and body wall edema. Prior cholecystectomy. Biliary ductal dilatation is seen with common bile duct measuring 17 mm in diameter. Stricture of the distal common bile duct is seen within the pancreatic head. Pancreas: No pancreatic mass identified. Pancreas is atrophic. Previously seen large peripancreatic fluid collection adjacent to the peripancreatic tail which extends into the adjacent left posterolateral abdominal wall soft tissues has significantly decreased in size since previous study. Residual rim enhancing fluid collection seen in the left posterior abdominal wall subcutaneous tissues measures 5.3 x 3.0 cm. Spleen: Splenomegaly is again seen, consistent portal venous hypertension. Adrenals/Urinary Tract: No suspicious masses identified. No evidence of hydronephrosis. Stomach/Bowel: Unremarkable. Vascular/Lymphatic: No pathologically enlarged lymph nodes identified. Recanalization of paraumbilical veins and portosystemic venous collaterals are seen as well as varices at the GE junction, consistent with portal venous hypertension. No acute vascular findings. Other:  None. Musculoskeletal:  No suspicious bone lesions identified. IMPRESSION: Near-complete resolution of left abdominal pancreatic pseudocyst. Residual complex fluid collection in the left posterior abdominal wall soft tissues measures 5.3 x 3.0 cm. Hepatic cirrhosis and portal venous hypertension  resulting in splenomegaly and gastroesophageal varices. No evidence of hepatic neoplasm. Large amount of ascites, with diffuse  mesenteric and body wall edema. Diffuse biliary ductal dilatation with stricture within the pancreatic head. No radiographic signs of pancreatic neoplasm. Electronically Signed   By: Marlaine Hind M.D.   On: 01/25/2022 09:11   MR 3D Recon At Scanner  Result Date: 01/25/2022 CLINICAL DATA:  Abdominal distention. Cirrhosis and portal venous hypertension. Chronic pancreatitis with infected pseudocyst. Recent aspiration of left abdominal wall fluid collection. EXAM: MRI ABDOMEN WITHOUT AND WITH CONTRAST (INCLUDING MRCP) TECHNIQUE: Multiplanar multisequence MR imaging of the abdomen was performed both before and after the administration of intravenous contrast. Heavily T2-weighted images of the biliary and pancreatic ducts were obtained, and three-dimensional MRCP images were rendered by post processing. CONTRAST:  49mL GADAVIST GADOBUTROL 1 MMOL/ML IV SOLN COMPARISON:  CT on 01/22/2022 FINDINGS: Lower chest: No acute findings. Hepatobiliary: Hepatic cirrhosis is demonstrated. No hepatic masses identified. Large amount of ascites is seen as well as diffuse mesenteric and body wall edema. Prior cholecystectomy. Biliary ductal dilatation is seen with common bile duct measuring 17 mm in diameter. Stricture of the distal common bile duct is seen within the pancreatic head. Pancreas: No pancreatic mass identified. Pancreas is atrophic. Previously seen large peripancreatic fluid collection adjacent to the peripancreatic tail which extends into the adjacent left posterolateral abdominal wall soft tissues has significantly decreased in size since previous study. Residual rim enhancing fluid collection seen in the left posterior abdominal wall subcutaneous tissues measures 5.3 x 3.0 cm. Spleen: Splenomegaly is again seen, consistent portal venous hypertension. Adrenals/Urinary Tract: No suspicious  masses identified. No evidence of hydronephrosis. Stomach/Bowel: Unremarkable. Vascular/Lymphatic: No pathologically enlarged lymph nodes identified. Recanalization of paraumbilical veins and portosystemic venous collaterals are seen as well as varices at the GE junction, consistent with portal venous hypertension. No acute vascular findings. Other:  None. Musculoskeletal:  No suspicious bone lesions identified. IMPRESSION: Near-complete resolution of left abdominal pancreatic pseudocyst. Residual complex fluid collection in the left posterior abdominal wall soft tissues measures 5.3 x 3.0 cm. Hepatic cirrhosis and portal venous hypertension resulting in splenomegaly and gastroesophageal varices. No evidence of hepatic neoplasm. Large amount of ascites, with diffuse mesenteric and body wall edema. Diffuse biliary ductal dilatation with stricture within the pancreatic head. No radiographic signs of pancreatic neoplasm. Electronically Signed   By: Marlaine Hind M.D.   On: 01/25/2022 09:11   IR US Guide Bx Asp/Drain  Result Date: 01/24/2022 INDICATION: 61 year old with pancreatic pseudocyst that is extending through the posterior left abdominal wall with fluid weeping from the skin. EXAM: ULTRASOUND-GUIDED ASPIRATION OF LEFT POSTERIOR ABDOMINAL WALL FLUID COLLECTION MEDICATIONS: 1% lidocaine for local anesthetic ANESTHESIA/SEDATION: None COMPLICATIONS: None immediate. PROCEDURE: Informed written consent was obtained from the patient after a thorough discussion of the procedural risks, benefits and alternatives. A timeout was performed prior to the initiation of the procedure. Patient was placed prone. Patient has a visible and palpable abnormality along the left posterior abdomen which is draining watery yellow fluid. This area was evaluated with ultrasound. This area was prepped with Betadine and sterile field was created. Skin was anesthetized along the medial aspect of the left back. Using ultrasound guidance, a  19 gauge Yueh catheter was directed into the heterogeneous collection just below the skin surface. Initially, no significant fluid could be aspirated. The Yueh catheter was manipulated multiple times until eventually 15 mL bloody thick fluid was aspirated. No additional fluid could be aspirated. In addition, large amount of watery yellow fluid was expressed from the small pustules on the skin surface. Yueh catheter was removed.  Bandage placed over the puncture site. FINDINGS: The area of concern is the left flank just posterior to the left kidney and involving the posterior abdominal wall. Focal skin erythema with small yellow pustules that are weeping watery yellow fluid. Ultrasound demonstrated a heterogeneous collection just below the skin surface and superficial to the left kidney. Comparing with the ultrasound-guided aspiration from 12/29/2021, it appears that the fluid is more hyperechoic and probably more complex. Only 15 mL of thick bloody fluid could be aspirated from this area. Large amount of watery yellow fluid was expressed from the small pustules on the skin surface. Overall, this area of inflammation along the left flank appeared to be decompressed or less distended by the end of the procedure. IMPRESSION: Ultrasound-guided aspiration of the complex thick fluid involving the left posterior abdominal wall. Based on the prior imaging findings, this fluid collection is concerning for a complex pseudocyst. Fluid was sent for culture, amylase and lipase. Electronically Signed   By: Richarda Overlie M.D.   On: 01/24/2022 09:55    Lab Results: Recent Labs    01/23/22 0508 01/24/22 0452 01/25/22 0509  WBC 10.1 9.6 12.0*  HGB 9.6* 9.2* 11.3*  HCT 29.7* 28.2* 36.0  PLT 217 231 353   BMET Recent Labs    01/23/22 0508 01/24/22 0452 01/25/22 0509  NA 130* 131* 134*  K 4.2 4.5 3.7  CL 98 100 104  CO2 23 24 21*  GLUCOSE 218* 233* 168*  BUN 12 17 16   CREATININE 1.04* 1.08* 1.13*  CALCIUM 8.2* 8.1*  8.5*   LFT Recent Labs    01/25/22 0509  PROT 6.3*  ALBUMIN 2.6*  AST 24  ALT 10  ALKPHOS 121  BILITOT 0.5  BILIDIR 0.2  IBILI 0.3   PT/INR Recent Labs    01/23/22 0508 01/25/22 0509  LABPROT 20.7* 15.5*  INR 1.8* 1.2      Patient profile:   Very complicated patient with alcohol associated chronic pancreatitis, peripancreatic/retroperitoneal infected pseudocysts status post aspiration readmitted to the hospital with sepsis secondary cellulitis from the aspiration site. S/P ultrasound-guided aspiration of the left posterior abdominal wall collection by interventional radiology 01/23/2022. prior to aspiration and a moderate amount of fluid drained externally by compressing this area manually.  15 mL of thick bloody purulent aspirated fluid was sent for analysis, culture/lipase results pending.    Impression/Plan:   Chronic pancreatitis with peripancreatic and retroperitoneal infected pseudocyst ID following Unasyn vancomycin CBC with slight elevation white blood cell count, hemoglobin improved, kidney liver stable. General surgery planning I&D of abscess with the back MRCP completed, Dr. 01/25/2022 will discuss with the patient options for ERCP to rule out pancreatic duct leak.  Cirrhosis secondary to alcohol with history of esophageal and gastric varices, ascites Status post paracentesis 2.9 L no SBP Difficult fluid status with history of hyponatremia secondary to diuretics MELD 3.0: 14 at 01/25/2022  5:09 AM MELD-Na: 9 at 01/25/2022  5:09 AM On Lasix 20 mg and spironolactone 25 mg daily, kidney function and sodium holding steady, question being able to increase spironolactone to 50mg   Chronic normocytic anemia CBC on 01/25/2022   WBC 12.0 HGB 11.3 MCV 91.4 Platelets 353 No overt GI bleeding  Hyponatremia NA 134     LOS: 3 days    01/25/2022, 11:37 AM

## 2022-01-25 NOTE — Transfer of Care (Signed)
Immediate Anesthesia Transfer of Care Note  Patient: Mallory Stewart  Procedure(s) Performed: INCISION AND DRAINAGE BACK ABSCESS  Patient Location: PACU  Anesthesia Type:General  Level of Consciousness: awake, alert  and oriented  Airway & Oxygen Therapy: Patient Spontanous Breathing and Patient connected to face mask oxygen  Post-op Assessment: Report given to RN and Post -op Vital signs reviewed and stable  Post vital signs: Reviewed and stable  Last Vitals:  Vitals Value Taken Time  BP 139/93 01/25/22 1540  Temp    Pulse    Resp 17 01/25/22 1542  SpO2    Vitals shown include unvalidated device data.  Last Pain:  Vitals:   01/25/22 0853  TempSrc:   PainSc: 2       Patients Stated Pain Goal: 2 (15/94/58 5929)  Complications: No notable events documented.

## 2022-01-25 NOTE — Progress Notes (Signed)
Pre Procedure note for inpatients:   Mallory Stewart has been scheduled for Procedure(s) with comments: INCISION AND DRAINAGE BACK ABSCESS (N/A) - 90 L DOW today. The various methods of treatment have been discussed with the patient. After consideration of the risks, benefits and treatment options the patient has consented to the planned procedure.   The patient has been seen and labs reviewed. There are no changes in the patient's condition to prevent proceeding with the planned procedure today.  Recent labs:  Lab Results  Component Value Date   WBC 12.0 (H) 01/25/2022   HGB 11.3 (L) 01/25/2022   HCT 36.0 01/25/2022   PLT 353 01/25/2022   GLUCOSE 168 (H) 01/25/2022   CHOL 168 06/16/2020   TRIG 236.0 (H) 06/16/2020   HDL 18.10 (L) 06/16/2020   LDLDIRECT 129.0 06/16/2020   LDLCALC 82 10/16/2018   ALT 10 01/25/2022   AST 24 01/25/2022   NA 134 (L) 01/25/2022   K 3.7 01/25/2022   CL 104 01/25/2022   CREATININE 1.13 (H) 01/25/2022   BUN 16 01/25/2022   CO2 21 (L) 01/25/2022   TSH 8.744 (H) 01/22/2022   INR 1.2 01/25/2022   HGBA1C 6.0 (A) 01/17/2022   MICROALBUR 1.6 08/19/2019    Mickeal Skinner, MD 01/25/2022 8:35 AM

## 2022-01-25 NOTE — Anesthesia Procedure Notes (Signed)
Procedure Name: Intubation Date/Time: 01/25/2022 3:12 PM  Performed by: Gean Maidens, CRNAPre-anesthesia Checklist: Emergency Drugs available, Patient identified, Patient being monitored, Suction available and Timeout performed Patient Re-evaluated:Patient Re-evaluated prior to induction Oxygen Delivery Method: Circle system utilized Preoxygenation: Pre-oxygenation with 100% oxygen Induction Type: IV induction Ventilation: Mask ventilation without difficulty Laryngoscope Size: Mac and 3 Grade View: Grade I Tube type: Oral Tube size: 7.0 mm Number of attempts: 1 Airway Equipment and Method: Stylet Placement Confirmation: ETT inserted through vocal cords under direct vision, positive ETCO2 and breath sounds checked- equal and bilateral Secured at: 21 cm Tube secured with: Tape Dental Injury: Teeth and Oropharynx as per pre-operative assessment

## 2022-01-25 NOTE — Progress Notes (Signed)
PROGRESS NOTE  Mallory Stewart  ION:[id number] DOB: 04-12-1961 DOA: 01/22/2022 PCP: Excell Seltzer, MD   Brief Narrative:  Patient is a 61 year old female with history of chronic pancreatitis with pseudocyst with extension into the retroperitoneum, type 2 diabetes, cirrhosis, hypertension who presented with abdominal distention, erythema of the left flank, chills, draining from left flank.  No history of fever.  CT imaging showed features of cellulitis of the left flank, large amount of ascites , a large peripherally enhancing complex fluid collection  within the left posterior pararenal space, with posterior extension through the posterior left abdominal wall.  General surgery, IR, GI, ID consulted and following.  Currently on broad-spectrum antibiotics.  General surgery planning for incision and drainage of abscess on the back.  GI also planning for ERCP to rule out pancreatic duct leak  Assessment & Plan:  Principal Problem:   Sepsis (HCC) Active Problems:   Ascites   HTN (hypertension)   Pancreatic abscess   Abnormal CT of the abdomen   Chronic hyponatremia   Type 2 diabetes mellitus with hyperglycemia, with long-term current use of insulin (HCC)   Pancreatic pseudocyst   Cellulitis   Allergic reaction   Acute pancreatic fluid collection   Cellulitis of back   Infected pseudocyst of pancreas   Cellulitis and abscess of trunk   Infected pancreatic pseudocyst   Leakage from tail of pancreas   Medication monitoring encounter  Sepsis secondary to cellulitis, draining pseudocyst: Presented with tachycardia, tachypnea, elevated lactate.  Found to have cellulitis of the left flank, large amount of ascites , a large peripherally enhancing complex fluid collection  within the left posterior pararenal space, with posterior extension through the posterior left abdominal wall.  Started on broad-spectrum antibiotics.  IR consulted and she underwent ultrasound-guided aspiration of the left  posterior abdominal wall collection.  Fluid was bloody, purulent.  Aerobic/anaerobic cultures showing moderate Klebsiella pneumoniae.  Currently on vancomycin, Unasyn. General surgery also consulted for cellulitis.She has  a draining wound on her left back,plan for I&D tomorrow   Alcoholic cirrhosis/pancreatic pseudocyst: GI following.  Has history of portal hypertension.  Has history of chronic alcoholic pancreatitis with recent peripancreatic/retroperitoneal infected pseudocyst/abscess.  MRCP showed near-complete resolution of left abdominal pancreatic pseudocyst,hepatic cirrhosis and portal venous hypertension resulting in splenomegaly and gastroesophageal varices,diffuse biliary ductal dilatation with stricture within the pancreatic head. No radiographic signs of pancreatic neoplasm. Abdomen is not tense, will check if she needs paracentesis during this hospitalization. GI considering ERCP to rule out pancreatic duct leak during this hospitalization  Type 2 diabetes: Continue current insulin regimen.  Monitor blood sugars  Hypertension: On Lasix, spironolactone at home.  resumed  Chronic hyponatremia: Likely from cirrhosis.  Sodium remained stable around 130  Hypomagnesemia: Supplemented with magnesium         DVT prophylaxis:SCDs Start: 01/22/22 1701     Code Status: Full Code  Family Communication: None at bedside  Patient status:Inpatient  Patient is from :Home  Anticipated discharge KG:[facility]  Estimated DC date:2-3 days   Consultants: GI,IR  Procedures:None  Antimicrobials:  Anti-infectives (From admission, onward)    Start     Dose/Rate Route Frequency Ordered Stop   01/24/22 1400  vancomycin (VANCOREADY) IVPB 1250 mg/250 mL        1,250 mg 166.7 mL/hr over 90 Minutes Intravenous Every 24 hours 01/24/22 0819     01/24/22 1400  vancomycin (VANCOCIN) capsule 125 mg        125 mg Oral 2 times daily  01/24/22 1311     01/23/22 1400  vancomycin (VANCOREADY) IVPB  1500 mg/300 mL  Status:  Discontinued        1,500 mg 150 mL/hr over 120 Minutes Intravenous Every 24 hours 01/22/22 1604 01/24/22 0819   01/22/22 1800  Ampicillin-Sulbactam (UNASYN) 3 g in sodium chloride 0.9 % 100 mL IVPB        3 g 200 mL/hr over 30 Minutes Intravenous Every 6 hours 01/22/22 1714     01/22/22 1345  cefTRIAXone (ROCEPHIN) 2 g in sodium chloride 0.9 % 100 mL IVPB        2 g 200 mL/hr over 30 Minutes Intravenous  Once 01/22/22 1342 01/22/22 1430   01/22/22 1345  vancomycin (VANCOREADY) IVPB 1250 mg/250 mL        1,250 mg 166.7 mL/hr over 90 Minutes Intravenous  Once 01/22/22 1342 01/23/22 0435       Subjective: Patient seen and examined at the bedside today.  Looks very comfortable, sitting in the chair without any new complaints.  Objective: Vitals:   01/24/22 0350 01/24/22 1120 01/24/22 2035 01/25/22 0519  BP: 122/70 114/81 125/78 (!) 152/96  Pulse: (!) 55 74 86 92  Resp: 13 17 20 20   Temp: 97.6 F (36.4 C) 97.7 F (36.5 C) 99 F (37.2 C) 97.7 F (36.5 C)  TempSrc: Oral  Oral Oral  SpO2: 100% 100% 100% 100%  Weight:      Height:        Intake/Output Summary (Last 24 hours) at 01/25/2022 1051 Last data filed at 01/25/2022 1000 Gross per 24 hour  Intake 1889.27 ml  Output --  Net 1889.27 ml   Filed Weights   01/22/22 1725  Weight: 70.4 kg    Examination:  General exam: Overall comfortable, not in distress HEENT: PERRL Respiratory system:  no wheezes or crackles  Cardiovascular system: S1 & S2 heard, RRR.  Gastrointestinal system: Abdomen is mildly distended, soft and nontender. Central nervous system: Alert and oriented Extremities: No edema, no clubbing ,no cyanosis Skin:  Erythematous draining wound on the left back   Data Reviewed: I have personally reviewed following labs and imaging studies  CBC: Recent Labs  Lab 01/22/22 1217 01/23/22 0508 01/24/22 0452 01/25/22 0509  WBC 15.7* 10.1 9.6 12.0*  NEUTROABS 13.6*  --   --   --    HGB 10.0* 9.6* 9.2* 11.3*  HCT 30.7* 29.7* 28.2* 36.0  MCV 87.7 88.9 87.9 91.4  PLT 261 217 231 962   Basic Metabolic Panel: Recent Labs  Lab 01/22/22 1217 01/22/22 1600 01/23/22 0508 01/24/22 0452 01/25/22 0509  NA 126* 128* 130* 131* 134*  K 3.7 3.7 4.2 4.5 3.7  CL 93* 94* 98 100 104  CO2 25 26 23 24  21*  GLUCOSE 174* 110* 218* 233* 168*  BUN 10 10 12 17 16   CREATININE 0.97 0.91 1.04* 1.08* 1.13*  CALCIUM 8.4* 8.5* 8.2* 8.1* 8.5*  MG  --   --   --  1.5* 1.8  PHOS  --   --   --  3.7  --      Recent Results (from the past 240 hour(s))  Blood Culture (routine x 2)     Status: None (Preliminary result)   Collection Time: 01/22/22 12:17 PM   Specimen: BLOOD  Result Value Ref Range Status   Specimen Description   Final    BLOOD RIGHT ANTECUBITAL Performed at [facility], [address], [place],  [address]  Special Requests   Final    BOTTLES DRAWN AEROBIC AND ANAEROBIC Blood Culture adequate volume Performed at [facility], [address] [place], [place] [address]    Culture   Final    NO GROWTH 3 DAYS Performed at [facility], [address] [place], [place] [address]    Report Status PENDING  Incomplete  Blood Culture (routine x 2)     Status: None (Preliminary result)   Collection Time: 01/22/22  5:06 PM   Specimen: BLOOD  Result Value Ref Range Status   Specimen Description   Final    BLOOD SITE NOT SPECIFIED Performed at [facility], [address] [place], [place] [address]    Special Requests   Final    BOTTLES DRAWN AEROBIC ONLY Blood Culture adequate volume Performed at [facility], [address], [place], [place] [address]    Culture   Final    NO GROWTH 3 DAYS Performed at [facility], [address], [place], [place] [address]    Report Status PENDING  Incomplete  Aerobic/Anaerobic Culture w Gram Stain (surgical/deep wound)     Status:  None (Preliminary result)   Collection Time: 01/23/22 10:06 AM   Specimen: PATH Cytology Peritoneal fluid  Result Value Ref Range Status   Specimen Description   Final    PERITONEAL Performed at [facility], [address] [place], [place] [address]    Special Requests   Final    NONE Performed at [facility], [address], [place], [place] [address]    Gram Stain NO WBC SEEN NO ORGANISMS SEEN   Final   Culture   Final    NO GROWTH 2 DAYS Performed at [facility], [address] [place], [place] [address]    Report Status PENDING  Incomplete  Aerobic/Anaerobic Culture w Gram Stain (surgical/deep wound)     Status: None (Preliminary result)   Collection Time: 01/23/22  4:59 PM   Specimen: Abscess  Result Value Ref Range Status   Specimen Description   Final    ABSCESS Performed at [facility], [address] [place], [place] [address]    Special Requests   Final    NONE Performed at [facility], [address] [place], [place] [address]    Gram Stain   Final    ABUNDANT WBC PRESENT, PREDOMINANTLY PMN NO ORGANISMS SEEN    Culture   Final    MODERATE KLEBSIELLA PNEUMONIAE SUSCEPTIBILITIES TO FOLLOW Performed at [facility], [address], [place], [place] [address]    Report Status PENDING  Incomplete  Surgical pcr screen     Status: None   Collection Time: 01/25/22  7:46 AM   Specimen: Nasal Mucosa; Nasal Swab  Result Value Ref Range Status   MRSA, PCR NEGATIVE NEGATIVE Final   Staphylococcus aureus NEGATIVE NEGATIVE Final    Comment: (NOTE) The Xpert SA Assay (FDA approved for NASAL specimens in patients 59 years of age and older), is one component of a comprehensive surveillance program. It is not intended to diagnose infection nor to guide or monitor treatment. Performed at [facility], [address], [place], [place] [address]       Radiology Studies: MR ABDOMEN MRCP W WO CONTAST  Result Date: 01/25/2022 CLINICAL DATA:  Abdominal distention. Cirrhosis and portal venous hypertension. Chronic pancreatitis with infected pseudocyst. Recent aspiration  of left abdominal wall fluid collection. EXAM: MRI ABDOMEN WITHOUT AND WITH CONTRAST (INCLUDING MRCP) TECHNIQUE: Multiplanar multisequence MR imaging of the abdomen was performed both before and after the administration of intravenous contrast. Heavily T2-weighted images of the biliary and pancreatic ducts were obtained, and three-dimensional MRCP images were rendered by post processing. CONTRAST:  7mL GADAVIST GADOBUTROL 1 MMOL/ML IV SOLN COMPARISON:  CT on 01/22/2022 FINDINGS: Lower chest: No acute findings. Hepatobiliary: Hepatic cirrhosis is demonstrated. No hepatic masses identified. Large amount of ascites is seen as well as diffuse mesenteric and body wall edema. Prior cholecystectomy. Biliary ductal dilatation is seen with common bile duct measuring 17 mm in diameter. Stricture of the distal common bile duct is seen within the pancreatic head. Pancreas: No pancreatic mass identified. Pancreas is atrophic. Previously seen large peripancreatic fluid collection adjacent to the peripancreatic tail which extends into the adjacent left posterolateral abdominal wall soft tissues has significantly decreased in size since previous study. Residual rim enhancing fluid collection seen in the left posterior abdominal wall subcutaneous tissues measures 5.3 x 3.0 cm. Spleen: Splenomegaly is again seen, consistent portal venous hypertension. Adrenals/Urinary Tract: No suspicious masses identified. No evidence of hydronephrosis. Stomach/Bowel: Unremarkable. Vascular/Lymphatic: No pathologically enlarged lymph nodes identified. Recanalization of paraumbilical veins and portosystemic venous collaterals are seen as well as varices at the GE junction, consistent with portal venous hypertension. No acute  vascular findings. Other:  None. Musculoskeletal:  No suspicious bone lesions identified. IMPRESSION: Near-complete resolution of left abdominal pancreatic pseudocyst. Residual complex fluid collection in the left posterior abdominal wall soft tissues measures 5.3 x 3.0 cm. Hepatic cirrhosis and portal venous hypertension resulting in splenomegaly and gastroesophageal varices. No evidence of hepatic neoplasm. Large amount of ascites, with diffuse mesenteric and body wall edema. Diffuse biliary ductal dilatation with stricture within the pancreatic head. No radiographic signs of pancreatic neoplasm. Electronically Signed   By: Danae OrleansJohn A Stahl M.D.   On: 01/25/2022 09:11   MR 3D Recon At Scanner  Result Date: 01/25/2022 CLINICAL DATA:  Abdominal distention. Cirrhosis and portal venous hypertension. Chronic pancreatitis with infected pseudocyst. Recent aspiration of left abdominal wall fluid collection. EXAM: MRI ABDOMEN WITHOUT AND WITH CONTRAST (INCLUDING MRCP) TECHNIQUE: Multiplanar multisequence MR imaging of the abdomen was performed both before and after the administration of intravenous contrast. Heavily T2-weighted images of the biliary and pancreatic ducts were obtained, and three-dimensional MRCP images were rendered by post processing. CONTRAST:  7mL GADAVIST GADOBUTROL 1 MMOL/ML IV SOLN COMPARISON:  CT on 01/22/2022 FINDINGS: Lower chest: No acute findings. Hepatobiliary: Hepatic cirrhosis is demonstrated. No hepatic masses identified. Large amount of ascites is seen as well as diffuse mesenteric and body wall edema. Prior cholecystectomy. Biliary ductal dilatation is seen with common bile duct measuring 17 mm in diameter. Stricture of the distal common bile duct is seen within the pancreatic head. Pancreas: No pancreatic mass identified. Pancreas is atrophic. Previously seen large peripancreatic fluid collection adjacent to the peripancreatic tail which extends into the adjacent left posterolateral  abdominal wall soft tissues has significantly decreased in size since previous study. Residual rim enhancing fluid collection seen in the left posterior abdominal wall subcutaneous tissues measures 5.3 x 3.0 cm. Spleen: Splenomegaly is again seen, consistent portal venous hypertension. Adrenals/Urinary Tract: No suspicious masses identified. No evidence of hydronephrosis. Stomach/Bowel: Unremarkable. Vascular/Lymphatic: No pathologically enlarged lymph nodes identified. Recanalization of paraumbilical veins and portosystemic venous collaterals are seen as well as varices at the GE junction, consistent with portal venous hypertension. No acute vascular  findings. Other:  None. Musculoskeletal:  No suspicious bone lesions identified. IMPRESSION: Near-complete resolution of left abdominal pancreatic pseudocyst. Residual complex fluid collection in the left posterior abdominal wall soft tissues measures 5.3 x 3.0 cm. Hepatic cirrhosis and portal venous hypertension resulting in splenomegaly and gastroesophageal varices. No evidence of hepatic neoplasm. Large amount of ascites, with diffuse mesenteric and body wall edema. Diffuse biliary ductal dilatation with stricture within the pancreatic head. No radiographic signs of pancreatic neoplasm. Electronically Signed   By: Danae Orleans M.D.   On: 01/25/2022 09:11   IR US Guide Bx Asp/Drain  Result Date: 01/24/2022 INDICATION: 61 year old with pancreatic pseudocyst that is extending through the posterior left abdominal wall with fluid weeping from the skin. EXAM: ULTRASOUND-GUIDED ASPIRATION OF LEFT POSTERIOR ABDOMINAL WALL FLUID COLLECTION MEDICATIONS: 1% lidocaine for local anesthetic ANESTHESIA/SEDATION: None COMPLICATIONS: None immediate. PROCEDURE: Informed written consent was obtained from the patient after a thorough discussion of the procedural risks, benefits and alternatives. A timeout was performed prior to the initiation of the procedure. Patient was placed  prone. Patient has a visible and palpable abnormality along the left posterior abdomen which is draining watery yellow fluid. This area was evaluated with ultrasound. This area was prepped with Betadine and sterile field was created. Skin was anesthetized along the medial aspect of the left back. Using ultrasound guidance, a 19 gauge Yueh catheter was directed into the heterogeneous collection just below the skin surface. Initially, no significant fluid could be aspirated. The Yueh catheter was manipulated multiple times until eventually 15 mL bloody thick fluid was aspirated. No additional fluid could be aspirated. In addition, large amount of watery yellow fluid was expressed from the small pustules on the skin surface. Yueh catheter was removed. Bandage placed over the puncture site. FINDINGS: The area of concern is the left flank just posterior to the left kidney and involving the posterior abdominal wall. Focal skin erythema with small yellow pustules that are weeping watery yellow fluid. Ultrasound demonstrated a heterogeneous collection just below the skin surface and superficial to the left kidney. Comparing with the ultrasound-guided aspiration from 12/29/2021, it appears that the fluid is more hyperechoic and probably more complex. Only 15 mL of thick bloody fluid could be aspirated from this area. Large amount of watery yellow fluid was expressed from the small pustules on the skin surface. Overall, this area of inflammation along the left flank appeared to be decompressed or less distended by the end of the procedure. IMPRESSION: Ultrasound-guided aspiration of the complex thick fluid involving the left posterior abdominal wall. Based on the prior imaging findings, this fluid collection is concerning for a complex pseudocyst. Fluid was sent for culture, amylase and lipase. Electronically Signed   By: Richarda Overlie M.D.   On: 01/24/2022 09:55    Scheduled Meds:  aspirin EC  81 mg Oral Daily   insulin  aspart  0-5 Units Subcutaneous QHS   insulin aspart  0-9 Units Subcutaneous TID WC   insulin glargine-yfgn  15 Units Subcutaneous Daily   pantoprazole  40 mg Oral BID   vancomycin  125 mg Oral BID   Continuous Infusions:  ampicillin-sulbactam (UNASYN) IV 3 g (01/25/22 0529)   vancomycin 1,250 mg (01/24/22 1351)     LOS: 3 days   Burnadette Pop, MD [facility] P9/21/2023, 10:51 AM

## 2022-01-25 NOTE — Op Note (Signed)
Preoperative diagnosis: back abscess, pancreatic pseudocyst  Postoperative diagnosis: same   Procedure: incision and drainage of back abscess  Surgeon: Gurney Maxin, M.D.  Asst: none  Anesthesia: GETA  Indications for procedure: Mallory Stewart is a 61 y.o. year old female with symptoms of abdominal pain, increasing swelling and pain in the left back. Work up showed a pancreatic pseudocyst that was working its way to the skin. Multi-discipline decision was made to drain the subcutaneous collection and plan for endoscopic intervention soon after.  Description of procedure: The patient was brought into the operative suite. Anesthesia was administered with General endotracheal anesthesia. WHO checklist was applied. The patient was then placed in left lateral position. The area was prepped and draped in the usual sterile fashion.  The location of greatest induration and fluctuance was identified A stab incision was made with purulent output. and Cultures were collected and sent. The cavity was probed with instrument and pockets were opened. The area was palpated to express additional purulence. A counterincision was made medially. A penrose was looped through and sutured to itself with 2-0 silk. Palpation of the cavity did show that it connected to a deeper space beyond the musculature of the back. Hemostasis was applied with cautery. Dressing was put in place the patient tolerated the procedure well and was transferred to pacu. All counts were correct.  Findings: abscess with connection to abdominal cavity  Specimen: abscess of back  Implant: 1/4" penrose   Blood loss: 10 ml  Local anesthesia:  20 ml marcaine   Complications: none  Plan: endoscopic management of chronic pancreatitis  Gurney Maxin, M.D. General, Bariatric, & Minimally Invasive Surgery [facility]

## 2022-01-26 ENCOUNTER — Encounter (HOSPITAL_COMMUNITY): Payer: Self-pay | Admitting: General Surgery

## 2022-01-26 DIAGNOSIS — L02211 Cutaneous abscess of abdominal wall: Secondary | ICD-10-CM | POA: Diagnosis not present

## 2022-01-26 DIAGNOSIS — K8689 Other specified diseases of pancreas: Secondary | ICD-10-CM

## 2022-01-26 DIAGNOSIS — A419 Sepsis, unspecified organism: Secondary | ICD-10-CM | POA: Diagnosis not present

## 2022-01-26 DIAGNOSIS — R6520 Severe sepsis without septic shock: Secondary | ICD-10-CM | POA: Diagnosis not present

## 2022-01-26 DIAGNOSIS — K863 Pseudocyst of pancreas: Secondary | ICD-10-CM | POA: Diagnosis not present

## 2022-01-26 DIAGNOSIS — Z5181 Encounter for therapeutic drug level monitoring: Secondary | ICD-10-CM | POA: Diagnosis not present

## 2022-01-26 LAB — COMPREHENSIVE METABOLIC PANEL
ALT: 10 U/L (ref 0–44)
AST: 16 U/L (ref 15–41)
Albumin: 2.2 g/dL — ABNORMAL LOW (ref 3.5–5.0)
Alkaline Phosphatase: 93 U/L (ref 38–126)
Anion gap: 3 — ABNORMAL LOW (ref 5–15)
BUN: 17 mg/dL (ref 6–20)
CO2: 27 mmol/L (ref 22–32)
Calcium: 8.6 mg/dL — ABNORMAL LOW (ref 8.9–10.3)
Chloride: 105 mmol/L (ref 98–111)
Creatinine, Ser: 1.15 mg/dL — ABNORMAL HIGH (ref 0.44–1.00)
GFR, Estimated: 55 mL/min — ABNORMAL LOW (ref >60)
Glucose, Bld: 263 mg/dL — ABNORMAL HIGH (ref 70–99)
Potassium: 5.1 mmol/L (ref 3.5–5.1)
Sodium: 135 mmol/L (ref 135–145)
Total Bilirubin: 0.5 mg/dL (ref 0.3–1.2)
Total Protein: 5.5 g/dL — ABNORMAL LOW (ref 6.5–8.1)

## 2022-01-26 LAB — CBC
HCT: 29.5 % — ABNORMAL LOW (ref 36.0–46.0)
Hemoglobin: 9.7 g/dL — ABNORMAL LOW (ref 12.0–15.0)
MCH: 28.7 pg (ref 26.0–34.0)
MCHC: 32.9 g/dL (ref 30.0–36.0)
MCV: 87.3 fL (ref 80.0–100.0)
Platelets: 222 10*3/uL (ref 150–400)
RBC: 3.38 MIL/uL — ABNORMAL LOW (ref 3.87–5.11)
RDW: 13.2 % (ref 11.5–15.5)
WBC: 4.5 10*3/uL (ref 4.0–10.5)
nRBC: 0 % (ref 0.0–0.2)

## 2022-01-26 LAB — GLUCOSE, CAPILLARY
Glucose-Capillary: 207 mg/dL — ABNORMAL HIGH (ref 70–99)
Glucose-Capillary: 214 mg/dL — ABNORMAL HIGH (ref 70–99)
Glucose-Capillary: 218 mg/dL — ABNORMAL HIGH (ref 70–99)
Glucose-Capillary: 189 mg/dL — ABNORMAL HIGH (ref 70–99)

## 2022-01-26 NOTE — Plan of Care (Signed)
  Problem: Education: Goal: Knowledge of General Education information will improve Description: Including pain rating scale, medication(s)/side effects and non-pharmacologic comfort measures Outcome: Progressing   Problem: Coping: Goal: Level of anxiety will decrease Outcome: Progressing   Problem: Pain Managment: Goal: General experience of comfort will improve Outcome: Progressing   

## 2022-01-26 NOTE — Progress Notes (Signed)
Progress Note  Primary GI: Dr. Carlean Purl   Subjective  Chief Complaint: Pancreatic pseudocyst  Patient lying in bed, no family at bedside.  States she is feeling well today, some pain at the I&D site but states it is nice to be able to not have the pressure there to be able to lay in bed flat.  She feels her fluid status is improving slightly to with less swelling in legs and abdomen.   Denies fever chills, nausea, vomiting.    Objective   Vital signs in last 24 hours: Temp:  [96.1 F (35.6 C)-98.4 F (36.9 C)] 97.7 F (36.5 C) (09/22 0457) Pulse Rate:  [63-102] 63 (09/22 0457) Resp:  [12-18] 18 (09/22 0457) BP: (111-163)/(67-118) 111/67 (09/22 0457) SpO2:  [92 %-100 %] 100 % (09/22 0457) Last BM Date : 01/21/22 Last BM recorded by nurses in past 5 days Stool Type: Type 3 (Sausage shape with surface cracks) (01/25/2022 10:00 AM)  General: 61 year old female sitting up in the chair in no acute distress. Heart: Regular rate and rhythm, no murmurs. Pulm: Breath sounds clear. Abdomen: Soft, nondistended.  Nontender.  Positive bowel sounds all 4 quadrants.  Left posterior flank dressing intact, surrounding area is erythematous with a scant amount of purulent matter from the small pustules. Extremities:  With pitted edema mild to level of the thigh. Neurologic:  Alert and  oriented x 4. Grossly normal neurologically. Psych:  Alert and cooperative. Normal mood and affect.  Intake/Output from previous day: 09/21 0701 - 09/22 0700 In: 1505.2 [P.O.:480; I.V.:500.2; IV Piggyback:525] Out: 10 [Blood:10] Intake/Output this shift: No intake/output data recorded.  Studies/Results: MR ABDOMEN MRCP W WO CONTAST  Result Date: 01/25/2022 CLINICAL DATA:  Abdominal distention. Cirrhosis and portal venous hypertension. Chronic pancreatitis with infected pseudocyst. Recent aspiration of left abdominal wall fluid collection. EXAM: MRI ABDOMEN WITHOUT AND WITH CONTRAST (INCLUDING MRCP)  TECHNIQUE: Multiplanar multisequence MR imaging of the abdomen was performed both before and after the administration of intravenous contrast. Heavily T2-weighted images of the biliary and pancreatic ducts were obtained, and three-dimensional MRCP images were rendered by post processing. CONTRAST:  82mL GADAVIST GADOBUTROL 1 MMOL/ML IV SOLN COMPARISON:  CT on 01/22/2022 FINDINGS: Lower chest: No acute findings. Hepatobiliary: Hepatic cirrhosis is demonstrated. No hepatic masses identified. Large amount of ascites is seen as well as diffuse mesenteric and body wall edema. Prior cholecystectomy. Biliary ductal dilatation is seen with common bile duct measuring 17 mm in diameter. Stricture of the distal common bile duct is seen within the pancreatic head. Pancreas: No pancreatic mass identified. Pancreas is atrophic. Previously seen large peripancreatic fluid collection adjacent to the peripancreatic tail which extends into the adjacent left posterolateral abdominal wall soft tissues has significantly decreased in size since previous study. Residual rim enhancing fluid collection seen in the left posterior abdominal wall subcutaneous tissues measures 5.3 x 3.0 cm. Spleen: Splenomegaly is again seen, consistent portal venous hypertension. Adrenals/Urinary Tract: No suspicious masses identified. No evidence of hydronephrosis. Stomach/Bowel: Unremarkable. Vascular/Lymphatic: No pathologically enlarged lymph nodes identified. Recanalization of paraumbilical veins and portosystemic venous collaterals are seen as well as varices at the GE junction, consistent with portal venous hypertension. No acute vascular findings. Other:  None. Musculoskeletal:  No suspicious bone lesions identified. IMPRESSION: Near-complete resolution of left abdominal pancreatic pseudocyst. Residual complex fluid collection in the left posterior abdominal wall soft tissues measures 5.3 x 3.0 cm. Hepatic cirrhosis and portal venous hypertension  resulting in splenomegaly and gastroesophageal varices. No evidence of hepatic  neoplasm. Large amount of ascites, with diffuse mesenteric and body wall edema. Diffuse biliary ductal dilatation with stricture within the pancreatic head. No radiographic signs of pancreatic neoplasm. Electronically Signed   By: Danae Orleans M.D.   On: 01/25/2022 09:11   MR 3D Recon At Scanner  Result Date: 01/25/2022 CLINICAL DATA:  Abdominal distention. Cirrhosis and portal venous hypertension. Chronic pancreatitis with infected pseudocyst. Recent aspiration of left abdominal wall fluid collection. EXAM: MRI ABDOMEN WITHOUT AND WITH CONTRAST (INCLUDING MRCP) TECHNIQUE: Multiplanar multisequence MR imaging of the abdomen was performed both before and after the administration of intravenous contrast. Heavily T2-weighted images of the biliary and pancreatic ducts were obtained, and three-dimensional MRCP images were rendered by post processing. CONTRAST:  73mL GADAVIST GADOBUTROL 1 MMOL/ML IV SOLN COMPARISON:  CT on 01/22/2022 FINDINGS: Lower chest: No acute findings. Hepatobiliary: Hepatic cirrhosis is demonstrated. No hepatic masses identified. Large amount of ascites is seen as well as diffuse mesenteric and body wall edema. Prior cholecystectomy. Biliary ductal dilatation is seen with common bile duct measuring 17 mm in diameter. Stricture of the distal common bile duct is seen within the pancreatic head. Pancreas: No pancreatic mass identified. Pancreas is atrophic. Previously seen large peripancreatic fluid collection adjacent to the peripancreatic tail which extends into the adjacent left posterolateral abdominal wall soft tissues has significantly decreased in size since previous study. Residual rim enhancing fluid collection seen in the left posterior abdominal wall subcutaneous tissues measures 5.3 x 3.0 cm. Spleen: Splenomegaly is again seen, consistent portal venous hypertension. Adrenals/Urinary Tract: No suspicious  masses identified. No evidence of hydronephrosis. Stomach/Bowel: Unremarkable. Vascular/Lymphatic: No pathologically enlarged lymph nodes identified. Recanalization of paraumbilical veins and portosystemic venous collaterals are seen as well as varices at the GE junction, consistent with portal venous hypertension. No acute vascular findings. Other:  None. Musculoskeletal:  No suspicious bone lesions identified. IMPRESSION: Near-complete resolution of left abdominal pancreatic pseudocyst. Residual complex fluid collection in the left posterior abdominal wall soft tissues measures 5.3 x 3.0 cm. Hepatic cirrhosis and portal venous hypertension resulting in splenomegaly and gastroesophageal varices. No evidence of hepatic neoplasm. Large amount of ascites, with diffuse mesenteric and body wall edema. Diffuse biliary ductal dilatation with stricture within the pancreatic head. No radiographic signs of pancreatic neoplasm. Electronically Signed   By: Danae Orleans M.D.   On: 01/25/2022 09:11    Lab Results: Recent Labs    01/24/22 0452 01/25/22 0509 01/26/22 0451  WBC 9.6 12.0* 4.5  HGB 9.2* 11.3* 9.7*  HCT 28.2* 36.0 29.5*  PLT 231 353 222   BMET Recent Labs    01/24/22 0452 01/25/22 0509 01/26/22 0451  NA 131* 134* 135  K 4.5 3.7 5.1  CL 100 104 105  CO2 24 21* 27  GLUCOSE 233* 168* 263*  BUN 17 16 17   CREATININE 1.08* 1.13* 1.15*  CALCIUM 8.1* 8.5* 8.6*   LFT Recent Labs    01/25/22 0509 01/26/22 0451  PROT 6.3* 5.5*  ALBUMIN 2.6* 2.2*  AST 24 16  ALT 10 10  ALKPHOS 121 93  BILITOT 0.5 0.5  BILIDIR 0.2  --   IBILI 0.3  --    PT/INR Recent Labs    01/25/22 0509  LABPROT 15.5*  INR 1.2      Patient profile:   Very complicated patient with alcohol associated chronic pancreatitis, peripancreatic/retroperitoneal infected pseudocysts status post aspiration readmitted to the hospital with sepsis secondary cellulitis from the aspiration site. S/P ultrasound-guided  aspiration of the left posterior  abdominal wall collection by interventional radiology 01/23/2022. prior to aspiration and a moderate amount of fluid drained externally by compressing this area manually.  15 mL of thick bloody purulent aspirated fluid was sent for analysis, culture/lipase results pending.  01/25/2022 I&D with general surgery   Impression/Plan:   Chronic pancreatitis with peripancreatic and retroperitoneal infected pseudocyst ID following Unasyn vancomycin CBC with slight elevation white blood cell count, hemoglobin improved, kidney liver stable. Status post I&D of abscess with general surgery Due to anesthesia restrictions, ERCP tomorrow with Dr. Meridee Score to rule out pancreatic duct leak. Will advance diet today, n.p.o. at midnight  Cirrhosis secondary to alcohol with history of esophageal and gastric varices, ascites Status post paracentesis 2.9 L no SBP Difficult fluid status with history of hyponatremia secondary to diuretics, tolerating Lasix 20 mg and Aldactone 25 mg started on the 21st, will continue at this dose and monitor renal function, in hopes of increasing over next couple days. MELD 3.0: 14 at 01/26/2022  4:51 AM MELD-Na: 10 at 01/26/2022  4:51 AM Calculated from: Serum Creatinine: 1.15 mg/dL at 4/40/3474  2:59 AM Serum Sodium: 135 mmol/L at 01/26/2022  4:51 AM Total Bilirubin: 0.5 mg/dL (Using min of 1 mg/dL) at 5/63/8756  4:33 AM Serum Albumin: 2.2 g/dL at 2/95/1884  1:66 AM INR(ratio): 1.2 at 01/25/2022  5:09 AM Age at listing (hypothetical): 61 years Sex: Female at 01/26/2022  4:51 AM  Chronic normocytic anemia CBC on 01/26/2022   WBC 4.5 HGB 9.7 (11.3)  MCV 87.3 Platelets 222 No overt GI bleeding Decreased likely secondary to I&D/procedure.  Monitor closely.  Hyponatremia NA 135 Continue diuretics Lasix 20 and Aldactone 25, monitor closely     LOS: 4 days   Doree Albee  01/26/2022, 9:10 AM

## 2022-01-26 NOTE — Progress Notes (Signed)
PROGRESS NOTE  MYSTIC LABO  WUJ:[id number] DOB: 1960-06-26 DOA: 01/22/2022 PCP: Excell Seltzer, MD   Brief Narrative:  Patient is a 61 year old female with history of chronic pancreatitis with pseudocyst with extension into the retroperitoneum, type 2 diabetes, cirrhosis, hypertension who presented with abdominal distention, erythema of the left flank, chills, draining from left flank.  No history of fever.  CT imaging showed features of cellulitis of the left flank, large amount of ascites , a large peripherally enhancing complex fluid collection  within the left posterior pararenal space, with posterior extension through the posterior left abdominal wall.  General surgery, IR, GI, ID consulted and following.  Currently on broad-spectrum antibiotics.  General surgery did  incision and drainage of abscess on the back on 9/21.  GI also planning for ERCP to rule out pancreatic duct leak tomorrow  Assessment & Plan:  Principal Problem:   Sepsis (HCC) Active Problems:   Ascites   HTN (hypertension)   Pancreatic abscess   Abnormal CT of the abdomen   Chronic hyponatremia   Type 2 diabetes mellitus with hyperglycemia, with long-term current use of insulin (HCC)   Pancreatic pseudocyst   Cellulitis   Allergic reaction   Acute pancreatic fluid collection   Cellulitis of back   Infected pseudocyst of pancreas   Cellulitis and abscess of trunk   Infected pancreatic pseudocyst   Leakage from tail of pancreas   Medication monitoring encounter   Abdominal wall abscess   Atrophic pancreas   Alcohol-induced chronic pancreatitis (HCC)  Sepsis secondary to cellulitis, draining pseudocyst: Presented with tachycardia, tachypnea, elevated lactate.  Found to have cellulitis of the left flank, large amount of ascites , a large peripherally enhancing complex fluid collection  within the left posterior pararenal space, with posterior extension through the posterior left abdominal wall.  Started on  broad-spectrum antibiotics.  IR consulted and she underwent ultrasound-guided aspiration of the left posterior abdominal wall collection.  Fluid was bloody, purulent.  Aerobic/anaerobic cultures showing moderate Klebsiella pneumoniae. Currently on vancomycin, Unasyn. General surgery also consulted for cellulitis.She had a draining wound on her left back,S/p  I&D on 9/21   Alcoholic cirrhosis/pancreatic pseudocyst: GI following.  Has history of portal hypertension.  Has history of chronic alcoholic pancreatitis with recent peripancreatic/retroperitoneal infected pseudocyst/abscess.  MRCP showed near-complete resolution of left abdominal pancreatic pseudocyst,hepatic cirrhosis and portal venous hypertension resulting in splenomegaly and gastroesophageal varices,diffuse biliary ductal dilatation with stricture within the pancreatic head. No radiographic signs of pancreatic neoplasm. Abdomen is not tense, will check if she needs paracentesis during this hospitalization. GI considering ERCP to rule out pancreatic duct leak on 9.23  Type 2 diabetes: Continue current insulin regimen.  Monitor blood sugars  Hypertension: On Lasix, spironolactone at home.  resumed  Chronic hyponatremia: Likely from cirrhosis.  Sodium remained stable around 130  Hypomagnesemia: Supplemented with magnesium         DVT prophylaxis:SCDs Start: 01/22/22 1701     Code Status: Full Code  Family Communication: None at bedside  Patient status:Inpatient  Patient is from :Home  Anticipated discharge NF:AOZH  Estimated DC date:2-3 days   Consultants: GI,IR  Procedures:None  Antimicrobials:  Anti-infectives (From admission, onward)    Start     Dose/Rate Route Frequency Ordered Stop   01/24/22 1400  vancomycin (VANCOREADY) IVPB 1250 mg/250 mL  Status:  Discontinued        1,250 mg 166.7 mL/hr over 90 Minutes Intravenous Every 24 hours 01/24/22 0819 01/26/22 0858   01/24/22  1400  vancomycin (VANCOCIN)  capsule 125 mg        125 mg Oral 2 times daily 01/24/22 1311     01/23/22 1400  vancomycin (VANCOREADY) IVPB 1500 mg/300 mL  Status:  Discontinued        1,500 mg 150 mL/hr over 120 Minutes Intravenous Every 24 hours 01/22/22 1604 01/24/22 0819   01/22/22 1800  Ampicillin-Sulbactam (UNASYN) 3 g in sodium chloride 0.9 % 100 mL IVPB        3 g 200 mL/hr over 30 Minutes Intravenous Every 6 hours 01/22/22 1714     01/22/22 1345  cefTRIAXone (ROCEPHIN) 2 g in sodium chloride 0.9 % 100 mL IVPB        2 g 200 mL/hr over 30 Minutes Intravenous  Once 01/22/22 1342 01/22/22 1430   01/22/22 1345  vancomycin (VANCOREADY) IVPB 1250 mg/250 mL        1,250 mg 166.7 mL/hr over 90 Minutes Intravenous  Once 01/22/22 1342 01/23/22 0435       Subjective: Patient seen and examined at the bedside today.  Hemodynamically stable.  Comfortable without any new complaints today  Objective: Vitals:   01/25/22 1622 01/25/22 1803 01/25/22 2117 01/26/22 0457  BP: (!) 149/99  (!) 114/90 111/67  Pulse: 82  74 63  Resp: 16  18 18   Temp:  (!) 97.5 F (36.4 C) 97.8 F (36.6 C) 97.7 F (36.5 C)  TempSrc:  Oral Oral Oral  SpO2: 100%  100% 100%  Weight:      Height:        Intake/Output Summary (Last 24 hours) at 01/26/2022 1118 Last data filed at 01/25/2022 1801 Gross per 24 hour  Intake 1505.2 ml  Output 10 ml  Net 1495.2 ml   Filed Weights   01/22/22 1725  Weight: 70.4 kg    Examination:   General exam: Overall comfortable, not in distress HEENT: PERRL Respiratory system:  no wheezes or crackles  Cardiovascular system: S1 & S2 heard, RRR.  Gastrointestinal system: Abdomen is nondistended, soft and nontender. Central nervous system: Alert and oriented Extremities: No edema, no clubbing ,no cyanosis Skin: Cellulitis wound covered with dressing on the left back   Data Reviewed: I have personally reviewed following labs and imaging studies  CBC: Recent Labs  Lab 01/22/22 1217 01/23/22 0508  01/24/22 0452 01/25/22 0509 01/26/22 0451  WBC 15.7* 10.1 9.6 12.0* 4.5  NEUTROABS 13.6*  --   --   --   --   HGB 10.0* 9.6* 9.2* 11.3* 9.7*  HCT 30.7* 29.7* 28.2* 36.0 29.5*  MCV 87.7 88.9 87.9 91.4 87.3  PLT 261 217 231 353 222   Basic Metabolic Panel: Recent Labs  Lab 01/22/22 1600 01/23/22 0508 01/24/22 0452 01/25/22 0509 01/26/22 0451  NA 128* 130* 131* 134* 135  K 3.7 4.2 4.5 3.7 5.1  CL 94* 98 100 104 105  CO2 26 23 24  21* 27  GLUCOSE 110* 218* 233* 168* 263*  BUN 10 12 17 16 17   CREATININE 0.91 1.04* 1.08* 1.13* 1.15*  CALCIUM 8.5* 8.2* 8.1* 8.5* 8.6*  MG  --   --  1.5* 1.8  --   PHOS  --   --  3.7  --   --      Recent Results (from the past 240 hour(s))  Blood Culture (routine x 2)     Status: None (Preliminary result)   Collection Time: 01/22/22 12:17 PM   Specimen: BLOOD  Result Value Ref Range  Status   Specimen Description   Final    BLOOD RIGHT ANTECUBITAL Performed at [facility], [address], [place], [place] [address]    Special Requests   Final    BOTTLES DRAWN AEROBIC AND ANAEROBIC Blood Culture adequate volume Performed at [facility], [address], [place], [place] [address]    Culture   Final    NO GROWTH 3 DAYS Performed at [facility], [address], [place], [place] [address]    Report Status PENDING  Incomplete  Blood Culture (routine x 2)     Status: None (Preliminary result)   Collection Time: 01/22/22  5:06 PM   Specimen: BLOOD  Result Value Ref Range Status   Specimen Description   Final    BLOOD SITE NOT SPECIFIED Performed at [facility], [address] [place], [place] [address]    Special Requests   Final    BOTTLES DRAWN AEROBIC ONLY Blood Culture adequate volume Performed at [facility], [address] [place], [place] [address]    Culture   Final    NO GROWTH 3 DAYS Performed at [facility], [address]  [place], [place] [address]    Report Status PENDING  Incomplete  Aerobic/Anaerobic Culture w Gram Stain (surgical/deep wound)     Status: None (Preliminary result)   Collection Time: 01/23/22 10:06 AM   Specimen: PATH Cytology Peritoneal fluid  Result Value Ref Range Status   Specimen Description   Final    PERITONEAL Performed at [facility], [address] [place], [place] [address]    Special Requests   Final    NONE Performed at [facility], [address], [place], [place] [address]    Gram Stain NO WBC SEEN NO ORGANISMS SEEN   Final   Culture   Final    NO GROWTH 3 DAYS NO ANAEROBES ISOLATED; CULTURE IN PROGRESS FOR 5 DAYS Performed at [facility], [address] [place], [place] [address]    Report Status PENDING  Incomplete  Aerobic/Anaerobic Culture w Gram Stain (surgical/deep wound)     Status: None (Preliminary result)   Collection Time: 01/23/22  4:59 PM   Specimen: Abscess  Result Value Ref Range Status   Specimen Description   Final    ABSCESS Performed at [facility], [address], [place], [place] [address]    Special Requests   Final    NONE Performed at [facility], [address], [place], [place] [address]    Gram Stain   Final    ABUNDANT WBC PRESENT, PREDOMINANTLY PMN NO ORGANISMS SEEN Performed at [facility], [address], [place], [place] [address]    Culture   Final    MODERATE KLEBSIELLA PNEUMONIAE NO ANAEROBES ISOLATED; CULTURE IN PROGRESS FOR 5 DAYS    Report Status PENDING  Incomplete   Organism ID, Bacteria KLEBSIELLA PNEUMONIAE  Final      Susceptibility   Klebsiella pneumoniae - MIC*    AMPICILLIN >=32 RESISTANT Resistant     CEFAZOLIN <=4 SENSITIVE Sensitive     CEFEPIME <=0.12 SENSITIVE Sensitive     CEFTAZIDIME <=1 SENSITIVE Sensitive     CEFTRIAXONE <=0.25 SENSITIVE Sensitive     CIPROFLOXACIN <=0.25 SENSITIVE Sensitive     GENTAMICIN  <=1 SENSITIVE Sensitive     IMIPENEM <=0.25 SENSITIVE Sensitive     TRIMETH/SULFA <=20  SENSITIVE Sensitive     AMPICILLIN/SULBACTAM 4 SENSITIVE Sensitive     PIP/TAZO <=4 SENSITIVE Sensitive     * MODERATE KLEBSIELLA PNEUMONIAE  Surgical pcr screen     Status: None   Collection Time: 01/25/22  7:46 AM   Specimen: Nasal Mucosa; Nasal Swab  Result Value Ref Range Status   MRSA, PCR NEGATIVE NEGATIVE Final   Staphylococcus aureus NEGATIVE NEGATIVE Final    Comment: (NOTE) The Xpert SA Assay (FDA approved for NASAL specimens in patients 62 years of age and older), is one component of a comprehensive surveillance program. It is not intended to diagnose infection nor to guide or monitor treatment. Performed at [facility], [place] [address] [place], [place] [address]   Aerobic/Anaerobic Culture w Gram Stain (surgical/deep wound)     Status: None (Preliminary result)   Collection Time: 01/25/22  3:16 PM   Specimen: PATH Other; Tissue  Result Value Ref Range Status   Specimen Description   Final    ABSCESS Performed at [facility] [address], [place], [place] [address]    Special Requests   Final    NONE BACK Performed at [place] [address] [place], [place] [address]    Gram Stain NO WBC SEEN NO ORGANISMS SEEN   Final   Culture   Final    TOO YOUNG TO READ Performed at [facility], [place] [address] [place], [place] [address]    Report Status PENDING  Incomplete     Radiology Studies: MR ABDOMEN MRCP W WO CONTAST  Result Date: 01/25/2022 CLINICAL DATA:  Abdominal distention. Cirrhosis and portal venous hypertension. Chronic pancreatitis with infected pseudocyst. Recent aspiration of left abdominal wall fluid collection. EXAM: MRI ABDOMEN WITHOUT AND WITH CONTRAST (INCLUDING MRCP) TECHNIQUE: Multiplanar multisequence MR imaging of the abdomen was performed both before and after the administration of  intravenous contrast. Heavily T2-weighted images of the biliary and pancreatic ducts were obtained, and three-dimensional MRCP images were rendered by post processing. CONTRAST:  92mL GADAVIST GADOBUTROL 1 MMOL/ML IV SOLN COMPARISON:  CT on 01/22/2022 FINDINGS: Lower chest: No acute findings. Hepatobiliary: Hepatic cirrhosis is demonstrated. No hepatic masses identified. Large amount of ascites is seen as well as diffuse mesenteric and body wall edema. Prior cholecystectomy. Biliary ductal dilatation is seen with common bile duct measuring 17 mm in diameter. Stricture of the distal common bile duct is seen within the pancreatic head. Pancreas: No pancreatic mass identified. Pancreas is atrophic. Previously seen large peripancreatic fluid collection adjacent to the peripancreatic tail which extends into the adjacent left posterolateral abdominal wall soft tissues has significantly decreased in size since previous study. Residual rim enhancing fluid collection seen in the left posterior abdominal wall subcutaneous tissues measures 5.3 x 3.0 cm. Spleen: Splenomegaly is again seen, consistent portal venous hypertension. Adrenals/Urinary Tract: No suspicious masses identified. No evidence of hydronephrosis. Stomach/Bowel: Unremarkable. Vascular/Lymphatic: No pathologically enlarged lymph nodes identified. Recanalization of paraumbilical veins and portosystemic venous collaterals are seen as well as varices at the GE junction, consistent with portal venous hypertension. No acute vascular findings. Other:  None. Musculoskeletal:  No suspicious bone lesions identified. IMPRESSION: Near-complete resolution of left abdominal pancreatic pseudocyst. Residual complex fluid collection in the left posterior abdominal wall soft tissues measures 5.3 x 3.0 cm. Hepatic cirrhosis and portal venous hypertension resulting in splenomegaly and gastroesophageal varices. No evidence of hepatic neoplasm. Large amount of ascites, with diffuse  mesenteric and body wall edema. Diffuse  biliary ductal dilatation with stricture within the pancreatic head. No radiographic signs of pancreatic neoplasm. Electronically Signed   By: Danae OrleansJohn A Stahl M.D.   On: 01/25/2022 09:11   MR 3D Recon At Scanner  Result Date: 01/25/2022 CLINICAL DATA:  Abdominal distention. Cirrhosis and portal venous hypertension. Chronic pancreatitis with infected pseudocyst. Recent aspiration of left abdominal wall fluid collection. EXAM: MRI ABDOMEN WITHOUT AND WITH CONTRAST (INCLUDING MRCP) TECHNIQUE: Multiplanar multisequence MR imaging of the abdomen was performed both before and after the administration of intravenous contrast. Heavily T2-weighted images of the biliary and pancreatic ducts were obtained, and three-dimensional MRCP images were rendered by post processing. CONTRAST:  7mL GADAVIST GADOBUTROL 1 MMOL/ML IV SOLN COMPARISON:  CT on 01/22/2022 FINDINGS: Lower chest: No acute findings. Hepatobiliary: Hepatic cirrhosis is demonstrated. No hepatic masses identified. Large amount of ascites is seen as well as diffuse mesenteric and body wall edema. Prior cholecystectomy. Biliary ductal dilatation is seen with common bile duct measuring 17 mm in diameter. Stricture of the distal common bile duct is seen within the pancreatic head. Pancreas: No pancreatic mass identified. Pancreas is atrophic. Previously seen large peripancreatic fluid collection adjacent to the peripancreatic tail which extends into the adjacent left posterolateral abdominal wall soft tissues has significantly decreased in size since previous study. Residual rim enhancing fluid collection seen in the left posterior abdominal wall subcutaneous tissues measures 5.3 x 3.0 cm. Spleen: Splenomegaly is again seen, consistent portal venous hypertension. Adrenals/Urinary Tract: No suspicious masses identified. No evidence of hydronephrosis. Stomach/Bowel: Unremarkable. Vascular/Lymphatic: No pathologically enlarged lymph  nodes identified. Recanalization of paraumbilical veins and portosystemic venous collaterals are seen as well as varices at the GE junction, consistent with portal venous hypertension. No acute vascular findings. Other:  None. Musculoskeletal:  No suspicious bone lesions identified. IMPRESSION: Near-complete resolution of left abdominal pancreatic pseudocyst. Residual complex fluid collection in the left posterior abdominal wall soft tissues measures 5.3 x 3.0 cm. Hepatic cirrhosis and portal venous hypertension resulting in splenomegaly and gastroesophageal varices. No evidence of hepatic neoplasm. Large amount of ascites, with diffuse mesenteric and body wall edema. Diffuse biliary ductal dilatation with stricture within the pancreatic head. No radiographic signs of pancreatic neoplasm. Electronically Signed   By: Danae OrleansJohn A Stahl M.D.   On: 01/25/2022 09:11    Scheduled Meds:  aspirin EC  81 mg Oral Daily   furosemide  20 mg Oral QODAY   insulin aspart  0-5 Units Subcutaneous QHS   insulin aspart  0-9 Units Subcutaneous TID WC   insulin glargine-yfgn  15 Units Subcutaneous Daily   pantoprazole  40 mg Oral BID   spironolactone  25 mg Oral Daily   vancomycin  125 mg Oral BID   Continuous Infusions:  sodium chloride 20 mL/hr at 01/25/22 1805   ampicillin-sulbactam (UNASYN) IV 3 g (01/26/22 0513)     LOS: 4 days   Burnadette PopAmrit Leotta Weingarten, MD [facility] P9/22/2023, 11:18 AM

## 2022-01-26 NOTE — H&P (View-Only) (Signed)
Progress Note  Primary GI: Dr. Carlean Purl   Subjective  Chief Complaint: Pancreatic pseudocyst  Patient lying in bed, no family at bedside.  States she is feeling well today, some pain at the I&D site but states it is nice to be able to not have the pressure there to be able to lay in bed flat.  She feels her fluid status is improving slightly to with less swelling in legs and abdomen.   Denies fever chills, nausea, vomiting.    Objective   Vital signs in last 24 hours: Temp:  [96.1 F (35.6 C)-98.4 F (36.9 C)] 97.7 F (36.5 C) (09/22 0457) Pulse Rate:  [63-102] 63 (09/22 0457) Resp:  [12-18] 18 (09/22 0457) BP: (111-163)/(67-118) 111/67 (09/22 0457) SpO2:  [92 %-100 %] 100 % (09/22 0457) Last BM Date : 01/21/22 Last BM recorded by nurses in past 5 days Stool Type: Type 3 (Sausage shape with surface cracks) (01/25/2022 10:00 AM)  General: 61 year old female sitting up in the chair in no acute distress. Heart: Regular rate and rhythm, no murmurs. Pulm: Breath sounds clear. Abdomen: Soft, nondistended.  Nontender.  Positive bowel sounds all 4 quadrants.  Left posterior flank dressing intact, surrounding area is erythematous with a scant amount of purulent matter from the small pustules. Extremities:  With pitted edema mild to level of the thigh. Neurologic:  Alert and  oriented x 4. Grossly normal neurologically. Psych:  Alert and cooperative. Normal mood and affect.  Intake/Output from previous day: 09/21 0701 - 09/22 0700 In: 1505.2 [P.O.:480; I.V.:500.2; IV Piggyback:525] Out: 10 [Blood:10] Intake/Output this shift: No intake/output data recorded.  Studies/Results: MR ABDOMEN MRCP W WO CONTAST  Result Date: 01/25/2022 CLINICAL DATA:  Abdominal distention. Cirrhosis and portal venous hypertension. Chronic pancreatitis with infected pseudocyst. Recent aspiration of left abdominal wall fluid collection. EXAM: MRI ABDOMEN WITHOUT AND WITH CONTRAST (INCLUDING MRCP)  TECHNIQUE: Multiplanar multisequence MR imaging of the abdomen was performed both before and after the administration of intravenous contrast. Heavily T2-weighted images of the biliary and pancreatic ducts were obtained, and three-dimensional MRCP images were rendered by post processing. CONTRAST:  82mL GADAVIST GADOBUTROL 1 MMOL/ML IV SOLN COMPARISON:  CT on 01/22/2022 FINDINGS: Lower chest: No acute findings. Hepatobiliary: Hepatic cirrhosis is demonstrated. No hepatic masses identified. Large amount of ascites is seen as well as diffuse mesenteric and body wall edema. Prior cholecystectomy. Biliary ductal dilatation is seen with common bile duct measuring 17 mm in diameter. Stricture of the distal common bile duct is seen within the pancreatic head. Pancreas: No pancreatic mass identified. Pancreas is atrophic. Previously seen large peripancreatic fluid collection adjacent to the peripancreatic tail which extends into the adjacent left posterolateral abdominal wall soft tissues has significantly decreased in size since previous study. Residual rim enhancing fluid collection seen in the left posterior abdominal wall subcutaneous tissues measures 5.3 x 3.0 cm. Spleen: Splenomegaly is again seen, consistent portal venous hypertension. Adrenals/Urinary Tract: No suspicious masses identified. No evidence of hydronephrosis. Stomach/Bowel: Unremarkable. Vascular/Lymphatic: No pathologically enlarged lymph nodes identified. Recanalization of paraumbilical veins and portosystemic venous collaterals are seen as well as varices at the GE junction, consistent with portal venous hypertension. No acute vascular findings. Other:  None. Musculoskeletal:  No suspicious bone lesions identified. IMPRESSION: Near-complete resolution of left abdominal pancreatic pseudocyst. Residual complex fluid collection in the left posterior abdominal wall soft tissues measures 5.3 x 3.0 cm. Hepatic cirrhosis and portal venous hypertension  resulting in splenomegaly and gastroesophageal varices. No evidence of hepatic  neoplasm. Large amount of ascites, with diffuse mesenteric and body wall edema. Diffuse biliary ductal dilatation with stricture within the pancreatic head. No radiographic signs of pancreatic neoplasm. Electronically Signed   By: Danae Orleans M.D.   On: 01/25/2022 09:11   MR 3D Recon At Scanner  Result Date: 01/25/2022 CLINICAL DATA:  Abdominal distention. Cirrhosis and portal venous hypertension. Chronic pancreatitis with infected pseudocyst. Recent aspiration of left abdominal wall fluid collection. EXAM: MRI ABDOMEN WITHOUT AND WITH CONTRAST (INCLUDING MRCP) TECHNIQUE: Multiplanar multisequence MR imaging of the abdomen was performed both before and after the administration of intravenous contrast. Heavily T2-weighted images of the biliary and pancreatic ducts were obtained, and three-dimensional MRCP images were rendered by post processing. CONTRAST:  73mL GADAVIST GADOBUTROL 1 MMOL/ML IV SOLN COMPARISON:  CT on 01/22/2022 FINDINGS: Lower chest: No acute findings. Hepatobiliary: Hepatic cirrhosis is demonstrated. No hepatic masses identified. Large amount of ascites is seen as well as diffuse mesenteric and body wall edema. Prior cholecystectomy. Biliary ductal dilatation is seen with common bile duct measuring 17 mm in diameter. Stricture of the distal common bile duct is seen within the pancreatic head. Pancreas: No pancreatic mass identified. Pancreas is atrophic. Previously seen large peripancreatic fluid collection adjacent to the peripancreatic tail which extends into the adjacent left posterolateral abdominal wall soft tissues has significantly decreased in size since previous study. Residual rim enhancing fluid collection seen in the left posterior abdominal wall subcutaneous tissues measures 5.3 x 3.0 cm. Spleen: Splenomegaly is again seen, consistent portal venous hypertension. Adrenals/Urinary Tract: No suspicious  masses identified. No evidence of hydronephrosis. Stomach/Bowel: Unremarkable. Vascular/Lymphatic: No pathologically enlarged lymph nodes identified. Recanalization of paraumbilical veins and portosystemic venous collaterals are seen as well as varices at the GE junction, consistent with portal venous hypertension. No acute vascular findings. Other:  None. Musculoskeletal:  No suspicious bone lesions identified. IMPRESSION: Near-complete resolution of left abdominal pancreatic pseudocyst. Residual complex fluid collection in the left posterior abdominal wall soft tissues measures 5.3 x 3.0 cm. Hepatic cirrhosis and portal venous hypertension resulting in splenomegaly and gastroesophageal varices. No evidence of hepatic neoplasm. Large amount of ascites, with diffuse mesenteric and body wall edema. Diffuse biliary ductal dilatation with stricture within the pancreatic head. No radiographic signs of pancreatic neoplasm. Electronically Signed   By: Danae Orleans M.D.   On: 01/25/2022 09:11    Lab Results: Recent Labs    01/24/22 0452 01/25/22 0509 01/26/22 0451  WBC 9.6 12.0* 4.5  HGB 9.2* 11.3* 9.7*  HCT 28.2* 36.0 29.5*  PLT 231 353 222   BMET Recent Labs    01/24/22 0452 01/25/22 0509 01/26/22 0451  NA 131* 134* 135  K 4.5 3.7 5.1  CL 100 104 105  CO2 24 21* 27  GLUCOSE 233* 168* 263*  BUN 17 16 17   CREATININE 1.08* 1.13* 1.15*  CALCIUM 8.1* 8.5* 8.6*   LFT Recent Labs    01/25/22 0509 01/26/22 0451  PROT 6.3* 5.5*  ALBUMIN 2.6* 2.2*  AST 24 16  ALT 10 10  ALKPHOS 121 93  BILITOT 0.5 0.5  BILIDIR 0.2  --   IBILI 0.3  --    PT/INR Recent Labs    01/25/22 0509  LABPROT 15.5*  INR 1.2      Patient profile:   Very complicated patient with alcohol associated chronic pancreatitis, peripancreatic/retroperitoneal infected pseudocysts status post aspiration readmitted to the hospital with sepsis secondary cellulitis from the aspiration site. S/P ultrasound-guided  aspiration of the left posterior  abdominal wall collection by interventional radiology 01/23/2022. prior to aspiration and a moderate amount of fluid drained externally by compressing this area manually.  15 mL of thick bloody purulent aspirated fluid was sent for analysis, culture/lipase results pending.  01/25/2022 I&D with general surgery   Impression/Plan:   Chronic pancreatitis with peripancreatic and retroperitoneal infected pseudocyst ID following Unasyn vancomycin CBC with slight elevation white blood cell count, hemoglobin improved, kidney liver stable. Status post I&D of abscess with general surgery Due to anesthesia restrictions, ERCP tomorrow with Dr. Meridee Score to rule out pancreatic duct leak. Will advance diet today, n.p.o. at midnight  Cirrhosis secondary to alcohol with history of esophageal and gastric varices, ascites Status post paracentesis 2.9 L no SBP Difficult fluid status with history of hyponatremia secondary to diuretics, tolerating Lasix 20 mg and Aldactone 25 mg started on the 21st, will continue at this dose and monitor renal function, in hopes of increasing over next couple days. MELD 3.0: 14 at 01/26/2022  4:51 AM MELD-Na: 10 at 01/26/2022  4:51 AM Calculated from: Serum Creatinine: 1.15 mg/dL at 4/40/3474  2:59 AM Serum Sodium: 135 mmol/L at 01/26/2022  4:51 AM Total Bilirubin: 0.5 mg/dL (Using min of 1 mg/dL) at 5/63/8756  4:33 AM Serum Albumin: 2.2 g/dL at 2/95/1884  1:66 AM INR(ratio): 1.2 at 01/25/2022  5:09 AM Age at listing (hypothetical): 61 years Sex: Female at 01/26/2022  4:51 AM  Chronic normocytic anemia CBC on 01/26/2022   WBC 4.5 HGB 9.7 (11.3)  MCV 87.3 Platelets 222 No overt GI bleeding Decreased likely secondary to I&D/procedure.  Monitor closely.  Hyponatremia NA 135 Continue diuretics Lasix 20 and Aldactone 25, monitor closely     LOS: 4 days   Doree Albee  01/26/2022, 9:10 AM

## 2022-01-26 NOTE — Progress Notes (Signed)
1 Day Post-Op   Chief Complaint/Subjective: Feels much better, minimal back discomfort  Objective: Vital signs in last 24 hours: Temp:  [96.1 F (35.6 C)-98.4 F (36.9 C)] 97.7 F (36.5 C) (09/22 0457) Pulse Rate:  [63-102] 63 (09/22 0457) Resp:  [12-18] 18 (09/22 0457) BP: (111-163)/(67-118) 111/67 (09/22 0457) SpO2:  [92 %-100 %] 100 % (09/22 0457) Last BM Date : 01/21/22 Intake/Output from previous day: 09/21 0701 - 09/22 0700 In: 1505.2 [P.O.:480; I.V.:500.2; IV Piggyback:525] Out: 10 [Blood:10] Intake/Output this shift: No intake/output data recorded.  PE: Gen: NAd Resp: nonlabored Card: RRR Abd: slight distension  Lab Results:  Recent Labs    01/25/22 0509 01/26/22 0451  WBC 12.0* 4.5  HGB 11.3* 9.7*  HCT 36.0 29.5*  PLT 353 222   BMET Recent Labs    01/25/22 0509 01/26/22 0451  NA 134* 135  K 3.7 5.1  CL 104 105  CO2 21* 27  GLUCOSE 168* 263*  BUN 16 17  CREATININE 1.13* 1.15*  CALCIUM 8.5* 8.6*   PT/INR Recent Labs    01/25/22 0509  LABPROT 15.5*  INR 1.2   CMP     Component Value Date/Time   NA 135 01/26/2022 0451   NA 136 04/09/2013 0451   K 5.1 01/26/2022 0451   K 4.1 04/09/2013 0451   CL 105 01/26/2022 0451   CL 103 04/09/2013 0451   CO2 27 01/26/2022 0451   CO2 24 04/09/2013 0451   GLUCOSE 263 (H) 01/26/2022 0451   GLUCOSE 154 (H) 04/09/2013 0451   BUN 17 01/26/2022 0451   BUN 10 04/09/2013 0451   CREATININE 1.15 (H) 01/26/2022 0451   CREATININE 1.13 04/09/2013 0451   CALCIUM 8.6 (L) 01/26/2022 0451   CALCIUM 6.7 (LL) 04/09/2013 0451   PROT 5.5 (L) 01/26/2022 0451   PROT 5.3 (L) 04/09/2013 0451   ALBUMIN 2.2 (L) 01/26/2022 0451   ALBUMIN 2.5 (L) 04/09/2013 0451   AST 16 01/26/2022 0451   AST 62 (H) 04/09/2013 0451   ALT 10 01/26/2022 0451   ALT 82 (H) 04/09/2013 0451   ALKPHOS 93 01/26/2022 0451   ALKPHOS 46 04/09/2013 0451   BILITOT 0.5 01/26/2022 0451   BILITOT 1.0 04/09/2013 0451   GFRNONAA 55 (L) 01/26/2022  0451   GFRNONAA 57 (L) 04/09/2013 0451   GFRAA 54 (L) 10/28/2018 1629   GFRAA >60 04/09/2013 0451   Lipase     Component Value Date/Time   LIPASE 18 01/22/2022 1217   LIPASE 3,352 (H) 04/09/2013 0451    Studies/Results: MR ABDOMEN MRCP W WO CONTAST  Result Date: 01/25/2022 CLINICAL DATA:  Abdominal distention. Cirrhosis and portal venous hypertension. Chronic pancreatitis with infected pseudocyst. Recent aspiration of left abdominal wall fluid collection. EXAM: MRI ABDOMEN WITHOUT AND WITH CONTRAST (INCLUDING MRCP) TECHNIQUE: Multiplanar multisequence MR imaging of the abdomen was performed both before and after the administration of intravenous contrast. Heavily T2-weighted images of the biliary and pancreatic ducts were obtained, and three-dimensional MRCP images were rendered by post processing. CONTRAST:  25mL GADAVIST GADOBUTROL 1 MMOL/ML IV SOLN COMPARISON:  CT on 01/22/2022 FINDINGS: Lower chest: No acute findings. Hepatobiliary: Hepatic cirrhosis is demonstrated. No hepatic masses identified. Large amount of ascites is seen as well as diffuse mesenteric and body wall edema. Prior cholecystectomy. Biliary ductal dilatation is seen with common bile duct measuring 17 mm in diameter. Stricture of the distal common bile duct is seen within the pancreatic head. Pancreas: No pancreatic mass identified. Pancreas is atrophic. Previously  seen large peripancreatic fluid collection adjacent to the peripancreatic tail which extends into the adjacent left posterolateral abdominal wall soft tissues has significantly decreased in size since previous study. Residual rim enhancing fluid collection seen in the left posterior abdominal wall subcutaneous tissues measures 5.3 x 3.0 cm. Spleen: Splenomegaly is again seen, consistent portal venous hypertension. Adrenals/Urinary Tract: No suspicious masses identified. No evidence of hydronephrosis. Stomach/Bowel: Unremarkable. Vascular/Lymphatic: No pathologically  enlarged lymph nodes identified. Recanalization of paraumbilical veins and portosystemic venous collaterals are seen as well as varices at the GE junction, consistent with portal venous hypertension. No acute vascular findings. Other:  None. Musculoskeletal:  No suspicious bone lesions identified. IMPRESSION: Near-complete resolution of left abdominal pancreatic pseudocyst. Residual complex fluid collection in the left posterior abdominal wall soft tissues measures 5.3 x 3.0 cm. Hepatic cirrhosis and portal venous hypertension resulting in splenomegaly and gastroesophageal varices. No evidence of hepatic neoplasm. Large amount of ascites, with diffuse mesenteric and body wall edema. Diffuse biliary ductal dilatation with stricture within the pancreatic head. No radiographic signs of pancreatic neoplasm. Electronically Signed   By: Marlaine Hind M.D.   On: 01/25/2022 09:11   MR 3D Recon At Scanner  Result Date: 01/25/2022 CLINICAL DATA:  Abdominal distention. Cirrhosis and portal venous hypertension. Chronic pancreatitis with infected pseudocyst. Recent aspiration of left abdominal wall fluid collection. EXAM: MRI ABDOMEN WITHOUT AND WITH CONTRAST (INCLUDING MRCP) TECHNIQUE: Multiplanar multisequence MR imaging of the abdomen was performed both before and after the administration of intravenous contrast. Heavily T2-weighted images of the biliary and pancreatic ducts were obtained, and three-dimensional MRCP images were rendered by post processing. CONTRAST:  6mL GADAVIST GADOBUTROL 1 MMOL/ML IV SOLN COMPARISON:  CT on 01/22/2022 FINDINGS: Lower chest: No acute findings. Hepatobiliary: Hepatic cirrhosis is demonstrated. No hepatic masses identified. Large amount of ascites is seen as well as diffuse mesenteric and body wall edema. Prior cholecystectomy. Biliary ductal dilatation is seen with common bile duct measuring 17 mm in diameter. Stricture of the distal common bile duct is seen within the pancreatic head.  Pancreas: No pancreatic mass identified. Pancreas is atrophic. Previously seen large peripancreatic fluid collection adjacent to the peripancreatic tail which extends into the adjacent left posterolateral abdominal wall soft tissues has significantly decreased in size since previous study. Residual rim enhancing fluid collection seen in the left posterior abdominal wall subcutaneous tissues measures 5.3 x 3.0 cm. Spleen: Splenomegaly is again seen, consistent portal venous hypertension. Adrenals/Urinary Tract: No suspicious masses identified. No evidence of hydronephrosis. Stomach/Bowel: Unremarkable. Vascular/Lymphatic: No pathologically enlarged lymph nodes identified. Recanalization of paraumbilical veins and portosystemic venous collaterals are seen as well as varices at the GE junction, consistent with portal venous hypertension. No acute vascular findings. Other:  None. Musculoskeletal:  No suspicious bone lesions identified. IMPRESSION: Near-complete resolution of left abdominal pancreatic pseudocyst. Residual complex fluid collection in the left posterior abdominal wall soft tissues measures 5.3 x 3.0 cm. Hepatic cirrhosis and portal venous hypertension resulting in splenomegaly and gastroesophageal varices. No evidence of hepatic neoplasm. Large amount of ascites, with diffuse mesenteric and body wall edema. Diffuse biliary ductal dilatation with stricture within the pancreatic head. No radiographic signs of pancreatic neoplasm. Electronically Signed   By: Marlaine Hind M.D.   On: 01/25/2022 09:11    Anti-infectives: Anti-infectives (From admission, onward)    Start     Dose/Rate Route Frequency Ordered Stop   01/24/22 1400  vancomycin (VANCOREADY) IVPB 1250 mg/250 mL  Status:  Discontinued  1,250 mg 166.7 mL/hr over 90 Minutes Intravenous Every 24 hours 01/24/22 0819 01/26/22 0858   01/24/22 1400  vancomycin (VANCOCIN) capsule 125 mg        125 mg Oral 2 times daily 01/24/22 1311      01/23/22 1400  vancomycin (VANCOREADY) IVPB 1500 mg/300 mL  Status:  Discontinued        1,500 mg 150 mL/hr over 120 Minutes Intravenous Every 24 hours 01/22/22 1604 01/24/22 0819   01/22/22 1800  Ampicillin-Sulbactam (UNASYN) 3 g in sodium chloride 0.9 % 100 mL IVPB        3 g 200 mL/hr over 30 Minutes Intravenous Every 6 hours 01/22/22 1714     01/22/22 1345  cefTRIAXone (ROCEPHIN) 2 g in sodium chloride 0.9 % 100 mL IVPB        2 g 200 mL/hr over 30 Minutes Intravenous  Once 01/22/22 1342 01/22/22 1430   01/22/22 1345  vancomycin (VANCOREADY) IVPB 1250 mg/250 mL        1,250 mg 166.7 mL/hr over 90 Minutes Intravenous  Once 01/22/22 1342 01/23/22 0435       Assessment/Plan  s/p Procedure(s): INCISION AND DRAINAGE BACK ABSCESS 01/25/2022    FEN - per primary team VTE - aspirin ID - unasyn Disposition - ERCP at some point   LOS: 4 days   I reviewed last 24 h vitals and pain scores, last 48 h intake and output, last 24 h labs and trends, and last 24 h imaging results.  This care required high  level of medical decision making.   De Blanch [facility] Surgery 01/26/2022, 10:07 AM Please see Amion for pager number during day hours 7:00am-4:30pm or 7:00am -11:30am on weekends

## 2022-01-26 NOTE — Progress Notes (Signed)
[facility] Infectious Diseases Follow Up Note  Patient Identification: Patient Name: Mallory Stewart MRN: [id number] Admit Date: 01/22/2022 10:46 AM Age: 61 y.o.Today's Date: 01/26/2022  Reason for Visit: Left flank cellulitis/abscess and draining wound   Principal Problem:   Sepsis (HCC) Active Problems:   HTN (hypertension)   Pancreatic abscess   Abnormal CT of the abdomen   Chronic hyponatremia   Type 2 diabetes mellitus with hyperglycemia, with long-term current use of insulin (HCC)   Pancreatic pseudocyst   Ascites   Cellulitis   Allergic reaction   Acute pancreatic fluid collection   Cellulitis of back   Infected pseudocyst of pancreas   Cellulitis and abscess of trunk   Infected pancreatic pseudocyst   Leakage from tail of pancreas   Medication monitoring encounter   Abdominal wall abscess   Atrophic pancreas   Alcohol-induced chronic pancreatitis (HCC)  Antibiotics:  Vancomycin 9/18- Ceftriaxone 9/18, Unasyn 9/18-   Lines/Hardware: PIV  Interval Events: afebrile, no leukocytosis. S/p I and D of back abscess and MRCP with following findings   Assessment # Infected Pseudocyst with associated cellulitis/abscess draining at the left lower back  - s/p I and D of back abscess 9/21. OR cx pending. Findings: abscess with connection to abdominal cavity. OR cx pending - MRCP 9/21 Near-complete resolution of left abdominal pancreatic pseudocyst. Residual complex fluid collection in the left posterior abdominal wall soft tissues measures 5.3 x 3.0 cm.   # Alcoholic Liver Cirrhosis with portal HTN/Ascites/Varices and splenomegaly  - Follows Dr Leone Payor and Liver care  - No signs of SBP on exam. S/p paracentesis with only 234 WBCs. No organisms in gram stain and NG in 2 days  - 9/19 s/p US guided aspiration of left posterior abdominal wall collection. Findings " Thick bloody purulent fluid aspirated with Yueh catheter, 15  ml.  Thin yellow fluid draining from the small pustules on skin". Cx with Kleb pneumoniae   # Medication monitoring - Cr appears stable  # C diff July 2023, treated : 1st episode - On prophylactic Vancomycin   Recommendations Will DC Vancomycin and continue Unasyn given likely culprit is the Kleb pneumo which was isolated from the abdominal fluid previously. MRSA PCR negative Fu IR aspirate, paracentesis and OR cultures  Plan for possible ERCP noted tomorrow Monitor CBC and BMP  Fu surgery and GI recs   Dr Drue Second covering this weekend and will follow cultures. New ID team will follow starting Monday   Rest of the management as per the primary team. Thank you for the consult. Please page with pertinent questions or concerns.  ______________________________________________________________________ Subjective patient seen and examined at the bedside.  She feels better after her I and D  Denies nausea, vomiting, abdominal pain and diarrhea   Vitals BP 111/67 (BP Location: Left Arm)   Pulse 63   Temp 97.7 F (36.5 C) (Oral)   Resp 18   Ht 5\' 9"  (1.753 m)   Wt 70.4 kg   SpO2 100%   BMI 22.92 kg/m     Physical Exam Constitutional:  Sitting up in the chair and appears comfortable     Comments:   Cardiovascular:     Rate and Rhythm: Normal rate and regular rhythm.     Heart sounds:   Pulmonary:     Effort: Pulmonary effort is normal.     Comments:   Abdominal:     Palpations: Abdomen is soft.     Tenderness: non distended. Non tender  Musculoskeletal:        General: No swelling or tenderness in peripheral joints   Skin:    Comments: Left flank covered in a surgical dressing( C/D/I) mild surrounding redness/no fluctuation  Neurological:     General: awake, alert and oriented and spontaneously moving all extremities   Psychiatric:        Mood and Affect: Mood normal.   Pertinent Microbiology Results for orders placed or performed during the hospital encounter  of 01/22/22  Blood Culture (routine x 2)     Status: None (Preliminary result)   Collection Time: 01/22/22 12:17 PM   Specimen: BLOOD  Result Value Ref Range Status   Specimen Description   Final    BLOOD RIGHT ANTECUBITAL Performed at [facility], [address], [place], [place] [address]    Special Requests   Final    BOTTLES DRAWN AEROBIC AND ANAEROBIC Blood Culture adequate volume Performed at [facility], [address], [place], [place] [address]    Culture   Final    NO GROWTH 3 DAYS Performed at [facility], [address] [place], [place] [address]    Report Status PENDING  Incomplete  Blood Culture (routine x 2)     Status: None (Preliminary result)   Collection Time: 01/22/22  5:06 PM   Specimen: BLOOD  Result Value Ref Range Status   Specimen Description   Final    BLOOD SITE NOT SPECIFIED Performed at [facility], [address] [place], [place] [address]    Special Requests   Final    BOTTLES DRAWN AEROBIC ONLY Blood Culture adequate volume Performed at [facility], [address], [place], [place] [address]    Culture   Final    NO GROWTH 3 DAYS Performed at [facility], [address], [place], [place] [address]    Report Status PENDING  Incomplete  Aerobic/Anaerobic Culture w Gram Stain (surgical/deep wound)     Status: None (Preliminary result)   Collection Time: 01/23/22 10:06 AM   Specimen: PATH Cytology Peritoneal fluid  Result Value Ref Range Status   Specimen Description   Final    PERITONEAL Performed at [facility], [address], [place], [place] [address]    Special Requests   Final    NONE Performed at [facility], [address] [place], [place] [address]    Gram Stain NO WBC SEEN NO ORGANISMS SEEN   Final   Culture   Final    NO GROWTH 2 DAYS NO ANAEROBES ISOLATED; CULTURE IN PROGRESS FOR 5  DAYS Performed at [facility], [address], [place], [place] [address]    Report Status PENDING  Incomplete  Aerobic/Anaerobic Culture w Gram Stain (surgical/deep wound)     Status: None (Preliminary result)   Collection Time: 01/23/22  4:59 PM   Specimen: Abscess  Result Value Ref Range Status   Specimen Description   Final    ABSCESS Performed at [facility], [address], [place], [place] [address]    Special Requests   Final    NONE Performed at [facility], [address], [place], [place] [address]    Gram Stain   Final    ABUNDANT WBC PRESENT, PREDOMINANTLY PMN NO ORGANISMS SEEN Performed at [facility], [address] [place], [place] [address]    Culture   Final  MODERATE KLEBSIELLA PNEUMONIAE SUSCEPTIBILITIES TO FOLLOW NO ANAEROBES ISOLATED; CULTURE IN PROGRESS FOR 5 DAYS    Report Status PENDING  Incomplete  Surgical pcr screen     Status: None   Collection Time: 01/25/22  7:46 AM   Specimen: Nasal Mucosa; Nasal Swab  Result Value Ref Range Status   MRSA, PCR NEGATIVE NEGATIVE Final   Staphylococcus aureus NEGATIVE NEGATIVE Final    Comment: (NOTE) The Xpert SA Assay (FDA approved for NASAL specimens in patients 32 years of age and older), is one component of a comprehensive surveillance program. It is not intended to diagnose infection nor to guide or monitor treatment. Performed at [facility], [place] [address], [place], [place] [address]   Aerobic/Anaerobic Culture w Gram Stain (surgical/deep wound)     Status: None (Preliminary result)   Collection Time: 01/25/22  3:16 PM   Specimen: PATH Other; Tissue  Result Value Ref Range Status   Specimen Description   Final    ABSCESS Performed at [place] [address], [place], [place] [address]    Special Requests   Final    NONE BACK Performed at [facility] [address],  [place], [place] [address]    Gram Stain   Final    NO WBC SEEN NO ORGANISMS SEEN Performed at [facility], [place] [address] [place], [place] [address]    Culture PENDING  Incomplete   Report Status PENDING  Incomplete   Pertinent Lab.    Latest Ref Rng & Units 01/26/2022    4:51 AM 01/25/2022    5:09 AM 01/24/2022    4:52 AM  CBC  WBC 4.0 - 10.5 K/uL 4.5  12.0  9.6   Hemoglobin 12.0 - 15.0 g/dL 9.7  11.3  9.2   Hematocrit 36.0 - 46.0 % 29.5  36.0  28.2   Platelets 150 - 400 K/uL 222  353  231       Latest Ref Rng & Units 01/26/2022    4:51 AM 01/25/2022    5:09 AM 01/24/2022    4:52 AM  CMP  Glucose 70 - 99 mg/dL 263  168  233   BUN 6 - 20 mg/dL 17  16  17    Creatinine 0.44 - 1.00 mg/dL 1.15  1.13  1.08   Sodium 135 - 145 mmol/L 135  134  131   Potassium 3.5 - 5.1 mmol/L 5.1  3.7  4.5   Chloride 98 - 111 mmol/L 105  104  100   CO2 22 - 32 mmol/L 27  21  24    Calcium 8.9 - 10.3 mg/dL 8.6  8.5  8.1   Total Protein 6.5 - 8.1 g/dL 5.5  6.3    Total Bilirubin 0.3 - 1.2 mg/dL 0.5  0.5    Alkaline Phos 38 - 126 U/L 93  121    AST 15 - 41 U/L 16  24    ALT 0 - 44 U/L 10  10       Pertinent Imaging today Plain films and CT images have been personally visualized and interpreted; radiology reports have been reviewed. Decision making incorporated into the Impression / Recommendations.  No results found.   I spent 50 minutes for this patient encounter including review of prior medical records, coordination of care with primary/other specialist with greater than 50% of time being face to face/counseling and discussing diagnostics/treatment plan with the patient/family.  Electronically signed by:  Rosiland Oz, MD Infectious Disease Physician [facility] for Infectious Disease Pager: [phone]

## 2022-01-26 NOTE — Inpatient Diabetes Management (Signed)
Inpatient Diabetes Program Recommendations  AACE/ADA: New Consensus Statement on Inpatient Glycemic Control   Target Ranges:  Prepandial:   less than 140 mg/dL      Peak postprandial:   less than 180 mg/dL (1-2 hours)      Critically ill patients:  140 - 180 mg/dL    Latest Reference Range & Units 01/26/22 07:58 01/26/22 11:03  Glucose-Capillary 70 - 99 mg/dL 207 (H) 214 (H)    Latest Reference Range & Units 01/25/22 07:27 01/25/22 11:47 01/25/22 15:21 01/25/22 17:02 01/25/22 21:25  Glucose-Capillary 70 - 99 mg/dL 155 (H) 109 (H) 177 (H) 117 (H) 290 (H)   Review of Glycemic Control  Diabetes history: DM2 Outpatient Diabetes medications: Basaglar 30 units daily, Humalog 2-10 units TID with meals Current orders for Inpatient glycemic control: Semglee 15 units daily, Novolog 0-9 units TID with meals, Novolog 0-5 units QHS  Inpatient Diabetes Program Recommendations:    Insulin: Noted patient received Decadron 10 mg at 15:14 on 01/25/22 which is contributing to hyperglycemia. If post prandial glucose remains consistently over 180 mg/dl, please consider ordering Novolog 3 units TID with meals for meal coverage if patient eats at least 50% of meals.  Thanks, Barnie Alderman, RN, MSN, [facility] Coordinator Inpatient Diabetes Program [phone] (Team Pager from 8am to [place])

## 2022-01-27 ENCOUNTER — Encounter (HOSPITAL_COMMUNITY): Payer: Self-pay | Admitting: Internal Medicine

## 2022-01-27 ENCOUNTER — Inpatient Hospital Stay (HOSPITAL_COMMUNITY): Payer: 59

## 2022-01-27 ENCOUNTER — Encounter (HOSPITAL_COMMUNITY)
Admission: EM | Disposition: A | Discharge status: Home / Self Care | Payer: Self-pay | Source: Home / Self Care | Attending: Internal Medicine

## 2022-01-27 ENCOUNTER — Inpatient Hospital Stay (HOSPITAL_COMMUNITY): Payer: 59 | Admitting: Certified Registered"

## 2022-01-27 DIAGNOSIS — K8689 Other specified diseases of pancreas: Secondary | ICD-10-CM

## 2022-01-27 DIAGNOSIS — A419 Sepsis, unspecified organism: Secondary | ICD-10-CM | POA: Diagnosis not present

## 2022-01-27 DIAGNOSIS — K838 Other specified diseases of biliary tract: Secondary | ICD-10-CM

## 2022-01-27 DIAGNOSIS — K831 Obstruction of bile duct: Secondary | ICD-10-CM | POA: Diagnosis not present

## 2022-01-27 DIAGNOSIS — K863 Pseudocyst of pancreas: Secondary | ICD-10-CM

## 2022-01-27 DIAGNOSIS — I10 Essential (primary) hypertension: Secondary | ICD-10-CM

## 2022-01-27 DIAGNOSIS — R6520 Severe sepsis without septic shock: Secondary | ICD-10-CM | POA: Diagnosis not present

## 2022-01-27 HISTORY — PX: SPHINCTEROTOMY: SHX5279

## 2022-01-27 HISTORY — PX: ERCP: SHX5425

## 2022-01-27 HISTORY — PX: PANCREATIC STENT PLACEMENT: SHX5539

## 2022-01-27 HISTORY — PX: REMOVAL OF STONES: SHX5545

## 2022-01-27 HISTORY — PX: BILIARY BRUSHING: SHX6843

## 2022-01-27 LAB — CBC WITH DIFFERENTIAL/PLATELET
Abs Immature Granulocytes: 0.05 10*3/uL (ref 0.00–0.07)
Basophils Absolute: 0 10*3/uL (ref 0.0–0.1)
Basophils Relative: 0 %
Eosinophils Absolute: 0.2 10*3/uL (ref 0.0–0.5)
Eosinophils Relative: 2 %
HCT: 29.7 % — ABNORMAL LOW (ref 36.0–46.0)
Hemoglobin: 9.6 g/dL — ABNORMAL LOW (ref 12.0–15.0)
Immature Granulocytes: 1 %
Lymphocytes Relative: 23 %
Lymphs Abs: 1.8 10*3/uL (ref 0.7–4.0)
MCH: 28.5 pg (ref 26.0–34.0)
MCHC: 32.3 g/dL (ref 30.0–36.0)
MCV: 88.1 fL (ref 80.0–100.0)
Monocytes Absolute: 0.5 10*3/uL (ref 0.1–1.0)
Monocytes Relative: 7 %
Neutro Abs: 5.3 10*3/uL (ref 1.7–7.7)
Neutrophils Relative %: 67 %
Platelets: 247 10*3/uL (ref 150–400)
RBC: 3.37 MIL/uL — ABNORMAL LOW (ref 3.87–5.11)
RDW: 13.3 % (ref 11.5–15.5)
WBC: 7.8 10*3/uL (ref 4.0–10.5)
nRBC: 0 % (ref 0.0–0.2)

## 2022-01-27 LAB — COMPREHENSIVE METABOLIC PANEL
ALT: 11 U/L (ref 0–44)
AST: 17 U/L (ref 15–41)
Albumin: 2.2 g/dL — ABNORMAL LOW (ref 3.5–5.0)
Alkaline Phosphatase: 83 U/L (ref 38–126)
Anion gap: 4 — ABNORMAL LOW (ref 5–15)
BUN: 16 mg/dL (ref 6–20)
CO2: 26 mmol/L (ref 22–32)
Calcium: 8.4 mg/dL — ABNORMAL LOW (ref 8.9–10.3)
Chloride: 106 mmol/L (ref 98–111)
Creatinine, Ser: 1.05 mg/dL — ABNORMAL HIGH (ref 0.44–1.00)
GFR, Estimated: >60 mL/min (ref >60)
Glucose, Bld: 167 mg/dL — ABNORMAL HIGH (ref 70–99)
Potassium: 3.9 mmol/L (ref 3.5–5.1)
Sodium: 136 mmol/L (ref 135–145)
Total Bilirubin: 0.3 mg/dL (ref 0.3–1.2)
Total Protein: 5.7 g/dL — ABNORMAL LOW (ref 6.5–8.1)

## 2022-01-27 LAB — GLUCOSE, CAPILLARY
Glucose-Capillary: 126 mg/dL — ABNORMAL HIGH (ref 70–99)
Glucose-Capillary: 138 mg/dL — ABNORMAL HIGH (ref 70–99)
Glucose-Capillary: 177 mg/dL — ABNORMAL HIGH (ref 70–99)
Glucose-Capillary: 170 mg/dL — ABNORMAL HIGH (ref 70–99)
Glucose-Capillary: 261 mg/dL — ABNORMAL HIGH (ref 70–99)

## 2022-01-27 LAB — CULTURE, BLOOD (ROUTINE X 2)
Culture: NO GROWTH
Culture: NO GROWTH
Special Requests: ADEQUATE
Special Requests: ADEQUATE

## 2022-01-27 LAB — PROTIME-INR
INR: 1.3 — ABNORMAL HIGH (ref 0.8–1.2)
Prothrombin Time: 15.7 seconds — ABNORMAL HIGH (ref 11.4–15.2)

## 2022-01-27 SURGERY — ERCP, WITH INTERVENTION IF INDICATED
Anesthesia: General

## 2022-01-27 MED ORDER — ROCURONIUM BROMIDE 10 MG/ML (PF) SYRINGE
PREFILLED_SYRINGE | INTRAVENOUS | Status: DC | PRN
  Administered 2022-01-27: 100 mg via INTRAVENOUS

## 2022-01-27 MED ORDER — PROPOFOL 10 MG/ML IV BOLUS
INTRAVENOUS | Status: AC
  Filled 2022-01-27: qty 20

## 2022-01-27 MED ORDER — FENTANYL CITRATE (PF) 100 MCG/2ML IJ SOLN
INTRAMUSCULAR | Status: AC
  Filled 2022-01-27: qty 2

## 2022-01-27 MED ORDER — PROPOFOL 10 MG/ML IV BOLUS
INTRAVENOUS | Status: DC | PRN
  Administered 2022-01-27: 150 mg via INTRAVENOUS

## 2022-01-27 MED ORDER — INDOMETHACIN 50 MG RE SUPP
RECTAL | Status: DC | PRN
  Administered 2022-01-27: 100 mg via RECTAL

## 2022-01-27 MED ORDER — LIDOCAINE 2% (20 MG/ML) 5 ML SYRINGE
INTRAMUSCULAR | Status: DC | PRN
  Administered 2022-01-27: 50 mg via INTRAVENOUS

## 2022-01-27 MED ORDER — INDOMETHACIN 50 MG RE SUPP
RECTAL | Status: AC
  Filled 2022-01-27: qty 1

## 2022-01-27 MED ORDER — PHENYLEPHRINE HCL (PRESSORS) 10 MG/ML IV SOLN
INTRAVENOUS | Status: AC
  Filled 2022-01-27: qty 1

## 2022-01-27 MED ORDER — ONDANSETRON HCL 4 MG/2ML IJ SOLN
INTRAMUSCULAR | Status: DC | PRN
  Administered 2022-01-27: 4 mg via INTRAVENOUS

## 2022-01-27 MED ORDER — GLUCAGON HCL RDNA (DIAGNOSTIC) 1 MG IJ SOLR
INTRAMUSCULAR | Status: DC | PRN
  Administered 2022-01-27 (×3): .25 mg via INTRAVENOUS

## 2022-01-27 MED ORDER — SODIUM CHLORIDE 0.9 % IV SOLN
INTRAVENOUS | Status: DC | PRN
  Administered 2022-01-27: 15 mL

## 2022-01-27 MED ORDER — MIDAZOLAM HCL 2 MG/2ML IJ SOLN
INTRAMUSCULAR | Status: AC
  Filled 2022-01-27: qty 2

## 2022-01-27 MED ORDER — SUGAMMADEX SODIUM 200 MG/2ML IV SOLN
INTRAVENOUS | Status: DC | PRN
  Administered 2022-01-27: 200 mg via INTRAVENOUS

## 2022-01-27 MED ORDER — MIDAZOLAM HCL 5 MG/5ML IJ SOLN
INTRAMUSCULAR | Status: DC | PRN
  Administered 2022-01-27: 2 mg via INTRAVENOUS

## 2022-01-27 MED ORDER — LACTATED RINGERS IV SOLN: INTRAVENOUS | Status: DC | PRN

## 2022-01-27 MED ORDER — DEXAMETHASONE SODIUM PHOSPHATE 4 MG/ML IJ SOLN
INTRAMUSCULAR | Status: DC | PRN
  Administered 2022-01-27: 5 mg via INTRAVENOUS

## 2022-01-27 MED ORDER — FENTANYL CITRATE (PF) 100 MCG/2ML IJ SOLN
INTRAMUSCULAR | Status: DC | PRN
  Administered 2022-01-27 (×2): 50 ug via INTRAVENOUS
  Administered 2022-01-27: 100 ug via INTRAVENOUS

## 2022-01-27 MED ORDER — PHENYLEPHRINE 80 MCG/ML (10ML) SYRINGE FOR IV PUSH (FOR BLOOD PRESSURE SUPPORT)
PREFILLED_SYRINGE | INTRAVENOUS | Status: DC | PRN
  Administered 2022-01-27: 120 ug via INTRAVENOUS

## 2022-01-27 NOTE — Anesthesia Procedure Notes (Signed)
Procedure Name: Intubation Date/Time: 01/27/2022 11:23 AM  Performed by: Claudia Desanctis, CRNAPre-anesthesia Checklist: Patient identified, Emergency Drugs available, Suction available and Patient being monitored Patient Re-evaluated:Patient Re-evaluated prior to induction Oxygen Delivery Method: Circle system utilized Preoxygenation: Pre-oxygenation with 100% oxygen Induction Type: IV induction Ventilation: Mask ventilation without difficulty Laryngoscope Size: Mac and 4 Grade View: Grade I Tube type: Oral Tube size: 7.0 mm Number of attempts: 1 Airway Equipment and Method: Stylet Placement Confirmation: ETT inserted through vocal cords under direct vision, positive ETCO2 and breath sounds checked- equal and bilateral Secured at: 21 cm Tube secured with: Tape Dental Injury: Teeth and Oropharynx as per pre-operative assessment

## 2022-01-27 NOTE — Progress Notes (Signed)
Mallory Stewart  [id number] DOB: [birth year] DOA: 01/22/2022 PCP: [name], MD   Brief Narrative:  Patient is a 61 year old female with history of chronic pancreatitis with pseudocyst with extension into the retroperitoneum, type 2 diabetes, cirrhosis, hypertension who presented with abdominal distention, erythema of the left flank, chills, draining from left flank.  No history of fever.  CT imaging showed features of cellulitis of the left flank, large amount of ascites , a large peripherally enhancing complex fluid collection  within the left posterior pararenal space, with posterior extension through the posterior left abdominal wall.  General surgery, IR, GI, ID consulted and following.  Currently on broad-spectrum antibiotics.  General surgery did  incision and drainage of abscess on the back on 9/21.  GI  planning for ERCP to rule out pancreatic duct leak today  Assessment & Plan:  Principal Problem:   Sepsis ([place]) Active Problems:   Ascites   HTN (hypertension)   Pancreatic abscess   Abnormal CT of the abdomen   Chronic hyponatremia   Type 2 diabetes mellitus with hyperglycemia, with long-term current use of insulin (HCC)   Pancreatic pseudocyst   Cellulitis   Allergic reaction   Acute pancreatic fluid collection   Cellulitis of back   Infected pseudocyst of pancreas   Cellulitis and abscess of trunk   Infected pancreatic pseudocyst   Leakage from tail of pancreas   Medication monitoring encounter   Abdominal wall abscess   Atrophic pancreas   Alcohol-induced chronic pancreatitis ([place])   Pancreatic duct leak  Sepsis secondary to cellulitis, draining pseudocyst: Presented with tachycardia, tachypnea, elevated lactate.  Found to have cellulitis of the left flank, large amount of ascites , a large peripherally enhancing complex fluid collection  within the left posterior pararenal space, with posterior extension through the posterior left abdominal  wall.  Started on broad-spectrum antibiotics.  IR consulted and she underwent ultrasound-guided aspiration of the left posterior abdominal wall collection.  Fluid was bloody, purulent.  Aerobic/anaerobic cultures showing moderate Klebsiella pneumoniae. Currently on Unasyn. General surgery also consulted for cellulitis.She had a draining wound on her left back,S/p  I&D on 0000000   Alcoholic cirrhosis/pancreatic pseudocyst: GI following.  Has history of portal hypertension.  Has history of chronic alcoholic pancreatitis with recent peripancreatic/retroperitoneal infected pseudocyst/abscess.  MRCP showed near-complete resolution of left abdominal pancreatic pseudocyst,hepatic cirrhosis and portal venous hypertension resulting in splenomegaly and gastroesophageal varices,diffuse biliary ductal dilatation with stricture within the pancreatic head. No radiographic signs of pancreatic neoplasm. Abdomen is not tense, will check if she needs paracentesis during this hospitalization. Plan for ERCP to  rule out pancreatic duct leak on 9.23  Type 2 diabetes: Continue current insulin regimen.  Monitor blood sugars  Hypertension: On Lasix, spironolactone at home.  resumed  Chronic hyponatremia: Likely from cirrhosis.  Sodium remained stable around 130  Hypomagnesemia: Supplemented with magnesium         DVT prophylaxis:SCDs Start: 01/22/22 1701     Code Status: Full Code  Family Communication: None at bedside  Patient status:Inpatient  Patient is from :Home  Anticipated discharge NE:[id number]  Estimated DC date:1-2 days   Consultants: GI,IR  Procedures:I and D  Antimicrobials:  Anti-infectives (From admission, onward)    Start     Dose/Rate Route Frequency Ordered Stop   01/24/22 1400  vancomycin (VANCOREADY) IVPB 1250 mg/250 mL  Status:  Discontinued        1,250 mg 166.7 mL/hr over 90 Minutes Intravenous Every 24 hours  01/24/22 0819 01/26/22 0858   01/24/22 1400  [MAR Hold]   vancomycin (VANCOCIN) capsule 125 mg        (MAR Hold since Sat 01/27/2022 at 1057.Hold Reason: Transfer to a Procedural area)   125 mg Oral 2 times daily 01/24/22 1311     01/23/22 1400  vancomycin (VANCOREADY) IVPB 1500 mg/300 mL  Status:  Discontinued        1,500 mg 150 mL/hr over 120 Minutes Intravenous Every 24 hours 01/22/22 1604 01/24/22 0819   01/22/22 1800  [MAR Hold]  Ampicillin-Sulbactam (UNASYN) 3 g in sodium chloride 0.9 % 100 mL IVPB        (MAR Hold since Sat 01/27/2022 at 1057.Hold Reason: Transfer to a Procedural area)   3 g 200 mL/hr over 30 Minutes Intravenous Every 6 hours 01/22/22 1714     01/22/22 1345  cefTRIAXone (ROCEPHIN) 2 g in sodium chloride 0.9 % 100 mL IVPB        2 g 200 mL/hr over 30 Minutes Intravenous  Once 01/22/22 1342 01/22/22 1430   01/22/22 1345  vancomycin (VANCOREADY) IVPB 1250 mg/250 mL        1,250 mg 166.7 mL/hr over 90 Minutes Intravenous  Once 01/22/22 1342 01/23/22 0435       Subjective: Patient seen and examined at bedside today.  Hemodynamically stable.  Comfortable.  Took a shower.  Waiting for ERCP  Objective: Vitals:   01/26/22 1301 01/26/22 2122 01/27/22 0533 01/27/22 1104  BP: 120/75 119/81 135/88 (!) 175/102  Pulse: 71 69 62 81  Resp: 17 18 18 18   Temp: 97.8 F (36.6 C) 98 F (36.7 C) 97.8 F (36.6 C) 97.6 F (36.4 C)  TempSrc: Oral Oral Oral Temporal  SpO2: 100% 98% 98% 98%  Weight:      Height:        Intake/Output Summary (Last 24 hours) at 01/27/2022 1143 Last data filed at 01/27/2022 1128 Gross per 24 hour  Intake 2073.97 ml  Output --  Net 2073.97 ml   Filed Weights   01/22/22 1725  Weight: 70.4 kg    Examination:   General exam: Overall comfortable, not in distress HEENT: PERRL Respiratory system:  no wheezes or crackles  Cardiovascular system: S1 & S2 heard, RRR.  Gastrointestinal system: Abdomen is nondistended, soft and nontender. Central nervous system: Alert and oriented Extremities: No edema,  no clubbing ,no cyanosis Skin: Cellulitis wound covered with dressing on the left back   Data Reviewed: I have personally reviewed following labs and imaging studies  CBC: Recent Labs  Lab 01/22/22 1217 01/23/22 0508 01/24/22 0452 01/25/22 0509 01/26/22 0451 01/27/22 0523  WBC 15.7* 10.1 9.6 12.0* 4.5 7.8  NEUTROABS 13.6*  --   --   --   --  5.3  HGB 10.0* 9.6* 9.2* 11.3* 9.7* 9.6*  HCT 30.7* 29.7* 28.2* 36.0 29.5* 29.7*  MCV 87.7 88.9 87.9 91.4 87.3 88.1  PLT 261 217 231 353 222 [id number]   Basic Metabolic Panel: Recent Labs  Lab 01/23/22 0508 01/24/22 0452 01/25/22 0509 01/26/22 0451 01/27/22 0523  NA 130* 131* 134* 135 136  K 4.2 4.5 3.7 5.1 3.9  CL 98 100 104 105 106  CO2 23 24 21* 27 26  GLUCOSE 218* 233* 168* 263* 167*  BUN 12 17 16 17 16   CREATININE 1.04* 1.08* 1.13* 1.15* 1.05*  CALCIUM 8.2* 8.1* 8.5* 8.6* 8.4*  MG  --  1.5* 1.8  --   --   PHOS  --  3.7  --   --   --      Recent Results (from the past 240 hour(s))  Blood Culture (routine x 2)     Status: None   Collection Time: 01/22/22 12:17 PM   Specimen: BLOOD  Result Value Ref Range Status   Specimen Description   Final    BLOOD RIGHT ANTECUBITAL Performed at [facility] [address] [place], [place] [address]    Special Requests   Final    BOTTLES DRAWN AEROBIC AND ANAEROBIC Blood Culture adequate volume Performed at [facility] [address], [place], [place] [address]    Culture   Final    NO GROWTH 5 DAYS Performed at [facility], [place] [address] [place], [place] [address]    Report Status 01/27/2022 FINAL  Final  Blood Culture (routine x 2)     Status: None   Collection Time: 01/22/22  5:06 PM   Specimen: BLOOD  Result Value Ref Range Status   Specimen Description   Final    BLOOD SITE NOT SPECIFIED Performed at [facility] [address], [place], [place] [address]    Special Requests   Final    BOTTLES DRAWN  AEROBIC ONLY Blood Culture adequate volume Performed at [facility] [address], [place], [place] [address]    Culture   Final    NO GROWTH 5 DAYS Performed at [facility], [place] [address] [place], [place] [address]    Report Status 01/27/2022 FINAL  Final  Aerobic/Anaerobic Culture w Gram Stain (surgical/deep wound)     Status: None (Preliminary result)   Collection Time: 01/23/22 10:06 AM   Specimen: PATH Cytology Peritoneal fluid  Result Value Ref Range Status   Specimen Description   Final    PERITONEAL Performed at [facility] [address], [place], [place] [address]    Special Requests   Final    NONE Performed at [facility], [place] [address], [place], [place] [address]    Gram Stain NO WBC SEEN NO ORGANISMS SEEN   Final   Culture   Final    NO GROWTH 4 DAYS NO ANAEROBES ISOLATED; CULTURE IN [place] FOR 5 DAYS Performed at [facility] [address] [place], [place] [address]    Report Status PENDING  Incomplete  Aerobic/Anaerobic Culture w Gram Stain (surgical/deep wound)     Status: None (Preliminary result)   Collection Time: 01/23/22  4:59 PM   Specimen: Abscess  Result Value Ref Range Status   Specimen Description   Final    ABSCESS Performed at [facility] [address], [place], [place] [address]    Special Requests   Final    NONE Performed at [facility], [place] [address] [place], [place] [address]    Gram Stain   Final    ABUNDANT WBC PRESENT, PREDOMINANTLY PMN NO ORGANISMS SEEN Performed at [facility], [place] [address] [place], [place] [address]    Culture   Final    MODERATE KLEBSIELLA PNEUMONIAE NO ANAEROBES ISOLATED; CULTURE IN [place] FOR 5 DAYS    Report Status PENDING  Incomplete   Organism ID, Bacteria KLEBSIELLA PNEUMONIAE  Final      Susceptibility   Klebsiella pneumoniae - MIC*    AMPICILLIN >=32 RESISTANT  Resistant     CEFAZOLIN <=4 SENSITIVE Sensitive     CEFEPIME <=  0.12 SENSITIVE Sensitive     CEFTAZIDIME <=1 SENSITIVE Sensitive     CEFTRIAXONE <=0.25 SENSITIVE Sensitive     CIPROFLOXACIN <=0.25 SENSITIVE Sensitive     GENTAMICIN <=1 SENSITIVE Sensitive     IMIPENEM <=0.25 SENSITIVE Sensitive     TRIMETH/SULFA <=20 SENSITIVE Sensitive     AMPICILLIN/SULBACTAM 4 SENSITIVE Sensitive     PIP/TAZO <=4 SENSITIVE Sensitive     * MODERATE KLEBSIELLA PNEUMONIAE  Surgical pcr screen     Status: None   Collection Time: 01/25/22  7:46 AM   Specimen: Nasal Mucosa; Nasal Swab  Result Value Ref Range Status   MRSA, PCR NEGATIVE NEGATIVE Final   Staphylococcus aureus NEGATIVE NEGATIVE Final    Comment: (NOTE) The Xpert SA Assay (FDA approved for NASAL specimens in patients 91 years of age and older), is one component of a comprehensive surveillance program. It is not intended to diagnose infection nor to guide or monitor treatment. Performed at [facility], [place] [address] [place], [place] [address]   Aerobic/Anaerobic Culture w Gram Stain (surgical/deep wound)     Status: None (Preliminary result)   Collection Time: 01/25/22  3:16 PM   Specimen: PATH Other; Tissue  Result Value Ref Range Status   Specimen Description   Final    ABSCESS Performed at [facility] [address] [place], [place] [address]    Special Requests   Final    NONE BACK Performed at [facility] [address] [place], [place] [address]    Gram Stain   Final    NO WBC SEEN NO ORGANISMS SEEN Performed at [facility], [place] [address], [place], [place] [address]    Culture RARE KLEBSIELLA PNEUMONIAE  Final   Report Status PENDING  Incomplete   Organism ID, Bacteria KLEBSIELLA PNEUMONIAE  Final      Susceptibility   Klebsiella pneumoniae - MIC*    AMPICILLIN >=32 RESISTANT Resistant     CEFAZOLIN <=4 SENSITIVE Sensitive     CEFEPIME <=0.12  SENSITIVE Sensitive     CEFTAZIDIME <=1 SENSITIVE Sensitive     CEFTRIAXONE <=0.25 SENSITIVE Sensitive     CIPROFLOXACIN <=0.25 SENSITIVE Sensitive     GENTAMICIN <=1 SENSITIVE Sensitive     IMIPENEM <=0.25 SENSITIVE Sensitive     TRIMETH/SULFA <=20 SENSITIVE Sensitive     AMPICILLIN/SULBACTAM 4 SENSITIVE Sensitive     PIP/TAZO <=4 SENSITIVE Sensitive     * RARE KLEBSIELLA PNEUMONIAE     Radiology Studies: No results found.  Scheduled Meds:  [MAR Hold] aspirin EC  81 mg Oral Daily   [MAR Hold] furosemide  20 mg Oral QODAY   [MAR Hold] insulin aspart  0-5 Units Subcutaneous QHS   [MAR Hold] insulin aspart  0-9 Units Subcutaneous TID WC   [MAR Hold] insulin glargine-yfgn  15 Units Subcutaneous Daily   [MAR Hold] pantoprazole  40 mg Oral BID   [MAR Hold] spironolactone  25 mg Oral Daily   [MAR Hold] vancomycin  125 mg Oral BID   Continuous Infusions:  sodium chloride 20 mL/hr at 01/25/22 1805   [MAR Hold] ampicillin-sulbactam (UNASYN) IV 3 g (01/27/22 0534)     LOS: 5 days   Shelly Coss, MD [facility] P9/23/2023, 11:43 AM

## 2022-01-27 NOTE — Op Note (Signed)
[facility] Patient Name: Mallory Stewart Procedure Date: 01/27/2022 MRN: [id number] Attending MD: Justice Britain , MD Date of Birth: 10/21/1960 CSN: [id number] Age: 61 Admit Type: Inpatient Procedure:                ERCP Indications:              Common bile duct stricture, Pancreatic duct leak,                            Pancreatic duct stricture, Pancreatic pseudocyst Providers:                Justice Britain, MD, Benay Pillow, RN, Cletis Athens, Technician Referring MD:             Gatha Mayer, MD, Inpatient Medical/Surgical                            services Medicines:                General Anesthesia, Unasyn 3 g IV, Indomethacin 100                            mg PR, Glucagon 3.22 mg IV Complications:            No immediate complications. Estimated Blood Loss:     Estimated blood loss was minimal. Procedure:                Pre-Anesthesia Assessment:                           - Prior to the procedure, a History and Physical                            was performed, and patient medications and                            allergies were reviewed. The patient's tolerance of                            previous anesthesia was also reviewed. The risks                            and benefits of the procedure and the sedation                            options and risks were discussed with the patient.                            All questions were answered, and informed consent                            was obtained. Prior Anticoagulants: The patient has  taken no previous anticoagulant or antiplatelet                            agents. ASA Grade Assessment: III - A patient with                            severe systemic disease. After reviewing the risks                            and benefits, the patient was deemed in                            satisfactory condition to undergo the procedure.                            After obtaining informed consent, the scope was                            passed under direct vision. Throughout the                            procedure, the patient's blood pressure, pulse, and                            oxygen saturations were monitored continuously. The                            TJF-Q190V ([id number]) Olympus duodenoscope was                            introduced through the mouth, and used to inject                            contrast into and used to inject contrast into the                            bile duct and ventral pancreatic duct. The ERCP was                            technically difficult and complex due to                            challenging cannulation. Successful completion of                            the procedure was aided by performing the maneuvers                            documented (below) in this report. The patient                            tolerated the procedure. Findings:      A scout film of the abdomen was obtained. Surgical clips, consistent  with a previous cholecystectomy, were seen in the area of the right       upper quadrant of the abdomen.      The esophagus was successfully intubated under direct vision without       detailed examination of the pharynx, larynx, and associated structures,       and upper GI tract. The major papilla was normal.      A short 0.035 inch Soft Jagwire was passed into the ventral pancreatic       duct but did not go deep into the duct unfortunately and was stuck in       the ventral head region. The ventral pancreatic duct was then cannulated       with the Hydratome sphincterotome. Contrast was injected. I personally       interpreted the pancreatic duct images. The flow of contrast through the       ducts was poor. Image quality was adequate. Contrast extended to the       distal pancreatic duct. Opacification of the ventral pancreatic duct in       the head of the pancreas  was incomplete. The maximum diameter of the       ducts was 2 mm. The ventral pancreatic duct in the head of the pancreas       contained what appeared to be a stenosis. To try and aid in not losing       this region, a 4 mm ventral pancreatic sphincterotomy was made with a       monofilament Hydratome sphincterotome using ERBE electrocautery. There       was no post-sphincterotomy bleeding. Further attempts with wire passage       were not possible. I left the current wire in place.      Although, not the overt intention of today's procedure, a second short       0.035 inch Soft Jagwire was passed into the biliary tree using       double-wire technique. The Hydratome sphincterotome was passed over the       guidewire and the bile duct was then deeply cannulated. Contrast was       injected. I personally interpreted the bile duct images. The flow of       contrast through the ducts was adequate. Image quality was adequate.       Contrast extended to the main bile duct. Opacification of the entire       biliary tree except for the cystic duct and gallbladder was successful.       The main bile duct was moderately dilated. The largest diameter was 16       mm. The lower third of the main bile duct contained a single narrowing       of approximately 10 mm in length. A 4 mm biliary sphincterotomy was made       with a monofilament Hydratome sphincterotome using ERBE electrocautery.       There was no post-sphincterotomy bleeding. To discover objects, the       biliary tree was swept with a Hydratome sphincterotome. Small amount of       sludge was swept from the duct. Cells for cytology were obtained by       brushing in the lower third of the main bile duct narrowing.      Further attempts at ventral pancreatic duct distal cannulation were not       successful beyond the  area noted above. Decision made to place one 4 Fr       by 3 cm temporary plastic pancreatic stent with a single external        pigtail was placed into the ventral pancreatic duct. Clear fluid flowed       through the stent. The stent was in good position. Hopefully this will       decrease risk of post-interventional pancreatitis.      The duodenoscope was withdrawn from the patient. Impression:               - The major papilla appeared normal.                           - The entire main bile duct was moderately dilated.                           - A single biliary narrowing was found in the lower                            third of the main bile duct. The stricture was                            indeterminate. This was brushed.                           - Difficult for deep wire cannulation/selection of                            the pancreatic duct due to what seems to be a                            ventral head pancreatic duct stricture.                           - A pancreatic sphincterotomy was performed.                           - A biliary sphincterotomy was performed.                           - The biliary tree was swept and small amount of                            sludge was found.                           - One temporary plastic pancreatic stent was placed                            into the ventral pancreatic duct. Moderate Sedation:      Not Applicable - Patient had care per Anesthesia. Recommendation:           - The patient will be observed post-procedure,  until all discharge criteria are met.                           - Return patient to hospital ward for ongoing care.                           - Patient has a contact number available for                            emergencies. The signs and symptoms of potential                            delayed complications were discussed with the                            patient. Return to normal activities tomorrow.                            Written discharge instructions were provided to the                             patient.                           - Watch for pancreatitis, bleeding, perforation,                            and cholangitis.                           - Observe patient's clinical course.                           - Check liver enzymes (AST, ALT, alkaline                            phosphatase, bilirubin) in the morning.                           - Patient will need a KUB 2-view in 10-14 days to                            ensure pancreatic stent has migrated successfully.                            If still present at that time will need to be                            scheduled for EGD with stent pull.                           - If patient does well, recommend repeat imaging                            with pancreas protocol CT  abdomen in 2 to 4 weeks                            to evaluate if patient redevelops/reaccumulates any                            pseudocystic appearing area. If so, then referral                            to [facility] in an                            effort of trying to see if further attempt at PD                            stenting may be possible.                           - The findings and recommendations were discussed                            with the patient.                           - The findings and recommendations were discussed                            with the patient's family.                           - The findings and recommendations were discussed                            with the referring physician. Procedure Code(s):        --- Professional ---                           [phone], Endoscopic retrograde                            cholangiopancreatography (ERCP); with placement of                            endoscopic stent into biliary or pancreatic duct,                            including pre- and post-dilation and guide wire                            passage, when performed, including sphincterotomy,                             when performed, each stent                           [id number], Endoscopic retrograde  cholangiopancreatography (ERCP); with removal of                            calculi/debris from biliary/pancreatic duct(s) Diagnosis Code(s):        --- Professional ---                           K83.1, Obstruction of bile duct                           K86.89, Other specified diseases of pancreas                           K86.3, Pseudocyst of pancreas                           K83.8, Other specified diseases of biliary tract CPT copyright 2019 [employer]. All rights reserved. The codes documented in this report are preliminary and upon coder review may  be revised to meet current compliance requirements. Justice Britain, MD 01/27/2022 12:52:43 PM Number of Addenda: 0

## 2022-01-27 NOTE — Plan of Care (Signed)
  Problem: Clinical Measurements: Goal: Will remain free from infection Outcome: Progressing   Problem: Pain Managment: Goal: General experience of comfort will improve Outcome: Progressing   Problem: Skin Integrity: Goal: Risk for impaired skin integrity will decrease Outcome: Progressing   Problem: Clinical Measurements: Goal: Will remain free from infection Outcome: Progressing   Problem: Pain Managment: Goal: General experience of comfort will improve Outcome: Progressing   Problem: Skin Integrity: Goal: Risk for impaired skin integrity will decrease Outcome: Progressing

## 2022-01-27 NOTE — Anesthesia Postprocedure Evaluation (Signed)
Anesthesia Post Note  Patient: YESMIN MUTCH  Procedure(s) Performed: ENDOSCOPIC RETROGRADE CHOLANGIOPANCREATOGRAPHY (ERCP) SPHINCTEROTOMY PANCREATIC STENT PLACEMENT BILIARY BRUSHING     Patient location during evaluation: PACU Anesthesia Type: General Level of consciousness: awake and alert Pain management: pain level controlled Vital Signs Assessment: post-procedure vital signs reviewed and stable Respiratory status: spontaneous breathing, nonlabored ventilation and respiratory function stable Cardiovascular status: stable and blood pressure returned to baseline Anesthetic complications: no   No notable events documented.  Last Vitals:  Vitals:   01/27/22 1300 01/27/22 1310  BP: (!) 148/99 (!) 148/124  Pulse: 85 78  Resp: (!) 25 17  Temp:    SpO2: 93% 97%    Last Pain:  Vitals:   01/27/22 1310  TempSrc:   PainSc: 0-No pain                 Audry Pili

## 2022-01-27 NOTE — Anesthesia Preprocedure Evaluation (Addendum)
Anesthesia Evaluation  Patient identified by MRN, date of birth, ID band Patient awake    Reviewed: Allergy & Precautions, NPO status , Patient's Chart, lab work & pertinent test results  History of Anesthesia Complications Negative for: history of anesthetic complications  Airway Mallampati: II  TM Distance: >3 FB Neck ROM: Full    Dental  (+) Dental Advisory Given, Teeth Intact   Pulmonary neg pulmonary ROS,    Pulmonary exam normal        Cardiovascular hypertension (no longer on meds), Normal cardiovascular exam     Neuro/Psych PSYCHIATRIC DISORDERS Anxiety  Neuromuscular disease    GI/Hepatic negative GI ROS, (+) Cirrhosis     substance abuse (sober x a few years)  alcohol use,  Possible pancreatic duct leak and recurrent pancreatitis    Endo/Other  diabetes, Insulin Dependent  Renal/GU negative Renal ROS     Musculoskeletal  (+) Arthritis ,   Abdominal   Peds  Hematology  (+) Blood dyscrasia, anemia ,   Anesthesia Other Findings   Reproductive/Obstetrics                            Anesthesia Physical Anesthesia Plan  ASA: 3  Anesthesia Plan: General   Post-op Pain Management: Minimal or no pain anticipated   Induction: Intravenous  PONV Risk Score and Plan: 3 and Treatment may vary due to age or medical condition, Ondansetron, Dexamethasone and Midazolam  Airway Management Planned: Oral ETT  Additional Equipment: None  Intra-op Plan:   Post-operative Plan: Extubation in OR  Informed Consent: I have reviewed the patients History and Physical, chart, labs and discussed the procedure including the risks, benefits and alternatives for the proposed anesthesia with the patient or authorized representative who has indicated his/her understanding and acceptance.     Dental advisory given  Plan Discussed with: CRNA and Anesthesiologist  Anesthesia Plan Comments:         Anesthesia Quick Evaluation

## 2022-01-27 NOTE — Interval H&P Note (Signed)
History and Physical Interval Note:  01/27/2022 8:12 AM  Mallory Stewart  has presented today for surgery, with the diagnosis of Possible pancreatic duct leak and recurrent pancreatitis.  The various methods of treatment have been discussed with the patient and family. After consideration of risks, benefits and other options for treatment, the patient has consented to  Procedure(s): ENDOSCOPIC RETROGRADE CHOLANGIOPANCREATOGRAPHY (ERCP) (N/A) as a surgical intervention.  The patient's history has been reviewed, patient examined, no change in status, stable for surgery.  I have reviewed the patient's chart and labs.  Questions were answered to the patient's satisfaction.     The risks of an ERCP were discussed at length, including but not limited to the risk of perforation, bleeding, abdominal pain, post-ERCP pancreatitis (while usually mild can be severe and even life threatening).    [employer]

## 2022-01-27 NOTE — Transfer of Care (Signed)
Immediate Anesthesia Transfer of Care Note  Patient: NANCYJO GIVHAN  Procedure(s) Performed: ENDOSCOPIC RETROGRADE CHOLANGIOPANCREATOGRAPHY (ERCP) SPHINCTEROTOMY PANCREATIC STENT PLACEMENT BILIARY BRUSHING  Patient Location: PACU  Anesthesia Type:General  Level of Consciousness: awake and patient cooperative  Airway & Oxygen Therapy: Patient Spontanous Breathing and Patient connected to face mask  Post-op Assessment: Report given to RN and Post -op Vital signs reviewed and stable  Post vital signs: Reviewed and stable  Last Vitals:  Vitals Value Taken Time  BP 131/89 01/27/22 1236  Temp    Pulse 94 01/27/22 1239  Resp 12 01/27/22 1239  SpO2 100 % 01/27/22 1239  Vitals shown include unvalidated device data.  Last Pain:  Vitals:   01/27/22 1104  TempSrc: Temporal  PainSc: 7       Patients Stated Pain Goal: 2 (77/93/90 3009)  Complications: No notable events documented.

## 2022-01-27 NOTE — Progress Notes (Signed)
Pharmacy Antibiotic Note  Mallory Stewart is a 61 y.o. female admitted on 01/22/2022 with Sepsis secondary to cellulitis and draining pseudocyst.  Pharmacy has been consulted for Vancomycin and Unasyn dosing.  Day 6 Unasyn WBC improved SCr stable Afebilr  Plan: Continue Unasyn 3g IV q6h per current renal function PO vanc for Cdiff ppx secondary to recent C.diff infection Follow up renal function, culture results, and clinical course.   Height: 5\' 9"  (175.3 cm) Weight: 70.4 kg (155 lb 3.3 oz) IBW/kg (Calculated) : 66.2  Temp (24hrs), Avg:97.9 F (36.6 C), Min:97.8 F (36.6 C), Max:98 F (36.7 C)  Recent Labs  Lab 01/22/22 1217 01/22/22 1600 01/23/22 0508 01/24/22 0452 01/25/22 0509 01/26/22 0451 01/27/22 0523  WBC 15.7*  --  10.1 9.6 12.0* 4.5 7.8  CREATININE 0.97 0.91 1.04* 1.08* 1.13* 1.15* 1.05*  LATICACIDVEN 2.0* 2.0*  --   --   --   --   --      Estimated Creatinine Clearance: 60.3 mL/min (A) (by C-G formula based on SCr of 1.05 mg/dL (H)).    Allergies  Allergen Reactions   Ceftriaxone Hives    Immediate bright red itchy rash on bilateral legs after starting infusion.     Gluten Meal Other (See Comments)    Stomach distress   Zocor [Simvastatin] Other (See Comments)    Elevated lfts?    Antimicrobials this admission: 9/18 Ceftriaxone x1  9/18 Vancomycin >> 9/22 9/18 Unasyn >>   Dose adjustments this admission: 9/20 Empiric vanc dose adjustment for SCr increase.   Microbiology results: 9/18 BCx: ngtd 9/19 Abscess: kleb pneumo 9/21 back abscess: kleb pneumo  Thank you for allowing pharmacy to be a part of this patient's care.  Adrian Saran, PharmD, BCPS Secure Chat if ?s 01/27/2022 9:44 AM

## 2022-01-28 ENCOUNTER — Encounter (HOSPITAL_COMMUNITY): Payer: Self-pay | Admitting: Gastroenterology

## 2022-01-28 ENCOUNTER — Inpatient Hospital Stay (HOSPITAL_COMMUNITY): Payer: 59

## 2022-01-28 DIAGNOSIS — K8689 Other specified diseases of pancreas: Secondary | ICD-10-CM

## 2022-01-28 DIAGNOSIS — K831 Obstruction of bile duct: Secondary | ICD-10-CM

## 2022-01-28 DIAGNOSIS — L02211 Cutaneous abscess of abdominal wall: Secondary | ICD-10-CM | POA: Diagnosis not present

## 2022-01-28 DIAGNOSIS — I340 Nonrheumatic mitral (valve) insufficiency: Secondary | ICD-10-CM

## 2022-01-28 DIAGNOSIS — R7989 Other specified abnormal findings of blood chemistry: Secondary | ICD-10-CM

## 2022-01-28 DIAGNOSIS — K7460 Unspecified cirrhosis of liver: Secondary | ICD-10-CM

## 2022-01-28 DIAGNOSIS — I361 Nonrheumatic tricuspid (valve) insufficiency: Secondary | ICD-10-CM

## 2022-01-28 DIAGNOSIS — K863 Pseudocyst of pancreas: Secondary | ICD-10-CM | POA: Diagnosis not present

## 2022-01-28 DIAGNOSIS — K860 Alcohol-induced chronic pancreatitis: Secondary | ICD-10-CM | POA: Diagnosis not present

## 2022-01-28 DIAGNOSIS — K7290 Hepatic failure, unspecified without coma: Secondary | ICD-10-CM

## 2022-01-28 LAB — COMPREHENSIVE METABOLIC PANEL
ALT: 85 U/L — ABNORMAL HIGH (ref 0–44)
AST: 200 U/L — ABNORMAL HIGH (ref 15–41)
Albumin: 2.2 g/dL — ABNORMAL LOW (ref 3.5–5.0)
Alkaline Phosphatase: 395 U/L — ABNORMAL HIGH (ref 38–126)
Anion gap: 6 (ref 5–15)
BUN: 18 mg/dL (ref 6–20)
CO2: 26 mmol/L (ref 22–32)
Calcium: 8.2 mg/dL — ABNORMAL LOW (ref 8.9–10.3)
Chloride: 101 mmol/L (ref 98–111)
Creatinine, Ser: 1.09 mg/dL — ABNORMAL HIGH (ref 0.44–1.00)
GFR, Estimated: 59 mL/min — ABNORMAL LOW (ref >60)
Glucose, Bld: 226 mg/dL — ABNORMAL HIGH (ref 70–99)
Potassium: 4.4 mmol/L (ref 3.5–5.1)
Sodium: 133 mmol/L — ABNORMAL LOW (ref 135–145)
Total Bilirubin: 0.6 mg/dL (ref 0.3–1.2)
Total Protein: 5.3 g/dL — ABNORMAL LOW (ref 6.5–8.1)

## 2022-01-28 LAB — ECHOCARDIOGRAM COMPLETE
AR max vel: 1.96 cm2
AV Area VTI: 2.05 cm2
AV Area mean vel: 2 cm2
AV Mean grad: 5 mmHg
AV Peak grad: 9.6 mmHg
Ao pk vel: 1.55 m/s
Area-P 1/2: 3.31 cm2
Calc EF: 59.5 %
MV M vel: 4.12 m/s
MV Peak grad: 67.9 mmHg
S' Lateral: 2.9 cm
Single Plane A2C EF: 57 %
Single Plane A4C EF: 61.5 %

## 2022-01-28 LAB — CBC WITH DIFFERENTIAL/PLATELET
Abs Immature Granulocytes: 0.12 10*3/uL — ABNORMAL HIGH (ref 0.00–0.07)
Basophils Absolute: 0 10*3/uL (ref 0.0–0.1)
Basophils Relative: 0 %
Eosinophils Absolute: 0 10*3/uL (ref 0.0–0.5)
Eosinophils Relative: 0 %
HCT: 28.5 % — ABNORMAL LOW (ref 36.0–46.0)
Hemoglobin: 9.3 g/dL — ABNORMAL LOW (ref 12.0–15.0)
Immature Granulocytes: 2 %
Lymphocytes Relative: 12 %
Lymphs Abs: 0.8 10*3/uL (ref 0.7–4.0)
MCH: 28.6 pg (ref 26.0–34.0)
MCHC: 32.6 g/dL (ref 30.0–36.0)
MCV: 87.7 fL (ref 80.0–100.0)
Monocytes Absolute: 0.4 10*3/uL (ref 0.1–1.0)
Monocytes Relative: 6 %
Neutro Abs: 5.2 10*3/uL (ref 1.7–7.7)
Neutrophils Relative %: 80 %
Platelets: 211 10*3/uL (ref 150–400)
RBC: 3.25 MIL/uL — ABNORMAL LOW (ref 3.87–5.11)
RDW: 13.5 % (ref 11.5–15.5)
WBC: 6.5 10*3/uL (ref 4.0–10.5)
nRBC: 0 % (ref 0.0–0.2)

## 2022-01-28 LAB — GLUCOSE, CAPILLARY
Glucose-Capillary: 173 mg/dL — ABNORMAL HIGH (ref 70–99)
Glucose-Capillary: 185 mg/dL — ABNORMAL HIGH (ref 70–99)
Glucose-Capillary: 231 mg/dL — ABNORMAL HIGH (ref 70–99)
Glucose-Capillary: 306 mg/dL — ABNORMAL HIGH (ref 70–99)

## 2022-01-28 LAB — AEROBIC/ANAEROBIC CULTURE W GRAM STAIN (SURGICAL/DEEP WOUND)
Culture: NO GROWTH
Gram Stain: NONE SEEN

## 2022-01-28 LAB — LIPASE, BLOOD: Lipase: 37 U/L (ref 11–51)

## 2022-01-28 MED ORDER — SPIRONOLACTONE 25 MG PO TABS
25.0000 mg | ORAL_TABLET | Freq: Once | ORAL | Status: AC
  Administered 2022-01-28: 25 mg via ORAL
  Filled 2022-01-28: qty 1

## 2022-01-28 MED ORDER — SPIRONOLACTONE 25 MG PO TABS
50.0000 mg | ORAL_TABLET | Freq: Every day | ORAL | Status: DC
  Administered 2022-01-29: 50 mg via ORAL
  Filled 2022-01-28: qty 2

## 2022-01-28 MED ORDER — GLUCAGON HCL RDNA (DIAGNOSTIC) 1 MG IJ SOLR
INTRAMUSCULAR | Status: AC
  Filled 2022-01-28: qty 1

## 2022-01-28 NOTE — Progress Notes (Signed)
Gastroenterology Inpatient Follow-up Note   PATIENT IDENTIFICATION  Mallory Stewart is a 61 y.o. female with a pmh significant for diabetes, hypertension, cirrhosis (complicated by portal high pretension associated with ascites and EV's and GV's), history of chronic alcoholic pancreatitis, with recent peripancreatic/retroperitoneal infected pseudocyst/abscess status post aspiration now admitted to the hospital with cellulitis around the area of previous aspiration site.  Hospital Day: 7  SUBJECTIVE  The patient's chart was reviewed. The patient's labs were reviewed.  The patient has elevations in her transferases and slight elevation in her alkaline phosphatase but normal bilirubin.  This is likely due to biliary interventions yesterday.  We did not leave a biliary stent since she had normal LFTs yesterday.  Her electrolytes are overall stable otherwise.  No progressive anemia.  MELD 3.0 is 16 (mostly result of her albumin level). Today, the patient is feeling well.  She has no symptoms that are concerning for pancreatitis. Her lower abdomen is beginning to feel little bit more bloated but she wants to see where things go into tomorrow   OBJECTIVE  Scheduled Inpatient Medications:   aspirin EC  81 mg Oral Daily   furosemide  20 mg Oral QODAY   insulin aspart  0-5 Units Subcutaneous QHS   insulin aspart  0-9 Units Subcutaneous TID WC   insulin glargine-yfgn  15 Units Subcutaneous Daily   pantoprazole  40 mg Oral BID   spironolactone  25 mg Oral Once   [START ON 01/29/2022] spironolactone  50 mg Oral Daily   vancomycin  125 mg Oral BID   Continuous Inpatient Infusions:   ampicillin-sulbactam (UNASYN) IV 3 g (01/28/22 1251)   PRN Inpatient Medications: diphenhydrAMINE, methocarbamol, morphine injection, prochlorperazine, traMADol   Physical Examination  Temp:  [97.5 F (36.4 C)-98.4 F (36.9 C)] 98.4 F (36.9 C) (09/24 0836) Pulse Rate:  [60-80] 60 (09/24 0535) Resp:  [16-19]  19 (09/24 0836) BP: (108-172)/(70-96) 108/84 (09/24 0836) SpO2:  [94 %-100 %] 97 % (09/24 0836) Temp (24hrs), Avg:97.8 F (36.6 C), Min:97.5 F (36.4 C), Max:98.4 F (36.9 C)  Weight: 70.4 kg GEN: NAD, appears stated age, doesn't appear chronically ill PSYCH: Cooperative, without pressured speech EYE: Conjunctivae pink, sclerae anicteric ENT: MMM CV: Nontachycardic RESP: No wheezing GI: NABS, soft, NT/ND, without rebound or guarding MSK/EXT: Trace bilateral pedal edema SKIN: No jaundice NEURO:  Alert & Oriented x 3, no focal deficits   Review of Data   Laboratory Studies   Recent Labs  Lab 01/24/22 0452 01/25/22 0509 01/26/22 0451 01/28/22 0516  NA 131* 134*   < > 133*  K 4.5 3.7   < > 4.4  CL 100 104   < > 101  CO2 24 21*   < > 26  BUN 17 16   < > 18  CREATININE 1.08* 1.13*   < > 1.09*  GLUCOSE 233* 168*   < > 226*  CALCIUM 8.1* 8.5*   < > 8.2*  MG 1.5* 1.8  --   --   PHOS 3.7  --   --   --    < > = values in this interval not displayed.   Recent Labs  Lab 01/28/22 0516  AST 200*  ALT 85*  ALKPHOS 395*    Recent Labs  Lab 01/26/22 0451 01/27/22 0523 01/28/22 0516  WBC 4.5 7.8 6.5  HGB 9.7* 9.6* 9.3*  HCT 29.5* 29.7* 28.5*  PLT 222 247 211   Recent Labs  Lab 01/22/22 1217 01/23/22 [id number]  01/25/22 0509 01/27/22 0523  APTT 32  --   --   --   INR 1.5* 1.8* 1.2 1.3*   MELD 3.0: 16 at 01/28/2022  5:16 AM MELD-Na: 10 at 01/28/2022  5:16 AM Calculated from: Serum Creatinine: 1.09 mg/dL at 01/28/2022  5:16 AM Serum Sodium: 133 mmol/L at 01/28/2022  5:16 AM Total Bilirubin: 0.6 mg/dL (Using min of 1 mg/dL) at 01/28/2022  5:16 AM Serum Albumin: 2.2 g/dL at 01/28/2022  5:16 AM INR(ratio): 1.3 at 01/27/2022  5:23 AM Age at listing (hypothetical): 65 years Sex: Female at 01/28/2022  5:16 AM   Imaging Studies  DG ERCP  Result Date: 01/27/2022 CLINICAL DATA:  Biliary obstruction EXAM: ERCP TECHNIQUE: Multiple spot images obtained with the fluoroscopic device  and submitted for interpretation post-procedure. FLUOROSCOPY: Radiation Exposure Index (as provided by the fluoroscopic device): 93.79 mGy Kerma COMPARISON:  MRCP 01/25/2022 FINDINGS: A total of 11 intraoperative spot images are submitted for review. The images demonstrate a flexible duodenal scope in the descending duodenum followed by wire cannulation of the pancreatic duct and common bile duct. On the final images, a plastic stent has been placed in the pancreatic duct. IMPRESSION: ERCP with placement of a plastic pancreatic stent. These images were submitted for radiologic interpretation only. Please see the procedural report for the amount of contrast and the fluoroscopy time utilized. Electronically Signed   By: Jacqulynn Cadet M.D.   On: 01/27/2022 14:51    GI Procedures and Studies  ERCP - The major papilla appeared normal. - The entire main bile duct was moderately dilated. - A single biliary narrowing was found in the lower third of the main bile duct. The stricture was indeterminate. This was brushed. - Difficult for deep wire cannulation/selection of the pancreatic duct due to what seems to be a ventral head pancreatic duct stricture. - A pancreatic sphincterotomy was performed. - A biliary sphincterotomy was performed. - The biliary tree was swept and small amount of sludge was found. - One temporary plastic pancreatic stent was placed into the ventral pancreatic duct.   ASSESSMENT  Ms. Mallory Stewart is a 61 y.o. female with a pmh significant for diabetes, hypertension, cirrhosis (complicated by portal high pretension associated with ascites and EV's and GV's), history of chronic alcoholic pancreatitis, with recent peripancreatic/retroperitoneal infected pseudocyst/abscess status post aspiration now admitted to the hospital with cellulitis around the area of previous aspiration site.   The patient is hemodynamically stable.  Clinically she seems to be doing okay as well with no evidence of  overt post ERCP complications.  I suspect her LFTs are increased as a result of the biliary manipulation yesterday but her bilirubin is normal and so I suspect that over the course of the next few days those liver test should continue to improve.  We will see what her cytology results show.  Unfortunately I was not able to access the more proximal pancreatic duct to truly evaluate whether a diffuse tail disruption was present.  The temporary pancreas stent will fall out hopefully in the next couple of weeks.  The plan of action from a pancreas standpoint will be repeat imaging in 3 weeks with a pancreas protocol CT abdomen/pelvis to see if there is any reaccumulation of any fluid in the abdomen.  If there is reaccumulation that is concerning for pseudocyst recurrence, then she will need a referral to [facility] to see if they would be able to try and repeat an ERCP to access the pancreatic proximal duct.  If  there is no recurrence then that is great and then the focus can shift to just her underlying decompensated cirrhosis.  We will allow our medicine service and infectious disease service to consider an antibiotic that can be given orally and would likely treat for at least 10 days to 14 days total since aspiration unless ID feels any other regimen or timing should be considered.  I am ordering the patient a TTE which can be done today or tomorrow to get updated imaging of her heart because I think she may end up needing a TIPS procedure.  I am increasing her diuretics today with an extra dose of spironolactone and we will see how her electrolytes look tomorrow.  Time will tell.  Our team will pass by and see tomorrow.   PLAN/RECOMMENDATIONS  Continue antibiotics Appreciate medicine and ID to consider transition to oral regimen Repeat LFTs tomorrow Spironolactone 25 mg x 1 dose today extra Lasix 20 mg daily to continue Spironolactone 50 mg daily to continue (starting tomorrow) Tentative plan for pancreas  protocol CT abdomen/pelvis in 3 weeks to reevaluate pancreatic fluid collections/infected pseudocyst Follow-up with [facility] hepatology service for decompensated cirrhosis otherwise TTE ordered to evaluate right heart in case TIPS procedure required in near future   Please page/call with questions or concerns.   Justice Britain, MD [facility] Office # [phone]    LOS: 6 days  Irving Copas  01/28/2022, 1:32 PM

## 2022-01-28 NOTE — Plan of Care (Signed)
  Problem: Education: Goal: Knowledge of General Education information will improve Description: Including pain rating scale, medication(s)/side effects and non-pharmacologic comfort measures Outcome: Progressing   Problem: Clinical Measurements: Goal: Ability to maintain clinical measurements within normal limits will improve Outcome: Progressing Goal: Will remain free from infection Outcome: Progressing   Problem: Activity: Goal: Risk for activity intolerance will decrease Outcome: Progressing   Problem: Health Behavior/Discharge Planning: Goal: Ability to manage health-related needs will improve Outcome: Progressing

## 2022-01-28 NOTE — Progress Notes (Signed)
PROGRESS NOTE  Mallory Stewart  [id number] DOB: [birth year] DOA: 01/22/2022 PCP: [name], MD   Brief Narrative:  Patient is a 61 year old female with history of chronic pancreatitis with pseudocyst with extension into the retroperitoneum, type 2 diabetes, cirrhosis, hypertension who presented with abdominal distention, erythema of the left flank, chills, draining from left flank.  No history of fever.  CT imaging showed features of cellulitis of the left flank, large amount of ascites , a large peripherally enhancing complex fluid collection  within the left posterior pararenal space, with posterior extension through the posterior left abdominal wall.  General surgery, IR, GI, ID consulted and following.  Currently on broad-spectrum antibiotics.  General surgery did  incision and drainage of abscess on the back on 9/21.  GI  did ERCP on 9/23  Assessment & Plan:  Principal Problem:   Sepsis ([place]) Active Problems:   Ascites   HTN (hypertension)   Pancreatic abscess   Abnormal CT of the abdomen   Chronic hyponatremia   Type 2 diabetes mellitus with hyperglycemia, with long-term current use of insulin (HCC)   Pancreatic pseudocyst   Cellulitis   Allergic reaction   Acute pancreatic fluid collection   Cellulitis of back   Infected pseudocyst of pancreas   Cellulitis and abscess of trunk   Infected pancreatic pseudocyst   Leakage from tail of pancreas   Medication monitoring encounter   Abdominal wall abscess   Atrophic pancreas   Alcohol-induced chronic pancreatitis ([place])   Pancreatic duct leak  Sepsis secondary to cellulitis, draining pseudocyst: Presented with tachycardia, tachypnea, elevated lactate.  Found to have cellulitis of the left flank, large amount of ascites , a large peripherally enhancing complex fluid collection  within the left posterior pararenal space, with posterior extension through the posterior left abdominal wall.  Started on broad-spectrum  antibiotics.  IR consulted and she underwent ultrasound-guided aspiration of the left posterior abdominal wall collection.  Fluid was bloody, purulent.  General surgery did I&D on 9/21. aerobic/anaerobic cultures showed Klebsiella pneumoniae. Currently on Unasyn.  Antibiotics will be changed to oral as per ID   Alcoholic cirrhosis/pancreatic pseudocyst: GI following.  Has history of portal hypertension.  Has history of chronic alcoholic pancreatitis with recent peripancreatic/retroperitoneal infected pseudocyst/abscess.  MRCP showed near-complete resolution of left abdominal pancreatic pseudocyst,hepatic cirrhosis and portal venous hypertension resulting in splenomegaly and gastroesophageal varices,diffuse biliary ductal dilatation with stricture within the pancreatic head. No radiographic signs of pancreatic neoplasm. On 9/23, ERCP was done  to  rule out pancreatic duct leak .  ERCP was not that satisfactory.  Found to have possible distal pancreatic duct stricture.  Status post biliary and pancreatic sphincterectomy, pancreatic duct stent placement. Liver enzymes went up today.  She denies abdomen pain and abdomen is benign on examination.  Lipase normal.  We will check CMP tomorrow  Type 2 diabetes: Continue current insulin regimen.  Monitor blood sugars  Hypertension: On Lasix, spironolactone at home.  resumed  Chronic hyponatremia: Likely from cirrhosis.  Sodium remained stable around 130  Hypomagnesemia: Supplemented with magnesium and corrected         DVT prophylaxis:SCDs Start: 01/22/22 1701     Code Status: Full Code  Family Communication: None at bedside  Patient status:Inpatient  Patient is from :Home  Anticipated discharge NE:[id number]  Estimated DC date:tomorrow   Consultants: GI,IR  Procedures:I and D,ERCP  Antimicrobials:  Anti-infectives (From admission, onward)    Start     Dose/Rate Route Frequency Ordered Stop  01/24/22 1400  vancomycin (VANCOREADY) IVPB  1250 mg/250 mL  Status:  Discontinued        1,250 mg 166.7 mL/hr over 90 Minutes Intravenous Every 24 hours 01/24/22 0819 01/26/22 0858   01/24/22 1400  vancomycin (VANCOCIN) capsule 125 mg        125 mg Oral 2 times daily 01/24/22 1311     01/23/22 1400  vancomycin (VANCOREADY) IVPB 1500 mg/300 mL  Status:  Discontinued        1,500 mg 150 mL/hr over 120 Minutes Intravenous Every 24 hours 01/22/22 1604 01/24/22 0819   01/22/22 1800  Ampicillin-Sulbactam (UNASYN) 3 g in sodium chloride 0.9 % 100 mL IVPB        3 g 200 mL/hr over 30 Minutes Intravenous Every 6 hours 01/22/22 1714     01/22/22 1345  cefTRIAXone (ROCEPHIN) 2 g in sodium chloride 0.9 % 100 mL IVPB        2 g 200 mL/hr over 30 Minutes Intravenous  Once 01/22/22 1342 01/22/22 1430   01/22/22 1345  vancomycin (VANCOREADY) IVPB 1250 mg/250 mL        1,250 mg 166.7 mL/hr over 90 Minutes Intravenous  Once 01/22/22 1342 01/23/22 0435       Subjective: Patient seen and examined the bedside today.  Hemodynamically stable.  Comfortable without any abdominal pain.  No new complaints.  Back wound looks much better  Objective: Vitals:   01/27/22 1338 01/27/22 2155 01/28/22 0535 01/28/22 0836  BP: (!) 172/96 109/70 131/79 108/84  Pulse: 80 66 60   Resp: 16 17 19 19   Temp: (!) 97.5 F (36.4 C) 97.6 F (36.4 C) 97.6 F (36.4 C) 98.4 F (36.9 C)  TempSrc: Oral Oral Oral Oral  SpO2: 94% 98% 100% 97%  Weight:      Height:        Intake/Output Summary (Last 24 hours) at 01/28/2022 1057 Last data filed at 01/28/2022 0501 Gross per 24 hour  Intake 1626.66 ml  Output 0 ml  Net 1626.66 ml   Filed Weights   01/22/22 1725  Weight: 70.4 kg    Examination:   General exam: Overall comfortable, not in distress HEENT: PERRL Respiratory system:  no wheezes or crackles  Cardiovascular system: S1 & S2 heard, RRR.  Gastrointestinal system: Abdomen is nondistended, soft and nontender. Central nervous system: Alert and  oriented Extremities: No edema, no clubbing ,no cyanosis Skin: wound on the left back covered with dressing, drain    Data Reviewed: I have personally reviewed following labs and imaging studies  CBC: Recent Labs  Lab 01/22/22 1217 01/23/22 0508 01/24/22 0452 01/25/22 0509 01/26/22 0451 01/27/22 0523 01/28/22 0516  WBC 15.7*   < > 9.6 12.0* 4.5 7.8 6.5  NEUTROABS 13.6*  --   --   --   --  5.3 5.2  HGB 10.0*   < > 9.2* 11.3* 9.7* 9.6* 9.3*  HCT 30.7*   < > 28.2* 36.0 29.5* 29.7* 28.5*  MCV 87.7   < > 87.9 91.4 87.3 88.1 87.7  PLT 261   < > 231 353 222 247 211   < > = values in this interval not displayed.   Basic Metabolic Panel: Recent Labs  Lab 01/24/22 0452 01/25/22 0509 01/26/22 0451 01/27/22 0523 01/28/22 0516  NA 131* 134* 135 136 133*  K 4.5 3.7 5.1 3.9 4.4  CL 100 104 105 106 101  CO2 24 21* 27 26 26   GLUCOSE 233* 168* 263* 167*  226*  BUN 17 16 17 16 18   CREATININE 1.08* 1.13* 1.15* 1.05* 1.09*  CALCIUM 8.1* 8.5* 8.6* 8.4* 8.2*  MG 1.5* 1.8  --   --   --   PHOS 3.7  --   --   --   --      Recent Results (from the past 240 hour(s))  Blood Culture (routine x 2)     Status: None   Collection Time: 01/22/22 12:17 PM   Specimen: BLOOD  Result Value Ref Range Status   Specimen Description   Final    BLOOD RIGHT ANTECUBITAL Performed at [facility], [place] [address], [place], [place] [address]    Special Requests   Final    BOTTLES DRAWN AEROBIC AND ANAEROBIC Blood Culture adequate volume Performed at [facility] [address], [place], [place] [address]    Culture   Final    NO GROWTH 5 DAYS Performed at [facility], [place] [address] [place], [place] [address]    Report Status 01/27/2022 FINAL  Final  Blood Culture (routine x 2)     Status: None   Collection Time: 01/22/22  5:06 PM   Specimen: BLOOD  Result Value Ref Range Status   Specimen Description   Final    BLOOD SITE NOT SPECIFIED Performed at  [facility] [address] [place], [place] [address]    Special Requests   Final    BOTTLES DRAWN AEROBIC ONLY Blood Culture adequate volume Performed at [facility] [address] [place], [place] [address]    Culture   Final    NO GROWTH 5 DAYS Performed at [facility], [place] [address], [place], [place] [address]    Report Status 01/27/2022 FINAL  Final  Aerobic/Anaerobic Culture w Gram Stain (surgical/deep wound)     Status: None (Preliminary result)   Collection Time: 01/23/22 10:06 AM   Specimen: PATH Cytology Peritoneal fluid  Result Value Ref Range Status   Specimen Description   Final    PERITONEAL Performed at [facility] [address] [place], [place] [address]    Special Requests   Final    NONE Performed at [facility], [place] [address], [place], [place] [address]    Gram Stain NO WBC SEEN NO ORGANISMS SEEN   Final   Culture   Final    NO GROWTH 4 DAYS NO ANAEROBES ISOLATED; CULTURE IN PROGRESS FOR 5 DAYS Performed at [facility] [address], [place], [place] [address]    Report Status PENDING  Incomplete  Aerobic/Anaerobic Culture w Gram Stain (surgical/deep wound)     Status: None (Preliminary result)   Collection Time: 01/23/22  4:59 PM   Specimen: Abscess  Result Value Ref Range Status   Specimen Description   Final    ABSCESS Performed at [facility] [address], [place], [place] [address]    Special Requests   Final    NONE Performed at [facility], [place] [address], [place], [place] [address]    Gram Stain   Final    ABUNDANT WBC PRESENT, PREDOMINANTLY PMN NO ORGANISMS SEEN Performed at [facility], [place] [address] [place],  [address]    Culture   Final    MODERATE KLEBSIELLA PNEUMONIAE NO ANAEROBES ISOLATED; CULTURE IN PROGRESS FOR 5 DAYS    Report Status PENDING  Incomplete  Organism  ID, Bacteria KLEBSIELLA PNEUMONIAE  Final      Susceptibility   Klebsiella pneumoniae - MIC*    AMPICILLIN >=32 RESISTANT Resistant     CEFAZOLIN <=4 SENSITIVE Sensitive     CEFEPIME <=0.12 SENSITIVE Sensitive     CEFTAZIDIME <=1 SENSITIVE Sensitive     CEFTRIAXONE <=0.25 SENSITIVE Sensitive     CIPROFLOXACIN <=0.25 SENSITIVE Sensitive     GENTAMICIN <=1 SENSITIVE Sensitive     IMIPENEM <=0.25 SENSITIVE Sensitive     TRIMETH/SULFA <=20 SENSITIVE Sensitive     AMPICILLIN/SULBACTAM 4 SENSITIVE Sensitive     PIP/TAZO <=4 SENSITIVE Sensitive     * MODERATE KLEBSIELLA PNEUMONIAE  Surgical pcr screen     Status: None   Collection Time: 01/25/22  7:46 AM   Specimen: Nasal Mucosa; Nasal Swab  Result Value Ref Range Status   MRSA, PCR NEGATIVE NEGATIVE Final   Staphylococcus aureus NEGATIVE NEGATIVE Final    Comment: (NOTE) The Xpert SA Assay (FDA approved for NASAL specimens in patients 110 years of age and older), is one component of a comprehensive surveillance program. It is not intended to diagnose infection nor to guide or monitor treatment. Performed at [facility], [place] [address], , [place] [address]   Aerobic/Anaerobic Culture w Gram Stain (surgical/deep wound)     Status: None (Preliminary result)   Collection Time: 01/25/22  3:16 PM   Specimen: PATH Other; Tissue  Result Value Ref Range Status   Specimen Description   Final    ABSCESS Performed at [facility] [address], [place], [place] [address]    Special Requests   Final    NONE BACK Performed at [facility] [address] [place], [place] [address]    Gram Stain   Final    NO WBC SEEN NO ORGANISMS SEEN Performed at [facility], [place] [address], [place], [place] [address]    Culture   Final    RARE KLEBSIELLA PNEUMONIAE NO ANAEROBES ISOLATED; CULTURE IN PROGRESS FOR 5 DAYS    Report Status PENDING  Incomplete   Organism ID, Bacteria  KLEBSIELLA PNEUMONIAE  Final      Susceptibility   Klebsiella pneumoniae - MIC*    AMPICILLIN >=32 RESISTANT Resistant     CEFAZOLIN <=4 SENSITIVE Sensitive     CEFEPIME <=0.12 SENSITIVE Sensitive     CEFTAZIDIME <=1 SENSITIVE Sensitive     CEFTRIAXONE <=0.25 SENSITIVE Sensitive     CIPROFLOXACIN <=0.25 SENSITIVE Sensitive     GENTAMICIN <=1 SENSITIVE Sensitive     IMIPENEM <=0.25 SENSITIVE Sensitive     TRIMETH/SULFA <=20 SENSITIVE Sensitive     AMPICILLIN/SULBACTAM 4 SENSITIVE Sensitive     PIP/TAZO <=4 SENSITIVE Sensitive     * RARE KLEBSIELLA PNEUMONIAE     Radiology Studies: DG ERCP  Result Date: 01/27/2022 CLINICAL DATA:  Biliary obstruction EXAM: ERCP TECHNIQUE: Multiple spot images obtained with the fluoroscopic device and submitted for interpretation post-procedure. FLUOROSCOPY: Radiation Exposure Index (as provided by the fluoroscopic device): 93.79 mGy Kerma COMPARISON:  MRCP 01/25/2022 FINDINGS: A total of 11 intraoperative spot images are submitted for review. The images demonstrate a flexible duodenal scope in the descending duodenum followed by wire cannulation of the pancreatic duct and common bile duct. On the final images, a plastic stent has been placed in the pancreatic duct. IMPRESSION: ERCP with placement of a plastic pancreatic stent. These images were submitted for radiologic interpretation only. Please see the procedural  report for the amount of contrast and the fluoroscopy time utilized. Electronically Signed   By: Jacqulynn Cadet M.D.   On: 01/27/2022 14:51    Scheduled Meds:  aspirin EC  81 mg Oral Daily   furosemide  20 mg Oral QODAY   insulin aspart  0-5 Units Subcutaneous QHS   insulin aspart  0-9 Units Subcutaneous TID WC   insulin glargine-yfgn  15 Units Subcutaneous Daily   pantoprazole  40 mg Oral BID   spironolactone  25 mg Oral Daily   vancomycin  125 mg Oral BID   Continuous Infusions:  ampicillin-sulbactam (UNASYN) IV 3 g (01/28/22 0501)      LOS: 6 days   Shelly Coss, MD [facility] P9/24/2023, 10:57 AM

## 2022-01-28 NOTE — Progress Notes (Signed)
1 Day Post-Op   Subjective/Chief Complaint: Ercp yesterday, tol diet, no ab pain   Objective: Vital signs in last 24 hours: Temp:  [97.5 F (36.4 C)-98.4 F (36.9 C)] 98.4 F (36.9 C) (09/24 0836) Pulse Rate:  [60-94] 60 (09/24 0535) Resp:  [12-25] 19 (09/24 0836) BP: (108-175)/(70-124) 108/84 (09/24 0836) SpO2:  [93 %-100 %] 97 % (09/24 0836) Last BM Date : 01/21/22  Intake/Output from previous day: 09/23 0701 - 09/24 0700 In: 1626.7 [P.O.:240; I.V.:1086.8; IV Piggyback:299.9] Out: 0  Intake/Output this shift: No intake/output data recorded.  Back without cellulitis, minimal drainage, no infection noted., penrose in place  Lab Results:  Recent Labs    01/27/22 0523 01/28/22 0516  WBC 7.8 6.5  HGB 9.6* 9.3*  HCT 29.7* 28.5*  PLT 247 211   BMET Recent Labs    01/27/22 0523 01/28/22 0516  NA 136 133*  K 3.9 4.4  CL 106 101  CO2 26 26  GLUCOSE 167* 226*  BUN 16 18  CREATININE 1.05* 1.09*  CALCIUM 8.4* 8.2*   PT/INR Recent Labs    01/27/22 0523  LABPROT 15.7*  INR 1.3*   ABG No results for input(s): "PHART", "HCO3" in the last 72 hours.  Invalid input(s): "PCO2", "PO2"  Studies/Results: DG ERCP  Result Date: 01/27/2022 CLINICAL DATA:  Biliary obstruction EXAM: ERCP TECHNIQUE: Multiple spot images obtained with the fluoroscopic device and submitted for interpretation post-procedure. FLUOROSCOPY: Radiation Exposure Index (as provided by the fluoroscopic device): 93.79 mGy Kerma COMPARISON:  MRCP 01/25/2022 FINDINGS: A total of 11 intraoperative spot images are submitted for review. The images demonstrate a flexible duodenal scope in the descending duodenum followed by wire cannulation of the pancreatic duct and common bile duct. On the final images, a plastic stent has been placed in the pancreatic duct. IMPRESSION: ERCP with placement of a plastic pancreatic stent. These images were submitted for radiologic interpretation only. Please see the procedural  report for the amount of contrast and the fluoroscopy time utilized. Electronically Signed   By: Jacqulynn Cadet M.D.   On: 01/27/2022 14:51    Anti-infectives: Anti-infectives (From admission, onward)    Start     Dose/Rate Route Frequency Ordered Stop   01/24/22 1400  vancomycin (VANCOREADY) IVPB 1250 mg/250 mL  Status:  Discontinued        1,250 mg 166.7 mL/hr over 90 Minutes Intravenous Every 24 hours 01/24/22 0819 01/26/22 0858   01/24/22 1400  vancomycin (VANCOCIN) capsule 125 mg        125 mg Oral 2 times daily 01/24/22 1311     01/23/22 1400  vancomycin (VANCOREADY) IVPB 1500 mg/300 mL  Status:  Discontinued        1,500 mg 150 mL/hr over 120 Minutes Intravenous Every 24 hours 01/22/22 1604 01/24/22 0819   01/22/22 1800  Ampicillin-Sulbactam (UNASYN) 3 g in sodium chloride 0.9 % 100 mL IVPB        3 g 200 mL/hr over 30 Minutes Intravenous Every 6 hours 01/22/22 1714     01/22/22 1345  cefTRIAXone (ROCEPHIN) 2 g in sodium chloride 0.9 % 100 mL IVPB        2 g 200 mL/hr over 30 Minutes Intravenous  Once 01/22/22 1342 01/22/22 1430   01/22/22 1345  vancomycin (VANCOREADY) IVPB 1250 mg/250 mL        1,250 mg 166.7 mL/hr over 90 Minutes Intravenous  Once 01/22/22 1342 01/23/22 0435       Assessment/Plan: POD 3 I/d back  abscess with penrose -change outer dressing as needed -may shower -dc when medically able.   Rolm Bookbinder 01/28/2022

## 2022-01-29 ENCOUNTER — Telehealth (HOSPITAL_COMMUNITY): Payer: Self-pay | Admitting: Pharmacy Technician

## 2022-01-29 ENCOUNTER — Other Ambulatory Visit (HOSPITAL_COMMUNITY): Payer: Self-pay

## 2022-01-29 ENCOUNTER — Other Ambulatory Visit: Payer: Self-pay | Admitting: Nurse Practitioner

## 2022-01-29 ENCOUNTER — Telehealth: Payer: Self-pay

## 2022-01-29 DIAGNOSIS — K7031 Alcoholic cirrhosis of liver with ascites: Secondary | ICD-10-CM

## 2022-01-29 DIAGNOSIS — A498 Other bacterial infections of unspecified site: Secondary | ICD-10-CM

## 2022-01-29 DIAGNOSIS — R188 Other ascites: Secondary | ICD-10-CM

## 2022-01-29 DIAGNOSIS — F1010 Alcohol abuse, uncomplicated: Secondary | ICD-10-CM

## 2022-01-29 DIAGNOSIS — K863 Pseudocyst of pancreas: Secondary | ICD-10-CM | POA: Diagnosis not present

## 2022-01-29 DIAGNOSIS — R7989 Other specified abnormal findings of blood chemistry: Secondary | ICD-10-CM | POA: Diagnosis not present

## 2022-01-29 DIAGNOSIS — K831 Obstruction of bile duct: Secondary | ICD-10-CM

## 2022-01-29 DIAGNOSIS — K860 Alcohol-induced chronic pancreatitis: Secondary | ICD-10-CM | POA: Diagnosis not present

## 2022-01-29 DIAGNOSIS — A419 Sepsis, unspecified organism: Secondary | ICD-10-CM | POA: Diagnosis not present

## 2022-01-29 DIAGNOSIS — L02211 Cutaneous abscess of abdominal wall: Secondary | ICD-10-CM | POA: Diagnosis not present

## 2022-01-29 DIAGNOSIS — T85528A Displacement of other gastrointestinal prosthetic devices, implants and grafts, initial encounter: Secondary | ICD-10-CM

## 2022-01-29 DIAGNOSIS — K8689 Other specified diseases of pancreas: Secondary | ICD-10-CM | POA: Diagnosis not present

## 2022-01-29 DIAGNOSIS — R6520 Severe sepsis without septic shock: Secondary | ICD-10-CM | POA: Diagnosis not present

## 2022-01-29 LAB — COMPREHENSIVE METABOLIC PANEL
ALT: 60 U/L — ABNORMAL HIGH (ref 0–44)
AST: 81 U/L — ABNORMAL HIGH (ref 15–41)
Albumin: 2.4 g/dL — ABNORMAL LOW (ref 3.5–5.0)
Alkaline Phosphatase: 317 U/L — ABNORMAL HIGH (ref 38–126)
Anion gap: 7 (ref 5–15)
BUN: 16 mg/dL (ref 6–20)
CO2: 25 mmol/L (ref 22–32)
Calcium: 8.3 mg/dL — ABNORMAL LOW (ref 8.9–10.3)
Chloride: 103 mmol/L (ref 98–111)
Creatinine, Ser: 1.03 mg/dL — ABNORMAL HIGH (ref 0.44–1.00)
GFR, Estimated: >60 mL/min (ref >60)
Glucose, Bld: 118 mg/dL — ABNORMAL HIGH (ref 70–99)
Potassium: 3.9 mmol/L (ref 3.5–5.1)
Sodium: 135 mmol/L (ref 135–145)
Total Bilirubin: 0.5 mg/dL (ref 0.3–1.2)
Total Protein: 5.8 g/dL — ABNORMAL LOW (ref 6.5–8.1)

## 2022-01-29 LAB — GLUCOSE, CAPILLARY: Glucose-Capillary: 76 mg/dL (ref 70–99)

## 2022-01-29 MED ORDER — AMOXICILLIN-POT CLAVULANATE 875-125 MG PO TABS
1.0000 | ORAL_TABLET | Freq: Two times a day (BID) | ORAL | 0 refills | Status: DC
  Filled 2022-01-29: qty 56, 28d supply, fill #0

## 2022-01-29 MED ORDER — TRAMADOL HCL 50 MG PO TABS
50.0000 mg | ORAL_TABLET | Freq: Four times a day (QID) | ORAL | 0 refills | Status: AC | PRN
  Filled 2022-01-29: qty 20, 5d supply, fill #0

## 2022-01-29 MED ORDER — SPIRONOLACTONE 50 MG PO TABS
50.0000 mg | ORAL_TABLET | Freq: Every day | ORAL | 0 refills | Status: DC
  Filled 2022-01-29: qty 30, 30d supply, fill #0

## 2022-01-29 MED ORDER — VANCOMYCIN HCL 125 MG PO CAPS
125.0000 mg | ORAL_CAPSULE | Freq: Two times a day (BID) | ORAL | 0 refills | Status: DC
  Filled 2022-01-29: qty 56, 28d supply, fill #0

## 2022-01-29 MED ORDER — VANCOMYCIN HCL 125 MG PO CAPS
125.0000 mg | ORAL_CAPSULE | Freq: Every day | ORAL | 0 refills | Status: DC
  Filled 2022-01-29: qty 28, 28d supply, fill #0

## 2022-01-29 MED ORDER — AMOXICILLIN-POT CLAVULANATE 875-125 MG PO TABS: 1.0000 | ORAL_TABLET | Freq: Two times a day (BID) | ORAL | Status: DC

## 2022-01-29 MED ORDER — FUROSEMIDE 20 MG PO TABS
20.0000 mg | ORAL_TABLET | Freq: Every day | ORAL | 0 refills | Status: DC
  Filled 2022-01-29: qty 30, 30d supply, fill #0

## 2022-01-29 NOTE — Plan of Care (Signed)
  Problem: Education: Goal: Knowledge of General Education information will improve Description: Including pain rating scale, medication(s)/side effects and non-pharmacologic comfort measures Outcome: Adequate for Discharge   Problem: Health Behavior/Discharge Planning: Goal: Ability to manage health-related needs will improve Outcome: Adequate for Discharge   Problem: Clinical Measurements: Goal: Ability to maintain clinical measurements within normal limits will improve Outcome: Adequate for Discharge Goal: Will remain free from infection Outcome: Adequate for Discharge Goal: Diagnostic test results will improve Outcome: Adequate for Discharge Goal: Respiratory complications will improve Outcome: Adequate for Discharge Goal: Cardiovascular complication will be avoided Outcome: Adequate for Discharge   Problem: Activity: Goal: Risk for activity intolerance will decrease Outcome: Adequate for Discharge   Problem: Nutrition: Goal: Adequate nutrition will be maintained Outcome: Adequate for Discharge   Problem: Coping: Goal: Level of anxiety will decrease Outcome: Adequate for Discharge   Problem: Elimination: Goal: Will not experience complications related to bowel motility Outcome: Adequate for Discharge Goal: Will not experience complications related to urinary retention Outcome: Adequate for Discharge   Problem: Pain Managment: Goal: General experience of comfort will improve Outcome: Adequate for Discharge   Problem: Safety: Goal: Ability to remain free from injury will improve Outcome: Adequate for Discharge   Problem: Skin Integrity: Goal: Risk for impaired skin integrity will decrease Outcome: Adequate for Discharge   Problem: Fluid Volume: Goal: Hemodynamic stability will improve Outcome: Adequate for Discharge   Problem: Clinical Measurements: Goal: Diagnostic test results will improve Outcome: Adequate for Discharge Goal: Signs and symptoms of  infection will decrease Outcome: Adequate for Discharge   Problem: Respiratory: Goal: Ability to maintain adequate ventilation will improve Outcome: Adequate for Discharge   Problem: Education: Goal: Ability to describe self-care measures that may prevent or decrease complications (Diabetes Survival Skills Education) will improve Outcome: Adequate for Discharge Goal: Individualized Educational Video(s) Outcome: Adequate for Discharge   Problem: Coping: Goal: Ability to adjust to condition or change in health will improve Outcome: Adequate for Discharge   Problem: Fluid Volume: Goal: Ability to maintain a balanced intake and output will improve Outcome: Adequate for Discharge   Problem: Health Behavior/Discharge Planning: Goal: Ability to identify and utilize available resources and services will improve Outcome: Adequate for Discharge Goal: Ability to manage health-related needs will improve Outcome: Adequate for Discharge   Problem: Metabolic: Goal: Ability to maintain appropriate glucose levels will improve Outcome: Adequate for Discharge   Problem: Nutritional: Goal: Maintenance of adequate nutrition will improve Outcome: Adequate for Discharge Goal: Progress toward achieving an optimal weight will improve Outcome: Adequate for Discharge   Problem: Skin Integrity: Goal: Risk for impaired skin integrity will decrease Outcome: Adequate for Discharge   Problem: Tissue Perfusion: Goal: Adequacy of tissue perfusion will improve Outcome: Adequate for Discharge   

## 2022-01-29 NOTE — Discharge Summary (Signed)
Physician Discharge Summary  Mallory Stewart MPN:[id number] DOB: Oct 09, 1960 DOA: 01/22/2022  PCP: [name], MD  Admit date: 01/22/2022 Discharge date: 01/29/2022  Admitted From: Home Disposition:  Home  Discharge Condition:Stable CODE STATUS:FULL Diet recommendation: Low sodium  Brief/Interim Summary:  Patient is a 62 year old female with history of chronic pancreatitis with pseudocyst with extension into the retroperitoneum, type 2 diabetes, cirrhosis, hypertension who presented with abdominal distention, erythema of the left flank, chills, draining from left flank.  No history of fever.  CT imaging showed features of cellulitis of the left flank, large amount of ascites , a large peripherally enhancing complex fluid collection  within the left posterior pararenal space, with posterior extension through the posterior left abdominal wall.  General surgery, IR, GI, ID consulted and following. Started on broad-spectrum antibiotics.  General surgery did  incision and drainage of abscess on the back on 9/21.  GI  did ERCP on 9/23.  Surgery, GI cleared for discharge.  Hemodynamically stable for discharge today with oral antibiotics.   Following problems were addressed during her hospitalization:  Sepsis secondary to cellulitis, draining pseudocyst: Presented with tachycardia, tachypnea, elevated lactate.  Found to have cellulitis of the left flank, large amount of ascites , a large peripherally enhancing complex fluid collection  within the left posterior pararenal space, with posterior extension through the posterior left abdominal wall.  Started on broad-spectrum antibiotics.  IR consulted and she underwent ultrasound-guided aspiration of the left posterior abdominal wall collection.  Fluid was bloody, purulent.  General surgery did I&D on 9/21. aerobic/anaerobic cultures showed Klebsiella pneumoniae. Currently on Unasyn.  Changed to Augmentin on discharge as per ID, she will continue that  for 4 weeks, she will also be prescribed oral vancomycin for C. difficile prophylaxis   alcoholic cirrhosis/pancreatic pseudocyst: GI following.  Has history of portal hypertension.  Has history of chronic alcoholic pancreatitis with recent peripancreatic/retroperitoneal infected pseudocyst/abscess.  MRCP showed near-complete resolution of left abdominal pancreatic pseudocyst,hepatic cirrhosis and portal venous hypertension resulting in splenomegaly and gastroesophageal varices,diffuse biliary ductal dilatation with stricture within the pancreatic head. No radiographic signs of pancreatic neoplasm. On 9/23, ERCP was done  to  rule out pancreatic duct leak .  ERCP was not that satisfactory.  Found to have possible distal pancreatic duct stricture.  Status post biliary and pancreatic sphincterectomy, pancreatic duct stent placement. Liver enzymes went up but not improving.  She denies abdomen pain and abdomen is benign on examination.  Lipase normal.  She will follow-up with gastroenterology as an outpatient.  We recommend to do a CMP test in a week   Type 2 diabetes: Continue current insulin regimen.  Monitor blood sugars   Hypertension: On Lasix, spironolactone at home.  resumed   Chronic hyponatremia: Likely from cirrhosis.  Sodium remained stable    Hypomagnesemia: Supplemented with magnesium and corrected    Discharge Diagnoses:  Principal Problem:   Sepsis ([place]) Active Problems:   Ascites   HTN (hypertension)   Pancreatic abscess   Abnormal CT of the abdomen   Chronic hyponatremia   Type 2 diabetes mellitus with hyperglycemia, with long-term current use of insulin (HCC)   Pancreatic pseudocyst   Cellulitis   Allergic reaction   Acute pancreatic fluid collection   Cellulitis of back   Infected pseudocyst of pancreas   Cellulitis and abscess of trunk   Infected pancreatic pseudocyst   Leakage from tail of pancreas   Medication monitoring encounter   Abdominal wall abscess    Atrophic  pancreas   Alcohol-induced chronic pancreatitis [facility])   Pancreatic duct leak   Decompensated hepatic cirrhosis (HCC)   Abnormal LFTs   Pancreatic duct stricture   Biliary stricture   Clostridioides difficile infection    Discharge Instructions  Discharge Instructions     Diet - low sodium heart healthy   Complete by: As directed    Discharge instructions   Complete by: As directed    1)Please take prescribed medications as instructed 2)Follow up in office with gastroenterology on  10/24 at 10 am.They will arrange to obtain pancreas protocol CT abdomen/pelvis in 3 weeks to reevaluate pancreatic fluid collections/infected pseudocyst.  3)Follow up with general surgery as an outpatient.  Name and number the provider has been addressed 4)Do a CMP test in a week to check your liver enzymes and kidney function        Increase activity slowly   Complete by: As directed    No wound care   Complete by: As directed    Change dressing every day      Allergies as of 01/29/2022       Reactions   Ceftriaxone Hives   Immediate bright red itchy rash on bilateral legs after starting infusion.     Gluten Meal Other (See Comments)   Stomach distress   Zocor [simvastatin] Other (See Comments)   Elevated lfts?        Medication List     TAKE these medications    amoxicillin-clavulanate 875-125 MG tablet Commonly known as: AUGMENTIN Take 1 tablet by mouth 2 (two) times daily for 28 days.   aspirin EC 81 MG tablet Take 1 tablet (81 mg total) by mouth 2 (two) times daily. Swallow whole.   Basaglar KwikPen 100 UNIT/ML Inject 36 Units into the skin daily. What changed: how much to take   CALCIUM+D3 PO Take 1 tablet by mouth daily.   cetirizine 10 MG tablet Commonly known as: ZYRTEC Take 10 mg by mouth at bedtime.   cholecalciferol 25 MCG (1000 UNIT) tablet Commonly known as: VITAMIN D3 Take 1,000 Units by mouth daily.   cyanocobalamin 2000 MCG tablet Take 2,000  mcg by mouth daily.   furosemide 20 MG tablet Commonly known as: LASIX Take 1 tablet (20 mg total) by mouth daily. What changed: when to take this   insulin lispro 100 UNIT/ML KwikPen Commonly known as: HumaLOG KwikPen Inject 6-10 Units into the skin 3 (three) times daily as needed (high blood sugar). Max Daily 30 units. Sliding Scale What changed:  how much to take when to take this additional instructions   Insulin Pen Needle 32G X 4 MM Misc 1 Device by Does not apply route in the morning, at noon, in the evening, and at bedtime.   meclizine 25 MG tablet Commonly known as: ANTIVERT Take 1/2 tablet po qhs prn vertigo What changed:  how much to take how to take this when to take this reasons to take this additional instructions   Melatonin 10 MG Tabs Take 20 mg by mouth at bedtime.   ONE TOUCH ULTRA 2 w/Device Kit Use to check blood sugars two times day   OneTouch Ultra test strip Generic drug: glucose blood CHECK BLOOD SUGAR TWICE DAILY AS DIRECTED   onetouch ultrasoft lancets Use to check blood sugar two times a day.   pantoprazole 40 MG tablet Commonly known as: PROTONIX TAKE 1 TABLET BY MOUTH TWICE DAILY BEFORE A MEAL What changed: when to take this   spironolactone 50 MG  tablet Commonly known as: ALDACTONE Take 1 tablet (50 mg total) by mouth daily. What changed:  medication strength how much to take   traMADol 50 MG tablet Commonly known as: ULTRAM Take 1 tablet (50 mg total) by mouth every 6 (six) hours as needed for up to 5 days.   vancomycin 125 MG capsule Commonly known as: VANCOCIN Take 1 capsule (125 mg total) by mouth 2 (two) times daily for 28 days.        Follow-up Information     Kinsinger, Arta Bruce, MD. Schedule an appointment as soon as possible for a visit in 2 week(s).   Specialty: General Surgery Contact information: [address] [place] [address] [place] [address] [phone]         Willia Craze, NP Follow up on  02/27/2022.   Specialty: Gastroenterology Why: at 10:00 am Contact information: [place] [address] [phone]                Allergies  Allergen Reactions   Ceftriaxone Hives    Immediate bright red itchy rash on bilateral legs after starting infusion.     Gluten Meal Other (See Comments)    Stomach distress   Zocor [Simvastatin] Other (See Comments)    Elevated lfts?    Consultations: GI, general surgery, ID   Procedures/Studies: ECHOCARDIOGRAM COMPLETE  Result Date: 01/28/2022    ECHOCARDIOGRAM REPORT   Patient Name:   MARYSUE FAIT Date of Exam: 01/28/2022 Medical Rec #:  [id number]          Height:       69.0 in Accession #:    [id number]         Weight:       155.2 lb Date of Birth:  [birth year]          BSA:          1.855 m Patient Age:    8 years           BP:           108/84 mmHg Patient Gender: F                  HR:           68 bpm. Exam Location:  Inpatient Procedure: 2D Echo Indications:    History of ascites  History:        Patient has prior history of Echocardiogram examinations, most                 recent 08/14/2018. Risk Factors:Diabetes and Hypertension.  Sonographer:    Harvie Junior Referring Phys: [id number] [place]  Sonographer Comments: Supine. IMPRESSIONS  1. Left ventricular ejection fraction, by estimation, is 55 to 60%. The left ventricle has normal function. The left ventricle has no regional wall motion abnormalities. Left ventricular diastolic parameters were normal.  2. Right ventricular systolic function is normal. The right ventricular size is normal. There is normal pulmonary artery systolic pressure.  3. Left atrial size was mildly dilated.  4. The mitral valve is abnormal. Mild mitral valve regurgitation. No evidence of mitral stenosis.  5. The aortic valve was not well visualized. There is moderate calcification of the aortic valve. There is moderate thickening of the aortic valve. Aortic valve regurgitation is  not visualized. Aortic valve sclerosis/calcification is present, without any evidence of aortic stenosis.  6. The inferior vena cava is normal in size with greater than 50% respiratory variability,  suggesting right atrial pressure of 3 mmHg. FINDINGS  Left Ventricle: Left ventricular ejection fraction, by estimation, is 55 to 60%. The left ventricle has normal function. The left ventricle has no regional wall motion abnormalities. The left ventricular internal cavity size was normal in size. There is  no left ventricular hypertrophy. Left ventricular diastolic parameters were normal. Right Ventricle: The right ventricular size is normal. No increase in right ventricular wall thickness. Right ventricular systolic function is normal. There is normal pulmonary artery systolic pressure. The tricuspid regurgitant velocity is 2.09 m/s, and  with an assumed right atrial pressure of 3 mmHg, the estimated right ventricular systolic pressure is 40.9 mmHg. Left Atrium: Left atrial size was mildly dilated. Right Atrium: Right atrial size was normal in size. Pericardium: There is no evidence of pericardial effusion. Mitral Valve: The mitral valve is abnormal. There is mild thickening of the mitral valve leaflet(s). There is mild calcification of the mitral valve leaflet(s). Mild mitral annular calcification. Mild mitral valve regurgitation. No evidence of mitral valve stenosis. Tricuspid Valve: The tricuspid valve is normal in structure. Tricuspid valve regurgitation is mild . No evidence of tricuspid stenosis. Aortic Valve: The aortic valve was not well visualized. There is moderate calcification of the aortic valve. There is moderate thickening of the aortic valve. Aortic valve regurgitation is not visualized. Aortic valve sclerosis/calcification is present, without any evidence of aortic stenosis. Aortic valve mean gradient measures 5.0 mmHg. Aortic valve peak gradient measures 9.6 mmHg. Aortic valve area, by VTI measures  2.05 cm. Pulmonic Valve: The pulmonic valve was normal in structure. Pulmonic valve regurgitation is trivial. No evidence of pulmonic stenosis. Aorta: The aortic root is normal in size and structure. Venous: The inferior vena cava is normal in size with greater than 50% respiratory variability, suggesting right atrial pressure of 3 mmHg. IAS/Shunts: No atrial level shunt detected by color flow Doppler.  LEFT VENTRICLE PLAX 2D LVIDd:         4.75 cm     Diastology LVIDs:         2.90 cm     LV e' medial:    9.79 cm/s LV PW:         0.80 cm     LV E/e' medial:  7.8 LV IVS:        0.80 cm     LV e' lateral:   13.80 cm/s LVOT diam:     1.90 cm     LV E/e' lateral: 5.5 LV SV:         69 LV SV Index:   37 LVOT Area:     2.84 cm  LV Volumes (MOD) LV vol d, MOD A2C: 79.3 ml LV vol d, MOD A4C: 83.1 ml LV vol s, MOD A2C: 34.1 ml LV vol s, MOD A4C: 32.0 ml LV SV MOD A2C:     45.2 ml LV SV MOD A4C:     83.1 ml LV SV MOD BP:      48.6 ml RIGHT VENTRICLE RV Basal diam:  3.50 cm RV Mid diam:    2.90 cm RV S prime:     20.90 cm/s TAPSE (M-mode): 2.1 cm LEFT ATRIUM             Index        RIGHT ATRIUM           Index LA diam:        4.10 cm 2.21 cm/m   RA Area:  11.00 cm LA Vol (A2C):   46.2 ml 24.91 ml/m  RA Volume:   22.00 ml  11.86 ml/m LA Vol (A4C):   53.4 ml 28.79 ml/m LA Biplane Vol: 54.3 ml 29.27 ml/m  AORTIC VALVE                     PULMONIC VALVE AV Area (Vmax):    1.96 cm      PV Vmax:          0.87 m/s AV Area (Vmean):   2.00 cm      PV Peak grad:     3.0 mmHg AV Area (VTI):     2.05 cm      PR End Diast Vel: 3.21 msec AV Vmax:           155.00 cm/s AV Vmean:          104.000 cm/s AV VTI:            0.338 m AV Peak Grad:      9.6 mmHg AV Mean Grad:      5.0 mmHg LVOT Vmax:         107.00 cm/s LVOT Vmean:        73.300 cm/s LVOT VTI:          0.244 m LVOT/AV VTI ratio: 0.72  AORTA Ao Root diam: 3.20 cm Ao Asc diam:  3.30 cm MITRAL VALVE               TRICUSPID VALVE MV Area (PHT): 3.31 cm    TR Peak grad:    17.5 mmHg MV Decel Time: 229 msec    TR Vmax:        209.00 cm/s MR Peak grad: 67.9 mmHg MR Vmax:      412.00 cm/s  SHUNTS MV E velocity: 75.90 cm/s  Systemic VTI:  0.24 m MV A velocity: 59.70 cm/s  Systemic Diam: 1.90 cm MV E/A ratio:  1.27 Jenkins Rouge MD Electronically signed by Jenkins Rouge MD Signature Date/Time: 01/28/2022/3:25:36 PM    Final    DG ERCP  Result Date: 01/27/2022 CLINICAL DATA:  Biliary obstruction EXAM: ERCP TECHNIQUE: Multiple spot images obtained with the fluoroscopic device and submitted for interpretation post-procedure. FLUOROSCOPY: Radiation Exposure Index (as provided by the fluoroscopic device): 93.79 mGy Kerma COMPARISON:  MRCP 01/25/2022 FINDINGS: A total of 11 intraoperative spot images are submitted for review. The images demonstrate a flexible duodenal scope in the descending duodenum followed by wire cannulation of the pancreatic duct and common bile duct. On the final images, a plastic stent has been placed in the pancreatic duct. IMPRESSION: ERCP with placement of a plastic pancreatic stent. These images were submitted for radiologic interpretation only. Please see the procedural report for the amount of contrast and the fluoroscopy time utilized. Electronically Signed   By: Jacqulynn Cadet M.D.   On: 01/27/2022 14:51   MR ABDOMEN MRCP W WO CONTAST  Result Date: 01/25/2022 CLINICAL DATA:  Abdominal distention. Cirrhosis and portal venous hypertension. Chronic pancreatitis with infected pseudocyst. Recent aspiration of left abdominal wall fluid collection. EXAM: MRI ABDOMEN WITHOUT AND WITH CONTRAST (INCLUDING MRCP) TECHNIQUE: Multiplanar multisequence MR imaging of the abdomen was performed both before and after the administration of intravenous contrast. Heavily T2-weighted images of the biliary and pancreatic ducts were obtained, and three-dimensional MRCP images were rendered by post processing. CONTRAST:  11mL GADAVIST GADOBUTROL 1 MMOL/ML IV SOLN COMPARISON:  CT on  01/22/2022 FINDINGS: Lower chest: No  acute findings. Hepatobiliary: Hepatic cirrhosis is demonstrated. No hepatic masses identified. Large amount of ascites is seen as well as diffuse mesenteric and body wall edema. Prior cholecystectomy. Biliary ductal dilatation is seen with common bile duct measuring 17 mm in diameter. Stricture of the distal common bile duct is seen within the pancreatic head. Pancreas: No pancreatic mass identified. Pancreas is atrophic. Previously seen large peripancreatic fluid collection adjacent to the peripancreatic tail which extends into the adjacent left posterolateral abdominal wall soft tissues has significantly decreased in size since previous study. Residual rim enhancing fluid collection seen in the left posterior abdominal wall subcutaneous tissues measures 5.3 x 3.0 cm. Spleen: Splenomegaly is again seen, consistent portal venous hypertension. Adrenals/Urinary Tract: No suspicious masses identified. No evidence of hydronephrosis. Stomach/Bowel: Unremarkable. Vascular/Lymphatic: No pathologically enlarged lymph nodes identified. Recanalization of paraumbilical veins and portosystemic venous collaterals are seen as well as varices at the GE junction, consistent with portal venous hypertension. No acute vascular findings. Other:  None. Musculoskeletal:  No suspicious bone lesions identified. IMPRESSION: Near-complete resolution of left abdominal pancreatic pseudocyst. Residual complex fluid collection in the left posterior abdominal wall soft tissues measures 5.3 x 3.0 cm. Hepatic cirrhosis and portal venous hypertension resulting in splenomegaly and gastroesophageal varices. No evidence of hepatic neoplasm. Large amount of ascites, with diffuse mesenteric and body wall edema. Diffuse biliary ductal dilatation with stricture within the pancreatic head. No radiographic signs of pancreatic neoplasm. Electronically Signed   By: Marlaine Hind M.D.   On: 01/25/2022 09:11   MR 3D Recon  At Scanner  Result Date: 01/25/2022 CLINICAL DATA:  Abdominal distention. Cirrhosis and portal venous hypertension. Chronic pancreatitis with infected pseudocyst. Recent aspiration of left abdominal wall fluid collection. EXAM: MRI ABDOMEN WITHOUT AND WITH CONTRAST (INCLUDING MRCP) TECHNIQUE: Multiplanar multisequence MR imaging of the abdomen was performed both before and after the administration of intravenous contrast. Heavily T2-weighted images of the biliary and pancreatic ducts were obtained, and three-dimensional MRCP images were rendered by post processing. CONTRAST:  51mL GADAVIST GADOBUTROL 1 MMOL/ML IV SOLN COMPARISON:  CT on 01/22/2022 FINDINGS: Lower chest: No acute findings. Hepatobiliary: Hepatic cirrhosis is demonstrated. No hepatic masses identified. Large amount of ascites is seen as well as diffuse mesenteric and body wall edema. Prior cholecystectomy. Biliary ductal dilatation is seen with common bile duct measuring 17 mm in diameter. Stricture of the distal common bile duct is seen within the pancreatic head. Pancreas: No pancreatic mass identified. Pancreas is atrophic. Previously seen large peripancreatic fluid collection adjacent to the peripancreatic tail which extends into the adjacent left posterolateral abdominal wall soft tissues has significantly decreased in size since previous study. Residual rim enhancing fluid collection seen in the left posterior abdominal wall subcutaneous tissues measures 5.3 x 3.0 cm. Spleen: Splenomegaly is again seen, consistent portal venous hypertension. Adrenals/Urinary Tract: No suspicious masses identified. No evidence of hydronephrosis. Stomach/Bowel: Unremarkable. Vascular/Lymphatic: No pathologically enlarged lymph nodes identified. Recanalization of paraumbilical veins and portosystemic venous collaterals are seen as well as varices at the GE junction, consistent with portal venous hypertension. No acute vascular findings. Other:  None.  Musculoskeletal:  No suspicious bone lesions identified. IMPRESSION: Near-complete resolution of left abdominal pancreatic pseudocyst. Residual complex fluid collection in the left posterior abdominal wall soft tissues measures 5.3 x 3.0 cm. Hepatic cirrhosis and portal venous hypertension resulting in splenomegaly and gastroesophageal varices. No evidence of hepatic neoplasm. Large amount of ascites, with diffuse mesenteric and body wall edema. Diffuse biliary ductal dilatation with stricture within the  pancreatic head. No radiographic signs of pancreatic neoplasm. Electronically Signed   By: Marlaine Hind M.D.   On: 01/25/2022 09:11   IR US Guide Bx Asp/Drain  Result Date: 01/24/2022 INDICATION: 61 year old with pancreatic pseudocyst that is extending through the posterior left abdominal wall with fluid weeping from the skin. EXAM: ULTRASOUND-GUIDED ASPIRATION OF LEFT POSTERIOR ABDOMINAL WALL FLUID COLLECTION MEDICATIONS: 1% lidocaine for local anesthetic ANESTHESIA/SEDATION: None COMPLICATIONS: None immediate. PROCEDURE: Informed written consent was obtained from the patient after a thorough discussion of the procedural risks, benefits and alternatives. A timeout was performed prior to the initiation of the procedure. Patient was placed prone. Patient has a visible and palpable abnormality along the left posterior abdomen which is draining watery yellow fluid. This area was evaluated with ultrasound. This area was prepped with Betadine and sterile field was created. Skin was anesthetized along the medial aspect of the left back. Using ultrasound guidance, a 19 gauge Yueh catheter was directed into the heterogeneous collection just below the skin surface. Initially, no significant fluid could be aspirated. The Yueh catheter was manipulated multiple times until eventually 15 mL bloody thick fluid was aspirated. No additional fluid could be aspirated. In addition, large amount of watery yellow fluid was expressed  from the small pustules on the skin surface. Yueh catheter was removed. Bandage placed over the puncture site. FINDINGS: The area of concern is the left flank just posterior to the left kidney and involving the posterior abdominal wall. Focal skin erythema with small yellow pustules that are weeping watery yellow fluid. Ultrasound demonstrated a heterogeneous collection just below the skin surface and superficial to the left kidney. Comparing with the ultrasound-guided aspiration from 12/29/2021, it appears that the fluid is more hyperechoic and probably more complex. Only 15 mL of thick bloody fluid could be aspirated from this area. Large amount of watery yellow fluid was expressed from the small pustules on the skin surface. Overall, this area of inflammation along the left flank appeared to be decompressed or less distended by the end of the procedure. IMPRESSION: Ultrasound-guided aspiration of the complex thick fluid involving the left posterior abdominal wall. Based on the prior imaging findings, this fluid collection is concerning for a complex pseudocyst. Fluid was sent for culture, amylase and lipase. Electronically Signed   By: Markus Daft M.D.   On: 01/24/2022 09:55   US Paracentesis  Result Date: 01/23/2022 INDICATION: Patient with history of pancreatic pseudocyst, chronic pancreatitis, alcoholic cirrhosis, recurrent ascites. Request received for diagnostic and therapeutic paracentesis up to 3 liters. EXAM: ULTRASOUND GUIDED DIAGNOSTIC AND THERAPEUTIC PARACENTESIS MEDICATIONS: 10 ml 1% lidocaine COMPLICATIONS: None immediate. PROCEDURE: Informed written consent was obtained from the patient after a discussion of the risks, benefits and alternatives to treatment. A timeout was performed prior to the initiation of the procedure. Initial ultrasound scanning demonstrates a moderate amount of ascites within the right lower abdominal quadrant. The right lower abdomen was prepped and draped in the usual  sterile fashion. 1% lidocaine was used for local anesthesia. Following this, a 19 gauge, 10-cm, Yueh catheter was introduced. An ultrasound image was saved for documentation purposes. The paracentesis was performed. The catheter was removed and a dressing was applied. The patient tolerated the procedure well without immediate post procedural complication. FINDINGS: A total of approximately 2.9 liters of yellow fluid was removed. Samples were sent to the laboratory as requested by the clinical team. IMPRESSION: Successful ultrasound-guided diagnostic and therapeutic paracentesis yielding 2.9 liters of peritoneal fluid. PLAN: The patient has  required >/=2 paracenteses in a 30 day period and a formal evaluation by the [facility] has been arranged. Read by: Rowe Robert, PA-C Electronically Signed   By: Jerilynn Mages.  [place] M.D.   On: 01/23/2022 12:11   CT ABDOMEN PELVIS W CONTRAST  Result Date: 01/22/2022 CLINICAL DATA:  Abdominal pain, acute, nonlocalized. Left flank pain. History of pancreatic pseudocyst. EXAM: CT ABDOMEN AND PELVIS WITH CONTRAST TECHNIQUE: Multidetector CT imaging of the abdomen and pelvis was performed using the standard protocol following bolus administration of intravenous contrast. RADIATION DOSE REDUCTION: This exam was performed according to the departmental dose-optimization program which includes automated exposure control, adjustment of the mA and/or kV according to patient size and/or use of iterative reconstruction technique. CONTRAST:  152mL OMNIPAQUE IOHEXOL 300 MG/ML  SOLN COMPARISON:  Abdominopelvic CT 01/10/2022 and 01/01/2022. FINDINGS: Lower chest: Similar atelectasis or scarring at both lung bases. No significant pleural or pericardial effusion. There is aortic and coronary artery atherosclerosis. Hepatobiliary: Contour irregularity of the liver and relative enlargement of the caudate and left lobes again noted, suspicious for  cirrhosis. No focal liver lesions are identified. The gallbladder is surgically absent. There are chronic small calcified stones within the cystic duct remnant. Stable mild extrahepatic biliary dilatation without definite signs of choledocholithiasis. No intrahepatic biliary dilatation. Pancreas: Chronic pancreatic atrophy without evidence of acute inflammation or pancreatic ductal dilatation. Peripancreatic collection adjacent to the pancreatic tail shows further decrease in size, now measuring 1.6 cm on image 33/2 (previously 2.2 cm). Spleen: Spleen size is stable at the upper limits of normal. Adrenals/Urinary Tract: Both adrenal glands appear normal. No evidence of urinary tract calculus, suspicious renal lesion or hydronephrosis. The bladder appears unremarkable for its degree of distention. Stomach/Bowel: No enteric contrast administered. The stomach appears unremarkable for its degree of distension. No evidence of bowel wall thickening, distention or surrounding inflammatory change. Scattered colonic diverticulosis. Vascular/Lymphatic: There are no enlarged abdominal or pelvic lymph nodes. No acute vascular findings are identified. Mild aortic and branch vessel atherosclerosis without aneurysm. There are multiple chronic portosystemic venous collaterals in the left upper quadrant of the abdomen. Reproductive: Small left fundal uterine fibroid.  No adnexal mass. Other: A large amount of ascites is again noted which is similar in volume to the most recent prior studies. A large peripherally enhancing complex fluid collection is again noted within the left posterior pararenal space, with posterior extension through the posterior left abdominal wall. This has irregular margins and is difficult to measure, but shows further decrease in size compared with the most recent study. It currently measures approximately 7.5 x 3.9 cm transverse on image 44/2 (previously 7.3 x 5.5 cm). On the coronal images, it measures up  to 15.2 cm on image 110/4 (previously 14.8 cm). This may extend to the skin surface, and there is increased surrounding inflammation in the subcutaneous fat without soft tissue emphysema. No new or enlarging fluid collections are identified. Musculoskeletal: No acute or significant osseous findings. Mild spondylosis. No osseous destruction identified. IMPRESSION: 1. Interval slight decrease in size of complex peripherally enhancing fluid collection extending from the left posterior pararenal space through the abdominal wall into the subcutaneous fat. This has been previously aspirated under ultrasound and is felt to reflect a pseudocyst. This may extend to the skin surface, and there appears to be increased surrounding subcutaneous edema which could reflect superimposed cellulitis. 2. No new or enlarging fluid collections are identified. There is a large amount of ascites which appears unchanged.  3. No acute abdominopelvic findings. 4. Hepatic cirrhosis with portal hypertension manifesting as multiple portosystemic collaterals. Electronically Signed   By: Richardean Sale M.D.   On: 01/22/2022 13:23   DG Chest Port 1 View  Result Date: 01/22/2022 CLINICAL DATA:  Questionable sepsis.  Evaluate for abnormality. EXAM: PORTABLE CHEST 1 VIEW COMPARISON:  AP chest 08/07/2021 FINDINGS: Cardiac silhouette and mediastinal contours within normal limits. Moderate right mild left decreased lung volumes, mildly worsened from prior. Right basilar horizontal linear likely subsegmental atelectasis. No definite pleural effusion. No pneumothorax is seen. No acute skeletal abnormality. IMPRESSION: Low lung volumes with right basilar horizontal linear likely subsegmental atelectasis. It is difficult to entirely exclude underlying pneumonia, however that is felt less likely. Electronically Signed   By: Yvonne Kendall M.D.   On: 01/22/2022 12:54   US Paracentesis  Result Date: 01/11/2022 INDICATION: Patient with history of  pancreatic pseudocyst, chronic pancreatitis, alcoholic cirrhosis, recurrent ascites; request received for diagnostic and therapeutic paracentesis. EXAM: ULTRASOUND GUIDED DIAGNOSTIC AND THERAPEUTIC PARACENTESIS MEDICATIONS: 8 mL 1% lidocaine COMPLICATIONS: None immediate. PROCEDURE: Informed written consent was obtained from the patient after a discussion of the risks, benefits and alternatives to treatment. A timeout was performed prior to the initiation of the procedure. Initial ultrasound scanning demonstrates a large amount of ascites within the right lower abdominal quadrant. The right lower abdomen was prepped and draped in the usual sterile fashion. 1% lidocaine was used for local anesthesia. Following this, a 19 gauge, 7-cm, Yueh catheter was introduced. An ultrasound image was saved for documentation purposes. The paracentesis was performed. The catheter was removed and a dressing was applied. The patient tolerated the procedure well without immediate post procedural complication. FINDINGS: A total of approximately 5.9 liters of yellow fluid was removed. Samples were sent to the laboratory as requested by the clinical team. IMPRESSION: Successful ultrasound-guided diagnostic and therapeutic paracentesis yielding 5.9 liters of peritoneal fluid. PLAN: The patient has required >/=2 paracenteses in a 30 day period and a screening evaluation by the [facility] has been arranged. Read by: Rowe Robert, PA-C Electronically Signed   By: Markus Daft M.D.   On: 01/11/2022 10:52   CT ABDOMEN PELVIS W CONTRAST  Result Date: 01/10/2022 CLINICAL DATA:  Evaluate abdominal distension. Pancreatic pseudocyst. EXAM: CT ABDOMEN AND PELVIS WITH CONTRAST TECHNIQUE: Multidetector CT imaging of the abdomen and pelvis was performed using the standard protocol following bolus administration of intravenous contrast. RADIATION DOSE REDUCTION: This exam was performed according to the  departmental dose-optimization program which includes automated exposure control, adjustment of the mA and/or kV according to patient size and/or use of iterative reconstruction technique. CONTRAST:  175mL OMNIPAQUE IOHEXOL 300 MG/ML  SOLN COMPARISON:  01/01/2022 FINDINGS: Lower chest: Mild subsegmental atelectasis noted within the lung bases. Hepatobiliary: The liver appears cirrhotic with hypertrophy of the lateral segment of left hepatic lobe and caudate lobe. Contour the liver is slightly nodular. No focal liver lesion identified. Previous cholecystectomy. Again noted are several tiny stones identified within the cystic duct remnant. Unchanged common bile duct dilatation which measures 1 cm in diameter. Pancreas: Changes of chronic pancreatitis with punctate calcifications within the head of pancreas are again noted. No acute inflammatory changes identified within the pancreas. No pancreatic mass. Peripherally enhancing fluid collection associated with the tail of pancreas is again noted measuring 2.2 x 1.7 cm, image 36/2. This is compared with 3.5 by 2.0 cm previously. Large peripherally enhancing, multilocular fluid collection is seen within the left  posterior pararenal space and extending into the left posterior abdominal wall. On today's study this measures 7.3 by 5.5 by 14.8 cm, image 44/2 and image 110/5. Previously this measured 7.3 x 5.7 by 14.6 cm. Spleen: Spleen is within normal limits measuring 12.7 cm cranial caudal. Adrenals/Urinary Tract: Normal adrenal glands. No kidney mass, nephrolithiasis, or hydronephrosis. Urinary bladder is unremarkable. Stomach/Bowel: Stomach appears nondistended. There is mild increase caliber of the transverse portion of the duodenum which measures up to 3.8 cm. The remaining small bowel loops are normal in caliber. No pathologic dilatation of the colon to suggest obstruction. Vascular/Lymphatic: The portal vein, portal venous confluence and SMV are patent. 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 Gastroesophageal varices are again noted. No signs of abdominopelvic adenopathy. Aortic atherosclerosis without aneurysm. Reproductive: Uterus and bilateral adnexa are unremarkable. Other: There is a large volume of ascites within the abdomen and pelvis. When compared with the previous exam this is stable to mildly increased from the prior study. Musculoskeletal: No acute or significant osseous findings. IMPRESSION: 1. Similar size of peripherally enhancing large pseudocyst involving the left posterior pararenal space extending into the left posterior abdominal wall. Smaller pseudocyst within tail of pancreas is slightly decreased in size from previous exam. 2. Large volume of ascites within the abdomen and pelvis. When compared with the previous exam this is stable to mildly increased from the prior study. 3. Cirrhosis with stigmata of portal venous hypertension. 4. Unchanged appearance of dilatation of the common bile duct which is likely secondary to stricture within the pancreatic head. 5. Changes of chronic pancreatitis identified. 6. 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 7. Aortic Atherosclerosis (ICD10-I70.0). Electronically Signed   By: Kerby Moors M.D.   On: 01/10/2022 13:34   CT ABDOMEN W WO CONTRAST  Result Date: 01/01/2022 CLINICAL DATA:  Worsening abdominal and back pain. Pancreatic pseudocysts and cirrhosis. EXAM: CT ABDOMEN WITHOUT AND WITH CONTRAST TECHNIQUE: Multidetector CT imaging of the abdomen was performed following the standard protocol before and following the bolus administration of intravenous contrast. RADIATION DOSE REDUCTION: This exam was performed according to the departmental dose-optimization program which includes automated exposure control, adjustment of the mA and/or kV according to patient size and/or use of iterative reconstruction technique. CONTRAST:   164mL OMNIPAQUE IOHEXOL 300 MG/ML  SOLN COMPARISON:  12/29/2021 FINDINGS: Lower chest: No acute findings. Hepatobiliary: Hepatic cirrhosis is again demonstrated. No evidence of hepatic mass. Recanalization of paraumbilical veins is consistent with portal venous hypertension. Prior cholecystectomy noted. A few tiny less than 5 mm calculi are again seen in the cystic duct remnant. Stable dilatation of the common bile duct measuring 1.6 cm, with probable stricture of the distal common bile duct within the pancreatic head. Pancreas: Scattered tiny punctate calcifications are seen in the pancreatic head, suspicious for chronic pancreatitis. No evidence acute peripancreatic inflammatory changes. No evidence of pancreatic mass. Rim enhancing fluid collection is again seen involving the pancreatic tail measuring 3.5 x 2.0 cm, consistent with a small pseudocyst. A large multilocular rim enhancing fluid collection is again seen in the left posterior pararenal space. This measures 14 x 7 by 7.3 x 5.7 cm, without significant change, consistent with pancreatic pseudocysts. Moderate abdominal ascites shows no significant change. Spleen:  Within normal limits in size and appearance. Adrenals/Urinary Tract: No suspicious masses identified. No evidence of hydronephrosis. Stomach/Bowel: Unremarkable. Vascular/Lymphatic: No pathologically enlarged lymph nodes identified. No acute vascular findings. Chronic thrombosis of the proximal  vein is seen with reconstitution of the distal splenic vein. Mild gastroesophageal varices are again seen, consistent with portal venous hypertension. Other:  None. Musculoskeletal:  No suspicious bone lesions identified. IMPRESSION: Stable pancreatic pseudocysts involving the pancreatic tail and in the left posterior pararenal space. Moderate ascites, without significant change. Stable hepatic cirrhosis and findings of portal venous hypertension. No evidence of hepatic neoplasm. Stable dilatation of  common bile duct, with probable distal stricture within the pancreatic head. Chronic pancreatitis is suspected, and no pancreatic mass identified. Recommend correlation with liver function tests, and consider abdomen MRI and MRCP without and with contrast for further evaluation if clinically warranted. Electronically Signed   By: Marlaine Hind M.D.   On: 01/01/2022 15:27      Subjective: Patient seen and examined at bedside today.  Hemodynamically stable for discharge.  Discharge Exam: Vitals:   01/29/22 0144 01/29/22 0433  BP: (!) 151/94 136/84  Pulse: 68 (!) 59  Resp: 18 18  Temp: 98 F (36.7 C) 97.6 F (36.4 C)  SpO2: 100% 99%   Vitals:   01/28/22 1743 01/28/22 2102 01/29/22 0144 01/29/22 0433  BP: (!) 135/96 (!) 153/78 (!) 151/94 136/84  Pulse: 64 60 68 (!) 59  Resp: $Remo'19 18 18 18  'dFNpe$ Temp: 98.1 F (36.7 C) 97.8 F (36.6 C) 98 F (36.7 C) 97.6 F (36.4 C)  TempSrc: Oral Oral Oral Oral  SpO2: 100% 100% 100% 99%  Weight:      Height:        General: Pt is alert, awake, not in acute distress Cardiovascular: RRR, S1/S2 +, no rubs, no gallops Respiratory: CTA bilaterally, no wheezing, no rhonchi Abdominal: Soft, NT, ND, bowel sounds + Extremities: no edema, no cyanosis    The results of significant diagnostics from this hospitalization (including imaging, microbiology, ancillary and laboratory) are listed below for reference.     Microbiology: Recent Results (from the past 240 hour(s))  Blood Culture (routine x 2)     Status: None   Collection Time: 01/22/22 12:17 PM   Specimen: BLOOD  Result Value Ref Range Status   Specimen Description   Final    BLOOD RIGHT ANTECUBITAL Performed at [facility] [address] [place], [place] [address]    Special Requests   Final    BOTTLES DRAWN AEROBIC AND ANAEROBIC Blood Culture adequate volume Performed at [facility] [address], [place], [place] [address]    Culture   Final     NO GROWTH 5 DAYS Performed at [facility], [place] [address], [place], [place] [address]    Report Status 01/27/2022 FINAL  Final  Blood Culture (routine x 2)     Status: None   Collection Time: 01/22/22  5:06 PM   Specimen: BLOOD  Result Value Ref Range Status   Specimen Description   Final    BLOOD SITE NOT SPECIFIED Performed at [facility] [address], [place], [place] [address]    Special Requests   Final    BOTTLES DRAWN AEROBIC ONLY Blood Culture adequate volume Performed at [facility] [address], [place], [place] [address]    Culture   Final    NO GROWTH 5 DAYS Performed at [facility], [place] [address], [place],  [address]    Report Status 01/27/2022 FINAL  Final  Aerobic/Anaerobic Culture w Gram Stain (surgical/deep wound)     Status: None   Collection Time: 01/23/22  10:06 AM   Specimen: PATH Cytology Peritoneal fluid  Result Value Ref Range Status   Specimen Description   Final    PERITONEAL Performed at [facility] [address], [place], [place] [address]    Special Requests   Final    NONE Performed at [facility], [place] [address], [place], [place] [address]    Gram Stain NO WBC SEEN NO ORGANISMS SEEN   Final   Culture   Final    No growth aerobically or anaerobically. Performed at [facility], [place] [address], [place], [place] [address]    Report Status 01/28/2022 FINAL  Final  Aerobic/Anaerobic Culture w Gram Stain (surgical/deep wound)     Status: None   Collection Time: 01/23/22  4:59 PM   Specimen: Abscess  Result Value Ref Range Status   Specimen Description   Final    ABSCESS Performed at [facility] [address], [place], [place] [address]    Special Requests   Final    NONE Performed at [facility], [place] [address], [place], [place] [address]    Gram Stain   Final    ABUNDANT WBC PRESENT,  PREDOMINANTLY PMN NO ORGANISMS SEEN    Culture   Final    MODERATE KLEBSIELLA PNEUMONIAE NO ANAEROBES ISOLATED Performed at [facility], [place] [address], [place], [place] [address]    Report Status 01/28/2022 FINAL  Final   Organism ID, Bacteria KLEBSIELLA PNEUMONIAE  Final      Susceptibility   Klebsiella pneumoniae - MIC*    AMPICILLIN >=32 RESISTANT Resistant     CEFAZOLIN <=4 SENSITIVE Sensitive     CEFEPIME <=0.12 SENSITIVE Sensitive     CEFTAZIDIME <=1 SENSITIVE Sensitive     CEFTRIAXONE <=0.25 SENSITIVE Sensitive     CIPROFLOXACIN <=0.25 SENSITIVE Sensitive     GENTAMICIN <=1 SENSITIVE Sensitive     IMIPENEM <=0.25 SENSITIVE Sensitive     TRIMETH/SULFA <=20 SENSITIVE Sensitive     AMPICILLIN/SULBACTAM 4 SENSITIVE Sensitive     PIP/TAZO <=4 SENSITIVE Sensitive     * MODERATE KLEBSIELLA PNEUMONIAE  Surgical pcr screen     Status: None   Collection Time: 01/25/22  7:46 AM   Specimen: Nasal Mucosa; Nasal Swab  Result Value Ref Range Status   MRSA, PCR NEGATIVE NEGATIVE Final   Staphylococcus aureus NEGATIVE NEGATIVE Final    Comment: (NOTE) The Xpert SA Assay (FDA approved for NASAL specimens in patients 4 years of age and older), is one component of a comprehensive surveillance program. It is not intended to diagnose infection nor to guide or monitor treatment. Performed at [facility], [place] [address] [place], [place] [address]   Aerobic/Anaerobic Culture w Gram Stain (surgical/deep wound)     Status: None (Preliminary result)   Collection Time: 01/25/22  3:16 PM   Specimen: PATH Other; Tissue  Result Value Ref Range Status   Specimen Description   Final    ABSCESS Performed at [facility] [address], [place], [place] [address]    Special Requests   Final    NONE BACK Performed at [facility] [address], [place],  [address]    Gram Stain   Final    NO WBC SEEN NO ORGANISMS  SEEN Performed at [facility], [place] [address] [place],  [address]    Culture   Final    RARE KLEBSIELLA  PNEUMONIAE NO ANAEROBES ISOLATED; CULTURE IN PROGRESS FOR 5 DAYS    Report Status PENDING  Incomplete   Organism ID, Bacteria KLEBSIELLA PNEUMONIAE  Final      Susceptibility   Klebsiella pneumoniae - MIC*    AMPICILLIN >=32 RESISTANT Resistant     CEFAZOLIN <=4 SENSITIVE Sensitive     CEFEPIME <=0.12 SENSITIVE Sensitive     CEFTAZIDIME <=1 SENSITIVE Sensitive     CEFTRIAXONE <=0.25 SENSITIVE Sensitive     CIPROFLOXACIN <=0.25 SENSITIVE Sensitive     GENTAMICIN <=1 SENSITIVE Sensitive     IMIPENEM <=0.25 SENSITIVE Sensitive     TRIMETH/SULFA <=20 SENSITIVE Sensitive     AMPICILLIN/SULBACTAM 4 SENSITIVE Sensitive     PIP/TAZO <=4 SENSITIVE Sensitive     * RARE KLEBSIELLA PNEUMONIAE     Labs: BNP (last 3 results) Recent Labs    01/22/22 1217  BNP 28.2   Basic Metabolic Panel: Recent Labs  Lab 01/24/22 0452 01/25/22 0509 01/26/22 0451 01/27/22 0523 01/28/22 0516 01/29/22 0424  NA 131* 134* 135 136 133* 135  K 4.5 3.7 5.1 3.9 4.4 3.9  CL 100 104 105 106 101 103  CO2 24 21* $Remo'27 26 26 25  'iMPgs$ GLUCOSE 233* 168* 263* 167* 226* 118*  BUN $Re'17 16 17 16 18 16  'jXR$ CREATININE 1.08* 1.13* 1.15* 1.05* 1.09* 1.03*  CALCIUM 8.1* 8.5* 8.6* 8.4* 8.2* 8.3*  MG 1.5* 1.8  --   --   --   --   PHOS 3.7  --   --   --   --   --    Liver Function Tests: Recent Labs  Lab 01/25/22 0509 01/26/22 0451 01/27/22 0523 01/28/22 0516 01/29/22 0424  AST $Re'24 16 17 'xgz$ 200* 81*  ALT $Re'10 10 11 'pjZ$ 85* 60*  ALKPHOS 121 93 83 395* 317*  BILITOT 0.5 0.5 0.3 0.6 0.5  PROT 6.3* 5.5* 5.7* 5.3* 5.8*  ALBUMIN 2.6* 2.2* 2.2* 2.2* 2.4*   Recent Labs  Lab 01/22/22 1217 01/28/22 0516  LIPASE 18 37   No results for input(s): "AMMONIA" in the last 168 hours. CBC: Recent Labs  Lab 01/22/22 1217 01/23/22 0508 01/24/22 0452 01/25/22 0509 01/26/22 0451 01/27/22 0523 01/28/22 0516  WBC 15.7*    < > 9.6 12.0* 4.5 7.8 6.5  NEUTROABS 13.6*  --   --   --   --  5.3 5.2  HGB 10.0*   < > 9.2* 11.3* 9.7* 9.6* 9.3*  HCT 30.7*   < > 28.2* 36.0 29.5* 29.7* 28.5*  MCV 87.7   < > 87.9 91.4 87.3 88.1 87.7  PLT 261   < > 231 353 222 247 211   < > = values in this interval not displayed.   Cardiac Enzymes: No results for input(s): "CKTOTAL", "CKMB", "CKMBINDEX", "TROPONINI" in the last 168 hours. BNP: Invalid input(s): "POCBNP" CBG: Recent Labs  Lab 01/28/22 0734 01/28/22 1129 01/28/22 1633 01/28/22 2103 01/29/22 0733  GLUCAP 173* 185* 231* 306* 76   D-Dimer No results for input(s): "DDIMER" in the last 72 hours. Hgb A1c No results for input(s): "HGBA1C" in the last 72 hours. Lipid Profile No results for input(s): "CHOL", "HDL", "LDLCALC", "TRIG", "CHOLHDL", "LDLDIRECT" in the last 72 hours. Thyroid function studies No results for input(s): "TSH", "T4TOTAL", "T3FREE", "THYROIDAB" in the last 72 hours.  Invalid input(s): "FREET3" Anemia work up No results for input(s): "VITAMINB12", "FOLATE", "FERRITIN", "TIBC", "IRON", "RETICCTPCT" in the last 72 hours. Urinalysis    Component Value Date/Time   COLORURINE  YELLOW 01/22/2022 [place] 01/22/2022 [place] 04/07/2013 2105   LABSPEC 1.016 01/22/2022 1424   LABSPEC 1.020 04/07/2013 2105   PHURINE 6.0 01/22/2022 1424   GLUCOSEU NEGATIVE 01/22/2022 1424   GLUCOSEU 150 mg/dL 04/07/2013 2105   HGBUR SMALL (A) 01/22/2022 1424   HGBUR negative 11/03/2008 0822   BILIRUBINUR NEGATIVE 01/22/2022 1424   BILIRUBINUR Negative 04/07/2013 2105   KETONESUR NEGATIVE 01/22/2022 1424   PROTEINUR NEGATIVE 01/22/2022 1424   UROBILINOGEN 0.2 11/03/2008 0822   NITRITE NEGATIVE 01/22/2022 1424   LEUKOCYTESUR NEGATIVE 01/22/2022 1424   LEUKOCYTESUR Negative 04/07/2013 2105   Sepsis Labs Recent Labs  Lab 01/25/22 0509 01/26/22 0451 01/27/22 0523 01/28/22 0516  WBC 12.0* 4.5 7.8 6.5   Microbiology Recent  Results (from the past 240 hour(s))  Blood Culture (routine x 2)     Status: None   Collection Time: 01/22/22 12:17 PM   Specimen: BLOOD  Result Value Ref Range Status   Specimen Description   Final    BLOOD RIGHT ANTECUBITAL Performed at [facility], [place] [address], [place], [place] [address]    Special Requests   Final    BOTTLES DRAWN AEROBIC AND ANAEROBIC Blood Culture adequate volume Performed at [facility] [address], [place], [place] [address]    Culture   Final    NO GROWTH 5 DAYS Performed at [facility], [place] [address] [place], [place] [address]    Report Status 01/27/2022 FINAL  Final  Blood Culture (routine x 2)     Status: None   Collection Time: 01/22/22  5:06 PM   Specimen: BLOOD  Result Value Ref Range Status   Specimen Description   Final    BLOOD SITE NOT SPECIFIED Performed at [facility] [address], [place], [place] [address]    Special Requests   Final    BOTTLES DRAWN AEROBIC ONLY Blood Culture adequate volume Performed at [facility] [address] [place], [place] [address]    Culture   Final    NO GROWTH 5 DAYS Performed at [facility], [place] [address] [place], [place] [address]    Report Status 01/27/2022 FINAL  Final  Aerobic/Anaerobic Culture w Gram Stain (surgical/deep wound)     Status: None   Collection Time: 01/23/22 10:06 AM   Specimen: PATH Cytology Peritoneal fluid  Result Value Ref Range Status   Specimen Description   Final    PERITONEAL Performed at [facility] [address] [place], [place] [address]    Special Requests   Final    NONE Performed at [facility], [place] [address] [place], [place] [address]    Gram Stain NO WBC SEEN NO ORGANISMS SEEN   Final   Culture   Final    No growth aerobically or anaerobically. Performed at [facility], [place] [address]  [place], [place] [address]    Report Status 01/28/2022 FINAL  Final  Aerobic/Anaerobic Culture w Gram Stain (surgical/deep wound)     Status: None   Collection Time: 01/23/22  4:59 PM   Specimen: Abscess  Result Value Ref Range Status   Specimen Description   Final    ABSCESS Performed at [facility] [address], [place], [place] [address]    Special Requests   Final    NONE Performed at [facility], [address] [place], [place] [address]    Gram Stain   Final    ABUNDANT WBC PRESENT, PREDOMINANTLY PMN NO ORGANISMS SEEN    Culture   Final    MODERATE KLEBSIELLA PNEUMONIAE NO ANAEROBES ISOLATED Performed at [facility], [place] [address], [place], [place] [address]    Report Status 01/28/2022 FINAL  Final   Organism ID, Bacteria KLEBSIELLA PNEUMONIAE  Final      Susceptibility   Klebsiella pneumoniae - MIC*    AMPICILLIN >=32 RESISTANT Resistant     CEFAZOLIN <=4 SENSITIVE Sensitive     CEFEPIME <=0.12 SENSITIVE Sensitive     CEFTAZIDIME <=1 SENSITIVE Sensitive     CEFTRIAXONE <=0.25 SENSITIVE Sensitive     CIPROFLOXACIN <=0.25 SENSITIVE Sensitive     GENTAMICIN <=1 SENSITIVE Sensitive     IMIPENEM <=0.25 SENSITIVE Sensitive     TRIMETH/SULFA <=20 SENSITIVE Sensitive     AMPICILLIN/SULBACTAM 4 SENSITIVE Sensitive     PIP/TAZO <=4 SENSITIVE Sensitive     * MODERATE KLEBSIELLA PNEUMONIAE  Surgical pcr screen     Status: None   Collection Time: 01/25/22  7:46 AM   Specimen: Nasal Mucosa; Nasal Swab  Result Value Ref Range Status   MRSA, PCR NEGATIVE NEGATIVE Final   Staphylococcus aureus NEGATIVE NEGATIVE Final    Comment: (NOTE) The Xpert SA Assay (FDA approved for NASAL specimens in patients 36 years of age and older), is one component of a comprehensive surveillance program. It is not intended to diagnose infection nor to guide or monitor treatment. Performed at [facility], [place] [address], [place], [place] [address]   Aerobic/Anaerobic Culture w Gram Stain (surgical/deep wound)     Status: None (Preliminary result)   Collection Time: 01/25/22  3:16 PM   Specimen: PATH Other; Tissue  Result Value Ref Range Status   Specimen Description   Final    ABSCESS Performed at [facility] [address] [place], [place] [address]    Special Requests   Final    NONE BACK Performed at [facility] [address] [place], [place] [address]    Gram Stain   Final    NO WBC SEEN NO ORGANISMS SEEN Performed at [facility], [place] [address], [place],  [address]    Culture   Final    RARE KLEBSIELLA PNEUMONIAE NO ANAEROBES ISOLATED; CULTURE IN PROGRESS FOR 5 DAYS    Report Status PENDING  Incomplete   Organism ID, Bacteria KLEBSIELLA PNEUMONIAE  Final      Susceptibility   Klebsiella pneumoniae - MIC*    AMPICILLIN >=32 RESISTANT Resistant     CEFAZOLIN <=4 SENSITIVE Sensitive     CEFEPIME <=0.12 SENSITIVE Sensitive     CEFTAZIDIME <=1 SENSITIVE Sensitive     CEFTRIAXONE <=0.25 SENSITIVE Sensitive     CIPROFLOXACIN <=0.25 SENSITIVE Sensitive     GENTAMICIN <=1 SENSITIVE Sensitive     IMIPENEM <=0.25 SENSITIVE Sensitive     TRIMETH/SULFA <=20 SENSITIVE Sensitive     AMPICILLIN/SULBACTAM 4 SENSITIVE Sensitive     PIP/TAZO <=4 SENSITIVE Sensitive     * RARE KLEBSIELLA PNEUMONIAE    Please note: You were cared for by a hospitalist during your hospital stay. Once you are discharged, your primary care physician will handle any further medical issues. Please note that NO REFILLS for any discharge medications will be authorized once you are discharged, as it is imperative that you return to your primary care physician (  or establish a relationship with a primary care physician if you do not have one) for your post hospital discharge needs so that they can reassess your need for medications and monitor your lab values.    Time  coordinating discharge: 40 minutes  SIGNED:   Shelly Coss, MD  [facility] 01/29/2022, 10:17 AM Pager [phone]  If 7PM-7AM, please contact night-coverage [email or web address] Password [email or web address]

## 2022-01-29 NOTE — Inpatient Diabetes Management (Signed)
Inpatient Diabetes Program Recommendations  AACE/ADA: New Consensus Statement on Inpatient Glycemic Control (2015)  Target Ranges:  Prepandial:   less than 140 mg/dL      Peak postprandial:   less than 180 mg/dL (1-2 hours)      Critically ill patients:  140 - 180 mg/dL   Lab Results  Component Value Date   GLUCAP 76 01/29/2022   HGBA1C 6.0 (A) 01/17/2022    Review of Glycemic Control   Latest Reference Range & Units 01/28/22 07:34 01/28/22 11:29 01/28/22 16:33 01/28/22 21:03 01/29/22 07:33  Glucose-Capillary 70 - 99 mg/dL 173 (H) 185 (H) 231 (H) 306 (H) 76  (H): Data is abnormally high  Current orders for Inpatient glycemic control:  Novolog 0-9 units TID and 0-5 units QHS, Semglee 15 units QD  Inpatient Diabetes Program Recommendations:    Novolog 3 units TID with meals if consumes at least 50%.  Will continue to follow while inpatient.  Thank you, Reche Dixon, MSN, [facility] Diabetes Coordinator Inpatient Diabetes Program [phone] (team pager from 8a-5p)

## 2022-01-29 NOTE — Progress Notes (Signed)
Daily Progress Note  Hospital Day: 8  Chief Complaint: pancreatic pseudocyst   Brief History Mallory Stewart is a 61 y.o. female with a pmh not limited to Etoh cirrhosis with portal HTN, chronic etoh pancreatitis, C-diff. Recent admission for peripancreatic / retroperitoneal infection pseudocyst / abscess aspirated by IR. Readmitted several days ago with sepsis, cellulitis around area of previous aspiration   Assessment / Plan   # Chronic pancreatitis with peripancreatic and retroperitoneal infected pseudocyst and abscess / cellulitis at site of previous aspiration. She is s/p I+D and penrose drain placement by General Surgery. Underwent ERCP with stent placement on 9/23. There was a single biliary narrowing in the lower third of the main bile duct. The stricture was indeterminate, it was brushed. Unable to access the more proximal pancreatic duct to  evaluate whether a diffuse tail disruption was present. Await cytology Culture + for Klebsiella pneumoniae .  ID following, on Augmentin She will follow up with General Surgery in a few weeks Will arrange for our office to obtain pancreas protocol CT abdomen/pelvis in 3 weeks to reevaluate pancreatic fluid collections/infected pseudocyst.  Follow up in office with me 10/24 at 10 am.    # Elevated liver chemistries. Likely secondary to biliary manipulation during ERCP.  Improving.   # Decompensated Etoh cirrhosis with ascites. MELD 16 Continue lasix 20 mg daily Continue Spironolactone , dosing will be escalated to 50 mg daily  Low sodium diet Follow up with [facility] for decompensated cirrhosis.  Echo done to evaluate right heart in case TIPS pursued at some point. See full report below. Right ventricular systolic function is normal. The right ventricular size is normal. There is normal pulmonary artery systolic pressure. BMET in our office in a week   Subjective   Up in chair. Feels okay.   Objective   Endoscopic  studies:   ERCP - The major papilla appeared normal. - The entire main bile duct was moderately dilated. - A single biliary narrowing was found in the lower third of the main bile duct. The stricture was indeterminate. This was brushed. - Difficult for deep wire cannulation/selection of the pancreatic duct due to what seems to be a ventral head pancreatic duct stricture. - A pancreatic sphincterotomy was performed. - A biliary sphincterotomy was performed. - The biliary tree was swept and small amount of sludge was found. - One temporary plastic pancreatic stent was placed into the ventral pancreatic duct.  Imaging:  ECHOCARDIOGRAM COMPLETE  Result Date: 01/28/2022    ECHOCARDIOGRAM REPORT   Patient Name:   Mallory Stewart Date of Exam: 01/28/2022 Medical Rec #:  [id number]          Height:       69.0 in Accession #:    [id number]         Weight:       155.2 lb Date of Birth:  10-16-1960          BSA:          1.855 m Patient Age:    41 years           BP:           108/84 mmHg Patient Gender: F                  HR:           68 bpm. Exam Location:  Inpatient Procedure: 2D Echo Indications:    History of ascites  History:  Patient has prior history of Echocardiogram examinations, most                 recent 08/14/2018. Risk Factors:Diabetes and Hypertension.  Sonographer:    Cathie Hoops Referring Phys: [id number] Corliss Parish JR  Sonographer Comments: Supine. IMPRESSIONS  1. Left ventricular ejection fraction, by estimation, is 55 to 60%. The left ventricle has normal function. The left ventricle has no regional wall motion abnormalities. Left ventricular diastolic parameters were normal.  2. Right ventricular systolic function is normal. The right ventricular size is normal. There is normal pulmonary artery systolic pressure.  3. Left atrial size was mildly dilated.  4. The mitral valve is abnormal. Mild mitral valve regurgitation. No evidence of mitral stenosis.  5. The aortic valve was not  well visualized. There is moderate calcification of the aortic valve. There is moderate thickening of the aortic valve. Aortic valve regurgitation is not visualized. Aortic valve sclerosis/calcification is present, without any evidence of aortic stenosis.  6. The inferior vena cava is normal in size with greater than 50% respiratory variability, suggesting right atrial pressure of 3 mmHg. FINDINGS  Left Ventricle: Left ventricular ejection fraction, by estimation, is 55 to 60%. The left ventricle has normal function. The left ventricle has no regional wall motion abnormalities. The left ventricular internal cavity size was normal in size. There is  no left ventricular hypertrophy. Left ventricular diastolic parameters were normal. Right Ventricle: The right ventricular size is normal. No increase in right ventricular wall thickness. Right ventricular systolic function is normal. There is normal pulmonary artery systolic pressure. The tricuspid regurgitant velocity is 2.09 m/s, and  with an assumed right atrial pressure of 3 mmHg, the estimated right ventricular systolic pressure is 20.5 mmHg. Left [facility]: Left atrial size was mildly dilated. Right [facility]: Right atrial size was normal in size. Pericardium: There is no evidence of pericardial effusion. Mitral Valve: The mitral valve is abnormal. There is mild thickening of the mitral valve leaflet(s). There is mild calcification of the mitral valve leaflet(s). Mild mitral annular calcification. Mild mitral valve regurgitation. No evidence of mitral valve stenosis. Tricuspid Valve: The tricuspid valve is normal in structure. Tricuspid valve regurgitation is mild . No evidence of tricuspid stenosis. Aortic Valve: The aortic valve was not well visualized. There is moderate calcification of the aortic valve. There is moderate thickening of the aortic valve. Aortic valve regurgitation is not visualized. Aortic valve sclerosis/calcification is present, without any evidence  of aortic stenosis. Aortic valve mean gradient measures 5.0 mmHg. Aortic valve peak gradient measures 9.6 mmHg. Aortic valve area, by VTI measures 2.05 cm. Pulmonic Valve: The pulmonic valve was normal in structure. Pulmonic valve regurgitation is trivial. No evidence of pulmonic stenosis. Aorta: The aortic root is normal in size and structure. Venous: The inferior vena cava is normal in size with greater than 50% respiratory variability, suggesting right atrial pressure of 3 mmHg. IAS/Shunts: No atrial level shunt detected by color flow Doppler.  LEFT VENTRICLE PLAX 2D LVIDd:         4.75 cm     Diastology LVIDs:         2.90 cm     LV e' medial:    9.79 cm/s LV PW:         0.80 cm     LV E/e' medial:  7.8 LV IVS:        0.80 cm     LV e' lateral:   13.80 cm/s LVOT diam:  1.90 cm     LV E/e' lateral: 5.5 LV SV:         69 LV SV Index:   37 LVOT Area:     2.84 cm  LV Volumes (MOD) LV vol d, MOD A2C: 79.3 ml LV vol d, MOD A4C: 83.1 ml LV vol s, MOD A2C: 34.1 ml LV vol s, MOD A4C: 32.0 ml LV SV MOD A2C:     45.2 ml LV SV MOD A4C:     83.1 ml LV SV MOD BP:      48.6 ml RIGHT VENTRICLE RV Basal diam:  3.50 cm RV Mid diam:    2.90 cm RV S prime:     20.90 cm/s TAPSE (M-mode): 2.1 cm LEFT ATRIUM             Index        RIGHT ATRIUM           Index LA diam:        4.10 cm 2.21 cm/m   RA Area:     11.00 cm LA Vol (A2C):   46.2 ml 24.91 ml/m  RA Volume:   22.00 ml  11.86 ml/m LA Vol (A4C):   53.4 ml 28.79 ml/m LA Biplane Vol: 54.3 ml 29.27 ml/m  AORTIC VALVE                     PULMONIC VALVE AV Area (Vmax):    1.96 cm      PV Vmax:          0.87 m/s AV Area (Vmean):   2.00 cm      PV Peak grad:     3.0 mmHg AV Area (VTI):     2.05 cm      PR End Diast Vel: 3.21 msec AV Vmax:           155.00 cm/s AV Vmean:          104.000 cm/s AV VTI:            0.338 m AV Peak Grad:      9.6 mmHg AV Mean Grad:      5.0 mmHg LVOT Vmax:         107.00 cm/s LVOT Vmean:        73.300 cm/s LVOT VTI:          0.244 m LVOT/AV VTI  ratio: 0.72  AORTA Ao Root diam: 3.20 cm Ao Asc diam:  3.30 cm MITRAL VALVE               TRICUSPID VALVE MV Area (PHT): 3.31 cm    TR Peak grad:   17.5 mmHg MV Decel Time: 229 msec    TR Vmax:        209.00 cm/s MR Peak grad: 67.9 mmHg MR Vmax:      412.00 cm/s  SHUNTS MV E velocity: 75.90 cm/s  Systemic VTI:  0.24 m MV A velocity: 59.70 cm/s  Systemic Diam: 1.90 cm MV E/A ratio:  1.27 Charlton Haws MD Electronically signed by Charlton Haws MD Signature Date/Time: 01/28/2022/3:25:36 PM    Final    DG ERCP  Result Date: 01/27/2022 CLINICAL DATA:  Biliary obstruction EXAM: ERCP TECHNIQUE: Multiple spot images obtained with the fluoroscopic device and submitted for interpretation post-procedure. FLUOROSCOPY: Radiation Exposure Index (as provided by the fluoroscopic device): 93.79 mGy Kerma COMPARISON:  MRCP 01/25/2022 FINDINGS: A total of 11 intraoperative spot images are submitted for review. The images  demonstrate a flexible duodenal scope in the descending duodenum followed by wire cannulation of the pancreatic duct and common bile duct. On the final images, a plastic stent has been placed in the pancreatic duct. IMPRESSION: ERCP with placement of a plastic pancreatic stent. These images were submitted for radiologic interpretation only. Please see the procedural report for the amount of contrast and the fluoroscopy time utilized. Electronically Signed   By: Malachy MoanHeath  McCullough M.D.   On: 01/27/2022 14:51    Lab Results: Recent Labs    01/27/22 0523 01/28/22 0516  WBC 7.8 6.5  HGB 9.6* 9.3*  HCT 29.7* 28.5*  PLT 247 211   BMET Recent Labs    01/27/22 0523 01/28/22 0516 01/29/22 0424  NA 136 133* 135  K 3.9 4.4 3.9  CL 106 101 103  CO2 26 26 25   GLUCOSE 167* 226* 118*  BUN 16 18 16   CREATININE 1.05* 1.09* 1.03*  CALCIUM 8.4* 8.2* 8.3*   LFT Recent Labs    01/29/22 0424  PROT 5.8*  ALBUMIN 2.4*  AST 81*  ALT 60*  ALKPHOS 317*  BILITOT 0.5   PT/INR Recent Labs    01/27/22 0523   LABPROT 15.7*  INR 1.3*     Scheduled inpatient medications:   amoxicillin-clavulanate  1 tablet Oral Q12H   aspirin EC  81 mg Oral Daily   furosemide  20 mg Oral QODAY   insulin aspart  0-5 Units Subcutaneous QHS   insulin aspart  0-9 Units Subcutaneous TID WC   insulin glargine-yfgn  15 Units Subcutaneous Daily   pantoprazole  40 mg Oral BID   spironolactone  50 mg Oral Daily   vancomycin  125 mg Oral BID   Continuous inpatient infusions:  PRN inpatient medications: diphenhydrAMINE, methocarbamol, morphine injection, prochlorperazine, traMADol  Vital signs in last 24 hours: Temp:  [97.6 F (36.4 C)-98.3 F (36.8 C)] 97.6 F (36.4 C) (09/25 0433) Pulse Rate:  [59-68] 59 (09/25 0433) Resp:  [18-19] 18 (09/25 0433) BP: (131-153)/(76-96) 136/84 (09/25 0433) SpO2:  [99 %-100 %] 99 % (09/25 0433) Last BM Date : 01/28/22  Intake/Output Summary (Last 24 hours) at 01/29/2022 0941 Last data filed at 01/29/2022 0758 Gross per 24 hour  Intake 1245.11 ml  Output --  Net 1245.11 ml    Intake/Output from previous day: 09/24 0701 - 09/25 0700 In: 1125.1 [P.O.:720; IV Piggyback:405.1] Out: -  Intake/Output this shift: Total I/O In: 120 [P.O.:120] Out: -    Physical Exam:  General: Alert female in NAD Heart:  Regular rate and rhythm. No lower extremity edema Pulmonary: Normal respiratory effort Abdomen: Soft, mild- moderately distended,  nontender. Normal bowel sounds.  Back: dressing in place. No significant swelling Neurologic: Alert and oriented Psych: Pleasant. Cooperative.    Principal Problem:   Sepsis (HCC) Active Problems:   HTN (hypertension)   Pancreatic abscess   Abnormal CT of the abdomen   Chronic hyponatremia   Type 2 diabetes mellitus with hyperglycemia, with long-term current use of insulin (HCC)   Pancreatic pseudocyst   Ascites   Cellulitis   Allergic reaction   Acute pancreatic fluid collection   Cellulitis of back   Infected pseudocyst of  pancreas   Cellulitis and abscess of trunk   Infected pancreatic pseudocyst   Leakage from tail of pancreas   Medication monitoring encounter   Abdominal wall abscess   Atrophic pancreas   Alcohol-induced chronic pancreatitis (HCC)   Pancreatic duct leak   Decompensated hepatic cirrhosis (HCC)  Abnormal LFTs   Pancreatic duct stricture   Biliary stricture   Clostridioides difficile infection     LOS: 7 days   Willette Cluster ,NP 01/29/2022, 9:41 AM

## 2022-01-29 NOTE — Telephone Encounter (Signed)
Pharmacy Patient Advocate Encounter  Insurance verification completed.    The patient is insured through [employer]   The patient is currently admitted and ran test claims for the following: amoxicillin-clavulanate (Augmentin) , Vancomycin.  Copays and coinsurance results were relayed to Inpatient clinical team.

## 2022-01-29 NOTE — Telephone Encounter (Signed)
KUB order entered and pt aware via My Chart. Pt is currently admitted

## 2022-01-29 NOTE — TOC Benefit Eligibility Note (Signed)
Patient Teacher, English as a foreign language completed.    The patient is currently admitted and upon discharge could be taking Vancomycin 125 mg capsules.  The current 30 day co-pay is $0.00.   The patient is currently admitted and upon discharge could be taking amoxicillin-clavulanate (Augmentin) 875-125 mg tablets.  The current 30 day co-pay is $0.00.   The patient is insured through [employer], [employer] Patient Advocate Specialist [place] Patient Advocate Team Direct Number: [phone]  Fax: [phone]

## 2022-01-29 NOTE — Discharge Instructions (Signed)
Dressing: change outer dressing daily and as needed.  Wash with soap and water.  Ok to shower.

## 2022-01-29 NOTE — Telephone Encounter (Signed)
-----   Message from Irving Copas., MD sent at 01/27/2022 12:52 PM EDT ----- Regarding: Pancreatic stent follow-up Tripton Ned or covering RN, This patient needs a KUB in 2 weeks (2 view) for pancreas stent follow-up. You can place that under my name since I am not sure when Dr. Carlean Purl will return. Thanks. GM

## 2022-01-29 NOTE — Progress Notes (Signed)
2 Days Post-Op   Subjective/Chief Complaint:  tol diet, no ab pain   Objective: Vital signs in last 24 hours: Temp:  [97.6 F (36.4 C)-98.3 F (36.8 C)] 97.6 F (36.4 C) (09/25 0433) Pulse Rate:  [59-68] 59 (09/25 0433) Resp:  [18-19] 18 (09/25 0433) BP: (131-153)/(76-96) 136/84 (09/25 0433) SpO2:  [99 %-100 %] 99 % (09/25 0433) Last BM Date : 01/28/22  Intake/Output from previous day: 09/24 0701 - 09/25 0700 In: 1125.1 [P.O.:720; IV Piggyback:405.1] Out: -  Intake/Output this shift: Total I/O In: 120 [P.O.:120] Out: -   Back without cellulitis, minimal drainage, no infection noted., penrose in place  Lab Results:  Recent Labs    01/27/22 0523 01/28/22 0516  WBC 7.8 6.5  HGB 9.6* 9.3*  HCT 29.7* 28.5*  PLT 247 211    BMET Recent Labs    01/28/22 0516 01/29/22 0424  NA 133* 135  K 4.4 3.9  CL 101 103  CO2 26 25  GLUCOSE 226* 118*  BUN 18 16  CREATININE 1.09* 1.03*  CALCIUM 8.2* 8.3*    PT/INR Recent Labs    01/27/22 0523  LABPROT 15.7*  INR 1.3*    ABG No results for input(s): "PHART", "HCO3" in the last 72 hours.  Invalid input(s): "PCO2", "PO2"  Studies/Results: ECHOCARDIOGRAM COMPLETE  Result Date: 01/28/2022    ECHOCARDIOGRAM REPORT   Patient Name:   Mallory Stewart Date of Exam: 01/28/2022 Medical Rec #:  [id number]          Height:       69.0 in Accession #:    [id number]         Weight:       155.2 lb Date of Birth:  [birth year]          BSA:          1.855 m Patient Age:    61 years           BP:           108/84 mmHg Patient Gender: F                  HR:           68 bpm. Exam Location:  Inpatient Procedure: 2D Echo Indications:    History of ascites  History:        Patient has prior history of Echocardiogram examinations, most                 recent 08/14/2018. Risk Factors:Diabetes and Hypertension.  Sonographer:    Harvie Junior Referring Phys: [id number] [place]  Sonographer Comments: Supine. IMPRESSIONS  1. Left ventricular  ejection fraction, by estimation, is 55 to 60%. The left ventricle has normal function. The left ventricle has no regional wall motion abnormalities. Left ventricular diastolic parameters were normal.  2. Right ventricular systolic function is normal. The right ventricular size is normal. There is normal pulmonary artery systolic pressure.  3. Left atrial size was mildly dilated.  4. The mitral valve is abnormal. Mild mitral valve regurgitation. No evidence of mitral stenosis.  5. The aortic valve was not well visualized. There is moderate calcification of the aortic valve. There is moderate thickening of the aortic valve. Aortic valve regurgitation is not visualized. Aortic valve sclerosis/calcification is present, without any evidence of aortic stenosis.  6. The inferior vena cava is normal in size with greater than 50% respiratory variability, suggesting right atrial pressure of 3 mmHg. FINDINGS  Left  Ventricle: Left ventricular ejection fraction, by estimation, is 55 to 60%. The left ventricle has normal function. The left ventricle has no regional wall motion abnormalities. The left ventricular internal cavity size was normal in size. There is  no left ventricular hypertrophy. Left ventricular diastolic parameters were normal. Right Ventricle: The right ventricular size is normal. No increase in right ventricular wall thickness. Right ventricular systolic function is normal. There is normal pulmonary artery systolic pressure. The tricuspid regurgitant velocity is 2.09 m/s, and  with an assumed right atrial pressure of 3 mmHg, the estimated right ventricular systolic pressure is 29.9 mmHg. Left Atrium: Left atrial size was mildly dilated. Right Atrium: Right atrial size was normal in size. Pericardium: There is no evidence of pericardial effusion. Mitral Valve: The mitral valve is abnormal. There is mild thickening of the mitral valve leaflet(s). There is mild calcification of the mitral valve leaflet(s). Mild  mitral annular calcification. Mild mitral valve regurgitation. No evidence of mitral valve stenosis. Tricuspid Valve: The tricuspid valve is normal in structure. Tricuspid valve regurgitation is mild . No evidence of tricuspid stenosis. Aortic Valve: The aortic valve was not well visualized. There is moderate calcification of the aortic valve. There is moderate thickening of the aortic valve. Aortic valve regurgitation is not visualized. Aortic valve sclerosis/calcification is present, without any evidence of aortic stenosis. Aortic valve mean gradient measures 5.0 mmHg. Aortic valve peak gradient measures 9.6 mmHg. Aortic valve area, by VTI measures 2.05 cm. Pulmonic Valve: The pulmonic valve was normal in structure. Pulmonic valve regurgitation is trivial. No evidence of pulmonic stenosis. Aorta: The aortic root is normal in size and structure. Venous: The inferior vena cava is normal in size with greater than 50% respiratory variability, suggesting right atrial pressure of 3 mmHg. IAS/Shunts: No atrial level shunt detected by color flow Doppler.  LEFT VENTRICLE PLAX 2D LVIDd:         4.75 cm     Diastology LVIDs:         2.90 cm     LV e' medial:    9.79 cm/s LV PW:         0.80 cm     LV E/e' medial:  7.8 LV IVS:        0.80 cm     LV e' lateral:   13.80 cm/s LVOT diam:     1.90 cm     LV E/e' lateral: 5.5 LV SV:         69 LV SV Index:   37 LVOT Area:     2.84 cm  LV Volumes (MOD) LV vol d, MOD A2C: 79.3 ml LV vol d, MOD A4C: 83.1 ml LV vol s, MOD A2C: 34.1 ml LV vol s, MOD A4C: 32.0 ml LV SV MOD A2C:     45.2 ml LV SV MOD A4C:     83.1 ml LV SV MOD BP:      48.6 ml RIGHT VENTRICLE RV Basal diam:  3.50 cm RV Mid diam:    2.90 cm RV S prime:     20.90 cm/s TAPSE (M-mode): 2.1 cm LEFT ATRIUM             Index        RIGHT ATRIUM           Index LA diam:        4.10 cm 2.21 cm/m   RA Area:     11.00 cm LA Vol (A2C):   46.2 ml 24.[place]  ml/m  RA Volume:   22.00 ml  11.86 ml/m LA Vol (A4C):   53.4 ml 28.79 ml/m  LA Biplane Vol: 54.3 ml 29.27 ml/m  AORTIC VALVE                     PULMONIC VALVE AV Area (Vmax):    1.96 cm      PV Vmax:          0.87 m/s AV Area (Vmean):   2.00 cm      PV Peak grad:     3.0 mmHg AV Area (VTI):     2.05 cm      PR End Diast Vel: 3.21 msec AV Vmax:           155.00 cm/s AV Vmean:          104.000 cm/s AV VTI:            0.338 m AV Peak Grad:      9.6 mmHg AV Mean Grad:      5.0 mmHg LVOT Vmax:         107.00 cm/s LVOT Vmean:        73.300 cm/s LVOT VTI:          0.244 m LVOT/AV VTI ratio: 0.72  AORTA Ao Root diam: 3.20 cm Ao Asc diam:  3.30 cm MITRAL VALVE               TRICUSPID VALVE MV Area (PHT): 3.31 cm    TR Peak grad:   17.5 mmHg MV Decel Time: 229 msec    TR Vmax:        209.00 cm/s MR Peak grad: 67.9 mmHg MR Vmax:      412.00 cm/s  SHUNTS MV E velocity: 75.90 cm/s  Systemic VTI:  0.24 m MV A velocity: 59.70 cm/s  Systemic Diam: 1.90 cm MV E/A ratio:  1.27 Jenkins Rouge MD Electronically signed by Jenkins Rouge MD Signature Date/Time: 01/28/2022/3:25:36 PM    Final    DG ERCP  Result Date: 01/27/2022 CLINICAL DATA:  Biliary obstruction EXAM: ERCP TECHNIQUE: Multiple spot images obtained with the fluoroscopic device and submitted for interpretation post-procedure. FLUOROSCOPY: Radiation Exposure Index (as provided by the fluoroscopic device): 93.79 mGy Kerma COMPARISON:  MRCP 01/25/2022 FINDINGS: A total of 11 intraoperative spot images are submitted for review. The images demonstrate a flexible duodenal scope in the descending duodenum followed by wire cannulation of the pancreatic duct and common bile duct. On the final images, a plastic stent has been placed in the pancreatic duct. IMPRESSION: ERCP with placement of a plastic pancreatic stent. These images were submitted for radiologic interpretation only. Please see the procedural report for the amount of contrast and the fluoroscopy time utilized. Electronically Signed   By: Jacqulynn Cadet M.D.   On: 01/27/2022 14:51     Anti-infectives: Anti-infectives (From admission, onward)    Start     Dose/Rate Route Frequency Ordered Stop   01/29/22 1200  amoxicillin-clavulanate (AUGMENTIN) 875-125 MG per tablet 1 tablet        1 tablet Oral Every 12 hours 01/29/22 0928     01/24/22 1400  vancomycin (VANCOREADY) IVPB 1250 mg/250 mL  Status:  Discontinued        1,250 mg 166.7 mL/hr over 90 Minutes Intravenous Every 24 hours 01/24/22 0819 01/26/22 0858   01/24/22 1400  vancomycin (VANCOCIN) capsule 125 mg        125 mg Oral 2 times daily  01/24/22 1311     01/23/22 1400  vancomycin (VANCOREADY) IVPB 1500 mg/300 mL  Status:  Discontinued        1,500 mg 150 mL/hr over 120 Minutes Intravenous Every 24 hours 01/22/22 1604 01/24/22 0819   01/22/22 1800  Ampicillin-Sulbactam (UNASYN) 3 g in sodium chloride 0.9 % 100 mL IVPB  Status:  Discontinued        3 g 200 mL/hr over 30 Minutes Intravenous Every 6 hours 01/22/22 1714 01/29/22 0928   01/22/22 1345  cefTRIAXone (ROCEPHIN) 2 g in sodium chloride 0.9 % 100 mL IVPB        2 g 200 mL/hr over 30 Minutes Intravenous  Once 01/22/22 1342 01/22/22 1430   01/22/22 1345  vancomycin (VANCOREADY) IVPB 1250 mg/250 mL        1,250 mg 166.7 mL/hr over 90 Minutes Intravenous  Once 01/22/22 1342 01/23/22 0435       Assessment/Plan: POD 4 I/d back abscess with penrose -change dressing daily and as needed -may shower -dc when medically able.   Rosario Adie [id number]

## 2022-01-29 NOTE — Progress Notes (Signed)
Subjective: No new complaints   Antibiotics:  Anti-infectives (From admission, onward)    Start     Dose/Rate Route Frequency Ordered Stop   01/29/22 1200  amoxicillin-clavulanate (AUGMENTIN) 875-125 MG per tablet 1 tablet        1 tablet Oral Every 12 hours 01/29/22 0928     01/24/22 1400  vancomycin (VANCOREADY) IVPB 1250 mg/250 mL  Status:  Discontinued        1,250 mg 166.7 mL/hr over 90 Minutes Intravenous Every 24 hours 01/24/22 0819 01/26/22 0858   01/24/22 1400  vancomycin (VANCOCIN) capsule 125 mg        125 mg Oral 2 times daily 01/24/22 1311     01/23/22 1400  vancomycin (VANCOREADY) IVPB 1500 mg/300 mL  Status:  Discontinued        1,500 mg 150 mL/hr over 120 Minutes Intravenous Every 24 hours 01/22/22 1604 01/24/22 0819   01/22/22 1800  Ampicillin-Sulbactam (UNASYN) 3 g in sodium chloride 0.9 % 100 mL IVPB  Status:  Discontinued        3 g 200 mL/hr over 30 Minutes Intravenous Every 6 hours 01/22/22 1714 01/29/22 0928   01/22/22 1345  cefTRIAXone (ROCEPHIN) 2 g in sodium chloride 0.9 % 100 mL IVPB        2 g 200 mL/hr over 30 Minutes Intravenous  Once 01/22/22 1342 01/22/22 1430   01/22/22 1345  vancomycin (VANCOREADY) IVPB 1250 mg/250 mL        1,250 mg 166.7 mL/hr over 90 Minutes Intravenous  Once 01/22/22 1342 01/23/22 0435       Medications: Scheduled Meds:  amoxicillin-clavulanate  1 tablet Oral Q12H   aspirin EC  81 mg Oral Daily   furosemide  20 mg Oral QODAY   insulin aspart  0-5 Units Subcutaneous QHS   insulin aspart  0-9 Units Subcutaneous TID WC   insulin glargine-yfgn  15 Units Subcutaneous Daily   pantoprazole  40 mg Oral BID   spironolactone  50 mg Oral Daily   vancomycin  125 mg Oral BID   Continuous Infusions: PRN Meds:.diphenhydrAMINE, methocarbamol, morphine injection, prochlorperazine, traMADol    Objective: Weight change:   Intake/Output Summary (Last 24 hours) at 01/29/2022 0929 Last data filed at 01/29/2022  0758 Gross per 24 hour  Intake 1245.11 ml  Output --  Net 1245.11 ml   Blood pressure 136/84, pulse (!) 59, temperature 97.6 F (36.4 C), temperature source Oral, resp. rate 18, height 5\' 9"  (1.753 m), weight 70.4 kg, SpO2 99 %. Temp:  [97.6 F (36.4 C)-98.3 F (36.8 C)] 97.6 F (36.4 C) (09/25 0433) Pulse Rate:  [59-68] 59 (09/25 0433) Resp:  [18-19] 18 (09/25 0433) BP: (131-153)/(76-96) 136/84 (09/25 0433) SpO2:  [99 %-100 %] 99 % (09/25 0433)  Physical Exam: Physical Exam Constitutional:      General: She is not in acute distress.    Appearance: She is well-developed. She is not diaphoretic.  HENT:     Head: Normocephalic and atraumatic.     Right Ear: External ear normal.     Left Ear: External ear normal.     Mouth/Throat:     Pharynx: No oropharyngeal exudate.  Eyes:     General: No scleral icterus.    Conjunctiva/sclera: Conjunctivae normal.     Pupils: Pupils are equal, round, and reactive to light.  Cardiovascular:     Rate and Rhythm: Normal rate and regular rhythm.     Heart sounds:  Normal heart sounds. No murmur heard.    No friction rub. No gallop.  Pulmonary:     Effort: Pulmonary effort is normal. No respiratory distress.     Breath sounds: Normal breath sounds. No wheezing or rales.  Abdominal:     General: Bowel sounds are normal. There is no distension.     Palpations: Abdomen is soft.     Tenderness: There is no abdominal tenderness.  Musculoskeletal:        General: No tenderness. Normal range of motion.  Lymphadenopathy:     Cervical: No cervical adenopathy.  Skin:    General: Skin is warm and dry.     Coloration: Skin is not pale.     Findings: No erythema or rash.  Neurological:     General: No focal deficit present.     Mental Status: She is alert and oriented to person, place, and time.     Motor: No abnormal muscle tone.     Coordination: Coordination normal.  Psychiatric:        Mood and Affect: Mood normal.        Behavior:  Behavior normal.        Thought Content: Thought content normal.        Judgment: Judgment normal.   Drain in place  CBC:    BMET Recent Labs    01/28/22 0516 01/29/22 0424  NA 133* 135  K 4.4 3.9  CL 101 103  CO2 26 25  GLUCOSE 226* 118*  BUN 18 16  CREATININE 1.09* 1.03*  CALCIUM 8.2* 8.3*     Liver Panel  Recent Labs    01/28/22 0516 01/29/22 0424  PROT 5.3* 5.8*  ALBUMIN 2.2* 2.4*  AST 200* 81*  ALT 85* 60*  ALKPHOS 395* 317*  BILITOT 0.6 0.5       Sedimentation Rate No results for input(s): "ESRSEDRATE" in the last 72 hours. C-Reactive Protein No results for input(s): "CRP" in the last 72 hours.  Micro Results: Recent Results (from the past 720 hour(s))  Body fluid culture w Gram Stain     Status: None   Collection Time: 01/11/22 10:21 AM   Specimen: PATH Cytology Peritoneal fluid  Result Value Ref Range Status   Specimen Description   Final    PERITONEAL Performed at [facility], [address] [place], [place] [address]    Special Requests   Final    NONE Performed at [facility], [address] [place], [place] [address]    Gram Stain NO ORGANISMS SEEN NO WBC SEEN   Final   Culture   Final    NO GROWTH 3 DAYS Performed at [facility], [address] [place], [place] [address]    Report Status 01/14/2022 FINAL  Final  Blood Culture (routine x 2)     Status: None   Collection Time: 01/22/22 12:17 PM   Specimen: BLOOD  Result Value Ref Range Status   Specimen Description   Final    BLOOD RIGHT ANTECUBITAL Performed at [facility], [address] [place], [place] [address]    Special Requests   Final    BOTTLES DRAWN AEROBIC AND ANAEROBIC Blood Culture adequate volume Performed at [facility], [address] [place], [place] [address]    Culture   Final    NO GROWTH 5 DAYS Performed at [facility], [address] [place] [address]  Report Status 01/27/2022 FINAL  Final  Blood Culture (routine x 2)     Status: None   Collection Time: 01/22/22  5:06 PM   Specimen: BLOOD  Result Value Ref Range Status   Specimen Description   Final    BLOOD SITE NOT SPECIFIED Performed at [facility], [address] [place], [place] [address]    Special Requests   Final    BOTTLES DRAWN AEROBIC ONLY Blood Culture adequate volume Performed at [facility], [address] [place], [place] [address]    Culture   Final    NO GROWTH 5 DAYS Performed at [facility], [address], [place], [place] [address]    Report Status 01/27/2022 FINAL  Final  Aerobic/Anaerobic Culture w Gram Stain (surgical/deep wound)     Status: None   Collection Time: 01/23/22 10:06 AM   Specimen: PATH Cytology Peritoneal fluid  Result Value Ref Range Status   Specimen Description   Final    PERITONEAL Performed at [facility], [address] [place], [place] [address]    Special Requests   Final    NONE Performed at [facility], [address], [place], [place] [address]    Gram Stain NO WBC SEEN NO ORGANISMS SEEN   Final   Culture   Final    No growth aerobically or anaerobically. Performed at [facility], [address] [place], [place] [address]    Report Status 01/28/2022 FINAL  Final  Aerobic/Anaerobic Culture w Gram Stain (surgical/deep wound)     Status: None   Collection Time: 01/23/22  4:59 PM   Specimen: Abscess  Result Value Ref Range Status   Specimen Description   Final    ABSCESS Performed at [facility], [address] [place], [place] [address]    Special Requests   Final    NONE Performed at [facility], [address], [place], [place] [address]    Gram Stain   Final    ABUNDANT WBC PRESENT, PREDOMINANTLY PMN NO ORGANISMS SEEN    Culture   Final    MODERATE KLEBSIELLA  PNEUMONIAE NO ANAEROBES ISOLATED Performed at [facility], [address] [place], [place] [address]    Report Status 01/28/2022 FINAL  Final   Organism ID, Bacteria KLEBSIELLA PNEUMONIAE  Final      Susceptibility   Klebsiella pneumoniae - MIC*    AMPICILLIN >=32 RESISTANT Resistant     CEFAZOLIN <=4 SENSITIVE Sensitive     CEFEPIME <=0.12 SENSITIVE Sensitive     CEFTAZIDIME <=1 SENSITIVE Sensitive     CEFTRIAXONE <=0.25 SENSITIVE Sensitive     CIPROFLOXACIN <=0.25 SENSITIVE Sensitive     GENTAMICIN <=1 SENSITIVE Sensitive     IMIPENEM <=0.25 SENSITIVE Sensitive     TRIMETH/SULFA <=20 SENSITIVE Sensitive     AMPICILLIN/SULBACTAM 4 SENSITIVE Sensitive     PIP/TAZO <=4 SENSITIVE Sensitive     * MODERATE KLEBSIELLA PNEUMONIAE  Surgical pcr screen     Status: None   Collection Time: 01/25/22  7:46 AM   Specimen: Nasal Mucosa; Nasal Swab  Result Value Ref Range Status   MRSA, PCR NEGATIVE NEGATIVE Final   Staphylococcus aureus NEGATIVE NEGATIVE Final    Comment: (NOTE) The Xpert SA Assay (FDA approved for NASAL specimens in patients 68 years of age and older), is one component of a comprehensive surveillance program. It is not intended to diagnose  infection nor to guide or monitor treatment. Performed at [facility], [place] [address], [place], [place] [address]   Aerobic/Anaerobic Culture w Gram Stain (surgical/deep wound)     Status: None (Preliminary result)   Collection Time: 01/25/22  3:16 PM   Specimen: PATH Other; Tissue  Result Value Ref Range Status   Specimen Description   Final    ABSCESS Performed at [place] [address] [place], [place] [address]    Special Requests   Final    NONE BACK Performed at [place] [address], [place], [place] [address]    Gram Stain   Final    NO WBC SEEN NO ORGANISMS SEEN Performed at [facility], [place] [address], [place], [place] [address]     Culture   Final    RARE KLEBSIELLA PNEUMONIAE NO ANAEROBES ISOLATED; CULTURE IN PROGRESS FOR 5 DAYS    Report Status PENDING  Incomplete   Organism ID, Bacteria KLEBSIELLA PNEUMONIAE  Final      Susceptibility   Klebsiella pneumoniae - MIC*    AMPICILLIN >=32 RESISTANT Resistant     CEFAZOLIN <=4 SENSITIVE Sensitive     CEFEPIME <=0.12 SENSITIVE Sensitive     CEFTAZIDIME <=1 SENSITIVE Sensitive     CEFTRIAXONE <=0.25 SENSITIVE Sensitive     CIPROFLOXACIN <=0.25 SENSITIVE Sensitive     GENTAMICIN <=1 SENSITIVE Sensitive     IMIPENEM <=0.25 SENSITIVE Sensitive     TRIMETH/SULFA <=20 SENSITIVE Sensitive     AMPICILLIN/SULBACTAM 4 SENSITIVE Sensitive     PIP/TAZO <=4 SENSITIVE Sensitive     * RARE KLEBSIELLA PNEUMONIAE    Studies/Results: ECHOCARDIOGRAM COMPLETE  Result Date: 01/28/2022    ECHOCARDIOGRAM REPORT   Patient Name:   Mallory Stewart Date of Exam: 01/28/2022 Medical Rec #:  [id number]          Height:       69.0 in Accession #:    [id number]         Weight:       155.2 lb Date of Birth:  1960-09-25          BSA:          1.855 m Patient Age:    73 years           BP:           108/84 mmHg Patient Gender: F                  HR:           68 bpm. Exam Location:  Inpatient Procedure: 2D Echo Indications:    History of ascites  History:        Patient has prior history of Echocardiogram examinations, most                 recent 08/14/2018. Risk Factors:Diabetes and Hypertension.  Sonographer:    Harvie Junior Referring Phys: [id number] [place]  Sonographer Comments: Supine. IMPRESSIONS  1. Left ventricular ejection fraction, by estimation, is 55 to 60%. The left ventricle has normal function. The left ventricle has no regional wall motion abnormalities. Left ventricular diastolic parameters were normal.  2. Right ventricular systolic function is normal. The right ventricular size is normal. There is normal pulmonary artery systolic pressure.  3. Left atrial size was mildly  dilated.  4. The mitral valve is abnormal. Mild mitral valve regurgitation. No evidence of mitral stenosis.  5. The  aortic valve was not well visualized. There is moderate calcification of the aortic valve. There is moderate thickening of the aortic valve. Aortic valve regurgitation is not visualized. Aortic valve sclerosis/calcification is present, without any evidence of aortic stenosis.  6. The inferior vena cava is normal in size with greater than 50% respiratory variability, suggesting right atrial pressure of 3 mmHg. FINDINGS  Left Ventricle: Left ventricular ejection fraction, by estimation, is 55 to 60%. The left ventricle has normal function. The left ventricle has no regional wall motion abnormalities. The left ventricular internal cavity size was normal in size. There is  no left ventricular hypertrophy. Left ventricular diastolic parameters were normal. Right Ventricle: The right ventricular size is normal. No increase in right ventricular wall thickness. Right ventricular systolic function is normal. There is normal pulmonary artery systolic pressure. The tricuspid regurgitant velocity is 2.09 m/s, and  with an assumed right atrial pressure of 3 mmHg, the estimated right ventricular systolic pressure is 20.5 mmHg. Left Atrium: Left atrial size was mildly dilated. Right Atrium: Right atrial size was normal in size. Pericardium: There is no evidence of pericardial effusion. Mitral Valve: The mitral valve is abnormal. There is mild thickening of the mitral valve leaflet(s). There is mild calcification of the mitral valve leaflet(s). Mild mitral annular calcification. Mild mitral valve regurgitation. No evidence of mitral valve stenosis. Tricuspid Valve: The tricuspid valve is normal in structure. Tricuspid valve regurgitation is mild . No evidence of tricuspid stenosis. Aortic Valve: The aortic valve was not well visualized. There is moderate calcification of the aortic valve. There is moderate thickening  of the aortic valve. Aortic valve regurgitation is not visualized. Aortic valve sclerosis/calcification is present, without any evidence of aortic stenosis. Aortic valve mean gradient measures 5.0 mmHg. Aortic valve peak gradient measures 9.6 mmHg. Aortic valve area, by VTI measures 2.05 cm. Pulmonic Valve: The pulmonic valve was normal in structure. Pulmonic valve regurgitation is trivial. No evidence of pulmonic stenosis. Aorta: The aortic root is normal in size and structure. Venous: The inferior vena cava is normal in size with greater than 50% respiratory variability, suggesting right atrial pressure of 3 mmHg. IAS/Shunts: No atrial level shunt detected by color flow Doppler.  LEFT VENTRICLE PLAX 2D LVIDd:         4.75 cm     Diastology LVIDs:         2.90 cm     LV e' medial:    9.79 cm/s LV PW:         0.80 cm     LV E/e' medial:  7.8 LV IVS:        0.80 cm     LV e' lateral:   13.80 cm/s LVOT diam:     1.90 cm     LV E/e' lateral: 5.5 LV SV:         69 LV SV Index:   37 LVOT Area:     2.84 cm  LV Volumes (MOD) LV vol d, MOD A2C: 79.3 ml LV vol d, MOD A4C: 83.1 ml LV vol s, MOD A2C: 34.1 ml LV vol s, MOD A4C: 32.0 ml LV SV MOD A2C:     45.2 ml LV SV MOD A4C:     83.1 ml LV SV MOD BP:      48.6 ml RIGHT VENTRICLE RV Basal diam:  3.50 cm RV Mid diam:    2.90 cm RV S prime:     20.90 cm/s TAPSE (M-mode): 2.1 cm LEFT  ATRIUM             Index        RIGHT ATRIUM           Index LA diam:        4.10 cm 2.21 cm/m   RA Area:     11.00 cm LA Vol (A2C):   46.2 ml 24.91 ml/m  RA Volume:   22.00 ml  11.86 ml/m LA Vol (A4C):   53.4 ml 28.79 ml/m LA Biplane Vol: 54.3 ml 29.27 ml/m  AORTIC VALVE                     PULMONIC VALVE AV Area (Vmax):    1.96 cm      PV Vmax:          0.87 m/s AV Area (Vmean):   2.00 cm      PV Peak grad:     3.0 mmHg AV Area (VTI):     2.05 cm      PR End Diast Vel: 3.21 msec AV Vmax:           155.00 cm/s AV Vmean:          104.000 cm/s AV VTI:            0.338 m AV Peak Grad:       9.6 mmHg AV Mean Grad:      5.0 mmHg LVOT Vmax:         107.00 cm/s LVOT Vmean:        73.300 cm/s LVOT VTI:          0.244 m LVOT/AV VTI ratio: 0.72  AORTA Ao Root diam: 3.20 cm Ao Asc diam:  3.30 cm MITRAL VALVE               TRICUSPID VALVE MV Area (PHT): 3.31 cm    TR Peak grad:   17.5 mmHg MV Decel Time: 229 msec    TR Vmax:        209.00 cm/s MR Peak grad: 67.9 mmHg MR Vmax:      412.00 cm/s  SHUNTS MV E velocity: 75.90 cm/s  Systemic VTI:  0.24 m MV A velocity: 59.70 cm/s  Systemic Diam: 1.90 cm MV E/A ratio:  1.27 Charlton Haws MD Electronically signed by Charlton Haws MD Signature Date/Time: 01/28/2022/3:25:36 PM    Final    DG ERCP  Result Date: 01/27/2022 CLINICAL DATA:  Biliary obstruction EXAM: ERCP TECHNIQUE: Multiple spot images obtained with the fluoroscopic device and submitted for interpretation post-procedure. FLUOROSCOPY: Radiation Exposure Index (as provided by the fluoroscopic device): 93.79 mGy Kerma COMPARISON:  MRCP 01/25/2022 FINDINGS: A total of 11 intraoperative spot images are submitted for review. The images demonstrate a flexible duodenal scope in the descending duodenum followed by wire cannulation of the pancreatic duct and common bile duct. On the final images, a plastic stent has been placed in the pancreatic duct. IMPRESSION: ERCP with placement of a plastic pancreatic stent. These images were submitted for radiologic interpretation only. Please see the procedural report for the amount of contrast and the fluoroscopy time utilized. Electronically Signed   By: Malachy Moan M.D.   On: 01/27/2022 14:51      Assessment/Plan:  INTERVAL HISTORY: Patient continues to improve and has been cleared by surgery for discharge with drain in place   Principal Problem:   Sepsis (HCC) Active Problems:   HTN (hypertension)   Pancreatic abscess   Abnormal CT  of the abdomen   Chronic hyponatremia   Type 2 diabetes mellitus with hyperglycemia, with long-term current use of  insulin (HCC)   Pancreatic pseudocyst   Ascites   Cellulitis   Allergic reaction   Acute pancreatic fluid collection   Cellulitis of back   Infected pseudocyst of pancreas   Cellulitis and abscess of trunk   Infected pancreatic pseudocyst   Leakage from tail of pancreas   Medication monitoring encounter   Abdominal wall abscess   Atrophic pancreas   Alcohol-induced chronic pancreatitis (HCC)   Pancreatic duct leak   Decompensated hepatic cirrhosis (HCC)   Abnormal LFTs   Pancreatic duct stricture   Biliary stricture    Mallory Stewart is a 61 y.o. female with  alcoholic liver cirrhosis, alcohol induced chronic pancreatitis with pseudocyst, C diff who presented  with left lower back redness, swelling and redness/drainage for 1 day with chills. Complicated with infected pseudocyst, posterior abdominal wall abscess with draining wound at the left lower back. She is s/p paracentesis, IR aspiration of intraabdominal fluid ( Kleb pneumo) and I and D of Left lower back abscess  #1 infected pseudocyst with fistulization to skin status post paracentesis and aspiration of abdominal fluid with placement of drain.  I think she can be changed over to Augmentin I would make sure she has a month supply.  She will have repeat imaging in 3 weeks time.  She also has follow-up with me in infectious disease  #2 she of colostrum to the seal colitis: We will continue vancomycin prophylactically while she is on Augmentin    Mallory Stewart has an appointment on 02/22/2022 at 845 Am with Dr. Daiva Eves  at  [facility], which  is located in the [facility] at  [address] [place] in [place].  [address], which is located to the left of the elevators.  Phone: [phone]  Fax: [phone]  [email or web address]  The patient should arrive 30 minutes prior to their appoitment.  I spent 51 minutes with the patient  including than 50% of the time in face to face counseling of the patientre her infection  p along with review of medical records in preparation for the visit and during the visit and in coordination of her care.    LOS: 7 days   Acey Lav 01/29/2022, 9:29 AM

## 2022-01-30 ENCOUNTER — Other Ambulatory Visit: Payer: Self-pay

## 2022-01-30 ENCOUNTER — Telehealth: Payer: Self-pay

## 2022-01-30 DIAGNOSIS — K863 Pseudocyst of pancreas: Secondary | ICD-10-CM

## 2022-01-30 LAB — AEROBIC/ANAEROBIC CULTURE W GRAM STAIN (SURGICAL/DEEP WOUND): Gram Stain: NONE SEEN

## 2022-01-30 NOTE — Telephone Encounter (Signed)
CT order placed and message sent to radiology scheduling for scheduling purposes.

## 2022-01-30 NOTE — Telephone Encounter (Signed)
-----   Message from Willia Craze, NP sent at 01/29/2022 10:07 AM EDT ----- Good morning, will you arrange  pancreas protocol CT abdomen/pelvis in 3 weeks to reevaluate pancreatic fluid collections/infected pseudocyst. Please order under Dr. Rush Landmark. Thanks

## 2022-01-31 LAB — CYTOLOGY - NON PAP

## 2022-02-01 ENCOUNTER — Encounter: Payer: Self-pay | Admitting: Gastroenterology

## 2022-02-01 ENCOUNTER — Telehealth: Payer: Self-pay

## 2022-02-01 ENCOUNTER — Telehealth (INDEPENDENT_AMBULATORY_CARE_PROVIDER_SITE_OTHER): Payer: 59 | Admitting: Family Medicine

## 2022-02-01 ENCOUNTER — Encounter: Payer: Self-pay | Admitting: Family Medicine

## 2022-02-01 VITALS — BP 132/80 | Ht 69.0 in

## 2022-02-01 DIAGNOSIS — K7031 Alcoholic cirrhosis of liver with ascites: Secondary | ICD-10-CM

## 2022-02-01 DIAGNOSIS — E1165 Type 2 diabetes mellitus with hyperglycemia: Secondary | ICD-10-CM

## 2022-02-01 DIAGNOSIS — E871 Hypo-osmolality and hyponatremia: Secondary | ICD-10-CM | POA: Diagnosis not present

## 2022-02-01 DIAGNOSIS — K860 Alcohol-induced chronic pancreatitis: Secondary | ICD-10-CM

## 2022-02-01 DIAGNOSIS — Z794 Long term (current) use of insulin: Secondary | ICD-10-CM

## 2022-02-01 DIAGNOSIS — K863 Pseudocyst of pancreas: Secondary | ICD-10-CM | POA: Diagnosis not present

## 2022-02-01 DIAGNOSIS — L03312 Cellulitis of back [any part except buttock]: Secondary | ICD-10-CM

## 2022-02-01 DIAGNOSIS — E1159 Type 2 diabetes mellitus with other circulatory complications: Secondary | ICD-10-CM

## 2022-02-01 NOTE — Progress Notes (Signed)
VIRTUAL VISIT A virtual visit is felt to be most appropriate for this patient at this time.   I connected with the patient on 02/01/22 at  2:40 PM EDT by virtual telehealth platform and verified that I am speaking with the correct person using two identifiers.   I discussed the limitations, risks, security and privacy concerns of performing an evaluation and management service by  virtual telehealth platform and the availability of in person appointments. I also discussed with the patient that there may be a patient responsible charge related to this service. The patient expressed understanding and agreed to proceed.  Patient location: Home Provider Location:  [facility] Participants: Mallory Stewart and Mallory Stewart   Chief Complaint  Patient presents with   Hospitalization Follow-up    History of Present Illness: 61 year old female with history of chronic pancreatitis, DM, HTNalcoholic cirrhosis of the liver with recent emergency room visit for infected pancreatic pseudocyst with associated cellulitis. Reviewed hospital discharge summary in detail and copy pertinent information below  Admit date: 01/22/2022 Discharge date: 01/29/2022  CT imaging showed features of cellulitis of the left flank, large amount of ascites , a large peripherally enhancing complex fluid collection  within the left posterior pararenal space, with posterior extension through the posterior left abdominal wall.  General surgery, IR, GI, ID consulted and following. Started on broad-spectrum antibiotics.  General surgery did  incision and drainage of abscess on the back on 9/21.  GI  did ERCP on 9/23.      Today she reports she has been feeling better overall.  Wound is open with drain... changing dressing three times daily.   Malignant cells not seen on  cytology.   No fever No abdominal pain.Marland Kitchen was filling back up with fluid in abdomen. She is back on furosemide daily  and spironolactone  On  Augmentin and Vancomycin for 28 days.. she is tolerating well.   FBS running 94-100, nothing < 60, or above 200. Moderate nutrition with protein drinks.   COVID 19 screen No recent travel or known exposure to COVID19 The patient denies respiratory symptoms of COVID 19 at this time.  The importance of social distancing was discussed today.   Review of Systems  Constitutional:  Positive for malaise/fatigue. Negative for chills and fever.  HENT:  Negative for congestion and ear pain.   Eyes:  Negative for pain and redness.  Respiratory:  Negative for cough and shortness of breath.   Cardiovascular:  Negative for chest pain, palpitations and leg swelling.  Gastrointestinal:  Negative for abdominal pain, blood in stool, constipation, diarrhea, nausea and vomiting.  Genitourinary:  Negative for dysuria.  Musculoskeletal:  Negative for falls and myalgias.  Skin:  Negative for rash.  Neurological:  Negative for dizziness.  Psychiatric/Behavioral:  Negative for depression. The patient is not nervous/anxious.       Past Medical History:  Diagnosis Date   Alcohol-induced chronic pancreatitis (HCC)    Alcoholic cirrhosis ([place])    B12 deficiency    Concussion    DDD (degenerative disc disease), cervical    Diabetes ([place])    DKA, type 2 ([place])    Gastric outlet obstruction 08/12/2018   Hypertension    Neuropathy    Pancreatic pseudocyst/cyst 05/06/2013   Seasonal allergies     reports that she has never smoked. She has never used smokeless tobacco. She reports that she does not currently use alcohol after a past usage of about 7.0 standard drinks of alcohol per week. She  reports that she does not use drugs.   Current Outpatient Medications:    amoxicillin-clavulanate (AUGMENTIN) 875-125 MG tablet, Take 1 tablet by mouth 2 (two) times daily for 28 days., Disp: 56 tablet, Rfl: 0   aspirin EC 81 MG EC tablet, Take 1 tablet (81 mg total) by mouth 2 (two) times daily. Swallow whole., Disp: 30  tablet, Rfl: 11   Blood Glucose Monitoring Suppl (ONE TOUCH ULTRA 2) w/Device KIT, Use to check blood sugars two times day, Disp: 1 each, Rfl: 0   Calcium Carb-Cholecalciferol (CALCIUM+D3 PO), Take 1 tablet by mouth daily., Disp: , Rfl:    cetirizine (ZYRTEC) 10 MG tablet, Take 10 mg by mouth at bedtime., Disp: , Rfl:    cholecalciferol (VITAMIN D3) 25 MCG (1000 UNIT) tablet, Take 1,000 Units by mouth daily., Disp: , Rfl:    cyanocobalamin 2000 MCG tablet, Take 2,000 mcg by mouth daily., Disp: , Rfl:    furosemide (LASIX) 20 MG tablet, Take 1 tablet (20 mg total) by mouth daily., Disp: 30 tablet, Rfl: 0   glucose blood (ONETOUCH ULTRA) test strip, CHECK BLOOD SUGAR TWICE DAILY AS DIRECTED, Disp: 100 strip, Rfl: 3   Insulin Glargine (BASAGLAR KWIKPEN) 100 UNIT/ML, Inject 30 Units into the skin daily., Disp: , Rfl:    insulin lispro (HUMALOG KWIKPEN) 100 UNIT/ML KwikPen, Inject 2-10 units into the skin before meals as needed for elevated BGL, PER SLIDING SCALE, Disp: , Rfl:    Insulin Pen Needle 32G X 4 MM MISC, 1 Device by Does not apply route in the morning, at noon, in the evening, and at bedtime., Disp: 400 each, Rfl: 3   Lancets (ONETOUCH ULTRASOFT) lancets, Use to check blood sugar two times a day., Disp: 200 each, Rfl: 3   meclizine (ANTIVERT) 25 MG tablet, Take 25 mg by mouth daily as needed for dizziness., Disp: , Rfl:    Melatonin 10 MG TABS, Take 20 mg by mouth at bedtime., Disp: , Rfl:    pantoprazole (PROTONIX) 40 MG tablet, TAKE 1 TABLET BY MOUTH TWICE DAILY BEFORE A MEAL, Disp: 180 tablet, Rfl: 1   spironolactone (ALDACTONE) 50 MG tablet, Take 1 tablet (50 mg total) by mouth daily., Disp: 30 tablet, Rfl: 0   traMADol (ULTRAM) 50 MG tablet, Take 1 tablet (50 mg total) by mouth every 6 (six) hours as needed for up to 5 days., Disp: 20 tablet, Rfl: 0   vancomycin (VANCOCIN) 125 MG capsule, Take 1 capsule (125 mg total) by mouth daily for 28 days., Disp: 28 capsule, Rfl: 0    Observations/Objective: Blood pressure 132/80, height $RemoveBefore'5\' 9"'[id number]$  (1.753 m).  Physical Exam  Physical Exam Constitutional:      General: The patient is not in acute distress. Pulmonary:     Effort: Pulmonary effort is normal. No respiratory distress.  Neurological:     Mental Status: The patient is alert and oriented to person, place, and time.  Psychiatric:        Mood and Affect: Mood normal.        Behavior: Behavior normal.   Assessment and Plan    I discussed the assessment and treatment plan with the patient. The patient was provided an opportunity to ask questions and all were answered. The patient agreed with the plan and demonstrated an understanding of the instructions.   The patient was advised to call back or seek an in-person evaluation if the symptoms worsen or if the condition fails to improve as anticipated.  Problem List Items Addressed This Visit     Chronic alcoholic pancreatitis ([place]) (Chronic)    Chronically followed by GI.      Alcoholic cirrhosis of liver with ascites (HCC)    Chronic with acute worsening Per patient ascites and edema is improving gradually but significantly back on furosemide and spironolactone diuresis.      Cellulitis of back    Acute, improving She has noted continued improvement on Augmentin and vancomycin.  She will continue this for the full 28 days.  She denies side effects to these medications at this time.      Chronic hyponatremia    Chronic recurrent issue due to diuresis. Given reaccumulation of ascites she has been placed back on both spironolactone and furosemide.  We will get her set up for standing order of basic mild metabolic panel once a week.  At last check on hospital discharge on January 29, 2022 sodium was in the normal range.      Diabetes mellitus with circulatory complication, HTN (HCC)   Relevant Medications   Insulin Glargine (BASAGLAR KWIKPEN) 100 UNIT/ML   insulin lispro (HUMALOG KWIKPEN) 100  UNIT/ML KwikPen   RESOLVED: Hyponatremia - Primary   Relevant Orders   Basic Metabolic Panel   Pancreatic pseudocyst    Acute, recent incision and drainage with resulting cytology showing no malignancy.      Type 2 diabetes mellitus with hyperglycemia, with long-term current use of insulin (HCC)    Chronic, recent stable control despite recent infection.  Continue current medications per Endo.      Relevant Medications   Insulin Glargine (BASAGLAR KWIKPEN) 100 UNIT/ML   insulin lispro (HUMALOG KWIKPEN) 100 UNIT/ML KwikPen   Orders Placed This Encounter  Procedures   Basic Metabolic Panel    Standing Status:   Standing    Number of Occurrences:   4    Standing Expiration Date:   02/02/2023    Order Specific Question:   Has the patient fasted?    Answer:   No     Mallory Lofts, MD

## 2022-02-01 NOTE — Assessment & Plan Note (Signed)
Chronic recurrent issue due to diuresis. Given reaccumulation of ascites she has been placed back on both spironolactone and furosemide.  We will get her set up for standing order of basic mild metabolic panel once a week.  At last check on hospital discharge on January 29, 2022 sodium was in the normal range.

## 2022-02-01 NOTE — Assessment & Plan Note (Signed)
Acute, improving She has noted continued improvement on Augmentin and vancomycin.  She will continue this for the full 28 days.  She denies side effects to these medications at this time.

## 2022-02-01 NOTE — Assessment & Plan Note (Signed)
Chronically followed by GI.

## 2022-02-01 NOTE — Assessment & Plan Note (Signed)
Chronic, recent stable control despite recent infection.  Continue current medications per Endo.

## 2022-02-01 NOTE — Assessment & Plan Note (Signed)
Acute, recent incision and drainage with resulting cytology showing no malignancy.

## 2022-02-01 NOTE — Assessment & Plan Note (Signed)
Chronic with acute worsening Per patient ascites and edema is improving gradually but significantly back on furosemide and spironolactone diuresis.

## 2022-02-01 NOTE — Telephone Encounter (Signed)
Transition Care Management Follow-up Telephone Call Date of discharge and from where: 01/29/2022  How have you been since you were released from the hospital? Doing ok still having some fluid but ok at this time.  Any questions or concerns? No  Items Reviewed: Did the pt receive and understand the discharge instructions provided? Yes  Medications obtained and verified? Yes  Other? No  Any new allergies since your discharge? Yes  Dietary orders reviewed? Yes Do you have support at home? Yes   Home Care and Equipment/Supplies: Were home health services ordered? no If so, what is the name of the agency? N/a  Has the agency set up a time to come to the patient's home? not applicable Were any new equipment or medical supplies ordered?  No What is the name of the medical supply agency? N/a  Were you able to get the supplies/equipment? not applicable Do you have any questions related to the use of the equipment or supplies? No  Functional Questionnaire: (I = Independent and D = Dependent) ADLs:    Bathing/Dressing- I  Meal Prep- I  Eating- I  Maintaining continence- I  Transferring/Ambulation- I  Managing Meds- I  Follow up appointments reviewed:  PCP Hospital f/u appt confirmed? Yes has virtual appointment  Specialist Hospital f/u appt confirmed? No  Patient working on setting up follow ups and if any problems we will be happy to help.  Are transportation arrangements needed? Yes  not driving right now but has family to help.  If their condition worsens, is the pt aware to call PCP or go to the Emergency Dept.? Yes Was the patient provided with contact information for the PCP's office or ED? Yes Was to pt encouraged to call back with questions or concerns? Yes

## 2022-02-05 ENCOUNTER — Other Ambulatory Visit (INDEPENDENT_AMBULATORY_CARE_PROVIDER_SITE_OTHER): Payer: 59

## 2022-02-05 DIAGNOSIS — E871 Hypo-osmolality and hyponatremia: Secondary | ICD-10-CM | POA: Diagnosis not present

## 2022-02-05 LAB — BASIC METABOLIC PANEL
BUN: 11 mg/dL (ref 6–23)
CO2: 28 mEq/L (ref 19–32)
Calcium: 9.3 mg/dL (ref 8.4–10.5)
Chloride: 99 mEq/L (ref 96–112)
Creatinine, Ser: 1 mg/dL (ref 0.40–1.20)
GFR: 61.87 mL/min (ref >60.00)
Glucose, Bld: 78 mg/dL (ref 70–99)
Potassium: 4.5 mEq/L (ref 3.5–5.1)
Sodium: 136 mEq/L (ref 135–145)

## 2022-02-08 ENCOUNTER — Telehealth: Payer: Self-pay

## 2022-02-08 LAB — LIPASE, FLUID

## 2022-02-08 NOTE — Telephone Encounter (Signed)
Patient states that has been having low blood sugars for the last several days . Would like to know if she needs to reduce the ling acting .    02/08/22  62 02/07/22  65 02/06/2022 64

## 2022-02-08 NOTE — Telephone Encounter (Signed)
Patient advise to reduce Basaglar to 24 units instead of 30 units.

## 2022-02-12 ENCOUNTER — Other Ambulatory Visit (INDEPENDENT_AMBULATORY_CARE_PROVIDER_SITE_OTHER): Payer: 59

## 2022-02-12 DIAGNOSIS — E871 Hypo-osmolality and hyponatremia: Secondary | ICD-10-CM | POA: Diagnosis not present

## 2022-02-12 LAB — BASIC METABOLIC PANEL
BUN: 12 mg/dL (ref 6–23)
CO2: 27 mEq/L (ref 19–32)
Calcium: 9.5 mg/dL (ref 8.4–10.5)
Chloride: 100 mEq/L (ref 96–112)
Creatinine, Ser: 0.99 mg/dL (ref 0.40–1.20)
GFR: 62.61 mL/min (ref >60.00)
Glucose, Bld: 95 mg/dL (ref 70–99)
Potassium: 4.5 mEq/L (ref 3.5–5.1)
Sodium: 136 mEq/L (ref 135–145)

## 2022-02-13 NOTE — Progress Notes (Signed)
No critical labs need to be addressed urgently. We will discuss labs in detail at upcoming office visit.   

## 2022-02-16 ENCOUNTER — Telehealth: Payer: Self-pay | Admitting: Family Medicine

## 2022-02-16 ENCOUNTER — Telehealth: Payer: Self-pay | Admitting: Gastroenterology

## 2022-02-16 ENCOUNTER — Other Ambulatory Visit: Payer: 59

## 2022-02-16 DIAGNOSIS — E871 Hypo-osmolality and hyponatremia: Secondary | ICD-10-CM

## 2022-02-16 NOTE — Telephone Encounter (Signed)
This is a pt of Dr Carlean Purl- will send to his nurse.

## 2022-02-16 NOTE — Telephone Encounter (Signed)
-----   Message from Ellamae Sia sent at 02/09/2022  3:44 PM EDT ----- Regarding: Lab orders for Monday, 10.16.23 Patient is scheduled for CPX labs, please order future labs, Thanks , Karna Christmas

## 2022-02-16 NOTE — Telephone Encounter (Signed)
Left message on machine to call back  

## 2022-02-16 NOTE — Telephone Encounter (Signed)
Inbound call from patient she is schedule to see "Mallory Stewart' 03/15/2022 patient states she cant wait the long ,she is having trouble breathing.Please advise

## 2022-02-19 ENCOUNTER — Other Ambulatory Visit: Payer: Self-pay | Admitting: Internal Medicine

## 2022-02-19 ENCOUNTER — Other Ambulatory Visit: Payer: Self-pay

## 2022-02-19 ENCOUNTER — Other Ambulatory Visit (INDEPENDENT_AMBULATORY_CARE_PROVIDER_SITE_OTHER): Payer: 59

## 2022-02-19 DIAGNOSIS — K7031 Alcoholic cirrhosis of liver with ascites: Secondary | ICD-10-CM

## 2022-02-19 DIAGNOSIS — E871 Hypo-osmolality and hyponatremia: Secondary | ICD-10-CM | POA: Diagnosis not present

## 2022-02-19 LAB — BASIC METABOLIC PANEL
BUN: 12 mg/dL (ref 6–23)
CO2: 29 mEq/L (ref 19–32)
Calcium: 9.2 mg/dL (ref 8.4–10.5)
Chloride: 100 mEq/L (ref 96–112)
Creatinine, Ser: 0.9 mg/dL (ref 0.40–1.20)
GFR: 70.19 mL/min (ref >60.00)
Glucose, Bld: 148 mg/dL — ABNORMAL HIGH (ref 70–99)
Potassium: 4.5 mEq/L (ref 3.5–5.1)
Sodium: 138 mEq/L (ref 135–145)

## 2022-02-19 MED ORDER — FUROSEMIDE 40 MG PO TABS: 40.0000 mg | ORAL_TABLET | Freq: Every day | ORAL | 0 refills | Status: DC

## 2022-02-19 MED ORDER — SPIRONOLACTONE 100 MG PO TABS: 100.0000 mg | ORAL_TABLET | Freq: Every day | ORAL | 0 refills | Status: DC

## 2022-02-19 NOTE — Telephone Encounter (Signed)
Pt was made aware of Dr. Lorenso Courier recommendations: Prescription was sent for lasix and spironolactone for 30 days: Pt made aware Order for lab placed in Epic: Pt made aware to come Friday and get lab checked:  Pt made aware if she starts experiencing shortness of breath, lightheadedness or worsening abdominal distention to please call us or go to the ED:   Pt verbalized understanding with all questions answered.

## 2022-02-19 NOTE — Telephone Encounter (Signed)
Pt made aware of DOD Dr. Lorenso Courier recommendations: Pt stated that her insurance will not cover the CT scan done at the hospital: Chart reviewed and pt is scheduled through [facility] for future CT scan: [facility]   contacted and requested CT done today or tomorrow and was notified that  there in no availability at any of there offices for today or tomorrow: Pt was scheduled for Wednesday for the CT scan in [place]: at 10:00 AM pt to arrive at 9:00 AM Nothing to eat or drink 4 hours prior: Pt made aware: Address provided:  [address] [place] [address] Phone Number [phone]  Pt verbalized understanding with all questions answered.

## 2022-02-19 NOTE — Telephone Encounter (Signed)
Dr. Carlean Stewart pt last seen by Tye Savoy NP  Office visit on 03/15/2022 for follow up with Tye Savoy NP   Pt states that she has ascites in her abdomen and is very distended; clothes are tight, weight gain, No shortness of breath: Last paracentesis 01/23/2022   Pt requesting paracentesis  Please advise as DOD

## 2022-02-20 ENCOUNTER — Ambulatory Visit (HOSPITAL_COMMUNITY): Payer: 59

## 2022-02-21 ENCOUNTER — Ambulatory Visit
Admission: RE | Admit: 2022-02-21 | Discharge: 2022-02-21 | Disposition: A | Discharge status: Home / Self Care | Payer: 59 | Source: Ambulatory Visit | Attending: Gastroenterology | Admitting: Gastroenterology

## 2022-02-21 ENCOUNTER — Telehealth: Payer: Self-pay

## 2022-02-21 ENCOUNTER — Inpatient Hospital Stay: Admission: RE | Admit: 2022-02-21 | Payer: 59 | Source: Ambulatory Visit

## 2022-02-21 ENCOUNTER — Other Ambulatory Visit: Payer: Self-pay | Admitting: Gastroenterology

## 2022-02-21 DIAGNOSIS — K863 Pseudocyst of pancreas: Secondary | ICD-10-CM

## 2022-02-21 DIAGNOSIS — R52 Pain, unspecified: Secondary | ICD-10-CM

## 2022-02-21 NOTE — Telephone Encounter (Signed)
What order do you need? If you place the order, put under my name and I'll sign. Thanks. GM

## 2022-02-21 NOTE — Telephone Encounter (Signed)
Unfortunately there is no availability for an outpatient CT scan for about 1 week.  Though it is possible her pancreatic cyst could be a significant part of this issue from talking to her and based upon her experience she believes this is ascites that is free as opposed to the cyst which affected her flank more before.  She is having some increased pain there but she has diffuse abdominal swelling.  Change in plans:   Do not repeat CT scan at this point  Call interventional radiology and see if she cannot have a paracentesis this week.   Drain up to 5 L fluid and administer 50 g albumin afterwards  Labs to send from abdominal fluid (ascites):   cell count and differential  Culture  Amylase  Albumin

## 2022-02-21 NOTE — Telephone Encounter (Signed)
The original order was ordered by Dr. Lorenso Courier to my understanding: I am not sure who [facility] ( [facility]) placed the order under: I am not sure how the request to sign orders comes to you guys through Epic: I will send Dr. Lorenso Courier a secure chat and see if she can take a glance at this.

## 2022-02-21 NOTE — Telephone Encounter (Signed)
We should try to do it without IV contrast then

## 2022-02-21 NOTE — Telephone Encounter (Signed)
Received call from [facility] ( [facility]) in [place] stating that they were unable to get the CT scan done on the pt due to not able to get an IV for IV contrast: Pt was transferred to [place] for an appointment this afternoon: Representative stated that they had to place an additional order and needs to be signed prior to exam:  Please assist

## 2022-02-21 NOTE — Telephone Encounter (Signed)
Pt was stuck four times at  [facility] ( [facility]) in [place] this AM; Pt rescheduled to [facility] office in the afternoon and stuck an additional 4 times in which two of those times the IV blew: Please advise

## 2022-02-22 ENCOUNTER — Other Ambulatory Visit: Payer: Self-pay

## 2022-02-22 ENCOUNTER — Ambulatory Visit (INDEPENDENT_AMBULATORY_CARE_PROVIDER_SITE_OTHER): Payer: 59 | Admitting: Infectious Disease

## 2022-02-22 ENCOUNTER — Encounter: Payer: Self-pay | Admitting: Infectious Disease

## 2022-02-22 ENCOUNTER — Inpatient Hospital Stay: Admission: RE | Admit: 2022-02-22 | Payer: 59 | Source: Ambulatory Visit

## 2022-02-22 VITALS — BP 165/107 | HR 96 | Temp 97.8°F | Ht 69.0 in | Wt 154.0 lb

## 2022-02-22 DIAGNOSIS — L03312 Cellulitis of back [any part except buttock]: Secondary | ICD-10-CM

## 2022-02-22 DIAGNOSIS — K7031 Alcoholic cirrhosis of liver with ascites: Secondary | ICD-10-CM

## 2022-02-22 DIAGNOSIS — K860 Alcohol-induced chronic pancreatitis: Secondary | ICD-10-CM

## 2022-02-22 DIAGNOSIS — K863 Pseudocyst of pancreas: Secondary | ICD-10-CM

## 2022-02-22 DIAGNOSIS — A498 Other bacterial infections of unspecified site: Secondary | ICD-10-CM

## 2022-02-22 MED ORDER — AMOXICILLIN-POT CLAVULANATE 875-125 MG PO TABS: 1.0000 | ORAL_TABLET | Freq: Two times a day (BID) | ORAL | 1 refills | Status: DC

## 2022-02-22 MED ORDER — ALBUMIN HUMAN 25 % IV SOLN: 50.0000 g | Freq: Once | INTRAVENOUS | Status: DC

## 2022-02-22 MED ORDER — VANCOMYCIN HCL 125 MG PO CAPS: 125.0000 mg | ORAL_CAPSULE | Freq: Every day | ORAL | 1 refills | Status: DC

## 2022-02-22 NOTE — Progress Notes (Signed)
Subjective:  Chief complaint area of induration near her fistula  Patient ID: Mallory Stewart, female    DOB: 04-17-1961, 61 y.o.   MRN: [id number]  HPI  61 y.o. female with  alcoholic liver cirrhosis, alcohol induced chronic pancreatitis with pseudocyst, C diff who presented  with left lower back redness, swelling and redness/drainage for 1 day with chills. Complicated with infected pseudocyst, posterior abdominal wall abscess with draining wound at the left lower back. She is s/p paracentesis, IR aspiration of intraabdominal fluid ( Kleb pneumo) and I and D of Left lower back abscess.  She was discharged on oral Augmentin she which she remains on as well as vancomycin prophylactically to prevent C. difficile colitis which she had once 2 months ago.  She is continue have drainage from the open wound on her back and there is also an area of induration that seems to be worsening as well.  She was set up for CT scan in [place] and also here in [place] but apparently no one could obtain IV access to execute the CT scan she says that literally 6 different people tried to put an IV in.  She said Dr. Carlean Purl is working on getting this CAT scan done.  Certainly I think if this is continue to be a problem the most prudent thing would be to have her directly admitted to the hospital and have someone place an IV versus even a central line to obtain the study and to see if she needs further surgical attention.    Past Medical History:  Diagnosis Date   Alcohol-induced chronic pancreatitis (HCC)    Alcoholic cirrhosis ([place])    B12 deficiency    Concussion    DDD (degenerative disc disease), cervical    Diabetes ([place])    DKA, type 2 ([place])    Gastric outlet obstruction 08/12/2018   Hypertension    Neuropathy    Pancreatic pseudocyst/cyst 05/06/2013   Seasonal allergies     Past Surgical History:  Procedure Laterality Date   ANKLE ARTHROSCOPY Right 12/20/2020   Procedure: RIGHT ANKLE  ARTHROSCOPIC DEBRIDEMENT;  Surgeon: Newt Minion, MD;  Location: [place];  Service: Orthopedics;  Laterality: Right;   BILIARY BRUSHING  01/27/2022   Procedure: BILIARY BRUSHING;  Surgeon: Rush Landmark Telford Nab., MD;  Location: [facility];  Service: Gastroenterology;;   BIOPSY  01/19/2019   Procedure: BIOPSY;  Surgeon: Irving Copas., MD;  Location: [place];  Service: Gastroenterology;;   CHOLECYSTECTOMY N/A 07/28/2016   Procedure: LAPAROSCOPIC CHOLECYSTECTOMY WITH INTRAOPERATIVE CHOLANGIOGRAM;  Surgeon: Mickeal Skinner, MD;  Location: [facility];  Service: General;  Laterality: N/A;   ERCP N/A 01/27/2022   Procedure: ENDOSCOPIC RETROGRADE CHOLANGIOPANCREATOGRAPHY (ERCP);  Surgeon: Irving Copas., MD;  Location: [facility];  Service: Gastroenterology;  Laterality: N/A;   ESOPHAGOGASTRODUODENOSCOPY (EGD) WITH PROPOFOL N/A 08/13/2018   Procedure: ESOPHAGOGASTRODUODENOSCOPY (EGD) WITH PROPOFOL;  Surgeon: Rush Landmark Telford Nab., MD;  Location: [place];  Service: Gastroenterology;  Laterality: N/A;   ESOPHAGOGASTRODUODENOSCOPY (EGD) WITH PROPOFOL N/A 01/19/2019   Procedure: ESOPHAGOGASTRODUODENOSCOPY (EGD) WITH PROPOFOL;  Surgeon: Rush Landmark Telford Nab., MD;  Location: [place];  Service: Gastroenterology;  Laterality: N/A;   ESOPHAGOGASTRODUODENOSCOPY (EGD) WITH PROPOFOL N/A 12/28/2021   Procedure: ESOPHAGOGASTRODUODENOSCOPY (EGD) WITH PROPOFOL;  Surgeon: Rush Landmark Telford Nab., MD;  [facility];  Service: Gastroenterology;  Laterality: N/A;   EUS N/A 12/28/2021   Procedure: UPPER ENDOSCOPIC ULTRASOUND (EUS) LINEAR;  Surgeon: Rush Landmark Telford Nab., MD;  [facility];  Service:  Gastroenterology;  Laterality: N/A;   INCISION AND DRAINAGE ABSCESS N/A 01/25/2022   Procedure: INCISION AND DRAINAGE BACK ABSCESS;  Surgeon: Kieth Brightly, Arta Bruce, MD;  [facility];  Service: General;  Laterality: N/A;  90 L DOW   IR PARACENTESIS   08/08/2020   IR PARACENTESIS  03/13/2021   IR US GUIDE BX ASP/DRAIN  01/23/2022   LEEP  1990's   ORIF FOOT FRACTURE  09/2008   L 5th metatarsal   PANCREATIC STENT PLACEMENT  01/27/2022   Procedure: PANCREATIC STENT PLACEMENT;  Surgeon: Irving Copas., MD;  Location: [facility];  Service: Gastroenterology;;   REFRACTIVE SURGERY  2000   REMOVAL OF STONES  01/27/2022   Procedure: REMOVAL OF STONES;  Surgeon: Irving Copas., MD;  Location: [facility];  Service: Gastroenterology;;   Joan Mayans  01/27/2022   Procedure: Joan Mayans;  Surgeon: Irving Copas., MD;  Location: [facility];  Service: Gastroenterology;;   TIBIA IM NAIL INSERTION Left 03/12/2021   Procedure: INTRAMEDULLARY (IM) NAIL TIBIAL;  Surgeon: Jessy Oto, MD;  Location: [facility];  Service: Orthopedics;  Laterality: Left;   TONSILLECTOMY     UPPER ESOPHAGEAL ENDOSCOPIC ULTRASOUND (EUS) N/A 08/13/2018   Procedure: UPPER ESOPHAGEAL ENDOSCOPIC ULTRASOUND (EUS);  Surgeon: Irving Copas., MD;  Location: [place];  Service: Gastroenterology;  Laterality: N/A;   UPPER ESOPHAGEAL ENDOSCOPIC ULTRASOUND (EUS) N/A 01/19/2019   Procedure: UPPER ESOPHAGEAL ENDOSCOPIC ULTRASOUND (EUS);  Surgeon: Irving Copas., MD;  Location: [facility];  Service: Gastroenterology;  Laterality: N/A;    Family History  Problem Relation Age of Onset   Other Mother        tachycardia.Marland KitchenMarland Kitchen?afib   Stroke Father        after hernia suegery   Prostate cancer Father    Other Father        global transient amnesia, unclear source   Atrial fibrillation Sister    Healthy Brother    Healthy Brother    Coronary artery disease Paternal Grandmother    Heart attack Paternal Grandmother 20   Brain cancer Maternal Grandfather        ?   Cancer Paternal Grandfather        ?   Breast cancer Maternal Grandmother       Social History   Socioeconomic History   Marital status: Married    Spouse name: Not on file    Number of children: 1   Years of education: 16   Highest education level: Not on file  Occupational History   Occupation: Mudlogger of business development  Tobacco Use   Smoking status: Never   Smokeless tobacco: Never  Vaping Use   Vaping Use: Never used  Substance and Sexual Activity   Alcohol use: Not Currently    Alcohol/week: 7.0 standard drinks of alcohol    Types: 7 Glasses of wine per week    Comment: wine   Drug use: No   Sexual activity: Not on file  Other Topics Concern   Not on file  Social History Narrative   Patient is married one stepson    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 not traveling   regular exercise , walking 4-5 times a week   Diet fruits and veggies, water   Alcohol at least several glasses of wine a week Stopped 08/2018   Never smoker, no drug use   Right handed   One story home  [place] in the am   Social Determinants of Health   Financial Resource Strain: Not on file  Food Insecurity: No Food Insecurity (01/22/2022)   Hunger Vital Sign    Worried About Running Out of Food in the Last Year: Never true    Ran Out of Food in the Last Year: Never true  Transportation Needs: No Transportation Needs (01/22/2022)   PRAPARE - Hydrologist (Medical): No    Lack of Transportation (Non-Medical): No  Physical Activity: Not on file  Stress: Not on file  Social Connections: Not on file    Allergies  Allergen Reactions   Ceftriaxone Hives    Immediate bright red itchy rash on bilateral legs after starting infusion.     Gluten Meal Other (See Comments)    Stomach distress   Zocor [Simvastatin] Other (See Comments)    Elevated lfts?     Current Outpatient Medications:    amoxicillin-clavulanate (AUGMENTIN) 875-125 MG tablet, Take 1 tablet by mouth 2 (two) times daily for 28 days., Disp: 56 tablet, Rfl: 0   aspirin EC 81 MG EC tablet, Take 1 tablet (81 mg  total) by mouth 2 (two) times daily. Swallow whole., Disp: 30 tablet, Rfl: 11   Blood Glucose Monitoring Suppl (ONE TOUCH ULTRA 2) w/Device KIT, Use to check blood sugars two times day, Disp: 1 each, Rfl: 0   Calcium Carb-Cholecalciferol (CALCIUM+D3 PO), Take 1 tablet by mouth daily., Disp: , Rfl:    cetirizine (ZYRTEC) 10 MG tablet, Take 10 mg by mouth at bedtime., Disp: , Rfl:    cholecalciferol (VITAMIN D3) 25 MCG (1000 UNIT) tablet, Take 1,000 Units by mouth daily., Disp: , Rfl:    cyanocobalamin 2000 MCG tablet, Take 2,000 mcg by mouth daily., Disp: , Rfl:    furosemide (LASIX) 20 MG tablet, Take 1 tablet (20 mg total) by mouth daily., Disp: 30 tablet, Rfl: 0   furosemide (LASIX) 40 MG tablet, Take 1 tablet (40 mg total) by mouth daily., Disp: 30 tablet, Rfl: 0   glucose blood (ONETOUCH ULTRA) test strip, CHECK BLOOD SUGAR TWICE DAILY AS DIRECTED, Disp: 100 strip, Rfl: 3   Insulin Glargine (BASAGLAR KWIKPEN) 100 UNIT/ML, Inject 30 Units into the skin daily., Disp: , Rfl:    insulin lispro (HUMALOG KWIKPEN) 100 UNIT/ML KwikPen, Inject 2-10 units into the skin before meals as needed for elevated BGL, PER SLIDING SCALE, Disp: , Rfl:    Insulin Pen Needle 32G X 4 MM MISC, 1 Device by Does not apply route in the morning, at noon, in the evening, and at bedtime., Disp: 400 each, Rfl: 3   Lancets (ONETOUCH ULTRASOFT) lancets, Use to check blood sugar two times a day., Disp: 200 each, Rfl: 3   meclizine (ANTIVERT) 25 MG tablet, Take 25 mg by mouth daily as needed for dizziness., Disp: , Rfl:    Melatonin 10 MG TABS, Take 20 mg by mouth at bedtime., Disp: , Rfl:    pantoprazole (PROTONIX) 40 MG tablet, TAKE 1 TABLET BY MOUTH TWICE DAILY BEFORE A MEAL, Disp: 180 tablet, Rfl: 1   spironolactone (ALDACTONE) 100 MG tablet, TAKE 1 TABLET(100 MG) BY MOUTH DAILY, Disp: 90 tablet, Rfl: 1   spironolactone (ALDACTONE) 50 MG tablet, Take 1 tablet (50 mg total) by mouth daily., Disp: 30 tablet, Rfl: 0   vancomycin  (VANCOCIN) 125 MG capsule, Take 1 capsule (125 mg total) by mouth daily for 28 days., Disp: 28 capsule, Rfl: 0   Review  of Systems  Constitutional:  Negative for activity change, appetite change, chills, diaphoresis, fatigue, fever and unexpected weight change.  HENT:  Negative for congestion, rhinorrhea, sinus pressure, sneezing, sore throat and trouble swallowing.   Eyes:  Negative for photophobia and visual disturbance.  Respiratory:  Negative for cough, chest tightness, shortness of breath, wheezing and stridor.   Cardiovascular:  Negative for chest pain, palpitations and leg swelling.  Gastrointestinal:  Negative for abdominal distention, abdominal pain, anal bleeding, blood in stool, constipation, diarrhea, nausea and vomiting.  Genitourinary:  Negative for difficulty urinating, dysuria, flank pain and hematuria.  Musculoskeletal:  Negative for arthralgias, back pain, gait problem, joint swelling and myalgias.  Skin:  Positive for wound. Negative for color change, pallor and rash.  Neurological:  Negative for dizziness, tremors, weakness and light-headedness.  Hematological:  Negative for adenopathy. Does not bruise/bleed easily.  Psychiatric/Behavioral:  Negative for agitation, behavioral problems, confusion, decreased concentration, dysphoric mood and sleep disturbance. The patient is nervous/anxious.        Objective:   Physical Exam Constitutional:      General: She is not in acute distress.    Appearance: Normal appearance. She is well-developed. She is not ill-appearing or diaphoretic.  HENT:     Head: Normocephalic and atraumatic.     Right Ear: Hearing and external ear normal.     Left Ear: Hearing and external ear normal.     Nose: No nasal deformity or rhinorrhea.  Eyes:     General: No scleral icterus.    Conjunctiva/sclera: Conjunctivae normal.     Right eye: Right conjunctiva is not injected.     Left eye: Left conjunctiva is not injected.     Pupils: Pupils are  equal, round, and reactive to light.  Neck:     Vascular: No JVD.  Cardiovascular:     Rate and Rhythm: Normal rate and regular rhythm.     Heart sounds: Normal heart sounds, S1 normal and S2 normal. No murmur heard.    No friction rub.  Abdominal:     General: Bowel sounds are normal. There is no distension.     Palpations: Abdomen is soft.     Tenderness: There is no abdominal tenderness.  Musculoskeletal:        General: Normal range of motion.     Right shoulder: Normal.     Left shoulder: Normal.     Cervical back: Normal range of motion and neck supple.     Right hip: Normal.     Left hip: Normal.     Right knee: Normal.     Left knee: Normal.  Lymphadenopathy:     Head:     Right side of head: No submandibular, preauricular or posterior auricular adenopathy.     Left side of head: No submandibular, preauricular or posterior auricular adenopathy.     Cervical: No cervical adenopathy.     Right cervical: No superficial or deep cervical adenopathy.    Left cervical: No superficial or deep cervical adenopathy.  Skin:    General: Skin is warm and dry.     Coloration: Skin is not pale.     Findings: No abrasion, bruising, ecchymosis, erythema, lesion or rash.     Nails: There is no clubbing.  Neurological:     General: No focal deficit present.     Mental Status: She is alert and oriented to person, place, and time.     Sensory: No sensory deficit.     Coordination:  Coordination normal.     Gait: Gait normal.  Psychiatric:        Attention and Perception: She is attentive.        Mood and Affect: Mood normal.        Speech: Speech normal.        Behavior: Behavior normal. Behavior is cooperative.        Thought Content: Thought content normal.        Judgment: Judgment normal.    Wounds area of induration superior to this palpable 02/22/2022:          Assessment & Plan:  Infected pseudocyst with abscess that communicated with skin status post  I&D:  Klebsiella was isolated which is sensitive to the Augmentin she is currently on.  I am going to have her extend Augmentin for an additional month while we await repeat CT scan.  As mentioned I think if there is still troubles trying to obtain this as an outpatient the best prudent thing to do that would waste the least amount of time would be to have her directly admitted to the hospital for IV placement and CT scan.  She of C. difficile colitis we will continue vancomycin prophylactically.  Hypertension: blood pressure up quite a bit today.  She says this is because yesterday she was driving back and forth to [facility] and here to obtain CT scan and IVs had infiltrated and was a very stressful experience.    Vaccine counseling recommended and offered flu and updated COVID vaccines which she refused

## 2022-02-22 NOTE — Telephone Encounter (Signed)
Pt made aware of Dr. Carlean Purl recommendations: Pt was ordered and scheduled for the IR paracentesis at [facility] tomorrow 02/23/2022 at 10:00 AM Pt to arrive at 9:30 AM. Pt made aware: Albumin Ordered:  Pt stated that she had an office visit with Dr. Tommy Medal this AM with ID. Pt stated that Dr. Tommy Medal stated that she needs an CT with IV contrast due to another large lump above wound on left back: Pt stated that he was recommending possible admission orders for pt to have CT scan done:

## 2022-02-23 ENCOUNTER — Other Ambulatory Visit (INDEPENDENT_AMBULATORY_CARE_PROVIDER_SITE_OTHER): Payer: 59

## 2022-02-23 ENCOUNTER — Ambulatory Visit (HOSPITAL_COMMUNITY)
Admission: RE | Admit: 2022-02-23 | Discharge: 2022-02-23 | Disposition: A | Discharge status: Home / Self Care | Payer: 59 | Source: Ambulatory Visit | Attending: Internal Medicine | Admitting: Internal Medicine

## 2022-02-23 ENCOUNTER — Encounter: Payer: 59 | Admitting: Family Medicine

## 2022-02-23 ENCOUNTER — Other Ambulatory Visit (HOSPITAL_COMMUNITY): Payer: 59

## 2022-02-23 DIAGNOSIS — K7031 Alcoholic cirrhosis of liver with ascites: Secondary | ICD-10-CM

## 2022-02-23 HISTORY — PX: IR PARACENTESIS: IMG2679

## 2022-02-23 LAB — BODY FLUID CELL COUNT WITH DIFFERENTIAL
Eos, Fluid: 0 %
Lymphs, Fluid: 34 %
Monocyte-Macrophage-Serous Fluid: 61 % (ref 50–90)
Neutrophil Count, Fluid: 5 % (ref 0–25)
Total Nucleated Cell Count, Fluid: 189 cu mm (ref 0–1000)

## 2022-02-23 LAB — BASIC METABOLIC PANEL
BUN: 15 mg/dL (ref 6–23)
CO2: 27 mEq/L (ref 19–32)
Calcium: 9.5 mg/dL (ref 8.4–10.5)
Chloride: 95 mEq/L — ABNORMAL LOW (ref 96–112)
Creatinine, Ser: 0.98 mg/dL (ref 0.40–1.20)
GFR: 63.36 mL/min (ref >60.00)
Glucose, Bld: 228 mg/dL — ABNORMAL HIGH (ref 70–99)
Potassium: 4.1 mEq/L (ref 3.5–5.1)
Sodium: 133 mEq/L — ABNORMAL LOW (ref 135–145)

## 2022-02-23 LAB — AMYLASE, PLEURAL OR PERITONEAL FLUID: Amylase, Fluid: 15 U/L

## 2022-02-23 LAB — GRAM STAIN

## 2022-02-23 LAB — ALBUMIN, PLEURAL OR PERITONEAL FLUID: Albumin, Fluid: <1.5 g/dL

## 2022-02-23 MED ORDER — LIDOCAINE HCL 1 % IJ SOLN
INTRAMUSCULAR | Status: AC
  Filled 2022-02-23: qty 20

## 2022-02-23 NOTE — Procedures (Signed)
PROCEDURE SUMMARY:  Successful ultrasound guided paracentesis from the right lower  quadrant.  Yielded 5 of straw fluid.  No immediate complications.  The patient tolerated the procedure well.   Specimen was sent for labs.  EBL < 79mL  The patient has required >/=2 paracenteses in a 30 day period and a screening evaluation by the [facility] has been arranged.

## 2022-02-25 NOTE — Telephone Encounter (Signed)
Please reschedule for CT abd/pelvis with IV contrast to follow-up pancreatic pseudocyst  CT should be done by next week

## 2022-02-26 ENCOUNTER — Other Ambulatory Visit: Payer: Self-pay

## 2022-02-26 DIAGNOSIS — K863 Pseudocyst of pancreas: Secondary | ICD-10-CM

## 2022-02-26 NOTE — Telephone Encounter (Addendum)
Need to try to get it approved by insurance for the hospital [facility]) due to difficult IV access  Going outside the [facility] system not good because we have to be able to compare the CT images from before

## 2022-02-26 NOTE — Telephone Encounter (Signed)
Please review notes below and please get CT scan approved by pt insurance company:

## 2022-02-26 NOTE — Telephone Encounter (Signed)
Pt was notified of Dr. Carlean Purl recommendations for the CT scan: Pt stated that [place] and Coming back on Monday:  Pt was scheduled at [facility] 03/06/2022 for CT abd/pelvis with IV contrast to follow-up pancreatic pseudocyst at 2:30 PM. Pt to arrive at 12:30 PM to drink IV contrast: Pt to remain NPO 4 hours prior starting at 10:30 AM: Pt made aware: Pt verbalized understanding with all questions answered.

## 2022-02-26 NOTE — Telephone Encounter (Signed)
CT abd/pelvis with IV contrast to follow-up pancreatic pseudocyst was ordered for [facility]: [facility] imaging was contacted to ensure that there would be [place] capability to starting IV for contrast due to the fact that pt was stuck 8 times last week with no success Loletha Grayer from [facility] imaging stated that she talk to her supervisor in regard to this pt and our request and was notified that they do not have the kits for the [place] to assist with the IV and that they will not see this pt:  Please advise if you would like for me to reach out to [place] or if you would like for me to somehow get this scheduled at [facility] due to her Poor IV access:

## 2022-02-27 ENCOUNTER — Ambulatory Visit: Payer: 59 | Admitting: Nurse Practitioner

## 2022-02-27 LAB — PATHOLOGIST SMEAR REVIEW

## 2022-02-28 ENCOUNTER — Other Ambulatory Visit (INDEPENDENT_AMBULATORY_CARE_PROVIDER_SITE_OTHER): Payer: 59

## 2022-02-28 DIAGNOSIS — E871 Hypo-osmolality and hyponatremia: Secondary | ICD-10-CM | POA: Diagnosis not present

## 2022-02-28 LAB — BASIC METABOLIC PANEL
BUN: 20 mg/dL (ref 6–23)
CO2: 30 mEq/L (ref 19–32)
Calcium: 9.5 mg/dL (ref 8.4–10.5)
Chloride: 94 mEq/L — ABNORMAL LOW (ref 96–112)
Creatinine, Ser: 0.83 mg/dL (ref 0.40–1.20)
GFR: 77.34 mL/min (ref >60.00)
Glucose, Bld: 212 mg/dL — ABNORMAL HIGH (ref 70–99)
Potassium: 3.7 mEq/L (ref 3.5–5.1)
Sodium: 134 mEq/L — ABNORMAL LOW (ref 135–145)

## 2022-02-28 LAB — CULTURE, BODY FLUID W GRAM STAIN -BOTTLE: Culture: NO GROWTH

## 2022-03-02 ENCOUNTER — Ambulatory Visit: Payer: Self-pay | Admitting: Infectious Diseases

## 2022-03-05 ENCOUNTER — Telehealth: Payer: Self-pay | Admitting: Gastroenterology

## 2022-03-05 NOTE — Telephone Encounter (Signed)
Pt  stated that she received a call from [facility] with radiology scheduling notifying her that her CT scan was not going to be completely covered at the [place] with Dorothea Ogle and he  stated that the CT scan will be covered but there will be a facility Charge that the pt will have to pay:  Dorothea Ogle stated that the pt could go to [facility] and get the CT scan done and that there will not be a facility charge there:   Secure chat sent to our authorization team to work on getting the authorization for the CT to be done at [facility]: Pt was made aware: Pt made aware that we will contact her as soon as they authorize it:

## 2022-03-05 NOTE — Telephone Encounter (Signed)
Patient called states she would like to speak with you regarding CT scan. States she is out of town but her CT is scheduled for tomorrow. Requesting a call back to know what she can do.

## 2022-03-06 ENCOUNTER — Ambulatory Visit (HOSPITAL_COMMUNITY): Payer: 59

## 2022-03-06 NOTE — Telephone Encounter (Signed)
OK - reason for hospital was IV access problems  We will see  She sees [facility] tomorrow

## 2022-03-06 NOTE — Telephone Encounter (Signed)
Per pre-cert coordinator, he spoke with insurance & patient can go to any free standing facility just not any hospitals. Patient has been scheduled for 03/09/22 at 10:00 am at [facility], and will need to arrive by 8:00 am to drink PO contrast. NPO 4 hours prior. Pt has been made aware, and verbalized all understanding.

## 2022-03-07 ENCOUNTER — Other Ambulatory Visit (INDEPENDENT_AMBULATORY_CARE_PROVIDER_SITE_OTHER): Payer: 59

## 2022-03-07 ENCOUNTER — Encounter: Payer: 59 | Admitting: Family Medicine

## 2022-03-07 DIAGNOSIS — E871 Hypo-osmolality and hyponatremia: Secondary | ICD-10-CM

## 2022-03-07 LAB — BASIC METABOLIC PANEL
BUN: 27 mg/dL — ABNORMAL HIGH (ref 6–23)
CO2: 31 mEq/L (ref 19–32)
Calcium: 10.2 mg/dL (ref 8.4–10.5)
Chloride: 96 mEq/L (ref 96–112)
Creatinine, Ser: 1.04 mg/dL (ref 0.40–1.20)
GFR: 58.99 mL/min — ABNORMAL LOW (ref >60.00)
Glucose, Bld: 224 mg/dL — ABNORMAL HIGH (ref 70–99)
Potassium: 4.4 mEq/L (ref 3.5–5.1)
Sodium: 131 mEq/L — ABNORMAL LOW (ref 135–145)

## 2022-03-08 ENCOUNTER — Ambulatory Visit: Payer: 59 | Admitting: Internal Medicine

## 2022-03-08 ENCOUNTER — Other Ambulatory Visit: Payer: 59

## 2022-03-09 ENCOUNTER — Ambulatory Visit: Payer: 59

## 2022-03-09 ENCOUNTER — Telehealth: Payer: Self-pay

## 2022-03-09 ENCOUNTER — Other Ambulatory Visit: Payer: 59

## 2022-03-09 MED ORDER — IOHEXOL 300 MG/ML  SOLN: 100.0000 mL | Freq: Once | INTRAMUSCULAR | Status: DC | PRN

## 2022-03-09 NOTE — Telephone Encounter (Signed)
Pt called in and notified us that [facility] was unsuccessful at getting an IV established for pt to do CT scan: Pt was stuck 4 times in which the vein blew each time: Pt stated that several different nurses had tried: Please advise

## 2022-03-09 NOTE — Telephone Encounter (Signed)
I guess I will have to get on the phone with her insurance company next week to try to get something approved at the hospital where there is greatedr success at getting IV placed (we hope).

## 2022-03-10 ENCOUNTER — Other Ambulatory Visit: Payer: Self-pay | Admitting: Internal Medicine

## 2022-03-10 DIAGNOSIS — K7031 Alcoholic cirrhosis of liver with ascites: Secondary | ICD-10-CM

## 2022-03-12 ENCOUNTER — Ambulatory Visit: Payer: 59 | Admitting: Infectious Disease

## 2022-03-12 NOTE — Telephone Encounter (Signed)
Noted  

## 2022-03-13 NOTE — Telephone Encounter (Signed)
Pt made aware to keep her follow up appointment with Tye Savoy NP on 03/15/2022  Pt stated that her Sodium was recently low at 131 on 03/07/2022 which was ordered by her PCP: PCP aware and has sent pt a message through my chart. Pt just wanted to notify our providers as well: Pt verbalized understanding with all questions answered.

## 2022-03-13 NOTE — Telephone Encounter (Signed)
She is seeing Nevin Bloodgood on 11/9 and we will regroup after that  Nevin Bloodgood - possibilities include CT no IV contrast, MRI no IV contrast and trying to get it done at hospital (insurance ok vs ED)  I will handle what needs to be done but want her seen in office for reassessment, first

## 2022-03-15 ENCOUNTER — Ambulatory Visit (INDEPENDENT_AMBULATORY_CARE_PROVIDER_SITE_OTHER): Payer: 59 | Admitting: Nurse Practitioner

## 2022-03-15 ENCOUNTER — Telehealth: Payer: Self-pay | Admitting: Internal Medicine

## 2022-03-15 ENCOUNTER — Other Ambulatory Visit (INDEPENDENT_AMBULATORY_CARE_PROVIDER_SITE_OTHER): Payer: 59

## 2022-03-15 ENCOUNTER — Encounter: Payer: Self-pay | Admitting: Nurse Practitioner

## 2022-03-15 VITALS — BP 130/70 | HR 78 | Ht 69.0 in | Wt 144.2 lb

## 2022-03-15 DIAGNOSIS — K7031 Alcoholic cirrhosis of liver with ascites: Secondary | ICD-10-CM

## 2022-03-15 DIAGNOSIS — K863 Pseudocyst of pancreas: Secondary | ICD-10-CM

## 2022-03-15 DIAGNOSIS — Z789 Other specified health status: Secondary | ICD-10-CM | POA: Insufficient documentation

## 2022-03-15 LAB — CBC
HCT: 39.5 % (ref 36.0–46.0)
Hemoglobin: 13.1 g/dL (ref 12.0–15.0)
MCHC: 33.1 g/dL (ref 30.0–36.0)
MCV: 85.3 fl (ref 78.0–100.0)
Platelets: 173 10*3/uL (ref 150.0–400.0)
RBC: 4.63 Mil/uL (ref 3.87–5.11)
RDW: 15.1 % (ref 11.5–15.5)
WBC: 5.3 10*3/uL (ref 4.0–10.5)

## 2022-03-15 LAB — COMPREHENSIVE METABOLIC PANEL
ALT: 19 U/L (ref 0–35)
AST: 52 U/L — ABNORMAL HIGH (ref 0–37)
Albumin: 4.3 g/dL (ref 3.5–5.2)
Alkaline Phosphatase: 295 U/L — ABNORMAL HIGH (ref 39–117)
BUN: 32 mg/dL — ABNORMAL HIGH (ref 6–23)
CO2: 29 mEq/L (ref 19–32)
Calcium: 10.3 mg/dL (ref 8.4–10.5)
Chloride: 92 mEq/L — ABNORMAL LOW (ref 96–112)
Creatinine, Ser: 0.99 mg/dL (ref 0.40–1.20)
GFR: 62.57 mL/min (ref >60.00)
Glucose, Bld: 379 mg/dL — ABNORMAL HIGH (ref 70–99)
Potassium: 5.1 mEq/L (ref 3.5–5.1)
Sodium: 128 mEq/L — ABNORMAL LOW (ref 135–145)
Total Bilirubin: 0.7 mg/dL (ref 0.2–1.2)
Total Protein: 8.8 g/dL — ABNORMAL HIGH (ref 6.0–8.3)

## 2022-03-15 LAB — PROTIME-INR
INR: 1.1 ratio — ABNORMAL HIGH (ref 0.8–1.0)
Prothrombin Time: 12.4 s (ref 9.6–13.1)

## 2022-03-15 MED ORDER — SPIRONOLACTONE 50 MG PO TABS: 50.0000 mg | ORAL_TABLET | Freq: Every day | ORAL | 1 refills | Status: DC

## 2022-03-15 MED ORDER — FUROSEMIDE 20 MG PO TABS: 20.0000 mg | ORAL_TABLET | Freq: Every day | ORAL | 1 refills | Status: DC

## 2022-03-15 NOTE — Patient Instructions (Signed)
Your provider has requested that you go to the basement level for lab work before leaving today. Press "B" on the elevator. The lab is located at the first door on the left as you exit the elevator.  _______________________________________________________  If you are age 59 or older, your body mass index should be between 23-30. Your Body mass index is 21.3 kg/m. If this is out of the aforementioned range listed, please consider follow up with your Primary Care Provider.  If you are age 76 or younger, your body mass index should be between 19-25. Your Body mass index is 21.3 kg/m. If this is out of the aformentioned range listed, please consider follow up with your Primary Care Provider.   ________________________________________________________  The [facility] providers would like to encourage you to use [facility] to communicate with providers for non-urgent requests or questions.  Due to long hold times on the telephone, sending your provider a message by [facility] may be a faster and more efficient way to get a response.  Please allow 48 business hours for a response.  Please remember that this is for non-urgent requests.  _______________________________________________________  Due to recent changes in healthcare laws, you may see the results of your imaging and laboratory studies on MyChart before your provider has had a chance to review them.  We understand that in some cases there may be results that are confusing or concerning to you. Not all laboratory results come back in the same time frame and the provider may be waiting for multiple results in order to interpret others.  Please give [place] 48 hours in order for your provider to thoroughly review all the results before contacting the office for clarification of your results.   Thank you for choosing me and [facility].  Willette Cluster NP

## 2022-03-15 NOTE — Telephone Encounter (Signed)
I called the patient to update her to her labs from today.  We are reducing her spironolactone to 50 mg and her furosemide to 20 mg daily.  She has worsening hyponatremia and rising BUN.  Prescriptions sent.  She informed me that Dr. Merri Ray from [facility] stopped her pantoprazole because of her C. difficile history.  She is also on 2 aspirin a day that we think started when she had extremity fractures and was DVT prevention.  Neither she nor I can think of another good indication and we have discontinued that today.   She still has not had a follow-up CT for her pancreatic pseudocyst and abscess.  She has had 3 attempts at outpatient CT but IVs could not be started.  She needs CT scan follow-up in the best place would be the hospital but her insurance company has not approved that.  I am going to try to make a peer to peer review so I can get them to approve a CT at the hospital where the IV team can try to start her IV.  I need to know phone number to call for a peer to peer for a CT at the hospital (CT abdomen and pelvis with contrast) in a patient with difficult IV access that requires IV insertion team available which is not available at the outpatient centers  We may need to go ahead and order the CT at the hospital to start this process.  I explained to the patient that I would be back Tuesday of next week and could try to make the phone calls next week.  Fortunately she is improved and not worse.

## 2022-03-15 NOTE — Progress Notes (Signed)
Chief Complaint: Hospital follow-up   Assessment &  Plan   #61 year old female with a very complicated medical history including cirrhosis with portal hypertension, chronic alcoholic pancreatitis complicated by pseudocyst.  Recently hospitalized with infected pseudocyst involving the retroperitoneal area.  Cyst could not be drained by EUS, status post IR drainage. Fluid + Klebsiella.  Rehospitalized with cellulitis/abscess to her left lower back at site previously drained by IR.  She remains on amoxicillin, followed by ID.  CMET, CBC, INR.  Depending on renal function we may need to decrease her diuretics.  Currently taking lasix 40 mg daily / aldactone 100 g daily. Continue low-sodium diet  We have been unable to get a follow up CT scan to reevaluate psedocyst. She has had multiple unsuccessful attempts. She went to [facility] in [place] at [place]. but they couldn't get IV access. Then she went to [facility] in [place] but lost IV access during the scan.  Repeat attempt that same day was unsuccessful. She went to [facility] in [place] and they were unable to get IV access after 5 tries  Her insurance will pay for the CT scan but not the "facility fee" for it to be done at the hospital . She will contact insurance company and find out about cost of the facility fee to see if it may be financially feasible for her to pay out-of-pocket. Otherwise will need to consider proceeding with a CT scan without IV contrast/   HPI   Mallory Stewart is a 61 yo female with a complicated PMH significant for chronic pancreatitis complicated by pseudocyst , cirrhosis complicated by portal hypertension with esophageal varices and gastric varices, DM 2 , hypertension . See PMH /PSH for additional history   Mallory Stewart has been in and out of the hospital with pancreatitis/pseudocyst complications.  She had an infected pseudocyst in August .  The pseudocyst was involving the retroperitoneal space.  EUS  attempted but drainage was not possible due to perisplenic varices .  She underwent IR drainage.  Cultures were positive for Klebsiella.    She was rehospitalized in September with progressive back pain and swelling in the area where she underwent aspiration by IR. There was cellulitis / abscess of the area General surgery did an I&D and placed a Penrose drain.  During that same admission she underwent an ERCP to rule out a PD leak. A plastic PD stent was placed.  A biliary narrowing in the lower third of the main duct was found.  The stricture was brushed. Cytology negative for malignancy  Very complicated medical history with Etoh cirrhosis and pancreatitis with large pseduocyst. She was admitted twice in September . Discharged on 9/25 after a prolonged stay for sepsis, peripancreatic / retroperitoneal infection. Still being followed by ED and taking Augmentin. Also on Vancomycin for C-diff prophylaxis.    Since hospital discharge a lot has transpired over the phone / [employer]. Basically we have been trying to get a follow up CT scan but have been unable to due to poor venous access.  Our office has been trying to get insurance approval to have scan done at the hospital but insurance won't cover facility fee.   Also since hospital discharge she had a LVP in October ( ~ 5 liters). Ascites has not reaccumulated. She is taking diuretics and following low salt diet. Weight stable. Last labs on 11/1 showed Na of 131,BUn 27, normal Cr.   Mallory Stewart feels and looks good  Mallory Stewart did establish care  with [facility]. They asked to return in 4 months and to work on improving her nutrition in the interim   Labs:     Latest Ref Rng & Units 01/28/2022    5:16 AM 01/27/2022    5:23 AM 01/26/2022    4:51 AM  CBC  WBC 4.0 - 10.5 K/uL 6.5  7.8  4.5   Hemoglobin 12.0 - 15.0 g/dL 9.3  9.6  9.7   Hematocrit 36.0 - 46.0 % 28.5  29.7  29.5   Platelets 150 - 400 K/uL 211  247  222        Latest Ref Rng & Units  01/29/2022    4:24 AM 01/28/2022    5:16 AM 01/27/2022    5:23 AM  Hepatic Function  Total Protein 6.5 - 8.1 g/dL 5.8  5.3  5.7   Albumin 3.5 - 5.0 g/dL 2.4  2.2  2.2   AST 15 - 41 U/L 81  200  17   ALT 0 - 44 U/L 60  85  11   Alk Phosphatase 38 - 126 U/L 317  395  83   Total Bilirubin 0.3 - 1.2 mg/dL 0.5  0.6  0.3      Past Medical History:  Diagnosis Date   Alcohol-induced chronic pancreatitis (HCC)    Alcoholic cirrhosis (HCC)    B12 deficiency    Concussion    DDD (degenerative disc disease), cervical    Diabetes ([place])    DKA, type 2 ([place])    Gastric outlet obstruction 08/12/2018   Hypertension    Neuropathy    Pancreatic pseudocyst/cyst 05/06/2013   Seasonal allergies     Past Surgical History:  Procedure Laterality Date   ANKLE ARTHROSCOPY Right 12/20/2020   Procedure: RIGHT ANKLE ARTHROSCOPIC DEBRIDEMENT;  Surgeon: Newt Minion, MD;  Location: [place];  Service: Orthopedics;  Laterality: Right;   BILIARY BRUSHING  01/27/2022   Procedure: BILIARY BRUSHING;  Surgeon: Rush Landmark Telford Nab., MD;  Location: [facility];  Service: Gastroenterology;;   BIOPSY  01/19/2019   Procedure: BIOPSY;  Surgeon: Irving Copas., MD;  Location: [place];  Service: Gastroenterology;;   CHOLECYSTECTOMY N/A 07/28/2016   Procedure: LAPAROSCOPIC CHOLECYSTECTOMY WITH INTRAOPERATIVE CHOLANGIOGRAM;  Surgeon: Mickeal Skinner, MD;  Location: [place];  Service: General;  Laterality: N/A;   ERCP N/A 01/27/2022   Procedure: ENDOSCOPIC RETROGRADE CHOLANGIOPANCREATOGRAPHY (ERCP);  Surgeon: Irving Copas., MD;  Location: [facility];  Service: Gastroenterology;  Laterality: N/A;   ESOPHAGOGASTRODUODENOSCOPY (EGD) WITH PROPOFOL N/A 08/13/2018   Procedure: ESOPHAGOGASTRODUODENOSCOPY (EGD) WITH PROPOFOL;  Surgeon: Rush Landmark Telford Nab., MD;  Location: [place];  Service: Gastroenterology;  Laterality: N/A;   ESOPHAGOGASTRODUODENOSCOPY (EGD) WITH PROPOFOL N/A  01/19/2019   Procedure: ESOPHAGOGASTRODUODENOSCOPY (EGD) WITH PROPOFOL;  Surgeon: Rush Landmark Telford Nab., MD;  Location: [place];  Service: Gastroenterology;  Laterality: N/A;   ESOPHAGOGASTRODUODENOSCOPY (EGD) WITH PROPOFOL N/A 12/28/2021   Procedure: ESOPHAGOGASTRODUODENOSCOPY (EGD) WITH PROPOFOL;  Surgeon: Rush Landmark Telford Nab., MD;  [facility]: [facility];  Service: Gastroenterology;  Laterality: N/A;   EUS N/A 12/28/2021   Procedure: UPPER ENDOSCOPIC ULTRASOUND (EUS) LINEAR;  Surgeon: Irving Copas., MD;  Location: [facility];  Service: Gastroenterology;  Laterality: N/A;   INCISION AND DRAINAGE ABSCESS N/A 01/25/2022   Procedure: INCISION AND DRAINAGE BACK ABSCESS;  Surgeon: Kieth Brightly, Arta Bruce, MD;  Location: [facility];  Service: General;  Laterality: N/A;  90 L DOW   IR PARACENTESIS  08/08/2020   IR PARACENTESIS  03/13/2021  IR PARACENTESIS  02/23/2022   IR US GUIDE BX ASP/DRAIN  01/23/2022   LEEP  1990's   ORIF FOOT FRACTURE  09/2008   L 5th metatarsal   PANCREATIC STENT PLACEMENT  01/27/2022   Procedure: PANCREATIC STENT PLACEMENT;  Surgeon: Irving Copas., MD;  Location: [facility];  Service: Gastroenterology;;   REFRACTIVE SURGERY  2000   REMOVAL OF STONES  01/27/2022   Procedure: REMOVAL OF STONES;  Surgeon: Irving Copas., MD;  Location: [facility];  Service: Gastroenterology;;   Joan Mayans  01/27/2022   Procedure: Joan Mayans;  Surgeon: Irving Copas., MD;  Location: [facility];  Service: Gastroenterology;;   TIBIA IM NAIL INSERTION Left 03/12/2021   Procedure: INTRAMEDULLARY (IM) NAIL TIBIAL;  Surgeon: Jessy Oto, MD;  Location: [facility];  Service: Orthopedics;  Laterality: Left;   TONSILLECTOMY     UPPER ESOPHAGEAL ENDOSCOPIC ULTRASOUND (EUS) N/A 08/13/2018   Procedure: UPPER ESOPHAGEAL ENDOSCOPIC ULTRASOUND (EUS);  Surgeon: Irving Copas., MD;  Location: [place];  Service: Gastroenterology;  Laterality: N/A;    UPPER ESOPHAGEAL ENDOSCOPIC ULTRASOUND (EUS) N/A 01/19/2019   Procedure: UPPER ESOPHAGEAL ENDOSCOPIC ULTRASOUND (EUS);  Surgeon: Irving Copas., MD;  Location: [place];  Service: Gastroenterology;  Laterality: N/A;    Current Medications, Allergies, Family History and Social History were reviewed in [employer] record.     Current Outpatient Medications  Medication Sig Dispense Refill   amoxicillin-clavulanate (AUGMENTIN) 875-125 MG tablet Take 1 tablet by mouth 2 (two) times daily. 60 tablet 1   aspirin EC 81 MG EC tablet Take 1 tablet (81 mg total) by mouth 2 (two) times daily. Swallow whole. 30 tablet 11   Blood Glucose Monitoring Suppl (ONE TOUCH ULTRA 2) w/Device KIT Use to check blood sugars two times day 1 each 0   Calcium Carb-Cholecalciferol (CALCIUM+D3 PO) Take 1 tablet by mouth daily.     cetirizine (ZYRTEC) 10 MG tablet Take 10 mg by mouth at bedtime.     cholecalciferol (VITAMIN D3) 25 MCG (1000 UNIT) tablet Take 1,000 Units by mouth daily.     furosemide (LASIX) 40 MG tablet TAKE 1 TABLET(40 MG) BY MOUTH DAILY 30 tablet 2   glucose blood (ONETOUCH ULTRA) test strip CHECK BLOOD SUGAR TWICE DAILY AS DIRECTED 100 strip 3   Insulin Glargine (BASAGLAR KWIKPEN) 100 UNIT/ML Inject 30 Units into the skin daily.     insulin lispro (HUMALOG KWIKPEN) 100 UNIT/ML KwikPen Inject 2-10 units into the skin before meals as needed for elevated BGL, PER SLIDING SCALE     Insulin Pen Needle 32G X 4 MM MISC 1 Device by Does not apply route in the morning, at noon, in the evening, and at bedtime. 400 each 3   Lancets (ONETOUCH ULTRASOFT) lancets Use to check blood sugar two times a day. 200 each 3   meclizine (ANTIVERT) 25 MG tablet Take 25 mg by mouth daily as needed for dizziness.     pantoprazole (PROTONIX) 40 MG tablet TAKE 1 TABLET BY MOUTH TWICE DAILY BEFORE A MEAL 180 tablet 1   spironolactone (ALDACTONE) 50 MG tablet Take 1 tablet (50 mg total) by mouth  daily. 30 tablet 0   vancomycin (VANCOCIN) 125 MG capsule Take 1 capsule (125 mg total) by mouth daily. 30 capsule 1   cyanocobalamin 2000 MCG tablet Take 2,000 mcg by mouth daily. (Patient not taking: Reported on 03/15/2022)     Melatonin 10 MG TABS Take 20 mg by mouth at bedtime. (Patient not  taking: Reported on 03/15/2022)     spironolactone (ALDACTONE) 100 MG tablet TAKE 1 TABLET(100 MG) BY MOUTH DAILY (Patient not taking: Reported on 03/15/2022) 90 tablet 1   No current facility-administered medications for this visit.    Review of Systems: No chest pain. No shortness of breath. No urinary complaints.    Physical Exam  Wt Readings from Last 3 Encounters:  03/15/22 144 lb 4 oz (65.4 kg)  02/22/22 154 lb (69.9 kg)  01/22/22 155 lb 3.3 oz (70.4 kg)    BP 130/70   Pulse 78   Ht _0  (1.753 m)   Wt 144 lb 4 oz (65.4 kg)   BMI 21.30 kg/m  Constitutional:  Thin female in no acute distress. Psychiatric: Pleasant. Normal mood and affect. Behavior is normal. EENT: Pupils normal.  Conjunctivae are normal. No scleral icterus. Neck supple.  Cardiovascular: Normal rate, regular rhythm. Trace bilateral pedal edema Pulmonary/chest: Effort normal and breath sounds normal. No wheezing, rales or rhonchi. Abdominal: Soft, nondistended, nontender. Bowel sounds active throughout. There are no masses palpable. No hepatomegaly. Neurological: Alert and oriented to person place and time. Skin: Skin is warm and dry. No rashes noted.  Tye Savoy, NP  03/15/2022, 10:35 AM

## 2022-03-16 ENCOUNTER — Other Ambulatory Visit: Payer: Self-pay

## 2022-03-16 ENCOUNTER — Other Ambulatory Visit (HOSPITAL_COMMUNITY)
Admission: RE | Admit: 2022-03-16 | Discharge: 2022-03-16 | Disposition: A | Discharge status: Home / Self Care | Payer: 59 | Source: Ambulatory Visit | Attending: Family Medicine | Admitting: Family Medicine

## 2022-03-16 ENCOUNTER — Encounter: Payer: Self-pay | Admitting: Family Medicine

## 2022-03-16 ENCOUNTER — Ambulatory Visit (INDEPENDENT_AMBULATORY_CARE_PROVIDER_SITE_OTHER): Payer: 59 | Admitting: Family Medicine

## 2022-03-16 VITALS — BP 120/80 | HR 93 | Temp 98.2°F | Ht 68.25 in | Wt 141.2 lb

## 2022-03-16 DIAGNOSIS — Z794 Long term (current) use of insulin: Secondary | ICD-10-CM

## 2022-03-16 DIAGNOSIS — Z1211 Encounter for screening for malignant neoplasm of colon: Secondary | ICD-10-CM | POA: Diagnosis not present

## 2022-03-16 DIAGNOSIS — E1159 Type 2 diabetes mellitus with other circulatory complications: Secondary | ICD-10-CM

## 2022-03-16 DIAGNOSIS — Z789 Other specified health status: Secondary | ICD-10-CM

## 2022-03-16 DIAGNOSIS — Z124 Encounter for screening for malignant neoplasm of cervix: Secondary | ICD-10-CM

## 2022-03-16 DIAGNOSIS — I10 Essential (primary) hypertension: Secondary | ICD-10-CM

## 2022-03-16 DIAGNOSIS — L03312 Cellulitis of back [any part except buttock]: Secondary | ICD-10-CM

## 2022-03-16 DIAGNOSIS — K863 Pseudocyst of pancreas: Secondary | ICD-10-CM

## 2022-03-16 DIAGNOSIS — Z0000 Encounter for general adult medical examination without abnormal findings: Secondary | ICD-10-CM

## 2022-03-16 DIAGNOSIS — K860 Alcohol-induced chronic pancreatitis: Secondary | ICD-10-CM

## 2022-03-16 DIAGNOSIS — F411 Generalized anxiety disorder: Secondary | ICD-10-CM

## 2022-03-16 DIAGNOSIS — E7800 Pure hypercholesterolemia, unspecified: Secondary | ICD-10-CM | POA: Diagnosis not present

## 2022-03-16 DIAGNOSIS — K7031 Alcoholic cirrhosis of liver with ascites: Secondary | ICD-10-CM

## 2022-03-16 MED ORDER — ALPRAZOLAM 0.25 MG PO TABS: 0.2500 mg | ORAL_TABLET | Freq: Every day | ORAL | 0 refills | Status: DC

## 2022-03-16 NOTE — Patient Instructions (Addendum)
Return for flu vaccine when off antibiotics.  Pick up stool test in lab  IFOB next week.  Please call the location of your choice from the menu below to schedule your Mammogram and/or Bone Density appointment.     [facility]   [facility]                      Phone:  [phone] [address]                               [place], [place] [address]                                                             Services: Traditional and 3D Mammogram, Bone Density   [facility]                 Phone: [phone] [address]                                                       [place], [place] [address]    Service: Bone Density ONLY   *this site does NOT perform mammograms  [facility]                        Phone:  [phone] [address]                                  [place], [place] [address]                                            Services:  3D Mammogram and Bone Density

## 2022-03-16 NOTE — Progress Notes (Signed)
Patient ID: Mallory Stewart, female    DOB: Sep 30, 1960, 61 y.o.   MRN: [id number]  This visit was conducted in person.  BP 120/80   Pulse 93   Temp 98.2 F (36.8 C) (Oral)   Ht 5' 8.25" (1.734 m)   Wt 141 lb 4 oz (64.1 kg)   SpO2 98%   BMI 21.32 kg/m    CC:  Chief Complaint  Patient presents with   Annual Exam    Subjective:   HPI: Mallory Stewart is a 61 y.o. female presenting on 03/16/2022 for Annual Exam  Patient with very complicated recent medical history including chronic alcoholic pancreatitis, alcoholic cirrhosis of the liver, infected pancreatic pseudocyst, ascites and hyponatremia.  Cellulitis from pseudocyst improved.  Recent OV with ID... has extended antibiotics for 1 month Alcohol use in remission I reviewed her recent GI and specialty office visits.  Diabetes is followed by her endocrinologist... Dr. Kelton Pillar.  FBS 131 Lab Results  Component Value Date   HGBA1C 6.0 (A) 01/17/2022   Reviewed labs done from recent hospitalization and specialty visits.  No lipid panel checked in the last year. Lab Results  Component Value Date   CHOL 168 06/16/2020   HDL 18.10 (L) 06/16/2020   LDLCALC 82 10/16/2018   LDLDIRECT 129.0 06/16/2020   TRIG 236.0 (H) 06/16/2020   CHOLHDL 9 06/16/2020   Hypertension:  Blood pressure at goal on Lasix 40 mg daily, spironolactone 100 mg daily... sodium has decreased... she has contacted GI office.. likely will adjust diuretics.  Call from GI while in office .Marland Kitchen Plan to decrease spironolactone to 50 mg. BP Readings from Last 3 Encounters:  03/16/22 120/80  03/15/22 130/70  02/22/22 (!) 165/107  Using medication without problems or lightheadedness:  none Chest pain with exertion: none Edema:none Short of breath: none Average home BPs: Other issues:  Not fluid overloaded today. Wt Readings from Last 3 Encounters:  03/16/22 141 lb 4 oz (64.1 kg)  03/15/22 144 lb 4 oz (65.4 kg)  02/22/22 154 lb (69.9 kg)      Diet: heart healthy as able Exercise: occ    3 week ago stepped on a rock... now lesion still present. No pain.   Hx peripheral neuropathy.    GAD , continued issues.  Using alprazolam 2-3 a week.   Relevant past medical, surgical, family and social history reviewed and updated as indicated. Interim medical history since our last visit reviewed. Allergies and medications reviewed and updated. Outpatient Medications Prior to Visit  Medication Sig Dispense Refill   amoxicillin-clavulanate (AUGMENTIN) 875-125 MG tablet Take 1 tablet by mouth 2 (two) times daily. 60 tablet 1   Blood Glucose Monitoring Suppl (ONE TOUCH ULTRA 2) w/Device KIT Use to check blood sugars two times day 1 each 0   Calcium Carb-Cholecalciferol (CALCIUM+D3 PO) Take 1 tablet by mouth daily.     cetirizine (ZYRTEC) 10 MG tablet Take 10 mg by mouth at bedtime.     cholecalciferol (VITAMIN D3) 25 MCG (1000 UNIT) tablet Take 1,000 Units by mouth daily.     cyanocobalamin 2000 MCG tablet Take 2,000 mcg by mouth daily. (Patient not taking: Reported on 03/15/2022)     furosemide (LASIX) 20 MG tablet Take 1 tablet (20 mg total) by mouth daily. 90 tablet 1   glucose blood (ONETOUCH ULTRA) test strip CHECK BLOOD SUGAR TWICE DAILY AS DIRECTED 100 strip 3   Insulin Glargine (BASAGLAR KWIKPEN) 100 UNIT/ML Inject 30 Units into the skin daily.  insulin lispro (HUMALOG KWIKPEN) 100 UNIT/ML KwikPen Inject 2-10 units into the skin before meals as needed for elevated BGL, PER SLIDING SCALE     Insulin Pen Needle 32G X 4 MM MISC 1 Device by Does not apply route in the morning, at noon, in the evening, and at bedtime. 400 each 3   Lancets (ONETOUCH ULTRASOFT) lancets Use to check blood sugar two times a day. 200 each 3   meclizine (ANTIVERT) 25 MG tablet Take 25 mg by mouth daily as needed for dizziness.     Melatonin 10 MG TABS Take 20 mg by mouth at bedtime. (Patient not taking: Reported on 03/15/2022)     spironolactone  (ALDACTONE) 50 MG tablet Take 1 tablet (50 mg total) by mouth daily. 90 tablet 1   vancomycin (VANCOCIN) 125 MG capsule Take 1 capsule (125 mg total) by mouth daily. 30 capsule 1   No facility-administered medications prior to visit.     Per HPI unless specifically indicated in ROS section below Review of Systems  Constitutional:  Negative for fatigue and fever.  HENT:  Negative for congestion.   Eyes:  Negative for pain.  Respiratory:  Negative for cough and shortness of breath.   Cardiovascular:  Negative for chest pain, palpitations and leg swelling.  Gastrointestinal:  Negative for abdominal pain.  Genitourinary:  Negative for dysuria and vaginal bleeding.  Musculoskeletal:  Negative for back pain.  Neurological:  Negative for syncope, light-headedness and headaches.  Psychiatric/Behavioral:  Negative for dysphoric mood.    Objective:  BP 120/80   Pulse 93   Temp 98.2 F (36.8 C) (Oral)   Ht 5' 8.25" (1.734 m)   Wt 141 lb 4 oz (64.1 kg)   SpO2 98%   BMI 21.32 kg/m   Wt Readings from Last 3 Encounters:  03/16/22 141 lb 4 oz (64.1 kg)  03/15/22 144 lb 4 oz (65.4 kg)  02/22/22 154 lb (69.9 kg)      Physical Exam Constitutional:      General: She is not in acute distress.    Appearance: Normal appearance. She is well-developed. She is cachectic. She is not ill-appearing or toxic-appearing.  HENT:     Head: Normocephalic.     Right Ear: Hearing, tympanic membrane, ear canal and external ear normal. Tympanic membrane is not erythematous, retracted or bulging.     Left Ear: Hearing, tympanic membrane, ear canal and external ear normal. Tympanic membrane is not erythematous, retracted or bulging.     Nose: No mucosal edema or rhinorrhea.     Right Sinus: No maxillary sinus tenderness or frontal sinus tenderness.     Left Sinus: No maxillary sinus tenderness or frontal sinus tenderness.     Mouth/Throat:     Pharynx: Uvula midline.  Eyes:     General: Lids are normal. Lids  are everted, no foreign bodies appreciated.     Conjunctiva/sclera: Conjunctivae normal.     Pupils: Pupils are equal, round, and reactive to light.  Neck:     Thyroid: No thyroid mass or thyromegaly.     Vascular: No carotid bruit.     Trachea: Trachea normal.  Cardiovascular:     Rate and Rhythm: Normal rate and regular rhythm.     Pulses: Normal pulses.     Heart sounds: Normal heart sounds, S1 normal and S2 normal. No murmur heard.    No friction rub. No gallop.  Pulmonary:     Effort: Pulmonary effort is normal. No tachypnea or  respiratory distress.     Breath sounds: Normal breath sounds. No decreased breath sounds, wheezing, rhonchi or rales.  Abdominal:     General: Bowel sounds are normal.     Palpations: Abdomen is soft.     Tenderness: There is no abdominal tenderness.  Musculoskeletal:     Cervical back: Normal range of motion and neck supple.  Skin:    General: Skin is warm and dry.     Findings: No rash.  Neurological:     Mental Status: She is alert.  Psychiatric:        Mood and Affect: Mood is not anxious or depressed.        Speech: Speech normal.        Behavior: Behavior normal. Behavior is cooperative.        Thought Content: Thought content normal.        Judgment: Judgment normal.       Results for orders placed or performed in visit on 03/15/22  INR/PT  Result Value Ref Range   INR 1.1 (H) 0.8 - 1.0 ratio   Prothrombin Time 12.4 9.6 - 13.1 sec  CBC  Result Value Ref Range   WBC 5.3 4.0 - 10.5 K/uL   RBC 4.63 3.87 - 5.11 Mil/uL   Platelets 173.0 150.0 - 400.0 K/uL   Hemoglobin 13.1 12.0 - 15.0 g/dL   HCT 39.5 36.0 - 46.0 %   MCV 85.3 78.0 - 100.0 fl   MCHC 33.1 30.0 - 36.0 g/dL   RDW 15.1 11.5 - 15.5 %  Comp Met (CMET)  Result Value Ref Range   Sodium 128 (L) 135 - 145 mEq/L   Potassium 5.1 3.5 - 5.1 mEq/L   Chloride 92 (L) 96 - 112 mEq/L   CO2 29 19 - 32 mEq/L   Glucose, Bld 379 (H) 70 - 99 mg/dL   BUN 32 (H) 6 - 23 mg/dL   Creatinine,  Ser 0.99 0.40 - 1.20 mg/dL   Total Bilirubin 0.7 0.2 - 1.2 mg/dL   Alkaline Phosphatase 295 (H) 39 - 117 U/L   AST 52 (H) 0 - 37 U/L   ALT 19 0 - 35 U/L   Total Protein 8.8 (H) 6.0 - 8.3 g/dL   Albumin 4.3 3.5 - 5.2 g/dL   GFR 62.57 >60.00 mL/min   Calcium 10.3 8.4 - 10.5 mg/dL   *Note: Due to a large number of results and/or encounters for the requested time period, some results have not been displayed. A complete set of results can be found in Results Review.     COVID 19 screen:  No recent travel or known exposure to COVID19 The patient denies respiratory symptoms of COVID 19 at this time. The importance of social distancing was discussed today.   Assessment and Plan   The patient's preventative maintenance and recommended screening tests for an annual wellness exam were reviewed in full today. Brought up to date unless services declined.  Counselled on the importance of diet, exercise, and its role in overall health and mortality. The patient's FH and SH was reviewed, including their home life, tobacco status, and drug and alcohol status.   Vaccines: Due for flu ( will hold off for now as on antibiotics), TD.. hold off for now. S/P COVID x 3, Uptodate with Shingrix and PNA Pap/DVE:  Hx of abnormal pap, 09/2015 normal repeat q5 years... Overdue Mammo:  DUE Colon: DUE, no family history. Smoking Status: none  DEXA normal 10/2020 ETOH/ drug  use:  None/none  Hep C: done  HIV screen:  done In microalbumin due  Problem List Items Addressed This Visit     Chronic alcoholic pancreatitis ([place]) (Chronic)    Followed by GI      Cellulitis of back    Acute, now resolved following hospitalization.  Followed by ID on extended antibiotics      Colon cancer screening   Relevant Orders   Fecal occult blood, imunochemical   Diabetes mellitus with circulatory complication, HTN (HCC)    Chronic, followed by endocrinology      GAD (generalized anxiety disorder)    Chronic,  moderate control with recent health issues  Using alprazolam 2-3 a week.      HTN (hypertension)    Chronic, well-controlled  Lasix 40 mg daily, spironolactone 100 mg daily. Given low sodium GI recommended decreasing spironolactone to 50 mg daily.      Other Visit Diagnoses     Routine general medical examination at a health care facility    -  Primary   Hypercholesterolemia       Relevant Orders   Lipid panel (Completed)   Cervical cancer screening       Relevant Orders   Cytology - PAP([place]) (Completed)        Eliezer Lofts, MD

## 2022-03-16 NOTE — Telephone Encounter (Signed)
Pt was made aware of Dr. Leone Payor recommendations and prescription has been sent: Pt stated that she will pick up prescription today:  Pt has been scheduled for a CT scan on 03/26/2022 at 4:30 PM at [facility], Pt to arrive at 4:15 PM. Pt to remain NPO 4 hours prior. Pt made aware   Staff message sent to our  Authorization team to assist with getting CT scan approved at the Hospital  with the understanding that Dr. Leone Payor may have to do a peer to peer for the Facility Fee

## 2022-03-16 NOTE — Telephone Encounter (Signed)
Patient called stated her sodium levels have  decreased again seeking further advise.

## 2022-03-19 ENCOUNTER — Ambulatory Visit (INDEPENDENT_AMBULATORY_CARE_PROVIDER_SITE_OTHER): Payer: 59 | Admitting: Infectious Disease

## 2022-03-19 ENCOUNTER — Ambulatory Visit: Payer: 59

## 2022-03-19 ENCOUNTER — Encounter: Payer: Self-pay | Admitting: Infectious Disease

## 2022-03-19 ENCOUNTER — Other Ambulatory Visit: Payer: Self-pay

## 2022-03-19 VITALS — BP 132/92 | HR 100 | Temp 97.5°F | Wt 145.0 lb

## 2022-03-19 DIAGNOSIS — K860 Alcohol-induced chronic pancreatitis: Secondary | ICD-10-CM

## 2022-03-19 DIAGNOSIS — K863 Pseudocyst of pancreas: Secondary | ICD-10-CM | POA: Diagnosis not present

## 2022-03-19 DIAGNOSIS — K7031 Alcoholic cirrhosis of liver with ascites: Secondary | ICD-10-CM | POA: Diagnosis not present

## 2022-03-19 LAB — CYTOLOGY - PAP
Comment: NEGATIVE
Diagnosis: NEGATIVE
High risk HPV: NEGATIVE

## 2022-03-19 NOTE — Telephone Encounter (Signed)
Pt chart reviewed. Noted that pt CT scan had been canceled.  Pt CT scan was reordered for 03/26/2022 at 5:30 PM at [facility]; Pt to arrive at 5:15 PM ; No thing to eat or drink 4 hours prior; Pt made aware Pt verbalized understanding with all questions answered.   [facility] through pt insurance was called to discuss setting up a  peer to peer  with Dr. Leone Payor: Spoke with Aracely H with the [facility] and explained everything and how the pt has tried multiple times at out pt centers to receive the CT scan but was unsuccessful due to no one being able to successfully start an IV for the pt to receive the IV contrast: Pt has been stuck numerous times at several different locations: Notified of why this pt needs to be done at the Hospital so that an IV can be started with an Ultrasound:  Aracely H stated that the pt  has been approved for the pt to have the CT scan done at [facility] and that the Hospital facility fee has been approved as well:  Requested a faxed documented confirming this information to be faxed to   [phone]

## 2022-03-19 NOTE — Progress Notes (Signed)
Subjective:  Chief complaint  area that had been more indurated where she had fistula  Patient ID: Mallory Stewart, female    DOB: 03/12/1961, 61 y.o.   MRN: [id number]  HPI  61 y.o. female with  alcoholic liver cirrhosis, alcohol induced chronic pancreatitis with pseudocyst, C diff who presented  with left lower back redness, swelling and redness/drainage for 1 day with chills. Complicated with infected pseudocyst, posterior abdominal wall abscess with draining wound at the left lower back. She is s/p paracentesis, IR aspiration of intraabdominal fluid ( Kleb pneumo) and I and D of Left lower back abscess.  She was discharged on oral Augmentin she which she remains on as well as vancomycin prophylactically to prevent C. difficile colitis which she had once 2 months ago.  She is continue have drainage from the open wound on her back and there is also an area of induration that seems to be worsening as well.  She was set up for CT scan in [place] and also here in [place] but apparently no one could obtain IV access to execute the CT scan she says that literally 6 different people tried to put an IV in.  She said Dr. Carlean Purl was  working on getting this CAT scan done.  Certainly I think if this is continue to be a problem the most prudent thing would be to have her directly admitted to the hospital and have someone place an IV versus even a central line to obtain the study and to see if she needs further surgical attention.  As I last saw her there were many attempts to obtain a CT scan and no one at radiology's been able to access her veins for IV.  She is only Dr. Carlean Purl is trying to have the procedure done as an inpatient so that sound can be used to help with guidance of a IV for the scan to be done.  She did have a recent paracentesis that did not look consistent with infection she has not had fevers chills nausea malaise and has tolerated Augmentin she has not had recurrence of her of  her C. difficile colitis.    Past Medical History:  Diagnosis Date   Alcohol-induced chronic pancreatitis (HCC)    Alcoholic cirrhosis ([place])    B12 deficiency    Concussion    DDD (degenerative disc disease), cervical    Diabetes ([place])    DKA, type 2 ([place])    Gastric outlet obstruction 08/12/2018   Hypertension    Neuropathy    Pancreatic pseudocyst/cyst 05/06/2013   Seasonal allergies     Past Surgical History:  Procedure Laterality Date   ANKLE ARTHROSCOPY Right 12/20/2020   Procedure: RIGHT ANKLE ARTHROSCOPIC DEBRIDEMENT;  Surgeon: Newt Minion, MD;  Location: [place];  Service: Orthopedics;  Laterality: Right;   BILIARY BRUSHING  01/27/2022   Procedure: BILIARY BRUSHING;  Surgeon: Rush Landmark Telford Nab., MD;  Location[facility];  Service: Gastroenterology;;   BIOPSY  01/19/2019   Procedure: BIOPSY;  Surgeon: Irving Copas., MD;  Location: [place];  Service: Gastroenterology;;   CHOLECYSTECTOMY N/A 07/28/2016   Procedure: LAPAROSCOPIC CHOLECYSTECTOMY WITH INTRAOPERATIVE CHOLANGIOGRAM;  Surgeon: Mickeal Skinner, MD;  Location: [place];  Service: General;  Laterality: N/A;   ERCP N/A 01/27/2022   Procedure: ENDOSCOPIC RETROGRADE CHOLANGIOPANCREATOGRAPHY (ERCP);  Surgeon: Irving Copas., MD;  Location: [facility];  Service: Gastroenterology;  Laterality: N/A;   ESOPHAGOGASTRODUODENOSCOPY (EGD) WITH PROPOFOL N/A 08/13/2018   Procedure:  ESOPHAGOGASTRODUODENOSCOPY (EGD) WITH PROPOFOL;  Surgeon: Mansouraty, Gabriel Jr., MD;  [facility];  Service: Gastroenterology;  Laterality: N/A;   ESOPHAGOGASTRODUODENOSCOPY (EGD) WITH PROPOFOL N/A 01/19/2019   Procedure: ESOPHAGOGASTRODUODENOSCOPY (EGD) WITH PROPOFOL;  Surgeon: Mansouraty, Gabriel Jr., MD;  [facility];  Service: Gastroenterology;  Laterality: N/A;   ESOPHAGOGASTRODUODENOSCOPY (EGD) WITH PROPOFOL N/A 12/28/2021   Procedure: ESOPHAGOGASTRODUODENOSCOPY (EGD) WITH  PROPOFOL;  Surgeon: Mansouraty, Gabriel Jr., MD;  [facility];  Service: Gastroenterology;  Laterality: N/A;   EUS N/A 12/28/2021   Procedure: UPPER ENDOSCOPIC ULTRASOUND (EUS) LINEAR;  Surgeon: Mansouraty, Gabriel Jr., MD;  [facility];  Service: Gastroenterology;  Laterality: N/A;   INCISION AND DRAINAGE ABSCESS N/A 01/25/2022   Procedure: INCISION AND DRAINAGE BACK ABSCESS;  Surgeon: Kinsinger, Luke Aaron, MD;  [facility];  Service: General;  Laterality: N/A;  90 L DOW   IR PARACENTESIS  08/08/2020   IR PARACENTESIS  03/13/2021   IR PARACENTESIS  02/23/2022   IR US GUIDE BX ASP/DRAIN  01/23/2022   LEEP  1990's   ORIF FOOT FRACTURE  09/2008   L 5th metatarsal   PANCREATIC STENT PLACEMENT  01/27/2022   Procedure: PANCREATIC STENT PLACEMENT;  Surgeon: Mansouraty, Gabriel Jr., MD;  [facility];  Service: Gastroenterology;;   REFRACTIVE SURGERY  2000   REMOVAL OF STONES  01/27/2022   Procedure: REMOVAL OF STONES;  Surgeon: Mansouraty, Gabriel Jr., MD;  [facility];  Service: Gastroenterology;;   SPHINCTEROTOMY  01/27/2022   Procedure: SPHINCTEROTOMY;  Surgeon: Mansouraty, Gabriel Jr., MD;  [facility];  Service: Gastroenterology;;   TIBIA IM NAIL INSERTION Left 03/12/2021   Procedure: INTRAMEDULLARY (IM) NAIL TIBIAL;  Surgeon: Nitka, James E, MD;  [facility];  Service: Orthopedics;  Laterality: Left;   TONSILLECTOMY     UPPER ESOPHAGEAL ENDOSCOPIC ULTRASOUND (EUS) N/A 08/13/2018   Procedure: UPPER ESOPHAGEAL ENDOSCOPIC ULTRASOUND (EUS);  Surgeon: Mansouraty, Gabriel Jr., MD;  [facility];  Service: Gastroenterology;  Laterality: N/A;   UPPER ESOPHAGEAL ENDOSCOPIC ULTRASOUND (EUS) N/A 01/19/2019   Procedure: UPPER ESOPHAGEAL ENDOSCOPIC ULTRASOUND (EUS);  Surgeon: Mansouraty, Gabriel Jr., MD;  [facility];  Service: Gastroenterology;  Laterality: N/A;    Family History  Problem Relation Age of Onset   Other Mother         tachycardia...?afib   Stroke Father        after hernia suegery   Prostate cancer Father    Other Father        global transient amnesia, unclear source   Atrial fibrillation Sister    Healthy Brother    Healthy Brother    Coronary artery disease Paternal Grandmother    Heart attack Paternal Grandmother 80   Brain cancer Maternal Grandfather        ?   Cancer Paternal Grandfather        ?   Breast cancer Maternal Grandmother       Social History   Socioeconomic History   Marital status: Married    Spouse name: Not on file   Number of children: 1   Years of education: 16   Highest education level: Not on file  Occupational History   Occupation: Director of business development  Tobacco Use   Smoking status: Never   Smokeless tobacco: Never  Vaping Use   Vaping Use: Never used  Substance and Sexual Activity   Alcohol use: Not Currently    Alcohol/week: 7.0 standard drinks of alcohol    Types: 7   Glasses of wine per week    Comment: wine   Drug use: No   Sexual activity: Not on file  Other Topics Concern   Not on file  Social History Narrative   Patient is married one stepson    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 not traveling   regular exercise , walking 4-5 times a week   Diet fruits and veggies, water   Alcohol at least several glasses of wine a week Stopped 08/2018   Never smoker, no drug use   Right handed   One story home   [place] in the am   Social Determinants of Health   Financial Resource Strain: Not on file  Food Insecurity: No Food Insecurity (01/22/2022)   Hunger Vital Sign    Worried About Running Out of Food in the Last Year: Never true    Ran Out of Food in the Last Year: Never true  Transportation Needs: No Transportation Needs (01/22/2022)   PRAPARE - Hydrologist (Medical): No    Lack of Transportation (Non-Medical): No  Physical Activity:  Not on file  Stress: Not on file  Social Connections: Not on file    Allergies  Allergen Reactions   Ceftriaxone Hives    Immediate bright red itchy rash on bilateral legs after starting infusion.     Gluten Meal Other (See Comments)    Stomach distress   Zocor [Simvastatin] Other (See Comments)    Elevated lfts?     Current Outpatient Medications:    ALPRAZolam (XANAX) 0.25 MG tablet, Take 1 tablet (0.25 mg total) by mouth daily., Disp: 30 tablet, Rfl: 0   amoxicillin-clavulanate (AUGMENTIN) 875-125 MG tablet, Take 1 tablet by mouth 2 (two) times daily., Disp: 60 tablet, Rfl: 1   Blood Glucose Monitoring Suppl (ONE TOUCH ULTRA 2) w/Device KIT, Use to check blood sugars two times day, Disp: 1 each, Rfl: 0   Calcium Carb-Cholecalciferol (CALCIUM+D3 PO), Take 1 tablet by mouth daily., Disp: , Rfl:    cetirizine (ZYRTEC) 10 MG tablet, Take 10 mg by mouth at bedtime., Disp: , Rfl:    cholecalciferol (VITAMIN D3) 25 MCG (1000 UNIT) tablet, Take 1,000 Units by mouth daily., Disp: , Rfl:    furosemide (LASIX) 20 MG tablet, Take 1 tablet (20 mg total) by mouth daily., Disp: 90 tablet, Rfl: 1   glucose blood (ONETOUCH ULTRA) test strip, CHECK BLOOD SUGAR TWICE DAILY AS DIRECTED, Disp: 100 strip, Rfl: 3   Insulin Glargine (BASAGLAR KWIKPEN) 100 UNIT/ML, Inject 30 Units into the skin daily., Disp: , Rfl:    insulin lispro (HUMALOG KWIKPEN) 100 UNIT/ML KwikPen, Inject 2-10 units into the skin before meals as needed for elevated BGL, PER SLIDING SCALE, Disp: , Rfl:    Insulin Pen Needle 32G X 4 MM MISC, 1 Device by Does not apply route in the morning, at noon, in the evening, and at bedtime., Disp: 400 each, Rfl: 3   Lancets (ONETOUCH ULTRASOFT) lancets, Use to check blood sugar two times a day., Disp: 200 each, Rfl: 3   meclizine (ANTIVERT) 25 MG tablet, Take 25 mg by mouth daily as needed for dizziness., Disp: , Rfl:    Melatonin 10 MG TABS, Take 20 mg by mouth at bedtime., Disp: , Rfl:     spironolactone (ALDACTONE) 50 MG tablet, Take 1 tablet (50 mg total) by mouth daily., Disp: 90 tablet, Rfl: 1  vancomycin (VANCOCIN) 125 MG capsule, Take 1 capsule (125 mg total) by mouth daily., Disp: 30 capsule, Rfl: 1   cyanocobalamin 2000 MCG tablet, Take 2,000 mcg by mouth daily. (Patient not taking: Reported on 03/15/2022), Disp: , Rfl:    Review of Systems  Constitutional:  Negative for activity change, appetite change, chills, diaphoresis, fatigue, fever and unexpected weight change.  HENT:  Negative for congestion, rhinorrhea, sinus pressure, sneezing, sore throat and trouble swallowing.   Eyes:  Negative for photophobia and visual disturbance.  Respiratory:  Negative for cough, chest tightness, shortness of breath, wheezing and stridor.   Cardiovascular:  Negative for chest pain, palpitations and leg swelling.  Gastrointestinal:  Negative for abdominal distention, abdominal pain, anal bleeding, blood in stool, constipation, diarrhea, nausea and vomiting.  Genitourinary:  Negative for difficulty urinating, dysuria, flank pain and hematuria.  Musculoskeletal:  Negative for arthralgias, back pain, gait problem, joint swelling and myalgias.  Skin:  Positive for color change. Negative for pallor, rash and wound.  Neurological:  Negative for dizziness, tremors, weakness and light-headedness.  Hematological:  Negative for adenopathy. Does not bruise/bleed easily.  Psychiatric/Behavioral:  Negative for agitation, behavioral problems, confusion, decreased concentration, dysphoric mood and sleep disturbance.        Objective:   Physical Exam Constitutional:      General: She is not in acute distress.    Appearance: Normal appearance. She is well-developed. She is not ill-appearing or diaphoretic.  HENT:     Head: Normocephalic and atraumatic.     Right Ear: Hearing and external ear normal.     Left Ear: Hearing and external ear normal.     Nose: No nasal deformity or rhinorrhea.  Eyes:      General: No scleral icterus.    Conjunctiva/sclera: Conjunctivae normal.     Right eye: Right conjunctiva is not injected.     Left eye: Left conjunctiva is not injected.     Pupils: Pupils are equal, round, and reactive to light.  Neck:     Vascular: No JVD.  Cardiovascular:     Rate and Rhythm: Normal rate and regular rhythm.     Heart sounds: Normal heart sounds, S1 normal and S2 normal. No murmur heard.    No friction rub.  Abdominal:     General: Bowel sounds are normal. There is no distension.     Palpations: Abdomen is soft.     Tenderness: There is no abdominal tenderness.  Musculoskeletal:        General: Normal range of motion.     Right shoulder: Normal.     Left shoulder: Normal.     Cervical back: Normal range of motion and neck supple.     Right hip: Normal.     Left hip: Normal.     Right knee: Normal.     Left knee: Normal.  Lymphadenopathy:     Head:     Right side of head: No submandibular, preauricular or posterior auricular adenopathy.     Left side of head: No submandibular, preauricular or posterior auricular adenopathy.     Cervical: No cervical adenopathy.     Right cervical: No superficial or deep cervical adenopathy.    Left cervical: No superficial or deep cervical adenopathy.  Skin:    General: Skin is warm and dry.     Coloration: Skin is not pale.     Findings: No abrasion, bruising, ecchymosis, erythema, lesion or rash.     Nails: There is no clubbing.    Neurological:     Mental Status: She is alert and oriented to person, place, and time.     Sensory: No sensory deficit.     Coordination: Coordination normal.     Gait: Gait normal.  Psychiatric:        Attention and Perception: She is attentive.        Speech: Speech normal.        Behavior: Behavior normal. Behavior is cooperative.        Thought Content: Thought content normal.        Judgment: Judgment normal.    Wounds area of induration superior to this palpable  02/22/2022:     Site 03/19/2022:       Assessment & Plan:  Infected pseudocyst with abscess that communicated with skin sp I and D  Possible have a CT scan this Monday on the 20th if not as mentioned attempts are being made to have the scan done as an inpatient.  We will continue Augmentin in the interim.  History of C. difficile colitis: Continue vancomycin prophylactically      Hepatic cirrhosis with ascites: Recent ascites not consistent with infection.  Vaccine counseling recommended flu and COVID-19 vaccines but she did not want either today. 

## 2022-03-20 NOTE — Telephone Encounter (Signed)
  Pt has been approved for the CT at [facility] along with the Facility Fee as well:  [facility] CT department was notified of pt CT time and date and to please add to pt  notes for the CT scan that the pt is an extremely hard IV stick and the IV team may need to be consulted prior to CT scan for pt:   There is a recently new protocol for the  CT scans where some of the pts are drinking water before the CT scan and others drink the oral contrast: I know that we have went through a lot to get this ordered and want to ensure its the correct prep.  Please advise if you would like the standard oral prep or just the water under the new protocol. Please advise

## 2022-03-20 NOTE — Telephone Encounter (Signed)
She needs oral contrast

## 2022-03-20 NOTE — Telephone Encounter (Signed)
Let's be sure [facility] CT will have someone that could start the IV for her - am concerned about the time of day and availability of "better" IV personnel

## 2022-03-21 ENCOUNTER — Encounter: Payer: Self-pay | Admitting: Family Medicine

## 2022-03-21 ENCOUNTER — Ambulatory Visit (INDEPENDENT_AMBULATORY_CARE_PROVIDER_SITE_OTHER): Payer: 59 | Admitting: Family Medicine

## 2022-03-21 ENCOUNTER — Ambulatory Visit: Payer: 59 | Admitting: Family Medicine

## 2022-03-21 VITALS — BP 130/84 | HR 107 | Temp 98.0°F | Ht 68.25 in | Wt 146.1 lb

## 2022-03-21 DIAGNOSIS — Z87820 Personal history of traumatic brain injury: Secondary | ICD-10-CM | POA: Diagnosis not present

## 2022-03-21 DIAGNOSIS — S060X0A Concussion without loss of consciousness, initial encounter: Secondary | ICD-10-CM | POA: Diagnosis not present

## 2022-03-21 MED ORDER — AMITRIPTYLINE HCL 25 MG PO TABS: 25.0000 mg | ORAL_TABLET | Freq: Every day | ORAL | 3 refills | Status: DC

## 2022-03-21 NOTE — Telephone Encounter (Signed)
Inbound call from patient returning your call. I made patient aware that she needs to be at [facility] at 3:30 PM to drink oral contrast and she needed to be NPO 4 hrs prior. Patient had no questions at the end of the call. Stated if you needed to call her back in regards to something else she would be available the rest of the day.

## 2022-03-21 NOTE — Patient Instructions (Signed)
Mild Traumatic Brain Injury (CONCUSSSION):  Mallory Mckimmy, MD, [facility] Adapted from [facility], [facility] [facility], and [employer]  Think of the human brain almost as an egg yolk and your skull as an egg shell. When your head or body takes a hit, it can cause your brain to shake around inside your skull, injuring the brain. A concussion is not only caused by a hit to the head, but can also be caused by an impact to the body that ends up shaking your brain in your skull, such as whiplash.  A common misconception about concussion is that one loses consciousness. However, loss of consciousness occurs in only less than 10% of concussion cases.  Concussive symptoms typically resolve in 7 to 10 days (sports-related concussions) or within 3 months (non-athletes).  Approximately 20% of people have symptoms after 6 weeks.  With concussions, we have learned that it generally takes youth longer to recover from concussions. Another thing that we now know about concussions is that it usually takes female athletes longer to recover than female athletes.  No two concussions are identical. In fact, there are at least six different clinical trajectories that concussions may take.  Signs and Symptoms of Concussion:  The signs and symptoms of a concussion are incredibly important because a concussion doesn't show up on imaging like an x-ray, CT, or MRI scan and there is no objective test, like a blood or saliva test, that can determine if a patient has a concussion. A doctor makes a concussion diagnosis based on the results of a comprehensive examination, which includes observing signs of concussion and patients reporting symptoms of concussion appearing after an impact to the head or body. Concussion signs and symptoms are the brain's way of showing it is injured and not functioning normally.  CONCUSSION SIGNS  Concussion signs are what someone could observe about you to  determine if you have a concussion.   Common concussion signs include:  Loss of consciousness Problems with balance Glazed look in the eyes Amnesia Delayed response to questions Forgetting an instruction, confusion about an assignment or position, or  confusion of the game, score, or opponent Inappropriate crying Inappropriate laughter Vomiting  CONCUSSION SYMPTOMS  Concussion symptoms are what someone who is concussed will tell you that they are experiencing. Concussion symptoms typically fall into six major categories:  1- Somatic (Physical) Symptoms  Headache Light-headedness Dizziness Nausea Sensitivity to light Sensitivity to noise  2- Cognitive Symptoms  Difficulties with attention Memory problems Loss of focus Difficulty multitasking Difficulty completing mental tasks  3- Sleep Symptoms  Sleeping more than usual Sleeping less than usual Having trouble falling asleep  4- Emotional Symptoms  Anxiety Depression Panic attacks  5 - Vestibular Symptoms  The vestibular system is affected in nearly 60% of youth and adolescent athletes following a concussion.  But what is the vestibular system?  It's the sensory system that helps with your sense of balance and spatial orientation. Think of a gyroscope! With help from your inner ears, your vestibular system detects the motion or position of your head in space. It sends information to your brain that's needed for balance and stable vision.  Need an example? If you're moving and looking at moving objects at the same time (think riding in a car), you're able to stay focused and not lose visual clarity. During a vestibular concussion, your gyroscope isn't working at full potential.  Difficulty with balance Dizziness. It may feel like the room is spinning   or a slow, wavy sensation. (Like you're on a boat!) Trouble stabilizing vision when moving your head. (We call this Vestibular-Ocular Reflux, or VOR.) Think  back to riding in a car. With a vestibular concussion, you can't stay focused. (The technical name for this is Visual Motion Sensitivity, or VMS.) Triggers  These 3 things could bring on vestibular symptoms:  Dynamic movements Busy environments, like the grocery store Crowds  6 - Oculo-motor  A concussion that affects the ocular, or visual, system of the brain. Typically, patients with ocular-motor concussions report pressure headaches in the front of their head, feeling more tired than normal, and becoming more symptomatic doing math or science exercises at school.  Patients also experience difficulties with their eyes working together. Follow along to explore some of the most common symptoms.  Convergence  The eyes converge when viewing objects up close, such as with reading. With convergence problems, patients may see a double image as a target moves closer to them. Typically, without a concussion, objects can be brought very close without doubling.  Accommodation  Accommodation problems cause an object to become blurry as it is viewed up close. Accommodative and convergence problems are often experienced together and can impact reading and other near-vision activities.  Pursuits and Saccades  The eyes use pursuit eye movements to follow objects; while saccade eyes movements allow the eyes to shift rapidly from one object to another. Tracking objects, reading a book, scrolling on a computer or even watching for a moving car while crossing the street can be difficult when patients have problems with pursuit and saccade eye movements.  Misalignment  When people with eye misalignment (one eye drifts, eyes aren't perfectly aligned) sustain a concussion, the brain may have difficulty compensating for the misalignment like it once did. This can result in blurry vision, difficulty taking notes in class and focusing on the chalkboard.  Note: This is not an exhaustive list of concussion  signs and symptoms, and it may take a few days for concussion symptoms to appear after the initial injury.  TREATMENT:  THE MOST IMPORTANT THING IS REST AS SOON AS POSSIBLE AFTER INJURY SO THAT THE BRAIN CAN RECOVER. COMPLETE PHYSICAL AND MENTAL REST ARE NEEDED INITIALLY.   THAT MEANS: NO SCHOOL OR WORK FOR AT LEAST 3 DAYS AND CLEARED BY YOUR DOCTOR NO MENTAL EXERTION, MEANING NO WORK, NO HOMEWORK, NO TEST TAKING.  Avoid situations with loud noise, bright lights, or crowds. However, this doesn't mean isolate yourself in a dark room for a week. Too much isolation and boredom can be harmful, contributing to feelings of anxiety, depression, and resulting in increased recovery time. Spend time with friends and family, but monitor your symptoms and avoid situations that make you feel worse.   NO VIDEO GAMES, NO USING THE COMPUTER, NO TEXTING, NO USING SMARTPHONES, NO USE OF AN IPAD OR TABLET. DO NOT GO TO A MOVIE THEATRE OR WATCH SPORTS ON TV. HDTV TENDS TO MAKE PEOPLE FEEL WORSE.   ON APPROXIMATELY DAY 4, BEGIN VERY LIGHT EXERCISE SUCH AS WALKING. DO NOT START ANY STRENUOUS EXERCISE.   You will be given return to school or return to work recommendations.  

## 2022-03-21 NOTE — Telephone Encounter (Signed)
Pt was made aware that Dr. Leone Payor wanted her to drink the oral contrast: Pt to arrive at [facility] at 3:30 PM  to start drinking 1st bottle of contrast:  Nothing to eat or drink 4 hours prior starting at 1:30: Pt made aware Pt made aware that insurance  has authorized the CT scan  along with the Facility Fee Pt verbalized understanding with all questions answered.
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
## 2022-03-21 NOTE — Telephone Encounter (Signed)
Radiology scheduling notified that Dr. Leone Payor wants the  pt  to have oral contrast:  Pt now to arrive at 3:30 PM at [facility] to drink oral contrast:  Left message for pt to call back

## 2022-03-22 ENCOUNTER — Other Ambulatory Visit: Payer: 59

## 2022-03-22 ENCOUNTER — Other Ambulatory Visit: Payer: Self-pay | Admitting: Family Medicine

## 2022-03-22 ENCOUNTER — Telehealth: Payer: Self-pay

## 2022-03-22 DIAGNOSIS — E871 Hypo-osmolality and hyponatremia: Secondary | ICD-10-CM

## 2022-03-22 NOTE — Telephone Encounter (Signed)
Patient came in office today for lab only appointment. Mallory Stewart states that patient was insistent that she was having more labs done than just the Lipid that was on order. Not sure if you wanted other things done. Patient has rescheduled lab for tomorrow.Marland Kitchen

## 2022-03-22 NOTE — Telephone Encounter (Signed)
She has a recurrent order for weekly BMET that should be in there longterm.

## 2022-03-23 ENCOUNTER — Other Ambulatory Visit (INDEPENDENT_AMBULATORY_CARE_PROVIDER_SITE_OTHER): Payer: 59

## 2022-03-23 DIAGNOSIS — E871 Hypo-osmolality and hyponatremia: Secondary | ICD-10-CM

## 2022-03-23 DIAGNOSIS — E7800 Pure hypercholesterolemia, unspecified: Secondary | ICD-10-CM | POA: Diagnosis not present

## 2022-03-23 DIAGNOSIS — Z1211 Encounter for screening for malignant neoplasm of colon: Secondary | ICD-10-CM

## 2022-03-23 LAB — BASIC METABOLIC PANEL
BUN: 30 mg/dL — ABNORMAL HIGH (ref 6–23)
CO2: 28 mEq/L (ref 19–32)
Calcium: 9.7 mg/dL (ref 8.4–10.5)
Chloride: 100 mEq/L (ref 96–112)
Creatinine, Ser: 0.85 mg/dL (ref 0.40–1.20)
GFR: 75.12 mL/min (ref >60.00)
Glucose, Bld: 176 mg/dL — ABNORMAL HIGH (ref 70–99)
Potassium: 4.6 mEq/L (ref 3.5–5.1)
Sodium: 135 mEq/L (ref 135–145)

## 2022-03-23 LAB — LIPID PANEL
Cholesterol: 187 mg/dL (ref 0–200)
HDL: 69.8 mg/dL (ref >39.00)
LDL Cholesterol: 92 mg/dL (ref 0–99)
NonHDL: 117.33
Total CHOL/HDL Ratio: 3
Triglycerides: 126 mg/dL (ref 0.0–149.0)
VLDL: 25.2 mg/dL (ref 0.0–40.0)

## 2022-03-23 NOTE — Addendum Note (Signed)
Addended by: Shon Millet on: 03/23/2022 09:34 AM   Modules accepted: Orders

## 2022-03-26 ENCOUNTER — Ambulatory Visit (HOSPITAL_COMMUNITY): Payer: 59

## 2022-03-26 ENCOUNTER — Ambulatory Visit (HOSPITAL_COMMUNITY)
Admission: RE | Admit: 2022-03-26 | Discharge: 2022-03-26 | Disposition: A | Discharge status: Home / Self Care | Payer: 59 | Source: Ambulatory Visit | Attending: Internal Medicine | Admitting: Internal Medicine

## 2022-03-26 ENCOUNTER — Telehealth: Payer: Self-pay | Admitting: Family Medicine

## 2022-03-26 DIAGNOSIS — K863 Pseudocyst of pancreas: Secondary | ICD-10-CM | POA: Diagnosis present

## 2022-03-26 DIAGNOSIS — Z789 Other specified health status: Secondary | ICD-10-CM | POA: Diagnosis present

## 2022-03-26 DIAGNOSIS — K7031 Alcoholic cirrhosis of liver with ascites: Secondary | ICD-10-CM | POA: Diagnosis present

## 2022-03-26 MED ORDER — IOHEXOL 9 MG/ML PO SOLN: 1000.0000 mL | ORAL | Status: AC

## 2022-03-26 MED ORDER — IOHEXOL 300 MG/ML  SOLN
100.0000 mL | Freq: Once | INTRAMUSCULAR | Status: AC | PRN
  Administered 2022-03-26: 100 mL via INTRAVENOUS

## 2022-03-26 MED ORDER — TRAZODONE HCL 100 MG PO TABS: 50.0000 mg | ORAL_TABLET | Freq: Every day | ORAL | 2 refills | Status: DC

## 2022-03-26 MED ORDER — IOHEXOL 9 MG/ML PO SOLN
ORAL | Status: AC
  Administered 2022-03-26: 1000 mL
  Filled 2022-03-26: qty 1000

## 2022-03-26 MED ORDER — IOHEXOL 9 MG/ML PO SOLN: 1000.0000 mL | Freq: Once | ORAL | Status: DC

## 2022-03-26 NOTE — Telephone Encounter (Signed)
Patient was prescribed medication amitriptyline (ELAVIL) 25 MG tablet  for her concussion by Dr Patsy Lager on 11/15, she called in stating that medication is suppose to help her sleep but its doing the opposite and making her headache worst. She would like to know if theres an alternative that she can take to help her?  [id number] [place], [place] - [address] AT [facility] OF [address] Phone: [phone]  Fax: [phone]

## 2022-03-26 NOTE — Telephone Encounter (Signed)
Mallory Stewart notified as instructed by telephone.  She states she doesn't think she can take Tylenol with her liver issue so she has just been taking Ibuprofen.   Please advise.

## 2022-03-26 NOTE — Telephone Encounter (Signed)
Please call  That would be unusual, but stop the amitryptiline if she thinks it is making her worse.  I sent her in some Trazodone for sleep.   There is basically nothing outside of that class used for post-concussive headaches, but she can take tylenol or ibuprofen for headache.

## 2022-03-26 NOTE — Telephone Encounter (Signed)
She is correct.  She should avoid Tylenol, but she fine to take ibuprofen.

## 2022-03-26 NOTE — Telephone Encounter (Signed)
Left message for [name] to return my call.

## 2022-03-28 ENCOUNTER — Telehealth: Payer: Self-pay

## 2022-03-28 NOTE — Telephone Encounter (Signed)
Spoke with Mallory Stewart, advised her that Dr. Daiva Eves would like her to stop the Augmentin as her CT scan looks great. She is happy to hear this news, but would like to know if she should stop the vancomycin as well. Will route to provider.    Sandie Ano, RN

## 2022-03-28 NOTE — Telephone Encounter (Signed)
-----   Message from Randall Hiss, MD sent at 03/27/2022  8:18 PM EST ----- Can we have her stop her augmenting. CT looks great ----- Message ----- From: Iva Boop, MD Sent: 03/27/2022   5:02 PM EST To: Randall Hiss, MD; #  Called patient with results.  The abscess/fluid collection related to the pancreas is resolved.  She still has ascites and a pancreatic cyst.  She is eating okay and does not seem to have too many symptoms from the ascites at this time.  She is now on furosemide 20 and spironolactone 50 mg daily and her sodium is normal so I would not change that as she has had hyponatremia on higher doses.  She has a liver clinic follow-up in January.  She will call back here as needed she is aware to watch her weight and to notify us if her ascites is worsening.

## 2022-04-03 ENCOUNTER — Other Ambulatory Visit: Payer: Self-pay | Admitting: Family Medicine
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
## 2022-04-03 NOTE — Telephone Encounter (Signed)
Last office visit 03/21/22 with Dr. Patsy Lager for concussion.  Last refilled 03/16/22 for #30 with no refills.  Next Appt: 04/11/22 with Dr. Patsy Lager for follow up.

## 2022-04-04 NOTE — Telephone Encounter (Signed)
Please see note Below. Just FYI

## 2022-04-04 NOTE — Telephone Encounter (Signed)
Inbound call from patient stating she has been experiencing soreness. And also has a large lump on her back that has spread and gives her discomfort. Please advise.

## 2022-04-04 NOTE — Telephone Encounter (Signed)
Pt stated that she has a fluid sac on her back the same place as the cyst was that its very uncomfortable and painful, hard to yawn and sneeze due to the pain, described as (zip lock bag filled with water, placed under skin, hard), Mid back lower where waist is,  Pt was given office visit with Dr. Leone Payor on 04/11/2022 at 1:50 PM: Pt made aware:  Pt is requesting sooner appointment: Pt was notified that Dr. Leone Payor is  completely booked prior:  Please advise:

## 2022-04-04 NOTE — Telephone Encounter (Signed)
Pt was made aware of Dr. Leone Payor recommendations  [facility] contacted and spoke with Adelina Mings Triage Nurse: Pt was scheduled for an office visit with Dr. Sheliah Hatch on 04/11/2022 at 11:00 AM: Pt made aware Pt was scheduled for an office visit with Dr. Leone Payor on 04/06/2022 at 11:30 Pt made aware Pt verbalized understanding with all questions answered.   :

## 2022-04-04 NOTE — Telephone Encounter (Signed)
Based upon what she is describing I think she needs to see the surgeons - can we see if they are able to see her  I can examine her in person Friday at 1130 I

## 2022-04-05 MED ORDER — ALPRAZOLAM 0.25 MG PO TABS: 0.2500 mg | ORAL_TABLET | Freq: Every day | ORAL | 0 refills | Status: DC

## 2022-04-05 MED ORDER — ALPRAZOLAM 0.25 MG PO TABS: 0.2500 mg | ORAL_TABLET | Freq: Two times a day (BID) | ORAL | 0 refills | Status: DC | PRN

## 2022-04-05 NOTE — Addendum Note (Signed)
Addended by: Damita Lack on: 04/05/2022 12:51 PM   Modules accepted: Orders

## 2022-04-05 NOTE — Telephone Encounter (Signed)
Pt called asking for status for her med refill & the lab orders? Pt mentioned that the fluid sack on her back has returned & she is scheduled to see a surgeon on 04/11/22. Pt stated her anxiety has increased due to the sack returning. Call back # [phone]

## 2022-04-05 NOTE — Telephone Encounter (Signed)
Spoke with Mallory Stewart to find out how she is taking the alprazolam.  Per her medication list in states one tablet daily.  She states with her concussion and Thanksgiving her anxiety has been worse.  Her fluid sack is back which she is seeing the surgeon on 04/11/22 but that has also increased her anxiety.  She currently has 2 tablets worse.  She said in a 24 hour period the most she has taken is 3 tablets daily.  There are some days she takes none.   I also advised her lab orders are now in.  She is scheduled to see GI at [facility] tomorrow so she will go to the [facility] lab and get her BMET drawn tomorrow.    Please advise about refill.

## 2022-04-05 NOTE — Telephone Encounter (Signed)
Mallory Stewart notified as instructed by telephone.  Patient states understanding.

## 2022-04-05 NOTE — Addendum Note (Signed)
Addended by: Kerby Nora E on: 04/05/2022 01:15 PM   Modules accepted: Orders

## 2022-04-05 NOTE — Telephone Encounter (Signed)
Let patient know I have sent in a prescription for alprazolam 0.25 mg p.o. every 12 hours as needed #60 0 refills.  She should continue to try to limit medication. If symptoms are persistent she should make a follow-up appointment to determine if anxiety is controlled in the best way.

## 2022-04-06 ENCOUNTER — Other Ambulatory Visit: Payer: Self-pay

## 2022-04-06 ENCOUNTER — Encounter: Payer: Self-pay | Admitting: Internal Medicine

## 2022-04-06 ENCOUNTER — Emergency Department (HOSPITAL_COMMUNITY): Payer: 59

## 2022-04-06 ENCOUNTER — Ambulatory Visit (INDEPENDENT_AMBULATORY_CARE_PROVIDER_SITE_OTHER): Payer: 59 | Admitting: Internal Medicine

## 2022-04-06 ENCOUNTER — Encounter (HOSPITAL_COMMUNITY): Payer: Self-pay

## 2022-04-06 ENCOUNTER — Other Ambulatory Visit (INDEPENDENT_AMBULATORY_CARE_PROVIDER_SITE_OTHER): Payer: 59

## 2022-04-06 ENCOUNTER — Inpatient Hospital Stay (HOSPITAL_COMMUNITY)
Admission: EM | Admit: 2022-04-06 | Discharge: 2022-04-10 | DRG: 372 | Disposition: A | Discharge status: Home / Self Care | Payer: 59 | Attending: Internal Medicine | Admitting: Internal Medicine

## 2022-04-06 VITALS — BP 122/82 | HR 100 | Ht 68.0 in | Wt 142.0 lb

## 2022-04-06 VITALS — BP 122/78 | HR 107 | Ht 68.25 in | Wt 142.2 lb

## 2022-04-06 DIAGNOSIS — Z888 Allergy status to other drugs, medicaments and biological substances status: Secondary | ICD-10-CM

## 2022-04-06 DIAGNOSIS — Z794 Long term (current) use of insulin: Secondary | ICD-10-CM | POA: Diagnosis not present

## 2022-04-06 DIAGNOSIS — Z1211 Encounter for screening for malignant neoplasm of colon: Secondary | ICD-10-CM

## 2022-04-06 DIAGNOSIS — E8809 Other disorders of plasma-protein metabolism, not elsewhere classified: Secondary | ICD-10-CM | POA: Diagnosis present

## 2022-04-06 DIAGNOSIS — K860 Alcohol-induced chronic pancreatitis: Secondary | ICD-10-CM | POA: Diagnosis present

## 2022-04-06 DIAGNOSIS — K7031 Alcoholic cirrhosis of liver with ascites: Secondary | ICD-10-CM

## 2022-04-06 DIAGNOSIS — J302 Other seasonal allergic rhinitis: Secondary | ICD-10-CM | POA: Diagnosis present

## 2022-04-06 DIAGNOSIS — I8510 Secondary esophageal varices without bleeding: Secondary | ICD-10-CM | POA: Diagnosis present

## 2022-04-06 DIAGNOSIS — I700 Atherosclerosis of aorta: Secondary | ICD-10-CM | POA: Diagnosis present

## 2022-04-06 DIAGNOSIS — Z79891 Long term (current) use of opiate analgesic: Secondary | ICD-10-CM

## 2022-04-06 DIAGNOSIS — E1143 Type 2 diabetes mellitus with diabetic autonomic (poly)neuropathy: Secondary | ICD-10-CM | POA: Diagnosis present

## 2022-04-06 DIAGNOSIS — E1165 Type 2 diabetes mellitus with hyperglycemia: Secondary | ICD-10-CM

## 2022-04-06 DIAGNOSIS — K3184 Gastroparesis: Secondary | ICD-10-CM | POA: Diagnosis present

## 2022-04-06 DIAGNOSIS — K651 Peritoneal abscess: Principal | ICD-10-CM

## 2022-04-06 DIAGNOSIS — Z1611 Resistance to penicillins: Secondary | ICD-10-CM | POA: Diagnosis present

## 2022-04-06 DIAGNOSIS — E1140 Type 2 diabetes mellitus with diabetic neuropathy, unspecified: Secondary | ICD-10-CM | POA: Diagnosis present

## 2022-04-06 DIAGNOSIS — I10 Essential (primary) hypertension: Secondary | ICD-10-CM | POA: Diagnosis present

## 2022-04-06 DIAGNOSIS — L03116 Cellulitis of left lower limb: Secondary | ICD-10-CM | POA: Diagnosis present

## 2022-04-06 DIAGNOSIS — Z789 Other specified health status: Secondary | ICD-10-CM

## 2022-04-06 DIAGNOSIS — K6819 Other retroperitoneal abscess: Principal | ICD-10-CM | POA: Diagnosis present

## 2022-04-06 DIAGNOSIS — I864 Gastric varices: Secondary | ICD-10-CM | POA: Diagnosis present

## 2022-04-06 DIAGNOSIS — Z91018 Allergy to other foods: Secondary | ICD-10-CM

## 2022-04-06 DIAGNOSIS — K863 Pseudocyst of pancreas: Secondary | ICD-10-CM | POA: Diagnosis present

## 2022-04-06 DIAGNOSIS — E871 Hypo-osmolality and hyponatremia: Secondary | ICD-10-CM

## 2022-04-06 DIAGNOSIS — D649 Anemia, unspecified: Secondary | ICD-10-CM | POA: Insufficient documentation

## 2022-04-06 DIAGNOSIS — E785 Hyperlipidemia, unspecified: Secondary | ICD-10-CM | POA: Diagnosis present

## 2022-04-06 DIAGNOSIS — L02211 Cutaneous abscess of abdominal wall: Secondary | ICD-10-CM | POA: Diagnosis present

## 2022-04-06 DIAGNOSIS — K8681 Exocrine pancreatic insufficiency: Secondary | ICD-10-CM | POA: Diagnosis present

## 2022-04-06 DIAGNOSIS — B961 Klebsiella pneumoniae [K. pneumoniae] as the cause of diseases classified elsewhere: Secondary | ICD-10-CM | POA: Diagnosis present

## 2022-04-06 DIAGNOSIS — Z87820 Personal history of traumatic brain injury: Secondary | ICD-10-CM

## 2022-04-06 DIAGNOSIS — Z8249 Family history of ischemic heart disease and other diseases of the circulatory system: Secondary | ICD-10-CM

## 2022-04-06 DIAGNOSIS — E872 Acidosis, unspecified: Secondary | ICD-10-CM | POA: Diagnosis present

## 2022-04-06 DIAGNOSIS — I868 Varicose veins of other specified sites: Secondary | ICD-10-CM | POA: Diagnosis present

## 2022-04-06 DIAGNOSIS — K766 Portal hypertension: Secondary | ICD-10-CM | POA: Diagnosis present

## 2022-04-06 DIAGNOSIS — Z9049 Acquired absence of other specified parts of digestive tract: Secondary | ICD-10-CM

## 2022-04-06 DIAGNOSIS — E1169 Type 2 diabetes mellitus with other specified complication: Secondary | ICD-10-CM | POA: Diagnosis present

## 2022-04-06 DIAGNOSIS — Z79899 Other long term (current) drug therapy: Secondary | ICD-10-CM

## 2022-04-06 DIAGNOSIS — Z881 Allergy status to other antibiotic agents status: Secondary | ICD-10-CM

## 2022-04-06 DIAGNOSIS — Z8619 Personal history of other infectious and parasitic diseases: Secondary | ICD-10-CM | POA: Insufficient documentation

## 2022-04-06 LAB — LIPASE, BLOOD: Lipase: 34 U/L (ref 11–51)

## 2022-04-06 LAB — CBC WITH DIFFERENTIAL/PLATELET
Abs Immature Granulocytes: 0.03 10*3/uL (ref 0.00–0.07)
Basophils Absolute: 0 10*3/uL (ref 0.0–0.1)
Basophils Relative: 0 %
Eosinophils Absolute: 0 10*3/uL (ref 0.0–0.5)
Eosinophils Relative: 1 %
HCT: 39 % (ref 36.0–46.0)
Hemoglobin: 12.7 g/dL (ref 12.0–15.0)
Immature Granulocytes: 1 %
Lymphocytes Relative: 12 %
Lymphs Abs: 0.8 10*3/uL (ref 0.7–4.0)
MCH: 28.3 pg (ref 26.0–34.0)
MCHC: 32.6 g/dL (ref 30.0–36.0)
MCV: 87.1 fL (ref 80.0–100.0)
Monocytes Absolute: 0.7 10*3/uL (ref 0.1–1.0)
Monocytes Relative: 10 %
Neutro Abs: 4.9 10*3/uL (ref 1.7–7.7)
Neutrophils Relative %: 76 %
Platelets: 167 10*3/uL (ref 150–400)
RBC: 4.48 MIL/uL (ref 3.87–5.11)
RDW: 13.5 % (ref 11.5–15.5)
WBC: 6.4 10*3/uL (ref 4.0–10.5)
nRBC: 0 % (ref 0.0–0.2)

## 2022-04-06 LAB — COMPREHENSIVE METABOLIC PANEL
ALT: 24 U/L (ref 0–44)
AST: 27 U/L (ref 15–41)
Albumin: 3.5 g/dL (ref 3.5–5.0)
Alkaline Phosphatase: 299 U/L — ABNORMAL HIGH (ref 38–126)
Anion gap: 14 (ref 5–15)
BUN: 35 mg/dL — ABNORMAL HIGH (ref 6–20)
CO2: 22 mmol/L (ref 22–32)
Calcium: 9.6 mg/dL (ref 8.9–10.3)
Chloride: 97 mmol/L — ABNORMAL LOW (ref 98–111)
Creatinine, Ser: 0.93 mg/dL (ref 0.44–1.00)
GFR, Estimated: >60 mL/min (ref >60)
Glucose, Bld: 280 mg/dL — ABNORMAL HIGH (ref 70–99)
Potassium: 3.7 mmol/L (ref 3.5–5.1)
Sodium: 133 mmol/L — ABNORMAL LOW (ref 135–145)
Total Bilirubin: 1 mg/dL (ref 0.3–1.2)
Total Protein: 8.3 g/dL — ABNORMAL HIGH (ref 6.5–8.1)

## 2022-04-06 LAB — BASIC METABOLIC PANEL
BUN: 32 mg/dL — ABNORMAL HIGH (ref 6–23)
CO2: 27 mEq/L (ref 19–32)
Calcium: 10.1 mg/dL (ref 8.4–10.5)
Chloride: 94 mEq/L — ABNORMAL LOW (ref 96–112)
Creatinine, Ser: 0.9 mg/dL (ref 0.40–1.20)
GFR: 70.13 mL/min (ref >60.00)
Glucose, Bld: 255 mg/dL — ABNORMAL HIGH (ref 70–99)
Potassium: 3.9 mEq/L (ref 3.5–5.1)
Sodium: 130 mEq/L — ABNORMAL LOW (ref 135–145)

## 2022-04-06 LAB — CBG MONITORING, ED
Glucose-Capillary: 300 mg/dL — ABNORMAL HIGH (ref 70–99)
Glucose-Capillary: 226 mg/dL — ABNORMAL HIGH (ref 70–99)

## 2022-04-06 LAB — POCT GLYCOSYLATED HEMOGLOBIN (HGB A1C): Hemoglobin A1C: 7.4 % — AB (ref 4.0–5.6)

## 2022-04-06 MED ORDER — IOHEXOL 300 MG/ML  SOLN
100.0000 mL | Freq: Once | INTRAMUSCULAR | Status: AC | PRN
  Administered 2022-04-06: 100 mL via INTRAVENOUS

## 2022-04-06 MED ORDER — METRONIDAZOLE 500 MG/100ML IV SOLN: 500.0000 mg | Freq: Two times a day (BID) | INTRAVENOUS | Status: DC

## 2022-04-06 MED ORDER — BASAGLAR KWIKPEN 100 UNIT/ML ~~LOC~~ SOPN: 30.0000 [IU] | PEN_INJECTOR | Freq: Every day | SUBCUTANEOUS | 3 refills | Status: DC

## 2022-04-06 MED ORDER — SODIUM CHLORIDE 0.9 % IV SOLN
3.0000 g | Freq: Four times a day (QID) | INTRAVENOUS | Status: DC
  Administered 2022-04-06 – 2022-04-10 (×15): 3 g via INTRAVENOUS
  Filled 2022-04-06 (×16): qty 8

## 2022-04-06 MED ORDER — INSULIN LISPRO (1 UNIT DIAL) 100 UNIT/ML (KWIKPEN): PEN_INJECTOR | SUBCUTANEOUS | 3 refills | Status: DC

## 2022-04-06 MED ORDER — MORPHINE SULFATE (PF) 2 MG/ML IV SOLN
1.0000 mg | INTRAVENOUS | Status: DC | PRN
  Administered 2022-04-06 – 2022-04-10 (×13): 1 mg via INTRAVENOUS
  Filled 2022-04-06 (×13): qty 1

## 2022-04-06 MED ORDER — ACETAMINOPHEN 325 MG PO TABS
650.0000 mg | ORAL_TABLET | Freq: Once | ORAL | Status: AC
  Administered 2022-04-06: 650 mg via ORAL
  Filled 2022-04-06: qty 2

## 2022-04-06 MED ORDER — INSULIN PEN NEEDLE 32G X 4 MM MISC: 1.0000 | Freq: Four times a day (QID) | 3 refills | Status: DC

## 2022-04-06 NOTE — ED Provider Notes (Signed)
[name]   CSN: [id number] Arrival date & time: 04/06/22  1331     History  Chief Complaint  Patient presents with   Wound Infection   Hyperglycemia    Mallory Stewart is a 61 y.o. female  with chief complaint of new redness, swelling, and possible infection of previous surgical site.  Began developing 2 days ago, and has continued to increase.  Per record review, admitted 01/22/2022 for abscess I&D with cellulitis and apparent communication with pancreatic pseudocyst.  Being followed by Dr. Kieth Brightly of general surgery, has been seen several times for evaluation. Denies abdominal pain, N/V/D.  No changes in urinary or bowel habits, though does note her blood sugar has significantly increased over the last few days.  Usually runs in the 100s, however has been in the 400s.  Per patient recent CT scan from 03/26/2022 unremarkable for evidence of infection.     Known Hx of alcoholic cirrhosis of the liver and uncontrolled DMT2.     Sent with note from PCP for labs, CT abdomen and pelvis, and likely admission..  The history is provided by the patient and medical records.  Hyperglycemia      Home Medications Prior to Admission medications   Medication Sig Start Date End Date Taking? Authorizing Provider  ALPRAZolam (XANAX) 0.25 MG tablet Take 1 tablet (0.25 mg total) by mouth 2 (two) times daily as needed for anxiety. Please ignore other prescription as change made. 04/05/22   Bedsole, Amy E, MD  amoxicillin-clavulanate (AUGMENTIN) 875-125 MG tablet Take 1 tablet by mouth 2 (two) times daily. Patient not taking: Reported on 04/06/2022 02/22/22   Tommy Medal, Lavell Islam, MD  Blood Glucose Monitoring Suppl (ONE TOUCH ULTRA 2) w/Device KIT Use to check blood sugars two times day 08/01/18   [name], MD  Calcium Carb-Cholecalciferol (CALCIUM+D3 PO) Take 1 tablet by mouth daily.    [provider]  cetirizine (ZYRTEC) 10 MG  tablet Take 10 mg by mouth at bedtime.    [provider]  cholecalciferol (VITAMIN D3) 25 MCG (1000 UNIT) tablet Take 1,000 Units by mouth daily.    [provider]  cyanocobalamin 2000 MCG tablet Take 2,000 mcg by mouth daily.    [provider]  furosemide (LASIX) 20 MG tablet Take 1 tablet (20 mg total) by mouth daily. 03/15/22   Gatha Mayer, MD  glucose blood (ONETOUCH ULTRA) test strip CHECK BLOOD SUGAR TWICE DAILY AS DIRECTED 12/13/21   Bedsole, Amy E, MD  Insulin Glargine (BASAGLAR KWIKPEN) 100 UNIT/ML Inject 30 Units into the skin daily. 04/06/22   Shamleffer, Melanie Crazier, MD  insulin lispro (HUMALOG KWIKPEN) 100 UNIT/ML KwikPen Max daily 30 units 04/06/22   Shamleffer, Melanie Crazier, MD  Insulin Pen Needle 32G X 4 MM MISC 1 Device by Does not apply route in the morning, at noon, in the evening, and at bedtime. 04/06/22   Shamleffer, Melanie Crazier, MD  Lancets [facility] ULTRASOFT) lancets Use to check blood sugar two times a day. 08/01/18   Bedsole, Amy E, MD  meclizine (ANTIVERT) 25 MG tablet Take 25 mg by mouth daily as needed for dizziness.    [provider]  Melatonin 10 MG TABS Take 20 mg by mouth at bedtime.    [provider]  spironolactone (ALDACTONE) 50 MG tablet Take 1 tablet (50 mg total) by mouth daily. 03/15/22   Gatha Mayer, MD  traZODone (DESYREL) 100 MG tablet Take 0.5-1  tablets (50-100 mg total) by mouth at bedtime. 03/26/22   Copland, Frederico Hamman, MD  vancomycin (VANCOCIN) 125 MG capsule Take 1 capsule (125 mg total) by mouth daily. Patient not taking: Reported on 04/06/2022 02/22/22   Tommy Medal, Lavell Islam, MD      Allergies    Ceftriaxone, Gluten meal, and Zocor [simvastatin]    Review of Systems   Review of Systems  Skin:  Positive for wound.    Physical Exam Updated Vital Signs BP 117/82 (BP Location: Right Arm)   Pulse 99   Temp 98.2 F (36.8 C) (Oral)   Resp 16   Ht _0  (1.727 m)   Wt 64.4 kg   SpO2  100%   BMI 21.59 kg/m  Physical Exam Vitals and nursing note reviewed.  Constitutional:      General: She is not in acute distress.    Appearance: She is well-developed. She is not ill-appearing, toxic-appearing or diaphoretic.  HENT:     Head: Normocephalic and atraumatic.  Eyes:     General: No scleral icterus.    Conjunctiva/sclera: Conjunctivae normal.  Cardiovascular:     Rate and Rhythm: Normal rate and regular rhythm.     Pulses: Normal pulses.     Heart sounds: No murmur heard. Pulmonary:     Effort: Pulmonary effort is normal. No respiratory distress.     Breath sounds: Normal breath sounds.  Abdominal:     Palpations: Abdomen is soft.     Tenderness: There is no abdominal tenderness.  Musculoskeletal:        General: No swelling.     Cervical back: Neck supple. No rigidity.     Right lower leg: No edema.     Left lower leg: No edema.  Skin:    General: Skin is warm and dry.     Capillary Refill: Capillary refill takes less than 2 seconds.     Coloration: Skin is not jaundiced or pale.     Findings: Wound present.     Comments: See photos  Neurological:     Mental Status: She is alert and oriented to person, place, and time.  Psychiatric:        Mood and Affect: Mood normal.     ED Results / Procedures / Treatments   Labs (all labs ordered are listed, but only abnormal results are displayed) Labs Reviewed  COMPREHENSIVE METABOLIC PANEL - Abnormal; Notable for the following components:      Result Value   Sodium 133 (*)    Chloride 97 (*)    Glucose, Bld 280 (*)    BUN 35 (*)    Total Protein 8.3 (*)    Alkaline Phosphatase 299 (*)    All other components within normal limits  CBG MONITORING, ED - Abnormal; Notable for the following components:   Glucose-Capillary 300 (*)    All other components within normal limits  CBC WITH DIFFERENTIAL/PLATELET  LIPASE, BLOOD    EKG None  Radiology CT Abdomen Pelvis W Contrast  Result Date:  04/06/2022 CLINICAL DATA:  Recurrent abscess with communication with the pancreas. Patient states she had a pseudocyst on her pancreas that extended into the left flank with drainage. The patient became septic and was hospitalized for a month. EXAM: CT ABDOMEN AND PELVIS WITH CONTRAST TECHNIQUE: Multidetector CT imaging of the abdomen and pelvis was performed using the standard protocol following bolus administration of intravenous contrast. RADIATION DOSE REDUCTION: This exam was performed according to the departmental dose-optimization program  which includes automated exposure control, adjustment of the mA and/or kV according to patient size and/or use of iterative reconstruction technique. CONTRAST:  146m OMNIPAQUE IOHEXOL 300 MG/ML  SOLN COMPARISON:  CT scan of the abdomen and pelvis January 22, 2022 and March 26, 2022 FINDINGS: Lower chest: No acute abnormality. Hepatobiliary: The liver demonstrates cirrhotic changes with a nodular contour. No liver mass identified. Portal vein is patent. The patient is status post cholecystectomy. Prominence of the common bile duct and central intrahepatic ducts is likely from previous cholecystectomy and is stable. Pancreas: Pancreatic head, neck, and body are stable. A few calcifications in the pancreatic head are likely due to previous pancreatitis. There is fluid near the pancreatic tail which could be in the talar adjacent. Pancreas is otherwise stable. Spleen: The spleen is prominent but stable. Adrenals/Urinary Tract: Adrenal glands are unremarkable. Kidneys are normal, without renal calculi, focal lesion, or hydronephrosis. Bladder is unremarkable. Stomach/Bowel: The stomach and small bowel are normal. Moderate fecal loading in the colon. The colon is otherwise unremarkable. The appendix is unremarkable. Vascular/Lymphatic: Calcified atherosclerotic changes are identified in the abdominal aorta, extending into the iliac and femoral vessels. No adenopathy. Splenic  varices are noted. Reproductive: Uterus and bilateral adnexa are unremarkable. Other: There is moderate ascites in the abdomen adjacent to the liver and spleen. There is also significant ascites in the pelvis. The amount of ascites is similar since March 26, 2022. There is a large peripherally enhancing complex fluid collection. The collection extends from near the tail of the pancreas posteriorly and inferiorly through the left posterior musculature, X into the subcutaneous fat. The collection extends to the skin surface. Again, the margins are irregular and the collection is difficult to measure. The collection is significantly larger compared to March 26, 2022 1 only a small amount of fluid remain near the pancreatic tail. However, the collection is a little smaller than the January 22, 2022 study. Musculoskeletal: No acute or significant osseous findings. IMPRESSION: 1. There is a large peripherally enhancing complex fluid collection in the left abdomen extending from the tail of the pancreas posteriorly and inferiorly into the subcutaneous fat. The collection is significantly larger compared to March 26, 2022. However, the collection is a little smaller than the January 22, 2022 study. The collection extends to the skin surface. This collection may represent a pseudocyst given previous history. An abscess could have a similar appearance. Whether the collection is sterile or infected cannot be determined on this study. There is no obvious air in the collection. 2. Cirrhosis with evidence of portal venous hypertension including splenic varices and moderate ascites. 3. Moderate fecal loading in the colon. 4. Calcified atherosclerotic changes in the abdominal aorta, iliac vessels, and femoral vessels. 5. Aortic atherosclerosis. Aortic Atherosclerosis (ICD10-I70.0). Electronically Signed   By: DDorise BullionIII M.D.   On: 04/06/2022 18:29    Procedures Procedures    Medications Ordered in  ED Medications  acetaminophen (TYLENOL) tablet 650 mg (650 mg Oral Given 04/06/22 1545)  iohexol (OMNIPAQUE) 300 MG/ML solution 100 mL (100 mLs Intravenous Contrast Given 04/06/22 1750)    ED Course/ Medical Decision Making/ A&P Clinical Course as of 04/06/22 2034  Fri Apr 06, 2022  2025 Consulted with Dr. TFlossie Buffyof hospitalist team.  Discussed patient's case in detail.  Still have not been able to consult with general surgery, awaiting callback.  However anticipate admission likely with medicine, she is in agreement.  Recommends beginning antibiotic therapy of vancomycin and Flagyl and agrees  with plan for admission.  Plan to keep updated with recommendations from general surgery once able to consult. [AC]  2030 Consulted with Dr. Ninfa Linden of general surgery.  Discussed patient's case and history in detail.  Agrees with plan for admission for further treatment and evaluation.  No further recommendations at this time.  Plan to come evaluate the patient tomorrow morning. [AC]  2032 Recontacted by Dr. Flossie Buffy via secure chat.  Antibiotic changed from Flagyl to Unasyn due to recent culture sensitivities and prior Hx of C. difficile.  Dr. Flossie Buffy aware of general surgery consultation and recommendations.   [AC]    Clinical Course User Index [AC] Prince Rome, PA-C                           Medical Decision Making Amount and/or Complexity of Data Reviewed Labs: ordered. Radiology: ordered.  Risk Prescription drug management. Decision regarding hospitalization.   61 y.o. female presents to the ED for concern of Wound Infection and Hyperglycemia     This involves an extensive number of treatment options, and is a complaint that carries with it a high risk of complications and morbidity.     Past Medical History / Co-morbidities / Social History: Hx of alcoholic cirrhosis of the liver and uncontrolled DMT2 Social Determinants of Health include: None  Additional History:  Obtained by chart review.   Notably recent ED and general surgery visits and notes, see for details.  Lab Tests: I ordered, and personally interpreted labs.  The pertinent results include:   6.4, without leukocytosis, no evidence of anemia Sodium 133, glucose 280, creatinine 0.93, BUN 35.  Elevated alk phos 299 CBG monitoring 300  Imaging Studies: I ordered imaging studies including CT abdomen and pelvis.   I independently visualized and interpreted imaging which showed  1. There is a large peripherally enhancing complex fluid collection in the left abdomen extending from the tail of the pancreas posteriorly and inferiorly into the subcutaneous fat. The collection is significantly larger compared to March 26, 2022. However, the collection is a little smaller than the January 22, 2022 study. The collection extends to the skin surface. This collection may represent a pseudocyst given previous history. An abscess could have a similar appearance. Whether the collection is sterile or infected cannot be determined on this study. There is no obvious air in the collection.  2. Cirrhosis with evidence of portal venous hypertension including splenic varices and moderate ascites.  3. Moderate fecal loading in the colon.  4. Calcified atherosclerotic changes in the abdominal aorta, iliac vessels, and femoral vessels.  5. Aortic atherosclerosis I agree with the radiologist interpretation.  Cardiac Monitoring: The patient was maintained on a cardiac monitor.  I personally viewed and interpreted the cardiac monitored which showed an underlying rhythm of: NSR  ED Course / Critical Interventions: Pt overall well-appearing on exam.  Nontoxic, nonseptic appearing in NAD.  Presenting to the ED with worsening recurrent abscess with cellulitis, with direct communication between pancreas and the epidermis.  First appeared in September 2023.  Multiple incision and drainages performed.  Currently being followed by Dr. Kieth Brightly of general  surgery, and has been in and out of the ED/hospital for same.  Noticed erythema, warmth, and tenderness increasing over the prior drainage site on the left mid thoracic region about 3 days ago.  Has continued to worsen since then.  See photos.  Without active drainage.  Significant tenderness, with firm increased tenderness  just superior to the wound in the photo.  Without abdominal tenderness, N/V, fever, headache, joint pain, neck stiffness, or changes in urinary/bowel habits.  Concern for reformation of abscess and possible complication.  Plan to proceed with CT imaging for further evaluation.  Does not meet SIRS or sepsis criteria at this time. No leukocytosis or evidence of anemia.  Without significant electrolyte derangement or evidence of AKI.  Elevated alk phos, the remaining LFTs appear unremarkable.  Lipase unremarkable, low suspicion of acute pancreatitis.  CT imaging indicates a large complex fluid collection near the pancreatic tail that is increased in size since 03/26/2022.  Unable to discern whether fluid collection is sterile or of infectious substance at this time, though no evidence of free air.  Anticipate admission for further evaluation and treatment. Consulted with general surgery and hospitalist team, see notes above.  Plan to proceed with admission.  Disposition: Admission  This chart was dictated using voice recognition software.  Despite best efforts to proofread, errors can occur which can change the documentation meaning.         Final Clinical Impression(s) / ED Diagnoses Final diagnoses:  Intra-abdominal abscess [facility])    Rx / DC Orders ED Discharge Orders     None         Candace Cruise 09/32/35 2326    Blanchie Dessert, MD 04/08/22 1353

## 2022-04-06 NOTE — Progress Notes (Signed)
Pharmacy Antibiotic Note  Mallory Stewart is a 61 y.o. female admitted on 04/06/2022 with  intra-abdominal infection .  Pharmacy has been consulted for ampicillin/sulbactam dosing.  Noted ceftriaxone allergy, but patient received ampicillin/sulbactam previously in from 01/22/22 - 01/29/22.   Today, 04/06/22 WBC WNL SCr WNL, CrCl ~65 mL/min Afebrile  Plan: Ampicillin/sulbactam 3 g IV q6h  Since renal function WNL, pharmacy to sign off. Please re-consult if needed.   Height: 5\' 8"  (172.7 cm) Weight: 64.4 kg (142 lb) IBW/kg (Calculated) : 63.9  Temp (24hrs), Avg:98.2 F (36.8 C), Min:98.2 F (36.8 C), Max:98.2 F (36.8 C)  Recent Labs  Lab 04/06/22 1048 04/06/22 1438  WBC  --  6.4  CREATININE 0.90 0.93    Estimated Creatinine Clearance: 65.7 mL/min (by C-G formula based on SCr of 0.93 mg/dL).    Allergies  Allergen Reactions   Ceftriaxone Hives    Immediate bright red itchy rash on bilateral legs after starting infusion.     Gluten Meal Other (See Comments)    Stomach distress   Zocor [Simvastatin] Other (See Comments)    Elevated lfts?   14/01/23, PharmD 04/06/2022 8:37 PM

## 2022-04-06 NOTE — ED Provider Triage Note (Signed)
Emergency Medicine Provider Triage Evaluation Note  Mallory Stewart , a 61 y.o. female  was evaluated in triage.  Pt complains of new redness, swelling, and possible infection of previous surgical site.  Began developing 2 days ago, and has continued to increase.  Per record review, admitted 01/22/2022 for abscess I&D with cellulitis and apparent communication with pancreatic pseudocyst.  Being followed by Dr. Sheliah Hatch of general surgery, has been seen several times for evaluation. Denies abdominal pain, N/V/D.  No changes in urinary or bowel habits, though does note her blood sugar has significantly increased over the last few days.  Usually runs in the 100s, however has been in the 400s.  Per patient recent CT scan from 03/26/2022 unremarkable for evidence of infection.    Known Hx of alcoholic cirrhosis of the liver and uncontrolled DMT2.    Sent with note from PCP for labs, CT abdomen and pelvis, and likely admission.  Review of Systems  Positive:  Negative: See above  Physical Exam  BP 127/89 (BP Location: Left Arm)   Pulse (!) 105   Temp 98.2 F (36.8 C) (Oral)   Resp 16   SpO2 100%  Gen:   Awake, no distress, sitting comfortably Resp:  Normal effort, CTAB, equal chest rise MSK:   Moves extremities without difficulty  Other:  See photo.    Medical Decision Making  Medically screening exam initiated at 1:52 PM.  Appropriate orders placed.  Mallory Stewart was informed that the remainder of the evaluation will be completed by another provider, this initial triage assessment does not replace that evaluation, and the importance of remaining in the ED until their evaluation is complete.  Does not meet sepsis or SIRS criteria at this time.  Labs and imaging ordered.  Continue to monitor.   Cecil Cobbs, PA-C 04/06/22 1408

## 2022-04-06 NOTE — H&P (Signed)
History and Physical    Patient: Mallory Stewart KYH:[id number] DOB: 09/18/1960 DOA: 04/06/2022 DOS: the patient was seen and examined on 04/07/2022 PCP: [name], MD  Patient coming from: Home  Chief Complaint:  Chief Complaint  Patient presents with   Wound Infection   Hyperglycemia   HPI: Mallory Stewart is a 61 y.o. female with medical history significant of hypertension, portal hypertension, gastric varices, chronic alcoholic pancreatitis with pseudocyst, alcoholic cirrhosis of the liver, type 2 diabetes with insulin-dependent, hyperlipidemia, C. difficile colitis who presents with recurrent flank abscess.  She was hospitalized from 01/22/2022 to 01/29/2022 with sepsis secondary to cellulitis and large abscess collection.  Previously hospitalized in August for infected enlarging pseudocyst. She was found to have cellulitis of the left flank with a large abscess within the left posterior pararenal space with extension to posterior left abdominal wall.  IR was consulted and she underwent ultrasound-guided aspiration of the left posterior abdominal wall collection.  General surgery did I&D on 9/1 and anaerobic/aerobic culture showed Klebsiella pneumoniae.  She was treated with Unasyn and discharged on Augmentin for 4 weeks per ID.  She was also prescribed oral vancomycin for C. difficile prophylaxis. Also underwent ERCP with GI on 9/23 to rule out pancreatic duct leak.  Found a possible distal pancreatic duct stricture and is status post biliary and pancreatic sphincterotomy and pancreatic duct stent placement.  Has repeat CT A/P on 03/26/2022 showing complete resolution of abscess in retroperitoneal space. However about 6 days ago she begin to feel flank pain and chills. Felt her back and knew that her infection had returned. She saw GI today and was advised to present to ED.   In the ED, temperature 98.5, heart rate of 90s to 105, normotensive on room air.  No leukocytosis or  anemia.  Sodium of 130, glucose of 255, normal AST and ALT, elevated alkaline phosphatase of 299.  Normal total bilirubin of 1.  CT abdomen pelvis showed large peripherally enhancing complex fluid collection that extends from the tail of the pancreas posteriorly and inferiorly through the left posterior musculature into the subcutaneous fat and skin surface.  There is moderate ascites in the abdomen adjacent to the liver and spleen.  ED PA discussed with general surgery Dr. Ninfa Linden who will see in consultation the morning. Review of Systems: As mentioned in the history of present illness. All other systems reviewed and are negative. Past Medical History:  Diagnosis Date   Alcohol-induced chronic pancreatitis (HCC)    Alcoholic cirrhosis (HCC)    B12 deficiency    Back abscess    C. difficile colitis    Concussion    DDD (degenerative disc disease), cervical    Diabetes ([place])    DKA, type 2 ([place])    Gastric outlet obstruction 08/12/2018   Hypertension    Neuropathy    Pancreatic pseudocyst/cyst 05/06/2013   Seasonal allergies    Past Surgical History:  Procedure Laterality Date   ANKLE ARTHROSCOPY Right 12/20/2020   Procedure: RIGHT ANKLE ARTHROSCOPIC DEBRIDEMENT;  Surgeon: Newt Minion, MD;  Location: [place];  Service: Orthopedics;  Laterality: Right;   BILIARY BRUSHING  01/27/2022   Procedure: BILIARY BRUSHING;  Surgeon: Rush Landmark Telford Nab., MD;  Location: [facility];  Service: Gastroenterology;;   BIOPSY  01/19/2019   Procedure: BIOPSY;  Surgeon: Irving Copas., MD;  Location: [place];  Service: Gastroenterology;;   CHOLECYSTECTOMY N/A 07/28/2016   Procedure: LAPAROSCOPIC CHOLECYSTECTOMY WITH INTRAOPERATIVE CHOLANGIOGRAM;  Surgeon: Lurena Joiner  Sondra Come, MD;  Location: [place];  Service: General;  Laterality: N/A;   ERCP N/A 01/27/2022   Procedure: ENDOSCOPIC RETROGRADE CHOLANGIOPANCREATOGRAPHY (ERCP);  Surgeon: Irving Copas., MD;   Location: [facility];  Service: Gastroenterology;  Laterality: N/A;   ESOPHAGOGASTRODUODENOSCOPY (EGD) WITH PROPOFOL N/A 08/13/2018   Procedure: ESOPHAGOGASTRODUODENOSCOPY (EGD) WITH PROPOFOL;  Surgeon: Rush Landmark Telford Nab., MD;  Location: [place];  Service: Gastroenterology;  Laterality: N/A;   ESOPHAGOGASTRODUODENOSCOPY (EGD) WITH PROPOFOL N/A 01/19/2019   Procedure: ESOPHAGOGASTRODUODENOSCOPY (EGD) WITH PROPOFOL;  Surgeon: Rush Landmark Telford Nab., MD;  Location: [place];  Service: Gastroenterology;  Laterality: N/A;   ESOPHAGOGASTRODUODENOSCOPY (EGD) WITH PROPOFOL N/A 12/28/2021   Procedure: ESOPHAGOGASTRODUODENOSCOPY (EGD) WITH PROPOFOL;  Surgeon: Rush Landmark Telford Nab., MD;  [facility];  Service: Gastroenterology;  Laterality: N/A;   EUS N/A 12/28/2021   Procedure: UPPER ENDOSCOPIC ULTRASOUND (EUS) LINEAR;  Surgeon: Irving Copas., MD;  [facility];  Service: Gastroenterology;  Laterality: N/A;   INCISION AND DRAINAGE ABSCESS N/A 01/25/2022   Procedure: INCISION AND DRAINAGE BACK ABSCESS;  Surgeon: Kieth Brightly, Arta Bruce, MD;  [facility];  Service: General;  Laterality: N/A;  90 L DOW   IR PARACENTESIS  08/08/2020   IR PARACENTESIS  03/13/2021   IR PARACENTESIS  02/23/2022   IR US GUIDE BX ASP/DRAIN  01/23/2022   LEEP  1990's   ORIF FOOT FRACTURE  09/2008   L 5th metatarsal   PANCREATIC STENT PLACEMENT  01/27/2022   Procedure: PANCREATIC STENT PLACEMENT;  Surgeon: Irving Copas., MD;  Location: [facility];  Service: Gastroenterology;;   REFRACTIVE SURGERY  2000   REMOVAL OF STONES  01/27/2022   Procedure: REMOVAL OF STONES;  Surgeon: Irving Copas., MD;  Location: [facility];  Service: Gastroenterology;;   Joan Mayans  01/27/2022   Procedure: Joan Mayans;  Surgeon: Irving Copas., MD;  Location: [facility];  Service: Gastroenterology;;   TIBIA IM NAIL INSERTION Left 03/12/2021   Procedure: INTRAMEDULLARY (IM)  NAIL TIBIAL;  Surgeon: Jessy Oto, MD;  Location: [place];  Service: Orthopedics;  Laterality: Left;   TONSILLECTOMY     UPPER ESOPHAGEAL ENDOSCOPIC ULTRASOUND (EUS) N/A 08/13/2018   Procedure: UPPER ESOPHAGEAL ENDOSCOPIC ULTRASOUND (EUS);  Surgeon: Irving Copas., MD;  Location: [place];  Service: Gastroenterology;  Laterality: N/A;   UPPER ESOPHAGEAL ENDOSCOPIC ULTRASOUND (EUS) N/A 01/19/2019   Procedure: UPPER ESOPHAGEAL ENDOSCOPIC ULTRASOUND (EUS);  Surgeon: Irving Copas., MD;  Location: [place];  Service: Gastroenterology;  Laterality: N/A;   Social History:  reports that she has never smoked. She has never used smokeless tobacco. She reports that she does not currently use alcohol after a past usage of about 7.0 standard drinks of alcohol per week. She reports that she does not use drugs.  Allergies  Allergen Reactions   Ceftriaxone Hives    Immediate bright red itchy rash on bilateral legs after starting infusion.     Gluten Meal Other (See Comments)    Stomach distress   Zocor [Simvastatin] Other (See Comments)    Elevated lfts?    Family History  Problem Relation Age of Onset   Other Mother        tachycardia.Marland KitchenMarland Kitchen?afib   Stroke Father        after hernia suegery   Prostate cancer Father    Other Father        global transient amnesia, unclear source   Atrial fibrillation Sister    Healthy Brother    Healthy Brother  Coronary artery disease Paternal Grandmother    Heart attack Paternal Grandmother 67   Brain cancer Maternal Grandfather        ?   Cancer Paternal Grandfather        ?   Breast cancer Maternal Grandmother     Prior to Admission medications   Medication Sig Start Date End Date Taking? Authorizing Provider  ALPRAZolam (XANAX) 0.25 MG tablet Take 1 tablet (0.25 mg total) by mouth 2 (two) times daily as needed for anxiety. Please ignore other prescription as change made. Patient taking differently: Take 0.25 mg by mouth every 12  (twelve) hours as needed for anxiety. 04/05/22  Yes Bedsole, Amy E, MD  CALCIUM PO Take 1 tablet by mouth in the morning and at bedtime.   Yes [provider]  cetirizine (ZYRTEC) 10 MG tablet Take 10 mg by mouth at bedtime.   Yes [provider]  Cholecalciferol (VITAMIN D3 PO) Take 2 capsules by mouth in the morning and at bedtime.   Yes [provider]  Cyanocobalamin (VITAMIN B-12) 5000 MCG SUBL Place 5,000 mcg under the tongue daily.   Yes [provider]  furosemide (LASIX) 20 MG tablet Take 1 tablet (20 mg total) by mouth daily. Patient taking differently: Take 20 mg by mouth in the morning. 03/15/22  Yes Gatha Mayer, MD  Insulin Glargine [facility] KWIKPEN) 100 UNIT/ML Inject 30 Units into the skin daily. 04/06/22  Yes Shamleffer, Melanie Crazier, MD  insulin lispro (HUMALOG KWIKPEN) 100 UNIT/ML KwikPen Max daily 30 units Patient taking differently: 6 Units See admin instructions. Inject 6 units into the skin three times a day with meals, PER SLIDING SCALE 04/06/22  Yes Shamleffer, Melanie Crazier, MD  spironolactone (ALDACTONE) 50 MG tablet Take 1 tablet (50 mg total) by mouth daily. 03/15/22  Yes Gatha Mayer, MD  traZODone (DESYREL) 100 MG tablet Take 0.5-1 tablets (50-100 mg total) by mouth at bedtime. Patient taking differently: Take 100 mg by mouth at bedtime. 03/26/22  Yes Copland, Frederico Hamman, MD  amoxicillin-clavulanate (AUGMENTIN) 875-125 MG tablet Take 1 tablet by mouth 2 (two) times daily. Patient not taking: Reported on 04/06/2022 02/22/22   Tommy Medal, Lavell Islam, MD  Blood Glucose Monitoring Suppl (ONE TOUCH ULTRA 2) w/Device KIT Use to check blood sugars two times day 08/01/18   Bedsole, Amy E, MD  glucose blood (ONETOUCH ULTRA) test strip CHECK BLOOD SUGAR TWICE DAILY AS DIRECTED 12/13/21   Bedsole, Amy E, MD  Insulin Pen Needle 32G X 4 MM MISC 1 Device by Does not apply route in the morning, at noon, in the evening, and at bedtime. 04/06/22    Shamleffer, Melanie Crazier, MD  Lancets [facility] ULTRASOFT) lancets Use to check blood sugar two times a day. 08/01/18   Bedsole, Amy E, MD  vancomycin (VANCOCIN) 125 MG capsule Take 1 capsule (125 mg total) by mouth daily. Patient not taking: Reported on 04/06/2022 02/22/22   Truman Hayward, MD    Physical Exam: Vitals:   04/06/22 1829 04/06/22 2122 04/06/22 2145 04/06/22 2152  BP: 117/82 (!) 146/102 (!) 136/96   Pulse: 99 93 99 94  Resp: 16 18    Temp: 98.2 F (36.8 C) 98.5 F (36.9 C)    TempSrc: Oral Oral    SpO2: 100% 100% 100% 100%  Weight:      Height:       Constitutional: NAD, calm, comfortable, nontoxic appearing middle-age female sitting upright Eyes: lids and conjunctivae normal ENMT:  Mucous membranes are moist.  Neck: normal, supple Respiratory: clear to auscultation bilaterally, no wheezing, no crackles. Normal respiratory effort. No accessory muscle use.  Cardiovascular: Regular rate and rhythm, no murmurs / rubs / gallops. No extremity edema.   Abdomen: Soft, mildly distended. no tenderness,  Bowel sounds positive.  Musculoskeletal: no clubbing / cyanosis. No joint deformity upper and lower extremities. Good ROM, no contractures. Normal muscle tone.  Skin: Large erythematous firm indurated lesion to left flank without any opening or drainage Neurologic: CN 2-12 grossly intact.  Strength 5/5 in all 4.  Psychiatric: Normal judgment and insight. Alert and oriented x 3. Normal mood. Data Reviewed:  See HPI  Assessment and Plan: * Abscess of flank -Hx of large left posterior abdominal wall abscess in September requiring IR ultrasound-guided aspiration and I&D with general surgery. Anaerobic/aerobic culture at that time positive for Klebsiella pneumoniae. -CT abdomen pelvis today showed large peripherally enhancing complex fluid collection that extends from the tail of the pancreas posteriorly and inferiorly through the left posterior musculature into the  subcutaneous fat and skin surface.  There is moderate ascites in the abdomen adjacent to the liver and spleen. -will start IV Unasyn as she has sensitivity to it with the past retroperitoneal abscess -General surgery Dr. Rush Farmer consulted and will see in consultation in the morning   Type 2 diabetes mellitus with hyperglycemia, with long-term current use of insulin (HCC) -Home regimen includes Basaglar 30 U with humalog SSI  -start resistant SSI for now while she is NPO for anticipated procedure in the morning  Alcoholic cirrhosis of liver with ascites ([place]) -CT A/P shows moderate ascites around liver and spleen but likely due to ongoing infection. Does not appear to have liver decompensation at this time. -continue Lasix and Spirolactone      Advance Care Planning:   Code Status: Full Code   Consults: General surgery  Family Communication: none at bedside  Severity of Illness: The appropriate patient status for this patient is INPATIENT. Inpatient status is judged to be reasonable and necessary in order to provide the required intensity of service to ensure the patient's safety. The patient's presenting symptoms, physical exam findings, and initial radiographic and laboratory data in the context of their chronic comorbidities is felt to place them at high risk for further clinical deterioration. Furthermore, it is not anticipated that the patient will be medically stable for discharge from the hospital within 2 midnights of admission.   * I certify that at the point of admission it is my clinical judgment that the patient will require inpatient hospital care spanning beyond 2 midnights from the point of admission due to high intensity of service, high risk for further deterioration and high frequency of surveillance required.*  Author: Orene Desanctis, DO 04/07/2022 12:59 AM  For on call review [email or web address].

## 2022-04-06 NOTE — Patient Instructions (Addendum)
  Continue  Basaglar 30 units daily  Increased Novolog 8 units with each meal ( Breakfast, Lunch and Supper ) Novolog correctional insulin: ADD extra units on insulin to your meal-time Novolog dose if your blood sugars are higher than 160. Use the scale below to help guide you:   Blood sugar before meal Number of units to inject  Less than 160 0 unit  161 -  190 1 units  191 -  220 2 units  221 -  250 3 units  251 -  280 4 units  281 -  310 5 units  311 -  340 6 units  341 -  370 7 units  371 -  400 8 units  401 - 430 9 units   431 - 460 10 units     HOW TO TREAT LOW BLOOD SUGARS (Blood sugar LESS THAN 70 MG/DL) Please follow the RULE OF 15 for the treatment of hypoglycemia treatment (when your (blood sugars are less than 70 mg/dL)   STEP 1: Take 15 grams of carbohydrates when your blood sugar is low, which includes:  3-4 GLUCOSE TABS  OR 3-4 OZ OF JUICE OR REGULAR SODA OR ONE TUBE OF GLUCOSE GEL    STEP 2: RECHECK blood sugar in 15 MINUTES STEP 3: If your blood sugar is still low at the 15 minute recheck --> then, go back to STEP 1 and treat AGAIN with another 15 grams of carbohydrates.

## 2022-04-06 NOTE — Patient Instructions (Signed)
Please go to the emergency department at [facility] for evaluation and treatment. I think you will need labs, CT scan and admission for care.  Iva Boop, MD, Clementeen Graham

## 2022-04-06 NOTE — ED Notes (Signed)
Pt taken to CT. IV started and scan completed. Pt placed back in the waiting room with IV still in place. Communicated with the pt to not leave with the IV still in place.  

## 2022-04-06 NOTE — Progress Notes (Deleted)
Mallory Stewart 61 y.o. Oct 12, 1960 [id number]  Assessment & Plan:      Subjective:   Chief Complaint:  HPI  Wt Readings from Last 3 Encounters:  04/06/22 142 lb (64.4 kg)  04/06/22 142 lb 3.2 oz (64.5 kg)  03/21/22 146 lb 2 oz (66.3 kg)     Allergies  Allergen Reactions   Ceftriaxone Hives    Immediate bright red itchy rash on bilateral legs after starting infusion.     Gluten Meal Other (See Comments)    Stomach distress   Zocor [Simvastatin] Other (See Comments)    Elevated lfts?   Current Meds  Medication Sig   ALPRAZolam (XANAX) 0.25 MG tablet Take 1 tablet (0.25 mg total) by mouth 2 (two) times daily as needed for anxiety. Please ignore other prescription as change made.   Blood Glucose Monitoring Suppl (ONE TOUCH ULTRA 2) w/Device KIT Use to check blood sugars two times day   Calcium Carb-Cholecalciferol (CALCIUM+D3 PO) Take 1 tablet by mouth daily.   cetirizine (ZYRTEC) 10 MG tablet Take 10 mg by mouth at bedtime.   cholecalciferol (VITAMIN D3) 25 MCG (1000 UNIT) tablet Take 1,000 Units by mouth daily.   cyanocobalamin 2000 MCG tablet Take 2,000 mcg by mouth daily.   furosemide (LASIX) 20 MG tablet Take 1 tablet (20 mg total) by mouth daily.   glucose blood (ONETOUCH ULTRA) test strip CHECK BLOOD SUGAR TWICE DAILY AS DIRECTED   Insulin Glargine (BASAGLAR KWIKPEN) 100 UNIT/ML Inject 30 Units into the skin daily.   insulin lispro (HUMALOG KWIKPEN) 100 UNIT/ML KwikPen Max daily 30 units   Insulin Pen Needle 32G X 4 MM MISC 1 Device by Does not apply route in the morning, at noon, in the evening, and at bedtime.   Lancets (ONETOUCH ULTRASOFT) lancets Use to check blood sugar two times a day.   meclizine (ANTIVERT) 25 MG tablet Take 25 mg by mouth daily as needed for dizziness.   Melatonin 10 MG TABS Take 20 mg by mouth at bedtime.   spironolactone (ALDACTONE) 50 MG tablet Take 1 tablet (50 mg total) by mouth daily.   traZODone (DESYREL) 100 MG tablet Take  0.5-1 tablets (50-100 mg total) by mouth at bedtime.   Past Medical History:  Diagnosis Date   Alcohol-induced chronic pancreatitis (HCC)    Alcoholic cirrhosis ([place])    B12 deficiency    Concussion    DDD (degenerative disc disease), cervical    Diabetes ([place])    DKA, type 2 ([place])    Gastric outlet obstruction 08/12/2018   Hypertension    Neuropathy    Pancreatic pseudocyst/cyst 05/06/2013   Seasonal allergies    Past Surgical History:  Procedure Laterality Date   ANKLE ARTHROSCOPY Right 12/20/2020   Procedure: RIGHT ANKLE ARTHROSCOPIC DEBRIDEMENT;  Surgeon: Newt Minion, MD;  Location: [place];  Service: Orthopedics;  Laterality: Right;   BILIARY BRUSHING  01/27/2022   Procedure: BILIARY BRUSHING;  Surgeon: Rush Landmark Telford Nab., MD;  Location: [facility];  Service: Gastroenterology;;   BIOPSY  01/19/2019   Procedure: BIOPSY;  Surgeon: Irving Copas., MD;  Location: [place];  Service: Gastroenterology;;   CHOLECYSTECTOMY N/A 07/28/2016   Procedure: LAPAROSCOPIC CHOLECYSTECTOMY WITH INTRAOPERATIVE CHOLANGIOGRAM;  Surgeon: Mickeal Skinner, MD;  Location: [place];  Service: General;  Laterality: N/A;   ERCP N/A 01/27/2022   Procedure: ENDOSCOPIC RETROGRADE CHOLANGIOPANCREATOGRAPHY (ERCP);  Surgeon: Irving Copas., MD;  Location: [facility];  Service: Gastroenterology;  Laterality: N/A;  ESOPHAGOGASTRODUODENOSCOPY (EGD) WITH PROPOFOL N/A 08/13/2018   Procedure: ESOPHAGOGASTRODUODENOSCOPY (EGD) WITH PROPOFOL;  Surgeon: Rush Landmark Telford Nab., MD;  Location: [place];  Service: Gastroenterology;  Laterality: N/A;   ESOPHAGOGASTRODUODENOSCOPY (EGD) WITH PROPOFOL N/A 01/19/2019   Procedure: ESOPHAGOGASTRODUODENOSCOPY (EGD) WITH PROPOFOL;  Surgeon: Rush Landmark Telford Nab., MD;  Location: [place];  Service: Gastroenterology;  Laterality: N/A;   ESOPHAGOGASTRODUODENOSCOPY (EGD) WITH PROPOFOL N/A 12/28/2021   Procedure:  ESOPHAGOGASTRODUODENOSCOPY (EGD) WITH PROPOFOL;  Surgeon: Rush Landmark Telford Nab., MD;  [facility];  Service: Gastroenterology;  Laterality: N/A;   EUS N/A 12/28/2021   Procedure: UPPER ENDOSCOPIC ULTRASOUND (EUS) LINEAR;  Surgeon: Irving Copas., MD;  [facility];  Service: Gastroenterology;  Laterality: N/A;   INCISION AND DRAINAGE ABSCESS N/A 01/25/2022   Procedure: INCISION AND DRAINAGE BACK ABSCESS;  Surgeon: Kieth Brightly, Arta Bruce, MD;  [facility];  Service: General;  Laterality: N/A;  90 L DOW   IR PARACENTESIS  08/08/2020   IR PARACENTESIS  03/13/2021   IR PARACENTESIS  02/23/2022   IR US GUIDE BX ASP/DRAIN  01/23/2022   LEEP  1990's   ORIF FOOT FRACTURE  09/2008   L 5th metatarsal   PANCREATIC STENT PLACEMENT  01/27/2022   Procedure: PANCREATIC STENT PLACEMENT;  Surgeon: Irving Copas., MD;  Location: [facility];  Service: Gastroenterology;;   REFRACTIVE SURGERY  2000   REMOVAL OF STONES  01/27/2022   Procedure: REMOVAL OF STONES;  Surgeon: Irving Copas., MD;  Location: [facility];  Service: Gastroenterology;;   Joan Mayans  01/27/2022   Procedure: Joan Mayans;  Surgeon: Irving Copas., MD;  Location: [facility];  Service: Gastroenterology;;   TIBIA IM NAIL INSERTION Left 03/12/2021   Procedure: INTRAMEDULLARY (IM) NAIL TIBIAL;  Surgeon: Jessy Oto, MD;  Location: [place];  Service: Orthopedics;  Laterality: Left;   TONSILLECTOMY     UPPER ESOPHAGEAL ENDOSCOPIC ULTRASOUND (EUS) N/A 08/13/2018   Procedure: UPPER ESOPHAGEAL ENDOSCOPIC ULTRASOUND (EUS);  Surgeon: Irving Copas., MD;  Location: [place];  Service: Gastroenterology;  Laterality: N/A;   UPPER ESOPHAGEAL ENDOSCOPIC ULTRASOUND (EUS) N/A 01/19/2019   Procedure: UPPER ESOPHAGEAL ENDOSCOPIC ULTRASOUND (EUS);  Surgeon: Irving Copas., MD;  Location: [employer];  Service: Gastroenterology;  Laterality: N/A;   Social History   Social  History Narrative   Patient is married one stepson    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 not traveling   regular exercise , walking 4-5 times a week   Diet fruits and veggies, water   Alcohol at least several glasses of wine a week Stopped 08/2018   Never smoker, no drug use   Right handed   One story home   [place] in the am   family history includes Atrial fibrillation in her sister; Brain cancer in her maternal grandfather; Breast cancer in her maternal grandmother; Cancer in her paternal grandfather; Coronary artery disease in her paternal grandmother; Healthy in her brother and brother; Heart attack (age of onset: 77) in her paternal grandmother; Other in her father and mother; Prostate cancer in her father; Stroke in her father.   Review of Systems   Objective:   Physical Exam

## 2022-04-06 NOTE — H&P (Incomplete)
History and Physical    Patient: Mallory Stewart OIL:[id number] DOB: Aug 05, 1960 DOA: 04/06/2022 DOS: the patient was seen and examined on 04/06/2022 PCP: [name], MD  Patient coming from: Home  Chief Complaint:  Chief Complaint  Patient presents with  . Wound Infection  . Hyperglycemia   HPI: [name] is a 61 y.o. female with medical history significant of hypertension, portal hypertension, gastric varices, chronic alcoholic pancreatitis with pseudocyst, alcoholic cirrhosis of the liver, type 2 diabetes with insulin-dependent, hyperlipidemia, C. difficile colitis who presents with recurrent flank abscess.  She was hospitalized from 01/22/2022 to 01/29/2022 with sepsis secondary to cellulitis and infected pseudocyst.  She was found to have cellulitis of the left flank with a large abscess within the left posterior pararenal space with extension to posterior left abdominal wall.  IR was consulted and she underwent ultrasound-guided aspiration of the left posterior abdominal wall collection.  General surgery did I&D on 9/1 and anaerobic/aerobic culture showed Klebsiella pneumoniae.  She was treated with Unasyn and discharged on Augmentin for 4 weeks per ID.  She was also prescribed oral vancomycin for C. difficile prophylaxis. Also underwent ERCP with GI on 9/23 to rule out pancreatic duct leak.  Found a possible distal pancreatic duct stricture and is status post biliary and pancreatic sphincterotomy and pancreatic duct stent placement.  In the ED, temperature 98.5, heart rate of 90s to 105, normotensive on room air.  No leukocytosis or anemia.  Sodium of 130, glucose of 255, normal AST and ALT, elevated alkaline phosphatase of 299.  Normal total bilirubin of 1.  CT abdomen pelvis showed large peripherally enhancing complex fluid collection that extends from the tail of the pancreas posteriorly and inferiorly through the left posterior musculature into the subcutaneous fat and  skin surface.  There is moderate ascites in the abdomen adjacent to the liver and spleen.  ED PA discussed with general surgery Dr. Ninfa Linden who will see in consultation the morning. Review of Systems: {ROS_Text:26778} Past Medical History:  Diagnosis Date  . Alcohol-induced chronic pancreatitis ([place])   . Alcoholic cirrhosis ([place])   . B12 deficiency   . Back abscess   . C. difficile colitis   . Concussion   . DDD (degenerative disc disease), cervical   . Diabetes ([place])   . DKA, type 2 ([place])   . Gastric outlet obstruction 08/12/2018  . Hypertension   . Neuropathy   . Pancreatic pseudocyst/cyst 05/06/2013  . Seasonal allergies    Past Surgical History:  Procedure Laterality Date  . ANKLE ARTHROSCOPY Right 12/20/2020   Procedure: RIGHT ANKLE ARTHROSCOPIC DEBRIDEMENT;  Surgeon: Newt Minion, MD;  Location: [place];  Service: Orthopedics;  Laterality: Right;  . BILIARY BRUSHING  01/27/2022   Procedure: BILIARY BRUSHING;  Surgeon: Rush Landmark Telford Nab., MD;  [facility];  Service: Gastroenterology;;  . BIOPSY  01/19/2019   Procedure: BIOPSY;  Surgeon: Irving Copas., MD;  Location: [place];  Service: Gastroenterology;;  . Lorin Mercy N/A 07/28/2016   Procedure: LAPAROSCOPIC CHOLECYSTECTOMY WITH INTRAOPERATIVE CHOLANGIOGRAM;  Surgeon: Mickeal Skinner, MD;  Location: [place];  Service: General;  Laterality: N/A;  . ERCP N/A 01/27/2022   Procedure: ENDOSCOPIC RETROGRADE CHOLANGIOPANCREATOGRAPHY (ERCP);  Surgeon: Irving Copas., MD;  Location: [facility];  Service: Gastroenterology;  Laterality: N/A;  . ESOPHAGOGASTRODUODENOSCOPY (EGD) WITH PROPOFOL N/A 08/13/2018   Procedure: ESOPHAGOGASTRODUODENOSCOPY (EGD) WITH PROPOFOL;  Surgeon: Rush Landmark Telford Nab., MD;  Location: [place];  Service: Gastroenterology;  Laterality: N/A;  .  ESOPHAGOGASTRODUODENOSCOPY (EGD) WITH PROPOFOL N/A 01/19/2019   Procedure: ESOPHAGOGASTRODUODENOSCOPY  (EGD) WITH PROPOFOL;  Surgeon: Rush Landmark Telford Nab., MD;  Location: [place];  Service: Gastroenterology;  Laterality: N/A;  . ESOPHAGOGASTRODUODENOSCOPY (EGD) WITH PROPOFOL N/A 12/28/2021   Procedure: ESOPHAGOGASTRODUODENOSCOPY (EGD) WITH PROPOFOL;  Surgeon: Rush Landmark Telford Nab., MD;  [facility];  Service: Gastroenterology;  Laterality: N/A;  . EUS N/A 12/28/2021   Procedure: UPPER ENDOSCOPIC ULTRASOUND (EUS) LINEAR;  Surgeon: Irving Copas., MD;  [facility];  Service: Gastroenterology;  Laterality: N/A;  . INCISION AND DRAINAGE ABSCESS N/A 01/25/2022   Procedure: INCISION AND DRAINAGE BACK ABSCESS;  Surgeon: Kieth Brightly, Arta Bruce, MD;  [facility];  Service: General;  Laterality: N/A;  90 L DOW  . IR PARACENTESIS  08/08/2020  . IR PARACENTESIS  03/13/2021  . IR PARACENTESIS  02/23/2022  . IR US GUIDE BX ASP/DRAIN  01/23/2022  . LEEP  1990's  . ORIF FOOT FRACTURE  09/2008   L 5th metatarsal  . PANCREATIC STENT PLACEMENT  01/27/2022   Procedure: PANCREATIC STENT PLACEMENT;  Surgeon: Irving Copas., MD;  [facility];  Service: Gastroenterology;;  . [place]  2000  . REMOVAL OF STONES  01/27/2022   Procedure: REMOVAL OF STONES;  Surgeon: Rush Landmark Telford Nab., MD;  Location: [facility];  Service: Gastroenterology;;  . Joan Mayans  01/27/2022   Procedure: Joan Mayans;  Surgeon: Irving Copas., MD;  Location: [facility];  Service: Gastroenterology;;  . TIBIA IM NAIL INSERTION Left 03/12/2021   Procedure: INTRAMEDULLARY (IM) NAIL TIBIAL;  Surgeon: Jessy Oto, MD;  Location: [place];  Service: Orthopedics;  Laterality: Left;  . TONSILLECTOMY    . UPPER ESOPHAGEAL ENDOSCOPIC ULTRASOUND (EUS) N/A 08/13/2018   Procedure: UPPER ESOPHAGEAL ENDOSCOPIC ULTRASOUND (EUS);  Surgeon: Irving Copas., MD;  Location: [place];  Service: Gastroenterology;  Laterality: N/A;  . UPPER ESOPHAGEAL ENDOSCOPIC  ULTRASOUND (EUS) N/A 01/19/2019   Procedure: UPPER ESOPHAGEAL ENDOSCOPIC ULTRASOUND (EUS);  Surgeon: Irving Copas., MD;  Location: [place];  Service: Gastroenterology;  Laterality: N/A;   Social History:  reports that she has never smoked. She has never used smokeless tobacco. She reports that she does not currently use alcohol after a past usage of about 7.0 standard drinks of alcohol per week. She reports that she does not use drugs.  Allergies  Allergen Reactions  . Ceftriaxone Hives    Immediate bright red itchy rash on bilateral legs after starting infusion.    . Gluten Meal Other (See Comments)    Stomach distress  . Zocor [Simvastatin] Other (See Comments)    Elevated lfts?    Family History  Problem Relation Age of Onset  . Other Mother        tachycardia.Marland KitchenMarland Kitchen?afib  . Stroke Father        after hernia suegery  . Prostate cancer Father   . Other Father        global transient amnesia, unclear source  . Atrial fibrillation Sister   . Healthy Brother   . Healthy Brother   . Coronary artery disease Paternal Grandmother   . Heart attack Paternal Grandmother 73  . Brain cancer Maternal Grandfather        ?  Marland Kitchen Cancer Paternal Grandfather        ?  Marland Kitchen Breast cancer Maternal Grandmother     Prior to Admission medications   Medication Sig Start Date End Date Taking? Authorizing Provider  ALPRAZolam Duanne Moron) 0.25 MG tablet Take 1 tablet (  0.25 mg total) by mouth 2 (two) times daily as needed for anxiety. Please ignore other prescription as change made. Patient taking differently: Take 0.25 mg by mouth every 12 (twelve) hours as needed for anxiety. 04/05/22  Yes Bedsole, Amy E, MD  CALCIUM PO Take 1 tablet by mouth in the morning and at bedtime.   Yes [provider]  cetirizine (ZYRTEC) 10 MG tablet Take 10 mg by mouth at bedtime.   Yes [provider]  Cholecalciferol (VITAMIN D3 PO) Take 2 capsules by mouth in the morning and at bedtime.   Yes  [provider]  Cyanocobalamin (VITAMIN B-12) 5000 MCG SUBL Place 5,000 mcg under the tongue daily.   Yes [provider]  furosemide (LASIX) 20 MG tablet Take 1 tablet (20 mg total) by mouth daily. Patient taking differently: Take 20 mg by mouth in the morning. 03/15/22  Yes Gatha Mayer, MD  Insulin Glargine [facility] KWIKPEN) 100 UNIT/ML Inject 30 Units into the skin daily. 04/06/22  Yes Shamleffer, Melanie Crazier, MD  insulin lispro (HUMALOG KWIKPEN) 100 UNIT/ML KwikPen Max daily 30 units Patient taking differently: 6 Units See admin instructions. Inject 6 units into the skin three times a day with meals, PER SLIDING SCALE 04/06/22  Yes Shamleffer, Melanie Crazier, MD  spironolactone (ALDACTONE) 50 MG tablet Take 1 tablet (50 mg total) by mouth daily. 03/15/22  Yes Gatha Mayer, MD  traZODone (DESYREL) 100 MG tablet Take 0.5-1 tablets (50-100 mg total) by mouth at bedtime. Patient taking differently: Take 100 mg by mouth at bedtime. 03/26/22  Yes Copland, Frederico Hamman, MD  amoxicillin-clavulanate (AUGMENTIN) 875-125 MG tablet Take 1 tablet by mouth 2 (two) times daily. Patient not taking: Reported on 04/06/2022 02/22/22   Tommy Medal, Lavell Islam, MD  Blood Glucose Monitoring Suppl (ONE TOUCH ULTRA 2) w/Device KIT Use to check blood sugars two times day 08/01/18   Bedsole, Amy E, MD  glucose blood (ONETOUCH ULTRA) test strip CHECK BLOOD SUGAR TWICE DAILY AS DIRECTED 12/13/21   Bedsole, Amy E, MD  Insulin Pen Needle 32G X 4 MM MISC 1 Device by Does not apply route in the morning, at noon, in the evening, and at bedtime. 04/06/22   Shamleffer, Melanie Crazier, MD  Lancets [facility] ULTRASOFT) lancets Use to check blood sugar two times a day. 08/01/18   Bedsole, Amy E, MD  vancomycin (VANCOCIN) 125 MG capsule Take 1 capsule (125 mg total) by mouth daily. Patient not taking: Reported on 04/06/2022 02/22/22   Truman Hayward, MD    Physical Exam: Vitals:   04/06/22 1829 04/06/22 2122  04/06/22 2145 04/06/22 2152  BP: 117/82 (!) 146/102 (!) 136/96   Pulse: 99 93 99 94  Resp: 16 18    Temp: 98.2 F (36.8 C) 98.5 F (36.9 C)    TempSrc: Oral Oral    SpO2: 100% 100% 100% 100%  Weight:      Height:       *** Data Reviewed: {Tip this will not be part of the note when signed- Document your independent interpretation of telemetry tracing, EKG, lab, Radiology test or any other diagnostic tests. Add any new diagnostic test ordered today. (Optional):26781} {Results:26384}  Assessment and Plan: No notes have been filed under this hospital service. Service: Hospitalist     Advance Care Planning:   Code Status: Prior ***  Consults: ***  Family Communication: ***  Severity of Illness: {Observation/Inpatient:21159}  Author: Orene Desanctis, DO 04/06/2022 11:52 PM  For  on call review [email or web address].

## 2022-04-06 NOTE — Progress Notes (Signed)
Name: Mallory Stewart  Age/ Sex: 61 y.o., female   MRN/ DOB: [id number], 1960-11-05     PCP: [name], MD   Reason for Endocrinology Evaluation: Type 2 Diabetes Mellitus  Initial Endocrine Consultative Visit: 08/22/2018    PATIENT IDENTIFIER: Mallory Stewart is a 61 y.o. female with a past medical history of HTN, T2DM , chronic pancreatitis and gastric outlet obstruction and alcoholic cirrhosis. The patient has followed with Endocrinology clinic since 08/22/2018 for consultative assistance with management of her diabetes.  DIABETIC HISTORY:  Mallory Stewart was with  T2DM in 2010.Pt had hospital admission in 2014 for pancreatitis secondary to ETOH intake, with another one in 2018 and 2020.  Insulin started in 07/2018 . Her hemoglobin A1c has ranged from 5.4% in 2016, peaking at 13.3%  In 2020.  Prandial insulin started 08/2018 Wilder Glade added in 09/2018 but stopped due to cost 2023   Started MDI regimen July 2023  SUBJECTIVE:   During the last visit (11/20/2021): A1c 12.5%      Today (04/06/2022): Mallory Stewart is here for a follow up on diabetes management. She checks her blood sugars 4-5 times daily but did not bring her meter today. The patient has hypoglycemic episodes prior to thanksgiving but since than she endorses hyperglycemia in the 400's   She continues to follow-up with gastroenterology for alcoholic liver cirrhosis She was seen by surgery for a back abscess 03/22/2022 Has been on Abx  for months     HOME DIABETES REGIMEN:  Basaglar 24 units daily - she increased it to 30  about 3 days ago  Humalog  6 units 3 times daily before every meal Correction factor : NovoLog (BG -130/30)    Statin: No ACE-I/ARB: yes    GLUCOSE LOG:  Fasting this am 132 mg/dL     DIABETIC COMPLICATIONS: Microvascular complications:  Neuropathic , S/P cataract sx Denies: CKD Last Eye Exam: Completed 2023  Macrovascular complications:  Denies: CAD, CVA,  PVD   HISTORY:  Past Medical History:  Past Medical History:  Diagnosis Date   Alcohol-induced chronic pancreatitis ([place])    Alcoholic cirrhosis ([place])    B12 deficiency    Concussion    DDD (degenerative disc disease), cervical    Diabetes ([place])    DKA, type 2 ([place])    Gastric outlet obstruction 08/12/2018   Hypertension    Neuropathy    Pancreatic pseudocyst/cyst 05/06/2013   Seasonal allergies    Past Surgical History:  Past Surgical History:  Procedure Laterality Date   ANKLE ARTHROSCOPY Right 12/20/2020   Procedure: RIGHT ANKLE ARTHROSCOPIC DEBRIDEMENT;  Surgeon: Newt Minion, MD;  Location: [place];  Service: Orthopedics;  Laterality: Right;   BILIARY BRUSHING  01/27/2022   Procedure: BILIARY BRUSHING;  Surgeon: Rush Landmark Telford Nab., MD;  Location[facility];  Service: Gastroenterology;;   BIOPSY  01/19/2019   Procedure: BIOPSY;  Surgeon: Irving Copas., MD;  Location: [place];  Service: Gastroenterology;;   CHOLECYSTECTOMY N/A 07/28/2016   Procedure: LAPAROSCOPIC CHOLECYSTECTOMY WITH INTRAOPERATIVE CHOLANGIOGRAM;  Surgeon: Mickeal Skinner, MD;  Location: [place];  Service: General;  Laterality: N/A;   ERCP N/A 01/27/2022   Procedure: ENDOSCOPIC RETROGRADE CHOLANGIOPANCREATOGRAPHY (ERCP);  Surgeon: Irving Copas., MD;  [facility];  Service: Gastroenterology;  Laterality: N/A;   ESOPHAGOGASTRODUODENOSCOPY (EGD) WITH PROPOFOL N/A 08/13/2018   Procedure: ESOPHAGOGASTRODUODENOSCOPY (EGD) WITH PROPOFOL;  Surgeon: Rush Landmark Telford Nab., MD;  Location: [place];  Service: Gastroenterology;  Laterality: N/A;   ESOPHAGOGASTRODUODENOSCOPY (EGD) WITH PROPOFOL N/A 01/19/2019   Procedure: ESOPHAGOGASTRODUODENOSCOPY (EGD) WITH PROPOFOL;  Surgeon: Rush Landmark Telford Nab., MD;  Location: [place];  Service: Gastroenterology;  Laterality: N/A;   ESOPHAGOGASTRODUODENOSCOPY (EGD) WITH PROPOFOL N/A 12/28/2021   Procedure:  ESOPHAGOGASTRODUODENOSCOPY (EGD) WITH PROPOFOL;  Surgeon: Rush Landmark Telford Nab., MD;  [facility];  Service: Gastroenterology;  Laterality: N/A;   EUS N/A 12/28/2021   Procedure: UPPER ENDOSCOPIC ULTRASOUND (EUS) LINEAR;  Surgeon: Irving Copas., MD;  [facility];  Service: Gastroenterology;  Laterality: N/A;   INCISION AND DRAINAGE ABSCESS N/A 01/25/2022   Procedure: INCISION AND DRAINAGE BACK ABSCESS;  Surgeon: Kieth Brightly, Arta Bruce, MD;  [facility];  Service: General;  Laterality: N/A;  90 L DOW   IR PARACENTESIS  08/08/2020   IR PARACENTESIS  03/13/2021   IR PARACENTESIS  02/23/2022   IR US GUIDE BX ASP/DRAIN  01/23/2022   LEEP  1990's   ORIF FOOT FRACTURE  09/2008   L 5th metatarsal   PANCREATIC STENT PLACEMENT  01/27/2022   Procedure: PANCREATIC STENT PLACEMENT;  Surgeon: Irving Copas., MD;  Location[facility];  Service: Gastroenterology;;   REFRACTIVE SURGERY  2000   REMOVAL OF STONES  01/27/2022   Procedure: REMOVAL OF STONES;  Surgeon: Irving Copas., MD;  [facility];  Service: Gastroenterology;;   Joan Mayans  01/27/2022   Procedure: Joan Mayans;  Surgeon: Irving Copas., MD;  Location[facility];  Service: Gastroenterology;;   TIBIA IM NAIL INSERTION Left 03/12/2021   Procedure: INTRAMEDULLARY (IM) NAIL TIBIAL;  Surgeon: Jessy Oto, MD;  Location: [place];  Service: Orthopedics;  Laterality: Left;   TONSILLECTOMY     UPPER ESOPHAGEAL ENDOSCOPIC ULTRASOUND (EUS) N/A 08/13/2018   Procedure: UPPER ESOPHAGEAL ENDOSCOPIC ULTRASOUND (EUS);  Surgeon: Irving Copas., MD;  Location: [place];  Service: Gastroenterology;  Laterality: N/A;   UPPER ESOPHAGEAL ENDOSCOPIC ULTRASOUND (EUS) N/A 01/19/2019   Procedure: UPPER ESOPHAGEAL ENDOSCOPIC ULTRASOUND (EUS);  Surgeon: Irving Copas., MD;  Location: [place];  Service: Gastroenterology;  Laterality: N/A;   Social History:  reports that  she has never smoked. She has never used smokeless tobacco. She reports that she does not currently use alcohol after a past usage of about 7.0 standard drinks of alcohol per week. She reports that she does not use drugs. Family History:  Family History  Problem Relation Age of Onset   Other Mother        tachycardia.Marland KitchenMarland Kitchen?afib   Stroke Father        after hernia suegery   Prostate cancer Father    Other Father        global transient amnesia, unclear source   Atrial fibrillation Sister    Healthy Brother    Healthy Brother    Coronary artery disease Paternal Grandmother    Heart attack Paternal Grandmother 71   Brain cancer Maternal Grandfather        ?   Cancer Paternal Grandfather        ?   Breast cancer Maternal Grandmother      HOME MEDICATIONS: Allergies as of 04/06/2022       Reactions   Ceftriaxone Hives   Immediate bright red itchy rash on bilateral legs after starting infusion.     Gluten Meal Other (See Comments)   Stomach distress   Zocor [simvastatin] Other (See Comments)   Elevated lfts?        Medication List  Accurate as of April 06, 2022 10:22 AM. If you have any questions, ask your nurse or doctor.          ALPRAZolam 0.25 MG tablet Commonly known as: XANAX Take 1 tablet (0.25 mg total) by mouth 2 (two) times daily as needed for anxiety. Please ignore other prescription as change made.   amoxicillin-clavulanate 875-125 MG tablet Commonly known as: AUGMENTIN Take 1 tablet by mouth 2 (two) times daily.   Basaglar KwikPen 100 UNIT/ML Inject 30 Units into the skin daily.   CALCIUM+D3 PO Take 1 tablet by mouth daily.   cetirizine 10 MG tablet Commonly known as: ZYRTEC Take 10 mg by mouth at bedtime.   cholecalciferol 25 MCG (1000 UNIT) tablet Commonly known as: VITAMIN D3 Take 1,000 Units by mouth daily.   cyanocobalamin 2000 MCG tablet Take 2,000 mcg by mouth daily.   furosemide 20 MG tablet Commonly known as: Lasix Take 1  tablet (20 mg total) by mouth daily.   HumaLOG KwikPen 100 UNIT/ML KwikPen Generic drug: insulin lispro Inject 2-10 units into the skin before meals as needed for elevated BGL, PER SLIDING SCALE   Insulin Pen Needle 32G X 4 MM Misc 1 Device by Does not apply route in the morning, at noon, in the evening, and at bedtime.   meclizine 25 MG tablet Commonly known as: ANTIVERT Take 25 mg by mouth daily as needed for dizziness.   Melatonin 10 MG Tabs Take 20 mg by mouth at bedtime.   ONE TOUCH ULTRA 2 w/Device Kit Use to check blood sugars two times day   OneTouch Ultra test strip Generic drug: glucose blood CHECK BLOOD SUGAR TWICE DAILY AS DIRECTED   onetouch ultrasoft lancets Use to check blood sugar two times a day.   spironolactone 50 MG tablet Commonly known as: Aldactone Take 1 tablet (50 mg total) by mouth daily.   traZODone 100 MG tablet Commonly known as: DESYREL Take 0.5-1 tablets (50-100 mg total) by mouth at bedtime.   vancomycin 125 MG capsule Commonly known as: VANCOCIN Take 1 capsule (125 mg total) by mouth daily.           OBJECTIVE:   Vital Signs: BP 122/78 (BP Location: Left Arm, Patient Position: Sitting, Cuff Size: Normal)   Pulse (!) 107   Ht 5' 8.25" (1.734 m)   Wt 142 lb 3.2 oz (64.5 kg)   SpO2 97%   BMI 21.46 kg/m    Exam: General: Pt appears well and is in NAD  Neck: General: Supple without adenopathy. Thyroid: Thyroid size normal.  No goiter or nodules appreciated.   Lungs: Clear with good BS bilat   Heart: RRR   Abdomen: Normoactive bowel sounds, soft, nontender, without masses or organomegaly palpable  Extremities: No pretibial edema.   Neuro: MS is good with appropriate affect, pt is alert and Ox3   DM Foot Exam 11/20/2021  The skin of the feet is intact without sores or ulcerations. The pedal pulses are 2+ on right and 2+ on left. The sensation is absent  to a screening 5.07, 10 gram monofilament bilaterally    DATA  REVIEWED:  Lab Results  Component Value Date   HGBA1C 7.4 (A) 04/06/2022   HGBA1C 6.0 (A) 01/17/2022   HGBA1C 12.1 (A) 11/20/2021    Latest Reference Range & Units 03/23/22 08:40  Sodium 135 - 145 mEq/L 135  Potassium 3.5 - 5.1 mEq/L 4.6  Chloride 96 - 112 mEq/L 100  CO2 19 - 32 mEq/L  28  Glucose 70 - 99 mg/dL 176 (H)  BUN 6 - 23 mg/dL 30 (H)  Creatinine 0.40 - 1.20 mg/dL 0.85  Calcium 8.4 - 10.5 mg/dL 9.7  GFR >60.00 mL/min 75.12    Latest Reference Range & Units 03/23/22 08:40  Total CHOL/HDL Ratio  3  Cholesterol 0 - 200 mg/dL 187  HDL Cholesterol >39.00 mg/dL 69.80  LDL (calc) 0 - 99 mg/dL 92  NonHDL  117.33  Triglycerides 0.0 - 149.0 mg/dL 126.0  VLDL 0.0 - 40.0 mg/dL 25.2      ASSESSMENT / PLAN / RECOMMENDATIONS:   1) Type 2 Diabetes Mellitus, Sub-Optimally  controlled , With Neuropathic complications - Most recent A1c of 7.4 %. Goal A1c < 7.0 %.     -We discussed CGM technology but she is not keen on this at this time, unfortunately she did not bring her meter today, we discussed importance of having glucose data so I can make the appropriate adjustments to her insulin as she tends to have glycemic excursions . She has recently self increased basal insulin due to hyperglycemia but historically she has endorsed fasting hypoglycemia. We did discuss that there are three variable in making adjustments to include basal insulin, prandial insulin and correction factor and without glucose  data I am unable to firmly make  the proper decision, but we opted to increase prandial dose as below since she reports daytime BG's in the 400's since thankgiving   - She is intolerant to Metformin - She is not a candidate for GLP-1 agonists, or DPP-4 inhibitors due to hx of pancreatitis.  -Mallory Stewart has been cost prohibitive since losing her job earlier in [place] 30 units daily - Increase Humalog  8 units 3 times daily before every meal - Continue  Correction factor : NovoLog (BG -130/30)    EDUCATION / INSTRUCTIONS: BG monitoring instructions: Patient is instructed to check her blood sugars 3 times a day before each meal Call [place] [facility] if: BG persistently < 70  I reviewed the Rule of 15 for the treatment of hypoglycemia in detail with the patient. Literature supplied.   2) Diabetic complications:  Eye: Does not have known diabetic retinopathy.  Neuro/ Feet: Does have known diabetic peripheral neuropathy. Renal: Patient does not have known baseline CKD. She is on an ACEI/ARB at present.        F/U in 3 months   Signed electronically by: Mack Guise, MD  [facility]  [address] [place], [place] [address] Phone: [phone] FAX: [phone]   CC: [name], MD [place] [address] Phone: [phone]  Fax: [phone]  Return to Endocrinology clinic as below: Future Appointments  Date Time Provider [place]  04/06/2022 11:30 AM Gatha Mayer, MD [facility]  04/11/2022  8:40 AM Owens Loffler, MD [facility]  07/25/2022  9:30 AM Ralene Bathe, MD [facility] None

## 2022-04-06 NOTE — ED Notes (Signed)
Pt. CBG 226, RN made aware.

## 2022-04-06 NOTE — ED Triage Notes (Signed)
Patient reports that she had a pseudo cyst on her pancreas that came through her left flank area. Patient states she became septic and was hospitalized for a month. Patient states she had clear CT scans, but today she noted that she had soreness, swelling of the area where the abscess was. Patient was sent to the ED for further evaluation.

## 2022-04-06 NOTE — Progress Notes (Signed)
Mallory Stewart 61 y.o. [birth year] [id number]  Assessment & Plan:   Encounter Diagnoses  Name Primary?   Abscess of flank/back Yes   Alcoholic cirrhosis of liver with ascites ([place])    Chronic alcoholic pancreatitis ([place])    Difficult intravenous access    It looks like her flank abscess has returned.  I suppose it could be ascites tracking into this area but I am concerned that it is an abscess.  I have advised her to go to the emergency department at [facility] for evaluation and treatment.  I think she will need to be admitted but needs CT scan and labs.  She has had problems with difficult IV access so IV team may be needed to start IV.  I will alert the inpatient hospital team to be aware that she will be presenting to the ED today.  CC: Mallory Sanders, MD Dr. Ellender Hose Dam Dr. Gurney Maxin Subjective:   Chief Complaint: Left flank pain and bulge  HPI Mallory Stewart is a 61 year old woman with a history of alcoholic cirrhosis with ascites, alcoholic pancreatitis and C. difficile colitis who has had a recent problem with an infected pancreatic pseudocyst and left flank/back abscess with a prolonged hospitalization with IR drainage of infected pancreatic pseudocyst on 01/23/2022, and incision and drainage by Dr. Kieth Brightly on 01/25/2022.  She had a prolonged course of antibiotics and has also been followed by Dr. Sylvan Cheese of ID.  She was also status post ERCP with biliary and pancreatic sphincterotomy on 01/27/2022.  After many struggles with obtaining an outpatient CT scan in the past few weeks (difficult IV access) we were successful at obtaining a CT scan on 03/27/2022 which showed resolution of her infected pseudocyst and abscess.  She still had some abdominal ascites but overall had felt very well.  She called a couple of days ago stating that she had had a fluid sac peer on her left flank or back area again similar to what she had had before.  No fevers.  While waiting to see  me for a couple of days she says this has been enlarging and pain has worsened.  CT abdomen pelvis with contrast 03/27/2022-images reviewed with the patient.  In the flank and back area there is a little bit of thickening of the tissues, but no suggestion of fluid collection or abscess on the scan  IMPRESSION: 1. The complex rim enhancing fluid collections seen in the left retroperitoneal space extending into the left flank region have essentially resolved in the interval. There is no residual rim enhancing fluid collection at this time although fat planes in the abdominal wall of the left flank region remain obscured with some edema or granulation in the anterior pararenal space. 2. Nodular liver contour with heterogeneous enhancement. Imaging features compatible with cirrhosis. No discrete or focal mass lesion evident. Subtle paraesophageal varices suggest portal venous hypertension. 3. Moderate to large volume ascites in the abdomen and pelvis, similar to prior. 4. Stable 1.6 cm cystic lesion in the region of the pancreatic tail. 5.  Aortic Atherosclerosis (ICD10-I70.0).    She saw Dr. Kelton Pillar of endocrinology today.  She has been experiencing hyperglycemia.  Lab Results  Component Value Date   HGBA1C 7.4 (A) 04/06/2022    Allergies  Allergen Reactions   Ceftriaxone Hives    Immediate bright red itchy rash on bilateral legs after starting infusion.     Gluten Meal Other (See Comments)    Stomach distress  Zocor [Simvastatin] Other (See Comments)    Elevated lfts?   Current Meds  Medication Sig   ALPRAZolam (XANAX) 0.25 MG tablet Take 1 tablet (0.25 mg total) by mouth 2 (two) times daily as needed for anxiety. Please ignore other prescription as change made.   Blood Glucose Monitoring Suppl (ONE TOUCH ULTRA 2) w/Device KIT Use to check blood sugars two times day   Calcium Carb-Cholecalciferol (CALCIUM+D3 PO) Take 1 tablet by mouth daily.   cetirizine (ZYRTEC) 10 MG  tablet Take 10 mg by mouth at bedtime.   cholecalciferol (VITAMIN D3) 25 MCG (1000 UNIT) tablet Take 1,000 Units by mouth daily.   cyanocobalamin 2000 MCG tablet Take 2,000 mcg by mouth daily.   furosemide (LASIX) 20 MG tablet Take 1 tablet (20 mg total) by mouth daily.   glucose blood (ONETOUCH ULTRA) test strip CHECK BLOOD SUGAR TWICE DAILY AS DIRECTED   Insulin Glargine (BASAGLAR KWIKPEN) 100 UNIT/ML Inject 30 Units into the skin daily.   insulin lispro (HUMALOG KWIKPEN) 100 UNIT/ML KwikPen Max daily 30 units   Insulin Pen Needle 32G X 4 MM MISC 1 Device by Does not apply route in the morning, at noon, in the evening, and at bedtime.   Lancets (ONETOUCH ULTRASOFT) lancets Use to check blood sugar two times a day.   meclizine (ANTIVERT) 25 MG tablet Take 25 mg by mouth daily as needed for dizziness.   Melatonin 10 MG TABS Take 20 mg by mouth at bedtime.   spironolactone (ALDACTONE) 50 MG tablet Take 1 tablet (50 mg total) by mouth daily.   traZODone (DESYREL) 100 MG tablet Take 0.5-1 tablets (50-100 mg total) by mouth at bedtime.   Past Medical History:  Diagnosis Date   Alcohol-induced chronic pancreatitis (HCC)    Alcoholic cirrhosis (HCC)    B12 deficiency    Back abscess    C. difficile colitis    Concussion    DDD (degenerative disc disease), cervical    Diabetes ([place])    DKA, type 2 ([place])    Gastric outlet obstruction 08/12/2018   Hypertension    Neuropathy    Pancreatic pseudocyst/cyst 05/06/2013   Seasonal allergies    Past Surgical History:  Procedure Laterality Date   ANKLE ARTHROSCOPY Right 12/20/2020   Procedure: RIGHT ANKLE ARTHROSCOPIC DEBRIDEMENT;  Surgeon: Newt Minion, MD;  Location: [facility];  Service: Orthopedics;  Laterality: Right;   BILIARY BRUSHING  01/27/2022   Procedure: BILIARY BRUSHING;  Surgeon: Rush Landmark Telford Nab., MD;  Location: [facility];  Service: Gastroenterology;;   BIOPSY  01/19/2019   Procedure: BIOPSY;  Surgeon:  Irving Copas., MD;  Location: [facility];  Service: Gastroenterology;;   CHOLECYSTECTOMY N/A 07/28/2016   Procedure: LAPAROSCOPIC CHOLECYSTECTOMY WITH INTRAOPERATIVE CHOLANGIOGRAM;  Surgeon: Mickeal Skinner, MD;  Location: [facility];  Service: General;  Laterality: N/A;   ERCP N/A 01/27/2022   Procedure: ENDOSCOPIC RETROGRADE CHOLANGIOPANCREATOGRAPHY (ERCP);  Surgeon: Irving Copas., MD;  Location: [facility];  Service: Gastroenterology;  Laterality: N/A;   ESOPHAGOGASTRODUODENOSCOPY (EGD) WITH PROPOFOL N/A 08/13/2018   Procedure: ESOPHAGOGASTRODUODENOSCOPY (EGD) WITH PROPOFOL;  Surgeon: Rush Landmark Telford Nab., MD;  Location: [facility];  Service: Gastroenterology;  Laterality: N/A;   ESOPHAGOGASTRODUODENOSCOPY (EGD) WITH PROPOFOL N/A 01/19/2019   Procedure: ESOPHAGOGASTRODUODENOSCOPY (EGD) WITH PROPOFOL;  Surgeon: Rush Landmark Telford Nab., MD;  Location: [place];  Service: Gastroenterology;  Laterality: N/A;   ESOPHAGOGASTRODUODENOSCOPY (EGD) WITH PROPOFOL N/A 12/28/2021   Procedure: ESOPHAGOGASTRODUODENOSCOPY (EGD) WITH PROPOFOL;  Surgeon: Irving Copas., MD;  Location: [facility];  Service: Gastroenterology;  Laterality: N/A;   EUS N/A 12/28/2021   Procedure: UPPER ENDOSCOPIC ULTRASOUND (EUS) LINEAR;  Surgeon: Irving Copas., MD;  Location: [facility];  Service: Gastroenterology;  Laterality: N/A;   INCISION AND DRAINAGE ABSCESS N/A 01/25/2022   Procedure: INCISION AND DRAINAGE BACK ABSCESS;  Surgeon: Kieth Brightly, Arta Bruce, MD;  Location: [facility];  Service: General;  Laterality: N/A;  90 L DOW   IR PARACENTESIS  08/08/2020   IR PARACENTESIS  03/13/2021   IR PARACENTESIS  02/23/2022   IR US GUIDE BX ASP/DRAIN  01/23/2022   LEEP  1990's   ORIF FOOT FRACTURE  09/2008   L 5th metatarsal   PANCREATIC STENT PLACEMENT  01/27/2022   Procedure: PANCREATIC STENT PLACEMENT;  Surgeon: Irving Copas., MD;  Location: [facility];  Service:  Gastroenterology;;   REFRACTIVE SURGERY  2000   REMOVAL OF STONES  01/27/2022   Procedure: REMOVAL OF STONES;  Surgeon: Irving Copas., MD;  Location: [facility];  Service: Gastroenterology;;   Joan Mayans  01/27/2022   Procedure: Joan Mayans;  Surgeon: Irving Copas., MD;  Location: [facility];  Service: Gastroenterology;;   TIBIA IM NAIL INSERTION Left 03/12/2021   Procedure: INTRAMEDULLARY (IM) NAIL TIBIAL;  Surgeon: Jessy Oto, MD;  Location: [facility];  Service: Orthopedics;  Laterality: Left;   TONSILLECTOMY     UPPER ESOPHAGEAL ENDOSCOPIC ULTRASOUND (EUS) N/A 08/13/2018   Procedure: UPPER ESOPHAGEAL ENDOSCOPIC ULTRASOUND (EUS);  Surgeon: Irving Copas., MD;  Location: [place];  Service: Gastroenterology;  Laterality: N/A;   UPPER ESOPHAGEAL ENDOSCOPIC ULTRASOUND (EUS) N/A 01/19/2019   Procedure: UPPER ESOPHAGEAL ENDOSCOPIC ULTRASOUND (EUS);  Surgeon: Irving Copas., MD;  Location: [place];  Service: Gastroenterology;  Laterality: N/A;   Social History   Social History Narrative   Patient is married one stepson    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 not traveling   regular exercise , walking 4-5 times a week   Diet fruits and veggies, water   Alcohol at least several glasses of wine a week Stopped 08/2018   Never smoker, no drug use   Right handed   One story home   [place] in the am   family history includes Atrial fibrillation in her sister; Brain cancer in her maternal grandfather; Breast cancer in her maternal grandmother; Cancer in her paternal grandfather; Coronary artery disease in her paternal grandmother; Healthy in her brother and brother; Heart attack (age of onset: 61) in her paternal grandmother; Other in her father and mother; Prostate cancer in her father; Stroke in her father.   Review of Systems See HPI  Objective:   Physical Exam BP 122/82    Pulse 100   Ht _0  (1.727 m)   Wt 142 lb (64.4 kg)   BMI 21.59 kg/m  Thin white woman no acute distress On the left flank there is a tender fluctuant lesion, there is some erythema (slight) and some mottling of this area and it is quite tender.  It extends around towards the left lateral abdomen, and halfway to the midline of the back I think.  Its roughly the size of a large hand.

## 2022-04-07 ENCOUNTER — Inpatient Hospital Stay (HOSPITAL_COMMUNITY): Payer: 59

## 2022-04-07 DIAGNOSIS — E1165 Type 2 diabetes mellitus with hyperglycemia: Secondary | ICD-10-CM | POA: Diagnosis not present

## 2022-04-07 DIAGNOSIS — E871 Hypo-osmolality and hyponatremia: Secondary | ICD-10-CM

## 2022-04-07 DIAGNOSIS — K7031 Alcoholic cirrhosis of liver with ascites: Secondary | ICD-10-CM | POA: Diagnosis not present

## 2022-04-07 DIAGNOSIS — K651 Peritoneal abscess: Secondary | ICD-10-CM

## 2022-04-07 DIAGNOSIS — L02211 Cutaneous abscess of abdominal wall: Secondary | ICD-10-CM | POA: Diagnosis not present

## 2022-04-07 LAB — CBC
HCT: 35.9 % — ABNORMAL LOW (ref 36.0–46.0)
Hemoglobin: 12.1 g/dL (ref 12.0–15.0)
MCH: 28.3 pg (ref 26.0–34.0)
MCHC: 33.7 g/dL (ref 30.0–36.0)
MCV: 84.1 fL (ref 80.0–100.0)
Platelets: 190 10*3/uL (ref 150–400)
RBC: 4.27 MIL/uL (ref 3.87–5.11)
RDW: 13.5 % (ref 11.5–15.5)
WBC: 7.7 10*3/uL (ref 4.0–10.5)
nRBC: 0 % (ref 0.0–0.2)

## 2022-04-07 LAB — BASIC METABOLIC PANEL
Anion gap: 9 (ref 5–15)
BUN: 35 mg/dL — ABNORMAL HIGH (ref 6–20)
CO2: 24 mmol/L (ref 22–32)
Calcium: 9.2 mg/dL (ref 8.9–10.3)
Chloride: 97 mmol/L — ABNORMAL LOW (ref 98–111)
Creatinine, Ser: 0.92 mg/dL (ref 0.44–1.00)
GFR, Estimated: >60 mL/min (ref >60)
Glucose, Bld: 243 mg/dL — ABNORMAL HIGH (ref 70–99)
Potassium: 4.2 mmol/L (ref 3.5–5.1)
Sodium: 130 mmol/L — ABNORMAL LOW (ref 135–145)

## 2022-04-07 LAB — CBG MONITORING, ED
Glucose-Capillary: 185 mg/dL — ABNORMAL HIGH (ref 70–99)
Glucose-Capillary: 129 mg/dL — ABNORMAL HIGH (ref 70–99)

## 2022-04-07 LAB — GLUCOSE, CAPILLARY
Glucose-Capillary: 106 mg/dL — ABNORMAL HIGH (ref 70–99)
Glucose-Capillary: 227 mg/dL — ABNORMAL HIGH (ref 70–99)

## 2022-04-07 MED ORDER — FENTANYL CITRATE (PF) 100 MCG/2ML IJ SOLN
INTRAMUSCULAR | Status: AC
  Filled 2022-04-07: qty 4

## 2022-04-07 MED ORDER — INSULIN ASPART 100 UNIT/ML IJ SOLN
0.0000 [IU] | Freq: Three times a day (TID) | INTRAMUSCULAR | Status: DC
  Administered 2022-04-07: 3 [IU] via SUBCUTANEOUS
  Administered 2022-04-07: 4 [IU] via SUBCUTANEOUS
  Administered 2022-04-07: 7 [IU] via SUBCUTANEOUS
  Administered 2022-04-08 (×2): 4 [IU] via SUBCUTANEOUS
  Administered 2022-04-08: 7 [IU] via SUBCUTANEOUS
  Administered 2022-04-08: 11 [IU] via SUBCUTANEOUS
  Administered 2022-04-09: 4 [IU] via SUBCUTANEOUS
  Administered 2022-04-09: 2 [IU] via SUBCUTANEOUS
  Administered 2022-04-09 (×2): 7 [IU] via SUBCUTANEOUS
  Administered 2022-04-10: 4 [IU] via SUBCUTANEOUS
  Filled 2022-04-07: qty 0.2

## 2022-04-07 MED ORDER — FUROSEMIDE 20 MG PO TABS
20.0000 mg | ORAL_TABLET | Freq: Every day | ORAL | Status: DC
  Administered 2022-04-07 – 2022-04-10 (×4): 20 mg via ORAL
  Filled 2022-04-07 (×4): qty 1

## 2022-04-07 MED ORDER — SODIUM CHLORIDE 0.9% FLUSH
5.0000 mL | Freq: Three times a day (TID) | INTRAVENOUS | Status: DC
  Administered 2022-04-07 – 2022-04-09 (×6): 5 mL

## 2022-04-07 MED ORDER — SPIRONOLACTONE 25 MG PO TABS
50.0000 mg | ORAL_TABLET | Freq: Every day | ORAL | Status: DC
  Administered 2022-04-07 – 2022-04-10 (×4): 50 mg via ORAL
  Filled 2022-04-07 (×4): qty 2

## 2022-04-07 MED ORDER — LIDOCAINE HCL (PF) 1 % IJ SOLN
INTRAMUSCULAR | Status: AC | PRN
  Administered 2022-04-07: 10 mL

## 2022-04-07 MED ORDER — MIDAZOLAM HCL 2 MG/2ML IJ SOLN
INTRAMUSCULAR | Status: AC | PRN
  Administered 2022-04-07 (×2): .5 mg via INTRAVENOUS
  Administered 2022-04-07: 1 mg via INTRAVENOUS

## 2022-04-07 MED ORDER — FENTANYL CITRATE (PF) 100 MCG/2ML IJ SOLN
INTRAMUSCULAR | Status: AC | PRN
  Administered 2022-04-07: 50 ug via INTRAVENOUS
  Administered 2022-04-07 (×2): 25 ug via INTRAVENOUS

## 2022-04-07 MED ORDER — VITAMIN B-12 1000 MCG PO TABS
5000.0000 ug | ORAL_TABLET | Freq: Every day | ORAL | Status: DC
  Administered 2022-04-07 – 2022-04-10 (×4): 5000 ug via ORAL
  Filled 2022-04-07 (×4): qty 5

## 2022-04-07 MED ORDER — VITAMIN B-12 100 MCG PO TABS
5000.0000 ug | ORAL_TABLET | Freq: Every day | ORAL | Status: DC
  Filled 2022-04-07: qty 50

## 2022-04-07 MED ORDER — MIDAZOLAM HCL 2 MG/2ML IJ SOLN
INTRAMUSCULAR | Status: AC
  Filled 2022-04-07: qty 4

## 2022-04-07 MED ORDER — ALPRAZOLAM 0.25 MG PO TABS
0.2500 mg | ORAL_TABLET | Freq: Two times a day (BID) | ORAL | Status: DC | PRN
  Administered 2022-04-07 – 2022-04-08 (×2): 0.25 mg via ORAL
  Filled 2022-04-07 (×2): qty 1

## 2022-04-07 MED ORDER — LORATADINE 10 MG PO TABS
10.0000 mg | ORAL_TABLET | Freq: Every day | ORAL | Status: DC
  Administered 2022-04-07 – 2022-04-10 (×4): 10 mg via ORAL
  Filled 2022-04-07 (×4): qty 1

## 2022-04-07 MED ORDER — TRAZODONE HCL 100 MG PO TABS
100.0000 mg | ORAL_TABLET | Freq: Every day | ORAL | Status: DC
  Administered 2022-04-07 – 2022-04-09 (×4): 100 mg via ORAL
  Filled 2022-04-07 (×4): qty 1

## 2022-04-07 MED ORDER — LIDOCAINE HCL 1 % IJ SOLN
INTRAMUSCULAR | Status: AC
  Filled 2022-04-07: qty 20

## 2022-04-07 NOTE — Procedures (Signed)
Interventional Radiology Procedure Note  Date of Procedure: 04/07/2022  Procedure: US guided drain placement   Findings:  1. US guided drain placement into retroperitoneal abscess, 12 Fr locking, placed to bulb. Sample sent for micro.    Complications: No immediate complications noted.   Estimated Blood Loss: minimal  Follow-up and Recommendations: 1. Flush drian x3 daily    Olive Bass, MD  Vascular & Interventional Radiology  04/07/2022 5:21 PM

## 2022-04-07 NOTE — Consult Note (Signed)
Chief Complaint: Patient was seen in consultation today for left flank fluid collectionaspiration/possible drain placement.  Referring Physician(s): Coralie Keens, MD  Supervising Physician: Juliet Rude  Patient Status: [facility]  History of Present Illness: Mallory [facility] is a 61 y.o. female with a past medical history significant for DDD, DM, HTN, portal HTN, gastric variceds, pancreatic pseudocyst, ETOH cirrhosis with ascites, ETOH induced chronic pancreatitis who presented to the [facility] on 12/1 with recurrent flank abscess. Ms. Hosek was hospitalized in August of this year for infected pancreatic pseudocyst as well as cellulitis of the left flank with a large abscess in the left posterior pararenal space with extension into the posterior left abdominal wall. She underwent IR aspiration of the left posterior abdominal wall collection followed by I&D on 01/05/22 with general surgery. Culture showed Klebsiella pneumoniae. She was treated with IV Unasyn and then discharged on PO Augmentin and vancomycin.   She was again hospitalized 01/22/22 - 01/29/22 with sepsis secondary to cellulitis and large flank abscess. She underwent I&D of the left flank abscess with drain placement by surgery on 9/21 and ERCP with GI on 9/23 to rule out pancreatic duct leak and was found to have a possible distal pancreatic duct stricture for which she underwent biliary and pancreatic sphincterotomy with pancreatic duct stent placement. The drain was removed by surgery in October and on follow up CT 11/20 there was significant improvement in the fluid collections with moderate ascites. About 2 days ago the patient reported worsening redness and swelling over her left flank and presented to the [facility] for further evaluation. CT abd/pelvis w/contrast in the [facility] showed:  1. There is a large peripherally enhancing complex fluid collection in the left abdomen extending from the tail of the pancreas posteriorly and  inferiorly into the subcutaneous fat. The collection is significantly larger compared to March 26, 2022. However, the collection is a little smaller than the January 22, 2022 study. The collection extends to the skin surface. This collection may represent a pseudocyst given previous history. An abscess could have a similar appearance. Whether the collection is sterile or infected cannot be determined on this study. There is no obvious air in the collection. 2. Cirrhosis with evidence of portal venous hypertension including splenic varices and moderate ascites. 3. Moderate fecal loading in the colon. 4. Calcified atherosclerotic changes in the abdominal aorta, iliac vessels, and femoral vessels. 5. Aortic atherosclerosis.  She was seen by general surgery and IR has been consulted for aspiration/possible drain placement.  Past Medical History:  Diagnosis Date   Alcohol-induced chronic pancreatitis (HCC)    Alcoholic cirrhosis (HCC)    B12 deficiency    Back abscess    C. difficile colitis    Concussion    DDD (degenerative disc disease), cervical    Diabetes ([place])    DKA, type 2 ([place])    Gastric outlet obstruction 08/12/2018   Hypertension    Neuropathy    Pancreatic pseudocyst/cyst 05/06/2013   Seasonal allergies     Past Surgical History:  Procedure Laterality Date   ANKLE ARTHROSCOPY Right 12/20/2020   Procedure: RIGHT ANKLE ARTHROSCOPIC DEBRIDEMENT;  Surgeon: Newt Minion, MD;  Location: [place];  Service: Orthopedics;  Laterality: Right;   BILIARY BRUSHING  01/27/2022   Procedure: BILIARY BRUSHING;  Surgeon: Rush Landmark Telford Nab., MD;  Location: [facility];  Service: Gastroenterology;;   BIOPSY  01/19/2019   Procedure: BIOPSY;  Surgeon: Irving Copas., MD;  Location: [place];  Service: Gastroenterology;;   CHOLECYSTECTOMY N/A 07/28/2016   Procedure: LAPAROSCOPIC CHOLECYSTECTOMY WITH INTRAOPERATIVE CHOLANGIOGRAM;  Surgeon: Mickeal Skinner, MD;  Location: [place];  Service: General;  Laterality: N/A;   ERCP N/A 01/27/2022   Procedure: ENDOSCOPIC RETROGRADE CHOLANGIOPANCREATOGRAPHY (ERCP);  Surgeon: Irving Copas., MD;  Location: [facility];  Service: Gastroenterology;  Laterality: N/A;   ESOPHAGOGASTRODUODENOSCOPY (EGD) WITH PROPOFOL N/A 08/13/2018   Procedure: ESOPHAGOGASTRODUODENOSCOPY (EGD) WITH PROPOFOL;  Surgeon: Rush Landmark Telford Nab., MD;  Location: [place];  Service: Gastroenterology;  Laterality: N/A;   ESOPHAGOGASTRODUODENOSCOPY (EGD) WITH PROPOFOL N/A 01/19/2019   Procedure: ESOPHAGOGASTRODUODENOSCOPY (EGD) WITH PROPOFOL;  Surgeon: Rush Landmark Telford Nab., MD;  Location: [place];  Service: Gastroenterology;  Laterality: N/A;   ESOPHAGOGASTRODUODENOSCOPY (EGD) WITH PROPOFOL N/A 12/28/2021   Procedure: ESOPHAGOGASTRODUODENOSCOPY (EGD) WITH PROPOFOL;  Surgeon: Rush Landmark Telford Nab., MD;  Location: [facility];  Service: Gastroenterology;  Laterality: N/A;   EUS N/A 12/28/2021   Procedure: UPPER ENDOSCOPIC ULTRASOUND (EUS) LINEAR;  Surgeon: Irving Copas., MD;  Location: [facility];  Service: Gastroenterology;  Laterality: N/A;   INCISION AND DRAINAGE ABSCESS N/A 01/25/2022   Procedure: INCISION AND DRAINAGE BACK ABSCESS;  Surgeon: Kieth Brightly, Arta Bruce, MD;  Location: [facility];  Service: General;  Laterality: N/A;  90 L DOW   IR PARACENTESIS  08/08/2020   IR PARACENTESIS  03/13/2021   IR PARACENTESIS  02/23/2022   IR US GUIDE BX ASP/DRAIN  01/23/2022   LEEP  1990's   ORIF FOOT FRACTURE  09/2008   L 5th metatarsal   PANCREATIC STENT PLACEMENT  01/27/2022   Procedure: PANCREATIC STENT PLACEMENT;  Surgeon: Irving Copas., MD;  Location: [facility];  Service: Gastroenterology;;   REFRACTIVE SURGERY  2000   REMOVAL OF STONES  01/27/2022   Procedure: REMOVAL OF STONES;  Surgeon: Irving Copas., MD;  Location: [facility];  Service: Gastroenterology;;   Joan Mayans   01/27/2022   Procedure: Joan Mayans;  Surgeon: Irving Copas., MD;  Location: [facility];  Service: Gastroenterology;;   TIBIA IM NAIL INSERTION Left 03/12/2021   Procedure: INTRAMEDULLARY (IM) NAIL TIBIAL;  Surgeon: Jessy Oto, MD;  Location: [place];  Service: Orthopedics;  Laterality: Left;   TONSILLECTOMY     UPPER ESOPHAGEAL ENDOSCOPIC ULTRASOUND (EUS) N/A 08/13/2018   Procedure: UPPER ESOPHAGEAL ENDOSCOPIC ULTRASOUND (EUS);  Surgeon: Irving Copas., MD;  Location: [place];  Service: Gastroenterology;  Laterality: N/A;   UPPER ESOPHAGEAL ENDOSCOPIC ULTRASOUND (EUS) N/A 01/19/2019   Procedure: UPPER ESOPHAGEAL ENDOSCOPIC ULTRASOUND (EUS);  Surgeon: Irving Copas., MD;  Location: [place];  Service: Gastroenterology;  Laterality: N/A;    Allergies: Ceftriaxone, Gluten meal, and Zocor [simvastatin]  Medications: Prior to Admission medications   Medication Sig Start Date End Date Taking? Authorizing Provider  ALPRAZolam (XANAX) 0.25 MG tablet Take 1 tablet (0.25 mg total) by mouth 2 (two) times daily as needed for anxiety. Please ignore other prescription as change made. Patient taking differently: Take 0.25 mg by mouth every 12 (twelve) hours as needed for anxiety. 04/05/22  Yes Bedsole, Amy E, MD  CALCIUM PO Take 1 tablet by mouth in the morning and at bedtime.   Yes [provider]  cetirizine (ZYRTEC) 10 MG tablet Take 10 mg by mouth at bedtime.   Yes [provider]  Cholecalciferol (VITAMIN D3 PO) Take 2 capsules by mouth in the morning and at bedtime.   Yes [provider]  Cyanocobalamin (VITAMIN B-12) 5000 MCG SUBL Place 5,000 mcg under the tongue daily.  Yes [provider]  furosemide (LASIX) 20 MG tablet Take 1 tablet (20 mg total) by mouth daily. Patient taking differently: Take 20 mg by mouth in the morning. 03/15/22  Yes Gatha Mayer, MD  Insulin Glargine [facility] KWIKPEN) 100 UNIT/ML Inject 30  Units into the skin daily. 04/06/22  Yes Shamleffer, Melanie Crazier, MD  insulin lispro (HUMALOG KWIKPEN) 100 UNIT/ML KwikPen Max daily 30 units Patient taking differently: 6 Units See admin instructions. Inject 6 units into the skin three times a day with meals, PER SLIDING SCALE 04/06/22  Yes Shamleffer, Melanie Crazier, MD  spironolactone (ALDACTONE) 50 MG tablet Take 1 tablet (50 mg total) by mouth daily. 03/15/22  Yes Gatha Mayer, MD  traZODone (DESYREL) 100 MG tablet Take 0.5-1 tablets (50-100 mg total) by mouth at bedtime. Patient taking differently: Take 100 mg by mouth at bedtime. 03/26/22  Yes Copland, Frederico Hamman, MD  amoxicillin-clavulanate (AUGMENTIN) 875-125 MG tablet Take 1 tablet by mouth 2 (two) times daily. Patient not taking: Reported on 04/06/2022 02/22/22   Tommy Medal, Lavell Islam, MD  Blood Glucose Monitoring Suppl (ONE TOUCH ULTRA 2) w/Device KIT Use to check blood sugars two times day 08/01/18   Bedsole, Amy E, MD  glucose blood (ONETOUCH ULTRA) test strip CHECK BLOOD SUGAR TWICE DAILY AS DIRECTED 12/13/21   Bedsole, Amy E, MD  Insulin Pen Needle 32G X 4 MM MISC 1 Device by Does not apply route in the morning, at noon, in the evening, and at bedtime. 04/06/22   Shamleffer, Melanie Crazier, MD  Lancets [facility] ULTRASOFT) lancets Use to check blood sugar two times a day. 08/01/18   Bedsole, Amy E, MD  vancomycin (VANCOCIN) 125 MG capsule Take 1 capsule (125 mg total) by mouth daily. Patient not taking: Reported on 04/06/2022 02/22/22   Tommy Medal, Lavell Islam, MD     Family History  Problem Relation Age of Onset   Other Mother        tachycardia.Marland KitchenMarland Kitchen?afib   Stroke Father        after hernia suegery   Prostate cancer Father    Other Father        global transient amnesia, unclear source   Atrial fibrillation Sister    Healthy Brother    Healthy Brother    Coronary artery disease Paternal Grandmother    Heart attack Paternal Grandmother 30   Brain cancer Maternal Grandfather         ?   Cancer Paternal Grandfather        ?   Breast cancer Maternal Grandmother     Social History   Socioeconomic History   Marital status: Married    Spouse name: Not on file   Number of children: 1   Years of education: 16   Highest education level: Not on file  Occupational History   Occupation: Mudlogger of business development  Tobacco Use   Smoking status: Never   Smokeless tobacco: Never  Vaping Use   Vaping Use: Never used  Substance and Sexual Activity   Alcohol use: Not Currently    Alcohol/week: 7.0 standard drinks of alcohol    Types: 7 Glasses of wine per week    Comment: wine   Drug use: No   Sexual activity: Not on file  Other Topics Concern   Not on file  Social History Narrative   Patient is married one stepson    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 mostly  when she is not traveling   regular exercise , walking 4-5 times a week   Diet fruits and veggies, water   Alcohol at least several glasses of wine a week Stopped 08/2018   Never smoker, no drug use   Right handed   One story home   [place] in the am   Social Determinants of Health   Financial Resource Strain: Not on file  Food Insecurity: No Food Insecurity (01/22/2022)   Hunger Vital Sign    Worried About Running Out of Food in the Last Year: Never true    Ran Out of Food in the Last Year: Never true  Transportation Needs: No Transportation Needs (01/22/2022)   PRAPARE - Hydrologist (Medical): No    Lack of Transportation (Non-Medical): No  Physical Activity: Not on file  Stress: Not on file  Social Connections: Not on file     Review of Systems: A 12 point ROS discussed and pertinent positives are indicated in the HPI above.  All other systems are negative.  Review of Systems  Constitutional:  Negative for chills and fever.  Respiratory:  Negative for cough and shortness of breath.   Cardiovascular:  Negative  for chest pain.  Gastrointestinal:  Positive for abdominal distention. Negative for abdominal pain, nausea and vomiting.  Genitourinary:  Positive for flank pain.  Musculoskeletal:  Positive for back pain (left flank).  Neurological:  Positive for headaches (recent concussion, mild currently).    Vital Signs: BP 116/78   Pulse 98   Temp 100 F (37.8 C) (Oral)   Resp 15   Ht _0  (1.727 m)   Wt 142 lb (64.4 kg)   SpO2 95%   BMI 21.59 kg/m   Physical Exam Vitals and nursing note reviewed.  Constitutional:      General: She is not in acute distress. HENT:     Head: Normocephalic.     Mouth/Throat:     Mouth: Mucous membranes are moist.     Pharynx: Oropharynx is clear. No oropharyngeal exudate or posterior oropharyngeal erythema.  Cardiovascular:     Rate and Rhythm: Normal rate and regular rhythm.  Pulmonary:     Effort: Pulmonary effort is normal.     Breath sounds: Normal breath sounds.  Abdominal:     General: There is distension.     Palpations: Abdomen is soft.  Skin:    General: Skin is warm and dry.  Neurological:     Mental Status: She is alert and oriented to person, place, and time.  Psychiatric:        Mood and Affect: Mood normal.        Behavior: Behavior normal.        Thought Content: Thought content normal.        Judgment: Judgment normal.      MD Evaluation Airway: WNL Heart: WNL Abdomen: WNL Chest/ Lungs: WNL ASA  Classification: 2 Mallampati/Airway Score: One   Imaging: CT Abdomen Pelvis W Contrast  Result Date: 04/06/2022 CLINICAL DATA:  Recurrent abscess with communication with the pancreas. Patient states she had a pseudocyst on her pancreas that extended into the left flank with drainage. The patient became septic and was hospitalized for a month. EXAM: CT ABDOMEN AND PELVIS WITH CONTRAST TECHNIQUE: Multidetector CT imaging of the abdomen and pelvis was performed using the standard protocol following bolus administration of  intravenous contrast. RADIATION DOSE REDUCTION: This exam was performed according to the departmental dose-optimization program  which includes automated exposure control, adjustment of the mA and/or kV according to patient size and/or use of iterative reconstruction technique. CONTRAST:  142m OMNIPAQUE IOHEXOL 300 MG/ML  SOLN COMPARISON:  CT scan of the abdomen and pelvis January 22, 2022 and March 26, 2022 FINDINGS: Lower chest: No acute abnormality. Hepatobiliary: The liver demonstrates cirrhotic changes with a nodular contour. No liver mass identified. Portal vein is patent. The patient is status post cholecystectomy. Prominence of the common bile duct and central intrahepatic ducts is likely from previous cholecystectomy and is stable. Pancreas: Pancreatic head, neck, and body are stable. A few calcifications in the pancreatic head are likely due to previous pancreatitis. There is fluid near the pancreatic tail which could be in the talar adjacent. Pancreas is otherwise stable. Spleen: The spleen is prominent but stable. Adrenals/Urinary Tract: Adrenal glands are unremarkable. Kidneys are normal, without renal calculi, focal lesion, or hydronephrosis. Bladder is unremarkable. Stomach/Bowel: The stomach and small bowel are normal. Moderate fecal loading in the colon. The colon is otherwise unremarkable. The appendix is unremarkable. Vascular/Lymphatic: Calcified atherosclerotic changes are identified in the abdominal aorta, extending into the iliac and femoral vessels. No adenopathy. Splenic varices are noted. Reproductive: Uterus and bilateral adnexa are unremarkable. Other: There is moderate ascites in the abdomen adjacent to the liver and spleen. There is also significant ascites in the pelvis. The amount of ascites is similar since March 26, 2022. There is a large peripherally enhancing complex fluid collection. The collection extends from near the tail of the pancreas posteriorly and inferiorly  through the left posterior musculature, X into the subcutaneous fat. The collection extends to the skin surface. Again, the margins are irregular and the collection is difficult to measure. The collection is significantly larger compared to March 26, 2022 1 only a small amount of fluid remain near the pancreatic tail. However, the collection is a little smaller than the January 22, 2022 study. Musculoskeletal: No acute or significant osseous findings. IMPRESSION: 1. There is a large peripherally enhancing complex fluid collection in the left abdomen extending from the tail of the pancreas posteriorly and inferiorly into the subcutaneous fat. The collection is significantly larger compared to March 26, 2022. However, the collection is a little smaller than the January 22, 2022 study. The collection extends to the skin surface. This collection may represent a pseudocyst given previous history. An abscess could have a similar appearance. Whether the collection is sterile or infected cannot be determined on this study. There is no obvious air in the collection. 2. Cirrhosis with evidence of portal venous hypertension including splenic varices and moderate ascites. 3. Moderate fecal loading in the colon. 4. Calcified atherosclerotic changes in the abdominal aorta, iliac vessels, and femoral vessels. 5. Aortic atherosclerosis. Aortic Atherosclerosis (ICD10-I70.0). Electronically Signed   By: DDorise BullionIII M.D.   On: 04/06/2022 18:29   CT Abdomen Pelvis W Contrast  Result Date: 03/27/2022 CLINICAL DATA:  Pancreatic pseudocyst.  Skip down down 02/21/2022 EXAM: CT ABDOMEN AND PELVIS WITH CONTRAST TECHNIQUE: Multidetector CT imaging of the abdomen and pelvis was performed using the standard protocol following bolus administration of intravenous contrast. RADIATION DOSE REDUCTION: This exam was performed according to the departmental dose-optimization program which includes automated exposure control,  adjustment of the mA and/or kV according to patient size and/or use of iterative reconstruction technique. CONTRAST:  1068mOMNIPAQUE IOHEXOL 300 MG/ML  SOLN COMPARISON:  01/22/2022 FINDINGS: Lower chest: Unremarkable. Hepatobiliary: Nodular liver contour with heterogeneous enhancement. No discrete or focal  mass lesion evident. Gallbladder is surgically absent. Stable prominence of the extrahepatic bile ducts. Pancreas: No main duct dilatation. Cystic lesion in the region of the pancreatic tail is stable at 1.6 cm maximum dimension (32/3). Spleen: No splenomegaly. No focal mass lesion. Adrenals/Urinary Tract: No adrenal nodule or mass. Kidneys unremarkable. No evidence for hydroureter. The urinary bladder appears normal for the degree of distention. Stomach/Bowel: Stomach is distended with food, fluid, and contrast material. Duodenum is normally positioned as is the ligament of Treitz. No small bowel wall thickening. No small bowel dilatation. The terminal ileum is normal. The appendix is normal. No gross colonic mass. No colonic wall thickening. Vascular/Lymphatic: There is moderate atherosclerotic calcification of the abdominal aorta without aneurysm. There is no gastrohepatic or hepatoduodenal ligament lymphadenopathy. No retroperitoneal or mesenteric lymphadenopathy. Portal vein, superior mesenteric vein, and splenic vein are patent. Subtle paraesophageal varices evident. No pelvic sidewall lymphadenopathy. Reproductive: The uterus is unremarkable.  There is no adnexal mass. Other: Moderate to large volume ascites identified in the abdomen and pelvis, similar to prior. Musculoskeletal: The complex rim enhancing fluid collections seen in left retroperitoneal space extending into the left flank region has essentially resolved in the interval. There is no residual rim enhancing fluid collection at this time although fat planes in the abdominal wall the left flank region remain obscured with some fluid or  granulation in the anterior pararenal space. No worrisome lytic or sclerotic osseous abnormality. IMPRESSION: 1. The complex rim enhancing fluid collections seen in the left retroperitoneal space extending into the left flank region have essentially resolved in the interval. There is no residual rim enhancing fluid collection at this time although fat planes in the abdominal wall of the left flank region remain obscured with some edema or granulation in the anterior pararenal space. 2. Nodular liver contour with heterogeneous enhancement. Imaging features compatible with cirrhosis. No discrete or focal mass lesion evident. Subtle paraesophageal varices suggest portal venous hypertension. 3. Moderate to large volume ascites in the abdomen and pelvis, similar to prior. 4. Stable 1.6 cm cystic lesion in the region of the pancreatic tail. 5.  Aortic Atherosclerosis (ICD10-I70.0). Electronically Signed   By: Misty Stanley M.D.   On: 03/27/2022 10:06    Labs:  CBC: Recent Labs    01/28/22 0516 03/15/22 1110 04/06/22 1438 04/07/22 0220  WBC 6.5 5.3 6.4 7.7  HGB 9.3* 13.1 12.7 12.1  HCT 28.5* 39.5 39.0 35.9*  PLT 211 173.0 167 190    COAGS: Recent Labs    08/07/21 1645 12/13/21 1129 01/22/22 1217 01/23/22 0508 01/25/22 0509 01/27/22 0523 03/15/22 1110  INR 1.3*   < > 1.5* 1.8* 1.2 1.3* 1.1*  APTT 31  --  32  --   --   --   --    < > = values in this interval not displayed.    BMP: Recent Labs    01/28/22 0516 01/29/22 0424 02/05/22 0846 03/23/22 0840 04/06/22 1048 04/06/22 1438 04/07/22 0220  NA 133* 135   < > 135 130* 133* 130*  K 4.4 3.9   < > 4.6 3.9 3.7 4.2  CL 101 103   < > 100 94* 97* 97*  CO2 26 25   < > _0 GLUCOSE 226* 118*   < > 176* 255* 280* 243*  BUN 18 16   < > 30* 32* 35* 35*  CALCIUM 8.2* 8.3*   < > 9.7 10.1 9.6 9.2  CREATININE 1.09* 1.03*   < >  0.85 0.90 0.93 0.92  GFRNONAA 59* >60  --   --   --  >60 >60   < > = values in this interval not  displayed.    LIVER FUNCTION TESTS: Recent Labs    01/28/22 0516 01/29/22 0424 03/15/22 1110 04/06/22 1438  BILITOT 0.6 0.5 0.7 1.0  AST 200* 81* 52* 27  ALT 85* 60* 19 24  ALKPHOS 395* 317* 295* 299*  PROT 5.3* 5.8* 8.8* 8.3*  ALBUMIN 2.2* 2.4* 4.3 3.5    TUMOR MARKERS: Recent Labs    12/13/21 1129  AFPTM 2.2  CA199 15    Assessment and Plan:  61 y/o F admitted with recurrent left flank abscess seen today for possible percutaneous aspiration/drain placement with IR.  Patient history and imaging reviewed by Dr. Denna Haggard who approves procedure for today in [place] with moderate sedation. He will also assess ascites at the time of left flank abscess aspiration and if sufficient ascites will perform paracentesis. Patient has been NPO since midnight - continue NPO until post procedure.  Risks and benefits discussed with the patient including bleeding, infection, damage to adjacent structures, bowel perforation/fistula connection, and sepsis.  All of the patient's questions were answered, patient is agreeable to proceed.  Consent signed and in chart.  Thank you for this interesting consult.  I greatly enjoyed meeting ARIYAH SEDLACK and look forward to participating in their care.  A copy of this report was sent to the requesting provider on this date.  Electronically Signed: Joaquim Nam, PA-C 04/07/2022, 12:08 PM   I spent a total of 40 Minutes  in face to face in clinical consultation, greater than 50% of which was counseling/coordinating care for left flank abscess aspiration/possible drain placement/possible paracentesis.

## 2022-04-07 NOTE — Progress Notes (Signed)
PROGRESS NOTE    FE OKUBO  WUJ:[id number] DOB: [birth year] DOA: 04/06/2022 PCP: Excell Seltzer, MD   Brief Narrative:  The patient is a 61 year old Caucasian female with a past medical history significant for but not limited to hypertension, portal hypertension, history of gastric varices, chronic alcoholic pancreatitis with pseudocyst, history of alcoholic cirrhosis of the liver, diabetes mellitus type 2, hyperlipidemia, history of C. difficile colitis as well as other comorbidities who presents with recurrent flank abscess.  Of note she was hospitalized from 01/22/2022 until 01/29/2022 with sepsis secondary cellulitis and a large abscess collection.  She was also hospitalized in August for infected noted pseudocyst and was found to have cellulitis of the left leg 24 hours hemoglobin with a large abscess of the left posterior pararenal space with extension the posterior left abdominal wall.  IR was consulted and she underwent ultrasound-guided aspiration of the left posterior abdominal wall collection.  General surgery did I&D on 01/05/2022 and aerobic and anaerobic culture showed Klebsiella pneumonia.  She was treated with Unasyn and discharged on Augmentin for 4 weeks per ID.  She is prescribed oral vancomycin for C. difficile prophylaxis.  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  She had repeat CT of the abdomen pelvis on 03/29/2022 showing complete resolution of the abscess and retroperitoneal space.  However about 6 days ago she began to feel flank pain and started developing chills.  She felt her back and knew that she had an infection that returned so she went to go gastroenterology on 04/06/2022 who directed her to the ED.  In the ED she had no leukocytosis or anemia and she did have elevated glucose of  255.  CT scan of the abdomen pelvis showed large peripherally enhancing complex fluid collection extends from the tail of the pancreas posterior and evaluate through the left posterior musculature into the subcutaneous fat and skin surface.  There is moderate ascites in the abdomen adjacent to the liver and spleen.  The EDP discussed with general surgery who consulted and recommended having IR consider percutaneous drain as they felt that she may still have likely a infected pseudocyst.  There is consideration for open drainage in the operating room and given that she is in minimal distress surgery recommended trying a percutaneous drain first.  Antibiotics are being resumed and continued and IR has evaluated and they will do a possible percutaneous aspiration and drain placement today and if there is some infectious mono ascites we will also perform paracentesis given that she is n.p.o.  Assessment and Plan: * Abscess of flank -Hx of large left posterior abdominal wall abscess in September requiring IR ultrasound-guided aspiration and I&D with general surgery. Anaerobic/aerobic culture at that time positive for Klebsiella pneumoniae. -CT abdomen pelvis today showed large peripherally enhancing complex fluid collection that extends from the tail of the pancreas posteriorly and inferiorly through the left posterior musculature into the subcutaneous fat and skin surface.  There is moderate ascites in the abdomen adjacent to the liver and spleen. -Was started on IV Unasyn as she has sensitivity to it with the past retroperitoneal abscess and will continue -General surgery Dr. Rayburn Ma consulted and will see in consultation in the morning and after evaluation here recommending continue antibiotics and getting interventional radiology involved to determine whether he can place a drain  or not; she is leaning towards a percutaneous drain as this pseudocyst might may still likely be infected but she may need to go to  the operating room for open drainage but given her chronic liver disease and chronic necrotized this remains a difficult situation -IR consulted and planning on possible percutaneous aspiration and drain placement and also as well as if she has sufficient ascites we will obtain a paracentesis  Type 2 diabetes mellitus with hyperglycemia, with long-term current use of insulin (HCC) -Home regimen includes Basaglar 30 U with humalog SSI  -Start resistant SSI for now while she is NPO for anticipated procedure in the morning -CBGs ranging from 129-300 -Hemoglobin A1c was 7.4 -Monitor CBGs per protocol and adjust insulin regimen as necessary based on diet status  Chronic alcoholic cirrhosis of liver with ascites ([facility]) -CT A/P shows moderate ascites around liver and spleen but likely due to ongoing infection. Does not appear to have liver decompensation at this time. -May end up getting a paracentesis as above -continue Lasix and Spirolactone -Sent from GI office and May need their involcement   Hyponatremia -Patient's sodium has gone from 135 -> 130 -> 133 -> 130 -Continue to Monitor and Trend -Repeat CMP in the AM   DVT prophylaxis: SCDs Start: 04/07/22 0043    Code Status: Full Code Family Communication: No family present at bedside   Disposition Plan:  Level of care: Telemetry Status is: Inpatient Remains inpatient appropriate because: Needs further interventional radiology and general surgery evaluation and assistance.  And likely will be going for percutaneous drain versus aspiration   Consultants:  Interventional Radiology General Surgery   Procedures:  As delineated as above   Antimicrobials:  Anti-infectives (From admission, onward)    Start     Dose/Rate Route Frequency Ordered Stop   04/06/22 2200  Ampicillin-Sulbactam (UNASYN) 3 g in sodium chloride 0.9 % 100 mL IVPB        3 g 200 mL/hr over 30 Minutes Intravenous Every 6 hours 04/06/22 2042     04/06/22 2030   metroNIDAZOLE (FLAGYL) IVPB 500 mg  Status:  Discontinued        500 mg 100 mL/hr over 60 Minutes Intravenous Every 12 hours 04/06/22 2023 04/06/22 2029        Subjective: Seen examined at bedside and was having some pain in her flank.  Denies any chest pain or shortness breath.  No nausea or vomiting.  No other concerns or close at this time.  Objective: Vitals:   04/07/22 0430 04/07/22 0537 04/07/22 0600 04/07/22 0853  BP: 106/69 127/80 111/74 116/78  Pulse: 90 95 92 98  Resp:  16 14 15   Temp:  99.2 F (37.3 C)    TempSrc:  Oral    SpO2: 98% 98% 97% 95%  Weight:      Height:        Intake/Output Summary (Last 24 hours) at 04/07/2022 0902 Last data filed at 04/07/2022 0402 Gross per 24 hour  Intake 198.46 ml  Output --  Net 198.46 ml   Filed Weights   04/06/22 1411  Weight: 64.4 kg   Examination: Physical Exam:  Constitutional: WN/WD Caucasian female in no acute distress Respiratory: Diminished to auscultation bilaterally, no wheezing, rales, rhonchi or crackles. Normal respiratory effort and patient is not tachypenic. No accessory muscle use.  Unlabored breathing Cardiovascular: RRR, no murmurs / rubs / gallops. S1 and S2 auscultated. No extremity edema. 2+ pedal pulses. No carotid bruits.  Abdomen: Soft, non-tender,  distended secondary body habitus.  Bowel sounds positive.  GU: Deferred.  Has an abscess on the left flank Musculoskeletal: No clubbing / cyanosis of digits/nails. No joint deformity upper and lower extremities.  Skin: Cellulitic abscess appearing in the left flank Neurologic: CN 2-12 grossly intact with no focal deficits. Romberg sign and cerebellar reflexes not assessed.  Psychiatric: Normal judgment and insight. Alert and oriented x 3. Normal mood and appropriate affect.   Data Reviewed: I have personally reviewed following labs and imaging studies  CBC: Recent Labs  Lab 04/06/22 1438 04/07/22 0220  WBC 6.4 7.7  NEUTROABS 4.9  --   HGB 12.7  12.1  HCT 39.0 35.9*  MCV 87.1 84.1  PLT 167 [id number]   Basic Metabolic Panel: Recent Labs  Lab 04/06/22 1048 04/06/22 1438 04/07/22 0220  NA 130* 133* 130*  K 3.9 3.7 4.2  CL 94* 97* 97*  CO2 27 22 24   GLUCOSE 255* 280* 243*  BUN 32* 35* 35*  CREATININE 0.90 0.93 0.92  CALCIUM 10.1 9.6 9.2   GFR: Estimated Creatinine Clearance: 66.4 mL/min (by C-G formula based on SCr of 0.92 mg/dL). Liver Function Tests: Recent Labs  Lab 04/06/22 1438  AST 27  ALT 24  ALKPHOS 299*  BILITOT 1.0  PROT 8.3*  ALBUMIN 3.5   Recent Labs  Lab 04/06/22 2130  LIPASE 34   No results for input(s): "AMMONIA" in the last 168 hours. Coagulation Profile: No results for input(s): "INR", "PROTIME" in the last 168 hours. Cardiac Enzymes: No results for input(s): "CKTOTAL", "CKMB", "CKMBINDEX", "TROPONINI" in the last 168 hours. BNP (last 3 results) Recent Labs    11/28/21 0831  PROBNP 69.0   HbA1C: Recent Labs    04/06/22 1019  HGBA1C 7.4*   CBG: Recent Labs  Lab 04/06/22 1424 04/06/22 2314 04/07/22 0753  GLUCAP 300* 226* 185*   Lipid Profile: No results for input(s): "CHOL", "HDL", "LDLCALC", "TRIG", "CHOLHDL", "LDLDIRECT" in the last 72 hours. Thyroid Function Tests: No results for input(s): "TSH", "T4TOTAL", "FREET4", "T3FREE", "THYROIDAB" in the last 72 hours. Anemia Panel: No results for input(s): "VITAMINB12", "FOLATE", "FERRITIN", "TIBC", "IRON", "RETICCTPCT" in the last 72 hours. Sepsis Labs: No results for input(s): "PROCALCITON", "LATICACIDVEN" in the last 168 hours.  No results found for this or any previous visit (from the past 240 hour(s)).   Radiology Studies: CT Abdomen Pelvis W Contrast  Result Date: 04/06/2022 CLINICAL DATA:  Recurrent abscess with communication with the pancreas. Patient states she had a pseudocyst on her pancreas that extended into the left flank with drainage. The patient became septic and was hospitalized for a month. EXAM: CT ABDOMEN AND  PELVIS WITH CONTRAST TECHNIQUE: Multidetector CT imaging of the abdomen and pelvis was performed using the standard protocol following bolus administration of intravenous contrast. RADIATION DOSE REDUCTION: This exam was performed according to the departmental dose-optimization program which includes automated exposure control, adjustment of the mA and/or kV according to patient size and/or use of iterative reconstruction technique. CONTRAST:  197mL OMNIPAQUE IOHEXOL 300 MG/ML  SOLN COMPARISON:  CT scan of the abdomen and pelvis January 22, 2022 and March 26, 2022 FINDINGS: Lower chest: No acute abnormality. Hepatobiliary: The liver demonstrates cirrhotic changes with a nodular contour. No liver mass identified. Portal vein is patent. The patient is status post cholecystectomy. Prominence of the common bile duct and central intrahepatic ducts is likely from previous cholecystectomy and is stable. Pancreas: Pancreatic head, neck, and body are stable. A few calcifications in the pancreatic  head are likely due to previous pancreatitis. There is fluid near the pancreatic tail which could be in the talar adjacent. Pancreas is otherwise stable. Spleen: The spleen is prominent but stable. Adrenals/Urinary Tract: Adrenal glands are unremarkable. Kidneys are normal, without renal calculi, focal lesion, or hydronephrosis. Bladder is unremarkable. Stomach/Bowel: The stomach and small bowel are normal. Moderate fecal loading in the colon. The colon is otherwise unremarkable. The appendix is unremarkable. Vascular/Lymphatic: Calcified atherosclerotic changes are identified in the abdominal aorta, extending into the iliac and femoral vessels. No adenopathy. Splenic varices are noted. Reproductive: Uterus and bilateral adnexa are unremarkable. Other: There is moderate ascites in the abdomen adjacent to the liver and spleen. There is also significant ascites in the pelvis. The amount of ascites is similar since March 26, 2022. There is a large peripherally enhancing complex fluid collection. The collection extends from near the tail of the pancreas posteriorly and inferiorly through the left posterior musculature, X into the subcutaneous fat. The collection extends to the skin surface. Again, the margins are irregular and the collection is difficult to measure. The collection is significantly larger compared to March 26, 2022 1 only a small amount of fluid remain near the pancreatic tail. However, the collection is a little smaller than the January 22, 2022 study. Musculoskeletal: No acute or significant osseous findings. IMPRESSION: 1. There is a large peripherally enhancing complex fluid collection in the left abdomen extending from the tail of the pancreas posteriorly and inferiorly into the subcutaneous fat. The collection is significantly larger compared to March 26, 2022. However, the collection is a little smaller than the January 22, 2022 study. The collection extends to the skin surface. This collection may represent a pseudocyst given previous history. An abscess could have a similar appearance. Whether the collection is sterile or infected cannot be determined on this study. There is no obvious air in the collection. 2. Cirrhosis with evidence of portal venous hypertension including splenic varices and moderate ascites. 3. Moderate fecal loading in the colon. 4. Calcified atherosclerotic changes in the abdominal aorta, iliac vessels, and femoral vessels. 5. Aortic atherosclerosis. Aortic Atherosclerosis (ICD10-I70.0). Electronically Signed   By: Dorise Bullion III M.D.   On: 04/06/2022 18:29    Scheduled Meds:  vitamin B-12  5,000 mcg Oral Daily   furosemide  20 mg Oral Daily   insulin aspart  0-20 Units Subcutaneous TID PC & HS   loratadine  10 mg Oral Daily   spironolactone  50 mg Oral Daily   traZODone  100 mg Oral QHS   Continuous Infusions:  ampicillin-sulbactam (UNASYN) IV Stopped (04/07/22  0402)    LOS: 1 day   Raiford Noble, DO [facility] Available via Epic secure chat 7am-7pm After these hours, please refer to coverage provider listed on [email or web address] 04/07/2022, 9:02 AM

## 2022-04-07 NOTE — Assessment & Plan Note (Signed)
-  Home regimen includes Basaglar 30 U with humalog SSI  -start resistant SSI for now while she is NPO for anticipated procedure in the morning

## 2022-04-07 NOTE — Assessment & Plan Note (Addendum)
-  Hx of large left posterior abdominal wall abscess in September requiring IR ultrasound-guided aspiration and I&D with general surgery. Anaerobic/aerobic culture at that time positive for Klebsiella pneumoniae. -CT abdomen pelvis today showed large peripherally enhancing complex fluid collection that extends from the tail of the pancreas posteriorly and inferiorly through the left posterior musculature into the subcutaneous fat and skin surface.  There is moderate ascites in the abdomen adjacent to the liver and spleen. -will start IV Unasyn as she has sensitivity to it with the past retroperitoneal abscess -General surgery Dr. Rayburn Ma consulted and will see in consultation in the morning

## 2022-04-07 NOTE — Consult Note (Signed)
Reason for Consult: Flank abscess Referring Physician: Dr. Kelby Stewart is an 61 y.o. female.  HPI: This is a 61 year old female known to [facility] and [facility]. [facility] Stewart with chronic pancreatitis and cirrhosis.  She had undergone incision and drainage of the back abscess in September by [facility]. [facility]ory Stewart.  She then underwent an ERCP by [facility]. [facility]ory Stewart who did a biliary and pancreatic sphincterotomy.  M[facility]ory. Mallory Stewart was able to remove the drain in October and saw her last on 11/16.  During her admission she also had drainage of her ascites with paracentesis.  Her CT scan 11/20 showed significant improvement in the fluid collections in the retroperitoneal space there was still moderate ascites.  Approximately 2 to 3 days ago she began having redness and swelling at her left flank and back at the site of the previous abscess.  Repeat CT scan shows another complex fluid collection in the left abdomen extending into the subcutaneous tissue.  She denies fevers or chills.  Past Medical History:  Diagnosis Date   Alcohol-induced chronic pancreatitis (HCC)    Alcoholic cirrhosis (HCC)    B12 deficiency    Back abscess    C. difficile colitis    Concussion    DDD (degenerative disc disease), cervical    Diabetes (HCC)    DKA, type 2 (HCC)    Gastric outlet obstruction 08/12/2018   Hypertension    Neuropathy    Pancreatic pseudocyst/cyst 05/06/2013   Seasonal allergies     Past Surgical History:  Procedure Laterality Date   ANKLE ARTHROSCOPY Right 12/20/2020   Procedure: RIGHT ANKLE ARTHROSCOPIC DEBRIDEMENT;  Surgeon: Nadara Mustard, MD;  Location: [facility];  Service: Orthopedics;  Laterality: Right;   BILIARY BRUSHING  01/27/2022   Procedure: BILIARY BRUSHING;  Surgeon: Meridee Score Netty Starring., MD;  Location: [facility];  Service: Gastroenterology;;   BIOPSY  01/19/2019   Procedure: BIOPSY;  Surgeon: Lemar Lofty., MD;  Location: [facility];  Service: Gastroenterology;;    CHOLECYSTECTOMY N/A 07/28/2016   Procedure: LAPAROSCOPIC CHOLECYSTECTOMY WITH INTRAOPERATIVE CHOLANGIOGRAM;  Surgeon: Rodman Pickle, MD;  Location[facility];  Service: General;  Laterality: N/A;   ERCP N/A 01/27/2022   Procedure: ENDOSCOPIC RETROGRADE CHOLANGIOPANCREATOGRAPHY (ERCP);  Surgeon: Lemar Lofty., MD;  Location: [facility];  Service: Gastroenterology;  Laterality: N/A;   ESOPHAGOGASTRODUODENOSCOPY (EGD) WITH PROPOFOL N/A 08/13/2018   Procedure: ESOPHAGOGASTRODUODENOSCOPY (EGD) WITH PROPOFOL;  Surgeon: Meridee Score Netty Starring., MD;  Location: [facility];  Service: Gastroenterology;  Laterality: N/A;   ESOPHAGOGASTRODUODENOSCOPY (EGD) WITH PROPOFOL N/A 01/19/2019   Procedure: ESOPHAGOGASTRODUODENOSCOPY (EGD) WITH PROPOFOL;  Surgeon: Meridee Score Netty Starring., MD;  Location: [facility] ENDOSCOPY;  Service: Gastroenterology;  Laterality: N/A;   ESOPHAGOGASTRODUODENOSCOPY (EGD) WITH PROPOFOL N/A 12/28/2021   Procedure: ESOPHAGOGASTRODUODENOSCOPY (EGD) WITH PROPOFOL;  Surgeon: Meridee Score Netty Starring., MD;  Location: [facility];  Service: Gastroenterology;  Laterality: N/A;   EUS N/A 12/28/2021   Procedure: UPPER ENDOSCOPIC ULTRASOUND (EUS) LINEAR;  Surgeon: Lemar Lofty., MD;  Location: [facility];  Service: Gastroenterology;  Laterality: N/A;   INCISION AND DRAINAGE ABSCESS N/A 01/25/2022   Procedure: INCISION AND DRAINAGE BACK ABSCESS;  Surgeon: Sheliah Hatch, De Blanch, MD;  Location: [facility] ORS;  Service: General;  Laterality: N/A;  90 L DOW   IR PARACENTESIS  08/08/2020   IR PARACENTESIS  03/13/2021   IR PARACENTESIS  02/23/2022   IR US GUIDE BX ASP/DRAIN  01/23/2022   LEEP  1990's   ORIF FOOT FRACTURE  09/2008   L 5th  metatarsal   PANCREATIC STENT PLACEMENT  01/27/2022   Procedure: PANCREATIC STENT PLACEMENT;  Surgeon: Meridee Score Netty Starring., MD;  Location: [facility];  Service: Gastroenterology;;   REFRACTIVE SURGERY  2000   REMOVAL OF STONES  01/27/2022   Procedure: REMOVAL  OF STONES;  Surgeon: Lemar Lofty., MD;  Location: [facility];  Service: Gastroenterology;;   [name]  01/27/2022   Procedure: [name];  Surgeon: Lemar Lofty., MD;  Location: [facility];  Service: Gastroenterology;;   TIBIA IM NAIL INSERTION Left 03/12/2021   Procedure: INTRAMEDULLARY (IM) NAIL TIBIAL;  Surgeon: Kerrin Champagne, MD;  Location: [facility] OR;  Service: Orthopedics;  Laterality: Left;   TONSILLECTOMY     UPPER ESOPHAGEAL ENDOSCOPIC ULTRASOUND (EUS) N/A 08/13/2018   Procedure: UPPER ESOPHAGEAL ENDOSCOPIC ULTRASOUND (EUS);  Surgeon: Lemar Lofty., MD;  Location: [facility] ENDOSCOPY;  Service: Gastroenterology;  Laterality: N/A;   UPPER ESOPHAGEAL ENDOSCOPIC ULTRASOUND (EUS) N/A 01/19/2019   Procedure: UPPER ESOPHAGEAL ENDOSCOPIC ULTRASOUND (EUS);  Surgeon: Lemar Lofty., MD;  Location: [facility] ENDOSCOPY;  Service: Gastroenterology;  Laterality: N/A;    Family History  Problem Relation Age of Onset   Other Mother        tachycardia.Marland KitchenMarland Kitchen?afib   Stroke Father        after hernia suegery   Prostate cancer Father    Other Father        global transient amnesia, unclear source   Atrial fibrillation Sister    Healthy Brother    Healthy Brother    Coronary artery disease Paternal Grandmother    Heart attack Paternal Grandmother 34   Brain cancer Maternal Grandfather        ?   Cancer Paternal Grandfather        ?   Breast cancer Maternal Grandmother     Social History:  reports that she has never smoked. She has never used smokeless tobacco. She reports that she does not currently use alcohol after a past usage of about 7.0 standard drinks of alcohol per week. She reports that she does not use drugs.  Allergies:  Allergies  Allergen Reactions   Ceftriaxone Hives    Immediate bright red itchy rash on bilateral legs after starting infusion.     Gluten Meal Other (See Comments)    Stomach distress   Zocor [Simvastatin] Other (See Comments)     Elevated lfts?    Medications: I have reviewed the patient's current medications.  Results for orders placed or performed during the hospital encounter of 04/06/22 (from the past 48 hour(s))  CBG monitoring, ED     Status: Abnormal   Collection Time: 04/06/22  2:24 PM  Result Value Ref Range   Glucose-Capillary 300 (H) 70 - 99 mg/dL    Comment: Glucose reference range applies only to samples taken after fasting for at least 8 hours.  Comprehensive metabolic panel     Status: Abnormal   Collection Time: 04/06/22  2:38 PM  Result Value Ref Range   Sodium 133 (L) 135 - 145 mmol/L   Potassium 3.7 3.5 - 5.1 mmol/L   Chloride 97 (L) 98 - 111 mmol/L   CO2 22 22 - 32 mmol/L   Glucose, Bld 280 (H) 70 - 99 mg/dL    Comment: Glucose reference range applies only to samples taken after fasting for at least 8 hours.   BUN 35 (H) 6 - 20 mg/dL   Creatinine, Ser 9.21 0.44 - 1.00 mg/dL   Calcium 9.6 8.9 - 19.4 mg/dL  Total Protein 8.3 (H) 6.5 - 8.1 g/dL   Albumin 3.5 3.5 - 5.0 g/dL   AST 27 15 - 41 U/L   ALT 24 0 - 44 U/L   Alkaline Phosphatase 299 (H) 38 - 126 U/L   Total Bilirubin 1.0 0.3 - 1.2 mg/dL   GFR, Estimated >62 >95 mL/min    Comment: (NOTE) Calculated using the CKD-EPI Creatinine Equation (2021)    Anion gap 14 5 - 15    Comment: Performed at [facility], [address] [place], [place] [address]  CBC with Differential     Status: None   Collection Time: 04/06/22  2:38 PM  Result Value Ref Range   WBC 6.4 4.0 - 10.5 K/uL   RBC 4.48 3.87 - 5.11 MIL/uL   Hemoglobin 12.7 12.0 - 15.0 g/dL   HCT 24.4 01.0 - 27.2 %   MCV 87.1 80.0 - 100.0 fL   MCH 28.3 26.0 - 34.0 pg   MCHC 32.6 30.0 - 36.0 g/dL   RDW 53.6 64.4 - 03.4 %   Platelets 167 150 - 400 K/uL   nRBC 0.0 0.0 - 0.2 %   Neutrophils Relative % 76 %   Neutro Abs 4.9 1.7 - 7.7 K/uL   Lymphocytes Relative 12 %   Lymphs Abs 0.8 0.7 - 4.0 K/uL   Monocytes Relative 10 %   Monocytes Absolute 0.7 0.1 - 1.0  K/uL   Eosinophils Relative 1 %   Eosinophils Absolute 0.0 0.0 - 0.5 K/uL   Basophils Relative 0 %   Basophils Absolute 0.0 0.0 - 0.1 K/uL   Immature Granulocytes 1 %   Abs Immature Granulocytes 0.03 0.00 - 0.07 K/uL    Comment: Performed at [facility], [address], [place], [place] [address]  Lipase, blood     Status: None   Collection Time: 04/06/22  9:30 PM  Result Value Ref Range   Lipase 34 11 - 51 U/L    Comment: Performed at [facility], [address] [place], [place] [address]  CBG monitoring, ED     Status: Abnormal   Collection Time: 04/06/22 11:14 PM  Result Value Ref Range   Glucose-Capillary 226 (H) 70 - 99 mg/dL    Comment: Glucose reference range applies only to samples taken after fasting for at least 8 hours.   Comment 1 Notify RN   CBC     Status: Abnormal   Collection Time: 04/07/22  2:20 AM  Result Value Ref Range   WBC 7.7 4.0 - 10.5 K/uL   RBC 4.27 3.87 - 5.11 MIL/uL   Hemoglobin 12.1 12.0 - 15.0 g/dL   HCT 56.4 (L) 33.2 - 95.1 %   MCV 84.1 80.0 - 100.0 fL   MCH 28.3 26.0 - 34.0 pg   MCHC 33.7 30.0 - 36.0 g/dL   RDW 88.4 16.6 - 06.3 %   Platelets 190 150 - 400 K/uL   nRBC 0.0 0.0 - 0.2 %    Comment: Performed at [facility], [address] [place], [place] [address]  Basic metabolic panel     Status: Abnormal   Collection Time: 04/07/22  2:20 AM  Result Value Ref Range   Sodium 130 (L) 135 - 145 mmol/L   Potassium 4.2 3.5 - 5.1 mmol/L   Chloride 97 (L) 98 - 111 mmol/L   CO2 24 22 - 32 mmol/L   Glucose, Bld 243 (H) 70 - 99 mg/dL  Comment: Glucose reference range applies only to samples taken after fasting for at least 8 hours.   BUN 35 (H) 6 - 20 mg/dL   Creatinine, Ser 8.09 0.44 - 1.00 mg/dL   Calcium 9.2 8.9 - 98.3 mg/dL   GFR, Estimated >38 >25 mL/min    Comment: (NOTE) Calculated using the CKD-EPI Creatinine Equation (2021)    Anion gap 9 5 - 15    Comment: Performed at  [facility], [address], [place], [place] [address]   *Note: Due to a large number of results and/or encounters for the requested time period, some results have not been displayed. A complete set of results can be found in Results Review.    CT Abdomen Pelvis W Contrast  Result Date: 04/06/2022 CLINICAL DATA:  Recurrent abscess with communication with the pancreas. Patient states she had a pseudocyst on her pancreas that extended into the left flank with drainage. The patient became septic and was hospitalized for a month. EXAM: CT ABDOMEN AND PELVIS WITH CONTRAST TECHNIQUE: Multidetector CT imaging of the abdomen and pelvis was performed using the standard protocol following bolus administration of intravenous contrast. RADIATION DOSE REDUCTION: This exam was performed according to the departmental dose-optimization program which includes automated exposure control, adjustment of the mA and/or kV according to patient size and/or use of iterative reconstruction technique. CONTRAST:  OMNIPAQUE IOHEXOL 300 MG/ML  SOLN COMPARISON:  CT scan of the abdomen and pelvis January 22, 2022 and March 26, 2022 FINDINGS: Lower chest: No acute abnormality. Hepatobiliary: The liver demonstrates cirrhotic changes with a nodular contour. No liver mass identified. Portal vein is patent. The patient is status post cholecystectomy. Prominence of the common bile duct and central intrahepatic ducts is likely from previous cholecystectomy and is stable. Pancreas: Pancreatic head, neck, and body are stable. A few calcifications in the pancreatic head are likely due to previous pancreatitis. There is fluid near the pancreatic tail which could be in the talar adjacent. Pancreas is otherwise stable. Spleen: The spleen is prominent but stable. Adrenals/Urinary Tract: Adrenal glands are unremarkable. Kidneys are normal, without renal calculi, focal lesion, or hydronephrosis. Bladder is unremarkable.  Stomach/Bowel: The stomach and small bowel are normal. Moderate fecal loading in the colon. The colon is otherwise unremarkable. The appendix is unremarkable. Vascular/Lymphatic: Calcified atherosclerotic changes are identified in the abdominal aorta, extending into the iliac and femoral vessels. No adenopathy. Splenic varices are noted. Reproductive: Uterus and bilateral adnexa are unremarkable. Other: There is moderate ascites in the abdomen adjacent to the liver and spleen. There is also significant ascites in the pelvis. The amount of ascites is similar since March 26, 2022. There is a large peripherally enhancing complex fluid collection. The collection extends from near the tail of the pancreas posteriorly and inferiorly through the left posterior musculature, X into the subcutaneous fat. The collection extends to the skin surface. Again, the margins are irregular and the collection is difficult to measure. The collection is significantly larger compared to March 26, 2022 1 only a small amount of fluid remain near the pancreatic tail. However, the collection is a little smaller than the January 22, 2022 study. Musculoskeletal: No acute or significant osseous findings. IMPRESSION: 1. There is a large peripherally enhancing complex fluid collection in the left abdomen extending from the tail of the pancreas posteriorly and inferiorly into the subcutaneous fat. The collection is significantly larger compared to March 26, 2022. However, the collection is a little smaller than the January 22, 2022 study. The collection extends to the skin surface. This collection may represent a pseudocyst given previous history. An abscess could have a similar appearance. Whether the collection is sterile or infected cannot be determined on this study. There is no obvious air in the collection. 2. Cirrhosis with evidence of portal venous hypertension including splenic varices and moderate ascites. 3. Moderate fecal  loading in the colon. 4. Calcified atherosclerotic changes in the abdominal aorta, iliac vessels, and femoral vessels. 5. Aortic atherosclerosis. Aortic Atherosclerosis (ICD10-I70.0). Electronically Signed   By: [facility] Samavid  Williams Stewart M.D.   On: 04/06/2022 18:29    Review of Systems  All other systems reviewed and are negative.  Blood pressure 111/74, pulse 92, temperature 99.2 F (37.3 C), temperature source Oral, resp. rate 14, height 5\' 8"  (1.727 m), weight 64.4 kg, SpO2 97 %. Physical Exam Constitutional:      General: She is not in acute distress.    Appearance: Normal appearance.  HENT:     Head: Normocephalic and atraumatic.     Right Ear: External ear normal.     Left Ear: External ear normal.     Nose: Nose normal.  Eyes:     General: No scleral icterus. Cardiovascular:     Rate and Rhythm: Normal rate and regular rhythm.  Pulmonary:     Effort: Pulmonary effort is normal.     Breath sounds: Normal breath sounds.  Abdominal:     General: Abdomen is flat.     Palpations: Abdomen is soft.     Comments: Her abdomen is soft nontender.  On her left flank going toward the back there is a fullness with some erythema.  Her previous incisions are well-healed.  There is mild to moderate tenderness  Neurological:     Mental Status: She is alert.     Assessment/Plan: Recurrent flank and intra-abdominal abscess versus infected pseudocyst  I have discussed this with the patient.  We discussed having IR consult to consider a percutaneous drain as this may likely still be an infected pseudocyst versus open drainage in the operating room.  As she is in minimal distress she would like to try percutaneous drain first rather than a big open wound that I feel is completely reasonable given her current condition and likelihood of possible recurrence.  She may require paracentesis of her ascites while she is in the hospital.  Antibiotics will be continued.  She may be started on a diet only after  she has been seen by IR and determine whether or not they can place a drain.  Given her chronic liver disease and chronic pancreatitis, this remains a difficult situation.  Abigail Miyamotoouglas Mehran Guderian MD 04/07/2022, 7:35 AM

## 2022-04-07 NOTE — Assessment & Plan Note (Addendum)
-  CT A/P shows moderate ascites around liver and spleen but likely due to ongoing infection. Does not appear to have liver decompensation at this time. -continue Lasix and Spirolactone

## 2022-04-07 NOTE — ED Notes (Signed)
Report given to Sedalia Muta on 3E

## 2022-04-08 DIAGNOSIS — L02211 Cutaneous abscess of abdominal wall: Secondary | ICD-10-CM | POA: Diagnosis not present

## 2022-04-08 DIAGNOSIS — E1165 Type 2 diabetes mellitus with hyperglycemia: Secondary | ICD-10-CM | POA: Diagnosis not present

## 2022-04-08 DIAGNOSIS — D649 Anemia, unspecified: Secondary | ICD-10-CM | POA: Diagnosis not present

## 2022-04-08 DIAGNOSIS — K7031 Alcoholic cirrhosis of liver with ascites: Secondary | ICD-10-CM | POA: Diagnosis not present

## 2022-04-08 LAB — CBC WITH DIFFERENTIAL/PLATELET
Abs Immature Granulocytes: 0.02 10*3/uL (ref 0.00–0.07)
Basophils Absolute: 0 10*3/uL (ref 0.0–0.1)
Basophils Relative: 1 %
Eosinophils Absolute: 0.1 10*3/uL (ref 0.0–0.5)
Eosinophils Relative: 2 %
HCT: 32.2 % — ABNORMAL LOW (ref 36.0–46.0)
Hemoglobin: 10.5 g/dL — ABNORMAL LOW (ref 12.0–15.0)
Immature Granulocytes: 0 %
Lymphocytes Relative: 21 %
Lymphs Abs: 1.2 10*3/uL (ref 0.7–4.0)
MCH: 27.8 pg (ref 26.0–34.0)
MCHC: 32.6 g/dL (ref 30.0–36.0)
MCV: 85.2 fL (ref 80.0–100.0)
Monocytes Absolute: 0.7 10*3/uL (ref 0.1–1.0)
Monocytes Relative: 12 %
Neutro Abs: 3.7 10*3/uL (ref 1.7–7.7)
Neutrophils Relative %: 64 %
Platelets: 172 10*3/uL (ref 150–400)
RBC: 3.78 MIL/uL — ABNORMAL LOW (ref 3.87–5.11)
RDW: 13.4 % (ref 11.5–15.5)
WBC: 5.7 10*3/uL (ref 4.0–10.5)
nRBC: 0 % (ref 0.0–0.2)

## 2022-04-08 LAB — COMPREHENSIVE METABOLIC PANEL
ALT: 20 U/L (ref 0–44)
AST: 23 U/L (ref 15–41)
Albumin: 3 g/dL — ABNORMAL LOW (ref 3.5–5.0)
Alkaline Phosphatase: 290 U/L — ABNORMAL HIGH (ref 38–126)
Anion gap: 10 (ref 5–15)
BUN: 31 mg/dL — ABNORMAL HIGH (ref 6–20)
CO2: 20 mmol/L — ABNORMAL LOW (ref 22–32)
Calcium: 8.5 mg/dL — ABNORMAL LOW (ref 8.9–10.3)
Chloride: 101 mmol/L (ref 98–111)
Creatinine, Ser: 0.95 mg/dL (ref 0.44–1.00)
GFR, Estimated: >60 mL/min (ref >60)
Glucose, Bld: 161 mg/dL — ABNORMAL HIGH (ref 70–99)
Potassium: 3.7 mmol/L (ref 3.5–5.1)
Sodium: 131 mmol/L — ABNORMAL LOW (ref 135–145)
Total Bilirubin: 0.8 mg/dL (ref 0.3–1.2)
Total Protein: 6.7 g/dL (ref 6.5–8.1)

## 2022-04-08 LAB — GLUCOSE, CAPILLARY
Glucose-Capillary: 159 mg/dL — ABNORMAL HIGH (ref 70–99)
Glucose-Capillary: 291 mg/dL — ABNORMAL HIGH (ref 70–99)
Glucose-Capillary: 213 mg/dL — ABNORMAL HIGH (ref 70–99)
Glucose-Capillary: 187 mg/dL — ABNORMAL HIGH (ref 70–99)

## 2022-04-08 LAB — PHOSPHORUS: Phosphorus: 3.4 mg/dL (ref 2.5–4.6)

## 2022-04-08 LAB — MAGNESIUM: Magnesium: 1.6 mg/dL — ABNORMAL LOW (ref 1.7–2.4)

## 2022-04-08 MED ORDER — VANCOMYCIN HCL 125 MG PO CAPS
125.0000 mg | ORAL_CAPSULE | Freq: Every day | ORAL | Status: DC
  Administered 2022-04-08 – 2022-04-09 (×2): 125 mg via ORAL
  Filled 2022-04-08 (×2): qty 1

## 2022-04-08 MED ORDER — OXYCODONE HCL 5 MG PO TABS
5.0000 mg | ORAL_TABLET | Freq: Four times a day (QID) | ORAL | Status: DC | PRN
  Administered 2022-04-08 – 2022-04-10 (×5): 5 mg via ORAL
  Filled 2022-04-08 (×5): qty 1

## 2022-04-08 MED ORDER — MAGNESIUM SULFATE 2 GM/50ML IV SOLN
2.0000 g | Freq: Once | INTRAVENOUS | Status: AC
  Administered 2022-04-08: 2 g via INTRAVENOUS
  Filled 2022-04-08: qty 50

## 2022-04-08 NOTE — Progress Notes (Signed)
PROGRESS NOTE    Mallory Stewart  [id number] DOB: 01/18/1961 DOA: 04/06/2022 PCP: [name], MD   Brief Narrative:  The patient is a 62 year old Caucasian female with a past medical history significant for but not limited to hypertension, portal hypertension, history of gastric varices, chronic alcoholic pancreatitis with pseudocyst, history of alcoholic cirrhosis of the liver, diabetes mellitus type 2, hyperlipidemia, history of C. difficile colitis as well as other comorbidities who presents with recurrent flank abscess.  Of note she was hospitalized from 01/22/2022 until 01/29/2022 with sepsis secondary cellulitis and a large abscess collection.  She was also hospitalized in August for infected noted pseudocyst and was found to have cellulitis of the left leg 24 hours hemoglobin with a large abscess of the left posterior pararenal space with extension the posterior left abdominal wall.  IR was consulted and she underwent ultrasound-guided aspiration of the left posterior abdominal wall collection.  General surgery did I&D on 01/05/2022 and aerobic and anaerobic culture showed Klebsiella pneumonia.  She was treated with Unasyn and discharged on Augmentin for 4 weeks per ID.  She is prescribed oral vancomycin for C. difficile prophylaxis.  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  She had repeat CT of the abdomen pelvis on 03/29/2022 showing complete resolution of the abscess and retroperitoneal space.  However about 6 days ago she began to feel flank pain and started developing chills.  She felt her back and knew that she had an infection that returned so she went to go gastroenterology on 04/06/2022 who directed her to the ED.   In the ED she had no leukocytosis or anemia and she did have elevated glucose of  255.  CT scan of the abdomen pelvis showed large peripherally enhancing complex fluid collection extends from the tail of the pancreas posterior and evaluate through the left posterior musculature into the subcutaneous fat and skin surface.  There is moderate ascites in the abdomen adjacent to the liver and spleen.  The EDP discussed with general surgery who consulted and recommended having IR consider percutaneous drain as they felt that she may still have likely a infected pseudocyst.  There is consideration for open drainage in the operating room and given that she is in minimal distress surgery recommended trying a percutaneous drain first.  Antibiotics are being resumed and continued and IR has evaluated and they will do a possible percutaneous aspiration and drain placement today and if there is some infectious mono ascites we will also perform paracentesis given that she is n.p.o.    Assessment and Plan: * Abscess of flank -Hx of large left posterior abdominal wall abscess in September requiring IR ultrasound-guided aspiration and I&D with general surgery. Anaerobic/aerobic culture at that time positive for Klebsiella pneumoniae. -CT abdomen pelvis today showed large peripherally enhancing complex fluid collection that extends from the tail of the pancreas posteriorly and inferiorly through the left posterior musculature into the subcutaneous fat and skin surface.  There is moderate ascites in the abdomen adjacent to the liver and spleen. -Was started on IV Unasyn as she has sensitivity to it with the past retroperitoneal abscess and will continue -General surgery Dr. Rush Farmer consulted and will see in consultation in the morning and after evaluation here recommending continue antibiotics and getting interventional radiology involved to determine whether he can  place a drain or not; she is leaning towards a percutaneous drain as this pseudocyst might may still likely be infected but she may need to go  to the operating room for open drainage but given her chronic liver disease and chronic necrotized this remains a difficult situation -IR consulted and patient was taken to the interventional radiology suite and had an ultrasound-guided placement into the retroperitoneal abscess with a 12 French locking placed to bulb and samples been sent for micro; cultures are growing gram-negative rods with sensitivities pending. -Will order a paracentesis -General surgery sending a embolus level and recommending antibiotics and continuing diet today.  They are uncertain if this is a pancreatic fistula in light of the patient's cirrhosis   Type 2 diabetes mellitus with hyperglycemia, with long-term current use of insulin (HCC) -Home regimen includes Basaglar 30 U with humalog SSI  -Start resistant SSI for now while she is NPO for anticipated procedure in the morning -CBGs ranging from 159-291 -Hemoglobin A1c was 7.4 -Monitor CBGs per protocol and adjust insulin regimen as necessary based on diet status   Chronic alcoholic cirrhosis of liver with ascites ([place]) -CT A/P shows moderate ascites around liver and spleen but likely due to ongoing infection. Does not appear to have liver decompensation at this time. -Will order a ultrasound of the abdomen paracentesis to evaluate and see if she can benefit from paracentesis -continue Lasix and Spirolactone -Sent from GI office and May need their involvement and will likely consult them tomorrow given that lumbar GI is back on service tomorrow   Hyponatremia -Patient's sodium has gone from 135 -> 130 -> 133 -> 130 -> 131 -Continue to Monitor and Trend -Repeat CMP in the AM    Hypomagnesemia  -Patient's magnesium is now 1.6 -Replete with IV mag sulfate 2 g -Continue to monitor and replete as necessary -Repeat mag in a.m.  Normocytic Anemia -Patient's Hemoglobin/Hematocrit has gone from 12.1/35.9 is now 10.5/32.2 -Check anemia panel in a.m. -Continue to  monitor for signs and symptoms bleeding; no overt bleeding noted -Repeat CBC in the a.m.  Metabolic Acidosis -Patient's CO2 is now 20, anion gap is 10, chloride level was 101 -Continue to monitor and trend and repeat CMP in a.m.  Hypoalbuminemia -Patient's albumin was low at 3.0 -Continue monitor trend and repeat CMP in a.m.  DVT prophylaxis: SCDs Start: 04/07/22 0043    Code Status: Full Code Family Communication: No family currently at bedside  Disposition Plan:  Level of care: Telemetry Status is: Inpatient Remains inpatient appropriate because: Needs further workup and continue IV antibiotics and following cultures.  Will order a paracentesis today.   Consultants:  Interventional radiology General surgery  Procedures:  Date of Procedure: 04/07/2022  Procedure: US guided drain placement   Antimicrobials:  Anti-infectives (From admission, onward)    Start     Dose/Rate Route Frequency Ordered Stop   04/06/22 2200  Ampicillin-Sulbactam (UNASYN) 3 g in sodium chloride 0.9 % 100 mL IVPB        3 g 200 mL/hr over 30 Minutes Intravenous Every 6 hours 04/06/22 2042     04/06/22 2030  metroNIDAZOLE (FLAGYL) IVPB 500 mg  Status:  Discontinued        500 mg 100 mL/hr over 60 Minutes Intravenous Every 12 hours 04/06/22 2023 04/06/22 2029       Subjective: Seen and examined at bedside and was feeling better and complaining of some "incisional pain" but no nausea or vomiting.  Abdomen remains distended.  Asking about paracentesis.  Denies any chest pain or shortness of breath.  Cultures are still growing gram-negative rods.  No other concerns or complaints at this time.  Objective: Vitals:   04/07/22 1720 04/07/22 1927 04/07/22 2317 04/08/22 0529  BP: 113/76 114/77 100/73 97/69  Pulse: 88 (!) 102 84 71  Resp: 11 18 18 18   Temp:  99.1 F (37.3 C) 98.7 F (37.1 C) 97.8 F (36.6 C)  TempSrc:  Oral Oral Oral  SpO2: 100% 99% 97% 99%  Weight:      Height:         Intake/Output Summary (Last 24 hours) at 04/08/2022 1237 Last data filed at 04/08/2022 0845 Gross per 24 hour  Intake 296.51 ml  Output 200 ml  Net 96.51 ml   Filed Weights   04/06/22 1411 04/07/22 1520  Weight: 64.4 kg 63.9 kg   Examination: Physical Exam:  Constitutional: WN/WD Caucasian female currently no acute distress appears calm Respiratory: Diminished to auscultation bilaterally, no wheezing, rales, rhonchi or crackles. Normal respiratory effort and patient is not tachypenic. No accessory muscle use.  Unlabored breathing Cardiovascular: RRR, no murmurs / rubs / gallops. S1 and S2 auscultated. No extremity edema.  Abdomen: Soft, non-tender, distended secondary body habitus. Bowel sounds positive.  GU: Deferred.  Has a JP drain in the left flank Musculoskeletal: No clubbing / cyanosis of digits/nails. No joint deformity upper and lower extremities Skin: No rashes, lesions, ulcers. No induration; Warm and dry.  Neurologic: CN 2-12 grossly intact with no focal deficits.  Romberg sign and cerebellar reflexes not assessed.  Psychiatric: Normal judgment and insight. Alert and oriented x 3. Normal mood and appropriate affect.   Data Reviewed: I have personally reviewed following labs and imaging studies  CBC: Recent Labs  Lab 04/06/22 1438 04/07/22 0220 04/08/22 0520  WBC 6.4 7.7 5.7  NEUTROABS 4.9  --  3.7  HGB 12.7 12.1 10.5*  HCT 39.0 35.9* 32.2*  MCV 87.1 84.1 85.2  PLT 167 190 172   Basic Metabolic Panel: Recent Labs  Lab 04/06/22 1048 04/06/22 1438 04/07/22 0220 04/08/22 0520  NA 130* 133* 130* 131*  K 3.9 3.7 4.2 3.7  CL 94* 97* 97* 101  CO2 27 22 24  20*  GLUCOSE 255* 280* 243* 161*  BUN 32* 35* 35* 31*  CREATININE 0.90 0.93 0.92 0.95  CALCIUM 10.1 9.6 9.2 8.5*  MG  --   --   --  1.6*  PHOS  --   --   --  3.4   GFR: Estimated Creatinine Clearance: 64.3 mL/min (by C-G formula based on SCr of 0.95 mg/dL). Liver Function Tests: Recent Labs  Lab  04/06/22 1438 04/08/22 0520  AST 27 23  ALT 24 20  ALKPHOS 299* 290*  BILITOT 1.0 0.8  PROT 8.3* 6.7  ALBUMIN 3.5 3.0*   Recent Labs  Lab 04/06/22 2130  LIPASE 34   No results for input(s): "AMMONIA" in the last 168 hours. Coagulation Profile: No results for input(s): "INR", "PROTIME" in the last 168 hours. Cardiac Enzymes: No results for input(s): "CKTOTAL", "CKMB", "CKMBINDEX", "TROPONINI" in the last 168 hours. BNP (last 3 results) Recent Labs    11/28/21 0831  PROBNP 69.0   HbA1C: Recent Labs    04/06/22 1019  HGBA1C 7.4*   CBG: Recent Labs  Lab 04/07/22 1201 04/07/22 1802 04/07/22 2036 04/08/22 0750 04/08/22 1157  GLUCAP 129* 106* 227* 159* 291*   Lipid Profile: No results for input(s): "CHOL", "HDL", "LDLCALC", "TRIG", "  CHOLHDL", "LDLDIRECT" in the last 72 hours. Thyroid Function Tests: No results for input(s): "TSH", "T4TOTAL", "FREET4", "T3FREE", "THYROIDAB" in the last 72 hours. Anemia Panel: No results for input(s): "VITAMINB12", "FOLATE", "FERRITIN", "TIBC", "IRON", "RETICCTPCT" in the last 72 hours. Sepsis Labs: No results for input(s): "PROCALCITON", "LATICACIDVEN" in the last 168 hours.  Recent Results (from the past 240 hour(s))  Aerobic/Anaerobic Culture w Gram Stain (surgical/deep wound)     Status: None (Preliminary result)   Collection Time: 04/07/22  6:44 PM   Specimen: Fluid; Abscess  Result Value Ref Range Status   Specimen Description   Final    FLUID Performed at [facility], [address] [place], [place] [address]    Special Requests   Final    NONE Performed at [facility], [address], [place], [place] [address]    Gram Stain   Final    FEW WBC PRESENT, PREDOMINANTLY PMN FEW GRAM NEGATIVE RODS    Culture   Final    TOO YOUNG TO READ Performed at [facility], [address] [place], [place] [address]    Report Status PENDING  Incomplete     Radiology Studies: [place]  Abscess Drain  Result Date: 04/08/2022 INDICATION: Left retroperitoneal abscess extending into the left flank soft tissues EXAM: Ultrasound-guided placement of drain into left retroperitoneal abscess MEDICATIONS: The patient is currently admitted to the hospital and receiving intravenous antibiotics. The antibiotics were administered within an appropriate time frame prior to the initiation of the procedure. ANESTHESIA/SEDATION: Moderate (conscious) sedation was employed during this procedure. A total of Versed 2 mg and Fentanyl 100 mcg was administered intravenously by the radiology nurse. Total intra-service moderate Sedation Time: 17 minutes. The patient's level of consciousness and vital signs were monitored continuously by radiology nursing throughout the procedure under my direct supervision. COMPLICATIONS: None immediate. PROCEDURE: Informed written consent was obtained from the patient after a thorough discussion of the procedural risks, benefits and alternatives. All questions were addressed. Maximal Sterile Barrier Technique was utilized including caps, mask, sterile gowns, sterile gloves, sterile drape, hand hygiene and skin antiseptic. A timeout was performed prior to the initiation of the procedure. The patient was placed prone on the exam table. Ultrasound of the left flank demonstrated complex fluid collection. Skin entry site was marked, and the overlying skin was prepped draped in the standard sterile fashion. Local analgesia was obtained with 1% lidocaine. Using ultrasound guidance, a 12 French locking drainage catheter was advanced into the left retroperitoneal/flank fluid collection using trocar technique. Location within the collection was confirmed with visualization of the pigtail catheter within the collection. There was return of purulent material. A sample was sent to the lab for analysis. The drainage catheter was attached to bulb suction. It was secured to the skin using silk suture and  a dressing. The patient tolerated the procedure well without immediate complication. IMPRESSION: Successful ultrasound-guided placement of a 12 French locking drainage catheter into the left retroperitoneal/flank abscess. A sample was sent to the lab for microbiology analysis. Electronically Signed   By: Olive Bass M.D.   On: 04/08/2022 11:51   CT Abdomen Pelvis W Contrast  Result Date: 04/06/2022 CLINICAL DATA:  Recurrent abscess with communication with the pancreas. Patient states she had a pseudocyst on her pancreas that extended into the left flank with drainage. The patient became septic and was hospitalized for a month. EXAM: CT ABDOMEN AND PELVIS WITH CONTRAST TECHNIQUE: Multidetector CT imaging of the abdomen  and pelvis was performed using the standard protocol following bolus administration of intravenous contrast. RADIATION DOSE REDUCTION: This exam was performed according to the departmental dose-optimization program which includes automated exposure control, adjustment of the mA and/or kV according to patient size and/or use of iterative reconstruction technique. CONTRAST:  122mL OMNIPAQUE IOHEXOL 300 MG/ML  SOLN COMPARISON:  CT scan of the abdomen and pelvis January 22, 2022 and March 26, 2022 FINDINGS: Lower chest: No acute abnormality. Hepatobiliary: The liver demonstrates cirrhotic changes with a nodular contour. No liver mass identified. Portal vein is patent. The patient is status post cholecystectomy. Prominence of the common bile duct and central intrahepatic ducts is likely from previous cholecystectomy and is stable. Pancreas: Pancreatic head, neck, and body are stable. A few calcifications in the pancreatic head are likely due to previous pancreatitis. There is fluid near the pancreatic tail which could be in the talar adjacent. Pancreas is otherwise stable. Spleen: The spleen is prominent but stable. Adrenals/Urinary Tract: Adrenal glands are unremarkable. Kidneys are normal,  without renal calculi, focal lesion, or hydronephrosis. Bladder is unremarkable. Stomach/Bowel: The stomach and small bowel are normal. Moderate fecal loading in the colon. The colon is otherwise unremarkable. The appendix is unremarkable. Vascular/Lymphatic: Calcified atherosclerotic changes are identified in the abdominal aorta, extending into the iliac and femoral vessels. No adenopathy. Splenic varices are noted. Reproductive: Uterus and bilateral adnexa are unremarkable. Other: There is moderate ascites in the abdomen adjacent to the liver and spleen. There is also significant ascites in the pelvis. The amount of ascites is similar since March 26, 2022. There is a large peripherally enhancing complex fluid collection. The collection extends from near the tail of the pancreas posteriorly and inferiorly through the left posterior musculature, X into the subcutaneous fat. The collection extends to the skin surface. Again, the margins are irregular and the collection is difficult to measure. The collection is significantly larger compared to March 26, 2022 1 only a small amount of fluid remain near the pancreatic tail. However, the collection is a little smaller than the January 22, 2022 study. Musculoskeletal: No acute or significant osseous findings. IMPRESSION: 1. There is a large peripherally enhancing complex fluid collection in the left abdomen extending from the tail of the pancreas posteriorly and inferiorly into the subcutaneous fat. The collection is significantly larger compared to March 26, 2022. However, the collection is a little smaller than the January 22, 2022 study. The collection extends to the skin surface. This collection may represent a pseudocyst given previous history. An abscess could have a similar appearance. Whether the collection is sterile or infected cannot be determined on this study. There is no obvious air in the collection. 2. Cirrhosis with evidence of portal venous  hypertension including splenic varices and moderate ascites. 3. Moderate fecal loading in the colon. 4. Calcified atherosclerotic changes in the abdominal aorta, iliac vessels, and femoral vessels. 5. Aortic atherosclerosis. Aortic Atherosclerosis (ICD10-I70.0). Electronically Signed   By: Dorise Bullion III M.D.   On: 04/06/2022 18:29    Scheduled Meds:  vitamin B-12  5,000 mcg Oral Daily   furosemide  20 mg Oral Daily   insulin aspart  0-20 Units Subcutaneous TID PC & HS   loratadine  10 mg Oral Daily   sodium chloride flush  5 mL Intracatheter Q8H   spironolactone  50 mg Oral Daily   traZODone  100 mg Oral QHS   Continuous Infusions:  ampicillin-sulbactam (UNASYN) IV 200 mL/hr at 04/08/22 1211   magnesium  sulfate bolus IVPB      LOS: 2 days   Raiford Noble, DO [facility] Available via Epic secure chat 7am-7pm After these hours, please refer to coverage provider listed on [email or web address] 04/08/2022, 12:37 PM

## 2022-04-08 NOTE — Progress Notes (Addendum)
Subjective/Chief Complaint: No complaints Comfortable Tolerated drain placement 200 cc out of drain   Objective: Vital signs in last 24 hours: Temp:  [97.8 F (36.6 C)-100 F (37.8 C)] 97.8 F (36.6 C) (12/03 0529) Pulse Rate:  [71-111] 71 (12/03 0529) Resp:  [11-20] 18 (12/03 0529) BP: (97-130)/(67-82) 97/69 (12/03 0529) SpO2:  [95 %-100 %] 99 % (12/03 0529) Weight:  [63.9 kg] 63.9 kg (12/02 1520)    Intake/Output from previous day: 12/02 0701 - 12/03 0700 In: 156.5 [IV Piggyback:156.5] Out: 200 [Drains:200] Intake/Output this shift: No intake/output data recorded.  Exam: Awake and alert Abdomen soft, Non-tender, drain serosanguinous   Lab Results:  Recent Labs    04/07/22 0220 04/08/22 0520  WBC 7.7 5.7  HGB 12.1 10.5*  HCT 35.9* 32.2*  PLT 190 172   BMET Recent Labs    04/07/22 0220 04/08/22 0520  NA 130* 131*  K 4.2 3.7  CL 97* 101  CO2 24 20*  GLUCOSE 243* 161*  BUN 35* 31*  CREATININE 0.92 0.95  CALCIUM 9.2 8.5*   PT/INR No results for input(s): "LABPROT", "INR" in the last 72 hours. ABG No results for input(s): "PHART", "HCO3" in the last 72 hours.  Invalid input(s): "PCO2", "PO2"  Studies/Results: CT Abdomen Pelvis W Contrast  Result Date: 04/06/2022 CLINICAL DATA:  Recurrent abscess with communication with the pancreas. Patient states she had a pseudocyst on her pancreas that extended into the left flank with drainage. The patient became septic and was hospitalized for a month. EXAM: CT ABDOMEN AND PELVIS WITH CONTRAST TECHNIQUE: Multidetector CT imaging of the abdomen and pelvis was performed using the standard protocol following bolus administration of intravenous contrast. RADIATION DOSE REDUCTION: This exam was performed according to the departmental dose-optimization program which includes automated exposure control, adjustment of the mA and/or kV according to patient size and/or use of iterative reconstruction technique. CONTRAST:   180mL OMNIPAQUE IOHEXOL 300 MG/ML  SOLN COMPARISON:  CT scan of the abdomen and pelvis January 22, 2022 and March 26, 2022 FINDINGS: Lower chest: No acute abnormality. Hepatobiliary: The liver demonstrates cirrhotic changes with a nodular contour. No liver mass identified. Portal vein is patent. The patient is status post cholecystectomy. Prominence of the common bile duct and central intrahepatic ducts is likely from previous cholecystectomy and is stable. Pancreas: Pancreatic head, neck, and body are stable. A few calcifications in the pancreatic head are likely due to previous pancreatitis. There is fluid near the pancreatic tail which could be in the talar adjacent. Pancreas is otherwise stable. Spleen: The spleen is prominent but stable. Adrenals/Urinary Tract: Adrenal glands are unremarkable. Kidneys are normal, without renal calculi, focal lesion, or hydronephrosis. Bladder is unremarkable. Stomach/Bowel: The stomach and small bowel are normal. Moderate fecal loading in the colon. The colon is otherwise unremarkable. The appendix is unremarkable. Vascular/Lymphatic: Calcified atherosclerotic changes are identified in the abdominal aorta, extending into the iliac and femoral vessels. No adenopathy. Splenic varices are noted. Reproductive: Uterus and bilateral adnexa are unremarkable. Other: There is moderate ascites in the abdomen adjacent to the liver and spleen. There is also significant ascites in the pelvis. The amount of ascites is similar since March 26, 2022. There is a large peripherally enhancing complex fluid collection. The collection extends from near the tail of the pancreas posteriorly and inferiorly through the left posterior musculature, X into the subcutaneous fat. The collection extends to the skin surface. Again, the margins are irregular and the collection is difficult to measure. The  collection is significantly larger compared to March 26, 2022 1 only a small amount of fluid  remain near the pancreatic tail. However, the collection is a little smaller than the January 22, 2022 study. Musculoskeletal: No acute or significant osseous findings. IMPRESSION: 1. There is a large peripherally enhancing complex fluid collection in the left abdomen extending from the tail of the pancreas posteriorly and inferiorly into the subcutaneous fat. The collection is significantly larger compared to March 26, 2022. However, the collection is a little smaller than the January 22, 2022 study. The collection extends to the skin surface. This collection may represent a pseudocyst given previous history. An abscess could have a similar appearance. Whether the collection is sterile or infected cannot be determined on this study. There is no obvious air in the collection. 2. Cirrhosis with evidence of portal venous hypertension including splenic varices and moderate ascites. 3. Moderate fecal loading in the colon. 4. Calcified atherosclerotic changes in the abdominal aorta, iliac vessels, and femoral vessels. 5. Aortic atherosclerosis. Aortic Atherosclerosis (ICD10-I70.0). Electronically Signed   By: Gerome Sam III M.D.   On: 04/06/2022 18:29    Anti-infectives: Anti-infectives (From admission, onward)    Start     Dose/Rate Route Frequency Ordered Stop   04/06/22 2200  Ampicillin-Sulbactam (UNASYN) 3 g in sodium chloride 0.9 % 100 mL IVPB        3 g 200 mL/hr over 30 Minutes Intravenous Every 6 hours 04/06/22 2042     04/06/22 2030  metroNIDAZOLE (FLAGYL) IVPB 500 mg  Status:  Discontinued        500 mg 100 mL/hr over 60 Minutes Intravenous Every 12 hours 04/06/22 2023 04/06/22 2029       Assessment/Plan: Recurrent flank and intra-abdominal abscess versus infected pseudocyst   Cultures on drain pending Will send amylase level on fluid Continue antibiotics Ok for po  Workup ongoing.  Uncertain of next step if this ends up being a pancreatic fistula in light of patient's  cirrhosis  Complex medical decision making   Abigail Miyamoto MD 04/08/2022

## 2022-04-08 NOTE — Consult Note (Incomplete)
[facility]                                                                                       Patient Identification: Patient Name: Mallory Stewart MRN: [id number] Admit Date: 04/06/2022  1:35 PM Today's Date: 04/08/2022 Reason for consult: abtx recs/abscess Requesting provider: Dr Daiva Eves   Principal Problem:   Abscess of flank Active Problems:   Alcoholic cirrhosis of liver with ascites (HCC)   Type 2 diabetes mellitus with hyperglycemia, with long-term current use of insulin (HCC)   Antibiotics:  Unasyn 12/1  Lines/Hardware: Left back JP drain +  Assessment 61 year old female with PMHx below including chronic alcoholic pancreatitis complicated with pseudocyst, alcoholic liver cirrhosis with portal hypertension/gastroparesis, type II DM, hyperlipidemia, hypertension, C. difficile colitis admitted with recurrent LL Flank/intraabdominal abscess vs infected pseudocyst. Surgery following closely. Follows  GI Dr Leone Payor.   12/2 US guided drain placement into retroperitoneal abscess, 12 Fr locking, placed to bulb. Sample sent for micro.  GNR, ON Unasyn   Concerns for pancreatic fistula per General surgery given h/o liver cirrhosis. Checking amylase level. Paracentesis has been ordered by primary.   Recommendations  Continue Unasyn, likely GNR Is Kleb pneumo which she has had for a long time.  FU IR aspirate cx FU surgical plans, paracentesis  Source control seems to have been difficult given complexity and  leading to recurrence PO Vancomycin for C diff prophylaxis  Dr Drue Second to resume care starting Monday   Rest of the management as per the primary team. Please call with questions or concerns.  Thank you for the consult  Odette Fraction, MD Infectious Disease Physician [facility] [address] [place], [place]  [address] Phone: [phone]  Fax: [phone]  __________________________________________________________________________________________________________ HPI and Hospital Course: 61 year old female with PMHx below including chronic alcoholic pancreatitis complicated with pseudocyst, alcoholic liver cirrhosis with portal hypertension/gastroparesis, type II DM, hyperlipidemia, hypertension, C. difficile colitis who presented to the ED on 12/1 with new redness, swelling for 2 days at previous surgical site left mid thoracic region.  Denies any abdominal pain, nausea vomiting or diarrhea.  Denies any GU symptoms  She was recently hospitalized in September secondary to sepsis from cellulitis of the left flank with large abscess with left posterior pararenal space with extension to posterior left abdominal wall. 9/19 s/p US guided aspiration of left posterior abdominal wall collection. Cx with Klebsiella pneumoniae. s/p I and D of back abscess 9/21. OR cx  Findings: abscess with connection to abdominal cavity. OR cx Kleb pneumo. MRCP 9/21 Near-complete resolution of left abdominal pancreatic pseudocyst. Residual complex fluid collection in the left posterior abdominal wall soft tissues measures 5.3 x 3.0 cm. Hepatic cirrhosis and portal venous hypertension resulting in splenomegaly and gastroesophageal varices. No evidence of hepatic neoplasm. Large amount of ascites, with diffuse mesenteric and body wall edema. Diffuse biliary ductal dilatation with stricture within the pancreatic head. No radiographic signs of pancreatic neoplasm. ERCP on 9/23 to rule out pancreatic duct leak where it was found a possible pancreatic duct stricture and is status post pancreatic sphincterotomy  and pancreatic duct stent placement. She was initially on IV Unasyn which was switched to p.o. Augmentin for 4 weeks per ID.  She was also on oral vancomycin for C. difficile prophylaxis.  Paracentesis 10/20 NG.  Seen by Dr Daiva Eves on 03/19/22  where she was working to have CT done. She was continued on augmentin and po Vancomycin prophyalctically. CT 11/21 complete resolution of abscess in retroperitoneal abscess. Seen by Dr Leone Payor 12/1 and sent her to ED for concerns of abscess coming back   At ED, febrile T max 100.4.  Labs are remarkable for WBC 6.4  12/1 US guided drain placement into retroperitoneal abscess, 12 Fr locking, placed to bulb. Sample sent for micro  Cx GNR   12/1 CT abdomen/pelvis  IMPRESSION: 1. There is a large peripherally enhancing complex fluid collection in the left abdomen extending from the tail of the pancreas posteriorly and inferiorly into the subcutaneous fat. The collection is significantly larger compared to March 26, 2022. However, the collection is a little smaller than the January 22, 2022 study. The collection extends to the skin surface. This collection may represent a pseudocyst given previous history. An abscess could have a similar appearance. Whether the collection is sterile or infected cannot be determined on this study. There is no obvious air in the collection. 2. Cirrhosis with evidence of portal venous hypertension including splenic varices and moderate ascites. 3. Moderate fecal loading in the colon. 4. Calcified atherosclerotic changes in the abdominal aorta, iliac vessels, and femoral vessels. 5. Aortic atherosclerosis.   ROS: General- Denies fever, chills, loss of appetite and loss of weight HEENT - Denies headache, blurry vision, neck pain, sinus pain Chest - Denies any chest pain, SOB or cough CVS- Denies any dizziness/lightheadedness, syncopal attacks, palpitations Abdomen- Denies any nausea, vomiting, abdominal pain, hematochezia and diarrhea Neuro - Denies any weakness, numbness, tingling sensation Psych - Denies any changes in mood irritability or depressive symptoms GU- Denies any burning, dysuria, hematuria or increased frequency of urination Skin - denies  any rashes/lesions MSK - denies any joint pain/swelling or restricted ROM   Past Medical History:  Diagnosis Date   Alcohol-induced chronic pancreatitis (HCC)    Alcoholic cirrhosis (HCC)    B12 deficiency    Back abscess    C. difficile colitis    Concussion    DDD (degenerative disc disease), cervical    Diabetes (HCC)    DKA, type 2 (HCC)    Gastric outlet obstruction 08/12/2018   Hypertension    Neuropathy    Pancreatic pseudocyst/cyst 05/06/2013   Seasonal allergies    Past Surgical History:  Procedure Laterality Date   ANKLE ARTHROSCOPY Right 12/20/2020   Procedure: RIGHT ANKLE ARTHROSCOPIC DEBRIDEMENT;  Surgeon: Nadara Mustard, MD;  Location: [facility];  Service: Orthopedics;  Laterality: Right;   BILIARY BRUSHING  01/27/2022   Procedure: BILIARY BRUSHING;  Surgeon: Meridee Score Netty Starring., MD;  Location: [facility];  Service: Gastroenterology;;   BIOPSY  01/19/2019   Procedure: BIOPSY;  Surgeon: Lemar Lofty., MD;  Location[facility];  Service: Gastroenterology;;   CHOLECYSTECTOMY N/A 07/28/2016   Procedure: LAPAROSCOPIC CHOLECYSTECTOMY WITH INTRAOPERATIVE CHOLANGIOGRAM;  Surgeon: Rodman Pickle, MD;  Location[facility] OR;  Service: General;  Laterality: N/A;   ERCP N/A 01/27/2022   Procedure: ENDOSCOPIC RETROGRADE CHOLANGIOPANCREATOGRAPHY (ERCP);  Surgeon: Lemar Lofty., MD;  Location: [facility];  Service: Gastroenterology;  Laterality: N/A;   ESOPHAGOGASTRODUODENOSCOPY (EGD) WITH PROPOFOL N/A 08/13/2018   Procedure: ESOPHAGOGASTRODUODENOSCOPY (EGD)  WITH PROPOFOL;  Surgeon: Mansouraty, Netty Starring., MD;  Location: [facility];  Service: Gastroenterology;  Laterality: N/A;   ESOPHAGOGASTRODUODENOSCOPY (EGD) WITH PROPOFOL N/A 01/19/2019   Procedure: ESOPHAGOGASTRODUODENOSCOPY (EGD) WITH PROPOFOL;  Surgeon: Meridee Score Netty Starring., MD;  Location: [facility];  Service: Gastroenterology;  Laterality: N/A;   ESOPHAGOGASTRODUODENOSCOPY  (EGD) WITH PROPOFOL N/A 12/28/2021   Procedure: ESOPHAGOGASTRODUODENOSCOPY (EGD) WITH PROPOFOL;  Surgeon: Meridee Score Netty Starring., MD;  Location: [facility] ENDOSCOPY;  Service: Gastroenterology;  Laterality: N/A;   EUS N/A 12/28/2021   Procedure: UPPER ENDOSCOPIC ULTRASOUND (EUS) LINEAR;  Surgeon: Lemar Lofty., MD;  Location: [facility];  Service: Gastroenterology;  Laterality: N/A;   INCISION AND DRAINAGE ABSCESS N/A 01/25/2022   Procedure: INCISION AND DRAINAGE BACK ABSCESS;  Surgeon: Sheliah Hatch, De Blanch, MD;  Location: [facility] ORS;  Service: General;  Laterality: N/A;  90 L DOW   IR PARACENTESIS  08/08/2020   IR PARACENTESIS  03/13/2021   IR PARACENTESIS  02/23/2022   IR US GUIDE BX ASP/DRAIN  01/23/2022   LEEP  1990's   ORIF FOOT FRACTURE  09/2008   L 5th metatarsal   PANCREATIC STENT PLACEMENT  01/27/2022   Procedure: PANCREATIC STENT PLACEMENT;  Surgeon: Lemar Lofty., MD;  Location: [facility];  Service: Gastroenterology;;   REFRACTIVE SURGERY  2000   REMOVAL OF STONES  01/27/2022   Procedure: REMOVAL OF STONES;  Surgeon: Lemar Lofty., MD;  Location: [facility];  Service: Gastroenterology;;   [name]  01/27/2022   Procedure: [name];  Surgeon: Lemar Lofty., MD;  Location: [facility];  Service: Gastroenterology;;   TIBIA IM NAIL INSERTION Left 03/12/2021   Procedure: INTRAMEDULLARY (IM) NAIL TIBIAL;  Surgeon: Kerrin Champagne, MD;  Location: [facility] OR;  Service: Orthopedics;  Laterality: Left;   TONSILLECTOMY     UPPER ESOPHAGEAL ENDOSCOPIC ULTRASOUND (EUS) N/A 08/13/2018   Procedure: UPPER ESOPHAGEAL ENDOSCOPIC ULTRASOUND (EUS);  Surgeon: Lemar Lofty., MD;  Location: [facility] ENDOSCOPY;  Service: Gastroenterology;  Laterality: N/A;   UPPER ESOPHAGEAL ENDOSCOPIC ULTRASOUND (EUS) N/A 01/19/2019   Procedure: UPPER ESOPHAGEAL ENDOSCOPIC ULTRASOUND (EUS);  Surgeon: Lemar Lofty., MD;  Location: [facility] ENDOSCOPY;  Service: Gastroenterology;   Laterality: N/A;     Scheduled Meds:  vitamin B-12  5,000 mcg Oral Daily   furosemide  20 mg Oral Daily   insulin aspart  0-20 Units Subcutaneous TID PC & HS   loratadine  10 mg Oral Daily   sodium chloride flush  5 mL Intracatheter Q8H   spironolactone  50 mg Oral Daily   traZODone  100 mg Oral QHS   Continuous Infusions:  ampicillin-sulbactam (UNASYN) IV 3 g (04/08/22 0456)   PRN Meds:.ALPRAZolam, morphine injection  Allergies  Allergen Reactions   Ceftriaxone Hives    Immediate bright red itchy rash on bilateral legs after starting infusion.     Gluten Meal Other (See Comments)    Stomach distress   Zocor [Simvastatin] Other (See Comments)    Elevated lfts?   Family History  Problem Relation Age of Onset   Other Mother        tachycardia.Marland KitchenMarland Kitchen?afib   Stroke Father        after hernia suegery   Prostate cancer Father    Other Father        global transient amnesia, unclear source   Atrial fibrillation Sister    Healthy Brother    Healthy Brother    Coronary artery disease Paternal Grandmother    Heart attack Paternal Grandmother 3   Brain  cancer Maternal Grandfather        ?   Cancer Paternal Grandfather        ?   Breast cancer Maternal Grandmother     Vitals BP 97/69 (BP Location: Left Arm)   Pulse 71   Temp 97.8 F (36.6 C) (Oral)   Resp 18   Ht 5\' 8"  (1.727 m)   Wt 63.9 kg   SpO2 99%   BMI 21.42 kg/m     Physical Exam Constitutional:  Middle age caucasian female sitting up in the bed, appears comfortable     Comments:   Cardiovascular:     Rate and Rhythm: Normal rate and regular rhythm.     Heart sounds: Normal heart sounds   Pulmonary:     Effort: Pulmonary effort is normal on room air    Comments: Normal breath sounds   Abdominal:     Palpations: Abdomen is soft.     Tenderness: non distended and non tender. LL back with JP drain draining seosanguinous fluid. No surrounding cellulitis or fluctuance  Musculoskeletal:   No signs of  septic peripheral joints  Neurological:     General: No focal deficit present. Awake, alert and oriented   Psychiatric:        Mood and Affect: Mood normal.    Pertinent Microbiology Results for orders placed or performed during the hospital encounter of 04/06/22  Aerobic/Anaerobic Culture w Gram Stain (surgical/deep wound)     Status: None (Preliminary result)   Collection Time: 04/07/22  6:44 PM   Specimen: Fluid; Abscess  Result Value Ref Range Status   Specimen Description   Final    FLUID Performed at [facility], [address] [place], [place] [address]    Special Requests   Final    NONE Performed at [facility], [address] [place], [place] [address]    Gram Stain   Final    FEW WBC PRESENT, PREDOMINANTLY PMN FEW GRAM NEGATIVE RODS Performed at [facility], [address] [place], [place] [address]    Culture PENDING  Incomplete   Report Status PENDING  Incomplete   *Note: Due to a large number of results and/or encounters for the requested time period, some results have not been displayed. A complete set of results can be found in Results Review.     Pertinent Lab seen by me:    Latest Ref Rng & Units 04/08/2022    5:20 AM 04/07/2022    2:20 AM 04/06/2022    2:38 PM  CBC  WBC 4.0 - 10.5 K/uL 5.7  7.7  6.4   Hemoglobin 12.0 - 15.0 g/dL 91.410.5  78.212.1  95.612.7   Hematocrit 36.0 - 46.0 % 32.2  35.9  39.0   Platelets 150 - 400 K/uL 172  190  167       Latest Ref Rng & Units 04/08/2022    5:20 AM 04/07/2022    2:20 AM 04/06/2022    2:38 PM  CMP  Glucose 70 - 99 mg/dL 213161  086243  [id number]   BUN 6 - 20 mg/dL 31  35  35   Creatinine 0.44 - 1.00 mg/dL 4.690.95  6.290.92  5.280.93   Sodium 135 - 145 mmol/L 131  130  133   Potassium 3.5 - 5.1 mmol/L 3.7  4.2  3.7   Chloride 98 - 111 mmol/L 101  97  97   CO2 22 - 32 mmol/L 20  24  22   Calcium 8.9 - 10.3 mg/dL 8.5  9.2  9.6   Total Protein 6.5 - 8.1 g/dL 6.7   8.3   Total Bilirubin 0.3  - 1.2 mg/dL 0.8   1.0   Alkaline Phos 38 - 126 U/L 290   299   AST 15 - 41 U/L 23   27   ALT 0 - 44 U/L 20   24     Pertinent Imagings/Other Imagings Plain films and CT images have been personally visualized and interpreted; radiology reports have been reviewed. Decision making incorporated into the Impression / Recommendations.  CT Abdomen Pelvis W Contrast  Result Date: 04/06/2022 CLINICAL DATA:  Recurrent abscess with communication with the pancreas. Patient states she had a pseudocyst on her pancreas that extended into the left flank with drainage. The patient became septic and was hospitalized for a month. EXAM: CT ABDOMEN AND PELVIS WITH CONTRAST TECHNIQUE: Multidetector CT imaging of the abdomen and pelvis was performed using the standard protocol following bolus administration of intravenous contrast. RADIATION DOSE REDUCTION: This exam was performed according to the departmental dose-optimization program which includes automated exposure control, adjustment of the mA and/or kV according to patient size and/or use of iterative reconstruction technique. CONTRAST:  OMNIPAQUE IOHEXOL 300 MG/ML  SOLN COMPARISON:  CT scan of the abdomen and pelvis January 22, 2022 and March 26, 2022 FINDINGS: Lower chest: No acute abnormality. Hepatobiliary: The liver demonstrates cirrhotic changes with a nodular contour. No liver mass identified. Portal vein is patent. The patient is status post cholecystectomy. Prominence of the common bile duct and central intrahepatic ducts is likely from previous cholecystectomy and is stable. Pancreas: Pancreatic head, neck, and body are stable. A few calcifications in the pancreatic head are likely due to previous pancreatitis. There is fluid near the pancreatic tail which could be in the talar adjacent. Pancreas is otherwise stable. Spleen: The spleen is prominent but stable. Adrenals/Urinary Tract: Adrenal glands are unremarkable. Kidneys are normal, without renal  calculi, focal lesion, or hydronephrosis. Bladder is unremarkable. Stomach/Bowel: The stomach and small bowel are normal. Moderate fecal loading in the colon. The colon is otherwise unremarkable. The appendix is unremarkable. Vascular/Lymphatic: Calcified atherosclerotic changes are identified in the abdominal aorta, extending into the iliac and femoral vessels. No adenopathy. Splenic varices are noted. Reproductive: Uterus and bilateral adnexa are unremarkable. Other: There is moderate ascites in the abdomen adjacent to the liver and spleen. There is also significant ascites in the pelvis. The amount of ascites is similar since March 26, 2022. There is a large peripherally enhancing complex fluid collection. The collection extends from near the tail of the pancreas posteriorly and inferiorly through the left posterior musculature, X into the subcutaneous fat. The collection extends to the skin surface. Again, the margins are irregular and the collection is difficult to measure. The collection is significantly larger compared to March 26, 2022 1 only a small amount of fluid remain near the pancreatic tail. However, the collection is a little smaller than the January 22, 2022 study. Musculoskeletal: No acute or significant osseous findings. IMPRESSION: 1. There is a large peripherally enhancing complex fluid collection in the left abdomen extending from the tail of the pancreas posteriorly and inferiorly into the subcutaneous fat. The collection is significantly larger compared to March 26, 2022. However, the collection is a little smaller than the January 22, 2022 study. The collection extends to the skin surface. This collection may represent a pseudocyst given previous history. An  abscess could have a similar appearance. Whether the collection is sterile or infected cannot be determined on this study. There is no obvious air in the collection. 2. Cirrhosis with evidence of portal venous hypertension  including splenic varices and moderate ascites. 3. Moderate fecal loading in the colon. 4. Calcified atherosclerotic changes in the abdominal aorta, iliac vessels, and femoral vessels. 5. Aortic atherosclerosis. Aortic Atherosclerosis (ICD10-I70.0). Electronically Signed   By: Gerome Sam III M.D.   On: 04/06/2022 18:29   CT Abdomen Pelvis W Contrast  Result Date: 03/27/2022 CLINICAL DATA:  Pancreatic pseudocyst.  Skip down down 02/21/2022 EXAM: CT ABDOMEN AND PELVIS WITH CONTRAST TECHNIQUE: Multidetector CT imaging of the abdomen and pelvis was performed using the standard protocol following bolus administration of intravenous contrast. RADIATION DOSE REDUCTION: This exam was performed according to the departmental dose-optimization program which includes automated exposure control, adjustment of the mA and/or kV according to patient size and/or use of iterative reconstruction technique. CONTRAST:  OMNIPAQUE IOHEXOL 300 MG/ML  SOLN COMPARISON:  01/22/2022 FINDINGS: Lower chest: Unremarkable. Hepatobiliary: Nodular liver contour with heterogeneous enhancement. No discrete or focal mass lesion evident. Gallbladder is surgically absent. Stable prominence of the extrahepatic bile ducts. Pancreas: No main duct dilatation. Cystic lesion in the region of the pancreatic tail is stable at 1.6 cm maximum dimension (32/3). Spleen: No splenomegaly. No focal mass lesion. Adrenals/Urinary Tract: No adrenal nodule or mass. Kidneys unremarkable. No evidence for hydroureter. The urinary bladder appears normal for the degree of distention. Stomach/Bowel: Stomach is distended with food, fluid, and contrast material. Duodenum is normally positioned as is the ligament of Treitz. No small bowel wall thickening. No small bowel dilatation. The terminal ileum is normal. The appendix is normal. No gross colonic mass. No colonic wall thickening. Vascular/Lymphatic: There is moderate atherosclerotic calcification of the  abdominal aorta without aneurysm. There is no gastrohepatic or hepatoduodenal ligament lymphadenopathy. No retroperitoneal or mesenteric lymphadenopathy. Portal vein, superior mesenteric vein, and splenic vein are patent. Subtle paraesophageal varices evident. No pelvic sidewall lymphadenopathy. Reproductive: The uterus is unremarkable.  There is no adnexal mass. Other: Moderate to large volume ascites identified in the abdomen and pelvis, similar to prior. Musculoskeletal: The complex rim enhancing fluid collections seen in left retroperitoneal space extending into the left flank region has essentially resolved in the interval. There is no residual rim enhancing fluid collection at this time although fat planes in the abdominal wall the left flank region remain obscured with some fluid or granulation in the anterior pararenal space. No worrisome lytic or sclerotic osseous abnormality. IMPRESSION: 1. The complex rim enhancing fluid collections seen in the left retroperitoneal space extending into the left flank region have essentially resolved in the interval. There is no residual rim enhancing fluid collection at this time although fat planes in the abdominal wall of the left flank region remain obscured with some edema or granulation in the anterior pararenal space. 2. Nodular liver contour with heterogeneous enhancement. Imaging features compatible with cirrhosis. No discrete or focal mass lesion evident. Subtle paraesophageal varices suggest portal venous hypertension. 3. Moderate to large volume ascites in the abdomen and pelvis, similar to prior. 4. Stable 1.6 cm cystic lesion in the region of the pancreatic tail. 5.  Aortic Atherosclerosis (ICD10-I70.0). Electronically Signed   By: Kennith Center M.D.   On: 03/27/2022 10:06    I spent 100 minutes for this patient encounter including review of prior medical records/discussing diagnostics and treatment plan with the patient/family/coordinate care with  primary/other specialits with greater than 50% of time in face to face encounter.   Electronically signed by:   Rosiland Oz, MD Infectious Disease Physician [facility] Pager: [phone]

## 2022-04-09 ENCOUNTER — Inpatient Hospital Stay (HOSPITAL_COMMUNITY): Payer: 59

## 2022-04-09 DIAGNOSIS — K7031 Alcoholic cirrhosis of liver with ascites: Secondary | ICD-10-CM | POA: Diagnosis not present

## 2022-04-09 DIAGNOSIS — L02211 Cutaneous abscess of abdominal wall: Secondary | ICD-10-CM | POA: Diagnosis not present

## 2022-04-09 DIAGNOSIS — K651 Peritoneal abscess: Secondary | ICD-10-CM

## 2022-04-09 DIAGNOSIS — E1165 Type 2 diabetes mellitus with hyperglycemia: Secondary | ICD-10-CM | POA: Diagnosis not present

## 2022-04-09 DIAGNOSIS — D649 Anemia, unspecified: Secondary | ICD-10-CM | POA: Diagnosis not present

## 2022-04-09 LAB — LACTATE DEHYDROGENASE, PLEURAL OR PERITONEAL FLUID: LD, Fluid: 61 U/L — ABNORMAL HIGH (ref 3–23)

## 2022-04-09 LAB — ALBUMIN, PLEURAL OR PERITONEAL FLUID: Albumin, Fluid: 1.7 g/dL

## 2022-04-09 LAB — CBC WITH DIFFERENTIAL/PLATELET
Abs Immature Granulocytes: 0.03 10*3/uL (ref 0.00–0.07)
Basophils Absolute: 0 10*3/uL (ref 0.0–0.1)
Basophils Relative: 1 %
Eosinophils Absolute: 0.1 10*3/uL (ref 0.0–0.5)
Eosinophils Relative: 2 %
HCT: 36 % (ref 36.0–46.0)
Hemoglobin: 11.8 g/dL — ABNORMAL LOW (ref 12.0–15.0)
Immature Granulocytes: 1 %
Lymphocytes Relative: 25 %
Lymphs Abs: 1.2 10*3/uL (ref 0.7–4.0)
MCH: 27.9 pg (ref 26.0–34.0)
MCHC: 32.8 g/dL (ref 30.0–36.0)
MCV: 85.1 fL (ref 80.0–100.0)
Monocytes Absolute: 0.4 10*3/uL (ref 0.1–1.0)
Monocytes Relative: 8 %
Neutro Abs: 2.9 10*3/uL (ref 1.7–7.7)
Neutrophils Relative %: 63 %
Platelets: 205 10*3/uL (ref 150–400)
RBC: 4.23 MIL/uL (ref 3.87–5.11)
RDW: 13.3 % (ref 11.5–15.5)
WBC: 4.6 10*3/uL (ref 4.0–10.5)
nRBC: 0 % (ref 0.0–0.2)

## 2022-04-09 LAB — IRON AND TIBC
Iron: 43 ug/dL (ref 28–170)
Saturation Ratios: 14 % (ref 10.4–31.8)
TIBC: 307 ug/dL (ref 250–450)
UIBC: 264 ug/dL

## 2022-04-09 LAB — COMPREHENSIVE METABOLIC PANEL
ALT: 17 U/L (ref 0–44)
AST: 23 U/L (ref 15–41)
Albumin: 3.3 g/dL — ABNORMAL LOW (ref 3.5–5.0)
Alkaline Phosphatase: 279 U/L — ABNORMAL HIGH (ref 38–126)
Anion gap: 9 (ref 5–15)
BUN: 36 mg/dL — ABNORMAL HIGH (ref 6–20)
CO2: 25 mmol/L (ref 22–32)
Calcium: 9.3 mg/dL (ref 8.9–10.3)
Chloride: 100 mmol/L (ref 98–111)
Creatinine, Ser: 0.95 mg/dL (ref 0.44–1.00)
GFR, Estimated: >60 mL/min (ref >60)
Glucose, Bld: 137 mg/dL — ABNORMAL HIGH (ref 70–99)
Potassium: 4 mmol/L (ref 3.5–5.1)
Sodium: 134 mmol/L — ABNORMAL LOW (ref 135–145)
Total Bilirubin: 0.6 mg/dL (ref 0.3–1.2)
Total Protein: 7.2 g/dL (ref 6.5–8.1)

## 2022-04-09 LAB — GRAM STAIN

## 2022-04-09 LAB — GLUCOSE, PLEURAL OR PERITONEAL FLUID: Glucose, Fluid: 217 mg/dL

## 2022-04-09 LAB — RETICULOCYTES
Immature Retic Fract: 11.5 % (ref 2.3–15.9)
RBC.: 4.14 MIL/uL (ref 3.87–5.11)
Retic Count, Absolute: 46.8 10*3/uL (ref 19.0–186.0)
Retic Ct Pct: 1.1 % (ref 0.4–3.1)

## 2022-04-09 LAB — VITAMIN B12: Vitamin B-12: 3428 pg/mL — ABNORMAL HIGH (ref 180–914)

## 2022-04-09 LAB — FERRITIN: Ferritin: 98 ng/mL (ref 11–307)

## 2022-04-09 LAB — FOLATE: Folate: 4.8 ng/mL — ABNORMAL LOW (ref >5.9)

## 2022-04-09 LAB — GLUCOSE, CAPILLARY
Glucose-Capillary: 156 mg/dL — ABNORMAL HIGH (ref 70–99)
Glucose-Capillary: 216 mg/dL — ABNORMAL HIGH (ref 70–99)
Glucose-Capillary: 197 mg/dL — ABNORMAL HIGH (ref 70–99)
Glucose-Capillary: 217 mg/dL — ABNORMAL HIGH (ref 70–99)

## 2022-04-09 LAB — AMYLASE: Amylase: 29 U/L (ref 28–100)

## 2022-04-09 LAB — MAGNESIUM: Magnesium: 2.2 mg/dL (ref 1.7–2.4)

## 2022-04-09 LAB — PHOSPHORUS: Phosphorus: 4.3 mg/dL (ref 2.5–4.6)

## 2022-04-09 MED ORDER — VANCOMYCIN HCL 125 MG PO CAPS
125.0000 mg | ORAL_CAPSULE | Freq: Two times a day (BID) | ORAL | Status: DC
  Administered 2022-04-09 – 2022-04-10 (×2): 125 mg via ORAL
  Filled 2022-04-09 (×2): qty 1

## 2022-04-09 MED ORDER — FOLIC ACID 1 MG PO TABS
1.0000 mg | ORAL_TABLET | Freq: Every day | ORAL | Status: DC
  Administered 2022-04-09 – 2022-04-10 (×2): 1 mg via ORAL
  Filled 2022-04-09 (×2): qty 1

## 2022-04-09 MED ORDER — SACCHAROMYCES BOULARDII 250 MG PO CAPS
250.0000 mg | ORAL_CAPSULE | Freq: Two times a day (BID) | ORAL | Status: DC
  Administered 2022-04-09: 250 mg via ORAL
  Filled 2022-04-09: qty 1

## 2022-04-09 MED ORDER — SODIUM CHLORIDE FLUSH 0.9 % IV SOLN: 5.0000 mL | Freq: Every day | INTRAVENOUS | 1 refills | Status: AC

## 2022-04-09 MED ORDER — LIDOCAINE HCL 1 % IJ SOLN
INTRAMUSCULAR | Status: AC
  Administered 2022-04-09: 15 mL
  Filled 2022-04-09: qty 20

## 2022-04-09 NOTE — Progress Notes (Signed)
  Transition of Care [facility]) Screening Note   Patient Details  Name: Mallory Stewart Date of Birth: 26-Feb-1961   Transition of Care [facility]) CM/SW Contact:    Otelia Santee, LCSW Phone Number: 04/09/2022, 12:27 PM    Transition of Care Department [facility]) has reviewed patient and no TOC needs have been identified at this time. We will continue to monitor patient advancement through interdisciplinary progression rounds. If new patient transition needs arise, please place a TOC consult.

## 2022-04-09 NOTE — Progress Notes (Signed)
Supervising Physician: Ruthann Cancer  Patient Status:  [facility] - In-pt  Chief Complaint:  Seen in follow up of left flank abscess drain placement in IR 04/07/22  Subjective:  Patient sitting up in bed, chatting on phone. No current complaints. Asking for drain care instructions for upcoming discharge.  Feeling confident about going home.  Allergies: Ceftriaxone, Gluten meal, and Zocor [simvastatin]  Medications: Prior to Admission medications   Medication Sig Start Date End Date Taking? Authorizing Provider  ALPRAZolam (XANAX) 0.25 MG tablet Take 1 tablet (0.25 mg total) by mouth 2 (two) times daily as needed for anxiety. Please ignore other prescription as change made. Patient taking differently: Take 0.25 mg by mouth every 12 (twelve) hours as needed for anxiety. 04/05/22  Yes Bedsole, Amy E, MD  CALCIUM PO Take 1 tablet by mouth in the morning and at bedtime.   Yes [provider]  cetirizine (ZYRTEC) 10 MG tablet Take 10 mg by mouth at bedtime.   Yes [provider]  Cholecalciferol (VITAMIN D3 PO) Take 2 capsules by mouth in the morning and at bedtime.   Yes [provider]  Cyanocobalamin (VITAMIN B-12) 5000 MCG SUBL Place 5,000 mcg under the tongue daily.   Yes [provider]  furosemide (LASIX) 20 MG tablet Take 1 tablet (20 mg total) by mouth daily. Patient taking differently: Take 20 mg by mouth in the morning. 03/15/22  Yes Gatha Mayer, MD  Insulin Glargine [facility] KWIKPEN) 100 UNIT/ML Inject 30 Units into the skin daily. 04/06/22  Yes Shamleffer, Melanie Crazier, MD  insulin lispro (HUMALOG KWIKPEN) 100 UNIT/ML KwikPen Max daily 30 units Patient taking differently: 6 Units See admin instructions. Inject 6 units into the skin three times a day with meals, PER SLIDING SCALE 04/06/22  Yes Shamleffer, Melanie Crazier, MD  spironolactone (ALDACTONE) 50 MG tablet Take 1 tablet (50 mg total) by mouth daily. 03/15/22  Yes Gatha Mayer, MD   traZODone (DESYREL) 100 MG tablet Take 0.5-1 tablets (50-100 mg total) by mouth at bedtime. Patient taking differently: Take 100 mg by mouth at bedtime. 03/26/22  Yes Copland, Frederico Hamman, MD  amoxicillin-clavulanate (AUGMENTIN) 875-125 MG tablet Take 1 tablet by mouth 2 (two) times daily. Patient not taking: Reported on 04/06/2022 02/22/22   Tommy Medal, Lavell Islam, MD  Blood Glucose Monitoring Suppl (ONE TOUCH ULTRA 2) w/Device KIT Use to check blood sugars two times day 08/01/18   Bedsole, Amy E, MD  glucose blood (ONETOUCH ULTRA) test strip CHECK BLOOD SUGAR TWICE DAILY AS DIRECTED 12/13/21   Bedsole, Amy E, MD  Insulin Pen Needle 32G X 4 MM MISC 1 Device by Does not apply route in the morning, at noon, in the evening, and at bedtime. 04/06/22   Shamleffer, Melanie Crazier, MD  Lancets [facility] ULTRASOFT) lancets Use to check blood sugar two times a day. 08/01/18   Bedsole, Amy E, MD  vancomycin (VANCOCIN) 125 MG capsule Take 1 capsule (125 mg total) by mouth daily. Patient not taking: Reported on 04/06/2022 02/22/22   Tommy Medal, Lavell Islam, MD     Vital Signs: BP 104/81 (BP Location: Right Arm)   Pulse 77   Temp 98 F (36.7 C)   Resp 19   Ht _0  (1.727 m)   Wt 140 lb 14 oz (63.9 kg)   SpO2 100%   BMI 21.42 kg/m   Physical Exam Constitutional:      General: She is not in acute distress. HENT:  Head: Normocephalic and atraumatic.     Mouth/Throat:     Pharynx: Oropharynx is clear.  Eyes:     Extraocular Movements: Extraocular movements intact.  Cardiovascular:     Rate and Rhythm: Normal rate.  Pulmonary:     Effort: Pulmonary effort is normal. No respiratory distress.  Abdominal:     Palpations: Abdomen is soft.     Tenderness: There is no abdominal tenderness. There is no guarding.  Skin:    General: Skin is warm and dry.  Neurological:     General: No focal deficit present.     Mental Status: She is alert and oriented to person, place, and time.  Psychiatric:        Mood  and Affect: Mood normal.        Behavior: Behavior normal.   Drain Location: CVA left Size: Fr size: 12 Fr Date of placement: 04/07/22 Currently to: Drain collection device: suction bulb 24 hour output:  Output by Drain (mL) 04/07/22 0701 - 04/07/22 1900 04/07/22 1901 - 04/08/22 0700 04/08/22 0701 - 04/08/22 1900 04/08/22 1901 - 04/09/22 0700 04/09/22 0701 - 04/09/22 1147  Closed System Drain Inferior;Left Back Bulb (JP) 12 Fr. 100 100  20     Current examination: Flushes/aspirates easily.  Insertion site unremarkable. Suture and stat lock in place. Dressed appropriately.  Approximately 10cc serosanguinous output in bulb  Imaging: [place] Abscess Drain  Result Date: 04/08/2022 INDICATION: Left retroperitoneal abscess extending into the left flank soft tissues EXAM: Ultrasound-guided placement of drain into left retroperitoneal abscess MEDICATIONS: The patient is currently admitted to the hospital and receiving intravenous antibiotics. The antibiotics were administered within an appropriate time frame prior to the initiation of the procedure. ANESTHESIA/SEDATION: Moderate (conscious) sedation was employed during this procedure. A total of Versed 2 mg and Fentanyl 100 mcg was administered intravenously by the radiology nurse. Total intra-service moderate Sedation Time: 17 minutes. The patient's level of consciousness and vital signs were monitored continuously by radiology nursing throughout the procedure under my direct supervision. COMPLICATIONS: None immediate. PROCEDURE: Informed written consent was obtained from the patient after a thorough discussion of the procedural risks, benefits and alternatives. All questions were addressed. Maximal Sterile Barrier Technique was utilized including caps, mask, sterile gowns, sterile gloves, sterile drape, hand hygiene and skin antiseptic. A timeout was performed prior to the initiation of the procedure. The patient was placed prone on the exam table.  Ultrasound of the left flank demonstrated complex fluid collection. Skin entry site was marked, and the overlying skin was prepped draped in the standard sterile fashion. Local analgesia was obtained with 1% lidocaine. Using ultrasound guidance, a 12 French locking drainage catheter was advanced into the left retroperitoneal/flank fluid collection using trocar technique. Location within the collection was confirmed with visualization of the pigtail catheter within the collection. There was return of purulent material. A sample was sent to the lab for analysis. The drainage catheter was attached to bulb suction. It was secured to the skin using silk suture and a dressing. The patient tolerated the procedure well without immediate complication. IMPRESSION: Successful ultrasound-guided placement of a 12 French locking drainage catheter into the left retroperitoneal/flank abscess. A sample was sent to the lab for microbiology analysis. Electronically Signed   By: Albin Felling M.D.   On: 04/08/2022 11:51   CT Abdomen Pelvis W Contrast  Result Date: 04/06/2022 CLINICAL DATA:  Recurrent abscess with communication with the pancreas. Patient states she had a pseudocyst on her pancreas that extended  into the left flank with drainage. The patient became septic and was hospitalized for a month. EXAM: CT ABDOMEN AND PELVIS WITH CONTRAST TECHNIQUE: Multidetector CT imaging of the abdomen and pelvis was performed using the standard protocol following bolus administration of intravenous contrast. RADIATION DOSE REDUCTION: This exam was performed according to the departmental dose-optimization program which includes automated exposure control, adjustment of the mA and/or kV according to patient size and/or use of iterative reconstruction technique. CONTRAST:  167m OMNIPAQUE IOHEXOL 300 MG/ML  SOLN COMPARISON:  CT scan of the abdomen and pelvis January 22, 2022 and March 26, 2022 FINDINGS: Lower chest: No acute  abnormality. Hepatobiliary: The liver demonstrates cirrhotic changes with a nodular contour. No liver mass identified. Portal vein is patent. The patient is status post cholecystectomy. Prominence of the common bile duct and central intrahepatic ducts is likely from previous cholecystectomy and is stable. Pancreas: Pancreatic head, neck, and body are stable. A few calcifications in the pancreatic head are likely due to previous pancreatitis. There is fluid near the pancreatic tail which could be in the talar adjacent. Pancreas is otherwise stable. Spleen: The spleen is prominent but stable. Adrenals/Urinary Tract: Adrenal glands are unremarkable. Kidneys are normal, without renal calculi, focal lesion, or hydronephrosis. Bladder is unremarkable. Stomach/Bowel: The stomach and small bowel are normal. Moderate fecal loading in the colon. The colon is otherwise unremarkable. The appendix is unremarkable. Vascular/Lymphatic: Calcified atherosclerotic changes are identified in the abdominal aorta, extending into the iliac and femoral vessels. No adenopathy. Splenic varices are noted. Reproductive: Uterus and bilateral adnexa are unremarkable. Other: There is moderate ascites in the abdomen adjacent to the liver and spleen. There is also significant ascites in the pelvis. The amount of ascites is similar since March 26, 2022. There is a large peripherally enhancing complex fluid collection. The collection extends from near the tail of the pancreas posteriorly and inferiorly through the left posterior musculature, X into the subcutaneous fat. The collection extends to the skin surface. Again, the margins are irregular and the collection is difficult to measure. The collection is significantly larger compared to March 26, 2022 1 only a small amount of fluid remain near the pancreatic tail. However, the collection is a little smaller than the January 22, 2022 study. Musculoskeletal: No acute or significant osseous  findings. IMPRESSION: 1. There is a large peripherally enhancing complex fluid collection in the left abdomen extending from the tail of the pancreas posteriorly and inferiorly into the subcutaneous fat. The collection is significantly larger compared to March 26, 2022. However, the collection is a little smaller than the January 22, 2022 study. The collection extends to the skin surface. This collection may represent a pseudocyst given previous history. An abscess could have a similar appearance. Whether the collection is sterile or infected cannot be determined on this study. There is no obvious air in the collection. 2. Cirrhosis with evidence of portal venous hypertension including splenic varices and moderate ascites. 3. Moderate fecal loading in the colon. 4. Calcified atherosclerotic changes in the abdominal aorta, iliac vessels, and femoral vessels. 5. Aortic atherosclerosis. Aortic Atherosclerosis (ICD10-I70.0). Electronically Signed   By: DDorise BullionIII M.D.   On: 04/06/2022 18:29    Labs:  CBC: Recent Labs    04/06/22 1438 04/07/22 0220 04/08/22 0520 04/09/22 0438  WBC 6.4 7.7 5.7 4.6  HGB 12.7 12.1 10.5* 11.8*  HCT 39.0 35.9* 32.2* 36.0  PLT 167 190 172 205    COAGS: Recent Labs    08/07/21  1645 12/13/21 1129 01/22/22 1217 01/23/22 0508 01/25/22 0509 01/27/22 0523 03/15/22 1110  INR 1.3*   < > 1.5* 1.8* 1.2 1.3* 1.1*  APTT 31  --  32  --   --   --   --    < > = values in this interval not displayed.    BMP: Recent Labs    04/06/22 1438 04/07/22 0220 04/08/22 0520 04/09/22 0438  NA 133* 130* 131* 134*  K 3.7 4.2 3.7 4.0  CL 97* 97* 101 100  CO2 22 24 20* 25  GLUCOSE 280* 243* 161* 137*  BUN 35* 35* 31* 36*  CALCIUM 9.6 9.2 8.5* 9.3  CREATININE 0.93 0.92 0.95 0.95  GFRNONAA >60 >60 >60 >60    LIVER FUNCTION TESTS: Recent Labs    03/15/22 1110 04/06/22 1438 04/08/22 0520 04/09/22 0438  BILITOT 0.7 1.0 0.8 0.6  AST 52* _0 ALT _1 ALKPHOS 295* 299* 290* 279*  PROT 8.8* 8.3* 6.7 7.2  ALBUMIN 4.3 3.5 3.0* 3.3*    Assessment and Plan:  Retroperitoneal Abscess --drain placed 04/07/22 --patient tolerating drain well --discussed and instructed on drain care at home --patient verbalizes confidence in at home care as she expects impending discharge --flush material sent to [facility] outpatient pharmacy --OP follow up appointment to be scheduled with IR.  Scheduler will call patient  Plan: Continue TID flushes with 5 cc NS. Record output Q shift. Dressing changes QD or PRN if soiled.   Call IR APP or on call IR MD if difficulty flushing or sudden change in drain output.   IR will continue to follow - please call with questions or concerns.  Electronically Signed: Pasty Spillers, PA 04/09/2022, 11:40 AM   I spent a total of 25 Minutes at the the patient's bedside AND on the patient's hospital floor or unit, greater than 50% of which was counseling/coordinating care for drain management

## 2022-04-09 NOTE — Progress Notes (Signed)
PROGRESS NOTE    Mallory Stewart  [id number] DOB: [birth year] DOA: 04/06/2022 PCP: [name], MD   Brief Narrative:  The patient is a 61 year old Caucasian female with a past medical history significant for but not limited to hypertension, portal hypertension, history of gastric varices, chronic alcoholic pancreatitis with pseudocyst, history of alcoholic cirrhosis of the liver, diabetes mellitus type 2, hyperlipidemia, history of C. difficile colitis as well as other comorbidities who presents with recurrent flank abscess.  Of note she was hospitalized from 01/22/2022 until 01/29/2022 with sepsis secondary cellulitis and a large abscess collection.  She was also hospitalized in August for infected noted pseudocyst and was found to have cellulitis of the left leg 24 hours hemoglobin with a large abscess of the left posterior pararenal space with extension the posterior left abdominal wall.  IR was consulted and she underwent ultrasound-guided aspiration of the left posterior abdominal wall collection.  General surgery did I&D on 01/05/2022 and aerobic and anaerobic culture showed Klebsiella pneumonia.  She was treated with Unasyn and discharged on Augmentin for 4 weeks per ID.  She is prescribed oral vancomycin for C. difficile prophylaxis.  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  She had repeat CT of the abdomen pelvis on 03/29/2022 showing complete resolution of the abscess and retroperitoneal space.  However about 6 days ago she began to feel flank pain and started developing chills.  She felt her back and knew that she had an infection that returned so she went to go gastroenterology on 04/06/2022 who directed her to the ED.   In the ED she had no leukocytosis or anemia and she did have elevated glucose of  255.  CT scan of the abdomen pelvis showed large peripherally enhancing complex fluid collection extends from the tail of the pancreas posterior and evaluate through the left posterior musculature into the subcutaneous fat and skin surface.  There is moderate ascites in the abdomen adjacent to the liver and spleen.  The EDP discussed with general surgery who consulted and recommended having IR consider percutaneous drain as they felt that she may still have likely a infected pseudocyst.  There is consideration for open drainage in the operating room and given that she is in minimal distress surgery recommended trying a percutaneous drain first.  Antibiotics are being resumed and continued and IR has evaluated and they will do a possible percutaneous aspiration and drain placement.  Patient is now status post an ultrasound-guided drain placement and had a paracentesis today which yielded 850 mL from the Right upper quadrant.  ID has been consulted and recommending continuing on IV Unasyn for now and await sensitivities to see if Augmentin can still work for her.  ID is also increased her seizure prophylaxis to 125 mg p.o. twice daily of oral vancomycin and recommending anticipating treatment with a 4 to 8-week antibiotic course.  They feel that if this creates a pancreatico cutaneous fistula she will need further management and surgery at an academic center  Assessment and Plan: * Abscess of flank -Hx of large left posterior abdominal wall abscess in September requiring IR ultrasound-guided aspiration and I&D with general surgery. Anaerobic/aerobic culture at that time positive for Klebsiella pneumoniae. -CT abdomen pelvis today showed large peripherally enhancing complex fluid collection that extends from the tail of the pancreas  posteriorly and inferiorly through the left posterior musculature into the subcutaneous fat and skin surface.  There is moderate ascites in the abdomen adjacent to the liver and  spleen. -Was started on IV Unasyn as she has sensitivity to it with the past retroperitoneal abscess and will continue -General surgery Dr. Rush Farmer consulted and will see in consultation in the morning and after evaluation here recommending continue antibiotics and getting interventional radiology involved to determine whether he can place a drain or not; she is leaning towards a percutaneous drain as this pseudocyst might may still likely be infected but she may need to go to the operating room for open drainage but given her chronic liver disease and chronic necrotized this remains a difficult situation -IR consulted and patient was taken to the interventional radiology suite and had an ultrasound-guided placement into the retroperitoneal abscess with a 12 French locking placed to bulb and samples been sent for micro; cultures are growing gram-negative rods with sensitivities pending. -Will order a paracentesis and this was done and had 850 mL -General surgery sending amylase level and recommending antibiotics and continuing diet today.  They are uncertain if this ends up being a pancreatic fistula in light of the patient's cirrhosis -ID is involved and recommending continue IV Unasyn until sensitivities come back for Klebsiella pneumoniae as there was see if Augmentin is still a viable option for her -Is on C. difficile prophylaxis given her history of C. Difficile -Per ID if this abscess creates a pancreaticocutaneous fistula she will need further management by surgery and follow-up academic center -PT OT recommending SNF    Type 2 diabetes mellitus with hyperglycemia, with long-term current use of insulin (HCC) -Home regimen includes Basaglar 30 U with humalog SSI  -Start resistant SSI for now while she is NPO for anticipated procedure in the morning -CBGs ranging from 156-291 -Hemoglobin A1c was 7.4 -Monitor CBGs per protocol and adjust insulin regimen as necessary based on diet status    Chronic alcoholic cirrhosis of liver with ascites ([place]) -CT A/P shows moderate ascites around liver and spleen but likely due to ongoing infection. Does not appear to have liver decompensation at this time. -Will order a ultrasound of the abdomen paracentesis to evaluate and see if she can benefit from paracentesis -continue Lasix and Spirolactone -She was sent from the GI office and is getting a paracentesis today -Dr. Carlean Purl has been added to the treatment team   Hyponatremia -Patient's sodium has gone from 135 -> 130 -> 133 -> 130 -> 131 and is now 134 -Continue to Monitor and Trend -Repeat CMP in the AM    Hypomagnesemia  -Patient's magnesium was 1.6 and improved to 2.2 -Replete with IV mag sulfate 2 g -Continue to monitor and replete as necessary -Repeat mag in a.m.  History of C. difficile -She is on C. difficile prophylaxis and infectious diseases has increased her oral vancomycin 225 mg twice daily and recommending discontinuing Florastor to minimize risk of disseminated infection   Normocytic Anemia -Patient's Hemoglobin/Hematocrit has gone from 12.1/35.9 is now 10.5/32.2 yesterday and today it is 11.8/36.0 -Anemia panel was checked and showed an iron level of 43, UIBC of 264, TIBC 307, saturation ratios of 14%, ferritin level 98, folate of 4.8, vitamin B12 low [id number] -Will start folic acid supplements 1 mg p.o. daily -Continue to monitor for signs and symptoms bleeding; no overt bleeding noted -Repeat CBC in the a.m.   Metabolic Acidosis, improved -Patient's CO2 is now 25, anion gap is  9, chloride level was 100 -Continue to monitor and trend and repeat CMP in a.m.   Hypoalbuminemia -Patient's albumin was low at 3.0 yesterday and today it is now 3.3 -Continue monitor trend and repeat CMP in a.m.   DVT prophylaxis: SCDs Start: 04/07/22 0043    Code Status: Full Code Family Communication: No family currently at bedside  Disposition Plan:  Level of care:  Telemetry Status is: Inpatient Remains inpatient appropriate because: She needs further specialist clearance and clearance by ID prior to safe discharge disposition   Consultants:  Interventional radiology Infectious diseases General surgery Notified GI as a courtesy  Procedures:  As delineated as above  Antimicrobials:  Anti-infectives (From admission, onward)    Start     Dose/Rate Route Frequency Ordered Stop   04/09/22 2200  vancomycin (VANCOCIN) capsule 125 mg        125 mg Oral 2 times daily 04/09/22 1426     04/08/22 2100  vancomycin (VANCOCIN) capsule 125 mg  Status:  Discontinued        125 mg Oral Daily 04/08/22 2003 04/09/22 1426   04/06/22 2200  Ampicillin-Sulbactam (UNASYN) 3 g in sodium chloride 0.9 % 100 mL IVPB        3 g 200 mL/hr over 30 Minutes Intravenous Every 6 hours 04/06/22 2042     04/06/22 2030  metroNIDAZOLE (FLAGYL) IVPB 500 mg  Status:  Discontinued        500 mg 100 mL/hr over 60 Minutes Intravenous Every 12 hours 04/06/22 2023 04/06/22 2029       Subjective: Seen and examined at bedside and she is doing okay.  States that the flank pain is still there but has improved.  Feels much better.  Denies any nausea or vomiting.  States that her paracentesis will be done later today.  No other concerns or complaints at this time.  Objective: Vitals:   04/08/22 2033 04/09/22 1505 04/09/22 1527 04/09/22 1642  BP: 104/81 (!) 139/91 133/87 (!) 123/93  Pulse: 77   65  Resp: 19   16  Temp: 98 F (36.7 C)   97.6 F (36.4 C)  TempSrc:      SpO2: 100%   100%  Weight:      Height:        Intake/Output Summary (Last 24 hours) at 04/09/2022 1710 Last data filed at 04/09/2022 0700 Gross per 24 hour  Intake --  Output 20 ml  Net -20 ml   Filed Weights   04/06/22 1411 04/07/22 1520  Weight: 64.4 kg 63.9 kg   Examination: Physical Exam:  Constitutional: WN/WD chronically ill-appearing Caucasian female currently no acute distress Respiratory:  Diminished to auscultation bilaterally, no wheezing, rales, rhonchi or crackles. Normal respiratory effort and patient is not tachypenic. No accessory muscle use.  Unlabored breathing Cardiovascular: RRR, no murmurs / rubs / gallops. S1 and S2 auscultated. Abdomen: Soft, non-tender, distended secondary to body habitus and ascites. Bowel sounds positive.  GU: Deferred.   Musculoskeletal: No clubbing / cyanosis of digits/nails. No joint deformity upper and lower extremities when she has a JP drain placed in the left flank.  Skin: No rashes, lesions, ulcers on limited skin evaluation. No induration; Warm and dry.  Neurologic: CN 2-12 grossly intact with no focal deficits. Romberg sign and cerebellar reflexes not assessed.  Psychiatric: Normal judgment and insight. Alert and oriented x 3. Normal mood and appropriate affect.   Data Reviewed: I have personally reviewed following labs and imaging studies  CBC:  Recent Labs  Lab 04/06/22 1438 04/07/22 0220 04/08/22 0520 04/09/22 0438  WBC 6.4 7.7 5.7 4.6  NEUTROABS 4.9  --  3.7 2.9  HGB 12.7 12.1 10.5* 11.8*  HCT 39.0 35.9* 32.2* 36.0  MCV 87.1 84.1 85.2 85.1  PLT 167 190 172 205   Basic Metabolic Panel: Recent Labs  Lab 04/06/22 1048 04/06/22 1438 04/07/22 0220 04/08/22 0520 04/09/22 0438  NA 130* 133* 130* 131* 134*  K 3.9 3.7 4.2 3.7 4.0  CL 94* 97* 97* 101 100  CO2 27 22 24  20* 25  GLUCOSE 255* 280* 243* 161* 137*  BUN 32* 35* 35* 31* 36*  CREATININE 0.90 0.93 0.92 0.95 0.95  CALCIUM 10.1 9.6 9.2 8.5* 9.3  MG  --   --   --  1.6* 2.2  PHOS  --   --   --  3.4 4.3   GFR: Estimated Creatinine Clearance: 64.3 mL/min (by C-G formula based on SCr of 0.95 mg/dL). Liver Function Tests: Recent Labs  Lab 04/06/22 1438 04/08/22 0520 04/09/22 0438  AST 27 23 23   ALT 24 20 17   ALKPHOS 299* 290* 279*  BILITOT 1.0 0.8 0.6  PROT 8.3* 6.7 7.2  ALBUMIN 3.5 3.0* 3.3*   Recent Labs  Lab 04/06/22 2130 04/09/22 0438  LIPASE 34  --    AMYLASE  --  29   No results for input(s): "AMMONIA" in the last 168 hours. Coagulation Profile: No results for input(s): "INR", "PROTIME" in the last 168 hours. Cardiac Enzymes: No results for input(s): "CKTOTAL", "CKMB", "CKMBINDEX", "TROPONINI" in the last 168 hours. BNP (last 3 results) Recent Labs    11/28/21 0831  PROBNP 69.0   HbA1C: No results for input(s): "HGBA1C" in the last 72 hours. CBG: Recent Labs  Lab 04/08/22 1704 04/08/22 2035 04/09/22 0732 04/09/22 1131 04/09/22 1644  GLUCAP 213* 187* 156* 216* 197*   Lipid Profile: No results for input(s): "CHOL", "HDL", "LDLCALC", "TRIG", "CHOLHDL", "LDLDIRECT" in the last 72 hours. Thyroid Function Tests: No results for input(s): "TSH", "T4TOTAL", "FREET4", "T3FREE", "THYROIDAB" in the last 72 hours. Anemia Panel: Recent Labs    04/09/22 0438  VITAMINB12 3,428*  FOLATE 4.8*  FERRITIN 98  TIBC 307  IRON 43  RETICCTPCT 1.1   Sepsis Labs: No results for input(s): "PROCALCITON", "LATICACIDVEN" in the last 168 hours.  Recent Results (from the past 240 hour(s))  Culture, fungus without smear     Status: None (Preliminary result)   Collection Time: 04/07/22  6:35 PM   Specimen: Drain; Body Fluid  Result Value Ref Range Status   Specimen Description   Final    FLUID Performed at [facility], [address], [place], [place]    Special Requests   Final    NONE Performed at [facility], [address] [place], [place]    Culture   Final    NO FUNGUS ISOLATED AFTER 2 DAYS Performed at [facility], [address] [place], [facility] [address]    Report Status PENDING  Incomplete  Aerobic/Anaerobic Culture w Gram Stain (surgical/deep wound)     Status: None (Preliminary result)   Collection Time: 04/07/22  6:44 PM   Specimen: Fluid; Abscess  Result Value Ref Range Status   Specimen Description   Final    FLUID Performed at [facility], [address], [place], [place]    Special Requests   Final    NONE  Performed at [facility], [address], [place], [place] [address]    Gram Stain   Final    FEW WBC PRESENT, PREDOMINANTLY PMN FEW GRAM NEGATIVE RODS Performed at [facility], [place] [address], [place], [place] [address]    Culture   Final    ABUNDANT KLEBSIELLA PNEUMONIAE SUSCEPTIBILITIES TO FOLLOW NO ANAEROBES ISOLATED; CULTURE IN PROGRESS FOR 5 DAYS    Report Status PENDING  Incomplete    Radiology Studies: US Paracentesis  Result Date: 04/09/2022 INDICATION: Patient with history of alcoholic cirrhosis, chronic pancreatitis with pancreatic pseudocyst, recurrent ascites, recently drained retroperitoneal abscess; request received for diagnostic and therapeutic paracentesis EXAM: ULTRASOUND GUIDED DIAGNOSTIC AND THERAPEUTIC PARACENTESIS MEDICATIONS: 8 ml 1 % lidocaine COMPLICATIONS: None immediate. PROCEDURE: Informed written consent was obtained from the patient after a discussion of the risks, benefits and alternatives to treatment. A timeout was performed prior to the initiation of the procedure. Initial ultrasound scanning demonstrates a small amount of ascites within the right upper abdominal quadrant. The right upper abdomen was prepped and draped in the usual sterile fashion. 1% lidocaine was used for local anesthesia. Following this, a 19 gauge, 7-cm, Yueh catheter was introduced. An ultrasound image was saved for documentation purposes. The paracentesis was performed. The catheter was removed and a dressing was applied. The patient tolerated the procedure well without immediate post procedural complication. FINDINGS: A total of approximately 850 cc of yellow fluid was removed. Samples were sent to the laboratory as requested by the clinical team. IMPRESSION: Successful ultrasound-guided diagnostic and therapeutic paracentesis yielding 850 cc l of peritoneal  fluid. PLAN: The patient has required >/=2 paracenteses in a 30 day period and a formal evaluation by the [facility] has been arranged. Read by: Rowe Robert, PA-C Electronically Signed   By: Ruthann Cancer M.D.   On: 04/09/2022 15:40   [place] Abscess Drain  Result Date: 04/08/2022 INDICATION: Left retroperitoneal abscess extending into the left flank soft tissues EXAM: Ultrasound-guided placement of drain into left retroperitoneal abscess MEDICATIONS: The patient is currently admitted to the hospital and receiving intravenous antibiotics. The antibiotics were administered within an appropriate time frame prior to the initiation of the procedure. ANESTHESIA/SEDATION: Moderate (conscious) sedation was employed during this procedure. A total of Versed 2 mg and Fentanyl 100 mcg was administered intravenously by the radiology nurse. Total intra-service moderate Sedation Time: 17 minutes. The patient's level of consciousness and vital signs were monitored continuously by radiology nursing throughout the procedure under my direct supervision. COMPLICATIONS: None immediate. PROCEDURE: Informed written consent was obtained from the patient after a thorough discussion of the procedural risks, benefits and alternatives. All questions were addressed. Maximal Sterile Barrier Technique was utilized including caps, mask, sterile gowns, sterile gloves, sterile drape, hand hygiene and skin antiseptic. A timeout was performed prior to the initiation of the procedure. The patient was placed prone on the exam table. Ultrasound of the left flank demonstrated complex fluid collection. Skin entry site was marked, and the overlying skin was prepped draped in the standard sterile fashion. Local analgesia was obtained with 1% lidocaine. Using ultrasound guidance, a 12 French locking drainage catheter was advanced into the left retroperitoneal/flank fluid collection using trocar technique.  Location within the collection was confirmed with visualization of the pigtail catheter within the collection. There was return of purulent material. A sample was sent to the lab for analysis. The drainage catheter was attached to bulb suction. It was secured to the  skin using silk suture and a dressing. The patient tolerated the procedure well without immediate complication. IMPRESSION: Successful ultrasound-guided placement of a 12 French locking drainage catheter into the left retroperitoneal/flank abscess. A sample was sent to the lab for microbiology analysis. Electronically Signed   By: Albin Felling M.D.   On: 04/08/2022 11:51    Scheduled Meds:  vitamin B-12  5,000 mcg Oral Daily   furosemide  20 mg Oral Daily   insulin aspart  0-20 Units Subcutaneous TID PC & HS   loratadine  10 mg Oral Daily   sodium chloride flush  5 mL Intracatheter Q8H   spironolactone  50 mg Oral Daily   traZODone  100 mg Oral QHS   vancomycin  125 mg Oral BID   Continuous Infusions:  ampicillin-sulbactam (UNASYN) IV 3 g (04/09/22 1551)    LOS: 3 days   Raiford Noble, DO [facility] Available via Epic secure chat 7am-7pm After these hours, please refer to coverage provider listed on [email or web address] 04/09/2022, 5:10 PM

## 2022-04-09 NOTE — Procedures (Signed)
PROCEDURE SUMMARY:  Successful ultrasound guided paracentesis from the right upper quadrant.  Yielded 850 cc of yellow fluid.  No immediate complications.  The patient tolerated the procedure well.   Specimen was sent for labs.  EBL none  The patient has required >/=2 paracenteses in a 30 day period and a screening evaluation by the [facility] has been arranged.   Mallory Stewart

## 2022-04-09 NOTE — Evaluation (Signed)
Physical Therapy Evaluation-1x Patient Details Name: Mallory Stewart MRN: [id number] DOB: 1960-09-28 Today's Date: 04/09/2022  History of Present Illness  Patient is a 61 year old female who presented with recurrent flank abscess.S/P percutaneous drain placement by IR 12/2. PMH: large left posterior wall abscess in September with I&D, DM II with hyperglycemia, chronic alcoholic cirrhosis of liver, multiple concussions, vertigo, chronic balance issues  Clinical Impression  On eval, pt was Mod Ind with mobility. She walked ~400 feet around unit with/without cane. She reports she walked for ~20 minutes last night without incident. She reports being at her baseline and she plans to begin outpatient neuro rehab soon. 1x eval. Will sign off.        Recommendations for follow up therapy are one component of a multi-disciplinary discharge planning process, led by the attending physician.  Recommendations may be updated based on patient status, additional functional criteria and insurance authorization.  Follow Up Recommendations No PT follow up      Assistance Recommended at Discharge None  Patient can return home with the following       Equipment Recommendations None recommended by PT  Recommendations for Other Services       Functional Status Assessment Patient has not had a recent decline in their functional status     Precautions / Restrictions Precautions Precautions: Fall Precaution Comments: Jp drain L side Restrictions Weight Bearing Restrictions: No      Mobility  Bed Mobility Overal bed mobility: Modified Independent                  Transfers Overall transfer level: Modified independent                      Ambulation/Gait Ambulation/Gait assistance: Modified independent (Device/Increase time) Gait Distance (Feet): 400 Feet Assistive device: Straight cane Gait Pattern/deviations: Step-through pattern       General Gait Details: No overt LOB  with and without cane use. Decreased step lengths bilaterally likely as a compensatory strategy for balance deficits. Pt was unable to scan environment without fear of losing balance.  Stairs            Wheelchair Mobility    Modified Rankin (Stroke Patients Only)       Balance Overall balance assessment: Mild deficits observed, not formally tested                                           Pertinent Vitals/Pain Pain Assessment Pain Assessment: 0-10 Pain Score: 4  Pain Location: pain near drain site Pain Descriptors / Indicators: Discomfort, Sore Pain Intervention(s): Premedicated before session    Home Living Family/patient expects to be discharged to:: Private residence Living Arrangements: Spouse/significant other Available Help at Discharge: Available 24 hours/day;Family Type of Home: House Home Access: Level entry       Home Layout: Two level;Able to live on main level with bedroom/bathroom Home Equipment: Kasandra Knudsen - single point      Prior Function Prior Level of Function : Independent/Modified Independent               ADLs Comments: Patient reported being independent in ADLs. has husband support as needed at home     Hand Dominance   Dominant Hand: Right    Extremity/Trunk Assessment   Upper Extremity Assessment Upper Extremity Assessment: Defer to OT evaluation    Lower  Extremity Assessment Lower Extremity Assessment: Generalized weakness (neuropathy)    Cervical / Trunk Assessment Cervical / Trunk Assessment: Normal  Communication   Communication: No difficulties  Cognition Arousal/Alertness: Awake/alert Behavior During Therapy: WFL for tasks assessed/performed Overall Cognitive Status: Within Functional Limits for tasks assessed                                          General Comments      Exercises     Assessment/Plan    PT Assessment All further PT needs can be met in the next venue of  care (OPPT-pt reports she is planning to begin neuro rehab for balance)  PT Problem List Decreased balance       PT Treatment Interventions      PT Goals (Current goals can be found in the Care Plan section)  Acute Rehab PT Goals Patient Stated Goal: home soon PT Goal Formulation: All assessment and education complete, DC therapy    Frequency       Co-evaluation               AM-PAC PT "6 Clicks" Mobility  Outcome Measure Help needed turning from your back to your side while in a flat bed without using bedrails?: None Help needed moving from lying on your back to sitting on the side of a flat bed without using bedrails?: None Help needed moving to and from a bed to a chair (including a wheelchair)?: None Help needed standing up from a chair using your arms (e.g., wheelchair or bedside chair)?: None Help needed to walk in hospital room?: None Help needed climbing 3-5 steps with a railing? : None 6 Click Score: 24    End of Session   Activity Tolerance: Patient tolerated treatment well Patient left: in bed;with call bell/phone within reach        Time: 1157-1211 PT Time Calculation (min) (ACUTE ONLY): 14 min   Charges:   PT Evaluation $PT Eval Low Complexity: 1 Low        Doreatha Massed, PT Acute Rehabilitation  Office: [phone]

## 2022-04-09 NOTE — Evaluation (Signed)
Occupational Therapy Evaluation Patient Details Name: Mallory Stewart MRN: [id number] DOB: 10/03/1960 Today's Date: 04/09/2022   History of Present Illness Patient is a 60 year old female who presented with recurrent flank abscess. CT revealed "large peripherally enhancing complex fluid collection extends from tail of the pancreas posterior and evaluate through the left posterior musculature into the subcutaneous fat and skin surface.  There is moderate ascites in the abdomen adjacent to the liver and spleen"IR consulted for percutaneous drain placement.PMH: large left posterior wall abscess in September with I&D, DM II with hyperglycemia, chronic alcoholic cirrhosis of liver,   Clinical Impression   Patient evaluated by Occupational Therapy with no further acute OT needs identified. All education has been completed and the patient has no further questions. Patient endorsed being at baseline with ADLs at this time. Patient noted to have unsteadiness with functional mobility without AD in room with patient reporting being at baseline. Patient plans to transition to neuro rehab after d/c from hospital.  See below for any follow-up Occupational Therapy or equipment needs. OT is signing off. Thank you for this referral.       Recommendations for follow up therapy are one component of a multi-disciplinary discharge planning process, led by the attending physician.  Recommendations may be updated based on patient status, additional functional criteria and insurance authorization.   Follow Up Recommendations  No OT follow up     Assistance Recommended at Discharge Intermittent Supervision/Assistance  Patient can return home with the following Assistance with cooking/housework;Help with stairs or ramp for entrance;Assist for transportation    Functional Status Assessment  Patient has not had a recent decline in their functional status  Equipment Recommendations       Recommendations for Other  Services       Precautions / Restrictions Precautions Precaution Comments: Jp drain L side Restrictions Weight Bearing Restrictions: No      Mobility Bed Mobility Overal bed mobility: Needs Assistance Bed Mobility: Supine to Sit, Sit to Supine     Supine to sit: Supervision Sit to supine: Supervision        Transfers Overall transfer level: Needs assistance   Transfers: Sit to/from Stand, Bed to chair/wheelchair/BSC Sit to Stand: Supervision           General transfer comment: patient was supervision with no AD reaching out for doorframe in room with wide base of support.      Balance Overall balance assessment: Mild deficits observed, not formally tested                                         ADL either performed or assessed with clinical judgement   ADL Overall ADL's : At baseline                                       General ADL Comments: Patient endorsed being at baseline for ADLs at this time. patient able to complete LB dressing simulated tasks sitting EOB with figure four positioning of legs. patient noted to have wide base of support for functional mobility to and from bathroom in room with patient reporting this is near her normal especailly with concussion. patient declined to use cane. patient was educated on benefits of AD v.s. furnitnure walking. patient verbalized understanding. patient endorsed being at baseline  and not needing skiled OT services. patient was educated on ECT, recher use, and reducing twisting of back to aovid pressure on drain site. patient verbalized understanding.     Vision Patient Visual Report: No change from baseline       Perception     Praxis      Pertinent Vitals/Pain Pain Assessment Pain Assessment: 0-10 Pain Score: 4  Pain Location: pain near drain site Pain Descriptors / Indicators: Grimacing, Discomfort Pain Intervention(s): Limited activity within patient's tolerance,  Monitored during session     Hand Dominance Right   Extremity/Trunk Assessment Upper Extremity Assessment Upper Extremity Assessment: Overall WFL for tasks assessed   Lower Extremity Assessment Lower Extremity Assessment: Defer to PT evaluation   Cervical / Trunk Assessment Cervical / Trunk Assessment: Normal   Communication Communication Communication: No difficulties   Cognition Arousal/Alertness: Awake/alert Behavior During Therapy: WFL for tasks assessed/performed Overall Cognitive Status: Within Functional Limits for tasks assessed                                 General Comments: patient is plesant and cooperative. patient endorsed being at baseline for ADLs at this time     General Comments       Exercises     Shoulder Instructions      Home Living Family/patient expects to be discharged to:: Private residence Living Arrangements: Spouse/significant other Available Help at Discharge: Available 24 hours/day;Family Type of Home: House Home Access: Level entry     Home Layout: Two level;Able to live on main level with bedroom/bathroom     Bathroom Shower/Tub: Walk-in shower         Home Equipment: Gilmer Mor - single point          Prior Functioning/Environment Prior Level of Function : Independent/Modified Independent               ADLs Comments: Patient reported being independent in ADLs. has husband support as needed at home        OT Problem List:        OT Treatment/Interventions:      OT Goals(Current goals can be found in the care plan section) Acute Rehab OT Goals OT Goal Formulation: All assessment and education complete, DC therapy  OT Frequency:      Co-evaluation              AM-PAC OT "6 Clicks" Daily Activity     Outcome Measure Help from another person eating meals?: None Help from another person taking care of personal grooming?: None Help from another person toileting, which includes using toliet,  bedpan, or urinal?: A Little Help from another person bathing (including washing, rinsing, drying)?: A Little Help from another person to put on and taking off regular upper body clothing?: None Help from another person to put on and taking off regular lower body clothing?: A Little 6 Click Score: 21   End of Session    Activity Tolerance: Patient tolerated treatment well Patient left: in bed;with call bell/phone within reach  OT Visit Diagnosis: Unsteadiness on feet (R26.81)                Time: 1324-4010 OT Time Calculation (min): 12 min Charges:  OT General Charges $OT Visit: 1 Visit OT Evaluation $OT Eval Low Complexity: 1 Low  Aasiyah Auerbach OTR/L, MS Acute Rehabilitation Department Office# [phone]   Selinda Flavin 04/09/2022, 8:34 AM

## 2022-04-09 NOTE — Progress Notes (Signed)
Assumed care of pt. Pt currently in ultrasound for thoracentesis. Will assess pt when she returns to floor.

## 2022-04-09 NOTE — Progress Notes (Addendum)
Subjective/Chief Complaint: No new complaints - stable soreness around drain. Tolerating diet.   Objective: Vital signs in last 24 hours: Temp:  [97.7 F (36.5 C)-98 F (36.7 C)] 98 F (36.7 C) (12/03 2033) Pulse Rate:  [72-77] 77 (12/03 2033) Resp:  [19-20] 19 (12/03 2033) BP: (104-113)/(78-81) 104/81 (12/03 2033) SpO2:  [100 %] 100 % (12/03 2033)    Intake/Output from previous day: 12/03 0701 - 12/04 0700 In: 870 [P.O.:720; IV Piggyback:150] Out: 20 [Drains:20] Intake/Output this shift: No intake/output data recorded.  Exam: Awake and alert Abdomen soft, Non-tender, drain serosanguinous   Lab Results:  Recent Labs    04/08/22 0520 04/09/22 0438  WBC 5.7 4.6  HGB 10.5* 11.8*  HCT 32.2* 36.0  PLT 172 205    BMET Recent Labs    04/08/22 0520 04/09/22 0438  NA 131* 134*  K 3.7 4.0  CL 101 100  CO2 20* 25  GLUCOSE 161* 137*  BUN 31* 36*  CREATININE 0.95 0.95  CALCIUM 8.5* 9.3    PT/INR No results for input(s): "LABPROT", "INR" in the last 72 hours. ABG No results for input(s): "PHART", "HCO3" in the last 72 hours.  Invalid input(s): "PCO2", "PO2"  Studies/Results: [place] Abscess Drain  Result Date: 04/08/2022 INDICATION: Left retroperitoneal abscess extending into the left flank soft tissues EXAM: Ultrasound-guided placement of drain into left retroperitoneal abscess MEDICATIONS: The patient is currently admitted to the hospital and receiving intravenous antibiotics. The antibiotics were administered within an appropriate time frame prior to the initiation of the procedure. ANESTHESIA/SEDATION: Moderate (conscious) sedation was employed during this procedure. A total of Versed 2 mg and Fentanyl 100 mcg was administered intravenously by the radiology nurse. Total intra-service moderate Sedation Time: 17 minutes. The patient's level of consciousness and vital signs were monitored continuously by radiology nursing throughout the procedure under my direct  supervision. COMPLICATIONS: None immediate. PROCEDURE: Informed written consent was obtained from the patient after a thorough discussion of the procedural risks, benefits and alternatives. All questions were addressed. Maximal Sterile Barrier Technique was utilized including caps, mask, sterile gowns, sterile gloves, sterile drape, hand hygiene and skin antiseptic. A timeout was performed prior to the initiation of the procedure. The patient was placed prone on the exam table. Ultrasound of the left flank demonstrated complex fluid collection. Skin entry site was marked, and the overlying skin was prepped draped in the standard sterile fashion. Local analgesia was obtained with 1% lidocaine. Using ultrasound guidance, a 12 French locking drainage catheter was advanced into the left retroperitoneal/flank fluid collection using trocar technique. Location within the collection was confirmed with visualization of the pigtail catheter within the collection. There was return of purulent material. A sample was sent to the lab for analysis. The drainage catheter was attached to bulb suction. It was secured to the skin using silk suture and a dressing. The patient tolerated the procedure well without immediate complication. IMPRESSION: Successful ultrasound-guided placement of a 12 French locking drainage catheter into the left retroperitoneal/flank abscess. A sample was sent to the lab for microbiology analysis. Electronically Signed   By: Olive Bass M.D.   On: 04/08/2022 11:51    Anti-infectives: Anti-infectives (From admission, onward)    Start     Dose/Rate Route Frequency Ordered Stop   04/08/22 2100  vancomycin (VANCOCIN) capsule 125 mg        125 mg Oral Daily 04/08/22 2003     04/06/22 2200  Ampicillin-Sulbactam (UNASYN) 3 g in sodium chloride 0.9 % 100  mL IVPB        3 g 200 mL/hr over 30 Minutes Intravenous Every 6 hours 04/06/22 2042     04/06/22 2030  metroNIDAZOLE (FLAGYL) IVPB 500 mg  Status:   Discontinued        500 mg 100 mL/hr over 60 Minutes Intravenous Every 12 hours 04/06/22 2023 04/06/22 2029       Assessment/Plan: Recurrent flank and intra-abdominal abscess versus infected pseudocyst  - s/p IR drain 12/2 in RP abscess. Culture in process. ID following - Will send amylase and lipase level on fluid from drain- discussed with RN today - continue abx - tolerating diet - Workup ongoing.  Uncertain of next step if this ends up being a pancreatic fistula in light of patient's cirrhosis. Will not need surgical intervention this admission. Dispo per primary/ID. Stable for dc from surgical perspective  We will sign off but please do not hesitate to contact us with any questions or concerns or reconsult if needed.   FEN: reg ID: [email or web address] VTE: SCDs  Per primary T2DM Chronic ETOH ascites with cirrhosis Anemia   Eric Form, [facility] Surgery 04/09/2022, 8:51 AM Please see Amion for pager number during day hours 7:00am-4:30pm\

## 2022-04-09 NOTE — Progress Notes (Signed)
[facility]    Date of Admission:  04/06/2022   Total days of antibiotics 4 of amp/sub          ID: Mallory Stewart is a 61 y.o. female with recurrent pancreatic pseudocyst extending to subcutanous fat of left flank/wall. S/p drain placement. Prelim cx showing kleb pneumonaie Principal Problem:   Abscess of flank Active Problems:   Alcoholic cirrhosis of liver with ascites (HCC)   Type 2 diabetes mellitus with hyperglycemia, with long-term current use of insulin (HCC)    Subjective: Afebrile now x 2 days. Still some tenderness to left flank wall.  Medications:   vitamin B-12  5,000 mcg Oral Daily   furosemide  20 mg Oral Daily   insulin aspart  0-20 Units Subcutaneous TID PC & HS   lidocaine       loratadine  10 mg Oral Daily   sodium chloride flush  5 mL Intracatheter Q8H   spironolactone  50 mg Oral Daily   traZODone  100 mg Oral QHS   vancomycin  125 mg Oral BID    Objective: Vital signs in last 24 hours: Temp:  [97.7 F (36.5 C)-98 F (36.7 C)] 98 F (36.7 C) (12/03 2033) Pulse Rate:  [72-77] 77 (12/03 2033) Resp:  [19-20] 19 (12/03 2033) BP: (104-113)/(78-81) 104/81 (12/03 2033) SpO2:  [100 %] 100 % (12/03 2033)  Physical Exam  Constitutional:  oriented to person, place, and time. appears well-developed and well-nourished. No distress.  HENT: [place]/AT, PERRLA, no scleral icterus Mouth/Throat: Oropharynx is clear and moist. No oropharyngeal exudate.  Cardiovascular: Normal rate, regular rhythm and normal heart sounds. Exam reveals no gallop and no friction rub.  No murmur heard.  Pulmonary/Chest: Effort normal and breath sounds normal. No respiratory distress.  has no wheezes.  Neck = supple, no nuchal rigidity Abdominal: Soft. Bowel sounds are normal.  exhibits no distension. There is no tenderness. Left flank serosanginous fluid in bulb Lymphadenopathy: no cervical adenopathy. No axillary adenopathy Neurological: alert and oriented  to person, place, and time.  Skin: Skin is warm and dry. No rash noted. No erythema.     Lab Results Recent Labs    04/08/22 0520 04/09/22 0438  WBC 5.7 4.6  HGB 10.5* 11.8*  HCT 32.2* 36.0  NA 131* 134*  K 3.7 4.0  CL 101 100  CO2 20* 25  BUN 31* 36*  CREATININE 0.95 0.95   Liver Panel Recent Labs    04/08/22 0520 04/09/22 0438  PROT 6.7 7.2  ALBUMIN 3.0* 3.3*  AST 23 23  ALT 20 17  ALKPHOS 290* 279*  BILITOT 0.8 0.6   Lipase of fluid collection - pending Microbiology: 2 d ago   Specimen Description FLUID Performed at [facility], [address] [place], [place] [address]  Special Requests NONE Performed at [facility], [address] [place], [place] [address]  Gram Stain FEW WBC PRESENT, PREDOMINANTLY PMN FEW GRAM NEGATIVE RODS Performed at [facility], [address], [place], [place] [address]  Culture ABUNDANT KLEBSIELLA PNEUMONIAE SUSCEPTIBILITIES TO FOLLOW NO ANAEROBES ISOLATED; CULTURE IN PROGRESS FOR 5 DAYS   Historic micro: 01/25/2022 Klebsiella pneumoniae      MIC    AMPICILLIN >=32 RESIST... Resistant    AMPICILLIN/SULBACTAM 4 SENSITIVE Sensitive    CEFAZOLIN <=4 SENSITIVE Sensitive    CEFEPIME <=0.12 SENS... Sensitive    CEFTAZIDIME <=1 SENSITIVE Sensitive    CEFTRIAXONE <=0.25 SENS... Sensitive  CIPROFLOXACIN <=0.25 SENS... Sensitive    GENTAMICIN <=1 SENSITIVE Sensitive    IMIPENEM <=0.25 SENS... Sensitive    PIP/TAZO <=4 SENSITIVE Sensitive    TRIMETH/SULFA <=20 SENSIT... Sensitive    Studies/Results:  IMPRESSION: 1. There is a large peripherally enhancing complex fluid collection in the left abdomen extending from the tail of the pancreas posteriorly and inferiorly into the subcutaneous fat. The collection is significantly larger compared to March 26, 2022. However, the collection is a little smaller than the January 22, 2022 study. The collection extends to the skin surface.  This collection may represent a pseudocyst given previous history. An abscess could have a similar appearance. Whether the collection is sterile or infected cannot be determined on this study. There is no obvious air in the collection. 2. Cirrhosis with evidence of portal venous hypertension including splenic varices and moderate ascites. [place] Abscess Drain  Result Date: 04/08/2022 INDICATION: Left retroperitoneal abscess extending into the left flank soft tissues EXAM: Ultrasound-guided placement of drain into left retroperitoneal abscess MEDICATIONS: The patient is currently admitted to the hospital and receiving intravenous antibiotics. The antibiotics were administered within an appropriate time frame prior to the initiation of the procedure. ANESTHESIA/SEDATION: Moderate (conscious) sedation was employed during this procedure. A total of Versed 2 mg and Fentanyl 100 mcg was administered intravenously by the radiology nurse. Total intra-service moderate Sedation Time: 17 minutes. The patient's level of consciousness and vital signs were monitored continuously by radiology nursing throughout the procedure under my direct supervision. COMPLICATIONS: None immediate. PROCEDURE: Informed written consent was obtained from the patient after a thorough discussion of the procedural risks, benefits and alternatives. All questions were addressed. Maximal Sterile Barrier Technique was utilized including caps, mask, sterile gowns, sterile gloves, sterile drape, hand hygiene and skin antiseptic. A timeout was performed prior to the initiation of the procedure. The patient was placed prone on the exam table. Ultrasound of the left flank demonstrated complex fluid collection. Skin entry site was marked, and the overlying skin was prepped draped in the standard sterile fashion. Local analgesia was obtained with 1% lidocaine. Using ultrasound guidance, a 12 French locking drainage catheter was advanced into the left  retroperitoneal/flank fluid collection using trocar technique. Location within the collection was confirmed with visualization of the pigtail catheter within the collection. There was return of purulent material. A sample was sent to the lab for analysis. The drainage catheter was attached to bulb suction. It was secured to the skin using silk suture and a dressing. The patient tolerated the procedure well without immediate complication. IMPRESSION: Successful ultrasound-guided placement of a 12 French locking drainage catheter into the left retroperitoneal/flank abscess. A sample was sent to the lab for microbiology analysis. Electronically Signed   By: Olive Bass M.D.   On: 04/08/2022 11:51     Assessment/Plan: 61yo F with hx of etoh related cirrhosis previously treated for 2 months oral abtx and drainage of previous infected pseudocyst at end of sept through mid November. She had reaccumulation at end of November/beginning of December and admitted for management by Dr Stephania Fragmin saw dr Francena Hanly.  - continue on amp/sub IV for now .awaiting on sensitivities to see if amox/clav can still work for her - cdiff prophylaxis - will increase to oral vanco 125mg  bid. Will d/c florastor to minimize risk of disseminated infection. Can do probiotics through yogurt - anticipate treatment will take 4-8 wk as it has done prior. - if this creates pancreaticocutanous fistula - will need further management per surgery/academic  center.  Duke Salvia Drue Second MD MPH [facility] [phone]   [facility] Pager: [phone]  04/09/2022, 2:28 PM

## 2022-04-10 ENCOUNTER — Other Ambulatory Visit (HOSPITAL_COMMUNITY): Payer: Self-pay | Admitting: Internal Medicine

## 2022-04-10 DIAGNOSIS — Z8619 Personal history of other infectious and parasitic diseases: Secondary | ICD-10-CM | POA: Insufficient documentation

## 2022-04-10 DIAGNOSIS — L02211 Cutaneous abscess of abdominal wall: Secondary | ICD-10-CM | POA: Diagnosis not present

## 2022-04-10 DIAGNOSIS — E871 Hypo-osmolality and hyponatremia: Secondary | ICD-10-CM | POA: Insufficient documentation

## 2022-04-10 DIAGNOSIS — E1165 Type 2 diabetes mellitus with hyperglycemia: Secondary | ICD-10-CM | POA: Diagnosis not present

## 2022-04-10 DIAGNOSIS — K7031 Alcoholic cirrhosis of liver with ascites: Secondary | ICD-10-CM | POA: Diagnosis not present

## 2022-04-10 DIAGNOSIS — E8809 Other disorders of plasma-protein metabolism, not elsewhere classified: Secondary | ICD-10-CM | POA: Insufficient documentation

## 2022-04-10 DIAGNOSIS — Z794 Long term (current) use of insulin: Secondary | ICD-10-CM | POA: Diagnosis not present

## 2022-04-10 DIAGNOSIS — E872 Acidosis, unspecified: Secondary | ICD-10-CM | POA: Insufficient documentation

## 2022-04-10 DIAGNOSIS — D649 Anemia, unspecified: Secondary | ICD-10-CM | POA: Insufficient documentation

## 2022-04-10 LAB — COMPREHENSIVE METABOLIC PANEL
ALT: 15 U/L (ref 0–44)
AST: 19 U/L (ref 15–41)
Albumin: 2.9 g/dL — ABNORMAL LOW (ref 3.5–5.0)
Alkaline Phosphatase: 238 U/L — ABNORMAL HIGH (ref 38–126)
Anion gap: 10 (ref 5–15)
BUN: 31 mg/dL — ABNORMAL HIGH (ref 6–20)
CO2: 21 mmol/L — ABNORMAL LOW (ref 22–32)
Calcium: 9 mg/dL (ref 8.9–10.3)
Chloride: 100 mmol/L (ref 98–111)
Creatinine, Ser: 0.96 mg/dL (ref 0.44–1.00)
GFR, Estimated: >60 mL/min (ref >60)
Glucose, Bld: 166 mg/dL — ABNORMAL HIGH (ref 70–99)
Potassium: 4 mmol/L (ref 3.5–5.1)
Sodium: 131 mmol/L — ABNORMAL LOW (ref 135–145)
Total Bilirubin: 0.5 mg/dL (ref 0.3–1.2)
Total Protein: 6.7 g/dL (ref 6.5–8.1)

## 2022-04-10 LAB — CBC WITH DIFFERENTIAL/PLATELET
Abs Immature Granulocytes: 0.04 10*3/uL (ref 0.00–0.07)
Basophils Absolute: 0.1 10*3/uL (ref 0.0–0.1)
Basophils Relative: 1 %
Eosinophils Absolute: 0.2 10*3/uL (ref 0.0–0.5)
Eosinophils Relative: 3 %
HCT: 35.5 % — ABNORMAL LOW (ref 36.0–46.0)
Hemoglobin: 11.5 g/dL — ABNORMAL LOW (ref 12.0–15.0)
Immature Granulocytes: 1 %
Lymphocytes Relative: 23 %
Lymphs Abs: 1.2 10*3/uL (ref 0.7–4.0)
MCH: 28 pg (ref 26.0–34.0)
MCHC: 32.4 g/dL (ref 30.0–36.0)
MCV: 86.6 fL (ref 80.0–100.0)
Monocytes Absolute: 0.5 10*3/uL (ref 0.1–1.0)
Monocytes Relative: 9 %
Neutro Abs: 3.1 10*3/uL (ref 1.7–7.7)
Neutrophils Relative %: 63 %
Platelets: 209 10*3/uL (ref 150–400)
RBC: 4.1 MIL/uL (ref 3.87–5.11)
RDW: 13.3 % (ref 11.5–15.5)
WBC: 4.9 10*3/uL (ref 4.0–10.5)
nRBC: 0 % (ref 0.0–0.2)

## 2022-04-10 LAB — AMYLASE, BODY FLUID (OTHER): Amylase, Body Fluid: 1269 U/L

## 2022-04-10 LAB — AMYLASE, PLEURAL OR PERITONEAL FLUID: Amylase, Fluid: 12 U/L

## 2022-04-10 LAB — GLUCOSE, CAPILLARY
Glucose-Capillary: 182 mg/dL — ABNORMAL HIGH (ref 70–99)
Glucose-Capillary: 264 mg/dL — ABNORMAL HIGH (ref 70–99)

## 2022-04-10 LAB — MAGNESIUM: Magnesium: 1.6 mg/dL — ABNORMAL LOW (ref 1.7–2.4)

## 2022-04-10 LAB — PHOSPHORUS: Phosphorus: 4.4 mg/dL (ref 2.5–4.6)

## 2022-04-10 MED ORDER — MAGNESIUM SULFATE 2 GM/50ML IV SOLN
2.0000 g | Freq: Once | INTRAVENOUS | Status: AC
  Administered 2022-04-10: 2 g via INTRAVENOUS
  Filled 2022-04-10: qty 50

## 2022-04-10 MED ORDER — OXYCODONE HCL 5 MG PO TABS: 5.0000 mg | ORAL_TABLET | Freq: Four times a day (QID) | ORAL | 0 refills | Status: DC | PRN

## 2022-04-10 MED ORDER — VANCOMYCIN HCL 125 MG PO CAPS: 125.0000 mg | ORAL_CAPSULE | Freq: Two times a day (BID) | ORAL | 0 refills | Status: AC

## 2022-04-10 MED ORDER — AMOXICILLIN-POT CLAVULANATE 875-125 MG PO TABS: 1.0000 | ORAL_TABLET | Freq: Two times a day (BID) | ORAL | 0 refills | Status: AC

## 2022-04-10 MED ORDER — FOLIC ACID 1 MG PO TABS: 1.0000 mg | ORAL_TABLET | Freq: Every day | ORAL | 0 refills | Status: DC

## 2022-04-10 NOTE — Discharge Summary (Addendum)
Physician Discharge Summary   Patient: Mallory Stewart MRN: [id number] DOB: 08-31-1960  Admit date:     04/06/2022  Discharge date: 04/10/22  Discharge Physician: Raiford Noble, DO   PCP: [name], MD   Recommendations at discharge:   Follow-up with PCP within 1 to 2 weeks and repeat CBC, CMP, mag, Phos within 1 week Follow-up with interventional radiology drain clinic Follow-up with gastroenterology within 1 to 2 weeks and have tertiary care referral to an academic center Follow-up with general surgery in outpatient setting  Discharge Diagnoses: Principal Problem:   Abscess of flank Active Problems:   Alcoholic cirrhosis of liver with ascites ([place])   Type 2 diabetes mellitus with hyperglycemia, with long-term current use of insulin (HCC)   Hyponatremia   Hypomagnesemia   History of Clostridioides difficile infection   Normocytic anemia   Metabolic acidosis   Hypoalbuminemia  Resolved Problems:   * No resolved hospital problems. [facility] Course: The patient is a 61 year old Caucasian female with a past medical history significant for but not limited to hypertension, portal hypertension, history of gastric varices, chronic alcoholic pancreatitis with pseudocyst, history of alcoholic cirrhosis of the liver, diabetes mellitus type 2, hyperlipidemia, history of C. difficile colitis as well as other comorbidities who presents with recurrent flank abscess.  Of note she was hospitalized from 01/22/2022 until 01/29/2022 with sepsis secondary cellulitis and a large abscess collection.  She was also hospitalized in August for infected noted pseudocyst and was found to have cellulitis of the left leg 24 hours hemoglobin with a large abscess of the left posterior pararenal space with extension the posterior left abdominal wall.  IR was consulted and she underwent ultrasound-guided aspiration of the left posterior abdominal wall collection.  General surgery did I&D on 01/05/2022 and aerobic  and anaerobic culture showed Klebsiella pneumonia.  She was treated with Unasyn and discharged on Augmentin for 4 weeks per ID.  She is prescribed oral vancomycin for C. difficile prophylaxis.  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  She had repeat CT of the abdomen pelvis on 03/29/2022 showing complete resolution of the abscess and retroperitoneal space.  However about 6 days ago she began to feel flank pain and started developing chills.  She felt her back and knew that she had an infection that returned so she went to go gastroenterology on 04/06/2022 who directed her to the ED.   In the ED she had no leukocytosis or anemia and she did have elevated glucose of 255.  CT scan of the abdomen pelvis showed large peripherally enhancing complex fluid collection extends from the tail of the pancreas posterior and evaluate through the left posterior musculature into the subcutaneous fat and skin surface.  There is moderate ascites in the abdomen adjacent to the liver and spleen.  The EDP discussed with general surgery who consulted and recommended having IR consider percutaneous drain as they felt that she may still have likely a infected pseudocyst.  There is consideration for open drainage in the operating room and given that she is in minimal distress surgery recommended trying a percutaneous drain first.  Antibiotics are being resumed and continued and IR has evaluated and they will do a possible percutaneous aspiration and drain placement.   Patient is now status post an ultrasound-guided drain placement and  had a paracentesis today which yielded 850 mL from the Right upper quadrant.  ID has been consulted and recommending continuing on IV Unasyn for now and await sensitivities to see if Augmentin can still work for her.   ID is also increased her seizure prophylaxis to 125 mg p.o. twice daily of oral vancomycin and recommending anticipating treatment with a 4 to 8-week antibiotic course.  They feel that if this creates a pancreatico cutaneous fistula she will need further management and surgery at an academic center  She is improved and ID recommends continuing Augmentin for 6 weeks as well as double dosing of her vancomycin for prophylaxis for C. difficile.  GI signed off the case and recommending her follow-up at a tertiary care center in outpatient setting.  Surgery feels that she is stable and patient will follow-up with the drain clinic and continue drain flushes daily.  She is medically stable to discharge home and PT OT recommending no follow-up at this time  Assessment and Plan: * Abscess of flank -Hx of large left posterior abdominal wall abscess in September requiring IR ultrasound-guided aspiration and I&D with general surgery. Anaerobic/aerobic culture at that time positive for Klebsiella pneumoniae. -CT abdomen pelvis today showed large peripherally enhancing complex fluid collection that extends from the tail of the pancreas posteriorly and inferiorly through the left posterior musculature into the subcutaneous fat and skin surface.  There is moderate ascites in the abdomen adjacent to the liver and spleen. -Was started on IV Unasyn as she has sensitivity to it with the past retroperitoneal abscess and will continue -General surgery Dr. Rush Farmer consulted and will see in consultation in the morning and after evaluation here recommending continue antibiotics and getting interventional radiology involved to determine whether he can place a drain or not; she is leaning towards a percutaneous drain as this pseudocyst might may still likely be infected but she may need to go to the operating room for open drainage but given her chronic liver disease and chronic necrotized this remains a difficult situation -IR  consulted and patient was taken to the interventional radiology suite and had an ultrasound-guided placement into the retroperitoneal abscess with a 12 French locking placed to bulb and samples been sent for micro; cultures are growing gram-negative rods with sensitivities pending. -Will order a paracentesis and this was done and had 850 mL -General surgery sending amylase level and recommending antibiotics and continuing diet today.  They are uncertain if this ends up being a pancreatic fistula in light of the patient's cirrhosis -ID is involved and recommending continue IV Unasyn until sensitivities come back for Klebsiella pneumoniae as there was see if Augmentin is still a viable option for her -Is on C. difficile prophylaxis given her history of C. Difficile -Per ID if this abscess creates a pancreaticocutaneous fistula she will need further management by surgery and follow-up academic center; ID recommends 6 weeks of Augmentin -PT OT recommending no follow-up    Type 2 diabetes mellitus with hyperglycemia, with long-term current use of insulin (HCC) -Home regimen includes Basaglar 30 U with humalog SSI  -Start resistant SSI for now while she is NPO for anticipated procedure in the morning -CBGs ranging from 182-264 -Hemoglobin A1c was 7.4 -Monitor CBGs per protocol and adjust insulin regimen as necessary based on diet status   Chronic alcoholic cirrhosis of liver with ascites ([place]) -CT A/P shows moderate ascites around liver and spleen but likely due to ongoing infection. Does not appear to have liver decompensation  at this time. -Will order a ultrasound of the abdomen paracentesis to evaluate and see if she can benefit from paracentesis -continue Lasix and Spirolactone -She was sent from the GI office and is getting a paracentesis today -Dr. Carlean Purl has been added to the treatment team and he will follow-up with the patient in outpatient setting   Hyponatremia -Patient's sodium has gone  from 135 -> 130 -> 133 -> 130 -> 131 and is now 134 yesterday and today is now 131 -Continue to Monitor and Trend -Repeat CMP in the AM    Hypomagnesemia  -Patient's magnesium was 1.6 at the time of discharge -Replete with IV mag sulfate 2 g -Continue to monitor and replete as necessary -Repeat mag in a.m.   History of C. difficile -She is on C. difficile prophylaxis and infectious diseases has increased her oral vancomycin 225 mg twice daily and recommending discontinuing Florastor to minimize risk of disseminated infection   Normocytic Anemia -Patient's Hemoglobin/Hematocrit has gone from 12.1/35.9 -> 10.5/32.2 -> 11.8/36.0 -> 11.5/35.5 -Anemia panel was checked and showed an iron level of 43, UIBC of 264, TIBC 307, saturation ratios of 14%, ferritin level 98, folate of 4.8, vitamin B12 low 6812 -Will start folic acid supplements 1 mg p.o. daily -Continue to monitor for signs and symptoms bleeding; no overt bleeding noted -Repeat CBC in the a.m.   Metabolic Acidosis, mild -Patient's CO2 is now 21, anion gap is 10, chloride level was 100 -Continue to monitor and trend and repeat CMP in a.m.   Hypoalbuminemia -Patient's albumin was low at 3.0 yesterday and today it is now 3.3 -Continue monitor trend and repeat CMP in a.m.  Consultants: IR, ID, GI, General Surgery Procedures performed: Paracentesis, Flank Drain placement   Disposition: Home Diet recommendation:  Discharge Diet Orders (From admission, onward)     Start     Ordered   04/10/22 0000  Diet - low sodium heart healthy       Comments: 2 Gram Sodium Diet   04/10/22 1049           Cardiac diet DISCHARGE MEDICATION: Allergies as of 04/10/2022       Reactions   Ceftriaxone Hives   Immediate bright red itchy rash on bilateral legs after starting infusion.     Gluten Meal Other (See Comments)   Stomach distress   Zocor [simvastatin] Other (See Comments)   Elevated lfts?        Medication List     TAKE  these medications    ALPRAZolam 0.25 MG tablet Commonly known as: XANAX Take 1 tablet (0.25 mg total) by mouth 2 (two) times daily as needed for anxiety. Please ignore other prescription as change made. What changed:  when to take this additional instructions   amoxicillin-clavulanate 875-125 MG tablet Commonly known as: AUGMENTIN Take 1 tablet by mouth 2 (two) times daily.   Basaglar KwikPen 100 UNIT/ML Inject 30 Units into the skin daily.   CALCIUM PO Take 1 tablet by mouth in the morning and at bedtime.   cetirizine 10 MG tablet Commonly known as: ZYRTEC Take 10 mg by mouth at bedtime.   folic acid 1 MG tablet Commonly known as: FOLVITE Take 1 tablet (1 mg total) by mouth daily. Start taking on: April 11, 2022   furosemide 20 MG tablet Commonly known as: Lasix Take 1 tablet (20 mg total) by mouth daily. What changed: when to take this   insulin lispro 100 UNIT/ML KwikPen Commonly known as: HumaLOG KwikPen  Max daily 30 units What changed:  how much to take when to take this additional instructions   Insulin Pen Needle 32G X 4 MM Misc 1 Device by Does not apply route in the morning, at noon, in the evening, and at bedtime.   ONE TOUCH ULTRA 2 w/Device Kit Use to check blood sugars two times day   OneTouch Ultra test strip Generic drug: glucose blood CHECK BLOOD SUGAR TWICE DAILY AS DIRECTED   onetouch ultrasoft lancets Use to check blood sugar two times a day.   oxyCODONE 5 MG immediate release tablet Commonly known as: Oxy IR/ROXICODONE Take 1 tablet (5 mg total) by mouth every 6 (six) hours as needed for moderate pain.   sodium chloride flush 0.9 % Soln injection 5 mLs by Intracatheter route daily.   spironolactone 50 MG tablet Commonly known as: Aldactone Take 1 tablet (50 mg total) by mouth daily.   traZODone 100 MG tablet Commonly known as: DESYREL Take 0.5-1 tablets (50-100 mg total) by mouth at bedtime. What changed: how much to take    vancomycin 125 MG capsule Commonly known as: VANCOCIN Take 1 capsule (125 mg total) by mouth 2 (two) times daily. What changed: when to take this   Vitamin B-12 5000 MCG Subl Place 5,000 mcg under the tongue daily.   VITAMIN D3 PO Take 2 capsules by mouth in the morning and at bedtime.               Discharge Care Instructions  (From admission, onward)           Start     Ordered   04/10/22 0000  Discharge wound care:       Comments: Per IR   04/10/22 1049           Discharge Exam: Filed Weights   04/06/22 1411 04/07/22 1520  Weight: 64.4 kg 63.9 kg   Vitals:   04/09/22 2135 04/10/22 0523  BP: 126/85 113/72  Pulse: 78 70  Resp: 19 17  Temp: 98.2 F (36.8 C) 97.9 F (36.6 C)  SpO2: 100% 100%   Examination: Physical Exam:  Constitutional: WN/WD thin Caucasian female currently no acute distress Respiratory: Diminished to auscultation bilaterally, no wheezing, rales, rhonchi or crackles. Normal respiratory effort and patient is not tachypenic. No accessory muscle use.  Unlabored breathing Cardiovascular: RRR, no murmurs / rubs / gallops. S1 and S2 auscultated. No extremity edema. Abdomen: Soft, non-tender, mildly distended secondary to body habitus.  Bowel sounds positive.  GU: Deferred. Musculoskeletal: No clubbing / cyanosis of digits/nails. No joint deformity upper and lower extremities.   Skin: No rashes, lesions, ulcers on limited skin evaluation. No induration; Warm and dry.  Neurologic: CN 2-12 grossly intact with no focal deficits.  Romberg sign and cerebellar reflexes not assessed.  Psychiatric: Normal judgment and insight. Alert and oriented x 3. Normal mood and appropriate affect.   Condition at discharge: stable  The results of significant diagnostics from this hospitalization (including imaging, microbiology, ancillary and laboratory) are listed below for reference.   Imaging Studies: US Paracentesis  Result Date:  04/09/2022 INDICATION: Patient with history of alcoholic cirrhosis, chronic pancreatitis with pancreatic pseudocyst, recurrent ascites, recently drained retroperitoneal abscess; request received for diagnostic and therapeutic paracentesis EXAM: ULTRASOUND GUIDED DIAGNOSTIC AND THERAPEUTIC PARACENTESIS MEDICATIONS: 8 ml 1 % lidocaine COMPLICATIONS: None immediate. PROCEDURE: Informed written consent was obtained from the patient after a discussion of the risks, benefits and alternatives to treatment. A timeout was performed prior to  the initiation of the procedure. Initial ultrasound scanning demonstrates a small amount of ascites within the right upper abdominal quadrant. The right upper abdomen was prepped and draped in the usual sterile fashion. 1% lidocaine was used for local anesthesia. Following this, a 19 gauge, 7-cm, Yueh catheter was introduced. An ultrasound image was saved for documentation purposes. The paracentesis was performed. The catheter was removed and a dressing was applied. The patient tolerated the procedure well without immediate post procedural complication. FINDINGS: A total of approximately 850 cc of yellow fluid was removed. Samples were sent to the laboratory as requested by the clinical team. IMPRESSION: Successful ultrasound-guided diagnostic and therapeutic paracentesis yielding 850 cc l of peritoneal fluid. PLAN: The patient has required >/=2 paracenteses in a 30 day period and a formal evaluation by the [facility] has been arranged. Read by: Rowe Robert, PA-C Electronically Signed   By: Ruthann Cancer M.D.   On: 04/09/2022 15:40   [place] Abscess Drain  Result Date: 04/08/2022 INDICATION: Left retroperitoneal abscess extending into the left flank soft tissues EXAM: Ultrasound-guided placement of drain into left retroperitoneal abscess MEDICATIONS: The patient is currently admitted to the hospital and receiving intravenous antibiotics.  The antibiotics were administered within an appropriate time frame prior to the initiation of the procedure. ANESTHESIA/SEDATION: Moderate (conscious) sedation was employed during this procedure. A total of Versed 2 mg and Fentanyl 100 mcg was administered intravenously by the radiology nurse. Total intra-service moderate Sedation Time: 17 minutes. The patient's level of consciousness and vital signs were monitored continuously by radiology nursing throughout the procedure under my direct supervision. COMPLICATIONS: None immediate. PROCEDURE: Informed written consent was obtained from the patient after a thorough discussion of the procedural risks, benefits and alternatives. All questions were addressed. Maximal Sterile Barrier Technique was utilized including caps, mask, sterile gowns, sterile gloves, sterile drape, hand hygiene and skin antiseptic. A timeout was performed prior to the initiation of the procedure. The patient was placed prone on the exam table. Ultrasound of the left flank demonstrated complex fluid collection. Skin entry site was marked, and the overlying skin was prepped draped in the standard sterile fashion. Local analgesia was obtained with 1% lidocaine. Using ultrasound guidance, a 12 French locking drainage catheter was advanced into the left retroperitoneal/flank fluid collection using trocar technique. Location within the collection was confirmed with visualization of the pigtail catheter within the collection. There was return of purulent material. A sample was sent to the lab for analysis. The drainage catheter was attached to bulb suction. It was secured to the skin using silk suture and a dressing. The patient tolerated the procedure well without immediate complication. IMPRESSION: Successful ultrasound-guided placement of a 12 French locking drainage catheter into the left retroperitoneal/flank abscess. A sample was sent to the lab for microbiology analysis. Electronically Signed    By: Albin Felling M.D.   On: 04/08/2022 11:51   CT Abdomen Pelvis W Contrast  Result Date: 04/06/2022 CLINICAL DATA:  Recurrent abscess with communication with the pancreas. Patient states she had a pseudocyst on her pancreas that extended into the left flank with drainage. The patient became septic and was hospitalized for a month. EXAM: CT ABDOMEN AND PELVIS WITH CONTRAST TECHNIQUE: Multidetector CT imaging of the abdomen and pelvis was performed using the standard protocol following bolus administration of intravenous contrast. RADIATION DOSE REDUCTION: This exam was performed according to the departmental dose-optimization program which includes automated exposure control, adjustment of the mA and/or kV according  to patient size and/or use of iterative reconstruction technique. CONTRAST:  16m OMNIPAQUE IOHEXOL 300 MG/ML  SOLN COMPARISON:  CT scan of the abdomen and pelvis January 22, 2022 and March 26, 2022 FINDINGS: Lower chest: No acute abnormality. Hepatobiliary: The liver demonstrates cirrhotic changes with a nodular contour. No liver mass identified. Portal vein is patent. The patient is status post cholecystectomy. Prominence of the common bile duct and central intrahepatic ducts is likely from previous cholecystectomy and is stable. Pancreas: Pancreatic head, neck, and body are stable. A few calcifications in the pancreatic head are likely due to previous pancreatitis. There is fluid near the pancreatic tail which could be in the talar adjacent. Pancreas is otherwise stable. Spleen: The spleen is prominent but stable. Adrenals/Urinary Tract: Adrenal glands are unremarkable. Kidneys are normal, without renal calculi, focal lesion, or hydronephrosis. Bladder is unremarkable. Stomach/Bowel: The stomach and small bowel are normal. Moderate fecal loading in the colon. The colon is otherwise unremarkable. The appendix is unremarkable. Vascular/Lymphatic: Calcified atherosclerotic changes are  identified in the abdominal aorta, extending into the iliac and femoral vessels. No adenopathy. Splenic varices are noted. Reproductive: Uterus and bilateral adnexa are unremarkable. Other: There is moderate ascites in the abdomen adjacent to the liver and spleen. There is also significant ascites in the pelvis. The amount of ascites is similar since March 26, 2022. There is a large peripherally enhancing complex fluid collection. The collection extends from near the tail of the pancreas posteriorly and inferiorly through the left posterior musculature, X into the subcutaneous fat. The collection extends to the skin surface. Again, the margins are irregular and the collection is difficult to measure. The collection is significantly larger compared to March 26, 2022 1 only a small amount of fluid remain near the pancreatic tail. However, the collection is a little smaller than the January 22, 2022 study. Musculoskeletal: No acute or significant osseous findings. IMPRESSION: 1. There is a large peripherally enhancing complex fluid collection in the left abdomen extending from the tail of the pancreas posteriorly and inferiorly into the subcutaneous fat. The collection is significantly larger compared to March 26, 2022. However, the collection is a little smaller than the January 22, 2022 study. The collection extends to the skin surface. This collection may represent a pseudocyst given previous history. An abscess could have a similar appearance. Whether the collection is sterile or infected cannot be determined on this study. There is no obvious air in the collection. 2. Cirrhosis with evidence of portal venous hypertension including splenic varices and moderate ascites. 3. Moderate fecal loading in the colon. 4. Calcified atherosclerotic changes in the abdominal aorta, iliac vessels, and femoral vessels. 5. Aortic atherosclerosis. Aortic Atherosclerosis (ICD10-I70.0). Electronically Signed   By: DDorise BullionIII M.D.   On: 04/06/2022 18:29   CT Abdomen Pelvis W Contrast  Result Date: 03/27/2022 CLINICAL DATA:  Pancreatic pseudocyst.  Skip down down 02/21/2022 EXAM: CT ABDOMEN AND PELVIS WITH CONTRAST TECHNIQUE: Multidetector CT imaging of the abdomen and pelvis was performed using the standard protocol following bolus administration of intravenous contrast. RADIATION DOSE REDUCTION: This exam was performed according to the departmental dose-optimization program which includes automated exposure control, adjustment of the mA and/or kV according to patient size and/or use of iterative reconstruction technique. CONTRAST:  1058mOMNIPAQUE IOHEXOL 300 MG/ML  SOLN COMPARISON:  01/22/2022 FINDINGS: Lower chest: Unremarkable. Hepatobiliary: Nodular liver contour with heterogeneous enhancement. No discrete or focal mass lesion evident. Gallbladder is surgically absent. Stable prominence of the extrahepatic  bile ducts. Pancreas: No main duct dilatation. Cystic lesion in the region of the pancreatic tail is stable at 1.6 cm maximum dimension (32/3). Spleen: No splenomegaly. No focal mass lesion. Adrenals/Urinary Tract: No adrenal nodule or mass. Kidneys unremarkable. No evidence for hydroureter. The urinary bladder appears normal for the degree of distention. Stomach/Bowel: Stomach is distended with food, fluid, and contrast material. Duodenum is normally positioned as is the ligament of Treitz. No small bowel wall thickening. No small bowel dilatation. The terminal ileum is normal. The appendix is normal. No gross colonic mass. No colonic wall thickening. Vascular/Lymphatic: There is moderate atherosclerotic calcification of the abdominal aorta without aneurysm. There is no gastrohepatic or hepatoduodenal ligament lymphadenopathy. No retroperitoneal or mesenteric lymphadenopathy. Portal vein, superior mesenteric vein, and splenic vein are patent. Subtle paraesophageal varices evident. No pelvic sidewall  lymphadenopathy. Reproductive: The uterus is unremarkable.  There is no adnexal mass. Other: Moderate to large volume ascites identified in the abdomen and pelvis, similar to prior. Musculoskeletal: The complex rim enhancing fluid collections seen in left retroperitoneal space extending into the left flank region has essentially resolved in the interval. There is no residual rim enhancing fluid collection at this time although fat planes in the abdominal wall the left flank region remain obscured with some fluid or granulation in the anterior pararenal space. No worrisome lytic or sclerotic osseous abnormality. IMPRESSION: 1. The complex rim enhancing fluid collections seen in the left retroperitoneal space extending into the left flank region have essentially resolved in the interval. There is no residual rim enhancing fluid collection at this time although fat planes in the abdominal wall of the left flank region remain obscured with some edema or granulation in the anterior pararenal space. 2. Nodular liver contour with heterogeneous enhancement. Imaging features compatible with cirrhosis. No discrete or focal mass lesion evident. Subtle paraesophageal varices suggest portal venous hypertension. 3. Moderate to large volume ascites in the abdomen and pelvis, similar to prior. 4. Stable 1.6 cm cystic lesion in the region of the pancreatic tail. 5.  Aortic Atherosclerosis (ICD10-I70.0). Electronically Signed   By: Misty Stanley M.D.   On: 03/27/2022 10:06    Microbiology: Results for orders placed or performed during the hospital encounter of 04/06/22  Aerobic/Anaerobic Culture w Gram Stain (surgical/deep wound)     Status: None (Preliminary result)   Collection Time: 04/07/22  6:44 PM   Specimen: Fluid; Abscess  Result Value Ref Range Status   Specimen Description   Final    FLUID Performed at [facility] [address] [place], [place] [address]    Special Requests   Final     NONE Performed at [facility], [place] [address], [place], [place] [address]    Gram Stain   Final    FEW WBC PRESENT, PREDOMINANTLY PMN FEW GRAM NEGATIVE RODS Performed at [facility], [place] [address] [place], [place] [address]    Culture   Final    ABUNDANT KLEBSIELLA PNEUMONIAE NO ANAEROBES ISOLATED; CULTURE IN PROGRESS FOR 5 DAYS    Report Status PENDING  Incomplete   Organism ID, Bacteria KLEBSIELLA PNEUMONIAE  Final      Susceptibility   Klebsiella pneumoniae - MIC*    AMPICILLIN >=32 RESISTANT Resistant     CEFAZOLIN <=4 SENSITIVE Sensitive     CEFEPIME <=0.12 SENSITIVE Sensitive     CEFTAZIDIME <=1 SENSITIVE Sensitive     CEFTRIAXONE <=0.25 SENSITIVE Sensitive     CIPROFLOXACIN <=0.25 SENSITIVE Sensitive  GENTAMICIN <=1 SENSITIVE Sensitive     IMIPENEM <=0.25 SENSITIVE Sensitive     TRIMETH/SULFA <=20 SENSITIVE Sensitive     AMPICILLIN/SULBACTAM 8 SENSITIVE Sensitive     PIP/TAZO <=4 SENSITIVE Sensitive     * ABUNDANT KLEBSIELLA PNEUMONIAE  Culture, body fluid w Gram Stain-bottle     Status: None (Preliminary result)   Collection Time: 04/09/22  3:06 PM   Specimen: Peritoneal Washings  Result Value Ref Range Status   Specimen Description PERITONEAL  Final   Special Requests NONE  Final   Culture   Final    NO GROWTH < 24 HOURS Performed at [facility], [place] [address], [place], [place] [address]    Report Status PENDING  Incomplete   *Note: Due to a large number of results and/or encounters for the requested time period, some results have not been displayed. A complete set of results can be found in Results Review.   Labs: CBC: Recent Labs  Lab 04/06/22 1438 04/07/22 0220 04/08/22 0520 04/09/22 0438 04/10/22 0519  WBC 6.4 7.7 5.7 4.6 4.9  NEUTROABS 4.9  --  3.7 2.9 3.1  HGB 12.7 12.1 10.5* 11.8* 11.5*  HCT 39.0 35.9* 32.2* 36.0 35.5*  MCV 87.1 84.1 85.2 85.1 86.6  PLT 167 190 172 205 142   Basic Metabolic  Panel: Recent Labs  Lab 04/06/22 1438 04/07/22 0220 04/08/22 0520 04/09/22 0438 04/10/22 0519  NA 133* 130* 131* 134* 131*  K 3.7 4.2 3.7 4.0 4.0  CL 97* 97* 101 100 100  CO2 22 24 20* 25 21*  GLUCOSE 280* 243* 161* 137* 166*  BUN 35* 35* 31* 36* 31*  CREATININE 0.93 0.92 0.95 0.95 0.96  CALCIUM 9.6 9.2 8.5* 9.3 9.0  MG  --   --  1.6* 2.2 1.6*  PHOS  --   --  3.4 4.3 4.4   Liver Function Tests: Recent Labs  Lab 04/06/22 1438 04/08/22 0520 04/09/22 0438 04/10/22 0519  AST _0 ALT _1 ALKPHOS 299* 290* 279* 238*  BILITOT 1.0 0.8 0.6 0.5  PROT 8.3* 6.7 7.2 6.7  ALBUMIN 3.5 3.0* 3.3* 2.9*   CBG: Recent Labs  Lab 04/09/22 1131 04/09/22 1644 04/09/22 2130 04/10/22 0716 04/10/22 1129  GLUCAP 216* 197* 217* 182* 264*   Discharge time spent: greater than 30 minutes.  Signed: Raiford Noble, DO [facility] 04/10/2022
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
## 2022-04-11 ENCOUNTER — Ambulatory Visit: Payer: 59 | Admitting: Internal Medicine

## 2022-04-11 ENCOUNTER — Encounter: Payer: Self-pay | Admitting: Family Medicine

## 2022-04-11 ENCOUNTER — Ambulatory Visit (INDEPENDENT_AMBULATORY_CARE_PROVIDER_SITE_OTHER): Payer: 59 | Admitting: Family Medicine

## 2022-04-11 VITALS — BP 106/66 | HR 92 | Temp 97.9°F | Ht 68.5 in | Wt 144.1 lb

## 2022-04-11 DIAGNOSIS — S060X0A Concussion without loss of consciousness, initial encounter: Secondary | ICD-10-CM

## 2022-04-11 DIAGNOSIS — F0781 Postconcussional syndrome: Secondary | ICD-10-CM | POA: Diagnosis not present

## 2022-04-11 DIAGNOSIS — Z87820 Personal history of traumatic brain injury: Secondary | ICD-10-CM | POA: Diagnosis not present

## 2022-04-11 DIAGNOSIS — G44221 Chronic tension-type headache, intractable: Secondary | ICD-10-CM | POA: Diagnosis not present

## 2022-04-11 LAB — TOTAL BILIRUBIN, BODY FLUID

## 2022-04-11 NOTE — Patient Instructions (Signed)
To help reduce HEADACHES: Coenzyme Q10 160mg  ONCE DAILY Riboflavin/Vitamin B2 400mg  ONCE DAILY Magnesium oxide 400mg  ONCE - TWICE DAILY May stop after headaches are resolved.                                                                                               To help with INSOMNIA: Melatonin 5 - 10 mg AT BEDTIME Tart cherry extract, any dose at night (works well for gout)   Other medicines to help decrease inflammation Turmeric 500mg  once to twice daily

## 2022-04-12 LAB — LIPASE, FLUID
Lipase-Fluid: 8043 U/L
Lipase-Fluid: 7 U/L

## 2022-04-13 ENCOUNTER — Other Ambulatory Visit: Payer: Self-pay | Admitting: Internal Medicine

## 2022-04-13 LAB — AEROBIC/ANAEROBIC CULTURE W GRAM STAIN (SURGICAL/DEEP WOUND)

## 2022-04-14 LAB — CULTURE, BODY FLUID W GRAM STAIN -BOTTLE: Culture: NO GROWTH

## 2022-04-17 ENCOUNTER — Telehealth: Payer: Self-pay | Admitting: Family Medicine

## 2022-04-19 ENCOUNTER — Ambulatory Visit (INDEPENDENT_AMBULATORY_CARE_PROVIDER_SITE_OTHER): Payer: 59 | Admitting: Family Medicine

## 2022-04-19 ENCOUNTER — Encounter: Payer: Self-pay | Admitting: Family Medicine

## 2022-04-19 VITALS — BP 130/78 | HR 95 | Temp 97.2°F | Ht 68.5 in | Wt 146.4 lb

## 2022-04-19 DIAGNOSIS — L02211 Cutaneous abscess of abdominal wall: Secondary | ICD-10-CM

## 2022-04-19 DIAGNOSIS — K859 Acute pancreatitis without necrosis or infection, unspecified: Secondary | ICD-10-CM | POA: Diagnosis not present

## 2022-04-19 DIAGNOSIS — Z794 Long term (current) use of insulin: Secondary | ICD-10-CM | POA: Diagnosis not present

## 2022-04-19 DIAGNOSIS — E1159 Type 2 diabetes mellitus with other circulatory complications: Secondary | ICD-10-CM

## 2022-04-19 NOTE — Progress Notes (Signed)
Patient ID: Mallory Stewart, female    DOB: March 18, 1961, 61 y.o.   MRN: [id number]  This visit was conducted in person.  BP 130/78   Pulse 95   Temp (!) 97.2 F (36.2 C) (Temporal)   Ht 5' 8.5" (1.74 m)   Wt 146 lb 6.4 oz (66.4 kg)   SpO2 98%   BMI 21.94 kg/m    CC:  issue with device  Subjective:   HPI: Mallory Stewart is a 61 y.o. female past medical history significant for but not limited to hypertension, portal hypertension, history of gastric varices, chronic alcoholic pancreatitis with pseudocyst, history of alcoholic cirrhosis of the liver, diabetes mellitus type 2, hyperlipidemia, history of C. difficile colitis presenting on 04/19/2022 for No chief complaint on file.  Recent repeat ER visit to hospital admission from 12/1 to 12/5 For recurrent abscess of flank Of note she was hospitalized from 01/22/2022 until 01/29/2022 with sepsis secondary cellulitis and a large abscess collection.    General surgery did I&D on 01/05/2022 and aerobic and anaerobic culture showed Klebsiella pneumonia.  She was treated with Unasyn and discharged on Augmentin for 4 weeks per ID.  She is prescribed oral vancomycin for C. difficile prophylaxis.  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  She had repeat CT of the abdomen pelvis on 03/29/2022 showing complete resolution of the abscess and retroperitoneal space.   During recent hospitalization she was seen for recurrent flank pain and developing chills. CT scan of the abdomen pelvis showed large peripherally enhancing complex fluid collection extends from the tail of the pancreas posterior and evaluate through the left posterior musculature into the subcutaneous fat and skin surface.  There is moderate ascites in the abdomen adjacent to the liver and spleen.     She is now status post ultrasound guided drain placement and status post paracentesis. ID recommended IV Unasyn She was discharged on Augmentin for 6 weeks as well as double dosing of vancomycin for prophylaxis for C. difficile.  She was recommended to have repeat CBC, CMP, magnesium and phosphorus in 1 week postdischarge.  They also recommended follow-up with interventional radiology drain clinic and gastroenterology and general surgery.  Today she  has noted the " white wings"  from drain are coming loose. Concerned that when flushes 5 cc last night.. saw some fluid in receptacle but also some on bandage.  At discharge, fluid in bulb was about 20 cc and has steadily decreased.  Appt at drain MD next week Dec 19 IR, Ct scan prior.. plan removal of drain at that time if everything improving.   Concerned that the drain has .. still draining,   Relevant past medical, surgical, family and social history reviewed and updated as indicated. Interim medical history since our last visit reviewed. Allergies and medications reviewed and updated. Outpatient Medications Prior to Visit  Medication Sig Dispense Refill   ALPRAZolam (XANAX) 0.25 MG tablet Take 1 tablet (0.25 mg total) by mouth 2 (two) times daily as needed for anxiety. Please ignore other prescription as change made. 60 tablet 0   amoxicillin-clavulanate (AUGMENTIN) 875-125 MG tablet Take 1 tablet by mouth 2 (two) times daily. 84 tablet 0   Blood Glucose Monitoring Suppl (ONE TOUCH ULTRA 2) w/Device KIT Use to check blood sugars two times day 1 each  0   CALCIUM PO Take 1 tablet by mouth in the morning and at bedtime.     cetirizine (ZYRTEC) 10 MG tablet Take 10 mg by mouth at bedtime.     Cholecalciferol (VITAMIN D3 PO) Take 2 capsules by mouth in the morning and at bedtime.     Cyanocobalamin (VITAMIN B-12) 5000 MCG SUBL Place 5,000 mcg under the tongue daily.     folic acid (FOLVITE) 1 MG tablet Take 1 tablet (1 mg total) by mouth  daily. 30 tablet 0   furosemide (LASIX) 20 MG tablet Take 1 tablet (20 mg total) by mouth daily. 90 tablet 1   glucose blood (ONETOUCH ULTRA) test strip CHECK BLOOD SUGAR TWICE DAILY AS DIRECTED 100 strip 3   Insulin Glargine (BASAGLAR KWIKPEN) 100 UNIT/ML Inject 30 Units into the skin daily. 30 mL 3   insulin lispro (HUMALOG KWIKPEN) 100 UNIT/ML KwikPen Max daily 30 units (Patient taking differently: 8 Units See admin instructions. Inject 6 units into the skin three times a day with meals, PER SLIDING SCALE) 30 mL 3   Insulin Pen Needle 32G X 4 MM MISC 1 Device by Does not apply route in the morning, at noon, in the evening, and at bedtime. 400 each 3   Lancets (ONETOUCH ULTRASOFT) lancets Use to check blood sugar two times a day. 200 each 3   oxyCODONE (OXY IR/ROXICODONE) 5 MG immediate release tablet Take 1 tablet (5 mg total) by mouth every 6 (six) hours as needed for moderate pain. 10 tablet 0   sodium chloride flush 0.9 % SOLN injection 5 mLs by Intracatheter route daily. 100 mL 1   spironolactone (ALDACTONE) 50 MG tablet Take 1 tablet (50 mg total) by mouth daily. 90 tablet 1   traZODone (DESYREL) 100 MG tablet Take 0.5-1 tablets (50-100 mg total) by mouth at bedtime. 30 tablet 2   vancomycin (VANCOCIN) 125 MG capsule Take 1 capsule (125 mg total) by mouth 2 (two) times daily. 84 capsule 0   No facility-administered medications prior to visit.     Per HPI unless specifically indicated in ROS section below Review of Systems  Constitutional:  Negative for fatigue and fever.  HENT:  Negative for congestion.   Eyes:  Negative for pain.  Respiratory:  Negative for cough and shortness of breath.   Cardiovascular:  Negative for chest pain, palpitations and leg swelling.  Gastrointestinal:  Negative for abdominal pain.  Genitourinary:  Negative for dysuria and vaginal bleeding.  Musculoskeletal:  Negative for back pain.  Neurological:  Negative for syncope, light-headedness and headaches.   Psychiatric/Behavioral:  Negative for dysphoric mood.    Objective:  BP 130/78   Pulse 95   Temp (!) 97.2 F (36.2 C) (Temporal)   Ht 5' 8.5" (1.74 m)   Wt 146 lb 6.4 oz (66.4 kg)   SpO2 98%   BMI 21.94 kg/m   Wt Readings from Last 3 Encounters:  04/19/22 146 lb 6.4 oz (66.4 kg)  04/11/22 144 lb 2 oz (65.4 kg)  04/07/22 140 lb 14 oz (63.9 kg)      Physical Exam Constitutional:      General: She is not in acute distress.    Appearance: Normal appearance. She is well-developed. She is cachectic. She is not ill-appearing or toxic-appearing.  HENT:     Head: Normocephalic.     Right Ear: Hearing, tympanic membrane, ear canal and external ear normal. Tympanic membrane is not erythematous, retracted or bulging.  Left Ear: Hearing, tympanic membrane, ear canal and external ear normal. Tympanic membrane is not erythematous, retracted or bulging.     Nose: No mucosal edema or rhinorrhea.     Right Sinus: No maxillary sinus tenderness or frontal sinus tenderness.     Left Sinus: No maxillary sinus tenderness or frontal sinus tenderness.     Mouth/Throat:     Pharynx: Uvula midline.  Eyes:     General: Lids are normal. Lids are everted, no foreign bodies appreciated.     Conjunctiva/sclera: Conjunctivae normal.     Pupils: Pupils are equal, round, and reactive to light.  Neck:     Thyroid: No thyroid mass or thyromegaly.     Vascular: No carotid bruit.     Trachea: Trachea normal.  Cardiovascular:     Rate and Rhythm: Normal rate and regular rhythm.     Pulses: Normal pulses.     Heart sounds: Normal heart sounds, S1 normal and S2 normal. No murmur heard.    No friction rub. No gallop.  Pulmonary:     Effort: Pulmonary effort is normal. No tachypnea or respiratory distress.     Breath sounds: Normal breath sounds. No decreased breath sounds, wheezing, rhonchi or rales.  Abdominal:     General: Bowel sounds are normal.     Palpations: Abdomen is soft.     Tenderness: There  is no abdominal tenderness.  Musculoskeletal:     Cervical back: Normal range of motion and neck supple.  Skin:    General: Skin is warm and dry.     Findings: No rash.     Comments: Bandage on the left flank attached without surrounding erythema beyond bandage.  Neurological:     Mental Status: She is alert.  Psychiatric:        Mood and Affect: Mood is not anxious or depressed.        Speech: Speech normal.        Behavior: Behavior normal. Behavior is cooperative.        Thought Content: Thought content normal.        Judgment: Judgment normal.       Results for orders placed or performed during the hospital encounter of 04/06/22  Aerobic/Anaerobic Culture w Gram Stain (surgical/deep wound)   Specimen: Fluid; Abscess  Result Value Ref Range   Specimen Description      FLUID Performed at [facility] [address], [place], [place] [address]    Special Requests      NONE Performed at [facility], [place] [address] [place], [place] [address]    Gram Stain      FEW WBC PRESENT, PREDOMINANTLY PMN FEW GRAM NEGATIVE RODS    Culture      ABUNDANT KLEBSIELLA PNEUMONIAE NO ANAEROBES ISOLATED Performed at [facility], [place] [address] [place], [place] [address]    Report Status 04/13/2022 FINAL    Organism ID, Bacteria KLEBSIELLA PNEUMONIAE       Susceptibility   Klebsiella pneumoniae - MIC*    AMPICILLIN >=32 RESISTANT Resistant     CEFAZOLIN <=4 SENSITIVE Sensitive     CEFEPIME <=0.12 SENSITIVE Sensitive     CEFTAZIDIME <=1 SENSITIVE Sensitive     CEFTRIAXONE <=0.25 SENSITIVE Sensitive     CIPROFLOXACIN <=0.25 SENSITIVE Sensitive     GENTAMICIN <=1 SENSITIVE Sensitive     IMIPENEM <=0.25 SENSITIVE Sensitive     TRIMETH/SULFA <=20 SENSITIVE Sensitive     AMPICILLIN/SULBACTAM 8  SENSITIVE Sensitive     PIP/TAZO <=4 SENSITIVE Sensitive     * ABUNDANT KLEBSIELLA PNEUMONIAE  Culture, body fluid w Gram Stain-bottle   Specimen:  Peritoneal Washings  Result Value Ref Range   Specimen Description PERITONEAL    Special Requests NONE    Culture      NO GROWTH 5 DAYS Performed at [facility], [address] [place], [place] [address]    Report Status 04/14/2022 FINAL   Fungus Culture Result  Result Value Ref Range   Result 1 Comment   Comprehensive metabolic panel  Result Value Ref Range   Sodium 133 (L) 135 - 145 mmol/L   Potassium 3.7 3.5 - 5.1 mmol/L   Chloride 97 (L) 98 - 111 mmol/L   CO2 22 22 - 32 mmol/L   Glucose, Bld 280 (H) 70 - 99 mg/dL   BUN 35 (H) 6 - 20 mg/dL   Creatinine, Ser 0.93 0.44 - 1.00 mg/dL   Calcium 9.6 8.9 - 10.3 mg/dL   Total Protein 8.3 (H) 6.5 - 8.1 g/dL   Albumin 3.5 3.5 - 5.0 g/dL   AST 27 15 - 41 U/L   ALT 24 0 - 44 U/L   Alkaline Phosphatase 299 (H) 38 - 126 U/L   Total Bilirubin 1.0 0.3 - 1.2 mg/dL   GFR, Estimated >60 >60 mL/min   Anion gap 14 5 - 15  CBC with Differential  Result Value Ref Range   WBC 6.4 4.0 - 10.5 K/uL   RBC 4.48 3.87 - 5.11 MIL/uL   Hemoglobin 12.7 12.0 - 15.0 g/dL   HCT 39.0 36.0 - 46.0 %   MCV 87.1 80.0 - 100.0 fL   MCH 28.3 26.0 - 34.0 pg   MCHC 32.6 30.0 - 36.0 g/dL   RDW 13.5 11.5 - 15.5 %   Platelets 167 150 - 400 K/uL   nRBC 0.0 0.0 - 0.2 %   Neutrophils Relative % 76 %   Neutro Abs 4.9 1.7 - 7.7 K/uL   Lymphocytes Relative 12 %   Lymphs Abs 0.8 0.7 - 4.0 K/uL   Monocytes Relative 10 %   Monocytes Absolute 0.7 0.1 - 1.0 K/uL   Eosinophils Relative 1 %   Eosinophils Absolute 0.0 0.0 - 0.5 K/uL   Basophils Relative 0 %   Basophils Absolute 0.0 0.0 - 0.1 K/uL   Immature Granulocytes 1 %   Abs Immature Granulocytes 0.03 0.00 - 0.07 K/uL  Lipase, blood  Result Value Ref Range   Lipase 34 11 - 51 U/L  CBC  Result Value Ref Range   WBC 7.7 4.0 - 10.5 K/uL   RBC 4.27 3.87 - 5.11 MIL/uL   Hemoglobin 12.1 12.0 - 15.0 g/dL   HCT 35.9 (L) 36.0 - 46.0 %   MCV 84.1 80.0 - 100.0 fL   MCH 28.3 26.0 - 34.0 pg   MCHC 33.7 30.0 -  36.0 g/dL   RDW 13.5 11.5 - 15.5 %   Platelets 190 150 - 400 K/uL   nRBC 0.0 0.0 - 0.2 %  Basic metabolic panel  Result Value Ref Range   Sodium 130 (L) 135 - 145 mmol/L   Potassium 4.2 3.5 - 5.1 mmol/L   Chloride 97 (L) 98 - 111 mmol/L   CO2 24 22 - 32 mmol/L   Glucose, Bld 243 (H) 70 - 99 mg/dL   BUN 35 (H) 6 - 20 mg/dL   Creatinine, Ser 0.92 0.44 - 1.00 mg/dL  Calcium 9.2 8.9 - 10.3 mg/dL   GFR, Estimated >60 >60 mL/min   Anion gap 9 5 - 15  CBC with Differential/Platelet  Result Value Ref Range   WBC 5.7 4.0 - 10.5 K/uL   RBC 3.78 (L) 3.87 - 5.11 MIL/uL   Hemoglobin 10.5 (L) 12.0 - 15.0 g/dL   HCT 32.2 (L) 36.0 - 46.0 %   MCV 85.2 80.0 - 100.0 fL   MCH 27.8 26.0 - 34.0 pg   MCHC 32.6 30.0 - 36.0 g/dL   RDW 13.4 11.5 - 15.5 %   Platelets 172 150 - 400 K/uL   nRBC 0.0 0.0 - 0.2 %   Neutrophils Relative % 64 %   Neutro Abs 3.7 1.7 - 7.7 K/uL   Lymphocytes Relative 21 %   Lymphs Abs 1.2 0.7 - 4.0 K/uL   Monocytes Relative 12 %   Monocytes Absolute 0.7 0.1 - 1.0 K/uL   Eosinophils Relative 2 %   Eosinophils Absolute 0.1 0.0 - 0.5 K/uL   Basophils Relative 1 %   Basophils Absolute 0.0 0.0 - 0.1 K/uL   Immature Granulocytes 0 %   Abs Immature Granulocytes 0.02 0.00 - 0.07 K/uL  Comprehensive metabolic panel  Result Value Ref Range   Sodium 131 (L) 135 - 145 mmol/L   Potassium 3.7 3.5 - 5.1 mmol/L   Chloride 101 98 - 111 mmol/L   CO2 20 (L) 22 - 32 mmol/L   Glucose, Bld 161 (H) 70 - 99 mg/dL   BUN 31 (H) 6 - 20 mg/dL   Creatinine, Ser 0.95 0.44 - 1.00 mg/dL   Calcium 8.5 (L) 8.9 - 10.3 mg/dL   Total Protein 6.7 6.5 - 8.1 g/dL   Albumin 3.0 (L) 3.5 - 5.0 g/dL   AST 23 15 - 41 U/L   ALT 20 0 - 44 U/L   Alkaline Phosphatase 290 (H) 38 - 126 U/L   Total Bilirubin 0.8 0.3 - 1.2 mg/dL   GFR, Estimated >60 >60 mL/min   Anion gap 10 5 - 15  Phosphorus  Result Value Ref Range   Phosphorus 3.4 2.5 - 4.6 mg/dL  Magnesium  Result Value Ref Range   Magnesium 1.6 (L) 1.7  - 2.4 mg/dL  Glucose, capillary  Result Value Ref Range   Glucose-Capillary 106 (H) 70 - 99 mg/dL  Glucose, capillary  Result Value Ref Range   Glucose-Capillary 227 (H) 70 - 99 mg/dL  Glucose, capillary  Result Value Ref Range   Glucose-Capillary 159 (H) 70 - 99 mg/dL   Comment 1 Notify RN   Glucose, capillary  Result Value Ref Range   Glucose-Capillary 291 (H) 70 - 99 mg/dL   Comment 1 Notify RN   CBC with Differential/Platelet  Result Value Ref Range   WBC 4.6 4.0 - 10.5 K/uL   RBC 4.23 3.87 - 5.11 MIL/uL   Hemoglobin 11.8 (L) 12.0 - 15.0 g/dL   HCT 36.0 36.0 - 46.0 %   MCV 85.1 80.0 - 100.0 fL   MCH 27.9 26.0 - 34.0 pg   MCHC 32.8 30.0 - 36.0 g/dL   RDW 13.3 11.5 - 15.5 %   Platelets 205 150 - 400 K/uL   nRBC 0.0 0.0 - 0.2 %   Neutrophils Relative % 63 %   Neutro Abs 2.9 1.7 - 7.7 K/uL   Lymphocytes Relative 25 %   Lymphs Abs 1.2 0.7 - 4.0 K/uL   Monocytes Relative 8 %   Monocytes Absolute 0.4 0.1 -  1.0 K/uL   Eosinophils Relative 2 %   Eosinophils Absolute 0.1 0.0 - 0.5 K/uL   Basophils Relative 1 %   Basophils Absolute 0.0 0.0 - 0.1 K/uL   Immature Granulocytes 1 %   Abs Immature Granulocytes 0.03 0.00 - 0.07 K/uL  Comprehensive metabolic panel  Result Value Ref Range   Sodium 134 (L) 135 - 145 mmol/L   Potassium 4.0 3.5 - 5.1 mmol/L   Chloride 100 98 - 111 mmol/L   CO2 25 22 - 32 mmol/L   Glucose, Bld 137 (H) 70 - 99 mg/dL   BUN 36 (H) 6 - 20 mg/dL   Creatinine, Ser 0.95 0.44 - 1.00 mg/dL   Calcium 9.3 8.9 - 10.3 mg/dL   Total Protein 7.2 6.5 - 8.1 g/dL   Albumin 3.3 (L) 3.5 - 5.0 g/dL   AST 23 15 - 41 U/L   ALT 17 0 - 44 U/L   Alkaline Phosphatase 279 (H) 38 - 126 U/L   Total Bilirubin 0.6 0.3 - 1.2 mg/dL   GFR, Estimated >60 >60 mL/min   Anion gap 9 5 - 15  Magnesium  Result Value Ref Range   Magnesium 2.2 1.7 - 2.4 mg/dL  Phosphorus  Result Value Ref Range   Phosphorus 4.3 2.5 - 4.6 mg/dL  Vitamin B12  Result Value Ref Range   Vitamin B-12  3,428 (H) 180 - 914 pg/mL  Folate  Result Value Ref Range   Folate 4.8 (L) >5.9 ng/mL  Iron and TIBC  Result Value Ref Range   Iron 43 28 - 170 ug/dL   TIBC 307 250 - 450 ug/dL   Saturation Ratios 14 10.4 - 31.8 %   UIBC 264 ug/dL  Ferritin  Result Value Ref Range   Ferritin 98 11 - 307 ng/mL  Reticulocytes  Result Value Ref Range   Retic Ct Pct 1.1 0.4 - 3.1 %   RBC. 4.14 3.87 - 5.11 MIL/uL   Retic Count, Absolute 46.8 19.0 - 186.0 K/uL   Immature Retic Fract 11.5 2.3 - 15.9 %  Glucose, capillary  Result Value Ref Range   Glucose-Capillary 213 (H) 70 - 99 mg/dL   Comment 1 Notify RN   Glucose, capillary  Result Value Ref Range   Glucose-Capillary 187 (H) 70 - 99 mg/dL  Glucose, capillary  Result Value Ref Range   Glucose-Capillary 156 (H) 70 - 99 mg/dL  Amylase  Result Value Ref Range   Amylase 29 28 - 100 U/L  Amylase, Body Fluid (other)  Result Value Ref Range   Source of Sample JP DRAINAGE    Amylase, Body Fluid 1,269 U/L  Lipase, Fluid  Result Value Ref Range   Lipase-Fluid 8,043 U/L   Source of Sample JP DRAINAGE   Glucose, capillary  Result Value Ref Range   Glucose-Capillary 216 (H) 70 - 99 mg/dL  Glucose, capillary  Result Value Ref Range   Glucose-Capillary 197 (H) 70 - 99 mg/dL  CBC with Differential/Platelet  Result Value Ref Range   WBC 4.9 4.0 - 10.5 K/uL   RBC 4.10 3.87 - 5.11 MIL/uL   Hemoglobin 11.5 (L) 12.0 - 15.0 g/dL   HCT 35.5 (L) 36.0 - 46.0 %   MCV 86.6 80.0 - 100.0 fL   MCH 28.0 26.0 - 34.0 pg   MCHC 32.4 30.0 - 36.0 g/dL   RDW 13.3 11.5 - 15.5 %   Platelets 209 150 - 400 K/uL   nRBC 0.0 0.0 - 0.2 %  Neutrophils Relative % 63 %   Neutro Abs 3.1 1.7 - 7.7 K/uL   Lymphocytes Relative 23 %   Lymphs Abs 1.2 0.7 - 4.0 K/uL   Monocytes Relative 9 %   Monocytes Absolute 0.5 0.1 - 1.0 K/uL   Eosinophils Relative 3 %   Eosinophils Absolute 0.2 0.0 - 0.5 K/uL   Basophils Relative 1 %   Basophils Absolute 0.1 0.0 - 0.1 K/uL   Immature  Granulocytes 1 %   Abs Immature Granulocytes 0.04 0.00 - 0.07 K/uL  Comprehensive metabolic panel  Result Value Ref Range   Sodium 131 (L) 135 - 145 mmol/L   Potassium 4.0 3.5 - 5.1 mmol/L   Chloride 100 98 - 111 mmol/L   CO2 21 (L) 22 - 32 mmol/L   Glucose, Bld 166 (H) 70 - 99 mg/dL   BUN 31 (H) 6 - 20 mg/dL   Creatinine, Ser 0.96 0.44 - 1.00 mg/dL   Calcium 9.0 8.9 - 10.3 mg/dL   Total Protein 6.7 6.5 - 8.1 g/dL   Albumin 2.9 (L) 3.5 - 5.0 g/dL   AST 19 15 - 41 U/L   ALT 15 0 - 44 U/L   Alkaline Phosphatase 238 (H) 38 - 126 U/L   Total Bilirubin 0.5 0.3 - 1.2 mg/dL   GFR, Estimated >60 >60 mL/min   Anion gap 10 5 - 15  Phosphorus  Result Value Ref Range   Phosphorus 4.4 2.5 - 4.6 mg/dL  Magnesium  Result Value Ref Range   Magnesium 1.6 (L) 1.7 - 2.4 mg/dL  Glucose, capillary  Result Value Ref Range   Glucose-Capillary 217 (H) 70 - 99 mg/dL  Glucose, capillary  Result Value Ref Range   Glucose-Capillary 182 (H) 70 - 99 mg/dL  Glucose, capillary  Result Value Ref Range   Glucose-Capillary 264 (H) 70 - 99 mg/dL  CBG monitoring, ED  Result Value Ref Range   Glucose-Capillary 300 (H) 70 - 99 mg/dL  CBG monitoring, ED  Result Value Ref Range   Glucose-Capillary 226 (H) 70 - 99 mg/dL   Comment 1 Notify RN   CBG monitoring, ED  Result Value Ref Range   Glucose-Capillary 185 (H) 70 - 99 mg/dL   Comment 1 Notify RN   CBG monitoring, ED  Result Value Ref Range   Glucose-Capillary 129 (H) 70 - 99 mg/dL   *Note: Due to a large number of results and/or encounters for the requested time period, some results have not been displayed. A complete set of results can be found in Results Review.     COVID 19 screen:  No recent travel or known exposure to COVID19 The patient denies respiratory symptoms of COVID 19 at this time. The importance of social distancing was discussed today.   Assessment and Plan   Abscess of flank Assessment & Plan: Acute, drain issues currently.   Contacted IR on-call doctor for recommendations given attempted flushing and office results in sterile saline leaking down patient's back. IR MD stated it was okay to stop flushing at home and to keep the appointment next week with interventional radiology for CT scan with plan of removal of drain.  No current red flags for worsening/recurrent infection such as fever, redness spreading or puslike discharge.  Assisted patient with confirming drain to and bandaged attached correctly.   Type 2 diabetes mellitus with other circulatory complication, with long-term current use of insulin (HCC) Assessment & Plan: Chronic, increased risk for infection and wound healing complications.  Pancreatic abscess     Eliezer Lofts, MD

## 2022-04-23 ENCOUNTER — Telehealth: Payer: Self-pay | Admitting: Family Medicine

## 2022-04-23 ENCOUNTER — Ambulatory Visit: Payer: 59 | Admitting: Infectious Disease

## 2022-04-23 DIAGNOSIS — K863 Pseudocyst of pancreas: Secondary | ICD-10-CM

## 2022-04-23 DIAGNOSIS — E871 Hypo-osmolality and hyponatremia: Secondary | ICD-10-CM

## 2022-04-23 NOTE — Telephone Encounter (Signed)
-----   Message from Ronalee Red, RT sent at 04/17/2022 11:23 AM EST ----- Regarding: Wed 12/20 lab Patient is coming for a lab only appt on 12/20.  On 03/22/22, you ordered a BMET panel.  I wanted to make sure that you don't need any additional labs.  Thanks, [name]

## 2022-04-24 ENCOUNTER — Ambulatory Visit
Admission: RE | Admit: 2022-04-24 | Discharge: 2022-04-24 | Disposition: A | Discharge status: Home / Self Care | Payer: 59 | Source: Ambulatory Visit | Attending: Physician Assistant | Admitting: Physician Assistant

## 2022-04-24 ENCOUNTER — Ambulatory Visit
Admission: RE | Admit: 2022-04-24 | Discharge: 2022-04-24 | Disposition: A | Discharge status: Home / Self Care | Payer: 59 | Source: Ambulatory Visit | Attending: Internal Medicine | Admitting: Internal Medicine

## 2022-04-24 DIAGNOSIS — L02211 Cutaneous abscess of abdominal wall: Secondary | ICD-10-CM

## 2022-04-24 HISTORY — PX: IR RADIOLOGIST EVAL & MGMT: IMG5224

## 2022-04-24 MED ORDER — IOPAMIDOL (ISOVUE-300) INJECTION 61%
75.0000 mL | Freq: Once | INTRAVENOUS | Status: AC | PRN
  Administered 2022-04-24: 75 mL via INTRAVENOUS

## 2022-04-24 NOTE — Progress Notes (Signed)
IR brief note   Patient presents today to the interventional radiology clinic for drain follow-up.  The patient is status post placement of a percutaneous drainage catheter for treatment of a left flank fluid collection on April 07, 2022.  CT scan today demonstrates near-complete resolution of the fluid collection.  Patient reports consistent low output (less than 5-10 mL daily) through the drain over the last 5-6 days.  Decision made to proceed with drain removal.    The left flank percutaneous drainage catheter was successfully removed.  Wound care instructions provided.    Olive Bass, MD  Vascular and Interventional Radiology 04/24/2022 3:21 PM

## 2022-04-25 ENCOUNTER — Other Ambulatory Visit (INDEPENDENT_AMBULATORY_CARE_PROVIDER_SITE_OTHER): Payer: 59

## 2022-04-25 DIAGNOSIS — E871 Hypo-osmolality and hyponatremia: Secondary | ICD-10-CM

## 2022-04-25 DIAGNOSIS — K863 Pseudocyst of pancreas: Secondary | ICD-10-CM | POA: Diagnosis not present

## 2022-04-25 DIAGNOSIS — Z1211 Encounter for screening for malignant neoplasm of colon: Secondary | ICD-10-CM

## 2022-04-25 LAB — COMPREHENSIVE METABOLIC PANEL
ALT: 37 U/L — ABNORMAL HIGH (ref 0–35)
AST: 61 U/L — ABNORMAL HIGH (ref 0–37)
Albumin: 4 g/dL (ref 3.5–5.2)
Alkaline Phosphatase: 258 U/L — ABNORMAL HIGH (ref 39–117)
BUN: 25 mg/dL — ABNORMAL HIGH (ref 6–23)
CO2: 28 mEq/L (ref 19–32)
Calcium: 9.7 mg/dL (ref 8.4–10.5)
Chloride: 100 mEq/L (ref 96–112)
Creatinine, Ser: 0.92 mg/dL (ref 0.40–1.20)
GFR: 68.28 mL/min (ref >60.00)
Glucose, Bld: 142 mg/dL — ABNORMAL HIGH (ref 70–99)
Potassium: 4.9 mEq/L (ref 3.5–5.1)
Sodium: 135 mEq/L (ref 135–145)
Total Bilirubin: 0.6 mg/dL (ref 0.2–1.2)
Total Protein: 7.6 g/dL (ref 6.0–8.3)

## 2022-04-25 LAB — CBC WITH DIFFERENTIAL/PLATELET
Basophils Absolute: 0 10*3/uL (ref 0.0–0.1)
Basophils Relative: 0.7 % (ref 0.0–3.0)
Eosinophils Absolute: 0.1 10*3/uL (ref 0.0–0.7)
Eosinophils Relative: 1.9 % (ref 0.0–5.0)
HCT: 37.7 % (ref 36.0–46.0)
Hemoglobin: 12.7 g/dL (ref 12.0–15.0)
Lymphocytes Relative: 20.3 % (ref 12.0–46.0)
Lymphs Abs: 1.1 10*3/uL (ref 0.7–4.0)
MCHC: 33.7 g/dL (ref 30.0–36.0)
MCV: 83.9 fl (ref 78.0–100.0)
Monocytes Absolute: 0.4 10*3/uL (ref 0.1–1.0)
Monocytes Relative: 7.2 % (ref 3.0–12.0)
Neutro Abs: 3.6 10*3/uL (ref 1.4–7.7)
Neutrophils Relative %: 69.9 % (ref 43.0–77.0)
Platelets: 195 10*3/uL (ref 150.0–400.0)
RBC: 4.5 Mil/uL (ref 3.87–5.11)
RDW: 13.9 % (ref 11.5–15.5)
WBC: 5.2 10*3/uL (ref 4.0–10.5)

## 2022-04-25 LAB — BASIC METABOLIC PANEL
BUN: 25 mg/dL — ABNORMAL HIGH (ref 6–23)
CO2: 28 mEq/L (ref 19–32)
Calcium: 9.7 mg/dL (ref 8.4–10.5)
Chloride: 100 mEq/L (ref 96–112)
Creatinine, Ser: 0.92 mg/dL (ref 0.40–1.20)
GFR: 68.28 mL/min (ref >60.00)
Glucose, Bld: 142 mg/dL — ABNORMAL HIGH (ref 70–99)
Potassium: 4.9 mEq/L (ref 3.5–5.1)
Sodium: 135 mEq/L (ref 135–145)

## 2022-04-25 LAB — MAGNESIUM: Magnesium: 1.6 mg/dL (ref 1.5–2.5)

## 2022-04-25 LAB — PHOSPHORUS: Phosphorus: 3.7 mg/dL (ref 2.3–4.6)

## 2022-04-25 NOTE — Addendum Note (Signed)
Addended by: Donnamarie Poag on: 04/25/2022 09:42 AM   Modules accepted: Orders
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
## 2022-04-26 ENCOUNTER — Encounter: Payer: Self-pay | Admitting: Infectious Disease

## 2022-04-26 ENCOUNTER — Other Ambulatory Visit: Payer: Self-pay

## 2022-04-26 ENCOUNTER — Ambulatory Visit (INDEPENDENT_AMBULATORY_CARE_PROVIDER_SITE_OTHER): Payer: 59 | Admitting: Infectious Disease

## 2022-04-26 VITALS — BP 130/88 | HR 85 | Temp 97.6°F | Ht 69.0 in | Wt 145.0 lb

## 2022-04-26 DIAGNOSIS — K860 Alcohol-induced chronic pancreatitis: Secondary | ICD-10-CM | POA: Diagnosis not present

## 2022-04-26 DIAGNOSIS — K8689 Other specified diseases of pancreas: Secondary | ICD-10-CM

## 2022-04-26 DIAGNOSIS — K7031 Alcoholic cirrhosis of liver with ascites: Secondary | ICD-10-CM

## 2022-04-26 DIAGNOSIS — K863 Pseudocyst of pancreas: Secondary | ICD-10-CM

## 2022-04-26 DIAGNOSIS — L02211 Cutaneous abscess of abdominal wall: Secondary | ICD-10-CM | POA: Diagnosis not present

## 2022-04-26 NOTE — Assessment & Plan Note (Signed)
Chronic, well-controlled  Lasix 40 mg daily, spironolactone 100 mg daily. Given low sodium GI recommended decreasing spironolactone to 50 mg daily.

## 2022-04-26 NOTE — Assessment & Plan Note (Signed)
Acute, now resolved following hospitalization.  Followed by ID on extended antibiotics

## 2022-04-26 NOTE — Assessment & Plan Note (Signed)
Chronic, moderate control with recent health issues  Using alprazolam 2-3 a week.

## 2022-04-26 NOTE — Assessment & Plan Note (Signed)
Chronic, followed by endocrinology

## 2022-04-26 NOTE — Assessment & Plan Note (Signed)
Followed by GI

## 2022-04-28 LAB — CULTURE, FUNGUS WITHOUT SMEAR

## 2022-05-04 ENCOUNTER — Encounter: Payer: Self-pay | Admitting: Internal Medicine

## 2022-05-04 NOTE — Telephone Encounter (Signed)
Error
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
## 2022-05-08 LAB — FUNGUS CULTURE WITH STAIN

## 2022-05-08 LAB — FUNGAL ORGANISM REFLEX

## 2022-05-08 LAB — FUNGUS CULTURE RESULT

## 2022-05-09 ENCOUNTER — Ambulatory Visit: Payer: 59 | Admitting: Family Medicine

## 2022-05-09 ENCOUNTER — Ambulatory Visit (INDEPENDENT_AMBULATORY_CARE_PROVIDER_SITE_OTHER): Payer: 59 | Admitting: Family Medicine

## 2022-05-09 ENCOUNTER — Telehealth: Payer: Self-pay | Admitting: Internal Medicine

## 2022-05-09 ENCOUNTER — Encounter: Payer: Self-pay | Admitting: Family Medicine

## 2022-05-09 VITALS — BP 110/70 | HR 68 | Temp 98.2°F | Ht 68.5 in | Wt 147.0 lb

## 2022-05-09 DIAGNOSIS — G44309 Post-traumatic headache, unspecified, not intractable: Secondary | ICD-10-CM | POA: Diagnosis not present

## 2022-05-09 DIAGNOSIS — F0781 Postconcussional syndrome: Secondary | ICD-10-CM | POA: Diagnosis not present

## 2022-05-09 MED ORDER — NORTRIPTYLINE HCL 25 MG PO CAPS: 25.0000 mg | ORAL_CAPSULE | Freq: Every day | ORAL | 3 refills | Status: DC

## 2022-05-09 NOTE — Telephone Encounter (Signed)
Pt  stated she received a call from her insurance company. They told her that her CT scan had been denied and it would not be covered because it was medically unnecessary. Insurance stated they needed more information on why the CT was ordered in order to approve it. Please advise.   Pt also stated that Dr. Carlean Purl wanted to communicate with her on the week of the January  8th: Pt stated that she did not know if that a phone conversation would be sufficient or does she need to schedule an office visit:  Please advise

## 2022-05-09 NOTE — Telephone Encounter (Signed)
Patient called, stated she received a call from her insurance company. They told her that her CT scan had been denied and it would not be covered because it was medically unnecessary. Insurance stated they needed more information on why the CT was ordered in order to approve it. Please advise.

## 2022-05-10 ENCOUNTER — Inpatient Hospital Stay: Payer: 59 | Admitting: Internal Medicine

## 2022-05-10 ENCOUNTER — Other Ambulatory Visit (INDEPENDENT_AMBULATORY_CARE_PROVIDER_SITE_OTHER): Payer: 59

## 2022-05-10 DIAGNOSIS — E871 Hypo-osmolality and hyponatremia: Secondary | ICD-10-CM

## 2022-05-10 LAB — BASIC METABOLIC PANEL
BUN: 33 mg/dL — ABNORMAL HIGH (ref 6–23)
CO2: 26 mEq/L (ref 19–32)
Calcium: 10 mg/dL (ref 8.4–10.5)
Chloride: 98 mEq/L (ref 96–112)
Creatinine, Ser: 0.93 mg/dL (ref 0.40–1.20)
GFR: 67.38 mL/min (ref >60.00)
Glucose, Bld: 123 mg/dL — ABNORMAL HIGH (ref 70–99)
Potassium: 4.9 mEq/L (ref 3.5–5.1)
Sodium: 133 mEq/L — ABNORMAL LOW (ref 135–145)

## 2022-05-14 NOTE — Telephone Encounter (Signed)
Pt made aware of Dr. Carlean Purl recommendations: Pt was scheduled for 05/22/2022 at 11:30 with Dr. Carlean Purl: Pt verbalized understanding with all questions answered.

## 2022-05-14 NOTE — Telephone Encounter (Signed)
Please see if she could come today at 350?

## 2022-05-14 NOTE — Telephone Encounter (Signed)
Pt stated that she cannot make the 3:50 appointment today due to having company but any other day this week would be great: Please advise

## 2022-05-14 NOTE — Telephone Encounter (Signed)
Left message for pt to call back  °

## 2022-05-14 NOTE — Telephone Encounter (Signed)
Looks like it will have to be next week - 1/16 1130 is open and any other clinic day that week 1130 is ok

## 2022-05-15 ENCOUNTER — Telehealth: Payer: Self-pay | Admitting: Family Medicine

## 2022-05-15 NOTE — Telephone Encounter (Signed)
Can you get her to double the dose and call.  With post-concussive headache, nothing will be a perfect cure, but they should get better with more time.

## 2022-05-15 NOTE — Telephone Encounter (Signed)
Please call:  Can you get her to double the dose.   With post-concussive headache, nothing will be a perfect cure, but they should get better with more time.

## 2022-05-15 NOTE — Addendum Note (Signed)
Addended by: Carter Kitten on: 05/15/2022 02:10 PM   Modules accepted: Orders

## 2022-05-15 NOTE — Telephone Encounter (Signed)
And Call??

## 2022-05-15 NOTE — Telephone Encounter (Signed)
Mallory Stewart notified as instructed by telephone.   Patient states understanding.  Medication list updated.

## 2022-05-15 NOTE — Telephone Encounter (Signed)
Pt called to let Dr. Lorelei Pont know the meds, nortriptyline (PAMELOR) 25 MG capsule, he prescribed for her headaches, aren't working at all. Pt states she still has the headaches often. Pt is asking should the dosage be increased or should be prescribed something else? Pt is asking for advice. Call back # [phone]

## 2022-05-21 MED ORDER — NORTRIPTYLINE HCL 50 MG PO CAPS: 50.0000 mg | ORAL_CAPSULE | Freq: Every day | ORAL | 5 refills | Status: DC

## 2022-05-21 NOTE — Telephone Encounter (Signed)
Okay to refill since you just doubled her dose to 50 mg daily?

## 2022-05-21 NOTE — Telephone Encounter (Signed)
Patient called in and stated that the medication is helping with the headaches, but since she is doing the double dose she is almost out. She is needing a new prescription to be sent in reflecting the new dose she is taking. Thank you!

## 2022-05-21 NOTE — Addendum Note (Signed)
Addended by: Owens Loffler on: 05/21/2022 09:54 AM   Modules accepted: Orders

## 2022-05-21 NOTE — Telephone Encounter (Signed)
done

## 2022-05-21 NOTE — Addendum Note (Signed)
Addended by: Carter Kitten on: 05/21/2022 09:44 AM   Modules accepted: Orders

## 2022-05-22 ENCOUNTER — Ambulatory Visit (INDEPENDENT_AMBULATORY_CARE_PROVIDER_SITE_OTHER): Payer: 59 | Admitting: Internal Medicine

## 2022-05-22 ENCOUNTER — Encounter: Payer: Self-pay | Admitting: Internal Medicine

## 2022-05-22 VITALS — BP 126/80 | HR 106 | Ht 68.0 in | Wt 146.0 lb

## 2022-05-22 DIAGNOSIS — K8689 Other specified diseases of pancreas: Secondary | ICD-10-CM

## 2022-05-22 DIAGNOSIS — L02211 Cutaneous abscess of abdominal wall: Secondary | ICD-10-CM | POA: Diagnosis not present

## 2022-05-22 DIAGNOSIS — K7031 Alcoholic cirrhosis of liver with ascites: Secondary | ICD-10-CM

## 2022-05-22 DIAGNOSIS — K863 Pseudocyst of pancreas: Secondary | ICD-10-CM

## 2022-05-22 NOTE — Patient Instructions (Signed)
We will place a referral to Dr Gayleen Orem at [facility]   Due to recent changes in healthcare laws, you may see the results of your imaging and laboratory studies on MyChart before your provider has had a chance to review them.  We understand that in some cases there may be results that are confusing or concerning to you. Not all laboratory results come back in the same time frame and the provider may be waiting for multiple results in order to interpret others.  Please give [place] 48 hours in order for your provider to thoroughly review all the results before contacting the office for clarification of your results.    I appreciate the opportunity to care for you. Silvano Rusk, MD, [facility]

## 2022-05-22 NOTE — Progress Notes (Signed)
Mallory Stewart 61 y.o. 1960-10-11 [id number]  Assessment & Plan:   Encounter Diagnoses  Name Primary?   Alcoholic cirrhosis of liver with ascites (HCC) Yes   Abscess of flank/back    Pancreatic duct stricture    Infected pseudocyst of pancreas    Overall she appears improved and hopefully stable.  I do think this protrusion or bulge in the left flank and back area is ascites given the palpation characteristics of it.  She will monitor this for enlargement or change.  Given recurrent issues with the abscess thought associated with the pancreas, and pancreatic duct stricture will [facility] setting for further evaluation by an advanced endoscopist to determine if there is any intervention that would reduce the chance of recurrent abscess.  Very challenging situation given her cirrhosis which precludes surgery.  Hopefully she will do well when she finishes her antibiotics.   CC: Excell Seltzer, MD Copy to Dr. Paulette Blanch [facility] NP  Subjective:   Chief Complaint: Follow-up of abscess  HPI 62 year old woman with alcoholic cirrhosis and ascites, alcoholic pancreatitis with pseudocyst and prior C. difficile colitis who has had 2 hospitalizations and problems with infected pancreatic pseudocyst and left flank/back abscess.  Here with her husband today.  She was hospitalized in December after a prolonged hospitalization in September.  ERCP in September 2023 raise the question of pancreatic duct stricture which really could not be stented due to inability to pass the wire deeper.  There was suspicion of a ventral pancreatic duct stricture in the head of the pancreas.  No leak was found.  Pancreatic sphincterotomy was performed.  She was then hospitalized again in December with another abscess extending to the flank and it was drained by IR again and she has been on a prolonged course of antibiotics which she is about to finish.  Most recent CT scan of 04/24/2022  showed interval improvement and near resolution of the fluid collection near the tail the pancreas which measures about 1.5 x 1 cm.  Persistent cirrhosis noted.  Moderate volume predominantly perihepatic ascites.  Gastric varices and a spinal gastrorenal shunt persisted.  She reports she is feeling well right now.  She has had several checks of her BMET and sodium has stabilized in the normal or near normal range she had had some hyponatremia in December.  Continues on furosemide 20 mg and spironolactone 50 mg daily.  She is about to finish her antibiotics.  She has noted a fluid density bulge in her left flank where abscess had been previously.  She reports that the CT scan of 04/24/2022 was not covered by her insurance.  They have refused to pay.  She has been covered with vancomycin to reduce the chance of recurrent C. difficile colitis.  She reports that she would like to return to work but her health status is not allowing so far.  She has been seen and will be followed in the [facility] clinic.  She has a postconcussion headache syndrome that is being treated with nortriptyline. Wt Readings from Last 3 Encounters:  05/22/22 146 lb (66.2 kg)  05/09/22 147 lb (66.7 kg)  04/26/22 145 lb (65.8 kg)    Allergies  Allergen Reactions   Ceftriaxone Hives    Immediate bright red itchy rash on bilateral legs after starting infusion.     Gluten Meal Other (See Comments)    Stomach distress   Zocor [Simvastatin] Other (See Comments)  Elevated lfts?   Current Meds  Medication Sig   Blood Glucose Monitoring Suppl (ONE TOUCH ULTRA 2) w/Device KIT Use to check blood sugars two times day   CALCIUM PO Take 1 tablet by mouth in the morning and at bedtime.   cetirizine (ZYRTEC) 10 MG tablet Take 10 mg by mouth at bedtime.   Cholecalciferol (VITAMIN D3 PO) Take 2 capsules by mouth in the morning and at bedtime.   Cyanocobalamin (VITAMIN B-12) 5000 MCG SUBL Place 5,000 mcg under the tongue  daily.   folic acid (FOLVITE) 1 MG tablet Take 1 tablet (1 mg total) by mouth daily.   furosemide (LASIX) 20 MG tablet Take 1 tablet (20 mg total) by mouth daily.   glucose blood (ONETOUCH ULTRA) test strip CHECK BLOOD SUGAR TWICE DAILY AS DIRECTED   Insulin Glargine (BASAGLAR KWIKPEN) 100 UNIT/ML Inject 30 Units into the skin daily.   insulin lispro (HUMALOG KWIKPEN) 100 UNIT/ML KwikPen Max daily 30 units (Patient taking differently: 8 Units See admin instructions. Inject 6 units into the skin three times a day with meals, PER SLIDING SCALE)   Insulin Pen Needle 32G X 4 MM MISC 1 Device by Does not apply route in the morning, at noon, in the evening, and at bedtime.   Lancets (ONETOUCH ULTRASOFT) lancets Use to check blood sugar two times a day.   nortriptyline (PAMELOR) 50 MG capsule Take 1 capsule (50 mg total) by mouth at bedtime.   spironolactone (ALDACTONE) 50 MG tablet Take 1 tablet (50 mg total) by mouth daily.   vancomycin (VANCOCIN) 125 MG capsule Take 1 capsule (125 mg total) by mouth 2 (two) times daily.   Past Medical History:  Diagnosis Date   Alcohol-induced chronic pancreatitis (HCC)    Alcoholic cirrhosis (HCC)    B12 deficiency    Back abscess    C. difficile colitis    Concussion    DDD (degenerative disc disease), cervical    Diabetes ([place])    DKA, type 2 ([place])    Gastric outlet obstruction 08/12/2018   Hypertension    Neuropathy    Pancreatic pseudocyst/cyst 05/06/2013   Seasonal allergies    Past Surgical History:  Procedure Laterality Date   ANKLE ARTHROSCOPY Right 12/20/2020   Procedure: RIGHT ANKLE ARTHROSCOPIC DEBRIDEMENT;  Surgeon: Newt Minion, MD;  Location: [place];  Service: Orthopedics;  Laterality: Right;   BILIARY BRUSHING  01/27/2022   Procedure: BILIARY BRUSHING;  Surgeon: Rush Landmark Telford Nab., MD;  Location: [facility];  Service: Gastroenterology;;   BIOPSY  01/19/2019   Procedure: BIOPSY;  Surgeon: Irving Copas., MD;  Location: [place];  Service: Gastroenterology;;   CHOLECYSTECTOMY N/A 07/28/2016   Procedure: LAPAROSCOPIC CHOLECYSTECTOMY WITH INTRAOPERATIVE CHOLANGIOGRAM;  Surgeon: Mickeal Skinner, MD;  Location: [place];  Service: General;  Laterality: N/A;   ERCP N/A 01/27/2022   Procedure: ENDOSCOPIC RETROGRADE CHOLANGIOPANCREATOGRAPHY (ERCP);  Surgeon: Irving Copas., MD;  Location: [facility];  Service: Gastroenterology;  Laterality: N/A;   ESOPHAGOGASTRODUODENOSCOPY (EGD) WITH PROPOFOL N/A 08/13/2018   Procedure: ESOPHAGOGASTRODUODENOSCOPY (EGD) WITH PROPOFOL;  Surgeon: Rush Landmark Telford Nab., MD;  Location: [place];  Service: Gastroenterology;  Laterality: N/A;   ESOPHAGOGASTRODUODENOSCOPY (EGD) WITH PROPOFOL N/A 01/19/2019   Procedure: ESOPHAGOGASTRODUODENOSCOPY (EGD) WITH PROPOFOL;  Surgeon: Rush Landmark Telford Nab., MD;  Location: [place];  Service: Gastroenterology;  Laterality: N/A;   ESOPHAGOGASTRODUODENOSCOPY (EGD) WITH PROPOFOL N/A 12/28/2021   Procedure: ESOPHAGOGASTRODUODENOSCOPY (EGD) WITH PROPOFOL;  Surgeon: Rush Landmark Telford Nab., MD;  Location: [facility];  Service: Gastroenterology;  Laterality: N/A;   EUS N/A 12/28/2021   Procedure: UPPER ENDOSCOPIC ULTRASOUND (EUS) LINEAR;  Surgeon: Irving Copas., MD;  Location: [facility];  Service: Gastroenterology;  Laterality: N/A;   INCISION AND DRAINAGE ABSCESS N/A 01/25/2022   Procedure: INCISION AND DRAINAGE BACK ABSCESS;  Surgeon: Kieth Brightly, Arta Bruce, MD;  Location: [facility];  Service: General;  Laterality: N/A;  90 L DOW   IR PARACENTESIS  08/08/2020   IR PARACENTESIS  03/13/2021   IR PARACENTESIS  02/23/2022   IR RADIOLOGIST EVAL & MGMT  04/24/2022   IR US GUIDE BX ASP/DRAIN  01/23/2022   LEEP  1990's   ORIF FOOT FRACTURE  09/2008   L 5th metatarsal   PANCREATIC STENT PLACEMENT  01/27/2022   Procedure: PANCREATIC STENT PLACEMENT;  Surgeon: Irving Copas., MD;  Location: [facility];   Service: Gastroenterology;;   REFRACTIVE SURGERY  2000   REMOVAL OF STONES  01/27/2022   Procedure: REMOVAL OF STONES;  Surgeon: Irving Copas., MD;  Location: [facility];  Service: Gastroenterology;;   Joan Mayans  01/27/2022   Procedure: Joan Mayans;  Surgeon: Irving Copas., MD;  Location: [facility];  Service: Gastroenterology;;   TIBIA IM NAIL INSERTION Left 03/12/2021   Procedure: INTRAMEDULLARY (IM) NAIL TIBIAL;  Surgeon: Jessy Oto, MD;  Location: [place];  Service: Orthopedics;  Laterality: Left;   TONSILLECTOMY     UPPER ESOPHAGEAL ENDOSCOPIC ULTRASOUND (EUS) N/A 08/13/2018   Procedure: UPPER ESOPHAGEAL ENDOSCOPIC ULTRASOUND (EUS);  Surgeon: Irving Copas., MD;  Location: [place];  Service: Gastroenterology;  Laterality: N/A;   UPPER ESOPHAGEAL ENDOSCOPIC ULTRASOUND (EUS) N/A 01/19/2019   Procedure: UPPER ESOPHAGEAL ENDOSCOPIC ULTRASOUND (EUS);  Surgeon: Irving Copas., MD;  Location: [place];  Service: Gastroenterology;  Laterality: N/A;   Social History   Social History Narrative   Patient is married one stepson    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 not traveling   regular exercise , walking 4-5 times a week   Diet fruits and veggies, water   Alcohol at least several glasses of wine a week Stopped 08/2018   Never smoker, no drug use   Right handed   One story home   [place] in the am   family history includes Atrial fibrillation in her sister; Brain cancer in her maternal grandfather; Breast cancer in her maternal grandmother; Cancer in her paternal grandfather; Coronary artery disease in her paternal grandmother; Healthy in her brother and brother; Heart attack (age of onset: 72) in her paternal grandmother; Other in her father and mother; Prostate cancer in her father; Stroke in her father.   Review of Systems As per HPI  Objective:   Physical  Exam @BP  126/80   Pulse (!) 106   Ht 5\' 8"  (1.727 m)   Wt 146 lb (66.2 kg)   BMI 22.20 kg/m @  General:  NAD, thin Eyes:   anicteric Lungs:  clear Heart::  S1S2 no rubs, murmurs or gallops Abdomen:  soft and nontender, BS+,flatw/o obviousascites -there is a fluid-density compressible bulge at site ofprior abscess and drain area on left flank,non-tender Ext:   no edema, cyanosis or clubbing    Data Reviewed:  As per HPI I have reviewed labs in the last 6 to 8 weeks primary care visits Hospital notes imaging studies

## 2022-05-28 NOTE — Assessment & Plan Note (Signed)
Acute, drain issues currently.  Contacted IR on-call doctor for recommendations given attempted flushing and office results in sterile saline leaking down patient's back. IR MD stated it was okay to stop flushing at home and to keep the appointment next week with interventional radiology for CT scan with plan of removal of drain.  No current red flags for worsening/recurrent infection such as fever, redness spreading or puslike discharge.  Assisted patient with confirming drain to and bandaged attached correctly.

## 2022-05-28 NOTE — Assessment & Plan Note (Signed)
Chronic, increased risk for infection and wound healing complications.

## 2022-05-30 ENCOUNTER — Telehealth: Payer: Self-pay | Admitting: Internal Medicine

## 2022-05-30 NOTE — Telephone Encounter (Signed)
Per ov note from 05/22/22 pt was to be referred to Dr. Gayleen Orem. PJ pt is calling to check on this referral.

## 2022-05-30 NOTE — Telephone Encounter (Signed)
Patient called, stated she was told she was going to receive a referral for [facility] from Dr. Carlean Purl but has yet to hear anything. Patient is following up on referral.

## 2022-05-31 NOTE — Telephone Encounter (Signed)
I spoke with Mallory Stewart at [facility], phone # [phone] and she said Mallory Stewart should be getting a call by Monday to set up her appointment. I left Mallory Stewart a detailed message with this information. Dr Gayleen Orem that Dr Carlean Purl request see the patient is the Head of the Department and he over see's patient care per Mallory Stewart.

## 2022-06-10 NOTE — Progress Notes (Unsigned)
    Mallory Catala T. Levent Kornegay, MD, [facility] [place], [address]  Phone: [phone]  FAX: [place] - 62 y.o. female  MRN [id number]  Date of Birth: [birth year]  Date: 06/11/2022  PCP: Mallory Sanders, MD  Referral: Mallory Sanders, MD  No chief complaint on file.  Subjective:   Mallory Stewart is a 62 y.o. very pleasant female patient with There is no height or weight on file to calculate BMI. who presents with the following:  She is here for concussion follow-up.  I have seen her 3 times previously for acute concussion and then postconcussive syndrome and headache.  She is in doing much better the last time I saw her compared to previously, she is here today in follow-up.  She had been having some ongoing significant problems with headache, and nortriptyline had helped this.    Review of Systems is noted in the HPI, as appropriate  Objective:   There were no vitals taken for this visit.  GEN: No acute distress; alert,appropriate. PULM: Breathing comfortably in no respiratory distress PSYCH: Normally interactive.   Laboratory and Imaging Data:  Assessment and Plan:   ***

## 2022-06-11 ENCOUNTER — Encounter: Payer: Self-pay | Admitting: Family Medicine

## 2022-06-11 ENCOUNTER — Ambulatory Visit (INDEPENDENT_AMBULATORY_CARE_PROVIDER_SITE_OTHER): Payer: 59 | Admitting: Family Medicine

## 2022-06-11 VITALS — BP 110/74 | HR 68 | Temp 97.4°F | Ht 68.0 in | Wt 141.4 lb

## 2022-06-11 DIAGNOSIS — G4700 Insomnia, unspecified: Secondary | ICD-10-CM

## 2022-06-11 DIAGNOSIS — F0781 Postconcussional syndrome: Secondary | ICD-10-CM | POA: Diagnosis not present

## 2022-06-11 DIAGNOSIS — F411 Generalized anxiety disorder: Secondary | ICD-10-CM | POA: Diagnosis not present

## 2022-06-11 DIAGNOSIS — G44309 Post-traumatic headache, unspecified, not intractable: Secondary | ICD-10-CM | POA: Diagnosis not present

## 2022-06-12 ENCOUNTER — Encounter: Payer: Self-pay | Admitting: Family Medicine

## 2022-06-12 ENCOUNTER — Ambulatory Visit: Payer: 59 | Admitting: Family Medicine

## 2022-06-13 ENCOUNTER — Inpatient Hospital Stay: Payer: 59 | Admitting: Infectious Disease

## 2022-06-25 ENCOUNTER — Telehealth: Payer: Self-pay | Admitting: Family Medicine

## 2022-06-25 NOTE — Telephone Encounter (Signed)
Patient called in and said she used to come in every week for labs but it was said that she didn't need to do that anymore but she said its been about a month and she wanted to know if she can come back in to get the labs checked again. Call back is [phone]

## 2022-06-26 ENCOUNTER — Other Ambulatory Visit: Payer: Self-pay | Admitting: Family Medicine

## 2022-06-26 DIAGNOSIS — K7031 Alcoholic cirrhosis of liver with ascites: Secondary | ICD-10-CM

## 2022-06-26 NOTE — Telephone Encounter (Signed)
Lab appointment scheduled 06/28/22 at 8:45 am.

## 2022-06-26 NOTE — Addendum Note (Signed)
Addended by: Tammi Sou on: 06/26/2022 01:38 PM   Modules accepted: Orders

## 2022-06-26 NOTE — Telephone Encounter (Signed)
Yes.. pt can come in for labs.. has standing order... please have her schedule lab only visit.

## 2022-06-26 NOTE — Telephone Encounter (Signed)
Left message for [name] to call office and schedule lab appointment per Dr. Diona Browner.   She has standing orders.

## 2022-06-26 NOTE — Telephone Encounter (Signed)
Pt called asking for update on scheduling lab appt. Call back # [phone]

## 2022-06-28 ENCOUNTER — Other Ambulatory Visit: Payer: 59

## 2022-06-28 NOTE — Addendum Note (Signed)
Addended by: Carter Kitten on: 06/28/2022 09:21 AM   Modules accepted: Orders

## 2022-07-03 ENCOUNTER — Other Ambulatory Visit (INDEPENDENT_AMBULATORY_CARE_PROVIDER_SITE_OTHER): Payer: 59

## 2022-07-03 DIAGNOSIS — K7031 Alcoholic cirrhosis of liver with ascites: Secondary | ICD-10-CM | POA: Diagnosis not present

## 2022-07-03 LAB — COMPREHENSIVE METABOLIC PANEL
ALT: 31 U/L (ref 0–35)
AST: 41 U/L — ABNORMAL HIGH (ref 0–37)
Albumin: 4.3 g/dL (ref 3.5–5.2)
Alkaline Phosphatase: 199 U/L — ABNORMAL HIGH (ref 39–117)
BUN: 36 mg/dL — ABNORMAL HIGH (ref 6–23)
CO2: 26 mEq/L (ref 19–32)
Calcium: 10.8 mg/dL — ABNORMAL HIGH (ref 8.4–10.5)
Chloride: 99 mEq/L (ref 96–112)
Creatinine, Ser: 0.95 mg/dL (ref 0.40–1.20)
GFR: 65.61 mL/min (ref >60.00)
Glucose, Bld: 115 mg/dL — ABNORMAL HIGH (ref 70–99)
Potassium: 4.9 mEq/L (ref 3.5–5.1)
Sodium: 135 mEq/L (ref 135–145)
Total Bilirubin: 0.7 mg/dL (ref 0.2–1.2)
Total Protein: 8.2 g/dL (ref 6.0–8.3)

## 2022-07-03 LAB — HEMOGLOBIN A1C: Hgb A1c MFr Bld: 7.6 % — ABNORMAL HIGH (ref 4.6–6.5)

## 2022-07-03 LAB — PROTIME-INR
INR: 1.1 ratio — ABNORMAL HIGH (ref 0.8–1.0)
Prothrombin Time: 12.3 s (ref 9.6–13.1)

## 2022-07-04 ENCOUNTER — Ambulatory Visit (INDEPENDENT_AMBULATORY_CARE_PROVIDER_SITE_OTHER): Payer: 59 | Admitting: Dermatology

## 2022-07-04 VITALS — BP 139/84

## 2022-07-04 DIAGNOSIS — Z7189 Other specified counseling: Secondary | ICD-10-CM | POA: Diagnosis not present

## 2022-07-04 DIAGNOSIS — L720 Epidermal cyst: Secondary | ICD-10-CM | POA: Diagnosis not present

## 2022-07-04 NOTE — Patient Instructions (Addendum)
[place] [address]   You have an upcoming surgery appointment scheduled at [facility]. 07/31/2022 at 10:30 am  PRE-OPERATIVE INSTRUCTIONS  We recommend you read the following instructions. If you have any questions or concerns, please call the office at [phone].  Shower and wash the entire body with soap and water the day of your surgery paying special attention to cleansing at and around the planned surgery site. Avoid aspirin or aspirin containing products at least ten (10) days prior to your surgical procedure and for at least one week (7 days) after your surgical procedure. If you take aspirin on a regular basis for heart disease or history of stroke or for any other reason, we may recommend you continue taking aspirin but please notify us if you take this on a regular basis. Aspirin can cause more bleeding to occur during surgery as well as prolonged bleeding and bruising after surgery. Avoid other nonsteroidal pain medications at least one week prior to surgery and at least one week after your surgery. These include medications such as Ibuprofen (Motrin, Advil and Nuprin), Naprosyn, Voltaren, Relafen, etc. If these medications are used for therapeutic reasons, please inform us as they can cause increased bleeding or prolonged bleeding during and bruising after surgical procedures.  Please advise [place] if you are taking any "blood thinner" medications such as Coumadin or Dipyridamole or Plavix or similar medications. These cause increased bleeding and prolonged bleeding during procedures and bruising after surgical procedures. We may have to consider discontinuing these medications briefly prior to and shortly after your surgery if safe to do so. Please inform us of all medications you are currently taking. All medications that are taken regularly should be taken the day of surgery as you always do. Nevertheless, we need to be informed of what  medications you are taking prior to surgery to know whether they will affect the procedure or cause any complications. Please inform us of any medication allergies. Also inform us of whether you have allergies to Latex or rubber products or whether you have had any adverse reaction to Lidocaine or Epinephrine. Please inform us of any prosthetic or artificial body parts such as artificial heart valve, joint replacements, etc., or similar condition that might require preoperative antibiotics. We recommend avoidance of alcohol at least two weeks prior to surgery and continued avoidance for at least two weeks after surgery. We recommend avoidance of tobacco smoking at least two weeks prior to surgery and continued abstinence for at least two weeks after surgery. Do not plan strenuous exercise, strenuous work or strenuous lifting for approximately four weeks after your surgery. We request if you are unable to make your scheduled surgical appointment, please call us at least a week in advance or as soon as you are aware of a problem so that we can cancel or reschedule the appointment. You MAY TAKE TYLENOL (acetaminophen) for pain as it is not a blood thinner. PLEASE PLAN TO BE IN TOWN FOR TWO WEEKS FOLLOWING SURGERY. THIS IS IMPORTANT SO YOU CAN BE CHECKED FOR DRESSING CHANGES, SUTURE REMOVAL AND TO MONITOR FOR POSSIBLE COMPLICATIONS.    Due to recent changes in healthcare laws, you may see results of your pathology and/or laboratory studies on MyChart before the doctors have had a chance to review them. We understand that in some cases there may be results that are confusing or concerning to you. Please understand that not all results are received at the same time  and often the doctors may need to interpret multiple results in order to provide you with the best plan of care or course of treatment. Therefore, we ask that you please give [place] 2 business days to thoroughly review all your results before contacting  the office for clarification. Should we see a critical lab result, you will be contacted sooner.   If You Need Anything After Your Visit  If you have any questions or concerns for your doctor, please call our main line at [phone] and press option 4 to reach your doctor's medical assistant. If no one answers, please leave a voicemail as directed and we will return your call as soon as possible. Messages left after 4 pm will be answered the following business day.   You may also send [place] a message via [place]. We typically respond to MyChart messages within 1-2 business days.  For prescription refills, please ask your pharmacy to contact our office. Our fax number is [phone].  If you have an urgent issue when the clinic is closed that cannot wait until the next business day, you can page your doctor at the number below.    Please note that while we do our best to be available for urgent issues outside of office hours, we are not available 24/7.   If you have an urgent issue and are unable to reach [place], you may choose to seek medical care at your doctor's office, retail clinic, urgent care center, or emergency room.  If you have a medical emergency, please immediately call 911 or go to the emergency department.  Pager Numbers  - Dr. Nehemiah Massed: [phone]  - Dr. Laurence Ferrari: [phone]  - Dr. Nicole Kindred: [phone]  In the event of inclement weather, please call our main line at [phone] for an update on the status of any delays or closures.  Dermatology Medication Tips: Please keep the boxes that topical medications come in in order to help keep track of the instructions about where and how to use these. Pharmacies typically print the medication instructions only on the boxes and not directly on the medication tubes.   If your medication is too expensive, please contact our office at [phone] option 4 or send [place] a message through [place].   We are unable to tell what your  co-pay for medications will be in advance as this is different depending on your insurance coverage. However, we may be able to find a substitute medication at lower cost or fill out paperwork to get insurance to cover a needed medication.   If a prior authorization is required to get your medication covered by your insurance company, please allow [place] 1-2 business days to complete this process.  Drug prices often vary depending on where the prescription is filled and some pharmacies may offer cheaper prices.  The website www.goodrx.com contains coupons for medications through different pharmacies. The prices here do not account for what the cost may be with help from insurance (it may be cheaper with your insurance), but the website can give you the price if you did not use any insurance.  - You can print the associated coupon and take it with your prescription to the pharmacy.  - You may also stop by our office during regular business hours and pick up a GoodRx coupon card.  - If you need your prescription sent electronically to a different pharmacy, notify our office through [facility] or by phone at [phone] option 4.     Si  Usted Necesita Algo Despus de Su Visita  Tambin puede enviarnos un mensaje a travs de Pharmacist, community. Por lo general respondemos a los mensajes de MyChart en el transcurso de 1 a 2 das hbiles.  Para renovar recetas, por favor pida a su farmacia que se ponga en contacto con nuestra oficina. Harland Dingwall de fax es [place] [phone].  Si tiene un asunto urgente cuando la clnica est cerrada y que no puede esperar hasta el siguiente da hbil, puede llamar/localizar a su doctor(a) al nmero que aparece a continuacin.   Por favor, tenga en cuenta que aunque hacemos todo lo posible para estar disponibles para asuntos urgentes fuera del horario de [place], no estamos disponibles las 24 horas del da, los 7 das de la [place].   Si tiene un problema urgente y no  puede comunicarse con nosotros, puede optar por buscar atencin mdica  en el consultorio de su doctor(a), en una clnica privada, en un centro de atencin urgente o en una sala de emergencias.  Si tiene Engineering geologist, por favor llame inmediatamente al 911 o vaya a la sala de emergencias.  Nmeros de bper  - Dr. Nehemiah Massed: [phone]  - Dra. Moye: [phone]  - Dra. Nicole Kindred: [phone]  En caso de inclemencias del [place], por favor llame a Johnsie Kindred principal al [phone] para una actualizacin sobre el [place] de cualquier retraso o cierre.  Consejos para la medicacin en dermatologa: Por favor, guarde las cajas en las que vienen los medicamentos de uso tpico para ayudarle a seguir las instrucciones sobre dnde y cmo usarlos. Las farmacias generalmente imprimen las instrucciones del medicamento slo en las cajas y no directamente en los tubos del [place].   Si su medicamento es muy caro, por favor, pngase en contacto con Zigmund Daniel llamando al [phone] y presione la opcin 4 o envenos un mensaje a travs de Pharmacist, community.   No podemos decirle cul ser su copago por los medicamentos por adelantado ya que esto es diferente dependiendo de la cobertura de su seguro. Sin embargo, es posible que podamos encontrar un medicamento sustituto a Electrical engineer un formulario para que el seguro cubra el medicamento que se considera necesario.   Si se requiere una autorizacin previa para que su compaa de seguros [place] su medicamento, por favor permtanos de 1 a 2 das hbiles para completar este proceso.  Los precios de los medicamentos varan con frecuencia dependiendo del Environmental consultant de dnde se surte la receta y alguna farmacias pueden ofrecer precios ms baratos.  El sitio web www.goodrx.com tiene cupones para medicamentos de Airline pilot. Los precios aqu no tienen en cuenta lo que podra costar con la ayuda del seguro (puede ser ms barato con su seguro),  pero el sitio web puede darle el precio si no utiliz Research scientist (physical sciences).  - Puede imprimir el cupn correspondiente y llevarlo con su receta a la farmacia.  - Tambin puede pasar por nuestra oficina durante el horario de atencin regular y Charity fundraiser una tarjeta de cupones de GoodRx.  - Si necesita que su receta se enve electrnicamente a una farmacia diferente, informe a nuestra oficina a travs de MyChart de [place] o por telfono llamando al [phone] y presione la opcin 4.

## 2022-07-04 NOTE — Progress Notes (Signed)
   New Patient Visit  Subjective  Mallory Stewart is a 62 y.o. female who presents for the following: Other (Large lump on post neck that has grown quite a bit in the last 9 months).  The following portions of the chart were reviewed this encounter and updated as appropriate:   Tobacco  Allergies  Meds  Problems  Med Hx  Surg Hx  Fam Hx     Review of Systems:  No other skin or systemic complaints except as noted in HPI or Assessment and Plan.  Objective  Well appearing patient in no apparent distress; mood and affect are within normal limits.  A focused examination was performed including neck. Relevant physical exam findings are noted in the Assessment and Plan.  Left lat base of neck 2.1 x 1.5 cm cystic papule   Assessment & Plan  Epidermal inclusion cyst Left lat base of neck  Discussed excision. Patient will schedule a surgery appointment. Benign-appearing. Exam most consistent with an epidermal inclusion cyst. Discussed that a cyst is a benign growth that can grow over time and sometimes get irritated or inflamed. Recommend observation if it is not bothersome. Discussed option of surgical excision to remove it if it is growing, symptomatic, or other changes noted. Please call for new or changing lesions so they can be evaluated.  Return for Follow up as scheduled for surgery.  I, Ashok Cordia, CMA, am acting as scribe for Sarina Ser, MD . Documentation: I have reviewed the above documentation for accuracy and completeness, and I agree with the above.  Sarina Ser, MD

## 2022-07-11 ENCOUNTER — Encounter: Payer: Self-pay | Admitting: Dermatology

## 2022-07-12 ENCOUNTER — Ambulatory Visit (INDEPENDENT_AMBULATORY_CARE_PROVIDER_SITE_OTHER): Payer: 59 | Admitting: Internal Medicine

## 2022-07-12 ENCOUNTER — Encounter: Payer: Self-pay | Admitting: Internal Medicine

## 2022-07-12 VITALS — BP 112/68 | HR 100 | Ht 68.0 in | Wt 144.2 lb

## 2022-07-12 DIAGNOSIS — E1142 Type 2 diabetes mellitus with diabetic polyneuropathy: Secondary | ICD-10-CM | POA: Diagnosis not present

## 2022-07-12 DIAGNOSIS — E1165 Type 2 diabetes mellitus with hyperglycemia: Secondary | ICD-10-CM

## 2022-07-12 DIAGNOSIS — L84 Corns and callosities: Secondary | ICD-10-CM | POA: Diagnosis not present

## 2022-07-12 DIAGNOSIS — Z794 Long term (current) use of insulin: Secondary | ICD-10-CM

## 2022-07-12 DIAGNOSIS — E119 Type 2 diabetes mellitus without complications: Secondary | ICD-10-CM

## 2022-07-12 MED ORDER — INSULIN PEN NEEDLE 32G X 4 MM MISC: 1.0000 | Freq: Four times a day (QID) | 3 refills | Status: DC

## 2022-07-12 MED ORDER — BASAGLAR KWIKPEN 100 UNIT/ML ~~LOC~~ SOPN: 30.0000 [IU] | PEN_INJECTOR | Freq: Every day | SUBCUTANEOUS | 3 refills | Status: DC

## 2022-07-12 MED ORDER — INSULIN LISPRO (1 UNIT DIAL) 100 UNIT/ML (KWIKPEN): PEN_INJECTOR | SUBCUTANEOUS | 3 refills | Status: DC

## 2022-07-12 MED ORDER — ONETOUCH ULTRA VI STRP: 1.0000 | ORAL_STRIP | Freq: Every day | 3 refills | Status: DC

## 2022-07-12 NOTE — Progress Notes (Signed)
Name: Mallory Stewart  Age/ Sex: 62 y.o., female   MRN/ DOB: [id number], [birth year]     PCP: [name], MD   Reason for Endocrinology Evaluation: Type 2 Diabetes Mellitus  Initial Endocrine Consultative Visit: 08/22/2018    PATIENT IDENTIFIER: Mallory Stewart is a 62 y.o. female with a past medical history of HTN, T2DM , chronic pancreatitis and gastric outlet obstruction and alcoholic cirrhosis. The patient has followed with Endocrinology clinic since 08/22/2018 for consultative assistance with management of her diabetes.  DIABETIC HISTORY:  Mallory Stewart was with  T2DM in 2010.Pt had hospital admission in 2014 for pancreatitis secondary to ETOH intake, with another one in 2018 and 2020.  Insulin started in 07/2018 . Her hemoglobin A1c has ranged from 5.4% in 2016, peaking at 13.3%  In 2020.  Prandial insulin started 08/2018 Wilder Glade added in 09/2018 but stopped due to cost 2023   Started MDI regimen July 2023  SUBJECTIVE:   During the last visit (04/06/2022): A1c 7.4 %      Today (07/12/2022): Mallory Stewart is here for a follow up on diabetes management. She checks her blood sugars 4-5 times daily . The patient has hypoglycemic episodes, she is symptoms with these episodes, thry typically occur after breakfast and in the afternoon    She continues to follow-up with gastroenterology for alcoholic liver cirrhosis and chronic pancreatitis   Has an abdominal MRI next week , pending determination for the need  for stent placement   No abdominal pain in general  Denies nausea, constipation or diarrhea    HOME DIABETES REGIMEN:  Basaglar 30 units daily  Humalog  8 units 3 times daily before every meal Correction factor : NovoLog (BG -130/30)    Statin: No ACE-I/ARB: yes    GLUCOSE LOG:  Fasting this am 132 mg/dL     DIABETIC COMPLICATIONS: Microvascular complications:  Neuropathic , S/P cataract sx Denies: CKD Last Eye Exam: Completed  2023  Macrovascular complications:  Denies: CAD, CVA, PVD   HISTORY:  Past Medical History:  Past Medical History:  Diagnosis Date   Alcohol-induced chronic pancreatitis ([place])    Alcoholic cirrhosis ([place])    B12 deficiency    Back abscess    C. difficile colitis    Concussion    DDD (degenerative disc disease), cervical    Diabetes ([place])    DKA, type 2 ([place])    Gastric outlet obstruction 08/12/2018   Hypertension    Neuropathy    Pancreatic pseudocyst/cyst 05/06/2013   Seasonal allergies    Past Surgical History:  Past Surgical History:  Procedure Laterality Date   ANKLE ARTHROSCOPY Right 12/20/2020   Procedure: RIGHT ANKLE ARTHROSCOPIC DEBRIDEMENT;  Surgeon: Newt Minion, MD;  Location: [place];  Service: Orthopedics;  Laterality: Right;   BILIARY BRUSHING  01/27/2022   Procedure: BILIARY BRUSHING;  Surgeon: Rush Landmark Telford Nab., MD;  [facility];  Service: Gastroenterology;;   BIOPSY  01/19/2019   Procedure: BIOPSY;  Surgeon: Irving Copas., MD;  Location: [place];  Service: Gastroenterology;;   CHOLECYSTECTOMY N/A 07/28/2016   Procedure: LAPAROSCOPIC CHOLECYSTECTOMY WITH INTRAOPERATIVE CHOLANGIOGRAM;  Surgeon: Mickeal Skinner, MD;  Location: [place];  Service: General;  Laterality: N/A;   ERCP N/A 01/27/2022   Procedure: ENDOSCOPIC RETROGRADE CHOLANGIOPANCREATOGRAPHY (ERCP);  Surgeon: Irving Copas., MD;  Location: [facility];  Service: Gastroenterology;  Laterality: N/A;   ESOPHAGOGASTRODUODENOSCOPY (EGD) WITH PROPOFOL N/A 08/13/2018   Procedure: ESOPHAGOGASTRODUODENOSCOPY (EGD) WITH  PROPOFOL;  Surgeon: Irving Copas., MD;  Location: [place];  Service: Gastroenterology;  Laterality: N/A;   ESOPHAGOGASTRODUODENOSCOPY (EGD) WITH PROPOFOL N/A 01/19/2019   Procedure: ESOPHAGOGASTRODUODENOSCOPY (EGD) WITH PROPOFOL;  Surgeon: Rush Landmark Telford Nab., MD;  Location: [place];  Service: Gastroenterology;   Laterality: N/A;   ESOPHAGOGASTRODUODENOSCOPY (EGD) WITH PROPOFOL N/A 12/28/2021   Procedure: ESOPHAGOGASTRODUODENOSCOPY (EGD) WITH PROPOFOL;  Surgeon: Rush Landmark Telford Nab., MD;  [facility];  Service: Gastroenterology;  Laterality: N/A;   EUS N/A 12/28/2021   Procedure: UPPER ENDOSCOPIC ULTRASOUND (EUS) LINEAR;  Surgeon: Irving Copas., MD;  [facility];  Service: Gastroenterology;  Laterality: N/A;   INCISION AND DRAINAGE ABSCESS N/A 01/25/2022   Procedure: INCISION AND DRAINAGE BACK ABSCESS;  Surgeon: Kieth Brightly, Arta Bruce, MD;  [facility];  Service: General;  Laterality: N/A;  90 L DOW   IR PARACENTESIS  08/08/2020   IR PARACENTESIS  03/13/2021   IR PARACENTESIS  02/23/2022   IR RADIOLOGIST EVAL & MGMT  04/24/2022   IR US GUIDE BX ASP/DRAIN  01/23/2022   LEEP  1990's   ORIF FOOT FRACTURE  09/2008   L 5th metatarsal   PANCREATIC STENT PLACEMENT  01/27/2022   Procedure: PANCREATIC STENT PLACEMENT;  Surgeon: Irving Copas., MD;  Location[facility];  Service: Gastroenterology;;   REFRACTIVE SURGERY  2000   REMOVAL OF STONES  01/27/2022   Procedure: REMOVAL OF STONES;  Surgeon: Irving Copas., MD;  Location[facility];  Service: Gastroenterology;;   Joan Mayans  01/27/2022   Procedure: Joan Mayans;  Surgeon: Irving Copas., MD;  Location: [facility];  Service: Gastroenterology;;   TIBIA IM NAIL INSERTION Left 03/12/2021   Procedure: INTRAMEDULLARY (IM) NAIL TIBIAL;  Surgeon: Jessy Oto, MD;  Location: [place];  Service: Orthopedics;  Laterality: Left;   TONSILLECTOMY     UPPER ESOPHAGEAL ENDOSCOPIC ULTRASOUND (EUS) N/A 08/13/2018   Procedure: UPPER ESOPHAGEAL ENDOSCOPIC ULTRASOUND (EUS);  Surgeon: Irving Copas., MD;  Location: [place];  Service: Gastroenterology;  Laterality: N/A;   UPPER ESOPHAGEAL ENDOSCOPIC ULTRASOUND (EUS) N/A 01/19/2019   Procedure: UPPER ESOPHAGEAL ENDOSCOPIC ULTRASOUND (EUS);   Surgeon: Irving Copas., MD;  Location: [place];  Service: Gastroenterology;  Laterality: N/A;   Social History:  reports that she has never smoked. She has never used smokeless tobacco. She reports that she does not currently use alcohol after a past usage of about 7.0 standard drinks of alcohol per week. She reports that she does not use drugs. Family History:  Family History  Problem Relation Age of Onset   Other Mother        tachycardia.Marland KitchenMarland Kitchen?afib   Stroke Father        after hernia suegery   Prostate cancer Father    Other Father        global transient amnesia, unclear source   Atrial fibrillation Sister    Healthy Brother    Healthy Brother    Coronary artery disease Paternal Grandmother    Heart attack Paternal Grandmother 45   Brain cancer Maternal Grandfather        ?   Cancer Paternal Grandfather        ?   Breast cancer Maternal Grandmother      HOME MEDICATIONS: Allergies as of 07/12/2022       Reactions   Ceftriaxone Hives   Immediate bright red itchy rash on bilateral legs after starting infusion.     Gluten Meal Other (See Comments)   Stomach distress  Zocor [simvastatin] Other (See Comments)   Elevated lfts?        Medication List        Accurate as of July 12, 2022 12:01 PM. If you have any questions, ask your nurse or doctor.          Basaglar KwikPen 100 UNIT/ML Inject 30 Units into the skin daily.   CALCIUM PO Take 1 tablet by mouth in the morning and at bedtime.   cetirizine 10 MG tablet Commonly known as: ZYRTEC Take 10 mg by mouth at bedtime.   folic acid 1 MG tablet Commonly known as: FOLVITE Take 1 tablet (1 mg total) by mouth daily.   furosemide 20 MG tablet Commonly known as: Lasix Take 1 tablet (20 mg total) by mouth daily.   insulin lispro 100 UNIT/ML KwikPen Commonly known as: HumaLOG KwikPen Max daily 30 units What changed:  how much to take when to take this additional instructions   Insulin Pen  Needle 32G X 4 MM Misc 1 Device by Does not apply route in the morning, at noon, in the evening, and at bedtime.   nortriptyline 50 MG capsule Commonly known as: Pamelor Take 1 capsule (50 mg total) by mouth at bedtime.   ONE TOUCH ULTRA 2 w/Device Kit Use to check blood sugars two times day   OneTouch Ultra test strip Generic drug: glucose blood CHECK BLOOD SUGAR TWICE DAILY AS DIRECTED   onetouch ultrasoft lancets Use to check blood sugar two times a day.   spironolactone 50 MG tablet Commonly known as: Aldactone Take 1 tablet (50 mg total) by mouth daily.   traZODone 100 MG tablet Commonly known as: DESYREL Take 0.5-1 tablets (50-100 mg total) by mouth at bedtime.   Vitamin B-12 5000 MCG Subl Place 5,000 mcg under the tongue daily.   VITAMIN D3 PO Take 2 capsules by mouth in the morning and at bedtime.           OBJECTIVE:   Vital Signs: BP 112/68 (BP Location: Left Arm, Patient Position: Sitting, Cuff Size: Small)   Pulse 100   Ht '5\' 8"'$  (1.727 m)   Wt 144 lb 3.2 oz (65.4 kg)   SpO2 99%   BMI 21.93 kg/m    Exam: General: Pt appears well and is in NAD  Neck: General: Supple without adenopathy. Thyroid: Thyroid size normal.  No goiter or nodules appreciated.   Lungs: Clear with good BS bilat   Heart: RRR   Abdomen: soft, nontender, without masses or organomegaly palpable  Extremities: No pretibial edema.   Neuro: MS is good with appropriate affect, pt is alert and Ox3   DM Foot Exam 07/12/2022  The skin of the feet is without sores or ulcerations but has a pre-ulcerative callus at the left 1st MTH The pedal pulses are 2+ on right and 2+ on left. The sensation is decreased  to a screening 5.07, 10 gram monofilament bilaterally    DATA REVIEWED:  Lab Results  Component Value Date   HGBA1C 7.6 (H) 07/03/2022   HGBA1C 7.4 (A) 04/06/2022   HGBA1C 6.0 (A) 01/17/2022    Latest Reference Range & Units 03/23/22 08:40  Sodium 135 - 145 mEq/L 135   Potassium 3.5 - 5.1 mEq/L 4.6  Chloride 96 - 112 mEq/L 100  CO2 19 - 32 mEq/L 28  Glucose 70 - 99 mg/dL 176 (H)  BUN 6 - 23 mg/dL 30 (H)  Creatinine 0.40 - 1.20 mg/dL 0.85  Calcium 8.4 -  10.5 mg/dL 9.7  GFR >60.00 mL/min 75.12    Latest Reference Range & Units 03/23/22 08:40  Total CHOL/HDL Ratio  3  Cholesterol 0 - 200 mg/dL 187  HDL Cholesterol >39.00 mg/dL 69.80  LDL (calc) 0 - 99 mg/dL 92  NonHDL  117.33  Triglycerides 0.0 - 149.0 mg/dL 126.0  VLDL 0.0 - 40.0 mg/dL 25.2      ASSESSMENT / PLAN / RECOMMENDATIONS:   1) Type 2 Diabetes Mellitus, Sub-Optimally  controlled , With Neuropathic complications - Most recent A1c of 7.6 %. Goal A1c < 7.0 %.    -A1c remains above goal but overall stable - She is NOT interested in CGM technology  - She is intolerant to Metformin - She is not a candidate for GLP-1 agonists, or DPP-4 inhibitors due to hx of pancreatitis.  -Mallory Stewart has been cost prohibitive since losing her job earlier in 2023 -She endorsed hypoglycemia after breakfast, will reduce breakfast dose of Humalog  MEDICATIONS: - Continue  Basaglar 30 units daily -Change Humalog  6 units before breakfast, 8 units before lunch, 8 units before supper - Continue Correction factor : NovoLog (BG -130/30)    EDUCATION / INSTRUCTIONS: BG monitoring instructions: Patient is instructed to check her blood sugars 3 times a day before each meal Call [facility] if: BG persistently < 70  I reviewed the Rule of 15 for the treatment of hypoglycemia in detail with the patient. Literature supplied.   2) Diabetic complications:  Eye: Does not have known diabetic retinopathy.  Neuro/ Feet: Does have known diabetic peripheral neuropathy. Renal: Patient does not have known baseline CKD. She is on an ACEI/ARB at present.     3) Peripheral neuropathy:  -She has noted slight improvement in sensation at the mid plantar surface bilaterally but unfortunately she does have  a preulcerative callus over the left first metatarsal -Referral to podiatry has been placed   F/U in 3 months   Signed electronically by: Mack Guise, MD  [facility]  [address] [place], [place] [address] Phone: [phone] FAX: [phone]   CC: [name], MD [place] [address] Phone: [phone]  Fax: [phone]  Return to Endocrinology clinic as below: Future Appointments  Date Time Provider [place]  07/31/2022 10:30 AM Ralene Bathe, MD [facility] None

## 2022-07-12 NOTE — Patient Instructions (Addendum)
  Continue  Basaglar 30 units daily  Change Humalog  6 units with Breakfast , 8 units with Lunch and 8 units each meal  Novolog correctional insulin: ADD extra units on insulin to your meal-time Novolog dose if your blood sugars are higher than 160. Use the scale below to help guide you:   Blood sugar before meal Number of units to inject  Less than 160 0 unit  161 -  190 1 units  191 -  220 2 units  221 -  250 3 units  251 -  280 4 units  281 -  310 5 units  311 -  340 6 units  341 -  370 7 units  371 -  400 8 units  401 - 430 9 units   431 - 460 10 units     HOW TO TREAT LOW BLOOD SUGARS (Blood sugar LESS THAN 70 MG/DL) Please follow the RULE OF 15 for the treatment of hypoglycemia treatment (when your (blood sugars are less than 70 mg/dL)   STEP 1: Take 15 grams of carbohydrates when your blood sugar is low, which includes:  3-4 GLUCOSE TABS  OR 3-4 OZ OF JUICE OR REGULAR SODA OR ONE TUBE OF GLUCOSE GEL    STEP 2: RECHECK blood sugar in 15 MINUTES STEP 3: If your blood sugar is still low at the 15 minute recheck --> then, go back to STEP 1 and treat AGAIN with another 15 grams of carbohydrates.

## 2022-07-25 ENCOUNTER — Ambulatory Visit: Payer: 59 | Admitting: Dermatology

## 2022-07-26 ENCOUNTER — Other Ambulatory Visit: Payer: Self-pay | Admitting: Family Medicine

## 2022-07-26 DIAGNOSIS — Z1231 Encounter for screening mammogram for malignant neoplasm of breast: Secondary | ICD-10-CM

## 2022-07-27 ENCOUNTER — Ambulatory Visit (INDEPENDENT_AMBULATORY_CARE_PROVIDER_SITE_OTHER): Payer: 59 | Admitting: Podiatry

## 2022-07-27 ENCOUNTER — Encounter: Payer: Self-pay | Admitting: Podiatry

## 2022-07-27 ENCOUNTER — Ambulatory Visit (INDEPENDENT_AMBULATORY_CARE_PROVIDER_SITE_OTHER): Payer: 59

## 2022-07-27 DIAGNOSIS — L03032 Cellulitis of left toe: Secondary | ICD-10-CM | POA: Diagnosis not present

## 2022-07-27 DIAGNOSIS — Z5189 Encounter for other specified aftercare: Secondary | ICD-10-CM

## 2022-07-27 DIAGNOSIS — E1165 Type 2 diabetes mellitus with hyperglycemia: Secondary | ICD-10-CM | POA: Diagnosis not present

## 2022-07-27 DIAGNOSIS — T148XXA Other injury of unspecified body region, initial encounter: Secondary | ICD-10-CM

## 2022-07-27 DIAGNOSIS — L539 Erythematous condition, unspecified: Secondary | ICD-10-CM | POA: Diagnosis not present

## 2022-07-27 DIAGNOSIS — L02612 Cutaneous abscess of left foot: Secondary | ICD-10-CM

## 2022-07-27 DIAGNOSIS — Z794 Long term (current) use of insulin: Secondary | ICD-10-CM

## 2022-07-27 MED ORDER — DOXYCYCLINE MONOHYDRATE 100 MG PO CAPS: 100.0000 mg | ORAL_CAPSULE | Freq: Two times a day (BID) | ORAL | 0 refills | Status: DC

## 2022-07-27 NOTE — Progress Notes (Signed)
Subjective:  Patient ID: Mallory Stewart, female    DOB: [birth year],  MRN: [id number]  Chief Complaint  Patient presents with   Foot Pain    "I step on a rock about two months ago and developed a blood blister and it has callused over.  I also have recently developed blister on top of my toe and in between the first two toes." N - callus and blisters L - hallux left D - 2 mos O - suddenly C - blister, bleeding, burning, diabetic A - walking, exercising T - neosporin, rubbing alcohol, bandages, went to Endocrinologist and she referred me    62 y.o. female presents with the above complaint.  Patient presents with left hallux blister formation after stepping on block about 2 months ago.  She states that the callus still but keeps forming a blister she does a lot on her feet.  She exercises.  Rest of the HPI as above.  She is a diabetic with last A1c of 7.1%   Review of Systems: Negative except as noted in the HPI. Denies N/V/F/Ch.  Past Medical History:  Diagnosis Date   Alcohol-induced chronic pancreatitis (HCC)    Alcoholic cirrhosis (HCC)    B12 deficiency    Back abscess    C. difficile colitis    Concussion    DDD (degenerative disc disease), cervical    Diabetes ([place])    DKA, type 2 ([place])    Gastric outlet obstruction 08/12/2018   Hypertension    Neuropathy    Pancreatic pseudocyst/cyst 05/06/2013   Seasonal allergies     Current Outpatient Medications:    Blood Glucose Monitoring Suppl (ONE TOUCH ULTRA 2) w/Device KIT, Use to check blood sugars two times day, Disp: 1 each, Rfl: 0   CALCIUM PO, Take 1 tablet by mouth in the morning and at bedtime., Disp: , Rfl:    cetirizine (ZYRTEC) 10 MG tablet, Take 10 mg by mouth at bedtime., Disp: , Rfl:    Cholecalciferol (VITAMIN D3 PO), Take 2 capsules by mouth in the morning and at bedtime., Disp: , Rfl:    Cyanocobalamin (VITAMIN B-12) 5000 MCG SUBL, Place 5,000 mcg under the tongue daily., Disp: , Rfl:    doxycycline  (MONODOX) 100 MG capsule, Take 1 capsule (100 mg total) by mouth 2 (two) times daily., Disp: 28 capsule, Rfl: 0   folic acid (FOLVITE) 1 MG tablet, Take 1 tablet (1 mg total) by mouth daily., Disp: 30 tablet, Rfl: 0   furosemide (LASIX) 20 MG tablet, Take 1 tablet (20 mg total) by mouth daily., Disp: 90 tablet, Rfl: 1   glucose blood (ONETOUCH ULTRA) test strip, 1 each by Other route daily in the afternoon. Use as instructed, Disp: 400 strip, Rfl: 3   Insulin Glargine (BASAGLAR KWIKPEN) 100 UNIT/ML, Inject 30 Units into the skin daily., Disp: 30 mL, Rfl: 3   insulin lispro (HUMALOG KWIKPEN) 100 UNIT/ML KwikPen, Max daily 30 units, Disp: 30 mL, Rfl: 3   Insulin Pen Needle 32G X 4 MM MISC, 1 Device by Does not apply route in the morning, at noon, in the evening, and at bedtime., Disp: 400 each, Rfl: 3   Lancets (ONETOUCH ULTRASOFT) lancets, Use to check blood sugar two times a day., Disp: 200 each, Rfl: 3   nortriptyline (PAMELOR) 50 MG capsule, Take 1 capsule (50 mg total) by mouth at bedtime., Disp: 30 capsule, Rfl: 5   oxycodone (OXY-IR) 5 MG capsule, Take 5 mg by mouth every 6 (  six) hours as needed., Disp: , Rfl:    spironolactone (ALDACTONE) 50 MG tablet, Take 1 tablet (50 mg total) by mouth daily., Disp: 90 tablet, Rfl: 1   traZODone (DESYREL) 100 MG tablet, Take 0.5-1 tablets (50-100 mg total) by mouth at bedtime., Disp: 30 tablet, Rfl: 2  Social History   Tobacco Use  Smoking Status Never  Smokeless Tobacco Never    Allergies  Allergen Reactions   Ceftriaxone Hives    Immediate bright red itchy rash on bilateral legs after starting infusion.     Gluten Meal Other (See Comments)    Stomach distress   Oxycodone Itching   Zocor [Simvastatin] Other (See Comments)    Elevated lfts?   Objective:  There were no vitals filed for this visit. There is no height or weight on file to calculate BMI. Constitutional Well developed. Well nourished.  Vascular Dorsalis pedis pulses palpable  bilaterally. Posterior tibial pulses palpable bilaterally. Capillary refill normal to all digits.  No cyanosis or clubbing noted. Pedal hair growth normal.  Neurologic Normal speech. Oriented to person, place, and time. Epicritic sensation to light touch grossly present bilaterally.  Dermatologic Hyperkeratotic lesion circumferential on the hallux with blister formation noted.  Mild erythema mild malodor present no purulent drainage noted.  Orthopedic: Normal joint ROM without pain or crepitus bilaterally. No visible deformities. No bony tenderness.   Radiographs: None Assessment:   1. Cellulitis and abscess of toe of left foot   2. Wound check, abscess   3. Type 2 diabetes mellitus with hyperglycemia, with long-term current use of insulin ([place])   4. Friction blister   5. Erythema    Plan:  Patient was evaluated and treated and all questions answered.  Left hallux superficial ulceration/epidermal lysis secondary to friction blister/diabetes -All questions and concerns were discussed with the patient in extensive detail.  Given -Given the amount of erythema that is present she will benefit from antibiotics.  She has palpable pulses to both lower extremity therefore less likely to have peripheral vascular disease -I encouraged glucose control -She will do Betadine wet-to-dry dressing changes daily to help with healing -Surgical shoe was dispensed.  No follow-ups on file.

## 2022-07-29 ENCOUNTER — Encounter: Payer: Self-pay | Admitting: Internal Medicine

## 2022-07-31 ENCOUNTER — Telehealth: Payer: Self-pay

## 2022-07-31 ENCOUNTER — Ambulatory Visit (INDEPENDENT_AMBULATORY_CARE_PROVIDER_SITE_OTHER): Payer: 59 | Admitting: Dermatology

## 2022-07-31 ENCOUNTER — Encounter: Payer: Self-pay | Admitting: Dermatology

## 2022-07-31 VITALS — BP 137/90 | HR 84

## 2022-07-31 DIAGNOSIS — D492 Neoplasm of unspecified behavior of bone, soft tissue, and skin: Secondary | ICD-10-CM

## 2022-07-31 DIAGNOSIS — L720 Epidermal cyst: Secondary | ICD-10-CM | POA: Diagnosis not present

## 2022-07-31 MED ORDER — MUPIROCIN 2 % EX OINT: 1.0000 | TOPICAL_OINTMENT | Freq: Every day | CUTANEOUS | 1 refills | Status: DC

## 2022-07-31 NOTE — Telephone Encounter (Signed)
Patient doing well after today's surgery.  Advised pt to call the office if she has any concerns./sh

## 2022-07-31 NOTE — Patient Instructions (Addendum)

## 2022-07-31 NOTE — Progress Notes (Signed)
   Follow-Up Visit   Subjective  Mallory Stewart is a 62 y.o. female who presents for the following: Cyst vs other (L lat base of neck, pt presents for excision).  The following portions of the chart were reviewed this encounter and updated as appropriate:   Tobacco  Allergies  Meds  Problems  Med Hx  Surg Hx  Fam Hx     Review of Systems:  No other skin or systemic complaints except as noted in HPI or Assessment and Plan.  Objective  Well appearing patient in no apparent distress; mood and affect are within normal limits.  A focused examination was performed including left neck. Relevant physical exam findings are noted in the Assessment and Plan.  L lat base of neck Cystic pap 2.6cm   Assessment & Plan  Neoplasm of skin L lat base of neck  mupirocin ointment (BACTROBAN) 2 % Apply 1 Application topically daily. Qd to excision site  Skin excision  Lesion length (cm):  2.6 Lesion width (cm):  2.6 Margin per side (cm):  0 Total excision diameter (cm):  2.6 Informed consent: discussed and consent obtained   Timeout: patient name, date of birth, surgical site, and procedure verified   Procedure prep:  Patient was prepped and draped in usual sterile fashion Prep type:  Isopropyl alcohol and povidone-iodine Anesthesia: the lesion was anesthetized in a standard fashion   Anesthetic:  1% lidocaine w/ epinephrine 1-100,000 buffered w/ 8.4% NaHCO3 (11cc lido w/ epi, 3cc bupivicaine, Total of 14cc) Instrument used comment:  #15c blade Hemostasis achieved with: pressure   Hemostasis achieved with comment:  Electrocautery Outcome: patient tolerated procedure well with no complications   Post-procedure details: sterile dressing applied and wound care instructions given   Dressing type: bandage, pressure dressing and bacitracin (Mupirocin)    Skin repair Complexity:  Complex Final length (cm):  3.5 Reason for type of repair: reduce tension to allow closure, reduce the risk of  dehiscence, infection, and necrosis, reduce subcutaneous dead space and avoid a hematoma, allow closure of the large defect, preserve normal anatomy, preserve normal anatomical and functional relationships and enhance both functionality and cosmetic results   Undermining: area extensively undermined   Undermining comment:  Undermining Defect 2.6 Subcutaneous layers (deep stitches):  Suture size:  4-0 Suture type: Vicryl (polyglactin 910)   Subcutaneous suture technique: Inverted Dermal. Fine/surface layer approximation (top stitches):  Suture size:  4-0 Suture type: nylon   Stitches: simple running   Suture removal (days):  7 Hemostasis achieved with: pressure Outcome: patient tolerated procedure well with no complications   Post-procedure details: sterile dressing applied and wound care instructions given   Dressing type: bandage, pressure dressing and bacitracin (Mupirocin)    Specimen 1 - Surgical pathology Differential Diagnosis: D48.5 Cyst vs other  Check Margins: No Cystic pap  Cyst vs other, excised today Start Mupirocin oint qd to excision site   Return in about 1 week (around 08/07/2022) for suture removal.  I, Othelia Pulling, RMA, am acting as scribe for Sarina Ser, MD . Documentation: I have reviewed the above documentation for accuracy and completeness, and I agree with the above.  Sarina Ser, MD

## 2022-08-02 ENCOUNTER — Encounter: Payer: Self-pay | Admitting: Family Medicine

## 2022-08-02 ENCOUNTER — Ambulatory Visit (INDEPENDENT_AMBULATORY_CARE_PROVIDER_SITE_OTHER): Payer: 59 | Admitting: Family Medicine

## 2022-08-02 VITALS — BP 102/62 | HR 79 | Temp 97.7°F | Ht 68.0 in | Wt 141.0 lb

## 2022-08-02 DIAGNOSIS — T50905A Adverse effect of unspecified drugs, medicaments and biological substances, initial encounter: Secondary | ICD-10-CM | POA: Diagnosis not present

## 2022-08-02 MED ORDER — HYDROXYZINE PAMOATE 25 MG PO CAPS: 25.0000 mg | ORAL_CAPSULE | Freq: Three times a day (TID) | ORAL | 1 refills | Status: DC | PRN

## 2022-08-02 MED ORDER — TRIAMCINOLONE ACETONIDE 0.5 % EX CREA: 1.0000 | TOPICAL_CREAM | Freq: Two times a day (BID) | CUTANEOUS | 0 refills | Status: DC

## 2022-08-02 NOTE — Progress Notes (Signed)
Patient ID: Mallory Stewart, female    DOB: June 21, 1960, 62 y.o.   MRN: [id number]  This visit was conducted in person.  BP 102/62   Pulse 79   Temp 97.7 F (36.5 C) (Temporal)   Ht 5\' 8"  (1.727 m)   Wt 141 lb (64 kg)   SpO2 99%   BMI 21.44 kg/m    CC:  Chief Complaint  Patient presents with   Itching on Arms, Back and Chest    Subjective:   HPI: Mallory Stewart is a 62 y.o. female presenting on 08/02/2022 for Itching on Arms, Back and Chest  History of alcoholic cirrhosis of the liver, pancreatic pseudocyst/infection  She is currently on antibiotic.. recent removal of  skin lesion ( possible infection)   boot for 5 weeks. Reviewed note from Dr. Nehemiah Massed.  ON doxycycline 14 days.   Has had recent pancreatitis flare after ERCP 1 week ago... treating with oxycodone... started having itching after this. Only took for 3 days.  She started benadryl daily.. not helping much.  On zyrtec daily. She presents today with itching on her arms, neck and scalp back and chest... started after oxycodone but lasting even though she is off.   Using Curel Exzema cream.. helped some. Benadryl helping slightly at max dose.  Scratches on body from ithcing at night.  ONly rash is slightly red bumps.    Has been feeling more panic given constant itching... too some of husband's  alprazolam.. helped her sleep through the night.  Prior to this she was unable to sleep more than 3-4 hours a day sdue to ithcing.  Less issues during the day.      Relevant past medical, surgical, family and social history reviewed and updated as indicated. Interim medical history since our last visit reviewed. Allergies and medications reviewed and updated. Outpatient Medications Prior to Visit  Medication Sig Dispense Refill   Blood Glucose Monitoring Suppl (ONE TOUCH ULTRA 2) w/Device KIT Use to check blood sugars two times day 1 each 0   CALCIUM PO Take 1 tablet by mouth in the morning and at bedtime.      cetirizine (ZYRTEC) 10 MG tablet Take 10 mg by mouth at bedtime.     Cholecalciferol (VITAMIN D3 PO) Take 2 capsules by mouth in the morning and at bedtime.     Cyanocobalamin (VITAMIN B-12) 5000 MCG SUBL Place 5,000 mcg under the tongue daily.     doxycycline (MONODOX) 100 MG capsule Take 1 capsule (100 mg total) by mouth 2 (two) times daily. 28 capsule 0   folic acid (FOLVITE) 1 MG tablet Take 1 tablet (1 mg total) by mouth daily. 30 tablet 0   furosemide (LASIX) 20 MG tablet Take 1 tablet (20 mg total) by mouth daily. 90 tablet 1   glucose blood (ONETOUCH ULTRA) test strip 1 each by Other route daily in the afternoon. Use as instructed 400 strip 3   Insulin Glargine (BASAGLAR KWIKPEN) 100 UNIT/ML Inject 30 Units into the skin daily. 30 mL 3   insulin lispro (HUMALOG KWIKPEN) 100 UNIT/ML KwikPen Max daily 30 units 30 mL 3   Insulin Pen Needle 32G X 4 MM MISC 1 Device by Does not apply route in the morning, at noon, in the evening, and at bedtime. 400 each 3   Lancets (ONETOUCH ULTRASOFT) lancets Use to check blood sugar two times a day. 200 each 3   mupirocin ointment (BACTROBAN) 2 % Apply 1 Application topically daily.  Qd to excision site 22 g 1   nortriptyline (PAMELOR) 50 MG capsule Take 1 capsule (50 mg total) by mouth at bedtime. 30 capsule 5   spironolactone (ALDACTONE) 50 MG tablet Take 1 tablet (50 mg total) by mouth daily. 90 tablet 1   traZODone (DESYREL) 100 MG tablet Take 0.5-1 tablets (50-100 mg total) by mouth at bedtime. 30 tablet 2   oxycodone (OXY-IR) 5 MG capsule Take 5 mg by mouth every 6 (six) hours as needed.     No facility-administered medications prior to visit.     Per HPI unless specifically indicated in ROS section below Review of Systems  Constitutional:  Negative for fatigue and fever.  HENT:  Negative for congestion.   Eyes:  Negative for pain.  Respiratory:  Negative for cough and shortness of breath.   Cardiovascular:  Negative for chest pain, palpitations  and leg swelling.  Gastrointestinal:  Negative for abdominal pain.  Genitourinary:  Negative for dysuria and vaginal bleeding.  Musculoskeletal:  Negative for back pain.  Neurological:  Negative for syncope, light-headedness and headaches.  Psychiatric/Behavioral:  Negative for dysphoric mood.    Objective:  BP 102/62   Pulse 79   Temp 97.7 F (36.5 C) (Temporal)   Ht 5\' 8"  (1.727 m)   Wt 141 lb (64 kg)   SpO2 99%   BMI 21.44 kg/m   Wt Readings from Last 3 Encounters:  08/02/22 141 lb (64 kg)  07/12/22 144 lb 3.2 oz (65.4 kg)  06/11/22 141 lb 6 oz (64.1 kg)      Physical Exam Constitutional:      General: She is not in acute distress.    Appearance: Normal appearance. She is well-developed and normal weight. She is not ill-appearing or toxic-appearing.  HENT:     Head: Normocephalic.     Right Ear: Hearing, tympanic membrane, ear canal and external ear normal. Tympanic membrane is not erythematous, retracted or bulging.     Left Ear: Hearing, tympanic membrane, ear canal and external ear normal. Tympanic membrane is not erythematous, retracted or bulging.     Nose: No mucosal edema or rhinorrhea.     Right Sinus: No maxillary sinus tenderness or frontal sinus tenderness.     Left Sinus: No maxillary sinus tenderness or frontal sinus tenderness.     Mouth/Throat:     Pharynx: Uvula midline.  Eyes:     General: Lids are normal. Lids are everted, no foreign bodies appreciated.     Conjunctiva/sclera: Conjunctivae normal.     Pupils: Pupils are equal, round, and reactive to light.  Neck:     Thyroid: No thyroid mass or thyromegaly.     Vascular: No carotid bruit.     Trachea: Trachea normal.  Cardiovascular:     Rate and Rhythm: Normal rate and regular rhythm.     Pulses: Normal pulses.     Heart sounds: Normal heart sounds, S1 normal and S2 normal. No murmur heard.    No friction rub. No gallop.  Pulmonary:     Effort: Pulmonary effort is normal. No tachypnea or  respiratory distress.     Breath sounds: Normal breath sounds. No decreased breath sounds, wheezing, rhonchi or rales.  Abdominal:     General: Bowel sounds are normal.     Palpations: Abdomen is soft.     Tenderness: There is no abdominal tenderness.  Musculoskeletal:     Cervical back: Normal range of motion and neck supple.  Skin:  General: Skin is warm and dry.     Findings: No rash.     Comments: No clear rash, multiple excoriations on arms and lower back.  Neurological:     Mental Status: She is alert.  Psychiatric:        Mood and Affect: Mood is not anxious or depressed.        Speech: Speech normal.        Behavior: Behavior normal. Behavior is cooperative.        Thought Content: Thought content normal.        Judgment: Judgment normal.       Results for orders placed or performed in visit on 07/03/22  Hemoglobin A1c  Result Value Ref Range   Hgb A1c MFr Bld 7.6 (H) 4.6 - 6.5 %  Protime-INR  Result Value Ref Range   INR 1.1 (H) 0.8 - 1.0 ratio   Prothrombin Time 12.3 9.6 - 13.1 sec  Comprehensive metabolic panel  Result Value Ref Range   Sodium 135 135 - 145 mEq/L   Potassium 4.9 3.5 - 5.1 mEq/L   Chloride 99 96 - 112 mEq/L   CO2 26 19 - 32 mEq/L   Glucose, Bld 115 (H) 70 - 99 mg/dL   BUN 36 (H) 6 - 23 mg/dL   Creatinine, Ser 0.95 0.40 - 1.20 mg/dL   Total Bilirubin 0.7 0.2 - 1.2 mg/dL   Alkaline Phosphatase 199 (H) 39 - 117 U/L   AST 41 (H) 0 - 37 U/L   ALT 31 0 - 35 U/L   Total Protein 8.2 6.0 - 8.3 g/dL   Albumin 4.3 3.5 - 5.2 g/dL   GFR 65.61 >60.00 mL/min   Calcium 10.8 (H) 8.4 - 10.5 mg/dL   *Note: Due to a large number of results and/or encounters for the requested time period, some results have not been displayed. A complete set of results can be found in Results Review.    Assessment and Plan  Drug side effects, initial encounter Assessment & Plan: Acute, itching started after several days of oxycodone for pancreatitis.  She is no longer on  oxycodone but possibly due to chronic liver issues she continues to have itching.  There is also seems to be a anxiety element to her itching.  We discussed options such as prednisone but this could possibly worsen her anxiety and she has not tolerated it well in the past.  We will instead have her stop Benadryl and try a course of 3 times daily hydroxyzine 25 mg with the ability to increase to 50 mg at bedtime to help her sleep. She will continue daily Zyrtec and I have provided triamcinolone 0.5% to use twice daily on extremely itchy areas.  We also discussed trimming her nails, wearing long sleeves and pants socks and possibly gloves to stop itching at night.  If itching and anxiety continue we can always consider changing to a course of alprazolam at bedtime which did seem to help her when she took her family members.     Other orders -     Triamcinolone Acetonide; Apply 1 Application topically 2 (two) times daily.  Dispense: 30 g; Refill: 0 -     hydrOXYzine Pamoate; Take 1-2 capsules (25-50 mg total) by mouth every 8 (eight) hours as needed for anxiety or itching.  Dispense: 30 capsule; Refill: 1    No follow-ups on file.   Eliezer Lofts, MD

## 2022-08-02 NOTE — Assessment & Plan Note (Signed)
Acute, itching started after several days of oxycodone for pancreatitis.  She is no longer on oxycodone but possibly due to chronic liver issues she continues to have itching.  There is also seems to be a anxiety element to her itching.  We discussed options such as prednisone but this could possibly worsen her anxiety and she has not tolerated it well in the past.  We will instead have her stop Benadryl and try a course of 3 times daily hydroxyzine 25 mg with the ability to increase to 50 mg at bedtime to help her sleep. She will continue daily Zyrtec and I have provided triamcinolone 0.5% to use twice daily on extremely itchy areas.  We also discussed trimming her nails, wearing long sleeves and pants socks and possibly gloves to stop itching at night.  If itching and anxiety continue we can always consider changing to a course of alprazolam at bedtime which did seem to help her when she took her family members.

## 2022-08-07 ENCOUNTER — Encounter: Payer: Self-pay | Admitting: Dermatology

## 2022-08-07 ENCOUNTER — Ambulatory Visit (INDEPENDENT_AMBULATORY_CARE_PROVIDER_SITE_OTHER): Payer: 59 | Admitting: Dermatology

## 2022-08-07 DIAGNOSIS — Z4802 Encounter for removal of sutures: Secondary | ICD-10-CM

## 2022-08-07 DIAGNOSIS — L729 Follicular cyst of the skin and subcutaneous tissue, unspecified: Secondary | ICD-10-CM

## 2022-08-07 NOTE — Progress Notes (Signed)
   Follow-Up Visit   Subjective  Mallory Stewart is a 62 y.o. female who presents for the following: Suture removal at the L post lat neck  Pathology showed a benign cyst  The following portions of the chart were reviewed this encounter and updated as appropriate: medications, allergies, medical history  Review of Systems:  No other skin or systemic complaints except as noted in HPI or Assessment and Plan.  Objective  Well appearing patient in no apparent distress; mood and affect are within normal limits.  Areas Examined: The face and neck Relevant physical exam findings are noted in the Assessment and Plan.   Assessment & Plan    Encounter for Removal of Sutures post op cyst excision of the neck - Incision site is clean, dry and intact. - Wound cleansed, sutures removed, wound cleansed and steri strips applied.  - Discussed pathology results showing a benign cyst - Patient advised to keep steri-strips dry until they fall off. - Scars remodel for a full year. - Once steri-strips fall off, patient can apply over-the-counter silicone scar cream once to twice a day to help with scar remodeling if desired. - Patient advised to call with any concerns or if they notice any new or changing lesions.  Return if symptoms worsen or fail to improve.  Luther Redo, CMA, am acting as scribe for Sarina Ser, MD .  Documentation: I have reviewed the above documentation for accuracy and completeness, and I agree with the above.  Sarina Ser, MD

## 2022-08-07 NOTE — Patient Instructions (Signed)
Due to recent changes in healthcare laws, you may see results of your pathology and/or laboratory studies on MyChart before the doctors have had a chance to review them. We understand that in some cases there may be results that are confusing or concerning to you. Please understand that not all results are received at the same time and often the doctors may need to interpret multiple results in order to provide you with the best plan of care or course of treatment. Therefore, we ask that you please give us 2 business days to thoroughly review all your results before contacting the office for clarification. Should we see a critical lab result, you will be contacted sooner.   If You Need Anything After Your Visit  If you have any questions or concerns for your doctor, please call our main line at [phone] and press option 4 to reach your doctor's medical assistant. If no one answers, please leave a voicemail as directed and we will return your call as soon as possible. Messages left after 4 pm will be answered the following business day.   You may also send us a message via MyChart. We typically respond to MyChart messages within 1-2 business days.  For prescription refills, please ask your pharmacy to contact our office. Our fax number is [phone].  If you have an urgent issue when the clinic is closed that cannot wait until the next business day, you can page your doctor at the number below.    Please note that while we do our best to be available for urgent issues outside of office hours, we are not available 24/7.   If you have an urgent issue and are unable to reach us, you may choose to seek medical care at your doctor's office, retail clinic, urgent care center, or emergency room.  If you have a medical emergency, please immediately call 911 or go to the emergency department.  Pager Numbers  - Dr. Kowalski: [phone]  - Dr. Moye: [phone]  - Dr. Stewart:  [phone]  In the event of inclement weather, please call our main line at [phone] for an update on the status of any delays or closures.  Dermatology Medication Tips: Please keep the boxes that topical medications come in in order to help keep track of the instructions about where and how to use these. Pharmacies typically print the medication instructions only on the boxes and not directly on the medication tubes.   If your medication is too expensive, please contact our office at [phone] option 4 or send us a message through MyChart.   We are unable to tell what your co-pay for medications will be in advance as this is different depending on your insurance coverage. However, we may be able to find a substitute medication at lower cost or fill out paperwork to get insurance to cover a needed medication.   If a prior authorization is required to get your medication covered by your insurance company, please allow us 1-2 business days to complete this process.  Drug prices often vary depending on where the prescription is filled and some pharmacies may offer cheaper prices.  The website www.goodrx.com contains coupons for medications through different pharmacies. The prices here do not account for what the cost may be with help from insurance (it may be cheaper with your insurance), but the website can give you the price if you did not use any insurance.  - You can print the associated coupon and take it with   your prescription to the pharmacy.  - You may also stop by our office during regular business hours and pick up a GoodRx coupon card.  - If you need your prescription sent electronically to a different pharmacy, notify our office through [facility] MyChart or by phone at [phone] option 4.     Si Usted Necesita Algo Despus de Su Visita  Tambin puede enviarnos un mensaje a travs de MyChart. Por lo general respondemos a los mensajes de MyChart en el transcurso de 1 a 2  das hbiles.  Para renovar recetas, por favor pida a su farmacia que se ponga en contacto con nuestra oficina. Nuestro nmero de fax es el [phone].  Si tiene un asunto urgente cuando la clnica est cerrada y que no puede esperar hasta el siguiente da hbil, puede llamar/localizar a su doctor(a) al nmero que aparece a continuacin.   Por favor, tenga en cuenta que aunque hacemos todo lo posible para estar disponibles para asuntos urgentes fuera del horario de oficina, no estamos disponibles las 24 horas del da, los 7 das de la semana.   Si tiene un problema urgente y no puede comunicarse con nosotros, puede optar por buscar atencin mdica  en el consultorio de su doctor(a), en una clnica privada, en un centro de atencin urgente o en una sala de emergencias.  Si tiene una emergencia mdica, por favor llame inmediatamente al 911 o vaya a la sala de emergencias.  Nmeros de bper  - Dr. Kowalski: [phone]  - Dra. Moye: [phone]  - Dra. Stewart: [phone]  En caso de inclemencias del tiempo, por favor llame a nuestra lnea principal al [phone] para una actualizacin sobre el estado de cualquier retraso o cierre.  Consejos para la medicacin en dermatologa: Por favor, guarde las cajas en las que vienen los medicamentos de uso tpico para ayudarle a seguir las instrucciones sobre dnde y cmo usarlos. Las farmacias generalmente imprimen las instrucciones del medicamento slo en las cajas y no directamente en los tubos del medicamento.   Si su medicamento es muy caro, por favor, pngase en contacto con nuestra oficina llamando al [phone] y presione la opcin 4 o envenos un mensaje a travs de MyChart.   No podemos decirle cul ser su copago por los medicamentos por adelantado ya que esto es diferente dependiendo de la cobertura de su seguro. Sin embargo, es posible que podamos encontrar un medicamento sustituto a menor costo o llenar un formulario para que el  seguro cubra el medicamento que se considera necesario.   Si se requiere una autorizacin previa para que su compaa de seguros cubra su medicamento, por favor permtanos de 1 a 2 das hbiles para completar este proceso.  Los precios de los medicamentos varan con frecuencia dependiendo del lugar de dnde se surte la receta y alguna farmacias pueden ofrecer precios ms baratos.  El sitio web www.goodrx.com tiene cupones para medicamentos de diferentes farmacias. Los precios aqu no tienen en cuenta lo que podra costar con la ayuda del seguro (puede ser ms barato con su seguro), pero el sitio web puede darle el precio si no utiliz ningn seguro.  - Puede imprimir el cupn correspondiente y llevarlo con su receta a la farmacia.  - Tambin puede pasar por nuestra oficina durante el horario de atencin regular y recoger una tarjeta de cupones de GoodRx.  - Si necesita que su receta se enve electrnicamente a una farmacia diferente, informe a nuestra oficina a travs de MyChart de [facility]   o por telfono llamando al [phone] y presione la opcin 4.  

## 2022-08-08 ENCOUNTER — Encounter: Payer: Self-pay | Admitting: Family Medicine

## 2022-08-09 ENCOUNTER — Other Ambulatory Visit: Payer: Self-pay | Admitting: Family Medicine

## 2022-08-09 MED ORDER — HYDROXYZINE PAMOATE 50 MG PO CAPS: 50.0000 mg | ORAL_CAPSULE | Freq: Three times a day (TID) | ORAL | 0 refills | Status: DC | PRN

## 2022-08-15 ENCOUNTER — Ambulatory Visit (INDEPENDENT_AMBULATORY_CARE_PROVIDER_SITE_OTHER): Payer: 59 | Admitting: Podiatry

## 2022-08-15 ENCOUNTER — Encounter: Payer: Self-pay | Admitting: Podiatry

## 2022-08-15 DIAGNOSIS — E1165 Type 2 diabetes mellitus with hyperglycemia: Secondary | ICD-10-CM

## 2022-08-15 DIAGNOSIS — L03032 Cellulitis of left toe: Secondary | ICD-10-CM | POA: Diagnosis not present

## 2022-08-15 DIAGNOSIS — T148XXA Other injury of unspecified body region, initial encounter: Secondary | ICD-10-CM

## 2022-08-15 DIAGNOSIS — L02612 Cutaneous abscess of left foot: Secondary | ICD-10-CM | POA: Diagnosis not present

## 2022-08-15 DIAGNOSIS — Z794 Long term (current) use of insulin: Secondary | ICD-10-CM | POA: Diagnosis not present

## 2022-08-15 NOTE — Progress Notes (Signed)
Subjective:   Patient ID: Mallory Stewart, female   DOB: 62 y.o.   MRN: [id number]   HPI Patient presents after seeing Dr. Allena Katz with a ulceration of the left big toe.  States it is doing better but she wanted to make sure everything looked okay with minimal discomfort   ROS      Objective:  Physical Exam  Neurovascular status unchanged A1c has been excellent with keratotic lesion subleft hallux measuring about 2 cm x 1.5 cm that is improving with Betadine with still some thickened tissue around the area no proximal edema erythema drainage noted     Assessment:  Overall improving from having ulceration left hallux that was not taken clear about her proper rate with history of diabetes     Plan:  H&P reviewed due to great length did debride some of the edges but the center portion relieving and I am to have her do wet-to-dry soaks to try to see if we can stimulate the tissue.  Continue surgical shoe see Dr. Allena Katz back for regular visit in the next few weeks or earlier if needed

## 2022-08-27 ENCOUNTER — Other Ambulatory Visit: Payer: Self-pay | Admitting: Internal Medicine

## 2022-08-27 NOTE — Telephone Encounter (Signed)
Please advise Sir? 

## 2022-08-28 ENCOUNTER — Other Ambulatory Visit: Payer: Self-pay | Admitting: Internal Medicine

## 2022-08-28 NOTE — Telephone Encounter (Signed)
Please advise Sir, thank you. 

## 2022-08-31 ENCOUNTER — Ambulatory Visit (INDEPENDENT_AMBULATORY_CARE_PROVIDER_SITE_OTHER): Payer: 59 | Admitting: Podiatry

## 2022-08-31 DIAGNOSIS — L02612 Cutaneous abscess of left foot: Secondary | ICD-10-CM | POA: Diagnosis not present

## 2022-08-31 DIAGNOSIS — L03032 Cellulitis of left toe: Secondary | ICD-10-CM

## 2022-08-31 DIAGNOSIS — E1165 Type 2 diabetes mellitus with hyperglycemia: Secondary | ICD-10-CM | POA: Diagnosis not present

## 2022-08-31 DIAGNOSIS — Z794 Long term (current) use of insulin: Secondary | ICD-10-CM

## 2022-08-31 DIAGNOSIS — T148XXA Other injury of unspecified body region, initial encounter: Secondary | ICD-10-CM

## 2022-08-31 NOTE — Progress Notes (Signed)
Subjective:  Patient ID: Mallory Stewart, female    DOB: Jan 10, 1961,  MRN: [id number]  Chief Complaint  Patient presents with   Callouses    62 y.o. female presents with the above complaint.  Patient present with left hallux blister formation.  She states she is doing a lot better denies any other acute complaints   Review of Systems: Negative except as noted in the HPI. Denies N/V/F/Ch.  Past Medical History:  Diagnosis Date   Alcohol-induced chronic pancreatitis (HCC)    Alcoholic cirrhosis (HCC)    B12 deficiency    Back abscess    C. difficile colitis    Concussion    DDD (degenerative disc disease), cervical    Diabetes (HCC)    DKA, type 2 (HCC)    Gastric outlet obstruction 08/12/2018   Hypertension    Neuropathy    Pancreatic pseudocyst/cyst 05/06/2013   Seasonal allergies     Current Outpatient Medications:    Blood Glucose Monitoring Suppl (ONE TOUCH ULTRA 2) w/Device KIT, Use to check blood sugars two times day, Disp: 1 each, Rfl: 0   CALCIUM PO, Take 1 tablet by mouth in the morning and at bedtime., Disp: , Rfl:    cetirizine (ZYRTEC) 10 MG tablet, Take 10 mg by mouth at bedtime., Disp: , Rfl:    Cholecalciferol (VITAMIN D3 PO), Take 2 capsules by mouth in the morning and at bedtime., Disp: , Rfl:    Cyanocobalamin (VITAMIN B-12) 5000 MCG SUBL, Place 5,000 mcg under the tongue daily., Disp: , Rfl:    doxycycline (MONODOX) 100 MG capsule, Take 1 capsule (100 mg total) by mouth 2 (two) times daily., Disp: 28 capsule, Rfl: 0   folic acid (FOLVITE) 1 MG tablet, Take 1 tablet (1 mg total) by mouth daily., Disp: 30 tablet, Rfl: 0   furosemide (LASIX) 20 MG tablet, TAKE 1 TABLET(20 MG) BY MOUTH DAILY, Disp: 90 tablet, Rfl: 1   glucose blood (ONETOUCH ULTRA) test strip, 1 each by Other route daily in the afternoon. Use as instructed, Disp: 400 strip, Rfl: 3   hydrOXYzine (VISTARIL) 50 MG capsule, Take 1-2 capsules (50-100 mg total) by mouth every 8 (eight) hours as  needed for anxiety or itching., Disp: 15 capsule, Rfl: 0   Insulin Glargine (BASAGLAR KWIKPEN) 100 UNIT/ML, Inject 30 Units into the skin daily., Disp: 30 mL, Rfl: 3   insulin lispro (HUMALOG KWIKPEN) 100 UNIT/ML KwikPen, Max daily 30 units, Disp: 30 mL, Rfl: 3   Insulin Pen Needle 32G X 4 MM MISC, 1 Device by Does not apply route in the morning, at noon, in the evening, and at bedtime., Disp: 400 each, Rfl: 3   Lancets (ONETOUCH ULTRASOFT) lancets, Use to check blood sugar two times a day., Disp: 200 each, Rfl: 3   mupirocin ointment (BACTROBAN) 2 %, Apply 1 Application topically daily. Qd to excision site, Disp: 22 g, Rfl: 1   nortriptyline (PAMELOR) 50 MG capsule, Take 1 capsule (50 mg total) by mouth at bedtime., Disp: 30 capsule, Rfl: 5   spironolactone (ALDACTONE) 50 MG tablet, TAKE 1 TABLET(50 MG) BY MOUTH DAILY, Disp: 90 tablet, Rfl: 0   traZODone (DESYREL) 100 MG tablet, Take 0.5-1 tablets (50-100 mg total) by mouth at bedtime., Disp: 30 tablet, Rfl: 2   triamcinolone cream (KENALOG) 0.5 %, Apply 1 Application topically 2 (two) times daily., Disp: 30 g, Rfl: 0  Social History   Tobacco Use  Smoking Status Never  Smokeless Tobacco Never  Allergies  Allergen Reactions   Ceftriaxone Hives    Immediate bright red itchy rash on bilateral legs after starting infusion.     Gluten Meal Other (See Comments)    Stomach distress   Oxycodone Itching   Zocor [Simvastatin] Other (See Comments)    Elevated lfts?   Objective:  There were no vitals filed for this visit. There is no height or weight on file to calculate BMI. Constitutional Well developed. Well nourished.  Vascular Dorsalis pedis pulses palpable bilaterally. Posterior tibial pulses palpable bilaterally. Capillary refill normal to all digits.  No cyanosis or clubbing noted. Pedal hair growth normal.  Neurologic Normal speech. Oriented to person, place, and time. Epicritic sensation to light touch grossly present  bilaterally.  Dermatologic No further ulceration noted.  No further epidermolysis noted.  No signs of infection  Orthopedic: Normal joint ROM without pain or crepitus bilaterally. No visible deformities. No bony tenderness.   Radiographs: None Assessment:   No diagnosis found.  Plan:  Patient was evaluated and treated and all questions answered.  Left hallux superficial ulceration/epidermal lysis secondary to friction blister/diabetes -All questions and concerns were discussed with the patient in extensive detail.  Given -Clinically healed and completely epithelialized if any foot and ankle issues on future she will come back and see me.  I discussed shoe gear modification with her extensively  No follow-ups on file.

## 2022-09-12 ENCOUNTER — Ambulatory Visit: Payer: 59

## 2022-10-03 ENCOUNTER — Encounter: Payer: Self-pay | Admitting: Internal Medicine

## 2022-10-10 ENCOUNTER — Other Ambulatory Visit: Payer: Self-pay | Admitting: Internal Medicine

## 2022-10-10 DIAGNOSIS — K8689 Other specified diseases of pancreas: Secondary | ICD-10-CM

## 2022-10-10 DIAGNOSIS — K7031 Alcoholic cirrhosis of liver with ascites: Secondary | ICD-10-CM

## 2022-10-10 NOTE — Telephone Encounter (Signed)
I left [name] a detailed message on her cell voicemail that the rx has been refilled and to please come for labs.

## 2022-10-10 NOTE — Telephone Encounter (Signed)
I have refilled the spironolactone  Please let her know and ask her to come for labs that have been ordered at her convenience in the next week or 2   Orders Placed This Encounter  Procedures   Comp Met (CMET)   Lipase   CBC   INR/PT

## 2022-10-10 NOTE — Telephone Encounter (Signed)
Please advise Sir, thank you. 

## 2022-10-11 NOTE — Telephone Encounter (Signed)
Left her another detailed message, trying to catch her at a different time of day.

## 2022-10-15 ENCOUNTER — Encounter: Payer: Self-pay | Admitting: Internal Medicine

## 2022-10-15 NOTE — Telephone Encounter (Signed)
Left another detailed message for her to please come get her labs drawn.

## 2022-10-16 ENCOUNTER — Ambulatory Visit (INDEPENDENT_AMBULATORY_CARE_PROVIDER_SITE_OTHER): Payer: 59 | Admitting: Family Medicine

## 2022-10-16 VITALS — BP 120/70 | HR 80 | Temp 98.2°F | Ht 68.0 in | Wt 147.2 lb

## 2022-10-16 DIAGNOSIS — R1031 Right lower quadrant pain: Secondary | ICD-10-CM | POA: Diagnosis not present

## 2022-10-16 DIAGNOSIS — M25551 Pain in right hip: Secondary | ICD-10-CM | POA: Diagnosis not present

## 2022-10-16 MED ORDER — DICLOFENAC SODIUM 75 MG PO TBEC: 75.0000 mg | DELAYED_RELEASE_TABLET | Freq: Two times a day (BID) | ORAL | 0 refills | Status: DC

## 2022-10-16 NOTE — Patient Instructions (Addendum)
Start home PT for low back and right hip bursitis.  We will move forward with [place] to evaluate right lower abdominal pain. Start diclofenac 75 mg twice daily x 2 weeks.

## 2022-10-16 NOTE — Progress Notes (Signed)
Patient ID: Mallory Stewart, female    DOB: [birth year], 62 y.o.   MRN: [id number]  This visit was conducted in person.  BP 120/70 (BP Location: Right Arm, Patient Position: Sitting, Cuff Size: Normal)   Pulse 80   Temp 98.2 F (36.8 C) (Temporal)   Ht 5\' 8"  (1.727 m)   Wt 147 lb 4 oz (66.8 kg)   SpO2 98%   BMI 22.39 kg/m    CC:  Chief Complaint  Patient presents with   Bloated    Concerned she might has ascites again   Joint Pain    Fingers and Knees   Gait Issue    Feels like gait is off    Subjective:   HPI: Mallory Stewart is a 62 y.o. female presenting on 10/16/2022 for Bloated (Concerned she might has ascites again), Joint Pain (Fingers and Knees), and Gait Issue (Feels like gait is off)  Patient with chronic GI history including History of alcoholic cirrhosis of the liver, pancreatic pseudocyst/infection   Next visit is ERCP 11/15/2022   She has been having  constant pain in right low back, pain radiates around hip. Lateral hip feels swollen.  Started after wrenching body suddenly to avoid a fall.  Pain worse with walking. Pain with lying on left side.  She has noted hand stiffness and joint pain, knee pain  bilaterally.. No noted  redness or swelling of joints.  Knees feels unsteady    Has noted some increase in pressure in low abd in last week.      Right lower quadrant pain x 10 days ago  No constipation, No N/V, no urinary symptoms. No fever.  Relevant past medical, surgical, family and social history reviewed and updated as indicated. Interim medical history since our last visit reviewed. Allergies and medications reviewed and updated. Outpatient Medications Prior to Visit  Medication Sig Dispense Refill   Blood Glucose Monitoring Suppl (ONE TOUCH ULTRA 2) w/Device KIT Use to check blood sugars two times day 1 each 0   CALCIUM PO Take 1 tablet by mouth in the morning and at bedtime.     cetirizine (ZYRTEC) 10 MG tablet Take 10 mg by mouth at  bedtime.     Cholecalciferol (VITAMIN D3 PO) Take 2 capsules by mouth in the morning and at bedtime.     Cyanocobalamin (VITAMIN B-12) 5000 MCG SUBL Place 5,000 mcg under the tongue daily.     furosemide (LASIX) 20 MG tablet TAKE 1 TABLET(20 MG) BY MOUTH DAILY 90 tablet 1   glucose blood (ONETOUCH ULTRA) test strip 1 each by Other route daily in the afternoon. Use as instructed 400 strip 3   hydrOXYzine (VISTARIL) 50 MG capsule Take 1-2 capsules (50-100 mg total) by mouth every 8 (eight) hours as needed for anxiety or itching. 15 capsule 0   Insulin Glargine (BASAGLAR KWIKPEN) 100 UNIT/ML Inject 30 Units into the skin daily. 30 mL 3   insulin lispro (HUMALOG KWIKPEN) 100 UNIT/ML KwikPen Max daily 30 units 30 mL 3   Insulin Pen Needle 32G X 4 MM MISC 1 Device by Does not apply route in the morning, at noon, in the evening, and at bedtime. 400 each 3   Lancets (ONETOUCH ULTRASOFT) lancets Use to check blood sugar two times a day. 200 each 3   spironolactone (ALDACTONE) 50 MG tablet TAKE 1 TABLET(50 MG) BY MOUTH DAILY 90 tablet 0   doxycycline (MONODOX) 100 MG capsule Take 1 capsule (100 mg total)  by mouth 2 (two) times daily. 28 capsule 0   folic acid (FOLVITE) 1 MG tablet Take 1 tablet (1 mg total) by mouth daily. 30 tablet 0   mupirocin ointment (BACTROBAN) 2 % Apply 1 Application topically daily. Qd to excision site 22 g 1   nortriptyline (PAMELOR) 50 MG capsule Take 1 capsule (50 mg total) by mouth at bedtime. 30 capsule 5   traZODone (DESYREL) 100 MG tablet Take 0.5-1 tablets (50-100 mg total) by mouth at bedtime. 30 tablet 2   triamcinolone cream (KENALOG) 0.5 % Apply 1 Application topically 2 (two) times daily. 30 g 0   No facility-administered medications prior to visit.     Per HPI unless specifically indicated in ROS section below Review of Systems  Constitutional:  Negative for fatigue and fever.  HENT:  Negative for congestion.   Eyes:  Negative for pain.  Respiratory:  Negative  for cough and shortness of breath.   Cardiovascular:  Negative for chest pain, palpitations and leg swelling.  Gastrointestinal:  Positive for abdominal distention and abdominal pain.  Genitourinary:  Negative for dysuria and vaginal bleeding.  Musculoskeletal:  Positive for arthralgias and back pain.  Neurological:  Negative for syncope, light-headedness and headaches.  Psychiatric/Behavioral:  Negative for dysphoric mood.    Objective:  BP 120/70 (BP Location: Right Arm, Patient Position: Sitting, Cuff Size: Normal)   Pulse 80   Temp 98.2 F (36.8 C) (Temporal)   Ht 5\' 8"  (1.727 m)   Wt 147 lb 4 oz (66.8 kg)   SpO2 98%   BMI 22.39 kg/m   Wt Readings from Last 3 Encounters:  10/16/22 147 lb 4 oz (66.8 kg)  08/02/22 141 lb (64 kg)  07/12/22 144 lb 3.2 oz (65.4 kg)      Physical Exam Constitutional:      General: She is not in acute distress.    Appearance: Normal appearance. She is well-developed. She is cachectic. She is not ill-appearing or toxic-appearing.  HENT:     Head: Normocephalic.     Right Ear: Hearing, tympanic membrane, ear canal and external ear normal. Tympanic membrane is not erythematous, retracted or bulging.     Left Ear: Hearing, tympanic membrane, ear canal and external ear normal. Tympanic membrane is not erythematous, retracted or bulging.     Nose: No mucosal edema or rhinorrhea.     Right Sinus: No maxillary sinus tenderness or frontal sinus tenderness.     Left Sinus: No maxillary sinus tenderness or frontal sinus tenderness.     Mouth/Throat:     Pharynx: Uvula midline.  Eyes:     General: Lids are normal. Lids are everted, no foreign bodies appreciated.     Conjunctiva/sclera: Conjunctivae normal.     Pupils: Pupils are equal, round, and reactive to light.  Neck:     Thyroid: No thyroid mass or thyromegaly.     Vascular: No carotid bruit.     Trachea: Trachea normal.  Cardiovascular:     Rate and Rhythm: Normal rate and regular rhythm.      Pulses: Normal pulses.     Heart sounds: Normal heart sounds, S1 normal and S2 normal. No murmur heard.    No friction rub. No gallop.  Pulmonary:     Effort: Pulmonary effort is normal. No tachypnea or respiratory distress.     Breath sounds: Normal breath sounds. No decreased breath sounds, wheezing, rhonchi or rales.  Abdominal:     General: Bowel sounds are normal.  Palpations: Abdomen is soft.     Tenderness: There is abdominal tenderness in the right lower quadrant. There is no right CVA tenderness, left CVA tenderness, guarding or rebound.  Musculoskeletal:     Cervical back: Normal range of motion and neck supple.     Thoracic back: Normal.     Lumbar back: Tenderness present. Decreased range of motion. Negative right straight leg raise test and negative left straight leg raise test.     Right hip: Tenderness present. No bony tenderness. Decreased range of motion.     Comments:  Ttp over latearl hip at bursa.Ttp in right sciatic notch  Skin:    General: Skin is warm and dry.     Findings: No rash.  Neurological:     Mental Status: She is alert.  Psychiatric:        Mood and Affect: Mood is not anxious or depressed.        Speech: Speech normal.        Behavior: Behavior normal. Behavior is cooperative.        Thought Content: Thought content normal.        Judgment: Judgment normal.       Results for orders placed or performed in visit on 10/16/22  HM DIABETES EYE EXAM  Result Value Ref Range   [facility] Diabetic Eye Exam No Retinopathy No Retinopathy   *Note: Due to a large number of results and/or encounters for the requested time period, some results have not been displayed. A complete set of results can be found in Results Review.    Assessment and Plan  RLQ abdominal pain Assessment & Plan: Acute,  Patient has complicated medical history including ascites from alcoholic cirrhosis and pancreatic pseudocyst/infection Will evaluate with pelvic ultrasound to rule out  ovarian etiology and reaccumulation of ascites  Orders: -     US PELVIC COMPLETE WITH TRANSVAGINAL; Future  Right hip pain Assessment & Plan: Acute, pain in buttock and lateral hip most consistent with trochanteric bursitis.  Recommend ice, home physical therapy and trial of anti-inflammatory.  Start diclofenac 75 mg p.o. twice daily.  No red flags or falls suggesting indication for x-ray.  If not improving as expected we will consider evaluating further.   Other orders -     Diclofenac Sodium; Take 1 tablet (75 mg total) by mouth 2 (two) times daily.  Dispense: 30 tablet; Refill: 0    No follow-ups on file.   Kerby Nora, MD

## 2022-10-18 ENCOUNTER — Ambulatory Visit: Payer: 59 | Admitting: Family Medicine

## 2022-10-19 ENCOUNTER — Ambulatory Visit
Admission: RE | Admit: 2022-10-19 | Discharge: 2022-10-19 | Disposition: A | Discharge status: Home / Self Care | Payer: 59 | Source: Ambulatory Visit | Attending: Family Medicine | Admitting: Family Medicine

## 2022-10-19 DIAGNOSIS — M25551 Pain in right hip: Secondary | ICD-10-CM | POA: Insufficient documentation

## 2022-10-19 DIAGNOSIS — R1031 Right lower quadrant pain: Secondary | ICD-10-CM | POA: Insufficient documentation

## 2022-10-19 DIAGNOSIS — Z1231 Encounter for screening mammogram for malignant neoplasm of breast: Secondary | ICD-10-CM

## 2022-10-19 NOTE — Assessment & Plan Note (Signed)
Acute,  Patient has complicated medical history including ascites from alcoholic cirrhosis and pancreatic pseudocyst/infection Will evaluate with pelvic ultrasound to rule out ovarian etiology and reaccumulation of ascites

## 2022-10-19 NOTE — Assessment & Plan Note (Signed)
Acute, pain in buttock and lateral hip most consistent with trochanteric bursitis.  Recommend ice, home physical therapy and trial of anti-inflammatory.  Start diclofenac 75 mg p.o. twice daily.  No red flags or falls suggesting indication for x-ray.  If not improving as expected we will consider evaluating further.

## 2022-10-24 ENCOUNTER — Encounter: Payer: Self-pay | Admitting: Family Medicine

## 2022-10-31 ENCOUNTER — Other Ambulatory Visit: Payer: Self-pay

## 2022-10-31 DIAGNOSIS — E119 Type 2 diabetes mellitus without complications: Secondary | ICD-10-CM

## 2022-10-31 MED ORDER — ONETOUCH ULTRA VI STRP
ORAL_STRIP | 5 refills | Status: DC
Start: 2022-10-31 — End: 2023-06-26

## 2022-11-05 ENCOUNTER — Ambulatory Visit
Admission: RE | Admit: 2022-11-05 | Discharge: 2022-11-05 | Disposition: A | Discharge status: Home / Self Care | Payer: 59 | Source: Ambulatory Visit | Attending: Family Medicine | Admitting: Family Medicine

## 2022-11-05 DIAGNOSIS — R1031 Right lower quadrant pain: Secondary | ICD-10-CM | POA: Insufficient documentation

## 2022-11-12 ENCOUNTER — Ambulatory Visit (INDEPENDENT_AMBULATORY_CARE_PROVIDER_SITE_OTHER): Payer: 59

## 2022-11-12 ENCOUNTER — Ambulatory Visit (INDEPENDENT_AMBULATORY_CARE_PROVIDER_SITE_OTHER): Payer: 59 | Admitting: Podiatry

## 2022-11-12 ENCOUNTER — Other Ambulatory Visit: Payer: Self-pay | Admitting: Podiatry

## 2022-11-12 DIAGNOSIS — E1140 Type 2 diabetes mellitus with diabetic neuropathy, unspecified: Secondary | ICD-10-CM | POA: Diagnosis not present

## 2022-11-12 DIAGNOSIS — M779 Enthesopathy, unspecified: Secondary | ICD-10-CM

## 2022-11-12 DIAGNOSIS — L84 Corns and callosities: Secondary | ICD-10-CM | POA: Diagnosis not present

## 2022-11-12 DIAGNOSIS — E1149 Type 2 diabetes mellitus with other diabetic neurological complication: Secondary | ICD-10-CM | POA: Diagnosis not present

## 2022-11-12 NOTE — Progress Notes (Signed)
Subjective:   Patient ID: Mallory Stewart, female   DOB: 62 y.o.   MRN: [id number]   HPI Patient is a long-term diabetic with a chronic irritation of the left big toe which has gotten worse and has been present for about 5 to 6 months.  She needs to be active and does have neuropathic changes   ROS      Objective:  Physical Exam  Large keratotic lesion with slight breakdown of tissue left hallux localized to medial plantar surface with dried blood underneath the area along with neuropathic changes consistent with long-term diabetes     Assessment:  Long-term diabetes with neuropathic changes with pressure against the left toe with history of having fracture of the left and right ankle which is probably caused change in gait     Plan:  H&P reviewed at great length this is a difficult problem due to the fracture she has had in the past at this point I have recommended orthotics and we will put a graphite extension on the left to try to rebalance the big toe and hopefully prevent continued breakdown of tissue.  I did debride courtesy I did not note any current tissue it appears to be strictly keratotic with inflammatory changes also of the inner phalangeal joint consistent with capsulitis

## 2022-11-26 ENCOUNTER — Encounter: Payer: Self-pay | Admitting: Podiatry

## 2022-11-28 ENCOUNTER — Encounter: Payer: Self-pay | Admitting: Podiatry

## 2022-11-28 ENCOUNTER — Telehealth: Payer: Self-pay | Admitting: *Deleted

## 2022-11-28 ENCOUNTER — Ambulatory Visit (INDEPENDENT_AMBULATORY_CARE_PROVIDER_SITE_OTHER): Payer: 59 | Admitting: Podiatry

## 2022-11-28 VITALS — BP 132/96 | HR 73 | Ht 68.0 in

## 2022-11-28 DIAGNOSIS — L97522 Non-pressure chronic ulcer of other part of left foot with fat layer exposed: Secondary | ICD-10-CM

## 2022-11-28 DIAGNOSIS — L03032 Cellulitis of left toe: Secondary | ICD-10-CM | POA: Diagnosis not present

## 2022-11-28 DIAGNOSIS — E0843 Diabetes mellitus due to underlying condition with diabetic autonomic (poly)neuropathy: Secondary | ICD-10-CM

## 2022-11-28 MED ORDER — DOXYCYCLINE HYCLATE 100 MG PO TABS: 100.0000 mg | ORAL_TABLET | Freq: Two times a day (BID) | ORAL | 0 refills | Status: DC

## 2022-11-28 MED ORDER — GENTAMICIN SULFATE 0.1 % EX OINT: 1.0000 | TOPICAL_OINTMENT | Freq: Two times a day (BID) | CUTANEOUS | 1 refills | Status: DC

## 2022-11-28 NOTE — Progress Notes (Signed)
No chief complaint on file.   Subjective:  62 y.o. female with PMHx of diabetes mellitus presenting today for follow-up evaluation of an ulcer to the left great toe.  Patient has not been applying anything to the wound except for a Band-Aid.  She is currently WBAT in a surgical shoe.  She says that wearing surgical shoe does help alleviate some of the pain.   Past Medical History:  Diagnosis Date   Alcohol-induced chronic pancreatitis (HCC)    Alcoholic cirrhosis (HCC)    B12 deficiency    Back abscess    C. difficile colitis    Concussion    DDD (degenerative disc disease), cervical    Diabetes (HCC)    DKA, type 2 (HCC)    Gastric outlet obstruction 08/12/2018   Hypertension    Neuropathy    Pancreatic pseudocyst/cyst 05/06/2013   Seasonal allergies     Past Surgical History:  Procedure Laterality Date   ANKLE ARTHROSCOPY Right 12/20/2020   Procedure: RIGHT ANKLE ARTHROSCOPIC DEBRIDEMENT;  Surgeon: Nadara Mustard, MD;  Location: [facility];  Service: Orthopedics;  Laterality: Right;   BILIARY BRUSHING  01/27/2022   Procedure: BILIARY BRUSHING;  Surgeon: Meridee Score Netty Starring., MD;  Location: [facility];  Service: Gastroenterology;;   BIOPSY  01/19/2019   Procedure: BIOPSY;  Surgeon: Lemar Lofty., MD;  Location: [facility] END[facility]COPY;  Service: Gastroenterology;;   CHOLECYSTECTOMY N/A 07/28/2016   Procedure: LAPAROSCOPIC CHOLECYSTECTOMY WITH INTRAOPERATIVE CHOLANGIOGRAM;  Surgeon: Rodman Pickle, MD;  Location: [facility] OR;  Service: General;  Laterality: N/A;   ERCP N/A 01/27/2022   Procedure: ENDOSCOPIC RETROGRADE CHOLANGIOPANCREATOGRAPHY (ERCP);  Surgeon: Lemar Lofty., MD;  Location: [facility];  Service: Gastroenterology;  Laterality: N/A;   ESOPHAGOGASTRODUODENOSCOPY (EGD) WITH PROPOFOL N/A 08/13/2018   Procedure: ESOPHAGOGASTRODUODENOSCOPY (EGD) WITH PROPOFOL;  Surgeon: Meridee Score Netty Starring., MD;  Location[facility];  Service:  Gastroenterology;  Laterality: N/A;   ESOPHAGOGASTRODUODENOSCOPY (EGD) WITH PROPOFOL N/A 01/19/2019   Procedure: ESOPHAGOGASTRODUODENOSCOPY (EGD) WITH PROPOFOL;  Surgeon: Meridee Score Netty Starring., MD;  Location: [facility] ENDOSCOPY;  Service: Gastroenterology;  Laterality: N/A;   ESOPHAGOGASTRODUODENOSCOPY (EGD) WITH PROPOFOL N/A 12/28/2021   Procedure: ESOPHAGOGASTRODUODENOSCOPY (EGD) WITH PROPOFOL;  Surgeon: Meridee Score Netty Starring., MD;  Location: [facility];  Service: Gastroenterology;  Laterality: N/A;   EUS N/A 12/28/2021   Procedure: UPPER ENDOSCOPIC ULTRASOUND (EUS) LINEAR;  Surgeon: Lemar Lofty., MD;  Location: [facility];  Service: Gastroenterology;  Laterality: N/A;   INCISION AND DRAINAGE ABSCESS N/A 01/25/2022   Procedure: INCISION AND DRAINAGE BACK ABSCESS;  Surgeon: Sheliah Hatch, De Blanch, MD;  Location: [facility];  Service: General;  Laterality: N/A;  90 L DOW   IR PARACENTESIS  08/08/2020   IR PARACENTESIS  03/13/2021   IR PARACENTESIS  02/23/2022   IR RADIOLOGIST EVAL & MGMT  04/24/2022   IR US GUIDE BX ASP/DRAIN  01/23/2022   LEEP  1990's   ORIF FOOT FRACTURE  09/2008   L 5th metatarsal   PANCREATIC STENT PLACEMENT  01/27/2022   Procedure: PANCREATIC STENT PLACEMENT;  Surgeon: Lemar Lofty., MD;  Location: [facility];  Service: Gastroenterology;;   REFRACTIVE SURGERY  2000   REMOVAL OF STONES  01/27/2022   Procedure: REMOVAL OF STONES;  Surgeon: Lemar Lofty., MD;  Location: [facility];  Service: Gastroenterology;;   [name]  01/27/2022   Procedure: [name];  Surgeon: Lemar Lofty., MD;  Location: [facility];  Service: Gastroenterology;;   TIBIA IM NAIL INSERTION Left 03/12/2021   Procedure:  INTRAMEDULLARY (IM) NAIL TIBIAL;  Surgeon: Kerrin Champagne, MD;  Location: [facility] OR;  Service: Orthopedics;  Laterality: Left;   TONSILLECTOMY     UPPER ESOPHAGEAL ENDOSCOPIC ULTRASOUND (EUS) N/A 08/13/2018   Procedure: UPPER ESOPHAGEAL ENDOSCOPIC  ULTRASOUND (EUS);  Surgeon: Lemar Lofty., MD;  Location: [facility] ENDOSCOPY;  Service: Gastroenterology;  Laterality: N/A;   UPPER ESOPHAGEAL ENDOSCOPIC ULTRASOUND (EUS) N/A 01/19/2019   Procedure: UPPER ESOPHAGEAL ENDOSCOPIC ULTRASOUND (EUS);  Surgeon: Lemar Lofty., MD;  Location: [facility] ENDOSCOPY;  Service: Gastroenterology;  Laterality: N/A;    Allergies  Allergen Reactions   Ceftriaxone Hives    Immediate bright red itchy rash on bilateral legs after starting infusion.     Gluten Meal Other (See Comments)    Stomach distress   Oxycodone Itching   Zocor [Simvastatin] Other (See Comments)    Elevated lfts?     Objective/Physical Exam General: The patient is alert and oriented x3 in no acute distress.  Dermatology:  Wound #1 noted to the plantar aspect of the left great toe measuring approximately 1.0 x 1.0 x 0.2 cm (LxWxD).   To the noted ulceration(s), there is no eschar. There is a moderate amount of slough, fibrin, and necrotic tissue noted. Granulation tissue and wound base is red. There is a minimal amount of serosanguineous drainage noted. There is no exposed bone muscle-tendon ligament or joint. There is no malodor. Periwound integrity is intact. Skin is warm, dry and supple bilateral lower extremities.  Vascular: Palpable pedal pulses bilaterally.  There are some mild erythema and edema around the left great toe.  No crepitus.  Neurological: Light touch and protective threshold diminished bilaterally.   Musculoskeletal Exam: Range of motion within normal limits to all pedal and ankle joints bilateral. Muscle strength 5/5 in all groups bilateral.  No prior amputations  Assessment: 1.  Ulcer left great toe secondary to diabetes mellitus 2. diabetes mellitus w/ peripheral neuropathy 3.  Cellulitis left great toe   Plan of Care:  -Patient was evaluated.  X-rays were reviewed that were taken on 11/12/2022. -Medically necessary excisional debridement including  subcutaneous tissue was performed using a tissue nipper and a chisel blade. Excisional debridement of all the necrotic nonviable tissue down to healthy bleeding viable tissue was performed with post-debridement measurements same as pre-. -The wound was cleansed and dry sterile dressing applied. -Prescription for gentamicin ointment apply daily with a Band-Aid -Prescription for doxycycline 100 mg 2 times daily #20 -Cultures were taken and sent to pathology for culture and sensitivity -Continue surgical shoe WBAT -Patient has an appointment next week to pick up her custom molded orthotics to offload the great toe.  Recommend staying in the surgical shoe until she gets the orthotics -Cultures taken and sent to pathology for culture and sensitivity -Return to clinic next scheduled appointment   Felecia Shelling, DPM [facility]  Dr. Felecia Shelling, DPM    [address] [place], [place] [address]                Office [phone]  Fax [phone]

## 2022-11-30 NOTE — Telephone Encounter (Signed)
error 

## 2022-12-05 ENCOUNTER — Ambulatory Visit: Payer: 59

## 2022-12-05 DIAGNOSIS — E0843 Diabetes mellitus due to underlying condition with diabetic autonomic (poly)neuropathy: Secondary | ICD-10-CM

## 2022-12-05 DIAGNOSIS — L84 Corns and callosities: Secondary | ICD-10-CM

## 2022-12-05 DIAGNOSIS — L97522 Non-pressure chronic ulcer of other part of left foot with fat layer exposed: Secondary | ICD-10-CM

## 2022-12-05 NOTE — Progress Notes (Signed)
Patient presents today to pick up custom molded foot orthotics, diagnosed with DM Neuropathy callous and ulcer of Great toe by Dr. Charlsie Merles.   Orthotics were dispensed and fit was satisfactory. Reviewed instructions for break-in and wear. Written instructions given to patient.  Patient will follow up as needed.   Mallory Stewart CPed, CFo,CFm

## 2022-12-17 ENCOUNTER — Telehealth: Payer: Self-pay

## 2022-12-17 NOTE — Telephone Encounter (Signed)
Patient states that her blood sugar has been in the 60-70 range for the last week fasting. Would like to know if she should decrease the Basaglar lower than 26 units.

## 2022-12-17 NOTE — Telephone Encounter (Signed)
Patient advised and will make adjustment

## 2023-01-03 ENCOUNTER — Telehealth: Payer: Self-pay

## 2023-01-03 NOTE — Telephone Encounter (Signed)
Contact patient to schedule appointment  

## 2023-01-08 NOTE — Telephone Encounter (Signed)
Patient is scheduled for 05/27/2023 at 8:10 AM and is added to waitlist for cancellation.

## 2023-01-14 ENCOUNTER — Other Ambulatory Visit: Payer: Self-pay | Admitting: Internal Medicine

## 2023-01-22 ENCOUNTER — Ambulatory Visit: Payer: 59 | Admitting: Internal Medicine

## 2023-02-06 ENCOUNTER — Ambulatory Visit (INDEPENDENT_AMBULATORY_CARE_PROVIDER_SITE_OTHER): Payer: 59 | Admitting: Podiatry

## 2023-02-06 DIAGNOSIS — L97522 Non-pressure chronic ulcer of other part of left foot with fat layer exposed: Secondary | ICD-10-CM

## 2023-02-06 DIAGNOSIS — E0843 Diabetes mellitus due to underlying condition with diabetic autonomic (poly)neuropathy: Secondary | ICD-10-CM | POA: Diagnosis not present

## 2023-02-06 NOTE — Progress Notes (Signed)
Subjective:  Patient ID: Mallory Stewart, female    DOB: 17-Aug-1960,  MRN: [id number]  Chief Complaint  Patient presents with   Wound Check    62 y.o. female presents with the above complaint.  Patient presents with left hallux blister formation a that happened again.  She has been going on and off with this toe blister.  o.  S she is she is doing better her A1c is now 6.9%.  She is getting evaluated next week.  Review of Systems: Negative except as noted in the HPI. Denies N/V/F/Ch.  Past Medical History:  Diagnosis Date   Alcohol-induced chronic pancreatitis (HCC)    Alcoholic cirrhosis (HCC)    B12 deficiency    Back abscess    C. difficile colitis    Concussion    DDD (degenerative disc disease), cervical    Diabetes (HCC)    DKA, type 2 (HCC)    Gastric outlet obstruction 08/12/2018   Hypertension    Neuropathy    Pancreatic pseudocyst/cyst 05/06/2013   Seasonal allergies     Current Outpatient Medications:    Blood Glucose Monitoring Suppl (ONE TOUCH ULTRA 2) w/Device KIT, Use to check blood sugars two times day, Disp: 1 each, Rfl: 0   CALCIUM PO, Take 1 tablet by mouth in the morning and at bedtime., Disp: , Rfl:    cetirizine (ZYRTEC) 10 MG tablet, Take 10 mg by mouth at bedtime., Disp: , Rfl:    Cholecalciferol (VITAMIN D3 PO), Take 2 capsules by mouth in the morning and at bedtime., Disp: , Rfl:    Cyanocobalamin (VITAMIN B-12) 5000 MCG SUBL, Place 5,000 mcg under the tongue daily., Disp: , Rfl:    doxycycline (VIBRA-TABS) 100 MG tablet, Take 1 tablet (100 mg total) by mouth 2 (two) times daily., Disp: 20 tablet, Rfl: 0   furosemide (LASIX) 20 MG tablet, TAKE 1 TABLET(20 MG) BY MOUTH DAILY, Disp: 90 tablet, Rfl: 1   gentamicin ointment (GARAMYCIN) 0.1 %, Apply 1 Application topically 2 (two) times daily., Disp: 30 g, Rfl: 1   glucose blood (ONETOUCH ULTRA) test strip, Check blood sugar 3 times daily, Disp: 400 strip, Rfl: 5   Insulin Glargine (BASAGLAR KWIKPEN)  100 UNIT/ML, Inject 30 Units into the skin daily. (Patient taking differently: Inject into the skin daily. Sliding scale), Disp: 30 mL, Rfl: 3   insulin lispro (HUMALOG KWIKPEN) 100 UNIT/ML KwikPen, Max daily 30 units (Patient taking differently: 30 Units. Max daily 30 units), Disp: 30 mL, Rfl: 3   Insulin Pen Needle 32G X 4 MM MISC, 1 Device by Does not apply route in the morning, at noon, in the evening, and at bedtime., Disp: 400 each, Rfl: 3   Lancets (ONETOUCH ULTRASOFT) lancets, Use to check blood sugar two times a day., Disp: 200 each, Rfl: 3   spironolactone (ALDACTONE) 50 MG tablet, TAKE 1 TABLET(50 MG) BY MOUTH DAILY, Disp: 90 tablet, Rfl: 0  Social History   Tobacco Use  Smoking Status Never  Smokeless Tobacco Never    Allergies  Allergen Reactions   Ceftriaxone Hives    Immediate bright red itchy rash on bilateral legs after starting infusion.     Gluten Meal Other (See Comments)    Stomach distress   Oxycodone Itching   Zocor [Simvastatin] Other (See Comments)    Elevated lfts?   Objective:  There were no vitals filed for this visit. There is no height or weight on file to calculate BMI. Constitutional Well developed. Well nourished.  Vascular Dorsalis pedis pulses palpable bilaterally. Posterior tibial pulses palpable bilaterally. Capillary refill normal to all digits.  No cyanosis or clubbing noted. Pedal hair growth normal.  Neurologic Normal speech. Oriented to person, place, and time. Epicritic sensation to light touch grossly present bilaterally.  Dermatologic Hyperkeratotic lesion circumferential on the hallux with blister formation noted.  No erythema erythema mild malodor present no purulent drainage noted.  Orthopedic: Normal joint ROM without pain or crepitus bilaterally. No visible deformities. No bony tenderness.   Radiographs: None Assessment:   No diagnosis found.  Plan:  Patient was evaluated and treated and all questions answered.  Left  hallux superficial ulceration/epidermal lysis secondary to friction blister/diabetes -All questions and concerns were discussed with the patient in extensive detail.  Given -Given that this has continued to cause her bothersome has failed orthotics she will benefit from exostectomy in the future.  I will wait for the ulcer to completely reepithelialized -I encouraged glucose control -She will do Betadine wet-to-dry dressing changes daily to help with healing -Another surgical shoe was dispensed.  No follow-ups on file.

## 2023-02-08 ENCOUNTER — Ambulatory Visit: Payer: 59 | Admitting: Podiatry

## 2023-02-13 ENCOUNTER — Ambulatory Visit (INDEPENDENT_AMBULATORY_CARE_PROVIDER_SITE_OTHER): Payer: 59 | Admitting: Internal Medicine

## 2023-02-13 ENCOUNTER — Encounter: Payer: Self-pay | Admitting: Internal Medicine

## 2023-02-13 ENCOUNTER — Ambulatory Visit: Payer: 59 | Admitting: Family Medicine

## 2023-02-13 VITALS — BP 138/88 | HR 92 | Ht 68.0 in | Wt 152.0 lb

## 2023-02-13 DIAGNOSIS — N3000 Acute cystitis without hematuria: Secondary | ICD-10-CM | POA: Diagnosis not present

## 2023-02-13 LAB — POC URINALSYSI DIPSTICK (AUTOMATED)
Bilirubin, UA: NEGATIVE
Glucose, UA: NEGATIVE
Ketones, UA: NEGATIVE
Nitrite, UA: NEGATIVE
Protein, UA: POSITIVE — AB
Spec Grav, UA: 1.015 (ref 1.010–1.025)
Urobilinogen, UA: 0.2 U/dL
pH, UA: 6 (ref 5.0–8.0)

## 2023-02-13 MED ORDER — SULFAMETHOXAZOLE-TRIMETHOPRIM 800-160 MG PO TABS: 1.0000 | ORAL_TABLET | Freq: Two times a day (BID) | ORAL | 0 refills | Status: DC

## 2023-02-13 NOTE — Addendum Note (Signed)
Addended by: Eual Fines on: 02/13/2023 08:10 AM   Modules accepted: Orders

## 2023-02-13 NOTE — Assessment & Plan Note (Addendum)
Seems uncomplicated 3+ leuks, trace blood on urinalysis Will send culture Empiric Rx with septra DS bid x 3 days if symptoms go right away--otherwise 7 days

## 2023-02-13 NOTE — Progress Notes (Signed)
Subjective:    Patient ID: Mallory Stewart, female    DOB: Nov 26, 1960, 62 y.o.   MRN: [id number]  HPI Here due to urinary symptoms  Started ~1.5 weeks ago Discomfort during voiding Slight burning sensation---cloudy urine No blood No abdominal pain but feels bloating (after eating gluten --is sensitive) No fever, chills No urgency or increased frequency  Tried OTC yeast med---and cranberry No vaginal discharge  Current Outpatient Medications on File Prior to Visit  Medication Sig Dispense Refill   Blood Glucose Monitoring Suppl (ONE TOUCH ULTRA 2) w/Device KIT Use to check blood sugars two times day 1 each 0   CALCIUM PO Take 1 tablet by mouth in the morning and at bedtime.     cetirizine (ZYRTEC) 10 MG tablet Take 10 mg by mouth at bedtime.     Cholecalciferol (VITAMIN D3 PO) Take 2 capsules by mouth in the morning and at bedtime.     Cyanocobalamin (VITAMIN B-12) 5000 MCG SUBL Place 5,000 mcg under the tongue daily.     furosemide (LASIX) 20 MG tablet TAKE 1 TABLET(20 MG) BY MOUTH DAILY 90 tablet 1   glucose blood (ONETOUCH ULTRA) test strip Check blood sugar 3 times daily 400 strip 5   Insulin Glargine (BASAGLAR KWIKPEN) 100 UNIT/ML Inject 30 Units into the skin daily. (Patient taking differently: Inject into the skin daily. Sliding scale) 30 mL 3   insulin lispro (HUMALOG KWIKPEN) 100 UNIT/ML KwikPen Max daily 30 units (Patient taking differently: 30 Units. Max daily 30 units) 30 mL 3   Insulin Pen Needle 32G X 4 MM MISC 1 Device by Does not apply route in the morning, at noon, in the evening, and at bedtime. 400 each 3   Lancets (ONETOUCH ULTRASOFT) lancets Use to check blood sugar two times a day. 200 each 3   spironolactone (ALDACTONE) 50 MG tablet TAKE 1 TABLET(50 MG) BY MOUTH DAILY 90 tablet 0   No current facility-administered medications on file prior to visit.    Allergies  Allergen Reactions   Ceftriaxone Hives    Immediate bright red itchy rash on bilateral  legs after starting infusion.     Gluten Meal Other (See Comments)    Stomach distress   Oxycodone Itching   Zocor [Simvastatin] Other (See Comments)    Elevated lfts?    Past Medical History:  Diagnosis Date   Alcohol-induced chronic pancreatitis (HCC)    Alcoholic cirrhosis (HCC)    B12 deficiency    Back abscess    C. difficile colitis    Concussion    DDD (degenerative disc disease), cervical    Diabetes (HCC)    DKA, type 2 (HCC)    Gastric outlet obstruction 08/12/2018   Hypertension    Neuropathy    Pancreatic pseudocyst/cyst 05/06/2013   Seasonal allergies     Past Surgical History:  Procedure Laterality Date   ANKLE ARTHROSCOPY Right 12/20/2020   Procedure: RIGHT ANKLE ARTHROSCOPIC DEBRIDEMENT;  Surgeon: Nadara Mustard, MD;  Location: [facility];  Service: Orthopedics;  Laterality: Right;   BILIARY BRUSHING  01/27/2022   Procedure: BILIARY BRUSHING;  Surgeon: Meridee Score Netty Starring., MD;  Location: [facility];  Service: Gastroenterology;;   BIOPSY  01/19/2019   Procedure: BIOPSY;  Surgeon: Lemar Lofty., MD;  Location: [facility] ENDOSCOPY;  Service: Gastroenterology;;   CHOLECYSTECTOMY N/A 07/28/2016   Procedure: LAPAROSCOPIC CHOLECYSTECTOMY WITH INTRAOPERATIVE CHOLANGIOGRAM;  Surgeon: Rodman Pickle, MD;  Location: [facility] OR;  Service: General;  Laterality: N/A;  ERCP N/A 01/27/2022   Procedure: ENDOSCOPIC RETROGRADE CHOLANGIOPANCREATOGRAPHY (ERCP);  Surgeon: Lemar Lofty., MD;  Location: [facility];  Service: Gastroenterology;  Laterality: N/A;   ESOPHAGOGASTRODUODENOSCOPY (EGD) WITH PROPOFOL N/A 08/13/2018   Procedure: ESOPHAGOGASTRODUODENOSCOPY (EGD) WITH PROPOFOL;  Surgeon: Meridee Score Netty Starring., MD;  Location: [facility] ENDOSCOPY;  Service: Gastroenterology;  Laterality: N/A;   ESOPHAGOGASTRODUODENOSCOPY (EGD) WITH PROPOFOL N/A 01/19/2019   Procedure: ESOPHAGOGASTRODUODENOSCOPY (EGD) WITH PROPOFOL;  Surgeon: Meridee Score Netty Starring., MD;   Location: [facility] ENDOSCOPY;  Service: Gastroenterology;  Laterality: N/A;   ESOPHAGOGASTRODUODENOSCOPY (EGD) WITH PROPOFOL N/A 12/28/2021   Procedure: ESOPHAGOGASTRODUODENOSCOPY (EGD) WITH PROPOFOL;  Surgeon: Meridee Score Netty Starring., MD;  Location: [facility];  Service: Gastroenterology;  Laterality: N/A;   EUS N/A 12/28/2021   Procedure: UPPER ENDOSCOPIC ULTRASOUND (EUS) LINEAR;  Surgeon: Lemar Lofty., MD;  Location: [facility];  Service: Gastroenterology;  Laterality: N/A;   INCISION AND DRAINAGE ABSCESS N/A 01/25/2022   Procedure: INCISION AND DRAINAGE BACK ABSCESS;  Surgeon: Sheliah Hatch, De Blanch, MD;  Location: [facility];  Service: General;  Laterality: N/A;  90 L DOW   IR PARACENTESIS  08/08/2020   IR PARACENTESIS  03/13/2021   IR PARACENTESIS  02/23/2022   IR RADIOLOGIST EVAL & MGMT  04/24/2022   IR US GUIDE BX ASP/DRAIN  01/23/2022   LEEP  1990's   ORIF FOOT FRACTURE  09/2008   L 5th metatarsal   PANCREATIC STENT PLACEMENT  01/27/2022   Procedure: PANCREATIC STENT PLACEMENT;  Surgeon: Lemar Lofty., MD;  Location: [facility];  Service: Gastroenterology;;   REFRACTIVE SURGERY  2000   REMOVAL OF STONES  01/27/2022   Procedure: REMOVAL OF STONES;  Surgeon: Lemar Lofty., MD;  Location: [facility];  Service: Gastroenterology;;   [name]  01/27/2022   Procedure: [name];  Surgeon: Lemar Lofty., MD;  Location: [facility];  Service: Gastroenterology;;   TIBIA IM NAIL INSERTION Left 03/12/2021   Procedure: INTRAMEDULLARY (IM) NAIL TIBIAL;  Surgeon: Kerrin Champagne, MD;  Location: [facility] OR;  Service: Orthopedics;  Laterality: Left;   TONSILLECTOMY     UPPER ESOPHAGEAL ENDOSCOPIC ULTRASOUND (EUS) N/A 08/13/2018   Procedure: UPPER ESOPHAGEAL ENDOSCOPIC ULTRASOUND (EUS);  Surgeon: Lemar Lofty., MD;  Location: [facility] ENDOSCOPY;  Service: Gastroenterology;  Laterality: N/A;   UPPER ESOPHAGEAL ENDOSCOPIC ULTRASOUND (EUS) N/A 01/19/2019   Procedure:  UPPER ESOPHAGEAL ENDOSCOPIC ULTRASOUND (EUS);  Surgeon: Lemar Lofty., MD;  Location: [facility] ENDOSCOPY;  Service: Gastroenterology;  Laterality: N/A;    Family History  Problem Relation Age of Onset   Other Mother        tachycardia.Marland KitchenMarland Kitchen?afib   Stroke Father        after hernia suegery   Prostate cancer Father    Other Father        global transient amnesia, unclear source   Atrial fibrillation Sister    Brain cancer Maternal Grandfather        ?   Coronary artery disease Paternal Grandmother    Heart attack Paternal Grandmother 73   Cancer Paternal Grandfather        ?   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  Tobacco Use  Smoking status: Never   Smokeless tobacco: Never  Vaping Use   Vaping status: Never Used  Substance and Sexual Activity   Alcohol use: Not Currently    Alcohol/week: 7.0 standard drinks of alcohol    Types: 7 Glasses of wine per week    Comment: wine   Drug use: No   Sexual activity: Not on file  Other Topics Concern   Not on file  Social History Narrative   Patient is married one stepson    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 [employer] ([employer])works from home mostly when she is not traveling   regular exercise , walking 4-5 times a week   Diet fruits and veggies, water   Alcohol at least several glasses of wine a week Stopped 08/2018   Never smoker, no drug use   Right handed   One story home   [place] in the am   Social Determinants of Health   Financial Resource Strain: Low Risk  (10/15/2022)   Overall Financial Resource Strain (CARDIA)    Difficulty of Paying Living Expenses: Not hard at all  Food Insecurity: No Food Insecurity  (10/15/2022)   Hunger Vital Sign    Worried About Running Out of Food in the Last Year: Never true    Ran Out of Food in the Last Year: Never true  Transportation Needs: No Transportation Needs (10/15/2022)   PRAPARE - Administrator, Civil Service (Medical): No    Lack of Transportation (Non-Medical): No  Physical Activity: Unknown (10/15/2022)   Exercise Vital Sign    Days of Exercise per Week: 4 days    Minutes of Exercise per Session: Patient declined  Stress: Stress Concern Present (10/15/2022)   [employer] of Occupational Health - Occupational Stress Questionnaire    Feeling of Stress : To some extent  Social Connections: Unknown (10/15/2022)   Social Connection and Isolation Panel [NHANES]    Frequency of Communication with Friends and Family: More than three times a week    Frequency of Social Gatherings with Friends and Family: More than three times a week    Attends Religious Services: Never    Database administrator or Organizations: Not on file    Attends Banker Meetings: Not on file    Marital Status: Married  Intimate Partner Violence: Not At Risk (04/07/2022)   Humiliation, Afraid, Rape, and Kick questionnaire    Fear of Current or Ex-Partner: No    Emotionally Abused: No    Physically Abused: No    Sexually Abused: No   Review of Systems No N/V Eating okay     Objective:   Physical Exam Constitutional:      Appearance: Normal appearance.  Abdominal:     Palpations: Abdomen is soft.     Tenderness: There is no abdominal tenderness. There is no right CVA tenderness, left CVA tenderness or guarding.  Neurological:     Mental Status: She is alert.            Assessment & Plan:

## 2023-02-15 LAB — URINE CULTURE
MICRO NUMBER:: 15572817
SPECIMEN QUALITY:: ADEQUATE

## 2023-02-27 ENCOUNTER — Other Ambulatory Visit: Payer: Self-pay | Admitting: Internal Medicine

## 2023-02-27 NOTE — Telephone Encounter (Signed)
Please advise Sir, thank you. 

## 2023-02-27 NOTE — Telephone Encounter (Signed)
Patient not seen here since January.  Does she still require this medication?  If she still being followed at [facility]?

## 2023-02-27 NOTE — Telephone Encounter (Signed)
I left her a voice mail message to call me back so I can get answers to Dr Marvell Fuller questions.

## 2023-02-28 ENCOUNTER — Telehealth: Payer: Self-pay | Admitting: Internal Medicine

## 2023-02-28 ENCOUNTER — Other Ambulatory Visit: Payer: Self-pay | Admitting: Internal Medicine

## 2023-02-28 DIAGNOSIS — K7031 Alcoholic cirrhosis of liver with ascites: Secondary | ICD-10-CM

## 2023-02-28 NOTE — Telephone Encounter (Signed)
I refilled it and ordered labs for her to do so let her know

## 2023-02-28 NOTE — Telephone Encounter (Signed)
PT returning call. Please advise.

## 2023-02-28 NOTE — Telephone Encounter (Signed)
Left her another message to call me back.

## 2023-02-28 NOTE — Telephone Encounter (Signed)
Inbound call from patient,   States she forgot together labs done and will get them done next week   And yes she is keeping the appointments with Dr. Herma Carson  Next one is scheduled for January.

## 2023-02-28 NOTE — Telephone Encounter (Signed)
I spoke with Mallory Stewart and she is still seeing Dr Hamilton Capri. Next appointment is in January.  She feels "awesome"!  She is still on her generic lasix. Please advise on the refill Sir. She thought she was supposed to get labs but I see no orders from [place] Sir.

## 2023-03-01 NOTE — Telephone Encounter (Signed)
Dr Leone Payor has ordered the labs to be done.

## 2023-03-05 ENCOUNTER — Other Ambulatory Visit: Payer: Self-pay | Admitting: Internal Medicine

## 2023-03-05 NOTE — Telephone Encounter (Signed)
Please advise Sir, thank you. 

## 2023-03-06 ENCOUNTER — Ambulatory Visit (INDEPENDENT_AMBULATORY_CARE_PROVIDER_SITE_OTHER): Payer: 59

## 2023-03-06 ENCOUNTER — Ambulatory Visit (INDEPENDENT_AMBULATORY_CARE_PROVIDER_SITE_OTHER): Payer: 59 | Admitting: Podiatry

## 2023-03-06 ENCOUNTER — Encounter: Payer: Self-pay | Admitting: Podiatry

## 2023-03-06 DIAGNOSIS — L97522 Non-pressure chronic ulcer of other part of left foot with fat layer exposed: Secondary | ICD-10-CM | POA: Diagnosis not present

## 2023-03-06 DIAGNOSIS — M898X9 Other specified disorders of bone, unspecified site: Secondary | ICD-10-CM

## 2023-03-06 DIAGNOSIS — M778 Other enthesopathies, not elsewhere classified: Secondary | ICD-10-CM | POA: Diagnosis not present

## 2023-03-06 DIAGNOSIS — Z01818 Encounter for other preprocedural examination: Secondary | ICD-10-CM | POA: Diagnosis not present

## 2023-03-06 NOTE — Progress Notes (Signed)
Subjective:  Patient ID: Mallory Stewart, female    DOB: [birth year],  MRN: [id number]  Chief Complaint  Patient presents with   Foot Pain    Left foot pain follow up patient has been wearing shoe and has minimal relief.    62 y.o. female presents with the above complaint.  Patient presents with left hallux blister formation a that happened again.  She states she is doing great.  Her A1c is controlled.  The ulcer has healed.  She would like to discuss surgical options at this continues to keep happening she is worried she may need a total knee effusion  Review of Systems: Negative except as noted in the HPI. Denies N/V/F/Ch.  Past Medical History:  Diagnosis Date   Alcohol-induced chronic pancreatitis (HCC)    Alcoholic cirrhosis (HCC)    B12 deficiency    Back abscess    C. difficile colitis    Concussion    DDD (degenerative disc disease), cervical    Diabetes (HCC)    DKA, type 2 (HCC)    Gastric outlet obstruction 08/12/2018   Hypertension    Neuropathy    Pancreatic pseudocyst/cyst 05/06/2013   Seasonal allergies     Current Outpatient Medications:    Blood Glucose Monitoring Suppl (ONE TOUCH ULTRA 2) w/Device KIT, Use to check blood sugars two times day, Disp: 1 each, Rfl: 0   CALCIUM PO, Take 1 tablet by mouth in the morning and at bedtime., Disp: , Rfl:    cetirizine (ZYRTEC) 10 MG tablet, Take 10 mg by mouth at bedtime., Disp: , Rfl:    Cholecalciferol (VITAMIN D3 PO), Take 2 capsules by mouth in the morning and at bedtime., Disp: , Rfl:    Cyanocobalamin (VITAMIN B-12) 5000 MCG SUBL, Place 5,000 mcg under the tongue daily., Disp: , Rfl:    furosemide (LASIX) 20 MG tablet, TAKE 1 TABLET(20 MG) BY MOUTH DAILY, Disp: 90 tablet, Rfl: 1   glucose blood (ONETOUCH ULTRA) test strip, Check blood sugar 3 times daily, Disp: 400 strip, Rfl: 5   Insulin Glargine (BASAGLAR KWIKPEN) 100 UNIT/ML, Inject 30 Units into the skin daily. (Patient taking differently: Inject into the  skin daily. Sliding scale), Disp: 30 mL, Rfl: 3   insulin lispro (HUMALOG KWIKPEN) 100 UNIT/ML KwikPen, Max daily 30 units (Patient taking differently: 30 Units. Max daily 30 units), Disp: 30 mL, Rfl: 3   Insulin Pen Needle 32G X 4 MM MISC, 1 Device by Does not apply route in the morning, at noon, in the evening, and at bedtime., Disp: 400 each, Rfl: 3   Lancets (ONETOUCH ULTRASOFT) lancets, Use to check blood sugar two times a day., Disp: 200 each, Rfl: 3   spironolactone (ALDACTONE) 50 MG tablet, TAKE 1 TABLET(50 MG) BY MOUTH DAILY, Disp: 90 tablet, Rfl: 0   sulfamethoxazole-trimethoprim (BACTRIM DS) 800-160 MG tablet, Take 1 tablet by mouth 2 (two) times daily., Disp: 14 tablet, Rfl: 0  Social History   Tobacco Use  Smoking Status Never  Smokeless Tobacco Never    Allergies  Allergen Reactions   Ceftriaxone Hives    Immediate bright red itchy rash on bilateral legs after starting infusion.     Gluten Meal Other (See Comments)    Stomach distress   Oxycodone Itching   Zocor [Simvastatin] Other (See Comments)    Elevated lfts?   Objective:  There were no vitals filed for this visit. There is no height or weight on file to calculate BMI. Constitutional Well developed.  Well nourished.  Vascular Dorsalis pedis pulses palpable bilaterally. Posterior tibial pulses palpable bilaterally. Capillary refill normal to all digits.  No cyanosis or clubbing noted. Pedal hair growth normal.  Neurologic Normal speech. Oriented to person, place, and time. Epicritic sensation to light touch grossly present bilaterally.  Dermatologic Hyperkeratotic lesion completely healed.  No further signs of ulceration noted.  Exostosis noted at the proximal phalanx base. 3 views discussed much of the left foot: Exostosis noted of the proximal phalanx.  No bony abnormalities identified.  Arthritis noted at the first metatarsophalangeal joint  Orthopedic: Normal joint ROM without pain or crepitus  bilaterally. No visible deformities. No bony tenderness.   Radiographs: None Assessment:   1. Bony exostosis   2. Ulcer of great toe, left, with fat layer exposed (HCC)   3. Encounter for preoperative examination for general surgical procedure     Plan:  Patient was evaluated and treated and all questions answered.  Left hallux superficial ulceration/epidermal lysis secondary to friction blister/diabetes -All questions and concerns were discussed with the patient in extensive detail.  Given -Ulceration has clinically healed at this time however given that this has healed patient will benefit from exostectomy of the proximal phalanx I discussed my preoperative intra or postoperative plan with the patient extensive detail she states understanding like to proceed with surgery. -Informed surgical risk consent was reviewed and read aloud to the patient.  I reviewed the films.  I have discussed my findings with the patient in great detail.  I have discussed all risks including but not limited to infection, stiffness, scarring, limp, disability, deformity, damage to blood vessels and nerves, numbness, poor healing, need for braces, arthritis, chronic pain, amputation, death.  All benefits and realistic expectations discussed in great detail.  I have made no promises as to the outcome.  I have provided realistic expectations.  I have offered the patient a 2nd opinion, which they have declined and assured me they preferred to proceed despite the risks   No follow-ups on file.

## 2023-03-11 ENCOUNTER — Telehealth: Payer: Self-pay | Admitting: Podiatry

## 2023-03-11 NOTE — Telephone Encounter (Signed)
DOS- 03/25/2023  EXOSTECTOMY [id number]  [employer] EFFECTIVE DATE- 05/07/2022  DEDUCTIBLE- $8250.00 WITH REMAINING $2191.69 OOP- 9450.00 WITH REMAINING $0.00 COINSURANCE- 40%  PER THE [employer] WEBSITE PORTAL, PRIOR AUTH IS NOT REQUIRED FOR CPT CODE 45409. GOOD FROM 03/25/2023 - 06/23/2023.  AUTH Decision ID #: [id number]

## 2023-03-25 ENCOUNTER — Other Ambulatory Visit: Payer: Self-pay | Admitting: Podiatry

## 2023-03-25 DIAGNOSIS — M25775 Osteophyte, left foot: Secondary | ICD-10-CM | POA: Diagnosis not present

## 2023-03-25 MED ORDER — HYDROCODONE-ACETAMINOPHEN 5-325 MG PO TABS: 1.0000 | ORAL_TABLET | Freq: Four times a day (QID) | ORAL | 0 refills | Status: DC | PRN

## 2023-03-25 MED ORDER — IBUPROFEN 800 MG PO TABS: 800.0000 mg | ORAL_TABLET | Freq: Four times a day (QID) | ORAL | 1 refills | Status: DC | PRN

## 2023-03-26 ENCOUNTER — Encounter: Payer: Self-pay | Admitting: Podiatry

## 2023-03-26 ENCOUNTER — Telehealth: Payer: Self-pay

## 2023-03-26 ENCOUNTER — Ambulatory Visit (INDEPENDENT_AMBULATORY_CARE_PROVIDER_SITE_OTHER): Payer: 59 | Admitting: Podiatry

## 2023-03-26 VITALS — Ht 68.0 in | Wt 152.0 lb

## 2023-03-26 DIAGNOSIS — Z9889 Other specified postprocedural states: Secondary | ICD-10-CM

## 2023-03-26 NOTE — Progress Notes (Signed)
Bandages changed no complication noted.  No strikethrough noted Surgicel was applied

## 2023-03-26 NOTE — Telephone Encounter (Signed)
Patient called at 8:12 this morning and left a message - she also sent a mychart message - She is bleeding through her bandage after surgery yesterday - tried to call patient -but it went straight to voicemail. Left message advising to please call back

## 2023-03-26 NOTE — Telephone Encounter (Signed)
Spoke to patient - she is still bleeding bright red blood through her bandage - she lives in [place] and can come to the [place] office easily - Can you work her in this morning for dressing change in [place]? She is standing by, ready to drive  - please call / advise

## 2023-04-03 ENCOUNTER — Ambulatory Visit (INDEPENDENT_AMBULATORY_CARE_PROVIDER_SITE_OTHER): Payer: 59

## 2023-04-03 ENCOUNTER — Ambulatory Visit (INDEPENDENT_AMBULATORY_CARE_PROVIDER_SITE_OTHER): Payer: 59 | Admitting: Podiatry

## 2023-04-03 ENCOUNTER — Encounter: Payer: Self-pay | Admitting: Podiatry

## 2023-04-03 DIAGNOSIS — M25775 Osteophyte, left foot: Secondary | ICD-10-CM | POA: Diagnosis not present

## 2023-04-03 DIAGNOSIS — Z9889 Other specified postprocedural states: Secondary | ICD-10-CM

## 2023-04-03 NOTE — Progress Notes (Signed)
Subjective:  Patient ID: Mallory Stewart, female    DOB: 04-Jan-1961,  MRN: [id number]  Chief Complaint  Patient presents with   Routine Post Op    PATIENT STATES HER FOOT HAS BEEN OK , IT HASN'T BEEN PAINFUL UNTIL THIS MORNING , PATIENT TAKES A HALF OF A PILL AT NIGHT BEFORE BED FOR PAIN. OTHER THEN THAT SHE DOESN'T NEED THEM AT ALL PATIENT STATES.    DOS: 03/25/23 Procedure: left hallux exostectomy  62 y.o. female returns for post-op check.  Patient states that she is doing well denies any other acute complaints.  Bandages clean dry and intact no nausea fever chills vomiting  Review of Systems: Negative except as noted in the HPI. Denies N/V/F/Ch.  Past Medical History:  Diagnosis Date   Alcohol-induced chronic pancreatitis (HCC)    Alcoholic cirrhosis (HCC)    B12 deficiency    Back abscess    C. difficile colitis    Concussion    DDD (degenerative disc disease), cervical    Diabetes (HCC)    DKA, type 2 (HCC)    Gastric outlet obstruction 08/12/2018   Hypertension    Neuropathy    Pancreatic pseudocyst/cyst 05/06/2013   Seasonal allergies     Current Outpatient Medications:    Blood Glucose Monitoring Suppl (ONE TOUCH ULTRA 2) w/Device KIT, Use to check blood sugars two times day, Disp: 1 each, Rfl: 0   CALCIUM PO, Take 1 tablet by mouth in the morning and at bedtime., Disp: , Rfl:    cetirizine (ZYRTEC) 10 MG tablet, Take 10 mg by mouth at bedtime., Disp: , Rfl:    Cholecalciferol (VITAMIN D3 PO), Take 2 capsules by mouth in the morning and at bedtime., Disp: , Rfl:    Cyanocobalamin (VITAMIN B-12) 5000 MCG SUBL, Place 5,000 mcg under the tongue daily., Disp: , Rfl:    furosemide (LASIX) 20 MG tablet, TAKE 1 TABLET(20 MG) BY MOUTH DAILY, Disp: 90 tablet, Rfl: 1   glucose blood (ONETOUCH ULTRA) test strip, Check blood sugar 3 times daily, Disp: 400 strip, Rfl: 5   HYDROcodone-acetaminophen (NORCO) 5-325 MG tablet, Take 1 tablet by mouth every 6 (six) hours as needed  for moderate pain (pain score 4-6)., Disp: 30 tablet, Rfl: 0   ibuprofen (ADVIL) 800 MG tablet, Take 1 tablet (800 mg total) by mouth every 6 (six) hours as needed., Disp: 60 tablet, Rfl: 1   Insulin Glargine (BASAGLAR KWIKPEN) 100 UNIT/ML, Inject 30 Units into the skin daily. (Patient taking differently: Inject into the skin daily. Sliding scale), Disp: 30 mL, Rfl: 3   insulin lispro (HUMALOG KWIKPEN) 100 UNIT/ML KwikPen, Max daily 30 units (Patient taking differently: 30 Units. Max daily 30 units), Disp: 30 mL, Rfl: 3   Insulin Pen Needle 32G X 4 MM MISC, 1 Device by Does not apply route in the morning, at noon, in the evening, and at bedtime., Disp: 400 each, Rfl: 3   Lancets (ONETOUCH ULTRASOFT) lancets, Use to check blood sugar two times a day., Disp: 200 each, Rfl: 3   spironolactone (ALDACTONE) 50 MG tablet, TAKE 1 TABLET(50 MG) BY MOUTH DAILY, Disp: 90 tablet, Rfl: 0   sulfamethoxazole-trimethoprim (BACTRIM DS) 800-160 MG tablet, Take 1 tablet by mouth 2 (two) times daily., Disp: 14 tablet, Rfl: 0  Social History   Tobacco Use  Smoking Status Never  Smokeless Tobacco Never    Allergies  Allergen Reactions   Ceftriaxone Hives    Immediate bright red itchy rash on bilateral legs  after starting infusion.     Gluten Meal Other (See Comments)    Stomach distress   Oxycodone Itching   Zocor [Simvastatin] Other (See Comments)    Elevated lfts?   Objective:  There were no vitals filed for this visit. There is no height or weight on file to calculate BMI. Constitutional Well developed. Well nourished.  Vascular Foot warm and well perfused. Capillary refill normal to all digits.   Neurologic Normal speech. Oriented to person, place, and time. Epicritic sensation to light touch grossly present bilaterally.  Dermatologic Skin healing well without signs of infection. Skin edges well coapted without signs of infection.  Orthopedic: Tenderness to palpation noted about the surgical site.    Radiographs: 3 views of skeletally mature left foot: Exostectomy noted.  No other abnormalities identified Assessment:   1. Status post foot surgery    Plan:  Patient was evaluated and treated and all questions answered.  S/p foot surgery left -Progressing as expected post-operatively. -XR: See above -WB Status: Weightbearing as tolerated in surgical shoe -Sutures: Intact.  No clinical signs of dehiscence noted no complication noted. -Medications: None -Foot redressed.  No follow-ups on file.

## 2023-04-17 ENCOUNTER — Ambulatory Visit (INDEPENDENT_AMBULATORY_CARE_PROVIDER_SITE_OTHER): Payer: 59 | Admitting: Podiatry

## 2023-04-17 ENCOUNTER — Encounter: Payer: Self-pay | Admitting: Podiatry

## 2023-04-17 DIAGNOSIS — Z9889 Other specified postprocedural states: Secondary | ICD-10-CM

## 2023-04-17 NOTE — Progress Notes (Signed)
Subjective:  Patient ID: Mallory Stewart, female    DOB: 08-19-1960,  MRN: [id number]  Chief Complaint  Patient presents with   Routine Post Op    2nd post op, She is ready to get out sutures, POV # 2 DOS 03/25/23 --- LEFT HALLUX EXOSTECTOMY    DOS: 03/25/23 Procedure: left hallux exostectomy  62 y.o. female returns for post-op check.  Patient states that she is doing well denies any other acute complaints.  Bandages clean dry and intact no nausea fever chills vomiting  Review of Systems: Negative except as noted in the HPI. Denies N/V/F/Ch.  Past Medical History:  Diagnosis Date   Alcohol-induced chronic pancreatitis (HCC)    Alcoholic cirrhosis (HCC)    B12 deficiency    Back abscess    C. difficile colitis    Concussion    DDD (degenerative disc disease), cervical    Diabetes (HCC)    DKA, type 2 (HCC)    Gastric outlet obstruction 08/12/2018   Hypertension    Neuropathy    Pancreatic pseudocyst/cyst 05/06/2013   Seasonal allergies     Current Outpatient Medications:    Blood Glucose Monitoring Suppl (ONE TOUCH ULTRA 2) w/Device KIT, Use to check blood sugars two times day, Disp: 1 each, Rfl: 0   CALCIUM PO, Take 1 tablet by mouth in the morning and at bedtime., Disp: , Rfl:    cetirizine (ZYRTEC) 10 MG tablet, Take 10 mg by mouth at bedtime., Disp: , Rfl:    Cholecalciferol (VITAMIN D3 PO), Take 2 capsules by mouth in the morning and at bedtime., Disp: , Rfl:    Cyanocobalamin (VITAMIN B-12) 5000 MCG SUBL, Place 5,000 mcg under the tongue daily., Disp: , Rfl:    furosemide (LASIX) 20 MG tablet, TAKE 1 TABLET(20 MG) BY MOUTH DAILY, Disp: 90 tablet, Rfl: 1   glucose blood (ONETOUCH ULTRA) test strip, Check blood sugar 3 times daily, Disp: 400 strip, Rfl: 5   HYDROcodone-acetaminophen (NORCO) 5-325 MG tablet, Take 1 tablet by mouth every 6 (six) hours as needed for moderate pain (pain score 4-6)., Disp: 30 tablet, Rfl: 0   ibuprofen (ADVIL) 800 MG tablet, Take 1 tablet  (800 mg total) by mouth every 6 (six) hours as needed., Disp: 60 tablet, Rfl: 1   Insulin Glargine (BASAGLAR KWIKPEN) 100 UNIT/ML, Inject 30 Units into the skin daily. (Patient taking differently: Inject into the skin daily. Sliding scale), Disp: 30 mL, Rfl: 3   insulin lispro (HUMALOG KWIKPEN) 100 UNIT/ML KwikPen, Max daily 30 units (Patient taking differently: 30 Units. Max daily 30 units), Disp: 30 mL, Rfl: 3   Insulin Pen Needle 32G X 4 MM MISC, 1 Device by Does not apply route in the morning, at noon, in the evening, and at bedtime., Disp: 400 each, Rfl: 3   Lancets (ONETOUCH ULTRASOFT) lancets, Use to check blood sugar two times a day., Disp: 200 each, Rfl: 3   spironolactone (ALDACTONE) 50 MG tablet, TAKE 1 TABLET(50 MG) BY MOUTH DAILY, Disp: 90 tablet, Rfl: 0   sulfamethoxazole-trimethoprim (BACTRIM DS) 800-160 MG tablet, Take 1 tablet by mouth 2 (two) times daily., Disp: 14 tablet, Rfl: 0  Social History   Tobacco Use  Smoking Status Never  Smokeless Tobacco Never    Allergies  Allergen Reactions   Ceftriaxone Hives    Immediate bright red itchy rash on bilateral legs after starting infusion.     Gluten Meal Other (See Comments)    Stomach distress   Oxycodone Itching  Zocor [Simvastatin] Other (See Comments)    Elevated lfts?   Objective:  There were no vitals filed for this visit. There is no height or weight on file to calculate BMI. Constitutional Well developed. Well nourished.  Vascular Foot warm and well perfused. Capillary refill normal to all digits.   Neurologic Normal speech. Oriented to person, place, and time. Epicritic sensation to light touch grossly present bilaterally.  Dermatologic Skin healing well without signs of infection. Skin edges well coapted without signs of infection.  Orthopedic: Tenderness to palpation noted about the surgical site.   Radiographs: 3 views of skeletally mature left foot: Exostectomy noted.  No other abnormalities  identified Assessment:   No diagnosis found.  Plan:  Patient was evaluated and treated and all questions answered.  S/p foot surgery left -Progressing as expected post-operatively. -XR: See above -WB Status: Weightbearing as tolerated in regular shoes -Sutures: removed   No clinical signs of dehiscence noted no complication noted. -Medications: None -Foot redressed.  No follow-ups on file.

## 2023-05-27 ENCOUNTER — Ambulatory Visit: Payer: 59 | Admitting: Internal Medicine

## 2023-05-28 ENCOUNTER — Encounter: Payer: Self-pay | Admitting: Internal Medicine

## 2023-05-28 ENCOUNTER — Other Ambulatory Visit: Payer: Self-pay | Admitting: Internal Medicine

## 2023-05-28 NOTE — Telephone Encounter (Signed)
Please advise Mallory Stewart, last seen here 05/2022. Thank you.

## 2023-05-28 NOTE — Telephone Encounter (Signed)
Mallory Stewart called back and said she has felt so good that she has neglected to get her labs and she will come this week. [facility] She said we have always been the ones to refill the spironolactone. Please advise Sir, thank you. She also informed me today that her mom has a new dx of melanoma and that has her distracted.

## 2023-05-28 NOTE — Telephone Encounter (Signed)
I left her a detailed message to call me back to discuss this rx.

## 2023-05-28 NOTE — Telephone Encounter (Signed)
I left her a detailed message with Dr Marvell Fuller instructions and my number to call back with any questions.

## 2023-05-28 NOTE — Telephone Encounter (Signed)
She should be getting this from [facility] - I had asked for labs in October that she never did and she said she had [facility] f/u in this month  Please find out what is going on and if is she swelling - she really needs [facility] clinic f/u

## 2023-05-28 NOTE — Telephone Encounter (Signed)
I refilled x 30 days  She will need to get further refills through liver clinic or PCP unless she is seeing me but seems like f/u me for this is not needed since going to [facility]

## 2023-05-29 ENCOUNTER — Ambulatory Visit: Payer: 59 | Admitting: Podiatry

## 2023-05-29 NOTE — Progress Notes (Deleted)
Name: Mallory Stewart  Age/ Sex: 63 y.o., female   MRN/ DOB: [id number], [birth year]     PCP: Excell Seltzer, MD   Reason for Endocrinology Evaluation: Type 2 Diabetes Mellitus  Initial Endocrine Consultative Visit: 08/22/2018    PATIENT IDENTIFIER: Mallory Stewart is a 63 y.o. female with a past medical history of HTN, T2DM , chronic pancreatitis and gastric outlet obstruction and alcoholic cirrhosis. The patient has followed with Endocrinology clinic since 08/22/2018 for consultative assistance with management of her diabetes.  DIABETIC HISTORY:  Ms. Trace was with  T2DM in 2010.Pt had hospital admission in 2014 for pancreatitis secondary to ETOH intake, with another one in 2018 and 2020.  Insulin started in 07/2018 . Her hemoglobin A1c has ranged from 5.4% in 2016, peaking at 13.3%  In 2020.  Prandial insulin started 08/2018 Marcelline Deist added in 09/2018 but stopped due to cost 2023   Started MDI regimen July 2023  SUBJECTIVE:   During the last visit (07/12/2022): A1c 7.6 %      Today (05/29/2023): Ms. Askelson is here for a follow up on diabetes management.She has not been to our clinic in 10 months. She checks her blood sugars 4-5 times daily . The patient has hypoglycemic episodes, she is symptoms with these episodes, thry typically occur after breakfast and in the afternoon    She continues to follow-up with gastroenterology for alcoholic liver cirrhosis and chronic pancreatitis   She is S/p left hallux exostectomy 04/2023, continues to follow with podiatry  Has an abdominal MRI next week , pending determination for the need  for stent placement   No abdominal pain in general  Denies nausea, constipation or diarrhea    HOME DIABETES REGIMEN:  Basaglar 26 units daily  Humalog  8 units 3 times daily before every meal Correction factor : NovoLog (BG -130/30)    Statin: No ACE-I/ARB: yes    GLUCOSE LOG:  Fasting this am 132 mg/dL     DIABETIC  COMPLICATIONS: Microvascular complications:  Neuropathic , S/P cataract sx Denies: CKD Last Eye Exam: Completed 2023  Macrovascular complications:  Denies: CAD, CVA, PVD   HISTORY:  Past Medical History:  Past Medical History:  Diagnosis Date   Alcohol-induced chronic pancreatitis (HCC)    Alcoholic cirrhosis (HCC)    B12 deficiency    Back abscess    C. difficile colitis    Concussion    DDD (degenerative disc disease), cervical    Diabetes (HCC)    DKA, type 2 (HCC)    Gastric outlet obstruction 08/12/2018   Hypertension    Neuropathy    Pancreatic pseudocyst/cyst 05/06/2013   Seasonal allergies    Past Surgical History:  Past Surgical History:  Procedure Laterality Date   ANKLE ARTHROSCOPY Right 12/20/2020   Procedure: RIGHT ANKLE ARTHROSCOPIC DEBRIDEMENT;  Surgeon: Nadara Mustard, MD;  Location: [facility];  Service: Orthopedics;  Laterality: Right;   BILIARY BRUSHING  01/27/2022   Procedure: BILIARY BRUSHING;  Surgeon: Meridee Score Netty Starring., MD;  Location: [facility];  Service: Gastroenterology;;   BIOPSY  01/19/2019   Procedure: BIOPSY;  Surgeon: Lemar Lofty., MD;  Location: [facility];  Service: Gastroenterology;;   CHOLECYSTECTOMY N/A 07/28/2016   Procedure: LAPAROSCOPIC CHOLECYSTECTOMY WITH INTRAOPERATIVE CHOLANGIOGRAM;  Surgeon: Rodman Pickle, MD;  Location[facility] OR;  Service: General;  Laterality: N/A;   ERCP N/A 01/27/2022   Procedure: ENDOSCOPIC RETROGRADE CHOLANGIOPANCREATOGRAPHY (ERCP);  Surgeon: Lemar Lofty., MD;  Location:  [facility] ENDOSCOPY;  Service: Gastroenterology;  Laterality: N/A;   ESOPHAGOGASTRODUODENOSCOPY (EGD) WITH PROPOFOL N/A 08/13/2018   Procedure: ESOPHAGOGASTRODUODENOSCOPY (EGD) WITH PROPOFOL;  Surgeon: Meridee Score Netty Starring., MD;  Location: [facility]COPY;  Service: Gastroenterology;  Laterality: N/A;   ESOPHAGOGASTRODUODENOSCOPY (EGD) WITH PROPOFOL N/A 01/19/2019   Procedure: ESOPHAGOGASTRODUODENOSCOPY  (EGD) WITH PROPOFOL;  Surgeon: Meridee Score Netty Starring., MD;  Location: [facility] ENDOSCOPY;  Service: Gastroenterology;  Laterality: N/A;   ESOPHAGOGASTRODUODENOSCOPY (EGD) WITH PROPOFOL N/A 12/28/2021   Procedure: ESOPHAGOGASTRODUODENOSCOPY (EGD) WITH PROPOFOL;  Surgeon: Meridee Score Netty Starring., MD;  Location: [facility] ENDOSCOPY;  Service: Gastroenterology;  Laterality: N/A;   EUS N/A 12/28/2021   Procedure: UPPER ENDOSCOPIC ULTRASOUND (EUS) LINEAR;  Surgeon: Lemar Lofty., MD;  Location: [facility] ENDOSCOPY;  Service: Gastroenterology;  Laterality: N/A;   INCISION AND DRAINAGE ABSCESS N/A 01/25/2022   Procedure: INCISION AND DRAINAGE BACK ABSCESS;  Surgeon: Sheliah Hatch, De Blanch, MD;  Location: [facility] ORS;  Service: General;  Laterality: N/A;  90 L DOW   IR PARACENTESIS  08/08/2020   IR PARACENTESIS  03/13/2021   IR PARACENTESIS  02/23/2022   IR RADIOLOGIST EVAL & MGMT  04/24/2022   IR US GUIDE BX ASP/DRAIN  01/23/2022   LEEP  1990's   ORIF FOOT FRACTURE  09/2008   L 5th metatarsal   PANCREATIC STENT PLACEMENT  01/27/2022   Procedure: PANCREATIC STENT PLACEMENT;  Surgeon: Lemar Lofty., MD;  Location: [facility];  Service: Gastroenterology;;   REFRACTIVE SURGERY  2000   REMOVAL OF STONES  01/27/2022   Procedure: REMOVAL OF STONES;  Surgeon: Lemar Lofty., MD;  Location: [facility];  Service: Gastroenterology;;   [name]  01/27/2022   Procedure: [name];  Surgeon: Lemar Lofty., MD;  Location: [facility];  Service: Gastroenterology;;   TIBIA IM NAIL INSERTION Left 03/12/2021   Procedure: INTRAMEDULLARY (IM) NAIL TIBIAL;  Surgeon: Kerrin Champagne, MD;  Location: [facility] OR;  Service: Orthopedics;  Laterality: Left;   TONSILLECTOMY     UPPER ESOPHAGEAL ENDOSCOPIC ULTRASOUND (EUS) N/A 08/13/2018   Procedure: UPPER ESOPHAGEAL ENDOSCOPIC ULTRASOUND (EUS);  Surgeon: Lemar Lofty., MD;  Location[facility]COPY;  Service: Gastroenterology;  Laterality: N/A;   UPPER  ESOPHAGEAL ENDOSCOPIC ULTRASOUND (EUS) N/A 01/19/2019   Procedure: UPPER ESOPHAGEAL ENDOSCOPIC ULTRASOUND (EUS);  Surgeon: Lemar Lofty., MD;  Location: [facility] ENDOSCOPY;  Service: Gastroenterology;  Laterality: N/A;   Social History:  reports that she has never smoked. She has never used smokeless tobacco. She reports that she does not currently use alcohol after a past usage of about 7.0 standard drinks of alcohol per week. She reports that she does not use drugs. Family History:  Family History  Problem Relation Age of Onset   Other Mother        tachycardia.Marland KitchenMarland Kitchen?afib   Melanoma Mother    Stroke Father        after hernia suegery   Prostate cancer Father    Other Father        global transient amnesia, unclear source   Atrial fibrillation Sister    Healthy Brother    Healthy Brother    Brain cancer Maternal Grandfather        ?   Coronary artery disease Paternal Grandmother    Heart attack Paternal Grandmother 85   Cancer Paternal Grandfather        ?     HOME MEDICATIONS: Allergies as of 05/30/2023       Reactions   Ceftriaxone Hives   Immediate bright red itchy rash  on bilateral legs after starting infusion.     Gluten Meal Other (See Comments)   Stomach distress   Oxycodone Itching   Zocor [simvastatin] Other (See Comments)   Elevated lfts?        Medication List        Accurate as of May 29, 2023 12:47 PM. If you have any questions, ask your nurse or doctor.          Basaglar KwikPen 100 UNIT/ML Inject 30 Units into the skin daily. What changed:  how much to take additional instructions   CALCIUM PO Take 1 tablet by mouth in the morning and at bedtime.   cetirizine 10 MG tablet Commonly known as: ZYRTEC Take 10 mg by mouth at bedtime.   furosemide 20 MG tablet Commonly known as: LASIX TAKE 1 TABLET(20 MG) BY MOUTH DAILY   HYDROcodone-acetaminophen 5-325 MG tablet Commonly known as: Norco Take 1 tablet by mouth every 6 (six) hours as  needed for moderate pain (pain score 4-6).   ibuprofen 800 MG tablet Commonly known as: ADVIL Take 1 tablet (800 mg total) by mouth every 6 (six) hours as needed.   insulin lispro 100 UNIT/ML KwikPen Commonly known as: HumaLOG KwikPen Max daily 30 units What changed: how much to take   Insulin Pen Needle 32G X 4 MM Misc 1 Device by Does not apply route in the morning, at noon, in the evening, and at bedtime.   ONE TOUCH ULTRA 2 w/Device Kit Use to check blood sugars two times day   OneTouch Ultra test strip Generic drug: glucose blood Check blood sugar 3 times daily   onetouch ultrasoft lancets Use to check blood sugar two times a day.   spironolactone 50 MG tablet Commonly known as: ALDACTONE TAKE 1 TABLET(50 MG) BY MOUTH DAILY   sulfamethoxazole-trimethoprim 800-160 MG tablet Commonly known as: BACTRIM DS Take 1 tablet by mouth 2 (two) times daily.   Vitamin B-12 5000 MCG Subl Place 5,000 mcg under the tongue daily.   VITAMIN D3 PO Take 2 capsules by mouth in the morning and at bedtime.           OBJECTIVE:   Vital Signs: There were no vitals taken for this visit.   Exam: General: Pt appears well and is in NAD  Lungs: Clear with good BS bilat   Heart: RRR   Abdomen: soft, nontender  Extremities: No pretibial edema.   Neuro: MS is good with appropriate affect, pt is alert and Ox3   DM Foot Exam 04/17/2023 per podiatry    DATA REVIEWED:  Lab Results  Component Value Date   HGBA1C 7.6 (H) 07/03/2022   HGBA1C 7.4 (A) 04/06/2022   HGBA1C 6.0 (A) 01/17/2022    Latest Reference Range & Units 03/23/22 08:40  Sodium 135 - 145 mEq/L 135  Potassium 3.5 - 5.1 mEq/L 4.6  Chloride 96 - 112 mEq/L 100  CO2 19 - 32 mEq/L 28  Glucose 70 - 99 mg/dL 034 (H)  BUN 6 - 23 mg/dL 30 (H)  Creatinine 7.42 - 1.20 mg/dL 5.95  Calcium 8.4 - 63.8 mg/dL 9.7  GFR >75.64 mL/min 75.12    Latest Reference Range & Units 03/23/22 08:40  Total CHOL/HDL Ratio  3   Cholesterol 0 - 200 mg/dL 332  HDL Cholesterol >95.18 mg/dL 84.16  LDL (calc) 0 - 99 mg/dL 92  NonHDL  606.30  Triglycerides 0.0 - 149.0 mg/dL 160.1  VLDL 0.0 - 09.3 mg/dL 25.2  ASSESSMENT / PLAN / RECOMMENDATIONS:   1) Type 2 Diabetes Mellitus, Sub-Optimally  controlled , With Neuropathic complications - Most recent A1c of 7.6 %. Goal A1c < 7.0 %.    -A1c remains above goal but overall stable - She is NOT interested in CGM technology  - She is intolerant to Metformin - She is not a candidate for GLP-1 agonists, or DPP-4 inhibitors due to hx of pancreatitis.  -[place] [employer] has been cost prohibitive since losing her job earlier in 2023 -She endorsed hypoglycemia after breakfast, will reduce breakfast dose of Humalog  MEDICATIONS: - Continue  Basaglar 30 units daily -Change Humalog  6 units before breakfast, 8 units before lunch, 8 units before supper - Continue Correction factor : NovoLog (BG -130/30)    EDUCATION / INSTRUCTIONS: BG monitoring instructions: Patient is instructed to check her blood sugars 3 times a day before each meal Call [facility] if: BG persistently < 70  I reviewed the Rule of 15 for the treatment of hypoglycemia in detail with the patient. Literature supplied.   2) Diabetic complications:  Eye: Does not have known diabetic retinopathy.  Neuro/ Feet: Does have known diabetic peripheral neuropathy. Renal: Patient does not have known baseline CKD. She is on an ACEI/ARB at present.     3) Peripheral neuropathy:  -She has noted slight improvement in sensation at the mid plantar surface bilaterally but unfortunately she does have a preulcerative callus over the left first metatarsal -Referral to podiatry has been placed   F/U in 3 months   Signed electronically by: Lyndle Herrlich, MD  [facility]  [facility] [address] [place], [place] [address] Phone: [phone] FAX:  [phone]   CC: Excell Seltzer, MD [address] [place] [address] Phone: [phone]  Fax: [phone]  Return to Endocrinology clinic as below: Future Appointments  Date Time Provider Department Center  05/30/2023  8:10 AM Saw Mendenhall, Konrad Dolores, MD [id number] None

## 2023-05-30 ENCOUNTER — Ambulatory Visit: Payer: 59 | Admitting: Internal Medicine

## 2023-06-25 ENCOUNTER — Ambulatory Visit (INDEPENDENT_AMBULATORY_CARE_PROVIDER_SITE_OTHER): Payer: 59

## 2023-06-25 DIAGNOSIS — Z794 Long term (current) use of insulin: Secondary | ICD-10-CM

## 2023-06-25 DIAGNOSIS — E1165 Type 2 diabetes mellitus with hyperglycemia: Secondary | ICD-10-CM

## 2023-06-25 LAB — POCT GLYCOSYLATED HEMOGLOBIN (HGB A1C): Hemoglobin A1C: 7.3 % — AB (ref 4.0–5.6)

## 2023-06-26 ENCOUNTER — Encounter: Payer: Self-pay | Admitting: Internal Medicine

## 2023-06-26 ENCOUNTER — Telehealth (INDEPENDENT_AMBULATORY_CARE_PROVIDER_SITE_OTHER): Payer: 59 | Admitting: Internal Medicine

## 2023-06-26 ENCOUNTER — Telehealth: Payer: Self-pay | Admitting: Internal Medicine

## 2023-06-26 VITALS — Ht 68.0 in

## 2023-06-26 DIAGNOSIS — E1165 Type 2 diabetes mellitus with hyperglycemia: Secondary | ICD-10-CM | POA: Diagnosis not present

## 2023-06-26 DIAGNOSIS — Z794 Long term (current) use of insulin: Secondary | ICD-10-CM | POA: Diagnosis not present

## 2023-06-26 DIAGNOSIS — E1142 Type 2 diabetes mellitus with diabetic polyneuropathy: Secondary | ICD-10-CM | POA: Diagnosis not present

## 2023-06-26 MED ORDER — BASAGLAR KWIKPEN 100 UNIT/ML ~~LOC~~ SOPN: 18.0000 [IU] | PEN_INJECTOR | Freq: Every day | SUBCUTANEOUS | 4 refills | Status: DC

## 2023-06-26 MED ORDER — ACCU-CHEK GUIDE TEST VI STRP: 1.0000 | ORAL_STRIP | Freq: Three times a day (TID) | 3 refills | Status: AC

## 2023-06-26 MED ORDER — INSULIN LISPRO (1 UNIT DIAL) 100 UNIT/ML (KWIKPEN): PEN_INJECTOR | SUBCUTANEOUS | 3 refills | Status: DC

## 2023-06-26 MED ORDER — INSULIN PEN NEEDLE 32G X 4 MM MISC: 1.0000 | Freq: Four times a day (QID) | 3 refills | Status: DC

## 2023-06-26 MED ORDER — ACCU-CHEK GUIDE W/DEVICE KIT
1.0000 | PACK | Freq: Three times a day (TID) | 0 refills | Status: AC
Start: 2023-06-26 — End: ?

## 2023-06-26 NOTE — Telephone Encounter (Signed)
Please schedule the patient to see me in 6 months   Thanks

## 2023-06-26 NOTE — Progress Notes (Addendum)
Virtual Visit via Video Note  I connected with Mallory Stewart on 06/26/23  at 9: 50 AM telemedicine application and verified that I am speaking with the correct person using two identifiers.   I discussed the limitations of evaluation and management by telemedicine and the availability of in person appointments. The patient expressed understanding and agreed to proceed.  -Location of the patient :Home  -Location of the provider : Home -The names of all persons participating in the telemedicine service : Pt and myself       Name: Mallory Stewart  Age/ Sex: 63 y.o., female   MRN/ DOB: [id number], [birth year]     PCP: Excell Seltzer, MD   Reason for Endocrinology Evaluation: Type 2 Diabetes Mellitus  Initial Endocrine Consultative Visit: 08/22/2018    PATIENT IDENTIFIER: Mallory Stewart is a 63 y.o. female with a past medical history of HTN, T2DM , chronic pancreatitis and gastric outlet obstruction and alcoholic cirrhosis. The patient has followed with Endocrinology clinic since 08/22/2018 for consultative assistance with management of her diabetes.  DIABETIC HISTORY:  Mallory Stewart was with  T2DM in 2010.Pt had hospital admission in 2014 for pancreatitis secondary to ETOH intake, with another one in 2018 and 2020.  Insulin started in 07/2018 . Her hemoglobin A1c has ranged from 5.4% in 2016, peaking at 13.3%  In 2020.  Prandial insulin started 08/2018 Marcelline Deist added in 09/2018 but stopped due to cost 2023   Started MDI regimen July 2023  SUBJECTIVE:   During the last visit (07/12/2022): A1c 7.6 %      Today (06/26/2023): Ms. [name] is here for a follow up on diabetes management.She has not been to our clinic in 10 months. She checks her blood sugars 2-3 times daily . The patient has hypoglycemic episodes, she is symptoms with these episodes,  they typically occur in the morning   She denies any nausea or vomiting She denies any abdominal pain She denies any  constipation or diarrhea  The patient does endorse high cost for One Touch ultra test strips, and is wondering if there are any other options   She continues to follow-up with [facility] for alcoholic liver cirrhosis and chronic pancreatitis , she is on the transplant list  She is S/p left hallux exostectomy 04/2023, continues to follow with podiatry    HOME DIABETES REGIMEN:  Basaglar 22 units daily  Humalog  6/8/8  units  Correction factor : NovoLog (BG -130/30)    Statin: No ACE-I/ARB: yes    GLUCOSE LOG:  Fasting this am 132 mg/dL     DIABETIC COMPLICATIONS: Microvascular complications:  Neuropathic , S/P cataract sx Denies: CKD Last Eye Exam: Completed 2023  Macrovascular complications:  Denies: CAD, CVA, PVD   HISTORY:  Past Medical History:  Past Medical History:  Diagnosis Date   Alcohol-induced chronic pancreatitis (HCC)    Alcoholic cirrhosis (HCC)    B12 deficiency    Back abscess    C. difficile colitis    Concussion    DDD (degenerative disc disease), cervical    Diabetes (HCC)    DKA, type 2 (HCC)    Gastric outlet obstruction 08/12/2018   Hypertension    Neuropathy    Pancreatic pseudocyst/cyst 05/06/2013   Seasonal allergies    Past Surgical History:  Past Surgical History:  Procedure Laterality Date   ANKLE ARTHROSCOPY Right 12/20/2020   Procedure: RIGHT ANKLE ARTHROSCOPIC DEBRIDEMENT;  Surgeon: Nadara Mustard, MD;  Location: [facility];  Service: Orthopedics;  Laterality: Right;   BILIARY BRUSHING  01/27/2022   Procedure: BILIARY BRUSHING;  Surgeon: Meridee Score Netty Starring., MD;  Location: [facility];  Service: Gastroenterology;;   BIOPSY  01/19/2019   Procedure: BIOPSY;  Surgeon: Lemar Lofty., MD;  Location: [facility];  Service: Gastroenterology;;   CHOLECYSTECTOMY N/A 07/28/2016   Procedure: LAPAROSCOPIC CHOLECYSTECTOMY WITH INTRAOPERATIVE CHOLANGIOGRAM;  Surgeon: Rodman Pickle, MD;   Location: [facility] OR;  Service: General;  Laterality: N/A;   ERCP N/A 01/27/2022   Procedure: ENDOSCOPIC RETROGRADE CHOLANGIOPANCREATOGRAPHY (ERCP);  Surgeon: Lemar Lofty., MD;  Location: [facility];  Service: Gastroenterology;  Laterality: N/A;   ESOPHAGOGASTRODUODENOSCOPY (EGD) WITH PROPOFOL N/A 08/13/2018   Procedure: ESOPHAGOGASTRODUODENOSCOPY (EGD) WITH PROPOFOL;  Surgeon: Meridee Score Netty Starring., MD;  Location: [facility];  Service: Gastroenterology;  Laterality: N/A;   ESOPHAGOGASTRODUODENOSCOPY (EGD) WITH PROPOFOL N/A 01/19/2019   Procedure: ESOPHAGOGASTRODUODENOSCOPY (EGD) WITH PROPOFOL;  Surgeon: Meridee Score Netty Starring., MD;  Location: [facility]COPY;  Service: Gastroenterology;  Laterality: N/A;   ESOPHAGOGASTRODUODENOSCOPY (EGD) WITH PROPOFOL N/A 12/28/2021   Procedure: ESOPHAGOGASTRODUODENOSCOPY (EGD) WITH PROPOFOL;  Surgeon: Meridee Score Netty Starring., MD;  Location: [facility];  Service: Gastroenterology;  Laterality: N/A;   EUS N/A 12/28/2021   Procedure: UPPER ENDOSCOPIC ULTRASOUND (EUS) LINEAR;  Surgeon: Lemar Lofty., MD;  Location: [facility];  Service: Gastroenterology;  Laterality: N/A;   INCISION AND DRAINAGE ABSCESS N/A 01/25/2022   Procedure: INCISION AND DRAINAGE BACK ABSCESS;  Surgeon: Sheliah Hatch, De Blanch, MD;  Location: [facility]S;  Service: General;  Laterality: N/A;  90 L DOW   IR PARACENTESIS  08/08/2020   IR PARACENTESIS  03/13/2021   IR PARACENTESIS  02/23/2022   IR RADIOLOGIST EVAL & MGMT  04/24/2022   IR US GUIDE BX ASP/DRAIN  01/23/2022   LEEP  1990's   ORIF FOOT FRACTURE  09/2008   L 5th metatarsal   PANCREATIC STENT PLACEMENT  01/27/2022   Procedure: PANCREATIC STENT PLACEMENT;  Surgeon: Lemar Lofty., MD;  Location: [facility];  Service: Gastroenterology;;   REFRACTIVE SURGERY  2000   REMOVAL OF STONES  01/27/2022   Procedure: REMOVAL OF STONES;  Surgeon: Lemar Lofty., MD;  Location: [facility];  Service: Gastroenterology;;    Mallory Stewart  01/27/2022   Procedure: Mallory Stewart;  Surgeon: Lemar Lofty., MD;  Location: [facility];  Service: Gastroenterology;;   TIBIA IM NAIL INSERTION Left 03/12/2021   Procedure: INTRAMEDULLARY (IM) NAIL TIBIAL;  Surgeon: Kerrin Champagne, MD;  Location: [facility] OR;  Service: Orthopedics;  Laterality: Left;   TONSILLECTOMY     UPPER ESOPHAGEAL ENDOSCOPIC ULTRASOUND (EUS) N/A 08/13/2018   Procedure: UPPER ESOPHAGEAL ENDOSCOPIC ULTRASOUND (EUS);  Surgeon: Lemar Lofty., MD;  Location: [facility] ENDOSCOPY;  Service: Gastroenterology;  Laterality: N/A;   UPPER ESOPHAGEAL ENDOSCOPIC ULTRASOUND (EUS) N/A 01/19/2019   Procedure: UPPER ESOPHAGEAL ENDOSCOPIC ULTRASOUND (EUS);  Surgeon: Lemar Lofty., MD;  Location: [facility] ENDOSCOPY;  Service: Gastroenterology;  Laterality: N/A;   Social History:  reports that she has never smoked. She has never used smokeless tobacco. She reports that she does not currently use alcohol after a past usage of about 7.0 standard drinks of alcohol per week. She reports that she does not use drugs. Family History:  Family History  Problem Relation Age of Onset   Other Mother        tachycardia.Marland KitchenMarland Kitchen?afib   Melanoma Mother    Stroke Father        after hernia suegery   Prostate cancer Father  Other Father        global transient amnesia, unclear source   Atrial fibrillation Sister    Healthy Brother    Healthy Brother    Brain cancer Maternal Grandfather        ?   Coronary artery disease Paternal Grandmother    Heart attack Paternal Grandmother 54   Cancer Paternal Grandfather        ?     HOME MEDICATIONS: Allergies as of 06/26/2023       Reactions   Ceftriaxone Hives   Immediate bright red itchy rash on bilateral legs after starting infusion.     Gluten Meal Other (See Comments)   Stomach distress   Oxycodone Itching   Zocor [simvastatin] Other (See Comments)   Elevated lfts?        Medication List        Accurate as of  June 26, 2023  7:03 AM. If you have any questions, ask your nurse or doctor.          Basaglar KwikPen 100 UNIT/ML Inject 30 Units into the skin daily. What changed:  how much to take additional instructions   CALCIUM PO Take 1 tablet by mouth in the morning and at bedtime.   cetirizine 10 MG tablet Commonly known as: ZYRTEC Take 10 mg by mouth at bedtime.   furosemide 20 MG tablet Commonly known as: LASIX TAKE 1 TABLET(20 MG) BY MOUTH DAILY   HYDROcodone-acetaminophen 5-325 MG tablet Commonly known as: Norco Take 1 tablet by mouth every 6 (six) hours as needed for moderate pain (pain score 4-6).   ibuprofen 800 MG tablet Commonly known as: ADVIL Take 1 tablet (800 mg total) by mouth every 6 (six) hours as needed.   insulin lispro 100 UNIT/ML KwikPen Commonly known as: HumaLOG KwikPen Max daily 30 units What changed: how much to take   Insulin Pen Needle 32G X 4 MM Misc 1 Device by Does not apply route in the morning, at noon, in the evening, and at bedtime.   ONE TOUCH ULTRA 2 w/Device Kit Use to check blood sugars two times day   OneTouch Ultra test strip Generic drug: glucose blood Check blood sugar 3 times daily   onetouch ultrasoft lancets Use to check blood sugar two times a day.   spironolactone 50 MG tablet Commonly known as: ALDACTONE TAKE 1 TABLET(50 MG) BY MOUTH DAILY   sulfamethoxazole-trimethoprim 800-160 MG tablet Commonly known as: BACTRIM DS Take 1 tablet by mouth 2 (two) times daily.   Vitamin B-12 5000 MCG Subl Place 5,000 mcg under the tongue daily.   VITAMIN D3 PO Take 2 capsules by mouth in the morning and at bedtime.           OBJECTIVE:   Vital Signs: There were no vitals taken for this visit.   Exam: General: Pt appears well and is in NAD  Neuro: MS is good with appropriate affect, pt is alert and Ox3   DM Foot Exam 04/17/2023 per podiatry    DATA REVIEWED:  Lab Results  Component Value Date   HGBA1C 7.3  (A) 06/25/2023   HGBA1C 7.6 (H) 07/03/2022   HGBA1C 7.4 (A) 04/06/2022    Latest Reference Range & Units 07/03/22 07:26  Sodium 135 - 145 mEq/L 135  Potassium 3.5 - 5.1 mEq/L 4.9  Chloride 96 - 112 mEq/L 99  CO2 19 - 32 mEq/L 26  Glucose 70 - 99 mg/dL 528 (H)  BUN 6 - 23 mg/dL  36 (H)  Creatinine 0.40 - 1.20 mg/dL 1.61  Calcium 8.4 - 09.6 mg/dL 04.5 (H)  Alkaline Phosphatase 39 - 117 U/L 199 (H)  Albumin 3.5 - 5.2 g/dL 4.3  AST 0 - 37 U/L 41 (H)  ALT 0 - 35 U/L 31  Total Protein 6.0 - 8.3 g/dL 8.2  Total Bilirubin 0.2 - 1.2 mg/dL 0.7  GFR >40.98 mL/min 65.61  (H): Data is abnormally high  Old records , labs and images have been reviewed.    ASSESSMENT / PLAN / RECOMMENDATIONS:   1) Type 2 Diabetes Mellitus, Sub-Optimally  controlled , With Neuropathic complications - Most recent A1c of 7.3 %. Goal A1c < 7.0 %.    -A1c continues to trend down - She is NOT interested in CGM technology due to easy bruising and bleeding tendency - She is intolerant to Metformin - She is not a candidate for GLP-1 agonists, or DPP-4 inhibitors due to hx of pancreatitis.  -[place] Pepper has been cost prohibitive  -I will decrease her basal insulin as below due to endorsed hypoglycemia -I will also increase her Humalog as below, patient is disappointed that her A1c is not lower than 7.3%, I did explain to the patient that this is most likely due to postprandial hyperglycemia and we may have to continue to increase her Humalog dose -One Touch ultra testing strips is costing $500, it appears that Accu-Chek guide has a better price, patient advised to contact her insurance to see if they have cheaper alternatives, she was also advised the [employer] ReliOn is an affordable alternative as well  MEDICATIONS: -Decrease  Basaglar 18 units daily -Change Humalog  8 units TIDQAC -Continue Correction factor : NovoLog (BG -130/30)    EDUCATION / INSTRUCTIONS: BG monitoring instructions: Patient is instructed to  check her blood sugars 3 times a day before each meal Call [facility] if: BG persistently < 70  I reviewed the Rule of 15 for the treatment of hypoglycemia in detail with the patient. Literature supplied.   2) Diabetic complications:  Eye: Does not have known diabetic retinopathy.  Neuro/ Feet: Does have known diabetic peripheral neuropathy. Renal: Patient does not have known baseline CKD. She is on an ACEI/ARB at present.      F/U in 6 months   Signed electronically by: Lyndle Herrlich, MD  [facility] End[facility]ology  [facility] [address] [place], [place] [address] Phone: [phone] FAX: [phone]   CC: Excell Seltzer, MD [address] [place] [address] Phone: [phone]  Fax: [phone]  Return to Endocrinology clinic as below: Future Appointments  Date Time Provider Department Center  06/26/2023  9:50 AM Itzae Mccurdy, Konrad Dolores, MD [facility] None

## 2023-06-27 ENCOUNTER — Other Ambulatory Visit: Payer: Self-pay | Admitting: Internal Medicine

## 2023-06-27 NOTE — Telephone Encounter (Signed)
I have left her another message to please come get the labs and make an appointment.

## 2023-06-27 NOTE — Telephone Encounter (Signed)
I left Mallory Stewart a detailed message that she needs to come get lab work done and to make an appointment with Dr Leone Payor.

## 2023-07-01 NOTE — Telephone Encounter (Signed)
 Lvm to schedule

## 2023-07-01 NOTE — Telephone Encounter (Signed)
 I have left another detailed message to please call me back so we can book an appointment, get her to come get labs so we can refill her rx.

## 2023-07-01 NOTE — Telephone Encounter (Signed)
 Mallory Stewart called back and told them to relay a message to me that she will come Friday for her lab work and that Dr Vanessa Barbara at the liver clinic will refill her spironolactone.

## 2023-07-02 NOTE — Telephone Encounter (Signed)
 LMx1 to schedule 6 month follow up with Dr. Lonzo Cloud.

## 2023-07-16 ENCOUNTER — Other Ambulatory Visit: Payer: Self-pay | Admitting: Internal Medicine

## 2023-07-16 DIAGNOSIS — K7031 Alcoholic cirrhosis of liver with ascites: Secondary | ICD-10-CM

## 2023-07-16 DIAGNOSIS — E43 Unspecified severe protein-calorie malnutrition: Secondary | ICD-10-CM

## 2023-07-25 ENCOUNTER — Ambulatory Visit
Admission: RE | Admit: 2023-07-25 | Discharge: 2023-07-25 | Disposition: A | Discharge status: Home / Self Care | Source: Ambulatory Visit | Attending: Internal Medicine | Admitting: Internal Medicine

## 2023-07-25 DIAGNOSIS — E43 Unspecified severe protein-calorie malnutrition: Secondary | ICD-10-CM

## 2023-07-25 DIAGNOSIS — K7031 Alcoholic cirrhosis of liver with ascites: Secondary | ICD-10-CM

## 2023-08-21 ENCOUNTER — Other Ambulatory Visit: Payer: Self-pay | Admitting: Internal Medicine

## 2023-08-21 NOTE — Telephone Encounter (Signed)
 Patient needs a Lasix refill, She is seen by a [facility] GI doctor that you referred her to. Her last visit with you was 2024. Does she need to contact the for them for refills? Thanks

## 2023-08-21 NOTE — Telephone Encounter (Signed)
 Needs to ask [facility] for refills - Dr. Zamor said he refilled x 1 year in March note

## 2023-09-03 ENCOUNTER — Ambulatory Visit: Admitting: Family Medicine

## 2023-09-03 ENCOUNTER — Encounter: Payer: Self-pay | Admitting: Family Medicine

## 2023-09-03 ENCOUNTER — Ambulatory Visit: Payer: Self-pay

## 2023-09-03 VITALS — BP 140/90 | HR 92 | Temp 98.2°F | Ht 68.0 in | Wt 148.5 lb

## 2023-09-03 DIAGNOSIS — M545 Low back pain, unspecified: Secondary | ICD-10-CM | POA: Insufficient documentation

## 2023-09-03 MED ORDER — DICLOFENAC SODIUM 75 MG PO TBEC: 75.0000 mg | DELAYED_RELEASE_TABLET | Freq: Two times a day (BID) | ORAL | 0 refills | Status: DC

## 2023-09-03 MED ORDER — METHOCARBAMOL 500 MG PO TABS: 500.0000 mg | ORAL_TABLET | Freq: Three times a day (TID) | ORAL | 0 refills | Status: DC | PRN

## 2023-09-03 MED ORDER — TRAMADOL HCL 50 MG PO TABS: 50.0000 mg | ORAL_TABLET | Freq: Three times a day (TID) | ORAL | 0 refills | Status: AC | PRN

## 2023-09-03 NOTE — Telephone Encounter (Signed)
 Chief Complaint: Back Pain Symptoms: 8/10 pain, reduced movement Frequency: Injury today Pertinent Negatives: Patient denies fever, urinary symptoms Disposition: [] ED /[] Urgent Care (no appt availability in office) / [x] Appointment(In office/virtual)/ []  [facility]/ [] Home Care/ [] Refused Recommended Disposition /[] Time Mobile Bus/ []  Follow-up with PCP Additional Notes: Pt reports she was at the gym this AM doing new exercises with a trainer, she notes feeling the "muscle pull" in her low back and reports she is now experiencing 8/10 pain with movement and intermittent muscle spasms. OV scheduled. This RN educated pt on home care, new-worsening symptoms, when to call back/seek emergent care. Pt verbalized understanding and agrees to plan.    Copied from CRM [id number]. Topic: Clinical - Red Word Triage >> Sep 03, 2023  1:27 PM [place] E wrote: [facility] that prompted transfer to Nurse Triage: Back pain. Patient states she pulled a muscle in her back this morning and rated the pain a level 4-8 out of 10. Patient also stated she can barely walk because of the pain. Reason for Disposition  [1] SEVERE back pain (e.g., excruciating, unable to do any normal activities) AND [2] not improved 2 hours after pain medicine  Answer Assessment - Initial Assessment Questions 1. ONSET: "When did the pain begin?"      Today 2. LOCATION: "Where does it hurt?" (upper, mid or lower back)     Low back 3. SEVERITY: "How bad is the pain?"  (e.g., Scale 1-10; mild, moderate, or severe)   - MILD (1-3): Doesn't interfere with normal activities.    - MODERATE (4-7): Interferes with normal activities or awakens from sleep.    - SEVERE (8-10): Excruciating pain, unable to do any normal activities.      5/10 at rest, 8/10 with movement 4. PATTERN: "Is the pain constant?" (e.g., yes, no; constant, intermittent)      Constant, intermittent muscle spasms 5. RADIATION: "Does the pain shoot into your  legs or somewhere else?"     Across entire low back 6. CAUSE:  "What do you think is causing the back pain?"      Pt was exercising and felt muscle pull at gym 8. MEDICINES: "What have you taken so far for the pain?" (e.g., nothing, acetaminophen , NSAIDS)     None 9. NEUROLOGIC SYMPTOMS: "Do you have any weakness, numbness, or problems with bowel/bladder control?"     None 10. OTHER SYMPTOMS: "Do you have any other symptoms?" (e.g., fever, abdomen pain, burning with urination, blood in urine)       None  Protocols used: Back Pain-A-AH

## 2023-09-03 NOTE — Progress Notes (Signed)
 Patient ID: Mallory Stewart, female    DOB: 1960-06-05, 63 y.o.   MRN: [id number]  This visit was conducted in person.  BP (!) 140/90 (BP Location: Left Arm, Patient Position: Sitting, Cuff Size: Normal)   Pulse 92   Temp 98.2 F (36.8 C) (Temporal)   Ht 5\' 8"  (1.727 m)   Wt 148 lb 8 oz (67.4 kg)   SpO2 98%   BMI 22.58 kg/m    CC:  Chief Complaint  Patient presents with   Back Pain    Low Back/Pulled Muscle-Hurt working out this morning    Subjective:   HPI: Mallory Stewart is a 63 y.o. female with history of diabetes, chronic pancreatitis and cirrhosis presenting on 09/03/2023 for Back Pain (Low Back/Pulled Muscle-Hurt working out this morning)   New onset low back pain, started this AM while working out.  No fall.  She was working out doing an oblique exercise.. felt sudden sharp pain midline low back,  bilateral.  Feels tight, pain at rest, spasm with movement. No radiation of pain.   No new weakness, no numbness.   No fever, no dysuria.  Has not used any med tp treat.     Relevant past medical, surgical, family and social history reviewed and updated as indicated. Interim medical history since our last visit reviewed. Allergies and medications reviewed and updated. Outpatient Medications Prior to Visit  Medication Sig Dispense Refill   Blood Glucose Monitoring Suppl (ACCU-CHEK GUIDE) w/Device KIT 1 Device by Does not apply route 3 (three) times daily. 1 kit 0   CALCIUM PO Take 1 tablet by mouth daily.     cetirizine (ZYRTEC) 10 MG tablet Take 10 mg by mouth at bedtime.     Cyanocobalamin  (VITAMIN B-12) 5000 MCG SUBL Place 5,000 mcg under the tongue daily.     furosemide  (LASIX ) 20 MG tablet TAKE 1 TABLET(20 MG) BY MOUTH DAILY 90 tablet 1   glucose blood (ACCU-CHEK GUIDE TEST) test strip 1 each by Other route 3 (three) times daily. Use as instructed 300 each 3   Insulin  Glargine (BASAGLAR  KWIKPEN) 100 UNIT/ML Inject 18 Units into the skin daily. 15 mL 4    insulin  lispro (HUMALOG  KWIKPEN) 100 UNIT/ML KwikPen Max daily 45 units 45 mL 3   Insulin  Pen Needle 32G X 4 MM MISC 1 Device by Does not apply route in the morning, at noon, in the evening, and at bedtime. 400 each 3   Lancets (ONETOUCH ULTRASOFT) lancets Use to check blood sugar two times a day. 200 each 3   MAGNESIUM  PO Take 1 tablet by mouth daily.     spironolactone  (ALDACTONE ) 50 MG tablet TAKE 1 TABLET(50 MG) BY MOUTH DAILY 30 tablet 0   Cholecalciferol (VITAMIN D3 PO) Take 2 capsules by mouth in the morning and at bedtime.     HYDROcodone -acetaminophen  (NORCO) 5-325 MG tablet Take 1 tablet by mouth every 6 (six) hours as needed for moderate pain (pain score 4-6). (Patient not taking: Reported on 06/26/2023) 30 tablet 0   ibuprofen  (ADVIL ) 800 MG tablet Take 1 tablet (800 mg total) by mouth every 6 (six) hours as needed. 60 tablet 1   No facility-administered medications prior to visit.     Per HPI unless specifically indicated in ROS section below Review of Systems  Constitutional:  Negative for fatigue and fever.  HENT:  Negative for congestion.   Eyes:  Negative for pain.  Respiratory:  Negative for cough and shortness of  breath.   Cardiovascular:  Negative for chest pain, palpitations and leg swelling.  Gastrointestinal:  Negative for abdominal pain.  Genitourinary:  Negative for dysuria and vaginal bleeding.  Musculoskeletal:  Positive for myalgias. Negative for back pain.  Neurological:  Negative for syncope, light-headedness and headaches.  Psychiatric/Behavioral:  Negative for dysphoric mood.    Objective:  BP (!) 140/90 (BP Location: Left Arm, Patient Position: Sitting, Cuff Size: Normal)   Pulse 92   Temp 98.2 F (36.8 C) (Temporal)   Ht 5\' 8"  (1.727 m)   Wt 148 lb 8 oz (67.4 kg)   SpO2 98%   BMI 22.58 kg/m   Wt Readings from Last 3 Encounters:  09/03/23 148 lb 8 oz (67.4 kg)  03/26/23 152 lb (68.9 kg)  02/13/23 152 lb (68.9 kg)      Physical  Exam Constitutional:      General: She is not in acute distress.    Appearance: Normal appearance. She is well-developed. She is not ill-appearing or toxic-appearing.  HENT:     Head: Normocephalic.     Right Ear: Hearing, tympanic membrane, ear canal and external ear normal. Tympanic membrane is not erythematous, retracted or bulging.     Left Ear: Hearing, tympanic membrane, ear canal and external ear normal. Tympanic membrane is not erythematous, retracted or bulging.     Nose: No mucosal edema or rhinorrhea.     Right Sinus: No maxillary sinus tenderness or frontal sinus tenderness.     Left Sinus: No maxillary sinus tenderness or frontal sinus tenderness.     Mouth/Throat:     Pharynx: Uvula midline.  Eyes:     General: Lids are normal. Lids are everted, no foreign bodies appreciated.     Conjunctiva/sclera: Conjunctivae normal.     Pupils: Pupils are equal, round, and reactive to light.  Neck:     Thyroid : No thyroid  mass or thyromegaly.     Vascular: No carotid bruit.     Trachea: Trachea normal.  Cardiovascular:     Rate and Rhythm: Normal rate and regular rhythm.     Pulses: Normal pulses.     Heart sounds: Normal heart sounds, S1 normal and S2 normal. No murmur heard.    No friction rub. No gallop.  Pulmonary:     Effort: Pulmonary effort is normal. No tachypnea or respiratory distress.     Breath sounds: Normal breath sounds. No decreased breath sounds, wheezing, rhonchi or rales.  Abdominal:     General: Bowel sounds are normal.     Palpations: Abdomen is soft.     Tenderness: There is no abdominal tenderness.  Musculoskeletal:     Cervical back: Normal, normal range of motion and neck supple.     Thoracic back: Normal.     Lumbar back: Spasms and tenderness present. No bony tenderness. Decreased range of motion. Negative right straight leg raise test and negative left straight leg raise test.     Comments: Ttp over bilateral paraspinous muscles in lumbar spine   Skin:    General: Skin is warm and dry.     Findings: No rash.  Neurological:     Mental Status: She is alert.  Psychiatric:        Mood and Affect: Mood is not anxious or depressed.        Speech: Speech normal.        Behavior: Behavior normal. Behavior is cooperative.        Thought Content: Thought content normal.  Judgment: Judgment normal.       Results for orders placed or performed in visit on 06/25/23  POCT glycosylated hemoglobin (Hb A1C)   Collection Time: 06/25/23 10:12 AM  Result Value Ref Range   Hemoglobin A1C 7.3 (A) 4.0 - 5.6 %   HbA1c POC (<> result, manual entry)     HbA1c, POC (prediabetic range)     HbA1c, POC (controlled diabetic range)     *Note: Due to a large number of results and/or encounters for the requested time period, some results have not been displayed. A complete set of results can be found in Results Review.    Assessment and Plan  Acute midline low back pain without sciatica Assessment & Plan: Acute, most likely musculoskeletal strain/spasm.  No red flags or indication for imaging.  Start heat and gentle stretching as soon as tolerable.  Treat with diclofenac  75 mg p.o. twice daily as well as with methocarbamol  500 mg 3 times daily as needed for muscle spasm. Will use tramadol  25 to 50 mg every 8 hours as needed for breakthrough pain.  Return and ER precautions provided.   Other orders -     Methocarbamol ; Take 1 tablet (500 mg total) by mouth every 8 (eight) hours as needed for muscle spasms.  Dispense: 30 tablet; Refill: 0 -     Diclofenac  Sodium; Take 1 tablet (75 mg total) by mouth 2 (two) times daily.  Dispense: 30 tablet; Refill: 0 -     traMADol  HCl; Take 1 tablet (50 mg total) by mouth every 8 (eight) hours as needed for up to 5 days for moderate pain (pain score 4-6) (for break through pain).  Dispense: 15 tablet; Refill: 0    No follow-ups on file.   Herby Lolling, MD

## 2023-09-03 NOTE — Assessment & Plan Note (Signed)
 Acute, most likely musculoskeletal strain/spasm.  No red flags or indication for imaging.  Start heat and gentle stretching as soon as tolerable.  Treat with diclofenac  75 mg p.o. twice daily as well as with methocarbamol  500 mg 3 times daily as needed for muscle spasm. Will use tramadol  25 to 50 mg every 8 hours as needed for breakthrough pain.  Return and ER precautions provided.

## 2023-09-12 ENCOUNTER — Encounter: Payer: Self-pay | Admitting: Family Medicine

## 2023-09-12 ENCOUNTER — Ambulatory Visit: Admitting: Family Medicine

## 2023-09-12 VITALS — BP 140/78 | HR 99 | Temp 99.3°F | Ht 68.0 in | Wt 149.1 lb

## 2023-09-12 DIAGNOSIS — R140 Abdominal distension (gaseous): Secondary | ICD-10-CM | POA: Diagnosis not present

## 2023-09-12 DIAGNOSIS — J0100 Acute maxillary sinusitis, unspecified: Secondary | ICD-10-CM | POA: Diagnosis not present

## 2023-09-12 LAB — TSH: TSH: 2.28 u[IU]/mL (ref 0.35–5.50)

## 2023-09-12 MED ORDER — AZITHROMYCIN 250 MG PO TABS: ORAL_TABLET | ORAL | 0 refills | Status: DC

## 2023-09-12 NOTE — Addendum Note (Signed)
 Addended by: Herby Lolling E on: 09/12/2023 02:17 PM   Modules accepted: Orders

## 2023-09-12 NOTE — Telephone Encounter (Signed)
 I spoke to the patient to let her know the order has been released into the [facility] system and for her to call for further instructions at [facility].

## 2023-09-12 NOTE — Progress Notes (Signed)
 Patient ID: Mallory Stewart, female    DOB: July 22, 1960, 63 y.o.   MRN: [id number]  This visit was conducted in person.  BP (!) 140/78   Pulse 99   Temp 99.3 F (37.4 C) (Temporal)   Ht 5\' 8"  (1.727 m)   Wt 149 lb 2 oz (67.6 kg)   SpO2 100%   BMI 22.67 kg/m    CC:  Chief Complaint  Patient presents with   Sinusitis   Bloated    Lower Abd    Subjective:   HPI: Mallory Stewart is a 63 y.o. female presenting on 09/12/2023 for Sinusitis and Bloated (Lower Abd)   New onset sinusitis    Facial pressure, heaviness, ears full we for 5-6 days worsening  TTP under eyes.  Productive nasal discharge, green thick.  Productive cough.  No SOB, no fever.  Low grade temp.  No COVID exposure.  Using Xyzal and nasocort.    In last several months lower abdominal bloating. Stopped sodas... improved  minimally. No abdominal pain.  No diarrhea, some larger calibre stools lately. Not straining.  She feels the she notes it more after dairy.Aaron Aas decreasing this did not help. Had changed to almond milk  No blood in stool.  Drinking protein shakes.  Already gluten free.  Drinking lorts of water , good fiber intake.   She has noted 2 times  larger after almonds.  Eats almonds all the time.   Colonoscopy:  Has never had.   Had been on antibiotics multiple times in last last.   C diff 2 years ago.  Just started Bioma 2 weeks ago... probiotic... not helping.  Lab Results  Component Value Date   HGBA1C 7.3 (A) 06/25/2023     Seeing Dr. Zamoor with [facility] for chronic   Labs reviewed. CBC, CMET Relevant past medical, surgical, family and social history reviewed and updated as indicated. Interim medical history since our last visit reviewed. Allergies and medications reviewed and updated. Outpatient Medications Prior to Visit  Medication Sig Dispense Refill   Blood Glucose Monitoring Suppl (ACCU-CHEK GUIDE) w/Device KIT 1 Device by Does not apply route 3 (three) times daily. 1 kit 0    CALCIUM PO Take 1 tablet by mouth daily.     cetirizine (ZYRTEC) 10 MG tablet Take 10 mg by mouth at bedtime.     Cyanocobalamin  (VITAMIN B-12) 5000 MCG SUBL Place 5,000 mcg under the tongue daily.     diclofenac  (VOLTAREN ) 75 MG EC tablet Take 1 tablet (75 mg total) by mouth 2 (two) times daily. 30 tablet 0   furosemide  (LASIX ) 20 MG tablet TAKE 1 TABLET(20 MG) BY MOUTH DAILY 90 tablet 1   glucose blood (ACCU-CHEK GUIDE TEST) test strip 1 each by Other route 3 (three) times daily. Use as instructed 300 each 3   Insulin  Glargine (BASAGLAR  KWIKPEN) 100 UNIT/ML Inject 18 Units into the skin daily. 15 mL 4   insulin  lispro (HUMALOG  KWIKPEN) 100 UNIT/ML KwikPen Max daily 45 units 45 mL 3   Insulin  Pen Needle 32G X 4 MM MISC 1 Device by Does not apply route in the morning, at noon, in the evening, and at bedtime. 400 each 3   Lancets (ONETOUCH ULTRASOFT) lancets Use to check blood sugar two times a day. 200 each 3   MAGNESIUM  PO Take 1 tablet by mouth daily.     methocarbamol  (ROBAXIN ) 500 MG tablet Take 1 tablet (500 mg total) by mouth every 8 (eight) hours as needed  for muscle spasms. 30 tablet 0   spironolactone  (ALDACTONE ) 50 MG tablet TAKE 1 TABLET(50 MG) BY MOUTH DAILY 30 tablet 0   No facility-administered medications prior to visit.     Per HPI unless specifically indicated in ROS section below Review of Systems  Constitutional:  Negative for fatigue and fever.  HENT:  Negative for congestion.   Eyes:  Negative for pain.  Respiratory:  Negative for cough and shortness of breath.   Cardiovascular:  Negative for chest pain, palpitations and leg swelling.  Gastrointestinal:  Negative for abdominal pain.  Genitourinary:  Negative for dysuria and vaginal bleeding.  Musculoskeletal:  Negative for back pain.  Neurological:  Negative for syncope, light-headedness and headaches.  Psychiatric/Behavioral:  Negative for dysphoric mood.    Objective:  BP (!) 140/78   Pulse 99   Temp 99.3 F  (37.4 C) (Temporal)   Ht 5\' 8"  (1.727 m)   Wt 149 lb 2 oz (67.6 kg)   SpO2 100%   BMI 22.67 kg/m   Wt Readings from Last 3 Encounters:  09/12/23 149 lb 2 oz (67.6 kg)  09/03/23 148 lb 8 oz (67.4 kg)  03/26/23 152 lb (68.9 kg)      Physical Exam Constitutional:      General: She is not in acute distress.    Appearance: Normal appearance. She is well-developed. She is not ill-appearing or toxic-appearing.  HENT:     Head: Normocephalic.     Right Ear: Hearing, tympanic membrane, ear canal and external ear normal. Tympanic membrane is not erythematous, retracted or bulging.     Left Ear: Hearing, tympanic membrane, ear canal and external ear normal. Tympanic membrane is not erythematous, retracted or bulging.     Nose: No mucosal edema or rhinorrhea.     Right Sinus: No maxillary sinus tenderness or frontal sinus tenderness.     Left Sinus: No maxillary sinus tenderness or frontal sinus tenderness.     Mouth/Throat:     Pharynx: Uvula midline.  Eyes:     General: Lids are normal. Lids are everted, no foreign bodies appreciated.     Conjunctiva/sclera: Conjunctivae normal.     Pupils: Pupils are equal, round, and reactive to light.  Neck:     Thyroid : No thyroid  mass or thyromegaly.     Vascular: No carotid bruit.     Trachea: Trachea normal.  Cardiovascular:     Rate and Rhythm: Normal rate and regular rhythm.     Pulses: Normal pulses.     Heart sounds: Normal heart sounds, S1 normal and S2 normal. No murmur heard.    No friction rub. No gallop.  Pulmonary:     Effort: Pulmonary effort is normal. No tachypnea or respiratory distress.     Breath sounds: Normal breath sounds. No decreased breath sounds, wheezing, rhonchi or rales.  Abdominal:     General: Bowel sounds are normal.     Palpations: Abdomen is soft.     Tenderness: There is no abdominal tenderness.  Musculoskeletal:     Cervical back: Normal range of motion and neck supple.  Skin:    General: Skin is warm and  dry.     Findings: No rash.  Neurological:     Mental Status: She is alert.  Psychiatric:        Mood and Affect: Mood is not anxious or depressed.        Speech: Speech normal.        Behavior: Behavior normal. Behavior  is cooperative.        Thought Content: Thought content normal.        Judgment: Judgment normal.       Results for orders placed or performed in visit on 06/25/23  POCT glycosylated hemoglobin (Hb A1C)   Collection Time: 06/25/23 10:12 AM  Result Value Ref Range   Hemoglobin A1C 7.3 (A) 4.0 - 5.6 %   HbA1c POC (<> result, manual entry)     HbA1c, POC (prediabetic range)     HbA1c, POC (controlled diabetic range)     *Note: Due to a large number of results and/or encounters for the requested time period, some results have not been displayed. A complete set of results can be found in Results Review.    Assessment and Plan  Acute non-recurrent maxillary sinusitis Assessment & Plan: Acute, worsening. Will treat with antibiotics given purulent mucus and facial pain.  Recommend continued Allermed-M education including nasal steroid and antihistamine.  Add nasal saline spray or irrigation.  Reviewed ER and return precautions.   Abdominal bloating Assessment & Plan:  Chronic now ongoing  about 5 months.   No past colonoscopy.   Will start with allergy , alpha gal testing. Check TSH.  She will look into lactose  intolerance testing at [facility].  She will keep a food diary and consider more consistent  holding of lactose.  Alternate diagnoses possible certainly include diabetic gastroparesis or bacterial overgrowth.  If testing is negative here in the office and she has not noted improvement she will return to see GI for further evaluation.  Orders: -     Food Allergy Profile -     Alpha-Gal Panel -     TSH  Other orders -     Azithromycin; 2 tab po x 1 day then 1 tab po daily  Dispense: 6 tablet; Refill: 0    No follow-ups on file.   Herby Lolling,  MD

## 2023-09-12 NOTE — Assessment & Plan Note (Signed)
 Acute, worsening. Will treat with antibiotics given purulent mucus and facial pain.  Recommend continued Allermed-M education including nasal steroid and antihistamine.  Add nasal saline spray or irrigation.  Reviewed ER and return precautions.

## 2023-09-12 NOTE — Assessment & Plan Note (Signed)
 Chronic now ongoing  about 5 months.   No past colonoscopy.   Will start with allergy , alpha gal testing. Check TSH.  She will look into lactose  intolerance testing at [facility].  She will keep a food diary and consider more consistent  holding of lactose.  Alternate diagnoses possible certainly include diabetic gastroparesis or bacterial overgrowth.  If testing is negative here in the office and she has not noted improvement she will return to see GI for further evaluation.

## 2023-09-16 LAB — MISC HAZELNUT COMP PNL
Cor a1(f428): <0.1 kU/L (ref <0.10)
Cor a14(f439): <0.1 kU/L (ref <0.10)
Cor a8(f425): <0.1 kU/L (ref <0.10)
Cor a9(f440): <0.1 kU/L (ref <0.10)

## 2023-09-16 LAB — FOOD ALLERGY PROFILE
Allergen, Salmon, f41: <0.1 kU/L
Almonds: 0.14 kU/L — ABNORMAL HIGH
Brazil Nut: <0.1 kU/L
CLASS: 0
CLASS: 0
CLASS: 0
CLASS: 0
CLASS: 0
CLASS: 0
Cashew IgE: <0.1 kU/L
Class: 0
Class: 0
Class: 1
Class: 2
Egg White IgE: 1.24 kU/L — ABNORMAL HIGH
Fish Cod: <0.1 kU/L
Hazelnut: 0.13 kU/L — ABNORMAL HIGH
Macadamia Nut: <0.1 kU/L
Milk IgE: 0.18 kU/L — ABNORMAL HIGH
Peanut IgE: 0.45 kU/L — ABNORMAL HIGH
Scallop IgE: <0.1 kU/L
Sesame Seed f10: 0.23 kU/L — ABNORMAL HIGH
Shrimp IgE: <0.1 kU/L
Soybean IgE: 0.16 kU/L — ABNORMAL HIGH
Tuna IgE: <0.1 kU/L
Walnut: 0.16 kU/L — ABNORMAL HIGH
Wheat IgE: 0.14 kU/L — ABNORMAL HIGH

## 2023-09-16 LAB — ALPHA-GAL PANEL
Allergen, Pork, f26: <0.1 kU/L
Allergen, Pork, f26: <0.1 kU/L
Beef: <0.1 kU/L
CLASS: 0
CLASS: 0
Class: 0
GALACTOSE-ALPHA-1,3-GALACTOSE IGE*: <0.1 kU/L (ref <0.10)

## 2023-09-16 LAB — MILK COMPONENT PANEL RFLX
Allergen, Alpha-lactalb,f76: <0.1 kU/L
Allergen, Beta-lactoglob,f77: 0.17 kU/L — ABNORMAL HIGH
Allergen, Casein, f78: 0.1 kU/L — ABNORMAL HIGH
CLASS: 0

## 2023-09-16 LAB — PEANUT COMPONENT PANEL REFLEX
Ara h 1 (f422): <0.1 kU/L (ref <0.10)
Ara h 2 (f423): <0.1 kU/L (ref <0.10)
Ara h 3 (f424): <0.1 kU/L (ref <0.10)
Ara h 8 (f352): <0.1 kU/L (ref <0.10)
Ara h 9 (f427: 0.28 kU/L — ABNORMAL HIGH (ref <0.10)
F447-IgE Ara h 6: <0.1 kU/L (ref <0.10)

## 2023-09-16 LAB — EGG COMPONENT PANEL REFLEX
Allergen, Ovalbumin, f232: 0.9 kU/L — ABNORMAL HIGH
Allergen, Ovomucoid, f233: 1.45 kU/L — ABNORMAL HIGH
CLASS: 2
CLASS: 2

## 2023-09-16 LAB — INTERPRETATION:

## 2023-09-27 ENCOUNTER — Telehealth: Payer: Self-pay | Admitting: Internal Medicine

## 2023-09-27 NOTE — Telephone Encounter (Signed)
 Inbound call from patient stating she has been having a significant amount of bloating and pressure in her abdomen. States it feels as though it is going to "explode". Patient is requesting a call back. Please advise, thank you.

## 2023-09-27 NOTE — Telephone Encounter (Signed)
 I was able to speak with the pt and discussed the bloating and abd pain she has had.  She states she saw PCP and was told it is possible food allergies  See below;  Abdominal bloating Assessment & Plan:  Chronic now ongoing  about 5 months.    No past colonoscopy.    Will start with allergy  , alpha gal testing. Check TSH.  She will look into lactose  intolerance testing at [facility].  She will keep a food diary and consider more consistent  holding of lactose.   Alternate diagnoses possible certainly include diabetic gastroparesis or bacterial overgrowth.  If testing is negative here in the office and she has not noted improvement she will return to see GI for further evaluation.   Orders: -     Food Allergy  Profile -     Alpha-Gal Panel -     TSH   She was offered an appt on 6/24 with Reginal Capra PA but pt declined.  She states she is going to the UC and will call back if she wants to make an appt with our office.

## 2023-10-02 LAB — LACTOSE TOLERANCE TEST
Glucose, 1 hour: 268 mg/dL
Glucose: 362 mg/dL
Glucose: 387 mg/dL
Glucose: 415 mg/dL
Glucose: 419 mg/dL

## 2023-10-04 ENCOUNTER — Ambulatory Visit: Payer: Self-pay | Admitting: Family Medicine

## 2023-10-07 ENCOUNTER — Ambulatory Visit: Payer: Self-pay

## 2023-10-07 ENCOUNTER — Ambulatory Visit
Admission: RE | Admit: 2023-10-07 | Discharge: 2023-10-07 | Disposition: A | Discharge status: Home / Self Care | Source: Ambulatory Visit | Attending: Family Medicine | Admitting: Family Medicine

## 2023-10-07 ENCOUNTER — Encounter: Payer: Self-pay | Admitting: Family Medicine

## 2023-10-07 ENCOUNTER — Ambulatory Visit: Admitting: Family Medicine

## 2023-10-07 VITALS — BP 128/78 | HR 87 | Temp 99.0°F | Ht 68.0 in | Wt 151.5 lb

## 2023-10-07 DIAGNOSIS — M7051 Other bursitis of knee, right knee: Secondary | ICD-10-CM | POA: Diagnosis not present

## 2023-10-07 DIAGNOSIS — M25561 Pain in right knee: Secondary | ICD-10-CM | POA: Diagnosis not present

## 2023-10-07 MED ORDER — DICLOFENAC SODIUM 75 MG PO TBEC: 75.0000 mg | DELAYED_RELEASE_TABLET | Freq: Two times a day (BID) | ORAL | 0 refills | Status: DC

## 2023-10-07 NOTE — Progress Notes (Signed)
 Mallory Gowdy T. Kweku Stankey, MD, CAQ Sports Medicine [facility] [address] [place], [address]  Phone: [phone]  FAX: [phone]  Mallory Stewart - 63 y.o. female  MRN [id number]  Date of Birth: 09-May-1960  Date: 10/07/2023  PCP: Mallory Novas, MD  Referral: Mallory Novas, MD  Chief Complaint  Patient presents with   Knee Pain    Right   Subjective:   Mallory Stewart is a 63 y.o. very pleasant female patient with Body mass index is 23.04 kg/m. who presents with the following:  Mallory Stewart is a very pleasant patient who I have known for many years.  She presents with some right sided medial knee pain that has been present in the last couple of weeks.  She has not counted any kind of specific fall or injury.  Over the last 2 months, she has started to progress her workouts and is working out with weights about 3 days a week and walking 2 miles about 4 days a week.  She has ramped up her walking over the last 2 months.  Over the last month it is slowly progressively getting worse.  She has been resting for about the last 5 days, does not think this really has helped any significantly.  Motrin , still not better.   Review of Systems is noted in the HPI, as appropriate  Objective:   BP 128/78   Pulse 87   Temp 99 F (37.2 C) (Temporal)   Ht 5\' 8"  (1.727 m)   Wt 151 lb 8 oz (68.7 kg)   SpO2 96%   BMI 23.04 kg/m   GEN: No acute distress; alert,appropriate. PULM: Breathing comfortably in no respiratory distress PSYCH: Normally interactive.   Right knee: Minimal effusion No pain with loading the patellar facets Full extension and flexion to 125 Minimal to mild joint line tenderness She does have some significant tenderness at the Pez anserine bursa as well as along the pes anserinus tendons going somewhat caudally around the medial knee Flexion pinch and McMurray's testing are negative Mild pain with resistance at  extension Mild pain with resistance testing at the hamstring, 4/5  Laboratory and Imaging Data:  Assessment and Plan:     ICD-10-CM   1. Pes anserinus bursitis of right knee  M70.51     2. Acute pain of right knee  M25.561 DG Knee 4 Views W/Patella Right     Radiographically, the patient has minimal degenerative change with some very mild peaking of the tibial spines.  Consistent with pes anserinus bursitis as well as some tendinopathy.  I gave her a comprehensive rehab program to work on.  Crosstraining for the next 2 weeks, avoiding walking, hiking, or impact.  May train with a exercise bicycle, in the pool, or Erk.  She is also going to use some oral Voltaren  over the next 2 weeks.  She is also going to add in some topical Voltaren  gel and icing.  If she continues to make no significant progress over the next 3 to 4 weeks, we could do an injection.  Medication Management during today's office visit: Meds ordered this encounter  Medications   diclofenac  (VOLTAREN ) 75 MG EC tablet    Sig: Take 1 tablet (75 mg total) by mouth 2 (two) times daily.    Dispense:  60 tablet    Refill:  0   Medications Discontinued During This Encounter  Medication Reason   Lancets (ONETOUCH ULTRASOFT)  lancets Completed Course   methocarbamol  (ROBAXIN ) 500 MG tablet Completed Course   diclofenac  (VOLTAREN ) 75 MG EC tablet Completed Course   azithromycin  (ZITHROMAX ) 250 MG tablet Completed Course    Orders placed today for conditions managed today: Orders Placed This Encounter  Procedures   DG Knee 4 Views W/Patella Right    Disposition: No follow-ups on file.  [employer] speech-to-text software was used for transcription in this dictation.  Possible transcriptional errors can occur using Animal nutritionist.   Signed,  Ranny Bye. Mallory Velasquez, MD   Outpatient Encounter Medications as of 10/07/2023  Medication Sig   Blood Glucose Monitoring Suppl (ACCU-CHEK GUIDE) w/Device KIT 1  Device by Does not apply route 3 (three) times daily.   CALCIUM PO Take 1 tablet by mouth daily.   cetirizine (ZYRTEC) 10 MG tablet Take 10 mg by mouth at bedtime.   Cyanocobalamin  (VITAMIN B-12) 5000 MCG SUBL Place 5,000 mcg under the tongue every other day.   diclofenac  (VOLTAREN ) 75 MG EC tablet Take 1 tablet (75 mg total) by mouth 2 (two) times daily.   furosemide  (LASIX ) 20 MG tablet TAKE 1 TABLET(20 MG) BY MOUTH DAILY   glucose blood (ACCU-CHEK GUIDE TEST) test strip 1 each by Other route 3 (three) times daily. Use as instructed   Insulin  Glargine (BASAGLAR  KWIKPEN) 100 UNIT/ML Inject 18 Units into the skin daily.   insulin  lispro (HUMALOG  KWIKPEN) 100 UNIT/ML KwikPen Max daily 45 units   Insulin  Pen Needle 32G X 4 MM MISC 1 Device by Does not apply route in the morning, at noon, in the evening, and at bedtime.   MAGNESIUM  PO Take 1 tablet by mouth daily.   spironolactone  (ALDACTONE ) 50 MG tablet TAKE 1 TABLET(50 MG) BY MOUTH DAILY   [DISCONTINUED] azithromycin  (ZITHROMAX ) 250 MG tablet 2 tab po x 1 day then 1 tab po daily   [DISCONTINUED] diclofenac  (VOLTAREN ) 75 MG EC tablet Take 1 tablet (75 mg total) by mouth 2 (two) times daily.   [DISCONTINUED] Lancets (ONETOUCH ULTRASOFT) lancets Use to check blood sugar two times a day.   [DISCONTINUED] methocarbamol  (ROBAXIN ) 500 MG tablet Take 1 tablet (500 mg total) by mouth every 8 (eight) hours as needed for muscle spasms.   No facility-administered encounter medications on file as of 10/07/2023.

## 2023-10-07 NOTE — Telephone Encounter (Signed)
 Copied from CRM [id number]. Topic: Clinical - Red Word Triage >> Oct 07, 2023 11:04 AM Chuck Crater wrote: Red Word that prompted transfer to Nurse Triage: Patient has extreme pain when walking (makes her gasp a little) and swelling in her right knee . She wants to schedule an appointment with Dr. Geralyn Knee.  Call was disconnected during transfer.  Attempted to call patient back x 3, left vm.

## 2023-10-07 NOTE — Telephone Encounter (Signed)
 Patient called back; apt scheduled for today; declined triage.

## 2023-10-07 NOTE — Patient Instructions (Signed)
 Voltaren 1% gel, over the counter ?You can apply up to 4 times a day ? ?This can be applied to any joint: knee, wrist, fingers, elbows, shoulders, feet and ankles. ?Can apply to any tendon: tennis elbow, achilles, tendon, rotator cuff or any other tendon. ? ?Minimal is absorbed in the bloodstream: ok with oral anti-inflammatory or a blood thinner. ? ?Cost is about 9 dollars  ?

## 2023-10-16 ENCOUNTER — Telehealth: Payer: Self-pay

## 2023-10-16 NOTE — Transitions of Care (Post Inpatient/ED Visit) (Addendum)
 Unable to reach patient by phone and left v/m requesting call back at [phone].       10/16/2023  Name: Mallory Stewart MRN: [id number] DOB: 24-Jan-1961  Today's TOC FU Call Status: Today's TOC FU Call Status:: Unsuccessful Call (1st Attempt) Unsuccessful Call (1st Attempt) Date: 10/16/23  Attempted to reach the patient regarding the most recent Inpatient/ED visit.  Follow Up Plan: Additional outreach attempts will be made to reach the patient to complete the Transitions of Care (Post Inpatient/ED visit) call.   Signature Claretha Crocker, LPN

## 2023-10-17 NOTE — Transitions of Care (Post Inpatient/ED Visit) (Signed)
 I I spoke with pt; pt said was seen at [facility] ED on 10/11/23 with lt side of back;pt has hx of pancreatic pseudocyst'; pt said after testing no fluid or pockets seen. Pt said did not have rt lower quad pain but did have some swelling of abd but again after testing everything was OK. Pt said she is having no problem today but plans to scheduled FU with GI and if pt needs appt with PCP pt will cb. UC & ED precautions given and pt voiced understanding. Sending note to Dr Cherlyn Cornet.      10/17/2023  Name: Mallory Stewart MRN: [id number] DOB: 1961-04-20  Today's TOC FU Call Status: Today's TOC FU Call Status:: Successful TOC FU Call Completed Unsuccessful Call (1st Attempt) Date: 10/16/23 [facility] FU Call Complete Date: 10/17/23 Patient's Name and Date of Birth confirmed.  Transition Care Management Follow-up Telephone Call Date of Discharge: 10/11/23 Discharge Facility: Other ([facility]) Name of Other ([facility]) Discharge Facility: [facility] Type of Discharge: Emergency Department Reason for ED Visit: Other: (pt said was seen [facility] ED n 10/11/23 due to lt side of back and after testing nothing was found wrong no fluid and no pockets. pt said she did not have rt lower quad pain. pt had some abd swelling but testing showed that to be OK.) How have you been since you were released from the hospital?: Better Any questions or concerns?: No  Items Reviewed: Did you receive and understand the discharge instructions provided?: Yes Medications obtained,verified, and reconciled?: No Medications Not Reviewed Reasons::  (pt said no new meds were given to pt at ED.) Any new allergies since your discharge?: No Dietary orders reviewed?: NA Do you have support at home?: Yes People in Home [RPT]: spouse Name of Support/Comfort Primary Source: Dee Farber  Medications Reviewed Today: Medications Reviewed Today   Medications were not reviewed in this encounter     Home Care and  Equipment/Supplies: Were Home Health Services Ordered?: NA Any new equipment or medical supplies ordered?: NA  Functional Questionnaire: Do you need assistance with bathing/showering or dressing?: No Do you need assistance with meal preparation?: No Do you need assistance with eating?: No Do you have difficulty maintaining continence: No Do you need assistance with getting out of bed/getting out of a chair/moving?: No Do you have difficulty managing or taking your medications?: No  Follow up appointments reviewed: PCP Follow-up appointment confirmed?: NA (pt said she would call for appt if needed; pt has no problem now.) Specialist Hospital Follow-up appointment confirmed?: NA (pt said she is going to schedule FU with GI) Do you need transportation to your follow-up appointment?: No Do you understand care options if your condition(s) worsen?: Yes-patient verbalized understanding    SIGNATURE Claretha Crocker, LPN

## 2023-10-17 NOTE — Telephone Encounter (Signed)
 Noted

## 2023-10-18 ENCOUNTER — Telehealth: Payer: Self-pay | Admitting: Family Medicine

## 2023-10-18 NOTE — Telephone Encounter (Signed)
 I spoke with pt and she said that was old call and we spoke yesterday and pt is doing well and nothing further needed.

## 2023-10-18 NOTE — Telephone Encounter (Signed)
 Copied from CRM [id number]. Topic: General - Other >> Oct 16, 2023  4:00 PM Rosamond Comes wrote: Reason for CRM: patient returning a call from Rena Patient phone [phone] ok to leave a detailed message

## 2023-10-29 ENCOUNTER — Ambulatory Visit: Admitting: Physician Assistant

## 2023-11-19 ENCOUNTER — Ambulatory Visit: Admitting: Family Medicine

## 2023-12-03 ENCOUNTER — Ambulatory Visit: Payer: Self-pay

## 2023-12-03 NOTE — Telephone Encounter (Signed)
 FYI Only or Action Required?: FYI only for provider.  Patient was last seen in primary care on 10/07/2023 by Watt Mirza, MD.  Called Nurse Triage reporting Joint Swelling, pain and foot numbness  Symptoms began ongoing - worsening.  Interventions attempted: Other: rest medications has been seen by Dr. Ubaldo.  Symptoms are: gradually worsening.  Triage Disposition: See PCP When Office is Open (Within 3 Days)  Patient/caregiver understands and will follow disposition?: Yes - Pt will go to [facility] today for care. Follow up appt with Dr. Ubaldo scheduled for next week.               Copied from CRM #[id number]. Topic: Clinical - Red Word Triage >> Dec 03, 2023  8:30 AM Donna BRAVO wrote: Red Word that prompted transfer to Nurse Triage: patient right knee swollen, painful, right foot has been numb for several day, painful to touch. Reason for Disposition  MILD or MODERATE swelling (e.g., can't move joint normally, can't do usual activities) (Exceptions: Itchy, localized swelling; swelling is chronic.)  Answer Assessment - Initial Assessment Questions 1. LOCATION: Where is the swelling located?  (e.g., left, right, both knees)     Right knee 2. ONSET: When did the swelling start? Does it come and go, or is it there all the time?     ongoing 3. SWELLING: How bad is the swelling? Or, How large is it? (e.g., mild, moderate, severe; size of localized swelling)      swelling 4. PAIN: Is there any pain? If Yes, ask: How bad is it? (Scale 0-10; or none, mild, moderate, severe)     Yes moderate 5. SETTING: Has there been any recent work, exercise or other activity that involved that part of the body?      Is using the knee 6. AGGRAVATING FACTORS: What makes the knee swelling worse? (e.g., walking, climbing stairs, running)     walking 7. ASSOCIATED SYMPTOMS: Is there any pain or redness?     pain 8. OTHER SYMPTOMS: Do you have any other symptoms? (e.g.,  calf pain, chest pain, difficulty breathing, fever)     Numbness in foot  Protocols used: Knee Swelling-A-AH

## 2023-12-03 NOTE — Telephone Encounter (Signed)
 Noted

## 2023-12-04 ENCOUNTER — Other Ambulatory Visit (INDEPENDENT_AMBULATORY_CARE_PROVIDER_SITE_OTHER): Payer: Self-pay

## 2023-12-04 ENCOUNTER — Ambulatory Visit: Admitting: Family

## 2023-12-04 DIAGNOSIS — R202 Paresthesia of skin: Secondary | ICD-10-CM

## 2023-12-04 DIAGNOSIS — M79671 Pain in right foot: Secondary | ICD-10-CM

## 2023-12-04 DIAGNOSIS — M76821 Posterior tibial tendinitis, right leg: Secondary | ICD-10-CM

## 2023-12-04 DIAGNOSIS — R200 Anesthesia of skin: Secondary | ICD-10-CM

## 2023-12-04 MED ORDER — PREDNISONE 50 MG PO TABS: ORAL_TABLET | ORAL | 0 refills | Status: DC

## 2023-12-04 NOTE — Progress Notes (Signed)
 Office Visit Note   Patient: Mallory Stewart           Date of Birth: Nov 24, 1960           MRN: [id number] Visit Date: 12/04/2023              Requested by: Avelina Greig BRAVO, MD [address] [place],  [place] [address] PCP: Avelina Greig BRAVO, MD  Chief Complaint  Patient presents with   Right Foot - Pain      HPI: The patient is a 63 year old woman who is seen today for concern of pain over the medial column of the right foot is associated with some swelling pain from pressure of her shoewear she has not had any known injury she has been doing some increased high impact aerobics as well as water  aerobics going up on her toes she has had significant pain over the midfoot she is concerned for stress fracture  She also complains of some medial knee pain with significant tenderness some pain radiating in the posterior knee  No significant knee pain no weakness no red flag symptoms  Assessment & Plan: Visit Diagnoses:  1. Pain in right foot     Plan: Posterior tibial tendon dysfunction right  Consideration of possible lumbar radiculopathy right foot I will place her on a course of prednisone  she will follow-up in office in 2 to 4 weeks discussed conservative measures she will continue to use arch supports decrease her high-impact activities anti-inflammatory May use topical anti-inflammatory  Follow-Up Instructions: No follow-ups on file.   Right Ankle Exam   Tenderness  The patient is experiencing no tenderness. Swelling: none  Range of Motion  The patient has normal right ankle ROM.   Comments:  Pain with single limb heel raise on the right   Back Exam   Tenderness  The patient is experiencing no tenderness.   Muscle Strength  The patient has normal back strength.  Tests  Straight leg raise right: negative Straight leg raise left: negative  Other  Erythema: no back redness Scars: absent      Patient is alert, oriented, no adenopathy,  well-dressed, normal affect, normal respiratory effort. TTP along course of PTT and to insertion on navicular     Imaging: No results found. No images are attached to the encounter.  Labs: Lab Results  Component Value Date   HGBA1C 7.3 (A) 06/25/2023   HGBA1C 7.6 (H) 07/03/2022   HGBA1C 7.4 (A) 04/06/2022   ESRSEDRATE 14 01/23/2022   CRP 12.1 (H) 01/23/2022   CRP 6.9 07/09/2017   REPTSTATUS 04/09/2022 FINAL 04/09/2022   REPTSTATUS 04/14/2022 FINAL 04/09/2022   GRAMSTAIN  04/09/2022    WBC PRESENT, PREDOMINANTLY MONONUCLEAR NO ORGANISMS SEEN CYTOSPIN SMEAR Performed at [facility], [address] [place], [place] [address]    CULT  04/09/2022    NO GROWTH 5 DAYS Performed at [facility], [address] [place], [place] [address]    IDOLINA ROA PNEUMONIAE 04/07/2022     Lab Results  Component Value Date   ALBUMIN  4.3 07/03/2022   ALBUMIN  4.0 04/25/2022   ALBUMIN  2.9 (L) 04/10/2022    Lab Results  Component Value Date   MG 1.6 04/25/2022   MG 1.6 (L) 04/10/2022   MG 2.2 04/09/2022   Lab Results  Component Value Date   VD25OH 44.82 07/04/2021   VD25OH 26.51 (L) 03/12/2021   VD25OH 43.63 08/24/2020    No results found for:  PREALBUMIN    Latest Ref Rng & Units 04/25/2022    8:29 AM 04/10/2022    5:19 AM 04/09/2022    4:38 AM  CBC EXTENDED  WBC 4.0 - 10.5 K/uL 5.2  4.9  4.6   RBC 3.87 - 5.11 Mil/uL 4.50  4.10  4.23    4.14   Hemoglobin 12.0 - 15.0 g/dL 87.2  88.4  88.1   HCT 36.0 - 46.0 % 37.7  35.5  36.0   Platelets 150.0 - 400.0 K/uL 195.0  209  205   NEUT# 1.4 - 7.7 K/uL 3.6  3.1  2.9   Lymph# 0.7 - 4.0 K/uL 1.1  1.2  1.2      There is no height or weight on file to calculate BMI.  Orders:  Orders Placed This Encounter  Procedures   XR Foot Complete Right   No orders of the defined types were placed in this encounter.    Procedures: No procedures performed  Clinical Data: No additional findings.  ROS:  All  other systems negative, except as noted in the HPI. Review of Systems  Objective: Vital Signs: There were no vitals taken for this visit.  Specialty Comments:  No specialty comments available.  PMFS History: Patient Active Problem List   Diagnosis Date Noted   Acute midline low back pain without sciatica 09/03/2023   RLQ abdominal pain 10/19/2022   Drug side effects, initial encounter 08/02/2022   Hyponatremia 04/10/2022   Hypomagnesemia 04/10/2022   History of Clostridioides difficile infection 04/10/2022   Normocytic anemia 04/10/2022   Hypoalbuminemia 04/10/2022   Abscess of flank 04/06/2022   Difficult intravenous access 03/15/2022   Clostridioides difficile infection    Decompensated hepatic cirrhosis (HCC)    Abnormal LFTs    Pancreatic duct stricture    Biliary stricture    Pancreatic duct leak    Atrophic pancreas    Alcohol-induced chronic pancreatitis (HCC)    Infected pseudocyst of pancreas    Infected pancreatic pseudocyst    Leakage from tail of pancreas    Colon cancer screening    Acute pancreatic fluid collection    Allergic reaction 01/22/2022   Ascites 01/10/2022   Pancreatic pseudocyst 12/24/2021   Benign positional vertigo, right 11/23/2021   H/O multiple concussions 07/04/2021   Type 2 diabetes mellitus with hyperglycemia, with long-term current use of insulin  (HCC) 03/24/2021   Secondary esophageal varices without bleeding (HCC)    Gastric varices without bleeding 12/06/2020   Portal hypertension (HCC) 09/30/2020   Dilated bile duct 09/30/2020   Alcoholic cirrhosis of liver with ascites (HCC) 09/29/2020   Frequent falls 09/14/2020   Splenomegaly 08/09/2020   Malnutrition of moderate degree (HCC) 08/08/2020   Chronic hyponatremia 08/06/2020   Generalized weakness 08/05/2020   Idiopathic peripheral neuropathy 07/13/2020   Low HDL (under 40) 06/21/2020   Allergic rhinitis 06/21/2020   Duodenitis - edema ? andgioedema 10/30/2018   Chronic  alcoholic pancreatitis (HCC) 09/02/2018   Diabetes mellitus with circulatory complication, HTN (HCC) 08/22/2018   Chronic pain 05/17/2017   Osteoarthritis of spine with radiculopathy, cervical region 09/17/2016   Pancreatic abscess 07/26/2016   Hyperkalemia 03/02/2016   B12 deficiency 10/21/2014   GAD (generalized anxiety disorder) 09/23/2009   Hyperlipidemia associated with type 2 diabetes mellitus (HCC) 11/03/2008   HTN (hypertension) 09/29/2008   Past Medical History:  Diagnosis Date   Alcohol-induced chronic pancreatitis (HCC)    Alcoholic cirrhosis (HCC)    B12 deficiency    Back abscess  C. difficile colitis    Concussion    DDD (degenerative disc disease), cervical    Diabetes (HCC)    DKA, type 2 (HCC)    Gastric outlet obstruction 08/12/2018   Hypertension    Neuropathy    Pancreatic pseudocyst/cyst 05/06/2013   Seasonal allergies     Family History  Problem Relation Age of Onset   Other Mother        [email or web address]   Melanoma Mother    Stroke Father        after hernia suegery   Prostate cancer Father    Other Father        global transient amnesia, unclear source   Atrial fibrillation Sister    Healthy Brother    Healthy Brother    Brain cancer Maternal Grandfather        ?   Coronary artery disease Paternal Grandmother    Heart attack Paternal Grandmother 28   Cancer Paternal Grandfather        ?    Past Surgical History:  Procedure Laterality Date   ANKLE ARTHROSCOPY Right 12/20/2020   Procedure: RIGHT ANKLE ARTHROSCOPIC DEBRIDEMENT;  Surgeon: Harden Jerona GAILS, MD;  Location: [facility];  Service: Orthopedics;  Laterality: Right;   BILIARY BRUSHING  01/27/2022   Procedure: BILIARY BRUSHING;  Surgeon: Wilhelmenia Aloha Raddle., MD;  Location: [facility];  Service: Gastroenterology;;   BIOPSY  01/19/2019   Procedure: BIOPSY;  Surgeon: Wilhelmenia Aloha Raddle., MD;  Location: [facility];  Service: Gastroenterology;;   CHOLECYSTECTOMY  N/A 07/28/2016   Procedure: LAPAROSCOPIC CHOLECYSTECTOMY WITH INTRAOPERATIVE CHOLANGIOGRAM;  Surgeon: Herlene Beverley Bureau, MD;  Location: [facility] OR;  Service: General;  Laterality: N/A;   ERCP N/A 01/27/2022   Procedure: ENDOSCOPIC RETROGRADE CHOLANGIOPANCREATOGRAPHY (ERCP);  Surgeon: Wilhelmenia Aloha Raddle., MD;  Location: [facility];  Service: Gastroenterology;  Laterality: N/A;   ESOPHAGOGASTRODUODENOSCOPY (EGD) WITH PROPOFOL  N/A 08/13/2018   Procedure: ESOPHAGOGASTRODUODENOSCOPY (EGD) WITH PROPOFOL ;  Surgeon: Wilhelmenia Aloha Raddle., MD;  Location: [facility] END[facility]COPY;  Service: Gastroenterology;  Laterality: N/A;   ESOPHAGOGASTRODUODENOSCOPY (EGD) WITH PROPOFOL  N/A 01/19/2019   Procedure: ESOPHAGOGASTRODUODENOSCOPY (EGD) WITH PROPOFOL ;  Surgeon: Wilhelmenia Aloha Raddle., MD;  Location: [facility] ENDOSCOPY;  Service: Gastroenterology;  Laterality: N/A;   ESOPHAGOGASTRODUODENOSCOPY (EGD) WITH PROPOFOL  N/A 12/28/2021   Procedure: ESOPHAGOGASTRODUODENOSCOPY (EGD) WITH PROPOFOL ;  Surgeon: Wilhelmenia Aloha Raddle., MD;  Location: [facility];  Service: Gastroenterology;  Laterality: N/A;   EUS N/A 12/28/2021   Procedure: UPPER ENDOSCOPIC ULTRASOUND (EUS) LINEAR;  Surgeon: Wilhelmenia Aloha Raddle., MD;  Location: [facility];  Service: Gastroenterology;  Laterality: N/A;   INCISION AND DRAINAGE ABSCESS N/A 01/25/2022   Procedure: INCISION AND DRAINAGE BACK ABSCESS;  Surgeon: Bureau, Herlene Beverley, MD;  Location: [facility];  Service: General;  Laterality: N/A;  90 L DOW   IR PARACENTESIS  08/08/2020   IR PARACENTESIS  03/13/2021   IR PARACENTESIS  02/23/2022   IR RADIOLOGIST EVAL & MGMT  04/24/2022   IR US  GUIDE BX ASP/DRAIN  01/23/2022   LEEP  1990's   ORIF FOOT FRACTURE  09/2008   L 5th metatarsal   PANCREATIC STENT PLACEMENT  01/27/2022   Procedure: PANCREATIC STENT PLACEMENT;  Surgeon: Wilhelmenia Aloha Raddle., MD;  Location: [facility];  Service: Gastroenterology;;   REFRACTIVE SURGERY  2000   REMOVAL OF STONES   01/27/2022   Procedure: REMOVAL OF STONES;  Surgeon: Wilhelmenia Aloha Raddle., MD;  Location: [facility];  Service: Gastroenterology;;   ANNETT  01/27/2022   Procedure: SPHINCTEROTOMY;  Surgeon:  Mansouraty, Aloha Raddle., MD;  Location: [facility];  Service: Gastroenterology;;   TIBIA IM NAIL INSERTION Left 03/12/2021   Procedure: INTRAMEDULLARY (IM) NAIL TIBIAL;  Surgeon: Lucilla Lynwood BRAVO, MD;  Location: [facility] OR;  Service: Orthopedics;  Laterality: Left;   TONSILLECTOMY     UPPER ESOPHAGEAL ENDOSCOPIC ULTRASOUND (EUS) N/A 08/13/2018   Procedure: UPPER ESOPHAGEAL ENDOSCOPIC ULTRASOUND (EUS);  Surgeon: Wilhelmenia Aloha Raddle., MD;  Location: [facility] ENDOSCOPY;  Service: Gastroenterology;  Laterality: N/A;   UPPER ESOPHAGEAL ENDOSCOPIC ULTRASOUND (EUS) N/A 01/19/2019   Procedure: UPPER ESOPHAGEAL ENDOSCOPIC ULTRASOUND (EUS);  Surgeon: Wilhelmenia Aloha Raddle., MD;  Location: 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

## 2023-12-10 ENCOUNTER — Encounter: Payer: Self-pay | Admitting: Family

## 2023-12-11 ENCOUNTER — Ambulatory Visit: Admitting: Family Medicine

## 2023-12-11 NOTE — Progress Notes (Signed)
 [facility]  FOLLOW-UP CLINIC VISIT   Referring Physician:  Avram Lupita Loving 12/16/2023  Primary Care Provider: Avram Lupita Loving, MD  Assessment and Plan: Mallory Stewart is a 63 y.o. (DOB: March 10, 1961) female with PD stricture, recurrent acute on chronic pancreatitis, dilated common bile duct, alcoholic cirrhosis c/b ascites, infected pseudocyst with spontaneous pancreaticocutaneous fistula, C Diff.  # PD stricture # Chronic pancreatitis # S/p PD stent, now with 3, most recent ERCP 11/15/22 # Dyspepsia, bloating, abdominal discomfort, concern for IBS/DGBI   Plan: -She absolutely needs the 3 PD stents removed. She understands they cannot stay in place permanently. Recommend she follow locally for this given that she declined scheduling with [facility] due to concerns about cost.  She verbalized understanding of the importance of getting an EGD/ERCP done locally for PD stent removal/follow-up of chronic pancreatitis and plans on doing this.  I will place an order for an external physician EGD, which was printed in the office today, which she will take to her local gastroenterologist -try low FODMAP diet, she has not previously attempted this -omeprazole 40 mg every morning, 30 minutes before food -EGD for stent removal with : Mansouraty, Aloha Raddle., MD locally - MiraLAX  for goal 1-2 bowel movements per day - She will complete her cirrhosis care locally and she verbalized understanding that she is overdue for AFP, needs AFP/right upper quadrant US  every 6 months  Return if symptoms worsen or fail to improve.  She would like to follow-up with [facility] on an as-needed basis as we are out of network  Clem Bruin, MD, PGY-7 Advanced Endoscopy Fellow Division of Gastroenterology __________________________________________________ Reason for Visit: PD stricture, recurrent acute on chronic pancreatitis, and dilated common bile duct.  History of Presenting Illness:   Patient seen in consultation at the request of Dr. Loving Avram, Dr. Thom Hearing for PD stricture, recurrent acute on chronic pancreatitis, and dilated common bile duct.  Patient had her initial ERCP 07/24/2022.  This was performed for pancreatic duct stricture.  The PD in the genu of the pancreas contained a moderate stenosis.  The PD was dilated mildly and upstream of a stenosis.  The main PD was dilated with a 4 mm balloon dilator.  The ventral PD was swept with a HurriCaine dilating balloon starting at the body of the pancreatic duct.  1 stone was removed.  A 7 French by 9 cm plastic pancreatic stent with three quarters external pigtail and a single internal flap was placed into the ventral PD.  One 5 [place] by 8 cm plastic PD stent with three quarters external pigtail and a single intraoral flap was placed 5 cm into the ventral PD and could not be advanced further into the duct.  She was recommended for repeat ERCP in 3 months.  She subsequently had repeat ERCP 11/15/2022.  Previous PD stents were visible on the scout film.  The previous pancreatic sphincterotomy was noted.  PD was cannulated with a regular tip cannula.  The main PD contain a moderate stenosis.  The main PD was dilated with a 4 mm balloon dilator.  One 7 [place] by 5 cm plastic stent with a single 3/4 external flap and a single internal flap was placed into the ventral PD.  She was lost to follow-up.  Natalie Lappas ordered ERCP with stent exchange versus removal on 10/09/2023.  This was initially scheduled for 10/21/2023 with Dr. Minnie.  However, the patient canceled this appointment and never rescheduled.  Schedule office visit  instead.  Today, she informs me that she received a large medical bill after the last 2 ERCPs; this bill was over $10,000 for the last ERCP.  Therefore, she reports she is not interested in repeat ERCPs at [facility].  She believes her local physician is in network and would like to receive any procedures  locally.  She did verbalize understanding of the importance of getting the stents removed after our discussion today.  They cannot stay in place permanently.  She reports to me that her biggest issue today is bloating and not any issues related to the previous abdominal discomfort related to previous PD stricture.  She reports this bloating started this past September 2024.  It has gotten progressively worse.  She feels uncomfortable, feels like it is starting her gait off.  She recently stopped carbonated beverages.  She is moving her bowels with the help of MiraLAX .  Drinking 64 ounces of water  and taking fiber supplementation.  The abdominal discomfort is little bit worse with palpation and she feels tight in her abdomen.  She is taking Lasix  and spironolactone  for her liver cirrhosis and follows locally.  She has been drinking more Kombucha recently.   Review of Systems: The balance of 12 systems reviewed is negative except as noted in the HPI.  __________________________________________________ Problem List: Problem List[1]  Medications: Reviewed in Epic  Vitals:  Vitals:   12/11/23 1052  BP: 170/117  Pulse: 99  Temp: 36.1 C (96.9 F)    Physical Exam:  General appearance: Appears well, no distress. HEENT: Anicteric sclera Cardiovascular: Regular rate Pulmonary: Normal work of breathing. Acyanotic Abdominal: soft, nontender, nondistended, no masses or organomegaly. Musculoskeletal: No temporal wasting. Normal joints of the hand. Skin: No jaundice. No rashes. Neurologic: No asterixis, Alert, oriented, and appropriate. Psychiatric: Appropriate.  Labs: Reviewed in Epic  Imaging CT 10/11/23 - No acute intra-abdominal or pelvic pathology.  - The common bile duct is dilated measuring 2.1 cm with intrahepatic biliary duct dilation. This appears grossly unchanged when compared with MRI from 01/25/2022. Clinical and lab correlation is recommended..  - Findings of chronic  pancreatitis with pancreatic duct stent.  - Undulated liver contours with caudate lobe and left lobe hypertrophy. The findings could be seen secondary to cirrhosis. Mild splenomegaly and small caliber perigastric varices could be suggestive of portal hypertension..    Procedures: ERCP 11/15/22 Impression:            - Two visibly patent plastic stents from the                         pancreatic duct were seen in the major papilla.                        - Prior pancreatic sphincterotomy appeared open.                        - A pancreatic duct stricture was found.                        - The main pancreatic duct was successfully balloon                         dilated.                        - A  plastic stent was placed into the ventral                         pancreatic duct. One 7 Fr by 5 cm plastic stent with       a single 3/4 external flap and single internal flap was placed into the       ventral pancreatic duct. The stent was in good position                        The patient now has 3 side-by-side  plastic stents in place.                        - No specimens collected.  ERCP 07/24/22 Impression:            - Prior pancreatic sphincterotomy appeared open.                        - A pancreatic duct stricture was found. Mild                         dilatation of the main pancreatic duct was found                         upstream of a stenosis.                        - Pancreatic stones were found. Complete removal was                         accomplished.                        - The main pancreatic duct was successfully dilated.                        - The ventral pancreatic duct was swept.                        - Two plastic pancreatic stents were placed into the                         ventral pancreatic duct.                        - No specimens collected.  One 7 Fr by 9 cm plastic pancreatic       stent with a 3/4 external pigtail and a single internal flap was  placed       into the ventral pancreatic duct. The stent was in good position beyond       the stricture in the neck of the pancreas. One 5 Fr by 8 cm plastic       pancreatic stent with a 3/4 external pigtail and a single internal flap       was placed 5 cm into the ventral pancreatic duct and could not be       advanced further into the duct. The final stent position appeared to be       at or just beyond the stricture in the neck of the pancreas.       [1]  There is no problem list on file for this patient.

## 2023-12-19 ENCOUNTER — Telehealth: Payer: Self-pay | Admitting: Family

## 2023-12-19 ENCOUNTER — Ambulatory Visit: Admitting: Sports Medicine

## 2023-12-19 ENCOUNTER — Encounter: Payer: Self-pay | Admitting: Sports Medicine

## 2023-12-19 DIAGNOSIS — M76821 Posterior tibial tendinitis, right leg: Secondary | ICD-10-CM

## 2023-12-19 DIAGNOSIS — R262 Difficulty in walking, not elsewhere classified: Secondary | ICD-10-CM

## 2023-12-19 DIAGNOSIS — Z87311 Personal history of (healed) other pathological fracture: Secondary | ICD-10-CM

## 2023-12-19 DIAGNOSIS — M79671 Pain in right foot: Secondary | ICD-10-CM | POA: Diagnosis not present

## 2023-12-19 DIAGNOSIS — G8929 Other chronic pain: Secondary | ICD-10-CM

## 2023-12-19 MED ORDER — CYCLOBENZAPRINE HCL 10 MG PO TABS: 10.0000 mg | ORAL_TABLET | Freq: Two times a day (BID) | ORAL | 0 refills | Status: DC | PRN

## 2023-12-19 NOTE — Progress Notes (Signed)
 Mallory Stewart - 63 y.o. female MRN [id number]  Date of birth: 1960/06/28  Office Visit Note: Visit Date: 12/19/2023 PCP: Avelina Greig BRAVO, MD Referred by: Avelina Greig BRAVO, MD  Subjective: Chief Complaint  Patient presents with   Right Foot - Pain   HPI: Mallory Stewart is a pleasant 63 y.o. female who presents today for chronic right foot pain.  She has had pain over the medial column of the foot/midfoot for the past 6 weeks or more.  She was seen at our office and placed on oral prednisone .  She also has tried ibuprofen  as well as diclofenac  75 mg twice daily without relief.  Without any significant relief.  She describes a very antalgic gait and has difficulty with any plantarflexion or ambulation.  She does report some weakness with plantarflexion as well.  She has had neuropathy in the past but this feels different.  She has a prior reported history of a left foot stress fracture in years past.  Upon chart review and discussion with Mallie today, she has had 2 fragility fractures from a fall from standing height of both the right ankle/tibia and left leg.  Chart review does show a DEXA from 2022 which was within normal limits.  Pertinent ROS were reviewed with the patient and found to be negative unless otherwise specified above in HPI.   Assessment & Plan: Visit Diagnoses:  1. Chronic foot pain, right   2. Inability to ambulate due to right ankle or foot   3. History of fragility fracture   4. Posterior tibial tendon dysfunction (PTTD) of right lower extremity    Plan: Impression is 6-week history of right medial column/midfoot pain which is progressive and little ability to weightbear.  She has not had any improvement with around-the-clock oral diclofenac  as well as a course of prednisone , so I do not think this is an inflammatory condition in nature.  She has extreme difficulty with weightbearing and does have an antalgic/a functional gait secondary to her pain.  She has a  positive hop test and given her history of previous stress fracture as well as fragility fractures, I am concerned with the stress-fracture or stress reaction.  Given this, we will move forward with MRI of the foot to further evaluate.  In the short-term, she may wear a postop shoe or a short cam walker boot in the interim.  Did recommend she discontinue Voltaren  75 mg orally as this has not been helpful.   Follow-up: Return for f/u 1 -week after MRI to review and discuss next steps .   Meds & Orders:  Meds ordered this encounter  Medications   cyclobenzaprine  (FLEXERIL ) 10 MG tablet    Sig: Take 1 tablet (10 mg total) by mouth 2 (two) times daily as needed for muscle spasms (Pain).    Dispense:  30 tablet    Refill:  0    Orders Placed This Encounter  Procedures   MR Foot Right w/o contrast     Procedures: No procedures performed      Clinical History: No specialty comments available.  She reports that she has never smoked. She has never used smokeless tobacco.  Recent Labs    06/25/23 1012  HGBA1C 7.3*   Narrative & Impression  Date of study: 10/12/2020 Exam: DUAL X-RAY ABSORPTIOMETRY (DXA) FOR BONE MINERAL DENSITY (BMD) Instrument: [employer] Requesting Provider: Dr. Avram Indication: screening for osteoporosis Comparison: none (please note that it is not possible to compare  data from different instruments) Clinical data: Pt is a 63 y.o. female with previous ankle fracture.  On calcium and vitamin D.   Results:   Lumbar spine L1-L4 Femoral neck (FN) 33% distal radius  T-score  +3.4 RFN: +0.6 LFN: +0.9 n/a    Assessment: the BMD is normal according to the [facility] classification for osteoporosis (see below).  Fracture risk: low FRAX score: not calculated due to normal BMD Comments: the technical quality of the study is good Recommend optimizing calcium (1200 mg/day) and vitamin D (800 IU/day) intake.  No pharmacological treatment is indicated. Followup: Repeat  BMD is appropriate after 2 years.   WHO criteria for diagnosis of osteoporosis in postmenopausal women and in men 70 y/o or older:  - normal: T-score -1.0 to + 1.0 - osteopenia/low bone density: T-score between -2.5 and -1.0 - osteoporosis: T-score below -2.5 - severe osteoporosis: T-score below -2.5 with history of fragility fracture Note: although not part of the WHO classification, the presence of a fragility fracture, regardless of the T-score, should be considered diagnostic of osteoporosis, provided other causes for the fracture have been excluded.   Lela Fendt, MD [facility]     Objective:    Physical Exam  Gen: Well-appearing, in no acute distress; non-toxic CV: Well-perfused. Warm.  Resp: Breathing unlabored on room air; no wheezing. Psych: Fluid speech in conversation; appropriate affect; normal thought process  Ortho Exam - Right foot/ankle: Well-healed incisions at the distal tibia/proximal ankle from prior ORIF.  There is no ankle effusion, there is mild soft tissue swelling over the medial metatarsal region.  There is rather significant tenderness to palpation over the 1st and 2nd metatarsal and the underlying plantar arch.  No calcaneal pain or heel squeeze.  There is mild TTP palpating along the course of the posterior tibial tendon.  There is pain with plantarflexion but more so pain with weightbearing as she has quite difficult time putting pressure on the mid and forefoot.  Positive antalgic gait.  Imaging:  *Three-view x-ray of the right foot from 12/04/2023 was independently reviewed and interpreted by myself today.  X-ray shows only mild degenerative changes through the midfoot, there is posttraumatic ankle arthritic change from previous ankle hardware and tibial nailing.  *I did review the knee x-ray today during the visit.  Narrative & Impression  CLINICAL DATA:  Medial knee pain.   EXAM: RIGHT KNEE - COMPLETE 4+ VIEW   COMPARISON:  None  Available.   FINDINGS: Standing view both knees with standing tunnel, lateral patellar views of the right knee obtained. The alignment and joint spaces are normal. There is minor patellofemoral spurring. Trace spurring of the tibial spines. Small quadriceps tendon enthesophyte. No fracture, erosion, or suspicious bone lesion. No joint effusion. Vascular calcifications are seen.   Frontal view of the left knee demonstrates tibial intramedullary nail, intact where included.   IMPRESSION: 1. Minor patellofemoral spurring. 2. Small quadriceps tendon enthesophyte.     Electronically Signed   By: Andrea Gasman M.D.   On: 10/12/2023 12:18    Past Medical/Family/Surgical/Social History: Medications & Allergies reviewed per EMR, new medications updated. Patient Active Problem List   Diagnosis Date Noted   Acute midline low back pain without sciatica 09/03/2023   RLQ abdominal pain 10/19/2022   Drug side effects, initial encounter 08/02/2022   Hyponatremia 04/10/2022   Hypomagnesemia 04/10/2022   History of Clostridioides difficile infection 04/10/2022   Normocytic anemia 04/10/2022   Hypoalbuminemia 04/10/2022   Abscess of flank 04/06/2022  Difficult intravenous access 03/15/2022   Clostridioides difficile infection    Decompensated hepatic cirrhosis (HCC)    Abnormal LFTs    Pancreatic duct stricture    Biliary stricture    Pancreatic duct leak    Atrophic pancreas    Alcohol-induced chronic pancreatitis (HCC)    Infected pseudocyst of pancreas    Infected pancreatic pseudocyst    Leakage from tail of pancreas    Colon cancer screening    Acute pancreatic fluid collection    Allergic reaction 01/22/2022   Ascites 01/10/2022   Pancreatic pseudocyst 12/24/2021   Benign positional vertigo, right 11/23/2021   H/O multiple concussions 07/04/2021   Type 2 diabetes mellitus with hyperglycemia, with long-term current use of insulin  (HCC) 03/24/2021   Secondary esophageal  varices without bleeding (HCC)    Gastric varices without bleeding 12/06/2020   Portal hypertension (HCC) 09/30/2020   Dilated bile duct 09/30/2020   Alcoholic cirrhosis of liver with ascites (HCC) 09/29/2020   Frequent falls 09/14/2020   Splenomegaly 08/09/2020   Malnutrition of moderate degree (HCC) 08/08/2020   Chronic hyponatremia 08/06/2020   Generalized weakness 08/05/2020   Idiopathic peripheral neuropathy 07/13/2020   Low HDL (under 40) 06/21/2020   Allergic rhinitis 06/21/2020   Duodenitis - edema ? andgioedema 10/30/2018   Chronic alcoholic pancreatitis (HCC) 09/02/2018   Diabetes mellitus with circulatory complication, HTN (HCC) 08/22/2018   Chronic pain 05/17/2017   Osteoarthritis of spine with radiculopathy, cervical region 09/17/2016   Pancreatic abscess 07/26/2016   Hyperkalemia 03/02/2016   B12 deficiency 10/21/2014   GAD (generalized anxiety disorder) 09/23/2009   Hyperlipidemia associated with type 2 diabetes mellitus (HCC) 11/03/2008   HTN (hypertension) 09/29/2008   Past Medical History:  Diagnosis Date   Alcohol-induced chronic pancreatitis (HCC)    Alcoholic cirrhosis (HCC)    B12 deficiency    Back abscess    C. difficile colitis    Concussion    DDD (degenerative disc disease), cervical    Diabetes (HCC)    DKA, type 2 (HCC)    Gastric outlet obstruction 08/12/2018   Hypertension    Neuropathy    Pancreatic pseudocyst/cyst 05/06/2013   Seasonal allergies    Family History  Problem Relation Age of Onset   Other Mother        tachycardia.[email or web address]?afib   Melanoma Mother    Stroke Father        after hernia suegery   Prostate cancer Father    Other Father        global transient amnesia, unclear source   Atrial fibrillation Sister    Healthy Brother    Healthy Brother    Brain cancer Maternal Grandfather        ?   Coronary artery disease Paternal Grandmother    Heart attack Paternal Grandmother 1   Cancer Paternal Grandfather        ?    Past Surgical History:  Procedure Laterality Date   ANKLE ARTHROSCOPY Right 12/20/2020   Procedure: RIGHT ANKLE ARTHROSCOPIC DEBRIDEMENT;  Surgeon: Harden Jerona GAILS, MD;  Location: [facility];  Service: Orthopedics;  Laterality: Right;   BILIARY BRUSHING  01/27/2022   Procedure: BILIARY BRUSHING;  Surgeon: Wilhelmenia Aloha Raddle., MD;  Location: [facility];  Service: Gastroenterology;;   BIOPSY  01/19/2019   Procedure: BIOPSY;  Surgeon: Wilhelmenia Aloha Raddle., MD;  Location: [facility] ENDOSCOPY;  Service: Gastroenterology;;   CHOLECYSTECTOMY N/A 07/28/2016   Procedure: LAPAROSCOPIC CHOLECYSTECTOMY WITH INTRAOPERATIVE CHOLANGIOGRAM;  Surgeon: Herlene Righter Kinsinger,  MD;  Location: [facility];  Service: General;  Laterality: N/A;   ERCP N/A 01/27/2022   Procedure: ENDOSCOPIC RETROGRADE CHOLANGIOPANCREATOGRAPHY (ERCP);  Surgeon: Wilhelmenia Aloha Raddle., MD;  Location: [facility];  Service: Gastroenterology;  Laterality: N/A;   ESOPHAGOGASTRODUODENOSCOPY (EGD) WITH PROPOFOL  N/A 08/13/2018   Procedure: ESOPHAGOGASTRODUODENOSCOPY (EGD) WITH PROPOFOL ;  Surgeon: Wilhelmenia Aloha Raddle., MD;  Location: [facility]COPY;  Service: Gastroenterology;  Laterality: N/A;   ESOPHAGOGASTRODUODENOSCOPY (EGD) WITH PROPOFOL  N/A 01/19/2019   Procedure: ESOPHAGOGASTRODUODENOSCOPY (EGD) WITH PROPOFOL ;  Surgeon: Wilhelmenia Aloha Raddle., MD;  Location: [facility]COPY;  Service: Gastroenterology;  Laterality: N/A;   ESOPHAGOGASTRODUODENOSCOPY (EGD) WITH PROPOFOL  N/A 12/28/2021   Procedure: ESOPHAGOGASTRODUODENOSCOPY (EGD) WITH PROPOFOL ;  Surgeon: Wilhelmenia Aloha Raddle., MD;  Location: [facility];  Service: Gastroenterology;  Laterality: N/A;   EUS N/A 12/28/2021   Procedure: UPPER ENDOSCOPIC ULTRASOUND (EUS) LINEAR;  Surgeon: Wilhelmenia Aloha Raddle., MD;  Location: [facility];  Service: Gastroenterology;  Laterality: N/A;   INCISION AND DRAINAGE ABSCESS N/A 01/25/2022   Procedure: INCISION AND DRAINAGE BACK ABSCESS;   Surgeon: Stevie, Herlene Righter, MD;  Location: [facility];  Service: General;  Laterality: N/A;  90 L DOW   IR PARACENTESIS  08/08/2020   IR PARACENTESIS  03/13/2021   IR PARACENTESIS  02/23/2022   IR RADIOLOGIST EVAL & MGMT  04/24/2022   IR US  GUIDE BX ASP/DRAIN  01/23/2022   LEEP  1990's   ORIF FOOT FRACTURE  09/2008   L 5th metatarsal   PANCREATIC STENT PLACEMENT  01/27/2022   Procedure: PANCREATIC STENT PLACEMENT;  Surgeon: Wilhelmenia Aloha Raddle., MD;  Location: [facility];  Service: Gastroenterology;;   REFRACTIVE SURGERY  2000   REMOVAL OF STONES  01/27/2022   Procedure: REMOVAL OF STONES;  Surgeon: Wilhelmenia Aloha Raddle., MD;  Location: [facility];  Service: Gastroenterology;;   ANNETT  01/27/2022   Procedure: ANNETT;  Surgeon: Wilhelmenia Aloha Raddle., MD;  Location: [facility];  Service: Gastroenterology;;   TIBIA IM NAIL INSERTION Left 03/12/2021   Procedure: INTRAMEDULLARY (IM) NAIL TIBIAL;  Surgeon: Lucilla Lynwood BRAVO, MD;  Location: [facility];  Service: Orthopedics;  Laterality: Left;   TONSILLECTOMY     UPPER ESOPHAGEAL ENDOSCOPIC ULTRASOUND (EUS) N/A 08/13/2018   Procedure: UPPER ESOPHAGEAL ENDOSCOPIC ULTRASOUND (EUS);  Surgeon: Wilhelmenia Aloha Raddle., MD;  Location: [facility] ENDOSCOPY;  Service: Gastroenterology;  Laterality: N/A;   UPPER ESOPHAGEAL ENDOSCOPIC ULTRASOUND (EUS) N/A 01/19/2019   Procedure: UPPER ESOPHAGEAL ENDOSCOPIC ULTRASOUND (EUS);  Surgeon: Wilhelmenia Aloha Raddle., MD;  Location: [facility] ENDOSCOPY;  Service: Gastroenterology;  Laterality: N/A;   Social History   Occupational History   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

## 2023-12-19 NOTE — Telephone Encounter (Signed)
 Patient called and ask if she could get something for pain. RA#[phone]

## 2023-12-20 ENCOUNTER — Encounter: Payer: Self-pay | Admitting: Gastroenterology

## 2023-12-20 ENCOUNTER — Telehealth: Payer: Self-pay | Admitting: *Deleted

## 2023-12-20 ENCOUNTER — Other Ambulatory Visit: Payer: Self-pay

## 2023-12-20 DIAGNOSIS — T85528A Displacement of other gastrointestinal prosthetic devices, implants and grafts, initial encounter: Secondary | ICD-10-CM

## 2023-12-20 DIAGNOSIS — K8689 Other specified diseases of pancreas: Secondary | ICD-10-CM

## 2023-12-20 MED ORDER — HYDROCODONE-ACETAMINOPHEN 5-325 MG PO TABS: 1.0000 | ORAL_TABLET | Freq: Four times a day (QID) | ORAL | 0 refills | Status: DC | PRN

## 2023-12-20 NOTE — Telephone Encounter (Signed)
-----   Message from Lonell Sprang sent at 12/19/2023  5:13 PM EDT ----- Regarding: Help with urgent MRI Hey team,  I was wondering if we could help expedite this foot MRI.  This is not quite a STAT need, but if she could get this within a week, would be appreciative as she currently is unable to weight-bear. Let me know what you need from me, order has been placed.  Thank yoU! Dana

## 2023-12-20 NOTE — Progress Notes (Signed)
 Will wait for the Nov schedule to come out X ray order has been entered

## 2023-12-20 NOTE — Telephone Encounter (Signed)
 I called pt left vm to return my call to see if she would like to have imaging done at [facility] and if she would like to have done tomorrow (Saturday) or Monday the 18 pending call back from pt. I need if this is ok before can get PA with insurance.

## 2023-12-20 NOTE — Addendum Note (Signed)
 Addended by: Jonluke Cobbins R on: 12/20/2023 06:45 AM   Modules accepted: Orders

## 2023-12-20 NOTE — Progress Notes (Unsigned)
 Review of outside records from recent [facility] follow-up  Patient has multiple PD stents after 11/15/2022 ERCP. Would like to have this procedure done locally for stent removal and evaluation of pancreatic duct. Have discussed with her primary GI, Dr. Avram, and I am open to seeing this patient locally for her care as have seen her in the past. I would like the patient to undergo a 2 view KUB here in town.  Will forward to my team to work on scheduling ERCP would likely be in October or November based on availability.  Aloha Finner, MD [facility] Advanced Endoscopy Office # [phone]

## 2023-12-20 NOTE — Progress Notes (Signed)
 The pt has been advised She will come in for xray as soon as able She will be contacted when the Nov schedule is out

## 2023-12-23 ENCOUNTER — Telehealth: Payer: Self-pay | Admitting: Physician Assistant

## 2023-12-23 ENCOUNTER — Other Ambulatory Visit: Payer: Self-pay | Admitting: Sports Medicine

## 2023-12-23 NOTE — Telephone Encounter (Signed)
 Pt called stating PA Erin sent hydrocodone  in to her pharmacy. Pt states she can not take hydro and did not pick up med. Pt states she can tolerate tramadol . Please send to [facility]  on [address] in [place]. Pt phone number is [phone].

## 2023-12-23 NOTE — Telephone Encounter (Signed)
 This patient lvm on 12/20/23 at 12:59pm stating she was returning Sabrina's call regarding a MRI.  [phone]

## 2023-12-23 NOTE — Telephone Encounter (Signed)
 Spoke with pt and she is ok to have done at [facility].I called [facility] spoke with Bernice and she is going to contact pt now to get her scheduled for this week.

## 2023-12-24 MED ORDER — TRAMADOL HCL 50 MG PO TABS: 50.0000 mg | ORAL_TABLET | Freq: Four times a day (QID) | ORAL | 0 refills | Status: AC | PRN

## 2023-12-24 NOTE — Telephone Encounter (Signed)
 sent

## 2023-12-30 ENCOUNTER — Ambulatory Visit
Admission: RE | Admit: 2023-12-30 | Discharge: 2023-12-30 | Disposition: A | Discharge status: Home / Self Care | Source: Ambulatory Visit | Attending: Sports Medicine | Admitting: Sports Medicine

## 2023-12-30 DIAGNOSIS — R262 Difficulty in walking, not elsewhere classified: Secondary | ICD-10-CM

## 2023-12-30 DIAGNOSIS — G8929 Other chronic pain: Secondary | ICD-10-CM

## 2023-12-30 DIAGNOSIS — Z87311 Personal history of (healed) other pathological fracture: Secondary | ICD-10-CM

## 2024-01-22 ENCOUNTER — Other Ambulatory Visit: Payer: Self-pay

## 2024-01-22 ENCOUNTER — Telehealth: Payer: Self-pay

## 2024-01-22 DIAGNOSIS — K8689 Other specified diseases of pancreas: Secondary | ICD-10-CM

## 2024-01-22 DIAGNOSIS — K863 Pseudocyst of pancreas: Secondary | ICD-10-CM

## 2024-01-22 DIAGNOSIS — K860 Alcohol-induced chronic pancreatitis: Secondary | ICD-10-CM

## 2024-01-22 NOTE — Telephone Encounter (Signed)
-----   Message from Nurse Dashonda Bonneau P sent at 12/20/2023  2:07 PM EDT ----- I would like this patient to be scheduled for in November ERCP.

## 2024-01-22 NOTE — Telephone Encounter (Signed)
ERCP scheduled, pt instructed and medications reviewed.  Patient instructions mailed to home.  Patient to call with any questions or concerns.  

## 2024-01-22 NOTE — Telephone Encounter (Signed)
 Left message on machine to call back    ERCP has been set up for 03/19/24 at 8 am at [facility] with GM

## 2024-03-09 ENCOUNTER — Encounter: Payer: Self-pay | Admitting: Radiology

## 2024-03-11 ENCOUNTER — Telehealth: Payer: Self-pay | Admitting: Gastroenterology

## 2024-03-11 NOTE — Telephone Encounter (Addendum)
 Procedure:ERCP Procedure date: 03/19/24 Procedure location: [facility] Arrival Time: 6:00 am Spoke with the patient Y/N: Yes Any prep concerns? No  Has the patient obtained the prep from the pharmacy ? No prep needed Do you have a care partner and transportation: Yes Any additional concerns? No

## 2024-03-12 ENCOUNTER — Encounter (HOSPITAL_COMMUNITY): Payer: Self-pay | Admitting: Gastroenterology

## 2024-03-12 NOTE — Progress Notes (Signed)
 Attempted to obtain medical history for pre op call via telephone, unable to reach at this time. HIPAA compliant voicemail message left requesting return call to pre surgical testing department.

## 2024-03-16 NOTE — Telephone Encounter (Signed)
 Noted

## 2024-03-16 NOTE — Telephone Encounter (Signed)
 The pt and the team (per pt) have spoken with the insurance. It is her plan that she has.

## 2024-03-16 NOTE — Telephone Encounter (Signed)
 Although not ideal to continue to have the stents in place, if she is doing OK without progressive symptoms, then I think we do it as soon as we can schedule. My question, is why is this not being covered (at all by insurance); do we know that the Insurance has all the documentation/reasoning for this to be performed, because if not, we need to expedite to see if she can still have the procedure done and it be covered? Can you forward this to the prior authorization team? Thanks. GM  FYI CEG on your patient.

## 2024-03-16 NOTE — Telephone Encounter (Signed)
 The pt has been moved to 05/11/24 at 8 am   The pt has been advised of the new date, time and instructions

## 2024-03-16 NOTE — Telephone Encounter (Signed)
 The update. Lets try to bring the last patient in the day as our first patient this will help today continue to move if possible. Then it allows some potential opportunity for inpatients if they are needed. Thanks. GM

## 2024-03-16 NOTE — Telephone Encounter (Signed)
 Can you please review for insurance purposes.

## 2024-03-16 NOTE — Telephone Encounter (Signed)
 Dr Wilhelmenia the pt states that she would like to push her procedure out to Jan due to her insurance not paying anything on the ERCP.  She will be responsible for 100 %.  Is this safe ?

## 2024-03-17 NOTE — Telephone Encounter (Signed)
 No worries. We understand that finances are an important aspect of the care patients receive. I see she is rescheduled. She certainly needs the stents worked on/removed at some point. Thanks. GM

## 2024-03-17 NOTE — Telephone Encounter (Signed)
 Dr Wilhelmenia see the message regarding her high deductible.

## 2024-04-14 ENCOUNTER — Ambulatory Visit: Payer: Self-pay

## 2024-04-14 NOTE — Telephone Encounter (Signed)
 Next Appt With Family Medicine Karene Blas, MD) 04/15/2024 at 8:30 AM

## 2024-04-14 NOTE — Telephone Encounter (Signed)
 Noted. Will see tomorrow.
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
## 2024-04-15 ENCOUNTER — Ambulatory Visit
Admission: RE | Admit: 2024-04-15 | Discharge: 2024-04-15 | Disposition: A | Discharge status: Home / Self Care | Source: Ambulatory Visit | Attending: Family Medicine | Admitting: Family Medicine

## 2024-04-15 ENCOUNTER — Encounter: Payer: Self-pay | Admitting: Family Medicine

## 2024-04-15 ENCOUNTER — Ambulatory Visit: Admitting: Family Medicine

## 2024-04-15 VITALS — BP 158/90 | HR 93 | Temp 97.5°F | Ht 68.0 in | Wt 160.8 lb

## 2024-04-15 DIAGNOSIS — S4991XA Unspecified injury of right shoulder and upper arm, initial encounter: Secondary | ICD-10-CM

## 2024-04-15 DIAGNOSIS — M25531 Pain in right wrist: Secondary | ICD-10-CM | POA: Diagnosis not present

## 2024-04-15 MED ORDER — DICLOFENAC SODIUM 75 MG PO TBEC: 75.0000 mg | DELAYED_RELEASE_TABLET | Freq: Two times a day (BID) | ORAL | 0 refills | Status: AC

## 2024-04-15 MED ORDER — METHOCARBAMOL 500 MG PO TABS: 500.0000 mg | ORAL_TABLET | Freq: Three times a day (TID) | ORAL | 0 refills | Status: DC | PRN

## 2024-04-15 NOTE — Patient Instructions (Signed)
 R shoulder xray today  Take voltaren  topically as well as oral anti inflammatory pills - take with food I will also prescribe robaxin  muscle relaxant. Ice/heating pad use. Let us  know if not improving with treatment as expected.
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
## 2024-04-15 NOTE — Assessment & Plan Note (Signed)
 Suspect possible dequervain's tenosynovitis Rec topical voltaren , oral voltaren  course.
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
## 2024-04-21 ENCOUNTER — Ambulatory Visit: Payer: Self-pay | Admitting: Family Medicine
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
## 2024-04-28 ENCOUNTER — Telehealth: Payer: Self-pay | Admitting: Gastroenterology

## 2024-04-28 NOTE — Telephone Encounter (Signed)
 Appt moved to 05/28/24 at 245 pm.  New instructions given.  She will call back if there are any further issues

## 2024-04-28 NOTE — Telephone Encounter (Signed)
 Inbound call from patient stating she would like to reschedule upcoming procedure she has scheduled at [facility] on 05/11/24 due to changing insurances and not having enough time to review and authorize procedure. Patient stated she also discussed this with Dr.Mansouraty.  Please advise  Thank you
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
## 2024-05-04 ENCOUNTER — Telehealth: Payer: Self-pay

## 2024-05-04 ENCOUNTER — Ambulatory Visit: Admitting: Family Medicine

## 2024-05-04 DIAGNOSIS — S4991XD Unspecified injury of right shoulder and upper arm, subsequent encounter: Secondary | ICD-10-CM

## 2024-05-04 NOTE — Telephone Encounter (Signed)
 Thanks for update. GM

## 2024-05-04 NOTE — Telephone Encounter (Signed)
 Procedure:ERCP Procedure date: 05/12/23 Procedure location: [facility] Arrival Time: 6:39 Spoke with the patient Y/N: Y Any prep concerns? N Has the patient obtained the prep from the pharmacy ? N Do you have a care partner and transportation: N Any additional concerns? N   I called patient and patient stated to me that her procedure need to be rescheduled.

## 2024-05-04 NOTE — Telephone Encounter (Signed)
 Spoke with the patient. She is rescheduled to 05/28/24.

## 2024-05-11 ENCOUNTER — Ambulatory Visit: Payer: Self-pay
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
## 2024-05-14 ENCOUNTER — Ambulatory Visit (INDEPENDENT_AMBULATORY_CARE_PROVIDER_SITE_OTHER): Admitting: Family Medicine

## 2024-05-14 ENCOUNTER — Encounter: Payer: Self-pay | Admitting: Family Medicine

## 2024-05-14 ENCOUNTER — Encounter (HOSPITAL_COMMUNITY): Payer: Self-pay | Admitting: Gastroenterology

## 2024-05-14 VITALS — BP 130/72 | HR 90 | Temp 98.9°F | Ht 68.0 in | Wt 162.1 lb

## 2024-05-14 DIAGNOSIS — S4991XA Unspecified injury of right shoulder and upper arm, initial encounter: Secondary | ICD-10-CM

## 2024-05-14 DIAGNOSIS — R29898 Other symptoms and signs involving the musculoskeletal system: Secondary | ICD-10-CM

## 2024-05-14 DIAGNOSIS — S46011A Strain of muscle(s) and tendon(s) of the rotator cuff of right shoulder, initial encounter: Secondary | ICD-10-CM

## 2024-05-19 ENCOUNTER — Telehealth: Payer: Self-pay | Admitting: Gastroenterology

## 2024-05-19 NOTE — Telephone Encounter (Signed)
 Procedure:ERCP Procedure date: 05/28/24 Procedure location: [facility] Arrival Time: 1:15 pm Spoke with the patient Y/N: Yes Any prep concerns? No  Has the patient obtained the prep from the pharmacy ? No prep needed Do you have a care partner and transportation: Yes Any additional concerns? No

## 2024-05-22 ENCOUNTER — Ambulatory Visit: Payer: Self-pay | Admitting: Family Medicine

## 2024-05-22 ENCOUNTER — Ambulatory Visit
Admission: RE | Admit: 2024-05-22 | Discharge: 2024-05-22 | Disposition: A | Discharge status: Home / Self Care | Source: Ambulatory Visit | Attending: Family Medicine | Admitting: Family Medicine

## 2024-05-22 DIAGNOSIS — R29898 Other symptoms and signs involving the musculoskeletal system: Secondary | ICD-10-CM | POA: Diagnosis present

## 2024-05-22 DIAGNOSIS — S4991XA Unspecified injury of right shoulder and upper arm, initial encounter: Secondary | ICD-10-CM | POA: Diagnosis present

## 2024-05-22 DIAGNOSIS — M25511 Pain in right shoulder: Secondary | ICD-10-CM | POA: Diagnosis present

## 2024-05-22 DIAGNOSIS — M19011 Primary osteoarthritis, right shoulder: Secondary | ICD-10-CM | POA: Diagnosis not present

## 2024-05-22 DIAGNOSIS — S46011A Strain of muscle(s) and tendon(s) of the rotator cuff of right shoulder, initial encounter: Secondary | ICD-10-CM | POA: Diagnosis not present

## 2024-05-25 NOTE — Addendum Note (Signed)
 Addended by: WATT MIRZA on: 05/25/2024 11:11 AM   Modules accepted: Orders
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
## 2024-05-27 ENCOUNTER — Ambulatory Visit: Admitting: Physical Therapy

## 2024-05-27 ENCOUNTER — Telehealth: Payer: Self-pay | Admitting: Gastroenterology

## 2024-05-27 NOTE — Telephone Encounter (Signed)
 Can you help set her up with a different physical therapy office?  Thanks!
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
## 2024-05-28 ENCOUNTER — Ambulatory Visit (HOSPITAL_COMMUNITY): Admission: RE | Admit: 2024-05-28 | Source: Home / Self Care | Admitting: Gastroenterology

## 2024-05-28 ENCOUNTER — Encounter (HOSPITAL_COMMUNITY): Admission: RE | Payer: Self-pay | Source: Home / Self Care

## 2024-05-28 ENCOUNTER — Ambulatory Visit: Admitting: Internal Medicine

## 2024-05-28 SURGERY — ERCP, WITH INTERVENTION IF INDICATED
Anesthesia: General

## 2024-05-28 NOTE — Telephone Encounter (Signed)
 No worries.  Thanks for helping as you can. I just found out about this patient's cancellation today because we were trying to call her in to come in sooner and then saw the notation from yesterday.  Rovonda, If you can find a repeat slot for ERCP for this patient in the next few months that is okay with me. Thanks. GM  FYI CEG on your patient canceling ERCP but hopefully rescheduling.
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
## 2024-06-03 ENCOUNTER — Telehealth: Admitting: Internal Medicine

## 2024-06-03 ENCOUNTER — Telehealth: Payer: Self-pay | Admitting: Internal Medicine

## 2024-06-03 ENCOUNTER — Encounter: Payer: Self-pay | Admitting: Internal Medicine

## 2024-06-03 VITALS — Ht 68.0 in | Wt 162.0 lb

## 2024-06-03 DIAGNOSIS — E1165 Type 2 diabetes mellitus with hyperglycemia: Secondary | ICD-10-CM

## 2024-06-03 DIAGNOSIS — Z794 Long term (current) use of insulin: Secondary | ICD-10-CM | POA: Diagnosis not present

## 2024-06-03 DIAGNOSIS — E1142 Type 2 diabetes mellitus with diabetic polyneuropathy: Secondary | ICD-10-CM | POA: Diagnosis not present

## 2024-06-03 MED ORDER — INSULIN PEN NEEDLE 32G X 4 MM MISC: 1.0000 | Freq: Four times a day (QID) | 2 refills | Status: AC

## 2024-06-03 MED ORDER — INSULIN LISPRO (1 UNIT DIAL) 100 UNIT/ML (KWIKPEN): 8.0000 [IU] | PEN_INJECTOR | Freq: Three times a day (TID) | SUBCUTANEOUS | 2 refills | Status: AC

## 2024-06-03 MED ORDER — BASAGLAR KWIKPEN 100 UNIT/ML ~~LOC~~ SOPN: 20.0000 [IU] | PEN_INJECTOR | Freq: Every day | SUBCUTANEOUS | 3 refills | Status: AC

## 2024-06-03 NOTE — Telephone Encounter (Signed)
 Please contact the pt and schedule her for a follow visit (in person) in 4 months     Thanks

## 2024-06-03 NOTE — Progress Notes (Signed)
 Glucose readings from glucometer [phone]
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
## 2024-06-09 ENCOUNTER — Ambulatory Visit: Admitting: Physical Therapy

## 2024-06-11 ENCOUNTER — Ambulatory Visit: Admitting: Physical Therapy

## 2024-06-17 ENCOUNTER — Ambulatory Visit: Admitting: Physical Therapy

## 2024-06-23 ENCOUNTER — Ambulatory Visit: Admitting: Physical Therapy

## 2024-06-25 ENCOUNTER — Ambulatory Visit: Admitting: Physical Therapy

## 2024-07-01 ENCOUNTER — Ambulatory Visit: Admitting: Physical Therapy

## 2024-07-06 ENCOUNTER — Ambulatory Visit: Admitting: Physical Therapy

## 2024-07-09 ENCOUNTER — Ambulatory Visit: Admitting: Physical Therapy

## 2024-09-30 ENCOUNTER — Ambulatory Visit: Admitting: Internal Medicine
